# Patient Record
Sex: Female | Born: 1970 | Hispanic: Yes | Smoking: Never smoker
Health system: Southern US, Community
[De-identification: ages and names within clinical notes are randomized; demographics above are authoritative.]

## PROBLEM LIST (undated history)

## (undated) DIAGNOSIS — C50919 Malignant neoplasm of unspecified site of unspecified female breast: Secondary | ICD-10-CM

## (undated) DIAGNOSIS — E1129 Type 2 diabetes mellitus with other diabetic kidney complication: Secondary | ICD-10-CM

## (undated) DIAGNOSIS — Z794 Long term (current) use of insulin: Secondary | ICD-10-CM

## (undated) DIAGNOSIS — F32A Depression, unspecified: Secondary | ICD-10-CM

## (undated) DIAGNOSIS — G4730 Sleep apnea, unspecified: Secondary | ICD-10-CM

## (undated) DIAGNOSIS — G709 Myoneural disorder, unspecified: Secondary | ICD-10-CM

## (undated) DIAGNOSIS — I10 Essential (primary) hypertension: Secondary | ICD-10-CM

## (undated) DIAGNOSIS — Z9289 Personal history of other medical treatment: Secondary | ICD-10-CM

## (undated) DIAGNOSIS — T7840XA Allergy, unspecified, initial encounter: Secondary | ICD-10-CM

## (undated) DIAGNOSIS — N186 End stage renal disease: Secondary | ICD-10-CM

## (undated) DIAGNOSIS — D649 Anemia, unspecified: Secondary | ICD-10-CM

## (undated) DIAGNOSIS — K219 Gastro-esophageal reflux disease without esophagitis: Secondary | ICD-10-CM

## (undated) DIAGNOSIS — H269 Unspecified cataract: Secondary | ICD-10-CM

## (undated) DIAGNOSIS — T8859XA Other complications of anesthesia, initial encounter: Secondary | ICD-10-CM

## (undated) DIAGNOSIS — E785 Hyperlipidemia, unspecified: Secondary | ICD-10-CM

## (undated) DIAGNOSIS — IMO0001 Reserved for inherently not codable concepts without codable children: Secondary | ICD-10-CM

## (undated) DIAGNOSIS — I2510 Atherosclerotic heart disease of native coronary artery without angina pectoris: Secondary | ICD-10-CM

## (undated) DIAGNOSIS — Z940 Kidney transplant status: Secondary | ICD-10-CM

## (undated) DIAGNOSIS — F329 Major depressive disorder, single episode, unspecified: Secondary | ICD-10-CM

## (undated) DIAGNOSIS — F419 Anxiety disorder, unspecified: Secondary | ICD-10-CM

## (undated) DIAGNOSIS — Z992 Dependence on renal dialysis: Secondary | ICD-10-CM

## (undated) HISTORY — PX: CATARACT EXTRACTION: SUR2

## (undated) HISTORY — PX: TUBAL LIGATION: SHX77

## (undated) HISTORY — PX: OTHER SURGICAL HISTORY: SHX169

## (undated) HISTORY — DX: Allergy, unspecified, initial encounter: T78.40XA

## (undated) HISTORY — PX: ABDOMINAL HYSTERECTOMY: SHX81

## (undated) HISTORY — PX: EYE SURGERY: SHX253

## (undated) HISTORY — DX: Hyperlipidemia, unspecified: E78.5

## (undated) HISTORY — PX: DIALYSIS FISTULA CREATION: SHX611

## (undated) HISTORY — PX: CHOLECYSTECTOMY: SHX55

## (undated) HISTORY — DX: Malignant neoplasm of unspecified site of unspecified female breast: C50.919

## (undated) HISTORY — PX: CARDIAC CATHETERIZATION: SHX172

## (undated) HISTORY — DX: Unspecified cataract: H26.9

## (undated) HISTORY — PX: CYST REMOVAL NECK: SHX6281

## (undated) HISTORY — PX: BREAST SURGERY: SHX581

---

## 2000-11-09 ENCOUNTER — Emergency Department (HOSPITAL_COMMUNITY): Admission: EM | Admit: 2000-11-09 | Discharge: 2000-11-09 | Payer: Self-pay | Admitting: Emergency Medicine

## 2000-11-13 ENCOUNTER — Encounter: Payer: Self-pay | Admitting: Emergency Medicine

## 2000-11-13 ENCOUNTER — Emergency Department (HOSPITAL_COMMUNITY): Admission: EM | Admit: 2000-11-13 | Discharge: 2000-11-13 | Payer: Self-pay | Admitting: Emergency Medicine

## 2001-01-08 ENCOUNTER — Encounter: Admission: RE | Admit: 2001-01-08 | Discharge: 2001-04-08 | Payer: Self-pay | Admitting: Endocrinology

## 2005-05-03 ENCOUNTER — Ambulatory Visit (HOSPITAL_COMMUNITY): Admission: RE | Admit: 2005-05-03 | Discharge: 2005-05-03 | Payer: Self-pay | Admitting: *Deleted

## 2012-03-31 ENCOUNTER — Encounter (HOSPITAL_COMMUNITY): Payer: Self-pay | Admitting: *Deleted

## 2012-03-31 ENCOUNTER — Emergency Department (HOSPITAL_COMMUNITY)
Admission: EM | Admit: 2012-03-31 | Discharge: 2012-03-31 | Disposition: A | Discharge status: Home / Self Care | Payer: No Typology Code available for payment source | Attending: Emergency Medicine | Admitting: Emergency Medicine

## 2012-03-31 DIAGNOSIS — IMO0001 Reserved for inherently not codable concepts without codable children: Secondary | ICD-10-CM

## 2012-03-31 DIAGNOSIS — R0609 Other forms of dyspnea: Secondary | ICD-10-CM | POA: Insufficient documentation

## 2012-03-31 DIAGNOSIS — D649 Anemia, unspecified: Secondary | ICD-10-CM

## 2012-03-31 DIAGNOSIS — E119 Type 2 diabetes mellitus without complications: Secondary | ICD-10-CM | POA: Insufficient documentation

## 2012-03-31 DIAGNOSIS — Z992 Dependence on renal dialysis: Secondary | ICD-10-CM | POA: Insufficient documentation

## 2012-03-31 DIAGNOSIS — R0989 Other specified symptoms and signs involving the circulatory and respiratory systems: Secondary | ICD-10-CM | POA: Insufficient documentation

## 2012-03-31 DIAGNOSIS — I120 Hypertensive chronic kidney disease with stage 5 chronic kidney disease or end stage renal disease: Secondary | ICD-10-CM | POA: Insufficient documentation

## 2012-03-31 DIAGNOSIS — E878 Other disorders of electrolyte and fluid balance, not elsewhere classified: Secondary | ICD-10-CM

## 2012-03-31 DIAGNOSIS — K5289 Other specified noninfective gastroenteritis and colitis: Secondary | ICD-10-CM | POA: Insufficient documentation

## 2012-03-31 DIAGNOSIS — E875 Hyperkalemia: Secondary | ICD-10-CM | POA: Insufficient documentation

## 2012-03-31 DIAGNOSIS — R0689 Other abnormalities of breathing: Secondary | ICD-10-CM

## 2012-03-31 DIAGNOSIS — K529 Noninfective gastroenteritis and colitis, unspecified: Secondary | ICD-10-CM

## 2012-03-31 DIAGNOSIS — N186 End stage renal disease: Secondary | ICD-10-CM | POA: Insufficient documentation

## 2012-03-31 DIAGNOSIS — Z9089 Acquired absence of other organs: Secondary | ICD-10-CM | POA: Insufficient documentation

## 2012-03-31 DIAGNOSIS — Z794 Long term (current) use of insulin: Secondary | ICD-10-CM | POA: Insufficient documentation

## 2012-03-31 DIAGNOSIS — R112 Nausea with vomiting, unspecified: Secondary | ICD-10-CM

## 2012-03-31 HISTORY — DX: Essential (primary) hypertension: I10

## 2012-03-31 LAB — COMPREHENSIVE METABOLIC PANEL
ALT: 12 U/L (ref 0–35)
AST: 14 U/L (ref 0–37)
Alkaline Phosphatase: 82 U/L (ref 39–117)
CO2: 33 mEq/L — ABNORMAL HIGH (ref 19–32)
Calcium: 11.8 mg/dL — ABNORMAL HIGH (ref 8.4–10.5)
Chloride: 92 mEq/L — ABNORMAL LOW (ref 96–112)
GFR calc Af Amer: 4 mL/min — ABNORMAL LOW (ref >90)
GFR calc non Af Amer: 4 mL/min — ABNORMAL LOW (ref >90)
Glucose, Bld: 118 mg/dL — ABNORMAL HIGH (ref 70–99)
Potassium: 5.7 mEq/L — ABNORMAL HIGH (ref 3.5–5.1)
Sodium: 136 mEq/L (ref 135–145)

## 2012-03-31 LAB — CBC WITH DIFFERENTIAL/PLATELET
Basophils Absolute: 0 10*3/uL (ref 0.0–0.1)
Eosinophils Relative: 2 % (ref 0–5)
Lymphocytes Relative: 19 % (ref 12–46)
Lymphs Abs: 1.1 10*3/uL (ref 0.7–4.0)
MCV: 91.2 fL (ref 78.0–100.0)
Neutro Abs: 4.3 10*3/uL (ref 1.7–7.7)
Neutrophils Relative %: 72 % (ref 43–77)
Platelets: 198 10*3/uL (ref 150–400)
RBC: 3.29 MIL/uL — ABNORMAL LOW (ref 3.87–5.11)
RDW: 12.6 % (ref 11.5–15.5)
WBC: 6 10*3/uL (ref 4.0–10.5)

## 2012-03-31 MED ORDER — ONDANSETRON HCL 4 MG PO TABS: 4.0000 mg | ORAL_TABLET | Freq: Three times a day (TID) | ORAL | Status: DC | PRN

## 2012-03-31 MED ORDER — ONDANSETRON HCL 4 MG/2ML IJ SOLN
4.0000 mg | Freq: Once | INTRAMUSCULAR | Status: AC
  Administered 2012-03-31: 4 mg via INTRAVENOUS
  Filled 2012-03-31: qty 2

## 2012-03-31 NOTE — ED Provider Notes (Signed)
Medical screening examination/treatment/procedure(s) were performed by non-physician practitioner and as supervising physician I was immediately available for consultation/collaboration.  Barbara Cower, MD 03/31/12 [phone]

## 2012-03-31 NOTE — ED Provider Notes (Signed)
History     CSN: [id number]  Arrival date & time 03/31/12  0945   First MD Initiated Contact with Patient 03/31/12 1016      Chief Complaint  Patient presents with  . Emesis    (Consider location/radiation/quality/duration/timing/severity/associated sxs/prior treatment) HPI Comments: Patient presents with complain of nausea and vomiting X 2 days. Patient states that she ate some chorizo sausage and that it did not "sit right with her". Of note patient has insulin dependent diabetes but is unsure if she is type I or II. Patient also has ESRD being followed Dr. Mercy Moore at [facility]. Her last dialysis was on Saturday, her routine is Tues, Thurs, Sat. Denies fever or chills. Denies diarrhea or abdominal pain. Denies hematemesis, hematochezia, or melena.   The history is provided by the patient. No language interpreter was used.    Past Medical History  Diagnosis Date  . Diabetes mellitus   . Hypertension   . Renal disorder     t, th, sat HD    Past Surgical History  Procedure Date  . Cholecystectomy   . Abdominal hysterectomy   . Breast surgery     biopsy bilateral    No family history on file.  History  Substance Use Topics  . Smoking status: Never Smoker   . Smokeless tobacco: Not on file  . Alcohol Use: No    OB History    Grav Para Term Preterm Abortions TAB SAB Ect Mult Living                  Review of Systems  Constitutional: Negative for fever and diaphoresis.  Gastrointestinal: Positive for nausea and vomiting. Negative for abdominal pain, diarrhea and constipation.    Allergies  Review of patient's allergies indicates no known allergies.  Home Medications   Current Outpatient Rx  Name Route Sig Dispense Refill  . CALCIUM ACETATE 667 MG PO CAPS Oral Take 1,334 mg by mouth 3 (three) times daily with meals.    Marland Kitchen VITAMIN D PO Oral Take 1 tablet by mouth daily.    . INSULIN GLARGINE 100 UNIT/ML Mitchell SOLN Subcutaneous Inject 18 Units  into the skin at bedtime.    Marland Kitchen RENA-VITE PO TABS Oral Take 1 tablet by mouth daily.    Marland Kitchen NIFEDIPINE ER 90 MG PO TB24 Oral Take 90 mg by mouth daily.    Marland Kitchen SEVELAMER CARBONATE 800 MG PO TABS Oral Take 1,600 mg by mouth 3 (three) times daily with meals.    Marland Kitchen SIMVASTATIN 10 MG PO TABS Oral Take 10 mg by mouth at bedtime.    Marland Kitchen VALSARTAN 80 MG PO TABS Oral Take 80 mg by mouth daily.      BP 192/78  Pulse 90  Temp 98.4 F (36.9 C) (Oral)  Resp 18  SpO2 100%  Physical Exam  Constitutional: She appears well-developed and well-nourished. No distress.  HENT:  Head: Normocephalic and atraumatic.  Mouth/Throat: Oropharynx is clear and moist.  Eyes: Conjunctivae normal and EOM are normal.  Neck: Normal range of motion. Neck supple.  Cardiovascular: Normal rate, regular rhythm and normal heart sounds.   Pulmonary/Chest: Effort normal and breath sounds normal.  Abdominal: Soft. Bowel sounds are normal. She exhibits no distension and no mass. There is no tenderness. There is no rebound and no guarding.  Neurological: She is alert.  Skin: Skin is warm and dry.    ED Course  Procedures (including critical care time)  Labs Reviewed - No data  to display No results found. Results for orders placed during the hospital encounter of 03/31/12  CBC WITH DIFFERENTIAL      Component Value Range   WBC 6.0  4.0 - 10.5 K/uL   RBC 3.29 (*) 3.87 - 5.11 MIL/uL   Hemoglobin 10.3 (*) 12.0 - 15.0 g/dL   HCT 30.0 (*) 36.0 - 46.0 %   MCV 91.2  78.0 - 100.0 fL   MCH 31.3  26.0 - 34.0 pg   MCHC 34.3  30.0 - 36.0 g/dL   RDW 12.6  11.5 - 15.5 %   Platelets 198  150 - 400 K/uL   Neutrophils Relative 72  43 - 77 %   Neutro Abs 4.3  1.7 - 7.7 K/uL   Lymphocytes Relative 19  12 - 46 %   Lymphs Abs 1.1  0.7 - 4.0 K/uL   Monocytes Relative 7  3 - 12 %   Monocytes Absolute 0.4  0.1 - 1.0 K/uL   Eosinophils Relative 2  0 - 5 %   Eosinophils Absolute 0.1  0.0 - 0.7 K/uL   Basophils Relative 0  0 - 1 %   Basophils  Absolute 0.0  0.0 - 0.1 K/uL  COMPREHENSIVE METABOLIC PANEL      Component Value Range   Sodium 136  135 - 145 mEq/L   Potassium 5.7 (*) 3.5 - 5.1 mEq/L   Chloride 92 (*) 96 - 112 mEq/L   CO2 33 (*) 19 - 32 mEq/L   Glucose, Bld 118 (*) 70 - 99 mg/dL   BUN 58 (*) 6 - 23 mg/dL   Creatinine, Ser 11.03 (*) 0.50 - 1.10 mg/dL   Calcium 11.8 (*) 8.4 - 10.5 mg/dL   Total Protein 7.4  6.0 - 8.3 g/dL   Albumin 4.0  3.5 - 5.2 g/dL   AST 14  0 - 37 U/L   ALT 12  0 - 35 U/L   Alkaline Phosphatase 82  39 - 117 U/L   Total Bilirubin 0.2 (*) 0.3 - 1.2 mg/dL   GFR calc non Af Amer 4 (*) >90 mL/min   GFR calc Af Amer 4 (*) >90 mL/min    Date: 03/31/2012  Rate: 74  Rhythm: normal sinus rhythm  QRS Axis: normal  Intervals: normal  ST/T Wave abnormalities: normal  Conduction Disutrbances:none  Narrative Interpretation: No STEMI or signs of hyperkalemia  Old EKG Reviewed: none available    1. Nausea and vomiting   2. Gastroenteritis   3. Insulin dependent diabetes mellitus   4. ESRD (end stage renal disease)   5. Hyperkalemia   6. Hypochloremia   7. Anemia   8. Hypercarbia       MDM  Patient presented with nausea & vomiting s/p eating some sausage. Patient has IDDM and ESRD followed by Dr. Mercy Moore. Patient up to date with dialysis and will be dialyzed tomorrow. CBC: remarkable for anemia of chronic disease. CMP: hyperkalemia, hypochloremia, hypercarbic, elevated glucose, hypercalcemia, and known renal failure. EKG: unremarkable. Patient given Zofran with resolution of nausea and dizziness. Orthostatic vital signs: negative Patient discharged with Rx for Zofran and return precautions.            Annalee Genta, PA-C 03/31/12 1443

## 2012-03-31 NOTE — ED Notes (Signed)
Per pt via daughters translation, co N/V/D for 2 days.  Pt states whenever she moves she gets dizzy and throws up. Pt states she cant even turn her head without severe n/v.  Pt has vomitted 4 times in last 24 hrs.  Pt has had no fluids for 24hrs.  Pt states she has intermittent cramping where she feels the need to have bm.  When she gets this cramping she states she will vomit.  LBM this am and was normal.  Has had some off and on diarrhea. Pt has history of renal failure.  Pt alert oriented X4

## 2012-03-31 NOTE — ED Notes (Signed)
Pt here with nausea/vomitng for last 2 days and pt is on dialysis.  Denies constipation.  Pt reports stomach cramping.  Pt sts everytime she rolls over in bed gets dizzy and vomits.  No headache now.  Pt sts dizziness started first 2 days ago

## 2012-08-21 ENCOUNTER — Encounter (HOSPITAL_COMMUNITY): Payer: Self-pay | Admitting: *Deleted

## 2012-08-21 ENCOUNTER — Emergency Department (HOSPITAL_COMMUNITY)
Admission: EM | Admit: 2012-08-21 | Discharge: 2012-08-22 | Disposition: A | Discharge status: Home / Self Care | Payer: No Typology Code available for payment source | Attending: Emergency Medicine | Admitting: Emergency Medicine

## 2012-08-21 DIAGNOSIS — R112 Nausea with vomiting, unspecified: Secondary | ICD-10-CM | POA: Insufficient documentation

## 2012-08-21 DIAGNOSIS — I10 Essential (primary) hypertension: Secondary | ICD-10-CM | POA: Insufficient documentation

## 2012-08-21 DIAGNOSIS — Z794 Long term (current) use of insulin: Secondary | ICD-10-CM | POA: Insufficient documentation

## 2012-08-21 DIAGNOSIS — R1110 Vomiting, unspecified: Secondary | ICD-10-CM

## 2012-08-21 DIAGNOSIS — Z3202 Encounter for pregnancy test, result negative: Secondary | ICD-10-CM | POA: Insufficient documentation

## 2012-08-21 DIAGNOSIS — E119 Type 2 diabetes mellitus without complications: Secondary | ICD-10-CM | POA: Insufficient documentation

## 2012-08-21 DIAGNOSIS — Z79899 Other long term (current) drug therapy: Secondary | ICD-10-CM | POA: Insufficient documentation

## 2012-08-21 DIAGNOSIS — Z87448 Personal history of other diseases of urinary system: Secondary | ICD-10-CM | POA: Insufficient documentation

## 2012-08-21 MED ORDER — ONDANSETRON HCL 4 MG/2ML IJ SOLN
4.0000 mg | Freq: Once | INTRAMUSCULAR | Status: AC
  Administered 2012-08-22: 4 mg via INTRAVENOUS
  Filled 2012-08-21: qty 2

## 2012-08-21 MED ORDER — SODIUM CHLORIDE 0.9 % IV BOLUS (SEPSIS)
1000.0000 mL | Freq: Once | INTRAVENOUS | Status: AC
  Administered 2012-08-22: 1000 mL via INTRAVENOUS

## 2012-08-21 NOTE — ED Notes (Addendum)
Pt reports developing nausea and vomiting last night. States that she has thrown up 7 times today. States she was dialyzed today and was given an antiemetic with no relief. Pt reports not checking her blood sugar for two days. Pt has a history of ESRD and DMII. Pt reports last emesis was an hour prior to arrival to the ER. Pt has an A/V fistula graft on left upper arm.

## 2012-08-21 NOTE — ED Provider Notes (Signed)
History  Cynthia chart was scribed for Cynthia Rice, MD by Cynthia Cynthia [name], ED Scribe. Cynthia Cynthia [name] was seen in room D32C/D32C and the Cynthia [name]'s care was started at 11:39 PM.  CSN: [id number]  Arrival date & time 08/21/12  2326   First MD Initiated Contact with Cynthia [name] 08/21/12 2339      Chief Complaint  Cynthia [name] presents with  . Emesis    Cynthia [name] is a 42 y.o. female presenting with vomiting. The history is provided by the Cynthia [name]. No language interpreter was used.  Emesis Duration:  1 day Quality:  Stomach contents Able to tolerate:  Liquids Chronicity:  New Context: not post-tussive   Associated symptoms: no abdominal pain, no chills and no diarrhea   Risk factors: no sick contacts     Cynthia Cynthia [name] is a 42 y.o. female who presents to the Emergency Department complaining of 7 episodes of emesis since last night. She reports that she is currently nauseated and states that she has tried to eat but vomited after eating. She reports that her last BM was approximately at 6 PM tonight but was harder than normal. She denies having any sick contacts with similar symptoms. She denies diarrhea, urinary symptoms and vaginal complaints. She has a h/o DM and HTN and denies smoking and alcohol use.  Past Medical History  Diagnosis Date  . Diabetes mellitus   . Hypertension   . Renal disorder     t, th, sat HD    Past Surgical History  Procedure Laterality Date  . Cholecystectomy    . Abdominal hysterectomy    . Breast surgery      biopsy bilateral    No family history on file.  History  Substance Use Topics  . Smoking status: Never Smoker   . Smokeless tobacco: Not on file  . Alcohol Use: No    No OB history provided.  Review of Systems  Constitutional: Negative for fever and chills.  Gastrointestinal: Positive for nausea and vomiting. Negative for abdominal pain and diarrhea.  Genitourinary: Negative for dysuria, frequency, vaginal bleeding and vaginal discharge.  All  other systems reviewed and are negative.    Allergies  Review of Cynthia [name]'s allergies indicates no known allergies.  Home Medications   Current Outpatient Rx  Name  Route  Sig  Dispense  Refill  . calcium acetate (PHOSLO) 667 MG capsule   Oral   Take 1,334 mg by mouth 3 (three) times daily with meals.         . Cholecalciferol (VITAMIN D PO)   Oral   Take 1 tablet by mouth daily.         . insulin glargine (LANTUS) 100 UNIT/ML injection   Subcutaneous   Inject 18 Units into the skin at bedtime.         . multivitamin (RENA-VIT) TABS tablet   Oral   Take 1 tablet by mouth daily.         Marland Kitchen NIFEdipine (ADALAT CC) 90 MG 24 hr tablet   Oral   Take 90 mg by mouth daily.         . promethazine (PHENERGAN) 25 MG suppository   Rectal   Place 25 mg rectally every 6 (six) hours as needed for nausea.         . sevelamer (RENVELA) 800 MG tablet   Oral   Take 1,600 mg by mouth 3 (three) times daily with meals.         . valsartan (DIOVAN) 80 MG tablet  Oral   Take 80 mg by mouth daily.         . ondansetron (ZOFRAN ODT) 4 MG disintegrating tablet      4mg  ODT q4 hours prn nausea/vomit   20 tablet   0     Triage Vitals: BP 189/80  Pulse 88  Temp(Src) 98.2 F (36.8 C) (Oral)  Resp 18  SpO2 98%  Physical Exam  Nursing note and vitals reviewed. Constitutional: She is oriented to person, place, and time. She appears well-developed and well-nourished. No distress.  HENT:  Head: Normocephalic and atraumatic.  Eyes: EOM are normal.  Neck: Neck supple. No tracheal deviation present.  Cardiovascular: Normal rate.   Pulmonary/Chest: Effort normal. No respiratory distress.  Musculoskeletal: Normal range of motion.  Neurological: She is alert and oriented to person, place, and time.  Skin: Skin is warm and dry.  Psychiatric: She has a normal mood and affect. Her behavior is normal.    ED Course  Procedures (including critical care time)  DIAGNOSTIC  STUDIES: Oxygen Saturation is 98% on room air, normal by my interpretation.    COORDINATION OF CARE: 11:54 PM-Discussed treatment plan which includes IV fluids, antiemetic, CBC panel, and UA with pt at bedside and pt agreed to plan.   12:00 AM- Ordered 1,000 mL of bolus and 4 mg Zofran injection  1:30 AM-Pt rechecked and feels improved with medications listed above. Will try PO challenge.  2:40 AM- Pt tolerated PO challenge well. Will discharge home with instructions to f/u with PCP and to return for worsening of symptoms, inability to tolerate POs or development of concerning symptoms. Pt is agreeable to Cynthia plan.  Labs Reviewed  GLUCOSE, CAPILLARY - Abnormal; Notable for the following:    Glucose-Capillary 133 (*)    All other components within normal limits  COMPREHENSIVE METABOLIC PANEL - Abnormal; Notable for the following:    Potassium 5.2 (*)    Chloride 92 (*)    CO2 35 (*)    Glucose, Bld 137 (*)    BUN 32 (*)    Creatinine, Ser 7.22 (*)    GFR calc non Af Amer 6 (*)    GFR calc Af Amer 7 (*)    All other components within normal limits  CBC WITH DIFFERENTIAL - Abnormal; Notable for the following:    RBC 3.59 (*)    Hemoglobin 11.4 (*)    HCT 33.7 (*)    Platelets 131 (*)    Neutrophils Relative 82 (*)    Lymphocytes Relative 11 (*)    All other components within normal limits  URINALYSIS, ROUTINE W REFLEX MICROSCOPIC - Abnormal; Notable for the following:    APPearance CLOUDY (*)    pH 8.5 (*)    Glucose, UA 250 (*)    Hgb urine dipstick SMALL (*)    Protein, ur >300 (*)    All other components within normal limits  URINE MICROSCOPIC-ADD ON - Abnormal; Notable for the following:    Squamous Epithelial / LPF FEW (*)    Bacteria, UA MANY (*)    All other components within normal limits  LIPASE, BLOOD  PREGNANCY, URINE   No results found.   1. Vomiting      Date: 08/22/2012  Rate: 84  Rhythm: normal sinus rhythm  QRS Axis: normal  Intervals: normal   ST/T Wave abnormalities: normal  Conduction Disutrbances:none  Narrative Interpretation:   Old EKG Reviewed: none available    MDM  I personally performed the services  described in Cynthia documentation, which was scribed in my presence. The recorded information has been reviewed and is accurate.       Cynthia Rice, MD 08/22/12 0400

## 2012-08-22 LAB — COMPREHENSIVE METABOLIC PANEL
Alkaline Phosphatase: 85 U/L (ref 39–117)
BUN: 32 mg/dL — ABNORMAL HIGH (ref 6–23)
Chloride: 92 mEq/L — ABNORMAL LOW (ref 96–112)
Creatinine, Ser: 7.22 mg/dL — ABNORMAL HIGH (ref 0.50–1.10)
GFR calc Af Amer: 7 mL/min — ABNORMAL LOW (ref >90)
Glucose, Bld: 137 mg/dL — ABNORMAL HIGH (ref 70–99)
Potassium: 5.2 mEq/L — ABNORMAL HIGH (ref 3.5–5.1)
Total Bilirubin: 0.3 mg/dL (ref 0.3–1.2)
Total Protein: 7 g/dL (ref 6.0–8.3)

## 2012-08-22 LAB — CBC WITH DIFFERENTIAL/PLATELET
Basophils Absolute: 0 10*3/uL (ref 0.0–0.1)
Lymphocytes Relative: 11 % — ABNORMAL LOW (ref 12–46)
Lymphs Abs: 0.7 10*3/uL (ref 0.7–4.0)
Neutro Abs: 5.3 10*3/uL (ref 1.7–7.7)
Platelets: 131 10*3/uL — ABNORMAL LOW (ref 150–400)
RBC: 3.59 MIL/uL — ABNORMAL LOW (ref 3.87–5.11)
RDW: 14.4 % (ref 11.5–15.5)
WBC: 6.4 10*3/uL (ref 4.0–10.5)

## 2012-08-22 LAB — URINALYSIS, ROUTINE W REFLEX MICROSCOPIC
Leukocytes, UA: NEGATIVE
Nitrite: NEGATIVE
Specific Gravity, Urine: 1.01 (ref 1.005–1.030)
Urobilinogen, UA: 0.2 mg/dL (ref 0.0–1.0)

## 2012-08-22 LAB — URINE MICROSCOPIC-ADD ON

## 2012-08-22 LAB — PREGNANCY, URINE: Preg Test, Ur: NEGATIVE

## 2012-08-22 LAB — LIPASE, BLOOD: Lipase: 20 U/L (ref 11–59)

## 2012-08-22 MED ORDER — ONDANSETRON 4 MG PO TBDP: ORAL_TABLET | ORAL | Status: DC

## 2012-09-19 DIAGNOSIS — Z7682 Awaiting organ transplant status: Secondary | ICD-10-CM | POA: Insufficient documentation

## 2012-11-24 ENCOUNTER — Ambulatory Visit (INDEPENDENT_AMBULATORY_CARE_PROVIDER_SITE_OTHER): Payer: Medicare Other | Admitting: Family Medicine

## 2012-11-24 ENCOUNTER — Encounter (HOSPITAL_COMMUNITY): Payer: Self-pay | Admitting: Emergency Medicine

## 2012-11-24 VITALS — BP 222/68 | HR 86 | Temp 97.7°F | Resp 16 | Ht 60.5 in | Wt 169.0 lb

## 2012-11-24 DIAGNOSIS — N186 End stage renal disease: Secondary | ICD-10-CM | POA: Diagnosis present

## 2012-11-24 DIAGNOSIS — Z992 Dependence on renal dialysis: Secondary | ICD-10-CM

## 2012-11-24 DIAGNOSIS — E878 Other disorders of electrolyte and fluid balance, not elsewhere classified: Secondary | ICD-10-CM | POA: Diagnosis present

## 2012-11-24 DIAGNOSIS — I120 Hypertensive chronic kidney disease with stage 5 chronic kidney disease or end stage renal disease: Principal | ICD-10-CM | POA: Diagnosis present

## 2012-11-24 DIAGNOSIS — E875 Hyperkalemia: Secondary | ICD-10-CM | POA: Diagnosis present

## 2012-11-24 DIAGNOSIS — I10 Essential (primary) hypertension: Secondary | ICD-10-CM | POA: Insufficient documentation

## 2012-11-24 DIAGNOSIS — IMO0002 Reserved for concepts with insufficient information to code with codable children: Secondary | ICD-10-CM

## 2012-11-24 DIAGNOSIS — E1129 Type 2 diabetes mellitus with other diabetic kidney complication: Secondary | ICD-10-CM | POA: Diagnosis present

## 2012-11-24 DIAGNOSIS — I160 Hypertensive urgency: Secondary | ICD-10-CM

## 2012-11-24 DIAGNOSIS — IMO0001 Reserved for inherently not codable concepts without codable children: Secondary | ICD-10-CM | POA: Insufficient documentation

## 2012-11-24 DIAGNOSIS — Z01419 Encounter for gynecological examination (general) (routine) without abnormal findings: Secondary | ICD-10-CM

## 2012-11-24 DIAGNOSIS — Z794 Long term (current) use of insulin: Secondary | ICD-10-CM

## 2012-11-24 DIAGNOSIS — E1165 Type 2 diabetes mellitus with hyperglycemia: Secondary | ICD-10-CM

## 2012-11-24 DIAGNOSIS — N2581 Secondary hyperparathyroidism of renal origin: Secondary | ICD-10-CM | POA: Diagnosis present

## 2012-11-24 DIAGNOSIS — E118 Type 2 diabetes mellitus with unspecified complications: Secondary | ICD-10-CM

## 2012-11-24 DIAGNOSIS — Z1272 Encounter for screening for malignant neoplasm of vagina: Secondary | ICD-10-CM

## 2012-11-24 LAB — CBC WITH DIFFERENTIAL/PLATELET
Eosinophils Absolute: 0.3 10*3/uL (ref 0.0–0.7)
Lymphs Abs: 1.6 10*3/uL (ref 0.7–4.0)
MCH: 33.2 pg (ref 26.0–34.0)
Neutrophils Relative %: 65 % (ref 43–77)
Platelets: 170 10*3/uL (ref 150–400)
RBC: 3.22 MIL/uL — ABNORMAL LOW (ref 3.87–5.11)
WBC: 6.5 10*3/uL (ref 4.0–10.5)

## 2012-11-24 MED ORDER — INSULIN GLARGINE 100 UNIT/ML SOLOSTAR PEN: 18.0000 [IU] | PEN_INJECTOR | Freq: Every day | SUBCUTANEOUS | Status: DC

## 2012-11-24 NOTE — Progress Notes (Signed)
[facility] [address] [place] [address] [phone]  Date:  11/24/2012   Name:  Cynthia [name]   DOB:  1971/04/08   MRN:  [id number]  PCP:  Default, Provider, MD    Chief Complaint: Annual Exam and Medication Refill   History of Present Illness:  Cynthia [name] is a 42 y.o. very pleasant female patient who presents with the following:  Here today as a new patient.  She has IDDM.   BP at ED in February of this year- 189/ 80. She had been a pt in [place] but this clinic has closed.  She has her records at home but did not bring them.   She forgot her BP medication this am- she is on 2 medications but did not take either today.  She last had dialysis on Friday (today is Monday)- missed her Saturday dialysis.   Her last pap was last year she thinks.   She is also on dialysis- this is why she is on medicare. Her renal disorder is thought to be due to her DM.   Her nephrologist is Dr. Mercy [name].  She last saw him 2 weeks ago.  She reports that he checks her A1c and that it has looked ok recently.   Also reports that her BP was 150/ ? When she last saw her nephrologist.    She is s/p hysterectomy.  She is not sure if she still has her cervix.   Hysterectomy done due to fibroids- not for cancer.   Apparently last year she was told that her pap was abnormal and that she needed q 6 month recheck.    She reports she has noted a HA in the morning for the last several days- it goes away after she takes tylenol.    There are no active problems to display for this patient.   Past Medical History  Diagnosis Date  . Diabetes mellitus   . Hypertension   . Renal disorder     t, th, sat HD    Past Surgical History  Procedure Laterality Date  . Cholecystectomy    . Abdominal hysterectomy    . Breast surgery      biopsy bilateral    History  Substance Use Topics  . Smoking status: Never Smoker   . Smokeless tobacco: Not on file  . Alcohol Use: No     No family history on file.  No Known Allergies  Medication list has been reviewed and updated.  Current Outpatient Prescriptions on File Prior to Visit  Medication Sig Dispense Refill  . calcium acetate (PHOSLO) 667 MG capsule Take 1,334 mg by mouth 3 (three) times daily with meals.      . Cholecalciferol (VITAMIN D PO) Take 1 tablet by mouth daily.      . insulin glargine (LANTUS) 100 UNIT/ML injection Inject 18 Units into the skin at bedtime.      . multivitamin (RENA-VIT) TABS tablet Take 1 tablet by mouth daily.      Marland Kitchen NIFEdipine (ADALAT CC) 90 MG 24 hr tablet Take 90 mg by mouth daily.      . sevelamer (RENVELA) 800 MG tablet Take 1,600 mg by mouth 3 (three) times daily with meals.      . valsartan (DIOVAN) 80 MG tablet Take 80 mg by mouth daily.      . ondansetron (ZOFRAN ODT) 4 MG disintegrating tablet 4mg  ODT q4 hours prn nausea/vomit  20 tablet  0  .  promethazine (PHENERGAN) 25 MG suppository Place 25 mg rectally every 6 (six) hours as needed for nausea.       No current facility-administered medications on file prior to visit.    Review of Systems:  As per HPI- otherwise negative.   Physical Examination: Filed Vitals:   11/24/12 1609  BP: 200/78  Pulse: 86  Temp: 97.7 F (36.5 C)  Resp: 16   Filed Vitals:   11/24/12 1609  Height: 5' 0.5" (1.537 m)  Weight: 169 lb (76.658 kg)   Body mass index is 32.45 kg/(m^2). Ideal Body Weight: Weight in (lb) to have BMI = 25: 129.9  GEN: WDWN, NAD, Non-toxic, A & O x 3, obese HEENT: Atraumatic, Normocephalic. Neck supple. No masses, No LAD. Ears and Nose: No external deformity. CV: RRR, No M/G/R. No JVD. No thrill. No extra heart sounds. PULM: CTA B, no wheezes, crackles, rhonchi. No retractions. No resp. distress. No accessory muscle use. ABD: S, NT, ND, +BS. No rebound. No HSM. Dialysis fistula in left arm.   EXTR: No c/c/e NEURO Normal gait.  PSYCH: Normally interactive. Conversant. Not depressed or anxious  appearing.  Calm demeanor.  GU: normal post hysterectomy exam.  No cervix visualized.  Did pap of vaginal cuff due to her report of an abnormal pap last year. No adnexal tenderness or masses   Called to discuss with nephrology on call due to very high BP.  Treated with clonidine 0.1 once.  Her BP went up to 222/68.  Decided to refer to ED for hypertensive urgency  Assessment and Plan: Screening for vaginal cancer - Plan: Pap IG and HPV (high risk) DNA detection  Type II or unspecified type diabetes mellitus with unspecified complication, uncontrolled - Plan: Insulin Glargine (LANTUS SOLOSTAR) 100 UNIT/ML SOPN  Hypertensive urgency  Performed pap today, follow up with results.  Gave resources to schedule her mammogram, refilled her lantus.   Referral to [facility] ER today for hypertensive urgency. They will drive to the ER now- her husband is with her.    Signed Lamar Blinks, MD

## 2012-11-24 NOTE — Patient Instructions (Addendum)
Your blood pressure is much too high today.  We need to get your blood pressure down.  Please go to the ER at [facility] for further treatment.  You may also need to be have dialysis.    We will be in touch with you regarding your pap result.  If you could please get me your last pap result that would be great.    You can schedule your own mammogram at your convenience.  Some resources in [place] are   [facility] [place] 7 a.m.-6 p.m., Monday 7 a.m.-5 p.m., Tuesday-Friday  Schedule an appointment online or call [phone]. Virtually visit [facility]. Directions  [place], [place] [address] - See more at: [email or web address]  [facility] on [place]. [address], [place], [place] [address] Phone: [phone]  Toll Free: [phone]  Fax: [phone]

## 2012-11-24 NOTE — ED Notes (Signed)
PT. REPORTS NAUSEA/EMESIS TODAY , SEEN AT [facility] DIAGNOSED WITH HYPERTENSION 222/60 , LAST HEMODIALYSIS LAST Friday . RESPIRATIONS UNLABORED .

## 2012-11-25 ENCOUNTER — Encounter: Payer: Self-pay | Admitting: Family Medicine

## 2012-11-25 ENCOUNTER — Emergency Department (HOSPITAL_COMMUNITY): Payer: Medicare Other

## 2012-11-25 ENCOUNTER — Inpatient Hospital Stay (HOSPITAL_COMMUNITY)
Admission: EM | Admit: 2012-11-25 | Discharge: 2012-11-25 | DRG: 682 | Disposition: A | Discharge status: Home / Self Care | Payer: Medicare Other | Attending: Family Medicine | Admitting: Family Medicine

## 2012-11-25 DIAGNOSIS — R110 Nausea: Secondary | ICD-10-CM

## 2012-11-25 DIAGNOSIS — N186 End stage renal disease: Secondary | ICD-10-CM | POA: Diagnosis present

## 2012-11-25 DIAGNOSIS — IMO0001 Reserved for inherently not codable concepts without codable children: Secondary | ICD-10-CM

## 2012-11-25 DIAGNOSIS — E875 Hyperkalemia: Secondary | ICD-10-CM | POA: Diagnosis present

## 2012-11-25 DIAGNOSIS — Z992 Dependence on renal dialysis: Secondary | ICD-10-CM | POA: Diagnosis present

## 2012-11-25 DIAGNOSIS — I10 Essential (primary) hypertension: Secondary | ICD-10-CM | POA: Diagnosis present

## 2012-11-25 DIAGNOSIS — R112 Nausea with vomiting, unspecified: Secondary | ICD-10-CM

## 2012-11-25 LAB — COMPREHENSIVE METABOLIC PANEL
ALT: 13 U/L (ref 0–35)
Albumin: 3.8 g/dL (ref 3.5–5.2)
Alkaline Phosphatase: 79 U/L (ref 39–117)
Glucose, Bld: 140 mg/dL — ABNORMAL HIGH (ref 70–99)
Potassium: 6.4 mEq/L (ref 3.5–5.1)
Sodium: 136 mEq/L (ref 135–145)
Total Protein: 7.1 g/dL (ref 6.0–8.3)

## 2012-11-25 LAB — RENAL FUNCTION PANEL
CO2: 28 mEq/L (ref 19–32)
Calcium: 11.4 mg/dL — ABNORMAL HIGH (ref 8.4–10.5)
Creatinine, Ser: 14.05 mg/dL — ABNORMAL HIGH (ref 0.50–1.10)
Glucose, Bld: 95 mg/dL (ref 70–99)

## 2012-11-25 LAB — HEMOGLOBIN A1C: Mean Plasma Glucose: 111 mg/dL (ref <117)

## 2012-11-25 LAB — TROPONIN I: Troponin I: <0.3 ng/mL (ref <0.30)

## 2012-11-25 LAB — PAP IG AND HPV HIGH-RISK

## 2012-11-25 LAB — CBC
Hemoglobin: 9.3 g/dL — ABNORMAL LOW (ref 12.0–15.0)
MCH: 33.1 pg (ref 26.0–34.0)
MCV: 94 fL (ref 78.0–100.0)
RBC: 2.81 MIL/uL — ABNORMAL LOW (ref 3.87–5.11)

## 2012-11-25 LAB — GLUCOSE, CAPILLARY
Glucose-Capillary: 101 mg/dL — ABNORMAL HIGH (ref 70–99)
Glucose-Capillary: 138 mg/dL — ABNORMAL HIGH (ref 70–99)

## 2012-11-25 LAB — PRO B NATRIURETIC PEPTIDE: Pro B Natriuretic peptide (BNP): 6670 pg/mL — ABNORMAL HIGH (ref 0–125)

## 2012-11-25 MED ORDER — SEVELAMER CARBONATE 800 MG PO TABS
1600.0000 mg | ORAL_TABLET | Freq: Three times a day (TID) | ORAL | Status: DC
  Administered 2012-11-25 (×2): 1600 mg via ORAL
  Filled 2012-11-25 (×4): qty 2

## 2012-11-25 MED ORDER — NEPRO/CARBSTEADY PO LIQD: 237.0000 mL | ORAL | Status: DC | PRN

## 2012-11-25 MED ORDER — INSULIN ASPART 100 UNIT/ML ~~LOC~~ SOLN
10.0000 [IU] | Freq: Once | SUBCUTANEOUS | Status: AC
  Administered 2012-11-25: 10 [IU] via INTRAVENOUS
  Filled 2012-11-25: qty 1

## 2012-11-25 MED ORDER — ONDANSETRON HCL 4 MG/2ML IJ SOLN: 4.0000 mg | Freq: Four times a day (QID) | INTRAMUSCULAR | Status: DC | PRN

## 2012-11-25 MED ORDER — INSULIN ASPART 100 UNIT/ML ~~LOC~~ SOLN: 0.0000 [IU] | Freq: Three times a day (TID) | SUBCUTANEOUS | Status: DC

## 2012-11-25 MED ORDER — CALCIUM ACETATE 667 MG PO CAPS
1334.0000 mg | ORAL_CAPSULE | Freq: Three times a day (TID) | ORAL | Status: DC
  Administered 2012-11-25 (×2): 1334 mg via ORAL
  Filled 2012-11-25 (×4): qty 2

## 2012-11-25 MED ORDER — HEPARIN SODIUM (PORCINE) 1000 UNIT/ML IJ SOLN: 4000.0000 [IU] | Freq: Once | INTRAMUSCULAR | Status: DC

## 2012-11-25 MED ORDER — SODIUM CHLORIDE 0.9 % IV SOLN: 100.0000 mL | INTRAVENOUS | Status: DC | PRN

## 2012-11-25 MED ORDER — ACETAMINOPHEN 325 MG PO TABS: 650.0000 mg | ORAL_TABLET | Freq: Four times a day (QID) | ORAL | Status: DC | PRN

## 2012-11-25 MED ORDER — NIFEDIPINE ER OSMOTIC RELEASE 90 MG PO TB24
90.0000 mg | ORAL_TABLET | Freq: Every day | ORAL | Status: DC
  Administered 2012-11-25: 90 mg via ORAL
  Filled 2012-11-25: qty 1

## 2012-11-25 MED ORDER — LABETALOL HCL 5 MG/ML IV SOLN
10.0000 mg | Freq: Once | INTRAVENOUS | Status: AC
  Administered 2012-11-25: 10 mg via INTRAVENOUS
  Filled 2012-11-25: qty 4

## 2012-11-25 MED ORDER — PENTAFLUOROPROP-TETRAFLUOROETH EX AERO: 1.0000 "application " | INHALATION_SPRAY | CUTANEOUS | Status: DC | PRN

## 2012-11-25 MED ORDER — DOXERCALCIFEROL 4 MCG/2ML IV SOLN: 4.0000 ug | INTRAVENOUS | Status: DC

## 2012-11-25 MED ORDER — RENA-VITE PO TABS
1.0000 | ORAL_TABLET | Freq: Every day | ORAL | Status: DC
  Filled 2012-11-25: qty 1

## 2012-11-25 MED ORDER — ACETAMINOPHEN 650 MG RE SUPP: 650.0000 mg | Freq: Four times a day (QID) | RECTAL | Status: DC | PRN

## 2012-11-25 MED ORDER — HEPARIN SODIUM (PORCINE) 1000 UNIT/ML DIALYSIS
1000.0000 [IU] | INTRAMUSCULAR | Status: DC | PRN
  Filled 2012-11-25: qty 1

## 2012-11-25 MED ORDER — SODIUM POLYSTYRENE SULFONATE 15 GM/60ML PO SUSP
45.0000 g | Freq: Once | ORAL | Status: AC
  Administered 2012-11-25: 45 g via ORAL
  Filled 2012-11-25: qty 180

## 2012-11-25 MED ORDER — DEXTROSE 50 % IV SOLN
1.0000 | Freq: Once | INTRAVENOUS | Status: AC
  Administered 2012-11-25: 50 mL via INTRAVENOUS
  Filled 2012-11-25: qty 50

## 2012-11-25 MED ORDER — ONDANSETRON HCL 4 MG/2ML IJ SOLN
4.0000 mg | Freq: Once | INTRAMUSCULAR | Status: AC
  Administered 2012-11-25: 4 mg via INTRAVENOUS
  Filled 2012-11-25: qty 2

## 2012-11-25 MED ORDER — LIDOCAINE HCL (PF) 1 % IJ SOLN: 5.0000 mL | INTRAMUSCULAR | Status: DC | PRN

## 2012-11-25 MED ORDER — LIDOCAINE-PRILOCAINE 2.5-2.5 % EX CREA: 1.0000 "application " | TOPICAL_CREAM | CUTANEOUS | Status: DC | PRN

## 2012-11-25 MED ORDER — SODIUM CHLORIDE 0.9 % IJ SOLN
3.0000 mL | Freq: Two times a day (BID) | INTRAMUSCULAR | Status: DC
  Administered 2012-11-25: 3 mL via INTRAVENOUS

## 2012-11-25 MED ORDER — SODIUM CHLORIDE 0.9 % IJ SOLN: 3.0000 mL | INTRAMUSCULAR | Status: DC | PRN

## 2012-11-25 MED ORDER — LABETALOL HCL 5 MG/ML IV SOLN
10.0000 mg | INTRAVENOUS | Status: DC | PRN
  Filled 2012-11-25: qty 4

## 2012-11-25 MED ORDER — ONDANSETRON HCL 4 MG PO TABS: 4.0000 mg | ORAL_TABLET | Freq: Four times a day (QID) | ORAL | Status: DC | PRN

## 2012-11-25 MED ORDER — INSULIN GLARGINE 100 UNIT/ML ~~LOC~~ SOLN
15.0000 [IU] | Freq: Every day | SUBCUTANEOUS | Status: DC
  Filled 2012-11-25: qty 0.15

## 2012-11-25 MED ORDER — CHOLECALCIFEROL 10 MCG (400 UNIT) PO TABS
400.0000 [IU] | ORAL_TABLET | Freq: Every day | ORAL | Status: DC
  Administered 2012-11-25: 400 [IU] via ORAL
  Filled 2012-11-25: qty 1

## 2012-11-25 MED ORDER — SODIUM CHLORIDE 0.9 % IV SOLN: 250.0000 mL | INTRAVENOUS | Status: DC | PRN

## 2012-11-25 MED ORDER — HEPARIN SODIUM (PORCINE) 5000 UNIT/ML IJ SOLN
5000.0000 [IU] | Freq: Three times a day (TID) | INTRAMUSCULAR | Status: DC
  Administered 2012-11-25: 5000 [IU] via SUBCUTANEOUS
  Filled 2012-11-25 (×3): qty 1

## 2012-11-25 MED ORDER — DIPHENHYDRAMINE HCL 50 MG/ML IJ SOLN
12.5000 mg | Freq: Once | INTRAMUSCULAR | Status: AC
  Administered 2012-11-25: 12.5 mg via INTRAVENOUS
  Filled 2012-11-25: qty 1

## 2012-11-25 MED ORDER — SODIUM CHLORIDE 0.9 % IJ SOLN: 3.0000 mL | Freq: Two times a day (BID) | INTRAMUSCULAR | Status: DC

## 2012-11-25 MED ORDER — HEPARIN SODIUM (PORCINE) 1000 UNIT/ML DIALYSIS
4000.0000 [IU] | Freq: Once | INTRAMUSCULAR | Status: AC
  Administered 2012-11-25: 4000 [IU] via INTRAVENOUS_CENTRAL
  Filled 2012-11-25: qty 4

## 2012-11-25 MED ORDER — ALTEPLASE 2 MG IJ SOLR
2.0000 mg | Freq: Once | INTRAMUSCULAR | Status: DC | PRN
  Filled 2012-11-25: qty 2

## 2012-11-25 NOTE — Progress Notes (Signed)
[facility]MTS Daily Intern Progress Note  Subjective: Feeels better this morning with no more nausea/vomiting/abdominal discomfort. Denies Chest pain or SOB. In HD this AM.  I have reviewed the patient's medications.  Objective Temp:  [97 F (36.1 C)-98.4 F (36.9 C)] 97 F (36.1 C) (06/03 0858) Pulse Rate:  [69-86] 69 (06/03 1200) Resp:  [10-17] 11 (06/03 1200) BP: (146-222)/(63-111) 160/84 mmHg (06/03 1200) SpO2:  [90 %-100 %] 99 % (06/03 1200) Weight:  [167 lb 15.9 oz (76.2 kg)-169 lb (76.658 kg)] 167 lb 15.9 oz (76.2 kg) (06/03 0858)   Intake/Output Summary (Last 24 hours) at 11/25/12 1232 Last data filed at 11/25/12 0659  Gross per 24 hour  Intake      0 ml  Output      0 ml  Net      0 ml    CBG (last 3)   Recent Labs  11/25/12 0656  GLUCAP 96    General: NAD, pleasant, in HD HEENT: AT/[place], EOMI, sclera clear, MMM CV: RRR, III/VI holosystolic murmur unchanged from admission, left arm with large grafted vein for HD Pulm: CTAB with no wheezes or crackles, normal effort Abd: soft, nontender, nondistended, NABS Ext: No edema or cyanosis Neuro: Awake, alert, no focal deficit, normal speech  Labs and Imaging  Recent Labs Lab 11/24/12 2322 11/25/12 0908  WBC 6.5 5.7  HGB 10.7* 9.3*  HCT 30.3* 26.4*  PLT 170 157     Recent Labs Lab 11/24/12 2322 11/25/12 0530  NA 136 139  K 6.4* 5.4*  CL 91* 94*  CO2 31 28  BUN 93* 98*  CREATININE 13.40* 14.05*  GLUCOSE 140* 95  CALCIUM 11.2* 11.4*   11/25/12 Chest xray: IMPRESSION:  1. No radiographic evidence of acute cardiopulmonary disease.  11/25/12 EKG: NSR with mild peaking of T waves  Assessment and Plan Clo Aldi is a 42 y.o. year old female with IDDM, ESRD and HTN presenting with hypertensive urgency and a 1 day history of nausea/vomiting, likely due to missing hemodialysis and uremia.  # Hypertensive urgency: Likely multifactorial from missing medication and missing Saturday dialysis. No sign of end-organ  damage. She responded well to labetalol in the ED.  - continued home nifedipine  - Holding home valsartan as I'm not sure this will do much given her ESRD  - labetalol 10mg  q2hr prn for BP > 180/110   # Nausea and vomiting: Likely related to mild uremia from missed dialysis. BUN elevated at 93 on admission, 98 today prior to HD. No fever, severe abdominal pain, diarrhea or white count to suggest infection.  - zofran prn  - Renal performing HD today with goal to remove 3L   # Hyperkalemia: 6.4 on admission-->5.4 today. Likely secondary to missed dialysis. S/p dextrose/insulin and kayexalate in the ED. EKG with mildly prominent T-waves.  - Hemodialysis today - Recheck BMET tomorrow  # New murmur: Will relisten to patient today and look into any previous workup. Pt stable.  # ESRD: Typically on TTS schedule but received dialysis Thursday and Friday last week. Missed Saturday dialysis. Followed by Dr. Mercy Moore.  - hyperkalemia, possible mild uremia with nausea/vomiting, mild volume overload with subjective orthopnea  - HD this morning with goal to remove 3L  # DM: on Lantus only at home.  - f/u A1c - Lantus 15 units qHS  - sensitive SSI   # Anemia: Normocytic, likely from chronic renal disease. Unknown baseline. Asymptomatic.  - continue to monitor   FEN/GI: renal diet, SLIV  Prophylaxis: SQ heparin  Disposition: Discharge today pending stable after HD with close PCP f/u and continued TTS HD   Conni Slipper MD Service Pager: [phone] Text pages welcome.  Can page through [email or web address], [place]. 11/25/2012, 12:32 PM

## 2012-11-25 NOTE — Progress Notes (Signed)
Family Medicine Teaching Service Attending Note  I discussed patient Cynthia [name]  with Dr. Dianah Field and reviewed their note for today.  I agree with their assessment and plan.

## 2012-11-25 NOTE — Procedures (Signed)
I was present at this dialysis session. I have reviewed the session itself and made appropriate changes.   Kelly Splinter, MD [employer] 11/25/2012, 10:19 AM

## 2012-11-25 NOTE — ED Provider Notes (Addendum)
History     CSN: [id number]  Arrival date & time 11/24/12  2300   First MD Initiated Contact with Patient 11/25/12 0056      Chief Complaint  Patient presents with  . Emesis   HPI Cynthia [name] is a 42 y.o. female with end-stage renal disease who is dialysis dependent attending Tuesday, Thursday and Saturday hemodialysis presents with nausea and vomiting today. Patient was seen at [facility] urgent care and directed to the emergency department for hyperkalemia. Patient also has diabetes which was the source of the patient's renal failure. Patient's last dialysis was off schedule on Friday. She received dialysis at [facility]. Symptoms have been moderate to severe, constant, ongoing all day, patient has not taken anything for them. She was able to take her hypertensive medications later on during the day.   Past Medical History  Diagnosis Date  . Diabetes mellitus   . Hypertension   . Renal disorder     t, th, sat HD    Past Surgical History  Procedure Laterality Date  . Cholecystectomy    . Abdominal hysterectomy    . Breast surgery      biopsy bilateral    No family history on file.  History  Substance Use Topics  . Smoking status: Never Smoker   . Smokeless tobacco: Not on file  . Alcohol Use: No    OB History   Grav Para Term Preterm Abortions TAB SAB Ect Mult Living                  Review of Systems At least 10pt or greater review of systems completed and are negative except where specified in the HPI.  Allergies  Review of patient's allergies indicates no known allergies.  Home Medications   Current Outpatient Rx  Name  Route  Sig  Dispense  Refill  . calcium acetate (PHOSLO) 667 MG capsule   Oral   Take 1,334 mg by mouth 3 (three) times daily with meals.         . Cholecalciferol (VITAMIN D PO)   Oral   Take 1 tablet by mouth daily.         . insulin glargine (LANTUS) 100 UNIT/ML injection   Subcutaneous   Inject 18 Units into the skin at  bedtime.         . multivitamin (RENA-VIT) TABS tablet   Oral   Take 1 tablet by mouth daily.         Marland Kitchen NIFEdipine (ADALAT CC) 90 MG 24 hr tablet   Oral   Take 90 mg by mouth daily.         . sevelamer (RENVELA) 800 MG tablet   Oral   Take 1,600 mg by mouth 3 (three) times daily with meals.         . valsartan (DIOVAN) 80 MG tablet   Oral   Take 80 mg by mouth daily.           BP 170/73  Pulse 80  Temp(Src) 97.9 F (36.6 C) (Oral)  Resp 16  SpO2 100%  Physical Exam  Nursing notes reviewed.  Electronic medical record reviewed. VITAL SIGNS:   Filed Vitals:   11/25/12 1200 11/25/12 1230 11/25/12 1249 11/25/12 1419  BP: 160/84 146/81 161/67 175/82  Pulse: 69 71 71 77  Temp:   97.8 F (36.6 C) 98.9 F (37.2 C)  TempSrc:   Oral Oral  Resp: 11 13 16 17   Height:  Weight:   166 lb 7.2 oz (75.5 kg)   SpO2: 99% 100% 100% 100%   CONSTITUTIONAL: Awake, oriented, appears non-toxic HENT: Atraumatic, normocephalic, oral mucosa pink and moist, airway patent. Nares patent without drainage. External ears normal. EYES: Conjunctiva clear, EOMI, PERRLA NECK: Trachea midline, non-tender, supple CARDIOVASCULAR: Normal heart rate, Normal rhythm, holosystolic murmur at the left upper and right upper sternal border rubs, gallops PULMONARY/CHEST: Clear to auscultation, no rhonchi, wheezes, or rales. Symmetrical breath sounds. Non-tender. ABDOMINAL: Non-distended, soft, non-tender - no rebound or guarding.  BS normal. NEUROLOGIC: Non-focal, moving all four extremities, no gross sensory or motor deficits. EXTREMITIES: No clubbing, cyanosis, or edema SKIN: Warm, Dry, No erythema, No rash   ED Course  Procedures (including critical care time)  Date: 11/26/2012  Rate: 77  Rhythm: normal sinus rhythm  QRS Axis: left  Intervals: normal  ST/T Wave abnormalities: normal  Conduction Disutrbances: none  Narrative Interpretation: Prominent T-waves, no ST/T wave elevation  suggestive of acute ischemia or infarction.     Labs Reviewed  CBC WITH DIFFERENTIAL - Abnormal; Notable for the following:    RBC 3.22 (*)    Hemoglobin 10.7 (*)    HCT 30.3 (*)    All other components within normal limits  COMPREHENSIVE METABOLIC PANEL - Abnormal; Notable for the following:    Potassium 6.4 (*)    Chloride 91 (*)    Glucose, Bld 140 (*)    BUN 93 (*)    Creatinine, Ser 13.40 (*)    Calcium 11.2 (*)    Total Bilirubin 0.2 (*)    GFR calc non Af Amer 3 (*)    GFR calc Af Amer 3 (*)    All other components within normal limits  PRO B NATRIURETIC PEPTIDE - Abnormal; Notable for the following:    Pro B Natriuretic peptide (BNP) 6670.0 (*)    All other components within normal limits  RENAL FUNCTION PANEL - Abnormal; Notable for the following:    Potassium 5.4 (*)    Chloride 94 (*)    BUN 98 (*)    Creatinine, Ser 14.05 (*)    Calcium 11.4 (*)    Phosphorus 5.1 (*)    GFR calc non Af Amer 3 (*)    GFR calc Af Amer 3 (*)    All other components within normal limits  CBC - Abnormal; Notable for the following:    RBC 2.81 (*)    Hemoglobin 9.3 (*)    HCT 26.4 (*)    All other components within normal limits  GLUCOSE, CAPILLARY - Abnormal; Notable for the following:    Glucose-Capillary 101 (*)    All other components within normal limits  GLUCOSE, CAPILLARY - Abnormal; Notable for the following:    Glucose-Capillary 138 (*)    All other components within normal limits  TROPONIN I  HEMOGLOBIN A1C  GLUCOSE, CAPILLARY   Dg Chest Port 1 View  11/25/2012   *RADIOLOGY REPORT*  Clinical Data: Nausea and emesis.  PORTABLE CHEST - 1 VIEW  Comparison: No priors.  Findings: Lung volumes are normal.  No consolidative airspace disease.  No pleural effusions.  No pneumothorax.  No pulmonary nodule or mass noted.  Pulmonary vasculature and the cardiomediastinal silhouette are within normal limits.  IMPRESSION: 1. No radiographic evidence of acute cardiopulmonary disease.    Original Report Authenticated By: Vinnie Langton, M.D.     1. Nausea and vomiting   2. Hyperkalemia   3. ESRD on dialysis  4. Hypertension   5. IDDM (insulin dependent diabetes mellitus)       MDM  Patient presents with nausea and vomiting likely secondary to not having dialysis since Friday, she is also hyperkalemic, treat hyperkalemia with insulin, D50, Kayexalate.  No evidence for pulmonary edema. No evidence for acute ischemia or infarction.  Patient does not have a an acute abdomen, abdominal exam is benign, no elevation in transaminases her bilirubin. Discussed with internal medicine for admission and dialysis.        Rhunette Croft, MD 11/26/12 [id number]  Rhunette Croft, MD 11/27/12 [id number]

## 2012-11-25 NOTE — ED Notes (Addendum)
Report Attempted

## 2012-11-25 NOTE — Consult Note (Signed)
[facility] Renal Consultation Note  Indication for Consultation:  Management of ESRD/hemodialysis; anemia, hypertension/volume and secondary hyperparathyroidism  HPI: Cynthia [name] is a 42 y.o. female with ESRD on dialysis on TTS at [facility] who presented to Urgent Care on [address] yesterday with headaches, blurred vision, nausea, and vomiting and was found to have systolic blood pressure in the 220s and potassium of 6.4 with prominent T-waves on EKG.  Her last dialysis was on Friday 5/30, which was scheduled to accommodate an appointment at [facility] for transplant evaluation, but she was unable to get dialysis on her regular day on Saturday after the appointment was changed.  Yesterday her nausea and vomiting had prevented her from taking her antihypertensive medications.  She was sent to the hospital from Urgent Care yesterday and will receive dialysis today.  She is feeling much better after treatment with Zofran, Labetalol, Dextrose, Insulin, and Kayexalate.   Dialysis Orders: Center: [facility] on TTS. EDW 72.5 kg   HD Bath 2K/2.25Ca  Time 3 hrs  Heparin 4000 U. Access AVF @ LUA  BFR 400 DFR A1.5  Hectorol 6 mcg IV/HD  Epogen 2800 Units IV/HD  Venofer 0.  Past Medical History  Diagnosis Date  . Diabetes mellitus   . Hypertension   . Renal disorder     t, th, sat HD   Past Surgical History  Procedure Laterality Date  . Cholecystectomy    . Abdominal hysterectomy    . Breast surgery      biopsy bilateral   No family history on file.  Social History  She denies any history of tobacco, alcohol, or illicit drugs.  She lives with her husband and two children.  No Known Allergies Prior to Admission medications   Medication Sig Start Date End Date Taking? Authorizing Provider  calcium acetate (PHOSLO) 667 MG capsule Take 1,334 mg by mouth 3 (three) times daily with meals.   Yes Historical Provider, MD  Cholecalciferol (VITAMIN D PO) Take 1 tablet by mouth daily.   Yes  Historical Provider, MD  insulin glargine (LANTUS) 100 UNIT/ML injection Inject 18 Units into the skin at bedtime.   Yes Historical Provider, MD  multivitamin (RENA-VIT) TABS tablet Take 1 tablet by mouth daily.   Yes Historical Provider, MD  NIFEdipine (ADALAT CC) 90 MG 24 hr tablet Take 90 mg by mouth daily.   Yes Historical Provider, MD  sevelamer (RENVELA) 800 MG tablet Take 1,600 mg by mouth 3 (three) times daily with meals.   Yes Historical Provider, MD  valsartan (DIOVAN) 80 MG tablet Take 80 mg by mouth daily.   Yes Historical Provider, MD   Labs:  Results for orders placed during the hospital encounter of 11/25/12 (from the past 48 hour(s))  CBC WITH DIFFERENTIAL     Status: Abnormal   Collection Time    11/24/12 11:22 PM      Result Value Range   WBC 6.5  4.0 - 10.5 K/uL   RBC 3.22 (*) 3.87 - 5.11 MIL/uL   Hemoglobin 10.7 (*) 12.0 - 15.0 g/dL   HCT 30.3 (*) 36.0 - 46.0 %   MCV 94.1  78.0 - 100.0 fL   MCH 33.2  26.0 - 34.0 pg   MCHC 35.3  30.0 - 36.0 g/dL   RDW 13.3  11.5 - 15.5 %   Platelets 170  150 - 400 K/uL   Neutrophils Relative % 65  43 - 77 %   Neutro Abs 4.2  1.7 - 7.7  K/uL   Lymphocytes Relative 25  12 - 46 %   Lymphs Abs 1.6  0.7 - 4.0 K/uL   Monocytes Relative 5  3 - 12 %   Monocytes Absolute 0.3  0.1 - 1.0 K/uL   Eosinophils Relative 5  0 - 5 %   Eosinophils Absolute 0.3  0.0 - 0.7 K/uL   Basophils Relative 1  0 - 1 %   Basophils Absolute 0.0  0.0 - 0.1 K/uL  COMPREHENSIVE METABOLIC PANEL     Status: Abnormal   Collection Time    11/24/12 11:22 PM      Result Value Range   Sodium 136  135 - 145 mEq/L   Potassium 6.4 (*) 3.5 - 5.1 mEq/L   Comment: NO VISIBLE HEMOLYSIS     CRITICAL RESULT CALLED TO, READ BACK BY AND VERIFIED WITH:     SHULAR L,RN 11/25/12 0009 WAYK   Chloride 91 (*) 96 - 112 mEq/L   CO2 31  19 - 32 mEq/L   Glucose, Bld 140 (*) 70 - 99 mg/dL   BUN 93 (*) 6 - 23 mg/dL   Creatinine, Ser 13.40 (*) 0.50 - 1.10 mg/dL   Calcium 11.2 (*) 8.4  - 10.5 mg/dL   Total Protein 7.1  6.0 - 8.3 g/dL   Albumin 3.8  3.5 - 5.2 g/dL   AST 16  0 - 37 U/L   ALT 13  0 - 35 U/L   Alkaline Phosphatase 79  39 - 117 U/L   Total Bilirubin 0.2 (*) 0.3 - 1.2 mg/dL   GFR calc non Af Amer 3 (*) >90 mL/min   GFR calc Af Amer 3 (*) >90 mL/min   Comment:            The eGFR has been calculated     using the CKD EPI equation.     This calculation has not been     validated in all clinical     situations.     eGFR's persistently     <90 mL/min signify     possible Chronic Kidney Disease.  PRO B NATRIURETIC PEPTIDE     Status: Abnormal   Collection Time    11/25/12  1:40 AM      Result Value Range   Pro B Natriuretic peptide (BNP) 6670.0 (*) 0 - 125 pg/mL  TROPONIN I     Status: None   Collection Time    11/25/12  1:40 AM      Result Value Range   Troponin I <0.30  <0.30 ng/mL   Comment:            Due to the release kinetics of cTnI,     a negative result within the first hours     of the onset of symptoms does not rule out     myocardial infarction with certainty.     If myocardial infarction is still suspected,     repeat the test at appropriate intervals.  RENAL FUNCTION PANEL     Status: Abnormal   Collection Time    11/25/12  5:30 AM      Result Value Range   Sodium 139  135 - 145 mEq/L   Potassium 5.4 (*) 3.5 - 5.1 mEq/L   Chloride 94 (*) 96 - 112 mEq/L   CO2 28  19 - 32 mEq/L   Glucose, Bld 95  70 - 99 mg/dL   BUN 98 (*) 6 - 23 mg/dL  Creatinine, Ser 14.05 (*) 0.50 - 1.10 mg/dL   Calcium 11.4 (*) 8.4 - 10.5 mg/dL   Phosphorus 5.1 (*) 2.3 - 4.6 mg/dL   Albumin 3.7  3.5 - 5.2 g/dL   GFR calc non Af Amer 3 (*) >90 mL/min   GFR calc Af Amer 3 (*) >90 mL/min   Comment:            The eGFR has been calculated     using the CKD EPI equation.     This calculation has not been     validated in all clinical     situations.     eGFR's persistently     <90 mL/min signify     possible Chronic Kidney Disease.  GLUCOSE, CAPILLARY      Status: None   Collection Time    11/25/12  6:56 AM      Result Value Range   Glucose-Capillary 96  70 - 99 mg/dL   Constitutional: negative for chills, fatigue, fevers and weight loss Ears, nose, mouth, throat, and face: negative for earaches, hoarseness, nasal congestion and sore throat Respiratory: negative for cough, dyspnea on exertion, hemoptysis and sputum Cardiovascular: negative for chest pain, chest pressure/discomfort, dyspnea, orthopnea and palpitations Gastrointestinal: negative for abdominal pain, change in bowel habits, nausea and vomiting Genitourinary:negative, oliguric Musculoskeletal:negative for arthralgias, back pain, myalgias and neck pain Neurological: negative for coordination problems, dizziness, headaches and paresthesia  Physical Exam: Filed Vitals:   11/25/12 0657  BP: 166/71  Pulse: 78  Temp: 97.8 F (36.6 C)  Resp: 16     General appearance: alert, cooperative and no distress Head: Normocephalic, without obvious abnormality, atraumatic Neck: no adenopathy, no carotid bruit, no JVD and supple, symmetrical, trachea midline Resp: clear to auscultation bilaterally Cardio: RRR without murmur, no rub GI: soft, non-tender; bowel sounds normal; no masses,  no organomegaly Extremities: extremities normal, atraumatic, no cyanosis or edema Neurologic: Grossly normal Dialysis Access: AVF @ LUA with + bruit   Assessment/Plan: 1. Nausea & Vomiting - likely uremic secondary to missed dialysis, resolved. 2. Hypertensive urgency - secondary to missed meds and HD, BP initially 222/68, now 166/71 on Procardia 90 mg qd, Labetalol IV PRN. 3. Hyperkalemia - K initially 6.4, now 5.4 s/p Kayexalate, Dextrose, and Insulin. 4. ESRD -  HD on TTS @ [facility].  HD pending today. 5. Hypertension/volume  - HTN as above; wt 76.3 kg with EDW 72.5.  UF goal of 3-4 L. 6. Anemia  - Hgb 10.7 on outpatient Epogen. 7. Metabolic bone disease -  Ca 11.4 (11.6 corrected), P 5.1; Hectorol 6  mcg, Renvela and Phoslo with meals.  Hold Hectorol, use 2Ca bath. 8. Nutrition - Alb 3.3, renal diet, vitamin. 9. DM Type 2 - on insulin per primary.  LYLES,CHARLES 11/25/2012, 8:20 AM   Attending Nephrologist: Roney Jaffe, MD  Patient seen and examined.  Agree with assessment and plan as above.  Should be OK for discharge home after HD today.  Symptoms have mostly resolved and is getting dialysis now.  Kelly Splinter  MD [phone] pgr    [phone] cell 11/25/2012, 10:20 AM

## 2012-11-25 NOTE — H&P (Signed)
[facility] Admission History and Physical  Patient name: Cynthia [name] Medical record number: [id number] Date of birth: 01-16-1971 Age: 42 y.o. Gender: female  Primary Care Provider: Default, Provider, MD  Chief Complaint: nausea History of Present Illness: Cynthia [name] is a 42 y.o. year old female with HTN, DM, and ESRD on TTS dialysis presenting with nausea and vomiting x1 day.  History is obtained from the patient and her fiance.  They report that she was seen at [name] to establish care today where her blood pressure was quite elevated.  Per the note, they recommended that they go to the ED for additional treatment of the blood pressure.  Per the fiance, they went home, but when she continued to feel nauseated and have headaches, they came into the ED.  She reports intermittent headaches and blurry vision for the last week or so, none currently.  Also reports nausea and NBNB vomiting today and orthopnea without other dyspnea.  Denies fevers, chills, URI symptoms, chest pain, cough, abdominal pain, diarrhea, dysuria, leg swelling.  She did not take any of her medications yesterday due to the nausea.  She received dialysis on Thursday and Friday last week, but not on Saturday.  ED Course: Found to be hypertensive in the 220s/60s and hyperkalemic to 6.4 with prominent t-waves on EKG.  BUN/Cr also elevated.  Received zofran x1, labetalol x1, dextrose/insulin, and kayexalate.   Patient Active Problem List   Diagnosis Date Noted  . Hyperkalemia 11/25/2012  . Nausea 11/25/2012  . ESRD on dialysis 11/24/2012  . Accelerated hypertension 11/24/2012  . IDDM (insulin dependent diabetes mellitus) 11/24/2012   Past Medical History: Past Medical History  Diagnosis Date  . Diabetes mellitus   . Hypertension   . Renal disorder     t, th, sat HD    Past Surgical History: Past Surgical History  Procedure Laterality Date  . Cholecystectomy    .  Abdominal hysterectomy    . Breast surgery      biopsy bilateral    Social History: History   Social History  . Marital Status: Legally Separated    Spouse Name: N/A    Number of Children: N/A  . Years of Education: N/A   Social History Main Topics  . Smoking status: Never Smoker   . Smokeless tobacco: None  . Alcohol Use: No  . Drug Use: No  . Sexually Active:    Other Topics Concern  . None   Social History Narrative  . None    Family History: No family history on file.  Allergies: No Known Allergies  No current facility-administered medications for this encounter.   Current Outpatient Prescriptions  Medication Sig Dispense Refill  . calcium acetate (PHOSLO) 667 MG capsule Take 1,334 mg by mouth 3 (three) times daily with meals.      . Cholecalciferol (VITAMIN D PO) Take 1 tablet by mouth daily.      . insulin glargine (LANTUS) 100 UNIT/ML injection Inject 18 Units into the skin at bedtime.      . multivitamin (RENA-VIT) TABS tablet Take 1 tablet by mouth daily.      Marland Kitchen NIFEdipine (ADALAT CC) 90 MG 24 hr tablet Take 90 mg by mouth daily.      . sevelamer (RENVELA) 800 MG tablet Take 1,600 mg by mouth 3 (three) times daily with meals.      . valsartan (DIOVAN) 80 MG tablet Take 80 mg by mouth daily.  Review Of Systems: Per HPI with the following additions: none Otherwise 12 point review of systems was performed and was unremarkable.  Physical Exam: Pulse: 79  Blood Pressure: 146/68 RR: 11   O2: 95 on RA Temp: 97.9  General: sleeping but rouses to voice, cooperative, NAD HEENT: PERRLA, extra ocular movement intact, sclera clear, anicteric, oropharynx clear, no lesions and neck supple with midline trachea Heart: S1 and S2 normal, no S3 or S4, regular rate and rhythm, no edema or JVD, 2/6 pansystolic murmur heard at upper left sternal border, at upper right sternal border Lungs: clear to auscultation, no wheezes or rales and unlabored breathing Abdomen:  abdomen is soft without significant tenderness, masses, organomegaly or guarding Extremities: extremities normal, atraumatic, no cyanosis or edema Skin:no rashes, no ecchymoses Neurology: normal without focal findings, mental status, speech normal, alert and oriented x3, PERLA, muscle tone and strength normal and symmetric and sensation grossly normal  Labs and Imaging: Lab Results  Component Value Date/Time   NA 136 11/24/2012 11:22 PM   K 6.4* 11/24/2012 11:22 PM   CL 91* 11/24/2012 11:22 PM   CO2 31 11/24/2012 11:22 PM   BUN 93* 11/24/2012 11:22 PM   CREATININE 13.40* 11/24/2012 11:22 PM   GLUCOSE 140* 11/24/2012 11:22 PM   Lab Results  Component Value Date   WBC 6.5 11/24/2012   HGB 10.7* 11/24/2012   HCT 30.3* 11/24/2012   MCV 94.1 11/24/2012   PLT 170 11/24/2012   Trop: <0.30 Pro-BNP: 6670  CXR 1. No radiographic evidence of acute cardiopulmonary disease.  EKG: nsr, left axis deviation, prominent t-waves  Assessment and Plan: Cynthia [name] is a 42 y.o. year old female with IDDM, ESRD and HTN presenting with hypertensive urgency and a 1 day history of nausea/vomiting.  # Hypertensive urgency: Likely multifactorial from missing medication and missing Saturday dialysis.  No sign of end-organ damage although I was unable to visualize the optic nerve on exam.  She responded well to labetalol in the ED. - will admit to telemetry for monitoring - continue home nifedipine - will hold home valsartan for now as I'm not sure this will do much given her ESRD - labetalol 10mg  q2hr prn for BP > 180/110  # Nausea and vomiting: I suspect this is related to mild uremia from missed dialysis.  BUN elevated at 93.  No fever, abdominal pain, diarrhea or white count to suggest infection. - zofran prn - will call renal about dialysis today  # Hyperkalemia: 6.4 on admission.  Likely secondary to missed dialysis.  S/p dextrose/insulin and kayexalate in the ED.  EKG with mildly prominent T-waves. - recheck K and  EKG this am - call renal about dialysis this morning  # ESRD: Typically on TTS schedule but received dialysis Thursday and Friday last week.  Missed Saturday dialysis.  Followed by Dr. Mercy Moore.   - hyperkalemia, possible mild uremia with nausea/vomiting, mild volume overload with subjective orthopnea - no urgent indication for dialysis and meds given to improve K - will call renal in the morning for dialysis  # DM: on Lantus only at home. - will check A1c - Lantus 15 units qHS - sensitive SSI  # Anemia: Normocytic, likely from chronic renal disease.  Unknown baseline. Asymptomatic. - continue to monitor  FEN/GI: renal diet, SLIV Prophylaxis: SQ heparin Disposition: admit to telemetry; d/c pending dialysis and clinical improvement  BOOTH, Lorene Klimas 11/25/2012, 4:17 AM

## 2012-11-25 NOTE — H&P (Addendum)
FMTS Attending Admission Note: Cynthia Sabal MD Personal pager:  [phone] FPTS Service Pager:  [phone]  I  have seen and examined this patient, reviewed their chart. I have discussed this patient with the resident. I agree with the resident's findings, assessment and care plan.  Additionally:  Briefly, 41 year old female with hypertension and type 2 diabetes on Tuesday Thursday Saturday dialysis secondary according to her to her diabetes who presents with one-day history of nausea vomiting. She presented to urgent medical of family care to establish care and because of her nausea and vomiting. She was found to be urgently hypertensive to greater than A999333 systolic with a wide pulse pressure of less than 60 diastolic. She was instructed to the emergency department however she did not go. Overnight she continued to have worsening nausea vomiting and therefore presented to the emergency department late last night. Of note she did miss dialysis on Saturday. She had, what appears to be lack of communication, dialysis on Thursday and Friday last week. Otherwise she is not had dialysis since Friday.  Found to be hyperkalemic to 6.4, received Kayexalate in ED.  This morning she is feeling much better. She is not had any further nausea vomiting since coming to the hospital. She is eating breakfast when I examine her. No dysuria. No cough or fevers.  Exam: General: Patient appears stated age. Wonders. Eating breakfast Heart: [place] systolic grade 3/6 murmur heard loudest over the pulmonic area. Does have some diastolic component to her murmur.  Lungs: Crackles bilateral bases otherwise with good aeration Abdomen: Nondistended nontender. Soft. Extremities: No lower extremity edema  Impression and plan:  1. Nausea/vomiting: -Totally resolved this morning. She is sitting up in bed and eating breakfast. -Likely secondary to lack of dialysis last 4 days. -We are calling the renal team this morning to  evaluate her and take her back for dialysis. -She does tell me that she had an appointment on Thursday with [facility] transplant team however this was moved to in the near future and seems to have contributed to problems with her getting dialysis on Saturday, although she has trouble explaining to me why this happened.  2.  electrolyte abnormalities: -Hyperkalemic 6.4 the emergency department. She received Kayexalate. This is decreased to 5 point or today. -Hyper calcemic to greater than 11 in the emergency department. This is 11.4 the morning as well. -She is on calcium on outpatient basis. -Likely secondary to electrolytes services from dialysis. However would want to ensure that Nephrology has ruled out any sort of plasma cell dyscrasia causing lytic bone lesions.  This would be very uncommon in someone her age of course  3.  Hypertensive urgency: - Also resolved this morning. Can continue patient on her home nifedipine.     Alveda Reasons, MD 11/25/2012 8:24 AM

## 2012-11-27 NOTE — Discharge Summary (Signed)
Discharge Summary 11/27/2012 11:02 PM  Cynthia [name] DOB: [birth year] MRN: [id number]  Date of Admission: 11/25/2012 Date of Discharge: 11/25/2012  PCP: Default, Provider, MD Consultants: Renal  Reason for Admission: nausea/vomiting and HTN urgency   Discharge Diagnosis Primary 1. Hypertensive urgency Secondary 1. ESRD 2. DM 3. Anemia  Hospital Course:  Cynthia [name] is a 42 y.o. year old female with IDDM, ESRD and HTN presenting with hypertensive urgency and a 1 day history of nausea/vomiting, likely due to missing hemodialysis and uremia.   # Hypertensive urgency: Likely multifactorial from missing medication and missing Saturday dialysis. No sign of end-organ damage. She responded well to labetalol in the ED. Continued home nifedipine, held valsartan as unsure of benefit in ESRD patient, and dosed labetalol 10mg  q2 hours PRN for BP>180/110, which patient did not receive; discharged with home medications including valsartan. Reconsider benefit in ESRD patient.  # ESRD: Typically on TTS schedule but received dialysis Thursday and Friday last week. Missed Saturday. Followed by Dr. Mercy Moore. Likely resulted in hypertension, uremia causing nausea/emesis, and hyperkalemia. On admission, patient had hyperkalemia to 6.4. After dextrose/insulin and kayexilate in the ED, normalized. EKG had mildly prominent T-waves but pt was asymptomatic and vitals were stable. BUN elevated to 93 on admission, and patient had no fever, severe abdominal pain, diarrhea, or WBC to suggest infection. Dosed zofran PRN and nausea/emesis resolved just prior to dialysis and did not return. HD performed morning after admission and removed 3L and strongly encouraged not missing dialysis sessions. Also noted harsh holosystolic murmur only in LUSB and not heard at other auscultation points, which was determined to likely be sound transmission from left arm fistula. F/u clinically as outpatient. Discharged with continuation of  Tues-Thurs-Sat hemodialysis and recommending close PCP follow-up.   # DM: Patient is on lantus at home. Continued at lower dose this hospitalization, with addition of sensitive sliding scale insulin. Hemoglobin A1c 6/3 5.5. Discharged on home lantus 18 units at bedtime.  # Anemia: Normocytic, likely from chronic renal disease. Unknown baseline. Asymptomatic and stable.  # New murmur: Will relisten to patient today and look into any previous workup. Pt stable.     Discharge day physical exam: Filed Vitals:   11/25/12 1419  BP: 175/82  Pulse: 77  Temp: 98.9 F (37.2 C)  Resp: 17  General: NAD, pleasant, in HD  HEENT: [place], EOMI, sclera clear, MMM  CV: RRR, III/VI holosystolic murmur unchanged from admission, left arm with large grafted vein for HD  Pulm: CTAB with no wheezes or crackles, normal effort  Abd: soft, nontender, nondistended, NABS  Ext: No edema or cyanosis  Neuro: Awake, alert, no focal deficit, normal speech   Procedures: Hemodialysis  Discharge Medications   Medication List    TAKE these medications       calcium acetate 667 MG capsule  Commonly known as:  PHOSLO  Take 1,334 mg by mouth 3 (three) times daily with meals.     insulin glargine 100 UNIT/ML injection  Commonly known as:  LANTUS  Inject 18 Units into the skin at bedtime.     multivitamin Tabs tablet  Take 1 tablet by mouth daily.     NIFEdipine 90 MG 24 hr tablet  Commonly known as:  ADALAT CC  Take 90 mg by mouth daily.     ondansetron 4 MG tablet  Commonly known as:  ZOFRAN  Take 1 tablet (4 mg total) by mouth every 6 (six) hours as needed for nausea.  sevelamer carbonate 800 MG tablet  Commonly known as:  RENVELA  Take 1,600 mg by mouth 3 (three) times daily with meals.     valsartan 80 MG tablet  Commonly known as:  DIOVAN  Take 80 mg by mouth daily.     VITAMIN D PO  Take 1 tablet by mouth daily.        Pertinent Hospital Labs Recent Labs  Lab  11/24/12 2322   11/25/12 0908   WBC  6.5  5.7   HGB  10.7*  9.3*   HCT  30.3*  26.4*   PLT  170  157     Recent Labs  Lab  11/24/12 2322  11/25/12 0530   NA  136  139   K  6.4*  5.4*   CL  91*  94*   CO2  31  28   BUN  93*  98*   CREATININE  13.40*  14.05*   GLUCOSE  140*  95   CALCIUM  11.2*  11.4*    11/25/12 Chest xray: IMPRESSION:  1. No radiographic evidence of acute cardiopulmonary disease.   11/25/12 EKG: NSR with mild peaking of T waves   Discharge instructions: See AVS.  Condition at discharge: stable  Disposition: Home   Pending Tests: None  Follow up: Follow-up Information   Follow up with Your PCP at [facility]. Schedule an appointment as soon as possible for a visit in 3 days.      Follow up with Your dialysis center. (Tuesday, Thursday, and Saturday for hemodialysis)       Follow up Issues:  - Reconsider valsartan in ESRD patient. - F/u on harsh holosystolic murmur LUSB for any changes, though most likely from left arm dialysis fistula. - F/u K and BP at hemodialysis. - Consider stopping cholecalciferol in ESRD patient.   Conni Slipper MD  Family Practice Teaching Service Service Pager: [phone] Text pages welcome.  Can page through [email or web address], [place].  11/27/2012 11:02 PM

## 2012-12-12 ENCOUNTER — Other Ambulatory Visit: Payer: Self-pay

## 2012-12-12 DIAGNOSIS — Z1231 Encounter for screening mammogram for malignant neoplasm of breast: Secondary | ICD-10-CM

## 2012-12-31 ENCOUNTER — Ambulatory Visit: Payer: Medicare Other

## 2013-01-14 ENCOUNTER — Ambulatory Visit: Payer: Medicare Other

## 2013-01-16 ENCOUNTER — Ambulatory Visit: Payer: Medicare Other

## 2013-02-03 ENCOUNTER — Ambulatory Visit
Admission: RE | Admit: 2013-02-03 | Discharge: 2013-02-03 | Disposition: A | Discharge status: Home / Self Care | Payer: Medicare Other | Source: Ambulatory Visit

## 2013-02-03 DIAGNOSIS — Z1231 Encounter for screening mammogram for malignant neoplasm of breast: Secondary | ICD-10-CM

## 2013-02-10 ENCOUNTER — Emergency Department (HOSPITAL_COMMUNITY): Payer: Medicare Other

## 2013-02-10 ENCOUNTER — Observation Stay (HOSPITAL_COMMUNITY)
Admission: EM | Admit: 2013-02-10 | Discharge: 2013-02-11 | Disposition: A | Discharge status: Home / Self Care | Payer: Medicare Other | Attending: Family Medicine | Admitting: Family Medicine

## 2013-02-10 ENCOUNTER — Encounter (HOSPITAL_COMMUNITY): Payer: Self-pay | Admitting: Emergency Medicine

## 2013-02-10 DIAGNOSIS — D649 Anemia, unspecified: Secondary | ICD-10-CM | POA: Insufficient documentation

## 2013-02-10 DIAGNOSIS — IMO0001 Reserved for inherently not codable concepts without codable children: Secondary | ICD-10-CM

## 2013-02-10 DIAGNOSIS — N186 End stage renal disease: Secondary | ICD-10-CM | POA: Diagnosis present

## 2013-02-10 DIAGNOSIS — R55 Syncope and collapse: Principal | ICD-10-CM | POA: Insufficient documentation

## 2013-02-10 DIAGNOSIS — R197 Diarrhea, unspecified: Secondary | ICD-10-CM | POA: Diagnosis present

## 2013-02-10 DIAGNOSIS — Z79899 Other long term (current) drug therapy: Secondary | ICD-10-CM | POA: Insufficient documentation

## 2013-02-10 DIAGNOSIS — E1129 Type 2 diabetes mellitus with other diabetic kidney complication: Secondary | ICD-10-CM | POA: Insufficient documentation

## 2013-02-10 DIAGNOSIS — R072 Precordial pain: Secondary | ICD-10-CM | POA: Diagnosis present

## 2013-02-10 DIAGNOSIS — Z992 Dependence on renal dialysis: Secondary | ICD-10-CM | POA: Insufficient documentation

## 2013-02-10 DIAGNOSIS — R079 Chest pain, unspecified: Secondary | ICD-10-CM | POA: Insufficient documentation

## 2013-02-10 DIAGNOSIS — I951 Orthostatic hypotension: Secondary | ICD-10-CM | POA: Diagnosis present

## 2013-02-10 DIAGNOSIS — Z794 Long term (current) use of insulin: Secondary | ICD-10-CM | POA: Insufficient documentation

## 2013-02-10 DIAGNOSIS — I120 Hypertensive chronic kidney disease with stage 5 chronic kidney disease or end stage renal disease: Secondary | ICD-10-CM | POA: Insufficient documentation

## 2013-02-10 HISTORY — DX: Depression, unspecified: F32.A

## 2013-02-10 HISTORY — DX: Major depressive disorder, single episode, unspecified: F32.9

## 2013-02-10 LAB — CBC
HCT: 27.2 % — ABNORMAL LOW (ref 36.0–46.0)
MCHC: 36.4 g/dL — ABNORMAL HIGH (ref 30.0–36.0)
Platelets: 185 10*3/uL (ref 150–400)
RDW: 13.1 % (ref 11.5–15.5)
WBC: 6.2 10*3/uL (ref 4.0–10.5)

## 2013-02-10 LAB — CBC WITH DIFFERENTIAL/PLATELET
Basophils Absolute: 0 10*3/uL (ref 0.0–0.1)
Basophils Relative: 0 % (ref 0–1)
Eosinophils Absolute: 0.1 10*3/uL (ref 0.0–0.7)
HCT: 28.8 % — ABNORMAL LOW (ref 36.0–46.0)
MCH: 33.8 pg (ref 26.0–34.0)
MCHC: 36.1 g/dL — ABNORMAL HIGH (ref 30.0–36.0)
Monocytes Absolute: 0.5 10*3/uL (ref 0.1–1.0)
Monocytes Relative: 7 % (ref 3–12)
Neutro Abs: 5.7 10*3/uL (ref 1.7–7.7)
Neutrophils Relative %: 76 % (ref 43–77)
RDW: 13 % (ref 11.5–15.5)

## 2013-02-10 LAB — GLUCOSE, CAPILLARY: Glucose-Capillary: 145 mg/dL — ABNORMAL HIGH (ref 70–99)

## 2013-02-10 LAB — COMPREHENSIVE METABOLIC PANEL
AST: 17 U/L (ref 0–37)
Albumin: 3.6 g/dL (ref 3.5–5.2)
BUN: 26 mg/dL — ABNORMAL HIGH (ref 6–23)
Chloride: 95 mEq/L — ABNORMAL LOW (ref 96–112)
Creatinine, Ser: 6.7 mg/dL — ABNORMAL HIGH (ref 0.50–1.10)
Total Bilirubin: 0.3 mg/dL (ref 0.3–1.2)
Total Protein: 6.5 g/dL (ref 6.0–8.3)

## 2013-02-10 LAB — POCT I-STAT TROPONIN I: Troponin i, poc: 0.03 ng/mL (ref 0.00–0.08)

## 2013-02-10 LAB — CREATININE, SERUM: GFR calc non Af Amer: 6 mL/min — ABNORMAL LOW (ref >90)

## 2013-02-10 MED ORDER — ONDANSETRON HCL 4 MG PO TABS: 4.0000 mg | ORAL_TABLET | Freq: Four times a day (QID) | ORAL | Status: DC | PRN

## 2013-02-10 MED ORDER — NIFEDIPINE ER 60 MG PO TB24
60.0000 mg | ORAL_TABLET | Freq: Every day | ORAL | Status: DC
  Administered 2013-02-10: 60 mg via ORAL
  Filled 2013-02-10 (×2): qty 1

## 2013-02-10 MED ORDER — SODIUM CHLORIDE 0.9 % IV SOLN
INTRAVENOUS | Status: AC
  Administered 2013-02-10: 18:00:00 via INTRAVENOUS

## 2013-02-10 MED ORDER — ASPIRIN 81 MG PO CHEW
324.0000 mg | CHEWABLE_TABLET | Freq: Once | ORAL | Status: AC
  Administered 2013-02-10: 324 mg via ORAL
  Filled 2013-02-10: qty 4

## 2013-02-10 MED ORDER — SEVELAMER CARBONATE 800 MG PO TABS
1600.0000 mg | ORAL_TABLET | Freq: Three times a day (TID) | ORAL | Status: DC
  Administered 2013-02-11 (×2): 1600 mg via ORAL
  Filled 2013-02-10 (×5): qty 2

## 2013-02-10 MED ORDER — ALBUTEROL SULFATE (5 MG/ML) 0.5% IN NEBU: 2.5000 mg | INHALATION_SOLUTION | RESPIRATORY_TRACT | Status: DC | PRN

## 2013-02-10 MED ORDER — RENA-VITE PO TABS
1.0000 | ORAL_TABLET | Freq: Every day | ORAL | Status: DC
  Administered 2013-02-10: 1 via ORAL
  Filled 2013-02-10 (×2): qty 1

## 2013-02-10 MED ORDER — CALCIUM ACETATE 667 MG PO CAPS
1334.0000 mg | ORAL_CAPSULE | Freq: Three times a day (TID) | ORAL | Status: DC
  Administered 2013-02-11 (×2): 1334 mg via ORAL
  Filled 2013-02-10 (×5): qty 2

## 2013-02-10 MED ORDER — HEPARIN SODIUM (PORCINE) 5000 UNIT/ML IJ SOLN
5000.0000 [IU] | Freq: Three times a day (TID) | INTRAMUSCULAR | Status: DC
  Administered 2013-02-10 – 2013-02-11 (×2): 5000 [IU] via SUBCUTANEOUS
  Filled 2013-02-10 (×5): qty 1

## 2013-02-10 MED ORDER — SODIUM CHLORIDE 0.9 % IJ SOLN
3.0000 mL | Freq: Two times a day (BID) | INTRAMUSCULAR | Status: DC
  Administered 2013-02-11: 3 mL via INTRAVENOUS

## 2013-02-10 MED ORDER — SODIUM CHLORIDE 0.9 % IV BOLUS (SEPSIS)
250.0000 mL | Freq: Once | INTRAVENOUS | Status: AC
  Administered 2013-02-10: 250 mL via INTRAVENOUS

## 2013-02-10 MED ORDER — ACETAMINOPHEN 325 MG PO TABS
650.0000 mg | ORAL_TABLET | Freq: Four times a day (QID) | ORAL | Status: DC | PRN
  Administered 2013-02-11 (×2): 650 mg via ORAL
  Filled 2013-02-10 (×2): qty 2

## 2013-02-10 MED ORDER — ONDANSETRON HCL 4 MG/2ML IJ SOLN: 4.0000 mg | Freq: Four times a day (QID) | INTRAMUSCULAR | Status: DC | PRN

## 2013-02-10 MED ORDER — INSULIN GLARGINE 100 UNIT/ML ~~LOC~~ SOLN
18.0000 [IU] | Freq: Every day | SUBCUTANEOUS | Status: DC
  Administered 2013-02-10: 18 [IU] via SUBCUTANEOUS
  Filled 2013-02-10 (×2): qty 0.18

## 2013-02-10 MED ORDER — INSULIN ASPART 100 UNIT/ML ~~LOC~~ SOLN
0.0000 [IU] | Freq: Three times a day (TID) | SUBCUTANEOUS | Status: DC
  Administered 2013-02-10: 3 [IU] via SUBCUTANEOUS

## 2013-02-10 MED ORDER — ACETAMINOPHEN 650 MG RE SUPP: 650.0000 mg | Freq: Four times a day (QID) | RECTAL | Status: DC | PRN

## 2013-02-10 NOTE — ED Notes (Signed)
Patient not in room.  She is at radiology.  With do orthostatics and request urine when she returns.

## 2013-02-10 NOTE — ED Notes (Signed)
Pt from [place] transported via [facility], receiving dialysis treatment. 20 min remaining in treatment pt felt lethargic, chest pressure, SOB and experienced syncopal episode. Pt  CAO x3, c/o SOB and weak. 12 lead obtained in route and is unremarkable.

## 2013-02-10 NOTE — Progress Notes (Signed)
Notified MD re: elevated BP of 207/86. Patient is asymptomatic; other vital signs WDL.  Continuing to monitor.

## 2013-02-10 NOTE — ED Notes (Signed)
cory from dialysis at bedside to remove needles.

## 2013-02-10 NOTE — ED Provider Notes (Signed)
CSN: [id number]     Arrival date & time 02/10/13  1149 History     First MD Initiated Contact with Patient 02/10/13 1151     Chief Complaint  Patient presents with  . Near Syncope   (Consider location/radiation/quality/duration/timing/severity/associated sxs/prior Treatment) HPI Patient is a 42 year old female with multiple medical problems including hypertension diabetes and renal failure on hemodialysis, presenting to emergency department after experiencing an episode of weakness, near syncope, chest pain, shortness of breath while in dialysis. Patient was 20 minutes from the end of the dialysis when her symptoms began. Patient was seen by site physician who evaluated her. Her EKG at that time was unremarkable. Patient admitted feeling weak and dizzy at that time. Patient states she finished dialysis and was moving from her chair to a wheelchair she became dizzy and had a near-syncopal episode when standing up. Patient denies losing consciousness completely. Patient denies any prior similar symptoms. Patient states her symptoms are now all resolved including chest pain, weakness, shortness of breath. Patient states she does feel slightly dizzy. He shouldn't denies recent illnesses. Patient states she has been taking all her medications. Her blood sugar this morning was 140. Patient has not missed any dialysis recently. Past Medical History  Diagnosis Date  . Diabetes mellitus   . Hypertension   . Renal disorder     t, th, sat HD   Past Surgical History  Procedure Laterality Date  . Cholecystectomy    . Abdominal hysterectomy    . Breast surgery      biopsy bilateral  . Eye surgery    . Tubal ligation     Family History  Problem Relation Age of Onset  . Cancer Maternal Grandmother   . Diabetes Maternal Grandfather   . Diabetes Paternal Grandmother    History  Substance Use Topics  . Smoking status: Never Smoker   . Smokeless tobacco: Not on file  . Alcohol Use: No   OB  History   Grav Para Term Preterm Abortions TAB SAB Ect Mult Living                 Review of Systems  Constitutional: Positive for fatigue. Negative for fever and chills.  HENT: Negative for neck pain.   Respiratory: Positive for chest tightness and shortness of breath. Negative for cough and wheezing.   Cardiovascular: Positive for chest pain. Negative for palpitations and leg swelling.  Neurological: Positive for dizziness, syncope, weakness and light-headedness. Negative for numbness and headaches.  All other systems reviewed and are negative.    Allergies  Review of patient's allergies indicates no known allergies.  Home Medications   Current Outpatient Rx  Name  Route  Sig  Dispense  Refill  . calcium acetate (PHOSLO) 667 MG capsule   Oral   Take 1,334 mg by mouth 3 (three) times daily with meals.         . Cholecalciferol (VITAMIN D PO)   Oral   Take 1 tablet by mouth daily.         . insulin glargine (LANTUS) 100 UNIT/ML injection   Subcutaneous   Inject 18 Units into the skin at bedtime.         . multivitamin (RENA-VIT) TABS tablet   Oral   Take 1 tablet by mouth daily.         Marland Kitchen NIFEdipine (ADALAT CC) 90 MG 24 hr tablet   Oral   Take 90 mg by mouth daily.         Marland Kitchen  ondansetron (ZOFRAN) 4 MG tablet   Oral   Take 1 tablet (4 mg total) by mouth every 6 (six) hours as needed for nausea.   20 tablet   0   . sevelamer (RENVELA) 800 MG tablet   Oral   Take 1,600 mg by mouth 3 (three) times daily with meals.         . valsartan (DIOVAN) 80 MG tablet   Oral   Take 80 mg by mouth daily.          BP 160/73  Pulse 79  Temp(Src) 98.1 F (36.7 C) (Oral)  Resp 10  SpO2 100% Physical Exam  Nursing note and vitals reviewed. Constitutional: She is oriented to person, place, and time. She appears well-developed and well-nourished. No distress.  HENT:  Head: Normocephalic.  Eyes: Conjunctivae and EOM are normal. Pupils are equal, round, and  reactive to light.  Neck: Neck supple.  Cardiovascular: Normal rate, regular rhythm and normal heart sounds.   Pulmonary/Chest: Effort normal and breath sounds normal. No respiratory distress. She has no wheezes. She has no rales.  Abdominal: Soft. Bowel sounds are normal. She exhibits no distension. There is no tenderness. There is no rebound.  Musculoskeletal: She exhibits no edema.  Neurological: She is alert and oriented to person, place, and time. No cranial nerve deficit. Coordination normal.  5/5 and equal upper and lower extremity strength bilaterally. Equal grip strength bilaterally. Normal finger to nose and heel to shin. No pronator drift  Skin: Skin is warm and dry.  Psychiatric: She has a normal mood and affect. Her behavior is normal.    ED Course   Procedures (including critical care time)  Date: 02/10/2013  Rate: 79  Rhythm: normal sinus rhythm  QRS Axis: left  Intervals: normal  ST/T Wave abnormalities: normal  Conduction Disutrbances:none  Narrative Interpretation:   Old EKG Reviewed: none available   Labs Reviewed  CBC WITH DIFFERENTIAL - Abnormal; Notable for the following:    RBC 3.08 (*)    Hemoglobin 10.4 (*)    HCT 28.8 (*)    MCHC 36.1 (*)    All other components within normal limits  COMPREHENSIVE METABOLIC PANEL - Abnormal; Notable for the following:    Chloride 95 (*)    CO2 33 (*)    Glucose, Bld 136 (*)    BUN 26 (*)    Creatinine, Ser 6.70 (*)    GFR calc non Af Amer 7 (*)    GFR calc Af Amer 8 (*)    All other components within normal limits  URINALYSIS W MICROSCOPIC + REFLEX CULTURE  POCT I-STAT TROPONIN I   Dg Chest 2 View  02/10/2013   *RADIOLOGY REPORT*  Clinical Data: Near syncope  CHEST - 2 VIEW  Comparison: 11/25/2012  Findings: Hypoventilation.  Decreased lung volume with mild atelectasis in the bases.  Negative for pneumonia.  Negative for heart failure or effusion.  IMPRESSION: Hypoventilation.  No acute cardiopulmonary process.    Original Report Authenticated By: Carl Best, M.D.   1. Chest pain   2. Near syncope     MDM  Transported to the emergency department after a near syncopal episode and chest pain. Pain has now completely resolved. Patient was found to be hypertensive however she is orthostatic and when stood up her blood pressure dropped to 116/69 and patient became very dizzy. Heart rate remained stable. She was given 250 of normal saline. Her EKG is unchanged. Her workup here is unremarkable. Given  that her syncopal episode and chest pain will need observation admission. I spoke with [facility] hospitalist and they will admit patient. Results and plan discussed with patient her family and she agrees.  Filed Vitals:   02/10/13 1328 02/10/13 1329 02/10/13 1333 02/10/13 1443  BP: 160/61 165/60 116/69 171/67  Pulse: 81 75 83 77  Temp:      TempSrc:      Resp:    13  SpO2:    100%     Humza Tallerico A Alzora Ha, PA-C 02/10/13 1536

## 2013-02-10 NOTE — ED Notes (Signed)
Spoke with PA about UA. PA advised await NS Bolus for UA. No I&O at this time.

## 2013-02-10 NOTE — H&P (Signed)
[facility] Admission History and Physical Service Pager: [phone]  Patient name: Cynthia [name] Medical record number: [id number] Date of birth: 08/12/1970 Age: 42 y.o. Gender: female  Primary Care Provider: Default, Provider, MD Consultants: Nephrology Code Status: Full   Chief Complaint: near syncope   Assessment and Plan: Cynthia [name] is a 42 y.o. year old female presenting with near syncope after dialysis today. PMH is significant for ESRD on dialysis, type 2 diabetes since the 1990's on insulin, hypertension.   #Pre-syncope: differential includes orthostatic hypotension in setting of volume depletion from diarrhea/vomiting and dialysis vs cardiac etiology (arrhythmia)  Orthostatics showed a significant systolic drop from sitting to standing making orthostatic hypotension most likely. Will nonetheless monitor on tele for any arrhythmia and repeat EKG - tele monitoring, EKG, serial troponins - was given 250cc of fluids total at 50cc/hr for volume repletion. Now stopped in context of ESRD.  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 dialysis Tuesday/Th/Sat at [facility]. Nephrologist: Dr. Mercy Moore  - nephrology consulted - resume home renvela, renavit and phoslo  # hypertension: on nifedipine and valsartan at home.  - in setting of orthostatic hypotension, will add medicine one by one.  - start back nifedipine at reduced dose: 60mg  from 90mg  with titration upwards if worsening BP's  #chest pain: resolved.  - cont cardia monitoring [ ]  F/u Serial Troponin's [ ]  EKG   #DM: last A1C: 5.5 in June 2014 - Lantus 18 U and ISS  [ ]  CBG's   #Anemia: Hg baseline: 9.3-10.7. Likely anemia of chronic disease from ESRD.  - consider iron studies if not already done by nephrology outpatient.   FEN/GI: Carb modified   Prophylaxis: SubQ heparin  Disposition: admitted telemetry and family medicine teaching  service for near syncope   History of Present Illness: Cynthia [name] is a 42 y.o. year old female presenting here for near syncope. While in dialysis today, she started feeling numbness in her entire body, associated with difficulty breathing.  This lasted an estimated 20 minutes but patient does report loosing track of time at that point. No reported loss of consciousness. Her fiance came to see her soon after the event and found that she did not immediately recognize him. She was diaphoretic, trembling and not responding. She started behaving more normally shortly afterwards. During the episode, she was dizzy, felt she had a tight head and left chest pain. Chest pain lasted for a few minutes and resolved on its own. On arrival to the hospital, she felt that her whole body was numb but chest pain and shortness of breath had resolved. She has been on dialysis for 3 years and never had an experience like this before. Recent blood pressures have been on the lower end. She reports taking her medications, but did not take them this morning.  She gets dizzy when she stands up from sitting. These symptoms began three months ago. The dizziness goes away after she has been standing for a few seconds.   Diarrhea yesterday 5 times and no diarrhea today. Initially was soft then watery. She also had abdominal pain. Denies bloody stool. Doesn't recall any new foods. No recent ABX. Has been able to eat and drink today. Emesis yesterday 2-3 times. 3 or more weeks she has been complaining of headaches in the morning.  Headache is relieved by medications and sleeping. The headache is located on the sides of her head and feels like pressure.  She has had nausea and sensitivity to light but not sound. She complains of blurry vision and lost her glasses. Has no double vision. Vision has been getting worse lately.  No chest pain or headache today. Has been congested as of late.   Review Of Systems: Per HPI with the following  additions: none  Otherwise 12 point review of systems was performed and was unremarkable.  Patient Active Problem List   Diagnosis Date Noted  . Chest pain 02/10/2013  . Orthostatic hypotension 02/10/2013  . Diarrhea 02/10/2013  . Hyperkalemia 11/25/2012  . Nausea 11/25/2012  . ESRD on dialysis 11/24/2012  . Accelerated hypertension 11/24/2012  . IDDM (insulin dependent diabetes mellitus) 11/24/2012   Past Medical History: Past Medical History  Diagnosis Date  . Diabetes mellitus   . Hypertension   . Renal disorder     t, th, sat HD  . Depression    Past Surgical History: Past Surgical History  Procedure Laterality Date  . Cholecystectomy    . Abdominal hysterectomy    . Breast surgery      biopsy bilateral  . Eye surgery    . Tubal ligation    . Dialysis fistula creation Left    Social History: History  Substance Use Topics  . Smoking status: Never Smoker   . Smokeless tobacco: Never Used  . Alcohol Use: No   Additional social history:  Please also refer to relevant sections of EMR.  Family History: Family History  Problem Relation Age of Onset  . Cancer Maternal Grandmother   . Diabetes Maternal Grandfather   . Diabetes Paternal Grandmother    Allergies and Medications: No Known Allergies No current facility-administered medications on file prior to encounter.   Current Outpatient Prescriptions on File Prior to Encounter  Medication Sig Dispense Refill  . calcium acetate (PHOSLO) 667 MG capsule Take 1,334 mg by mouth 3 (three) times daily with meals.      . insulin glargine (LANTUS) 100 UNIT/ML injection Inject 18 Units into the skin at bedtime.      . multivitamin (RENA-VIT) TABS tablet Take 1 tablet by mouth daily.      Marland Kitchen NIFEdipine (ADALAT CC) 90 MG 24 hr tablet Take 90 mg by mouth daily.      . ondansetron (ZOFRAN) 4 MG tablet Take 1 tablet (4 mg total) by mouth every 6 (six) hours as needed for nausea.  20 tablet  0  . sevelamer (RENVELA) 800 MG  tablet Take 1,600 mg by mouth 3 (three) times daily with meals.      . valsartan (DIOVAN) 80 MG tablet Take 80 mg by mouth daily.        Objective: BP 165/58  Pulse 79  Temp(Src) 98.6 F (37 C) (Oral)  Resp 17  SpO2 97% Exam: General: NAD, cooperative on exam  HEENT: [place]/AT, EOMI, MMM, PERRL, oropharynx clear, no frontal sinus tenderness  Cardiovascular: A999333, RRR, 2/6 systolic murmur  Respiratory: CTAB, no wheezes/crackles Abdomen: soft, NTND, +BS, no organomegaly  Extremities: warm and well perfused, fistula in left bicep with palpable thrill Skin: no rashes  Neuro: Alert and oriented, CN 2-12 intact, 5/5 strength in upper and lower extremities bil, normal sensation in upper and lower extremities bil   Labs and Imaging: CBC BMET   Recent Labs Lab 02/10/13 1736  WBC 6.2  HGB 9.9*  HCT 27.2*  PLT 185    Recent Labs Lab 02/10/13 1345 02/10/13 1736  NA 137  --   K 4.4  --  CL 95*  --   CO2 33*  --   BUN 26*  --   CREATININE 6.70* 7.40*  GLUCOSE 136*  --   CALCIUM 9.7  --      CHEST - 2 VIEW  Comparison: 11/25/2012 IMPRESSION:  Hypoventilation. No acute cardiopulmonary process.   Clearance Coots, MD 02/10/2013, 8:26 PM PGY-1, [place] Intern pager: [phone], text pages welcome   Patient seen, examined. Available data reviewed. Agree with findings, assessment, and plan as outlined by Dr. Raeford Razor.  My additional findings are documented and highlighted above in blue.  [name] Graham, PGY-3 Family Medicine Resident

## 2013-02-10 NOTE — ED Notes (Signed)
Pt aware of the need for a urine sample however, is requesting water and stating " I only make a little bit of urine".

## 2013-02-10 NOTE — ED Provider Notes (Signed)
Medical screening examination/treatment/procedure(s) were performed by non-physician practitioner and as supervising physician I was immediately available for consultation/collaboration.    Kathalene Frames, MD 02/10/13 [phone]

## 2013-02-10 NOTE — ED Notes (Signed)
contated Dalene Seltzer charge RN dialysis unit- she will send staff to remove dialysis needles.

## 2013-02-11 DIAGNOSIS — N186 End stage renal disease: Secondary | ICD-10-CM

## 2013-02-11 DIAGNOSIS — R55 Syncope and collapse: Secondary | ICD-10-CM

## 2013-02-11 DIAGNOSIS — I951 Orthostatic hypotension: Secondary | ICD-10-CM

## 2013-02-11 LAB — GLUCOSE, CAPILLARY: Glucose-Capillary: 70 mg/dL (ref 70–99)

## 2013-02-11 LAB — TROPONIN I: Troponin I: <0.3 ng/mL (ref <0.30)

## 2013-02-11 NOTE — Consult Note (Signed)
Indication for Consultation:  Management of ESRD/hemodialysis; anemia, hypertension/volume and secondary hyperparathyroidism  HPI: Cynthia [name] is a 42 y.o. female who presented to the ED yesterday with chest pain and difficulty breathing during HD. She receives HD TTS @ [facility]. She states she felt fine before HD yesterday and for most of tx but during the last 30 mins she began to feel very weak, like her body was numb,  And had SOB and chest pain. She also reports feeling dizzy when she stood up.  She denies any episodes like this occuring in the past. She is diabetic, but says she did not check her glucose yesterday morning.  EKG was done at HD yesterday and showed no acute changes per MD note. She was given 267ml IVF, EKG and chest xray were negative. She states now all her symptoms are resolved, she feels much better but didn't sleep very well last night and has a headache which is improving with tylenol.   Past Medical History  Diagnosis Date  . Diabetes mellitus   . Hypertension   . Renal disorder     t, th, sat HD  . Depression    Past Surgical History  Procedure Laterality Date  . Cholecystectomy    . Abdominal hysterectomy    . Breast surgery      biopsy bilateral  . Eye surgery    . Tubal ligation    . Dialysis fistula creation Left    Family History  Problem Relation Age of Onset  . Cancer Maternal Grandmother   . Diabetes Maternal Grandfather   . Diabetes Paternal Grandmother    Social History:  reports that she has never smoked. She has never used smokeless tobacco. She reports that she does not drink alcohol or use illicit drugs. No Known Allergies Prior to Admission medications   Medication Sig Start Date End Date Taking? Authorizing Provider  calcium acetate (PHOSLO) 667 MG capsule Take 1,334 mg by mouth 3 (three) times daily with meals.   Yes Historical Provider, MD  insulin glargine (LANTUS) 100 UNIT/ML injection Inject 18 Units into the skin at bedtime.   Yes  Historical Provider, MD  multivitamin (RENA-VIT) TABS tablet Take 1 tablet by mouth daily.   Yes Historical Provider, MD  NIFEdipine (ADALAT CC) 90 MG 24 hr tablet Take 90 mg by mouth daily.   Yes Historical Provider, MD  ondansetron (ZOFRAN) 4 MG tablet Take 1 tablet (4 mg total) by mouth every 6 (six) hours as needed for nausea. 11/25/12  Yes Hilton Sinclair, MD  sevelamer (RENVELA) 800 MG tablet Take 1,600 mg by mouth 3 (three) times daily with meals.   Yes Historical Provider, MD  valsartan (DIOVAN) 80 MG tablet Take 80 mg by mouth daily.   Yes Historical Provider, MD   Current Facility-Administered Medications  Medication Dose Route Frequency Provider Last Rate Last Dose  . acetaminophen (TYLENOL) tablet 650 mg  650 mg Oral Q6H PRN Jonetta Osgood, MD   650 mg at 02/11/13 [id number]   Or  . acetaminophen (TYLENOL) suppository 650 mg  650 mg Rectal Q6H PRN Jonetta Osgood, MD      . albuterol (PROVENTIL) (5 MG/ML) 0.5% nebulizer solution 2.5 mg  2.5 mg Nebulization Q2H PRN Shanker Kristeen Mans, MD      . calcium acetate (PHOSLO) capsule 1,334 mg  1,334 mg Oral TID WC Jonetta Osgood, MD   1,334 mg at 02/11/13 0748  . heparin injection 5,000 Units  5,000 Units Subcutaneous  Q8H Shanker Kristeen Mans, MD   5,000 Units at 02/11/13 0645  . insulin aspart (novoLOG) injection 0-9 Units  0-9 Units Subcutaneous TID WC Jonetta Osgood, MD   3 Units at 02/10/13 1850  . insulin glargine (LANTUS) injection 18 Units  18 Units Subcutaneous QHS Jonetta Osgood, MD   18 Units at 02/10/13 2222  . multivitamin (RENA-VIT) tablet 1 tablet  1 tablet Oral QHS Jonetta Osgood, MD   1 tablet at 02/10/13 2222  . NIFEdipine (PROCARDIA-XL/ADALAT CC) 24 hr tablet 60 mg  60 mg Oral Daily Nobie Putnam, DO   60 mg at 02/10/13 2331  . ondansetron (ZOFRAN) tablet 4 mg  4 mg Oral Q6H PRN Shanker Kristeen Mans, MD       Or  . ondansetron [facility]) injection 4 mg  4 mg Intravenous Q6H PRN Shanker Kristeen Mans, MD      .  sevelamer carbonate (RENVELA) tablet 1,600 mg  1,600 mg Oral TID WC Jonetta Osgood, MD   1,600 mg at 02/11/13 0748  . sodium chloride 0.9 % injection 3 mL  3 mL Intravenous Q12H Jonetta Osgood, MD   3 mL at 02/11/13 0935   Labs: Basic Metabolic Panel:  Recent Labs Lab 02/10/13 1345 02/10/13 1736  NA 137  --   K 4.4  --   CL 95*  --   CO2 33*  --   GLUCOSE 136*  --   BUN 26*  --   CREATININE 6.70* 7.40*  CALCIUM 9.7  --    Liver Function Tests:  Recent Labs Lab 02/10/13 1345  AST 17  ALT 13  ALKPHOS 61  BILITOT 0.3  PROT 6.5  ALBUMIN 3.6   No results found for this basename: LIPASE, AMYLASE,  in the last 168 hours No results found for this basename: AMMONIA,  in the last 168 hours CBC:  Recent Labs Lab 02/10/13 1345 02/10/13 1736  WBC 7.5 6.2  NEUTROABS 5.7  --   HGB 10.4* 9.9*  HCT 28.8* 27.2*  MCV 93.5 94.1  PLT 193 185   Cardiac Enzymes:  Recent Labs Lab 02/10/13 1736 02/11/13 0020 02/11/13 0535  TROPONINI <0.30 <0.30 <0.30   CBG:  Recent Labs Lab 02/10/13 1831 02/10/13 2101 02/11/13 0733  GLUCAP 215* 145* 70   Iron Studies: No results found for this basename: IRON, TIBC, TRANSFERRIN, FERRITIN,  in the last 72 hours Studies/Results: Dg Chest 2 View  02/10/2013   *RADIOLOGY REPORT*  Clinical Data: Near syncope  CHEST - 2 VIEW  Comparison: 11/25/2012  Findings: Hypoventilation.  Decreased lung volume with mild atelectasis in the bases.  Negative for pneumonia.  Negative for heart failure or effusion.  IMPRESSION: Hypoventilation.  No acute cardiopulmonary process.   Original Report Authenticated By: Carl Best, M.D.    ROS:  Review of Systems: Gen: Denies any fever, chills. Reports fatigue and weakness HEENT: No visual complaints   CV: Reports chest pain, syncope, dizziness. Denies orthopnea, PND, and peripheral edema. Resp: Reports SOB. Denies cough, sputum, wheezing, coughing up blood GI: Denies n/v or abd pain, reports good  appetite. GU : Denies urinary burning,urinary frequency, urinary hesitancy,  MS: Denies joint pain, limitation of movement, Denies muscle weakness, cramps Heme: Denies bruising, bleeding, and enlarged lymph nodes. Neuro: Reports headache and weakness..  No diplopia.  No history of CVA.    Endocrine: DM- does not consistenly check glucose at home  Physical Exam: Filed Vitals:   02/11/13 0439 02/11/13 0941 02/11/13  [id number] 02/11/13 0943  BP: 145/78 156/68 149/75 136/72  Pulse: 87 81 84 86  Temp:      TempSrc:      Resp: 20     Weight: 73.483 kg (162 lb)     SpO2: 98%        General: Well developed, well nourished, in no acute distress. Resting comfortably in bed Head: Normocephalic, atraumatic, Neck: Supple. JVD not elevated. No carotid bruits Lungs: Clear bilaterally to auscultation without wheezes, rales, or rhonchi. Breathing is unlabored. Heart: RRR with murmur, no rubs, or gallops appreciated. Abdomen: Soft, non-tender, non-distended +BS x4. M-S:  Strength and tone appear normal for age. Lower extremities:without edema or ischemic changes, no open wounds  Neuro: Alert and oriented X 3. Moves all extremities spontaneously. Psych:  Responds to questions appropriately with a normal affect. Dialysis Access: LUA AVF +bruit and thrill  Dialysis Orders: TTS @ [facility]. 3 hr     72.5kg      2K/2.25 Ca+   4000u heparin    400/AF 1.5     LUA AVF    Hectorol  56mcg       Epogen 1600u IV/HD             Venofer 50 mg   Assessment/Plan:  Syncope-  250 ml bolus given. EKG  NSR. Cxray no acute process.  Symptoms resolved 1.  ESRD -  TTS @ [facility]   K+ 4.4  HD pending for tomorrow- can resume outpt if DC'd  2.  Hypertension/volume  - Orthostatic BPs: lying 156/68, sitting 149/75, standing 136/72., no edema. No volume excess. Nifedipine  3.  Anemia  - hgb 9.9 on Epo 1600 u, venofer 50 mg.  Tsat 47/ferritin 1067 (7/24)  4.  Metabolic bone disease -  Ca+ 9.7. Phos 7.4 (7/24) Phoslo and renvela with  meals 5.  Nutrition - alb 3.6. Carb modified diet. Multi vit. Reports good appetite 6. . DM- glucose 136. On home lantus  Shelle Iron, NP [facility] [place] Hark [phone] 02/11/2013, 10:31 AM   Patient seen and examined, agree with above note with above modifications.  Corliss Parish, MD 02/13/2013

## 2013-02-11 NOTE — Progress Notes (Signed)
I was informed that the patient was still being symptomatic during Orthostatics. I spoke with the nurse that reported from the tech that Ms. Anglada had to sit back down while orthostatics were being performed. I went to speak with Ms. Bradigan and she said the reason for sitting back down was due to her being tired. I had Ms. Grigoryan stand up and walk around the room and she didn't report any dizziness, lightheadedness or headache.   Clearance Coots, MD  PGY-1

## 2013-02-11 NOTE — Discharge Summary (Signed)
[facility] Discharge Summary  Patient name: Cynthia [name] Medical record number: [id number] Date of birth: 1970-08-03 Age: 42 y.o. Gender: female Date of Admission: 02/10/2013  Date of Discharge: 02/11/13 Admitting Physician: Andrena Mews, MD  Primary Care Provider: Default, Provider, MD Consultants: Nephrology   Indication for Hospitalization: near syncope   Discharge Diagnoses/Problem List:  Pre-syncope  ESRD HTN DM2 Anemia   Disposition: home   Discharge Condition: stable   Brief Hospital Course:  Cynthia [name] is a 42 y.o. year old female presenting with near syncope after dialysis today. PMH is significant for ESRD on dialysis, type 2 diabetes since the 1990's on insulin, hypertension.   #Pre-syncope: Most likely orthostatic hypotension in setting of volume depletion from diarrhea/vomiting and dialysis. Orthostatics showed a significant systolic drop from sitting to standing - was given 250cc of fluids total at 50cc/hr for volume repletion. Stopped after 4 hours in context of ESRD - Orthostatic BPs: lying 156/68, sitting 149/75, standing 136/72 at time of discharge, no edema. No volume excess.  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 dialysis Tuesday/Th/Sat at [name]. Nephrologist: Dr. Mercy Moore  - nephrology consulted  - resume home renvela, renavit and phoslo   # hypertension: on nifedipine and valsartan at home.  - in setting of orthostatic hypotension, will add medicine one by one.  - start back nifedipine at reduced dose: 60mg  from 90mg  with titration upwards if worsening BP's   #chest pain: resolved.  - 8/19 EKG: revealed prolong QTc  - 8/20 EKG: normal sinus rhythm with no prolonged QTc   #DM: last A1C: 5.5 in June 2014  - Lantus 18 U and ISS  - restarted on home medications at discharge   #Anemia: Hg baseline: 9.3-10.7. Likely anemia of chronic disease from ESRD.   - Epo 1600 u, venofer 50 mg. Tsat 47/ferritin 1067  ([id number])  #Metabolic bone disease:  Ca+ 9.7. Phos 7.4 (7/24) Phoslo and renvela with meals  Issues for Follow Up:  1. Pre-syncope: most likely related to the bout of diarrhea and vomiting the day before dialysis  2. HTN: nifedipine and valsartan. Possible need for decrease of doses if still becoming orthostatic   Significant Procedures: none   Significant Labs and Imaging:   Recent Labs Lab 02/10/13 1345 02/10/13 1736  WBC 7.5 6.2  HGB 10.4* 9.9*  HCT 28.8* 27.2*  PLT 193 185    Recent Labs Lab 02/10/13 1345 02/10/13 1736  NA 137  --   K 4.4  --   CL 95*  --   CO2 33*  --   GLUCOSE 136*  --   BUN 26*  --   CREATININE 6.70* 7.40*  CALCIUM 9.7  --   ALKPHOS 61  --   AST 17  --   ALT 13  --   ALBUMIN 3.6  --     Outstanding Results: none  Discharge Medications:    Medication List         calcium acetate 667 MG capsule  Commonly known as:  PHOSLO  Take 1,334 mg by mouth 3 (three) times daily with meals.     insulin glargine 100 UNIT/ML injection  Commonly known as:  LANTUS  Inject 18 Units into the skin at bedtime.     multivitamin Tabs tablet  Take 1 tablet by mouth daily.     NIFEdipine 90 MG 24 hr tablet  Commonly known as:  ADALAT CC  Take 90 mg by mouth daily.     ondansetron 4 MG  tablet  Commonly known as:  ZOFRAN  Take 1 tablet (4 mg total) by mouth every 6 (six) hours as needed for nausea.     sevelamer carbonate 800 MG tablet  Commonly known as:  RENVELA  Take 1,600 mg by mouth 3 (three) times daily with meals.     valsartan 80 MG tablet  Commonly known as:  DIOVAN  Take 80 mg by mouth daily.        Discharge Instructions: Please refer to Patient Instructions section of EMR for full details.  Patient was counseled important signs and symptoms that should prompt return to medical care, changes in medications, dietary instructions, activity restrictions, and follow up appointments.   Follow-Up Appointments: Follow-up Information    Schedule an appointment as soon as possible for a visit to follow up. (make an appointment in 1 week for follow up. )    Contact information:   [address]  [place], [place] [address] [phone]      Clearance Coots, MD 02/12/2013, 9:04 PM PGY-1, [place]

## 2013-02-11 NOTE — Progress Notes (Signed)
Orthostatic V/S obtained pt could not stand up the full three minutes after the standing vitals. She "needed to sit down".

## 2013-02-11 NOTE — H&P (Signed)
FMTS Attending Admission Note: Cynthia Monger,MD I  have seen and examined this patient, reviewed their chart. I have discussed this patient with the resident. I agree with the resident's findings, assessment and care plan.  Patient present to the ED for feeling of dizziness and passing out for few min,this occurred during dialysis,episode was associated with weakness,diaphoresis and SOB during dialysis, prior to that she had episodes of vomiting and diarrhea 1 day prior to getting dialysis. Sh denies head trauma or injury. Patient feel better now.  Filed Vitals:   02/11/13 0439 02/11/13 0941 02/11/13 0942 02/11/13 0943  BP: 145/78 156/68 149/75 136/72  Pulse: 87 81 84 86  Temp:      TempSrc:      Resp: 20     Weight: 162 lb (73.483 kg)     SpO2: 98%      Exam: Gen: Awake and alert,not in distress,comfortable in bed, oriented x 3. HEENT: EOMI,PERRLA, Resp: Air entry equal B/L,no wheezing,no crepitation. Heart: S1 S2 no murmurs. Abd: benign. Ext: No edema.  A/P; 1. Near Syncope: likely secondary to orthostatic hypotension vs Anemia.     Hold BP medication and monitor vital signs.     If stable may restart antihypertensive upon d/c home with close BP monitoring.     H/H low likely due to ESRD,to continue renal recommendation;i.e EPO.  2. ESRD on HD: F/U Nephrology recommendation.     Next Dialysis on Wednesday.  3. Chest pain: Currently asymptomatic.     TNI neg x 2     EKG neg for acute ischemic changes.     Telemetry monitoring.  4. DM: Continue home regimen with insulin sliding scale.  Exam:

## 2013-02-11 NOTE — Progress Notes (Signed)
Family Medicine Teaching Service Daily Progress Note Intern Pager: [phone]  Patient name: Cynthia [name] Medical record number: [id number] Date of birth: [birth year] Age: 42 y.o. Gender: female  Primary Care Provider: Default, Provider, MD Consultants: Nephrology  Code Status: Full   Pt Overview and Major Events to Date:  8/19 - orthostatic drop sitting 165/60 to standing 116/69 8/20 - ON BP 207/86  Assessment and Plan: Cynthia [name] is a 42 y.o. year old female presenting with near syncope after dialysis today. PMH is significant for ESRD on dialysis, type 2 diabetes since the 1990's on insulin, hypertension.   #Pre-syncope: differential includes orthostatic hypotension in setting of volume depletion from diarrhea/vomiting and dialysis vs cardiac etiology (arrhythmia) Orthostatics showed a significant systolic drop from sitting to standing making orthostatic hypotension most likely. Will nonetheless monitor on tele for any arrhythmia and repeat EKG  - tele monitoring, EKG, serial troponins  - was given 250cc of fluids total at 50cc/hr for volume repletion. Now stopped in context of ESRD.  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 dialysis Tuesday/Th/Sat at [facility]. Nephrologist: Dr. Mercy Moore  - nephrology consulted  - resume home renvela, renavit and phoslo   # hypertension: on nifedipine and valsartan at home.  - in setting of orthostatic hypotension, will add medicine one by one.  - start back nifedipine at reduced dose: 60mg  from 90mg  with titration upwards if worsening BP's   #chest pain: resolved.  - cont cardia monitoring  [ ]  F/u Serial Troponin's  [ ]  EKG   #DM: last A1C: 5.5 in June 2014  - Lantus 18 U and ISS  [ ]  CBG's   #Anemia: Hg baseline: 9.3-10.7. Likely anemia of chronic disease from ESRD.  - consider iron studies if not already done by nephrology outpatient.   FEN/GI: Carb modified  Prophylaxis: SubQ  heparin  Disposition: home pending improvement   Subjective: She had one episode of an elevated blood pressure and was restarted on a reduced dose of her nifidipine. She complains of a headache and had trouble sleeping last night.   Objective: Temp:  [98.1 F (36.7 C)-98.6 F (37 C)] 98.6 F (37 C) (08/20 0433) Pulse Rate:  [73-87] 87 (08/20 0439) Resp:  [10-20] 20 (08/20 0439) BP: (116-207)/(58-92) 145/78 mmHg (08/20 0439) SpO2:  [97 %-100 %] 98 % (08/20 0439) Weight:  [162 lb (73.483 kg)] 162 lb (73.483 kg) (08/20 0439) Physical Exam: General: NAD, cooperative on exam  Cardiovascular: [id number], RRR, 2/6 systolic murmur  Respiratory: CTAB, no wheezes/crackles  Abdomen: soft, NTND, +BS, no organomegaly  Extremities: warm and well perfused, fistula in left bicep with palpable thrill  Skin: no rashes  Neuro: Alert and oriented,   Laboratory:  Recent Labs Lab 02/10/13 1345 02/10/13 1736  WBC 7.5 6.2  HGB 10.4* 9.9*  HCT 28.8* 27.2*  PLT 193 185    Recent Labs Lab 02/10/13 1345 02/10/13 1736  NA 137  --   K 4.4  --   CL 95*  --   CO2 33*  --   BUN 26*  --   CREATININE 6.70* 7.40*  CALCIUM 9.7  --   PROT 6.5  --   BILITOT 0.3  --   ALKPHOS 61  --   ALT 13  --   AST 17  --   GLUCOSE 136*  --     Imaging/Diagnostic Tests: none  Clearance Coots, MD 02/11/2013, 7:23 AM PGY-1, [place]  [facility] FPTS Intern pager: [phone], text pages welcome

## 2013-02-11 NOTE — Progress Notes (Addendum)
FMTS Attending Admission Note: Cynthia Dalesandro,MD I  have seen and examined this patient, reviewed their chart. I have discussed this patient with the resident. I agree with the resident's findings, assessment and care plan.  Patient continues to improve,appears stable.I walked her around for few min and she felt okay with no dizziness. BP level is rising,since she is asymptomatic might restart her home Norvasc with close BP monitoring. Nephrology follow up for dialysis. Continue home medications for other chronic conditions.

## 2013-02-11 NOTE — Progress Notes (Signed)
Utilization review completed.  

## 2013-02-11 NOTE — Progress Notes (Signed)
Patient and MD connected to [employer].  Interpreter: Priscilla/ID: [id number]

## 2013-02-13 NOTE — Discharge Summary (Signed)
FMTS Attending Admission Note: Deforest Maiden,MD I  have seen and examined this patient, reviewed their chart. I have discussed this patient with the resident. I agree with the resident's findings, assessment and care plan.  

## 2013-03-11 ENCOUNTER — Ambulatory Visit (INDEPENDENT_AMBULATORY_CARE_PROVIDER_SITE_OTHER): Payer: Medicare Other | Admitting: Family Medicine

## 2013-03-11 VITALS — BP 170/80 | HR 78 | Temp 98.2°F | Resp 20 | Ht 60.75 in | Wt 162.0 lb

## 2013-03-11 DIAGNOSIS — I951 Orthostatic hypotension: Secondary | ICD-10-CM

## 2013-03-11 DIAGNOSIS — G4700 Insomnia, unspecified: Secondary | ICD-10-CM

## 2013-03-11 DIAGNOSIS — I10 Essential (primary) hypertension: Secondary | ICD-10-CM

## 2013-03-11 DIAGNOSIS — G589 Mononeuropathy, unspecified: Secondary | ICD-10-CM | POA: Insufficient documentation

## 2013-03-11 MED ORDER — TRAZODONE HCL 50 MG PO TABS: 25.0000 mg | ORAL_TABLET | Freq: Every evening | ORAL | Status: DC | PRN

## 2013-03-11 NOTE — Progress Notes (Signed)
Subjective:    Patient ID: Cynthia [name], female    DOB: Oct 28, 1970, 42 y.o.   MRN: [id number] Chief Complaint  Patient presents with  . Insomnia    pt having a hard time sleeping at night, states it has been going on for a month now  . Sore Throat    pain when swallowing, started yesterday    HPI  Is here with her daughter who translates for her.  For the past month, has been having a lot of trouble sleeping and has been worrying a lot at night, has gotten more nervous.  Not having thoughts or worries, just her body won't let her sleep.  Has been trying nyquil which helps sometimes. Sometimes is worried but that is not is what is keeping her up at night.  No depression, mood good.  Took 1 tab of benadryl but that didn't work.  Has never tried any prescription medications for this as this is the first time it has happened.  Tried sleepytime tea once without success.  Used to go to a clinic that closed - [facility] - so she does not currently have a PCP, just comes here now to [facility] and sees a different provider every time.  Needs a prescription for a blood pressure machine.  Past Medical History  Diagnosis Date  . Diabetes mellitus   . Hypertension   . Renal disorder     t, th, sat HD  . Depression    Current Outpatient Prescriptions on File Prior to Visit  Medication Sig Dispense Refill  . calcium acetate (PHOSLO) 667 MG capsule Take 1,334 mg by mouth 3 (three) times daily with meals.      . insulin glargine (LANTUS) 100 UNIT/ML injection Inject 18 Units into the skin at bedtime.      . multivitamin (RENA-VIT) TABS tablet Take 1 tablet by mouth daily.      Marland Kitchen NIFEdipine (ADALAT CC) 90 MG 24 hr tablet Take 90 mg by mouth daily.      . ondansetron (ZOFRAN) 4 MG tablet Take 1 tablet (4 mg total) by mouth every 6 (six) hours as needed for nausea.  20 tablet  0  . sevelamer (RENVELA) 800 MG tablet Take 1,600 mg by mouth 3 (three) times daily with meals.      . valsartan (DIOVAN) 80 MG  tablet Take 80 mg by mouth daily.       No current facility-administered medications on file prior to visit.   No Known Allergies  Review of Systems  Constitutional: Negative for fever, chills, diaphoresis, activity change, appetite change, fatigue and unexpected weight change.  Cardiovascular: Negative for chest pain.  Gastrointestinal: Negative for nausea and vomiting.  Psychiatric/Behavioral: Positive for sleep disturbance. Negative for behavioral problems, confusion, dysphoric mood, decreased concentration and agitation. The patient is not nervous/anxious and is not hyperactive.       BP 170/80  Pulse 78  Temp(Src) 98.2 F (36.8 C) (Oral)  Resp 20  Ht 5' 0.75" (1.543 m)  Wt 162 lb (73.483 kg)  BMI 30.86 kg/m2  SpO2 99% Objective:   Physical Exam  Constitutional: She is oriented to person, place, and time. She appears well-developed and well-nourished. No distress.  HENT:  Head: Normocephalic and atraumatic.  Right Ear: External ear normal.  Eyes: Conjunctivae are normal. No scleral icterus.  Pulmonary/Chest: Effort normal.  Neurological: She is alert and oriented to person, place, and time.  Skin: Skin is warm and dry. She is not diaphoretic. No erythema.  Psychiatric: She has a normal mood and affect. Her behavior is normal.      Assessment & Plan:  Orthostatic hypotension - see recent hosp on FPS. Cont valsartan and nifedipine.  Accelerated hypertension - rec obtaining BP cuff to check pressures at home as variable depending on dialysis timing. Gave hard rx to try to get through medical supply paid for by medicare.  Insomnia - trial of trazodone prn. Discussed sleep hygiene. Call if trazodone not helping - ok to increase to 2 tabs/night or would be ok to do short term trial of zolpidem if not working.  Meds ordered this encounter  Medications  . traZODone (DESYREL) 50 MG tablet    Sig: Take 0.5-1 tablets (25-50 mg total) by mouth at bedtime as needed for sleep.     Dispense:  30 tablet    Refill:  3

## 2013-03-11 NOTE — Patient Instructions (Signed)
We are not accepting new Medicare patients at this time for our primary care clinic though we are happy to continue caring for you in our urgent care clinic (where you have been coming at 102.)  Insomnio (Insomnia) El insomnio es un trastorno frecuente en la capacidad para dormirse o para Public affairs consultant dormido. Puede ser un problema crnico o un trastorno del momento. En ambos casos es un problema frecuente. Puede tratarse de un problema del momento cuando se relaciona con alguna situacin de estrs o preocupacin. Es un trastorno crnico cuando se relaciona con situaciones de Teacher, music las horas de vigilia o con malos hbitos de sueo. Con el tiempo, la privacin del sueo en s misma, puede hacer que el problema empeore. Las cosas ms pequeas se agravan debido al cansancio y a que la capacidad para enfrentarlas disminuye. CAUSAS  Estrs, ansiedad y depresin.  Malos hbitos para dormir.  Distracciones como mirar TV en la cama.  Siestas en horarios prximos a la hora de ir a dormir.  Involucrarse en conversaciones de gran carga emocional antes de ir a dormir.  Leer textos tcnicos antes de dormir.  Consumir alcohol y otros sedantes. Ellos pueden empeorar el problema. Pueden modificar los patrones de sueo normales y la normal [name].  Consumir estimulantes como cafena algunas horas antes de ir a dormir.  Sndromes dolorosos y las dificultades respiratorias pueden causar insomnio.  Realizar ejercicios a ltima hora de la noche.  El cambio en las zonas horarias puede causar trastornos del sueo (jet lag). En algunos casos se recomienda que otra persona observe sus patrones de sueo. Deben observar los perodos en los que no respira durante la noche (apnea del sueo). Tambin deben observar cunto tiempo duran esos perodos. Si vive slo o no tiene una persona de confianza que lo observe, podr concurrir a una clnica del sueo, en la que lo controlarn de [place] profesional.  La apnea del sueo requiere controles y Clinical research associate. Entregue su historia clnica al profesional que lo asiste Tambin infrmele las observaciones que sus familiares hayan hecho con respecto a sus hbitos de sueo.  SNTOMAS  Sentir por la maana que no ha descansado lo suficiente.  Ansiedad y agitacin a la hora de dormir  Dificultad para dormirse o para Engineer, agricultural sueo. TRATAMIENTO  El [employer] indicar un tratamiento para los trastornos subyacentes. Tambin podr aconsejarlo o ayudarlo si usted toma alcohol o se automedica con otras drogas. El tratamiento de los problemas subyacentes generalmente eliminarn los problemas de insomnio.  Podrn prescribirle medicamentos para usar durante un plazo breve. Generalmente no se recomiendan para un uso prolongado.  Generalmente no se recomiendan los medicamentos de venta libre para un uso prolongado. Podran causarle adiccin.  Puede hacer ms fcil el conciliar el sueo si realiza modificaciones en su estilo de vida tales como:  Utilizar tcnicas de relajacin que favorecen la respiracin y reducen la tensin muscular.  No practicar actividad fsica en [place].  Modificar la dieta y el horario de la ltima comida. No tomar colaciones durante la noche  Trate de establecer una hora habitual para irse a dormir.  La psicoterapia puede ser de utilidad para los problemas que le ocasionan estrs y preocupaciones.  Si hay un ambiente ruidoso y no Water engineer, puede ser til Conservation officer, nature suave o sonidos agradables.  Suspenda los trabajos tediosos y [employer] al menos una hora antes de ir a dormir. INSTRUCCIONES PARA EL CUIDADO DOMICILIARIO  Lleve un diario. Infrmele al profesional  que lo asiste sus progresos. Esto incluye todos los efectos secundarios de los medicamentos. Concurra regularmente a la Passenger transport manager con el profesional [place] nota de:  La hora en que se duerme.  Las horas en las que permanece despierto  durante la noche.  La calidad del sueo.  Cmo se siente al da siguiente. Esta informacin ayudar a que Buyer, retail.   Levntese de la cama si permanece despierto por ms de 15 minutos. Lea o realice alguna actividad tranquila. [place]. Espere hasta que sienta sueo y luego vuelva a la cama.  Mantenga un ritmo constante de vigilia y sueo. Evite las siestas.  Practique actividad fsica con regularidad.  Evite las distracciones en el momento de ir a dormir. [place] distracciones se Clinical cytogeneticist ver televisin o Optometrist alguna actividad intensa o [place], hacer las cuentas de los gastos domsticos.  Programe un ritual para irse a dormir. Mantenga una rutina familiar relacionada con el bao diario, el cepillado de los [place], irse a la cama todas las noches a la misma hora, Pharmacist, community [place]. La rutina aumenta el xito de conciliar el sueo ms rpido.  Use tcnicas de relajacin. Practique rutinas que favorezcan la respiracin y alivien la tensin muscular. Tambin puede ayudarlo la visualizacin de escenas pacficas. Tambin puede tratar de [employer] pensamientos problemticos o molestos si mantiene la mente ocupada con pensamientos repetitivos o aburridos, como el antiguo consejo de Engineer, manufacturing systems. Tambin puede ser ms creativo, e imaginar que planta hermosas flores en su jardn, una detrs de la otra.  [place], trabaje para Special educational needs teacher. Cuando no es posible, algunas de las sugerencias ya presentadas lo ayudarn a reducir la ansiedad que acompaa las situaciones de estrs. EST SEGURO QUE:   Comprende las instrucciones para el alta mdica.  Controlar su enfermedad.  Solicitar atencin mdica de inmediato segn las indicaciones. Document Released: 06/11/2005 Document Revised: 09/03/2011 [facility] Patient Information 2014 [place], [place].

## 2013-05-14 ENCOUNTER — Other Ambulatory Visit (HOSPITAL_COMMUNITY): Payer: Self-pay | Admitting: *Deleted

## 2013-05-14 ENCOUNTER — Ambulatory Visit (HOSPITAL_COMMUNITY)
Admission: RE | Admit: 2013-05-14 | Discharge: 2013-05-14 | Disposition: A | Discharge status: Home / Self Care | Payer: Medicare Other | Source: Ambulatory Visit | Attending: Nephrology | Admitting: Nephrology

## 2013-05-14 DIAGNOSIS — N186 End stage renal disease: Secondary | ICD-10-CM | POA: Insufficient documentation

## 2013-05-14 DIAGNOSIS — D649 Anemia, unspecified: Secondary | ICD-10-CM | POA: Insufficient documentation

## 2013-05-14 LAB — PREPARE RBC (CROSSMATCH)

## 2013-05-15 ENCOUNTER — Encounter (HOSPITAL_COMMUNITY)
Admission: RE | Admit: 2013-05-15 | Discharge: 2013-05-15 | Disposition: A | Discharge status: Home / Self Care | Payer: Medicare Other | Source: Ambulatory Visit | Attending: Nephrology | Admitting: Nephrology

## 2013-05-15 ENCOUNTER — Encounter (HOSPITAL_COMMUNITY): Payer: Self-pay | Admitting: Emergency Medicine

## 2013-05-15 ENCOUNTER — Emergency Department (HOSPITAL_COMMUNITY)
Admission: EM | Admit: 2013-05-15 | Discharge: 2013-05-15 | Disposition: A | Discharge status: Home / Self Care | Payer: Medicare Other | Attending: Emergency Medicine | Admitting: Emergency Medicine

## 2013-05-15 DIAGNOSIS — Z794 Long term (current) use of insulin: Secondary | ICD-10-CM | POA: Insufficient documentation

## 2013-05-15 DIAGNOSIS — R0602 Shortness of breath: Secondary | ICD-10-CM | POA: Insufficient documentation

## 2013-05-15 DIAGNOSIS — R5383 Other fatigue: Secondary | ICD-10-CM | POA: Insufficient documentation

## 2013-05-15 DIAGNOSIS — R1310 Dysphagia, unspecified: Secondary | ICD-10-CM | POA: Insufficient documentation

## 2013-05-15 DIAGNOSIS — F3289 Other specified depressive episodes: Secondary | ICD-10-CM | POA: Insufficient documentation

## 2013-05-15 DIAGNOSIS — Y849 Medical procedure, unspecified as the cause of abnormal reaction of the patient, or of later complication, without mention of misadventure at the time of the procedure: Secondary | ICD-10-CM | POA: Insufficient documentation

## 2013-05-15 DIAGNOSIS — R5381 Other malaise: Secondary | ICD-10-CM | POA: Insufficient documentation

## 2013-05-15 DIAGNOSIS — Z992 Dependence on renal dialysis: Secondary | ICD-10-CM | POA: Insufficient documentation

## 2013-05-15 DIAGNOSIS — R6889 Other general symptoms and signs: Secondary | ICD-10-CM | POA: Insufficient documentation

## 2013-05-15 DIAGNOSIS — T8092XA Unspecified transfusion reaction, initial encounter: Secondary | ICD-10-CM | POA: Insufficient documentation

## 2013-05-15 DIAGNOSIS — E119 Type 2 diabetes mellitus without complications: Secondary | ICD-10-CM | POA: Insufficient documentation

## 2013-05-15 DIAGNOSIS — Z862 Personal history of diseases of the blood and blood-forming organs and certain disorders involving the immune mechanism: Secondary | ICD-10-CM | POA: Insufficient documentation

## 2013-05-15 DIAGNOSIS — N186 End stage renal disease: Secondary | ICD-10-CM | POA: Insufficient documentation

## 2013-05-15 DIAGNOSIS — Z79899 Other long term (current) drug therapy: Secondary | ICD-10-CM | POA: Insufficient documentation

## 2013-05-15 DIAGNOSIS — T8089XA Other complications following infusion, transfusion and therapeutic injection, initial encounter: Secondary | ICD-10-CM

## 2013-05-15 DIAGNOSIS — I120 Hypertensive chronic kidney disease with stage 5 chronic kidney disease or end stage renal disease: Secondary | ICD-10-CM | POA: Insufficient documentation

## 2013-05-15 DIAGNOSIS — F329 Major depressive disorder, single episode, unspecified: Secondary | ICD-10-CM | POA: Insufficient documentation

## 2013-05-15 DIAGNOSIS — R6883 Chills (without fever): Secondary | ICD-10-CM | POA: Insufficient documentation

## 2013-05-15 LAB — TRANSFUSION REACTION: Post RXN DAT IgG: NEGATIVE

## 2013-05-15 LAB — URINALYSIS W MICROSCOPIC + REFLEX CULTURE
Ketones, ur: NEGATIVE mg/dL
Leukocytes, UA: NEGATIVE
Nitrite: NEGATIVE
Protein, ur: >300 mg/dL — AB

## 2013-05-15 MED ORDER — SODIUM CHLORIDE 0.9 % IV SOLN
INTRAVENOUS | Status: DC
  Administered 2013-05-15: 10:00:00 via INTRAVENOUS

## 2013-05-15 MED ORDER — DIPHENHYDRAMINE HCL 50 MG/ML IJ SOLN
25.0000 mg | Freq: Once | INTRAMUSCULAR | Status: AC
  Administered 2013-05-15: 25 mg via INTRAVENOUS
  Filled 2013-05-15: qty 1

## 2013-05-15 MED ORDER — FAMOTIDINE IN NACL 20-0.9 MG/50ML-% IV SOLN
20.0000 mg | Freq: Once | INTRAVENOUS | Status: AC
  Administered 2013-05-15: 20 mg via INTRAVENOUS
  Filled 2013-05-15: qty 50

## 2013-05-15 MED ORDER — SODIUM CHLORIDE 0.9 % IV BOLUS (SEPSIS)
500.0000 mL | Freq: Once | INTRAVENOUS | Status: AC
  Administered 2013-05-15: 500 mL via INTRAVENOUS

## 2013-05-15 NOTE — Progress Notes (Signed)
Patient c/o dizziness, chills and difficulty catching her breath at 0955. Transfusion stopped. NS in new line started. Called [facility]. Was referred to dialysis center. Spoke with dialysis center charge nurse Marciano Sequin, RN. Langley Gauss called back and gave orders per Dr. Joelyn Oms. Patient and daughter aware. Called blood bank. [name] labs per blood bank. Called ED charge nurse. To take patient to triage. Patient and daughter aware. Blood bag, tubing, and labs taken to blood bank. Form tubed to blood bank. Patient reports that symptoms subsided 10 min after blood was stopped.

## 2013-05-15 NOTE — ED Notes (Signed)
Pt was rcvng blood transfusion from short stay and had blood rxn.  PT became sob, cold and began feeling a lump in her throat.  Blood was stopped immediately.  Blood blank called.  S/s have resolved except for the feeling of the lump in her throat.

## 2013-05-15 NOTE — ED Provider Notes (Signed)
CSN: [id number]     Arrival date & time 05/15/13  1109 History   First MD Initiated Contact with Patient 05/15/13 1126     Chief Complaint  Patient presents with  . blood transfusion rxn    (Consider location/radiation/quality/duration/timing/severity/associated sxs/prior Treatment) HPI  42 year old female with history of end-stage renal disease currently on  home hemodialysis dialysis presents to the ER for evaluation of blood transfusion reaction. Patient has history of anemia. She required blood transfusion once in the past about 2 and half years ago. She has been experiencing increased fatigue and her hemoglobin has dropped around 7. She received blood transfusion at 9:30 this a.m. 30 minutes into the transfusion she begins to develop sensation of lump in the throat, having increased shortness of breath, and feeling cold chills. Blood transfusion was immediately stopped follows with normal saline administration. Patient then sent to ER for further evaluation. Patient continues to endorse some symptoms but states it has not gotten worse. She denies fever, headache, tongue swelling, lip swelling, not from diarrhea, or rash. No prior history of drug allergies. No other precipitating symptoms. History obtained through daughter who is at bedside and acts as a Optometrist.    Past Medical History  Diagnosis Date  . Diabetes mellitus   . Hypertension   . Renal disorder     t, th, sat HD  . Depression    Past Surgical History  Procedure Laterality Date  . Cholecystectomy    . Abdominal hysterectomy    . Breast surgery      biopsy bilateral  . Eye surgery    . Tubal ligation    . Dialysis fistula creation Left    Family History  Problem Relation Age of Onset  . Cancer Maternal Grandmother   . Diabetes Maternal Grandfather   . Diabetes Paternal Grandmother   . Diabetes Mother   . Kidney disease Mother   . Diabetes Father   . Heart disease Father   . Kidney disease Father   .  Diabetes Sister   . Asthma Sister   . Lupus Sister   . Diabetes Brother    History  Substance Use Topics  . Smoking status: Never Smoker   . Smokeless tobacco: Never Used  . Alcohol Use: No   OB History   Grav Para Term Preterm Abortions TAB SAB Ect Mult Living                 Review of Systems  Constitutional: Positive for chills. Negative for fever.  HENT: Positive for trouble swallowing.   Respiratory: Positive for shortness of breath. Negative for wheezing.   Cardiovascular: Negative for chest pain.  Gastrointestinal: Negative for abdominal pain.  Skin: Negative for rash.  All other systems reviewed and are negative.    Allergies  Review of patient's allergies indicates no known allergies.  Home Medications   Current Outpatient Rx  Name  Route  Sig  Dispense  Refill  . calcium acetate (PHOSLO) 667 MG capsule   Oral   Take 1,334 mg by mouth 3 (three) times daily with meals.         . insulin glargine (LANTUS) 100 UNIT/ML injection   Subcutaneous   Inject 18 Units into the skin at bedtime.         . multivitamin (RENA-VIT) TABS tablet   Oral   Take 1 tablet by mouth daily.         Marland Kitchen NIFEdipine (ADALAT CC) 90 MG 24 hr tablet  Oral   Take 90 mg by mouth daily.         . ondansetron (ZOFRAN) 4 MG tablet   Oral   Take 1 tablet (4 mg total) by mouth every 6 (six) hours as needed for nausea.   20 tablet   0   . sevelamer (RENVELA) 800 MG tablet   Oral   Take 1,600 mg by mouth 3 (three) times daily with meals.         . traZODone (DESYREL) 50 MG tablet   Oral   Take 0.5-1 tablets (25-50 mg total) by mouth at bedtime as needed for sleep.   30 tablet   3   . valsartan (DIOVAN) 80 MG tablet   Oral   Take 80 mg by mouth daily.          BP 152/64  Pulse 88  Temp(Src) 98.4 F (36.9 C) (Oral)  Resp 16  Ht 5\' 1"  (1.549 m)  Wt 159 lb 13.3 oz (72.5 kg)  BMI 30.22 kg/m2  SpO2 98% Physical Exam  Nursing note and vitals  reviewed. Constitutional: She appears well-developed and well-nourished. No distress.  HENT:  Head: Atraumatic.  Eyes: Conjunctivae are normal.  Neck: Normal range of motion. Neck supple.  Cardiovascular: Normal rate and regular rhythm.  Exam reveals no gallop and no friction rub.   No murmur heard. Pulmonary/Chest: Effort normal and breath sounds normal. No respiratory distress. She has no wheezes. She exhibits no tenderness.  Abdominal: Soft. There is no tenderness.  Musculoskeletal: She exhibits no edema.  Neurological: She is alert.  Skin: No rash noted.  Left arm AV fistula with no evidence of thrombosis. Palpable thrills and bruits. No rash no signs of infection.   Patient has no hives or other overlying skin changes.  Psychiatric: She has a normal mood and affect.    ED Course  Procedures (including critical care time)  3:05 PM Pt developed discomfort including chills, throat discomfort and SOB while receiving blood transfusion.  Her symptoms has resolved after being monitored in ER for more than 3 hrs.  Pt stable for discharge.  Instruction for close f/u with PCP for further care of her anemia.  Return precaution discussed.     Labs Review Labs Reviewed  URINE CULTURE   Imaging Review No results found.  EKG Interpretation   None       MDM   1. Allergic transfusion reaction, initial encounter    BP 143/60  Pulse 83  Temp(Src) 98.4 F (36.9 C) (Oral)  Resp 16  Ht 5\' 1"  (1.549 m)  Wt 159 lb 13.3 oz (72.5 kg)  BMI 30.22 kg/m2  SpO2 99%     Domenic Moras, PA-C 05/15/13 1507

## 2013-05-15 NOTE — ED Notes (Signed)
PT comfortable with d/c and f/u instructions. No prescriptions. 

## 2013-05-16 LAB — URINE CULTURE
Colony Count: NO GROWTH
Culture: NO GROWTH

## 2013-05-18 LAB — TYPE AND SCREEN
Antibody Screen: POSITIVE
DAT, IgG: NEGATIVE

## 2013-05-18 NOTE — ED Provider Notes (Signed)
Medical screening examination/treatment/procedure(s) were performed by non-physician practitioner and as supervising physician I was immediately available for consultation/collaboration.  EKG Interpretation   None         Alfonzo Feller, DO 05/18/13 0840

## 2013-05-28 DIAGNOSIS — Z940 Kidney transplant status: Secondary | ICD-10-CM | POA: Insufficient documentation

## 2013-06-24 ENCOUNTER — Other Ambulatory Visit: Payer: Self-pay | Admitting: Otolaryngology

## 2013-06-24 DIAGNOSIS — H9191 Unspecified hearing loss, right ear: Secondary | ICD-10-CM

## 2013-07-02 ENCOUNTER — Ambulatory Visit
Admission: RE | Admit: 2013-07-02 | Discharge: 2013-07-02 | Disposition: A | Discharge status: Home / Self Care | Payer: Medicare Other | Source: Ambulatory Visit | Attending: Otolaryngology | Admitting: Otolaryngology

## 2013-07-02 DIAGNOSIS — H9191 Unspecified hearing loss, right ear: Secondary | ICD-10-CM

## 2013-07-28 ENCOUNTER — Emergency Department (HOSPITAL_COMMUNITY): Payer: Medicare Other

## 2013-07-28 ENCOUNTER — Observation Stay (HOSPITAL_COMMUNITY)
Admission: EM | Admit: 2013-07-28 | Discharge: 2013-07-29 | Disposition: A | Discharge status: Home / Self Care | Payer: Medicare Other | Attending: Internal Medicine | Admitting: Internal Medicine

## 2013-07-28 ENCOUNTER — Encounter (HOSPITAL_COMMUNITY): Payer: Self-pay | Admitting: Emergency Medicine

## 2013-07-28 DIAGNOSIS — R0789 Other chest pain: Secondary | ICD-10-CM | POA: Diagnosis not present

## 2013-07-28 DIAGNOSIS — E162 Hypoglycemia, unspecified: Secondary | ICD-10-CM

## 2013-07-28 DIAGNOSIS — E1169 Type 2 diabetes mellitus with other specified complication: Secondary | ICD-10-CM | POA: Insufficient documentation

## 2013-07-28 DIAGNOSIS — E1129 Type 2 diabetes mellitus with other diabetic kidney complication: Secondary | ICD-10-CM | POA: Insufficient documentation

## 2013-07-28 DIAGNOSIS — N186 End stage renal disease: Secondary | ICD-10-CM

## 2013-07-28 DIAGNOSIS — I120 Hypertensive chronic kidney disease with stage 5 chronic kidney disease or end stage renal disease: Secondary | ICD-10-CM | POA: Insufficient documentation

## 2013-07-28 DIAGNOSIS — R202 Paresthesia of skin: Secondary | ICD-10-CM | POA: Diagnosis present

## 2013-07-28 DIAGNOSIS — N058 Unspecified nephritic syndrome with other morphologic changes: Secondary | ICD-10-CM

## 2013-07-28 DIAGNOSIS — K5900 Constipation, unspecified: Secondary | ICD-10-CM | POA: Diagnosis not present

## 2013-07-28 DIAGNOSIS — Z9889 Other specified postprocedural states: Secondary | ICD-10-CM | POA: Insufficient documentation

## 2013-07-28 DIAGNOSIS — R072 Precordial pain: Secondary | ICD-10-CM | POA: Diagnosis present

## 2013-07-28 DIAGNOSIS — R0602 Shortness of breath: Secondary | ICD-10-CM

## 2013-07-28 DIAGNOSIS — Z992 Dependence on renal dialysis: Secondary | ICD-10-CM | POA: Insufficient documentation

## 2013-07-28 DIAGNOSIS — IMO0001 Reserved for inherently not codable concepts without codable children: Secondary | ICD-10-CM

## 2013-07-28 DIAGNOSIS — Z79899 Other long term (current) drug therapy: Secondary | ICD-10-CM | POA: Insufficient documentation

## 2013-07-28 DIAGNOSIS — I10 Essential (primary) hypertension: Secondary | ICD-10-CM

## 2013-07-28 DIAGNOSIS — R079 Chest pain, unspecified: Secondary | ICD-10-CM

## 2013-07-28 DIAGNOSIS — E119 Type 2 diabetes mellitus without complications: Secondary | ICD-10-CM

## 2013-07-28 DIAGNOSIS — F3289 Other specified depressive episodes: Secondary | ICD-10-CM | POA: Insufficient documentation

## 2013-07-28 DIAGNOSIS — E109 Type 1 diabetes mellitus without complications: Secondary | ICD-10-CM

## 2013-07-28 DIAGNOSIS — F329 Major depressive disorder, single episode, unspecified: Secondary | ICD-10-CM | POA: Insufficient documentation

## 2013-07-28 DIAGNOSIS — K5909 Other constipation: Secondary | ICD-10-CM

## 2013-07-28 DIAGNOSIS — Z794 Long term (current) use of insulin: Secondary | ICD-10-CM

## 2013-07-28 DIAGNOSIS — R739 Hyperglycemia, unspecified: Secondary | ICD-10-CM | POA: Diagnosis present

## 2013-07-28 HISTORY — DX: Dependence on renal dialysis: Z99.2

## 2013-07-28 HISTORY — DX: Myoneural disorder, unspecified: G70.9

## 2013-07-28 HISTORY — DX: Dependence on renal dialysis: N18.6

## 2013-07-28 HISTORY — DX: Long term (current) use of insulin: Z79.4

## 2013-07-28 HISTORY — DX: Reserved for inherently not codable concepts without codable children: IMO0001

## 2013-07-28 HISTORY — DX: Type 2 diabetes mellitus with other diabetic kidney complication: E11.29

## 2013-07-28 HISTORY — DX: Gastro-esophageal reflux disease without esophagitis: K21.9

## 2013-07-28 LAB — CBC
HEMATOCRIT: 30.7 % — AB (ref 36.0–46.0)
Hemoglobin: 10.6 g/dL — ABNORMAL LOW (ref 12.0–15.0)
MCH: 31.8 pg (ref 26.0–34.0)
MCHC: 34.5 g/dL (ref 30.0–36.0)
MCV: 92.2 fL (ref 78.0–100.0)
PLATELETS: 128 10*3/uL — AB (ref 150–400)
RBC: 3.33 MIL/uL — ABNORMAL LOW (ref 3.87–5.11)
RDW: 14.7 % (ref 11.5–15.5)
WBC: 7 10*3/uL (ref 4.0–10.5)

## 2013-07-28 LAB — CBC WITH DIFFERENTIAL/PLATELET
BASOS ABS: 0 10*3/uL (ref 0.0–0.1)
Basophils Relative: 1 % (ref 0–1)
Eosinophils Absolute: 0.1 10*3/uL (ref 0.0–0.7)
Eosinophils Relative: 2 % (ref 0–5)
HCT: 35 % — ABNORMAL LOW (ref 36.0–46.0)
Hemoglobin: 12.5 g/dL (ref 12.0–15.0)
LYMPHS PCT: 34 % (ref 12–46)
Lymphs Abs: 2.5 10*3/uL (ref 0.7–4.0)
MCH: 32.3 pg (ref 26.0–34.0)
MCHC: 35.7 g/dL (ref 30.0–36.0)
MCV: 90.4 fL (ref 78.0–100.0)
Monocytes Absolute: 0.3 10*3/uL (ref 0.1–1.0)
Monocytes Relative: 5 % (ref 3–12)
Neutro Abs: 4.4 10*3/uL (ref 1.7–7.7)
Neutrophils Relative %: 59 % (ref 43–77)
Platelets: 149 10*3/uL — ABNORMAL LOW (ref 150–400)
RBC: 3.87 MIL/uL (ref 3.87–5.11)
RDW: 14.7 % (ref 11.5–15.5)
WBC: 7.4 10*3/uL (ref 4.0–10.5)

## 2013-07-28 LAB — BASIC METABOLIC PANEL
BUN: 50 mg/dL — ABNORMAL HIGH (ref 6–23)
CO2: 20 mEq/L (ref 19–32)
CREATININE: 6.23 mg/dL — AB (ref 0.50–1.10)
Calcium: 10.4 mg/dL (ref 8.4–10.5)
Chloride: 90 mEq/L — ABNORMAL LOW (ref 96–112)
GFR calc Af Amer: 9 mL/min — ABNORMAL LOW (ref >90)
GFR calc non Af Amer: 7 mL/min — ABNORMAL LOW (ref >90)
GLUCOSE: 139 mg/dL — AB (ref 70–99)
Potassium: 3.7 mEq/L (ref 3.7–5.3)
Sodium: 135 mEq/L — ABNORMAL LOW (ref 137–147)

## 2013-07-28 LAB — CREATININE, SERUM
Creatinine, Ser: 7.24 mg/dL — ABNORMAL HIGH (ref 0.50–1.10)
GFR calc Af Amer: 7 mL/min — ABNORMAL LOW (ref >90)
GFR calc non Af Amer: 6 mL/min — ABNORMAL LOW (ref >90)

## 2013-07-28 LAB — TROPONIN I
Troponin I: <0.3 ng/mL (ref <0.30)
Troponin I: <0.3 ng/mL (ref <0.30)

## 2013-07-28 LAB — GLUCOSE, CAPILLARY: GLUCOSE-CAPILLARY: 92 mg/dL (ref 70–99)

## 2013-07-28 MED ORDER — HEPARIN SODIUM (PORCINE) 5000 UNIT/ML IJ SOLN
5000.0000 [IU] | Freq: Three times a day (TID) | INTRAMUSCULAR | Status: DC
  Administered 2013-07-28 – 2013-07-29 (×2): 5000 [IU] via SUBCUTANEOUS
  Filled 2013-07-28 (×6): qty 1

## 2013-07-28 MED ORDER — ONDANSETRON HCL 4 MG/2ML IJ SOLN
4.0000 mg | Freq: Four times a day (QID) | INTRAMUSCULAR | Status: DC | PRN
Start: 2013-07-28 — End: 2013-07-29

## 2013-07-28 MED ORDER — ACETAMINOPHEN 325 MG PO TABS
650.0000 mg | ORAL_TABLET | Freq: Four times a day (QID) | ORAL | Status: DC | PRN
  Filled 2013-07-28: qty 2

## 2013-07-28 MED ORDER — SODIUM CHLORIDE 0.9 % IV BOLUS (SEPSIS)
500.0000 mL | Freq: Once | INTRAVENOUS | Status: AC
  Administered 2013-07-28: 500 mL via INTRAVENOUS

## 2013-07-28 MED ORDER — SODIUM CHLORIDE 0.9 % IV SOLN
INTRAVENOUS | Status: DC
  Administered 2013-07-28: 18:00:00 via INTRAVENOUS

## 2013-07-28 MED ORDER — INSULIN ASPART 100 UNIT/ML ~~LOC~~ SOLN
5.0000 [IU] | Freq: Three times a day (TID) | SUBCUTANEOUS | Status: DC
  Administered 2013-07-28: 5 [IU] via SUBCUTANEOUS

## 2013-07-28 MED ORDER — IRBESARTAN 75 MG PO TABS
75.0000 mg | ORAL_TABLET | Freq: Every day | ORAL | Status: DC
  Administered 2013-07-28 – 2013-07-29 (×2): 75 mg via ORAL
  Filled 2013-07-28 (×2): qty 1

## 2013-07-28 MED ORDER — NITROGLYCERIN 0.4 MG SL SUBL
0.4000 mg | SUBLINGUAL_TABLET | SUBLINGUAL | Status: DC | PRN
  Administered 2013-07-28 (×2): 0.4 mg via SUBLINGUAL

## 2013-07-28 MED ORDER — SODIUM CHLORIDE 0.9 % IJ SOLN
3.0000 mL | Freq: Two times a day (BID) | INTRAMUSCULAR | Status: DC
  Administered 2013-07-29: 3 mL via INTRAVENOUS

## 2013-07-28 MED ORDER — BISACODYL 5 MG PO TBEC
5.0000 mg | DELAYED_RELEASE_TABLET | Freq: Every day | ORAL | Status: DC | PRN
  Filled 2013-07-28: qty 1

## 2013-07-28 MED ORDER — ASPIRIN 81 MG PO CHEW
324.0000 mg | CHEWABLE_TABLET | Freq: Once | ORAL | Status: AC
  Administered 2013-07-28: 324 mg via ORAL
  Filled 2013-07-28: qty 4

## 2013-07-28 MED ORDER — SEVELAMER CARBONATE 800 MG PO TABS
2400.0000 mg | ORAL_TABLET | Freq: Three times a day (TID) | ORAL | Status: DC
  Administered 2013-07-28 – 2013-07-29 (×2): 2400 mg via ORAL
  Filled 2013-07-28 (×5): qty 3

## 2013-07-28 MED ORDER — ACETAMINOPHEN 650 MG RE SUPP: 650.0000 mg | Freq: Four times a day (QID) | RECTAL | Status: DC | PRN

## 2013-07-28 MED ORDER — MORPHINE SULFATE 2 MG/ML IJ SOLN: 1.0000 mg | INTRAMUSCULAR | Status: DC | PRN

## 2013-07-28 MED ORDER — POLYETHYLENE GLYCOL 3350 17 G PO PACK
17.0000 g | PACK | Freq: Every day | ORAL | Status: DC
  Administered 2013-07-29: 17 g via ORAL
  Filled 2013-07-28: qty 1

## 2013-07-28 MED ORDER — NIFEDIPINE ER OSMOTIC RELEASE 90 MG PO TB24
90.0000 mg | ORAL_TABLET | Freq: Every day | ORAL | Status: DC
  Administered 2013-07-28 – 2013-07-29 (×2): 90 mg via ORAL
  Filled 2013-07-28 (×2): qty 1

## 2013-07-28 MED ORDER — ASPIRIN EC 81 MG PO TBEC
81.0000 mg | DELAYED_RELEASE_TABLET | Freq: Every day | ORAL | Status: DC
Start: 2013-07-29 — End: 2013-07-29
  Administered 2013-07-29: 81 mg via ORAL
  Filled 2013-07-28: qty 1

## 2013-07-28 MED ORDER — INSULIN GLARGINE 100 UNIT/ML ~~LOC~~ SOLN
12.0000 [IU] | Freq: Every day | SUBCUTANEOUS | Status: DC
  Administered 2013-07-28: 12 [IU] via SUBCUTANEOUS
  Filled 2013-07-28 (×2): qty 0.12

## 2013-07-28 MED ORDER — INSULIN ASPART 100 UNIT/ML ~~LOC~~ SOLN
0.0000 [IU] | Freq: Three times a day (TID) | SUBCUTANEOUS | Status: DC
  Administered 2013-07-29: 1 [IU] via SUBCUTANEOUS

## 2013-07-28 MED ORDER — RENA-VITE PO TABS
1.0000 | ORAL_TABLET | Freq: Every day | ORAL | Status: DC
  Administered 2013-07-28 – 2013-07-29 (×2): 1 via ORAL
  Filled 2013-07-28 (×2): qty 1

## 2013-07-28 MED ORDER — ONDANSETRON HCL 4 MG PO TABS: 4.0000 mg | ORAL_TABLET | Freq: Four times a day (QID) | ORAL | Status: DC | PRN

## 2013-07-28 NOTE — ED Notes (Signed)
Family at bedside. 

## 2013-07-28 NOTE — ED Notes (Signed)
According to EMS, patient's daughter called and said her mother was feeling dizzy, SOB, and felt tightness in her chest.  Patient does dialysis at home five times a week.  EMS got there placed her on oxygen and transported here.

## 2013-07-28 NOTE — H&P (Signed)
History and Physical Examination   Scottie Kriston [id number] DOB: [birth year] DOA: 07/28/2013  Referring physician: Dr. Karle Starch PCP: Hayden Rasmussen., MD  Specialists: Dr. Joelyn Oms, nephrologist  Chief Complaint: tightness of chest  Historians: Patient and family   - A Qualified Spanish Interpreter Was Used To Communicate Directly With Patient -   HPI: Karmynn Cote is a 43 y.o. female with ESRD on home HD 5x/week, accelerated HTN, IDDM reports 1 week of having episodes of mild SOB, chest pressure, paresthesias associated with her HD sessions.  She would experience the paresthesias in her shoulders, neck and arms about 1 hour before the end of her HD session.  She reports that she has had more difficult to control diabetes mellitus recently with more frequent hypoglycemia with some BS readings in the low 60s.   She also has very high BS readings 2 hours after meals.  She came to ER today because during her HD session she began to experience worsening tightness and pressure in the center of the chest.  This was associated with some mild SOB and left arm tingling sensations.    She was seen in ER and given 1 sublingual NTG tablet with partial relief of symptoms.  She was given a second NTG tablet and then experienced hypotension.  She was bolused with IVFs and her symptoms improved.  She reports that she has suffered from chronic constipation and has been having this recently and taking miralax with unpredictable results.  A hospitalization was requested for further evaluation and management.   Past Medical History Past Medical History  Diagnosis Date  . Insulin-dependent diabetes mellitus with renal complications   . Hypertension   . ESRD on hemodialysis     Home HD 5x per week  . Depression    Past Surgical History Past Surgical History  Procedure Laterality Date  . Cholecystectomy    . Abdominal hysterectomy    . Breast surgery      biopsy bilateral  . Eye surgery    . Tubal  ligation    . Dialysis fistula creation Left    Home Meds: Prior to Admission medications   Medication Sig Start Date End Date Taking? Authorizing Provider  insulin glargine (LANTUS) 100 UNIT/ML injection Inject 18 Units into the skin at bedtime.   Yes Historical Provider, MD  multivitamin (RENA-VIT) TABS tablet Take 1 tablet by mouth daily.   Yes Historical Provider, MD  NIFEdipine (ADALAT CC) 90 MG 24 hr tablet Take 90 mg by mouth daily.   Yes Historical Provider, MD  sevelamer (RENVELA) 800 MG tablet Take 2,400 mg by mouth 3 (three) times daily with meals.    Yes Historical Provider, MD  valsartan (DIOVAN) 80 MG tablet Take 80 mg by mouth daily.   Yes Historical Provider, MD   Allergies: Review of patient's allergies indicates no known allergies.  Social History:  History   Social History  . Marital Status: Legally Separated    Spouse Name: N/A    Number of Children: N/A  . Years of Education: N/A   Occupational History  . disabled    Social History Main Topics  . Smoking status: Never Smoker   . Smokeless tobacco: Never Used  . Alcohol Use: No  . Drug Use: No  . Sexual Activity: Yes   Other Topics Concern  . Not on file   Social History Narrative  . No narrative on file   Family History:  Family History  Problem Relation Age of Onset  .  Cancer Maternal Grandmother   . Diabetes Maternal Grandfather   . Diabetes Paternal Grandmother   . Diabetes Mother   . Kidney disease Mother   . Diabetes Father   . Heart disease Father   . Kidney disease Father   . Diabetes Sister   . Asthma Sister   . Lupus Sister   . Diabetes Brother    Review of Systems: Review of Systems - General ROS: positive for  - fatigue and malaise ENT ROS: negative for - epistaxis, headaches, hearing change, nasal congestion, nasal discharge, nasal polyps, oral lesions, sinus pain, sneezing, sore throat, tinnitus, vertigo or visual changes Allergy and Immunology ROS: negative for - hives,  itchy/watery eyes, latex, nasal congestion or postnasal drip Hematological and Lymphatic ROS: negative for - blood clots, blood transfusions, fatigue, jaundice, night sweats, pallor or swollen lymph nodes Endocrine ROS: positive for - polydipsia/polyuria, skin changes and temperature intolerance negative for - galactorrhea, hair pattern changes, mood swings or unexpected weight changes Respiratory ROS: positive for - shortness of breath negative for - hemoptysis, pleuritic pain, sputum changes, stridor, tachypnea or wheezing Cardiovascular ROS: positive for - chest pain and shortness of breath negative for - edema, irregular heartbeat, murmur, palpitations or rapid heart rate Gastrointestinal ROS: positive for - constipation negative for - blood in stools, diarrhea or nausea/vomiting Musculoskeletal ROS: negative for - gait disturbance, joint pain, joint stiffness, joint swelling, muscle pain or muscular weakness Neurological ROS: negative for - behavioral changes, bowel and bladder control changes, confusion, dizziness, gait disturbance or memory loss Dermatological ROS: negative for acne, hair changes, lumps, mole changes and nail changes All other systems reviewed and reported negative  Physical Exam: Blood pressure 185/65, pulse 75, height 5\' 1"  (1.549 m), weight 162 lb 0.6 oz (73.5 kg), SpO2 100.00%. General appearance: alert, cooperative, appears stated age and no distress Head: Normocephalic, without obvious abnormality, atraumatic Eyes: negative findings: lids and lashes normal, conjunctivae and sclerae normal, corneas clear, pupils equal, round, reactive to light and accomodation and visual fields full to confrontation Nose: Nares normal. Septum midline. Mucosa normal. No drainage or sinus tenderness. Throat: dry membranes Neck: no adenopathy, no carotid bruit, no JVD, supple, symmetrical, trachea midline and thyroid not enlarged, symmetric, no tenderness/mass/nodules Lungs: clear to  auscultation bilaterally and normal percussion bilaterally Heart: regular rate and rhythm and S1, S2 normal Abdomen: soft, non-tender; bowel sounds normal; no masses,  no organomegaly Extremities: extremities normal, atraumatic, no cyanosis or edema, left arm fistula Pulses: 2+ and symmetric Skin: Skin color, texture, turgor normal. No rashes or lesions Neurologic: Alert and oriented X 3, normal strength and tone. Normal symmetric reflexes. Normal coordination and gait  Lab  And Imaging results:  Results for orders placed during the hospital encounter of 07/28/13 (from the past 24 hour(s))  CBC WITH DIFFERENTIAL     Status: Abnormal   Collection Time    07/28/13  1:35 PM      Result Value Range   WBC 7.4  4.0 - 10.5 K/uL   RBC 3.87  3.87 - 5.11 MIL/uL   Hemoglobin 12.5  12.0 - 15.0 g/dL   HCT 35.0 (*) 36.0 - 46.0 %   MCV 90.4  78.0 - 100.0 fL   MCH 32.3  26.0 - 34.0 pg   MCHC 35.7  30.0 - 36.0 g/dL   RDW 14.7  11.5 - 15.5 %   Platelets 149 (*) 150 - 400 K/uL   Neutrophils Relative % 59  43 - 77 %  Neutro Abs 4.4  1.7 - 7.7 K/uL   Lymphocytes Relative 34  12 - 46 %   Lymphs Abs 2.5  0.7 - 4.0 K/uL   Monocytes Relative 5  3 - 12 %   Monocytes Absolute 0.3  0.1 - 1.0 K/uL   Eosinophils Relative 2  0 - 5 %   Eosinophils Absolute 0.1  0.0 - 0.7 K/uL   Basophils Relative 1  0 - 1 %   Basophils Absolute 0.0  0.0 - 0.1 K/uL  BASIC METABOLIC PANEL     Status: Abnormal   Collection Time    07/28/13  1:35 PM      Result Value Range   Sodium 135 (*) 137 - 147 mEq/L   Potassium 3.7  3.7 - 5.3 mEq/L   Chloride 90 (*) 96 - 112 mEq/L   CO2 20  19 - 32 mEq/L   Glucose, Bld 139 (*) 70 - 99 mg/dL   BUN 50 (*) 6 - 23 mg/dL   Creatinine, Ser 6.23 (*) 0.50 - 1.10 mg/dL   Calcium 10.4  8.4 - 10.5 mg/dL   GFR calc non Af Amer 7 (*) >90 mL/min   GFR calc Af Amer 9 (*) >90 mL/min  TROPONIN I     Status: None   Collection Time    07/28/13  1:35 PM      Result Value Range   Troponin I <0.30   <0.30 ng/mL   Impression / Plan   Chest pain - admit to telemetry, cycle troponins, monitor tele, NTG prn, IVFs, morphine prn pain, oxygen support    ESRD on dialysis - will consult nephrology team for evaluation    Accelerated hypertension -resume home meds, follow    Insulin-dependent diabetes mellitus with renal complications - will resume basal insulin at lower doses, provide small mealtime bolus doses with novolog insulin, supplemental insulin as needed for high blood glucose readings, check A1c. Check TSH, vit D level.     Hypoglycemia - this is very concerning as she has been having this frequently at home, will monitor BS closely AC/HS and 3am. Adjust insulin as needed to avoid further hypoglycemia.   Hyperglycemia - pt reports that this is related to meals as she has high 2 hour postprandial BS readings, as above will provide mealtime bolus coverage to try to prevent this from happening.   Chronic constipation - will provide laxatives for treatment as needed, continue miralax for now    Paresthesias late into HD sessions - will discuss with nephrology team, this could be related to fluid and electrolyte changes during HD.  Will monitor, get neuro checks.      Shortness of breath - monitor oxygen and provide supplemental oxygen as needed, CXR clear for acute findings. Monitor cardiac enzymes  Code Status: FuLL Family Communication: at bedside Disposition: telemetry   Irwin Brakeman MD [place]son [place], [place] digital pager 07/28/2013, 3:34 PM

## 2013-07-28 NOTE — ED Provider Notes (Signed)
CSN: [id number]     Arrival date & time 07/28/13  1314 History   First MD Initiated Contact with Patient 07/28/13 1316     Chief Complaint  Patient presents with  . Shortness of Breath    with dizziness   (Consider location/radiation/quality/duration/timing/severity/associated sxs/prior Treatment) Patient is a 43 y.o. female presenting with shortness of breath.  Shortness of Breath  Level 5 caveat due to language barrier, nurse at bedside provides translation due to urgent nature of complaints Pt with history of ESRD on home HD 5 days a week. Today she had 1.5L taken off per her normal protocol, but then became acutely dizzy, SOB, with chest tightness. Brought by EMS but did not give ASA or NTG enroute. Her L arm fistula is still accessed.   Past Medical History  Diagnosis Date  . Diabetes mellitus   . Hypertension   . Renal disorder     t, th, sat HD  . Depression    Past Surgical History  Procedure Laterality Date  . Cholecystectomy    . Abdominal hysterectomy    . Breast surgery      biopsy bilateral  . Eye surgery    . Tubal ligation    . Dialysis fistula creation Left    Family History  Problem Relation Age of Onset  . Cancer Maternal Grandmother   . Diabetes Maternal Grandfather   . Diabetes Paternal Grandmother   . Diabetes Mother   . Kidney disease Mother   . Diabetes Father   . Heart disease Father   . Kidney disease Father   . Diabetes Sister   . Asthma Sister   . Lupus Sister   . Diabetes Brother    History  Substance Use Topics  . Smoking status: Never Smoker   . Smokeless tobacco: Never Used  . Alcohol Use: No   OB History   Grav Para Term Preterm Abortions TAB SAB Ect Mult Living                 Review of Systems  Respiratory: Positive for shortness of breath.    All other systems reviewed and are negative except as noted in HPI.   Allergies  Review of patient's allergies indicates no known allergies.  Home Medications   Current  Outpatient Rx  Name  Route  Sig  Dispense  Refill  . insulin glargine (LANTUS) 100 UNIT/ML injection   Subcutaneous   Inject 18 Units into the skin at bedtime.         . multivitamin (RENA-VIT) TABS tablet   Oral   Take 1 tablet by mouth daily.         Marland Kitchen NIFEdipine (ADALAT CC) 90 MG 24 hr tablet   Oral   Take 90 mg by mouth daily.         . sevelamer (RENVELA) 800 MG tablet   Oral   Take 2,400 mg by mouth 3 (three) times daily with meals.          . valsartan (DIOVAN) 80 MG tablet   Oral   Take 80 mg by mouth daily.          BP 148/44  Pulse 98  Ht 5\' 1"  (1.549 m)  Wt 162 lb 0.6 oz (73.5 kg)  BMI 30.63 kg/m2  SpO2 100% Physical Exam  Nursing note and vitals reviewed. Constitutional: She is oriented to person, place, and time. She appears well-developed and well-nourished.  Uncomfortable appearing  HENT:  Head: Normocephalic and  atraumatic.  Eyes: EOM are normal. Pupils are equal, round, and reactive to light.  Neck: Normal range of motion. Neck supple.  Cardiovascular: Normal rate, normal heart sounds and intact distal pulses.   Pulmonary/Chest: Effort normal and breath sounds normal. She has no wheezes. She has no rales.  Abdominal: Bowel sounds are normal. She exhibits no distension. There is no tenderness.  Musculoskeletal: Normal range of motion. She exhibits no edema and no tenderness.  Palpable thrill over L arm fistula  Neurological: She is alert and oriented to person, place, and time. She has normal strength. No cranial nerve deficit or sensory deficit.  Skin: Skin is warm and dry. No rash noted.  Psychiatric: She has a normal mood and affect.    ED Course  Procedures (including critical care time) Labs Review Labs Reviewed  CBC WITH DIFFERENTIAL - Abnormal; Notable for the following:    HCT 35.0 (*)    Platelets 149 (*)    All other components within normal limits  BASIC METABOLIC PANEL - Abnormal; Notable for the following:    Sodium 135  (*)    Chloride 90 (*)    Glucose, Bld 139 (*)    BUN 50 (*)    Creatinine, Ser 6.23 (*)    GFR calc non Af Amer 7 (*)    GFR calc Af Amer 9 (*)    All other components within normal limits  TROPONIN I   Imaging Review Dg Chest Port 1 View  07/28/2013   CLINICAL DATA:  Chest pain, shortness of breath  EXAM: PORTABLE CHEST - 1 VIEW  COMPARISON:  DG CHEST 2 VIEW dated 02/10/2013  FINDINGS: The heart size and mediastinal contours are within normal limits. Both lungs are clear. The visualized skeletal structures are unremarkable.  IMPRESSION: No active disease.   Electronically Signed   By: Margaree Mackintosh M.D.   On: 07/28/2013 14:27    EKG Interpretation    Date/Time:  Tuesday July 28 2013 13:30:56 EST Ventricular Rate:  96 PR Interval:  158 QRS Duration: 100 QT Interval:  405 QTC Calculation: 512 R Axis:   -60 Text Interpretation:  Sinus rhythm Probable left ventricular hypertrophy Inferior infarct, old Prolonged QT interval No significant change since last tracing Confirmed by SHELDON  MD, CHARLES ([id number]) on 07/28/2013 1:41:07 PM            MDM   1. Chest pain     CP, SOB after dialysis in patient with HTN, DM and ESRD but no documented CAD. EKG does not show STEMI. Pt had drop in BP after NTG with resultant dizziness. Given ASA, IVF bolus for BP and labs sent for eval.   3:01 PM Pain improved, pt more comfortable now. Labs and imaging reviewed. Will admit for rule out.    Charles B. Karle Starch, MD 07/28/13 1501

## 2013-07-28 NOTE — Progress Notes (Signed)
Removed both HD needles from fistula in left upper arm.  Held pressure for 15 minutes.   Pressure drsgs applied to both sites.   No bleeding noted upon departure.  Primary RN made aware and to keep an eye on drsg for bleeding.

## 2013-07-29 ENCOUNTER — Observation Stay (HOSPITAL_COMMUNITY): Payer: Medicare Other

## 2013-07-29 ENCOUNTER — Encounter (HOSPITAL_COMMUNITY): Payer: Self-pay | Admitting: General Practice

## 2013-07-29 DIAGNOSIS — R079 Chest pain, unspecified: Secondary | ICD-10-CM | POA: Diagnosis not present

## 2013-07-29 LAB — RENAL FUNCTION PANEL
ALBUMIN: 3.2 g/dL — AB (ref 3.5–5.2)
BUN: 63 mg/dL — AB (ref 6–23)
CALCIUM: 8.7 mg/dL (ref 8.4–10.5)
CO2: 24 mEq/L (ref 19–32)
CREATININE: 8.55 mg/dL — AB (ref 0.50–1.10)
Chloride: 96 mEq/L (ref 96–112)
GFR calc Af Amer: 6 mL/min — ABNORMAL LOW (ref >90)
GFR calc non Af Amer: 5 mL/min — ABNORMAL LOW (ref >90)
Glucose, Bld: 156 mg/dL — ABNORMAL HIGH (ref 70–99)
PHOSPHORUS: 5.8 mg/dL — AB (ref 2.3–4.6)
POTASSIUM: 4.5 meq/L (ref 3.7–5.3)
Sodium: 137 mEq/L (ref 137–147)

## 2013-07-29 LAB — CBC
HCT: 29 % — ABNORMAL LOW (ref 36.0–46.0)
Hemoglobin: 10 g/dL — ABNORMAL LOW (ref 12.0–15.0)
MCH: 32.4 pg (ref 26.0–34.0)
MCHC: 34.5 g/dL (ref 30.0–36.0)
MCV: 93.9 fL (ref 78.0–100.0)
PLATELETS: 130 10*3/uL — AB (ref 150–400)
RBC: 3.09 MIL/uL — AB (ref 3.87–5.11)
RDW: 14.8 % (ref 11.5–15.5)
WBC: 5.6 10*3/uL (ref 4.0–10.5)

## 2013-07-29 LAB — TSH: TSH: 3.473 u[IU]/mL (ref 0.350–4.500)

## 2013-07-29 LAB — HEMOGLOBIN A1C
HEMOGLOBIN A1C: 6.5 % — AB (ref <5.7)
Mean Plasma Glucose: 140 mg/dL — ABNORMAL HIGH (ref <117)

## 2013-07-29 LAB — TROPONIN I

## 2013-07-29 LAB — GLUCOSE, CAPILLARY
GLUCOSE-CAPILLARY: 136 mg/dL — AB (ref 70–99)
GLUCOSE-CAPILLARY: 149 mg/dL — AB (ref 70–99)
Glucose-Capillary: 157 mg/dL — ABNORMAL HIGH (ref 70–99)
Glucose-Capillary: 84 mg/dL (ref 70–99)

## 2013-07-29 MED ORDER — LORAZEPAM 1 MG PO TABS
1.0000 mg | ORAL_TABLET | Freq: Once | ORAL | Status: AC
  Administered 2013-07-29: 1 mg via ORAL
  Filled 2013-07-29: qty 1

## 2013-07-29 MED ORDER — TECHNETIUM TC 99M SESTAMIBI GENERIC - CARDIOLITE
10.0000 | Freq: Once | INTRAVENOUS | Status: AC | PRN
  Administered 2013-07-29: 10 via INTRAVENOUS

## 2013-07-29 MED ORDER — TECHNETIUM TC 99M SESTAMIBI GENERIC - CARDIOLITE
30.0000 | Freq: Once | INTRAVENOUS | Status: AC | PRN
  Administered 2013-07-29: 30 via INTRAVENOUS

## 2013-07-29 MED ORDER — ASPIRIN 81 MG PO TBEC: 81.0000 mg | DELAYED_RELEASE_TABLET | Freq: Every day | ORAL | Status: DC

## 2013-07-29 MED ORDER — REGADENOSON 0.4 MG/5ML IV SOLN
0.4000 mg | Freq: Once | INTRAVENOUS | Status: AC
  Administered 2013-07-29: 0.4 mg via INTRAVENOUS
  Filled 2013-07-29: qty 5

## 2013-07-29 MED ORDER — REGADENOSON 0.4 MG/5ML IV SOLN
INTRAVENOUS | Status: AC
  Administered 2013-07-29: 0.4 mg via INTRAVENOUS
  Filled 2013-07-29: qty 5

## 2013-07-29 MED ORDER — INSULIN GLARGINE 100 UNIT/ML ~~LOC~~ SOLN: 12.0000 [IU] | Freq: Every day | SUBCUTANEOUS | Status: DC

## 2013-07-29 NOTE — Discharge Summary (Signed)
Physician Discharge Summary  Cynthia [name] [id number] DOB: 05/18/1971 DOA: 07/28/2013  PCP: Cynthia [name]., MD  Admit date: 07/28/2013 Discharge date: 07/29/2013  Time spent: 35 minutes  Recommendations for Outpatient Follow-up:  1. Follow up with primary nephrologist for further adjustment of dialysis setting.   Discharge Diagnoses:    Chest pain atypical, resolved.    ESRD on dialysis   Accelerated hypertension   Chest pain   Insulin-dependent diabetes mellitus with renal complications   Hypoglycemia   Hyperglycemia   Chronic constipation    Discharge Condition: Stable.   Diet recommendation: Renal diet.   Filed Weights   07/28/13 1355  Weight: 73.5 kg (162 lb 0.6 oz)    History of present illness:  Cynthia [name] is a 43 y.o. female with ESRD on home HD 5x/week, accelerated HTN, IDDM reports 1 week of having episodes of mild SOB, chest pressure, paresthesias associated with her HD sessions. She would experience the paresthesias in her shoulders, neck and arms about 1 hour before the end of her HD session. She reports that she has had more difficult to control diabetes mellitus recently with more frequent hypoglycemia with some BS readings in the low 60s. She also has very high BS readings 2 hours after meals. She came to ER today because during her HD session she began to experience worsening tightness and pressure in the center of the chest. This was associated with some mild SOB and left arm tingling sensations.  She was seen in ER and given 1 sublingual NTG tablet with partial relief of symptoms. She was given a second NTG tablet and then experienced hypotension. She was bolused with IVFs and her symptoms improved. She reports that she has suffered from chronic constipation and has been having this recently and taking miralax with unpredictable results. A hospitalization was requested for further evaluation and management.    Hospital Course:  1-Chest pain; troponin  times 3 negative. EKG with no significant changes. Case discussed with cardiology. Patient underwent Myoview which was negative for ischemia. Chest pain while on dialysis. Her nephrologist discussed with patient dialysis changes. Patient is chest pain free.   2-ESRD; no need for dialysis today. Primary nephrology Dr Cynthia [name] discussed care with patient and family.   3-Diabetes; will decrease home dose lantus due to episode of hypoglycemia at home.   Procedures: Lexiscan nuclear study with no chest pain and no ST changes; images  show probable breast attenuation and no ischemia; gated EF 62% and  wall motion normal.   Consultations:  Cardio  Discharge Exam: Filed Vitals:   07/29/13 1358  BP: 154/54  Pulse: 87  Temp: 97.7 F (36.5 C)  Resp: 18    General: No distress.  Cardiovascular: S 1, S 2 RRR Respiratory: CTA  Discharge Instructions  Discharge Orders   Future Orders Complete By Expires   Diet - low sodium heart healthy  As directed    Increase activity slowly  As directed        Medication List         aspirin 81 MG EC tablet  Take 1 tablet (81 mg total) by mouth daily.     insulin glargine 100 UNIT/ML injection  Commonly known as:  LANTUS  Inject 0.12 mLs (12 Units total) into the skin at bedtime.     multivitamin Tabs tablet  Take 1 tablet by mouth daily.     NIFEdipine 90 MG 24 hr tablet  Commonly known as:  ADALAT CC  Take 90 mg  by mouth daily.     sevelamer carbonate 800 MG tablet  Commonly known as:  RENVELA  Take 2,400 mg by mouth 3 (three) times daily with meals.     valsartan 80 MG tablet  Commonly known as:  DIOVAN  Take 80 mg by mouth daily.       No Known Allergies     Follow-up Information   Follow up with [facility] MD In 1 week.   Specialty:  Family Medicine   Contact information:   [address] [place] [address] [phone]        The results of significant diagnostics from this  hospitalization (including imaging, microbiology, ancillary and laboratory) are listed below for reference.    Significant Diagnostic Studies: Mr Brain Wo Contrast  07/02/2013   CLINICAL DATA:  Acute right-sided hearing loss. Contrast could not be given as the patient is a dialysis patient.  EXAM: MRI HEAD WITHOUT CONTRAST  TECHNIQUE: Multiplanar, multiecho pulse sequences of the brain and surrounding structures were obtained without intravenous contrast.  COMPARISON:  None.  FINDINGS: No acute infarct, hemorrhage, or mass lesion is present. The ventricles are of normal size. No significant extra-axial fluid collection is present. There is no significant white matter disease. Flow is present in the major intracranial arteries. The globes and orbits are intact. The paranasal sinuses and mastoid air cells are clear.  Dedicated imaging of the internal auditory canals demonstrates no focal lesions. The 7th and 8th cranial nerves are well seen and within normal limits. No significant vascular lesions are evident.  A 7 mm subcutaneous cystic lesion is noted in the posterior central neck. There is restricted diffusion. This likely represents a sebaceous cyst or lymph node.  IMPRESSION: 1. Normal MRI of the brain and IAC's without contrast. 2. No acute or focal lesion to explain hearing loss. 3. Decreased T1 marrow signal suggesting anemia. 4. 7 mm subcutaneous cystic lesion likely represents a sebaceous cyst or lymph node.   Electronically Signed   By: Cynthia [name] M.D.   On: 07/02/2013 16:12   Nm Myocar Multi W/spect W/wall Motion / Ef  07/29/2013   CLINICAL DATA:  43 yo female with DM and ESRD with chest pain.  EXAM: MYOCARDIAL IMAGING WITH SPECT (REST AND PHARMACOLOGIC-STRESS)  GATED LEFT VENTRICULAR WALL MOTION STUDY  LEFT VENTRICULAR EJECTION FRACTION  TECHNIQUE: Standard myocardial SPECT imaging was performed after resting intravenous injection of 10 mCi Tc-76m sestamibi. Subsequently, intravenous infusion of  Lexiscan was performed under the supervision of the Cardiology staff. At peak effect of the drug, 30 mCi Tc-37m sestamibi was injected intravenously and standard myocardial SPECT imaging was performed. Quantitative gated imaging was also performed to evaluate left ventricular wall motion, and estimate left ventricular ejection fraction.  COMPARISON:  None.  FINDINGS: Resting HR 82 Resting BP 174/59; Max HR 100 and max BP 168/57. No chest pain or ST changes. Images were reconstructed in the short axis as welll as the vertical and horizontal long axis; stress images reveal a defect in the distal anterior wall and apex. When compared to the rest images, no reversibility noted. TID-1.16; EDV 93 ml; EDV 36 ml.  IMPRESSION: Reading nuclear study with no chest pain and no ST changes; images show probable breast attenuation and no ischemia; gated EF 62% and wall motion normal.   Electronically Signed   By: Phill Mutter   On: 07/29/2013 16:21   Dg Chest Port 1 View  07/28/2013   CLINICAL DATA:  Chest pain, shortness of breath  EXAM: PORTABLE CHEST - 1 VIEW  COMPARISON:  DG CHEST 2 VIEW dated 02/10/2013  FINDINGS: The heart size and mediastinal contours are within normal limits. Both lungs are clear. The visualized skeletal structures are unremarkable.  IMPRESSION: No active disease.   Electronically Signed   By: Margaree Mackintosh M.D.   On: 07/28/2013 14:27    Microbiology: No results found for this or any previous visit (from the past 240 hour(s)).   Labs: Basic Metabolic Panel:  Recent Labs Lab 07/28/13 1335 07/28/13 1715 07/29/13 0335  NA 135*  --  137  K 3.7  --  4.5  CL 90*  --  96  CO2 20  --  24  GLUCOSE 139*  --  156*  BUN 50*  --  63*  CREATININE 6.23* 7.24* 8.55*  CALCIUM 10.4  --  8.7  PHOS  --   --  5.8*   Liver Function Tests:  Recent Labs Lab 07/29/13 0335  ALBUMIN 3.2*   No results found for this basename: LIPASE, AMYLASE,  in the last 168 hours No results found for this  basename: AMMONIA,  in the last 168 hours CBC:  Recent Labs Lab 07/28/13 1335 07/28/13 1715 07/29/13 0335  WBC 7.4 7.0 5.6  NEUTROABS 4.4  --   --   HGB 12.5 10.6* 10.0*  HCT 35.0* 30.7* 29.0*  MCV 90.4 92.2 93.9  PLT 149* 128* 130*   Cardiac Enzymes:  Recent Labs Lab 07/28/13 1335 07/28/13 1840 07/28/13 2155 07/29/13 0335  TROPONINI <0.30 <0.30 <0.30 <0.30   BNP: BNP (last 3 results)  Recent Labs  11/25/12 0140  PROBNP 6670.0*   CBG:  Recent Labs Lab 07/28/13 1704 07/28/13 2058 07/29/13 0407 07/29/13 0742 07/29/13 1629  GLUCAP 92 157* 136* 84 149*       Signed:  Nazariah Cadet  [facility] 07/29/2013, 4:32 PM

## 2013-07-29 NOTE — Progress Notes (Signed)
Discussed with Dr Stanford Breed, tolerated Lexiscan well. Images pending.  Kerin Ransom PA-C 07/29/2013 11:21 AM

## 2013-07-29 NOTE — Progress Notes (Signed)
Agree Cynthia [name]  

## 2013-07-29 NOTE — Discharge Instructions (Signed)
Dolor en el pecho (inespecfico) (Chest Pain, Nonspecific) Frecuentemente es difcil dar un diagnstico especfico de la causa de un dolor en el pecho. Siempre existe una posibilidad de que el dolor est relacionado con algo ms grave como un ataque cardaco o un cogulo en los pulmones. Necesitar realizar un seguimiento con el mdico para Brewing technologist.  CAUSAS  Acidez.  Neumona o bronquitis.  Ansiedad o estrs.  Una inflamacin de la zona que rodea al corazn (pericarditis) o a los pulmones (pleuritis, pleuresa).  Un cogulo sanguneo en el pulmn.  Pulmones colapsados (neumotrax). Puede aparecer de [place] repentina por s solo (neumotrax espontneo) o por un traumatismo en el pecho.  Culebrilla (virus del herpes zster). Las paredes del pecho estn compuestas de [place], msculos y Estate manager/land agent. Cualquiera de estos puede ser fuente del dolor.  Puede haber una contusin en los huesos debido a una lesin.  Puede haber un esguince en los msculos o el cartlago ocasionado por la tos o un esfuerzo.  El cartlago tambin puede verse afectado por una inflamacin y Orthoptist (costocondritis). DIAGNSTICO Puede ser necesario realizar anlisis de laboratorio u otros estudios tales como radiografas, Aeronautical engineer, prueba de esfuerzo, o diagnstico por imgenes para el corazn para determinar la causa de su dolor.  TRATAMIENTO  El tratamiento depender de la causa que provoque el dolor en el pecho. El tratamiento pueden incluir:  Bloqueadores de cido para la acidez estomacal.  Medicamentos antiinflamatorios.  Analgsicos para las enfermedades inflamatorias.  Antibiticos si existe infeccin.  Podrn aconsejarle que modifique su estilo de vida. Esto incluye dejar de fumar y evitar el alcohol, la cafena y el chocolate.  Se le aconsejar que duerma con la cabeza levantada [place]). Esto reduce la probabilidad de que el cido vuelva hacia atrs desde el estmago  hasta el esfago.  [place] veces, Conservation officer, historic buildings en el pecho no especfico mejora dentro de los 2 a 3 das con reposo y medicamentos para Clinical biochemist. INSTRUCCIONES PARA EL CUIDADO DOMICILIARIO  Si le prescriben antibiticos, tmelos tal como se le indic. Tmelos todos, aunque se sienta mejor.  [place], evite la actividad fsica que le hace Patent examiner. Contine con las actividades fsicas tal como se le indic.  No fume.  Evite consumir alcohol.  Slo tome medicamentos de Radio broadcast assistant o prescriptos para Glass blower/designer, las molestias o bajar la fiebre segn las indicaciones de su mdico.  Siga las indicaciones del profesional que lo asiste para un mayor control si los problemas persisten.  Cumpla con todas las visitas de control programadas. Si no lo hace, podr desarrollar problemas permanentes (crnicos) relacionados con el dolor. Si tiene algn problema para asistir a la cita, debe comunicarse con el establecimiento para obtener asistencia. SOLICITE ATENCIN MDICA SI:  Tiene problemas que considere que pueden ser efectos secundarios de los medicamentos que toma. Lea con [place] recomendaciones para su medicacin.  El dolor en el pecho contina incluso despus de haber seguido el tratamiento.  Observa una erupcin en el pecho, que presenta ampollas. SOLICITE ATENCIN [place] DE INMEDIATO SI:  El dolor en el pecho aumenta o se extiende al brazo, cuello, mandbula, espalda o abdomen.  Le falta el aire, aumenta la tos o tose y [place].  Tiene dolor intenso en la espalda o el abdomen, nuseas o vmitos.  Presenta debilidad, desmayos o escalofros.  Tiene fiebre. ESTO ES UNA EMERGENCIA. No espere a que el dolor se vaya. Pida ayuda mdica de  inmediato. Comunquese con el servicio de urgencias de su localidad (911 en los [place]). No maneje solo [employer]. EST SEGURO QUE:   Comprende las instrucciones para el alta  mdica.  Controlar su enfermedad.  Solicitar atencin mdica de inmediato segn las indicaciones. Document Released: 06/11/2005 Document Revised: 09/03/2011 [facility] Patient Information 2014 [place], [place].   Ataque cardaco en la mujer  (Heart Attack in Women) El ataque cardaco (infarto de miocardio) es una de las causas principales de muerte sbita e inesperada en las mujeres. Un infarto de miocardio es una lesin en el corazn que es irreversible. Generalmente ocurre cuando las arterias del corazn (coronarias) se obstruyen o se Investment banker, corporate. La obstruccin corta el suministro de sangre al msculo cardaco. Cuando una o ms arterias coronarias se obstruyen, esa zona comienza a morir. Puede haber dolor durante el ataque cardaco.  Si cree que podra estar sufriendo un infarto, no ignore sus sntomas. Llame a los servicios de emergencia locales (911 en los [place]) inmediatamente Se recomienda que tome 162 mg de aspirina sin [place] entrica si usted no es alrgico a la aspirina. No conduzca por sus propios medios al hospital ni espere para ver si los sntomas desaparecen. El reconocimiento precoz de un infarto es fundamental. Cuanto antes reciba tratamiento, mayor ser la cantidad de msculo cardaco que se salve. El tiempo salva al [employer]. Esto puede salvar su vida.  CAUSAS  Un infarto puede ocurrir por enfermedad coronaria (CAD). La arteriopata coronaria (CAD) es un proceso por el cual las arterias coronarias se obstruyen o se Investment banker, corporate por el desarrollo de la ateroesclerosis. La ateroesclerosis es una enfermedad en la que se forman placas en el interior de las arterias. Las placas son formaciones de grasas (lpidos), colesterol, calcio y tejido fibroso. Un infarto puede ocurrir por:   La acumulacin de placa que puede Psychiatric nurse u obstruir las arterias coronarias y disminuir el flujo sanguneo.  La placa que puede llegar a ser inestable y "romperse". La placa inestable que se rompe  dentro de una arteria coronaria puede formar un cogulo y causar una obstruccin repentina (aguda) de [place].  Un estrechamiento grave (espasmo) de la arteria coronaria. Esta es una causa menos frecuente de ataque cardaco. Cuando una arteria cardaca sufre un espasmo, se detiene el flujo sanguneo por la arteria. Un espasmo puede ocurrir en una arteria cardaca que no tiene aterosclerosis. [place], a medida que el nivel de estrgenos en la sangre disminuye despus de la menopausia, el riesgo de infarto [place]. Otros factores de riesgo de infarto en la mujer son:   Hipertensin arterial.  Niveles elevados de colesterol.  La menopausia  El hbito de fumar.  Obesidad.  Diabetes.  Histerectoma  Ataque cardaco previo.  La falta de [place] fsica regular.  Antecedentes familiares de infarto. SNTOMAS  En las mujeres, los sntomas de un infarto pueden ser diferentes a los de los hombres. Las mujeres no pueden experimentar Health and safety inspector o dolor torcico tpico, que es considerado el principal sntoma de ataque cardaco en los hombres. Las mujeres pueden describir una sensacin de presin, Social research officer, government u opresin en el pecho. Las mujeres pueden experimentar sntomas fsicos nuevos o diferentes un mes o ms antes del infarto. La fatiga inexplicable e inusual puede ser el sntoma ms frecuentemente identificado. Alteraciones del sueo y la debilidad en los brazos tambin pueden ser considerados signos de advertencia.  Otros sntomas de ataque cardaco que pueden ocurrir con ms frecuencia en las mujeres son:  Sensacin inexplicable de nerviosismo o ansiedad.  Molestia [employer].  Hormigueos en las manos y los brazos.  Hinchazn en los brazos.  Dolor de [place]. Los sntomas de infarto tanto en hombres como en mujeres son:   Dolor o molestias que se extienden [employer] cuello, los hombros, el brazo o la [place].  Falta de aire.  Sudor fro  repentino.  Dolor o molestias en el abdomen.  Acidez o indigestin con o sin vmitos.  Mareos repentinos.  Desvanecimiento y desmayo sbito. PREVENCIN  Los siguientes hbitos saludables pueden disminuir el riesgo de infarto.   Deje de fumar.  Mantenga la presin arterial, el nivel de azcar en la sangre y colesterol dentro de lmites normales.  Mantenga un peso saludable.  Mantngase fsicamente activa y haga ejercicios con regularidad.  Limite el consumo de sal.  Consuma una dieta pobre en grasas saturadas y colesterol.  Aumente la ingesta de fibra incluyendo granos enteros, vegetales y frutas en la dieta.  Evite situaciones que causan estrs, enojo o depresin.  Tome los [employer] como le indic el mdico. SOLICITE ATENCIN [place] DE INMEDIATO SI:   Siente un dolor intenso en el pecho, especialmente si lo siente como una opresin y se extiende [employer], la espalda, el cuello o la [place]. Esto es Engineer, maintenance (IT). No espere a que el dolor se vaya. Llame a los servicios de emergencia locales (911 en los [place]) inmediatamente. No conduzca por sus propios medios [employer] hospital.  Siente falta de aire estando en reposo, durmiendo o al Psychologist, occupational.  Comienza a transpirar repentinamente o tiene la piel pegajosa.  Siente nuseas o vomita sin motivo.  Se siente mareada o se desmaya.  Siente que el corazn late rpidamente o nota que "saltea" latidos. ASEGRESE DE QUE:   Comprende estas instrucciones.  Controlar su enfermedad.  Solicitar ayuda de inmediato si no mejora o si empeora. Document Released: 12/11/2011 [facility] Patient Information 2014 [place].

## 2013-07-29 NOTE — Progress Notes (Signed)
UR completed 

## 2013-09-23 ENCOUNTER — Encounter: Payer: Self-pay | Admitting: Vascular Surgery

## 2013-09-23 ENCOUNTER — Encounter (INDEPENDENT_AMBULATORY_CARE_PROVIDER_SITE_OTHER): Payer: Self-pay

## 2013-09-23 ENCOUNTER — Ambulatory Visit (INDEPENDENT_AMBULATORY_CARE_PROVIDER_SITE_OTHER): Payer: Medicare Other | Admitting: Vascular Surgery

## 2013-09-23 VITALS — BP 165/72 | HR 97 | Resp 16 | Ht 61.0 in | Wt 167.0 lb

## 2013-09-23 DIAGNOSIS — N186 End stage renal disease: Secondary | ICD-10-CM | POA: Insufficient documentation

## 2013-09-23 NOTE — Progress Notes (Signed)
   Patient name: Cynthia [name] MRN: [id number] DOB: [birth year] Sex: female  REASON FOR VISIT: Eschar on left upper arm fistula  HPI: Cynthia [name] is a 43 y.o. female has a left upper arm fistula which she uses for dialysis. She's had this for many years. She dialyzes 5 days a week so her schedule is somewhat flexible. She had a fistulogram today by Dr. Augustin Coupe. She had angioplasty of a stenosis in the cephalic vein. He was concerned about eschar in the central portion of the fistula and she was seen as an add-on today. The history is obtained through her daughter who translates. She states that the eschar has been present for one month. There is no erythema or drainage from this area noted. She denies fever or chills.  REVIEW OF SYSTEMS: Valu.Nieves ] denotes positive finding; [  ] denotes negative finding  CARDIOVASCULAR:  [ ]  chest pain   [ ]  dyspnea on exertion    CONSTITUTIONAL:  [ ]  fever   [ ]  chills  PHYSICAL EXAM: Filed Vitals:   09/23/13 1245  BP: 165/72  Pulse: 97  Resp: 16  Height: 5\' 1"  (1.549 m)  Weight: 167 lb (75.751 kg)   Body mass index is 31.57 kg/(m^2). GENERAL: The patient is a well-nourished female, in no acute distress. The vital signs are documented above. CARDIOVASCULAR: There is a regular rate and rhythm. PULMONARY: There is good air exchange bilaterally without wheezing or rales. She has a large aneurysmal left upper arm fistula. There is an eschar that measures approximate a centimeter in diameter in the central portion of the fistula.  I have reviewed her fistulogram which shows a patent fistula and successful venoplasty of a stenosis in the cephalic vein.  MEDICAL ISSUES:  End stage renal disease The fistula reportedly is functioning adequately. The eschar on the fistula has been present for a month, however, I think this should be addressed given the risk for bleeding. The eschar is fairly thick and there is no suggestion of erythema or infection. We will try to  schedule for for excision of this eschar early next week hopefully on Monday or Tuesday. Given that this is been present for a month and does not appear to be infected, and the eschar appears fairly well incorporated I do not think that this needs to be done urgently. I have instructed the patient on importance of digital pressure over the area if she did experience bleeding from this area. I think if eschar can simply be excised and is probably enough soft tissue to cover the vein here allowing continued use of the fistula.   [facility] [place] Beeper: [phone]

## 2013-09-23 NOTE — Assessment & Plan Note (Signed)
The fistula reportedly is functioning adequately. The eschar on the fistula has been present for a month, however, I think this should be addressed given the risk for bleeding. The eschar is fairly thick and there is no suggestion of erythema or infection. We will try to schedule for for excision of this eschar early next week hopefully on Monday or Tuesday. Given that this is been present for a month and does not appear to be infected, and the eschar appears fairly well incorporated I do not think that this needs to be done urgently. I have instructed the patient on importance of digital pressure over the area if she did experience bleeding from this area. I think if eschar can simply be excised and is probably enough soft tissue to cover the vein here allowing continued use of the fistula.

## 2013-09-24 ENCOUNTER — Other Ambulatory Visit: Payer: Self-pay

## 2013-09-24 ENCOUNTER — Encounter (HOSPITAL_COMMUNITY): Payer: Self-pay | Admitting: *Deleted

## 2013-09-24 NOTE — Progress Notes (Signed)
I spoke to patient through Telephonic Interpreting, interpretor # [phone].

## 2013-09-27 MED ORDER — SODIUM CHLORIDE 0.9 % IV SOLN
INTRAVENOUS | Status: DC
Start: 2013-09-27 — End: 2013-09-28
  Administered 2013-09-28: 14:00:00 via INTRAVENOUS
  Administered 2013-09-28: 20 mL/h via INTRAVENOUS

## 2013-09-27 MED ORDER — CHLORHEXIDINE GLUCONATE CLOTH 2 % EX PADS: 6.0000 | MEDICATED_PAD | Freq: Once | CUTANEOUS | Status: DC

## 2013-09-27 MED ORDER — DEXTROSE 5 % IV SOLN
1.5000 g | INTRAVENOUS | Status: AC
  Administered 2013-09-28: 1.5 g via INTRAVENOUS
  Filled 2013-09-27: qty 1.5

## 2013-09-28 ENCOUNTER — Ambulatory Visit (HOSPITAL_COMMUNITY)
Admission: RE | Admit: 2013-09-28 | Discharge: 2013-09-28 | Disposition: A | Discharge status: Home / Self Care | Payer: Medicare Other | Source: Ambulatory Visit | Attending: Vascular Surgery | Admitting: Vascular Surgery

## 2013-09-28 ENCOUNTER — Encounter (HOSPITAL_COMMUNITY): Payer: Self-pay | Admitting: *Deleted

## 2013-09-28 ENCOUNTER — Encounter (HOSPITAL_COMMUNITY): Payer: Medicare Other | Admitting: Anesthesiology

## 2013-09-28 ENCOUNTER — Telehealth: Payer: Self-pay | Admitting: Vascular Surgery

## 2013-09-28 ENCOUNTER — Encounter (HOSPITAL_COMMUNITY)
Admission: RE | Disposition: A | Discharge status: Home / Self Care | Payer: Self-pay | Source: Ambulatory Visit | Attending: Vascular Surgery

## 2013-09-28 ENCOUNTER — Ambulatory Visit (HOSPITAL_COMMUNITY): Payer: Medicare Other | Admitting: Anesthesiology

## 2013-09-28 DIAGNOSIS — E669 Obesity, unspecified: Secondary | ICD-10-CM | POA: Insufficient documentation

## 2013-09-28 DIAGNOSIS — Z992 Dependence on renal dialysis: Secondary | ICD-10-CM | POA: Insufficient documentation

## 2013-09-28 DIAGNOSIS — F329 Major depressive disorder, single episode, unspecified: Secondary | ICD-10-CM | POA: Insufficient documentation

## 2013-09-28 DIAGNOSIS — Y832 Surgical operation with anastomosis, bypass or graft as the cause of abnormal reaction of the patient, or of later complication, without mention of misadventure at the time of the procedure: Secondary | ICD-10-CM | POA: Insufficient documentation

## 2013-09-28 DIAGNOSIS — T82898A Other specified complication of vascular prosthetic devices, implants and grafts, initial encounter: Secondary | ICD-10-CM

## 2013-09-28 DIAGNOSIS — N186 End stage renal disease: Secondary | ICD-10-CM | POA: Insufficient documentation

## 2013-09-28 DIAGNOSIS — Z6831 Body mass index (BMI) 31.0-31.9, adult: Secondary | ICD-10-CM | POA: Insufficient documentation

## 2013-09-28 DIAGNOSIS — E119 Type 2 diabetes mellitus without complications: Secondary | ICD-10-CM | POA: Insufficient documentation

## 2013-09-28 DIAGNOSIS — I120 Hypertensive chronic kidney disease with stage 5 chronic kidney disease or end stage renal disease: Secondary | ICD-10-CM | POA: Insufficient documentation

## 2013-09-28 DIAGNOSIS — F3289 Other specified depressive episodes: Secondary | ICD-10-CM | POA: Insufficient documentation

## 2013-09-28 HISTORY — PX: REVISON OF ARTERIOVENOUS FISTULA: SHX6074

## 2013-09-28 LAB — GLUCOSE, CAPILLARY: Glucose-Capillary: 102 mg/dL — ABNORMAL HIGH (ref 70–99)

## 2013-09-28 LAB — POCT I-STAT 4, (NA,K, GLUC, HGB,HCT)
Glucose, Bld: 125 mg/dL — ABNORMAL HIGH (ref 70–99)
HCT: 30 % — ABNORMAL LOW (ref 36.0–46.0)
Hemoglobin: 10.2 g/dL — ABNORMAL LOW (ref 12.0–15.0)
Potassium: 4.2 mEq/L (ref 3.7–5.3)
Sodium: 138 mEq/L (ref 137–147)

## 2013-09-28 SURGERY — REVISON OF ARTERIOVENOUS FISTULA
Anesthesia: Monitor Anesthesia Care | Site: Arm Upper | Laterality: Left

## 2013-09-28 MED ORDER — HYDROMORPHONE HCL PF 1 MG/ML IJ SOLN
0.2500 mg | INTRAMUSCULAR | Status: DC | PRN
  Administered 2013-09-28 (×4): 0.5 mg via INTRAVENOUS

## 2013-09-28 MED ORDER — HEPARIN SODIUM (PORCINE) 1000 UNIT/ML IJ SOLN
INTRAMUSCULAR | Status: DC | PRN
  Administered 2013-09-28: 5000 [IU] via INTRAVENOUS

## 2013-09-28 MED ORDER — HEPARIN SODIUM (PORCINE) 1000 UNIT/ML IJ SOLN
INTRAMUSCULAR | Status: AC
  Filled 2013-09-28: qty 1

## 2013-09-28 MED ORDER — ROCURONIUM BROMIDE 50 MG/5ML IV SOLN
INTRAVENOUS | Status: AC
  Filled 2013-09-28: qty 1

## 2013-09-28 MED ORDER — OXYCODONE HCL 5 MG PO TABS
5.0000 mg | ORAL_TABLET | Freq: Once | ORAL | Status: AC | PRN
  Administered 2013-09-28: 5 mg via ORAL

## 2013-09-28 MED ORDER — FENTANYL CITRATE 0.05 MG/ML IJ SOLN: 50.0000 ug | Freq: Once | INTRAMUSCULAR | Status: DC

## 2013-09-28 MED ORDER — THROMBIN 20000 UNITS EX SOLR
CUTANEOUS | Status: AC
  Filled 2013-09-28: qty 20000

## 2013-09-28 MED ORDER — MIDAZOLAM HCL 2 MG/2ML IJ SOLN
INTRAMUSCULAR | Status: AC
  Filled 2013-09-28: qty 2

## 2013-09-28 MED ORDER — OXYCODONE HCL 5 MG PO TABS
ORAL_TABLET | ORAL | Status: AC
  Administered 2013-09-28: 5 mg via ORAL
  Filled 2013-09-28: qty 1

## 2013-09-28 MED ORDER — LIDOCAINE HCL (PF) 1 % IJ SOLN: INTRAMUSCULAR | Status: DC | PRN

## 2013-09-28 MED ORDER — SODIUM CHLORIDE 0.9 % IR SOLN
Status: DC | PRN
  Administered 2013-09-28: 14:00:00

## 2013-09-28 MED ORDER — PROTAMINE SULFATE 10 MG/ML IV SOLN
INTRAVENOUS | Status: AC
  Filled 2013-09-28: qty 5

## 2013-09-28 MED ORDER — PROTAMINE SULFATE 10 MG/ML IV SOLN
INTRAVENOUS | Status: DC | PRN
  Administered 2013-09-28: 20 mg via INTRAVENOUS
  Administered 2013-09-28: 10 mg via INTRAVENOUS
  Administered 2013-09-28 (×2): 20 mg via INTRAVENOUS

## 2013-09-28 MED ORDER — PROPOFOL 10 MG/ML IV BOLUS
INTRAVENOUS | Status: AC
  Filled 2013-09-28: qty 20

## 2013-09-28 MED ORDER — HYDROMORPHONE HCL PF 1 MG/ML IJ SOLN
INTRAMUSCULAR | Status: AC
  Administered 2013-09-28: 0.5 mg via INTRAVENOUS
  Filled 2013-09-28: qty 1

## 2013-09-28 MED ORDER — SODIUM CHLORIDE 0.9 % IV SOLN: INTRAVENOUS | Status: DC

## 2013-09-28 MED ORDER — ONDANSETRON HCL 4 MG/2ML IJ SOLN
INTRAMUSCULAR | Status: DC | PRN
Start: 2013-09-28 — End: 2013-09-28
  Administered 2013-09-28: 4 mg via INTRAVENOUS

## 2013-09-28 MED ORDER — LABETALOL HCL 5 MG/ML IV SOLN
INTRAVENOUS | Status: AC
  Filled 2013-09-28: qty 4

## 2013-09-28 MED ORDER — OXYCODONE HCL 5 MG/5ML PO SOLN: 5.0000 mg | Freq: Once | ORAL | Status: AC | PRN

## 2013-09-28 MED ORDER — PROPOFOL 10 MG/ML IV BOLUS
INTRAVENOUS | Status: DC | PRN
  Administered 2013-09-28: 160 mg via INTRAVENOUS

## 2013-09-28 MED ORDER — LIDOCAINE HCL (PF) 1 % IJ SOLN
INTRAMUSCULAR | Status: AC
  Filled 2013-09-28: qty 30

## 2013-09-28 MED ORDER — 0.9 % SODIUM CHLORIDE (POUR BTL) OPTIME
TOPICAL | Status: DC | PRN
Start: 2013-09-28 — End: 2013-09-28
  Administered 2013-09-28: 1000 mL

## 2013-09-28 MED ORDER — LIDOCAINE HCL (CARDIAC) 20 MG/ML IV SOLN
INTRAVENOUS | Status: AC
  Filled 2013-09-28: qty 5

## 2013-09-28 MED ORDER — OXYCODONE HCL 5 MG PO TABS: 5.0000 mg | ORAL_TABLET | Freq: Four times a day (QID) | ORAL | Status: DC | PRN

## 2013-09-28 MED ORDER — MIDAZOLAM HCL 2 MG/2ML IJ SOLN
1.0000 mg | INTRAMUSCULAR | Status: DC | PRN
Start: 2013-09-28 — End: 2013-09-28

## 2013-09-28 MED ORDER — FENTANYL CITRATE 0.05 MG/ML IJ SOLN
INTRAMUSCULAR | Status: DC | PRN
  Administered 2013-09-28: 25 ug via INTRAVENOUS
  Administered 2013-09-28: 75 ug via INTRAVENOUS
  Administered 2013-09-28 (×2): 25 ug via INTRAVENOUS

## 2013-09-28 MED ORDER — LIDOCAINE HCL (CARDIAC) 20 MG/ML IV SOLN
INTRAVENOUS | Status: DC | PRN
  Administered 2013-09-28: 80 mg via INTRAVENOUS

## 2013-09-28 MED ORDER — ONDANSETRON HCL 4 MG/2ML IJ SOLN
INTRAMUSCULAR | Status: AC
  Filled 2013-09-28: qty 2

## 2013-09-28 MED ORDER — MIDAZOLAM HCL 5 MG/5ML IJ SOLN
INTRAMUSCULAR | Status: DC | PRN
  Administered 2013-09-28: 2 mg via INTRAVENOUS

## 2013-09-28 MED ORDER — FENTANYL CITRATE 0.05 MG/ML IJ SOLN
INTRAMUSCULAR | Status: AC
  Filled 2013-09-28: qty 5

## 2013-09-28 SURGICAL SUPPLY — 38 items
ADH SKN CLS APL DERMABOND .7 (GAUZE/BANDAGES/DRESSINGS) ×1
BANDAGE ELASTIC 4 VELCRO ST LF (GAUZE/BANDAGES/DRESSINGS) ×2 IMPLANT
CANISTER SUCTION 2500CC (MISCELLANEOUS) ×2 IMPLANT
CLIP TI MEDIUM 6 (CLIP) IMPLANT
CLIP TI WIDE RED SMALL 6 (CLIP) IMPLANT
COVER PROBE W GEL 5X96 (DRAPES) IMPLANT
COVER SURGICAL LIGHT HANDLE (MISCELLANEOUS) ×2 IMPLANT
DECANTER SPIKE VIAL GLASS SM (MISCELLANEOUS) IMPLANT
DERMABOND ADVANCED (GAUZE/BANDAGES/DRESSINGS) ×1
DERMABOND ADVANCED .7 DNX12 (GAUZE/BANDAGES/DRESSINGS) ×1 IMPLANT
DRAIN PENROSE 1/4X12 LTX STRL (WOUND CARE) IMPLANT
ELECT REM PT RETURN 9FT ADLT (ELECTROSURGICAL) ×2
ELECTRODE REM PT RTRN 9FT ADLT (ELECTROSURGICAL) ×1 IMPLANT
GEL ULTRASOUND 20GR AQUASONIC (MISCELLANEOUS) IMPLANT
GLOVE BIO SURGEON STRL SZ 6.5 (GLOVE) ×4 IMPLANT
GLOVE BIO SURGEON STRL SZ7.5 (GLOVE) ×2 IMPLANT
GLOVE BIOGEL PI IND STRL 6.5 (GLOVE) ×1 IMPLANT
GLOVE BIOGEL PI IND STRL 7.0 (GLOVE) ×3 IMPLANT
GLOVE BIOGEL PI INDICATOR 6.5 (GLOVE) ×1
GLOVE BIOGEL PI INDICATOR 7.0 (GLOVE) ×3
GOWN STRL REUS W/ TWL LRG LVL3 (GOWN DISPOSABLE) ×4 IMPLANT
GOWN STRL REUS W/TWL LRG LVL3 (GOWN DISPOSABLE) ×8
KIT BASIN OR (CUSTOM PROCEDURE TRAY) ×2 IMPLANT
KIT ROOM TURNOVER OR (KITS) ×2 IMPLANT
LOOP VESSEL MINI RED (MISCELLANEOUS) IMPLANT
NS IRRIG 1000ML POUR BTL (IV SOLUTION) ×2 IMPLANT
PACK CV ACCESS (CUSTOM PROCEDURE TRAY) ×2 IMPLANT
PAD ARMBOARD 7.5X6 YLW CONV (MISCELLANEOUS) ×4 IMPLANT
SPONGE SURGIFOAM ABS GEL 100 (HEMOSTASIS) IMPLANT
SUT PROLENE 5 0 C 1 24 (SUTURE) ×2 IMPLANT
SUT PROLENE 7 0 BV 1 (SUTURE) IMPLANT
SUT VIC AB 3-0 SH 27 (SUTURE) ×1
SUT VIC AB 3-0 SH 27X BRD (SUTURE) ×1 IMPLANT
SUT VICRYL 4-0 PS2 18IN ABS (SUTURE) ×2 IMPLANT
TOWEL OR 17X24 6PK STRL BLUE (TOWEL DISPOSABLE) ×2 IMPLANT
TOWEL OR 17X26 10 PK STRL BLUE (TOWEL DISPOSABLE) ×2 IMPLANT
UNDERPAD 30X30 INCONTINENT (UNDERPADS AND DIAPERS) ×2 IMPLANT
WATER STERILE IRR 1000ML POUR (IV SOLUTION) ×2 IMPLANT

## 2013-09-28 NOTE — Anesthesia Preprocedure Evaluation (Addendum)
Anesthesia Evaluation  Patient identified by MRN, date of birth, ID band Patient awake    Reviewed: Allergy & Precautions, H&P , NPO status , Patient's Chart, lab work & pertinent test results  Airway Mallampati: II TM Distance: <3 FB Neck ROM: Full    Dental   Pulmonary shortness of breath,  breath sounds clear to auscultation        Cardiovascular hypertension, Rhythm:Regular Rate:Normal     Neuro/Psych Depression    GI/Hepatic   Endo/Other  diabetes  Renal/GU Dialysis and ESRFRenal disease     Musculoskeletal   Abdominal (+) + obese,   Peds  Hematology   Anesthesia Other Findings   Reproductive/Obstetrics                         Anesthesia Physical Anesthesia Plan  ASA: III  Anesthesia Plan: General   Post-op Pain Management:    Induction: Intravenous  Airway Management Planned: LMA  Additional Equipment:   Intra-op Plan:   Post-operative Plan: Extubation in OR  Informed Consent: I have reviewed the patients History and Physical, chart, labs and discussed the procedure including the risks, benefits and alternatives for the proposed anesthesia with the patient or authorized representative who has indicated his/her understanding and acceptance.     Plan Discussed with: CRNA and Surgeon  Anesthesia Plan Comments:       Anesthesia Quick Evaluation

## 2013-09-28 NOTE — Telephone Encounter (Addendum)
Message copied by Doristine Section on Mon Sep 28, 2013  4:20 PM ------      Message from: Peter Minium K      Created: Mon Sep 28, 2013  4:07 PM      Regarding: Schedule                   ----- Message -----         From: Gabriel Earing, PA-C         Sent: 09/28/2013   2:27 PM           To: [facility] Charge Pool            S/p plication of left arm AVF 09/28/13.  F/u with Dr. Oneida Alar in 4 weeks.                  Thanks,      Aldona Bar ------  notified patient of  post op appt. with dr. Oneida Alar 10-29-13 8 ;30 am

## 2013-09-28 NOTE — Op Note (Signed)
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  Assistant: Leontine Locket PA-C  Operative findings: #1 middle third of left upper extremity AV fistula           #2 excision of ulcerated segment of AV fistula  Operative details: After obtaining informed consent, the patient was taken to the operating room. The patient was placed in supine position on the operating room table. After administration of general anesthesia, the entire left upper extremity was prepped and draped in usual sterile fashion. A longitudinal incision was made over the central third of the fistula incorporating an area of aneurysmal degeneration. The fistula was dissected free circumferentially throughout its course.  The patient was given 7000 units of intravenous heparin. The fistula was clamped proximally and distally above and below the aneurysm. The aneurysm was opened longitudinally and the redundant segment fistula excised. The area of resection was then reapproximated using a running 5-0 Prolene suture. The clamps were removed and flow restored to the fistula. Hemostasis was obtained with administration of 70 mg of protamine.The subcutaneous tissues were reapproximated using a running 3-0 Vicryl suture. The skin was closed with a 4 0 Vicryl subcuticular stitch. Dermabond was applied to the skin incision.  The patient tolerated procedure well and there were no complications. Instrument sponge and needle counts were correct at the end of the case. The patient was taken to the recovery room in stable condition.   Ruta Hinds, MD [facility] Office: [phone] Pager: [phone]

## 2013-09-28 NOTE — Anesthesia Procedure Notes (Signed)
Procedure Name: LMA Insertion Date/Time: 09/28/2013 1:05 PM Performed by: Julian Reil Pre-anesthesia Checklist: Patient identified, Emergency Drugs available, Suction available and Patient being monitored Patient Re-evaluated:Patient Re-evaluated prior to inductionOxygen Delivery Method: Circle system utilized Preoxygenation: Pre-oxygenation with 100% oxygen Intubation Type: IV induction LMA: LMA inserted LMA Size: 4.0 Tube type: Oral Number of attempts: 1 Placement Confirmation: positive ETCO2 and breath sounds checked- equal and bilateral Tube secured with: Tape Dental Injury: Teeth and Oropharynx as per pre-operative assessment

## 2013-09-28 NOTE — Interval H&P Note (Signed)
History and Physical Interval Note:  09/28/2013 12:53 PM  Cynthia [name]  has presented today for surgery, with the diagnosis of End Stage Renal Disease Complications of Arteriovenous Fistula  The various methods of treatment have been discussed with the patient and family. After consideration of risks, benefits and other options for treatment, the patient has consented to  Procedure(s): EXCISE ESCHAR LEFT ARTERIOVENOUS FISTULA (Left) as a surgical intervention .  The patient's history has been reviewed, patient examined, no change in status, stable for surgery.  I have reviewed the patient's chart and labs.  Questions were answered to the patient's satisfaction.     Waymon Laser E

## 2013-09-28 NOTE — Transfer of Care (Signed)
Immediate Anesthesia Transfer of Care Note   Patient: Cynthia [name]  Procedure(s) Performed: Procedure(s): EXCISE ESCHAR LEFT ARM  ARTERIOVENOUS FISTULA WITH PLICATION OF LEFT ARM ARTERIOVENOUS FISTULA (Left)  Patient Location: PACU  Anesthesia Type:General  Level of Consciousness: awake, alert , oriented and patient cooperative  Airway & Oxygen Therapy: Patient Spontanous Breathing and Patient connected to nasal cannula oxygen  Post-op Assessment: Report given to PACU RN, Post -op Vital signs reviewed and stable and Patient moving all extremities  Post vital signs: Reviewed and stable  Complications: No apparent anesthesia complications

## 2013-09-28 NOTE — H&P (View-Only) (Signed)
   Patient name: Cynthia [name] MRN: [id number] DOB: 1970/07/10 Sex: female  REASON FOR VISIT: Eschar on left upper arm fistula  HPI: Cynthia [name] is a 43 y.o. female has a left upper arm fistula which she uses for dialysis. She's had this for many years. She dialyzes 5 days a week so her schedule is somewhat flexible. She had a fistulogram today by Dr. Augustin Coupe. She had angioplasty of a stenosis in the cephalic vein. He was concerned about eschar in the central portion of the fistula and she was seen as an add-on today. The history is obtained through her daughter who translates. She states that the eschar has been present for one month. There is no erythema or drainage from this area noted. She denies fever or chills.  REVIEW OF SYSTEMS: Valu.Nieves ] denotes positive finding; [  ] denotes negative finding  CARDIOVASCULAR:  [ ]  chest pain   [ ]  dyspnea on exertion    CONSTITUTIONAL:  [ ]  fever   [ ]  chills  PHYSICAL EXAM: Filed Vitals:   09/23/13 1245  BP: 165/72  Pulse: 97  Resp: 16  Height: 5\' 1"  (1.549 m)  Weight: 167 lb (75.751 kg)   Body mass index is 31.57 kg/(m^2). GENERAL: The patient is a well-nourished female, in no acute distress. The vital signs are documented above. CARDIOVASCULAR: There is a regular rate and rhythm. PULMONARY: There is good air exchange bilaterally without wheezing or rales. She has a large aneurysmal left upper arm fistula. There is an eschar that measures approximate a centimeter in diameter in the central portion of the fistula.  I have reviewed her fistulogram which shows a patent fistula and successful venoplasty of a stenosis in the cephalic vein.  MEDICAL ISSUES:  End stage renal disease The fistula reportedly is functioning adequately. The eschar on the fistula has been present for a month, however, I think this should be addressed given the risk for bleeding. The eschar is fairly thick and there is no suggestion of erythema or infection. We will try to  schedule for for excision of this eschar early next week hopefully on Monday or Tuesday. Given that this is been present for a month and does not appear to be infected, and the eschar appears fairly well incorporated I do not think that this needs to be done urgently. I have instructed the patient on importance of digital pressure over the area if she did experience bleeding from this area. I think if eschar can simply be excised and is probably enough soft tissue to cover the vein here allowing continued use of the fistula.   [place] [facility] [place] Beeper: [phone]

## 2013-09-28 NOTE — Discharge Instructions (Signed)
° ° °  09/28/2013 Cynthia [name] [id number] [birth year]  Surgeon(s): Elam Dutch, MD  Procedure(s): EXCISE [facility] LEFT ARM  ARTERIOVENOUS FISTULA WITH PLICATION OF LEFT ARM ARTERIOVENOUS FISTULA   May stick graft immediately  x May stick graft on designated area only: May stick graft above and below incision, but do not stick over incision for 8 weeks.  See diagram.  x Do not stick graft for 4 weeks over the incision     What to eat:  For your first meals, you should eat lightly; only small meals initially.  If you do not have nausea, you may eat larger meals.  Avoid spicy, greasy and heavy food.    General Anesthesia, Adult, Care After  Refer to this sheet in the next few weeks. These instructions provide you with information on caring for yourself after your procedure. Your health care provider may also give you more specific instructions. Your treatment has been planned according to current medical practices, but problems sometimes occur. Call your health care provider if you have any problems or questions after your procedure.  WHAT TO EXPECT AFTER THE PROCEDURE  After the procedure, it is typical to experience:  Sleepiness.  Nausea and vomiting. HOME CARE INSTRUCTIONS  For the first 24 hours after general anesthesia:  Have a responsible person with you.  Do not drive a car. If you are alone, do not take public transportation.  Do not drink alcohol.  Do not take medicine that has not been prescribed by your health care provider.  Do not sign important papers or make important decisions.  You may resume a normal diet and activities as directed by your health care provider.  Change bandages (dressings) as directed.  If you have questions or problems that seem related to general anesthesia, call the hospital and ask for the anesthetist or anesthesiologist on call. SEEK MEDICAL CARE IF:  You have nausea and vomiting that continue the day after anesthesia.  You develop a  rash. SEEK IMMEDIATE MEDICAL CARE IF:  You have difficulty breathing.  You have chest pain.  You have any allergic problems. Document Released: 09/17/2000 Document Revised: 02/11/2013 Document Reviewed: 12/25/2012  [facility] Patient Information 2014 [place], [place].

## 2013-09-29 LAB — GLUCOSE, CAPILLARY: GLUCOSE-CAPILLARY: 112 mg/dL — AB (ref 70–99)

## 2013-09-29 NOTE — Anesthesia Postprocedure Evaluation (Signed)
  Anesthesia Post-op Note  Patient: Cynthia [name]  Procedure(s) Performed: Procedure(s): EXCISE ESCHAR LEFT ARM  ARTERIOVENOUS FISTULA WITH PLICATION OF LEFT ARM ARTERIOVENOUS FISTULA (Left)  Patient Location: PACU  Anesthesia Type:General  Level of Consciousness: awake  Airway and Oxygen Therapy: Patient Spontanous Breathing  Post-op Pain: mild  Post-op Assessment: Post-op Vital signs reviewed, Patient's Cardiovascular Status Stable, Respiratory Function Stable, Patent Airway, No signs of Nausea or vomiting and Pain level controlled  Post-op Vital Signs: Reviewed and stable  Complications: No apparent anesthesia complications

## 2013-09-30 ENCOUNTER — Encounter (HOSPITAL_COMMUNITY): Payer: Self-pay | Admitting: Vascular Surgery

## 2013-10-14 ENCOUNTER — Encounter: Payer: Self-pay | Admitting: Internal Medicine

## 2013-10-28 ENCOUNTER — Encounter: Payer: Self-pay | Admitting: Vascular Surgery

## 2013-10-29 ENCOUNTER — Ambulatory Visit: Payer: Medicare Other | Admitting: Vascular Surgery

## 2013-11-04 ENCOUNTER — Encounter: Payer: Self-pay | Admitting: Vascular Surgery

## 2013-11-05 ENCOUNTER — Encounter: Payer: Self-pay | Admitting: Vascular Surgery

## 2013-11-05 ENCOUNTER — Ambulatory Visit (INDEPENDENT_AMBULATORY_CARE_PROVIDER_SITE_OTHER): Payer: Medicare Other | Admitting: Vascular Surgery

## 2013-11-05 VITALS — BP 169/63 | HR 80 | Ht 61.0 in | Wt 167.4 lb

## 2013-11-05 DIAGNOSIS — N186 End stage renal disease: Secondary | ICD-10-CM

## 2013-11-05 NOTE — Progress Notes (Signed)
Patient is a 43 year old female who underwent plication of her left upper arm AV fistula on April 6. She returns for followup today. Her daughter does home dialysis with her using a buttonhole technique. She states occasionally she has some difficulty cannulating the fistula proximally where it is aneurysmal. She is taking above the area of recent repair and forming a new buttonhole distally.  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 She has aneurysmal degeneration of the proximal aspect of the fistula. However the daughter currently is not having difficulty with this and there is no skin breakdown.  Plan: The patient will continue to cannulate the fistula as they are doing. If she starts to have difficulty with cannulation or breakdown of the skin she will return for consideration of plication of the proximal aspect of the fistula.  Ruta Hinds, MD [facility] [place] Office: [phone] Pager: [phone]

## 2013-11-15 ENCOUNTER — Encounter (HOSPITAL_COMMUNITY): Payer: Self-pay | Admitting: Emergency Medicine

## 2013-11-15 DIAGNOSIS — F329 Major depressive disorder, single episode, unspecified: Secondary | ICD-10-CM | POA: Diagnosis present

## 2013-11-15 DIAGNOSIS — N186 End stage renal disease: Secondary | ICD-10-CM | POA: Diagnosis present

## 2013-11-15 DIAGNOSIS — Z9089 Acquired absence of other organs: Secondary | ICD-10-CM

## 2013-11-15 DIAGNOSIS — E875 Hyperkalemia: Secondary | ICD-10-CM | POA: Diagnosis present

## 2013-11-15 DIAGNOSIS — Z841 Family history of disorders of kidney and ureter: Secondary | ICD-10-CM

## 2013-11-15 DIAGNOSIS — Z992 Dependence on renal dialysis: Secondary | ICD-10-CM

## 2013-11-15 DIAGNOSIS — Z79899 Other long term (current) drug therapy: Secondary | ICD-10-CM

## 2013-11-15 DIAGNOSIS — Z8249 Family history of ischemic heart disease and other diseases of the circulatory system: Secondary | ICD-10-CM

## 2013-11-15 DIAGNOSIS — Z833 Family history of diabetes mellitus: Secondary | ICD-10-CM

## 2013-11-15 DIAGNOSIS — E1129 Type 2 diabetes mellitus with other diabetic kidney complication: Secondary | ICD-10-CM | POA: Diagnosis present

## 2013-11-15 DIAGNOSIS — J810 Acute pulmonary edema: Principal | ICD-10-CM | POA: Diagnosis present

## 2013-11-15 DIAGNOSIS — Z794 Long term (current) use of insulin: Secondary | ICD-10-CM

## 2013-11-15 DIAGNOSIS — F3289 Other specified depressive episodes: Secondary | ICD-10-CM | POA: Diagnosis present

## 2013-11-15 DIAGNOSIS — E1169 Type 2 diabetes mellitus with other specified complication: Secondary | ICD-10-CM | POA: Diagnosis present

## 2013-11-15 DIAGNOSIS — Z825 Family history of asthma and other chronic lower respiratory diseases: Secondary | ICD-10-CM

## 2013-11-15 DIAGNOSIS — Z9851 Tubal ligation status: Secondary | ICD-10-CM

## 2013-11-15 DIAGNOSIS — I120 Hypertensive chronic kidney disease with stage 5 chronic kidney disease or end stage renal disease: Secondary | ICD-10-CM | POA: Diagnosis present

## 2013-11-15 DIAGNOSIS — K219 Gastro-esophageal reflux disease without esophagitis: Secondary | ICD-10-CM | POA: Diagnosis present

## 2013-11-15 DIAGNOSIS — N058 Unspecified nephritic syndrome with other morphologic changes: Secondary | ICD-10-CM | POA: Diagnosis present

## 2013-11-15 LAB — CBC
HEMATOCRIT: 30 % — AB (ref 36.0–46.0)
HEMOGLOBIN: 10.1 g/dL — AB (ref 12.0–15.0)
MCH: 31.9 pg (ref 26.0–34.0)
MCHC: 33.7 g/dL (ref 30.0–36.0)
MCV: 94.6 fL (ref 78.0–100.0)
Platelets: 138 10*3/uL — ABNORMAL LOW (ref 150–400)
RBC: 3.17 MIL/uL — AB (ref 3.87–5.11)
RDW: 15.4 % (ref 11.5–15.5)
WBC: 6.5 10*3/uL (ref 4.0–10.5)

## 2013-11-15 LAB — I-STAT TROPONIN, ED: Troponin i, poc: 0 ng/mL (ref 0.00–0.08)

## 2013-11-15 NOTE — ED Notes (Signed)
C/o sob, chills, and nausea that started tonight.  Pt does hemodialysis at home and states her machine was not working Midwife.  Last dialysis on Friday.

## 2013-11-16 ENCOUNTER — Inpatient Hospital Stay (HOSPITAL_COMMUNITY)
Admission: EM | Admit: 2013-11-16 | Discharge: 2013-11-17 | DRG: 189 | Disposition: A | Discharge status: Home / Self Care | Payer: Medicare Other | Attending: Internal Medicine | Admitting: Internal Medicine

## 2013-11-16 ENCOUNTER — Emergency Department (HOSPITAL_COMMUNITY): Payer: Medicare Other | Attending: Emergency Medicine

## 2013-11-16 DIAGNOSIS — K5909 Other constipation: Secondary | ICD-10-CM

## 2013-11-16 DIAGNOSIS — IMO0001 Reserved for inherently not codable concepts without codable children: Secondary | ICD-10-CM

## 2013-11-16 DIAGNOSIS — E109 Type 1 diabetes mellitus without complications: Secondary | ICD-10-CM

## 2013-11-16 DIAGNOSIS — J811 Chronic pulmonary edema: Secondary | ICD-10-CM | POA: Diagnosis not present

## 2013-11-16 DIAGNOSIS — R739 Hyperglycemia, unspecified: Secondary | ICD-10-CM

## 2013-11-16 DIAGNOSIS — N186 End stage renal disease: Secondary | ICD-10-CM | POA: Diagnosis not present

## 2013-11-16 DIAGNOSIS — E162 Hypoglycemia, unspecified: Secondary | ICD-10-CM

## 2013-11-16 DIAGNOSIS — Z7682 Awaiting organ transplant status: Secondary | ICD-10-CM

## 2013-11-16 DIAGNOSIS — E875 Hyperkalemia: Secondary | ICD-10-CM

## 2013-11-16 DIAGNOSIS — R0602 Shortness of breath: Secondary | ICD-10-CM

## 2013-11-16 DIAGNOSIS — I10 Essential (primary) hypertension: Secondary | ICD-10-CM | POA: Diagnosis not present

## 2013-11-16 DIAGNOSIS — Z992 Dependence on renal dialysis: Secondary | ICD-10-CM | POA: Diagnosis present

## 2013-11-16 DIAGNOSIS — I951 Orthostatic hypotension: Secondary | ICD-10-CM

## 2013-11-16 DIAGNOSIS — E119 Type 2 diabetes mellitus without complications: Secondary | ICD-10-CM

## 2013-11-16 DIAGNOSIS — J810 Acute pulmonary edema: Secondary | ICD-10-CM | POA: Diagnosis not present

## 2013-11-16 DIAGNOSIS — R0789 Other chest pain: Secondary | ICD-10-CM

## 2013-11-16 DIAGNOSIS — R079 Chest pain, unspecified: Secondary | ICD-10-CM

## 2013-11-16 DIAGNOSIS — E1129 Type 2 diabetes mellitus with other diabetic kidney complication: Secondary | ICD-10-CM

## 2013-11-16 DIAGNOSIS — Z794 Long term (current) use of insulin: Secondary | ICD-10-CM

## 2013-11-16 DIAGNOSIS — R202 Paresthesia of skin: Secondary | ICD-10-CM

## 2013-11-16 DIAGNOSIS — G589 Mononeuropathy, unspecified: Secondary | ICD-10-CM

## 2013-11-16 LAB — BASIC METABOLIC PANEL
BUN: 107 mg/dL — AB (ref 6–23)
CHLORIDE: 94 meq/L — AB (ref 96–112)
CO2: 24 meq/L (ref 19–32)
CREATININE: 13.5 mg/dL — AB (ref 0.50–1.10)
Calcium: 8.7 mg/dL (ref 8.4–10.5)
GFR calc Af Amer: 3 mL/min — ABNORMAL LOW (ref >90)
GFR calc non Af Amer: 3 mL/min — ABNORMAL LOW (ref >90)
GLUCOSE: 182 mg/dL — AB (ref 70–99)
Potassium: 6.7 mEq/L (ref 3.7–5.3)
Sodium: 137 mEq/L (ref 137–147)

## 2013-11-16 LAB — CBG MONITORING, ED
GLUCOSE-CAPILLARY: 77 mg/dL (ref 70–99)
GLUCOSE-CAPILLARY: 133 mg/dL — AB (ref 70–99)
GLUCOSE-CAPILLARY: 161 mg/dL — AB (ref 70–99)
Glucose-Capillary: 198 mg/dL — ABNORMAL HIGH (ref 70–99)
Glucose-Capillary: 28 mg/dL — CL (ref 70–99)
Glucose-Capillary: 53 mg/dL — ABNORMAL LOW (ref 70–99)

## 2013-11-16 LAB — CBC
HEMATOCRIT: 29.8 % — AB (ref 36.0–46.0)
Hemoglobin: 10.1 g/dL — ABNORMAL LOW (ref 12.0–15.0)
MCH: 31.7 pg (ref 26.0–34.0)
MCHC: 33.9 g/dL (ref 30.0–36.0)
MCV: 93.4 fL (ref 78.0–100.0)
Platelets: 129 10*3/uL — ABNORMAL LOW (ref 150–400)
RBC: 3.19 MIL/uL — AB (ref 3.87–5.11)
RDW: 15.6 % — AB (ref 11.5–15.5)
WBC: 8.7 10*3/uL (ref 4.0–10.5)

## 2013-11-16 LAB — HEMOGLOBIN A1C
HEMOGLOBIN A1C: 5.8 % — AB (ref <5.7)
MEAN PLASMA GLUCOSE: 120 mg/dL — AB (ref <117)

## 2013-11-16 LAB — CREATININE, SERUM
Creatinine, Ser: 13.8 mg/dL — ABNORMAL HIGH (ref 0.50–1.10)
GFR calc non Af Amer: 3 mL/min — ABNORMAL LOW (ref >90)
GFR, EST AFRICAN AMERICAN: 3 mL/min — AB (ref >90)

## 2013-11-16 LAB — PRO B NATRIURETIC PEPTIDE: Pro B Natriuretic peptide (BNP): 17575 pg/mL — ABNORMAL HIGH (ref 0–125)

## 2013-11-16 LAB — GLUCOSE, CAPILLARY
GLUCOSE-CAPILLARY: 116 mg/dL — AB (ref 70–99)
Glucose-Capillary: 144 mg/dL — ABNORMAL HIGH (ref 70–99)
Glucose-Capillary: 192 mg/dL — ABNORMAL HIGH (ref 70–99)

## 2013-11-16 LAB — MRSA PCR SCREENING: MRSA BY PCR: NEGATIVE

## 2013-11-16 LAB — HEPATITIS B SURFACE ANTIGEN: Hepatitis B Surface Ag: NEGATIVE

## 2013-11-16 MED ORDER — SODIUM CHLORIDE 0.9 % IV SOLN: 100.0000 mL | INTRAVENOUS | Status: DC | PRN

## 2013-11-16 MED ORDER — LIDOCAINE-PRILOCAINE 2.5-2.5 % EX CREA
1.0000 "application " | TOPICAL_CREAM | CUTANEOUS | Status: DC | PRN
  Filled 2013-11-16: qty 5

## 2013-11-16 MED ORDER — INSULIN ASPART 100 UNIT/ML ~~LOC~~ SOLN: 0.0000 [IU] | Freq: Every day | SUBCUTANEOUS | Status: DC

## 2013-11-16 MED ORDER — SEVELAMER CARBONATE 800 MG PO TABS
2400.0000 mg | ORAL_TABLET | Freq: Three times a day (TID) | ORAL | Status: DC
  Administered 2013-11-16 – 2013-11-17 (×3): 2400 mg via ORAL
  Filled 2013-11-16 (×6): qty 3

## 2013-11-16 MED ORDER — PREDNISOLONE ACETATE 1 % OP SUSP
1.0000 [drp] | Freq: Four times a day (QID) | OPHTHALMIC | Status: DC
  Administered 2013-11-16 – 2013-11-17 (×5): 1 [drp] via OPHTHALMIC
  Filled 2013-11-16: qty 1

## 2013-11-16 MED ORDER — DEXTROSE 50 % IV SOLN
INTRAVENOUS | Status: AC
  Administered 2013-11-16: 50 mL via INTRAVENOUS
  Filled 2013-11-16: qty 50

## 2013-11-16 MED ORDER — SODIUM POLYSTYRENE SULFONATE 15 GM/60ML PO SUSP
15.0000 g | Freq: Once | ORAL | Status: AC
  Administered 2013-11-16: 15 g via ORAL
  Filled 2013-11-16: qty 60

## 2013-11-16 MED ORDER — RENA-VITE PO TABS
1.0000 | ORAL_TABLET | Freq: Every day | ORAL | Status: DC
  Filled 2013-11-16: qty 1

## 2013-11-16 MED ORDER — RENA-VITE PO TABS
1.0000 | ORAL_TABLET | Freq: Every day | ORAL | Status: DC
  Administered 2013-11-16: 1 via ORAL
  Filled 2013-11-16 (×2): qty 1

## 2013-11-16 MED ORDER — HYDROMORPHONE HCL PF 1 MG/ML IJ SOLN: 1.0000 mg | INTRAMUSCULAR | Status: DC | PRN

## 2013-11-16 MED ORDER — INSULIN ASPART 100 UNIT/ML ~~LOC~~ SOLN
0.0000 [IU] | Freq: Three times a day (TID) | SUBCUTANEOUS | Status: DC
  Administered 2013-11-16: 1 [IU] via SUBCUTANEOUS

## 2013-11-16 MED ORDER — INSULIN GLARGINE 100 UNIT/ML ~~LOC~~ SOLN
12.0000 [IU] | Freq: Every day | SUBCUTANEOUS | Status: DC
  Administered 2013-11-16: 12 [IU] via SUBCUTANEOUS
  Filled 2013-11-16 (×2): qty 0.12

## 2013-11-16 MED ORDER — DARBEPOETIN ALFA-POLYSORBATE 60 MCG/0.3ML IJ SOLN
60.0000 ug | INTRAMUSCULAR | Status: DC
  Administered 2013-11-16: 60 ug via INTRAVENOUS

## 2013-11-16 MED ORDER — SODIUM CHLORIDE 0.9 % IV SOLN
1.0000 g | Freq: Once | INTRAVENOUS | Status: AC
  Administered 2013-11-16: 1 g via INTRAVENOUS
  Filled 2013-11-16: qty 10

## 2013-11-16 MED ORDER — DOCUSATE SODIUM 100 MG PO CAPS
100.0000 mg | ORAL_CAPSULE | Freq: Two times a day (BID) | ORAL | Status: DC
  Administered 2013-11-16 – 2013-11-17 (×3): 100 mg via ORAL
  Filled 2013-11-16 (×4): qty 1

## 2013-11-16 MED ORDER — LIDOCAINE HCL (PF) 1 % IJ SOLN: 5.0000 mL | INTRAMUSCULAR | Status: DC | PRN

## 2013-11-16 MED ORDER — PENTAFLUOROPROP-TETRAFLUOROETH EX AERO: 1.0000 "application " | INHALATION_SPRAY | CUTANEOUS | Status: DC | PRN

## 2013-11-16 MED ORDER — ONDANSETRON HCL 4 MG PO TABS: 4.0000 mg | ORAL_TABLET | Freq: Four times a day (QID) | ORAL | Status: DC | PRN

## 2013-11-16 MED ORDER — IRBESARTAN 75 MG PO TABS
75.0000 mg | ORAL_TABLET | Freq: Every day | ORAL | Status: DC
  Administered 2013-11-16 – 2013-11-17 (×2): 75 mg via ORAL
  Filled 2013-11-16 (×2): qty 1

## 2013-11-16 MED ORDER — DEXTROSE 50 % IV SOLN
INTRAVENOUS | Status: AC
  Administered 2013-11-16: 50 mL
  Filled 2013-11-16: qty 50

## 2013-11-16 MED ORDER — HEPARIN SODIUM (PORCINE) 1000 UNIT/ML DIALYSIS
1000.0000 [IU] | INTRAMUSCULAR | Status: DC | PRN
  Filled 2013-11-16: qty 1

## 2013-11-16 MED ORDER — ACETAMINOPHEN 325 MG PO TABS
650.0000 mg | ORAL_TABLET | Freq: Four times a day (QID) | ORAL | Status: DC | PRN
  Filled 2013-11-16: qty 2

## 2013-11-16 MED ORDER — DARBEPOETIN ALFA-POLYSORBATE 60 MCG/0.3ML IJ SOLN
INTRAMUSCULAR | Status: AC
  Administered 2013-11-16: 60 ug via INTRAVENOUS
  Filled 2013-11-16: qty 0.3

## 2013-11-16 MED ORDER — ACETAMINOPHEN 650 MG RE SUPP: 650.0000 mg | Freq: Four times a day (QID) | RECTAL | Status: DC | PRN

## 2013-11-16 MED ORDER — INSULIN ASPART 100 UNIT/ML IV SOLN
10.0000 [IU] | Freq: Once | INTRAVENOUS | Status: AC
  Administered 2013-11-16: 10 [IU] via INTRAVENOUS
  Filled 2013-11-16: qty 0.1

## 2013-11-16 MED ORDER — ALTEPLASE 2 MG IJ SOLR
2.0000 mg | Freq: Once | INTRAMUSCULAR | Status: DC | PRN
  Filled 2013-11-16: qty 2

## 2013-11-16 MED ORDER — SODIUM POLYSTYRENE SULFONATE 15 GM/60ML PO SUSP
60.0000 g | Freq: Once | ORAL | Status: AC
  Administered 2013-11-16: 60 g via ORAL
  Filled 2013-11-16: qty 240

## 2013-11-16 MED ORDER — ONDANSETRON HCL 4 MG/2ML IJ SOLN
4.0000 mg | Freq: Four times a day (QID) | INTRAMUSCULAR | Status: DC | PRN
  Administered 2013-11-16: 4 mg via INTRAVENOUS
  Filled 2013-11-16: qty 2

## 2013-11-16 MED ORDER — ALPRAZOLAM 0.25 MG PO TABS: 0.2500 mg | ORAL_TABLET | Freq: Two times a day (BID) | ORAL | Status: DC | PRN

## 2013-11-16 MED ORDER — DEXTROSE 50 % IV SOLN
50.0000 mL | Freq: Once | INTRAVENOUS | Status: AC
  Administered 2013-11-16: 50 mL via INTRAVENOUS
  Filled 2013-11-16: qty 50

## 2013-11-16 MED ORDER — DEXTROSE 10 % IV SOLN
INTRAVENOUS | Status: DC
  Administered 2013-11-16: 1000 mL via INTRAVENOUS
  Administered 2013-11-17: 11:00:00 via INTRAVENOUS

## 2013-11-16 MED ORDER — NIFEDIPINE ER OSMOTIC RELEASE 90 MG PO TB24
90.0000 mg | ORAL_TABLET | Freq: Every day | ORAL | Status: DC
  Administered 2013-11-16 – 2013-11-17 (×2): 90 mg via ORAL
  Filled 2013-11-16 (×3): qty 1

## 2013-11-16 MED ORDER — GABAPENTIN 100 MG PO CAPS
100.0000 mg | ORAL_CAPSULE | Freq: Every day | ORAL | Status: DC
  Administered 2013-11-16: 100 mg via ORAL
  Filled 2013-11-16 (×3): qty 1

## 2013-11-16 MED ORDER — ENOXAPARIN SODIUM 30 MG/0.3ML ~~LOC~~ SOLN
30.0000 mg | SUBCUTANEOUS | Status: DC
  Administered 2013-11-16 – 2013-11-17 (×2): 30 mg via SUBCUTANEOUS
  Filled 2013-11-16 (×3): qty 0.3

## 2013-11-16 MED ORDER — DEXTROSE 50 % IV SOLN
50.0000 mL | Freq: Once | INTRAVENOUS | Status: AC
  Administered 2013-11-16: 50 mL via INTRAVENOUS

## 2013-11-16 MED ORDER — ONDANSETRON HCL 4 MG/2ML IJ SOLN
4.0000 mg | Freq: Once | INTRAMUSCULAR | Status: AC
  Administered 2013-11-16: 4 mg via INTRAVENOUS
  Filled 2013-11-16: qty 2

## 2013-11-16 MED ORDER — HYDROCODONE-ACETAMINOPHEN 5-325 MG PO TABS: 1.0000 | ORAL_TABLET | ORAL | Status: DC | PRN

## 2013-11-16 MED ORDER — ALUM & MAG HYDROXIDE-SIMETH 200-200-20 MG/5ML PO SUSP: 30.0000 mL | Freq: Four times a day (QID) | ORAL | Status: DC | PRN

## 2013-11-16 MED ORDER — NEPRO/CARBSTEADY PO LIQD: 237.0000 mL | ORAL | Status: DC | PRN

## 2013-11-16 NOTE — H&P (Signed)
History and Physical       Hospital Admission Note Date: 11/16/2013  Patient name: Cynthia [name] Medical record number: [id number] Date of birth: [birth year] Age: 43 y.o. 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 Patient reported that last dialysis was on Thursday 5/21, she could not dialyze on Friday as her machine was broken. Over the weekend patient shortness of breath has been getting worse, having cough with pinkish phlegm, no fevers or chills or any chest pain. She also feels nauseated but denies any vomiting. She reports being compliant with all her medications. BMET showed potassium of 6.7, BUN 107, creatinine 13.5, BNP 17,575  Review of Systems:  Constitutional: Denies fever, chills, diaphoresis, poor appetite and fatigue.  HEENT: Denies photophobia, eye pain, redness, hearing loss, ear pain,+ congestion, denies sore throat, rhinorrhea, sneezing, mouth sores, trouble swallowing, neck pain, neck stiffness and tinnitus.   Respiratory: Please see history of present illness Cardiovascular: Denies chest pain, palpitations and leg swelling.  Gastrointestinal: Denies nausea, vomiting, abdominal pain, diarrhea, constipation, blood in stool and abdominal distention.  Genitourinary: Denies dysuria, urgency, frequency, hematuria, flank pain and difficulty urinating.  Musculoskeletal: Denies myalgias, back pain, joint swelling, arthralgias and gait problem.  Skin: Denies pallor, rash and wound.  Neurological: Denies dizziness, seizures, syncope, weakness, light-headedness, numbness and headaches.  Hematological: Denies adenopathy. Easy bruising, personal or family bleeding history   Psychiatric/Behavioral: Denies suicidal ideation, mood changes, confusion, nervousness, sleep disturbance and agitation  Past Medical History: Past Medical History  Diagnosis Date  . Hypertension   . ESRD on hemodialysis     Home HD 5x per week  . Depression   . Insulin-dependent diabetes mellitus with renal complications     INSULIN DEPENDENT  . GERD (gastroesophageal reflux disease)   . Neuromuscular disorder     NEUROPATHY  . History of blood transfusion reaction 07/29/13   Past Surgical History  Procedure Laterality Date  . Cholecystectomy    . Abdominal hysterectomy    . Breast surgery      biopsy bilateral  . Tubal ligation    . Dialysis fistula creation Left   . Eye surgery Bilateral     lazer  . Revison of arteriovenous fistula Left 09/28/2013    Procedure: EXCISE ESCHAR LEFT ARM  ARTERIOVENOUS FISTULA WITH PLICATION OF LEFT ARM ARTERIOVENOUS FISTULA;  Surgeon: Elam Dutch, MD;  Location: [place];  Service: Vascular;  Laterality: Left;    Medications: Prior to Admission medications   Medication Sig Start Date End Date Taking? Authorizing Provider  ALPRAZolam (XANAX) 0.25 MG tablet Take 0.25 mg by mouth 2 (two) times daily as needed for anxiety.   Yes Historical Provider, MD  docusate sodium (COLACE) 100 MG capsule Take 100 mg by mouth 2 (two) times daily.   Yes Historical Provider, MD  gabapentin (NEURONTIN) 100 MG capsule Take 100 mg by mouth at bedtime.   Yes Historical Provider, MD  insulin glargine (LANTUS) 100 UNIT/ML injection Inject 0.12 mLs (12 Units total) into the skin at bedtime. 07/29/13  Yes Belkys A Regalado, MD  multivitamin (RENA-VIT) TABS tablet Take 1 tablet by mouth daily.   Yes Historical Provider, MD  NIFEdipine (ADALAT CC) 90 MG 24 hr tablet Take 90 mg by mouth daily.   Yes  Historical Provider, MD  prednisoLONE acetate (PRED FORTE) 1 % ophthalmic suspension Place 1 drop into the right eye 4 (four) times daily.   Yes Historical Provider, MD   sevelamer (RENVELA) 800 MG tablet Take 2,400 mg by mouth 3 (three) times daily with meals.    Yes Historical Provider, MD  valsartan (DIOVAN) 80 MG tablet Take 80 mg by mouth daily.   Yes Historical Provider, MD    Allergies:  No Known Allergies  Social History:  reports that she has never smoked. She has never used smokeless tobacco. She reports that she does not drink alcohol or use illicit drugs.  Family History: Family History  Problem Relation Age of Onset  . Cancer Maternal Grandmother   . Diabetes Maternal Grandfather   . Diabetes Paternal Grandmother   . Diabetes Mother   . Kidney disease Mother   . Diabetes Father   . Heart disease Father   . Kidney disease Father   . Diabetes Sister   . Asthma Sister   . Lupus Sister   . Diabetes Brother     Physical Exam: Blood pressure 178/70, pulse 87, temperature 98.2 F (36.8 C), temperature source Oral, resp. rate 20, height 5\' 1"  (1.549 m), weight 77 kg (169 lb 12.1 oz), SpO2 93.00%. General: Alert, awake, oriented x3, in mild distress but able to speak in full sentences HEENT: normocephalic, atraumatic, anicteric sclera, pink conjunctiva, PERLA, oropharynx clear Neck: supple, no masses or lymphadenopathy, no goiter, no bruits  Heart: Regular rate and rhythm, without murmurs, rubs or gallops. Lungs: Rhonchorous with full of crackles Abdomen: Soft, nontender, nondistended, positive bowel sounds, no masses. Extremities: No clubbing, cyanosis or edema with positive pedal pulses. Left upper arm graft Neuro: Grossly intact, no focal neurological deficits, strength 5/5 upper and lower extremities bilaterally Psych: alert and oriented x 3, anxious Skin: no rashes or lesions, warm and dry   LABS on Admission:  Basic Metabolic Panel:  Recent Labs Lab 11/15/13 2256  NA 137  K 6.7*  CL 94*  CO2 24  GLUCOSE 182*  BUN 107*  CREATININE 13.50*  CALCIUM 8.7   Liver Function Tests: No results found for this basename: AST,  ALT, ALKPHOS, BILITOT, PROT, ALBUMIN,  in the last 168 hours No results found for this basename: LIPASE, AMYLASE,  in the last 168 hours No results found for this basename: AMMONIA,  in the last 168 hours CBC:  Recent Labs Lab 11/15/13 2256  WBC 6.5  HGB 10.1*  HCT 30.0*  MCV 94.6  PLT 138*   Cardiac Enzymes: No results found for this basename: CKTOTAL, CKMB, CKMBINDEX, TROPONINI,  in the last 168 hours BNP: No components found with this basename: POCBNP,  CBG:  Recent Labs Lab 11/16/13 0439 11/16/13 0519  GLUCAP 161* 133*     Radiological Exams on Admission: Dg Chest 2 View  11/16/2013   CLINICAL DATA:  Shortness of breath, feels like fluid on lungs, dialysis patient who has missed two dialysis treatments, history hypertension, diabetes  EXAM: CHEST  2 VIEW  COMPARISON:  07/28/2013  FINDINGS: Enlargement of cardiac silhouette with pulmonary vascular congestion.  Increased perihilar markings versus previous exam consistent with mild perihilar edema.  No segmental consolidation, pleural effusion or pneumothorax.  Surgical clips RIGHT upper quadrant consistent with history of cholecystectomy.  No acute osseous findings.  IMPRESSION: Mild perihilar edema.   Electronically Signed   By: Lavonia Dana M.D.   On: 11/16/2013 00:52    Assessment/Plan Principal Problem:  Acute pulmonary edema, hyperkalemia secondary to missed dialysis, ESRD - Patient will need urgent hemodialysis, discussed with Dr. Arty Baumgartner who will arrange for dialysis today - Patient was given calcium gluconate, insulin, D50 and Kayexalate for the potassium in the ER - Patient also reports her hemodialysis machine is broken, will need nephrology input on how to arrange in the new machine or outpatient hemodialysis center until she is able to obtain new hemodialysis machine  Active Problems:   ESRD on dialysis - As #1, Nephrology has been consulted     Insulin-dependent diabetes mellitus with renal  complications - Place on sliding scale insulin and Lantus     Hypoglycemia - Patient had an episode of hypoglycemia in ED with CBG of 28 due to insulin given for hyperkalemia, which has improved     Hyperkalemia - Patient has received calcium gluconate, insulin, D50 and Kayexalate in ED, can recheck labs after hemodialysis   DVT prophylaxis:  Lovenox   CODE STATUS:  Full code  Family Communication: Admission, patients condition and plan of care including tests being ordered have been discussed with the patient who indicates understanding and agree with the plan and Code Status   Further plan will depend as patient's clinical course evolves and further radiologic and laboratory data become available.   Time Spent on Admission: 1 hour  Paulita Licklider Krystal Eaton M.D. [facility] 11/16/2013, 7:32 AM Pager: [phone]  If 7PM-7AM, please contact night-coverage [email or web address] Password [email or web address]  **Disclaimer: This note was dictated with voice recognition software. Similar sounding words can inadvertently be transcribed and this note may contain transcription errors which may not have been corrected upon publication of note.**

## 2013-11-16 NOTE — ED Notes (Signed)
Pt actively vomiting. Pt given emesis bag and cold cloth. MD made aware.

## 2013-11-16 NOTE — Progress Notes (Signed)
Patient received from ED around 0600 CHG bath given, D10 running at 25 ml/hr. No acute distress at the moment, pt resting in bed.

## 2013-11-16 NOTE — ED Notes (Signed)
Pt complains of feeling dizzy and not well, given orange juice and crackers with peanut butter, order per dr Cheri Guppy, pt cbg rechecked and dropped to 77 and then 53, dr Cheri Guppy orders a 2nd amp of d 20.

## 2013-11-16 NOTE — ED Notes (Signed)
MD at bedside. 

## 2013-11-16 NOTE — Procedures (Signed)
Patient was seen on dialysis and the procedure was supervised. BFR 400 Via LUE AVF BP is 180/100.  Patient appears to be tolerating treatment well and feels better.

## 2013-11-16 NOTE — ED Notes (Signed)
Pt eating [place] sandwhich

## 2013-11-16 NOTE — Consult Note (Signed)
Reason for Consult:hyperkalemia and volume overload Referring Physician: Tana Coast, MD  Cynthia [name] is an 43 y.o. female.  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  It is unclear why she did not get all of her treatments as [facility] reports that she did have extra bags of fluid, and they also provide in-center dialysis if needed.  She denies any N/V/CP but is due to an endoscopy tomorrow but doesn't know which doctor is supposed to perform this.   We were asked to see the patient to help manage her ESRD, hyperkalemia and volume overload.  Trend in Creatinine: Creatinine, Ser  Date/Time Value Ref Range Status  11/16/2013  8:00 AM 13.80* 0.50 - 1.10 mg/dL Final  11/15/2013 10:56 PM 13.50* 0.50 - 1.10 mg/dL Final  07/29/2013  3:35 AM 8.55* 0.50 - 1.10 mg/dL Final  07/28/2013  5:15 PM 7.24* 0.50 - 1.10 mg/dL Final  07/28/2013  1:35 PM 6.23* 0.50 - 1.10 mg/dL Final  02/10/2013  5:36 PM 7.40* 0.50 - 1.10 mg/dL Final  02/10/2013  1:45 PM 6.70* 0.50 - 1.10 mg/dL Final  11/25/2012  5:30 AM 14.05* 0.50 - 1.10 mg/dL Final  11/24/2012 11:22 PM 13.40* 0.50 - 1.10 mg/dL Final  08/22/2012 12:30 AM 7.22* 0.50 - 1.10 mg/dL Final  03/31/2012 11:18 AM 11.03* 0.50 - 1.10 mg/dL Final    PMH:   Past Medical History  Diagnosis Date  . Hypertension   . ESRD on hemodialysis     Home HD 5x per week  . Depression   . Insulin-dependent diabetes mellitus with renal complications     INSULIN DEPENDENT  . GERD (gastroesophageal reflux disease)   . Neuromuscular disorder     NEUROPATHY  . History of blood transfusion reaction 07/29/13    PSH:   Past Surgical History  Procedure Laterality Date  . Cholecystectomy    . Abdominal hysterectomy    . Breast  surgery      biopsy bilateral  . Tubal ligation    . Dialysis fistula creation Left   . Eye surgery Bilateral     lazer  . Revison of arteriovenous fistula Left 09/28/2013    Procedure: EXCISE ESCHAR LEFT ARM  ARTERIOVENOUS FISTULA WITH PLICATION OF LEFT ARM ARTERIOVENOUS FISTULA;  Surgeon: Elam Dutch, MD;  Location: [place];  Service: Vascular;  Laterality: Left;    Allergies: No Known Allergies  Medications:   Prior to Admission medications   Medication Sig Start Date End Date Taking? Authorizing Provider  ALPRAZolam (XANAX) 0.25 MG tablet Take 0.25 mg by mouth 2 (two) times daily as needed for anxiety.   Yes Historical Provider, MD  docusate sodium (COLACE) 100 MG capsule Take 100 mg by mouth 2 (two) times daily.   Yes Historical Provider, MD  gabapentin (NEURONTIN) 100 MG capsule Take 100 mg by mouth at bedtime.   Yes Historical Provider, MD  insulin glargine (LANTUS) 100 UNIT/ML injection Inject 0.12 mLs (12 Units total) into the skin at bedtime. 07/29/13  Yes Belkys A Regalado, MD  multivitamin (RENA-VIT) TABS tablet Take 1 tablet by mouth daily.   Yes Historical Provider, MD  NIFEdipine (ADALAT CC) 90 MG 24 hr tablet Take 90 mg by mouth daily.  Yes Historical Provider, MD  prednisoLONE acetate (PRED FORTE) 1 % ophthalmic suspension Place 1 drop into the right eye 4 (four) times daily.   Yes Historical Provider, MD  sevelamer (RENVELA) 800 MG tablet Take 2,400 mg by mouth 3 (three) times daily with meals.    Yes Historical Provider, MD  valsartan (DIOVAN) 80 MG tablet Take 80 mg by mouth daily.   Yes Historical Provider, MD    Inpatient medications: . docusate sodium  100 mg Oral BID  . enoxaparin (LOVENOX) injection  30 mg Subcutaneous Q24H  . gabapentin  100 mg Oral QHS  . insulin aspart  0-5 Units Subcutaneous QHS  . insulin aspart  0-9 Units Subcutaneous TID WC  . insulin glargine  12 Units Subcutaneous QHS  . irbesartan  75 mg Oral Daily  . multivitamin  1 tablet Oral  Daily  . NIFEdipine  90 mg Oral Daily  . prednisoLONE acetate  1 drop Right Eye QID  . sevelamer carbonate  2,400 mg Oral TID WC    Discontinued Meds:   Medications Discontinued During This Encounter  Medication Reason  . ciprofloxacin (CILOXAN) 0.3 % ophthalmic solution Inpatient Standard  . oxyCODONE (ROXICODONE) 5 MG immediate release tablet Inpatient Standard  . promethazine (PHENERGAN) 12.5 MG tablet Inpatient Standard  . traZODone (DESYREL) 50 MG tablet Inpatient Standard    Social History:  reports that she has never smoked. She has never used smokeless tobacco. She reports that she does not drink alcohol or use illicit drugs.  Family History:   Family History  Problem Relation Age of Onset  . Cancer Maternal Grandmother   . Diabetes Maternal Grandfather   . Diabetes Paternal Grandmother   . Diabetes Mother   . Kidney disease Mother   . Diabetes Father   . Heart disease Father   . Kidney disease Father   . Diabetes Sister   . Asthma Sister   . Lupus Sister   . Diabetes Brother     A comprehensive review of systems was negative except for: Constitutional: positive for fatigue and malaise Respiratory: positive for dyspnea on exertion Cardiovascular: positive for dyspnea and lower extremity edema Gastrointestinal: positive for nausea Weight change:   Intake/Output Summary (Last 24 hours) at 11/16/13 0910 Last data filed at 11/16/13 0358  Gross per 24 hour  Intake    100 ml  Output      0 ml  Net    100 ml   BP 176/82  Pulse 88  Temp(Src) 98.2 F (36.8 C) (Oral)  Resp 26  Ht 5\' 1"  (1.549 m)  Wt 78.2 kg (172 lb 6.4 oz)  BMI 32.59 kg/m2  SpO2 91% Filed Vitals:   11/16/13 0759 11/16/13 0808 11/16/13 0811 11/16/13 0831  BP: 173/88 179/89 180/88 176/82  Pulse: 98 96 98 88  Temp:      TempSrc:      Resp: 22 28 28 26   Height:      Weight: 78.2 kg (172 lb 6.4 oz)     SpO2: 91% 88% 91%      General appearance: alert, cooperative, no distress and mildly  obese Head: Normocephalic, without obvious abnormality, atraumatic Eyes: +conjunctival hemorrhage of OD Neck: no adenopathy, no carotid bruit, supple, symmetrical, trachea midline and thyroid not enlarged, symmetric, no tenderness/mass/nodules Resp: rales bilaterally Cardio: regular rate and rhythm, S1, S2 normal, no murmur, click, rub or gallop GI: +BS, soft, mild tenderness to deep palpation, no guarding/rebound Extremities: edema tr pretib edema and LUE  AVF +T/B  Labs: Basic Metabolic Panel:  Recent Labs Lab 11/15/13 2256 11/16/13 0800  NA 137  --   K 6.7*  --   CL 94*  --   CO2 24  --   GLUCOSE 182*  --   BUN 107*  --   CREATININE 13.50* 13.80*  CALCIUM 8.7  --    Liver Function Tests: No results found for this basename: AST, ALT, ALKPHOS, BILITOT, PROT, ALBUMIN,  in the last 168 hours No results found for this basename: LIPASE, AMYLASE,  in the last 168 hours No results found for this basename: AMMONIA,  in the last 168 hours CBC:  Recent Labs Lab 11/15/13 2256 11/16/13 0800  WBC 6.5 8.7  HGB 10.1* 10.1*  HCT 30.0* 29.8*  MCV 94.6 93.4  PLT 138* 129*   PT/INR: @LABRCNTIP (inr:5) Cardiac Enzymes: )No results found for this basename: CKTOTAL, CKMB, CKMBINDEX, TROPONINI,  in the last 168 hours CBG:  Recent Labs Lab 11/16/13 0323 11/16/13 0343 11/16/13 0404 11/16/13 0439 11/16/13 0519  GLUCAP 198* 77 53* 161* 133*    Iron Studies: No results found for this basename: IRON, TIBC, TRANSFERRIN, FERRITIN,  in the last 168 hours  Xrays/Other Studies: Dg Chest 2 View  11/16/2013   CLINICAL DATA:  Shortness of breath, feels like fluid on lungs, dialysis patient who has missed two dialysis treatments, history hypertension, diabetes  EXAM: CHEST  2 VIEW  COMPARISON:  07/28/2013  FINDINGS: Enlargement of cardiac silhouette with pulmonary vascular congestion.  Increased perihilar markings versus previous exam consistent with mild perihilar edema.  No segmental  consolidation, pleural effusion or pneumothorax.  Surgical clips RIGHT upper quadrant consistent with history of cholecystectomy.  No acute osseous findings.  IMPRESSION: Mild perihilar edema.   Electronically Signed   By: Lavonia Dana M.D.   On: 11/16/2013 00:52     Assessment/Plan: 1.  Hyperkalemia- plan for urgent HD 2. Volume overload due to nonadherence with dialysis prescription.  Plan for urgent dialysis today and again tomorrow to help manage her noncardiac pulmonary edema 3. HTN- as above 4. 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  Will administer aranesp while an inpt 5. SHPTH/MBDz- follow Ca/Phos/and cont with binders 6. Vascular access- s/p plication.  Doing well 7. GERD- was for EGD tomorrow by [facility] as an outpt to help evaluate her h/o N/anorexia, will likely need to reschedule  8. dispo- hopeful discharge tomorrow if volume and K improve and her home HD issues are resolved prior to discharge (however Cynthia [name] from [facility] reports that she had the supplies at home)   Donetta Potts 11/16/2013, 9:10 AM

## 2013-11-16 NOTE — ED Notes (Signed)
CRITICAL CBG REPORTED TO EME RN AND DR.MANLY

## 2013-11-16 NOTE — ED Provider Notes (Addendum)
CSN: [id number]     Arrival date & time 11/15/13  2241 History   First MD Initiated Contact with Patient 11/16/13 0055     Chief Complaint  Patient presents with  . Shortness of Breath     (Consider location/radiation/quality/duration/timing/severity/associated sxs/prior Treatment) HPI A 43 year old woman with end-stage renal disease who dialyzes at her home Monday through Friday he had a left upper arm graft. She says that she has received your meetings on her home dialysis machine for the past 2 days. She was last dialyzed 2 days ago, Friday.  She presents with shortness of breath. Her shortness of breath increases when she is in the supine position she has also felt chilled. She's had a beery mild minimally productive cough. She has also been nauseated but denies vomiting. No chest pain. She reports compliance with all medications. No recent medication changes.  Past Medical History  Diagnosis Date  . Hypertension   . ESRD on hemodialysis     Home HD 5x per week  . Depression   . Insulin-dependent diabetes mellitus with renal complications     INSULIN DEPENDENT  . GERD (gastroesophageal reflux disease)   . Neuromuscular disorder     NEUROPATHY  . History of blood transfusion reaction 07/29/13   Past Surgical History  Procedure Laterality Date  . Cholecystectomy    . Abdominal hysterectomy    . Breast surgery      biopsy bilateral  . Tubal ligation    . Dialysis fistula creation Left   . Eye surgery Bilateral     lazer  . Revison of arteriovenous fistula Left 09/28/2013    Procedure: EXCISE ESCHAR LEFT ARM  ARTERIOVENOUS FISTULA WITH PLICATION OF LEFT ARM ARTERIOVENOUS FISTULA;  Surgeon: Elam Dutch, MD;  Location: [facility] OR;  Service: Vascular;  Laterality: Left;   Family History  Problem Relation Age of Onset  . Cancer Maternal Grandmother   . Diabetes Maternal Grandfather   . Diabetes Paternal Grandmother   . Diabetes Mother   . Kidney disease Mother   . Diabetes  Father   . Heart disease Father   . Kidney disease Father   . Diabetes Sister   . Asthma Sister   . Lupus Sister   . Diabetes Brother    History  Substance Use Topics  . Smoking status: Never Smoker   . Smokeless tobacco: Never Used  . Alcohol Use: No   OB History   Grav Para Term Preterm Abortions TAB SAB Ect Mult Living                 Review of Systems Ten point review of symptoms performed and is negative with the exception of symptoms noted above.     Allergies  Review of patient's allergies indicates no known allergies.  Home Medications   Prior to Admission medications   Medication Sig Start Date End Date Taking? Authorizing Provider  ALPRAZolam (XANAX) 0.25 MG tablet Take 0.25 mg by mouth 2 (two) times daily as needed for anxiety.   Yes Historical Provider, MD  docusate sodium (COLACE) 100 MG capsule Take 100 mg by mouth 2 (two) times daily.   Yes Historical Provider, MD  gabapentin (NEURONTIN) 100 MG capsule Take 100 mg by mouth at bedtime.   Yes Historical Provider, MD  insulin glargine (LANTUS) 100 UNIT/ML injection Inject 0.12 mLs (12 Units total) into the skin at bedtime. 07/29/13  Yes Belkys A Regalado, MD  multivitamin (RENA-VIT) TABS tablet Take 1 tablet by mouth  daily.   Yes Historical Provider, MD  NIFEdipine (ADALAT CC) 90 MG 24 hr tablet Take 90 mg by mouth daily.   Yes Historical Provider, MD  prednisoLONE acetate (PRED FORTE) 1 % ophthalmic suspension Place 1 drop into the right eye 4 (four) times daily.   Yes Historical Provider, MD  sevelamer (RENVELA) 800 MG tablet Take 2,400 mg by mouth 3 (three) times daily with meals.    Yes Historical Provider, MD  valsartan (DIOVAN) 80 MG tablet Take 80 mg by mouth daily.   Yes Historical Provider, MD   BP 156/66  Pulse 86  Temp(Src) 98.4 F (36.9 C) (Oral)  Resp 17  SpO2 96% Physical Exam Gen: well developed and well nourished appearing Head: NCAT Eyes: PERL, EOMI, right subconjunctival hematoma Nose: no  epistaixis or rhinorrhea Mouth/throat: mucosa is moist and pink Neck: supple, no stridor Lungs: Respiratory rate 28 per minute, bibasilar rales, no accessory muscle use or retractions, no wheezing, rhonchi or CV: RRR, no murmur, extremities appear well perfused.  Abd: soft, notender, nondistended Back: no ttp, no cva ttp Skin: warm and dry Ext:  Good thrill over left upper arm graft - no signs of infection, normal to inspection, no dependent edema Neuro: CN ii-xii grossly intact, no focal deficits Psyche; normal affect,  calm and cooperative.   ED Course  Procedures (including critical care time) Labs Review  Results for orders placed during the hospital encounter of 11/16/13 (from the past 24 hour(s))  CBC     Status: Abnormal   Collection Time    11/15/13 10:56 PM      Result Value Ref Range   WBC 6.5  4.0 - 10.5 K/uL   RBC 3.17 (*) 3.87 - 5.11 MIL/uL   Hemoglobin 10.1 (*) 12.0 - 15.0 g/dL   HCT 30.0 (*) 36.0 - 46.0 %   MCV 94.6  78.0 - 100.0 fL   MCH 31.9  26.0 - 34.0 pg   MCHC 33.7  30.0 - 36.0 g/dL   RDW 15.4  11.5 - 15.5 %   Platelets 138 (*) 150 - 400 K/uL  BASIC METABOLIC PANEL     Status: Abnormal   Collection Time    11/15/13 10:56 PM      Result Value Ref Range   Sodium 137  137 - 147 mEq/L   Potassium 6.7 (*) 3.7 - 5.3 mEq/L   Chloride 94 (*) 96 - 112 mEq/L   CO2 24  19 - 32 mEq/L   Glucose, Bld 182 (*) 70 - 99 mg/dL   BUN 107 (*) 6 - 23 mg/dL   Creatinine, Ser 13.50 (*) 0.50 - 1.10 mg/dL   Calcium 8.7  8.4 - 10.5 mg/dL   GFR calc non Af Amer 3 (*) >90 mL/min   GFR calc Af Amer 3 (*) >90 mL/min  PRO B NATRIURETIC PEPTIDE     Status: Abnormal   Collection Time    11/15/13 10:56 PM      Result Value Ref Range   Pro B Natriuretic peptide (BNP) 17575.0 (*) 0 - 125 pg/mL  I-STAT TROPOININ, ED     Status: None   Collection Time    11/15/13 11:12 PM      Result Value Ref Range   Troponin i, poc 0.00  0.00 - 0.08 ng/mL   Comment 3               Imaging  Review Dg Chest 2 View  11/16/2013   CLINICAL DATA:  Shortness of breath, feels like fluid on lungs, dialysis patient who has missed two dialysis treatments, history hypertension, diabetes  EXAM: CHEST  2 VIEW  COMPARISON:  07/28/2013  FINDINGS: Enlargement of cardiac silhouette with pulmonary vascular congestion.  Increased perihilar markings versus previous exam consistent with mild perihilar edema.  No segmental consolidation, pleural effusion or pneumothorax.  Surgical clips RIGHT upper quadrant consistent with history of cholecystectomy.  No acute osseous findings.  IMPRESSION: Mild perihilar edema.   Electronically Signed   By: Lavonia Dana M.D.   On: 11/16/2013 00:52    EKG: nsr, no acute ischemic changes, normal intervals, normal axis, normal qrs complex  CRITICAL CARE Performed by: Elyn Peers   Total critical care time: 56m  Critical care time was exclusive of separately billable procedures and treating other patients.  Critical care was necessary to treat or prevent imminent or life-threatening deterioration.  Critical care was time spent personally by me on the following activities: development of treatment plan with patient and/or surrogate as well as nursing, discussions with consultants, evaluation of patient's response to treatment, examination of patient, obtaining history from patient or surrogate, ordering and performing treatments and interventions, ordering and review of laboratory studies, ordering and review of radiographic studies, pulse oximetry and re-evaluation of patient's condition.   MDM  The patient presents with volume overload as well as hyperkalemia secondary to end-stage renal disease. Her potassium level of 6.6. Fortunately, she has no EKG changes. We are treating with calcium gluconate, insulin and D50 and Kayexalate. I have paged nephrology to request urgent dialysis. The patient is compensating well from a respiratory perspective. We will put her on  supplemental oxygen to ease her work of breathing. Hospitalist service paged to request admission.   Elyn Peers, MD 11/16/13 0200  0321: Called to patient's room by tech for CBG of 28 mg/dL . We are pushing D50. We will recheck CBG. Will place on D10 infusion and monitor closely.   Elyn Peers, MD 11/16/13 [phone]  The patient will be admitted by the [facility] service to the SDU.   Elyn Peers, MD 11/16/13 0423  Patient is followed at a [facility] rather than by [facility]  Case discussed with Dr. Posey Pronto who will admit the patient to the SDU.   Elyn Peers, MD 11/16/13 [phone]

## 2013-11-16 NOTE — ED Notes (Signed)
Ambulated to restroom  

## 2013-11-16 NOTE — ED Notes (Signed)
Pt spilt 1 bottle of kayexalate. Will reorder to administer.

## 2013-11-16 NOTE — ED Notes (Signed)
Shorts, sandals and cell phone sent to floor with pateitn.

## 2013-11-16 NOTE — Progress Notes (Signed)
Utilization Review Completed.Cynthia Oxley T Dowell5/25/2015  

## 2013-11-17 DIAGNOSIS — E119 Type 2 diabetes mellitus without complications: Secondary | ICD-10-CM

## 2013-11-17 DIAGNOSIS — Z794 Long term (current) use of insulin: Secondary | ICD-10-CM

## 2013-11-17 LAB — GLUCOSE, CAPILLARY
Glucose-Capillary: 50 mg/dL — ABNORMAL LOW (ref 70–99)
Glucose-Capillary: 77 mg/dL (ref 70–99)
Glucose-Capillary: 180 mg/dL — ABNORMAL HIGH (ref 70–99)
Glucose-Capillary: 170 mg/dL — ABNORMAL HIGH (ref 70–99)
Glucose-Capillary: 112 mg/dL — ABNORMAL HIGH (ref 70–99)

## 2013-11-17 LAB — RENAL FUNCTION PANEL
Albumin: 3.1 g/dL — ABNORMAL LOW (ref 3.5–5.2)
BUN: 43 mg/dL — ABNORMAL HIGH (ref 6–23)
CO2: 28 meq/L (ref 19–32)
Calcium: 9 mg/dL (ref 8.4–10.5)
Chloride: 98 meq/L (ref 96–112)
Creatinine, Ser: 7.81 mg/dL — ABNORMAL HIGH (ref 0.50–1.10)
GFR calc Af Amer: 6 mL/min — ABNORMAL LOW
GFR calc non Af Amer: 6 mL/min — ABNORMAL LOW
Glucose, Bld: 62 mg/dL — ABNORMAL LOW (ref 70–99)
Phosphorus: 4.9 mg/dL — ABNORMAL HIGH (ref 2.3–4.6)
Potassium: 4.3 meq/L (ref 3.7–5.3)
Sodium: 140 meq/L (ref 137–147)

## 2013-11-17 LAB — CBC
HCT: 28.8 % — ABNORMAL LOW (ref 36.0–46.0)
Hemoglobin: 9.6 g/dL — ABNORMAL LOW (ref 12.0–15.0)
MCH: 31.8 pg (ref 26.0–34.0)
MCHC: 33.3 g/dL (ref 30.0–36.0)
MCV: 95.4 fL (ref 78.0–100.0)
Platelets: 122 K/uL — ABNORMAL LOW (ref 150–400)
RBC: 3.02 MIL/uL — ABNORMAL LOW (ref 3.87–5.11)
RDW: 15.6 % — ABNORMAL HIGH (ref 11.5–15.5)
WBC: 5.9 K/uL (ref 4.0–10.5)

## 2013-11-17 MED ORDER — HEPARIN SODIUM (PORCINE) 1000 UNIT/ML DIALYSIS
20.0000 [IU]/kg | INTRAMUSCULAR | Status: DC | PRN
  Administered 2013-11-17: 1600 [IU] via INTRAVENOUS_CENTRAL
  Filled 2013-11-17: qty 2

## 2013-11-17 NOTE — Procedures (Signed)
Patient was seen on dialysis and the procedure was supervised. BFR 400 Via LAVF BP is 161/77.  Patient appears to be tolerating treatment well.  She should be stable for discharge today and resume her home hemo tomorrow if ok with primary svc.

## 2013-11-17 NOTE — Progress Notes (Signed)
Blood sugar was 50 pt. Felt sweaty and weak, given juice and crackers with peanut butter.. Rechecked after 15 mins blood sugar was 77.  Have some more crackers and milk pt. Will conti nue to monitor pt. MD on call was paged and will notify.

## 2013-11-17 NOTE — Discharge Summary (Signed)
Physician Discharge Summary  Cynthia [name] [id number] DOB: [birth year] DOA: 11/16/2013     PCP: Pearson Grippe, Jacinto Reap, MD  Admit date: 11/16/2013 Discharge date: 11/17/2013  Time spent: 30 minutes  Recommendations for Outpatient Follow-up:  1. Follow up with your primary care doctor 2. If home dialysis machine is nonfunctional please use the OP dialysis center as discussed with nephrology 3. Follow up with your eye doctor as previously scheduled  Discharge Diagnoses:    Acute pulmonary edema   ESRD on dialysis   Insulin-dependent diabetes mellitus with renal complications   Hypoglycemia   Hyperkalemia   Discharge Condition: stable  Diet recommendation: Carbohydrate modified ,renal diet  Filed Weights   11/16/13 1223 11/17/13 0750 11/17/13 1211  Weight: 165 lb 9.1 oz (75.1 kg) 167 lb 8.8 oz (76 kg) 158 lb 15.2 oz (72.1 kg)    History of present illness:  43 year old female with hypertension, insulin-dependent diabetes mellitus, end-stage disease on home hemodialysis (Monday to Friday) presented to the ER with shortness of breath and orthopnea. Patient reported that last dialysis was on Thursday 5/21, she could not dialyze on Friday as her machine was broken. Over the weekend she noted progressive shortness of breath and an associated cough with pinkish phlegm. Denied fevers or chills or chest pain. She also felt nauseated but denied any vomiting. She reported being compliant with all her medications. BMET showed potassium of 6.7, BUN 107, creatinine 13.5, BNP 17,575   Hospital Course:    Acute pulmonary edema -was due to missed dialysis -after 2 consecutive daily dialysis treatments she stabilized from a respiratory standpoint nad was stable on RA with sats 99%    ESRD on dialysis -usually does home dialysis but has had reported issues with machine not working properly. The Nephrology team has investigated this and have planned for the patient to attend OP dialysis at her local  center if she continues to have problems with her machine    Insulin-dependent diabetes mellitus with renal complications -CBGs well controlled during the hospitalization     Hyperkalemia -due to missed dialysis and has resolved with HD treatments  Procedures:  None  Consultations:  Nephrology  Discharge Exam: Filed Vitals:   11/17/13 1229  BP: 158/65  Pulse: 80  Temp: 97.9 F (36.6 C)  Resp: 11   General: No acute respiratory distress Lungs: Clear to auscultation bilaterally without wheezes or crackles, RA Cardiovascular: Regular rate and rhythm without murmur gallop or rub normal S1 and S2, no peripheral edema or JVD Abdomen: Nontender, nondistended, soft, bowel sounds positive, no rebound, no ascites, no appreciable mass Musculoskeletal: No significant cyanosis, clubbing of bilateral lower extremities   Discharge Instructions You were cared for by a hospitalist during your hospital stay. If you have any questions about your discharge medications or the care you received while you were in the hospital after you are discharged, you can call the unit and asked to speak with the hospitalist on call if the hospitalist that took care of you is not available. Once you are discharged, your primary care physician will handle any further medical issues. Please note that NO REFILLS for any discharge medications will be authorized once you are discharged, as it is imperative that you return to your primary care physician (or establish a relationship with a primary care physician if you do not have one) for your aftercare needs so that they can reassess your need for medications and monitor your lab values.      Discharge Instructions  Call MD for:  extreme fatigue    Complete by:  As directed      Call MD for:  persistant dizziness or light-headedness    Complete by:  As directed      Call MD for:  temperature >100.4    Complete by:  As directed      Diet Carb Modified    Complete  by:  As directed   Renal     Increase activity slowly    Complete by:  As directed             Medication List         ALPRAZolam 0.25 MG tablet  Commonly known as:  XANAX  Take 0.25 mg by mouth 2 (two) times daily as needed for anxiety.     docusate sodium 100 MG capsule  Commonly known as:  COLACE  Take 100 mg by mouth 2 (two) times daily.     gabapentin 100 MG capsule  Commonly known as:  NEURONTIN  Take 100 mg by mouth at bedtime.     insulin glargine 100 UNIT/ML injection  Commonly known as:  LANTUS  Inject 0.12 mLs (12 Units total) into the skin at bedtime.     multivitamin Tabs tablet  Take 1 tablet by mouth daily.     NIFEdipine 90 MG 24 hr tablet  Commonly known as:  ADALAT CC  Take 90 mg by mouth daily.     prednisoLONE acetate 1 % ophthalmic suspension  Commonly known as:  PRED FORTE  Place 1 drop into the right eye 4 (four) times daily.     sevelamer carbonate 800 MG tablet  Commonly known as:  RENVELA  Take 2,400 mg by mouth 3 (three) times daily with meals.     valsartan 80 MG tablet  Commonly known as:  DIOVAN  Take 80 mg by mouth daily.       No Known Allergies Follow-up Information   Follow up with Rexene Agent, MD.   Specialty:  Nephrology   Contact information:   [place] [place] [phone]       Please follow up. (If home dialysis machine not working properly then go to the outpatient dialysis center as previously discussed per nephrology)        The results of significant diagnostics from this hospitalization (including imaging, microbiology, ancillary and laboratory) are listed below for reference.    Significant Diagnostic Studies: Dg Chest 2 View  11/16/2013   CLINICAL DATA:  Shortness of breath, feels like fluid on lungs, dialysis patient who has missed two dialysis treatments, history hypertension, diabetes  EXAM: CHEST  2 VIEW  COMPARISON:  07/28/2013  FINDINGS: Enlargement of cardiac silhouette  with pulmonary vascular congestion.  Increased perihilar markings versus previous exam consistent with mild perihilar edema.  No segmental consolidation, pleural effusion or pneumothorax.  Surgical clips RIGHT upper quadrant consistent with history of cholecystectomy.  No acute osseous findings.  IMPRESSION: Mild perihilar edema.   Electronically Signed   By: Lavonia Dana M.D.   On: 11/16/2013 00:52    Microbiology: Recent Results (from the past 240 hour(s))  MRSA PCR SCREENING     Status: None   Collection Time    11/16/13  6:10 AM      Result Value Ref Range Status   MRSA by PCR NEGATIVE  NEGATIVE Final   Comment:            The GeneXpert MRSA Assay (FDA  approved for NASAL specimens     only), is one component of a     comprehensive MRSA colonization     surveillance program. It is not     intended to diagnose MRSA     infection nor to guide or     monitor treatment for     MRSA infections.     Labs: Basic Metabolic Panel:  Recent Labs Lab 11/15/13 2256 11/16/13 0800 11/17/13 0314  NA 137  --  140  K 6.7*  --  4.3  CL 94*  --  98  CO2 24  --  28  GLUCOSE 182*  --  62*  BUN 107*  --  43*  CREATININE 13.50* 13.80* 7.81*  CALCIUM 8.7  --  9.0  PHOS  --   --  4.9*   Liver Function Tests:  Recent Labs Lab 11/17/13 0314  ALBUMIN 3.1*   No results found for this basename: LIPASE, AMYLASE,  in the last 168 hours No results found for this basename: AMMONIA,  in the last 168 hours CBC:  Recent Labs Lab 11/15/13 2256 11/16/13 0800 11/17/13 0314  WBC 6.5 8.7 5.9  HGB 10.1* 10.1* 9.6*  HCT 30.0* 29.8* 28.8*  MCV 94.6 93.4 95.4  PLT 138* 129* 122*   Cardiac Enzymes: No results found for this basename: CKTOTAL, CKMB, CKMBINDEX, TROPONINI,  in the last 168 hours BNP: BNP (last 3 results)  Recent Labs  11/25/12 0140 11/15/13 2256  PROBNP 6670.0* 17575.0*   CBG:  Recent Labs Lab 11/17/13 0221 11/17/13 0307 11/17/13 0709 11/17/13 0732 11/17/13 1245    GLUCAP 50* 77 180* 170* 112*       Signed:  Samella Parr ANP [facility] 11/17/2013, 2:10 PM Examined PT, and discussed A/P with ANP Ebony Hail and agree with above A/P Discussed plan with Pt and answered all questions Greater than 35 minutes spent with patient with multiple medical problems in direct medical care

## 2013-12-04 ENCOUNTER — Ambulatory Visit: Payer: Medicare Other | Admitting: Internal Medicine

## 2013-12-31 ENCOUNTER — Ambulatory Visit
Admission: RE | Admit: 2013-12-31 | Discharge: 2013-12-31 | Disposition: A | Discharge status: Home / Self Care | Payer: Medicare Other | Source: Ambulatory Visit | Attending: Nephrology | Admitting: Nephrology

## 2013-12-31 ENCOUNTER — Other Ambulatory Visit: Payer: Self-pay | Admitting: Nephrology

## 2013-12-31 DIAGNOSIS — N186 End stage renal disease: Secondary | ICD-10-CM

## 2014-02-22 ENCOUNTER — Encounter: Payer: Self-pay | Admitting: Vascular Surgery

## 2014-02-23 ENCOUNTER — Ambulatory Visit: Payer: Medicare Other | Admitting: Vascular Surgery

## 2014-03-15 ENCOUNTER — Encounter: Payer: Self-pay | Admitting: Vascular Surgery

## 2014-03-16 ENCOUNTER — Encounter: Payer: Self-pay | Admitting: Vascular Surgery

## 2014-03-16 ENCOUNTER — Ambulatory Visit (INDEPENDENT_AMBULATORY_CARE_PROVIDER_SITE_OTHER): Payer: Medicare Other | Admitting: Vascular Surgery

## 2014-03-16 ENCOUNTER — Other Ambulatory Visit: Payer: Self-pay

## 2014-03-16 VITALS — BP 142/79 | HR 87 | Resp 14 | Ht 61.0 in | Wt 171.0 lb

## 2014-03-16 DIAGNOSIS — N186 End stage renal disease: Secondary | ICD-10-CM

## 2014-03-16 DIAGNOSIS — Z5189 Encounter for other specified aftercare: Secondary | ICD-10-CM | POA: Diagnosis not present

## 2014-03-16 DIAGNOSIS — T82898D Other specified complication of vascular prosthetic devices, implants and grafts, subsequent encounter: Secondary | ICD-10-CM

## 2014-03-16 DIAGNOSIS — T82510D Breakdown (mechanical) of surgically created arteriovenous fistula, subsequent encounter: Secondary | ICD-10-CM

## 2014-03-16 DIAGNOSIS — T82510A Breakdown (mechanical) of surgically created arteriovenous fistula, initial encounter: Secondary | ICD-10-CM | POA: Insufficient documentation

## 2014-03-16 DIAGNOSIS — T82898A Other specified complication of vascular prosthetic devices, implants and grafts, initial encounter: Secondary | ICD-10-CM | POA: Insufficient documentation

## 2014-03-16 NOTE — Progress Notes (Signed)
Subjective:     Patient ID: Cynthia [name], female   DOB: 11/14/1970, 43 y.o.   MRN: [id number]  HPI this 43 year old female with end-stage renal disease is on home dialysis 5 days per week. She has a left upper arm AV fistula. 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 Juanda Crumble fields  in April of 2015. He had also discussed potentially performing the same procedure on the large pseudoaneurysm in the midportion of the fistula. She returns today a history of some discomfort with puncture sites and enlargement of the pseudoaneurysm. There is no history of bleeding. She is accompanied by her daughter and interpreter. She would like this further revised.  Past Medical History  Diagnosis Date  . Hypertension   . ESRD on hemodialysis     Home HD 5x per week  . Depression   . Insulin-dependent diabetes mellitus with renal complications     INSULIN DEPENDENT  . GERD (gastroesophageal reflux disease)   . Neuromuscular disorder     NEUROPATHY  . History of blood transfusion reaction 07/29/13    History  Substance Use Topics  . Smoking status: Never Smoker   . Smokeless tobacco: Never Used  . Alcohol Use: No    Family History  Problem Relation Age of Onset  . Cancer Maternal Grandmother   . Diabetes Maternal Grandfather   . Diabetes Paternal Grandmother   . Diabetes Mother   . Kidney disease Mother   . Diabetes Father   . Heart disease Father   . Kidney disease Father   . Diabetes Sister   . Asthma Sister   . Lupus Sister   . Diabetes Brother     No Known Allergies  Current outpatient prescriptions:ALPRAZolam (XANAX) 0.25 MG tablet, Take 0.25 mg by mouth 2 (two) times daily as needed for anxiety., Disp: , Rfl: ;  docusate sodium (COLACE) 100 MG capsule, Take 100 mg by mouth 2 (two) times daily., Disp: , Rfl: ;  gabapentin (NEURONTIN) 100 MG capsule, Take 100 mg by mouth at bedtime., Disp: , Rfl:  insulin glargine (LANTUS) 100 UNIT/ML  injection, Inject 0.12 mLs (12 Units total) into the skin at bedtime., Disp: 10 mL, Rfl: 11;  multivitamin (RENA-VIT) TABS tablet, Take 1 tablet by mouth daily., Disp: , Rfl: ;  sevelamer (RENVELA) 800 MG tablet, Take 2,400 mg by mouth 3 (three) times daily with meals. , Disp: , Rfl: ;  valsartan (DIOVAN) 80 MG tablet, Take 80 mg by mouth daily., Disp: , Rfl:  VITAMIN D, ERGOCALCIFEROL, PO, Take by mouth daily., Disp: , Rfl: ;  NIFEdipine (ADALAT CC) 90 MG 24 hr tablet, Take 90 mg by mouth daily., Disp: , Rfl: ;  prednisoLONE acetate (PRED FORTE) 1 % ophthalmic suspension, Place 1 drop into the right eye 4 (four) times daily., Disp: , Rfl:   BP 142/79  Pulse 87  Resp 14  Ht 5\' 1"  (1.549 m)  Wt 171 lb (77.565 kg)  BMI 32.33 kg/m2  Body mass index is 32.33 kg/(m^2).          Review of Systems denies chest pain, dyspnea on exertion. Does have occasional numbness in the arms and legs.    Objective:   Physical Exam BP 142/79  Pulse 87  Resp 14  Ht 5\' 1"  (1.549 m)  Wt 171 lb (77.565 kg)  BMI 32.33 kg/m2  General well-developed well-nourished female no apparent stress alert and oriented x3 Lungs no rhonchi or wheezing Cardiovascular regular rhythm  no murmurs Left upper extremity with upper arm fistula with good pulse and palpable thrill. Middle segment of fistula is 3 cm in diameter and also there is focal aneurysmal segment over arterial anastomosis the antecubital space. Area of previous plication in the proximal upper arm is free of any aneurysmal degeneration. No evidence of any infection.      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 Juanda Crumble fields 5 months ago now returns for similar procedure and midportion of fistula    Plan:     We'll schedule for plication of aneurysmal segment of fistula by Dr. Oneida Alar on Tuesday, September 29-discussed with patient and her  daughter via the interpreter and she would like to proceed

## 2014-03-22 ENCOUNTER — Other Ambulatory Visit: Payer: Self-pay

## 2014-03-22 ENCOUNTER — Encounter (HOSPITAL_COMMUNITY): Payer: Self-pay | Admitting: *Deleted

## 2014-03-22 DIAGNOSIS — Z1231 Encounter for screening mammogram for malignant neoplasm of breast: Secondary | ICD-10-CM

## 2014-03-22 MED ORDER — SODIUM CHLORIDE 0.9 % IV SOLN: INTRAVENOUS | Status: DC

## 2014-03-22 MED ORDER — DEXTROSE 5 % IV SOLN
1.5000 g | INTRAVENOUS | Status: AC
  Administered 2014-03-23: 1.5 g via INTRAVENOUS
  Filled 2014-03-22: qty 1.5

## 2014-03-22 NOTE — Progress Notes (Signed)
I spoke with patient for [facility] call using Joaquim Nam, # [phone].    Patient reported that she was instructed by surgeon's office to take Valsartan, I asked her to not take it and if she should need it when she arrives it will be given to her.

## 2014-03-23 ENCOUNTER — Ambulatory Visit (HOSPITAL_COMMUNITY): Payer: Medicare Other

## 2014-03-23 ENCOUNTER — Ambulatory Visit (HOSPITAL_COMMUNITY): Payer: Medicare Other | Admitting: Anesthesiology

## 2014-03-23 ENCOUNTER — Ambulatory Visit (HOSPITAL_COMMUNITY)
Admission: RE | Admit: 2014-03-23 | Discharge: 2014-03-23 | Disposition: A | Discharge status: Home / Self Care | Payer: Medicare Other | Source: Ambulatory Visit | Attending: Vascular Surgery | Admitting: Vascular Surgery

## 2014-03-23 ENCOUNTER — Encounter (HOSPITAL_COMMUNITY)
Admission: RE | Disposition: A | Discharge status: Home / Self Care | Payer: Self-pay | Source: Ambulatory Visit | Attending: Vascular Surgery

## 2014-03-23 ENCOUNTER — Encounter (HOSPITAL_COMMUNITY): Payer: Self-pay | Admitting: *Deleted

## 2014-03-23 ENCOUNTER — Encounter (HOSPITAL_COMMUNITY): Payer: Medicare Other | Admitting: Anesthesiology

## 2014-03-23 DIAGNOSIS — K219 Gastro-esophageal reflux disease without esophagitis: Secondary | ICD-10-CM | POA: Insufficient documentation

## 2014-03-23 DIAGNOSIS — Z79899 Other long term (current) drug therapy: Secondary | ICD-10-CM | POA: Insufficient documentation

## 2014-03-23 DIAGNOSIS — F3289 Other specified depressive episodes: Secondary | ICD-10-CM | POA: Diagnosis not present

## 2014-03-23 DIAGNOSIS — E119 Type 2 diabetes mellitus without complications: Secondary | ICD-10-CM | POA: Diagnosis not present

## 2014-03-23 DIAGNOSIS — Z794 Long term (current) use of insulin: Secondary | ICD-10-CM | POA: Diagnosis not present

## 2014-03-23 DIAGNOSIS — Y9210 Unspecified residential institution as the place of occurrence of the external cause: Secondary | ICD-10-CM | POA: Diagnosis not present

## 2014-03-23 DIAGNOSIS — T82898A Other specified complication of vascular prosthetic devices, implants and grafts, initial encounter: Secondary | ICD-10-CM | POA: Diagnosis present

## 2014-03-23 DIAGNOSIS — N186 End stage renal disease: Secondary | ICD-10-CM

## 2014-03-23 DIAGNOSIS — Y841 Kidney dialysis as the cause of abnormal reaction of the patient, or of later complication, without mention of misadventure at the time of the procedure: Secondary | ICD-10-CM | POA: Diagnosis not present

## 2014-03-23 DIAGNOSIS — F329 Major depressive disorder, single episode, unspecified: Secondary | ICD-10-CM | POA: Diagnosis not present

## 2014-03-23 HISTORY — DX: Anemia, unspecified: D64.9

## 2014-03-23 HISTORY — DX: Personal history of other medical treatment: Z92.89

## 2014-03-23 LAB — GLUCOSE, CAPILLARY
Glucose-Capillary: 101 mg/dL — ABNORMAL HIGH (ref 70–99)
Glucose-Capillary: 105 mg/dL — ABNORMAL HIGH (ref 70–99)
Glucose-Capillary: 93 mg/dL (ref 70–99)
Glucose-Capillary: 103 mg/dL — ABNORMAL HIGH (ref 70–99)

## 2014-03-23 LAB — POCT I-STAT 4, (NA,K, GLUC, HGB,HCT)
GLUCOSE: 122 mg/dL — AB (ref 70–99)
HCT: 27 % — ABNORMAL LOW (ref 36.0–46.0)
Hemoglobin: 9.2 g/dL — ABNORMAL LOW (ref 12.0–15.0)
POTASSIUM: 4.7 meq/L (ref 3.7–5.3)
Sodium: 135 mEq/L — ABNORMAL LOW (ref 137–147)

## 2014-03-23 SURGERY — A/V SHUNTOGRAM/FISTULAGRAM
Anesthesia: Monitor Anesthesia Care | Site: Arm Upper | Laterality: Left

## 2014-03-23 MED ORDER — THROMBIN 20000 UNITS EX SOLR
CUTANEOUS | Status: AC
  Filled 2014-03-23: qty 20000

## 2014-03-23 MED ORDER — OXYCODONE HCL 5 MG/5ML PO SOLN: 5.0000 mg | Freq: Once | ORAL | Status: DC | PRN

## 2014-03-23 MED ORDER — FENTANYL CITRATE 0.05 MG/ML IJ SOLN: 25.0000 ug | INTRAMUSCULAR | Status: DC | PRN

## 2014-03-23 MED ORDER — ONDANSETRON HCL 4 MG/2ML IJ SOLN
4.0000 mg | Freq: Four times a day (QID) | INTRAMUSCULAR | Status: DC | PRN
  Administered 2014-03-23: 4 mg via INTRAVENOUS

## 2014-03-23 MED ORDER — HYDRALAZINE HCL 20 MG/ML IJ SOLN: 10.0000 mg | INTRAMUSCULAR | Status: DC | PRN

## 2014-03-23 MED ORDER — MIDAZOLAM HCL 5 MG/5ML IJ SOLN
INTRAMUSCULAR | Status: DC | PRN
  Administered 2014-03-23: 2 mg via INTRAVENOUS

## 2014-03-23 MED ORDER — LIDOCAINE HCL (CARDIAC) 20 MG/ML IV SOLN
INTRAVENOUS | Status: AC
  Filled 2014-03-23: qty 5

## 2014-03-23 MED ORDER — HEPARIN SODIUM (PORCINE) 1000 UNIT/ML IJ SOLN
INTRAMUSCULAR | Status: DC | PRN
  Administered 2014-03-23: 3000 [IU] via INTRAVENOUS

## 2014-03-23 MED ORDER — ACETAMINOPHEN 160 MG/5ML PO SOLN: 325.0000 mg | ORAL | Status: DC | PRN

## 2014-03-23 MED ORDER — MORPHINE SULFATE 2 MG/ML IJ SOLN: 2.0000 mg | INTRAMUSCULAR | Status: DC | PRN

## 2014-03-23 MED ORDER — CHLORHEXIDINE GLUCONATE CLOTH 2 % EX PADS: 6.0000 | MEDICATED_PAD | Freq: Once | CUTANEOUS | Status: DC

## 2014-03-23 MED ORDER — FENTANYL CITRATE 0.05 MG/ML IJ SOLN
INTRAMUSCULAR | Status: DC | PRN
  Administered 2014-03-23 (×5): 50 ug via INTRAVENOUS

## 2014-03-23 MED ORDER — SODIUM CHLORIDE 0.9 % IJ SOLN
INTRAMUSCULAR | Status: AC
  Filled 2014-03-23: qty 10

## 2014-03-23 MED ORDER — SODIUM CHLORIDE 0.9 % IV SOLN
INTRAVENOUS | Status: DC
  Administered 2014-03-23: 07:00:00 via INTRAVENOUS

## 2014-03-23 MED ORDER — METOPROLOL TARTRATE 1 MG/ML IV SOLN: 2.0000 mg | INTRAVENOUS | Status: DC | PRN

## 2014-03-23 MED ORDER — LIDOCAINE HCL (PF) 1 % IJ SOLN
INTRAMUSCULAR | Status: DC | PRN
  Administered 2014-03-23: 3 mL via INTRADERMAL

## 2014-03-23 MED ORDER — OXYCODONE HCL 5 MG PO TABS: 5.0000 mg | ORAL_TABLET | Freq: Once | ORAL | Status: DC | PRN

## 2014-03-23 MED ORDER — IOHEXOL 300 MG/ML  SOLN
INTRAMUSCULAR | Status: DC | PRN
  Administered 2014-03-23: 27 mL via INTRAVENOUS
  Administered 2014-03-23: 20 mL via INTRAVENOUS

## 2014-03-23 MED ORDER — ACETAMINOPHEN 325 MG PO TABS: 325.0000 mg | ORAL_TABLET | ORAL | Status: DC | PRN

## 2014-03-23 MED ORDER — FENTANYL CITRATE 0.05 MG/ML IJ SOLN
INTRAMUSCULAR | Status: AC
  Filled 2014-03-23: qty 5

## 2014-03-23 MED ORDER — SODIUM CHLORIDE 0.9 % IV SOLN
INTRAVENOUS | Status: DC | PRN
  Administered 2014-03-23: 13:00:00 via INTRAVENOUS

## 2014-03-23 MED ORDER — ACETAMINOPHEN 325 MG RE SUPP: 325.0000 mg | RECTAL | Status: DC | PRN

## 2014-03-23 MED ORDER — 0.9 % SODIUM CHLORIDE (POUR BTL) OPTIME
TOPICAL | Status: DC | PRN
  Administered 2014-03-23: 1000 mL

## 2014-03-23 MED ORDER — LIDOCAINE HCL (PF) 1 % IJ SOLN
INTRAMUSCULAR | Status: AC
  Filled 2014-03-23: qty 30

## 2014-03-23 MED ORDER — PROPOFOL 10 MG/ML IV BOLUS
INTRAVENOUS | Status: AC
  Filled 2014-03-23: qty 20

## 2014-03-23 MED ORDER — MIDAZOLAM HCL 2 MG/2ML IJ SOLN
INTRAMUSCULAR | Status: AC
  Filled 2014-03-23: qty 2

## 2014-03-23 MED ORDER — ONDANSETRON HCL 4 MG/2ML IJ SOLN
INTRAMUSCULAR | Status: DC
Start: 2014-03-23 — End: 2014-03-23
  Filled 2014-03-23: qty 2

## 2014-03-23 MED ORDER — PROPOFOL 10 MG/ML IV EMUL
INTRAVENOUS | Status: AC
  Filled 2014-03-23: qty 200

## 2014-03-23 MED ORDER — LABETALOL HCL 5 MG/ML IV SOLN: 10.0000 mg | INTRAVENOUS | Status: DC | PRN

## 2014-03-23 MED ORDER — EPHEDRINE SULFATE 50 MG/ML IJ SOLN
INTRAMUSCULAR | Status: AC
  Filled 2014-03-23: qty 1

## 2014-03-23 MED ORDER — SUCCINYLCHOLINE CHLORIDE 20 MG/ML IJ SOLN
INTRAMUSCULAR | Status: AC
  Filled 2014-03-23: qty 1

## 2014-03-23 MED ORDER — SODIUM CHLORIDE 0.9 % IR SOLN
Status: DC | PRN
  Administered 2014-03-23: 13:00:00

## 2014-03-23 SURGICAL SUPPLY — 59 items
BALLN MUSTANG 12.0X40 75 (BALLOONS) ×3
BALLN MUSTANG 5.0X20 75 (BALLOONS) ×3
BALLN MUSTANG 8X20X75 (BALLOONS) ×3
BALLOON FOX SV 3.0X20 (BALLOONS) ×3 IMPLANT
BALLOON MUSTANG 12.0X40 75 (BALLOONS) ×2 IMPLANT
BALLOON MUSTANG 5.0X20 75 (BALLOONS) ×2 IMPLANT
BALLOON MUSTANG 8X20X75 (BALLOONS) ×2 IMPLANT
BLADE SURG 10 STRL SS (BLADE) ×6 IMPLANT
CANISTER SUCTION 2500CC (MISCELLANEOUS) ×3 IMPLANT
CANNULA VESSEL 3MM 2 BLNT TIP (CANNULA) ×3 IMPLANT
CATH BEACON 5.038 65CM KMP-01 (CATHETERS) ×3 IMPLANT
CLIP TI MEDIUM 6 (CLIP) ×3 IMPLANT
CLIP TI WIDE RED SMALL 6 (CLIP) ×3 IMPLANT
CONT SPEC 4OZ CLIKSEAL STRL BL (MISCELLANEOUS) ×3 IMPLANT
COVER PROBE W GEL 5X96 (DRAPES) ×3 IMPLANT
COVER SURGICAL LIGHT HANDLE (MISCELLANEOUS) ×3 IMPLANT
DECANTER SPIKE VIAL GLASS SM (MISCELLANEOUS) ×3 IMPLANT
DERMABOND ADVANCED (GAUZE/BANDAGES/DRESSINGS) ×1
DERMABOND ADVANCED .7 DNX12 (GAUZE/BANDAGES/DRESSINGS) ×2 IMPLANT
DEVICE TORQUE H2O (MISCELLANEOUS) ×3 IMPLANT
DRAIN PENROSE 1/4X12 LTX STRL (WOUND CARE) ×3 IMPLANT
DRAPE C-ARM 42X72 X-RAY (DRAPES) ×3 IMPLANT
DRSG OPSITE 4X5.5 SM (GAUZE/BANDAGES/DRESSINGS) ×6 IMPLANT
ELECT REM PT RETURN 9FT ADLT (ELECTROSURGICAL) ×3
ELECTRODE REM PT RTRN 9FT ADLT (ELECTROSURGICAL) ×2 IMPLANT
GAUZE SPONGE 2X2 8PLY STRL LF (GAUZE/BANDAGES/DRESSINGS) ×2 IMPLANT
GEL ULTRASOUND 20GR AQUASONIC (MISCELLANEOUS) IMPLANT
GLOVE BIO SURGEON STRL SZ7.5 (GLOVE) ×3 IMPLANT
GLOVE BIOGEL PI IND STRL 6.5 (GLOVE) ×2 IMPLANT
GLOVE BIOGEL PI IND STRL 7.0 (GLOVE) ×6 IMPLANT
GLOVE BIOGEL PI IND STRL 7.5 (GLOVE) ×2 IMPLANT
GLOVE BIOGEL PI INDICATOR 6.5 (GLOVE) ×1
GLOVE BIOGEL PI INDICATOR 7.0 (GLOVE) ×3
GLOVE BIOGEL PI INDICATOR 7.5 (GLOVE) ×1
GOWN STRL REUS W/ TWL LRG LVL3 (GOWN DISPOSABLE) ×6 IMPLANT
GOWN STRL REUS W/TWL LRG LVL3 (GOWN DISPOSABLE) ×9
GUIDEWIRE ANGLED .035X150CM (WIRE) ×3 IMPLANT
KIT BASIN OR (CUSTOM PROCEDURE TRAY) ×3 IMPLANT
KIT ENCORE 26 ADVANTAGE (KITS) ×3 IMPLANT
KIT ROOM TURNOVER OR (KITS) ×3 IMPLANT
LOOP VESSEL MINI RED (MISCELLANEOUS) IMPLANT
NS IRRIG 1000ML POUR BTL (IV SOLUTION) ×3 IMPLANT
PACK CV ACCESS (CUSTOM PROCEDURE TRAY) ×3 IMPLANT
PAD ARMBOARD 7.5X6 YLW CONV (MISCELLANEOUS) ×6 IMPLANT
SET MICROPUNCTURE 5F STIFF (MISCELLANEOUS) ×3 IMPLANT
SHEATH BRITE TIP 7FRX11 (SHEATH) ×6 IMPLANT
SPONGE GAUZE 2X2 STER 10/PKG (GAUZE/BANDAGES/DRESSINGS) ×1
SPONGE SURGIFOAM ABS GEL 100 (HEMOSTASIS) IMPLANT
SUT MNCRL AB 4-0 PS2 18 (SUTURE) ×3 IMPLANT
SUT PROLENE 7 0 BV 1 (SUTURE) ×3 IMPLANT
SUT VIC AB 3-0 SH 27 (SUTURE) ×3
SUT VIC AB 3-0 SH 27X BRD (SUTURE) ×2 IMPLANT
SUT VICRYL 4-0 PS2 18IN ABS (SUTURE) ×3 IMPLANT
SYRINGE 20CC LL (MISCELLANEOUS) ×3 IMPLANT
TAPE CLOTH SURG 4X10 WHT LF (GAUZE/BANDAGES/DRESSINGS) ×3 IMPLANT
TUBING EXTENTION W/L.L. (IV SETS) ×3 IMPLANT
UNDERPAD 30X30 INCONTINENT (UNDERPADS AND DIAPERS) ×3 IMPLANT
WATER STERILE IRR 1000ML POUR (IV SOLUTION) ×3 IMPLANT
WIRE BENTSON .035X145CM (WIRE) ×3 IMPLANT

## 2014-03-23 NOTE — H&P (View-Only) (Signed)
Subjective:     Patient ID: Cynthia [name], female   DOB: Jan 10, 1971, 43 y.o.   MRN: [id number]  HPI this 43 year old female with end-stage renal disease is on home dialysis 5 days per week. She has a left upper arm AV fistula. 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 Juanda Crumble fields  in April of 2015. He had also discussed potentially performing the same procedure on the large pseudoaneurysm in the midportion of the fistula. She returns today a history of some discomfort with puncture sites and enlargement of the pseudoaneurysm. There is no history of bleeding. She is accompanied by her daughter and interpreter. She would like this further revised.  Past Medical History  Diagnosis Date  . Hypertension   . ESRD on hemodialysis     Home HD 5x per week  . Depression   . Insulin-dependent diabetes mellitus with renal complications     INSULIN DEPENDENT  . GERD (gastroesophageal reflux disease)   . Neuromuscular disorder     NEUROPATHY  . History of blood transfusion reaction 07/29/13    History  Substance Use Topics  . Smoking status: Never Smoker   . Smokeless tobacco: Never Used  . Alcohol Use: No    Family History  Problem Relation Age of Onset  . Cancer Maternal Grandmother   . Diabetes Maternal Grandfather   . Diabetes Paternal Grandmother   . Diabetes Mother   . Kidney disease Mother   . Diabetes Father   . Heart disease Father   . Kidney disease Father   . Diabetes Sister   . Asthma Sister   . Lupus Sister   . Diabetes Brother     No Known Allergies  Current outpatient prescriptions:ALPRAZolam (XANAX) 0.25 MG tablet, Take 0.25 mg by mouth 2 (two) times daily as needed for anxiety., Disp: , Rfl: ;  docusate sodium (COLACE) 100 MG capsule, Take 100 mg by mouth 2 (two) times daily., Disp: , Rfl: ;  gabapentin (NEURONTIN) 100 MG capsule, Take 100 mg by mouth at bedtime., Disp: , Rfl:  insulin glargine (LANTUS) 100 UNIT/ML  injection, Inject 0.12 mLs (12 Units total) into the skin at bedtime., Disp: 10 mL, Rfl: 11;  multivitamin (RENA-VIT) TABS tablet, Take 1 tablet by mouth daily., Disp: , Rfl: ;  sevelamer (RENVELA) 800 MG tablet, Take 2,400 mg by mouth 3 (three) times daily with meals. , Disp: , Rfl: ;  valsartan (DIOVAN) 80 MG tablet, Take 80 mg by mouth daily., Disp: , Rfl:  VITAMIN D, ERGOCALCIFEROL, PO, Take by mouth daily., Disp: , Rfl: ;  NIFEdipine (ADALAT CC) 90 MG 24 hr tablet, Take 90 mg by mouth daily., Disp: , Rfl: ;  prednisoLONE acetate (PRED FORTE) 1 % ophthalmic suspension, Place 1 drop into the right eye 4 (four) times daily., Disp: , Rfl:   BP 142/79  Pulse 87  Resp 14  Ht 5\' 1"  (1.549 m)  Wt 171 lb (77.565 kg)  BMI 32.33 kg/m2  Body mass index is 32.33 kg/(m^2).          Review of Systems denies chest pain, dyspnea on exertion. Does have occasional numbness in the arms and legs.    Objective:   Physical Exam BP 142/79  Pulse 87  Resp 14  Ht 5\' 1"  (1.549 m)  Wt 171 lb (77.565 kg)  BMI 32.33 kg/m2  General well-developed well-nourished female no apparent stress alert and oriented x3 Lungs no rhonchi or wheezing Cardiovascular regular rhythm  no murmurs Left upper extremity with upper arm fistula with good pulse and palpable thrill. Middle segment of fistula is 3 cm in diameter and also there is focal aneurysmal segment over arterial anastomosis the antecubital space. Area of previous plication in the proximal upper arm is free of any aneurysmal degeneration. No evidence of any infection.      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 Juanda Crumble fields 5 months ago now returns for similar procedure and midportion of fistula    Plan:     We'll schedule for plication of aneurysmal segment of fistula by Dr. Oneida Alar on Tuesday, September 29-discussed with patient and her  daughter via the interpreter and she would like to proceed

## 2014-03-23 NOTE — Op Note (Signed)
Procedure: Left upper extremity fistulogram, angioplasty left upper extremity AV fistula  Preoperative diagnosis: Poorly functioning left upper arm AV fistula  Postoperative diagnosis: Same  Anesthesia: Local with IV sedation  Operative findings: #1 90% stenosis just proximal to the insertion of the cephalic vein into the left subclavian vein angioplasty due to residual 25% stenosis (12 mm x 4 cm balloon)  Operative details: After an informed consent, the patient to the operating room. The patient was placed in supine position the Angio table. The patient's entire left upper extremity is prepped and draped in usual sterile fashion. Local anesthesia was infiltrated over the proximal portion of the left upper arm AV fistula. A micropuncture needle was used to cannulate the fistula without difficulty. The micropuncture was threaded into the AV fistula. The needle was removed and the micropuncture sheath placed over the guidewire into the AV fistula. There was some tortuosity to next a contrast angiogram was performed. This showed widely patent central veins. The AV fistula had to the aneurysm is proximal and middle third, there were some tortuosity. Otherwise AV fistulas by the plate and except for one focal stenosis approximately 3 cm above the insertion of the cephalic vein into the subclavian vein. This was approximately 90%. Pressure was also held across the upper portion of the fistula to allow contrast refluxed route and the arterial anastomosis and this was also widely patent. At this point the patient was given 3000 units of intravenous heparin. A micropuncture sheath was changed over a Benson wire for a 7 [place] short sheath. This was thoroughly flushed with heparinized saline. An 035 angled Glidewire was used to advance across the stenosis. A 5 [place] KMP catheter was then placed over the guidewire into the right atrium. Glidewire was then exchanged back for the [facility] wire. Contrast angiogram was  performed to determine the precise level of stenosis. Next serial dilations were performed first with a 5 mm followed by an 88 mm followed by a 12 mm x 4 mm angioplasty balloon were inflated to nominal pressure of 8 atmospheres at the level of the lesion. There was a good waist with the 12 mm balloon. The patient felt some pressure with insufflation of this. This was inflated to 8 atmospheres for 1 minute and then deflated. The patient experienced some pressure but this quickly well away with deflation of the balloon. Completion contrast angiogram was performed which are residual 25% stenosis of the fistula. There was no extravasation or dissection. Since the patient had experienced some pressure with a 12 balloon I did not think that going any higher in balloon diameter would be safe. At this point the balloon and guidewire was removed. A 3-0 Monocryl stitch was placed as a pursestring at the sheath exit site. Pressure was held below and above the sheath the sheath was removed and the suture secured down. Hemostasis was obtained with 5 minutes of direct pressure. The patient tolerated procedure well and there were no complications. Patient in the holding area in stable condition.  Ruta Hinds, MD [facility] Office: [phone] Pager: [phone]

## 2014-03-23 NOTE — Interval H&P Note (Signed)
History and Physical Interval Note:  03/23/2014 12:31 PM  Cynthia [name]  has presented today for surgery, with the diagnosis of End stage renal disease; Other complications due to renal dialysis device, implant, and graft  The various methods of treatment have been discussed with the patient and family. After consideration of risks, benefits and other options for treatment, the patient has consented to  Procedure(s): PLICATION OF BRACHIOCEPHALIC ARTERIOVENOUS FISTULA (Left) as a surgical intervention .  The patient's history has been reviewed, patient examined, no change in status, stable for surgery.  I have reviewed the patient's chart and labs.  Questions were answered to the patient's satisfaction.     Arley Garant E

## 2014-03-23 NOTE — Anesthesia Postprocedure Evaluation (Signed)
  Anesthesia Post-op Note  Patient: Cynthia [name]  Procedure(s) Performed: Procedure(s): SHUNTOGRAM and angioplasty of left arm av fistula (Left)  Patient Location: PACU  Anesthesia Type:MAC  Level of Consciousness: awake and alert   Airway and Oxygen Therapy: Patient Spontanous Breathing  Post-op Pain: none  Post-op Assessment: Post-op Vital signs reviewed, Patient's Cardiovascular Status Stable, Respiratory Function Stable, Patent Airway, No signs of Nausea or vomiting and Pain level controlled  Post-op Vital Signs: Reviewed and stable  Last Vitals:  Filed Vitals:   03/23/14 1540  BP: 115/50  Pulse: 69  Temp:   Resp: 14    Complications: No apparent anesthesia complications

## 2014-03-23 NOTE — Discharge Instructions (Signed)

## 2014-03-23 NOTE — Progress Notes (Signed)
Pt has pain over proximal fistula and difficulty cannulating distal fistula away from aneurysm.  She denies problems with the central aneurysm.  Discussed with pt via interpreter.  Will do shuntogram today if c arm available possible PTA.  If not available will reschedule.  Also offered pt new graft and diatek today but currently she does not want this.  Ruta Hinds, MD [facility] Office: [phone] Pager: [phone]

## 2014-03-23 NOTE — Transfer of Care (Signed)
Immediate Anesthesia Transfer of Care Note  Patient: Cynthia [name]  Procedure(s) Performed: Procedure(s): SHUNTOGRAM and angioplasty of left arm av fistula (Left)  Patient Location: PACU  Anesthesia Type:MAC  Level of Consciousness: awake, alert  and oriented  Airway & Oxygen Therapy: Patient Spontanous Breathing  Post-op Assessment: Report given to PACU RN, Post -op Vital signs reviewed and stable and Patient moving all extremities  Post vital signs: Reviewed and stable  Complications: No apparent anesthesia complications

## 2014-03-23 NOTE — Anesthesia Procedure Notes (Signed)
Procedure Name: MAC Date/Time: 03/23/2014 1:20 PM Performed by: Trixie Deis A Pre-anesthesia Checklist: Patient identified, Timeout performed, Suction available, Emergency Drugs available and Patient being monitored Oxygen Delivery Method: Simple face mask Placement Confirmation: positive ETCO2

## 2014-03-23 NOTE — Anesthesia Preprocedure Evaluation (Signed)
Anesthesia Evaluation  Patient identified by MRN, date of birth, ID band Patient awake    Reviewed: Allergy & Precautions, H&P , NPO status , Patient's Chart, lab work & pertinent test results  History of Anesthesia Complications Negative for: history of anesthetic complications  Airway Mallampati: II TM Distance: >3 FB Neck ROM: Full    Dental  (+) Teeth Intact   Pulmonary neg pulmonary ROS,  breath sounds clear to auscultation        Cardiovascular hypertension, Pt. on medications - angina- Past MI and - CHF - dysrhythmias Rhythm:Regular     Neuro/Psych PSYCHIATRIC DISORDERS Depression negative neurological ROS     GI/Hepatic GERD-  Medicated,  Endo/Other  diabetes, Type 2, Insulin DependentMorbid obesity  Renal/GU ESRF and DialysisRenal diseaseLast dialysis Mon, no issues, LUE fistula     Musculoskeletal   Abdominal   Peds  Hematology  (+) anemia ,   Anesthesia Other Findings   Reproductive/Obstetrics                           Anesthesia Physical Anesthesia Plan  ASA: III  Anesthesia Plan: MAC   Post-op Pain Management:    Induction:   Airway Management Planned: Natural Airway  Additional Equipment: None  Intra-op Plan:   Post-operative Plan:   Informed Consent: I have reviewed the patients History and Physical, chart, labs and discussed the procedure including the risks, benefits and alternatives for the proposed anesthesia with the patient or authorized representative who has indicated his/her understanding and acceptance.   Dental advisory given  Plan Discussed with: Surgeon and CRNA  Anesthesia Plan Comments:         Anesthesia Quick Evaluation

## 2014-03-29 ENCOUNTER — Observation Stay (HOSPITAL_COMMUNITY)
Admission: EM | Admit: 2014-03-29 | Discharge: 2014-03-31 | Disposition: A | Discharge status: Home / Self Care | Payer: Medicare Other | Attending: Family Medicine | Admitting: Family Medicine

## 2014-03-29 ENCOUNTER — Encounter (HOSPITAL_COMMUNITY): Payer: Self-pay | Admitting: Emergency Medicine

## 2014-03-29 ENCOUNTER — Observation Stay (HOSPITAL_COMMUNITY): Payer: Medicare Other

## 2014-03-29 ENCOUNTER — Emergency Department (HOSPITAL_COMMUNITY): Payer: Medicare Other

## 2014-03-29 DIAGNOSIS — I5031 Acute diastolic (congestive) heart failure: Secondary | ICD-10-CM

## 2014-03-29 DIAGNOSIS — E875 Hyperkalemia: Secondary | ICD-10-CM

## 2014-03-29 DIAGNOSIS — E119 Type 2 diabetes mellitus without complications: Secondary | ICD-10-CM

## 2014-03-29 DIAGNOSIS — Z7682 Awaiting organ transplant status: Secondary | ICD-10-CM

## 2014-03-29 DIAGNOSIS — IMO0001 Reserved for inherently not codable concepts without codable children: Secondary | ICD-10-CM

## 2014-03-29 DIAGNOSIS — R0602 Shortness of breath: Secondary | ICD-10-CM

## 2014-03-29 DIAGNOSIS — I2510 Atherosclerotic heart disease of native coronary artery without angina pectoris: Secondary | ICD-10-CM | POA: Insufficient documentation

## 2014-03-29 DIAGNOSIS — R072 Precordial pain: Secondary | ICD-10-CM | POA: Diagnosis present

## 2014-03-29 DIAGNOSIS — Z794 Long term (current) use of insulin: Secondary | ICD-10-CM | POA: Insufficient documentation

## 2014-03-29 DIAGNOSIS — F419 Anxiety disorder, unspecified: Secondary | ICD-10-CM | POA: Insufficient documentation

## 2014-03-29 DIAGNOSIS — I2584 Coronary atherosclerosis due to calcified coronary lesion: Secondary | ICD-10-CM | POA: Diagnosis not present

## 2014-03-29 DIAGNOSIS — I10 Essential (primary) hypertension: Secondary | ICD-10-CM

## 2014-03-29 DIAGNOSIS — J810 Acute pulmonary edema: Secondary | ICD-10-CM

## 2014-03-29 DIAGNOSIS — E1121 Type 2 diabetes mellitus with diabetic nephropathy: Secondary | ICD-10-CM | POA: Diagnosis not present

## 2014-03-29 DIAGNOSIS — R202 Paresthesia of skin: Secondary | ICD-10-CM | POA: Insufficient documentation

## 2014-03-29 DIAGNOSIS — R079 Chest pain, unspecified: Secondary | ICD-10-CM

## 2014-03-29 DIAGNOSIS — E785 Hyperlipidemia, unspecified: Secondary | ICD-10-CM | POA: Insufficient documentation

## 2014-03-29 DIAGNOSIS — K219 Gastro-esophageal reflux disease without esophagitis: Secondary | ICD-10-CM | POA: Insufficient documentation

## 2014-03-29 DIAGNOSIS — D631 Anemia in chronic kidney disease: Secondary | ICD-10-CM | POA: Insufficient documentation

## 2014-03-29 DIAGNOSIS — N186 End stage renal disease: Secondary | ICD-10-CM

## 2014-03-29 DIAGNOSIS — R0789 Other chest pain: Principal | ICD-10-CM

## 2014-03-29 DIAGNOSIS — Z992 Dependence on renal dialysis: Secondary | ICD-10-CM | POA: Insufficient documentation

## 2014-03-29 DIAGNOSIS — I120 Hypertensive chronic kidney disease with stage 5 chronic kidney disease or end stage renal disease: Secondary | ICD-10-CM | POA: Insufficient documentation

## 2014-03-29 DIAGNOSIS — E1129 Type 2 diabetes mellitus with other diabetic kidney complication: Secondary | ICD-10-CM

## 2014-03-29 LAB — BASIC METABOLIC PANEL
ANION GAP: 13 (ref 5–15)
Anion gap: 15 (ref 5–15)
BUN: 58 mg/dL — ABNORMAL HIGH (ref 6–23)
BUN: 19 mg/dL (ref 6–23)
CALCIUM: 9.2 mg/dL (ref 8.4–10.5)
CO2: 25 mEq/L (ref 19–32)
CO2: 26 mEq/L (ref 19–32)
Calcium: 8.9 mg/dL (ref 8.4–10.5)
Chloride: 93 mEq/L — ABNORMAL LOW (ref 96–112)
Chloride: 95 mEq/L — ABNORMAL LOW (ref 96–112)
Creatinine, Ser: 9.35 mg/dL — ABNORMAL HIGH (ref 0.50–1.10)
Creatinine, Ser: 4.37 mg/dL — ABNORMAL HIGH (ref 0.50–1.10)
GFR calc Af Amer: 5 mL/min — ABNORMAL LOW (ref >90)
GFR calc Af Amer: 13 mL/min — ABNORMAL LOW (ref >90)
GFR calc non Af Amer: 11 mL/min — ABNORMAL LOW (ref >90)
GFR, EST NON AFRICAN AMERICAN: 4 mL/min — AB (ref >90)
Glucose, Bld: 172 mg/dL — ABNORMAL HIGH (ref 70–99)
Glucose, Bld: 140 mg/dL — ABNORMAL HIGH (ref 70–99)
Potassium: 6.5 mEq/L (ref 3.7–5.3)
Potassium: 5.2 mEq/L (ref 3.7–5.3)
Sodium: 131 mEq/L — ABNORMAL LOW (ref 137–147)
Sodium: 136 mEq/L — ABNORMAL LOW (ref 137–147)

## 2014-03-29 LAB — COMPREHENSIVE METABOLIC PANEL
ALT: 12 U/L (ref 0–35)
AST: 12 U/L (ref 0–37)
Albumin: 3.6 g/dL (ref 3.5–5.2)
Alkaline Phosphatase: 120 U/L — ABNORMAL HIGH (ref 39–117)
Anion gap: 16 — ABNORMAL HIGH (ref 5–15)
BUN: 56 mg/dL — ABNORMAL HIGH (ref 6–23)
CO2: 25 mEq/L (ref 19–32)
Calcium: 9.5 mg/dL (ref 8.4–10.5)
Chloride: 92 mEq/L — ABNORMAL LOW (ref 96–112)
Creatinine, Ser: 8.38 mg/dL — ABNORMAL HIGH (ref 0.50–1.10)
GFR calc Af Amer: 6 mL/min — ABNORMAL LOW (ref >90)
GFR calc non Af Amer: 5 mL/min — ABNORMAL LOW (ref >90)
Glucose, Bld: 226 mg/dL — ABNORMAL HIGH (ref 70–99)
Potassium: 4.8 mEq/L (ref 3.7–5.3)
Sodium: 133 mEq/L — ABNORMAL LOW (ref 137–147)
Total Bilirubin: 0.2 mg/dL — ABNORMAL LOW (ref 0.3–1.2)
Total Protein: 7.2 g/dL (ref 6.0–8.3)

## 2014-03-29 LAB — GLUCOSE, CAPILLARY
GLUCOSE-CAPILLARY: 156 mg/dL — AB (ref 70–99)
Glucose-Capillary: 148 mg/dL — ABNORMAL HIGH (ref 70–99)
Glucose-Capillary: 176 mg/dL — ABNORMAL HIGH (ref 70–99)

## 2014-03-29 LAB — LIPID PANEL
CHOL/HDL RATIO: 5.2 ratio
Cholesterol: 162 mg/dL (ref 0–200)
HDL: 31 mg/dL — ABNORMAL LOW (ref >39)
LDL CALC: 74 mg/dL (ref 0–99)
TRIGLYCERIDES: 284 mg/dL — AB (ref <150)
VLDL: 57 mg/dL — AB (ref 0–40)

## 2014-03-29 LAB — CBC
HCT: 27 % — ABNORMAL LOW (ref 36.0–46.0)
Hemoglobin: 9.1 g/dL — ABNORMAL LOW (ref 12.0–15.0)
MCH: 32.3 pg (ref 26.0–34.0)
MCHC: 33.7 g/dL (ref 30.0–36.0)
MCV: 95.7 fL (ref 78.0–100.0)
Platelets: 159 10*3/uL (ref 150–400)
RBC: 2.82 MIL/uL — ABNORMAL LOW (ref 3.87–5.11)
RDW: 14.2 % (ref 11.5–15.5)
WBC: 6.4 10*3/uL (ref 4.0–10.5)

## 2014-03-29 LAB — TROPONIN I: Troponin I: <0.3 ng/mL (ref <0.30)

## 2014-03-29 LAB — HEPATITIS B SURFACE ANTIGEN: Hepatitis B Surface Ag: NEGATIVE

## 2014-03-29 LAB — I-STAT TROPONIN, ED: Troponin i, poc: 0 ng/mL (ref 0.00–0.08)

## 2014-03-29 LAB — MAGNESIUM: Magnesium: 2.5 mg/dL (ref 1.5–2.5)

## 2014-03-29 LAB — T4, FREE: Free T4: 0.87 ng/dL (ref 0.80–1.80)

## 2014-03-29 LAB — PHOSPHORUS: Phosphorus: 4.7 mg/dL — ABNORMAL HIGH (ref 2.3–4.6)

## 2014-03-29 LAB — HEMOGLOBIN A1C
Hgb A1c MFr Bld: 6.8 % — ABNORMAL HIGH (ref <5.7)
MEAN PLASMA GLUCOSE: 148 mg/dL — AB (ref <117)

## 2014-03-29 LAB — TSH: TSH: 5.8 u[IU]/mL — AB (ref 0.350–4.500)

## 2014-03-29 MED ORDER — SEVELAMER CARBONATE 800 MG PO TABS
3200.0000 mg | ORAL_TABLET | Freq: Three times a day (TID) | ORAL | Status: DC
  Administered 2014-03-29 – 2014-03-31 (×3): 3200 mg via ORAL
  Filled 2014-03-29 (×10): qty 4

## 2014-03-29 MED ORDER — LIDOCAINE-PRILOCAINE 2.5-2.5 % EX CREA
1.0000 | TOPICAL_CREAM | CUTANEOUS | Status: DC | PRN
Start: 2014-03-29 — End: 2014-03-29
  Filled 2014-03-29: qty 5

## 2014-03-29 MED ORDER — DARBEPOETIN ALFA-POLYSORBATE 60 MCG/0.3ML IJ SOLN
60.0000 ug | INTRAMUSCULAR | Status: DC
  Administered 2014-03-31: 60 ug via INTRAVENOUS
  Filled 2014-03-29: qty 0.3

## 2014-03-29 MED ORDER — INSULIN ASPART 100 UNIT/ML ~~LOC~~ SOLN: 0.0000 [IU] | Freq: Three times a day (TID) | SUBCUTANEOUS | Status: DC

## 2014-03-29 MED ORDER — RENA-VITE PO TABS
1.0000 | ORAL_TABLET | Freq: Every day | ORAL | Status: DC
  Filled 2014-03-29: qty 1

## 2014-03-29 MED ORDER — INSULIN GLARGINE 100 UNIT/ML ~~LOC~~ SOLN
10.0000 [IU] | Freq: Every day | SUBCUTANEOUS | Status: DC
  Administered 2014-03-29 – 2014-03-30 (×2): 10 [IU] via SUBCUTANEOUS
  Filled 2014-03-29 (×4): qty 0.1

## 2014-03-29 MED ORDER — MORPHINE SULFATE 2 MG/ML IJ SOLN: 2.0000 mg | INTRAMUSCULAR | Status: DC | PRN

## 2014-03-29 MED ORDER — HEPARIN SODIUM (PORCINE) 1000 UNIT/ML DIALYSIS: 1000.0000 [IU] | INTRAMUSCULAR | Status: DC | PRN

## 2014-03-29 MED ORDER — HEPARIN SODIUM (PORCINE) 1000 UNIT/ML DIALYSIS
2000.0000 [IU] | Freq: Once | INTRAMUSCULAR | Status: DC
Start: 2014-03-29 — End: 2014-03-29

## 2014-03-29 MED ORDER — NITROGLYCERIN 0.4 MG SL SUBL
0.4000 mg | SUBLINGUAL_TABLET | SUBLINGUAL | Status: DC | PRN
  Administered 2014-03-29: 0.4 mg via SUBLINGUAL

## 2014-03-29 MED ORDER — NITROGLYCERIN 0.4 MG SL SUBL
SUBLINGUAL_TABLET | SUBLINGUAL | Status: AC
  Filled 2014-03-29: qty 1

## 2014-03-29 MED ORDER — ALTEPLASE 2 MG IJ SOLR
2.0000 mg | Freq: Once | INTRAMUSCULAR | Status: DC | PRN
  Filled 2014-03-29: qty 2

## 2014-03-29 MED ORDER — ASPIRIN EC 81 MG PO TBEC
81.0000 mg | DELAYED_RELEASE_TABLET | Freq: Every day | ORAL | Status: DC
  Administered 2014-03-29 – 2014-03-31 (×3): 81 mg via ORAL
  Filled 2014-03-29 (×3): qty 1

## 2014-03-29 MED ORDER — METOPROLOL TARTRATE 50 MG PO TABS
50.0000 mg | ORAL_TABLET | Freq: Two times a day (BID) | ORAL | Status: DC
  Administered 2014-03-29 – 2014-03-31 (×5): 50 mg via ORAL
  Filled 2014-03-29 (×6): qty 1

## 2014-03-29 MED ORDER — DOCUSATE SODIUM 100 MG PO CAPS
100.0000 mg | ORAL_CAPSULE | Freq: Two times a day (BID) | ORAL | Status: DC
  Administered 2014-03-29 – 2014-03-31 (×4): 100 mg via ORAL
  Filled 2014-03-29 (×6): qty 1

## 2014-03-29 MED ORDER — LIDOCAINE HCL (PF) 1 % IJ SOLN
5.0000 mL | INTRAMUSCULAR | Status: DC | PRN
Start: 2014-03-29 — End: 2014-03-29

## 2014-03-29 MED ORDER — SODIUM CHLORIDE 0.9 % IV SOLN
1.0000 g | Freq: Once | INTRAVENOUS | Status: DC
  Filled 2014-03-29: qty 10

## 2014-03-29 MED ORDER — SODIUM CHLORIDE 0.9 % IV SOLN
100.0000 mL | INTRAVENOUS | Status: DC | PRN
Start: 2014-03-29 — End: 2014-03-29

## 2014-03-29 MED ORDER — PENTAFLUOROPROP-TETRAFLUOROETH EX AERO: 1.0000 "application " | INHALATION_SPRAY | CUTANEOUS | Status: DC | PRN

## 2014-03-29 MED ORDER — METOPROLOL TARTRATE 1 MG/ML IV SOLN
INTRAVENOUS | Status: AC
  Filled 2014-03-29: qty 5

## 2014-03-29 MED ORDER — RENA-VITE PO TABS
1.0000 | ORAL_TABLET | Freq: Every day | ORAL | Status: DC
  Administered 2014-03-29 – 2014-03-30 (×2): 1 via ORAL
  Filled 2014-03-29 (×3): qty 1

## 2014-03-29 MED ORDER — ACETAMINOPHEN 325 MG PO TABS
650.0000 mg | ORAL_TABLET | ORAL | Status: DC | PRN
  Administered 2014-03-30: 650 mg via ORAL
  Filled 2014-03-29: qty 2

## 2014-03-29 MED ORDER — SODIUM POLYSTYRENE SULFONATE 15 GM/60ML PO SUSP
15.0000 g | Freq: Once | ORAL | Status: DC
  Filled 2014-03-29: qty 60

## 2014-03-29 MED ORDER — ONDANSETRON HCL 4 MG/2ML IJ SOLN
4.0000 mg | Freq: Once | INTRAMUSCULAR | Status: AC
  Administered 2014-03-29: 4 mg via INTRAVENOUS
  Filled 2014-03-29: qty 2

## 2014-03-29 MED ORDER — HEPARIN SODIUM (PORCINE) 5000 UNIT/ML IJ SOLN
5000.0000 [IU] | Freq: Three times a day (TID) | INTRAMUSCULAR | Status: DC
  Administered 2014-03-29 – 2014-03-31 (×4): 5000 [IU] via SUBCUTANEOUS
  Filled 2014-03-29 (×6): qty 1

## 2014-03-29 MED ORDER — GABAPENTIN 100 MG PO CAPS
100.0000 mg | ORAL_CAPSULE | Freq: Every day | ORAL | Status: DC
  Administered 2014-03-29 – 2014-03-30 (×2): 100 mg via ORAL
  Filled 2014-03-29 (×3): qty 1

## 2014-03-29 MED ORDER — MORPHINE SULFATE 4 MG/ML IJ SOLN
4.0000 mg | Freq: Once | INTRAMUSCULAR | Status: AC
  Administered 2014-03-29: 4 mg via INTRAVENOUS
  Filled 2014-03-29: qty 1

## 2014-03-29 MED ORDER — SIMETHICONE 80 MG PO CHEW
80.0000 mg | CHEWABLE_TABLET | Freq: Once | ORAL | Status: AC
  Administered 2014-03-29: 80 mg via ORAL
  Filled 2014-03-29: qty 1

## 2014-03-29 MED ORDER — IRBESARTAN 300 MG PO TABS
300.0000 mg | ORAL_TABLET | Freq: Every day | ORAL | Status: DC
  Administered 2014-03-30 – 2014-03-31 (×2): 300 mg via ORAL
  Filled 2014-03-29 (×3): qty 1

## 2014-03-29 MED ORDER — SODIUM CHLORIDE 0.9 % IV SOLN: 100.0000 mL | INTRAVENOUS | Status: DC | PRN

## 2014-03-29 MED ORDER — LORAZEPAM 1 MG PO TABS: 1.0000 mg | ORAL_TABLET | Freq: Four times a day (QID) | ORAL | Status: DC | PRN

## 2014-03-29 MED ORDER — ATORVASTATIN CALCIUM 10 MG PO TABS
10.0000 mg | ORAL_TABLET | Freq: Every day | ORAL | Status: DC
  Filled 2014-03-29 (×3): qty 1

## 2014-03-29 MED ORDER — PANTOPRAZOLE SODIUM 40 MG PO TBEC
40.0000 mg | DELAYED_RELEASE_TABLET | Freq: Every day | ORAL | Status: DC
  Administered 2014-03-29 – 2014-03-31 (×3): 40 mg via ORAL
  Filled 2014-03-29 (×3): qty 1

## 2014-03-29 MED ORDER — NEPRO/CARBSTEADY PO LIQD
237.0000 mL | ORAL | Status: DC | PRN
Start: 2014-03-29 — End: 2014-03-29
  Filled 2014-03-29: qty 237

## 2014-03-29 MED ORDER — IOHEXOL 350 MG/ML SOLN: 100.0000 mL | Freq: Once | INTRAVENOUS | Status: AC | PRN

## 2014-03-29 MED ORDER — SODIUM POLYSTYRENE SULFONATE 15 GM/60ML PO SUSP
30.0000 g | Freq: Once | ORAL | Status: AC
  Administered 2014-03-29: 30 g via ORAL
  Filled 2014-03-29: qty 120

## 2014-03-29 MED ORDER — ONDANSETRON HCL 4 MG/2ML IJ SOLN: 4.0000 mg | Freq: Four times a day (QID) | INTRAMUSCULAR | Status: DC | PRN

## 2014-03-29 MED ORDER — OMEPRAZOLE MAGNESIUM 20 MG PO TBEC: 20.0000 mg | DELAYED_RELEASE_TABLET | Freq: Every day | ORAL | Status: DC

## 2014-03-29 NOTE — Consult Note (Addendum)
CARDIOLOGY CONSULT NOTE   Patient ID: Cynthia [name] MRN: [id number], DOB/AGE: [birth year]   Admit date: 03/29/2014 Date of Consult: 03/29/2014  Primary Physician: Rexene Agent, MD Primary Cardiologist: Dorothy Spark  Reason for consult:  Chest pain during HD  Problem List  Past Medical History  Diagnosis Date  . Hypertension   . ESRD on hemodialysis     Home HD 5x per week  . Depression   . Insulin-dependent diabetes mellitus with renal complications     INSULIN DEPENDENT  . GERD (gastroesophageal reflux disease)   . Neuromuscular disorder     NEUROPATHY  . History of blood transfusion     transfusion reaction  . Anemia     Past Surgical History  Procedure Laterality Date  . Cholecystectomy    . Abdominal hysterectomy    . Breast surgery      biopsy bilateral  . Tubal ligation    . Dialysis fistula creation Left   . Eye surgery Bilateral     lazer  . Revison of arteriovenous fistula Left 09/28/2013    Procedure: EXCISE ESCHAR LEFT ARM  ARTERIOVENOUS FISTULA WITH PLICATION OF LEFT ARM ARTERIOVENOUS FISTULA;  Surgeon: Elam Dutch, MD;  Location: [facility];  Service: Vascular;  Laterality: Left;    Allergies  No Known Allergies  HPI   Cynthia [name] is a 43 y.o. female presenting with acute onset chest pain during dialysis treatment. The pain was retrosternal, pressure like and radiated to her neck and was associated with shortness of breath. This happened at 9 pm the last night. The HD was terminated prematurely and she was transfered to [facility] ER. The pain resolved after HD and she is currently chest pain free. The patient was admitted with similar presentation in February and underwent a Lexiscan nuclear stress test that was negative for ischemia. She states that she gets occasional pressure  During HD but not as severe as those two episodes.  PMH is significant for end-stage renal disease on dialysis, hypertension, insulin-dependent diabetes, chest  pain, left upper extremity dialysis fistula, paresthesias.  She was also admitted in May 2015 with an episode of pulmonary edema due to 2 missed hemodialysis.   Inpatient Medications  . aspirin EC  81 mg Oral Daily  . docusate sodium  100 mg Oral BID  . gabapentin  100 mg Oral QHS  . heparin  5,000 Units Subcutaneous 3 times per day  . insulin aspart  0-9 Units Subcutaneous TID WC  . insulin glargine  10 Units Subcutaneous Daily  . irbesartan  300 mg Oral Daily  . multivitamin  1 tablet Oral QHS  . pantoprazole  40 mg Oral Daily  . sevelamer carbonate  3,200 mg Oral TID WC   Family History Family History  Problem Relation Age of Onset  . Cancer Maternal Grandmother   . Diabetes Maternal Grandfather   . Diabetes Paternal Grandmother   . Diabetes Mother   . Kidney disease Mother   . Diabetes Father   . Heart disease Father   . Kidney disease Father   . Diabetes Sister   . Asthma Sister   . Lupus Sister   . Diabetes Brother     Social History History   Social History  . Marital Status: Legally Separated    Spouse Name: N/A    Number of Children: N/A  . Years of Education: N/A   Occupational History  . disabled    Social History Main Topics  .  Smoking status: Never Smoker   . Smokeless tobacco: Never Used  . Alcohol Use: No  . Drug Use: No  . Sexual Activity: Yes   Other Topics Concern  . Not on file   Social History Narrative  . No narrative on file    Review of Systems  General:  No chills, fever, night sweats or weight changes.  Cardiovascular:  No chest pain, dyspnea on exertion, edema, orthopnea, palpitations, paroxysmal nocturnal dyspnea. Dermatological: No rash, lesions/masses Respiratory: No cough, dyspnea Urologic: No hematuria, dysuria Abdominal:   No nausea, vomiting, diarrhea, bright red blood per rectum, melena, or hematemesis Neurologic:  No visual changes, wkns, changes in mental status. All other systems reviewed and are otherwise negative  except as noted above.  Physical Exam  Blood pressure 167/65, pulse 74, temperature 98 F (36.7 C), temperature source Oral, resp. rate 20, height 5\' 1"  (1.549 m), weight 171 lb 14.4 oz (77.973 kg), SpO2 100.00%.  General: Pleasant, NAD Psych: Normal affect. Neuro: Alert and oriented X 3. Moves all extremities spontaneously. HEENT: Normal  Neck: Supple without bruits or JVD. Lungs:  Resp regular and unlabored, CTA. Heart: RRR no s3, s4, 2/6 holosystolic murmur. Abdomen: Soft, non-tender, non-distended, BS + x 4.  Extremities: No clubbing, cyanosis or edema. DP/PT/Radials 2+ and equal bilaterally.  Labs  Recent Labs  03/29/14 0616  TROPONINI <0.30   Lab Results  Component Value Date   WBC 6.4 03/29/2014   HGB 9.1* 03/29/2014   HCT 27.0* 03/29/2014   MCV 95.7 03/29/2014   PLT 159 03/29/2014    Recent Labs Lab 03/29/14 0118 03/29/14 0616  NA 133* 131*  K 4.8 6.5*  CL 92* 93*  CO2 25 25  BUN 56* 58*  CREATININE 8.38* 9.35*  CALCIUM 9.5 9.2  PROT 7.2  --   BILITOT 0.2*  --   ALKPHOS 120*  --   ALT 12  --   AST 12  --   GLUCOSE 226* 172*   Lab Results  Component Value Date   CHOL 162 03/29/2014   HDL 31* 03/29/2014   LDLCALC 74 03/29/2014   TRIG 284* 03/29/2014   Radiology/Studies  Dg Chest Portable 1 View  03/29/2014   CLINICAL DATA:  Acute onset of sharp central chest pain, with associated chest pressure. Left arm numbness and tingling. Symptoms started tonight during dialysis. Initial encounter.  EXAM: PORTABLE CHEST - 1 VIEW  COMPARISON:  Chest radiograph performed 12/31/2013  FINDINGS: The lungs are well-aerated and clear. There is no evidence of focal opacification, pleural effusion or pneumothorax.  The cardiomediastinal silhouette is within normal limits. No acute osseous abnormalities are seen.  IMPRESSION: No acute cardiopulmonary process seen.     Dg C-arm 1-60 Min  03/23/2014   CLINICAL DATA:  Shuntogram.  EXAM: DG C-ARM 61-120 MIN; LEFT HUMERUS - 2+ VIEW   COMPARISON:  None.  FINDINGS: Multiple images are submitted from a left upper arm fistulogram. These demonstrate mild focal stenosis within the central cephalic vein. Imaging demonstrates balloon dilatation of this area with improved appearance post dilatation. Slight residual stenosis common likely not hemodynamically significant.  IMPRESSION: Left upper extremity av fistulagram as above.   Electronically Signed   By: Rolm Baptise M.D.   On: 03/23/2014 16:14   Echocardiogram - none  ECG: SR, LAD, unchanged from prior on 11/15/2013  Lexiscan Nuclear stress test: 07/29/2013 FINDINGS:  Resting HR 82 Resting BP 174/59; Max HR 100 and max BP 168/57. No  chest pain or  ST changes. Images were reconstructed in the short  axis as welll as the vertical and horizontal long axis; stress  images reveal a defect in the distal anterior wall and apex. When  compared to the rest images, no reversibility noted. TID-1.16; EDV  93 ml; EDV 36 ml.  IMPRESSION:  [facility] nuclear study with no chest pain and no ST changes; images  show probable breast attenuation and no ischemia; gated EF 62% and  wall motion normal.      ASSESSMENT AND PLAN  43 year old female with ESRD on home HD, DM and HTN  1. Chest pain - negative stress test i February 2015  - the first troponin negative, unchanged ECG from priors, we will await another troponin - Hyperkalemia that needs to be corrected by HD, once corrected we will perform coronary CT in the afternoon  2. Hypertension - add metoprolol 50 mg po bid  3. Hyperlipidemia - add atorvastatin 10 mg po daily  4. Hyperkalemia - give Kayexylate, HD per nephrology recommendations   Signed, Dorothy Spark, MD, [facility] 03/29/2014, 8:34 AM

## 2014-03-29 NOTE — H&P (Signed)
Family Medicine Teaching Service Attending Note  I interviewed and examined patient Cynthia [name] and reviewed their tests and x-rays.  I discussed with Dr. Raliegh Ip and reviewed their note for today.  I agree with their assessment and plan.     Additionally  Feeling no chest pain this am - is tired Cardiology to see to recommend evaluation Renal to see for CRF and hyperkalemia

## 2014-03-29 NOTE — H&P (Signed)
[facility] Admission History and Physical Service Pager: [phone]  Patient name: Cynthia [name] Medical record number: [id number] Date of birth: June 18, 1971 Age: 43 y.o. Gender: female  Primary Care Provider: Rexene Agent, MD Consultants: Cardiology, nephrology  Code Status: Full (this was discussed with pt at bedside)  Chief Complaint: Acute onset chest pain  Assessment and Plan: Cynthia [name] is a 43 y.o. female presenting with acute onset chest pain during dialysis treatment. PMH is significant for end-stage renal disease on dialysis, hypertension, insulin-dependent diabetes, atypical chest pain, left upper extremity dialysis fistula, paresthesias.  Chest pain, acute - heart score of 4 on admission; pain localized to the left side with radiation down the left arm and up the left neck. Associated shortness of breath and numbness/tingling of the left arm. This was relieved in the ED with morphine. Refractory to nitroglycerin. No change in EKG from previous on 07/29/13. I-STAT troponins negative, CXR negative. Patient currently well-appearing, chest pain free and asymptomatic, however concern for ACS given heart score, CAD equivalent with DM, and hx with typical features, plan to rule out MI. Similar episode (worse CP) with hospitalization 07/2013 and recent stress test (lexiscan without ischemia, EF 62%) - Admit for observation - Serial troponins every 6 hours; pending - BMP, lipid panel, A1c, TSH; pending - Aspirin 81 mg daily; nitroglycerin 0.4 mg when necessary - Morphine 2 mg every 2 hours when necessary - Protonix 40 mg daily - Zofran 4 mg every 6 hours when necessary for nausea - Repeat EKG in the morning - Consult cardiology; will call in the morning - Colace for constipation secondary to morphine use  End stage renal disease - patient is on home dialysis 5 times per week. 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 Suspect some of patient's symptoms with fatigue and DOE may be related to chronic anemia. - Continue to monitor with CBC  Hypertension Initially elevated in ED to 170/70, since improved - Irbesartan 300 mg daily  Insulin-dependent diabetes - last A1c 5.8 on 11/16/13 - Lantus 10 units daily (home regimen) - Sliding scale insulin; sensitive scale  Paresthesias - Gabapentin 100 mg daily  Anxiety - Ativan 1 mg every 6 hours when necessary  FEN/GI: No IV fluids at this time due to hemodialysis patient; n.p.o. in case cardiology needs to perform procedures. Prophylaxis: Heparin subcutaneous   Disposition: Admitted for observation for chest pain rule out. Discharge home once cleared by cardiology, nephrology, and our team.  History of Present Illness: Cynthia [name] is a 43 y.o. female presenting with chest pain and tightness. Patient was at home performing dialysis when she experienced chest pain and tightness/pressure towards the end of one of her dialysis treatments (which she gets 5x/week). Pain is localized to the left side of her chest and radiated down her left arm and up the left side of her neck. Pain is described as 10 out of 10 on initial presentation. At the time of presentation patient took aspirin and nitroglycerin (2-3 times) without noted improvement. EMS was called. Patient received 4 mg morphine in the ED. Patient reports significant improvement after this morphine. She reports pain being 1/10 currently. Patient also had associated shortness of breath during this episode  and has not required any oxygen supplementation. Patient has a history significant for a prior episode of chest pain in February of this year which was similar in  presentation to her current one. At that time patient had a stress test which found no indication for catheterization. Patient states that this previous episode in February was significantly worse than the current episode she is experiencing.  Heart score of 4 in the ED. In the ED patient received 4 mg of morphine. An EKG was obtained and was found to be unchanged from a previous EKG on 07/29/13. Chest x-ray was unremarkable for acute pathology. I-STAT troponins were negative. CMP showed significantly elevated creatinine of 8.38 and BUN of 56. Patient still had any dialysis needles/tubing attached at the time of our interview. IV team was called to have this removed. Patient was admitted for observation due to her chest pain. We will consult cardiology in the morning.   Review Of Systems: Per HPI  Otherwise 12 point review of systems was performed and was unremarkable.  Patient Active Problem List   Diagnosis Date Noted  . Pseudoaneurysm of arteriovenous dialysis fistula 03/16/2014  . Hyperkalemia 11/16/2013  . Acute pulmonary edema 11/16/2013  . End stage renal disease 09/23/2013  . Hypoglycemia 07/28/2013  . Hyperglycemia 07/28/2013  . Chronic constipation 07/28/2013  . Paresthesias 07/28/2013  . Shortness of breath 07/28/2013  . Chest pain, atypical 07/28/2013  . Insulin-dependent diabetes mellitus with renal complications   . 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 Chest pain 02/10/2013  . Orthostatic hypotension 02/10/2013  . ESRD on dialysis 11/24/2012  . Accelerated hypertension 11/24/2012  . IDDM (insulin dependent diabetes mellitus) 11/24/2012  . Awaiting organ transplant 09/19/2012   Past Medical History: Past Medical History  Diagnosis Date  . Hypertension   . ESRD on hemodialysis     Home HD 5x per week  . Depression   . Insulin-dependent diabetes mellitus with renal complications     INSULIN DEPENDENT  . GERD (gastroesophageal reflux disease)   . Neuromuscular disorder      NEUROPATHY  . History of blood transfusion     transfusion reaction  . Anemia    Past Surgical History: Past Surgical History  Procedure Laterality Date  . Cholecystectomy    . Abdominal hysterectomy    . Breast surgery      biopsy bilateral  . Tubal ligation    . Dialysis fistula creation Left   . Eye surgery Bilateral     lazer  . Revison of arteriovenous fistula Left 09/28/2013    Procedure: EXCISE ESCHAR LEFT ARM  ARTERIOVENOUS FISTULA WITH PLICATION OF LEFT ARM ARTERIOVENOUS FISTULA;  Surgeon: Elam Dutch, MD;  Location: [facility];  Service: Vascular;  Laterality: Left;   Social History: History  Substance Use Topics  . Smoking status: Never Smoker   . Smokeless tobacco: Never Used  . Alcohol Use: No   Additional social history: None Please also refer to relevant sections of EMR.  Family History: Family History  Problem Relation Age of Onset  . Cancer Maternal Grandmother   . Diabetes Maternal Grandfather   . Diabetes Paternal Grandmother   . Diabetes Mother   . Kidney disease Mother   . Diabetes Father   . Heart disease Father   . Kidney disease Father   . Diabetes Sister   . Asthma Sister   . Lupus Sister   . Diabetes Brother    Allergies and Medications: No Known Allergies No current facility-administered medications on  file prior to encounter.   Current Outpatient Prescriptions on File Prior to Encounter  Medication Sig Dispense Refill  . docusate sodium (COLACE) 100 MG capsule Take 100 mg by mouth 2 (two) times daily.      Marland Kitchen gabapentin (NEURONTIN) 100 MG capsule Take 100 mg by mouth at bedtime.      . Insulin Glargine (LANTUS SOLOSTAR) 100 UNIT/ML Solostar Pen Inject 10 Units into the skin at bedtime.      . multivitamin (RENA-VIT) TABS tablet Take 1 tablet by mouth daily.      . sevelamer (RENVELA) 800 MG tablet Take 3,200 mg by mouth 3 (three) times daily with meals.       . valsartan (DIOVAN) 80 MG tablet Take 80 mg by mouth daily.      Marland Kitchen VITAMIN  D, ERGOCALCIFEROL, PO Take 1 tablet by mouth daily.         Objective: BP 167/65  Pulse 74  Temp(Src) 98 F (36.7 C) (Oral)  Resp 20  Ht 5\' 1"  (1.549 m)  Wt 171 lb 14.4 oz (77.973 kg)  BMI 32.50 kg/m2  SpO2 100% Exam: General -- oriented x3, pleasant and cooperative. Primary language is spanish. HEENT -- Head is normocephalic. Eyes, pupils equal and round and react to light and accommodation. Extraocular motions are intact. Ears, nose and throat were benign. Neck -- supple; no LAD Integument -- intact. No rash or erythema  Chest -- good expansion. Lungs clear to auscultation. Cardiac -- RRR. No murmurs noted. Substantial bruit from Lt UE dialysis fistula present. Pulse pressure noted to be >100 on admission Abdomen -- soft, nontender. No masses palpable. Normal bowel sounds present. CNS -- cranial nerves II through XII grossly intact. No focal deficits Musculoskeletal - no tenderness or effusions noted. ROM good. 5/5 bilateral strength. Dorsalis pedis pulses present and symmetrical. Lt sided UE numbness present along the ulnar nerve distribution.   Labs and Imaging: CBC BMET   Recent Labs Lab 03/29/14 0118  WBC 6.4  HGB 9.1*  HCT 27.0*  PLT 159    Recent Labs Lab 03/29/14 0118  NA 133*  K 4.8  CL 92*  CO2 25  BUN 56*  CREATININE 8.38*  GLUCOSE 226*  CALCIUM 9.5     Chest x-ray IMPRESSION:  No acute cardiopulmonary process seen.   Elberta Leatherwood, MD 03/29/2014, 5:25 AM PGY-1, [place] Intern pager: [phone], text pages welcome  Upper Level Addendum:  I have seen and evaluated this patient along with Dr. Alease Frame and reviewed the above note, making necessary revisions in purple.  Nobie Putnam, [place], PGY-2

## 2014-03-29 NOTE — Progress Notes (Signed)
CRITICAL VALUE ALERT  Critical value received:  K=6.5  Date of notification:  03/29/2014  Time of notification:  0720  Critical value read back:Yes.    Nurse who received alert:  Jola Schmidt  MD notified (1st page):  FMTC via Amion  Time of first page:  0720  MD notified (2nd page):  Time of second page:  Responding MD:  [place]  Time MD responded:  [phone]

## 2014-03-29 NOTE — ED Notes (Signed)
Admitting MD at bedside.

## 2014-03-29 NOTE — ED Notes (Signed)
IV team able to deaccess dialysis unit.

## 2014-03-29 NOTE — ED Notes (Signed)
MD at bedside. 

## 2014-03-29 NOTE — Consult Note (Addendum)
Renal Service Consult Note [facility]  Kaneka Bronson 03/29/2014 Sol Blazing Requesting Physician:  Dr Andria Frames  Reason for Consult:  ESRD pt with CP HPI: The patient is a 43 y.o. year-old with hx of with HTN, ESRD, DM presenting with CP episode. She was here in Feb 2015 for similar CP and had neg trop's and neg myoview.  Pt admitted for CP r/o MI.   Patient's daughter does home HD for the pt and acts as interpreter. Pt's daughter describes CP episodes as occuring near the end of HD, while "we are pulling the last amount of fluid".  Not every Rx.  Recently EDW was raised 1 kg for hypotension at HD and this resolved the hypotension episodes. CP episodes and low BP's are not related according to daughter. Per pt , CP is a "tightness" and pressure-like, radiates into post neck, no SOB, N/V associated.  They usually "get through it" without stopping HD.  When blood is returned at end of Rx it goes away. Uniformly happens during the last 30 min or so.    Patient started HD in 2011 on [place] in [place].  Moved to [place] in 2012 and did in-center HD until Oct 2014 then switched to home HD out of preference.      Chart review: 6/14 - missed HD, HTN urgency, N/V > rx with BP meds, HD; also DM on insulin 8/14 - presyncope prob due to vol depletion after HD, improved, HTN, ESRD TTS HD at [facility] 2/15 - home HD pt with SOB, CP, paresthesias assoc with HD, high BS's also > r/o for MI, myoview neg 11/16/13 - SOB, orthop> pulm edema d/t vol overload, missed Fri HD d/t machine failure. Improved with inpt HD   ROS  no ha, visual chg  no abd pain, n/v/d  no jt pain  no SOB, cough  no rash  no confusion  Past Medical History  Past Medical History  Diagnosis Date  . Hypertension   . ESRD on hemodialysis     Home HD 5x per week  . Depression   . Insulin-dependent diabetes mellitus with renal complications     INSULIN DEPENDENT  . GERD (gastroesophageal reflux disease)   .  Neuromuscular disorder     NEUROPATHY  . History of blood transfusion     transfusion reaction  . Anemia    Past Surgical History  Past Surgical History  Procedure Laterality Date  . Cholecystectomy    . Abdominal hysterectomy    . Breast surgery      biopsy bilateral  . Tubal ligation    . Dialysis fistula creation Left   . Eye surgery Bilateral     lazer  . Revison of arteriovenous fistula Left 09/28/2013    Procedure: EXCISE ESCHAR LEFT ARM  ARTERIOVENOUS FISTULA WITH PLICATION OF LEFT ARM ARTERIOVENOUS FISTULA;  Surgeon: Elam Dutch, MD;  Location: [facility] OR;  Service: Vascular;  Laterality: Left;   Family History  Family History  Problem Relation Age of Onset  . Cancer Maternal Grandmother   . Diabetes Maternal Grandfather   . Diabetes Paternal Grandmother   . Diabetes Mother   . Kidney disease Mother   . Diabetes Father   . Heart disease Father   . Kidney disease Father   . Diabetes Sister   . Asthma Sister   . Lupus Sister   . Diabetes Brother    Social History  reports that she has never smoked. She has never used  smokeless tobacco. She reports that she does not drink alcohol or use illicit drugs. Allergies No Known Allergies Home medications Prior to Admission medications   Medication Sig Start Date End Date Taking? Authorizing Provider  docusate sodium (COLACE) 100 MG capsule Take 100 mg by mouth 2 (two) times daily.   Yes Historical Provider, MD  gabapentin (NEURONTIN) 100 MG capsule Take 100 mg by mouth at bedtime.   Yes Historical Provider, MD  Insulin Glargine (LANTUS SOLOSTAR) 100 UNIT/ML Solostar Pen Inject 10 Units into the skin at bedtime.   Yes Historical Provider, MD  multivitamin (RENA-VIT) TABS tablet Take 1 tablet by mouth daily.   Yes Historical Provider, MD  omeprazole (PRILOSEC OTC) 20 MG tablet Take 20 mg by mouth daily.   Yes Historical Provider, MD  sevelamer (RENVELA) 800 MG tablet Take 3,200 mg by mouth 3 (three) times daily with meals.     Yes Historical Provider, MD  valsartan (DIOVAN) 80 MG tablet Take 80 mg by mouth daily.   Yes Historical Provider, MD  VITAMIN D, ERGOCALCIFEROL, PO Take 1 tablet by mouth daily.    Yes Historical Provider, MD   Liver Function Tests  Recent Labs Lab 03/29/14 0118  AST 12  ALT 12  ALKPHOS 120*  BILITOT 0.2*  PROT 7.2  ALBUMIN 3.6   No results found for this basename: LIPASE, AMYLASE,  in the last 168 hours CBC  Recent Labs Lab 03/23/14 0653 03/29/14 0118  WBC  --  6.4  HGB 9.2* 9.1*  HCT 27.0* 27.0*  MCV  --  95.7  PLT  --  [id number]   Basic Metabolic Panel  Recent Labs Lab 03/23/14 0653 03/29/14 0118 03/29/14 0616  NA 135* 133* 131*  K 4.7 4.8 6.5*  CL  --  92* 93*  CO2  --  25 25  GLUCOSE 122* 226* 172*  BUN  --  56* 58*  CREATININE  --  8.38* 9.35*  CALCIUM  --  9.5 9.2  PHOS  --   --  4.7*    Filed Vitals:   03/29/14 0400 03/29/14 0430 03/29/14 0450 03/29/14 0457  BP: 168/69 158/61  167/65  Pulse: 83 79  74  Temp:    98 F (36.7 C)  TempSrc:    Oral  Resp: 21 22  20   Height:   5\' 1"  (1.549 m)   Weight:   77.973 kg (171 lb 14.4 oz)   SpO2: 99% 99%  100%   Exam Alert, no distress No rash, cyanosis or gangrene Sclera anicteric, throat clear No jvd Chest clear bilat RRR no MRG, transmitted bruit from AVF Abd soft, NTND, no mass or ascites Ext no LE edema Neuro is nf, Ox 3 LUA fistula patent, no signs of infection  HD: Home HD 5 days per week, 3 hours, per HD, dry wt 76kg,  Heparin 2000, BFR 400 EPO 8000 weekly    Assessment: 1 Recurrent chest pain - occurs mostly on dialysis, towards end of Rx. Cardiology evaluating, had negative stress test in February. Is to have CT angio of coronaries today per cardiology. 2 ESRD on HD since 2011, doing home HD since Oct 2014 3 DM longstanding 4 HTN cont valsartan 5 HPTH cont renvela 4ac 6 Anemia cont epo 7 Volume no vol excess, up 2kg by wt  Plan- HD today, cont binders, esa  Kelly Splinter MD (pgr)  [phone]    ([phone] 03/29/2014, 9:35 AM

## 2014-03-29 NOTE — Procedures (Signed)
I was present at this dialysis session, have reviewed the session itself and made  appropriate changes  Kelly Splinter MD (pgr) [phone]    ([phone] 03/29/2014, 1:50 PM

## 2014-03-29 NOTE — ED Provider Notes (Signed)
CSN: [id number]     Arrival date & time 03/29/14  0049 History   First MD Initiated Contact with Patient 03/29/14 0108     Chief Complaint  Patient presents with  . Chest Pain     (Consider location/radiation/quality/duration/timing/severity/associated sxs/prior Treatment) HPI  43 y.o. female presenting with acute onset chest pain during dialysis treatment. PMH is significant for end-stage renal disease on dialysis, hypertension, insulin-dependent diabetes, atypical chest pain, left upper extremity dialysis fistula, paresthesias. Describes pain as both pressure and sharp. Associated L arm numbness and also in L neck/shulder. Nausea. No diaphoresis. No cough. NO fever or chills. No unusual leg pain or swelling.    Past Medical History  Diagnosis Date  . Hypertension   . ESRD on hemodialysis     Home HD 5x per week  . Depression   . Insulin-dependent diabetes mellitus with renal complications     INSULIN DEPENDENT  . GERD (gastroesophageal reflux disease)   . Neuromuscular disorder     NEUROPATHY  . History of blood transfusion     transfusion reaction  . Anemia    Past Surgical History  Procedure Laterality Date  . Cholecystectomy    . Abdominal hysterectomy    . Breast surgery      biopsy bilateral  . Tubal ligation    . Dialysis fistula creation Left   . Eye surgery Bilateral     lazer  . Revison of arteriovenous fistula Left 09/28/2013    Procedure: EXCISE ESCHAR LEFT ARM  ARTERIOVENOUS FISTULA WITH PLICATION OF LEFT ARM ARTERIOVENOUS FISTULA;  Surgeon: Elam Dutch, MD;  Location: [facility] OR;  Service: Vascular;  Laterality: Left;   Family History  Problem Relation Age of Onset  . Cancer Maternal Grandmother   . Diabetes Maternal Grandfather   . Diabetes Paternal Grandmother   . Diabetes Mother   . Kidney disease Mother   . Diabetes Father   . Heart disease Father   . Kidney disease Father   . Diabetes Sister   . Asthma Sister   . Lupus Sister   . Diabetes  Brother    History  Substance Use Topics  . Smoking status: Never Smoker   . Smokeless tobacco: Never Used  . Alcohol Use: No   OB History   Grav Para Term Preterm Abortions TAB SAB Ect Mult Living                 Review of Systems  All systems reviewed and negative, other than as noted in HPI.   Allergies  Review of patient's allergies indicates no known allergies.  Home Medications   Prior to Admission medications   Medication Sig Start Date End Date Taking? Authorizing Provider  docusate sodium (COLACE) 100 MG capsule Take 100 mg by mouth 2 (two) times daily.   Yes Historical Provider, MD  gabapentin (NEURONTIN) 100 MG capsule Take 100 mg by mouth at bedtime.   Yes Historical Provider, MD  Insulin Glargine (LANTUS SOLOSTAR) 100 UNIT/ML Solostar Pen Inject 10 Units into the skin at bedtime.   Yes Historical Provider, MD  multivitamin (RENA-VIT) TABS tablet Take 1 tablet by mouth daily.   Yes Historical Provider, MD  omeprazole (PRILOSEC OTC) 20 MG tablet Take 20 mg by mouth daily.   Yes Historical Provider, MD  sevelamer (RENVELA) 800 MG tablet Take 3,200 mg by mouth 3 (three) times daily with meals.    Yes Historical Provider, MD  valsartan (DIOVAN) 80 MG tablet Take 80 mg by  mouth daily.   Yes Historical Provider, MD  VITAMIN D, ERGOCALCIFEROL, PO Take 1 tablet by mouth daily.    Yes Historical Provider, MD   BP 159/67  Pulse 79  Temp(Src) 98.1 F (36.7 C) (Oral)  Resp 11  Ht 5\' 1"  (1.549 m)  Wt 176 lb 5.9 oz (80 kg)  BMI 33.34 kg/m2  SpO2 100% Physical Exam  Nursing note and vitals reviewed. Constitutional: She appears well-developed and well-nourished. No distress.  HENT:  Head: Normocephalic and atraumatic.  Eyes: Conjunctivae are normal. Right eye exhibits no discharge. Left eye exhibits no discharge.  Neck: Neck supple.  Cardiovascular: Normal rate, regular rhythm and normal heart sounds.  Exam reveals no gallop and no friction rub.   No murmur  heard. Pulmonary/Chest: Effort normal and breath sounds normal. No respiratory distress. She exhibits no tenderness.  Abdominal: Soft. She exhibits no distension. There is no tenderness.  Musculoskeletal: She exhibits no edema and no tenderness.  Lower extremities symmetric as compared to each other. No calf tenderness. Negative Homan's. No palpable cords.   Neurological: She is alert.  Skin: Skin is warm and dry.  Psychiatric: She has a normal mood and affect. Her behavior is normal. Thought content normal.    ED Course  Procedures (including critical care time) Labs Review Labs Reviewed  CBC - Abnormal; Notable for the following:    RBC 2.82 (*)    Hemoglobin 9.1 (*)    HCT 27.0 (*)    All other components within normal limits  COMPREHENSIVE METABOLIC PANEL - Abnormal; Notable for the following:    Sodium 133 (*)    Chloride 92 (*)    Glucose, Bld 226 (*)    BUN 56 (*)    Creatinine, Ser 8.38 (*)    Alkaline Phosphatase 120 (*)    Total Bilirubin 0.2 (*)    GFR calc non Af Amer 5 (*)    GFR calc Af Amer 6 (*)    Anion gap 16 (*)    All other components within normal limits  I-STAT TROPOININ, ED    Imaging Review Dg Chest Portable 1 View  03/29/2014   CLINICAL DATA:  Acute onset of sharp central chest pain, with associated chest pressure. Left arm numbness and tingling. Symptoms started tonight during dialysis. Initial encounter.  EXAM: PORTABLE CHEST - 1 VIEW  COMPARISON:  Chest radiograph performed 12/31/2013  FINDINGS: The lungs are well-aerated and clear. There is no evidence of focal opacification, pleural effusion or pneumothorax.  The cardiomediastinal silhouette is within normal limits. No acute osseous abnormalities are seen.  IMPRESSION: No acute cardiopulmonary process seen.   Electronically Signed   By: Garald Balding M.D.   On: 03/29/2014 02:06     EKG Interpretation   Date/Time:  Monday March 29 2014 01:00:09 EDT Ventricular Rate:  81 PR Interval:   157 QRS Duration: 97 QT Interval:  414 QTC Calculation: 481 R Axis:   -60 Text Interpretation:  Sinus rhythm Left anterior fascicular block ST elev  v2 Non-specific ST-t changes Confirmed by Wilson Singer  MD, Flor Houdeshell ([id number]) on  03/29/2014 1:46:52 AM      MDM   Final diagnoses:  Chest pain, unspecified chest pain type    43yF with CP. Lexiscan nuclear study from February 2015 with no chest pain and no ST changes; images show probable breast attenuation and no ischemia; gated EF 62% and wall motion normal. Symptoms she is describing are somewhat concerning though. Will admit for r/o.  Virgel Manifold, MD 04/07/14 [phone]

## 2014-03-29 NOTE — ED Notes (Signed)
Pt is from home, EMS reports pt is a dialysis pt, pt perform hemodialysis at home 5X/week. Pt started dialysis tonight at home, about 40 minutes into treatment pt reports central chest pain that she describes is sharp with pressure. PT reports left arm numbness and tingling and nausea that started after the chest pain. EMS adm 32 asprin, 4mg  of zofran, and 3 nitroglycerin. Pt's pain is now an 8/10.

## 2014-03-29 NOTE — Progress Notes (Signed)
UR completed 

## 2014-03-29 NOTE — ED Notes (Signed)
Spoke with 6E about deaccessing pt's dialysis cath, informed to call IV team. Spoke with IV team, will come and see if they are able to deaccess the unit.

## 2014-03-30 ENCOUNTER — Encounter (HOSPITAL_COMMUNITY)
Admission: EM | Disposition: A | Discharge status: Home / Self Care | Payer: Self-pay | Source: Home / Self Care | Attending: Emergency Medicine

## 2014-03-30 DIAGNOSIS — N186 End stage renal disease: Secondary | ICD-10-CM | POA: Diagnosis not present

## 2014-03-30 DIAGNOSIS — R079 Chest pain, unspecified: Secondary | ICD-10-CM

## 2014-03-30 DIAGNOSIS — R0789 Other chest pain: Secondary | ICD-10-CM | POA: Diagnosis not present

## 2014-03-30 DIAGNOSIS — I10 Essential (primary) hypertension: Secondary | ICD-10-CM | POA: Diagnosis not present

## 2014-03-30 DIAGNOSIS — E1129 Type 2 diabetes mellitus with other diabetic kidney complication: Secondary | ICD-10-CM | POA: Diagnosis not present

## 2014-03-30 HISTORY — PX: LEFT HEART CATHETERIZATION WITH CORONARY ANGIOGRAM: SHX5451

## 2014-03-30 LAB — RENAL FUNCTION PANEL
ALBUMIN: 3.1 g/dL — AB (ref 3.5–5.2)
Anion gap: 14 (ref 5–15)
BUN: 28 mg/dL — AB (ref 6–23)
CHLORIDE: 97 meq/L (ref 96–112)
CO2: 28 meq/L (ref 19–32)
Calcium: 8.6 mg/dL (ref 8.4–10.5)
Creatinine, Ser: 6.88 mg/dL — ABNORMAL HIGH (ref 0.50–1.10)
GFR calc Af Amer: 8 mL/min — ABNORMAL LOW (ref >90)
GFR calc non Af Amer: 7 mL/min — ABNORMAL LOW (ref >90)
Glucose, Bld: 112 mg/dL — ABNORMAL HIGH (ref 70–99)
POTASSIUM: 5.1 meq/L (ref 3.7–5.3)
Phosphorus: 5.6 mg/dL — ABNORMAL HIGH (ref 2.3–4.6)
Sodium: 139 mEq/L (ref 137–147)

## 2014-03-30 LAB — GLUCOSE, CAPILLARY
Glucose-Capillary: 113 mg/dL — ABNORMAL HIGH (ref 70–99)
Glucose-Capillary: 117 mg/dL — ABNORMAL HIGH (ref 70–99)
Glucose-Capillary: 86 mg/dL (ref 70–99)
Glucose-Capillary: 151 mg/dL — ABNORMAL HIGH (ref 70–99)

## 2014-03-30 LAB — PROTIME-INR
INR: 1.03 (ref 0.00–1.49)
Prothrombin Time: 13.5 seconds (ref 11.6–15.2)

## 2014-03-30 SURGERY — LEFT HEART CATHETERIZATION WITH CORONARY ANGIOGRAM
Anesthesia: LOCAL

## 2014-03-30 MED ORDER — HEPARIN (PORCINE) IN NACL 2-0.9 UNIT/ML-% IJ SOLN
INTRAMUSCULAR | Status: AC
Start: 2014-03-30 — End: 2014-03-30
  Filled 2014-03-30: qty 1000

## 2014-03-30 MED ORDER — FENTANYL CITRATE 0.05 MG/ML IJ SOLN
INTRAMUSCULAR | Status: AC
  Filled 2014-03-30: qty 2

## 2014-03-30 MED ORDER — SODIUM CHLORIDE 0.9 % IJ SOLN: 3.0000 mL | INTRAMUSCULAR | Status: DC | PRN

## 2014-03-30 MED ORDER — SODIUM CHLORIDE 0.9 % IJ SOLN
3.0000 mL | Freq: Two times a day (BID) | INTRAMUSCULAR | Status: DC
  Administered 2014-03-30: 3 mL via INTRAVENOUS

## 2014-03-30 MED ORDER — LIDOCAINE HCL (PF) 1 % IJ SOLN
INTRAMUSCULAR | Status: AC
  Filled 2014-03-30: qty 30

## 2014-03-30 MED ORDER — MIDAZOLAM HCL 2 MG/2ML IJ SOLN
INTRAMUSCULAR | Status: AC
  Filled 2014-03-30: qty 2

## 2014-03-30 MED ORDER — SODIUM CHLORIDE 0.9 % IV SOLN: 250.0000 mL | INTRAVENOUS | Status: DC | PRN

## 2014-03-30 MED ORDER — NITROGLYCERIN 1 MG/10 ML FOR IR/CATH LAB
INTRA_ARTERIAL | Status: AC
  Filled 2014-03-30: qty 10

## 2014-03-30 MED ORDER — HYDRALAZINE HCL 20 MG/ML IJ SOLN: 10.0000 mg | Freq: Four times a day (QID) | INTRAMUSCULAR | Status: DC | PRN

## 2014-03-30 MED ORDER — SODIUM CHLORIDE 0.9 % IJ SOLN: 3.0000 mL | Freq: Two times a day (BID) | INTRAMUSCULAR | Status: DC

## 2014-03-30 NOTE — Progress Notes (Signed)
Patient ID: Cynthia [name], female   DOB: [birth year], 43 y.o.   MRN: [id number] Family Medicine Teaching Service Daily Progress Note Intern Pager: [phone]  Patient name: Cynthia [name] Medical record number: [id number] Date of birth: 1971/03/05 Age: 43 y.o. Gender: female  Primary Care Provider: Rexene Agent, MD Consultants: Cardiology, Nephrology Code Status: Full  Pt Overview and Major Events to Date:  10/5: Admitted for CP Obs and ACS r/o  Assessment and Plan: Cynthia [name] is a 43 y.o. female presenting with acute onset chest pain during dialysis treatment. PMH is significant for end-stage renal disease on dialysis, hypertension, insulin-dependent diabetes, atypical chest pain, left upper extremity dialysis fistula, paresthesias.   #Chest pain, acute - heart score of 4 on admission; Troponins negative, CXR negative. Patient currently well-appearing, chest pain free and asymptomatic, however concern for ACS given heart score, CAD equivalent with DM, and hx with typical features, plan to rule out MI. Recent stress test (lexiscan without ischemia, EF 62%). - Cardiology consulted; appreciate recs - patient will be getting echo  -Coronary CT performed - please see impression below. Patient will receive cardiac cath today. - Colace for constipation secondary to morphine use  - Aspirin 81 mg daily; nitroglycerin 0.4 mg when necessary  - Morphine 2 mg every 2 hours when necessary  - Protonix 40 mg daily  - Zofran 4 mg every 6 hours when necessary for nausea   #End stage renal disease - patient is on home dialysis 5 times per week. 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 Suspect some of patient's symptoms with fatigue and DOE may be related to chronic anemia.  - Continue to monitor  with CBC  -continue epo  #Hypertension - Initially elevated in ED to 170/70, since improved. - Irbesartan 300 mg daily  -metoprolol BID started by cards  #Hyperlipidemia -  -ASCVD score 6.2% 10-yr risk/50% lifetime risk -Atorvastatin 10mg  daily started by cardiology  #Insulin-dependent diabetes -  A1c 6.8. CBGs ranging 140s-180s - Lantus 10 units daily (home regimen)  - Sliding scale insulin; sensitive scale   #Paresthesias  - Gabapentin 100 mg daily   #Anxiety  - Ativan 1 mg every 6 hours when necessary   FEN/GI: No IV fluids at this time due to hemodialysis patient; NPO Prophylaxis: Heparin subcutaneous  Disposition: Discharge pending work-up by cardiology team. Possible home tomorrow.  Subjective: Patient doing well. She has no complaints. Denies any more CP.  Objective: Temp:  [97.1 F (36.2 C)-98.4 F (36.9 C)] 98.4 F (36.9 C) (10/06 0504) Pulse Rate:  [70-80] 75 (10/06 0504) Resp:  [9-18] 18 (10/06 0504) BP: (93-159)/(50-83) 121/53 mmHg (10/06 0504) SpO2:  [97 %-98 %] 98 % (10/06 0504) Weight:  [156 lb 8.4 oz (71 kg)-170 lb 12.8 oz (77.474 kg)] 170 lb 12.8 oz (77.474 kg) (10/06 0504)  Physical Exam: General -- oriented x3, pleasant and cooperative. Primary language is spanish.  HEENT -- NCAT, MMM, EOMI Neck -- supple; no LAD  Integument -- intact. No rash or erythema  Chest -- good expansion. Lungs clear to auscultation.  Cardiac -- RRR. No murmurs noted. Substantial bruit from Lt UE dialysis fistula present.  Abdomen -- soft, nontender. No masses palpable. Normal bowel sounds present.  CNS -- cranial nerves II through XII grossly intact. No focal deficits  Musculoskeletal - no tenderness  or effusions noted. ROM good. 5/5 bilateral strength. Dorsalis pedis pulses present and symmetrical.   Laboratory:  Recent Labs Lab 03/29/14 0118  WBC 6.4  HGB 9.1*  HCT 27.0*  PLT 159    Recent Labs Lab 03/29/14 0118 03/29/14 0616 03/29/14 1727 03/30/14 0513   NA 133* 131* 136* 139  K 4.8 6.5* 5.2 5.1  CL 92* 93* 95* 97  CO2 25 25 26 28   BUN 56* 58* 19 28*  CREATININE 8.38* 9.35* 4.37* 6.88*  CALCIUM 9.5 9.2 8.9 8.6  PROT 7.2  --   --   --   BILITOT 0.2*  --   --   --   ALKPHOS 120*  --   --   --   ALT 12  --   --   --   AST 12  --   --   --   GLUCOSE 226* 172* 140* 112*   Lipid Panel     Component Value Date/Time   CHOL 162 03/29/2014 0616   TRIG 284* 03/29/2014 0616   HDL 31* 03/29/2014 0616   CHOLHDL 5.2 03/29/2014 0616   VLDL 57* 03/29/2014 0616   LDLCALC 74 03/29/2014 0616   TSH 5.8 Trops neg x3 A1c 6.8 Phos 4.7(H) Mg 2.5  Imaging/Diagnostic Tests: Dg Chest Portable 1 View 03/29/2014  IMPRESSION: No acute cardiopulmonary process seen.     Ct Coronary Morp W/cta Cor W/score W/ca W/cm &/or Wo/cm 03/30/2014  IMPRESSION: 1. Coronary calcium score of 149 all located in proximal LAD. This was 64 percentile for age and sex matched control.  2. Normal origin or coronary arteries.  Right dominance.  3. Significant calcification of the ostial and proximal LAD with possible significant stenosis. Considering that the patient is being worked up for kidney transplant she should undergo cardiac catheterization.   Katheren Shams, DO 03/30/2014, 7:29 AM PGY-1, [place] Intern pager: [phone], text pages welcome

## 2014-03-30 NOTE — Progress Notes (Signed)
Subjective:  Chest pain around 5 AM this morning, resolved, but NPO for pending cardiac cath  Objective: Vital signs in last 24 hours: Temp:  [97.1 F (36.2 C)-98.4 F (36.9 C)] 98.4 F (36.9 C) (10/06 0504) Pulse Rate:  [70-80] 71 (10/06 1012) Resp:  [9-18] 18 (10/06 0504) BP: (93-159)/(50-83) 150/65 mmHg (10/06 1012) SpO2:  [97 %-98 %] 98 % (10/06 0504) Weight:  [71 kg (156 lb 8.4 oz)-77.474 kg (170 lb 12.8 oz)] 77.474 kg (170 lb 12.8 oz) (10/06 0504) Weight change: -2.8 kg (-6 lb 2.8 oz)  Intake/Output from previous day: 10/05 0701 - 10/06 0700 In: -  Out: 1082  Intake/Output this shift:   Lab Results:  Recent Labs  03/29/14 0118  WBC 6.4  HGB 9.1*  HCT 27.0*  PLT 159   BMET:  Recent Labs  03/29/14 0118  03/29/14 1727 03/30/14 0513  NA 133*  < > 136* 139  K 4.8  < > 5.2 5.1  CL 92*  < > 95* 97  CO2 25  < > 26 28  GLUCOSE 226*  < > 140* 112*  BUN 56*  < > 19 28*  CREATININE 8.38*  < > 4.37* 6.88*  CALCIUM 9.5  < > 8.9 8.6  ALBUMIN 3.6  --   --  3.1*  < > = values in this interval not displayed. No results found for this basename: PTH,  in the last 72 hours Iron Studies: No results found for this basename: IRON, TIBC, TRANSFERRIN, FERRITIN,  in the last 72 hours  Studies/Results: Ct Coronary Morp W/cta Cor W/score W/ca W/cm &/or Wo/cm  03/30/2014   CLINICAL DATA:  43 year old female with HTN, DM and ESRD presenting with chest pain  EXAM: Cardiac / Coronary  CT  TECHNIQUE: The patient was scanned on a Philips 256 scanner.  FINDINGS: A 120 kV prospective scan was triggered in the descending thoracic aorta at 111 HU's. Axial non-contrast 75mm slices were carried out through the heart. The data set was analyzed on a dedicated work station and scored using the [place]. Gantry rotation speed was 270 msecs and collimation was .9 mm. No beta blockade and 0.4 mg of sl NTG was given. The 3D data set was reconstructed in 5% intervals of the 67-82 % of the R-R cycle.  Diastolic phases were analyzed on a dedicated work station using MPR, MIP and VRT modes. The patient received 80 cc of contrast.  Aorta: Normal size of the aortic root and ascending aorta and arch. No dissection, mild calcifications in the aortic arch.  Aortic Valve:  Trileaflet, no calcifications.  Coronary Arteries: Originating in a normal position. Right dominance.  Left main is a large vessel that gives rise to LAD and LCX. There is mild calcified plaque at the distal bifurcation associated with 0-25% stenosis.  LAD is a large caliber vessel that gives rise to a large diagonal branch. Ostial and proximal LAD has significant amount of calcified plaque. Unfortunately this segment is affected by motion, but there is possibly hemodynamically significant ostial stenosis. Mid and distal LAD appear normal.  Diagonal branch further subbranches and doesn't have plaque.  LCX is a medium size non-dominant vessel that gives rise to two obtuse marginal branches. There is mild non-calcified plaque in the proximal LCX associated with 0-25% stenosis, the rest of LCX and OMs appear normal.  RCA is a large dominant vessel that gives rise to PDA and PLVB. RCA is affected by motion [facility]) in its  mid portion, however appears normal without plaque.  IMPRESSION: 1. Coronary calcium score of 149 all located in proximal LAD. This was 42 percentile for age and sex matched control.  2. Normal origin or coronary arteries.  Right dominance.  3. Significant calcification of the ostial and proximal LAD with possible significant stenosis. Considering that the patient is being worked up for kidney transplant she should undergo cardiac catheterization.  [name]   Electronically Signed   By: [name]   On: 03/30/2014 08:05   Dg Chest Portable 1 View  03/29/2014   CLINICAL DATA:  Acute onset of sharp central chest pain, with associated chest pressure. Left arm numbness and tingling. Symptoms started tonight during dialysis.  Initial encounter.  EXAM: PORTABLE CHEST - 1 VIEW  COMPARISON:  Chest radiograph performed 12/31/2013  FINDINGS: The lungs are well-aerated and clear. There is no evidence of focal opacification, pleural effusion or pneumothorax.  The cardiomediastinal silhouette is within normal limits. No acute osseous abnormalities are seen.  IMPRESSION: No acute cardiopulmonary process seen.   Electronically Signed   By: Garald Balding M.D.   On: 03/29/2014 02:06   EXAM: General appearance:  Alert, in no apparent distress Resp:  CTA without rales, rhonchi, or wheezes Cardio:  RRR without murmur or rub GI:  + BS, soft and nontender Extremities:  No edema Access:  AVF @ LUA with + bruit  HD: Home HD 5 days per week, 3 hours, per HD, dry wt 76kg, Heparin 2000, BFR 400  EPO 8000 weekly   Assessment/Plan: 1. Recurrent chest pain - occurs near end of HD, but also this AM; coronary CT 10/5 showed significant calcification of ostial and prox LAD with possible significant stenosis; cardiac cath pending today. 2. ESRD - HD since 2011, home HD since 03/2013.  Next HD tomorrow. 3. HTN/Volume - BP 150/65 on Irbesartan 300 mg qd, Metoprolol 50 mg bid; wt 77.4 kg s/p net UF 1.1 L yesterday. 4. Anemia - Hgb 9.1, aranesp 60 mcg on Wed. 5. Sec HPT - Ca 8.6 (9.3 corrected), P 5.6; Renvela 4 with meals. 6. Nutrition - Alb 3.1, currently NPO, multivitamin. 7. DM - insulin per primary.    LOS: 1 day   LYLES,CHARLES 03/30/2014,11:50 AM  Pt seen, examined and agree w A/P as above. HD tomorrow, cath today.  Kelly Splinter MD pager [phone]    cell [phone] 03/30/2014, 2:40 PM

## 2014-03-30 NOTE — CV Procedure (Signed)
     Left Heart Catheterization with Coronary Angiography  Report  Cynthia [name]  43 y.o.  female Feb 18, 1971  Procedure Date: 03/30/2014 Referring Physician: [facility] Primary Cardiologist: Lilian Kapur, M.D.  INDICATIONS: Anginal quality chest pain during dialysis in a 43 year old diabetic of 30 years duration.  PROCEDURE: 1. Left heart catheterization; 2. Coronary angiography; 3. Left ventriculography  CONSENT:  The risks, benefits, and details of the procedure were explained in detail to the patient. Risks including death, stroke, heart attack, kidney injury, allergy, limb ischemia, bleeding and radiation injury were discussed.  The patient verbalized understanding and wanted to proceed.  Informed written consent was obtained.  PROCEDURE TECHNIQUE:  After Xylocaine anesthesia a 5 French sheath was placed in the right femoral artery using the modified Seldinger technique.  Coronary angiography was done using a 5 F A2 MP catheter.  Left ventriculography was done using the A2 MP 5 French catheter and hand injection.   Digital images were reviewed and the case was terminated.  Hemostasis was achieved with an Angio-Seal closure device with good results.   CONTRAST:  Total of 75 cc.  COMPLICATIONS:  None   HEMODYNAMICS:  Aortic pressure 117/51 mmHg; LV pressure 121/5 mmHg; LVEDP 8 mm mercury  ANGIOGRAPHIC DATA:   The left main coronary artery is normal.  The left anterior descending artery is large and reaches the left ventricular apex. It gives origin to a large diagonal. The proximal LAD has moderate calcification. No obstructive lesions are noted..  The left circumflex artery is widely patent. 4 obtuse marginal branches arise from the circumflex and are normal.  The right coronary artery is dominant and normal..  PCI RESULTS: Not required  LEFT VENTRICULOGRAM:  Left ventricular angiogram was done in the 30 RAO projection and revealed normal cavity size  and contractility. Estimated ejection fraction 65%.   IMPRESSIONS:  1. Widely patent epicardial coronary arteries. 2. Normal left ventricular size, systolic function (EF [id number]), and hemodynamics. 3. Proximal LAD calcification   RECOMMENDATION:  Risk factor modification issue of doing. For the evaluation as is clinically appropriate.

## 2014-03-30 NOTE — Progress Notes (Signed)
Patient Name: Cynthia [name] Date of Encounter: 03/30/2014     Active Problems:   Chest pain    SUBJECTIVE  1 episode of chest pain around 5 am, pressure like sensation, resolved. Currently no CP or SOB  CURRENT MEDS . aspirin EC  81 mg Oral Daily  . atorvastatin  10 mg Oral q1800  . calcium gluconate  1 g Intravenous Once  . [START ON 03/31/2014] darbepoetin  60 mcg Intravenous Q Wed-HD  . docusate sodium  100 mg Oral BID  . gabapentin  100 mg Oral QHS  . heparin  5,000 Units Subcutaneous 3 times per day  . insulin aspart  0-9 Units Subcutaneous TID WC  . insulin glargine  10 Units Subcutaneous Daily  . irbesartan  300 mg Oral Daily  . metoprolol tartrate  50 mg Oral BID  . multivitamin  1 tablet Oral QHS  . pantoprazole  40 mg Oral Daily  . sevelamer carbonate  3,200 mg Oral TID WC    OBJECTIVE  Filed Vitals:   03/29/14 1548 03/29/14 1729 03/29/14 2130 03/30/14 0504  BP: 133/67 147/63 128/53 121/53  Pulse: 75 80 74 75  Temp: 97.1 F (36.2 C)  97.6 F (36.4 C) 98.4 F (36.9 C)  TempSrc: Oral  Oral Oral  Resp: 14  18 18   Height:      Weight: 156 lb 8.4 oz (71 kg)   170 lb 12.8 oz (77.474 kg)  SpO2:   97% 98%    Intake/Output Summary (Last 24 hours) at 03/30/14 0857 Last data filed at 03/29/14 1548  Gross per 24 hour  Intake      0 ml  Output   1082 ml  Net  -1082 ml   Filed Weights   03/29/14 1234 03/29/14 1548 03/30/14 0504  Weight: 170 lb 3.1 oz (77.2 kg) 156 lb 8.4 oz (71 kg) 170 lb 12.8 oz (77.474 kg)    PHYSICAL EXAM  General: Pleasant, NAD. Neuro: Alert and oriented X 3. Moves all extremities spontaneously. Psych: Normal affect. HEENT:  Normal  Neck: Supple without bruits or JVD. Lungs:  Resp regular and unlabored, CTA. Heart: RRR no s3, s4. 3/6 holosystolic vs machine like murmur near L upper sternal border Abdomen: Soft, non-tender, non-distended, BS + x 4.  Extremities: No clubbing, cyanosis or edema. DP/PT/Radials 2+ and equal  bilaterally. LUE AVF present  Accessory Clinical Findings  CBC  Recent Labs  03/29/14 0118  WBC 6.4  HGB 9.1*  HCT 27.0*  MCV 95.7  PLT [id number]   Basic Metabolic Panel  Recent Labs  03/29/14 0616 03/29/14 1727 03/30/14 0513  NA 131* 136* 139  K 6.5* 5.2 5.1  CL 93* 95* 97  CO2 25 26 28   GLUCOSE 172* 140* 112*  BUN 58* 19 28*  CREATININE 9.35* 4.37* 6.88*  CALCIUM 9.2 8.9 8.6  MG 2.5  --   --   PHOS 4.7*  --  5.6*   Liver Function Tests  Recent Labs  03/29/14 0118 03/30/14 0513  AST 12  --   ALT 12  --   ALKPHOS 120*  --   BILITOT 0.2*  --   PROT 7.2  --   ALBUMIN 3.6 3.1*   Cardiac Enzymes  Recent Labs  03/29/14 0616 03/29/14 1104 03/29/14 1727  TROPONINI <0.30 <0.30 <0.30   Hemoglobin A1C  Recent Labs  03/29/14 0616  HGBA1C 6.8*   Fasting Lipid Panel  Recent Labs  03/29/14 0616  CHOL 162  HDL 31*  LDLCALC 74  TRIG 284*  CHOLHDL 5.2   Thyroid Function Tests  Recent Labs  03/29/14 0530  TSH 5.800*    TELE NSR with HR 60-70s, no significant ventricular ectopy    ECG  NSR with pulmonary disease pattern    Radiology/Studies  Ct Coronary Morp W/cta Cor W/score W/ca W/cm &/or Wo/cm  03/30/2014   CLINICAL DATA:  43 year old female with HTN, DM and ESRD presenting with chest pain  EXAM: Cardiac / Coronary  CT  TECHNIQUE: The patient was scanned on a Philips 256 scanner.  FINDINGS: A 120 kV prospective scan was triggered in the descending thoracic aorta at 111 HU's. Axial non-contrast 21mm slices were carried out through the heart. The data set was analyzed on a dedicated work station and scored using the [place]. Gantry rotation speed was 270 msecs and collimation was .9 mm. No beta blockade and 0.4 mg of sl NTG was given. The 3D data set was reconstructed in 5% intervals of the 67-82 % of the R-R cycle. Diastolic phases were analyzed on a dedicated work station using MPR, MIP and VRT modes. The patient received 80 cc of  contrast.  Aorta: Normal size of the aortic root and ascending aorta and arch. No dissection, mild calcifications in the aortic arch.  Aortic Valve:  Trileaflet, no calcifications.  Coronary Arteries: Originating in a normal position. Right dominance.  Left main is a large vessel that gives rise to LAD and LCX. There is mild calcified plaque at the distal bifurcation associated with 0-25% stenosis.  LAD is a large caliber vessel that gives rise to a large diagonal branch. Ostial and proximal LAD has significant amount of calcified plaque. Unfortunately this segment is affected by motion, but there is possibly hemodynamically significant ostial stenosis. Mid and distal LAD appear normal.  Diagonal branch further subbranches and doesn't have plaque.  LCX is a medium size non-dominant vessel that gives rise to two obtuse marginal branches. There is mild non-calcified plaque in the proximal LCX associated with 0-25% stenosis, the rest of LCX and OMs appear normal.  RCA is a large dominant vessel that gives rise to PDA and PLVB. RCA is affected by motion [facility]) in its mid portion, however appears normal without plaque.  IMPRESSION: 1. Coronary calcium score of 149 all located in proximal LAD. This was 65 percentile for age and sex matched control.  2. Normal origin or coronary arteries.  Right dominance.  3. Significant calcification of the ostial and proximal LAD with possible significant stenosis. Considering that the patient is being worked up for kidney transplant she should undergo cardiac catheterization.  Cynthia [name]   Electronically Signed   By: Cynthia [name]   On: 03/30/2014 08:05   Dg Chest Portable 1 View  03/29/2014   CLINICAL DATA:  Acute onset of sharp central chest pain, with associated chest pressure. Left arm numbness and tingling. Symptoms started tonight during dialysis. Initial encounter.  EXAM: PORTABLE CHEST - 1 VIEW  COMPARISON:  Chest radiograph performed 12/31/2013  FINDINGS: The lungs  are well-aerated and clear. There is no evidence of focal opacification, pleural effusion or pneumothorax.  The cardiomediastinal silhouette is within normal limits. No acute osseous abnormalities are seen.  IMPRESSION: No acute cardiopulmonary process seen.   Electronically Signed   By: Garald Balding M.D.   On: 03/29/2014 02:06   Dg Humerus Left  03/23/2014   CLINICAL DATA:  Shuntogram.  EXAM: DG C-ARM 61-120 MIN; LEFT  HUMERUS - 2+ VIEW  COMPARISON:  None.  FINDINGS: Multiple images are submitted from a left upper arm fistulogram. These demonstrate mild focal stenosis within the central cephalic vein. Imaging demonstrates balloon dilatation of this area with improved appearance post dilatation. Slight residual stenosis common likely not hemodynamically significant.  IMPRESSION: Left upper extremity av fistulagram as above.   Electronically Signed   By: Rolm Baptise M.D.   On: 03/23/2014 16:14   Dg C-arm 1-60 Min  03/23/2014   CLINICAL DATA:  Shuntogram.  EXAM: DG C-ARM 61-120 MIN; LEFT HUMERUS - 2+ VIEW  COMPARISON:  None.  FINDINGS: Multiple images are submitted from a left upper arm fistulogram. These demonstrate mild focal stenosis within the central cephalic vein. Imaging demonstrates balloon dilatation of this area with improved appearance post dilatation. Slight residual stenosis common likely not hemodynamically significant.  IMPRESSION: Left upper extremity av fistulagram as above.   Electronically Signed   By: Rolm Baptise M.D.   On: 03/23/2014 16:14    ASSESSMENT AND PLAN  1. Chest pain during dialysis  - negative lexiscan with EF 62% in Feb 2015 for similar CP  - coronary CT 03/29/2014 calcium score 149 all located in prox LAD, 99th percentile, significant calcification of ostial and prox LAD with possible significant stenosis - consider cath as patient is being worked up for kidney transplant  - discussed with patient regarding benefit and risk of cardiac cath include vascular injury,  bleeding, arrythmia, MI or stroke with help of spanish translation by nursing staff, patient display clear understanding and agree to proceed. Will make NPO and plan for cardiac cath. Since in work up for renal transplant, may need BMS if has blockage, however will defer to interventional cardiology. Order placed, plan for cath this afternoon.  2. ESRD on HD  - LUE fistula 3. HTN: added metoprolol  - HR between 90s yesterday, now 120s, consider scale back ARB  4. Insulin-dependent diabetes 5. Hyperlipidemia: added lipitor 6. Murmur on exam: holosystolic vs machine like murmur, ?if has patent ductus arteriosus vs valvular abnormality vs previous vascular surgery for AVF  - Obtain echocardiogram  Weston Brass Almyra Deforest PA-C Pager: [phone]  I have seen and examined the patient along with Almyra Deforest PA-C.  I have reviewed the chart, notes and new data.  I agree with PA's note.  Key new complaints: mild chest pressure at night, dyspnea only climbing stairs Key examination changes: no clinical volume overload Key new findings / data: calcified proximal LAd stenosis, unclear if there is a stenosis due to calcification and registration artifact.  PLAN: Cardiac cath today. Femoral approach preferable due to dialysis access. This procedure has been fully reviewed with the patient and written informed consent has been obtained.   Sanda Klein, MD, [place] [phone] 03/30/2014, 10:43 AM

## 2014-03-30 NOTE — H&P (View-Only) (Signed)
Patient Name: Cynthia [name] Date of Encounter: 03/30/2014     Active Problems:   Chest pain    SUBJECTIVE  1 episode of chest pain around 5 am, pressure like sensation, resolved. Currently no CP or SOB  CURRENT MEDS . aspirin EC  81 mg Oral Daily  . atorvastatin  10 mg Oral q1800  . calcium gluconate  1 g Intravenous Once  . [START ON 03/31/2014] darbepoetin  60 mcg Intravenous Q Wed-HD  . docusate sodium  100 mg Oral BID  . gabapentin  100 mg Oral QHS  . heparin  5,000 Units Subcutaneous 3 times per day  . insulin aspart  0-9 Units Subcutaneous TID WC  . insulin glargine  10 Units Subcutaneous Daily  . irbesartan  300 mg Oral Daily  . metoprolol tartrate  50 mg Oral BID  . multivitamin  1 tablet Oral QHS  . pantoprazole  40 mg Oral Daily  . sevelamer carbonate  3,200 mg Oral TID WC    OBJECTIVE  Filed Vitals:   03/29/14 1548 03/29/14 1729 03/29/14 2130 03/30/14 0504  BP: 133/67 147/63 128/53 121/53  Pulse: 75 80 74 75  Temp: 97.1 F (36.2 C)  97.6 F (36.4 C) 98.4 F (36.9 C)  TempSrc: Oral  Oral Oral  Resp: 14  18 18   Height:      Weight: 156 lb 8.4 oz (71 kg)   170 lb 12.8 oz (77.474 kg)  SpO2:   97% 98%    Intake/Output Summary (Last 24 hours) at 03/30/14 0857 Last data filed at 03/29/14 1548  Gross per 24 hour  Intake      0 ml  Output   1082 ml  Net  -1082 ml   Filed Weights   03/29/14 1234 03/29/14 1548 03/30/14 0504  Weight: 170 lb 3.1 oz (77.2 kg) 156 lb 8.4 oz (71 kg) 170 lb 12.8 oz (77.474 kg)    PHYSICAL EXAM  General: Pleasant, NAD. Neuro: Alert and oriented X 3. Moves all extremities spontaneously. Psych: Normal affect. HEENT:  Normal  Neck: Supple without bruits or JVD. Lungs:  Resp regular and unlabored, CTA. Heart: RRR no s3, s4. 3/6 holosystolic vs machine like murmur near L upper sternal border Abdomen: Soft, non-tender, non-distended, BS + x 4.  Extremities: No clubbing, cyanosis or edema. DP/PT/Radials 2+ and equal  bilaterally. LUE AVF present  Accessory Clinical Findings  CBC  Recent Labs  03/29/14 0118  WBC 6.4  HGB 9.1*  HCT 27.0*  MCV 95.7  PLT [id number]   Basic Metabolic Panel  Recent Labs  03/29/14 0616 03/29/14 1727 03/30/14 0513  NA 131* 136* 139  K 6.5* 5.2 5.1  CL 93* 95* 97  CO2 25 26 28   GLUCOSE 172* 140* 112*  BUN 58* 19 28*  CREATININE 9.35* 4.37* 6.88*  CALCIUM 9.2 8.9 8.6  MG 2.5  --   --   PHOS 4.7*  --  5.6*   Liver Function Tests  Recent Labs  03/29/14 0118 03/30/14 0513  AST 12  --   ALT 12  --   ALKPHOS 120*  --   BILITOT 0.2*  --   PROT 7.2  --   ALBUMIN 3.6 3.1*   Cardiac Enzymes  Recent Labs  03/29/14 0616 03/29/14 1104 03/29/14 1727  TROPONINI <0.30 <0.30 <0.30   Hemoglobin A1C  Recent Labs  03/29/14 0616  HGBA1C 6.8*   Fasting Lipid Panel  Recent Labs  03/29/14 0616  CHOL 162  HDL 31*  LDLCALC 74  TRIG 284*  CHOLHDL 5.2   Thyroid Function Tests  Recent Labs  03/29/14 0530  TSH 5.800*    TELE NSR with HR 60-70s, no significant ventricular ectopy    ECG  NSR with pulmonary disease pattern    Radiology/Studies  Ct Coronary Morp W/cta Cor W/score W/ca W/cm &/or Wo/cm  03/30/2014   CLINICAL DATA:  43 year old female with HTN, DM and ESRD presenting with chest pain  EXAM: Cardiac / Coronary  CT  TECHNIQUE: The patient was scanned on a Philips 256 scanner.  FINDINGS: A 120 kV prospective scan was triggered in the descending thoracic aorta at 111 HU's. Axial non-contrast 87mm slices were carried out through the heart. The data set was analyzed on a dedicated work station and scored using the [place]. Gantry rotation speed was 270 msecs and collimation was .9 mm. No beta blockade and 0.4 mg of sl NTG was given. The 3D data set was reconstructed in 5% intervals of the 67-82 % of the R-R cycle. Diastolic phases were analyzed on a dedicated work station using MPR, MIP and VRT modes. The patient received 80 cc of  contrast.  Aorta: Normal size of the aortic root and ascending aorta and arch. No dissection, mild calcifications in the aortic arch.  Aortic Valve:  Trileaflet, no calcifications.  Coronary Arteries: Originating in a normal position. Right dominance.  Left main is a large vessel that gives rise to LAD and LCX. There is mild calcified plaque at the distal bifurcation associated with 0-25% stenosis.  LAD is a large caliber vessel that gives rise to a large diagonal branch. Ostial and proximal LAD has significant amount of calcified plaque. Unfortunately this segment is affected by motion, but there is possibly hemodynamically significant ostial stenosis. Mid and distal LAD appear normal.  Diagonal branch further subbranches and doesn't have plaque.  LCX is a medium size non-dominant vessel that gives rise to two obtuse marginal branches. There is mild non-calcified plaque in the proximal LCX associated with 0-25% stenosis, the rest of LCX and OMs appear normal.  RCA is a large dominant vessel that gives rise to PDA and PLVB. RCA is affected by motion [facility]) in its mid portion, however appears normal without plaque.  IMPRESSION: 1. Coronary calcium score of 149 all located in proximal LAD. This was 52 percentile for age and sex matched control.  2. Normal origin or coronary arteries.  Right dominance.  3. Significant calcification of the ostial and proximal LAD with possible significant stenosis. Considering that the patient is being worked up for kidney transplant she should undergo cardiac catheterization.  [name]   Electronically Signed   By: [name]   On: 03/30/2014 08:05   Dg Chest Portable 1 View  03/29/2014   CLINICAL DATA:  Acute onset of sharp central chest pain, with associated chest pressure. Left arm numbness and tingling. Symptoms started tonight during dialysis. Initial encounter.  EXAM: PORTABLE CHEST - 1 VIEW  COMPARISON:  Chest radiograph performed 12/31/2013  FINDINGS: The lungs  are well-aerated and clear. There is no evidence of focal opacification, pleural effusion or pneumothorax.  The cardiomediastinal silhouette is within normal limits. No acute osseous abnormalities are seen.  IMPRESSION: No acute cardiopulmonary process seen.   Electronically Signed   By: Garald Balding M.D.   On: 03/29/2014 02:06   Dg Humerus Left  03/23/2014   CLINICAL DATA:  Shuntogram.  EXAM: DG C-ARM 61-120 MIN; LEFT  HUMERUS - 2+ VIEW  COMPARISON:  None.  FINDINGS: Multiple images are submitted from a left upper arm fistulogram. These demonstrate mild focal stenosis within the central cephalic vein. Imaging demonstrates balloon dilatation of this area with improved appearance post dilatation. Slight residual stenosis common likely not hemodynamically significant.  IMPRESSION: Left upper extremity av fistulagram as above.   Electronically Signed   By: Rolm Baptise M.D.   On: 03/23/2014 16:14   Dg C-arm 1-60 Min  03/23/2014   CLINICAL DATA:  Shuntogram.  EXAM: DG C-ARM 61-120 MIN; LEFT HUMERUS - 2+ VIEW  COMPARISON:  None.  FINDINGS: Multiple images are submitted from a left upper arm fistulogram. These demonstrate mild focal stenosis within the central cephalic vein. Imaging demonstrates balloon dilatation of this area with improved appearance post dilatation. Slight residual stenosis common likely not hemodynamically significant.  IMPRESSION: Left upper extremity av fistulagram as above.   Electronically Signed   By: Rolm Baptise M.D.   On: 03/23/2014 16:14    ASSESSMENT AND PLAN  1. Chest pain during dialysis  - negative lexiscan with EF 62% in Feb 2015 for similar CP  - coronary CT 03/29/2014 calcium score 149 all located in prox LAD, 99th percentile, significant calcification of ostial and prox LAD with possible significant stenosis - consider cath as patient is being worked up for kidney transplant  - discussed with patient regarding benefit and risk of cardiac cath include vascular injury,  bleeding, arrythmia, MI or stroke with help of spanish translation by nursing staff, patient display clear understanding and agree to proceed. Will make NPO and plan for cardiac cath. Since in work up for renal transplant, may need BMS if has blockage, however will defer to interventional cardiology. Order placed, plan for cath this afternoon.  2. ESRD on HD  - LUE fistula 3. HTN: added metoprolol  - HR between 90s yesterday, now 120s, consider scale back ARB  4. Insulin-dependent diabetes 5. Hyperlipidemia: added lipitor 6. Murmur on exam: holosystolic vs machine like murmur, ?if has patent ductus arteriosus vs valvular abnormality vs previous vascular surgery for AVF  - Obtain echocardiogram  Weston Brass Almyra Deforest PA-C Pager: [phone]  I have seen and examined the patient along with Almyra Deforest PA-C.  I have reviewed the chart, notes and new data.  I agree with PA's note.  Key new complaints: mild chest pressure at night, dyspnea only climbing stairs Key examination changes: no clinical volume overload Key new findings / data: calcified proximal LAd stenosis, unclear if there is a stenosis due to calcification and registration artifact.  PLAN: Cardiac cath today. Femoral approach preferable due to dialysis access. This procedure has been fully reviewed with the patient and written informed consent has been obtained.   Sanda Klein, MD, [place] [phone] 03/30/2014, 10:43 AM

## 2014-03-30 NOTE — Interval H&P Note (Signed)
Cath Lab Visit (complete for each Cath Lab visit)  Clinical Evaluation Leading to the Procedure:   ACS: Yes.    Non-ACS:    Anginal Classification: CCS III  Anti-ischemic medical therapy: No Therapy  Non-Invasive Test Results: No non-invasive testing performed  Prior CABG: No previous CABG      History and Physical Interval Note:  03/30/2014 5:22 PM  Cynthia [name]  has presented today for surgery, with the diagnosis of cp  The various methods of treatment have been discussed with the patient and family. After consideration of risks, benefits and other options for treatment, the patient has consented to  Procedure(s): LEFT HEART CATHETERIZATION WITH CORONARY ANGIOGRAM (N/A) as a surgical intervention .  The patient's history has been reviewed, patient examined, no change in status, stable for surgery.  I have reviewed the patient's chart and labs.  Questions were answered to the patient's satisfaction.     Sinclair Grooms

## 2014-03-30 NOTE — Progress Notes (Signed)
Family Medicine Teaching Service Attending Note  I interviewed and examined patient Cynthia [name] and reviewed their tests and x-rays.  I discussed with Dr. Gerarda Fraction and reviewed their note for today.  I agree with their assessment and plan.     Additionally  Feels well no chest pain Appreciate cardiology and renal's attention Proceed per their plans Watch for low blood sugar given frequently NPO

## 2014-03-31 DIAGNOSIS — R0789 Other chest pain: Secondary | ICD-10-CM | POA: Diagnosis not present

## 2014-03-31 DIAGNOSIS — R079 Chest pain, unspecified: Secondary | ICD-10-CM

## 2014-03-31 LAB — RENAL FUNCTION PANEL
ANION GAP: 15 (ref 5–15)
Albumin: 3.1 g/dL — ABNORMAL LOW (ref 3.5–5.2)
BUN: 41 mg/dL — ABNORMAL HIGH (ref 6–23)
CO2: 26 mEq/L (ref 19–32)
Calcium: 8.8 mg/dL (ref 8.4–10.5)
Chloride: 91 mEq/L — ABNORMAL LOW (ref 96–112)
Creatinine, Ser: 9.93 mg/dL — ABNORMAL HIGH (ref 0.50–1.10)
GFR calc non Af Amer: 4 mL/min — ABNORMAL LOW (ref >90)
GFR, EST AFRICAN AMERICAN: 5 mL/min — AB (ref >90)
GLUCOSE: 94 mg/dL (ref 70–99)
POTASSIUM: 4.8 meq/L (ref 3.7–5.3)
Phosphorus: 7.2 mg/dL — ABNORMAL HIGH (ref 2.3–4.6)
Sodium: 132 mEq/L — ABNORMAL LOW (ref 137–147)

## 2014-03-31 LAB — GLUCOSE, CAPILLARY
GLUCOSE-CAPILLARY: 97 mg/dL (ref 70–99)
GLUCOSE-CAPILLARY: 89 mg/dL (ref 70–99)

## 2014-03-31 LAB — CBC
HEMATOCRIT: 26.3 % — AB (ref 36.0–46.0)
Hemoglobin: 8.8 g/dL — ABNORMAL LOW (ref 12.0–15.0)
MCH: 32 pg (ref 26.0–34.0)
MCHC: 33.5 g/dL (ref 30.0–36.0)
MCV: 95.6 fL (ref 78.0–100.0)
PLATELETS: 162 10*3/uL (ref 150–400)
RBC: 2.75 MIL/uL — AB (ref 3.87–5.11)
RDW: 14.1 % (ref 11.5–15.5)
WBC: 3.9 10*3/uL — AB (ref 4.0–10.5)

## 2014-03-31 MED ORDER — DARBEPOETIN ALFA-POLYSORBATE 60 MCG/0.3ML IJ SOLN
INTRAMUSCULAR | Status: AC
  Administered 2014-03-31: 60 ug via INTRAVENOUS
  Filled 2014-03-31: qty 0.3

## 2014-03-31 MED ORDER — ATORVASTATIN CALCIUM 10 MG PO TABS: 10.0000 mg | ORAL_TABLET | Freq: Every day | ORAL | Status: DC

## 2014-03-31 MED ORDER — LIDOCAINE-PRILOCAINE 2.5-2.5 % EX CREA
1.0000 "application " | TOPICAL_CREAM | CUTANEOUS | Status: DC | PRN
  Filled 2014-03-31: qty 5

## 2014-03-31 MED ORDER — ASPIRIN 81 MG PO TBEC: 81.0000 mg | DELAYED_RELEASE_TABLET | Freq: Every day | ORAL | Status: DC

## 2014-03-31 MED ORDER — SODIUM CHLORIDE 0.9 % IV SOLN: 100.0000 mL | INTRAVENOUS | Status: DC | PRN

## 2014-03-31 MED ORDER — PENTAFLUOROPROP-TETRAFLUOROETH EX AERO: 1.0000 "application " | INHALATION_SPRAY | CUTANEOUS | Status: DC | PRN

## 2014-03-31 MED ORDER — NEPRO/CARBSTEADY PO LIQD
237.0000 mL | ORAL | Status: DC | PRN
  Filled 2014-03-31: qty 237

## 2014-03-31 MED ORDER — ALTEPLASE 2 MG IJ SOLR
2.0000 mg | Freq: Once | INTRAMUSCULAR | Status: DC | PRN
  Filled 2014-03-31: qty 2

## 2014-03-31 MED ORDER — LIDOCAINE HCL (PF) 1 % IJ SOLN: 5.0000 mL | INTRAMUSCULAR | Status: DC | PRN

## 2014-03-31 MED ORDER — HEPARIN SODIUM (PORCINE) 1000 UNIT/ML DIALYSIS
1000.0000 [IU] | INTRAMUSCULAR | Status: DC | PRN
  Filled 2014-03-31: qty 1

## 2014-03-31 MED ORDER — METOPROLOL TARTRATE 50 MG PO TABS: 50.0000 mg | ORAL_TABLET | Freq: Two times a day (BID) | ORAL | Status: DC

## 2014-03-31 NOTE — Progress Notes (Signed)
Family Medicine Teaching Service Attending Note  I discussed patient Cynthia [name]  with Dr. Gerarda Fraction and reviewed their note for today.  I agree with their assessment and plan.

## 2014-03-31 NOTE — Progress Notes (Signed)
Subjective: Seen on dialysis, mild tenderness at right groin s/p cath yesterday  Objective: Vital signs in last 24 hours: Temp:  [98 F (36.7 C)-98.4 F (36.9 C)] 98.4 F (36.9 C) (10/07 0820) Pulse Rate:  [64-73] 68 (10/07 0927) Resp:  [18-20] 19 (10/07 0820) BP: (115-172)/(47-74) 137/63 mmHg (10/07 0927) SpO2:  [92 %-97 %] 95 % (10/07 0820) Weight:  [78.8 kg (173 lb 11.6 oz)-79.561 kg (175 lb 6.4 oz)] 78.8 kg (173 lb 11.6 oz) (10/07 0820) Weight change: 2.361 kg (5 lb 3.3 oz)  Intake/Output from previous day:   Intake/Output this shift:   Lab Results:  Recent Labs  03/29/14 0118 03/31/14 0500  WBC 6.4 3.9*  HGB 9.1* 8.8*  HCT 27.0* 26.3*  PLT 159 162   BMET:  Recent Labs  03/30/14 0513 03/31/14 0448  NA 139 132*  K 5.1 4.8  CL 97 91*  CO2 28 26  GLUCOSE 112* 94  BUN 28* 41*  CREATININE 6.88* 9.93*  CALCIUM 8.6 8.8  ALBUMIN 3.1* 3.1*   No results found for this basename: PTH,  in the last 72 hours Iron Studies: No results found for this basename: IRON, TIBC, TRANSFERRIN, FERRITIN,  in the last 72 hours  Studies/Results: No results found.  EXAM:  General appearance: Alert, in no apparent distress  Resp: CTA without rales, rhonchi, or wheezes  Cardio: RRR without murmur or rub  GI: + BS, soft and nontender  Extremities: No edema  Access: AVF @ LUA with BFR 400   HD: Home HD 5 days per week, 3 hours, per HD, dry wt 76kg, Heparin 2000, BFR 400  EPO 8000 weekly   Assessment/Plan: 1. Recurrent chest pain - most often at end of HD; coronary CT 10/5 showed significant calcification of ostial and prox LAD with possible significant stenosis; cardiac cath yesterday showed normal LV fxn, proximal LAD calcification. And no CAD.   2. ESRD - HD since 2011, home HD since 03/2013. HD today.  3. HTN/Volume - BP 137/63 on Irbesartan 300 mg qd, Metoprolol 50 mg bid; wt 78.8 kg, UF goal 3.5 L..  4. Anemia - Hgb 8.8, Aranesp 60 mcg on Wed.  5. 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  6. Nutrition - Alb 3.1, currently NPO, multivitamin.  7. DM - insulin per primary     LOS: 2 days   LYLES,CHARLES 03/31/2014,9:40 AM   Pt seen, examined, agree w assess/plan as above with additions as indicated.  Kelly Splinter MD pager [phone]    cell [phone] 03/31/2014, 10:58 AM

## 2014-03-31 NOTE — Progress Notes (Signed)
Pt discharge to home, all belongings returned, VSS, patient verbalized understanding of discharge instructions

## 2014-03-31 NOTE — Discharge Summary (Signed)
[facility] Discharge Summary  Patient name: Cynthia [name] Medical record number: [id number] Date of birth: Oct 04, 1970 Age: 43 y.o. Gender: female Date of Admission: 03/29/2014  Date of Discharge: 03/31/2014 Admitting Physician: Zigmund Gottron, MD  Primary Care Provider: Rexene Agent, MD Consultants: Cardiology  Indication for Hospitalization: Chest Pain  Discharge Diagnoses/Problem List:  Patient Active Problem List   Diagnosis Date Noted  . Pseudoaneurysm of arteriovenous dialysis fistula 03/16/2014  . Hyperkalemia 11/16/2013  . Acute pulmonary edema 11/16/2013  . End stage renal disease 09/23/2013  . Hypoglycemia 07/28/2013  . Hyperglycemia 07/28/2013  . Chronic constipation 07/28/2013  . Paresthesias 07/28/2013  . Shortness of breath 07/28/2013  . Chest pain, atypical 07/28/2013  . Insulin-dependent diabetes mellitus with renal complications   . 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 Orthostatic hypotension 02/10/2013  . ESRD on dialysis 11/24/2012  . Accelerated hypertension 11/24/2012  . Awaiting organ transplant 09/19/2012   Disposition: Discharge Home  Discharge Condition: Stable  Discharge Exam: See Progress Note  Brief Hospital Course:  Cynthia [name] is a 43 y.o. female who presented with acute onset chest pain during dialysis treatment. PMH is significant for end-stage renal disease on dialysis, hypertension, insulin-dependent diabetes, atypical chest pain, left upper extremity dialysis fistula, paresthesias.   Her hospital course, by problem is listed below:  Chest pain, acute - Heart score of 4 on admission so she was admitted for ACS rule out. Troponins and CXR negative. Cardiology consulted. Coronary CT performed followed by heart cath which showed calcified nonobstructive coronary atherosclerosis. Aspirin and Protonix were given daily. Patient denied any more chest pain while admitted. Source of chest pain was not identified.  Focus at this time will be coronary risk factor modification.  End stage renal disease - Patient is on home dialysis 5 times per week. She received two rounds of HD in hospital. 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 Continued epo. Hypertension - Continued home Irbesartan and added metoprolol BID  Hyperlipidemia - Patient found to have low HDL with elevated triglycerides. ASCVD score 6.2% 10-yr risk/50% lifetime risk.   Atorvastatin 10mg  daily started.  Insulin-dependent diabetes - A1c 6.8. Lantus 10 units daily (home regimen) with SSI.  Paresthesias -Gabapentin  Anxiety - Ativan every 6 hours when necessary   Issues for Follow Up:  -Patient will need outpatient Echo as she was unable to get procedure during hospital course but was recommended by cardiology -BP/HR monitoring on new medication  Significant Procedures: None  Significant Labs and Imaging:   Recent Labs Lab 03/29/14 0118 03/31/14 0500  WBC 6.4 3.9*  HGB 9.1* 8.8*  HCT 27.0* 26.3*  PLT 159 162    Recent Labs Lab 03/29/14 0118 03/29/14 0616 03/29/14 1727 03/30/14 0513 03/31/14 0448  NA 133* 131* 136* 139 132*  K 4.8 6.5* 5.2 5.1 4.8  CL 92* 93* 95* 97 91*  CO2 25 25 26 28 26   GLUCOSE 226* 172* 140* 112* 94  BUN 56* 58* 19 28* 41*  CREATININE 8.38* 9.35* 4.37* 6.88* 9.93*  CALCIUM 9.5 9.2 8.9 8.6 8.8  MG  --  2.5  --   --   --   PHOS  --  4.7*  --  5.6* 7.2*  ALKPHOS 120*  --   --   --   --   AST 12  --   --   --   --   ALT 12  --   --   --   --  ALBUMIN 3.6  --   --  3.1* 3.1*   Lipid Panel     Component Value Date/Time   CHOL 162 03/29/2014 0616   TRIG 284* 03/29/2014 0616   HDL 31* 03/29/2014 0616   CHOLHDL 5.2 03/29/2014 0616   VLDL 57* 03/29/2014 0616   LDLCALC 74 03/29/2014 0616   TSH 5.8  Trops neg x3  A1c 6.8  Phos 4.7(H)  Mg 2.5   Results/Tests Pending at Time of Discharge: None  Discharge Medications:    Medication List         aspirin 81 MG EC  tablet  Take 1 tablet (81 mg total) by mouth daily.     atorvastatin 10 MG tablet  Commonly known as:  LIPITOR  Take 1 tablet (10 mg total) by mouth daily at 6 PM.     docusate sodium 100 MG capsule  Commonly known as:  COLACE  Take 100 mg by mouth 2 (two) times daily.     gabapentin 100 MG capsule  Commonly known as:  NEURONTIN  Take 100 mg by mouth at bedtime.     LANTUS SOLOSTAR 100 UNIT/ML Solostar Pen  Generic drug:  Insulin Glargine  Inject 10 Units into the skin at bedtime.     metoprolol 50 MG tablet  Commonly known as:  LOPRESSOR  Take 1 tablet (50 mg total) by mouth 2 (two) times daily.     multivitamin Tabs tablet  Take 1 tablet by mouth daily.     omeprazole 20 MG tablet  Commonly known as:  PRILOSEC OTC  Take 20 mg by mouth daily.     sevelamer carbonate 800 MG tablet  Commonly known as:  RENVELA  Take 3,200 mg by mouth 3 (three) times daily with meals.     valsartan 80 MG tablet  Commonly known as:  DIOVAN  Take 80 mg by mouth daily.     VITAMIN D (ERGOCALCIFEROL) PO  Take 1 tablet by mouth daily.        Discharge Instructions: Please refer to Patient Instructions section of EMR for full details.  Patient was counseled important signs and symptoms that should prompt return to medical care, changes in medications, dietary instructions, activity restrictions, and follow up appointments.   Follow-Up Appointments: Patient to follow-up with PCP.    Katheren Shams, DO 04/01/2014, 11:01 AM PGY-1, [place]

## 2014-03-31 NOTE — Progress Notes (Signed)
Patient Name: Cynthia [name] Date of Encounter: 03/31/2014  Active Problems:   Chest pain   Length of Stay: 2  SUBJECTIVE  Feels well. On HD.  CURRENT MEDS . aspirin EC  81 mg Oral Daily  . atorvastatin  10 mg Oral q1800  . calcium gluconate  1 g Intravenous Once  . darbepoetin      . darbepoetin  60 mcg Intravenous Q Wed-HD  . docusate sodium  100 mg Oral BID  . gabapentin  100 mg Oral QHS  . heparin  5,000 Units Subcutaneous 3 times per day  . insulin aspart  0-9 Units Subcutaneous TID WC  . insulin glargine  10 Units Subcutaneous Daily  . irbesartan  300 mg Oral Daily  . metoprolol tartrate  50 mg Oral BID  . multivitamin  1 tablet Oral QHS  . pantoprazole  40 mg Oral Daily  . sevelamer carbonate  3,200 mg Oral TID WC  . sodium chloride  3 mL Intravenous Q12H    OBJECTIVE  No intake or output data in the 24 hours ending 03/31/14 1056 Filed Weights   03/30/14 0504 03/31/14 0453 03/31/14 0820  Weight: 77.474 kg (170 lb 12.8 oz) 79.561 kg (175 lb 6.4 oz) 78.8 kg (173 lb 11.6 oz)    PHYSICAL EXAM Filed Vitals:   03/31/14 0858 03/31/14 0927 03/31/14 0954 03/31/14 1030  BP: 135/64 137/63 127/62 130/62  Pulse: 65 68 70 67  Temp:      TempSrc:      Resp:      Height:      Weight:      SpO2:       No groin complications  LABS  CBC  Recent Labs  03/29/14 0118 03/31/14 0500  WBC 6.4 3.9*  HGB 9.1* 8.8*  HCT 27.0* 26.3*  MCV 95.7 95.6  PLT 159 0000000   Basic Metabolic Panel  Recent Labs  03/29/14 0616  03/30/14 0513 03/31/14 0448  NA 131*  < > 139 132*  K 6.5*  < > 5.1 4.8  CL 93*  < > 97 91*  CO2 25  < > 28 26  GLUCOSE 172*  < > 112* 94  BUN 58*  < > 28* 41*  CREATININE 9.35*  < > 6.88* 9.93*  CALCIUM 9.2  < > 8.6 8.8  MG 2.5  --   --   --   PHOS 4.7*  --  5.6* 7.2*  < > = values in this interval not displayed. Liver Function Tests  Recent Labs  03/29/14 0118 03/30/14 0513 03/31/14 0448  AST 12  --   --   ALT 12  --   --   ALKPHOS  120*  --   --   BILITOT 0.2*  --   --   PROT 7.2  --   --   ALBUMIN 3.6 3.1* 3.1*   No results found for this basename: LIPASE, AMYLASE,  in the last 72 hours Cardiac Enzymes  Recent Labs  03/29/14 0616 03/29/14 1104 03/29/14 1727  TROPONINI <0.30 <0.30 <0.30   BNP No components found with this basename: POCBNP,  D-Dimer No results found for this basename: DDIMER,  in the last 72 hours Hemoglobin A1C  Recent Labs  03/29/14 0616  HGBA1C 6.8*   Fasting Lipid Panel  Recent Labs  03/29/14 0616  CHOL 162  HDL 31*  LDLCALC 74  TRIG 284*  CHOLHDL 5.2   Thyroid Function Tests  Recent Labs  03/29/14 0530  TSH 5.800*     ASSESSMENT AND PLAN Calcified nonobstructive coronary atherosclerosis. Focus on coronary risk factor modification. Add low dose fenofibrate to statin for hypertriglyceridemia/low HDL. Recheck lipids and LFTs in 2-3 months.   Sanda Klein, MD, [facility] [phone] office [phone] pager 03/31/2014 10:56 AM

## 2014-03-31 NOTE — Discharge Instructions (Signed)
Discharge Date: 03/31/2014  Reason for Hospitalization: Chest Pain     New medications:  -Metoprolol -Atorvastatin  Be sure to follow up with your primary doctor within 1 week. Cardiology also suggested an echocardiogram to evaluate your murmur. Have this set up as an outpatient. Ask your primary doctor about considering low dose fenofibrate to your atorvastatin for high cholesterols and to recheck your lipids and liver function in 2-3 months, per heart doctor recommendations.   Thank you for letting us participate in your care!   Cath Site Care Refer to this sheet in the next few weeks. These instructions provide you with information on caring for yourself after your procedure. Your caregiver may also give you more specific instructions. Your treatment has been planned according to current medical practices, but problems sometimes occur. Call your caregiver if you have any problems or questions after your procedure. HOME CARE INSTRUCTIONS  You may shower the day after the procedure.Remove the bandage (dressing) and gently wash the site with plain soap and water.Gently pat the site dry.  Do not apply powder or lotion to the site.  Do not submerge the affected site in water for 3 to 5 days.  Inspect the site at least twice daily.  Do not flex or bend the affected arm for 24 hours.  No lifting over 5 pounds (2.3 kg) for 5 days after your procedure.  Do not drive home if you are discharged the same day of the procedure. Have someone else drive you.  You may drive 24 hours after the procedure unless otherwise instructed by your caregiver.  Do not operate machinery or power tools for 24 hours.  A responsible adult should be with you for the first 24 hours after you arrive home. What to expect:  Any bruising will usually fade within 1 to 2 weeks.  Blood that collects in the tissue (hematoma) may be painful to the touch. It should usually decrease in size and tenderness within  1 to 2 weeks. SEEK IMMEDIATE MEDICAL CARE IF:  You have unusual pain at the radial site.  You have redness, warmth, swelling, or pain at the radial site.  You have drainage (other than a small amount of blood on the dressing).  You have chills.  You have a fever or persistent symptoms for more than 72 hours.  You have a fever and your symptoms suddenly get worse.  Your arm becomes pale, cool, tingly, or numb.  You have heavy bleeding from the site. Hold pressure on the site. Document Released: 07/14/2010 Document Revised: 09/03/2011 Document Reviewed: 07/14/2010 [facility] Patient Information 2015 [place], [place]. This information is not intended to replace advice given to you by your health care provider. Make sure you discuss any questions you have with your health care provider.

## 2014-03-31 NOTE — Progress Notes (Signed)
Hemodialysis- Pt request to end treatment early d/t only running 3 hours at home and states "I will be doing dialysis at home tomorrow." (4 hour treatment ordered)Dr. Jonnie Finner paged. OK to end treatment early. Rinsed back after 3 hours 15 minutes. No complaints.

## 2014-03-31 NOTE — Progress Notes (Signed)
Patient ID: Cynthia [name], female   DOB: 01-17-1971, 43 y.o.   MRN: [id number] Family Medicine Teaching Service Daily Progress Note Intern Pager: [phone]  Patient name: Cynthia [name] Medical record number: [id number] Date of birth: 1970/08/06 Age: 43 y.o. Gender: female  Primary Care Provider: Rexene Agent, MD Consultants: Cardiology, Nephrology Code Status: Full  Pt Overview and Major Events to Date:  10/5: Admitted for CP Obs and ACS r/o; HD  10/6: Patient had heart cath  10/7: Echo planned and HD  Assessment and Plan: [name] is a 43 y.o. female presenting with acute onset chest pain during dialysis treatment. PMH is significant for end-stage renal disease on dialysis, hypertension, insulin-dependent diabetes, atypical chest pain, left upper extremity dialysis fistula, paresthesias.   #Chest pain, acute - heart score of 4 on admission; Troponins negative, CXR negative. Patient currently well-appearing, chest pain free and asymptomatic, however concern for ACS given heart score, CAD equivalent with DM, and hx with typical features, plan to rule out MI. Recent stress test (lexiscan without ischemia, EF 62%). - Cardiology consulted; appreciate recs - patient will be getting echo(if not today will discharge for outpatient echo f/u) -Coronary CT performed - please see impression below. Cath showed calcified nonobstructive coronary atherosclerosis.  - Colace for constipation secondary to morphine use  - Aspirin 81 mg daily; nitroglycerin 0.4 mg when necessary  - Morphine 2 mg every 2 hours when necessary  - Protonix 40 mg daily  - Zofran 4 mg every 6 hours when necessary for nausea   #End stage renal disease - patient is on home dialysis 5 times per week. 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  Suspect some of patient's symptoms with fatigue and DOE may be related to chronic anemia.  - Continue to monitor with CBC  -continue epo  #Hypertension - Initially elevated in ED to 170/70, since improved. - Irbesartan 300 mg daily  -metoprolol BID started by cards  #Hyperlipidemia - low HDL with elevated trigylcerides -ASCVD score 6.2% 10-yr risk/50% lifetime risk -Atorvastatin 10mg  daily started by cardiology -Cards suggested adding fenofibrate but use is contraindicated in dialysis patients  #Insulin-dependent diabetes -  A1c 6.8. CBGs ranging 140s-180s - Lantus 10 units daily (home regimen)  - Sliding scale insulin; sensitive scale   #Paresthesias  - Gabapentin 100 mg daily   #Anxiety  - Ativan 1 mg every 6 hours when necessary   FEN/GI: No IV fluids at this time due to hemodialysis patient; NPO Prophylaxis: Heparin subcutaneous  Disposition: Home today; may need outpatient echo follow-up.  Subjective: Patient doing well. She has no complaints. Denies any more CP.  Objective: Temp:  [98 F (36.7 C)-98.2 F (36.8 C)] 98.2 F (36.8 C) (10/07 0453) Pulse Rate:  [65-73] 66 (10/07 0453) Resp:  [18-20] 20 (10/07 0453) BP: (115-172)/(47-65) 130/50 mmHg (10/07 0453) SpO2:  [92 %-97 %] 92 % (10/07 0453) Weight:  [175 lb 6.4 oz (79.561 kg)] 175 lb 6.4 oz (79.561 kg) (10/07 0453)  Physical Exam: General -- A&O x3, pleasant and cooperative. Primary language is spanish.  HEENT -- NCAT, MMM, EOMI Integument -- intact. No rash or erythema  Chest -- good expansion. Lungs clear to auscultation. No wheezes or crackles heard Cardiac -- RRR. No murmurs noted. Substantial bruit from Lt UE dialysis fistula present.  Abdomen -- soft,  nontender. No masses palpable. Normal bowel sounds present.  Neuro -- CNs grossly intact. No focal deficits   Laboratory:  Recent Labs Lab 03/29/14 0118 03/31/14 0500  WBC 6.4 3.9*  HGB 9.1* 8.8*  HCT 27.0* 26.3*  PLT 159 162    Recent  Labs Lab 03/29/14 0118  03/29/14 1727 03/30/14 0513 03/31/14 0448  NA 133*  < > 136* 139 132*  K 4.8  < > 5.2 5.1 4.8  CL 92*  < > 95* 97 91*  CO2 25  < > 26 28 26   BUN 56*  < > 19 28* 41*  CREATININE 8.38*  < > 4.37* 6.88* 9.93*  CALCIUM 9.5  < > 8.9 8.6 8.8  PROT 7.2  --   --   --   --   BILITOT 0.2*  --   --   --   --   ALKPHOS 120*  --   --   --   --   ALT 12  --   --   --   --   AST 12  --   --   --   --   GLUCOSE 226*  < > 140* 112* 94  < > = values in this interval not displayed. Lipid Panel     Component Value Date/Time   CHOL 162 03/29/2014 0616   TRIG 284* 03/29/2014 0616   HDL 31* 03/29/2014 0616   CHOLHDL 5.2 03/29/2014 0616   VLDL 57* 03/29/2014 0616   LDLCALC 74 03/29/2014 0616   TSH 5.8 Trops neg x3 A1c 6.8 Phos 4.7(H) Mg 2.5  Imaging/Diagnostic Tests: Dg Chest Portable 1 View 03/29/2014  IMPRESSION: No acute cardiopulmonary process seen.     Ct Coronary Morp W/cta Cor W/score W/ca W/cm &/or Wo/cm 03/30/2014  IMPRESSION: 1. Coronary calcium score of 149 all located in proximal LAD. This was 59 percentile for age and sex matched control.  2. Normal origin or coronary arteries.  Right dominance.  3. Significant calcification of the ostial and proximal LAD with possible significant stenosis. Considering that the patient is being worked up for kidney transplant she should undergo cardiac catheterization.   Katheren Shams, DO 03/31/2014, 8:23 AM PGY-1, [facility] Intern pager: [phone], text pages welcome

## 2014-04-07 ENCOUNTER — Ambulatory Visit
Admission: RE | Admit: 2014-04-07 | Discharge: 2014-04-07 | Disposition: A | Discharge status: Home / Self Care | Payer: No Typology Code available for payment source | Source: Ambulatory Visit

## 2014-04-07 DIAGNOSIS — Z1231 Encounter for screening mammogram for malignant neoplasm of breast: Secondary | ICD-10-CM

## 2014-04-15 ENCOUNTER — Telehealth: Payer: Self-pay | Admitting: Vascular Surgery

## 2014-04-15 NOTE — Telephone Encounter (Signed)
Message copied by Georgiann Mccoy on Thu Apr 15, 2014  2:43 PM ------      Message from: [place], [place] K      Created: Wed Apr 14, 2014  4:32 PM      Regarding: RE: office f/u?       Dr. Oneida Alar said no follow up at this time. Thanks            ----- Message -----         From: Georgiann Mccoy         Sent: 04/14/2014   3:41 PM           To: Mena Goes, RN      Subject: office f/u?                                              Hi Kay,            This pt had a revision of their fistula on 03/23/14 w/ [facility].            Does this pt need an office f/u w/ u/s?            Thank you,      Selinda Eon                   ------

## 2014-06-03 ENCOUNTER — Encounter (HOSPITAL_COMMUNITY): Payer: Self-pay | Admitting: Interventional Cardiology

## 2014-07-15 ENCOUNTER — Ambulatory Visit
Admission: RE | Admit: 2014-07-15 | Discharge: 2014-07-15 | Disposition: A | Discharge status: Home / Self Care | Payer: Medicare Other | Source: Ambulatory Visit | Attending: Nephrology | Admitting: Nephrology

## 2014-07-15 ENCOUNTER — Other Ambulatory Visit: Payer: Self-pay | Admitting: Nephrology

## 2014-07-15 DIAGNOSIS — A150 Tuberculosis of lung: Secondary | ICD-10-CM

## 2014-12-20 ENCOUNTER — Other Ambulatory Visit (HOSPITAL_COMMUNITY): Payer: Self-pay | Admitting: Nephrology

## 2014-12-20 DIAGNOSIS — Z01818 Encounter for other preprocedural examination: Secondary | ICD-10-CM

## 2014-12-22 ENCOUNTER — Other Ambulatory Visit (HOSPITAL_COMMUNITY): Payer: Self-pay | Admitting: Nephrology

## 2014-12-22 DIAGNOSIS — Z01818 Encounter for other preprocedural examination: Secondary | ICD-10-CM

## 2014-12-24 ENCOUNTER — Encounter: Payer: Self-pay | Admitting: Diagnostic Neuroimaging

## 2014-12-24 ENCOUNTER — Ambulatory Visit (INDEPENDENT_AMBULATORY_CARE_PROVIDER_SITE_OTHER): Payer: Medicare Other | Admitting: Diagnostic Neuroimaging

## 2014-12-24 ENCOUNTER — Telehealth (HOSPITAL_COMMUNITY): Payer: Self-pay

## 2014-12-24 VITALS — BP 153/78 | HR 60 | Ht 61.0 in | Wt 175.0 lb

## 2014-12-24 DIAGNOSIS — R251 Tremor, unspecified: Secondary | ICD-10-CM

## 2014-12-24 NOTE — Progress Notes (Signed)
[facility]  PATIENT: Cynthia [name] DOB: [birth year]  REFERRING CLINICIAN: [place] HISTORY FROM: patient (via spanish Ecologist) REASON FOR VISIT: new consult    HISTORICAL  CHIEF COMPLAINT:  Chief Complaint  Patient presents with  . Tremors    rm 6, interpreter, [place]  . New patient    "Legs trembling"    HISTORY OF PRESENT ILLNESS:   44 year old right-handed female here for evaluation of tremors and lower show. Patient has had diabetes for 24 years. Patient has had dialysis for last 4 years, last 2 years at home. She has dialysis 5 days per week, hemodialysis.  For past 1 month patient has noticed onset of left leg, then right leg, sometimes bilateral shaking and trembling. Sometimes this affects her when she is driving. This happens on a daily basis, lasting 1-2 minutes at a time. Sometimes she notes tremor in her hands. She's had headache for the past 3 weeks as well. No specific triggering or alleviating factors. Symptoms gradually getting worse. She does not notice any change in relation to her dialysis treatments.  No family history of tremors.   REVIEW OF SYSTEMS: Full 14 system review of systems performed and notable only for chills weight gain fatigue palpitations swelling in legs shortness of breath snoring constipation joint swelling aching muscles flushing feeling hot feeling cold anemia easy bruising blurred vision loss of vision.   ALLERGIES: No Known Allergies  HOME MEDICATIONS: Outpatient Prescriptions Prior to Visit  Medication Sig Dispense Refill  . aspirin EC 81 MG EC tablet Take 1 tablet (81 mg total) by mouth daily. 90 tablet 3  . atorvastatin (LIPITOR) 10 MG tablet Take 1 tablet (10 mg total) by mouth daily at 6 PM. 30 tablet 3  . docusate sodium (COLACE) 100 MG capsule Take 100 mg by mouth 2 (two) times daily.    Marland Kitchen gabapentin (NEURONTIN) 100 MG capsule Take 100 mg by mouth at bedtime.    . Insulin  Glargine (LANTUS SOLOSTAR) 100 UNIT/ML Solostar Pen Inject 10 Units into the skin at bedtime.    . metoprolol (LOPRESSOR) 50 MG tablet Take 1 tablet (50 mg total) by mouth 2 (two) times daily. 60 tablet 3  . multivitamin (RENA-VIT) TABS tablet Take 1 tablet by mouth daily.    Marland Kitchen omeprazole (PRILOSEC OTC) 20 MG tablet Take 20 mg by mouth daily.    . sevelamer (RENVELA) 800 MG tablet Take 3,200 mg by mouth 3 (three) times daily with meals.     . valsartan (DIOVAN) 80 MG tablet Take 80 mg by mouth daily.    Marland Kitchen VITAMIN D, ERGOCALCIFEROL, PO Take 1 tablet by mouth daily.      No facility-administered medications prior to visit.    PAST MEDICAL HISTORY: Past Medical History  Diagnosis Date  . Hypertension   . ESRD on hemodialysis     Home HD 5x per week  . Depression   . Insulin-dependent diabetes mellitus with renal complications     INSULIN DEPENDENT  . GERD (gastroesophageal reflux disease)   . Neuromuscular disorder     NEUROPATHY  . History of blood transfusion     transfusion reaction  . Anemia     PAST SURGICAL HISTORY: Past Surgical History  Procedure Laterality Date  . Cholecystectomy    . Abdominal hysterectomy    . Breast surgery      biopsy bilateral  . Tubal ligation    . Dialysis fistula creation Left   . Eye surgery  Bilateral     lazer  . Revison of arteriovenous fistula Left 09/28/2013    Procedure: EXCISE ESCHAR LEFT ARM  ARTERIOVENOUS FISTULA WITH PLICATION OF LEFT ARM ARTERIOVENOUS FISTULA;  Surgeon: Elam Dutch, MD;  Location: [facility];  Service: Vascular;  Laterality: Left;  . Left heart catheterization with coronary angiogram N/A 03/30/2014    Procedure: LEFT HEART CATHETERIZATION WITH CORONARY ANGIOGRAM;  Surgeon: Sinclair Grooms, MD;  Location: [facility] CATH LAB;  Service: Cardiovascular;  Laterality: N/A;    FAMILY HISTORY: Family History  Problem Relation Age of Onset  . Cancer Maternal Grandmother   . Diabetes Maternal Grandfather   . Diabetes Paternal  Grandmother   . Diabetes Mother   . Kidney disease Mother   . Diabetes Father   . Heart disease Father   . Kidney disease Father   . Diabetes Sister   . Asthma Sister   . Lupus Sister   . Diabetes Brother     SOCIAL HISTORY:  History   Social History  . Marital Status: Legally Separated    Spouse Name: N/A  . Number of Children: 3  . Years of Education: 9   Occupational History  . disabled    Social History Main Topics  . Smoking status: Never Smoker   . Smokeless tobacco: Never Used  . Alcohol Use: No  . Drug Use: No  . Sexual Activity: Yes   Other Topics Concern  . Not on file   Social History Narrative   Lives in home with daughters, grand daughter, son-in-law   Caffeine use - 1 cup coffee sometimes      PHYSICAL EXAM  GENERAL EXAM/CONSTITUTIONAL: Vitals:  Filed Vitals:   12/24/14 0905  BP: 153/78  Pulse: 60  Height: 5\' 1"  (1.549 m)  Weight: 175 lb (79.379 kg)     Body mass index is 33.08 kg/(m^2).  Visual Acuity Screening   Right eye Left eye Both eyes  Without correction: 20/100 20/40   With correction:        Patient is in no distress; well developed, nourished and groomed; neck is supple  CARDIOVASCULAR:  Examination of carotid arteries is normal; no carotid bruits  Regular rate and rhythm, HOLOSYSTOLIC, MACHINE MURMUR; RADIATES TO CAROTIDS  Examination of peripheral vascular system by observation and palpation is normal; LEFT AV FISTULA WITH THRILL  EYES:  Ophthalmoscopic exam of optic discs and posterior segments is normal; no papilledema or hemorrhages  MUSCULOSKELETAL:  Gait, strength, tone, movements noted in Neurologic exam below  NEUROLOGIC: MENTAL STATUS:  No flowsheet data found.  awake, alert, oriented to person, place and time  recent and remote memory intact  normal attention and concentration  language fluent, comprehension intact, naming intact; SPANISH SPEAKING VIA INTERPRETER; CAN [place]; UNDERSTANDS SOME ENGLISH  fund of knowledge appropriate  CRANIAL NERVE:   2nd - no papilledema on fundoscopic exam  2nd, 3rd, 4th, 6th - pupils equal and reactive to light, visual fields full to confrontation, extraocular muscles intact, no nystagmus  5th - facial sensation symmetric  7th - facial strength symmetric  8th - hearing intact  9th - palate elevates symmetrically, uvula midline  11th - shoulder shrug symmetric  12th - tongue protrusion midline  MOTOR:   normal bulk and tone, full strength in the BUE, BLE; NO TREMOR  SENSORY:   normal and symmetric to light touch, pinprick, temperature; DECR VIB AT TOES  COORDINATION:   finger-nose-finger, fine finger movements, heel-shin  normal  REFLEXES:   deep tendon reflexes TRACE and symmetric  GAIT/STATION:   narrow based gait; able to walk on toes, heels and tandem; romberg is negative    DIAGNOSTIC DATA (LABS, IMAGING, TESTING) - I reviewed patient records, labs, notes, testing and imaging myself where available.  Lab Results  Component Value Date   WBC 3.9* 03/31/2014   HGB 8.8* 03/31/2014   HCT 26.3* 03/31/2014   MCV 95.6 03/31/2014   PLT 162 03/31/2014      Component Value Date/Time   NA 132* 03/31/2014 0448   K 4.8 03/31/2014 0448   CL 91* 03/31/2014 0448   CO2 26 03/31/2014 0448   GLUCOSE 94 03/31/2014 0448   BUN 41* 03/31/2014 0448   CREATININE 9.93* 03/31/2014 0448   CALCIUM 8.8 03/31/2014 0448   PROT 7.2 03/29/2014 0118   ALBUMIN 3.1* 03/31/2014 0448   AST 12 03/29/2014 0118   ALT 12 03/29/2014 0118   ALKPHOS 120* 03/29/2014 0118   BILITOT 0.2* 03/29/2014 0118   GFRNONAA 4* 03/31/2014 0448   GFRAA 5* 03/31/2014 0448   Lab Results  Component Value Date   CHOL 162 03/29/2014   HDL 31* 03/29/2014   LDLCALC 74 03/29/2014   TRIG 284* 03/29/2014   CHOLHDL 5.2 03/29/2014   Lab Results  Component Value Date   HGBA1C 6.8* 03/29/2014   No results found for: [id number] Lab  Results  Component Value Date   TSH 5.800* 03/29/2014    07/15/14 CXR - No active cardiopulmonary disease. [I reviewed images myself and agree with interpretation. -VRP]     ASSESSMENT AND PLAN  44 y.o. year old female here with 1 month of intermittent tremors and headaches. Neuro exam unremarkable today.   Ddx: metabolic / enhanced physiologic tremor, neuropathy (renal dz + diabetes), restless legs, CNS vascular/mass/inflammation   PLAN: - MRI brain to rule out secondary causes - consider slight increase in gabapentin if tremor worsens  Orders Placed This Encounter  Procedures  . MR Brain Wo Contrast   Return in about 6 weeks (around 02/04/2015).    Penni Bombard, MD [id number], [id number] AM Certified in Neurology, Neurophysiology and Neuroimaging  [facility] [address] [place], [place] [address] [phone]

## 2014-12-24 NOTE — Patient Instructions (Signed)
i will check MRI scan.

## 2014-12-24 NOTE — Telephone Encounter (Signed)
Pt daughter Terrace Arabia given detailed instructions for NUC MPI on Wednesday. Pt daughter did RBV same.

## 2014-12-29 ENCOUNTER — Ambulatory Visit (HOSPITAL_COMMUNITY)
Admission: RE | Admit: 2014-12-29 | Discharge: 2014-12-29 | Disposition: A | Discharge status: Home / Self Care | Payer: Medicare Other | Source: Ambulatory Visit | Attending: Cardiovascular Disease | Admitting: Cardiovascular Disease

## 2014-12-29 DIAGNOSIS — R0602 Shortness of breath: Secondary | ICD-10-CM | POA: Insufficient documentation

## 2014-12-29 DIAGNOSIS — I120 Hypertensive chronic kidney disease with stage 5 chronic kidney disease or end stage renal disease: Secondary | ICD-10-CM | POA: Diagnosis not present

## 2014-12-29 DIAGNOSIS — E119 Type 2 diabetes mellitus without complications: Secondary | ICD-10-CM | POA: Insufficient documentation

## 2014-12-29 DIAGNOSIS — R42 Dizziness and giddiness: Secondary | ICD-10-CM | POA: Diagnosis not present

## 2014-12-29 DIAGNOSIS — N186 End stage renal disease: Secondary | ICD-10-CM | POA: Insufficient documentation

## 2014-12-29 DIAGNOSIS — R002 Palpitations: Secondary | ICD-10-CM | POA: Diagnosis not present

## 2014-12-29 DIAGNOSIS — Z992 Dependence on renal dialysis: Secondary | ICD-10-CM | POA: Diagnosis not present

## 2014-12-29 DIAGNOSIS — R5383 Other fatigue: Secondary | ICD-10-CM | POA: Insufficient documentation

## 2014-12-29 DIAGNOSIS — Z01818 Encounter for other preprocedural examination: Secondary | ICD-10-CM | POA: Diagnosis present

## 2014-12-29 DIAGNOSIS — R079 Chest pain, unspecified: Secondary | ICD-10-CM | POA: Diagnosis not present

## 2014-12-29 DIAGNOSIS — Z8249 Family history of ischemic heart disease and other diseases of the circulatory system: Secondary | ICD-10-CM | POA: Insufficient documentation

## 2014-12-29 DIAGNOSIS — E669 Obesity, unspecified: Secondary | ICD-10-CM | POA: Insufficient documentation

## 2014-12-29 DIAGNOSIS — R531 Weakness: Secondary | ICD-10-CM | POA: Diagnosis not present

## 2014-12-29 DIAGNOSIS — Z6832 Body mass index (BMI) 32.0-32.9, adult: Secondary | ICD-10-CM | POA: Diagnosis not present

## 2014-12-29 DIAGNOSIS — R0609 Other forms of dyspnea: Secondary | ICD-10-CM | POA: Diagnosis not present

## 2014-12-29 DIAGNOSIS — I252 Old myocardial infarction: Secondary | ICD-10-CM | POA: Diagnosis not present

## 2014-12-29 DIAGNOSIS — Z794 Long term (current) use of insulin: Secondary | ICD-10-CM | POA: Diagnosis not present

## 2014-12-29 LAB — MYOCARDIAL PERFUSION IMAGING
CHL CUP NUCLEAR SSS: 2
CHL CUP RESTING HR STRESS: 65 {beats}/min
LV dias vol: 122 mL
LVSYSVOL: 49 mL
NUC STRESS TID: 1.28
Peak HR: 92 {beats}/min
SDS: 2
SRS: 0

## 2014-12-29 MED ORDER — TECHNETIUM TC 99M SESTAMIBI GENERIC - CARDIOLITE
31.0000 | Freq: Once | INTRAVENOUS | Status: AC | PRN
  Administered 2014-12-29: 31 via INTRAVENOUS

## 2014-12-29 MED ORDER — TECHNETIUM TC 99M SESTAMIBI GENERIC - CARDIOLITE
10.1000 | Freq: Once | INTRAVENOUS | Status: AC | PRN
  Administered 2014-12-29: 10.1 via INTRAVENOUS

## 2014-12-29 MED ORDER — AMINOPHYLLINE 25 MG/ML IV SOLN
75.0000 mg | Freq: Once | INTRAVENOUS | Status: AC
  Administered 2014-12-29: 75 mg via INTRAVENOUS

## 2014-12-29 MED ORDER — REGADENOSON 0.4 MG/5ML IV SOLN
0.4000 mg | Freq: Once | INTRAVENOUS | Status: AC
  Administered 2014-12-29: 0.4 mg via INTRAVENOUS

## 2015-01-04 ENCOUNTER — Inpatient Hospital Stay (HOSPITAL_COMMUNITY)
Admission: EM | Admit: 2015-01-04 | Discharge: 2015-01-07 | DRG: 682 | Disposition: A | Discharge status: Home / Self Care | Payer: Medicare Other | Attending: Internal Medicine | Admitting: Internal Medicine

## 2015-01-04 ENCOUNTER — Emergency Department (HOSPITAL_COMMUNITY): Payer: Medicare Other

## 2015-01-04 ENCOUNTER — Encounter (HOSPITAL_COMMUNITY): Payer: Self-pay

## 2015-01-04 DIAGNOSIS — E1122 Type 2 diabetes mellitus with diabetic chronic kidney disease: Secondary | ICD-10-CM | POA: Diagnosis present

## 2015-01-04 DIAGNOSIS — E11649 Type 2 diabetes mellitus with hypoglycemia without coma: Secondary | ICD-10-CM | POA: Diagnosis present

## 2015-01-04 DIAGNOSIS — Z794 Long term (current) use of insulin: Secondary | ICD-10-CM | POA: Diagnosis not present

## 2015-01-04 DIAGNOSIS — N189 Chronic kidney disease, unspecified: Secondary | ICD-10-CM | POA: Diagnosis not present

## 2015-01-04 DIAGNOSIS — K3184 Gastroparesis: Secondary | ICD-10-CM | POA: Diagnosis present

## 2015-01-04 DIAGNOSIS — I120 Hypertensive chronic kidney disease with stage 5 chronic kidney disease or end stage renal disease: Principal | ICD-10-CM | POA: Diagnosis present

## 2015-01-04 DIAGNOSIS — I10 Essential (primary) hypertension: Secondary | ICD-10-CM | POA: Diagnosis not present

## 2015-01-04 DIAGNOSIS — E109 Type 1 diabetes mellitus without complications: Secondary | ICD-10-CM | POA: Diagnosis present

## 2015-01-04 DIAGNOSIS — F329 Major depressive disorder, single episode, unspecified: Secondary | ICD-10-CM | POA: Diagnosis present

## 2015-01-04 DIAGNOSIS — D631 Anemia in chronic kidney disease: Secondary | ICD-10-CM | POA: Diagnosis present

## 2015-01-04 DIAGNOSIS — N2581 Secondary hyperparathyroidism of renal origin: Secondary | ICD-10-CM | POA: Diagnosis present

## 2015-01-04 DIAGNOSIS — Z9049 Acquired absence of other specified parts of digestive tract: Secondary | ICD-10-CM | POA: Diagnosis present

## 2015-01-04 DIAGNOSIS — D649 Anemia, unspecified: Secondary | ICD-10-CM | POA: Diagnosis present

## 2015-01-04 DIAGNOSIS — R011 Cardiac murmur, unspecified: Secondary | ICD-10-CM | POA: Diagnosis present

## 2015-01-04 DIAGNOSIS — E119 Type 2 diabetes mellitus without complications: Secondary | ICD-10-CM | POA: Diagnosis present

## 2015-01-04 DIAGNOSIS — R1110 Vomiting, unspecified: Secondary | ICD-10-CM

## 2015-01-04 DIAGNOSIS — E1143 Type 2 diabetes mellitus with diabetic autonomic (poly)neuropathy: Secondary | ICD-10-CM | POA: Diagnosis present

## 2015-01-04 DIAGNOSIS — E1129 Type 2 diabetes mellitus with other diabetic kidney complication: Secondary | ICD-10-CM | POA: Diagnosis not present

## 2015-01-04 DIAGNOSIS — I160 Hypertensive urgency: Secondary | ICD-10-CM | POA: Diagnosis present

## 2015-01-04 DIAGNOSIS — Z7982 Long term (current) use of aspirin: Secondary | ICD-10-CM | POA: Diagnosis not present

## 2015-01-04 DIAGNOSIS — I340 Nonrheumatic mitral (valve) insufficiency: Secondary | ICD-10-CM | POA: Diagnosis present

## 2015-01-04 DIAGNOSIS — N186 End stage renal disease: Secondary | ICD-10-CM | POA: Diagnosis present

## 2015-01-04 DIAGNOSIS — Z9851 Tubal ligation status: Secondary | ICD-10-CM | POA: Diagnosis not present

## 2015-01-04 DIAGNOSIS — K219 Gastro-esophageal reflux disease without esophagitis: Secondary | ICD-10-CM | POA: Diagnosis present

## 2015-01-04 DIAGNOSIS — I150 Renovascular hypertension: Secondary | ICD-10-CM | POA: Diagnosis present

## 2015-01-04 DIAGNOSIS — Z90710 Acquired absence of both cervix and uterus: Secondary | ICD-10-CM | POA: Diagnosis not present

## 2015-01-04 DIAGNOSIS — Z992 Dependence on renal dialysis: Secondary | ICD-10-CM

## 2015-01-04 LAB — TROPONIN I: Troponin I: <0.03 ng/mL (ref <0.031)

## 2015-01-04 LAB — CBC WITH DIFFERENTIAL/PLATELET
BASOS PCT: 1 % (ref 0–1)
Basophils Absolute: 0 10*3/uL (ref 0.0–0.1)
EOS ABS: 0.2 10*3/uL (ref 0.0–0.7)
Eosinophils Relative: 4 % (ref 0–5)
HCT: 29.7 % — ABNORMAL LOW (ref 36.0–46.0)
HEMOGLOBIN: 10.2 g/dL — AB (ref 12.0–15.0)
LYMPHS ABS: 1.6 10*3/uL (ref 0.7–4.0)
LYMPHS PCT: 30 % (ref 12–46)
MCH: 32.2 pg (ref 26.0–34.0)
MCHC: 34.3 g/dL (ref 30.0–36.0)
MCV: 93.7 fL (ref 78.0–100.0)
Monocytes Absolute: 0.4 10*3/uL (ref 0.1–1.0)
Monocytes Relative: 8 % (ref 3–12)
NEUTROS ABS: 3.1 10*3/uL (ref 1.7–7.7)
NEUTROS PCT: 57 % (ref 43–77)
Platelets: 172 10*3/uL (ref 150–400)
RBC: 3.17 MIL/uL — ABNORMAL LOW (ref 3.87–5.11)
RDW: 13.5 % (ref 11.5–15.5)
WBC: 5.3 10*3/uL (ref 4.0–10.5)

## 2015-01-04 LAB — MRSA PCR SCREENING: MRSA by PCR: NEGATIVE

## 2015-01-04 LAB — BASIC METABOLIC PANEL
Anion gap: 12 (ref 5–15)
BUN: 36 mg/dL — AB (ref 6–20)
CALCIUM: 9.5 mg/dL (ref 8.9–10.3)
CO2: 29 mmol/L (ref 22–32)
CREATININE: 8.1 mg/dL — AB (ref 0.44–1.00)
Chloride: 93 mmol/L — ABNORMAL LOW (ref 101–111)
GFR calc Af Amer: 6 mL/min — ABNORMAL LOW (ref >60)
GFR, EST NON AFRICAN AMERICAN: 5 mL/min — AB (ref >60)
GLUCOSE: 167 mg/dL — AB (ref 65–99)
POTASSIUM: 4.2 mmol/L (ref 3.5–5.1)
SODIUM: 134 mmol/L — AB (ref 135–145)

## 2015-01-04 LAB — GLUCOSE, CAPILLARY
GLUCOSE-CAPILLARY: 153 mg/dL — AB (ref 65–99)
Glucose-Capillary: 144 mg/dL — ABNORMAL HIGH (ref 65–99)
Glucose-Capillary: 83 mg/dL (ref 65–99)

## 2015-01-04 MED ORDER — LANTHANUM CARBONATE 500 MG PO CHEW
1000.0000 mg | CHEWABLE_TABLET | Freq: Three times a day (TID) | ORAL | Status: DC
  Administered 2015-01-04 – 2015-01-07 (×5): 1000 mg via ORAL
  Filled 2015-01-04 (×12): qty 2

## 2015-01-04 MED ORDER — HEPARIN SODIUM (PORCINE) 5000 UNIT/ML IJ SOLN
5000.0000 [IU] | Freq: Three times a day (TID) | INTRAMUSCULAR | Status: DC
  Administered 2015-01-04 – 2015-01-07 (×7): 5000 [IU] via SUBCUTANEOUS
  Filled 2015-01-04 (×11): qty 1

## 2015-01-04 MED ORDER — INSULIN ASPART 100 UNIT/ML ~~LOC~~ SOLN
0.0000 [IU] | Freq: Three times a day (TID) | SUBCUTANEOUS | Status: DC
  Administered 2015-01-04: 3 [IU] via SUBCUTANEOUS
  Administered 2015-01-04: 2 [IU] via SUBCUTANEOUS

## 2015-01-04 MED ORDER — INSULIN ASPART 100 UNIT/ML ~~LOC~~ SOLN
0.0000 [IU] | Freq: Every day | SUBCUTANEOUS | Status: DC
  Administered 2015-01-06: 2 [IU] via SUBCUTANEOUS

## 2015-01-04 MED ORDER — METOPROLOL TARTRATE 25 MG PO TABS: 25.0000 mg | ORAL_TABLET | Freq: Two times a day (BID) | ORAL | Status: DC

## 2015-01-04 MED ORDER — TETANUS-DIPHTH-ACELL PERTUSSIS 5-2.5-18.5 LF-MCG/0.5 IM SUSP: 0.5000 mL | Freq: Once | INTRAMUSCULAR | Status: DC

## 2015-01-04 MED ORDER — SEVELAMER CARBONATE 800 MG PO TABS
800.0000 mg | ORAL_TABLET | ORAL | Status: DC
  Administered 2015-01-04 (×2): 800 mg via ORAL
  Administered 2015-01-05: 1600 mg via ORAL
  Filled 2015-01-04 (×12): qty 2

## 2015-01-04 MED ORDER — CALCITRIOL 0.5 MCG PO CAPS
0.5000 ug | ORAL_CAPSULE | Freq: Every day | ORAL | Status: DC
  Administered 2015-01-04 – 2015-01-07 (×3): 0.5 ug via ORAL
  Filled 2015-01-04 (×4): qty 1

## 2015-01-04 MED ORDER — ACETAMINOPHEN 325 MG PO TABS
650.0000 mg | ORAL_TABLET | Freq: Four times a day (QID) | ORAL | Status: DC | PRN
  Administered 2015-01-06 – 2015-01-07 (×2): 650 mg via ORAL
  Filled 2015-01-04 (×2): qty 2

## 2015-01-04 MED ORDER — INSULIN GLARGINE 100 UNIT/ML ~~LOC~~ SOLN
10.0000 [IU] | Freq: Every day | SUBCUTANEOUS | Status: DC
  Administered 2015-01-04 – 2015-01-06 (×3): 10 [IU] via SUBCUTANEOUS
  Filled 2015-01-04 (×4): qty 0.1

## 2015-01-04 MED ORDER — ONDANSETRON HCL 4 MG PO TABS: 4.0000 mg | ORAL_TABLET | Freq: Four times a day (QID) | ORAL | Status: DC | PRN

## 2015-01-04 MED ORDER — METOPROLOL TARTRATE 50 MG PO TABS
50.0000 mg | ORAL_TABLET | Freq: Two times a day (BID) | ORAL | Status: DC
  Administered 2015-01-04 – 2015-01-06 (×3): 50 mg via ORAL
  Filled 2015-01-04 (×4): qty 1
  Filled 2015-01-04: qty 2
  Filled 2015-01-04: qty 1

## 2015-01-04 MED ORDER — NICARDIPINE HCL IN NACL 20-0.86 MG/200ML-% IV SOLN
3.0000 mg/h | INTRAVENOUS | Status: DC
  Administered 2015-01-04: 3 mg/h via INTRAVENOUS
  Filled 2015-01-04: qty 200

## 2015-01-04 MED ORDER — SODIUM CHLORIDE 0.9 % IJ SOLN
3.0000 mL | Freq: Two times a day (BID) | INTRAMUSCULAR | Status: DC
  Administered 2015-01-04 – 2015-01-07 (×7): 3 mL via INTRAVENOUS

## 2015-01-04 MED ORDER — HYDRALAZINE HCL 20 MG/ML IJ SOLN
5.0000 mg | Freq: Once | INTRAMUSCULAR | Status: AC
  Administered 2015-01-04: 5 mg via INTRAVENOUS
  Filled 2015-01-04: qty 1

## 2015-01-04 MED ORDER — ONDANSETRON HCL 4 MG/2ML IJ SOLN
4.0000 mg | Freq: Four times a day (QID) | INTRAMUSCULAR | Status: DC | PRN
  Administered 2015-01-04 – 2015-01-05 (×3): 4 mg via INTRAVENOUS
  Filled 2015-01-04 (×3): qty 2

## 2015-01-04 MED ORDER — HYDRALAZINE HCL 20 MG/ML IJ SOLN
10.0000 mg | Freq: Four times a day (QID) | INTRAMUSCULAR | Status: DC | PRN
  Administered 2015-01-04 – 2015-01-05 (×2): 10 mg via INTRAVENOUS
  Filled 2015-01-04 (×2): qty 1

## 2015-01-04 MED ORDER — DOCUSATE SODIUM 100 MG PO CAPS
100.0000 mg | ORAL_CAPSULE | Freq: Two times a day (BID) | ORAL | Status: DC
  Administered 2015-01-04 – 2015-01-07 (×5): 100 mg via ORAL
  Filled 2015-01-04 (×8): qty 1

## 2015-01-04 MED ORDER — ASPIRIN EC 81 MG PO TBEC
81.0000 mg | DELAYED_RELEASE_TABLET | Freq: Every day | ORAL | Status: DC
  Administered 2015-01-04 – 2015-01-07 (×2): 81 mg via ORAL
  Filled 2015-01-04 (×4): qty 1

## 2015-01-04 MED ORDER — SEVELAMER CARBONATE 800 MG PO TABS
4000.0000 mg | ORAL_TABLET | Freq: Three times a day (TID) | ORAL | Status: DC
  Administered 2015-01-04 – 2015-01-07 (×5): 4000 mg via ORAL
  Filled 2015-01-04 (×12): qty 5

## 2015-01-04 MED ORDER — ACETAMINOPHEN 325 MG PO TABS
650.0000 mg | ORAL_TABLET | Freq: Once | ORAL | Status: AC
  Administered 2015-01-04: 650 mg via ORAL
  Filled 2015-01-04: qty 2

## 2015-01-04 MED ORDER — HEPARIN SODIUM (PORCINE) 5000 UNIT/ML IJ SOLN: 5000.0000 [IU] | Freq: Three times a day (TID) | INTRAMUSCULAR | Status: DC

## 2015-01-04 MED ORDER — RENA-VITE PO TABS
1.0000 | ORAL_TABLET | Freq: Every day | ORAL | Status: DC
  Administered 2015-01-04 – 2015-01-07 (×2): 1 via ORAL
  Filled 2015-01-04 (×4): qty 1

## 2015-01-04 MED ORDER — METOCLOPRAMIDE HCL 5 MG PO TABS
5.0000 mg | ORAL_TABLET | Freq: Three times a day (TID) | ORAL | Status: DC
  Administered 2015-01-04 – 2015-01-05 (×4): 5 mg via ORAL
  Filled 2015-01-04 (×6): qty 1

## 2015-01-04 MED ORDER — OXYCODONE-ACETAMINOPHEN 5-325 MG PO TABS
1.0000 | ORAL_TABLET | Freq: Four times a day (QID) | ORAL | Status: DC | PRN
  Administered 2015-01-04 (×2): 2 via ORAL
  Filled 2015-01-04 (×2): qty 2

## 2015-01-04 MED ORDER — LISINOPRIL 5 MG PO TABS
5.0000 mg | ORAL_TABLET | Freq: Every day | ORAL | Status: DC
  Administered 2015-01-04 – 2015-01-07 (×3): 5 mg via ORAL
  Filled 2015-01-04 (×4): qty 1

## 2015-01-04 MED ORDER — ENOXAPARIN SODIUM 30 MG/0.3ML ~~LOC~~ SOLN
30.0000 mg | SUBCUTANEOUS | Status: DC
  Administered 2015-01-04: 30 mg via SUBCUTANEOUS
  Filled 2015-01-04: qty 0.3

## 2015-01-04 MED ORDER — FLUOXETINE HCL 20 MG PO TABS
20.0000 mg | ORAL_TABLET | Freq: Every day | ORAL | Status: DC
  Administered 2015-01-04 – 2015-01-07 (×2): 20 mg via ORAL
  Filled 2015-01-04 (×4): qty 1

## 2015-01-04 MED ORDER — NICARDIPINE HCL IN NACL 20-0.86 MG/200ML-% IV SOLN
3.0000 mg/h | Freq: Once | INTRAVENOUS | Status: AC
Start: 2015-01-04 — End: 2015-01-04
  Administered 2015-01-04: 5 mg/h via INTRAVENOUS
  Filled 2015-01-04: qty 200

## 2015-01-04 MED ORDER — ACETAMINOPHEN 650 MG RE SUPP: 650.0000 mg | Freq: Four times a day (QID) | RECTAL | Status: DC | PRN

## 2015-01-04 NOTE — ED Provider Notes (Addendum)
CSN: [id number]     Arrival date & time 01/04/15  0351 History   First MD Initiated Contact with Patient 01/04/15 0409     Chief Complaint  Patient presents with  . Hypertension     (Consider location/radiation/quality/duration/timing/severity/associated sxs/prior Treatment) HPI  This is a 44 year old female with a history of hypertension, end-stage renal disease on hemodialysis 5 times per week at home, diabetes who presents with high blood pressure. Patient reports that her blood pressures have been high for over a week. She reports waxing and waning headache that worsened this evening. Currently pain is 8 out of 10. She denies any weakness, numbness, tingling. She denies any chest pain but does report some shortness of breath. She last dialyzed earlier this evening. She took her metoprolol at approximately 8 PM but did not have any relief of her symptoms.  Past Medical History  Diagnosis Date  . Hypertension   . ESRD on hemodialysis     Home HD 5x per week  . Depression   . Insulin-dependent diabetes mellitus with renal complications     INSULIN DEPENDENT  . GERD (gastroesophageal reflux disease)   . Neuromuscular disorder     NEUROPATHY  . History of blood transfusion     transfusion reaction  . Anemia    Past Surgical History  Procedure Laterality Date  . Cholecystectomy    . Abdominal hysterectomy    . Breast surgery      biopsy bilateral  . Tubal ligation    . Dialysis fistula creation Left   . Eye surgery Bilateral     lazer  . Revison of arteriovenous fistula Left 09/28/2013    Procedure: EXCISE ESCHAR LEFT ARM  ARTERIOVENOUS FISTULA WITH PLICATION OF LEFT ARM ARTERIOVENOUS FISTULA;  Surgeon: Elam Dutch, MD;  Location: [place];  Service: Vascular;  Laterality: Left;  . Left heart catheterization with coronary angiogram N/A 03/30/2014    Procedure: LEFT HEART CATHETERIZATION WITH CORONARY ANGIOGRAM;  Surgeon: Sinclair Grooms, MD;  Location: [facility] C[facility] LAB;  Service:  Cardiovascular;  Laterality: N/A;   Family History  Problem Relation Age of Onset  . Cancer Maternal Grandmother   . Diabetes Maternal Grandfather   . Diabetes Paternal Grandmother   . Diabetes Mother   . Kidney disease Mother   . Diabetes Father   . Heart disease Father   . Kidney disease Father   . Diabetes Sister   . Asthma Sister   . Lupus Sister   . Diabetes Brother    History  Substance Use Topics  . Smoking status: Never Smoker   . Smokeless tobacco: Never Used  . Alcohol Use: No   OB History    No data available     Review of Systems  Constitutional: Negative for fever.  Respiratory: Positive for shortness of breath. Negative for cough and chest tightness.   Cardiovascular: Negative for chest pain.  Gastrointestinal: Negative for nausea, vomiting and abdominal pain.  Genitourinary: Negative for dysuria.  Musculoskeletal: Negative for back pain.  Neurological: Positive for headaches. Negative for dizziness, weakness, light-headedness and numbness.  Psychiatric/Behavioral: Negative for confusion.  All other systems reviewed and are negative.     Allergies  Review of patient's allergies indicates no known allergies.  Home Medications   Prior to Admission medications   Medication Sig Start Date End Date Taking? Authorizing Provider  aspirin EC 81 MG EC tablet Take 1 tablet (81 mg total) by mouth daily. 03/31/14  Yes Katheren Shams,  DO  calcitRIOL (ROCALTROL) 0.5 MCG capsule Take 0.5 mcg by mouth daily.   Yes Historical Provider, MD  docusate sodium (COLACE) 100 MG capsule Take 100 mg by mouth 2 (two) times daily.   Yes Historical Provider, MD  FLUoxetine (PROZAC) 20 MG tablet Take 20 mg by mouth daily.   Yes Historical Provider, MD  Insulin Glargine (LANTUS SOLOSTAR) 100 UNIT/ML Solostar Pen Inject 10 Units into the skin at bedtime.   Yes Historical Provider, MD  Lanthanum Carbonate 1000 MG PACK Take 1,000 mg by mouth 3 (three) times daily with meals.    Yes  Historical Provider, MD  metoCLOPramide (REGLAN) 5 MG tablet Take 5 mg by mouth 3 (three) times daily.   Yes Historical Provider, MD  metoprolol (LOPRESSOR) 50 MG tablet Take 1 tablet (50 mg total) by mouth 2 (two) times daily. 03/31/14  Yes Katheren Shams, DO  multivitamin (RENA-VIT) TABS tablet Take 1 tablet by mouth daily.   Yes Historical Provider, MD  sevelamer (RENVELA) 800 MG tablet Take 800-4,000 mg by mouth 3 (three) times daily with meals. Take [phone] with snacks and 4000 with meals   Yes Historical Provider, MD   BP 203/84 mmHg  Pulse 64  Temp(Src) 98.2 F (36.8 C) (Oral)  Resp 11  Ht 5\' 1"  (1.549 m)  Wt 173 lb 11.6 oz (78.801 kg)  BMI 32.84 kg/m2  SpO2 98% Physical Exam  Constitutional: She is oriented to person, place, and time. She appears well-developed and well-nourished. No distress.  HENT:  Head: Normocephalic and atraumatic.  Eyes:  Pupils 2 mm reactive bilaterally  Cardiovascular: Normal rate, regular rhythm and normal heart sounds.   No murmur heard. Pulmonary/Chest: Effort normal and breath sounds normal. No respiratory distress. She has no wheezes.  Abdominal: Soft. Bowel sounds are normal. There is no tenderness. There is no rebound and no guarding.  Neurological: She is alert and oriented to person, place, and time.  Fluent speech, cranial nerves II through XII intact  Skin: Skin is warm and dry.  Dialysis access in left upper extremity  Psychiatric: She has a normal mood and affect.  Nursing note and vitals reviewed.   ED Course  Procedures (including critical care time)  CRITICAL CARE Performed by: Merryl Hacker   Total critical care time: 45 min  Critical care time was exclusive of separately billable procedures and treating other patients.  Critical care was necessary to treat or prevent imminent or life-threatening deterioration.  Critical care was time spent personally by me on the following activities: development of treatment plan  with patient and/or surrogate as well as nursing, discussions with consultants, evaluation of patient's response to treatment, examination of patient, obtaining history from patient or surrogate, ordering and performing treatments and interventions, ordering and review of laboratory studies, ordering and review of radiographic studies, pulse oximetry and re-evaluation of patient's condition.  Labs Review Labs Reviewed  CBC WITH DIFFERENTIAL/PLATELET - Abnormal; Notable for the following:    RBC 3.17 (*)    Hemoglobin 10.2 (*)    HCT 29.7 (*)    All other components within normal limits  BASIC METABOLIC PANEL - Abnormal; Notable for the following:    Sodium 134 (*)    Chloride 93 (*)    Glucose, Bld 167 (*)    BUN 36 (*)    Creatinine, Ser 8.10 (*)    GFR calc non Af Amer 5 (*)    GFR calc Af Amer 6 (*)    All  other components within normal limits  TROPONIN I    Imaging Review Ct Head Wo Contrast  01/04/2015   CLINICAL DATA:  Headaches for 1 week.  EXAM: CT HEAD WITHOUT CONTRAST  TECHNIQUE: Contiguous axial images were obtained from the base of the skull through the vertex without intravenous contrast.  COMPARISON:  MRI brain 07/02/2013  FINDINGS: Ventricles and sulci are symmetrical. No mass effect or midline shift. No abnormal extra-axial fluid collections. Gray-white matter junctions are distinct. Basal cisterns are not effaced. No evidence of acute intracranial hemorrhage. No depressed skull fractures. Visualized paranasal sinuses and mastoid air cells are not opacified. Focal cystic lesion in the subcutaneous fat over the C1 spinous process and measuring about 1.8 cm diameter. This probably represents a sebaceous cyst.  IMPRESSION: No acute intracranial abnormalities.   Electronically Signed   By: Lucienne Capers M.D.   On: 01/04/2015 04:38   Dg Chest Portable 1 View  01/04/2015   CLINICAL DATA:  Chest tightness and shortness of breath tonight.  EXAM: PORTABLE CHEST - 1 VIEW   COMPARISON:  None.  FINDINGS: The heart size and mediastinal contours are within normal limits. Both lungs are clear. The visualized skeletal structures are unremarkable.  IMPRESSION: No active disease.   Electronically Signed   By: Lucienne Capers M.D.   On: 01/04/2015 04:42     EKG Interpretation None      MDM   Final diagnoses:  Hypertensive urgency, malignant    This a 45 year old female who presents with headache in the setting of hypertension. Also reports shortness of breath. Nontoxic on exam. Nonfocal.  Initial blood pressure 210/77.  Patient reports she took her metoprolol at approximately 8 PM. Heart rate in the 60s. No additional room for beta blockade. Patient was given 5 mg IV hydralazine. Basic lab work, CT scan, and chest x-ray obtained. CT shows no acute bleed. Portable chest is reassuring. Basic labwork is at the patient's baseline. Patient's blood pressure did not respond to IV hydralazine.  Patient was started on a nicardipine drip. She continues to have headache. Blood pressure continues to be [id number] systolic. Will admit for hypertensive urgency.      Merryl Hacker, MD 01/04/15 [phone]  Discussed with Dr. Blaine Hamper who has accepted the patient to the stepdown unit.  Merryl Hacker, MD 01/04/15 [phone]

## 2015-01-04 NOTE — Progress Notes (Signed)
Blood pressure now much better controlled with combination two oral agents. Prn IV hydralazine ordered. Once Cardene infusion discontinued plan transfer to telemetry unit-orders placed in Navigator.  Erin Hearing, ANP

## 2015-01-04 NOTE — ED Notes (Signed)
Pt c/o high BP, noticed high BP today during dialysis treatment, pt does own dialysis treatments at home five times a week. C/o headache 8/10, blurry vision.

## 2015-01-04 NOTE — Consult Note (Signed)
Renal Service Consult Note [facility]  Cynthia [name] 01/04/2015 Roney Jaffe D Requesting Physician:  Dr Conley Canal    Reason for Consult:  ESRD patient with HTN'sive urgency HPI: The patient is a 44 y.o. year-old with hx of DM2, HTN, ESRD on home HD who presented to ED reporting high BP's which were noted during HD and also headaches. Headaches going on for a week, has occ HA but not a chronic issue.  Is bilateral, somewhat throbbing, no photophobia/ nausea/ vomiting.  No visual defects.   On HD for about 4 years. Had several admission for CP and one for HTN urgency here in 2014.  Last admit had heart cath in 2015 which showed no sig obst CAD.     Chart review: 6/14 - HTN urgency, esrd, HTN, on HD. N/V d/t missed HD , uremia. Rx with IV bp meds, HD 8/14 - near syncope, vol depletion ESRD, HTN, DM2  > rx'd with IVF's, holding BP meds, added back gradually 2/15 - SOB/CP with HD > admit chest pain, ruled out for MI. Myoview negative. CP resolved.  5/15 - esrd, DM/HTN with SOB/ acute pulm edema, rx w consecutive HD 10/15 - chest pain > ruled out for MI. Coronary CT done then heart cath which showed calcified nonobstructive CAD. ESRD got HD in hospital.    ROS  no CP, cough, SOB  no abd pain, n/v/d  no joint pain   no dysuria  no rash  Past Medical History  Past Medical History  Diagnosis Date  . Hypertension   . ESRD on hemodialysis     Home HD 5x per week  . Depression   . Insulin-dependent diabetes mellitus with renal complications     INSULIN DEPENDENT  . GERD (gastroesophageal reflux disease)   . Neuromuscular disorder     NEUROPATHY  . History of blood transfusion     transfusion reaction  . Anemia    Past Surgical History  Past Surgical History  Procedure Laterality Date  . Cholecystectomy    . Abdominal hysterectomy    . Breast surgery      biopsy bilateral  . Tubal ligation    . Dialysis fistula creation Left   . Eye surgery Bilateral    lazer  . Revison of arteriovenous fistula Left 09/28/2013    Procedure: EXCISE ESCHAR LEFT ARM  ARTERIOVENOUS FISTULA WITH PLICATION OF LEFT ARM ARTERIOVENOUS FISTULA;  Surgeon: Elam Dutch, MD;  Location: [place];  Service: Vascular;  Laterality: Left;  . Left heart catheterization with coronary angiogram N/A 03/30/2014    Procedure: LEFT HEART CATHETERIZATION WITH CORONARY ANGIOGRAM;  Surgeon: Sinclair Grooms, MD;  Location: [facility] CATH LAB;  Service: Cardiovascular;  Laterality: N/A;   Family History  Family History  Problem Relation Age of Onset  . Cancer Maternal Grandmother   . Diabetes Maternal Grandfather   . Diabetes Paternal Grandmother   . Diabetes Mother   . Kidney disease Mother   . Diabetes Father   . Heart disease Father   . Kidney disease Father   . Diabetes Sister   . Asthma Sister   . Lupus Sister   . Diabetes Brother    Social History  reports that she has never smoked. She has never used smokeless tobacco. She reports that she does not drink alcohol or use illicit drugs. Allergies No Known Allergies Home medications Prior to Admission medications   Medication Sig Start Date End Date Taking? Authorizing Provider  aspirin EC 81  MG EC tablet Take 1 tablet (81 mg total) by mouth daily. 03/31/14  Yes Katheren Shams, DO  calcitRIOL (ROCALTROL) 0.5 MCG capsule Take 0.5 mcg by mouth daily.   Yes Historical Provider, MD  docusate sodium (COLACE) 100 MG capsule Take 100 mg by mouth 2 (two) times daily.   Yes Historical Provider, MD  FLUoxetine (PROZAC) 20 MG tablet Take 20 mg by mouth daily.   Yes Historical Provider, MD  Insulin Glargine (LANTUS SOLOSTAR) 100 UNIT/ML Solostar Pen Inject 10 Units into the skin at bedtime.   Yes Historical Provider, MD  Lanthanum Carbonate 1000 MG PACK Take 1,000 mg by mouth 3 (three) times daily with meals.    Yes Historical Provider, MD  metoCLOPramide (REGLAN) 5 MG tablet Take 5 mg by mouth 3 (three) times daily.   Yes Historical Provider,  MD  metoprolol (LOPRESSOR) 50 MG tablet Take 1 tablet (50 mg total) by mouth 2 (two) times daily. 03/31/14  Yes Katheren Shams, DO  multivitamin (RENA-VIT) TABS tablet Take 1 tablet by mouth daily.   Yes Historical Provider, MD  sevelamer (RENVELA) 800 MG tablet Take 800-4,000 mg by mouth 3 (three) times daily with meals. Take [phone] with snacks and 4000 with meals   Yes Historical Provider, MD   Liver Function Tests No results for input(s): AST, ALT, ALKPHOS, BILITOT, PROT, ALBUMIN in the last 168 hours. No results for input(s): LIPASE, AMYLASE in the last 168 hours. CBC  Recent Labs Lab 01/04/15 0411  WBC 5.3  NEUTROABS 3.1  HGB 10.2*  HCT 29.7*  MCV 93.7  PLT [id number]   Basic Metabolic Panel  Recent Labs Lab 01/04/15 0411  NA 134*  K 4.2  CL 93*  CO2 29  GLUCOSE 167*  BUN 36*  CREATININE 8.10*  CALCIUM 9.5    Filed Vitals:   01/04/15 0630 01/04/15 0635 01/04/15 0730 01/04/15 0745  BP: 152/61 151/61 143/51 143/53  Pulse: 64 66 65 65  Temp:      TempSrc:      Resp: 9 11 12 9   Height:      Weight:      SpO2: 97% 95% 95% 94%   Exam Alert, has a HA, holding her head, no distress, calm  158/50 bp No rash, cyanosis or gangrene Sclera anicteric, throat clear No jvd Chest clear to bases bilat RRR soft SEM no RG Abd soft NTND no mass or ascites GU deferred Ext no LE or UE edema, good pedal pulses Neuro is nf, Ox 3 LUA AVF +bruit  Nxt Stage home HD - MTWThFri  1K bath  4000 bfr   79 kg  Heparin 2000  LUA AVF No meds Pth 216, tsat 65%   Assessment: 1. HTN'sive urgency - no vol excess, on MTP only at home. Agree with trying another agent (d/w primary team). May have some subclinical vol excess that we can address with HD tomorrow.  2. Headache - not a chronic issue per pt 3. ESRD will do MWF HD while here, 4hr 4. DM2 on insulin 5. HTN on MTP, meds to be adjusted 6. Anemia Hb 10, no esa 7. MBD cont renvela/lanthanum   Plan - HD tomorrow, prn percocet  Kelly Splinter MD (pgr) [phone]    ([phone] 01/04/2015, 8:38 AM

## 2015-01-04 NOTE — Progress Notes (Signed)
Patient transferred to 2 Heart CCU bed 3. Patient unable to go to 2 [facility] as planned due to Cardene drip. Report given at bedside by ER Nurse.   No complaints from patient. Patient resting comfortably. Cardene currently at 5 mcg.   Will titrate drip accordingly.   BP 143/53 mmHg  Pulse 65  Temp(Src) 98.2 F (36.8 C) (Oral)  Resp 9  Ht 5\' 1"  (1.549 m)  Wt 78.801 kg (173 lb 11.6 oz)  BMI 32.84 kg/m2  SpO2 94%

## 2015-01-04 NOTE — Progress Notes (Signed)
Oral BP agents given. BP stable. Will titrate off Cardene drip (already on lowest dose) and transfer patient to tele per orders.   BP 137/52 mmHg  Pulse 65  Temp(Src) 98.2 F (36.8 C) (Oral)  Resp 9  Ht 5\' 1"  (1.549 m)  Wt 78.801 kg (173 lb 11.6 oz)  BMI 32.84 kg/m2  SpO2 94%   Cynthia [name]

## 2015-01-04 NOTE — H&P (Addendum)
[facility] History and Physical                                                                                    Cynthia [name], is a 44 y.o. female  MRN: [id number]   DOB - [birth year]  Admit Date - 01/04/2015  Outpatient Primary MD for the patient is [name], MD  Referring MD: Dina Rich / ER  Consulting M.D: Jonnie Finner / Nephrology  With History of -  Past Medical History  Diagnosis Date  . Hypertension   . ESRD on hemodialysis     Home HD 5x per week  . Depression   . Insulin-dependent diabetes mellitus with renal complications     INSULIN DEPENDENT  . GERD (gastroesophageal reflux disease)   . Neuromuscular disorder     NEUROPATHY  . History of blood transfusion     transfusion reaction  . Anemia       Past Surgical History  Procedure Laterality Date  . Cholecystectomy    . Abdominal hysterectomy    . Breast surgery      biopsy bilateral  . Tubal ligation    . Dialysis fistula creation Left   . Eye surgery Bilateral     lazer  . Revison of arteriovenous fistula Left 09/28/2013    Procedure: EXCISE ESCHAR LEFT ARM  ARTERIOVENOUS FISTULA WITH PLICATION OF LEFT ARM ARTERIOVENOUS FISTULA;  Surgeon: Elam Dutch, MD;  Location: [place];  Service: Vascular;  Laterality: Left;  . Left heart catheterization with coronary angiogram N/A 03/30/2014    Procedure: LEFT HEART CATHETERIZATION WITH CORONARY ANGIOGRAM;  Surgeon: Sinclair Grooms, MD;  Location: [facility] CATH LAB;  Service: Cardiovascular;  Laterality: N/A;    in for   Chief Complaint  Patient presents with  . Hypertension     HPI This is a 44 year female patient with known chronic kidney disease on home dialysis 5 days per week, hypertension, and diabetes on insulin. Patient reported that her blood pressures have been high for over one week. She's been taking metoprolol for about 3 months. She's had a waxing and waning headache that became worse on the evening of presentation with a level of 8/10 she  dialyzed for 2 hours the previous day on 7/11. She took a dose of metoprolol at 8 PM without improvement in her symptoms.   upon presentation to the ER she was afebrile. Blood pressure was elevated at 208/74, heart rate was 68, respirations 13, room air saturations were 98%. CT the head without contrast showed no acute intracranial abnormalities. Portable chest x-ray was clear without evidence of edema. Patient was started on Cardene infusion by the emergency room physician with improvement in her blood pressure but patient still reported headache. In further discussion with the patient after she was admitted to 2H03 she continues to report headache despite having a normalized blood pressure on Cardene infusion. She confirmed she had been taking her metoprolol as prescribed but had been having issues for about a week to 1-1/2 weeks with uncontrolled blood pressure. She did admit to eating several hotdogs last week. She has been having blurry vision as well as social with  a headache. She confirmed she completed 2 hours of dialysis on yesterday noting that 2 hours as a typical run for her 5 days per week. Her daughter who was translating for the patient since the patient speaks minimal English reports that the patient has no typical dialysis days she just dialyzes at least 5 out of 7 days per week.   Review of Systems   In addition to the HPI above,  No Fever-chills, myalgias or other constitutional symptoms No changes with hearing, new weakness, tingling, numbness in any extremity, No problems swallowing food or Liquids, indigestion/reflux No Chest pain, Cough or Shortness of Breath, palpitations, orthopnea or DOE No Abdominal pain, N/V; no melena or hematochezia, no dark tarry stools, Bowel movements are regular, No dysuria, hematuria or flank pain No new skin rashes, lesions, masses or bruises, No new joints pains-aches No recent weight gain or loss No polyuria, polydypsia or polyphagia,  *A full  10 point Review of Systems was done, except as stated above, all other Review of Systems were negative.  Social History History  Substance Use Topics  . Smoking status: Never Smoker   . Smokeless tobacco: Never Used  . Alcohol Use: No    Resides at: Private residence   Lives with: Daughters  Ambulatory status: without assistive devices    Family History Family History  Problem Relation Age of Onset  . Cancer Maternal Grandmother   . Diabetes Maternal Grandfather   . Diabetes Paternal Grandmother   . Diabetes Mother   . Kidney disease Mother   . Diabetes Father   . Heart disease Father   . Kidney disease Father   . Diabetes Sister   . Asthma Sister   . Lupus Sister   . Diabetes Brother      Prior to Admission medications   Medication Sig Start Date End Date Taking? Authorizing Provider  aspirin EC 81 MG EC tablet Take 1 tablet (81 mg total) by mouth daily. 03/31/14  Yes Katheren Shams, DO  calcitRIOL (ROCALTROL) 0.5 MCG capsule Take 0.5 mcg by mouth daily.   Yes Historical Provider, MD  docusate sodium (COLACE) 100 MG capsule Take 100 mg by mouth 2 (two) times daily.   Yes Historical Provider, MD  FLUoxetine (PROZAC) 20 MG tablet Take 20 mg by mouth daily.   Yes Historical Provider, MD  Insulin Glargine (LANTUS SOLOSTAR) 100 UNIT/ML Solostar Pen Inject 10 Units into the skin at bedtime.   Yes Historical Provider, MD  Lanthanum Carbonate 1000 MG PACK Take 1,000 mg by mouth 3 (three) times daily with meals.    Yes Historical Provider, MD  metoCLOPramide (REGLAN) 5 MG tablet Take 5 mg by mouth 3 (three) times daily.   Yes Historical Provider, MD  metoprolol (LOPRESSOR) 50 MG tablet Take 1 tablet (50 mg total) by mouth 2 (two) times daily. 03/31/14  Yes Katheren Shams, DO  multivitamin (RENA-VIT) TABS tablet Take 1 tablet by mouth daily.   Yes Historical Provider, MD  sevelamer (RENVELA) 800 MG tablet Take 800-4,000 mg by mouth 3 (three) times daily with meals. Take [phone]  with snacks and 4000 with meals   Yes Historical Provider, MD    No Known Allergies  Physical Exam  Vitals  Blood pressure 143/53, pulse 65, temperature 98.2 F (36.8 C), temperature source Oral, resp. rate 9, height 5\' 1"  (1.549 m), weight 173 lb 11.6 oz (78.801 kg), SpO2 94 %.   General:  In no acute distress, appears healthy and well nourished  Psych:  Normal affect, Denies Suicidal or Homicidal ideations, Awake Alert, Oriented X 3. Speech and thought patterns are clear and appropriate, no apparent short term memory deficits  Neuro:   No focal neurological deficits, CN II through XII intact, Strength 5/5 all 4 extremities, Sensation intact all 4 extremities.  ENT:  Ears and Eyes appear Normal, Conjunctivae clear, PER. Moist oral mucosa without erythema or exudates.  Neck:  Supple, No lymphadenopathy appreciated  Respiratory:  Symmetrical chest wall movement, Good air movement bilaterally, CTAB. Room Air  Cardiac:  RRR, No Murmurs, no LE edema noted, no JVD, No carotid bruits, peripheral pulses palpable at 2+  Abdomen:  Positive bowel sounds, Soft, Non tender, Non distended,  No masses appreciated, no obvious hepatosplenomegaly  Skin:  No Cyanosis, Normal Skin Turgor, No Skin Rash or Bruise.  Extremities: Symmetrical without obvious trauma or injury,  no effusions.  Data Review  CBC  Recent Labs Lab 01/04/15 0411  WBC 5.3  HGB 10.2*  HCT 29.7*  PLT 172  MCV 93.7  MCH 32.2  MCHC 34.3  RDW 13.5  LYMPHSABS 1.6  MONOABS 0.4  EOSABS 0.2  BASOSABS 0.0    Chemistries   Recent Labs Lab 01/04/15 0411  NA 134*  K 4.2  CL 93*  CO2 29  GLUCOSE 167*  BUN 36*  CREATININE 8.10*  CALCIUM 9.5    estimated creatinine clearance is 8.2 mL/min (by C-G formula based on Cr of 8.1).  No results for input(s): TSH, T4TOTAL, T3FREE, THYROIDAB in the last 72 hours.  Invalid input(s): FREET3  Coagulation profile No results for input(s): INR, PROTIME in the last 168  hours.  No results for input(s): DDIMER in the last 72 hours.  Cardiac Enzymes  Recent Labs Lab 01/04/15 0411  TROPONINI <0.03    Invalid input(s): POCBNP  Urinalysis    Component Value Date/Time   COLORURINE YELLOW 05/15/2013 1100   APPEARANCEUR CLOUDY* 05/15/2013 1100   LABSPEC 1.020 05/15/2013 1100   PHURINE 8.5* 05/15/2013 1100   GLUCOSEU 250* 05/15/2013 1100   HGBUR SMALL* 05/15/2013 1100   BILIRUBINUR NEGATIVE 05/15/2013 1100   KETONESUR NEGATIVE 05/15/2013 1100   PROTEINUR >300* 05/15/2013 1100   UROBILINOGEN 0.2 05/15/2013 1100   NITRITE NEGATIVE 05/15/2013 1100   LEUKOCYTESUR NEGATIVE 05/15/2013 1100    Imaging results:   Ct Head Wo Contrast  01/04/2015   CLINICAL DATA:  Headaches for 1 week.  EXAM: CT HEAD WITHOUT CONTRAST  TECHNIQUE: Contiguous axial images were obtained from the base of the skull through the vertex without intravenous contrast.  COMPARISON:  MRI brain 07/02/2013  FINDINGS: Ventricles and sulci are symmetrical. No mass effect or midline shift. No abnormal extra-axial fluid collections. Gray-white matter junctions are distinct. Basal cisterns are not effaced. No evidence of acute intracranial hemorrhage. No depressed skull fractures. Visualized paranasal sinuses and mastoid air cells are not opacified. Focal cystic lesion in the subcutaneous fat over the C1 spinous process and measuring about 1.8 cm diameter. This probably represents a sebaceous cyst.  IMPRESSION: No acute intracranial abnormalities.   Electronically Signed   By: Lucienne Capers M.D.   On: 01/04/2015 04:38   Dg Chest Portable 1 View  01/04/2015   CLINICAL DATA:  Chest tightness and shortness of breath tonight.  EXAM: PORTABLE CHEST - 1 VIEW  COMPARISON:  None.  FINDINGS: The heart size and mediastinal contours are within normal limits. Both lungs are clear. The visualized skeletal structures are unremarkable.  IMPRESSION: No active disease.  Electronically Signed   By: Lucienne Capers M.D.   On: 01/04/2015 04:42     EKG: (Independently reviewed) sinus rhythm, somewhat peaked T waves in V3 and V4, no ST segment or T-wave changes concerning for skinny, QTC 489 ms    Assessment & Plan  Principal Problem:   Hypertensive urgency -Admit to ICU with plans to transfer out to telemetry once Cardene infusion weaned and discontinued Continue metoprolol. Add ACE I.  And monitor potassium.  PRN IV hydralazine  Active Problems:   Insulin-dependent diabetes mellitus with renal complications -Continue home Lantus -ck CBGs AC/HS and provide sliding scale coverage -Check hemoglobin A1c    ESRD on dialysis -Nephrology aware of admission -No clinical signs of volume overload so plan dialysis on 7/13    Anemia in CKD -Hemoglobin stable at 10 -Currently not requiring erythropoietin agents  HA: likely from uncontrolled hypertension. CT brain ok. Prn tylenol  DVT Prophylaxis: Lovenox  Family Communication:    daughter at bedside   Code Status:   full code   Condition:  stable   Discharge disposition: anticipate discharge to home once blood pressure adequately controlled  Time spent in minutes : 60      ELLIS,ALLISON L. ANP on 01/04/2015 at 9:29 AM  Between 7am to 7pm - Pager - [phone]  After 7pm go to [email or web address] - password [email or web address]  And look for the night coverage person covering me after hours  [facility]  Attending note:   Chart reviewed. Patient examined.  Above note amended.  Optimize blood pressure control. Wean cardene and move out of ICU.  Nephrology consulted.     Doree Barthel, MD [facility]

## 2015-01-04 NOTE — Progress Notes (Signed)
Utilization Review Completed.Donne Anon T7/05/2015

## 2015-01-05 LAB — BASIC METABOLIC PANEL
ANION GAP: 12 (ref 5–15)
BUN: 46 mg/dL — ABNORMAL HIGH (ref 6–20)
CO2: 31 mmol/L (ref 22–32)
Calcium: 9.2 mg/dL (ref 8.9–10.3)
Chloride: 91 mmol/L — ABNORMAL LOW (ref 101–111)
Creatinine, Ser: 10.47 mg/dL — ABNORMAL HIGH (ref 0.44–1.00)
GFR calc Af Amer: 5 mL/min — ABNORMAL LOW (ref >60)
GFR calc non Af Amer: 4 mL/min — ABNORMAL LOW (ref >60)
Glucose, Bld: 111 mg/dL — ABNORMAL HIGH (ref 65–99)
POTASSIUM: 4.8 mmol/L (ref 3.5–5.1)
Sodium: 134 mmol/L — ABNORMAL LOW (ref 135–145)

## 2015-01-05 LAB — GLUCOSE, CAPILLARY
GLUCOSE-CAPILLARY: 116 mg/dL — AB (ref 65–99)
Glucose-Capillary: 114 mg/dL — ABNORMAL HIGH (ref 65–99)
Glucose-Capillary: 167 mg/dL — ABNORMAL HIGH (ref 65–99)

## 2015-01-05 LAB — HEMOGLOBIN A1C
Hgb A1c MFr Bld: 6.6 % — ABNORMAL HIGH (ref 4.8–5.6)
MEAN PLASMA GLUCOSE: 143 mg/dL

## 2015-01-05 MED ORDER — SODIUM CHLORIDE 0.9 % IV SOLN: 100.0000 mL | INTRAVENOUS | Status: DC | PRN

## 2015-01-05 MED ORDER — LIDOCAINE-PRILOCAINE 2.5-2.5 % EX CREA
1.0000 "application " | TOPICAL_CREAM | CUTANEOUS | Status: DC | PRN
  Filled 2015-01-05: qty 5

## 2015-01-05 MED ORDER — ALTEPLASE 2 MG IJ SOLR
2.0000 mg | Freq: Once | INTRAMUSCULAR | Status: DC | PRN
  Filled 2015-01-05: qty 2

## 2015-01-05 MED ORDER — HEPARIN SODIUM (PORCINE) 1000 UNIT/ML DIALYSIS: 1000.0000 [IU] | INTRAMUSCULAR | Status: DC | PRN

## 2015-01-05 MED ORDER — LIDOCAINE HCL (PF) 1 % IJ SOLN
5.0000 mL | INTRAMUSCULAR | Status: DC | PRN
Start: 2015-01-05 — End: 2015-01-05

## 2015-01-05 MED ORDER — NEPRO/CARBSTEADY PO LIQD
237.0000 mL | ORAL | Status: DC | PRN
  Filled 2015-01-05: qty 237

## 2015-01-05 MED ORDER — HEPARIN SODIUM (PORCINE) 1000 UNIT/ML DIALYSIS: 2000.0000 [IU] | Freq: Once | INTRAMUSCULAR | Status: DC

## 2015-01-05 MED ORDER — PENTAFLUOROPROP-TETRAFLUOROETH EX AERO: 1.0000 "application " | INHALATION_SPRAY | CUTANEOUS | Status: DC | PRN

## 2015-01-05 NOTE — Progress Notes (Signed)
Report already called to Tai, RN on 3E.  Pt currently in HD, HD RN aware of room assignment.  Pt's belongings from room taken to 3E06, clothing and shoes in a bag.  Pt's purse was also in the room and appeared to have a wallet in it.  I carried the purse up to HD and left it with the patient.  Pt also had cell phone with her when she went to HD this am.  Placed on 3E tele box and called CCMD.    Carol Ada, RN

## 2015-01-05 NOTE — Progress Notes (Signed)
[facility] TEAM 1 - Stepdown/ICU TEAM Progress Note  Cynthia [name] [id number] DOB: Apr 28, 1971 DOA: 01/04/2015 PCP: [name], MD  Admit HPI / Brief Narrative: 44 yo female with ESRD on home dialysis 5 days per week, hypertension, and diabetes on insulin. Patient reported that her blood pressures had been high for over one week. She'd been taking metoprolol for about 3 months. She'd had a waxing and waning headache that became worse on the evening of presentation.  She took a dose of metoprolol at 8 PM without improvement in her symptoms.  Upon presentation to the ER her BP was 208/74. CT head showed no acute intracranial abnormalities. Portable chest x-ray was without evidence of edema. Patient was started on Cardene infusion by the emergency room physician with improvement in her blood pressure but patient still reported headache.  HPI/Subjective: The patient states that she continues to have a low-grade headache but that it has improved in general.  She now complains of vomiting postprandial.  She denies nausea preprandial or even post vomiting.  She denies chest pain or shortness of breath.  She reports that she does intermittently vomit at home unexpectedly but that since her admission this vomiting has happened consistently with every attempt to eat.  Assessment/Plan:  Hypertensive urgency Blood pressure much better controlled - off Cardene drip - continue current oral medications and follow trend - suspect dietary indiscretion as etiology  Insulin-dependent diabetes mellitus with renal complications [id number] pending - CBGs currently well controlled  Postprandial vomiting Symptoms are suggestive of possible gastroparesis - we will check a nuclear medicine gastric emptying scan  ESRD on dialysis  Nephrology following and attending to dialysis  Anemia in CKD Hemoglobin stable at 10  Soft holosystolic murmur This was appreciated on exam today - review of records reveals this was  present in October 2015 - it appears a TTE was scheduled for that admission but was not completed for unclear reasons - TTE will be ordered to assess her murmur  HA likely from uncontrolled hypertension - CT brain ok  Code Status: FULL Family Communication: no family present at time of exam Disposition Plan: Telemetry bed - follow ability to tolerate oral intake and blood pressure  Consultants: Nephrology   Procedures: Nuclear medicine gastric emptying scan - pending TTE - pending   Antibiotics: none  DVT prophylaxis: SQ heparin   Objective: Blood pressure 150/56, pulse 75, temperature 98.4 F (36.9 C), temperature source Oral, resp. rate 16, height 5\' 1"  (1.549 m), weight 77.656 kg (171 lb 3.2 oz), SpO2 98 %.  Intake/Output Summary (Last 24 hours) at 01/05/15 1503 Last data filed at 01/05/15 1400  Gross per 24 hour  Intake    220 ml  Output   2850 ml  Net  -2630 ml   Exam: General: No acute respiratory distress Lungs: Clear to auscultation bilaterally without wheezes or crackles Cardiovascular: Regular rate and rhythm with 2/6 holosystolic M Abdomen: Nontender, nondistended, soft, bowel sounds positive, no rebound, no ascites, no appreciable mass Extremities: No significant cyanosis, clubbing, or edema bilateral lower extremities  Data Reviewed: Basic Metabolic Panel:  Recent Labs Lab 01/04/15 0411 01/05/15 0223  NA 134* 134*  K 4.2 4.8  CL 93* 91*  CO2 29 31  GLUCOSE 167* 111*  BUN 36* 46*  CREATININE 8.10* 10.47*  CALCIUM 9.5 9.2    CBC:  Recent Labs Lab 01/04/15 0411  WBC 5.3  NEUTROABS 3.1  HGB 10.2*  HCT 29.7*  MCV 93.7  PLT 172  Liver Function Tests: No results for input(s): AST, ALT, ALKPHOS, BILITOT, PROT, ALBUMIN in the last 168 hours. No results for input(s): LIPASE, AMYLASE in the last 168 hours. No results for input(s): AMMONIA in the last 168 hours.   Cardiac Enzymes:  Recent Labs Lab 01/04/15 0411  TROPONINI <0.03     CBG:  Recent Labs Lab 01/04/15 1337 01/04/15 1720 01/04/15 2112 01/05/15 0741  GLUCAP 144* 153* 83 116*    Recent Results (from the past 240 hour(s))  MRSA PCR Screening     Status: None   Collection Time: 01/04/15  7:52 AM  Result Value Ref Range Status   MRSA by PCR NEGATIVE NEGATIVE Final    Comment:        The GeneXpert MRSA Assay (FDA approved for NASAL specimens only), is one component of a comprehensive MRSA colonization surveillance program. It is not intended to diagnose MRSA infection nor to guide or monitor treatment for MRSA infections.      Studies:   Recent x-ray studies have been reviewed in detail by the Attending Physician  Scheduled Meds:  Scheduled Meds: . aspirin EC  81 mg Oral Daily  . calcitRIOL  0.5 mcg Oral Daily  . docusate sodium  100 mg Oral BID  . FLUoxetine  20 mg Oral Daily  . heparin subcutaneous  5,000 Units Subcutaneous 3 times per day  . insulin aspart  0-15 Units Subcutaneous TID WC  . insulin aspart  0-5 Units Subcutaneous QHS  . insulin glargine  10 Units Subcutaneous QHS  . lanthanum  1,000 mg Oral TID WC  . lisinopril  5 mg Oral Daily  . metoCLOPramide  5 mg Oral TID  . metoprolol tartrate  50 mg Oral BID  . multivitamin  1 tablet Oral Daily  . sevelamer carbonate  4,000 mg Oral TID WC  . sevelamer carbonate  800-1,600 mg Oral With snacks  . sodium chloride  3 mL Intravenous Q12H    Time spent on care of this patient: 35 mins   MCCLUNG,JEFFREY T , MD   [facility] Office  [phone] Pager - Text Page per Shea Evans as per below:  On-Call/Text Page:      [email or web address]      password [email or web address]  If 7PM-7AM, please contact night-coverage [email or web address] Password [email or web address] 01/05/2015, 3:03 PM   LOS: 1 day

## 2015-01-05 NOTE — Progress Notes (Signed)
  [facility] Progress Note   Subjective: HA's are better, not completely gone, no new problems.   Filed Vitals:   01/05/15 1000 01/05/15 1030 01/05/15 1100 01/05/15 1130  BP: 102/39 131/64 118/56 103/63  Pulse: 69 69 71 73  Temp:      TempSrc:      Resp:   17 12  Height:      Weight:      SpO2:       Exam: Alert, calm, BP 118/56 No jvd Chest clear to bases bilat RRR soft SEM no RG Abd soft NTND no mass or ascites GU deferred Ext no LE or UE edema, good pedal pulses Neuro is nf, Ox 3 LUA AVF +bruit  Nxt Stage home HD - MTWThFri 1K bath 4000 bfr 79 kg Heparin 2000 LUA AVF No meds Pth 216, tsat 65%   Assessment: 1. HTN'sive urgency - lisinopril added to MTP. BP down on HD today.  2. ESRD MWF HD while here, 4hr 3. DM2 on insulin 4. Anemia Hb 10, no esa 5. MBD cont renvela/lanthanum  Plan - HD today, UF 2- 2.5 kg     Kelly Splinter MD  pager [phone]    cell [phone]  01/05/2015, 11:51 AM     Recent Labs Lab 01/04/15 0411 01/05/15 0223  NA 134* 134*  K 4.2 4.8  CL 93* 91*  CO2 29 31  GLUCOSE 167* 111*  BUN 36* 46*  CREATININE 8.10* 10.47*  CALCIUM 9.5 9.2   No results for input(s): AST, ALT, ALKPHOS, BILITOT, PROT, ALBUMIN in the last 168 hours.  Recent Labs Lab 01/04/15 0411  WBC 5.3  NEUTROABS 3.1  HGB 10.2*  HCT 29.7*  MCV 93.7  PLT 172   . aspirin EC  81 mg Oral Daily  . calcitRIOL  0.5 mcg Oral Daily  . docusate sodium  100 mg Oral BID  . FLUoxetine  20 mg Oral Daily  . heparin subcutaneous  5,000 Units Subcutaneous 3 times per day  . insulin aspart  0-15 Units Subcutaneous TID WC  . insulin aspart  0-5 Units Subcutaneous QHS  . insulin glargine  10 Units Subcutaneous QHS  . lanthanum  1,000 mg Oral TID WC  . lisinopril  5 mg Oral Daily  . metoCLOPramide  5 mg Oral TID  . metoprolol tartrate  50 mg Oral BID  . multivitamin  1 tablet Oral Daily  . sevelamer carbonate  4,000 mg Oral TID WC  . sevelamer  carbonate  800-1,600 mg Oral With snacks  . sodium chloride  3 mL Intravenous Q12H   . niCARDipine Stopped (01/04/15 1200)   acetaminophen **OR** acetaminophen, hydrALAZINE, ondansetron **OR** ondansetron (ZOFRAN) IV, oxyCODONE-acetaminophen

## 2015-01-06 ENCOUNTER — Inpatient Hospital Stay (HOSPITAL_COMMUNITY): Payer: Medicare Other

## 2015-01-06 DIAGNOSIS — N186 End stage renal disease: Secondary | ICD-10-CM

## 2015-01-06 DIAGNOSIS — I10 Essential (primary) hypertension: Secondary | ICD-10-CM

## 2015-01-06 DIAGNOSIS — D631 Anemia in chronic kidney disease: Secondary | ICD-10-CM

## 2015-01-06 DIAGNOSIS — N189 Chronic kidney disease, unspecified: Secondary | ICD-10-CM

## 2015-01-06 DIAGNOSIS — R011 Cardiac murmur, unspecified: Secondary | ICD-10-CM

## 2015-01-06 DIAGNOSIS — E1129 Type 2 diabetes mellitus with other diabetic kidney complication: Secondary | ICD-10-CM

## 2015-01-06 DIAGNOSIS — Z992 Dependence on renal dialysis: Secondary | ICD-10-CM

## 2015-01-06 LAB — CBC
HCT: 31.7 % — ABNORMAL LOW (ref 36.0–46.0)
Hemoglobin: 10.5 g/dL — ABNORMAL LOW (ref 12.0–15.0)
MCH: 31.7 pg (ref 26.0–34.0)
MCHC: 33.1 g/dL (ref 30.0–36.0)
MCV: 95.8 fL (ref 78.0–100.0)
Platelets: 173 10*3/uL (ref 150–400)
RBC: 3.31 MIL/uL — ABNORMAL LOW (ref 3.87–5.11)
RDW: 13.8 % (ref 11.5–15.5)
WBC: 6 10*3/uL (ref 4.0–10.5)

## 2015-01-06 LAB — RENAL FUNCTION PANEL
Albumin: 3.6 g/dL (ref 3.5–5.0)
Anion gap: 13 (ref 5–15)
BUN: 31 mg/dL — ABNORMAL HIGH (ref 6–20)
CO2: 27 mmol/L (ref 22–32)
Calcium: 9.6 mg/dL (ref 8.9–10.3)
Chloride: 91 mmol/L — ABNORMAL LOW (ref 101–111)
Creatinine, Ser: 9.39 mg/dL — ABNORMAL HIGH (ref 0.44–1.00)
GFR calc Af Amer: 5 mL/min — ABNORMAL LOW (ref >60)
GFR calc non Af Amer: 4 mL/min — ABNORMAL LOW (ref >60)
Glucose, Bld: 161 mg/dL — ABNORMAL HIGH (ref 65–99)
Phosphorus: 5.3 mg/dL — ABNORMAL HIGH (ref 2.5–4.6)
Potassium: 4.2 mmol/L (ref 3.5–5.1)
Sodium: 131 mmol/L — ABNORMAL LOW (ref 135–145)

## 2015-01-06 LAB — GLUCOSE, CAPILLARY
GLUCOSE-CAPILLARY: 78 mg/dL (ref 65–99)
GLUCOSE-CAPILLARY: 91 mg/dL (ref 65–99)

## 2015-01-06 LAB — HEPATITIS B SURFACE ANTIGEN: HEP B S AG: NEGATIVE

## 2015-01-06 LAB — HEPATITIS B SURFACE ANTIBODY,QUALITATIVE: HEP B S AB: REACTIVE

## 2015-01-06 MED ORDER — LABETALOL HCL 100 MG PO TABS
100.0000 mg | ORAL_TABLET | Freq: Two times a day (BID) | ORAL | Status: DC
  Administered 2015-01-06 – 2015-01-07 (×2): 100 mg via ORAL
  Filled 2015-01-06 (×3): qty 1

## 2015-01-06 MED ORDER — PENTAFLUOROPROP-TETRAFLUOROETH EX AERO: 1.0000 "application " | INHALATION_SPRAY | CUTANEOUS | Status: DC | PRN

## 2015-01-06 MED ORDER — SODIUM CHLORIDE 0.9 % IV SOLN: 100.0000 mL | INTRAVENOUS | Status: DC | PRN

## 2015-01-06 MED ORDER — PANTOPRAZOLE SODIUM 40 MG PO TBEC
40.0000 mg | DELAYED_RELEASE_TABLET | Freq: Every day | ORAL | Status: DC
  Administered 2015-01-07: 40 mg via ORAL
  Filled 2015-01-06: qty 1

## 2015-01-06 MED ORDER — LIDOCAINE-PRILOCAINE 2.5-2.5 % EX CREA
1.0000 "application " | TOPICAL_CREAM | CUTANEOUS | Status: DC | PRN
  Filled 2015-01-06: qty 5

## 2015-01-06 MED ORDER — TECHNETIUM TC 99M SULFUR COLLOID
2.0000 | Freq: Once | INTRAVENOUS | Status: AC | PRN
  Administered 2015-01-06: 2 via ORAL

## 2015-01-06 MED ORDER — LIDOCAINE HCL (PF) 1 % IJ SOLN: 5.0000 mL | INTRAMUSCULAR | Status: DC | PRN

## 2015-01-06 MED ORDER — HEPARIN SODIUM (PORCINE) 1000 UNIT/ML DIALYSIS
1000.0000 [IU] | INTRAMUSCULAR | Status: DC | PRN
  Filled 2015-01-06: qty 1

## 2015-01-06 MED ORDER — ALTEPLASE 2 MG IJ SOLR
2.0000 mg | Freq: Once | INTRAMUSCULAR | Status: AC | PRN
  Filled 2015-01-06: qty 2

## 2015-01-06 MED ORDER — NEPRO/CARBSTEADY PO LIQD
237.0000 mL | ORAL | Status: DC | PRN
  Filled 2015-01-06: qty 237

## 2015-01-06 NOTE — Progress Notes (Signed)
  [facility] Progress Note  Assessment/Plan: 1. Hypertensive urgency - BP improved with the addition of lisinopril to metoprolol; Net UF 2.5 Wed on HD- needs updated outpt med list at d/c; post HD wieght 77.6 (EDW 79); suspect needs lower edw at d/c; TTE ordered to assess murmur 2. ESRD -5 days a week Home HD - on MWF HD here - get standing weights Friday 3. Anemia - Hgb 10.2 down from 11 done earlier this month last tsat 65% - last received Mircera 200 6/8- repeat in AM, may need to resume ESA 4. Secondary hyperparathyroidism -iPTH 216 - on calcitriol 0.5 fosrenol and renvela  5. Nutrition - renal carb mod/vit 6. DM- per primary; had normal gastric emptying study today [place] [phone] 01/06/2015,2:44 PM  LOS: 2 days   Pt seen, examined and agree w A/P as above.  Kelly Splinter MD pager [phone]    cell [phone] 01/06/2015, 5:15 PM    Subjective:   Hungry - has slight H/A but maybe from not eating  Objective Filed Vitals:   01/05/15 1300 01/05/15 2132 01/06/15 0131 01/06/15 0634  BP: 150/56 138/54 128/60 139/54  Pulse: 75 79 66 65  Temp: 98.4 F (36.9 C) 98.7 F (37.1 C) 98.8 F (37.1 C) 98.3 F (36.8 C)  TempSrc: Oral Oral Oral Oral  Resp: 16 16 18 18   Height: 5\' 1"  (1.549 m)     Weight: 77.656 kg (171 lb 3.2 oz)   77.701 kg (171 lb 4.8 oz)  SpO2: 98% 98% 97% 98%   Physical Exam General:NAD Heart: RRR Lungs: no rales Abdomen: soft T Extremities: no edema Dialysis Access: left upper AVF, some what aneurysmal - + bruit  Dialysis Orders: Nxt Stage home HD - MTWThFri 1K bath 4000 bfr 79 kg Heparin 2000 LUA AVF No IV meds Pth 216, tsat 65%  Additional Objective Labs: Basic Metabolic Panel:  Recent Labs Lab 01/04/15 0411 01/05/15 0223  NA 134* 134*  K 4.2 4.8  CL 93* 91*  CO2 29 31  GLUCOSE 167* 111*  BUN 36* 46*  CREATININE 8.10* 10.47*  CALCIUM 9.5 9.2    Recent Labs Lab  01/04/15 0411  WBC 5.3  NEUTROABS 3.1  HGB 10.2*  HCT 29.7*  MCV 93.7  PLT 172  Cardiac Enzymes:  Recent Labs Lab 01/04/15 0411  TROPONINI <0.03   CBG:  Recent Labs Lab 01/04/15 2112 01/05/15 0741 01/05/15 1729 01/05/15 2131 01/06/15 0638  GLUCAP 83 116* 114* 167* 78   Medications:   . aspirin EC  81 mg Oral Daily  . calcitRIOL  0.5 mcg Oral Daily  . docusate sodium  100 mg Oral BID  . FLUoxetine  20 mg Oral Daily  . heparin subcutaneous  5,000 Units Subcutaneous 3 times per day  . insulin aspart  0-15 Units Subcutaneous TID WC  . insulin aspart  0-5 Units Subcutaneous QHS  . insulin glargine  10 Units Subcutaneous QHS  . lanthanum  1,000 mg Oral TID WC  . lisinopril  5 mg Oral Daily  . metoprolol tartrate  50 mg Oral BID  . multivitamin  1 tablet Oral Daily  . sevelamer carbonate  4,000 mg Oral TID WC  . sevelamer carbonate  800-1,600 mg Oral With snacks  . sodium chloride  3 mL Intravenous Q12H

## 2015-01-06 NOTE — Progress Notes (Signed)
  Echocardiogram 2D Echocardiogram has been performed.  Jennette Dubin 01/06/2015, 10:56 AM

## 2015-01-06 NOTE — Progress Notes (Signed)
Progress Note  Cynthia [name] [id number] DOB: Sep 01, 1970 DOA: 01/04/2015 PCP: [name], MD  Admit HPI / Brief Narrative: 44 yo female with ESRD on home dialysis 5 days per week, hypertension, and diabetes on insulin. Patient reported that her blood pressures had been high for over one week. She'd been taking metoprolol for about 3 months. She'd had a waxing and waning headache that became worse on the evening of presentation.  She took a dose of metoprolol at 8 PM without improvement in her symptoms.  Upon presentation to the ER her BP was 208/74. CT head showed no acute intracranial abnormalities. Portable chest x-ray was without evidence of edema. Patient was started on Cardene infusion by the emergency room physician with improvement in her blood pressure but patient still reported headache.  HPI/Subjective: Patient is feeling better and reports been hungry. No CP, no SOB, BP improved.  Assessment/Plan:  Hypertensive urgency -Blood pressure much better controlled  -will substitute metoprolol for labetalol -continue added lisinopril for now -Follow VS -low sodium diet and adjustment on dry weight   Insulin-dependent diabetes mellitus with renal complications -123456 6.6 -continue current hypoglycemic regimen  Postprandial vomiting -Symptoms are suggestive of possible gastroparesis  -gastric emptying study neg -will add protonix for presumed underlying reflux  ESRD on dialysis  -Nephrology following and helping with dialysis  Anemia in CKD -Hemoglobin stable at 10 -epogen and IV iron as per renal discretion  Soft holosystolic murmur -Due to mitral regurgitation -no foramen ovale  HA -likely from uncontrolled hypertension - CT brain ok  Code Status: FULL Family Communication: daughters at bedside Disposition Plan: most likely home in am  Consultants: Nephrology   Procedures: Nuclear medicine gastric emptying scan - normal study   TTE -  - Left ventricle:  The cavity size was normal. Wall thickness was increased in a pattern of mild LVH. Systolic function was normal. The estimated ejection fraction was in the range of 55% to 60%. Wall motion was normal; there were no regional wall motion abnormalities. - Mitral valve: Calcified annulus. There was mild regurgitation. - Atrial septum: No defect or patent foramen ovale was identified.  Antibiotics: none  DVT prophylaxis: SQ heparin   Objective: Blood pressure 139/54, pulse 65, temperature 98.3 F (36.8 C), temperature source Oral, resp. rate 18, height 5\' 1"  (1.549 m), weight 77.701 kg (171 lb 4.8 oz), SpO2 98 %.  Intake/Output Summary (Last 24 hours) at 01/06/15 1728 Last data filed at 01/06/15 1108  Gross per 24 hour  Intake      0 ml  Output      0 ml  Net      0 ml   Exam: General: No acute respiratory distress, AAOX3, denies CP and SOB Lungs: Clear to auscultation bilaterally without wheezes or crackles Cardiovascular: Regular rate and rhythm with 2/6 holosystolic M Abdomen: Nontender, nondistended, soft, bowel sounds positive, no rebound, no ascites, no appreciable mass Extremities: No significant cyanosis, clubbing, or edema bilateral lower extremities  Data Reviewed: Basic Metabolic Panel:  Recent Labs Lab 01/04/15 0411 01/05/15 0223  NA 134* 134*  K 4.2 4.8  CL 93* 91*  CO2 29 31  GLUCOSE 167* 111*  BUN 36* 46*  CREATININE 8.10* 10.47*  CALCIUM 9.5 9.2    CBC:  Recent Labs Lab 01/04/15 0411  WBC 5.3  NEUTROABS 3.1  HGB 10.2*  HCT 29.7*  MCV 93.7  PLT 172    Cardiac Enzymes:  Recent Labs Lab 01/04/15 0411  TROPONINI <0.03  CBG:  Recent Labs Lab 01/05/15 0741 01/05/15 1729 01/05/15 2131 01/06/15 0638 01/06/15 1725  GLUCAP 116* 114* 167* 78 91    Recent Results (from the past 240 hour(s))  MRSA PCR Screening     Status: None   Collection Time: 01/04/15  7:52 AM  Result Value Ref Range Status   MRSA by PCR NEGATIVE  NEGATIVE Final    Comment:        The GeneXpert MRSA Assay (FDA approved for NASAL specimens only), is one component of a comprehensive MRSA colonization surveillance program. It is not intended to diagnose MRSA infection nor to guide or monitor treatment for MRSA infections.      Studies:   Recent x-ray studies have been reviewed in detail by the Attending Physician  Scheduled Meds:  Scheduled Meds: . aspirin EC  81 mg Oral Daily  . calcitRIOL  0.5 mcg Oral Daily  . docusate sodium  100 mg Oral BID  . FLUoxetine  20 mg Oral Daily  . heparin subcutaneous  5,000 Units Subcutaneous 3 times per day  . insulin aspart  0-15 Units Subcutaneous TID WC  . insulin aspart  0-5 Units Subcutaneous QHS  . insulin glargine  10 Units Subcutaneous QHS  . labetalol  100 mg Oral BID  . lanthanum  1,000 mg Oral TID WC  . lisinopril  5 mg Oral Daily  . multivitamin  1 tablet Oral Daily  . [START ON 01/07/2015] pantoprazole  40 mg Oral Q1200  . sevelamer carbonate  4,000 mg Oral TID WC  . sevelamer carbonate  800-1,600 mg Oral With snacks  . sodium chloride  3 mL Intravenous Q12H    Time spent on care of this patient: 35 mins   Barton Dubois , MD  [phone]   01/06/2015, 5:28 PM   LOS: 2 days

## 2015-01-06 NOTE — Care Management Important Message (Signed)
Important Message  Patient Details  Name: Cynthia [name] MRN: [id number] Date of Birth: 1971/05/04   Medicare Important Message Given:       Pricilla Handler 01/06/2015, 2:23 PM

## 2015-01-06 NOTE — Care Management Important Message (Signed)
Important Message  Patient Details  Name: Cynthia [name] MRN: [id number] Date of Birth: [birth year]   Medicare Important Message Given:       Pricilla Handler 01/06/2015, 2:25 PM

## 2015-01-07 DIAGNOSIS — R1110 Vomiting, unspecified: Secondary | ICD-10-CM

## 2015-01-07 LAB — GLUCOSE, CAPILLARY
GLUCOSE-CAPILLARY: 209 mg/dL — AB (ref 65–99)
Glucose-Capillary: 118 mg/dL — ABNORMAL HIGH (ref 65–99)
Glucose-Capillary: 79 mg/dL (ref 65–99)
Glucose-Capillary: 80 mg/dL (ref 65–99)
Glucose-Capillary: 165 mg/dL — ABNORMAL HIGH (ref 65–99)

## 2015-01-07 LAB — CBC
HCT: 30.5 % — ABNORMAL LOW (ref 36.0–46.0)
Hemoglobin: 10.3 g/dL — ABNORMAL LOW (ref 12.0–15.0)
MCH: 32.3 pg (ref 26.0–34.0)
MCHC: 33.8 g/dL (ref 30.0–36.0)
MCV: 95.6 fL (ref 78.0–100.0)
Platelets: 169 10*3/uL (ref 150–400)
RBC: 3.19 MIL/uL — ABNORMAL LOW (ref 3.87–5.11)
RDW: 13.7 % (ref 11.5–15.5)
WBC: 5.7 10*3/uL (ref 4.0–10.5)

## 2015-01-07 LAB — RENAL FUNCTION PANEL
Albumin: 3.4 g/dL — ABNORMAL LOW (ref 3.5–5.0)
Anion gap: 12 (ref 5–15)
BUN: 40 mg/dL — ABNORMAL HIGH (ref 6–20)
CO2: 29 mmol/L (ref 22–32)
Calcium: 9.6 mg/dL (ref 8.9–10.3)
Chloride: 90 mmol/L — ABNORMAL LOW (ref 101–111)
Creatinine, Ser: 11.08 mg/dL — ABNORMAL HIGH (ref 0.44–1.00)
GFR calc Af Amer: 4 mL/min — ABNORMAL LOW (ref >60)
GFR calc non Af Amer: 4 mL/min — ABNORMAL LOW (ref >60)
Glucose, Bld: 103 mg/dL — ABNORMAL HIGH (ref 65–99)
Phosphorus: 6 mg/dL — ABNORMAL HIGH (ref 2.5–4.6)
Potassium: 4.8 mmol/L (ref 3.5–5.1)
Sodium: 131 mmol/L — ABNORMAL LOW (ref 135–145)

## 2015-01-07 MED ORDER — LISINOPRIL 5 MG PO TABS: 10.0000 mg | ORAL_TABLET | Freq: Every day | ORAL | Status: DC

## 2015-01-07 MED ORDER — PANTOPRAZOLE SODIUM 40 MG PO TBEC: 40.0000 mg | DELAYED_RELEASE_TABLET | Freq: Every day | ORAL | Status: DC

## 2015-01-07 MED ORDER — LABETALOL HCL 100 MG PO TABS: 100.0000 mg | ORAL_TABLET | Freq: Two times a day (BID) | ORAL | Status: DC

## 2015-01-07 MED ORDER — METOCLOPRAMIDE HCL 5 MG PO TABS: 5.0000 mg | ORAL_TABLET | Freq: Three times a day (TID) | ORAL | Status: DC

## 2015-01-07 NOTE — Care Management Important Message (Signed)
Important Message  Patient Details  Name: Chaquana Leduff MRN: [id number] Date of Birth: 09/11/1970   Medicare Important Message Given:  Yes-second notification given    Pricilla Handler 01/07/2015, 11:36 AM

## 2015-01-07 NOTE — Discharge Summary (Signed)
Physician Discharge Summary  Cynthia [name] [id number] DOB: [birth year] DOA: 01/04/2015  PCP: [name], MD  Admit date: 01/04/2015 Discharge date: 01/07/2015  Time spent: >30 minutes  Recommendations for Outpatient Follow-up:  1. Reassess blood pressure and adjust antihypertensive medications as needed 2. Repeat basic metabolic panel to follow on patient's electrolytes 3. Repeat CBC to follow on patient hemoglobin trend  Discharge Diagnoses:  Principal Problem:   Hypertensive urgency Active Problems:   ESRD on dialysis   Insulin-dependent diabetes mellitus with renal complications   Anemia in CKD (chronic kidney disease) GERD Depression  Discharge Condition: Stable and improved. The patient has been discharged home with instructions to follow a low-sodium diet, to take medications as prescribed and to arrange follow-up with her PCP in 10 days.  Diet recommendation: Multiple small meals, low carbohydrates diet and low sodium diet  Filed Weights   01/06/15 0634 01/07/15 0508 01/07/15 0652  Weight: 77.701 kg (171 lb 4.8 oz) 77.973 kg (171 lb 14.4 oz) 77.9 kg (171 lb 11.8 oz)    History of present illness:  44 year female patient with known chronic kidney disease on home dialysis 5 days per week, hypertension, and diabetes on insulin. Patient reported that her blood pressures have been high for over one week. She's been taking metoprolol for about 3 months. She's had a waxing and waning headache that became worse on the evening of presentation with a level of 8/10 she dialyzed for 2 hours the previous day on 7/11. She took a dose of metoprolol at 8 PM without improvement in her symptoms. Upon presentation to the ER she was afebrile. Blood pressure was elevated at 208/74, heart rate was 68, respirations 13, room air saturations were 98%. CT the head without contrast showed no acute intracranial abnormalities. Portable chest x-ray was clear without evidence of edema. Patient was  admitted to the hospital for further evaluation and treatment.  Hospital Course:  Hypertensive urgency -Blood pressure Much improved and stable at discharge -Patient's metoprolol has been substituted for labetalol and following recommendations per renal service she was started on lisinopril for better blood pressure control -Follow VS and adjust medications further as needed during follow-up visit -low sodium diet and adjustment on dry weight is renal service feel appropriate  Insulin-dependent diabetes mellitus with renal complications -123456 6.6 -continue current hypoglycemic regimen  Postprandial vomiting -Symptoms are suggestive of possible gastroparesis  -gastric emptying study neg -will add protonix for presumed underlying reflux -Patient will continue using empirically therapy with Reglan 5 mg 3 times a day and has been advised to follow small meals multiple times a day to facilitate digestion and GI transit  ESRD on dialysis  -Will continue outpatient follow-up with her nephrologist and continue hemodialysis  Anemia in CKD -Hemoglobin stable at 10 -epogen and IV iron as per renal discretion -No signs of overt bleeding  Soft holosystolic murmur -Due to mitral regurgitation -no foramen ovale or any other abnormalities appreciated on exam  HA -likely from uncontrolled hypertension  - CT brain ok  Depression -No suicidal ideation or hallucinations -Mode is a stable. Will continue home medication regimen  Procedures:  Nuclear medicine gastric emptying scan - normal study    TTE -  - Left ventricle: The cavity size was normal. Wall thickness was increased in a pattern of mild LVH. Systolic function was normal. The estimated ejection fraction was in the range of 55% to 60%. Wall motion was normal; there were no regional wall motion abnormalities. - Mitral valve: Calcified  annulus. There was mild regurgitation. - Atrial septum: No defect or patent foramen  ovale was identified.  Consultations:  Renal service  Discharge Exam: Filed Vitals:   01/07/15 1143  BP: 141/51  Pulse: 70  Temp: 98.3 F (36.8 C)  Resp: 17    General: Afebrile, no chest pain, no shortness of breath. Denies any further headaches, nausea or active vomiting. Blood pressure is well controlled Cardiovascular: S1 and S2, no JVD, no rubs, no gallops, soft systolic ejection murmur present on examination Respiratory: Good air movement bilaterally, no crackles, no wheezing abdomen: Soft, nontender, no guarding, positive bowel sounds Extremities: No cyanosis or clubbing  Discharge Instructions   Discharge Instructions    Diet - low sodium heart healthy    Complete by:  As directed      Discharge instructions    Complete by:  As directed   Arrange follow-up with primary care physician in 10 days Take medications as prescribed Follow multiple small meals throughout the day to facilitate digestion and preventing postprandial vomiting Keep herself well-hydrated Follow a low-sodium (less than 2-2.5 g of sodium per day) and also a low carbohydrates diet          Current Discharge Medication List    START taking these medications   Details  labetalol (NORMODYNE) 100 MG tablet Take 1 tablet (100 mg total) by mouth 2 (two) times daily. Qty: 60 tablet, Refills: 2    lisinopril (PRINIVIL,ZESTRIL) 5 MG tablet Take 2 tablets (10 mg total) by mouth daily. Qty: 60 tablet, Refills: 2    pantoprazole (PROTONIX) 40 MG tablet Take 1 tablet (40 mg total) by mouth daily. Qty: 30 tablet, Refills: 2      CONTINUE these medications which have CHANGED   Details  metoCLOPramide (REGLAN) 5 MG tablet Take 1 tablet (5 mg total) by mouth 3 (three) times daily. Qty: 90 tablet, Refills: 3      CONTINUE these medications which have NOT CHANGED   Details  aspirin EC 81 MG EC tablet Take 1 tablet (81 mg total) by mouth daily. Qty: 90 tablet, Refills: 3    calcitRIOL (ROCALTROL)  0.5 MCG capsule Take 0.5 mcg by mouth daily.    docusate sodium (COLACE) 100 MG capsule Take 100 mg by mouth 2 (two) times daily.    FLUoxetine (PROZAC) 20 MG tablet Take 20 mg by mouth daily.    Insulin Glargine (LANTUS SOLOSTAR) 100 UNIT/ML Solostar Pen Inject 10 Units into the skin at bedtime.    Lanthanum Carbonate 1000 MG PACK Take 1,000 mg by mouth 3 (three) times daily with meals.     multivitamin (RENA-VIT) TABS tablet Take 1 tablet by mouth daily.    sevelamer (RENVELA) 800 MG tablet Take 800-4,000 mg by mouth 3 (three) times daily with meals. Take [phone] with snacks and 4000 with meals      STOP taking these medications     metoprolol (LOPRESSOR) 50 MG tablet        No Known Allergies Follow-up Information    Follow up with [name], MD. Schedule an appointment as soon as possible for a visit in 10 days.   Specialty:  Family Medicine   Contact information:   [place] [address] [phone]       The results of significant diagnostics from this hospitalization (including imaging, microbiology, ancillary and laboratory) are listed below for reference.    Significant Diagnostic Studies: Ct Head Wo Contrast  01/04/2015   CLINICAL DATA:  Headaches for 1 week.  EXAM: CT HEAD WITHOUT CONTRAST  TECHNIQUE: Contiguous axial images were obtained from the base of the skull through the vertex without intravenous contrast.  COMPARISON:  MRI brain 07/02/2013  FINDINGS: Ventricles and sulci are symmetrical. No mass effect or midline shift. No abnormal extra-axial fluid collections. Gray-white matter junctions are distinct. Basal cisterns are not effaced. No evidence of acute intracranial hemorrhage. No depressed skull fractures. Visualized paranasal sinuses and mastoid air cells are not opacified. Focal cystic lesion in the subcutaneous fat over the C1 spinous process and measuring about 1.8 cm diameter. This probably represents a sebaceous cyst.  IMPRESSION: No  acute intracranial abnormalities.   Electronically Signed   By: Lucienne Capers M.D.   On: 01/04/2015 04:38   Nm Gastric Emptying  01/06/2015   CLINICAL DATA:  Diabetes mellitus.  Postprandial vomiting  EXAM: NUCLEAR MEDICINE GASTRIC EMPTYING SCAN  TECHNIQUE: After oral ingestion of radiolabeled meal, sequential abdominal images were obtained for 4 hours. Percentage of activity emptying the stomach was calculated at 1 hour, 2 hour, and 3 hours post radiotracer administration.  RADIOPHARMACEUTICALS:  2.0 mCi Tc-35m MDP labeled sulfur colloid in egg orally  COMPARISON:  None.  FINDINGS: Expected location of the stomach in the left upper quadrant. Ingested meal empties the stomach gradually over the course of the study.  13.1% emptied at 1 hr ( normal >= 10%)  40.7% emptied at 2 hr ( normal >= 40%)  83.8% emptied at 3 hr ( normal >= 70%)  IMPRESSION: Normal gastric emptying study for solid material.   Electronically Signed   By: Lowella Grip III M.D.   On: 01/06/2015 15:49   Dg Chest Portable 1 View  01/04/2015   CLINICAL DATA:  Chest tightness and shortness of breath tonight.  EXAM: PORTABLE CHEST - 1 VIEW  COMPARISON:  None.  FINDINGS: The heart size and mediastinal contours are within normal limits. Both lungs are clear. The visualized skeletal structures are unremarkable.  IMPRESSION: No active disease.   Electronically Signed   By: Lucienne Capers M.D.   On: 01/04/2015 04:42    Microbiology: Recent Results (from the past 240 hour(s))  MRSA PCR Screening     Status: None   Collection Time: 01/04/15  7:52 AM  Result Value Ref Range Status   MRSA by PCR NEGATIVE NEGATIVE Final    Comment:        The GeneXpert MRSA Assay (FDA approved for NASAL specimens only), is one component of a comprehensive MRSA colonization surveillance program. It is not intended to diagnose MRSA infection nor to guide or monitor treatment for MRSA infections.      Labs: Basic Metabolic Panel:  Recent  Labs Lab 01/04/15 0411 01/05/15 0223 01/06/15 1840 01/07/15 0715  NA 134* 134* 131* 131*  K 4.2 4.8 4.2 4.8  CL 93* 91* 91* 90*  CO2 29 31 27 29   GLUCOSE 167* 111* 161* 103*  BUN 36* 46* 31* 40*  CREATININE 8.10* 10.47* 9.39* 11.08*  CALCIUM 9.5 9.2 9.6 9.6  PHOS  --   --  5.3* 6.0*   Liver Function Tests:  Recent Labs Lab 01/06/15 1840 01/07/15 0715  ALBUMIN 3.6 3.4*   CBC:  Recent Labs Lab 01/04/15 0411 01/06/15 1840 01/07/15 0715  WBC 5.3 6.0 5.7  NEUTROABS 3.1  --   --   HGB 10.2* 10.5* 10.3*  HCT 29.7* 31.7* 30.5*  MCV 93.7 95.8 95.6  PLT 172 173 169   Cardiac Enzymes:  Recent Labs Lab 01/04/15 0411  TROPONINI <0.03   CBG:  Recent Labs Lab 01/06/15 1725 01/06/15 2057 01/07/15 0611 01/07/15 1205 01/07/15 1220  GLUCAP 91 209* 118* 79 80    Signed:  Barton Dubois  [facility] 01/07/2015, 3:41 PM

## 2015-01-07 NOTE — Progress Notes (Signed)
1700 discharge instructions Cynthia [name] prescriptions given to pt Wheeled to lobby by NT wit daughter

## 2015-01-07 NOTE — Procedures (Signed)
Tolerating HD treatment, BP improved.  Volume looks acceptable. Cynthia [name] C

## 2015-01-14 ENCOUNTER — Encounter (HOSPITAL_COMMUNITY): Payer: Self-pay | Admitting: Emergency Medicine

## 2015-01-14 ENCOUNTER — Emergency Department (HOSPITAL_COMMUNITY)
Admission: EM | Admit: 2015-01-14 | Discharge: 2015-01-14 | Disposition: A | Discharge status: Home / Self Care | Payer: Medicare Other | Attending: Emergency Medicine | Admitting: Emergency Medicine

## 2015-01-14 DIAGNOSIS — G629 Polyneuropathy, unspecified: Secondary | ICD-10-CM | POA: Insufficient documentation

## 2015-01-14 DIAGNOSIS — Z9889 Other specified postprocedural states: Secondary | ICD-10-CM | POA: Insufficient documentation

## 2015-01-14 DIAGNOSIS — Z79899 Other long term (current) drug therapy: Secondary | ICD-10-CM | POA: Insufficient documentation

## 2015-01-14 DIAGNOSIS — D649 Anemia, unspecified: Secondary | ICD-10-CM | POA: Insufficient documentation

## 2015-01-14 DIAGNOSIS — R195 Other fecal abnormalities: Secondary | ICD-10-CM | POA: Diagnosis present

## 2015-01-14 DIAGNOSIS — I120 Hypertensive chronic kidney disease with stage 5 chronic kidney disease or end stage renal disease: Secondary | ICD-10-CM | POA: Diagnosis not present

## 2015-01-14 DIAGNOSIS — Z992 Dependence on renal dialysis: Secondary | ICD-10-CM | POA: Insufficient documentation

## 2015-01-14 DIAGNOSIS — K219 Gastro-esophageal reflux disease without esophagitis: Secondary | ICD-10-CM | POA: Diagnosis not present

## 2015-01-14 DIAGNOSIS — Z794 Long term (current) use of insulin: Secondary | ICD-10-CM | POA: Insufficient documentation

## 2015-01-14 DIAGNOSIS — F329 Major depressive disorder, single episode, unspecified: Secondary | ICD-10-CM | POA: Insufficient documentation

## 2015-01-14 DIAGNOSIS — Z7982 Long term (current) use of aspirin: Secondary | ICD-10-CM | POA: Diagnosis not present

## 2015-01-14 DIAGNOSIS — N186 End stage renal disease: Secondary | ICD-10-CM | POA: Insufficient documentation

## 2015-01-14 DIAGNOSIS — K5900 Constipation, unspecified: Secondary | ICD-10-CM | POA: Diagnosis not present

## 2015-01-14 DIAGNOSIS — E119 Type 2 diabetes mellitus without complications: Secondary | ICD-10-CM | POA: Insufficient documentation

## 2015-01-14 LAB — COMPREHENSIVE METABOLIC PANEL
ALBUMIN: 3.7 g/dL (ref 3.5–5.0)
ALT: 26 U/L (ref 14–54)
AST: 17 U/L (ref 15–41)
Alkaline Phosphatase: 77 U/L (ref 38–126)
Anion gap: 11 (ref 5–15)
BUN: 38 mg/dL — AB (ref 6–20)
CO2: 29 mmol/L (ref 22–32)
CREATININE: 8.3 mg/dL — AB (ref 0.44–1.00)
Calcium: 9.9 mg/dL (ref 8.9–10.3)
Chloride: 92 mmol/L — ABNORMAL LOW (ref 101–111)
GFR calc Af Amer: 6 mL/min — ABNORMAL LOW (ref >60)
GFR calc non Af Amer: 5 mL/min — ABNORMAL LOW (ref >60)
GLUCOSE: 133 mg/dL — AB (ref 65–99)
POTASSIUM: 4.9 mmol/L (ref 3.5–5.1)
Sodium: 132 mmol/L — ABNORMAL LOW (ref 135–145)
TOTAL PROTEIN: 7 g/dL (ref 6.5–8.1)
Total Bilirubin: 0.5 mg/dL (ref 0.3–1.2)

## 2015-01-14 LAB — CBC
HEMATOCRIT: 30.1 % — AB (ref 36.0–46.0)
Hemoglobin: 10.3 g/dL — ABNORMAL LOW (ref 12.0–15.0)
MCH: 32.4 pg (ref 26.0–34.0)
MCHC: 34.2 g/dL (ref 30.0–36.0)
MCV: 94.7 fL (ref 78.0–100.0)
Platelets: 202 10*3/uL (ref 150–400)
RBC: 3.18 MIL/uL — ABNORMAL LOW (ref 3.87–5.11)
RDW: 13.5 % (ref 11.5–15.5)
WBC: 7.1 10*3/uL (ref 4.0–10.5)

## 2015-01-14 LAB — LIPASE, BLOOD: Lipase: 17 U/L — ABNORMAL LOW (ref 22–51)

## 2015-01-14 LAB — POC OCCULT BLOOD, ED: FECAL OCCULT BLD: NEGATIVE

## 2015-01-14 MED ORDER — POLYETHYLENE GLYCOL 3350 17 GM/SCOOP PO POWD: 17.0000 g | Freq: Every day | ORAL | Status: DC

## 2015-01-14 MED ORDER — FLEET ENEMA 7-19 GM/118ML RE ENEM
1.0000 | ENEMA | Freq: Once | RECTAL | Status: AC
  Administered 2015-01-14: 1 via RECTAL
  Filled 2015-01-14: qty 1

## 2015-01-14 NOTE — Discharge Instructions (Signed)
Estreimiento (Constipation) Se llama constipacin cuando:  Elimina heces (defeca) menos de 3 veces por semana.  Tiene dificultad para defecar.  Las heces son secas y duras o son ms grandes que lo normal. CUIDADOS EN EL HOGAR   Consuma alimentos con alto contenido de [place]. Por ejemplo, frutas, vegetales, porotos y cereales integrales, como el arroz integral.  Evite los alimentos ricos en grasas y Location manager. Estos incluyen patatas fritas, hamburguesas, galletas, dulces y refrescos.  Si no consume suficientes alimentos ricos en fibras, tome productos que tengan agregado de fibra (suplementos).  Beba suficiente lquido para mantener el pis (orina) claro o de color [place] plido.  Haga ejercicio en forma regular, o como lo indique su mdico.  Vaya al bao cuando sienta la necesidad de Landscape architect. No se aguante las ganas.  Solo tome los medicamentos que le haya indicado su mdico. No tome medicamentos que le ayuden a Landscape architect (laxantes) sin antes consultarlo con su mdico. SOLICITE AYUDA DE INMEDIATO SI:   Observa sangre brillante en las heces (materia fecal).  El estreimiento dura ms de 4 das o [place].  Tiene dolor en el vientre (abdominal) o el trasero (recto).  Las heces son delgadas (como un lpiz).  Pierde peso de [place] inexplicable. ASEGRESE DE QUE:   Comprende estas instrucciones.  Controlar su afeccin.  Recibir ayuda de inmediato si no mejora o si empeora. Document Released: 07/14/2010 Document Revised: 06/16/2013 [facility] Patient Information 2015 [place]. This information is not intended to replace advice given to you by your health care provider. Make sure you discuss any questions you have with your health care provider.

## 2015-01-14 NOTE — ED Notes (Signed)
Patient with one week history of not having a BM.  Patient having rectal and vaginal pain with abdominal pain.  Patient does have nausea, no vomiting at this time.  Patient has tried to disimpact self with no success.  Patient has been taking Miralax with no relief.

## 2015-01-14 NOTE — ED Notes (Signed)
Pt now on the bedside commode.

## 2015-01-14 NOTE — ED Provider Notes (Signed)
CSN: [id number]     Arrival date & time 01/14/15  1852 History   First MD Initiated Contact with Patient 01/14/15 1919     Chief Complaint  Patient presents with  . Constipation   (Consider location/radiation/quality/duration/timing/severity/associated sxs/prior Treatment) HPI Pt isa 45 you female with a hx of HTN, ESRD and DM presenting for constipation over the last week.  Has had intermittent small, hard stools, and attempted using oral laxities and manual disimpaction at home.  Associated with moderate, crampy abdominal pain generalized with no radiation, aggravating, or alleviating factors.  Denies fevers, chills, n/v, hx of SBO, melena, or diarrhea.    Past Medical History  Diagnosis Date  . Hypertension   . ESRD on hemodialysis     Home HD 5x per week  . Depression   . Insulin-dependent diabetes mellitus with renal complications     INSULIN DEPENDENT  . GERD (gastroesophageal reflux disease)   . Neuromuscular disorder     NEUROPATHY  . History of blood transfusion     transfusion reaction  . Anemia    Past Surgical History  Procedure Laterality Date  . Cholecystectomy    . Abdominal hysterectomy    . Breast surgery      biopsy bilateral  . Tubal ligation    . Dialysis fistula creation Left   . Eye surgery Bilateral     lazer  . Revison of arteriovenous fistula Left 09/28/2013    Procedure: EXCISE ESCHAR LEFT ARM  ARTERIOVENOUS FISTULA WITH PLICATION OF LEFT ARM ARTERIOVENOUS FISTULA;  Surgeon: Elam Dutch, MD;  Location: [place];  Service: Vascular;  Laterality: Left;  . Left heart catheterization with coronary angiogram N/A 03/30/2014    Procedure: LEFT HEART CATHETERIZATION WITH CORONARY ANGIOGRAM;  Surgeon: Sinclair Grooms, MD;  Location: [facility] CATH LAB;  Service: Cardiovascular;  Laterality: N/A;   Family History  Problem Relation Age of Onset  . Cancer Maternal Grandmother   . Diabetes Maternal Grandfather   . Diabetes Paternal Grandmother   . Diabetes Mother    . Kidney disease Mother   . Diabetes Father   . Heart disease Father   . Kidney disease Father   . Diabetes Sister   . Asthma Sister   . Lupus Sister   . Diabetes Brother    History  Substance Use Topics  . Smoking status: Never Smoker   . Smokeless tobacco: Never Used  . Alcohol Use: No   OB History    No data available     Review of Systems  Constitutional: Negative for fever and chills.  HENT: Negative for congestion and sore throat.   Eyes: Negative for pain.  Respiratory: Negative for cough and shortness of breath.   Cardiovascular: Negative for chest pain and palpitations.  Gastrointestinal: Positive for abdominal pain and constipation. Negative for nausea, vomiting, diarrhea, blood in stool, abdominal distention, anal bleeding and rectal pain.  Genitourinary: Negative for dysuria and flank pain.  Musculoskeletal: Negative for back pain and neck pain.  Skin: Negative for rash.  Allergic/Immunologic: Negative.   Neurological: Negative for dizziness and light-headedness.  Psychiatric/Behavioral: Negative for confusion.      Allergies  Review of patient's allergies indicates no known allergies.  Home Medications   Prior to Admission medications   Medication Sig Start Date End Date Taking? Authorizing Provider  aspirin EC 81 MG EC tablet Take 1 tablet (81 mg total) by mouth daily. 03/31/14  Yes Katheren Shams, DO  calcitRIOL (ROCALTROL) 0.5 MCG  capsule Take 0.5 mcg by mouth daily.   Yes Historical Provider, MD  docusate sodium (COLACE) 100 MG capsule Take 100 mg by mouth 2 (two) times daily.   Yes Historical Provider, MD  FLUoxetine (PROZAC) 20 MG tablet Take 20 mg by mouth daily.   Yes Historical Provider, MD  gabapentin (NEURONTIN) 300 MG capsule Take 300 mg by mouth at bedtime. 11/16/14  Yes Historical Provider, MD  Insulin Glargine (LANTUS SOLOSTAR) 100 UNIT/ML Solostar Pen Inject 10 Units into the skin at bedtime.   Yes Historical Provider, MD  labetalol  (NORMODYNE) 100 MG tablet Take 1 tablet (100 mg total) by mouth 2 (two) times daily. 01/07/15  Yes Barton Dubois, MD  Lanthanum Carbonate 1000 MG PACK Take 1,000 mg by mouth 3 (three) times daily with meals.    Yes Historical Provider, MD  lisinopril (PRINIVIL,ZESTRIL) 5 MG tablet Take 2 tablets (10 mg total) by mouth daily. 01/07/15  Yes Barton Dubois, MD  metoCLOPramide (REGLAN) 5 MG tablet Take 1 tablet (5 mg total) by mouth 3 (three) times daily. 01/07/15  Yes Barton Dubois, MD  multivitamin (RENA-VIT) TABS tablet Take 1 tablet by mouth daily.   Yes Historical Provider, MD  pantoprazole (PROTONIX) 40 MG tablet Take 1 tablet (40 mg total) by mouth daily. 01/07/15  Yes Barton Dubois, MD  sevelamer (RENVELA) 800 MG tablet Take 800-4,000 mg by mouth 3 (three) times daily with meals. Take [id number] with snacks and 4000 with meals   Yes Historical Provider, MD  polyethylene glycol powder (GLYCOLAX/MIRALAX) powder Take 17 g by mouth daily. 01/14/15   Geronimo Boot, MD   BP 172/68 mmHg  Pulse 78  Temp(Src) 98 F (36.7 C) (Oral)  Resp 16  Wt 173 lb 12.8 oz (78.835 kg)  SpO2 98% Physical Exam  Constitutional: She is oriented to person, place, and time. She appears well-developed and well-nourished. No distress.  HENT:  Head: Normocephalic and atraumatic.  Eyes: Conjunctivae and EOM are normal. Pupils are equal, round, and reactive to light.  Neck: Normal range of motion. Neck supple.  Cardiovascular: Normal rate, regular rhythm and normal heart sounds.   Pulmonary/Chest: Effort normal and breath sounds normal. No respiratory distress.  Abdominal: Soft. Bowel sounds are normal. She exhibits no pulsatile midline mass and no mass. There is generalized tenderness. There is no rigidity, no rebound, no guarding, no CVA tenderness, no tenderness at McBurney's point and negative Murphy's sign.  Genitourinary: Rectal exam shows no external hemorrhoid, no internal hemorrhoid, no fissure, no mass, no tenderness  and anal tone normal. Guaiac negative stool.  Hard stool palpated in rectum and disimpacted.   Musculoskeletal: Normal range of motion.  Neurological: She is alert and oriented to person, place, and time. She has normal reflexes. No cranial nerve deficit.  Skin: Skin is warm and dry. She is not diaphoretic.  Psychiatric: She has a normal mood and affect.    ED Course  Procedures (including critical care time) Labs Review Labs Reviewed  LIPASE, BLOOD - Abnormal; Notable for the following:    Lipase 17 (*)    All other components within normal limits  COMPREHENSIVE METABOLIC PANEL - Abnormal; Notable for the following:    Sodium 132 (*)    Chloride 92 (*)    Glucose, Bld 133 (*)    BUN 38 (*)    Creatinine, Ser 8.30 (*)    GFR calc non Af Amer 5 (*)    GFR calc Af Amer 6 (*)    All  other components within normal limits  CBC - Abnormal; Notable for the following:    RBC 3.18 (*)    Hemoglobin 10.3 (*)    HCT 30.1 (*)    All other components within normal limits  POC OCCULT BLOOD, ED    Imaging Review No results found.   EKG Interpretation None      MDM   Final diagnoses:  Constipation, unspecified constipation type   On exam pt HDS in NAD.  Abdominal exam benign and labs with no concerning findings.  I manually disimpacted moderate amount of stool at bedside.  No abnormalities of anus or palpated in rectum.  Given enema in the ED and had large BM with alleviation of abdominal pain.  Prescribed miralax and advised to f/u with PCP in 3-5 days.   If performed, labs, EKGs, and imaging were reviewed/interpreted by myself and my attending and incorporated into medical decision making.  Discussed pertinent finding with patient or caregiver prior to discharge with no further questions.  Immediate return precautions given and pt or caregiver reports understanding.  Pt care supervised by my attending Dr. Johnney Killian.   Geronimo Boot, MD PGY-2  Emergency Medicine     Geronimo Boot, MD 01/16/15 [place], MD 01/23/15 [phone]

## 2015-01-14 NOTE — ED Notes (Signed)
Pt does not make urine, dialysis pt.

## 2015-01-14 NOTE — ED Notes (Signed)
This RN and Research scientist (medical) in room to administer Fleet enema.

## 2015-01-14 NOTE — ED Notes (Signed)
Pt and pt's daughter verbalized understanding of miralax use and d/c instructions and have no further questions.

## 2015-01-26 ENCOUNTER — Ambulatory Visit
Admission: RE | Admit: 2015-01-26 | Discharge: 2015-01-26 | Disposition: A | Discharge status: Home / Self Care | Payer: Medicare Other | Source: Ambulatory Visit | Attending: Diagnostic Neuroimaging | Admitting: Diagnostic Neuroimaging

## 2015-01-26 DIAGNOSIS — R251 Tremor, unspecified: Secondary | ICD-10-CM | POA: Diagnosis not present

## 2015-02-08 ENCOUNTER — Ambulatory Visit: Payer: Medicare Other | Admitting: Diagnostic Neuroimaging

## 2015-02-09 ENCOUNTER — Encounter: Payer: Self-pay | Admitting: Diagnostic Neuroimaging

## 2015-03-06 ENCOUNTER — Encounter (HOSPITAL_COMMUNITY): Payer: Self-pay | Admitting: *Deleted

## 2015-03-06 ENCOUNTER — Emergency Department (HOSPITAL_COMMUNITY)
Admission: EM | Admit: 2015-03-06 | Discharge: 2015-03-06 | Disposition: A | Discharge status: Home / Self Care | Payer: Medicare Other | Attending: Emergency Medicine | Admitting: Emergency Medicine

## 2015-03-06 DIAGNOSIS — E1122 Type 2 diabetes mellitus with diabetic chronic kidney disease: Secondary | ICD-10-CM | POA: Diagnosis not present

## 2015-03-06 DIAGNOSIS — N186 End stage renal disease: Secondary | ICD-10-CM | POA: Insufficient documentation

## 2015-03-06 DIAGNOSIS — I120 Hypertensive chronic kidney disease with stage 5 chronic kidney disease or end stage renal disease: Secondary | ICD-10-CM | POA: Insufficient documentation

## 2015-03-06 DIAGNOSIS — F329 Major depressive disorder, single episode, unspecified: Secondary | ICD-10-CM | POA: Insufficient documentation

## 2015-03-06 DIAGNOSIS — G629 Polyneuropathy, unspecified: Secondary | ICD-10-CM | POA: Insufficient documentation

## 2015-03-06 DIAGNOSIS — Z794 Long term (current) use of insulin: Secondary | ICD-10-CM | POA: Diagnosis not present

## 2015-03-06 DIAGNOSIS — Z992 Dependence on renal dialysis: Secondary | ICD-10-CM | POA: Diagnosis not present

## 2015-03-06 DIAGNOSIS — Z79899 Other long term (current) drug therapy: Secondary | ICD-10-CM | POA: Diagnosis not present

## 2015-03-06 DIAGNOSIS — Z7982 Long term (current) use of aspirin: Secondary | ICD-10-CM | POA: Diagnosis not present

## 2015-03-06 DIAGNOSIS — E876 Hypokalemia: Secondary | ICD-10-CM

## 2015-03-06 DIAGNOSIS — I160 Hypertensive urgency: Secondary | ICD-10-CM

## 2015-03-06 DIAGNOSIS — K219 Gastro-esophageal reflux disease without esophagitis: Secondary | ICD-10-CM | POA: Diagnosis not present

## 2015-03-06 DIAGNOSIS — Z862 Personal history of diseases of the blood and blood-forming organs and certain disorders involving the immune mechanism: Secondary | ICD-10-CM | POA: Diagnosis not present

## 2015-03-06 LAB — CBC WITH DIFFERENTIAL/PLATELET
BASOS PCT: 0 % (ref 0–1)
Basophils Absolute: 0 10*3/uL (ref 0.0–0.1)
EOS ABS: 0.3 10*3/uL (ref 0.0–0.7)
Eosinophils Relative: 4 % (ref 0–5)
HCT: 24 % — ABNORMAL LOW (ref 36.0–46.0)
HEMOGLOBIN: 8 g/dL — AB (ref 12.0–15.0)
LYMPHS ABS: 1.4 10*3/uL (ref 0.7–4.0)
Lymphocytes Relative: 19 % (ref 12–46)
MCH: 31.1 pg (ref 26.0–34.0)
MCHC: 33.3 g/dL (ref 30.0–36.0)
MCV: 93.4 fL (ref 78.0–100.0)
MONOS PCT: 6 % (ref 3–12)
Monocytes Absolute: 0.4 10*3/uL (ref 0.1–1.0)
NEUTROS ABS: 5.3 10*3/uL (ref 1.7–7.7)
NEUTROS PCT: 71 % (ref 43–77)
Platelets: 197 10*3/uL (ref 150–400)
RBC: 2.57 MIL/uL — AB (ref 3.87–5.11)
RDW: 14.4 % (ref 11.5–15.5)
WBC: 7.5 10*3/uL (ref 4.0–10.5)

## 2015-03-06 LAB — BASIC METABOLIC PANEL
ANION GAP: 6 (ref 5–15)
BUN: 10 mg/dL (ref 6–20)
CALCIUM: 10.2 mg/dL (ref 8.9–10.3)
CHLORIDE: 97 mmol/L — AB (ref 101–111)
CO2: 30 mmol/L (ref 22–32)
Creatinine, Ser: 4.17 mg/dL — ABNORMAL HIGH (ref 0.44–1.00)
GFR calc Af Amer: 14 mL/min — ABNORMAL LOW (ref >60)
GFR calc non Af Amer: 12 mL/min — ABNORMAL LOW (ref >60)
Glucose, Bld: 135 mg/dL — ABNORMAL HIGH (ref 65–99)
Potassium: 2.6 mmol/L — CL (ref 3.5–5.1)
SODIUM: 133 mmol/L — AB (ref 135–145)

## 2015-03-06 MED ORDER — POTASSIUM CHLORIDE CRYS ER 20 MEQ PO TBCR
40.0000 meq | EXTENDED_RELEASE_TABLET | Freq: Once | ORAL | Status: AC
  Administered 2015-03-06: 40 meq via ORAL
  Filled 2015-03-06: qty 2

## 2015-03-06 MED ORDER — CLONIDINE HCL 0.2 MG PO TABS
0.2000 mg | ORAL_TABLET | Freq: Once | ORAL | Status: AC
  Administered 2015-03-06: 0.2 mg via ORAL
  Filled 2015-03-06: qty 1

## 2015-03-06 NOTE — ED Notes (Addendum)
Notified Dr. Stark Jock of Potassium 2.6. Provided EKG. MD aware.

## 2015-03-06 NOTE — Discharge Instructions (Signed)
Hipertensin (Hypertension) La hipertensin, conocida comnmente como presin arterial alta, se produce cuando la sangre bombea en las arterias con mucha fuerza. Las arterias son los vasos sanguneos que transportan la sangre desde el corazn hacia todas las partes del cuerpo. Una lectura de la presin arterial consiste en un nmero ms alto sobre un nmero ms bajo, por ejemplo, 110/72. El nmero ms alto (presin sistlica) corresponde a la presin interna de las arterias cuando el corazn [place]. El nmero ms bajo (presin diastlica) corresponde a la presin interna de las arterias cuando el corazn se relaja. En condiciones ideales, la presin arterial debe ser inferior a 120/80. La hipertensin fuerza al corazn a trabajar ms para Herbalist. Las arterias pueden estrecharse o ponerse rgidas. La hipertensin conlleva el riesgo de enfermedad cardaca, ictus y otros problemas.  Solis de riesgo de hipertensin son controlables, pero otros no lo son.  [employer] factores de riesgo que usted no puede Chief Technology Officer, se incluyen:   Manufacturing systems engineer. El riesgo es mayor para las Retail banker.  La edad. Los riesgos aumentan con la edad.  El sexo. Antes de los 45aos, los hombres corren ms [employer]. Despus de los 65aos, las mujeres corren ms [employer]. Entre los factores de riesgo que usted puede Chief Technology Officer, se incluyen:  No hacer la cantidad suficiente de actividad fsica o ejercicio.  Tener sobrepeso.  Consumir mucha grasa, azcar, caloras o sal en la dieta.  Beber alcohol en exceso. SIGNOS Y SNTOMAS Por lo general, la hipertensin no causa signos o sntomas. La hipertensin demasiado alta (crisis hipertensiva) puede causar dolor de cabeza, ansiedad, falta de aire y hemorragia nasal. DIAGNSTICO  Para detectar si usted tiene hipertensin, el mdico le medir la presin arterial mientras est sentado, con el brazo  levantado a la altura del corazn. Debe medirla al [facility] veces en el mismo brazo. Determinadas condiciones pueden causar una diferencia de presin arterial entre el brazo izquierdo y Insurance underwriter. El hecho de tener una sola lectura de la presin arterial ms alta que lo normal no significa que Stage manager. En el caso de tener una lectura de la presin arterial con un valor alto, pdale al mdico que la verifique nuevamente. [employer] hipertensin arterial incluye hacer cambios en el estilo de vida y, posiblemente, tomar medicamentos. Un estilo de vida saludable puede ayudar a bajar la presin arterial alta. Quiz deba cambiar algunos hbitos. Los [employer] en el estilo de vida pueden incluir:  Seguir la dieta DASH. Esta dieta tiene un alto contenido de frutas, verduras y Psychologist, prison and probation services. Incluye poca cantidad de sal, carnes rojas y azcares agregados.  Hacer al menos 2horas de actividad fsica enrgica todas las semanas.  Perder peso, si es necesario.  No fumar.  Limitar el consumo de bebidas alcohlicas.  Aprender formas de reducir el estrs. Si los cambios en el estilo de vida no son suficientes para Child psychotherapist la presin arterial, el mdico puede recetarle medicamentos. Quiz necesite tomar ms de uno. Trabaje en conjunto con su mdico para comprender los riesgos y los beneficios. INSTRUCCIONES PARA EL CUIDADO EN EL HOGAR  Haga que le midan de nuevo la presin arterial segn las indicaciones del [place] los medicamentos solamente como se lo haya indicado el mdico. Siga cuidadosamente las indicaciones. Los medicamentos para la presin arterial deben tomarse segn las indicaciones. Los medicamentos pierden eficacia al omitir las dosis. El hecho de omitir  las dosis tambin aumenta el riesgo de otros [place].  No fume.  Contrlese la presin arterial en su casa segn las indicaciones del mdico. SOLICITE ATENCIN MDICA SI:   Piensa  que tiene una reaccin alrgica a los medicamentos.  Tiene mareos o dolores de cabeza con Scientist, research (physical sciences).  Tiene hinchazn en los tobillos.  Tiene problemas de visin. SOLICITE ATENCIN MDICA DE INMEDIATO SI:  Siente un dolor de cabeza intenso o confusin.  Siente debilidad inusual, adormecimiento o que Geneticist, molecular.  Siente dolor intenso en el pecho o en el abdomen.  Vomita repetidas veces.  Tiene dificultad para respirar. ASEGRESE DE QUE:   Comprende estas instrucciones.  Controlar su afeccin.  Recibir ayuda de inmediato si no mejora o si empeora. Document Released: 06/11/2005 Document Revised: 10/26/2013 [facility] Patient Information 2015 [place], [place]. This information is not intended to replace advice given to you by your health care provider. Make sure you discuss any questions you have with your health care provider.

## 2015-03-06 NOTE — ED Notes (Signed)
Discharge instructions reviewed with patient/daughter. Understanding verbalized. Patient declined wheelchair at time of discharge. No acute distress noted.

## 2015-03-06 NOTE — ED Provider Notes (Signed)
CSN: [id number]     Arrival date & time 03/06/15  0227 History   First MD Initiated Contact with Patient 03/06/15 0510     Chief Complaint  Patient presents with  . Hypertension     (Consider location/radiation/quality/duration/timing/severity/associated sxs/prior Treatment) HPI Comments: Patient is a 44 year old female with history of insulin-dependent diabetes, hypertension, and end-stage renal disease on hemodialysis. She presents with elevated blood pressure. She states she had her normal dialysis session this evening. When she went home she was having difficulty sleeping, then took her blood pressure. Her blood pressure was elevated over 200 and presents for evaluation of this. She reports a slight headache, but no visual disturbances. She denies any injury or trauma.  Patient is a 44 y.o. female presenting with hypertension. The history is provided by the patient.  Hypertension This is a new problem. The current episode started 1 to 2 hours ago. The problem occurs constantly. The problem has not changed since onset.Associated symptoms include headaches. Pertinent negatives include no chest pain, no abdominal pain and no shortness of breath. Nothing aggravates the symptoms. Nothing relieves the symptoms. She has tried nothing for the symptoms. The treatment provided no relief.    Past Medical History  Diagnosis Date  . Hypertension   . ESRD on hemodialysis     Home HD 5x per week  . Depression   . Insulin-dependent diabetes mellitus with renal complications     INSULIN DEPENDENT  . GERD (gastroesophageal reflux disease)   . Neuromuscular disorder     NEUROPATHY  . History of blood transfusion     transfusion reaction  . Anemia    Past Surgical History  Procedure Laterality Date  . Cholecystectomy    . Abdominal hysterectomy    . Breast surgery      biopsy bilateral  . Tubal ligation    . Dialysis fistula creation Left   . Eye surgery Bilateral     lazer  . Revison of  arteriovenous fistula Left 09/28/2013    Procedure: EXCISE ESCHAR LEFT ARM  ARTERIOVENOUS FISTULA WITH PLICATION OF LEFT ARM ARTERIOVENOUS FISTULA;  Surgeon: Elam Dutch, MD;  Location: [place];  Service: Vascular;  Laterality: Left;  . Left heart catheterization with coronary angiogram N/A 03/30/2014    Procedure: LEFT HEART CATHETERIZATION WITH CORONARY ANGIOGRAM;  Surgeon: Sinclair Grooms, MD;  Location: [facility] CATH LAB;  Service: Cardiovascular;  Laterality: N/A;   Family History  Problem Relation Age of Onset  . Cancer Maternal Grandmother   . Diabetes Maternal Grandfather   . Diabetes Paternal Grandmother   . Diabetes Mother   . Kidney disease Mother   . Diabetes Father   . Heart disease Father   . Kidney disease Father   . Diabetes Sister   . Asthma Sister   . Lupus Sister   . Diabetes Brother    Social History  Substance Use Topics  . Smoking status: Never Smoker   . Smokeless tobacco: Never Used  . Alcohol Use: No   OB History    No data available     Review of Systems  Respiratory: Negative for shortness of breath.   Cardiovascular: Negative for chest pain.  Gastrointestinal: Negative for abdominal pain.  Neurological: Positive for headaches.  All other systems reviewed and are negative.     Allergies  Review of patient's allergies indicates no known allergies.  Home Medications   Prior to Admission medications   Medication Sig Start Date End Date Taking? Authorizing  Provider  aspirin EC 81 MG EC tablet Take 1 tablet (81 mg total) by mouth daily. 03/31/14   Katheren Shams, DO  calcitRIOL (ROCALTROL) 0.5 MCG capsule Take 0.5 mcg by mouth daily.    Historical Provider, MD  docusate sodium (COLACE) 100 MG capsule Take 100 mg by mouth 2 (two) times daily.    Historical Provider, MD  FLUoxetine (PROZAC) 20 MG tablet Take 20 mg by mouth daily.    Historical Provider, MD  gabapentin (NEURONTIN) 300 MG capsule Take 300 mg by mouth at bedtime. 11/16/14   Historical  Provider, MD  Insulin Glargine (LANTUS SOLOSTAR) 100 UNIT/ML Solostar Pen Inject 10 Units into the skin at bedtime.    Historical Provider, MD  labetalol (NORMODYNE) 100 MG tablet Take 1 tablet (100 mg total) by mouth 2 (two) times daily. 01/07/15   Barton Dubois, MD  Lanthanum Carbonate 1000 MG PACK Take 1,000 mg by mouth 3 (three) times daily with meals.     Historical Provider, MD  lisinopril (PRINIVIL,ZESTRIL) 5 MG tablet Take 2 tablets (10 mg total) by mouth daily. 01/07/15   Barton Dubois, MD  metoCLOPramide (REGLAN) 5 MG tablet Take 1 tablet (5 mg total) by mouth 3 (three) times daily. 01/07/15   Barton Dubois, MD  multivitamin (RENA-VIT) TABS tablet Take 1 tablet by mouth daily.    Historical Provider, MD  pantoprazole (PROTONIX) 40 MG tablet Take 1 tablet (40 mg total) by mouth daily. 01/07/15   Barton Dubois, MD  polyethylene glycol powder (GLYCOLAX/MIRALAX) powder Take 17 g by mouth daily. 01/14/15   Geronimo Boot, MD  sevelamer (RENVELA) 800 MG tablet Take 800-4,000 mg by mouth 3 (three) times daily with meals. Take [phone] with snacks and 4000 with meals    Historical Provider, MD   BP 190/63 mmHg  Pulse 72  Temp(Src) 98.2 F (36.8 C) (Oral)  Resp 20  Ht 5\' 1"  (1.549 m)  Wt 165 lb (74.844 kg)  BMI 31.19 kg/m2  SpO2 97% Physical Exam  Constitutional: She is oriented to person, place, and time. She appears well-developed and well-nourished. No distress.  HENT:  Head: Normocephalic and atraumatic.  Eyes: EOM are normal. Pupils are equal, round, and reactive to light.  Neck: Normal range of motion. Neck supple.  Cardiovascular: Normal rate and regular rhythm.  Exam reveals no gallop and no friction rub.   No murmur heard. Pulmonary/Chest: Effort normal and breath sounds normal. No respiratory distress. She has no wheezes.  Abdominal: Soft. Bowel sounds are normal. She exhibits no distension. There is no tenderness.  Musculoskeletal: Normal range of motion. She exhibits no edema.   Neurological: She is alert and oriented to person, place, and time. No cranial nerve deficit. She exhibits normal muscle tone. Coordination normal.  Skin: Skin is warm and dry. She is not diaphoretic.  Nursing note and vitals reviewed.   ED Course  Procedures (including critical care time) Labs Review Labs Reviewed  BASIC METABOLIC PANEL  CBC WITH DIFFERENTIAL/PLATELET    Imaging Review No results found. I have personally reviewed and evaluated these images and lab results as part of my medical decision-making.   EKG Interpretation   Date/Time:  Sunday March 06 2015 06:27:40 EDT Ventricular Rate:  70 PR Interval:  170 QRS Duration: 101 QT Interval:  466 QTC Calculation: 503 R Axis:   -53 Text Interpretation:  Sinus rhythm Left anterior fascicular block ST elev,  probable normal early repol pattern Borderline prolonged QT interval  Confirmed by [facility]  MD, Nathaneil Canary ([id number]) on 03/06/2015 7:01:11 AM      MDM   Final diagnoses:  None    Patient presents with complaints of elevated blood pressure and not feeling well. She appears clinically well. Her EKG is unchanged and laboratory studies are unremarkable with the exception of a potassium of 2.6. She will be given an oral dose of potassium for this. She is due for dialysis in 2 days and I will not prescribe further potassium to take at home due to her renal failure. Her blood pressures have been improved after clonidine in the ER and I believe is appropriate to be discharged.    Veryl Speak, MD 03/06/15 [phone]

## 2015-03-06 NOTE — ED Notes (Signed)
The pt s bp has been elevated bp tonight.  She has a headache whenever her bp  Is high.  She has taken 2 xtra lisionopril 1 1/2 hours ago.

## 2015-03-06 NOTE — ED Notes (Signed)
Patient/family report that patient checked her blood pressure at home at approximately 0030 prior to coming to the ED. BP 213/79. Patient takes Lisinopril 5 mg tablets x 2 daily. Reports headache. 3/10 on 0-10 pain scale. Also, reports intermittent nausea.

## 2015-03-28 ENCOUNTER — Emergency Department (HOSPITAL_COMMUNITY)
Admission: EM | Admit: 2015-03-28 | Discharge: 2015-03-28 | Disposition: A | Discharge status: Home / Self Care | Payer: Medicare Other | Attending: Emergency Medicine | Admitting: Emergency Medicine

## 2015-03-28 ENCOUNTER — Encounter (HOSPITAL_COMMUNITY): Payer: Self-pay

## 2015-03-28 DIAGNOSIS — Z79899 Other long term (current) drug therapy: Secondary | ICD-10-CM | POA: Diagnosis not present

## 2015-03-28 DIAGNOSIS — Z7982 Long term (current) use of aspirin: Secondary | ICD-10-CM | POA: Insufficient documentation

## 2015-03-28 DIAGNOSIS — K219 Gastro-esophageal reflux disease without esophagitis: Secondary | ICD-10-CM | POA: Insufficient documentation

## 2015-03-28 DIAGNOSIS — G709 Myoneural disorder, unspecified: Secondary | ICD-10-CM | POA: Diagnosis not present

## 2015-03-28 DIAGNOSIS — F329 Major depressive disorder, single episode, unspecified: Secondary | ICD-10-CM | POA: Diagnosis not present

## 2015-03-28 DIAGNOSIS — R51 Headache: Secondary | ICD-10-CM | POA: Insufficient documentation

## 2015-03-28 DIAGNOSIS — E119 Type 2 diabetes mellitus without complications: Secondary | ICD-10-CM | POA: Diagnosis not present

## 2015-03-28 DIAGNOSIS — Z794 Long term (current) use of insulin: Secondary | ICD-10-CM | POA: Insufficient documentation

## 2015-03-28 DIAGNOSIS — I120 Hypertensive chronic kidney disease with stage 5 chronic kidney disease or end stage renal disease: Secondary | ICD-10-CM | POA: Insufficient documentation

## 2015-03-28 DIAGNOSIS — Z992 Dependence on renal dialysis: Secondary | ICD-10-CM | POA: Insufficient documentation

## 2015-03-28 DIAGNOSIS — N186 End stage renal disease: Secondary | ICD-10-CM | POA: Diagnosis not present

## 2015-03-28 DIAGNOSIS — Z87448 Personal history of other diseases of urinary system: Secondary | ICD-10-CM

## 2015-03-28 DIAGNOSIS — Z862 Personal history of diseases of the blood and blood-forming organs and certain disorders involving the immune mechanism: Secondary | ICD-10-CM | POA: Diagnosis not present

## 2015-03-28 DIAGNOSIS — H538 Other visual disturbances: Secondary | ICD-10-CM | POA: Insufficient documentation

## 2015-03-28 DIAGNOSIS — I10 Essential (primary) hypertension: Secondary | ICD-10-CM

## 2015-03-28 LAB — CBC
HCT: 27.6 % — ABNORMAL LOW (ref 36.0–46.0)
HEMOGLOBIN: 9.5 g/dL — AB (ref 12.0–15.0)
MCH: 31.5 pg (ref 26.0–34.0)
MCHC: 34.4 g/dL (ref 30.0–36.0)
MCV: 91.4 fL (ref 78.0–100.0)
Platelets: 193 10*3/uL (ref 150–400)
RBC: 3.02 MIL/uL — AB (ref 3.87–5.11)
RDW: 14.6 % (ref 11.5–15.5)
WBC: 7.7 10*3/uL (ref 4.0–10.5)

## 2015-03-28 LAB — BASIC METABOLIC PANEL
Anion gap: 10 (ref 5–15)
BUN: 13 mg/dL (ref 6–20)
CHLORIDE: 93 mmol/L — AB (ref 101–111)
CO2: 29 mmol/L (ref 22–32)
Calcium: 9.7 mg/dL (ref 8.9–10.3)
Creatinine, Ser: 4.69 mg/dL — ABNORMAL HIGH (ref 0.44–1.00)
GFR calc non Af Amer: 10 mL/min — ABNORMAL LOW (ref >60)
GFR, EST AFRICAN AMERICAN: 12 mL/min — AB (ref >60)
Glucose, Bld: 97 mg/dL (ref 65–99)
POTASSIUM: 2.8 mmol/L — AB (ref 3.5–5.1)
Sodium: 132 mmol/L — ABNORMAL LOW (ref 135–145)

## 2015-03-28 MED ORDER — CLONIDINE HCL 0.2 MG PO TABS
0.2000 mg | ORAL_TABLET | Freq: Once | ORAL | Status: AC
  Administered 2015-03-28: 0.2 mg via ORAL
  Filled 2015-03-28: qty 1

## 2015-03-28 MED ORDER — ACETAMINOPHEN 500 MG PO TABS
1000.0000 mg | ORAL_TABLET | Freq: Once | ORAL | Status: AC
  Administered 2015-03-28: 1000 mg via ORAL
  Filled 2015-03-28: qty 2

## 2015-03-28 MED ORDER — CLONIDINE HCL 0.2 MG PO TABS: 0.2000 mg | ORAL_TABLET | Freq: Two times a day (BID) | ORAL | Status: DC

## 2015-03-28 NOTE — ED Notes (Addendum)
Pt here with daughter. Pt speaks spanish and can understand english. Pt has been having problems with her blood pressure all week and has been having headaches all week, blurry vision, nausea also. Is on home dialysis 4x/ week whatever days she wants to. Takes her blood pills as prescribed. Pt looks jaundiced but daughter states she normally looks that color.

## 2015-03-28 NOTE — ED Provider Notes (Signed)
CSN: [id number]:[id number]     Arrival date & time 03/28/15  0211 History  By signing my name below, I, Sonum Patel, attest that this documentation has been prepared under the direction and in the presence of Veryl Speak, MD. Electronically Signed: Sonum Patel, Education administrator. 03/28/2015. 3:17 AM.    Chief Complaint  Patient presents with  . Hypertension  . Headache  . Blurred Vision   The history is provided by the patient and a relative. No language interpreter was used.     HPI Comments: Cynthia [name] is a 44 y.o. female with past medical history of HTN, ESRD, insulin-dependent diabetes who presents to the Emergency Department complaining of elevated blood pressure for the past week with an associated HA. Daughter states patient's last hemodialysis treatment was 03/27/15. She states her systolic pressures have been in the 200's. She currently takes labetalol 100 mg BID and lisinopril 10 mg QD. She was last seen in the ED on 03/06/15 for similar symptoms; she was given clonidine during her last visit which helped improve her blood pressure. She denies CP, SOB.   Past Medical History  Diagnosis Date  . Hypertension   . ESRD on hemodialysis ([place])     Home HD 5x per week  . Depression   . 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 GERD (gastroesophageal reflux disease)   . Neuromuscular disorder ([place])     NEUROPATHY  . History of blood transfusion     transfusion reaction  . Anemia    Past Surgical History  Procedure Laterality Date  . Cholecystectomy    . Abdominal hysterectomy    . Breast surgery      biopsy bilateral  . Tubal ligation    . Dialysis fistula creation Left   . Eye surgery Bilateral     lazer  . Revison of arteriovenous fistula Left 09/28/2013    Procedure: EXCISE ESCHAR LEFT ARM  ARTERIOVENOUS FISTULA WITH PLICATION OF LEFT ARM ARTERIOVENOUS FISTULA;  Surgeon: Elam Dutch, MD;  Location: [facility];  Service: Vascular;  Laterality:  Left;  . Left heart catheterization with coronary angiogram N/A 03/30/2014    Procedure: LEFT HEART CATHETERIZATION WITH CORONARY ANGIOGRAM;  Surgeon: Sinclair Grooms, MD;  Location: [facility] C[facility] LAB;  Service: Cardiovascular;  Laterality: N/A;   Family History  Problem Relation Age of Onset  . Cancer Maternal Grandmother   . Diabetes Maternal Grandfather   . Diabetes Paternal Grandmother   . Diabetes Mother   . Kidney disease Mother   . Diabetes Father   . Heart disease Father   . Kidney disease Father   . Diabetes Sister   . Asthma Sister   . Lupus Sister   . Diabetes Brother    Social History  Substance Use Topics  . Smoking status: Never Smoker   . Smokeless tobacco: Never Used  . Alcohol Use: No   OB History    No data available     Review of Systems  Constitutional: Negative for fever.       +elevated blood pressure  Respiratory: Negative for shortness of breath.   Cardiovascular: Negative for chest pain.  Neurological: Positive for headaches.  All other systems reviewed and are negative.     Allergies  Review of patient's allergies indicates no known allergies.  Home Medications   Prior to Admission medications   Medication Sig Start Date End Date Taking? Authorizing Provider  aspirin EC 81 MG EC  tablet Take 1 tablet (81 mg total) by mouth daily. 03/31/14   Katheren Shams, DO  calcitRIOL (ROCALTROL) 0.5 MCG capsule Take 0.5 mcg by mouth daily.    Historical Provider, MD  docusate sodium (COLACE) 100 MG capsule Take 100 mg by mouth 2 (two) times daily.    Historical Provider, MD  FLUoxetine (PROZAC) 20 MG tablet Take 20 mg by mouth daily.    Historical Provider, MD  gabapentin (NEURONTIN) 300 MG capsule Take 300 mg by mouth at bedtime. 11/16/14   Historical Provider, MD  Insulin Glargine (LANTUS SOLOSTAR) 100 UNIT/ML Solostar Pen Inject 10 Units into the skin at bedtime.    Historical Provider, MD  labetalol (NORMODYNE) 100 MG tablet Take 1 tablet (100 mg total)  by mouth 2 (two) times daily. 01/07/15   Barton Dubois, MD  Lanthanum Carbonate 1000 MG PACK Take 1,000 mg by mouth 3 (three) times daily with meals.     Historical Provider, MD  lisinopril (PRINIVIL,ZESTRIL) 5 MG tablet Take 2 tablets (10 mg total) by mouth daily. 01/07/15   Barton Dubois, MD  metoCLOPramide (REGLAN) 5 MG tablet Take 1 tablet (5 mg total) by mouth 3 (three) times daily. 01/07/15   Barton Dubois, MD  multivitamin (RENA-VIT) TABS tablet Take 1 tablet by mouth daily.    Historical Provider, MD  pantoprazole (PROTONIX) 40 MG tablet Take 1 tablet (40 mg total) by mouth daily. 01/07/15   Barton Dubois, MD  polyethylene glycol powder (GLYCOLAX/MIRALAX) powder Take 17 g by mouth daily. 01/14/15   Geronimo Boot, MD  sevelamer (RENVELA) 800 MG tablet Take 800-4,000 mg by mouth 3 (three) times daily with meals. Take [phone] with snacks and 4000 with meals    Historical Provider, MD   BP 198/70 mmHg  Pulse 74  Temp(Src) 98 F (36.7 C) (Oral)  Resp 18  Ht 5\' 1"  (1.549 m)  Wt 168 lb 8 oz (76.431 kg)  BMI 31.85 kg/m2  SpO2 96% Physical Exam  Constitutional: She is oriented to person, place, and time. She appears well-developed and well-nourished. No distress.  HENT:  Head: Normocephalic and atraumatic.  Eyes: EOM are normal.  Neck: Normal range of motion.  Cardiovascular: Normal rate, regular rhythm and normal heart sounds.   Pulmonary/Chest: Effort normal and breath sounds normal.  Abdominal: Soft. She exhibits no distension. There is no tenderness.  Musculoskeletal: Normal range of motion.  Neurological: She is alert and oriented to person, place, and time.  Skin: Skin is warm and dry.  Psychiatric: She has a normal mood and affect. Judgment normal.  Nursing note and vitals reviewed.   ED Course  Procedures (including critical care time)  DIAGNOSTIC STUDIES: Oxygen Saturation is 96% on RA, adequate by my interpretation.    COORDINATION OF CARE: 3:21 AM Discussed treatment  plan with pt at bedside and pt agreed to plan.   Labs Review Labs Reviewed  BASIC METABOLIC PANEL - Abnormal; Notable for the following:    Sodium 132 (*)    Potassium 2.8 (*)    Chloride 93 (*)    Creatinine, Ser 4.69 (*)    GFR calc non Af Amer 10 (*)    GFR calc Af Amer 12 (*)    All other components within normal limits  CBC    Imaging Review No results found. I have personally reviewed and evaluated these lab results as part of my medical decision-making.   EKG Interpretation None      MDM   Final diagnoses:  None    Patient with history of end-stage renal disease on hemodialysis who presents with headache and elevated blood pressure. She is been seen for this multiple times in the past. Her workup today reveals unremarkable laboratory studies with the exception of a low potassium which she appears to chronically run. She is a dialysis patient and I feel reluctant to overly aggressively replace this. She was given clonidine which helped her blood pressure somewhat. She will be prescribed this and discharged to home. My advice to her is to keep a record of her blood pressures and take this with her to her next doctor's appointment so that changes can be made to her medications. She underwent a CT scan approximately 2 months ago and I do not feel as though another is indicated. She is neurologically intact and in no distress.  Allena Napoleon, personally performed the services described in this documentation. All medical record entries made by the scribe were at my direction and in my presence.  I have reviewed the chart and discharge instructions and agree that the record reflects my personal performance and is accurate and complete. Veryl Speak.  03/28/2015. 5:42 AM.       Veryl Speak, MD 03/28/15 [phone]

## 2015-03-28 NOTE — Discharge Instructions (Signed)
Add clonidine as prescribed to your medication regimen.  Keep a record of your blood pressures to take with you to your next doctor's appointment so that further adjustments can be made.  Return to the emergency department if symptoms significantly worsen or change.   Hipertensin (Hypertension) La hipertensin, conocida comnmente como presin arterial alta, se produce cuando la sangre bombea en las arterias con mucha fuerza. Las arterias son los vasos sanguneos que transportan la sangre desde el corazn hacia todas las partes del cuerpo. Una lectura de la presin arterial consiste en un nmero ms alto sobre un nmero ms bajo, por ejemplo, 110/72. El nmero ms alto (presin sistlica) corresponde a la presin interna de las arterias cuando el corazn [place]. El nmero ms bajo (presin diastlica) corresponde a la presin interna de las arterias cuando el corazn se relaja. En condiciones ideales, la presin arterial debe ser inferior a 120/80. La hipertensin fuerza al corazn a trabajar ms para Herbalist. Las arterias pueden estrecharse o ponerse rgidas. La hipertensin conlleva el riesgo de enfermedad cardaca, ictus y otros problemas.  [place] de riesgo de hipertensin son controlables, pero otros no lo son.  [employer] factores de riesgo que usted no puede Chief Technology Officer, se incluyen:   Manufacturing systems engineer. El riesgo es mayor para las Retail banker.  La edad. Los riesgos aumentan con la edad.  El sexo. Antes de los 45aos, los hombres corren ms [employer]. Despus de los 65aos, las mujeres corren ms [employer]. Entre los factores de riesgo que usted puede Chief Technology Officer, se incluyen:  No hacer la cantidad suficiente de actividad fsica o ejercicio.  Tener sobrepeso.  Consumir mucha grasa, azcar, caloras o sal en la dieta.  Beber alcohol en exceso. SIGNOS Y SNTOMAS Por lo general, la hipertensin no causa signos o  sntomas. La hipertensin demasiado alta (crisis hipertensiva) puede causar dolor de cabeza, ansiedad, falta de aire y hemorragia nasal. DIAGNSTICO  Para detectar si usted tiene hipertensin, el mdico le medir la presin arterial mientras est sentado, con el brazo levantado a la altura del corazn. Debe medirla al [facility] veces en el mismo brazo. Determinadas condiciones pueden causar una diferencia de presin arterial entre el brazo izquierdo y Insurance underwriter. El hecho de tener una sola lectura de la presin arterial ms alta que lo normal no significa que Stage manager. En el caso de tener una lectura de la presin arterial con un valor alto, pdale al mdico que la verifique nuevamente. [place] hipertensin arterial incluye hacer cambios en el estilo de vida y, posiblemente, tomar medicamentos. Un estilo de vida saludable puede ayudar a bajar la presin arterial alta. Quiz deba cambiar algunos hbitos. Los [employer] en el estilo de vida pueden incluir:  Seguir la dieta DASH. Esta dieta tiene un alto contenido de frutas, verduras y Psychologist, prison and probation services. Incluye poca cantidad de sal, carnes rojas y azcares agregados.  Hacer al menos 2horas de actividad fsica enrgica todas las semanas.  Perder peso, si es necesario.  No fumar.  Limitar el consumo de bebidas alcohlicas.  Aprender formas de reducir el estrs. Si los cambios en el estilo de vida no son suficientes para Child psychotherapist la presin arterial, el mdico puede recetarle medicamentos. Quiz necesite tomar ms de uno. Trabaje en conjunto con su mdico para comprender los riesgos y los beneficios. INSTRUCCIONES PARA EL CUIDADO EN EL HOGAR  Haga que le midan de nuevo la  presin arterial segn las indicaciones del [place] los medicamentos solamente como se lo haya indicado el mdico. Siga cuidadosamente las indicaciones. Los medicamentos para la presin arterial deben tomarse segn las  indicaciones. Los medicamentos pierden eficacia al omitir las dosis. El hecho de omitir las dosis tambin [place] el riesgo de otros problemas.  No fume.  Contrlese la presin arterial en su casa segn las indicaciones del mdico. SOLICITE ATENCIN MDICA SI:   Piensa que tiene una reaccin alrgica a los medicamentos.  Tiene mareos o dolores de cabeza con Scientist, research (physical sciences).  Tiene hinchazn en los tobillos.  Tiene problemas de visin. SOLICITE ATENCIN MDICA DE INMEDIATO SI:  Siente un dolor de cabeza intenso o confusin.  Siente debilidad inusual, adormecimiento o que Geneticist, molecular.  Siente dolor intenso en el pecho o en el abdomen.  Vomita repetidas veces.  Tiene dificultad para respirar. ASEGRESE DE QUE:   Comprende estas instrucciones.  Controlar su afeccin.  Recibir ayuda de inmediato si no mejora o si empeora. Document Released: 06/11/2005 Document Revised: 10/26/2013 [facility] Patient Information 2015 [place], [place]. This information is not intended to replace advice given to you by your health care provider. Make sure you discuss any questions you have with your health care provider.

## 2015-03-28 NOTE — ED Notes (Signed)
Pt is a dialysis patient and states her daughter does her dialysis at home.  She has not missed dialysis and had a treatment today.  Pt has been checking her pressure at home and found a systolic of [id number] this evening and decided to come in.

## 2015-04-12 ENCOUNTER — Other Ambulatory Visit: Payer: Self-pay | Admitting: Nephrology

## 2015-04-12 ENCOUNTER — Ambulatory Visit
Admission: RE | Admit: 2015-04-12 | Discharge: 2015-04-12 | Disposition: A | Discharge status: Home / Self Care | Payer: Medicare Other | Source: Ambulatory Visit | Attending: Nephrology | Admitting: Nephrology

## 2015-04-12 DIAGNOSIS — M546 Pain in thoracic spine: Secondary | ICD-10-CM

## 2015-04-12 DIAGNOSIS — M545 Low back pain: Secondary | ICD-10-CM

## 2015-04-13 DIAGNOSIS — E11319 Type 2 diabetes mellitus with unspecified diabetic retinopathy without macular edema: Secondary | ICD-10-CM | POA: Insufficient documentation

## 2015-04-17 ENCOUNTER — Encounter (HOSPITAL_COMMUNITY): Payer: Self-pay | Admitting: *Deleted

## 2015-04-17 ENCOUNTER — Emergency Department (HOSPITAL_COMMUNITY)
Admission: EM | Admit: 2015-04-17 | Discharge: 2015-04-18 | Disposition: A | Discharge status: Home / Self Care | Payer: Medicare Other | Attending: Emergency Medicine | Admitting: Emergency Medicine

## 2015-04-17 DIAGNOSIS — R51 Headache: Secondary | ICD-10-CM | POA: Insufficient documentation

## 2015-04-17 DIAGNOSIS — Z862 Personal history of diseases of the blood and blood-forming organs and certain disorders involving the immune mechanism: Secondary | ICD-10-CM | POA: Insufficient documentation

## 2015-04-17 DIAGNOSIS — Z794 Long term (current) use of insulin: Secondary | ICD-10-CM | POA: Insufficient documentation

## 2015-04-17 DIAGNOSIS — R519 Headache, unspecified: Secondary | ICD-10-CM

## 2015-04-17 DIAGNOSIS — G709 Myoneural disorder, unspecified: Secondary | ICD-10-CM | POA: Diagnosis not present

## 2015-04-17 DIAGNOSIS — Z79899 Other long term (current) drug therapy: Secondary | ICD-10-CM | POA: Diagnosis not present

## 2015-04-17 DIAGNOSIS — K219 Gastro-esophageal reflux disease without esophagitis: Secondary | ICD-10-CM | POA: Insufficient documentation

## 2015-04-17 DIAGNOSIS — E1129 Type 2 diabetes mellitus with other diabetic kidney complication: Secondary | ICD-10-CM | POA: Diagnosis not present

## 2015-04-17 DIAGNOSIS — N2889 Other specified disorders of kidney and ureter: Secondary | ICD-10-CM

## 2015-04-17 DIAGNOSIS — I151 Hypertension secondary to other renal disorders: Secondary | ICD-10-CM | POA: Diagnosis not present

## 2015-04-17 DIAGNOSIS — R011 Cardiac murmur, unspecified: Secondary | ICD-10-CM | POA: Diagnosis not present

## 2015-04-17 DIAGNOSIS — Z7982 Long term (current) use of aspirin: Secondary | ICD-10-CM | POA: Insufficient documentation

## 2015-04-17 DIAGNOSIS — F329 Major depressive disorder, single episode, unspecified: Secondary | ICD-10-CM | POA: Insufficient documentation

## 2015-04-17 MED ORDER — METOCLOPRAMIDE HCL 10 MG PO TABS
10.0000 mg | ORAL_TABLET | Freq: Once | ORAL | Status: AC
  Administered 2015-04-18: 10 mg via ORAL
  Filled 2015-04-17: qty 1

## 2015-04-17 MED ORDER — DIPHENHYDRAMINE HCL 25 MG PO CAPS
50.0000 mg | ORAL_CAPSULE | Freq: Once | ORAL | Status: AC
  Administered 2015-04-17: 50 mg via ORAL
  Filled 2015-04-17: qty 2

## 2015-04-17 MED ORDER — IBUPROFEN 800 MG PO TABS
800.0000 mg | ORAL_TABLET | Freq: Once | ORAL | Status: AC
  Administered 2015-04-17: 800 mg via ORAL
  Filled 2015-04-17: qty 1

## 2015-04-17 NOTE — ED Notes (Signed)
The pt has a headache all day.  She has high bp.  Whenever her bp is high she gets a headache.  She has taken her bp med.

## 2015-04-17 NOTE — ED Provider Notes (Signed)
CSN: [id number]     Arrival date & time 04/17/15  2246 History  By signing my name below, I, Evelene Croon, attest that this documentation has been prepared under the direction and in the presence of Everlene Balls, MD . Electronically Signed: Evelene Croon, Scribe. 04/18/2015. 12:00 AM.  Chief Complaint  Patient presents with  . Headache   The history is provided by the patient and [name]. No language interpreter was used.    HPI Comments:  Cynthia [name] is a 44 y.o. female with a history of HTN, and IDDM, who presents to the Emergency Department complaining of an intermittent, throbbing, diffuse HA since this am. Pt reports associated   high BP, home machine read 240/70 and mild blurred vision. She was seen in the ED for similar symptoms on 03/28/15--workup was negative. Pt notes HA today is similar to past episodes with elevated BP. No alleviating factors noted.  Pt has a h/o ESRD and receives hemodialysis at home, she was last dialyzed 04/16/15. Pt is not a native [place] speaker, history translated by pt's daughter  Past Medical History  Diagnosis Date  . Hypertension   . ESRD on hemodialysis ([place])     Home HD 5x per week  . Depression   . 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 GERD (gastroesophageal reflux disease)   . Neuromuscular disorder ([place])     NEUROPATHY  . History of blood transfusion     transfusion reaction  . Anemia    Past Surgical History  Procedure Laterality Date  . Cholecystectomy    . Abdominal hysterectomy    . Breast surgery      biopsy bilateral  . Tubal ligation    . Dialysis fistula creation Left   . Eye surgery Bilateral     lazer  . Revison of arteriovenous fistula Left 09/28/2013    Procedure: EXCISE ESCHAR LEFT ARM  ARTERIOVENOUS FISTULA WITH PLICATION OF LEFT ARM ARTERIOVENOUS FISTULA;  Surgeon: Elam Dutch, MD;  Location: [place];  Service: Vascular;  Laterality: Left;  .  Left heart catheterization with coronary angiogram N/A 03/30/2014    Procedure: LEFT HEART CATHETERIZATION WITH CORONARY ANGIOGRAM;  Surgeon: Sinclair Grooms, MD;  Location: [facility] CATH LAB;  Service: Cardiovascular;  Laterality: N/A;   Family History  Problem Relation Age of Onset  . Cancer Maternal Grandmother   . Diabetes Maternal Grandfather   . Diabetes Paternal Grandmother   . Diabetes Mother   . Kidney disease Mother   . Diabetes Father   . Heart disease Father   . Kidney disease Father   . Diabetes Sister   . Asthma Sister   . Lupus Sister   . Diabetes Brother    Social History  Substance Use Topics  . Smoking status: Never Smoker   . Smokeless tobacco: Never Used  . Alcohol Use: No   OB History    No data available     Review of Systems  10 systems reviewed and all are negative for acute change except as noted in the HPI.  Allergies  Review of patient's allergies indicates no known allergies.  Home Medications   Prior to Admission medications   Medication Sig Start Date End Date Taking? Authorizing Provider  ALPRAZolam (XANAX) 0.25 MG tablet Take 0.25 mg by mouth 2 (two) times daily as needed for anxiety.    Historical Provider, MD  aspirin EC 81 MG EC tablet Take 1  tablet (81 mg total) by mouth daily. 03/31/14   Katheren Shams, DO  atorvastatin (LIPITOR) 10 MG tablet Take 10 mg by mouth daily.    Historical Provider, MD  calcitRIOL (ROCALTROL) 0.5 MCG capsule Take 0.5 mcg by mouth daily.    Historical Provider, MD  cinacalcet (SENSIPAR) 60 MG tablet Take 60 mg by mouth daily.    Historical Provider, MD  cloNIDine (CATAPRES) 0.2 MG tablet Take 1 tablet (0.2 mg total) by mouth 2 (two) times daily. 03/28/15   Veryl Speak, MD  docusate sodium (COLACE) 100 MG capsule Take 100 mg by mouth 2 (two) times daily.    Historical Provider, MD  gabapentin (NEURONTIN) 300 MG capsule Take 300 mg by mouth at bedtime. 11/16/14   Historical Provider, MD  Insulin Glargine (LANTUS  SOLOSTAR) 100 UNIT/ML Solostar Pen Inject 10 Units into the skin at bedtime.    Historical Provider, MD  labetalol (NORMODYNE) 100 MG tablet Take 1 tablet (100 mg total) by mouth 2 (two) times daily. Patient not taking: Reported on 03/28/2015 01/07/15   Barton Dubois, MD  Lanthanum Carbonate 1000 MG PACK Take 1,000 mg by mouth 3 (three) times daily with meals.     Historical Provider, MD  lisinopril (PRINIVIL,ZESTRIL) 5 MG tablet Take 2 tablets (10 mg total) by mouth daily. 01/07/15   Barton Dubois, MD  metoprolol succinate (TOPROL-XL) 25 MG 24 hr tablet Take 25 mg by mouth daily.    Historical Provider, MD  multivitamin (RENA-VIT) TABS tablet Take 1 tablet by mouth daily.    Historical Provider, MD  ondansetron (ZOFRAN) 4 MG tablet Take 4 mg by mouth every 8 (eight) hours as needed for nausea or vomiting.    Historical Provider, MD  pantoprazole (PROTONIX) 40 MG tablet Take 1 tablet (40 mg total) by mouth daily. Patient not taking: Reported on 03/28/2015 01/07/15   Barton Dubois, MD  polyethylene glycol powder (GLYCOLAX/MIRALAX) powder Take 17 g by mouth daily. Patient not taking: Reported on 03/28/2015 01/14/15   Geronimo Boot, MD  sevelamer (RENVELA) 800 MG tablet Take 800-4,000 mg by mouth 3 (three) times daily with meals. Take [phone] with snacks and 4000 with meals    Historical Provider, MD   BP 219/78 mmHg  Pulse 65  Temp(Src) 97.9 F (36.6 C) (Oral)  Resp 16  Ht 5\' 1"  (1.549 m)  Wt 164 lb 4 oz (74.503 kg)  BMI 31.05 kg/m2  SpO2 98% Physical Exam  Constitutional: She is oriented to person, place, and time. She appears well-developed and well-nourished. No distress.  HENT:  Head: Normocephalic and atraumatic.  Nose: Nose normal.  Mouth/Throat: Oropharynx is clear and moist. No oropharyngeal exudate.  Eyes: Conjunctivae and EOM are normal. Pupils are equal, round, and reactive to light. No scleral icterus.  Neck: Normal range of motion. Neck supple. No JVD present. No tracheal  deviation present. No thyromegaly present.  Cardiovascular: Normal rate and regular rhythm.  Exam reveals no gallop and no friction rub.   Murmur (holosystolic ) heard. Pulmonary/Chest: Effort normal and breath sounds normal. No respiratory distress. She has no wheezes. She exhibits no tenderness.  Abdominal: Soft. Bowel sounds are normal. She exhibits no distension and no mass. There is no tenderness. There is no rebound and no guarding.  Musculoskeletal: Normal range of motion. She exhibits no edema or tenderness.  Lymphadenopathy:    She has no cervical adenopathy.  Neurological: She is alert and oriented to person, place, and time. No cranial nerve deficit. She exhibits  normal muscle tone.  Normal strength and sensation to all extremities Normal cerebellar testing  Skin: Skin is warm and dry. No rash noted. No erythema. No pallor.  Nursing note and vitals reviewed.   ED Course  Procedures   DIAGNOSTIC STUDIES:  Oxygen Saturation is 99% on RA, normal by my interpretation.    COORDINATION OF CARE:  11:53 PM Will order pian meds. Discussed treatment plan with pt and daughter at bedside and pt agreed to plan.  Labs Review Labs Reviewed - No data to display  Imaging Review No results found.   EKG Interpretation None      MDM   Final diagnoses:  None   Patient presents to the emergency department for evaluation of headache and high blood pressure. Looking at her prior records, her blood pressure continues to remain around [id number] systolic. Tonight it is 219/178. She is getting more fluid taken off during dialysis and her primary care physician has added on blood pressure medications. Patient was given Reglan, Benadryl, Motrin for headache relief. We'll continue to monitor blood pressure. I do not believe patient needs an acute change in her medication, rather she is to follow with her primary care physician. She is not symptomatic from a neurological standpoint from her blood  pressure.  Upon repeat evaluation, patient was found sleeping comfortably in the room and in no acute distress. After waking her up she states that her headache has improved. Her blood pressure continues to be 220/78 without any evidence of end organ damage. Patient is advised to increase her clonidine to 0.2 twice per day and follow-up with her primary care physician within 3 days for repeat check. Patient and daughter demonstrates good understanding. She appears well in no acute distress, vital signs remain within her normal limits and she is safe for discharge.   I, Von Quintanar, personally performed the services described in this documentation. All medical record entries made by the scribe were at my direction and in my presence.  I have reviewed the chart and discharge instructions and agree that the record reflects my personal performance and is accurate and complete. Doretta Remmert.  04/18/2015. 1:13 AM.      Everlene Balls, MD 04/18/15 [id number]

## 2015-04-18 DIAGNOSIS — R51 Headache: Secondary | ICD-10-CM | POA: Diagnosis not present

## 2015-04-18 MED ORDER — CLONIDINE HCL 0.1 MG PO TABS
0.1000 mg | ORAL_TABLET | Freq: Once | ORAL | Status: AC
  Administered 2015-04-18: 0.1 mg via ORAL
  Filled 2015-04-18: qty 1

## 2015-04-18 MED ORDER — CLONIDINE HCL 0.1 MG PO TABS: 0.2000 mg | ORAL_TABLET | Freq: Two times a day (BID) | ORAL | Status: DC

## 2015-04-18 NOTE — Discharge Instructions (Signed)
Hipertensin (Hypertension)  Cynthia [name], take clonidine 0.2 mg twice per day for your high blood pressure. See your primary care physician within 3 days for close follow-up. If any symptoms worsen, come back to emergency department immediately. Thank you.  Cynthia [name], tomar clonidina 0,2 mg dos veces por da para Cynthia presin arterial alta. Consulte a su mdico de atencin primaria dentro de los 3 das de un seguimiento Physicist, medical. Si los sntomas empeoran, volver a Optician, dispensing. Gracias.  El trmino hipertensin es otra forma de denominar a Cynthia presin arterial elevada. Cynthia presin arterial elevada fuerza al corazn a trabajar ms para bombear Cynthia sangre. Una lectura de Cynthia presin arterial consta de dos nmeros: uno ms alto sobre uno ms bajo (por ejemplo, 110/72). CUIDADOS EN EL HOGAR   Haga que el mdico le tome nuevamente Cynthia presin arterial.  [place] los medicamentos solamente como se lo haya indicado el mdico. Siga cuidadosamente las indicaciones. Los medicamentos pierden eficacia si omite dosis. El hecho de omitir las dosis tambin [place] el riesgo de otros problemas.  No fume.  Contrlese Cynthia presin arterial en su casa como se lo haya indicado el mdico. SOLICITE AYUDA SI:  Piensa que tiene una reaccin a los medicamentos que est tomando.  Tiene mareos o dolores de cabeza reiterados.  Se le inflaman (hinchan) los tobillos.  Tiene problemas de visin. SOLICITE AYUDA DE INMEDIATO SI:   Tiene un dolor de cabeza muy intenso y est confundido.  Se siente dbil, aturdido o se desmaya.  Tiene dolor en el pecho o el estmago (abdominal).  Tiene vmitos.  No puede respirar [employer]. ASEGRESE DE QUE:   Comprende estas instrucciones.  Controlar su afeccin.  Recibir ayuda de inmediato si no mejora o si empeora.   Esta informacin no tiene Marine scientist el consejo del mdico. Asegrese de hacerle al mdico cualquier pregunta que tenga.   Document  Released: 11/29/2009 Document Revised: 06/16/2013 Elsevier Interactive Patient Education [employer].

## 2015-04-18 NOTE — ED Notes (Signed)
MD at bedside. 

## 2015-05-05 ENCOUNTER — Encounter: Payer: Self-pay | Admitting: Surgery

## 2015-05-09 ENCOUNTER — Ambulatory Visit: Payer: Medicare Other | Admitting: Surgery

## 2015-05-31 ENCOUNTER — Encounter: Payer: Self-pay | Admitting: Surgery

## 2015-06-06 ENCOUNTER — Ambulatory Visit (INDEPENDENT_AMBULATORY_CARE_PROVIDER_SITE_OTHER): Payer: Medicare Other | Admitting: Surgery

## 2015-06-06 ENCOUNTER — Encounter: Payer: Self-pay | Admitting: Surgery

## 2015-06-06 VITALS — BP 114/51 | HR 65 | Temp 98.3°F | Ht 61.0 in | Wt 150.2 lb

## 2015-06-06 DIAGNOSIS — Z992 Dependence on renal dialysis: Secondary | ICD-10-CM

## 2015-06-06 DIAGNOSIS — N186 End stage renal disease: Secondary | ICD-10-CM

## 2015-06-06 NOTE — Progress Notes (Signed)
Patient name: Cynthia [name] MRN: [id number] DOB: 1970-07-23 Sex: female     Chief Complaint  Patient presents with  . Re-evaluation    eval for BPG vs aneurysm repair - ref by Dr. Augustin Coupe     HISTORY OF PRESENT ILLNESS: The patient is on home dialysis 5 days a week.  This is done through a left upper arm fistula.  She had plication of an aneurysm but Dr. Oneida Alar approximate year ago.  She has felt progressive degenerative changes and a different portion of her fistula.  This is also been associated with bleeding.  She has undergone imaging studies by Dr. Augustin Coupe where he describes performing balloon venoplasty.  There is no central stenosis.  The patient suffers some insulin-dependent diabetes.  She is a nonsmoker.  Her hypertension is managed medically.  She is on a statin for hypercholesterolemia  Past Medical History  Diagnosis Date  . Hypertension   . ESRD on hemodialysis ([place])     Home HD 5x per week  . Depression   . 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 GERD (gastroesophageal reflux disease)   . Neuromuscular disorder ([place])     NEUROPATHY  . History of blood transfusion     transfusion reaction  . Anemia     Past Surgical History  Procedure Laterality Date  . Cholecystectomy    . Abdominal hysterectomy    . Breast surgery      biopsy bilateral  . Tubal ligation    . Dialysis fistula creation Left   . Eye surgery Bilateral     lazer  . Revison of arteriovenous fistula Left 09/28/2013    Procedure: EXCISE ESCHAR LEFT ARM  ARTERIOVENOUS FISTULA WITH PLICATION OF LEFT ARM ARTERIOVENOUS FISTULA;  Surgeon: Elam Dutch, MD;  Location: [facility];  Service: Vascular;  Laterality: Left;  . Left heart catheterization with coronary angiogram N/A 03/30/2014    Procedure: LEFT HEART CATHETERIZATION WITH CORONARY ANGIOGRAM;  Surgeon: Sinclair Grooms, MD;  Location: [facility] CATH LAB;  Service: Cardiovascular;  Laterality: N/A;     Social History   Social History  . Marital Status: Legally Separated    Spouse Name: N/A  . Number of Children: 3  . Years of Education: 9   Occupational History  . disabled    Social History Main Topics  . Smoking status: Never Smoker   . Smokeless tobacco: Never Used  . Alcohol Use: No  . Drug Use: No  . Sexual Activity: Yes   Other Topics Concern  . Not on file   Social History Narrative   Lives in home with daughters, grand daughter, son-in-law   Caffeine use - 1 cup coffee sometimes     Family History  Problem Relation Age of Onset  . Cancer Maternal Grandmother   . Diabetes Maternal Grandfather   . Diabetes Paternal Grandmother   . Diabetes Mother   . Kidney disease Mother   . Diabetes Father   . Heart disease Father   . Kidney disease Father   . Diabetes Sister   . Asthma Sister   . Lupus Sister   . Diabetes Brother     Allergies as of 06/06/2015  . (No Known Allergies)    Current Outpatient Prescriptions on File Prior to Visit  Medication Sig Dispense Refill  . aspirin EC 81 MG EC tablet Take 1 tablet (81 mg total) by mouth daily. 90 tablet 3  .  atorvastatin (LIPITOR) 10 MG tablet Take 10 mg by mouth daily.    . carvedilol (COREG) 12.5 MG tablet Take 25 mg by mouth 2 (two) times daily with a meal.     . cloNIDine (CATAPRES) 0.1 MG tablet Take 2 tablets (0.2 mg total) by mouth 2 (two) times daily. 10 tablet 0  . docusate sodium (COLACE) 100 MG capsule Take 100 mg by mouth 2 (two) times daily as needed for mild constipation.     Marland Kitchen FLUoxetine (PROZAC) 20 MG tablet Take 20 mg by mouth daily.    Marland Kitchen gabapentin (NEURONTIN) 300 MG capsule Take 300 mg by mouth at bedtime.  12  . Insulin Glargine (LANTUS SOLOSTAR) 100 UNIT/ML Solostar Pen Inject 10 Units into the skin at bedtime.    . multivitamin (RENA-VIT) TABS tablet Take 1 tablet by mouth daily.    . ondansetron (ZOFRAN) 4 MG tablet Take 4 mg by mouth every 8 (eight) hours as needed for nausea or  vomiting.    . pantoprazole (PROTONIX) 40 MG tablet Take 1 tablet (40 mg total) by mouth daily. 30 tablet 2  . sevelamer (RENVELA) 800 MG tablet Take 4,000 mg by mouth 2 (two) times daily with a meal.     . calcitRIOL (ROCALTROL) 0.5 MCG capsule Take 0.5 mcg by mouth daily.    . cinacalcet (SENSIPAR) 60 MG tablet Take 60 mg by mouth daily.    . [DISCONTINUED] labetalol (NORMODYNE) 100 MG tablet Take 1 tablet (100 mg total) by mouth 2 (two) times daily. (Patient not taking: Reported on 03/28/2015) 60 tablet 2  . [DISCONTINUED] lisinopril (PRINIVIL,ZESTRIL) 20 MG tablet Take 20 mg by mouth at bedtime.     No current facility-administered medications on file prior to visit.     REVIEW OF SYSTEMS: Cardiovascular: No chest pain, chest pressure, palpitations, orthopnea, or dyspnea on exertion.  Pulmonary: No productive cough, asthma or wheezing. Neurologic: No weakness, paresthesias, aphasia, or amaurosis.  Positive for dizziness. Hematologic: No bleeding problems or clotting disorders. Musculoskeletal: No joint pain or joint swelling. Gastrointestinal: No blood in stool or hematemesis Genitourinary: No dysuria or hematuria. Psychiatric:: No history of major depression. Integumentary: No rashes or ulcers. Constitutional: No fever or chills.  PHYSICAL EXAMINATION:   Vital signs are  Filed Vitals:   06/06/15 1131  BP: 114/51  Pulse: 65  Temp: 98.3 F (36.8 C)  TempSrc: Oral  Height: 5\' 1"  (1.549 m)  Weight: 150 lb 3.2 oz (68.13 kg)  SpO2: 100%   Body mass index is 28.39 kg/(m^2). General: The patient appears their stated age. HEENT:  No gross abnormalities Pulmonary:  Non labored breathing Musculoskeletal: There are no major deformities. Neurologic: No focal weakness or paresthesias are detected, Skin: There are no ulcer or rashes noted. Psychiatric: The patient has normal affect. Cardiovascular: Large aneurysmal changes at the antecubital crease and within the mid fistula.  There  is a good thrill within the fistula.   Diagnostic Studies None  Assessment: Aneurysmal left upper arm fistula Plan: I spoke to the patient through an interpreter as well as a daughter who speaks [place].  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  The one at the antecubital crease is still used for dialysis the middle one is not.  I discussed performing repair of both aneurysms at the same time versus doing this as a staged approach.  The family would like to proceed with a staged approach so as to avoid a temporary catheter.  I have  scheduled this for early January.  The plan is for aneurysm resection versus plication of the aneurysm in the midportion of the arm and leaving the aneurysm at the antecubital crease which can be dealt with later  V. Leia Alf, M.D. [facility] Office: [phone] Pager:  [phone]

## 2015-06-09 ENCOUNTER — Other Ambulatory Visit: Payer: Self-pay

## 2015-06-17 ENCOUNTER — Other Ambulatory Visit: Payer: Self-pay

## 2015-06-17 DIAGNOSIS — Z1231 Encounter for screening mammogram for malignant neoplasm of breast: Secondary | ICD-10-CM

## 2015-06-26 DIAGNOSIS — H9190 Unspecified hearing loss, unspecified ear: Secondary | ICD-10-CM

## 2015-06-26 HISTORY — DX: Unspecified hearing loss, unspecified ear: H91.90

## 2015-07-06 ENCOUNTER — Encounter (HOSPITAL_COMMUNITY): Payer: Self-pay | Admitting: *Deleted

## 2015-07-06 MED ORDER — DEXTROSE 5 % IV SOLN
1.5000 g | INTRAVENOUS | Status: AC
  Administered 2015-07-07: 1.5 g via INTRAVENOUS
  Filled 2015-07-06: qty 1.5

## 2015-07-06 NOTE — Progress Notes (Signed)
Interpreter services called, per Baker Janus.

## 2015-07-07 ENCOUNTER — Ambulatory Visit (HOSPITAL_COMMUNITY): Payer: Medicare Other | Admitting: Anesthesiology

## 2015-07-07 ENCOUNTER — Ambulatory Visit (HOSPITAL_COMMUNITY)
Admission: RE | Admit: 2015-07-07 | Discharge: 2015-07-07 | Disposition: A | Discharge status: Home / Self Care | Payer: Medicare Other | Source: Ambulatory Visit | Attending: Surgery | Admitting: Surgery

## 2015-07-07 ENCOUNTER — Encounter (HOSPITAL_COMMUNITY)
Admission: RE | Disposition: A | Discharge status: Home / Self Care | Payer: Self-pay | Source: Ambulatory Visit | Attending: Surgery

## 2015-07-07 ENCOUNTER — Encounter (HOSPITAL_COMMUNITY): Payer: Self-pay | Admitting: Surgery

## 2015-07-07 DIAGNOSIS — Z6829 Body mass index (BMI) 29.0-29.9, adult: Secondary | ICD-10-CM | POA: Insufficient documentation

## 2015-07-07 DIAGNOSIS — Z7982 Long term (current) use of aspirin: Secondary | ICD-10-CM | POA: Diagnosis not present

## 2015-07-07 DIAGNOSIS — I120 Hypertensive chronic kidney disease with stage 5 chronic kidney disease or end stage renal disease: Secondary | ICD-10-CM | POA: Diagnosis not present

## 2015-07-07 DIAGNOSIS — Z79899 Other long term (current) drug therapy: Secondary | ICD-10-CM | POA: Insufficient documentation

## 2015-07-07 DIAGNOSIS — T82898A Other specified complication of vascular prosthetic devices, implants and grafts, initial encounter: Secondary | ICD-10-CM | POA: Insufficient documentation

## 2015-07-07 DIAGNOSIS — Z794 Long term (current) use of insulin: Secondary | ICD-10-CM | POA: Insufficient documentation

## 2015-07-07 DIAGNOSIS — Y832 Surgical operation with anastomosis, bypass or graft as the cause of abnormal reaction of the patient, or of later complication, without mention of misadventure at the time of the procedure: Secondary | ICD-10-CM | POA: Diagnosis not present

## 2015-07-07 DIAGNOSIS — F329 Major depressive disorder, single episode, unspecified: Secondary | ICD-10-CM | POA: Diagnosis not present

## 2015-07-07 DIAGNOSIS — E1122 Type 2 diabetes mellitus with diabetic chronic kidney disease: Secondary | ICD-10-CM | POA: Insufficient documentation

## 2015-07-07 DIAGNOSIS — E1142 Type 2 diabetes mellitus with diabetic polyneuropathy: Secondary | ICD-10-CM | POA: Diagnosis not present

## 2015-07-07 DIAGNOSIS — Z992 Dependence on renal dialysis: Secondary | ICD-10-CM | POA: Insufficient documentation

## 2015-07-07 DIAGNOSIS — N186 End stage renal disease: Secondary | ICD-10-CM | POA: Insufficient documentation

## 2015-07-07 DIAGNOSIS — E7800 Pure hypercholesterolemia, unspecified: Secondary | ICD-10-CM | POA: Insufficient documentation

## 2015-07-07 DIAGNOSIS — K219 Gastro-esophageal reflux disease without esophagitis: Secondary | ICD-10-CM | POA: Diagnosis not present

## 2015-07-07 HISTORY — PX: RESECTION OF ARTERIOVENOUS FISTULA ANEURYSM: SHX6070

## 2015-07-07 HISTORY — DX: Anxiety disorder, unspecified: F41.9

## 2015-07-07 LAB — BASIC METABOLIC PANEL
Anion gap: 13 (ref 5–15)
BUN: 21 mg/dL — AB (ref 6–20)
CALCIUM: 8.6 mg/dL — AB (ref 8.9–10.3)
CO2: 26 mmol/L (ref 22–32)
Chloride: 94 mmol/L — ABNORMAL LOW (ref 101–111)
Creatinine, Ser: 4.39 mg/dL — ABNORMAL HIGH (ref 0.44–1.00)
GFR calc Af Amer: 13 mL/min — ABNORMAL LOW (ref >60)
GFR, EST NON AFRICAN AMERICAN: 11 mL/min — AB (ref >60)
GLUCOSE: 135 mg/dL — AB (ref 65–99)
Potassium: 3 mmol/L — ABNORMAL LOW (ref 3.5–5.1)
Sodium: 133 mmol/L — ABNORMAL LOW (ref 135–145)

## 2015-07-07 LAB — GLUCOSE, CAPILLARY
Glucose-Capillary: 112 mg/dL — ABNORMAL HIGH (ref 65–99)
Glucose-Capillary: 105 mg/dL — ABNORMAL HIGH (ref 65–99)
Glucose-Capillary: 136 mg/dL — ABNORMAL HIGH (ref 65–99)

## 2015-07-07 SURGERY — RESECTION OF ARTERIOVENOUS FISTULA ANEURYSM
Anesthesia: Monitor Anesthesia Care | Site: Arm Upper | Laterality: Left

## 2015-07-07 MED ORDER — ONDANSETRON HCL 4 MG/2ML IJ SOLN
INTRAMUSCULAR | Status: AC
  Filled 2015-07-07: qty 2

## 2015-07-07 MED ORDER — CARVEDILOL 12.5 MG PO TABS
12.5000 mg | ORAL_TABLET | Freq: Once | ORAL | Status: AC
  Administered 2015-07-07: 12.5 mg via ORAL

## 2015-07-07 MED ORDER — PROPOFOL 500 MG/50ML IV EMUL
INTRAVENOUS | Status: DC | PRN
  Administered 2015-07-07: 50 ug/kg/min via INTRAVENOUS
  Administered 2015-07-07: 25 ug/kg/min via INTRAVENOUS

## 2015-07-07 MED ORDER — OXYCODONE-ACETAMINOPHEN 5-325 MG PO TABS: 1.0000 | ORAL_TABLET | Freq: Four times a day (QID) | ORAL | Status: DC | PRN

## 2015-07-07 MED ORDER — SODIUM CHLORIDE 0.9 % IV SOLN
INTRAVENOUS | Status: DC
  Administered 2015-07-07 (×2): via INTRAVENOUS

## 2015-07-07 MED ORDER — HEPARIN SODIUM (PORCINE) 1000 UNIT/ML IJ SOLN
INTRAMUSCULAR | Status: DC | PRN
  Administered 2015-07-07: 4000 [IU] via INTRAVENOUS

## 2015-07-07 MED ORDER — MIDAZOLAM HCL 2 MG/2ML IJ SOLN
INTRAMUSCULAR | Status: AC
  Filled 2015-07-07: qty 2

## 2015-07-07 MED ORDER — CARVEDILOL 12.5 MG PO TABS
ORAL_TABLET | ORAL | Status: AC
  Filled 2015-07-07: qty 1

## 2015-07-07 MED ORDER — SODIUM CHLORIDE 0.9 % IJ SOLN
INTRAMUSCULAR | Status: AC
  Filled 2015-07-07: qty 10

## 2015-07-07 MED ORDER — LIDOCAINE-EPINEPHRINE (PF) 1 %-1:200000 IJ SOLN
INTRAMUSCULAR | Status: AC
  Filled 2015-07-07: qty 30

## 2015-07-07 MED ORDER — PROPOFOL 10 MG/ML IV BOLUS
INTRAVENOUS | Status: DC | PRN
  Administered 2015-07-07: 20 mg via INTRAVENOUS

## 2015-07-07 MED ORDER — PROMETHAZINE HCL 25 MG/ML IJ SOLN: 6.2500 mg | INTRAMUSCULAR | Status: DC | PRN

## 2015-07-07 MED ORDER — HYDROMORPHONE HCL 1 MG/ML IJ SOLN: 0.2500 mg | INTRAMUSCULAR | Status: DC | PRN

## 2015-07-07 MED ORDER — PROTAMINE SULFATE 10 MG/ML IV SOLN
INTRAVENOUS | Status: DC | PRN
  Administered 2015-07-07 (×5): 10 mg via INTRAVENOUS

## 2015-07-07 MED ORDER — LIDOCAINE-EPINEPHRINE (PF) 1 %-1:200000 IJ SOLN
INTRAMUSCULAR | Status: DC | PRN
  Administered 2015-07-07: 21 mL

## 2015-07-07 MED ORDER — MIDAZOLAM HCL 5 MG/5ML IJ SOLN
INTRAMUSCULAR | Status: DC | PRN
  Administered 2015-07-07 (×2): 0.5 mg via INTRAVENOUS

## 2015-07-07 MED ORDER — FENTANYL CITRATE (PF) 100 MCG/2ML IJ SOLN
INTRAMUSCULAR | Status: DC | PRN
  Administered 2015-07-07: 25 ug via INTRAVENOUS
  Administered 2015-07-07: 50 ug via INTRAVENOUS
  Administered 2015-07-07: 25 ug via INTRAVENOUS
  Administered 2015-07-07: 50 ug via INTRAVENOUS

## 2015-07-07 MED ORDER — EPHEDRINE SULFATE 50 MG/ML IJ SOLN
INTRAMUSCULAR | Status: AC
  Filled 2015-07-07: qty 1

## 2015-07-07 MED ORDER — CHLORHEXIDINE GLUCONATE CLOTH 2 % EX PADS: 6.0000 | MEDICATED_PAD | Freq: Once | CUTANEOUS | Status: DC

## 2015-07-07 MED ORDER — FENTANYL CITRATE (PF) 250 MCG/5ML IJ SOLN
INTRAMUSCULAR | Status: AC
  Filled 2015-07-07: qty 5

## 2015-07-07 MED ORDER — PHENYLEPHRINE HCL 10 MG/ML IJ SOLN
INTRAMUSCULAR | Status: DC | PRN
  Administered 2015-07-07 (×2): 40 ug via INTRAVENOUS

## 2015-07-07 MED ORDER — EPHEDRINE SULFATE 50 MG/ML IJ SOLN
INTRAMUSCULAR | Status: DC | PRN
  Administered 2015-07-07: 5 mg via INTRAVENOUS
  Administered 2015-07-07: 10 mg via INTRAVENOUS

## 2015-07-07 MED ORDER — PHENYLEPHRINE 40 MCG/ML (10ML) SYRINGE FOR IV PUSH (FOR BLOOD PRESSURE SUPPORT)
PREFILLED_SYRINGE | INTRAVENOUS | Status: AC
  Filled 2015-07-07: qty 10

## 2015-07-07 MED ORDER — 0.9 % SODIUM CHLORIDE (POUR BTL) OPTIME
TOPICAL | Status: DC | PRN
  Administered 2015-07-07: 1000 mL

## 2015-07-07 MED ORDER — DEXAMETHASONE SODIUM PHOSPHATE 4 MG/ML IJ SOLN
INTRAMUSCULAR | Status: AC
  Filled 2015-07-07: qty 2

## 2015-07-07 MED ORDER — HEPARIN SODIUM (PORCINE) 5000 UNIT/ML IJ SOLN
INTRAMUSCULAR | Status: DC | PRN
  Administered 2015-07-07: 500 mL

## 2015-07-07 SURGICAL SUPPLY — 33 items
CANISTER SUCTION 2500CC (MISCELLANEOUS) ×2 IMPLANT
CLIP TI MEDIUM 6 (CLIP) ×2 IMPLANT
CLIP TI WIDE RED SMALL 6 (CLIP) ×2 IMPLANT
COVER PROBE W GEL 5X96 (DRAPES) ×2 IMPLANT
ELECT REM PT RETURN 9FT ADLT (ELECTROSURGICAL) ×2
ELECTRODE REM PT RTRN 9FT ADLT (ELECTROSURGICAL) ×1 IMPLANT
FELT TEFLON 4 X1 (Mesh General) ×2 IMPLANT
GLOVE BIO SURGEON STRL SZ 6.5 (GLOVE) ×10 IMPLANT
GLOVE BIOGEL PI IND STRL 6.5 (GLOVE) ×4 IMPLANT
GLOVE BIOGEL PI IND STRL 7.0 (GLOVE) ×3 IMPLANT
GLOVE BIOGEL PI IND STRL 7.5 (GLOVE) ×1 IMPLANT
GLOVE BIOGEL PI INDICATOR 6.5 (GLOVE) ×4
GLOVE BIOGEL PI INDICATOR 7.0 (GLOVE) ×3
GLOVE BIOGEL PI INDICATOR 7.5 (GLOVE) ×1
GLOVE SURG SS PI 7.5 STRL IVOR (GLOVE) ×2 IMPLANT
GOWN STRL REUS W/ TWL LRG LVL3 (GOWN DISPOSABLE) ×2 IMPLANT
GOWN STRL REUS W/ TWL XL LVL3 (GOWN DISPOSABLE) ×1 IMPLANT
GOWN STRL REUS W/TWL LRG LVL3 (GOWN DISPOSABLE) ×4
GOWN STRL REUS W/TWL XL LVL3 (GOWN DISPOSABLE) ×1
HEMOSTAT SNOW SURGICEL 2X4 (HEMOSTASIS) IMPLANT
KIT BASIN OR (CUSTOM PROCEDURE TRAY) ×2 IMPLANT
KIT ROOM TURNOVER OR (KITS) ×2 IMPLANT
LIQUID BAND (GAUZE/BANDAGES/DRESSINGS) ×2 IMPLANT
NS IRRIG 1000ML POUR BTL (IV SOLUTION) ×2 IMPLANT
PACK CV ACCESS (CUSTOM PROCEDURE TRAY) ×2 IMPLANT
PAD ARMBOARD 7.5X6 YLW CONV (MISCELLANEOUS) ×4 IMPLANT
SUT PROLENE 5 0 C 1 24 (SUTURE) ×10 IMPLANT
SUT PROLENE 6 0 CC (SUTURE) ×2 IMPLANT
SUT VIC AB 3-0 SH 27 (SUTURE) ×1
SUT VIC AB 3-0 SH 27X BRD (SUTURE) ×1 IMPLANT
SUT VICRYL 4-0 PS2 18IN ABS (SUTURE) ×2 IMPLANT
UNDERPAD 30X30 INCONTINENT (UNDERPADS AND DIAPERS) ×2 IMPLANT
WATER STERILE IRR 1000ML POUR (IV SOLUTION) ×2 IMPLANT

## 2015-07-07 NOTE — Anesthesia Postprocedure Evaluation (Signed)
Anesthesia Post Note  Patient: Cynthia [name]  Procedure(s) Performed: Procedure(s) (LRB): REPAIR OF ARTERIOVENOUS FISTULA ANEURYSM (Left)  Patient location during evaluation: PACU Anesthesia Type: MAC Level of consciousness: awake and alert Pain management: pain level controlled Vital Signs Assessment: post-procedure vital signs reviewed and stable Respiratory status: spontaneous breathing, nonlabored ventilation, respiratory function stable and patient connected to nasal cannula oxygen Cardiovascular status: stable and blood pressure returned to baseline Anesthetic complications: no    Last Vitals:  Filed Vitals:   07/07/15 1330 07/07/15 1345  BP: 98/47 95/50  Pulse: 67 67  Temp:    Resp: 10 9    Last Pain:  Filed Vitals:   07/07/15 1353  PainSc: 0-No pain                 Liston Thum DANIEL

## 2015-07-07 NOTE — Op Note (Signed)
    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  They are getting bigger.  I discussed resecting both of them simultaneously, however they have had difficulty cannulating her up near her shoulder and therefore we decided to resect her aneurysms and a staged approach, in order to avoid placing a catheter.  The largest one was in the middle and so we would start with that first.  Procedure:  The patient was identified in the holding area and taken to [place] [facility]  The patient was then placed supine on the table. MAC anesthesia was administered.  The patient was prepped and draped in the usual sterile fashion.  A time out was called and antibiotics were administered.  One percent lidocaine was used for local anesthesia.  An elliptical incision was made around the large aneurysm in the midportion of the brachiocephalic fistula on the cephalic vein.  Using cautery I circumferentially exposed the aneurysm as well as normal caliber cephalic vein on either side.  Once everything was fully exposed the patient was fully heparinized.  After the heparin circulated, the vein was occluded.  I felt I could simply resected the aneurysm and then performed a end to end anastomosis.  The vein was marked for orientation.  It was opened with a #11 blade.  Curved Mayo scissors were then used to transect the fistula, proximal and distal to the aneurysm.  I then used a running 5-0 Prolene to perform a end to end anastomosis.  2 pledgeted  sutures were required for hemostasis.  Appropriate flushing maneuvers were performed and blood flow was reestablished to the fistula.  There was excellent thrill.  Protamine was given.  Once hemostasis was satisfactory, the subcutaneous tissue was closed with a 3-0 Vicryl, followed by 4-0 Vicryl on the skin and Dermabond.  There were no immediate complications.   Disposition:  To PACU in stable condition.   Theotis Burrow, M.D. [facility] Office: [phone] Pager:  [phone]

## 2015-07-07 NOTE — H&P (Signed)
Patient name: Cynthia [name]: [id number] DOB: 02/26/1972Sex: female    Chief Complaint  Patient presents with  . Re-evaluation    eval for BPG vs aneurysm repair - ref by Dr. Augustin Coupe     HISTORY OF PRESENT ILLNESS: The patient is on home dialysis 5 days a week. This is done through a left upper arm fistula. She had plication of an aneurysm but Dr. Oneida Alar approximate year ago. She has felt progressive degenerative changes and a different portion of her fistula. This is also been associated with bleeding. She has undergone imaging studies by Dr. Augustin Coupe where he describes performing balloon venoplasty. There is no central stenosis.  The patient suffers some insulin-dependent diabetes. She is a nonsmoker. Her hypertension is managed medically. She is on a statin for hypercholesterolemia  Past Medical History  Diagnosis Date  . Hypertension   . ESRD on hemodialysis ([place])     Home HD 5x per week  . Depression   . 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 GERD (gastroesophageal reflux disease)   . Neuromuscular disorder ([place])     NEUROPATHY  . History of blood transfusion     transfusion reaction  . Anemia     Past Surgical History  Procedure Laterality Date  . Cholecystectomy    . Abdominal hysterectomy    . Breast surgery      biopsy bilateral  . Tubal ligation    . Dialysis fistula creation Left   . Eye surgery Bilateral     lazer  . Revison of arteriovenous fistula Left 09/28/2013    Procedure: EXCISE ESCHAR LEFT ARM ARTERIOVENOUS FISTULA WITH PLICATION OF LEFT ARM ARTERIOVENOUS FISTULA; Surgeon: Elam Dutch, MD; Location: [facility]; Service: Vascular; Laterality: Left;  . Left heart catheterization with coronary angiogram N/A 03/30/2014    Procedure: LEFT HEART CATHETERIZATION WITH  CORONARY ANGIOGRAM; Surgeon: Sinclair Grooms, MD; Location: [facility] CATH LAB; Service: Cardiovascular; Laterality: N/A;    Social History   Social History  . Marital Status: Legally Separated    Spouse Name: N/A  . Number of Children: 3  . Years of Education: 9   Occupational History  . disabled    Social History Main Topics  . Smoking status: Never Smoker   . Smokeless tobacco: Never Used  . Alcohol Use: No  . Drug Use: No  . Sexual Activity: Yes   Other Topics Concern  . Not on file   Social History Narrative   Lives in home with daughters, grand daughter, son-in-law   Caffeine use - 1 cup coffee sometimes     Family History  Problem Relation Age of Onset  . Cancer Maternal Grandmother   . Diabetes Maternal Grandfather   . Diabetes Paternal Grandmother   . Diabetes Mother   . Kidney disease Mother   . Diabetes Father   . Heart disease Father   . Kidney disease Father   . Diabetes Sister   . Asthma Sister   . Lupus Sister   . Diabetes Brother     Allergies as of 06/06/2015  . (No Known Allergies)    Current Outpatient Prescriptions on File Prior to Visit  Medication Sig Dispense Refill  . aspirin EC 81 MG EC tablet Take 1 tablet (81 mg total) by mouth daily. 90 tablet 3  . atorvastatin (LIPITOR) 10 MG tablet Take 10 mg by mouth daily.    . carvedilol (COREG) 12.5 MG tablet Take  25 mg by mouth 2 (two) times daily with a meal.     . cloNIDine (CATAPRES) 0.1 MG tablet Take 2 tablets (0.2 mg total) by mouth 2 (two) times daily. 10 tablet 0  . docusate sodium (COLACE) 100 MG capsule Take 100 mg by mouth 2 (two) times daily as needed for mild constipation.     Marland Kitchen FLUoxetine (PROZAC) 20 MG tablet Take 20 mg by mouth daily.    Marland Kitchen gabapentin (NEURONTIN) 300 MG capsule Take 300 mg by mouth at bedtime.  12  .  Insulin Glargine (LANTUS SOLOSTAR) 100 UNIT/ML Solostar Pen Inject 10 Units into the skin at bedtime.    . multivitamin (RENA-VIT) TABS tablet Take 1 tablet by mouth daily.    . ondansetron (ZOFRAN) 4 MG tablet Take 4 mg by mouth every 8 (eight) hours as needed for nausea or vomiting.    . pantoprazole (PROTONIX) 40 MG tablet Take 1 tablet (40 mg total) by mouth daily. 30 tablet 2  . sevelamer (RENVELA) 800 MG tablet Take 4,000 mg by mouth 2 (two) times daily with a meal.     . calcitRIOL (ROCALTROL) 0.5 MCG capsule Take 0.5 mcg by mouth daily.    . cinacalcet (SENSIPAR) 60 MG tablet Take 60 mg by mouth daily.    . [DISCONTINUED] labetalol (NORMODYNE) 100 MG tablet Take 1 tablet (100 mg total) by mouth 2 (two) times daily. (Patient not taking: Reported on 03/28/2015) 60 tablet 2  . [DISCONTINUED] lisinopril (PRINIVIL,ZESTRIL) 20 MG tablet Take 20 mg by mouth at bedtime.     No current facility-administered medications on file prior to visit.     REVIEW OF SYSTEMS: Cardiovascular: No chest pain, chest pressure, palpitations, orthopnea, or dyspnea on exertion.  Pulmonary: No productive cough, asthma or wheezing. Neurologic: No weakness, paresthesias, aphasia, or amaurosis. Positive for dizziness. Hematologic: No bleeding problems or clotting disorders. Musculoskeletal: No joint pain or joint swelling. Gastrointestinal: No blood in stool or hematemesis Genitourinary: No dysuria or hematuria. Psychiatric:: No history of major depression. Integumentary: No rashes or ulcers. Constitutional: No fever or chills.  PHYSICAL EXAMINATION:  Vital signs are  Filed Vitals:   06/06/15 1131  BP: 114/51  Pulse: 65  Temp: 98.3 F (36.8 C)  TempSrc: Oral  Height: 5\' 1"  (1.549 m)  Weight: 150 lb 3.2 oz (68.13 kg)  SpO2: 100%   Body mass index is 28.39 kg/(m^2). General: The patient appears their stated age. HEENT: No gross  abnormalities Pulmonary: Non labored breathing Musculoskeletal: There are no major deformities. Neurologic: No focal weakness or paresthesias are detected, Skin: There are no ulcer or rashes noted. Psychiatric: The patient has normal affect. Cardiovascular: Large aneurysmal changes at the antecubital crease and within the mid fistula. There is a good thrill within the fistula.   Diagnostic Studies None  Assessment: Aneurysmal left upper arm fistula Plan: I spoke to the patient through an interpreter as well as a daughter who speaks [place]. 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 The one at the antecubital crease is still used for dialysis the middle one is not. I discussed performing repair of both aneurysms at the same time versus doing this as a staged approach. The family would like to proceed with a staged approach so as to avoid a temporary catheter. I have scheduled this for early January. The plan is for aneurysm resection versus plication of the aneurysm in the midportion of the arm and leaving the aneurysm at the antecubital crease which  can be dealt with later  V. Leia Alf, M.D. [facility] Office: [phone] Pager: [phone]       No changes from clinic visit CV:RRR Pulm:CTA Aneurysmal AVF Plan aneurysm resection.  All questions answered.  WElls Brabham

## 2015-07-07 NOTE — Anesthesia Preprocedure Evaluation (Addendum)
Anesthesia Evaluation  Patient identified by MRN, date of birth, ID band Patient awake    Reviewed: Allergy & Precautions, H&P , NPO status , Patient's Chart, lab work & pertinent test results  History of Anesthesia Complications Negative for: history of anesthetic complications  Airway Mallampati: II  TM Distance: >3 FB Neck ROM: Full    Dental  (+) Teeth Intact, Dental Advisory Given   Pulmonary neg pulmonary ROS,    breath sounds clear to auscultation       Cardiovascular hypertension, Pt. on medications (-) angina(-) Past MI and (-) CHF (-) dysrhythmias  Rhythm:Regular     Neuro/Psych PSYCHIATRIC DISORDERS Anxiety Depression negative neurological ROS     GI/Hepatic GERD  Medicated,  Endo/Other  diabetes, Type 2, Insulin DependentMorbid obesity  Renal/GU ESRF and DialysisRenal disease     Musculoskeletal   Abdominal   Peds  Hematology  (+) anemia ,   Anesthesia Other Findings   Reproductive/Obstetrics                            Anesthesia Physical  Anesthesia Plan  ASA: III  Anesthesia Plan: MAC   Post-op Pain Management:    Induction:   Airway Management Planned: Natural Airway  Additional Equipment: None  Intra-op Plan:   Post-operative Plan:   Informed Consent: I have reviewed the patients History and Physical, chart, labs and discussed the procedure including the risks, benefits and alternatives for the proposed anesthesia with the patient or authorized representative who has indicated his/her understanding and acceptance.   Dental advisory given  Plan Discussed with: Surgeon, CRNA and Anesthesiologist  Anesthesia Plan Comments:         Anesthesia Quick Evaluation

## 2015-07-07 NOTE — OR Nursing (Signed)
[place] [facility] charted under Jasmine Awe after [place] left the room.

## 2015-07-07 NOTE — Transfer of Care (Signed)
Immediate Anesthesia Transfer of Care Note  Patient: Cynthia [name]  Procedure(s) Performed: Procedure(s): REPAIR OF ARTERIOVENOUS FISTULA ANEURYSM (Left)  Patient Location: PACU  Anesthesia Type:MAC  Level of Consciousness: awake, sedated and patient cooperative  Airway & Oxygen Therapy: Patient Spontanous Breathing and Patient connected to nasal cannula oxygen  Post-op Assessment: Report given to RN and Post -op Vital signs reviewed and stable  Post vital signs: Reviewed and stable  Last Vitals:  Filed Vitals:   07/07/15 1257 07/07/15 1300  BP: 116/53   Pulse: 73 71  Temp:    Resp: 12 9    Complications: No apparent anesthesia complications

## 2015-07-07 NOTE — Progress Notes (Signed)
Interpreter Lesle Chris for Pre surgery for [place]

## 2015-07-08 ENCOUNTER — Encounter (HOSPITAL_COMMUNITY): Payer: Self-pay | Admitting: Surgery

## 2015-07-12 ENCOUNTER — Telehealth: Payer: Self-pay | Admitting: Surgery

## 2015-07-12 NOTE — Telephone Encounter (Signed)
-----   Message from Mena Goes, RN sent at 07/07/2015  3:28 PM EST ----- Regarding: schedule   ----- Message -----    From: Alvia Grove, PA-C    Sent: 07/07/2015  12:55 PM      To: [facility] Charge Pool  S/p repair of left AVF aneurysm 07/07/15  F/u with Dr. Trula Slade in 3 weeks  Thanks Maudie Mercury

## 2015-07-12 NOTE — Telephone Encounter (Signed)
Spoke with pt to schedule, dpm °

## 2015-07-15 ENCOUNTER — Ambulatory Visit
Admission: RE | Admit: 2015-07-15 | Discharge: 2015-07-15 | Disposition: A | Discharge status: Home / Self Care | Payer: Medicare Other | Source: Ambulatory Visit

## 2015-07-15 ENCOUNTER — Other Ambulatory Visit: Payer: Self-pay | Admitting: Family Medicine

## 2015-07-15 DIAGNOSIS — R928 Other abnormal and inconclusive findings on diagnostic imaging of breast: Secondary | ICD-10-CM

## 2015-07-15 DIAGNOSIS — Z1231 Encounter for screening mammogram for malignant neoplasm of breast: Secondary | ICD-10-CM

## 2015-07-20 ENCOUNTER — Ambulatory Visit
Admission: RE | Admit: 2015-07-20 | Discharge: 2015-07-20 | Disposition: A | Discharge status: Home / Self Care | Payer: Medicare Other | Source: Ambulatory Visit | Attending: Family Medicine | Admitting: Family Medicine

## 2015-07-20 ENCOUNTER — Other Ambulatory Visit: Payer: Self-pay | Admitting: Family Medicine

## 2015-07-20 DIAGNOSIS — R928 Other abnormal and inconclusive findings on diagnostic imaging of breast: Secondary | ICD-10-CM

## 2015-07-25 ENCOUNTER — Encounter: Payer: Self-pay | Admitting: Surgery

## 2015-07-28 ENCOUNTER — Other Ambulatory Visit: Payer: Self-pay | Admitting: Family Medicine

## 2015-07-28 DIAGNOSIS — R928 Other abnormal and inconclusive findings on diagnostic imaging of breast: Secondary | ICD-10-CM

## 2015-07-29 ENCOUNTER — Ambulatory Visit
Admission: RE | Admit: 2015-07-29 | Discharge: 2015-07-29 | Disposition: A | Discharge status: Home / Self Care | Payer: No Typology Code available for payment source | Source: Ambulatory Visit | Attending: Family Medicine | Admitting: Family Medicine

## 2015-07-29 ENCOUNTER — Other Ambulatory Visit: Payer: Self-pay | Admitting: Family Medicine

## 2015-07-29 ENCOUNTER — Ambulatory Visit
Admission: RE | Admit: 2015-07-29 | Discharge: 2015-07-29 | Disposition: A | Discharge status: Home / Self Care | Payer: Medicare Other | Source: Ambulatory Visit | Attending: Family Medicine | Admitting: Family Medicine

## 2015-07-29 DIAGNOSIS — R928 Other abnormal and inconclusive findings on diagnostic imaging of breast: Secondary | ICD-10-CM

## 2015-08-01 ENCOUNTER — Other Ambulatory Visit: Payer: Self-pay

## 2015-08-01 ENCOUNTER — Encounter: Payer: Self-pay | Admitting: Surgery

## 2015-08-01 ENCOUNTER — Ambulatory Visit (INDEPENDENT_AMBULATORY_CARE_PROVIDER_SITE_OTHER): Payer: Medicare Other | Admitting: Surgery

## 2015-08-01 VITALS — BP 137/71 | HR 69 | Temp 97.7°F | Ht 67.0 in | Wt 158.8 lb

## 2015-08-01 DIAGNOSIS — T82510D Breakdown (mechanical) of surgically created arteriovenous fistula, subsequent encounter: Secondary | ICD-10-CM

## 2015-08-01 DIAGNOSIS — T82898D Other specified complication of vascular prosthetic devices, implants and grafts, subsequent encounter: Secondary | ICD-10-CM

## 2015-08-01 NOTE — Progress Notes (Signed)
Patient name: Cynthia [name] MRN: [id number] DOB: [birth year] Sex: female     Chief Complaint  Patient presents with  . Re-evaluation    3 wk f/u s/p repair of L AVF aneurysm     HISTORY OF PRESENT ILLNESS:  patient is back for follow-up.  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 This was done as stage I of a 2 staged procedure.  She has another large aneurysm on her arm but I did not feel that I couldn't exclude both aneurysms without having to place a catheter.  She is back today without complaints  Past Medical History  Diagnosis Date  . Hypertension   . ESRD on hemodialysis ([place])     Home HD 5x per week  . Depression   . 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 GERD (gastroesophageal reflux disease)   . Neuromuscular disorder ([place])     NEUROPATHY  . History of blood transfusion     transfusion reaction  . Anemia   . Anxiety     Past Surgical History  Procedure Laterality Date  . Cholecystectomy    . Abdominal hysterectomy    . Breast surgery      biopsy bilateral  . Tubal ligation    . Dialysis fistula creation Left   . Eye surgery Bilateral     lazer  . Revison of arteriovenous fistula Left 09/28/2013    Procedure: EXCISE ESCHAR LEFT ARM  ARTERIOVENOUS FISTULA WITH PLICATION OF LEFT ARM ARTERIOVENOUS FISTULA;  Surgeon: Elam Dutch, MD;  Location: [place];  Service: Vascular;  Laterality: Left;  . Left heart catheterization with coronary angiogram N/A 03/30/2014    Procedure: LEFT HEART CATHETERIZATION WITH CORONARY ANGIOGRAM;  Surgeon: Sinclair Grooms, MD;  Location: [facility] CATH LAB;  Service: Cardiovascular;  Laterality: N/A;  . Resection of arteriovenous fistula aneurysm Left 07/07/2015    Procedure: REPAIR OF ARTERIOVENOUS FISTULA ANEURYSM;  Surgeon: Serafina Mitchell, MD;  Location: [facility] OR;  Service: Vascular;  Laterality: Left;    Social History   Social History  . Marital Status:  Legally Separated    Spouse Name: N/A  . Number of Children: 3  . Years of Education: 9   Occupational History  . disabled    Social History Main Topics  . Smoking status: Never Smoker   . Smokeless tobacco: Never Used  . Alcohol Use: No  . Drug Use: No  . Sexual Activity: Yes   Other Topics Concern  . Not on file   Social History Narrative   Lives in home with daughters, grand daughter, son-in-law   Caffeine use - 1 cup coffee sometimes     Family History  Problem Relation Age of Onset  . Cancer Maternal Grandmother   . Diabetes Maternal Grandfather   . Diabetes Paternal Grandmother   . Diabetes Mother   . Kidney disease Mother   . Diabetes Father   . Heart disease Father   . Kidney disease Father   . Diabetes Sister   . Asthma Sister   . Lupus Sister   . Diabetes Brother     Allergies as of 08/01/2015 - Review Complete 08/01/2015  Allergen Reaction Noted  . Tape Other (See Comments) 06/20/2015    Current Outpatient Prescriptions on File Prior to Visit  Medication Sig Dispense Refill  . aspirin EC 81 MG EC tablet Take 1 tablet (81 mg total) by mouth  daily. 90 tablet 3  . atorvastatin (LIPITOR) 10 MG tablet Take 10 mg by mouth daily.    . calcitRIOL (ROCALTROL) 0.5 MCG capsule Take 0.5 mcg by mouth daily.    . carvedilol (COREG) 12.5 MG tablet Take 25 mg by mouth 2 (two) times daily with a meal.     . cinacalcet (SENSIPAR) 60 MG tablet Take 60 mg by mouth daily.    . cloNIDine (CATAPRES) 0.1 MG tablet Take 2 tablets (0.2 mg total) by mouth 2 (two) times daily. 10 tablet 0  . docusate sodium (COLACE) 100 MG capsule Take 100 mg by mouth 2 (two) times daily as needed for mild constipation.     Marland Kitchen FLUoxetine (PROZAC) 20 MG tablet Take 20 mg by mouth daily.    . fluticasone (FLONASE) 50 MCG/ACT nasal spray Place 1 spray into the nose daily as needed.    . gabapentin (NEURONTIN) 300 MG capsule Take 300 mg by mouth at bedtime.  12  . Insulin Glargine (LANTUS SOLOSTAR)  100 UNIT/ML Solostar Pen Inject 10 Units into the skin at bedtime.    . metoCLOPramide (REGLAN) 5 MG tablet Take 5 mg by mouth daily.  3  . multivitamin (RENA-VIT) TABS tablet Take 1 tablet by mouth daily.    . ondansetron (ZOFRAN) 4 MG tablet Take 4 mg by mouth every 8 (eight) hours as needed for nausea or vomiting.    Marland Kitchen oxyCODONE-acetaminophen (ROXICET) 5-325 MG tablet Take 1 tablet by mouth every 6 (six) hours as needed. 20 tablet 0  . sevelamer (RENVELA) 800 MG tablet Take 2,400 mg by mouth 2 (two) times daily with a meal.     . [DISCONTINUED] labetalol (NORMODYNE) 100 MG tablet Take 1 tablet (100 mg total) by mouth 2 (two) times daily. (Patient not taking: Reported on 03/28/2015) 60 tablet 2  . [DISCONTINUED] lisinopril (PRINIVIL,ZESTRIL) 20 MG tablet Take 20 mg by mouth at bedtime.     No current facility-administered medications on file prior to visit.       PHYSICAL EXAMINATION:   Vital signs are  Filed Vitals:   08/01/15 1204  BP: 137/71  Pulse: 69  Temp: 97.7 F (36.5 C)  TempSrc: Oral  Height: 5\' 7"  (1.702 m)  Weight: 158 lb 12.8 oz (72.031 kg)  SpO2: 100%   Body mass index is 24.87 kg/(m^2). General: The patient appears their stated age.  there remains a great thrill within her left brachiocephalic fistula.  Her incisions are healing nicely.  There is a residual large aneurysm near the arteriovenous anastomosis  Diagnostic Studies  none  Assessment:  end-stage renal disease Plan:  the patient will be scheduled to have the remaining aneurysm addressed in the operating room in early March.  She is trying to coordinate with her schedule.  The fistula where I resected the aneurysm can be utilized in 2 weeks.  Eldridge Abrahams, M.D. [facility] [place] Office: [phone] Pager:  [phone]

## 2015-08-23 ENCOUNTER — Encounter (HOSPITAL_COMMUNITY): Payer: Self-pay | Admitting: *Deleted

## 2015-08-23 ENCOUNTER — Ambulatory Visit: Payer: Self-pay | Admitting: Surgery

## 2015-08-23 DIAGNOSIS — R928 Other abnormal and inconclusive findings on diagnostic imaging of breast: Secondary | ICD-10-CM

## 2015-08-23 NOTE — Progress Notes (Signed)
I completed SDW call using [address], [place], [place] # [id number].

## 2015-08-23 NOTE — H&P (Signed)
  History of Present Illness Cynthia [name]. Laramie Gelles MD; 08/23/2015 4:42 PM) Patient words: breast.  The patient is a 45 year old female who presents with a complaint of Breast problems. This is a 45 yo female who is s/p excision of an infected sebaceous cyst of the posterior scalp by Dr. Redmond Pulling. She recently underwent a routine screening mammogram on 07/15/15 that showed a possible mass in the right breast. On 07/20/15 she underwent diagnostic mammogram and ultrasound which showed an area of persistent distortion at 9:30 in the right breast 5 cm from the nipple measuring 1.5 cm in size. She had a previous lumpectomy near this area. However ultrasound confirmed that this was in an area different than the lumpectomy. She underwent core biopsy of this area with placement of a clip. Pathology showed breast parenchyma with fibrocystic changes and focal usual ductal hyperplasia. However the findings were felt to be discordant with the mammogram. She is now referred for lumpectomy.  The patient is on hemodialysis 5 days a week at home. She is on the [facility] list at [facility].  Previous lumpectomy in 2007 for palpable mass - findings benign  Family history - negative Menarche - 65 First pregnancy - 17 Breast feeding - yes Hormones - 6 years   Allergies (Ammie Eversole, LPN; D34-534 075-GRM PM) No Known Drug Allergies 06/08/2015  Medication History (Ammie Eversole, LPN; D34-534 075-GRM PM) Doxycycline Hyclate (100MG  Capsule, 1 (one) Oral two times daily, Taken starting 07/20/2015) Active. Sensipar (60MG  Tablet, Oral) Active. CloNIDine HCl (0.1MG  Tablet, Oral) Active. DocQLace (100MG  Capsule, Oral) Active. Metoclopramide HCl (5MG  Tablet, Oral) Active. Rena-Vite (Oral) Active. Carvedilol (12.5MG  Tablet, Oral) Active. Ondansetron HCl (4MG  Tablet, Oral) Active. BD Pen Needle Nano U/F (32G X 4 MM Misc,) Active. Fluticasone Propionate (50MCG/ACT Suspension, Nasal)  Active. Gabapentin (300MG  Capsule, Oral) Active. Renvela (800MG  Tablet, Oral) Active. Medications Reconciled    Vitals (Ammie Eversole LPN; D34-534 075-GRM PM) 08/23/2015 4:10 PM Weight: 161.2 lb Height: 61in Body Surface Area: 1.72 m Body Mass Index: 30.46 kg/m  Pulse: 82 (Regular)  BP: 152/80 (Sitting, Left Arm, Standard)      Physical Exam Rodman Key K. Malakie Balis MD; 08/23/2015 4:41 PM)  The physical exam findings are as follows: Note:WDWN in NAD HEENT: EOMI, sclera anicteric Neck: No masses, posterior scalp - healed surgical site with no sign of infection Lungs: CTA bilaterally; normal respiratory effort R breast - healed lumpectomy incision - slight bruising from biopsy No palpable masses; no axillary lymphadenopathy; no nipple retraction or discharge Left breast - no axillary lymphadenopathy; no palpable masses; no nipple retraction or discharge CV: Regular rate and rhythm; no murmurs Abd: +bowel sounds, soft, non-tender, no masses Ext: Well-perfused; no edema Skin: Warm, dry; no sign of jaundice    Assessment & Plan Rodman Key K. Kirstie Larsen MD; 08/23/2015 4:37 PM)  ABNORMALITY OF RIGHT BREAST ON SCREENING MAMMOGRAM (R92.8)  Current Plans Schedule for Surgery - Right radioactive seed localized lumpectomy. The surgical procedure has been discussed with the patient. Potential risks, benefits, alternative treatments, and expected outcomes have been explained. All of the patient's questions at this time have been answered. The likelihood of reaching the patient's treatment goal is good. The patient understand the proposed surgical procedure and wishes to proceed. Pt Education - [facility] Breast Biopsy HCI: discussed with patient and provided information.  Cynthia [name]. Georgette Dover, MD, [facility] Surgery  General/ Trauma Surgery  08/23/2015 4:43 PM

## 2015-08-24 HISTORY — PX: BREAST EXCISIONAL BIOPSY: SUR124

## 2015-08-25 ENCOUNTER — Other Ambulatory Visit: Payer: Self-pay | Admitting: Surgery

## 2015-08-25 DIAGNOSIS — R928 Other abnormal and inconclusive findings on diagnostic imaging of breast: Secondary | ICD-10-CM

## 2015-08-25 MED ORDER — DEXTROSE 5 % IV SOLN: 1.5000 g | INTRAVENOUS | Status: DC

## 2015-08-25 MED ORDER — DEXTROSE 5 % IV SOLN
1.5000 g | INTRAVENOUS | Status: AC
  Administered 2015-08-26: 1.5 g via INTRAVENOUS
  Filled 2015-08-25: qty 1.5

## 2015-08-25 MED ORDER — CHLORHEXIDINE GLUCONATE CLOTH 2 % EX PADS: 6.0000 | MEDICATED_PAD | Freq: Once | CUTANEOUS | Status: DC

## 2015-08-25 NOTE — Progress Notes (Signed)
I called pt's daughter, Jelesa Saeger and left message on VM informing her of time change for pt's surgery for tomorrow. Instructed her that pt needs to be here at 7:30 AM.

## 2015-08-26 ENCOUNTER — Encounter (HOSPITAL_COMMUNITY): Payer: Self-pay | Admitting: *Deleted

## 2015-08-26 ENCOUNTER — Ambulatory Visit (HOSPITAL_COMMUNITY): Payer: Medicare Other | Admitting: Anesthesiology

## 2015-08-26 ENCOUNTER — Encounter (HOSPITAL_COMMUNITY)
Admission: RE | Disposition: A | Discharge status: Home / Self Care | Payer: Self-pay | Source: Ambulatory Visit | Attending: Surgery

## 2015-08-26 ENCOUNTER — Ambulatory Visit (HOSPITAL_COMMUNITY)
Admission: RE | Admit: 2015-08-26 | Discharge: 2015-08-26 | Disposition: A | Discharge status: Home / Self Care | Payer: Medicare Other | Source: Ambulatory Visit | Attending: Surgery | Admitting: Surgery

## 2015-08-26 DIAGNOSIS — E1140 Type 2 diabetes mellitus with diabetic neuropathy, unspecified: Secondary | ICD-10-CM | POA: Insufficient documentation

## 2015-08-26 DIAGNOSIS — D649 Anemia, unspecified: Secondary | ICD-10-CM | POA: Insufficient documentation

## 2015-08-26 DIAGNOSIS — Z794 Long term (current) use of insulin: Secondary | ICD-10-CM | POA: Diagnosis not present

## 2015-08-26 DIAGNOSIS — E1122 Type 2 diabetes mellitus with diabetic chronic kidney disease: Secondary | ICD-10-CM | POA: Diagnosis not present

## 2015-08-26 DIAGNOSIS — Z79899 Other long term (current) drug therapy: Secondary | ICD-10-CM | POA: Diagnosis not present

## 2015-08-26 DIAGNOSIS — N186 End stage renal disease: Secondary | ICD-10-CM | POA: Insufficient documentation

## 2015-08-26 DIAGNOSIS — Z7982 Long term (current) use of aspirin: Secondary | ICD-10-CM | POA: Insufficient documentation

## 2015-08-26 DIAGNOSIS — F329 Major depressive disorder, single episode, unspecified: Secondary | ICD-10-CM | POA: Insufficient documentation

## 2015-08-26 DIAGNOSIS — Z992 Dependence on renal dialysis: Secondary | ICD-10-CM | POA: Insufficient documentation

## 2015-08-26 DIAGNOSIS — K219 Gastro-esophageal reflux disease without esophagitis: Secondary | ICD-10-CM | POA: Insufficient documentation

## 2015-08-26 DIAGNOSIS — T82898A Other specified complication of vascular prosthetic devices, implants and grafts, initial encounter: Secondary | ICD-10-CM | POA: Diagnosis present

## 2015-08-26 DIAGNOSIS — T82898S Other specified complication of vascular prosthetic devices, implants and grafts, sequela: Secondary | ICD-10-CM | POA: Diagnosis not present

## 2015-08-26 DIAGNOSIS — Y832 Surgical operation with anastomosis, bypass or graft as the cause of abnormal reaction of the patient, or of later complication, without mention of misadventure at the time of the procedure: Secondary | ICD-10-CM | POA: Diagnosis not present

## 2015-08-26 DIAGNOSIS — I120 Hypertensive chronic kidney disease with stage 5 chronic kidney disease or end stage renal disease: Secondary | ICD-10-CM | POA: Insufficient documentation

## 2015-08-26 HISTORY — PX: REVISON OF ARTERIOVENOUS FISTULA: SHX6074

## 2015-08-26 LAB — GLUCOSE, CAPILLARY
Glucose-Capillary: 134 mg/dL — ABNORMAL HIGH (ref 65–99)
Glucose-Capillary: 113 mg/dL — ABNORMAL HIGH (ref 65–99)

## 2015-08-26 LAB — POCT I-STAT 4, (NA,K, GLUC, HGB,HCT)
Glucose, Bld: 91 mg/dL (ref 65–99)
HEMATOCRIT: 34 % — AB (ref 36.0–46.0)
HEMOGLOBIN: 11.6 g/dL — AB (ref 12.0–15.0)
Potassium: 3.4 mmol/L — ABNORMAL LOW (ref 3.5–5.1)
Sodium: 135 mmol/L (ref 135–145)

## 2015-08-26 SURGERY — REVISON OF ARTERIOVENOUS FISTULA
Anesthesia: General | Site: Arm Upper | Laterality: Left

## 2015-08-26 MED ORDER — PROTAMINE SULFATE 10 MG/ML IV SOLN
INTRAVENOUS | Status: DC | PRN
  Administered 2015-08-26: 25 mg via INTRAVENOUS

## 2015-08-26 MED ORDER — SUCCINYLCHOLINE CHLORIDE 20 MG/ML IJ SOLN
INTRAMUSCULAR | Status: AC
  Filled 2015-08-26: qty 1

## 2015-08-26 MED ORDER — FENTANYL CITRATE (PF) 100 MCG/2ML IJ SOLN
INTRAMUSCULAR | Status: DC | PRN
  Administered 2015-08-26: 50 ug via INTRAVENOUS
  Administered 2015-08-26: 100 ug via INTRAVENOUS

## 2015-08-26 MED ORDER — SODIUM CHLORIDE 0.9 % IJ SOLN
INTRAMUSCULAR | Status: AC
  Filled 2015-08-26: qty 10

## 2015-08-26 MED ORDER — ALBUMIN HUMAN 5 % IV SOLN
INTRAVENOUS | Status: AC
  Administered 2015-08-26: 12.5 g via INTRAVENOUS
  Filled 2015-08-26: qty 250

## 2015-08-26 MED ORDER — ROCURONIUM BROMIDE 50 MG/5ML IV SOLN
INTRAVENOUS | Status: AC
  Filled 2015-08-26: qty 1

## 2015-08-26 MED ORDER — SODIUM CHLORIDE 0.9 % IV SOLN
INTRAVENOUS | Status: DC
  Administered 2015-08-26 (×2): via INTRAVENOUS

## 2015-08-26 MED ORDER — SODIUM CHLORIDE 0.9 % IV SOLN
INTRAVENOUS | Status: DC | PRN
  Administered 2015-08-26: 500 mL

## 2015-08-26 MED ORDER — FENTANYL CITRATE (PF) 250 MCG/5ML IJ SOLN
INTRAMUSCULAR | Status: AC
  Filled 2015-08-26: qty 5

## 2015-08-26 MED ORDER — ALBUMIN HUMAN 5 % IV SOLN
12.5000 g | Freq: Once | INTRAVENOUS | Status: AC
  Administered 2015-08-26: 12.5 g via INTRAVENOUS

## 2015-08-26 MED ORDER — PROPOFOL 10 MG/ML IV BOLUS
INTRAVENOUS | Status: DC | PRN
  Administered 2015-08-26: 110 mg via INTRAVENOUS

## 2015-08-26 MED ORDER — LIDOCAINE HCL (CARDIAC) 20 MG/ML IV SOLN
INTRAVENOUS | Status: AC
  Filled 2015-08-26: qty 5

## 2015-08-26 MED ORDER — PROTAMINE SULFATE 10 MG/ML IV SOLN
INTRAVENOUS | Status: AC
  Filled 2015-08-26: qty 5

## 2015-08-26 MED ORDER — MIDAZOLAM HCL 2 MG/2ML IJ SOLN
INTRAMUSCULAR | Status: AC
  Filled 2015-08-26: qty 2

## 2015-08-26 MED ORDER — HEPARIN SODIUM (PORCINE) 1000 UNIT/ML IJ SOLN
INTRAMUSCULAR | Status: DC | PRN
  Administered 2015-08-26: 3000 [IU] via INTRAVENOUS

## 2015-08-26 MED ORDER — HYDROMORPHONE HCL 1 MG/ML IJ SOLN: 0.2500 mg | INTRAMUSCULAR | Status: DC | PRN

## 2015-08-26 MED ORDER — ONDANSETRON HCL 4 MG/2ML IJ SOLN: INTRAMUSCULAR | Status: DC | PRN

## 2015-08-26 MED ORDER — 0.9 % SODIUM CHLORIDE (POUR BTL) OPTIME
TOPICAL | Status: DC | PRN
  Administered 2015-08-26: 1000 mL

## 2015-08-26 MED ORDER — PROPOFOL 10 MG/ML IV BOLUS
INTRAVENOUS | Status: AC
  Filled 2015-08-26: qty 20

## 2015-08-26 MED ORDER — CARVEDILOL 12.5 MG PO TABS
25.0000 mg | ORAL_TABLET | Freq: Once | ORAL | Status: AC
  Administered 2015-08-26: 25 mg via ORAL
  Filled 2015-08-26: qty 2

## 2015-08-26 MED ORDER — HEMOSTATIC AGENTS (NO CHARGE) OPTIME
TOPICAL | Status: DC | PRN
  Administered 2015-08-26: 1 via TOPICAL

## 2015-08-26 MED ORDER — DEXTROSE 5 % IV SOLN
10.0000 mg | INTRAVENOUS | Status: DC | PRN
  Administered 2015-08-26: 10 ug/min via INTRAVENOUS

## 2015-08-26 MED ORDER — MIDAZOLAM HCL 5 MG/5ML IJ SOLN
INTRAMUSCULAR | Status: DC | PRN
  Administered 2015-08-26: 1 mg via INTRAVENOUS

## 2015-08-26 MED ORDER — ONDANSETRON HCL 4 MG/2ML IJ SOLN
INTRAMUSCULAR | Status: AC
  Filled 2015-08-26: qty 2

## 2015-08-26 MED ORDER — LIDOCAINE HCL (CARDIAC) 20 MG/ML IV SOLN
INTRAVENOUS | Status: DC | PRN
  Administered 2015-08-26: 60 mg via INTRAVENOUS

## 2015-08-26 MED ORDER — OXYCODONE-ACETAMINOPHEN 5-325 MG PO TABS: 1.0000 | ORAL_TABLET | ORAL | Status: DC | PRN

## 2015-08-26 MED ORDER — HEPARIN SODIUM (PORCINE) 1000 UNIT/ML IJ SOLN
INTRAMUSCULAR | Status: AC
  Filled 2015-08-26: qty 1

## 2015-08-26 MED ORDER — ONDANSETRON HCL 4 MG/2ML IJ SOLN
INTRAMUSCULAR | Status: DC | PRN
  Administered 2015-08-26: 4 mg via INTRAVENOUS

## 2015-08-26 MED ORDER — EPHEDRINE SULFATE 50 MG/ML IJ SOLN
INTRAMUSCULAR | Status: AC
  Filled 2015-08-26: qty 1

## 2015-08-26 SURGICAL SUPPLY — 32 items
CANISTER SUCTION 2500CC (MISCELLANEOUS) ×2 IMPLANT
CLIP TI MEDIUM 6 (CLIP) ×4 IMPLANT
CLIP TI WIDE RED SMALL 6 (CLIP) ×4 IMPLANT
COVER PROBE W GEL 5X96 (DRAPES) IMPLANT
ELECT REM PT RETURN 9FT ADLT (ELECTROSURGICAL) ×2
ELECTRODE REM PT RTRN 9FT ADLT (ELECTROSURGICAL) ×1 IMPLANT
GLOVE BIOGEL PI IND STRL 6.5 (GLOVE) ×2 IMPLANT
GLOVE BIOGEL PI IND STRL 7.5 (GLOVE) ×3 IMPLANT
GLOVE BIOGEL PI INDICATOR 6.5 (GLOVE) ×2
GLOVE BIOGEL PI INDICATOR 7.5 (GLOVE) ×3
GLOVE ECLIPSE 6.5 STRL STRAW (GLOVE) ×2 IMPLANT
GLOVE ECLIPSE 7.0 STRL STRAW (GLOVE) ×2 IMPLANT
GLOVE SURG SS PI 7.0 STRL IVOR (GLOVE) ×2 IMPLANT
GLOVE SURG SS PI 7.5 STRL IVOR (GLOVE) ×2 IMPLANT
GOWN STRL REUS W/ TWL LRG LVL3 (GOWN DISPOSABLE) ×3 IMPLANT
GOWN STRL REUS W/ TWL XL LVL3 (GOWN DISPOSABLE) ×1 IMPLANT
GOWN STRL REUS W/TWL LRG LVL3 (GOWN DISPOSABLE) ×6
GOWN STRL REUS W/TWL XL LVL3 (GOWN DISPOSABLE) ×1
HEMOSTAT SNOW SURGICEL 2X4 (HEMOSTASIS) ×2 IMPLANT
KIT BASIN OR (CUSTOM PROCEDURE TRAY) ×2 IMPLANT
KIT ROOM TURNOVER OR (KITS) ×2 IMPLANT
LIQUID BAND (GAUZE/BANDAGES/DRESSINGS) ×2 IMPLANT
NS IRRIG 1000ML POUR BTL (IV SOLUTION) ×2 IMPLANT
PACK CV ACCESS (CUSTOM PROCEDURE TRAY) ×2 IMPLANT
PAD ARMBOARD 7.5X6 YLW CONV (MISCELLANEOUS) ×4 IMPLANT
SUT PROLENE 5 0 C 1 24 (SUTURE) ×2 IMPLANT
SUT PROLENE 6 0 CC (SUTURE) ×2 IMPLANT
SUT VIC AB 3-0 SH 27 (SUTURE) ×2
SUT VIC AB 3-0 SH 27X BRD (SUTURE) ×1 IMPLANT
SUT VICRYL 4-0 PS2 18IN ABS (SUTURE) ×2 IMPLANT
UNDERPAD 30X30 INCONTINENT (UNDERPADS AND DIAPERS) ×2 IMPLANT
WATER STERILE IRR 1000ML POUR (IV SOLUTION) ×2 IMPLANT

## 2015-08-26 NOTE — Interval H&P Note (Signed)
History and Physical Interval Note:  08/26/2015 7:23 AM  Cynthia [name]  has presented today for surgery, with the diagnosis of Aneurysm left arm arteiovenous fistula I72.9  The various methods of treatment have been discussed with the patient and family. After consideration of risks, benefits and other options for treatment, the patient has consented to  Procedure(s): RESECTION ANEURYSM OF LEFT ARM ARTERIOVENOUS FISTULA   (Left) as a surgical intervention .  The patient's history has been reviewed, patient examined, no change in status, stable for surgery.  I have reviewed the patient's chart and labs.  Questions were answered to the patient's satisfaction.     [name]

## 2015-08-26 NOTE — H&P (View-Only) (Signed)
Patient name: Cynthia [name] MRN: [id number] DOB: 1971-03-23 Sex: female     Chief Complaint  Patient presents with  . Re-evaluation    3 wk f/u s/p repair of L AVF aneurysm     HISTORY OF PRESENT ILLNESS:  patient is back for follow-up.  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 This was done as stage I of a 2 staged procedure.  She has another large aneurysm on her arm but I did not feel that I couldn't exclude both aneurysms without having to place a catheter.  She is back today without complaints  Past Medical History  Diagnosis Date  . Hypertension   . ESRD on hemodialysis ([place])     Home HD 5x per week  . Depression   . 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 GERD (gastroesophageal reflux disease)   . Neuromuscular disorder ([place])     NEUROPATHY  . History of blood transfusion     transfusion reaction  . Anemia   . Anxiety     Past Surgical History  Procedure Laterality Date  . Cholecystectomy    . Abdominal hysterectomy    . Breast surgery      biopsy bilateral  . Tubal ligation    . Dialysis fistula creation Left   . Eye surgery Bilateral     lazer  . Revison of arteriovenous fistula Left 09/28/2013    Procedure: EXCISE ESCHAR LEFT ARM  ARTERIOVENOUS FISTULA WITH PLICATION OF LEFT ARM ARTERIOVENOUS FISTULA;  Surgeon: Elam Dutch, MD;  Location: [place];  Service: Vascular;  Laterality: Left;  . Left heart catheterization with coronary angiogram N/A 03/30/2014    Procedure: LEFT HEART CATHETERIZATION WITH CORONARY ANGIOGRAM;  Surgeon: Sinclair Grooms, MD;  Location: [facility] CATH LAB;  Service: Cardiovascular;  Laterality: N/A;  . Resection of arteriovenous fistula aneurysm Left 07/07/2015    Procedure: REPAIR OF ARTERIOVENOUS FISTULA ANEURYSM;  Surgeon: Serafina Mitchell, MD;  Location: [facility] OR;  Service: Vascular;  Laterality: Left;    Social History   Social History  . Marital Status:  Legally Separated    Spouse Name: N/A  . Number of Children: 3  . Years of Education: 9   Occupational History  . disabled    Social History Main Topics  . Smoking status: Never Smoker   . Smokeless tobacco: Never Used  . Alcohol Use: No  . Drug Use: No  . Sexual Activity: Yes   Other Topics Concern  . Not on file   Social History Narrative   Lives in home with daughters, grand daughter, son-in-law   Caffeine use - 1 cup coffee sometimes     Family History  Problem Relation Age of Onset  . Cancer Maternal Grandmother   . Diabetes Maternal Grandfather   . Diabetes Paternal Grandmother   . Diabetes Mother   . Kidney disease Mother   . Diabetes Father   . Heart disease Father   . Kidney disease Father   . Diabetes Sister   . Asthma Sister   . Lupus Sister   . Diabetes Brother     Allergies as of 08/01/2015 - Review Complete 08/01/2015  Allergen Reaction Noted  . Tape Other (See Comments) 06/20/2015    Current Outpatient Prescriptions on File Prior to Visit  Medication Sig Dispense Refill  . aspirin EC 81 MG EC tablet Take 1 tablet (81 mg total) by mouth  daily. 90 tablet 3  . atorvastatin (LIPITOR) 10 MG tablet Take 10 mg by mouth daily.    . calcitRIOL (ROCALTROL) 0.5 MCG capsule Take 0.5 mcg by mouth daily.    . carvedilol (COREG) 12.5 MG tablet Take 25 mg by mouth 2 (two) times daily with a meal.     . cinacalcet (SENSIPAR) 60 MG tablet Take 60 mg by mouth daily.    . cloNIDine (CATAPRES) 0.1 MG tablet Take 2 tablets (0.2 mg total) by mouth 2 (two) times daily. 10 tablet 0  . docusate sodium (COLACE) 100 MG capsule Take 100 mg by mouth 2 (two) times daily as needed for mild constipation.     Marland Kitchen FLUoxetine (PROZAC) 20 MG tablet Take 20 mg by mouth daily.    . fluticasone (FLONASE) 50 MCG/ACT nasal spray Place 1 spray into the nose daily as needed.    . gabapentin (NEURONTIN) 300 MG capsule Take 300 mg by mouth at bedtime.  12  . Insulin Glargine (LANTUS SOLOSTAR)  100 UNIT/ML Solostar Pen Inject 10 Units into the skin at bedtime.    . metoCLOPramide (REGLAN) 5 MG tablet Take 5 mg by mouth daily.  3  . multivitamin (RENA-VIT) TABS tablet Take 1 tablet by mouth daily.    . ondansetron (ZOFRAN) 4 MG tablet Take 4 mg by mouth every 8 (eight) hours as needed for nausea or vomiting.    Marland Kitchen oxyCODONE-acetaminophen (ROXICET) 5-325 MG tablet Take 1 tablet by mouth every 6 (six) hours as needed. 20 tablet 0  . sevelamer (RENVELA) 800 MG tablet Take 2,400 mg by mouth 2 (two) times daily with a meal.     . [DISCONTINUED] labetalol (NORMODYNE) 100 MG tablet Take 1 tablet (100 mg total) by mouth 2 (two) times daily. (Patient not taking: Reported on 03/28/2015) 60 tablet 2  . [DISCONTINUED] lisinopril (PRINIVIL,ZESTRIL) 20 MG tablet Take 20 mg by mouth at bedtime.     No current facility-administered medications on file prior to visit.       PHYSICAL EXAMINATION:   Vital signs are  Filed Vitals:   08/01/15 1204  BP: 137/71  Pulse: 69  Temp: 97.7 F (36.5 C)  TempSrc: Oral  Height: 5\' 7"  (1.702 m)  Weight: 158 lb 12.8 oz (72.031 kg)  SpO2: 100%   Body mass index is 24.87 kg/(m^2). General: The patient appears their stated age.  there remains a great thrill within her left brachiocephalic fistula.  Her incisions are healing nicely.  There is a residual large aneurysm near the arteriovenous anastomosis  Diagnostic Studies  none  Assessment:  end-stage renal disease Plan:  the patient will be scheduled to have the remaining aneurysm addressed in the operating room in early March.  She is trying to coordinate with her schedule.  The fistula where I resected the aneurysm can be utilized in 2 weeks.  Eldridge Abrahams, M.D. [facility] [place] Office: [phone] Pager:  [phone]

## 2015-08-26 NOTE — Progress Notes (Signed)
Interpreter Lesle Chris for PACU

## 2015-08-26 NOTE — Anesthesia Postprocedure Evaluation (Signed)
Anesthesia Post Note  Patient: Cynthia [name]  Procedure(s) Performed: Procedure(s) (LRB): RESECTION ANEURYSM OF LEFT ARM ARTERIOVENOUS FISTULA   (Left)  Patient location during evaluation: PACU Anesthesia Type: General Level of consciousness: awake and alert Pain management: pain level controlled Vital Signs Assessment: post-procedure vital signs reviewed and stable Respiratory status: spontaneous breathing, nonlabored ventilation and respiratory function stable Cardiovascular status: blood pressure returned to baseline and stable Postop Assessment: no signs of nausea or vomiting Anesthetic complications: no    Last Vitals:  Filed Vitals:   08/26/15 1009 08/26/15 1026  BP:  103/49  Pulse:  73  Temp: 36.2 C   Resp:      Last Pain: There were no vitals filed for this visit.               Woodward Klem,W. EDMOND

## 2015-08-26 NOTE — Anesthesia Preprocedure Evaluation (Addendum)
Anesthesia Evaluation  Patient identified by MRN, date of birth, ID band Patient awake    Reviewed: Allergy & Precautions, H&P , NPO status , Patient's Chart, lab work & pertinent test results, reviewed documented beta blocker date and time   Airway Mallampati: II  TM Distance: >3 FB Neck ROM: Full    Dental no notable dental hx. (+) Teeth Intact, Dental Advisory Given   Pulmonary neg pulmonary ROS,    Pulmonary exam normal breath sounds clear to auscultation       Cardiovascular hypertension, Pt. on medications and Pt. on home beta blockers  Rhythm:Regular Rate:Normal     Neuro/Psych PSYCHIATRIC DISORDERS Anxiety Depression negative neurological ROS  negative psych ROS   GI/Hepatic Neg liver ROS, GERD  Medicated and Controlled,  Endo/Other  diabetes, Type 1, Insulin Dependent  Renal/GU ESRF and DialysisRenal disease  negative genitourinary   Musculoskeletal   Abdominal   Peds  Hematology negative hematology ROS (+) anemia ,   Anesthesia Other Findings   Reproductive/Obstetrics negative OB ROS                            Anesthesia Physical Anesthesia Plan  ASA: III  Anesthesia Plan: General   Post-op Pain Management:    Induction: Intravenous  Airway Management Planned: LMA  Additional Equipment:   Intra-op Plan:   Post-operative Plan: Extubation in OR  Informed Consent: I have reviewed the patients History and Physical, chart, labs and discussed the procedure including the risks, benefits and alternatives for the proposed anesthesia with the patient or authorized representative who has indicated his/her understanding and acceptance.   Dental advisory given  Plan Discussed with: CRNA  Anesthesia Plan Comments:         Anesthesia Quick Evaluation

## 2015-08-26 NOTE — Progress Notes (Signed)
Called Dr.Fitzgerald for sign out --okay to d/c home at this time

## 2015-08-26 NOTE — Transfer of Care (Signed)
Immediate Anesthesia Transfer of Care Note  Patient: Cynthia [name]  Procedure(s) Performed: Procedure(s): RESECTION ANEURYSM OF LEFT ARM ARTERIOVENOUS FISTULA   (Left)  Patient Location: PACU  Anesthesia Type:General  Level of Consciousness: awake, sedated and patient cooperative  Airway & Oxygen Therapy: Patient Spontanous Breathing and Patient connected to nasal cannula oxygen  Post-op Assessment: Report given to RN, Post -op Vital signs reviewed and stable and Patient moving all extremities  Post vital signs: Reviewed and stable  Last Vitals:  Filed Vitals:   08/26/15 0651  BP: 194/78  Pulse: 67  Temp: 36.6 C  Resp: 18    Complications: No apparent anesthesia complications

## 2015-08-26 NOTE — Op Note (Signed)
    Patient name: Cynthia [name] MRN: [id number] DOB: [birth year] Sex: female  08/26/2015 Pre-operative Diagnosis: End-stage renal disease, aneurysmal left brachiocephalic fistula Post-operative diagnosis:  Same Surgeon:  [name] Assistants:  Gerri Lins Procedure:   Resection with plication of left brachiocephalic fistula aneurysm Anesthesia:  Gen. Blood Loss:  See anesthesia record Specimens:  None  Findings:  Aneurysmal changes began just beyond the arterial venous anastomosis.  I resected approximate 75% aneurysms and perform primary closure  Indications:  The patient is previously undergone aneurysm resection of a more proximal portion of her fistula.  She comes in today for the aneurysmal changes at the anastomosis and proximal fistula.  Procedure:  The patient was identified in the holding area and taken to [place] 16  The patient was then placed supine on the table. general anesthesia was administered.  The patient was prepped and draped in the usual sterile fashion.  A time out was called and antibiotics were administered.  An incision was made anterior to the aneurysm.  Cautery was used to divide subcutaneous tissue.  Using Bovie cautery and sharp dissection the aneurysm was fully isolated.  I got proximal and distal control.  I did not have to dissect out the brachial artery as the aneurysmal fistula began about 1 cm beyond the anastomosis.  Once everything was exposed the patient was given 3000 units of heparin.  After this is circulated, the fistula was occluded proximally and distally.  A #11 blade was used to make a veinotomy.  The fistula was then opened anteriorly.  I resected approximate 75% of the wall.  I also performed a limited endarterectomy of the calcified plaque at the proximal fistula.  Once this was completed I closed the anterior wall primarily using a running 5-0 Prolene.  There was an excellent thrill within the fistula after the clamps were released.  25 mg  of protamine was given.  I then trimmed off a portion of the skin edges so there was no redundant skin.  The skin was then closed by reapproximating the subcutaneous tissue with 3-0 Vicryl the skin was 4-0 Vicryl followed by Dermabond.  There were no immediate complicationsition:  To PACU in stable condition.   Theotis Burrow, M.D. [facility] [place] Office: [phone] Pager:  [phone]

## 2015-08-26 NOTE — Anesthesia Procedure Notes (Signed)
Procedure Name: LMA Insertion Date/Time: 08/26/2015 7:40 AM Performed by: Izora Gala Pre-anesthesia Checklist: Patient identified, Emergency Drugs available, Suction available and Patient being monitored Patient Re-evaluated:Patient Re-evaluated prior to inductionOxygen Delivery Method: Circle system utilized Preoxygenation: Pre-oxygenation with 100% oxygen Intubation Type: IV induction Ventilation: Mask ventilation without difficulty LMA: LMA inserted LMA Size: 4.0 Number of attempts: 1 Placement Confirmation: positive ETCO2 Tube secured with: Tape Dental Injury: Teeth and Oropharynx as per pre-operative assessment

## 2015-08-26 NOTE — OR Nursing (Signed)
Pt had been discharged from PACU, in wheelchair heading for car when she stated that she felt funny, like she was going to pass out - returned to PACU, BP 70/39, HR 66, Blood glucse 113, pulse ox 98% on room air. Dr. Ola Spurr notified. Orders received.

## 2015-08-29 ENCOUNTER — Encounter (HOSPITAL_COMMUNITY): Payer: Self-pay | Admitting: Surgery

## 2015-09-02 ENCOUNTER — Other Ambulatory Visit: Payer: Self-pay | Admitting: Surgery

## 2015-09-02 DIAGNOSIS — R928 Other abnormal and inconclusive findings on diagnostic imaging of breast: Secondary | ICD-10-CM

## 2015-09-09 ENCOUNTER — Encounter (HOSPITAL_BASED_OUTPATIENT_CLINIC_OR_DEPARTMENT_OTHER): Payer: Self-pay | Admitting: *Deleted

## 2015-09-12 ENCOUNTER — Ambulatory Visit
Admission: RE | Admit: 2015-09-12 | Discharge: 2015-09-12 | Disposition: A | Discharge status: Home / Self Care | Payer: Medicare Other | Source: Ambulatory Visit | Attending: Surgery | Admitting: Surgery

## 2015-09-12 ENCOUNTER — Encounter: Payer: Self-pay | Admitting: Surgery

## 2015-09-12 DIAGNOSIS — R928 Other abnormal and inconclusive findings on diagnostic imaging of breast: Secondary | ICD-10-CM

## 2015-09-14 ENCOUNTER — Encounter (HOSPITAL_BASED_OUTPATIENT_CLINIC_OR_DEPARTMENT_OTHER): Payer: Self-pay | Admitting: Certified Registered"

## 2015-09-14 ENCOUNTER — Ambulatory Visit (HOSPITAL_BASED_OUTPATIENT_CLINIC_OR_DEPARTMENT_OTHER): Payer: Medicare Other | Admitting: Certified Registered"

## 2015-09-14 ENCOUNTER — Ambulatory Visit (HOSPITAL_BASED_OUTPATIENT_CLINIC_OR_DEPARTMENT_OTHER)
Admission: RE | Admit: 2015-09-14 | Discharge: 2015-09-14 | Disposition: A | Discharge status: Home / Self Care | Payer: Medicare Other | Source: Ambulatory Visit | Attending: Surgery | Admitting: Surgery

## 2015-09-14 ENCOUNTER — Encounter (HOSPITAL_BASED_OUTPATIENT_CLINIC_OR_DEPARTMENT_OTHER)
Admission: RE | Disposition: A | Discharge status: Home / Self Care | Payer: Self-pay | Source: Ambulatory Visit | Attending: Surgery

## 2015-09-14 ENCOUNTER — Ambulatory Visit
Admission: RE | Admit: 2015-09-14 | Discharge: 2015-09-14 | Disposition: A | Discharge status: Home / Self Care | Payer: Medicare Other | Source: Ambulatory Visit | Attending: Surgery | Admitting: Surgery

## 2015-09-14 DIAGNOSIS — K219 Gastro-esophageal reflux disease without esophagitis: Secondary | ICD-10-CM | POA: Diagnosis not present

## 2015-09-14 DIAGNOSIS — F329 Major depressive disorder, single episode, unspecified: Secondary | ICD-10-CM | POA: Insufficient documentation

## 2015-09-14 DIAGNOSIS — I120 Hypertensive chronic kidney disease with stage 5 chronic kidney disease or end stage renal disease: Secondary | ICD-10-CM | POA: Diagnosis not present

## 2015-09-14 DIAGNOSIS — N62 Hypertrophy of breast: Secondary | ICD-10-CM | POA: Diagnosis not present

## 2015-09-14 DIAGNOSIS — E1022 Type 1 diabetes mellitus with diabetic chronic kidney disease: Secondary | ICD-10-CM | POA: Insufficient documentation

## 2015-09-14 DIAGNOSIS — R928 Other abnormal and inconclusive findings on diagnostic imaging of breast: Secondary | ICD-10-CM | POA: Diagnosis present

## 2015-09-14 DIAGNOSIS — F419 Anxiety disorder, unspecified: Secondary | ICD-10-CM | POA: Insufficient documentation

## 2015-09-14 DIAGNOSIS — N186 End stage renal disease: Secondary | ICD-10-CM | POA: Insufficient documentation

## 2015-09-14 DIAGNOSIS — N6011 Diffuse cystic mastopathy of right breast: Secondary | ICD-10-CM | POA: Insufficient documentation

## 2015-09-14 DIAGNOSIS — Z79899 Other long term (current) drug therapy: Secondary | ICD-10-CM | POA: Insufficient documentation

## 2015-09-14 HISTORY — PX: BREAST LUMPECTOMY WITH RADIOACTIVE SEED LOCALIZATION: SHX6424

## 2015-09-14 LAB — POCT I-STAT, CHEM 8
BUN: 34 mg/dL — ABNORMAL HIGH (ref 6–20)
CREATININE: 5.2 mg/dL — AB (ref 0.44–1.00)
Calcium, Ion: 1.08 mmol/L — ABNORMAL LOW (ref 1.12–1.23)
Chloride: 96 mmol/L — ABNORMAL LOW (ref 101–111)
Glucose, Bld: 118 mg/dL — ABNORMAL HIGH (ref 65–99)
HEMATOCRIT: 32 % — AB (ref 36.0–46.0)
HEMOGLOBIN: 10.9 g/dL — AB (ref 12.0–15.0)
Potassium: 4.4 mmol/L (ref 3.5–5.1)
SODIUM: 135 mmol/L (ref 135–145)
TCO2: 26 mmol/L (ref 0–100)

## 2015-09-14 LAB — GLUCOSE, CAPILLARY: Glucose-Capillary: 122 mg/dL — ABNORMAL HIGH (ref 65–99)

## 2015-09-14 SURGERY — BREAST LUMPECTOMY WITH RADIOACTIVE SEED LOCALIZATION
Anesthesia: General | Site: Breast | Laterality: Right

## 2015-09-14 MED ORDER — DEXAMETHASONE SODIUM PHOSPHATE 4 MG/ML IJ SOLN
INTRAMUSCULAR | Status: DC | PRN
  Administered 2015-09-14: 10 mg via INTRAVENOUS

## 2015-09-14 MED ORDER — PHENYLEPHRINE HCL 10 MG/ML IJ SOLN
INTRAMUSCULAR | Status: DC | PRN
  Administered 2015-09-14: 80 ug via INTRAVENOUS
  Administered 2015-09-14: 40 ug via INTRAVENOUS
  Administered 2015-09-14: 80 ug via INTRAVENOUS

## 2015-09-14 MED ORDER — SCOPOLAMINE 1 MG/3DAYS TD PT72: 1.0000 | MEDICATED_PATCH | Freq: Once | TRANSDERMAL | Status: DC | PRN

## 2015-09-14 MED ORDER — MIDAZOLAM HCL 2 MG/2ML IJ SOLN
1.0000 mg | INTRAMUSCULAR | Status: DC | PRN
  Administered 2015-09-14: 1 mg via INTRAVENOUS

## 2015-09-14 MED ORDER — LIDOCAINE HCL (CARDIAC) 20 MG/ML IV SOLN
INTRAVENOUS | Status: AC
  Filled 2015-09-14: qty 5

## 2015-09-14 MED ORDER — GLYCOPYRROLATE 0.2 MG/ML IJ SOLN: 0.2000 mg | Freq: Once | INTRAMUSCULAR | Status: DC | PRN

## 2015-09-14 MED ORDER — PROPOFOL 10 MG/ML IV BOLUS
INTRAVENOUS | Status: DC | PRN
  Administered 2015-09-14: 200 mg via INTRAVENOUS

## 2015-09-14 MED ORDER — PHENYLEPHRINE HCL 10 MG/ML IJ SOLN
10.0000 mg | INTRAVENOUS | Status: DC | PRN
  Administered 2015-09-14: 50 ug/min via INTRAVENOUS

## 2015-09-14 MED ORDER — OXYCODONE-ACETAMINOPHEN 5-325 MG PO TABS: 1.0000 | ORAL_TABLET | ORAL | Status: DC | PRN

## 2015-09-14 MED ORDER — DEXAMETHASONE SODIUM PHOSPHATE 10 MG/ML IJ SOLN
INTRAMUSCULAR | Status: AC
  Filled 2015-09-14: qty 1

## 2015-09-14 MED ORDER — LACTATED RINGERS IV SOLN: INTRAVENOUS | Status: DC

## 2015-09-14 MED ORDER — CEFAZOLIN SODIUM-DEXTROSE 2-3 GM-% IV SOLR
INTRAVENOUS | Status: AC
Start: 2015-09-14 — End: 2015-09-14
  Filled 2015-09-14: qty 50

## 2015-09-14 MED ORDER — ONDANSETRON HCL 4 MG/2ML IJ SOLN
INTRAMUSCULAR | Status: AC
  Filled 2015-09-14: qty 2

## 2015-09-14 MED ORDER — MIDAZOLAM HCL 2 MG/2ML IJ SOLN
INTRAMUSCULAR | Status: AC
  Filled 2015-09-14: qty 2

## 2015-09-14 MED ORDER — BUPIVACAINE-EPINEPHRINE 0.25% -1:200000 IJ SOLN
INTRAMUSCULAR | Status: DC | PRN
  Administered 2015-09-14: 10 mL

## 2015-09-14 MED ORDER — LIDOCAINE HCL (CARDIAC) 20 MG/ML IV SOLN
INTRAVENOUS | Status: DC | PRN
  Administered 2015-09-14: 60 mg via INTRAVENOUS

## 2015-09-14 MED ORDER — FENTANYL CITRATE (PF) 100 MCG/2ML IJ SOLN
25.0000 ug | INTRAMUSCULAR | Status: DC | PRN
  Administered 2015-09-14 (×2): 25 ug via INTRAVENOUS

## 2015-09-14 MED ORDER — CEFAZOLIN SODIUM-DEXTROSE 2-3 GM-% IV SOLR
2.0000 g | INTRAVENOUS | Status: AC
  Administered 2015-09-14: 2 g via INTRAVENOUS

## 2015-09-14 MED ORDER — ONDANSETRON HCL 4 MG/2ML IJ SOLN: 4.0000 mg | Freq: Once | INTRAMUSCULAR | Status: DC | PRN

## 2015-09-14 MED ORDER — LABETALOL HCL 5 MG/ML IV SOLN: 5.0000 mg | Freq: Once | INTRAVENOUS | Status: DC

## 2015-09-14 MED ORDER — SODIUM CHLORIDE 0.9 % IV SOLN
INTRAVENOUS | Status: DC
Start: 2015-09-14 — End: 2015-09-14

## 2015-09-14 MED ORDER — SODIUM CHLORIDE 0.9 % IV SOLN
INTRAVENOUS | Status: DC | PRN
  Administered 2015-09-14: 10:00:00 via INTRAVENOUS

## 2015-09-14 MED ORDER — FENTANYL CITRATE (PF) 100 MCG/2ML IJ SOLN
50.0000 ug | INTRAMUSCULAR | Status: DC | PRN
  Administered 2015-09-14 (×2): 50 ug via INTRAVENOUS

## 2015-09-14 MED ORDER — OXYCODONE-ACETAMINOPHEN 5-325 MG PO TABS
1.0000 | ORAL_TABLET | ORAL | Status: DC | PRN
Start: 2015-09-14 — End: 2015-09-14
  Administered 2015-09-14: 1 via ORAL

## 2015-09-14 MED ORDER — 0.9 % SODIUM CHLORIDE (POUR BTL) OPTIME
TOPICAL | Status: DC | PRN
  Administered 2015-09-14: 100 mL

## 2015-09-14 MED ORDER — PHENYLEPHRINE HCL 10 MG/ML IJ SOLN
INTRAMUSCULAR | Status: AC
  Filled 2015-09-14: qty 1

## 2015-09-14 MED ORDER — OXYCODONE-ACETAMINOPHEN 5-325 MG PO TABS
ORAL_TABLET | ORAL | Status: AC
  Filled 2015-09-14: qty 1

## 2015-09-14 MED ORDER — FENTANYL CITRATE (PF) 100 MCG/2ML IJ SOLN: 50.0000 ug | INTRAMUSCULAR | Status: DC | PRN

## 2015-09-14 MED ORDER — MIDAZOLAM HCL 2 MG/2ML IJ SOLN: 1.0000 mg | INTRAMUSCULAR | Status: DC | PRN

## 2015-09-14 MED ORDER — PHENYLEPHRINE 40 MCG/ML (10ML) SYRINGE FOR IV PUSH (FOR BLOOD PRESSURE SUPPORT)
PREFILLED_SYRINGE | INTRAVENOUS | Status: AC
Start: 2015-09-14 — End: 2015-09-14
  Filled 2015-09-14: qty 10

## 2015-09-14 MED ORDER — ONDANSETRON HCL 4 MG/2ML IJ SOLN: 4.0000 mg | INTRAMUSCULAR | Status: DC | PRN

## 2015-09-14 MED ORDER — LACTATED RINGERS IV SOLN: 500.0000 mL | INTRAVENOUS | Status: DC

## 2015-09-14 MED ORDER — FENTANYL CITRATE (PF) 100 MCG/2ML IJ SOLN
INTRAMUSCULAR | Status: AC
  Filled 2015-09-14: qty 2

## 2015-09-14 MED ORDER — ONDANSETRON HCL 4 MG/2ML IJ SOLN
INTRAMUSCULAR | Status: DC | PRN
  Administered 2015-09-14: 4 mg via INTRAVENOUS

## 2015-09-14 MED ORDER — MORPHINE SULFATE (PF) 2 MG/ML IV SOLN: 2.0000 mg | INTRAVENOUS | Status: DC | PRN

## 2015-09-14 MED ORDER — SODIUM CHLORIDE 0.9 % IV SOLN
INTRAVENOUS | Status: DC
Start: 2015-09-14 — End: 2015-09-14
  Administered 2015-09-14: 10:00:00 via INTRAVENOUS

## 2015-09-14 MED ORDER — CHLORHEXIDINE GLUCONATE 4 % EX LIQD: 1.0000 "application " | Freq: Once | CUTANEOUS | Status: DC

## 2015-09-14 SURGICAL SUPPLY — 46 items
APPLIER CLIP 9.375 MED OPEN (MISCELLANEOUS) ×2
APR CLP MED 9.3 20 MLT OPN (MISCELLANEOUS) ×1
BENZOIN TINCTURE PRP APPL 2/3 (GAUZE/BANDAGES/DRESSINGS) ×2 IMPLANT
BLADE HEX COATED 2.75 (ELECTRODE) ×2 IMPLANT
BLADE SURG 15 STRL LF DISP TIS (BLADE) ×1 IMPLANT
BLADE SURG 15 STRL SS (BLADE) ×2
CANISTER SUC SOCK COL 7IN (MISCELLANEOUS) IMPLANT
CANISTER SUCT 1200ML W/VALVE (MISCELLANEOUS) ×2 IMPLANT
CHLORAPREP W/TINT 26ML (MISCELLANEOUS) ×2 IMPLANT
CLIP APPLIE 9.375 MED OPEN (MISCELLANEOUS) ×1 IMPLANT
COVER BACK TABLE 60X90IN (DRAPES) ×2 IMPLANT
COVER MAYO STAND STRL (DRAPES) ×2 IMPLANT
COVER PROBE W GEL 5X96 (DRAPES) ×2 IMPLANT
DEVICE DUBIN W/COMP PLATE 8390 (MISCELLANEOUS) ×2 IMPLANT
DRAPE LAPAROTOMY 100X72 PEDS (DRAPES) ×2 IMPLANT
DRAPE UTILITY XL STRL (DRAPES) ×2 IMPLANT
DRSG TEGADERM 4X4.75 (GAUZE/BANDAGES/DRESSINGS) ×2 IMPLANT
ELECT REM PT RETURN 9FT ADLT (ELECTROSURGICAL) ×2
ELECTRODE REM PT RTRN 9FT ADLT (ELECTROSURGICAL) ×1 IMPLANT
GLOVE BIO SURGEON STRL SZ7 (GLOVE) ×2 IMPLANT
GLOVE BIOGEL PI IND STRL 7.0 (GLOVE) ×1 IMPLANT
GLOVE BIOGEL PI IND STRL 7.5 (GLOVE) ×1 IMPLANT
GLOVE BIOGEL PI INDICATOR 7.0 (GLOVE) ×1
GLOVE BIOGEL PI INDICATOR 7.5 (GLOVE) ×1
GLOVE ECLIPSE 6.5 STRL STRAW (GLOVE) ×2 IMPLANT
GOWN STRL REUS W/ TWL LRG LVL3 (GOWN DISPOSABLE) ×2 IMPLANT
GOWN STRL REUS W/TWL LRG LVL3 (GOWN DISPOSABLE) ×4
KIT MARKER MARGIN INK (KITS) ×2 IMPLANT
NEEDLE HYPO 25X1 1.5 SAFETY (NEEDLE) ×2 IMPLANT
NS IRRIG 1000ML POUR BTL (IV SOLUTION) ×2 IMPLANT
PACK BASIN DAY SURGERY FS (CUSTOM PROCEDURE TRAY) ×2 IMPLANT
PENCIL BUTTON HOLSTER BLD 10FT (ELECTRODE) ×2 IMPLANT
SLEEVE SCD COMPRESS KNEE MED (MISCELLANEOUS) ×2 IMPLANT
SPONGE GAUZE 4X4 12PLY STER LF (GAUZE/BANDAGES/DRESSINGS) ×2 IMPLANT
SPONGE LAP 4X18 X RAY DECT (DISPOSABLE) ×2 IMPLANT
STRIP CLOSURE SKIN 1/2X4 (GAUZE/BANDAGES/DRESSINGS) ×2 IMPLANT
SUT MON AB 4-0 PC3 18 (SUTURE) ×2 IMPLANT
SUT SILK 2 0 SH (SUTURE) IMPLANT
SUT VIC AB 3-0 SH 27 (SUTURE) ×1
SUT VIC AB 3-0 SH 27X BRD (SUTURE) ×1 IMPLANT
SYR BULB 3OZ (MISCELLANEOUS) ×2 IMPLANT
SYR CONTROL 10ML LL (SYRINGE) ×2 IMPLANT
TOWEL OR 17X24 6PK STRL BLUE (TOWEL DISPOSABLE) ×2 IMPLANT
TOWEL OR NON WOVEN STRL DISP B (DISPOSABLE) ×2 IMPLANT
TUBE CONNECTING 20X1/4 (TUBING) IMPLANT
YANKAUER SUCT BULB TIP NO VENT (SUCTIONS) IMPLANT

## 2015-09-14 NOTE — H&P (View-Only) (Signed)
  History of Present Illness Cynthia [name]. Cynthia Joffe MD; 08/23/2015 4:42 PM) Patient words: breast.  The patient is a 45 year old female who presents with a complaint of Breast problems. This is a 45 yo female who is s/p excision of an infected sebaceous cyst of the posterior scalp by Dr. Redmond Pulling. She recently underwent a routine screening mammogram on 07/15/15 that showed a possible mass in the right breast. On 07/20/15 she underwent diagnostic mammogram and ultrasound which showed an area of persistent distortion at 9:30 in the right breast 5 cm from the nipple measuring 1.5 cm in size. She had a previous lumpectomy near this area. However ultrasound confirmed that this was in an area different than the lumpectomy. She underwent core biopsy of this area with placement of a clip. Pathology showed breast parenchyma with fibrocystic changes and focal usual ductal hyperplasia. However the findings were felt to be discordant with the mammogram. She is now referred for lumpectomy.  The patient is on hemodialysis 5 days a week at home. She is on the transplant list at [facility].  Previous lumpectomy in 2007 for palpable mass - findings benign  Family history - negative Menarche - 34 First pregnancy - 17 Breast feeding - yes Hormones - 6 years   Allergies (Ammie Eversole, LPN; [id number] 075-GRM PM) No Known Drug Allergies 06/08/2015  Medication History (Ammie Eversole, LPN; D34-534 075-GRM PM) Doxycycline Hyclate (100MG  Capsule, 1 (one) Oral two times daily, Taken starting 07/20/2015) Active. Sensipar (60MG  Tablet, Oral) Active. CloNIDine HCl (0.1MG  Tablet, Oral) Active. DocQLace (100MG  Capsule, Oral) Active. Metoclopramide HCl (5MG  Tablet, Oral) Active. Rena-Vite (Oral) Active. Carvedilol (12.5MG  Tablet, Oral) Active. Ondansetron HCl (4MG  Tablet, Oral) Active. BD Pen Needle Nano U/F (32G X 4 MM Misc,) Active. Fluticasone Propionate (50MCG/ACT Suspension, Nasal)  Active. Gabapentin (300MG  Capsule, Oral) Active. Renvela (800MG  Tablet, Oral) Active. Medications Reconciled    Vitals (Ammie Eversole LPN; D34-534 075-GRM PM) 08/23/2015 4:10 PM Weight: 161.2 lb Height: 61in Body Surface Area: 1.72 m Body Mass Index: 30.46 kg/m  Pulse: 82 (Regular)  BP: 152/80 (Sitting, Left Arm, Standard)      Physical Exam Rodman Key K. Audrick Lamoureaux MD; 08/23/2015 4:41 PM)  The physical exam findings are as follows: Note:WDWN in NAD HEENT: EOMI, sclera anicteric Neck: No masses, posterior scalp - healed surgical site with no sign of infection Lungs: CTA bilaterally; normal respiratory effort R breast - healed lumpectomy incision - slight bruising from biopsy No palpable masses; no axillary lymphadenopathy; no nipple retraction or discharge Left breast - no axillary lymphadenopathy; no palpable masses; no nipple retraction or discharge CV: Regular rate and rhythm; no murmurs Abd: +bowel sounds, soft, non-tender, no masses Ext: Well-perfused; no edema Skin: Warm, dry; no sign of jaundice    Assessment & Plan Rodman Key K. Tavie Haseman MD; 08/23/2015 4:37 PM)  ABNORMALITY OF RIGHT BREAST ON SCREENING MAMMOGRAM (R92.8)  Current Plans Schedule for Surgery - Right radioactive seed localized lumpectomy. The surgical procedure has been discussed with the patient. Potential risks, benefits, alternative treatments, and expected outcomes have been explained. All of the patient's questions at this time have been answered. The likelihood of reaching the patient's treatment goal is good. The patient understand the proposed surgical procedure and wishes to proceed. Pt Education - [facility] Breast Biopsy HCI: discussed with patient and provided information.  Cynthia [name]. Georgette Dover, MD, [facility]  General/ Trauma Surgery  08/23/2015 4:43 PM

## 2015-09-14 NOTE — Anesthesia Postprocedure Evaluation (Signed)
Anesthesia Post Note  Patient: Cynthia [name]  Procedure(s) Performed: Procedure(s) (LRB): RIGHT BREAST LUMPECTOMY WITH RADIOACTIVE SEED LOCALIZATION (Right)  Patient location during evaluation: PACU Anesthesia Type: General Level of consciousness: awake and alert Pain management: pain level controlled Vital Signs Assessment: post-procedure vital signs reviewed and stable Respiratory status: spontaneous breathing, nonlabored ventilation, respiratory function stable and patient connected to nasal cannula oxygen Cardiovascular status: blood pressure returned to baseline and stable Postop Assessment: no signs of nausea or vomiting Anesthetic complications: no    Last Vitals:  Filed Vitals:   09/14/15 1215 09/14/15 1230  BP: 166/67 165/73  Pulse: 69 69  Temp:    Resp: 15 12    Last Pain:  Filed Vitals:   09/14/15 1236  PainSc: 1                  [name]

## 2015-09-14 NOTE — Discharge Instructions (Signed)
[facility] °Office Phone Number [phone] ° °BREAST BIOPSY/ PARTIAL MASTECTOMY: POST OP INSTRUCTIONS ° °Always review your discharge instruction sheet given to you by the facility where your surgery was performed. ° °IF YOU HAVE DISABILITY OR FAMILY LEAVE FORMS, YOU MUST BRING THEM TO THE OFFICE FOR PROCESSING.  DO NOT GIVE THEM TO YOUR DOCTOR. ° °1. A prescription for pain medication may be given to you upon discharge.  Take your pain medication as prescribed, if needed.  If narcotic pain medicine is not needed, then you may take acetaminophen (Tylenol) or ibuprofen (Advil) as needed. °2. Take your usually prescribed medications unless otherwise directed °3. If you need a refill on your pain medication, please contact your pharmacy.  They will contact our office to request authorization.  Prescriptions will not be filled after 5pm or on week-ends. °4. You should eat very light the first 24 hours after surgery, such as soup, crackers, pudding, etc.  Resume your normal diet the day after surgery. °5. Most patients will experience some swelling and bruising in the breast.  Ice packs and a good support bra will help.  Swelling and bruising can take several days to resolve.  °6. It is common to experience some constipation if taking pain medication after surgery.  Increasing fluid intake and taking a stool softener will usually help or prevent this problem from occurring.  A mild laxative (Milk of Magnesia or Miralax) should be taken according to package directions if there are no bowel movements after 48 hours. °7. Unless discharge instructions indicate otherwise, you may remove your bandages 24-48 hours after surgery, and you may shower at that time.  You may have steri-strips (small skin tapes) in place directly over the incision.  These strips should be left on the skin for 7-10 days.  If your surgeon used skin glue on the incision, you may shower in 24 hours.  The glue will flake off over the  next 2-3 weeks.  Any sutures or staples will be removed at the office during your follow-up visit. °8. ACTIVITIES:  You may resume regular daily activities (gradually increasing) beginning the next day.  Wearing a good support bra or sports bra minimizes pain and swelling.  You may have sexual intercourse when it is comfortable. °a. You may drive when you no longer are taking prescription pain medication, you can comfortably wear a seatbelt, and you can safely maneuver your car and apply brakes. °b. RETURN TO WORK:  ______________________________________________________________________________________ °9. You should see your doctor in the office for a follow-up appointment approximately two weeks after your surgery.  Your doctor’s nurse will typically make your follow-up appointment when she calls you with your pathology report.  Expect your pathology report 2-3 business days after your surgery.  You may call to check if you do not hear from us after three days. °10. OTHER INSTRUCTIONS: _______________________________________________________________________________________________ _____________________________________________________________________________________________________________________________________ °_____________________________________________________________________________________________________________________________________ °_____________________________________________________________________________________________________________________________________ ° °WHEN TO CALL YOUR DOCTOR: °1. Fever over 101.0 °2. Nausea and/or vomiting. °3. Extreme swelling or bruising. °4. Continued bleeding from incision. °5. Increased pain, redness, or drainage from the incision. ° °The clinic staff is available to answer your questions during regular business hours.  Please don’t hesitate to call and ask to speak to one of the nurses for clinical concerns.  If you have a medical emergency, go to the nearest  emergency room or call 911.  A surgeon from [facility] is always on call at the hospital. ° °For further questions, please visit [email or web address]  ° ° ° °  Post Anesthesia Home Care Instructions ° °Activity: °Get plenty of rest for the remainder of the day. A responsible adult should stay with you for 24 hours following the procedure.  °For the next 24 hours, DO NOT: °-Drive a car °-Operate machinery °-Drink alcoholic beverages °-Take any medication unless instructed by your physician °-Make any legal decisions or sign important papers. ° °Meals: °Start with liquid foods such as gelatin or soup. Progress to regular foods as tolerated. Avoid greasy, spicy, heavy foods. If nausea and/or vomiting occur, drink only clear liquids until the nausea and/or vomiting subsides. Call your physician if vomiting continues. ° °Special Instructions/Symptoms: °Your throat may feel dry or sore from the anesthesia or the breathing tube placed in your throat during surgery. If this causes discomfort, gargle with warm salt water. The discomfort should disappear within 24 hours. ° °If you had a scopolamine patch placed behind your ear for the management of post- operative nausea and/or vomiting: ° °1. The medication in the patch is effective for 72 hours, after which it should be removed.  Wrap patch in a tissue and discard in the trash. Wash hands thoroughly with soap and water. °2. You may remove the patch earlier than 72 hours if you experience unpleasant side effects which may include dry mouth, dizziness or visual disturbances. °3. Avoid touching the patch. Wash your hands with soap and water after contact with the patch. °  ° °

## 2015-09-14 NOTE — Op Note (Signed)
Preop diagnosis: Distortion on mammogram right breast Postop diagnosis: Same Procedure performed: Right radioactive seed localized lumpectomy Surgeon:Alessandro Griep K. Anesthesia: Gen. via LMA Indications: This is a 45 year old female who recently underwent a routine screening mammogram that showed an area of architectural distortion around 9:30 the right breast 5 cm from the nipple. This area measured about 1.5 cm in size. She has had a previous lobectomy in this area. Core biopsy with placement of clip showed fibrocystic changes but these findings were felt to be discordant with the mammogram. She underwent seed placement 2 days ago. The seed was placed at the suspicious site on the mammogram which is about 2 cm lateral to the biopsy clip.  Description of procedure: The patient is brought to the operating room and placed in the supine position on the operating room table. The presence of the seed had previously been confirmed using the neoprobe. After an adequate level of general anesthesia was obtained, her right breast was prepped with ChloraPrep and draped sterile fashion. A timeout was taken to ensure the proper patient and proper procedure. We located the area of greatest activity and walk this with a skin marker. We outlined an incision that extended medially in a transverse fashion. We dissected down through some scar tissue from the previous lumpectomy into the breast tissue. We raised margins around the seed using the neoprobe for guidance. We made our medial margin more generous to try to take out the biopsy clip as well. Once we had raised 4 margins we grasped the specimen lifted. We made sure we were deep enough using the neoprobe. We then excised the entire specimen. This was oriented with a paint kit. There is no background residual activity within the breast. Specimen mammogram confirmed that the seed was in the center of the specimen and the biopsy clip is in the medial portion of the  specimen. This was confirmed by radiology. This was sent for pathologic examination. We then inspected the wound and irrigated. We used cautery for hemostasis. The wound was closed with a deep layer of 3-0 Vicryl and a subcuticular layer of 4-0 Monocryl. Steri-Strips and clean dressing were applied. The patient was then extubated and brought to recovery room in stable condition. All sponge, instrument, and needle counts are correct.  Cynthia [name]. Cynthia Dover, MD, [facility]  General/ Trauma Surgery  09/14/2015 11:16 AM

## 2015-09-14 NOTE — Transfer of Care (Signed)
Immediate Anesthesia Transfer of Care Note  Patient: Cynthia [name]  Procedure(s) Performed: Procedure(s): RIGHT BREAST LUMPECTOMY WITH RADIOACTIVE SEED LOCALIZATION (Right)  Patient Location: PACU  Anesthesia Type:General  Level of Consciousness: awake, alert  and oriented  Airway & Oxygen Therapy: Patient Spontanous Breathing and Patient connected to face mask oxygen  Post-op Assessment: Report given to RN, Post -op Vital signs reviewed and stable and Patient moving all extremities  Post vital signs: Reviewed and stable  Last Vitals:  Filed Vitals:   09/14/15 0908  BP: 185/60  Pulse: 66  Temp: 36.7 C  Resp: 16    Complications: No apparent anesthesia complications

## 2015-09-14 NOTE — Anesthesia Procedure Notes (Signed)
Procedure Name: LMA Insertion Date/Time: 09/14/2015 10:19 AM Performed by: Baxter Flattery Pre-anesthesia Checklist: Patient identified, Emergency Drugs available, Suction available and Patient being monitored Patient Re-evaluated:Patient Re-evaluated prior to inductionOxygen Delivery Method: Circle System Utilized Preoxygenation: Pre-oxygenation with 100% oxygen Intubation Type: IV induction Ventilation: Mask ventilation without difficulty LMA: LMA inserted LMA Size: 4.0 Number of attempts: 1 Airway Equipment and Method: Bite block Placement Confirmation: positive ETCO2 and breath sounds checked- equal and bilateral Tube secured with: Tape Dental Injury: Teeth and Oropharynx as per pre-operative assessment

## 2015-09-14 NOTE — Interval H&P Note (Signed)
History and Physical Interval Note:  09/14/2015 7:44 AM  Cynthia [name]  has presented today for surgery, with the diagnosis of Abnormal right mammogram  The various methods of treatment have been discussed with the patient and family. After consideration of risks, benefits and other options for treatment, the patient has consented to  Procedure(s): RIGHT BREAST LUMPECTOMY WITH RADIOACTIVE SEED LOCALIZATION (Right) as a surgical intervention .  The patient's history has been reviewed, patient examined, no change in status, stable for surgery.  I have reviewed the patient's chart and labs.  Questions were answered to the patient's satisfaction.     Lisel Siegrist K.

## 2015-09-14 NOTE — Anesthesia Preprocedure Evaluation (Addendum)
Anesthesia Evaluation  Patient identified by MRN, date of birth, ID band Patient awake    Reviewed: Allergy & Precautions, H&P , NPO status , Patient's Chart, lab work & pertinent test results, reviewed documented beta blocker date and time   Airway Mallampati: I  TM Distance: >3 FB Neck ROM: Full    Dental no notable dental hx. (+) Teeth Intact, Dental Advisory Given   Pulmonary neg pulmonary ROS,    Pulmonary exam normal breath sounds clear to auscultation       Cardiovascular hypertension, Pt. on medications and Pt. on home beta blockers  Rhythm:Regular Rate:Normal     Neuro/Psych PSYCHIATRIC DISORDERS Anxiety Depression negative neurological ROS  negative psych ROS   GI/Hepatic Neg liver ROS, GERD  Medicated and Controlled,  Endo/Other  diabetes, Type 1, Insulin Dependent  Renal/GU ESRF and DialysisRenal disease  negative genitourinary   Musculoskeletal   Abdominal   Peds  Hematology negative hematology ROS (+) anemia ,   Anesthesia Other Findings   Reproductive/Obstetrics negative OB ROS                            Anesthesia Physical  Anesthesia Plan  ASA: III  Anesthesia Plan: General   Post-op Pain Management:    Induction: Intravenous  Airway Management Planned: LMA  Additional Equipment:   Intra-op Plan:   Post-operative Plan: Extubation in OR  Informed Consent: I have reviewed the patients History and Physical, chart, labs and discussed the procedure including the risks, benefits and alternatives for the proposed anesthesia with the patient or authorized representative who has indicated his/her understanding and acceptance.   Dental advisory given  Plan Discussed with: CRNA  Anesthesia Plan Comments: (Dialysis yesterday, no previous problems with anesthesia, NPO appropriate with well treated GERD, will avoid left arm given fistula, electrolytes reviewed and within  normal limits)       Anesthesia Quick Evaluation

## 2015-09-15 ENCOUNTER — Encounter (HOSPITAL_BASED_OUTPATIENT_CLINIC_OR_DEPARTMENT_OTHER): Payer: Self-pay | Admitting: Surgery

## 2015-09-16 ENCOUNTER — Ambulatory Visit (INDEPENDENT_AMBULATORY_CARE_PROVIDER_SITE_OTHER): Payer: No Typology Code available for payment source | Admitting: Surgery

## 2015-09-16 VITALS — BP 146/72 | HR 70 | Temp 97.8°F | Resp 14 | Ht 61.0 in | Wt 157.0 lb

## 2015-09-16 DIAGNOSIS — T82510D Breakdown (mechanical) of surgically created arteriovenous fistula, subsequent encounter: Secondary | ICD-10-CM

## 2015-09-16 DIAGNOSIS — T82898D Other specified complication of vascular prosthetic devices, implants and grafts, subsequent encounter: Secondary | ICD-10-CM

## 2015-09-16 NOTE — Progress Notes (Signed)
Filed Vitals:   09/16/15 0923 09/16/15 0925  BP: 146/72 146/72  Pulse: 70 70  Temp: 97.8 F (36.6 C)   Resp: 14   Height: 5\' 1"  (1.549 m)   Weight: 157 lb (71.215 kg)   SpO2: 100%

## 2015-09-16 NOTE — Progress Notes (Signed)
Patient name: Cynthia [name] MRN: [id number] DOB: 12-Feb-1971 Sex: female     Chief Complaint  Patient presents with  . Re-evaluation    f/u  c/o left forearm is numb and pulling feeling    HISTORY OF PRESENT ILLNESS: The patient is back for follow-up.  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  She complains of some numbness in her forearm and a tugging feeling at her wrist and occasional pain.  She also complains of pain at the cannulation site in her upper arm when she is on dialysis.  Past Medical History  Diagnosis Date  . Hypertension   . Depression   . GERD (gastroesophageal reflux disease)   . Neuromuscular disorder ([place])     NEUROPATHY  . History of blood transfusion     transfusion reaction  . Anemia   . Anxiety   . ESRD on hemodialysis ([place])     Home HD 5x per week  . Insulin-dependent diabetes mellitus with renal complications (HCC)     Type I    Past Surgical History  Procedure Laterality Date  . Cholecystectomy    . Abdominal hysterectomy    . Breast surgery Bilateral     biopsy bilateral  . Tubal ligation    . Dialysis fistula creation Left   . Revison of arteriovenous fistula Left 09/28/2013    Procedure: EXCISE ESCHAR LEFT ARM  ARTERIOVENOUS FISTULA WITH PLICATION OF LEFT ARM ARTERIOVENOUS FISTULA;  Surgeon: Elam Dutch, MD;  Location: [facility];  Service: Vascular;  Laterality: Left;  . Left heart catheterization with coronary angiogram N/A 03/30/2014    Procedure: LEFT HEART CATHETERIZATION WITH CORONARY ANGIOGRAM;  Surgeon: Sinclair Grooms, MD;  Location: [facility] CATH LAB;  Service: Cardiovascular;  Laterality: N/A;  . Resection of arteriovenous fistula aneurysm Left 07/07/2015    Procedure: REPAIR OF ARTERIOVENOUS FISTULA ANEURYSM;  Surgeon: Serafina Mitchell, MD;  Location: [place];  Service: Vascular;  Laterality: Left;  Marland Kitchen Eye surgery Bilateral     lazer  .  Cataract extraction Bilateral     bilateral  . Revison of arteriovenous fistula Left 08/26/2015    Procedure: RESECTION ANEURYSM OF LEFT ARM ARTERIOVENOUS FISTULA  ;  Surgeon: Serafina Mitchell, MD;  Location: [facility];  Service: Vascular;  Laterality: Left;  . Breast lumpectomy with radioactive seed localization Right 09/14/2015    Procedure: RIGHT BREAST LUMPECTOMY WITH RADIOACTIVE SEED LOCALIZATION;  Surgeon: Donnie Mesa, MD;  Location: [place];  Service: General;  Laterality: Right;    Social History   Social History  . Marital Status: Legally Separated    Spouse Name: N/A  . Number of Children: 3  . Years of Education: 9   Occupational History  . disabled    Social History Main Topics  . Smoking status: Never Smoker   . Smokeless tobacco: Never Used  . Alcohol Use: No  . Drug Use: No  . Sexual Activity: Yes   Other Topics Concern  . Not on file   Social History Narrative   Lives in home with daughters, grand daughter, son-in-law   Caffeine use - 1 cup coffee sometimes     Family History  Problem Relation Age of Onset  . Cancer Maternal Grandmother   . Diabetes Maternal Grandfather   . Diabetes Paternal Grandmother   . Diabetes Mother   . Kidney disease Mother   .  Diabetes Father   . Heart disease Father   . Kidney disease Father   . Diabetes Sister   . Asthma Sister   . Lupus Sister   . Diabetes Brother     Allergies as of 09/16/2015 - Review Complete 09/16/2015  Allergen Reaction Noted  . Tape Other (See Comments) 06/20/2015    Current Outpatient Prescriptions on File Prior to Visit  Medication Sig Dispense Refill  . aspirin EC 81 MG EC tablet Take 1 tablet (81 mg total) by mouth daily. 90 tablet 3  . atorvastatin (LIPITOR) 10 MG tablet Take 10 mg by mouth daily.    . carvedilol (COREG) 25 MG tablet Take 25 mg by mouth 2 (two) times daily.  3  . cinacalcet (SENSIPAR) 60 MG tablet Take 60 mg by mouth daily.    . cloNIDine (CATAPRES) 0.1 MG  tablet Take 2 tablets (0.2 mg total) by mouth 2 (two) times daily. (Patient taking differently: Take 0.2 mg by mouth daily as needed (high blood pressure). ) 10 tablet 0  . docusate sodium (COLACE) 100 MG capsule Take 100 mg by mouth 2 (two) times daily as needed for mild constipation.     Marland Kitchen FLUoxetine (PROZAC) 20 MG tablet Take 20 mg by mouth daily.    Marland Kitchen gabapentin (NEURONTIN) 300 MG capsule Take 300 mg by mouth at bedtime.  12  . Insulin Glargine (LANTUS SOLOSTAR) 100 UNIT/ML Solostar Pen Inject 10 Units into the skin at bedtime.    Marland Kitchen ketorolac (ACULAR) 0.5 % ophthalmic solution Place 1 drop into the left eye 4 (four) times daily.  0  . multivitamin (RENA-VIT) TABS tablet Take 1 tablet by mouth daily.    . ondansetron (ZOFRAN) 4 MG tablet Take 4 mg by mouth every 8 (eight) hours as needed for nausea or vomiting.    Marland Kitchen oxyCODONE-acetaminophen (PERCOCET/ROXICET) 5-325 MG tablet Take 1 tablet by mouth every 4 (four) hours as needed for severe pain. 20 tablet 0  . oxyCODONE-acetaminophen (ROXICET) 5-325 MG tablet Take 1 tablet by mouth every 4 (four) hours as needed. 20 tablet 0  . pantoprazole (PROTONIX) 40 MG tablet Take 40 mg by mouth daily.  5  . sevelamer (RENVELA) 800 MG tablet Take 2,400 mg by mouth 2 (two) times daily with a meal.     . DUREZOL 0.05 % EMUL Place 1 drop into the right eye 3 (three) times daily. Reported on 09/16/2015  1  . VIGAMOX 0.5 % ophthalmic solution Place 1 drop into the right eye 4 (four) times daily. Reported on 09/16/2015  1  . [DISCONTINUED] labetalol (NORMODYNE) 100 MG tablet Take 1 tablet (100 mg total) by mouth 2 (two) times daily. (Patient not taking: Reported on 03/28/2015) 60 tablet 2  . [DISCONTINUED] lisinopril (PRINIVIL,ZESTRIL) 20 MG tablet Take 20 mg by mouth at bedtime.     No current facility-administered medications on file prior to visit.      PHYSICAL EXAMINATION:   Vital signs are  Filed Vitals:   09/16/15 0923 09/16/15 0925  BP: 146/72 146/72    Pulse: 70 70  Temp: 97.8 F (36.6 C)   Resp: 14   Height: 5\' 1"  (1.549 m)   Weight: 157 lb (71.215 kg)   SpO2: 100%    Body mass index is 29.68 kg/(m^2). General: The patient appears their stated age. An excellent thrill remains in her fistula.  All incisions have healed.  She has a palpable radial pulse   Assessment: Status post fistula aneurysm resection  Plan: The patient has complaints of numbness in her forearm.  I suspect this is related to a peripheral neuropathy, secondary to her operation.  I suspect this will improve with time.  I did tell her that it may not resolve completely.  She is only 3 weeks out from her operation which was fairly extensive given the size of the aneurysm at the anastomosis.  No definitive nerve was identified or transected.  I'm optimistic this will at least improved.  The patient's fistula can be cannulated near the anastomosis, one month following her operation which would be early April.  She will contact me in several months if her symptoms have not improved.  Eldridge Abrahams, M.D. [facility] Office: [phone] Pager:  [phone]

## 2015-11-17 ENCOUNTER — Encounter (HOSPITAL_COMMUNITY): Payer: Self-pay | Admitting: Adult Health

## 2015-11-17 ENCOUNTER — Inpatient Hospital Stay (HOSPITAL_COMMUNITY)
Admission: EM | Admit: 2015-11-17 | Discharge: 2015-11-20 | DRG: 640 | Disposition: A | Discharge status: Home / Self Care | Payer: Medicare Other | Attending: Internal Medicine | Admitting: Internal Medicine

## 2015-11-17 DIAGNOSIS — E875 Hyperkalemia: Principal | ICD-10-CM | POA: Diagnosis present

## 2015-11-17 DIAGNOSIS — Z940 Kidney transplant status: Secondary | ICD-10-CM

## 2015-11-17 DIAGNOSIS — Z9841 Cataract extraction status, right eye: Secondary | ICD-10-CM

## 2015-11-17 DIAGNOSIS — F419 Anxiety disorder, unspecified: Secondary | ICD-10-CM | POA: Diagnosis present

## 2015-11-17 DIAGNOSIS — N184 Chronic kidney disease, stage 4 (severe): Secondary | ICD-10-CM | POA: Diagnosis present

## 2015-11-17 DIAGNOSIS — N170 Acute kidney failure with tubular necrosis: Secondary | ICD-10-CM | POA: Diagnosis present

## 2015-11-17 DIAGNOSIS — Z79899 Other long term (current) drug therapy: Secondary | ICD-10-CM

## 2015-11-17 DIAGNOSIS — D631 Anemia in chronic kidney disease: Secondary | ICD-10-CM | POA: Diagnosis present

## 2015-11-17 DIAGNOSIS — E1140 Type 2 diabetes mellitus with diabetic neuropathy, unspecified: Secondary | ICD-10-CM | POA: Diagnosis present

## 2015-11-17 DIAGNOSIS — I120 Hypertensive chronic kidney disease with stage 5 chronic kidney disease or end stage renal disease: Secondary | ICD-10-CM | POA: Diagnosis present

## 2015-11-17 DIAGNOSIS — Z794 Long term (current) use of insulin: Secondary | ICD-10-CM

## 2015-11-17 DIAGNOSIS — D649 Anemia, unspecified: Secondary | ICD-10-CM | POA: Diagnosis present

## 2015-11-17 DIAGNOSIS — E109 Type 1 diabetes mellitus without complications: Secondary | ICD-10-CM

## 2015-11-17 DIAGNOSIS — Z7982 Long term (current) use of aspirin: Secondary | ICD-10-CM

## 2015-11-17 DIAGNOSIS — K219 Gastro-esophageal reflux disease without esophagitis: Secondary | ICD-10-CM | POA: Diagnosis present

## 2015-11-17 DIAGNOSIS — F329 Major depressive disorder, single episode, unspecified: Secondary | ICD-10-CM | POA: Diagnosis present

## 2015-11-17 DIAGNOSIS — E872 Acidosis: Secondary | ICD-10-CM | POA: Diagnosis present

## 2015-11-17 DIAGNOSIS — E119 Type 2 diabetes mellitus without complications: Secondary | ICD-10-CM

## 2015-11-17 DIAGNOSIS — E1122 Type 2 diabetes mellitus with diabetic chronic kidney disease: Secondary | ICD-10-CM | POA: Diagnosis present

## 2015-11-17 DIAGNOSIS — Z9842 Cataract extraction status, left eye: Secondary | ICD-10-CM

## 2015-11-17 DIAGNOSIS — Z992 Dependence on renal dialysis: Secondary | ICD-10-CM

## 2015-11-17 DIAGNOSIS — N189 Chronic kidney disease, unspecified: Secondary | ICD-10-CM

## 2015-11-17 DIAGNOSIS — E1129 Type 2 diabetes mellitus with other diabetic kidney complication: Secondary | ICD-10-CM

## 2015-11-17 HISTORY — DX: Kidney transplant status: Z94.0

## 2015-11-17 LAB — CBC WITH DIFFERENTIAL/PLATELET
BASOS ABS: 0 10*3/uL (ref 0.0–0.1)
Basophils Relative: 0 %
Eosinophils Absolute: 0 10*3/uL (ref 0.0–0.7)
Eosinophils Relative: 1 %
HEMATOCRIT: 29.8 % — AB (ref 36.0–46.0)
HEMOGLOBIN: 9.3 g/dL — AB (ref 12.0–15.0)
LYMPHS PCT: 13 %
Lymphs Abs: 0.9 10*3/uL (ref 0.7–4.0)
MCH: 31.6 pg (ref 26.0–34.0)
MCHC: 31.2 g/dL (ref 30.0–36.0)
MCV: 101.4 fL — AB (ref 78.0–100.0)
Monocytes Absolute: 0.4 10*3/uL (ref 0.1–1.0)
Monocytes Relative: 6 %
NEUTROS ABS: 5.6 10*3/uL (ref 1.7–7.7)
NEUTROS PCT: 80 %
Platelets: 203 10*3/uL (ref 150–400)
RBC: 2.94 MIL/uL — AB (ref 3.87–5.11)
RDW: 19.2 % — ABNORMAL HIGH (ref 11.5–15.5)
WBC: 6.9 10*3/uL (ref 4.0–10.5)

## 2015-11-17 NOTE — ED Notes (Signed)
Kidney transplant patient-transplanted last month called by physician to come to ER due to hyperkalemia-endorses fatigue. Denies pain.

## 2015-11-18 ENCOUNTER — Encounter (HOSPITAL_COMMUNITY): Payer: Self-pay | Admitting: Family Medicine

## 2015-11-18 DIAGNOSIS — E1129 Type 2 diabetes mellitus with other diabetic kidney complication: Secondary | ICD-10-CM

## 2015-11-18 DIAGNOSIS — E875 Hyperkalemia: Principal | ICD-10-CM

## 2015-11-18 DIAGNOSIS — Z940 Kidney transplant status: Secondary | ICD-10-CM | POA: Diagnosis not present

## 2015-11-18 DIAGNOSIS — N189 Chronic kidney disease, unspecified: Secondary | ICD-10-CM | POA: Diagnosis not present

## 2015-11-18 LAB — BASIC METABOLIC PANEL
ANION GAP: 5 (ref 5–15)
ANION GAP: 5 (ref 5–15)
Anion gap: 10 (ref 5–15)
BUN: 54 mg/dL — ABNORMAL HIGH (ref 6–20)
BUN: 50 mg/dL — ABNORMAL HIGH (ref 6–20)
BUN: 7 mg/dL (ref 6–20)
CHLORIDE: 119 mmol/L — AB (ref 101–111)
CHLORIDE: 98 mmol/L — AB (ref 101–111)
CO2: 16 mmol/L — AB (ref 22–32)
CO2: 15 mmol/L — ABNORMAL LOW (ref 22–32)
CO2: 26 mmol/L (ref 22–32)
Calcium: 10.1 mg/dL (ref 8.9–10.3)
Calcium: 10.4 mg/dL — ABNORMAL HIGH (ref 8.9–10.3)
Calcium: 8.9 mg/dL (ref 8.9–10.3)
Chloride: 118 mmol/L — ABNORMAL HIGH (ref 101–111)
Creatinine, Ser: 3.2 mg/dL — ABNORMAL HIGH (ref 0.44–1.00)
Creatinine, Ser: 2.95 mg/dL — ABNORMAL HIGH (ref 0.44–1.00)
Creatinine, Ser: 1.18 mg/dL — ABNORMAL HIGH (ref 0.44–1.00)
GFR calc Af Amer: >60 mL/min (ref >60)
GFR calc non Af Amer: 16 mL/min — ABNORMAL LOW (ref >60)
GFR calc non Af Amer: 18 mL/min — ABNORMAL LOW (ref >60)
GFR calc non Af Amer: 54 mL/min — ABNORMAL LOW (ref >60)
GFR, EST AFRICAN AMERICAN: 19 mL/min — AB (ref >60)
GFR, EST AFRICAN AMERICAN: 21 mL/min — AB (ref >60)
GLUCOSE: 140 mg/dL — AB (ref 65–99)
GLUCOSE: 239 mg/dL — AB (ref 65–99)
Glucose, Bld: 132 mg/dL — ABNORMAL HIGH (ref 65–99)
POTASSIUM: 6.3 mmol/L — AB (ref 3.5–5.1)
POTASSIUM: 3.2 mmol/L — AB (ref 3.5–5.1)
Potassium: 7.3 mmol/L (ref 3.5–5.1)
SODIUM: 138 mmol/L (ref 135–145)
SODIUM: 134 mmol/L — AB (ref 135–145)
Sodium: 140 mmol/L (ref 135–145)

## 2015-11-18 LAB — GLUCOSE, CAPILLARY
GLUCOSE-CAPILLARY: 115 mg/dL — AB (ref 65–99)
GLUCOSE-CAPILLARY: 202 mg/dL — AB (ref 65–99)
GLUCOSE-CAPILLARY: 127 mg/dL — AB (ref 65–99)
Glucose-Capillary: 95 mg/dL (ref 65–99)

## 2015-11-18 LAB — COMPREHENSIVE METABOLIC PANEL
ALBUMIN: 3.8 g/dL (ref 3.5–5.0)
ALK PHOS: 66 U/L (ref 38–126)
ALT: 13 U/L — ABNORMAL LOW (ref 14–54)
ANION GAP: 4 — AB (ref 5–15)
AST: 10 U/L — ABNORMAL LOW (ref 15–41)
BILIRUBIN TOTAL: 0.4 mg/dL (ref 0.3–1.2)
BUN: 64 mg/dL — ABNORMAL HIGH (ref 6–20)
CALCIUM: 10.2 mg/dL (ref 8.9–10.3)
CO2: 17 mmol/L — ABNORMAL LOW (ref 22–32)
Chloride: 117 mmol/L — ABNORMAL HIGH (ref 101–111)
Creatinine, Ser: 3.59 mg/dL — ABNORMAL HIGH (ref 0.44–1.00)
GFR calc non Af Amer: 14 mL/min — ABNORMAL LOW (ref >60)
GFR, EST AFRICAN AMERICAN: 16 mL/min — AB (ref >60)
Glucose, Bld: 106 mg/dL — ABNORMAL HIGH (ref 65–99)
POTASSIUM: 7.3 mmol/L — AB (ref 3.5–5.1)
Sodium: 138 mmol/L (ref 135–145)
TOTAL PROTEIN: 6.2 g/dL — AB (ref 6.5–8.1)

## 2015-11-18 LAB — URINALYSIS, ROUTINE W REFLEX MICROSCOPIC
Bilirubin Urine: NEGATIVE
GLUCOSE, UA: NEGATIVE mg/dL
HGB URINE DIPSTICK: NEGATIVE
KETONES UR: NEGATIVE mg/dL
Leukocytes, UA: NEGATIVE
Nitrite: NEGATIVE
PROTEIN: NEGATIVE mg/dL
Specific Gravity, Urine: 1.016 (ref 1.005–1.030)
pH: 5 (ref 5.0–8.0)

## 2015-11-18 LAB — CBC
HEMATOCRIT: 26.1 % — AB (ref 36.0–46.0)
Hemoglobin: 8.3 g/dL — ABNORMAL LOW (ref 12.0–15.0)
MCH: 31.7 pg (ref 26.0–34.0)
MCHC: 31.8 g/dL (ref 30.0–36.0)
MCV: 99.6 fL (ref 78.0–100.0)
Platelets: 181 10*3/uL (ref 150–400)
RBC: 2.62 MIL/uL — ABNORMAL LOW (ref 3.87–5.11)
RDW: 18.9 % — AB (ref 11.5–15.5)
WBC: 5.4 10*3/uL (ref 4.0–10.5)

## 2015-11-18 MED ORDER — PANTOPRAZOLE SODIUM 40 MG PO TBEC
40.0000 mg | DELAYED_RELEASE_TABLET | Freq: Every day | ORAL | Status: DC
  Administered 2015-11-18 – 2015-11-20 (×3): 40 mg via ORAL
  Filled 2015-11-18 (×3): qty 1

## 2015-11-18 MED ORDER — INSULIN ASPART 100 UNIT/ML ~~LOC~~ SOLN
0.0000 [IU] | Freq: Three times a day (TID) | SUBCUTANEOUS | Status: DC
  Administered 2015-11-18 – 2015-11-19 (×2): 3 [IU] via SUBCUTANEOUS
  Administered 2015-11-19 – 2015-11-20 (×2): 2 [IU] via SUBCUTANEOUS
  Administered 2015-11-20: 1 [IU] via SUBCUTANEOUS

## 2015-11-18 MED ORDER — ASPIRIN EC 81 MG PO TBEC
81.0000 mg | DELAYED_RELEASE_TABLET | Freq: Every day | ORAL | Status: DC
  Administered 2015-11-18 – 2015-11-20 (×3): 81 mg via ORAL
  Filled 2015-11-18 (×3): qty 1

## 2015-11-18 MED ORDER — INSULIN ASPART 100 UNIT/ML ~~LOC~~ SOLN
0.0000 [IU] | Freq: Every day | SUBCUTANEOUS | Status: DC
  Administered 2015-11-19: 2 [IU] via SUBCUTANEOUS

## 2015-11-18 MED ORDER — LACTULOSE 10 GM/15ML PO SOLN
10.0000 g | Freq: Once | ORAL | Status: AC
  Administered 2015-11-18: 10 g via ORAL
  Filled 2015-11-18 (×2): qty 15

## 2015-11-18 MED ORDER — DEXTROSE 50 % IV SOLN
50.0000 mL | Freq: Once | INTRAVENOUS | Status: AC
  Administered 2015-11-18: 50 mL via INTRAVENOUS
  Filled 2015-11-18: qty 50

## 2015-11-18 MED ORDER — FUROSEMIDE 10 MG/ML IJ SOLN
40.0000 mg | Freq: Once | INTRAMUSCULAR | Status: AC
  Administered 2015-11-18: 40 mg via INTRAVENOUS
  Filled 2015-11-18: qty 4

## 2015-11-18 MED ORDER — ENOXAPARIN SODIUM 30 MG/0.3ML ~~LOC~~ SOLN
30.0000 mg | SUBCUTANEOUS | Status: DC
  Administered 2015-11-18 – 2015-11-20 (×3): 30 mg via SUBCUTANEOUS
  Filled 2015-11-18 (×3): qty 0.3

## 2015-11-18 MED ORDER — GABAPENTIN 300 MG PO CAPS
300.0000 mg | ORAL_CAPSULE | Freq: Every day | ORAL | Status: DC
  Administered 2015-11-18 – 2015-11-19 (×2): 300 mg via ORAL
  Filled 2015-11-18 (×2): qty 1

## 2015-11-18 MED ORDER — SODIUM CHLORIDE 0.9 % IV SOLN: 100.0000 mL | INTRAVENOUS | Status: DC | PRN

## 2015-11-18 MED ORDER — HEPARIN SODIUM (PORCINE) 1000 UNIT/ML DIALYSIS: 40.0000 [IU]/kg | INTRAMUSCULAR | Status: DC | PRN

## 2015-11-18 MED ORDER — ONDANSETRON HCL 4 MG/2ML IJ SOLN
4.0000 mg | Freq: Four times a day (QID) | INTRAMUSCULAR | Status: DC | PRN
  Administered 2015-11-18: 4 mg via INTRAVENOUS

## 2015-11-18 MED ORDER — SULFAMETHOXAZOLE-TRIMETHOPRIM 400-80 MG PO TABS
1.0000 | ORAL_TABLET | Freq: Every day | ORAL | Status: DC
  Administered 2015-11-18 – 2015-11-20 (×3): 1 via ORAL
  Filled 2015-11-18 (×3): qty 1

## 2015-11-18 MED ORDER — SODIUM CHLORIDE 0.9 % IV SOLN
1.0000 g | Freq: Once | INTRAVENOUS | Status: AC
  Administered 2015-11-18: 1 g via INTRAVENOUS
  Filled 2015-11-18: qty 10

## 2015-11-18 MED ORDER — LIDOCAINE-PRILOCAINE 2.5-2.5 % EX CREA: 1.0000 "application " | TOPICAL_CREAM | CUTANEOUS | Status: DC | PRN

## 2015-11-18 MED ORDER — CARVEDILOL 12.5 MG PO TABS
12.5000 mg | ORAL_TABLET | Freq: Two times a day (BID) | ORAL | Status: DC
  Administered 2015-11-18 – 2015-11-20 (×5): 12.5 mg via ORAL
  Filled 2015-11-18 (×5): qty 1

## 2015-11-18 MED ORDER — LIDOCAINE HCL (PF) 1 % IJ SOLN: 5.0000 mL | INTRAMUSCULAR | Status: DC | PRN

## 2015-11-18 MED ORDER — ALTEPLASE 2 MG IJ SOLR: 2.0000 mg | Freq: Once | INTRAMUSCULAR | Status: DC | PRN

## 2015-11-18 MED ORDER — ONDANSETRON HCL 4 MG/2ML IJ SOLN
4.0000 mg | Freq: Four times a day (QID) | INTRAMUSCULAR | Status: DC
  Filled 2015-11-18: qty 2

## 2015-11-18 MED ORDER — TACROLIMUS 1 MG PO CAPS
3.0000 mg | ORAL_CAPSULE | Freq: Two times a day (BID) | ORAL | Status: DC
  Administered 2015-11-18 – 2015-11-19 (×3): 3 mg via ORAL
  Filled 2015-11-18 (×3): qty 3

## 2015-11-18 MED ORDER — AMLODIPINE BESYLATE 10 MG PO TABS
10.0000 mg | ORAL_TABLET | Freq: Every day | ORAL | Status: DC
  Administered 2015-11-18 – 2015-11-20 (×3): 10 mg via ORAL
  Filled 2015-11-18 (×3): qty 1

## 2015-11-18 MED ORDER — MYCOPHENOLATE SODIUM 180 MG PO TBEC
720.0000 mg | DELAYED_RELEASE_TABLET | Freq: Two times a day (BID) | ORAL | Status: DC
  Administered 2015-11-18 – 2015-11-20 (×5): 720 mg via ORAL
  Filled 2015-11-18 (×5): qty 4

## 2015-11-18 MED ORDER — SODIUM POLYSTYRENE SULFONATE 15 GM/60ML PO SUSP
30.0000 g | Freq: Two times a day (BID) | ORAL | Status: AC
  Administered 2015-11-18: 30 g via ORAL
  Filled 2015-11-18 (×3): qty 120

## 2015-11-18 MED ORDER — INSULIN ASPART 100 UNIT/ML IV SOLN
10.0000 [IU] | Freq: Once | INTRAVENOUS | Status: AC
  Administered 2015-11-18: 10 [IU] via INTRAVENOUS
  Filled 2015-11-18: qty 1

## 2015-11-18 MED ORDER — SODIUM BICARBONATE 650 MG PO TABS
650.0000 mg | ORAL_TABLET | Freq: Three times a day (TID) | ORAL | Status: DC
  Administered 2015-11-18 – 2015-11-20 (×5): 650 mg via ORAL
  Filled 2015-11-18 (×5): qty 1

## 2015-11-18 MED ORDER — ACYCLOVIR 400 MG PO TABS
400.0000 mg | ORAL_TABLET | Freq: Two times a day (BID) | ORAL | Status: DC
  Administered 2015-11-18 – 2015-11-20 (×5): 400 mg via ORAL
  Filled 2015-11-18 (×5): qty 1

## 2015-11-18 MED ORDER — SODIUM POLYSTYRENE SULFONATE 15 GM/60ML PO SUSP
30.0000 g | Freq: Once | ORAL | Status: AC
  Administered 2015-11-18: 30 g via ORAL
  Filled 2015-11-18: qty 120

## 2015-11-18 MED ORDER — FLUOXETINE HCL 20 MG PO CAPS
20.0000 mg | ORAL_CAPSULE | Freq: Every day | ORAL | Status: DC
  Administered 2015-11-18 – 2015-11-20 (×3): 20 mg via ORAL
  Filled 2015-11-18 (×3): qty 1

## 2015-11-18 MED ORDER — PREDNISONE 10 MG PO TABS
10.0000 mg | ORAL_TABLET | Freq: Every day | ORAL | Status: DC
  Administered 2015-11-18 – 2015-11-20 (×3): 10 mg via ORAL
  Filled 2015-11-18 (×3): qty 1

## 2015-11-18 MED ORDER — LACTULOSE 10 GM/15ML PO SOLN
20.0000 g | Freq: Three times a day (TID) | ORAL | Status: DC
  Administered 2015-11-19: 20 g via ORAL
  Filled 2015-11-18 (×4): qty 30

## 2015-11-18 MED ORDER — HEPARIN SODIUM (PORCINE) 1000 UNIT/ML DIALYSIS: 1000.0000 [IU] | INTRAMUSCULAR | Status: DC | PRN

## 2015-11-18 MED ORDER — PENTAFLUOROPROP-TETRAFLUOROETH EX AERO: 1.0000 "application " | INHALATION_SPRAY | CUTANEOUS | Status: DC | PRN

## 2015-11-18 MED ORDER — INSULIN GLARGINE 100 UNIT/ML ~~LOC~~ SOLN
11.0000 [IU] | Freq: Every day | SUBCUTANEOUS | Status: DC
  Administered 2015-11-18 – 2015-11-19 (×2): 11 [IU] via SUBCUTANEOUS
  Filled 2015-11-18 (×3): qty 0.11

## 2015-11-18 NOTE — Procedures (Signed)
Patient was seen on dialysis and the procedure was supervised.  BFR 300  Via AVF BP is  157/61.   Patient appears to be tolerating treatment well- sem-iemergent HD in the setting of delayed graft function and extreme hyperkalemia   Cynthia [name] A 11/18/2015

## 2015-11-18 NOTE — ED Provider Notes (Signed)
CSN: [id number]     Arrival date & time 11/17/15  2303 History   First MD Initiated Contact with Patient 11/17/15 2355     Chief Complaint  Patient presents with  . Abnormal Lab     (Consider location/radiation/quality/duration/timing/severity/associated sxs/prior Treatment) HPI   Patient to the ER with complaints of hypertension, depression, GERD, neuromuscular disorder, history of blood transfusion, anemia, anxiety, insulin-dependent Type I. Comes to the ER after being called by [facility] with a lab result showing hyperkalemia at 7.3 and is a kidney transplant patient. Patient endorses symptoms of fatigue but is not having any pain or any other associated symptoms.  TRANSPLANT KIDNEY 10/14/2015 Left Procedure: RENAL ALLOTRANSPLANTATION, IMPLANTATION OF GRAFT; WITHOUT RECIPIENT NEPHRECTOMY; Surgeon: Peggye Ley, MD; Location: [facility] OPERATING ROOMS; Service: General Surgery; Laterality: Left; laterality deleted from posting per R. Schmitz/ Adella Nissen     Past Medical History  Diagnosis Date  . Hypertension   . Depression   . GERD (gastroesophageal reflux disease)   . Neuromuscular disorder ([place])     NEUROPATHY  . History of blood transfusion     transfusion reaction  . Anemia   . Anxiety   . ESRD on hemodialysis ([place])     Home HD 5x per week  . 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 Kidney transplant recipient    Past Surgical History  Procedure Laterality Date  . Cholecystectomy    . Abdominal hysterectomy    . Breast surgery Bilateral     biopsy bilateral  . Tubal ligation    . Dialysis fistula creation Left   . Revison of arteriovenous fistula Left 09/28/2013    Procedure: EXCISE ESCHAR LEFT ARM  ARTERIOVENOUS FISTULA WITH PLICATION OF LEFT ARM ARTERIOVENOUS FISTULA;  Surgeon: Elam Dutch, MD;  Location: [place];  Service: Vascular;  Laterality: Left;  . Left heart catheterization with coronary angiogram N/A 03/30/2014    Procedure: LEFT HEART CATHETERIZATION WITH CORONARY ANGIOGRAM;  Surgeon: Sinclair Grooms, MD;  Location: [facility] CATH LAB;  Service: Cardiovascular;  Laterality: N/A;  . Resection of arteriovenous fistula aneurysm Left 07/07/2015    Procedure: REPAIR OF ARTERIOVENOUS FISTULA ANEURYSM;  Surgeon: Serafina Mitchell, MD;  Location: [facility];  Service: Vascular;  Laterality: Left;  Marland Kitchen Eye surgery Bilateral     lazer  . Cataract extraction Bilateral     bilateral  . Revison of arteriovenous fistula Left 08/26/2015    Procedure: RESECTION ANEURYSM OF LEFT ARM ARTERIOVENOUS FISTULA  ;  Surgeon: Serafina Mitchell, MD;  Location: [place];  Service: Vascular;  Laterality: Left;  . Breast lumpectomy with radioactive seed localization Right 09/14/2015    Procedure: RIGHT BREAST LUMPECTOMY WITH RADIOACTIVE SEED LOCALIZATION;  Surgeon: Donnie Mesa, MD;  Location: [place];  Service: General;  Laterality: Right;   Family History  Problem Relation Age of Onset  . Cancer Maternal Grandmother   . Diabetes Maternal Grandfather   . Diabetes Paternal Grandmother   . Diabetes Mother   . Kidney disease Mother   . Diabetes Father   . Heart disease Father   . Kidney disease Father   . Diabetes Sister   . Asthma Sister   . Lupus Sister   . Diabetes Brother    Social History  Substance Use Topics  . Smoking status: Never Smoker   . Smokeless tobacco: Never Used  . Alcohol Use: No   OB History    No data  available     Review of Systems  Review of Systems All other systems negative except as documented in the HPI. All pertinent positives and negatives as reviewed in the HPI.   Allergies  Tape  Home Medications   Prior to Admission medications   Medication Sig Start Date End Date Taking? Authorizing Provider  acyclovir (ZOVIRAX) 400 MG tablet Take 400 mg by mouth 2 (two) times daily.   Yes Historical Provider, MD  amLODipine (NORVASC) 10 MG tablet Take 10 mg by mouth daily.   Yes Historical  Provider, MD  aspirin EC 81 MG EC tablet Take 1 tablet (81 mg total) by mouth daily. 03/31/14  Yes Katheren Shams, DO  carvedilol (COREG) 25 MG tablet Take 12.5 mg by mouth 2 (two) times daily.  06/29/15  Yes Historical Provider, MD  cloNIDine (CATAPRES) 0.1 MG tablet Take 2 tablets (0.2 mg total) by mouth 2 (two) times daily. Patient taking differently: Take 0.2 mg by mouth daily as needed (high blood pressure).  04/18/15  Yes Everlene Balls, MD  FLUoxetine (PROZAC) 20 MG tablet Take 20 mg by mouth daily.   Yes Historical Provider, MD  gabapentin (NEURONTIN) 300 MG capsule Take 300 mg by mouth at bedtime. 11/16/14  Yes Historical Provider, MD  Insulin Glargine (LANTUS SOLOSTAR) 100 UNIT/ML Solostar Pen Inject 11 Units into the skin at bedtime.    Yes Historical Provider, MD  insulin lispro (HUMALOG) 100 UNIT/ML injection Inject 15 Units into the skin 3 (three) times daily before meals.   Yes Historical Provider, MD  mycophenolate (MYFORTIC) 180 MG EC tablet Take 720 mg by mouth 2 (two) times daily.   Yes Historical Provider, MD  ondansetron (ZOFRAN) 4 MG tablet Take 4 mg by mouth every 8 (eight) hours as needed for nausea or vomiting.   Yes Historical Provider, MD  pantoprazole (PROTONIX) 40 MG tablet Take 40 mg by mouth daily. 08/02/15  Yes Historical Provider, MD  predniSONE (DELTASONE) 10 MG tablet Take 10 mg by mouth daily with breakfast.   Yes Historical Provider, MD  senna (SENOKOT) 8.6 MG TABS tablet Take 1 tablet by mouth daily.   Yes Historical Provider, MD  sulfamethoxazole-trimethoprim (BACTRIM,SEPTRA) 400-80 MG tablet Take 1 tablet by mouth daily.   Yes Historical Provider, MD  tacrolimus (PROGRAF) 1 MG capsule Take 3 mg by mouth 2 (two) times daily.   Yes Historical Provider, MD   BP 172/63 mmHg  Pulse 82  Temp(Src) 98.1 F (36.7 C) (Oral)  Resp 15  SpO2 100% Physical Exam  Constitutional: She appears well-developed and well-nourished. No distress.  HENT:  Head: Normocephalic and  atraumatic.  Right Ear: Tympanic membrane and ear canal normal.  Left Ear: Tympanic membrane and ear canal normal.  Nose: Nose normal.  Mouth/Throat: Uvula is midline, oropharynx is clear and moist and mucous membranes are normal.  Eyes: Pupils are equal, round, and reactive to light.  Neck: Normal range of motion. Neck supple.  Cardiovascular: Normal rate and regular rhythm.   Pulmonary/Chest: Effort normal.  Abdominal: Soft.  No signs of abdominal distention  Musculoskeletal:  No LE swelling  Neurological: She is alert.  Acting at baseline  Skin: Skin is warm and dry. No rash noted.  Nursing note and vitals reviewed.   ED Course  Procedures (including critical care time) Labs Review Labs Reviewed  CBC WITH DIFFERENTIAL/PLATELET - Abnormal; Notable for the following:    RBC 2.94 (*)    Hemoglobin 9.3 (*)    HCT 29.8 (*)  MCV 101.4 (*)    RDW 19.2 (*)    All other components within normal limits  COMPREHENSIVE METABOLIC PANEL - Abnormal; Notable for the following:    Potassium 7.3 (*)    Chloride 117 (*)    CO2 17 (*)    Glucose, Bld 106 (*)    BUN 64 (*)    Creatinine, Ser 3.59 (*)    Total Protein 6.2 (*)    AST 10 (*)    ALT 13 (*)    GFR calc non Af Amer 14 (*)    GFR calc Af Amer 16 (*)    Anion gap 4 (*)    All other components within normal limits  URINALYSIS, ROUTINE W REFLEX MICROSCOPIC (NOT AT [facility])    Imaging Review No results found.   I have personally reviewed and evaluated these images and lab results as part of my medical decision-making.  BRUNELLA, TRANSOU [id number] 18-Nov-2015 01:49:27 [facility]/ED ROUTINE RECORD Sinus rhythm 35mm/s 56mm/mV 100Hz  8.0 SP2 CID: [id number] Referred by: Unconfirmed Vent. rate 81 BPM PR interval 151 ms QRS duration 92 ms QT/QTc 355/412 ms P-R-T axes 52 -5 67   MDM   Final diagnoses:  Hyperkalemia  Hx of kidney transplant    Medications  calcium gluconate 1 g in sodium chloride 0.9 %  100 mL IVPB (0 g Intravenous Stopped 11/18/15 0132)  insulin aspart (novoLOG) injection 10 Units (10 Units Intravenous Given 11/18/15 0059)  dextrose 50 % solution 50 mL (50 mLs Intravenous Given 11/18/15 0100)  sodium polystyrene (KAYEXALATE) 15 GM/60ML suspension 30 g (30 g Oral Given 11/18/15 0132)    Patients potassium is 7.4 in the ED today which is 0.1 more elevated than earlier this morning.  I spoke with the Fellow at [facility], Dr. Murlean Hark regarding patient. After a lengthy discussion he believes that the insulin, D50, calcium gluconate and kayexalate will be enough to resolve the potassium. He does not recommend dialysis at this time as they are trying to "wake the kidneys up". No lasix at this time. He does not recommend transfer to [facility] at this time. He says that the potassium can be rechecked in the morning and so long as it is below 6.0 (even if it is 5.9) the patient can be discharged home. The daughter has been instructed to call the Care Coordinator and give an update tomorrow. The patient otherwise is well appearing.  I spoke with Dr. Loleta Books, he has agreed for admission, obs, Tele, [facility] admits, [facility] hospitalist.  Delos Haring, PA-C 11/18/15 [id number]  Forde Dandy, MD 11/18/15 [phone]

## 2015-11-18 NOTE — ED Notes (Signed)
Called [facility] requested Dr. Azzie Glatter not on call tonight--on call dr will call Dr Carlota Raspberry @ [phone]

## 2015-11-18 NOTE — ED Notes (Signed)
hospitalist at the bedside 

## 2015-11-18 NOTE — H&P (Signed)
History and Physical  Patient Name: Cynthia [name]     [id number]    DOB: [birth year]    DOA: 11/17/2015 PCP: [name], MD   Patient coming from: Home  Chief Complaint: Abnormal lab  HPI: Cynthia [name] is a 45 y.o. female with a past medical history significant for ESRD previously on home HD, underwent DDKT on 09/2015 at [facility], with some delayed graft failure, also IDDM and HTN who presents with hyperkalemia.  All history collected through interpreter and family member as interpreter.  Per CareEverywhere: she had her graft placed on 10/14/15 at [facility], required HD on 4/24, 4/26, biopsy on 4/25 showed ATN no rejection.  Required 2u PRBCs on 4/25, back to ER on 4/30 for dysuria.  Creatinine trending up in first week May and so dialyzed again for a week.  Since then has been following Renal panels closely and titrating medicines by phone with [facility].  Using lactulose and furosemide PRN for hyperkalemia and volume overload.  Making about 800cc urine daily.   Creat K 5/9 3.8 3.3 5/16 4.8 3.8  5/18 5.2 4.5 5/25 3.4 7  Because of that last K today, she was sent to the ER here at [facility].  ED course: -Afebrile, hemodynamically stable -Na 138, K 7.3, Cr 3.59, WBC 6.9K, Hgb 9.3 (at baseline)   The patient noted some fatigue, mild nausea, dizziness, but no swelling, change in urine character or volume or fever.  Her UOP was documented this past week increasing daily, up to 1.7L yesterday.  Her ECG showed only 92mm T waves, normal QRS.  The case was discussed with Dr. Murlean Hark from [facility] by phone who recommended temporizing measures of Ca and insulin/D50 as well as kayexalate.  He recommended repeat K in AM and if <6 mmol/L, to discharge home with phone follow up.        Review of Systems:  Pt complains of nausea, occasional dizziness, occasional chills (both for weeks). Pt denies any fever, vomiting, swelling, decreased urine, cloudy or smelly urine.  All other systems negative  except as just noted or noted in the history of present illness.    Past Medical History  Diagnosis Date  . Hypertension   . Depression   . GERD (gastroesophageal reflux disease)   . Neuromuscular disorder ([place])     NEUROPATHY  . History of blood transfusion     transfusion reaction  . Anemia   . Anxiety   . ESRD on hemodialysis ([place])     Home HD 5x per week  . 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 Kidney transplant recipient     Past Surgical History  Procedure Laterality Date  . Cholecystectomy    . Abdominal hysterectomy    . Breast surgery Bilateral     biopsy bilateral  . Tubal ligation    . Dialysis fistula creation Left   . Revison of arteriovenous fistula Left 09/28/2013    Procedure: EXCISE ESCHAR LEFT ARM  ARTERIOVENOUS FISTULA WITH PLICATION OF LEFT ARM ARTERIOVENOUS FISTULA;  Surgeon: Elam Dutch, MD;  Location: [place];  Service: Vascular;  Laterality: Left;  . Left heart catheterization with coronary angiogram N/A 03/30/2014    Procedure: LEFT HEART CATHETERIZATION WITH CORONARY ANGIOGRAM;  Surgeon: Sinclair Grooms, MD;  Location: [facility] CATH LAB;  Service: Cardiovascular;  Laterality: N/A;  . 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 Cataract extraction Bilateral     bilateral  . Revison of arteriovenous fistula Left 08/26/2015    Procedure: RESECTION ANEURYSM OF LEFT ARM ARTERIOVENOUS FISTULA  ;  Surgeon: Serafina Mitchell, MD;  Location: [place];  Service: Vascular;  Laterality: Left;  . Breast lumpectomy with radioactive seed localization Right 09/14/2015    Procedure: RIGHT BREAST LUMPECTOMY WITH RADIOACTIVE SEED LOCALIZATION;  Surgeon: Donnie Mesa, MD;  Location: [place];  Service: General;  Laterality:  Right;    Social History: Patient lives with her daughter and family.  The patient walks unassisted.  She does not smoke.  She is spanish speaking.   She is independent with all ADLs.  Allergies  Allergen Reactions  . Tape Other (See Comments)    Burns skin.  Please use paper tape only    Family history: family history includes Asthma in her sister; Cancer in her maternal grandmother; Diabetes in her brother, father, maternal grandfather, mother, paternal grandmother, and sister; Heart disease in her father; Kidney disease in her father and mother; Lupus in her sister.  Prior to Admission medications   Medication Sig Start Date End Date Taking? Authorizing Provider  acyclovir (ZOVIRAX) 400 MG tablet Take 400 mg by mouth 2 (two) times daily.   Yes Historical Provider, MD  amLODipine (NORVASC) 10 MG tablet Take 10 mg by mouth daily.   Yes Historical Provider, MD  aspirin EC 81 MG EC tablet Take 1 tablet (81 mg total) by mouth daily. 03/31/14  Yes Katheren Shams, DO  carvedilol (COREG) 25 MG tablet Take 12.5 mg by mouth 2 (two) times daily.  06/29/15  Yes Historical Provider, MD  cloNIDine (CATAPRES) 0.1 MG tablet Take 2 tablets (0.2 mg total) by mouth 2 (two) times daily. Patient taking differently: Take 0.2 mg by mouth daily as needed (high blood pressure).  04/18/15  Yes Everlene Balls, MD  FLUoxetine (PROZAC) 20 MG tablet Take 20 mg by mouth daily.   Yes Historical Provider, MD  gabapentin (NEURONTIN) 300 MG capsule Take 300 mg by mouth at bedtime. 11/16/14  Yes Historical Provider, MD  Insulin Glargine (LANTUS SOLOSTAR) 100 UNIT/ML Solostar Pen Inject 11 Units into the skin at bedtime.    Yes Historical Provider, MD  insulin lispro (HUMALOG) 100 UNIT/ML injection Inject 15 Units into the skin 3 (three) times daily before meals.   Yes Historical Provider, MD  mycophenolate (MYFORTIC) 180 MG EC tablet Take 720 mg by mouth 2 (two) times daily.   Yes Historical Provider, MD  ondansetron (ZOFRAN) 4  MG tablet Take 4 mg by mouth every 8 (eight) hours as needed for nausea or vomiting.   Yes Historical Provider, MD  pantoprazole (PROTONIX) 40 MG tablet Take 40 mg by mouth daily. 08/02/15  Yes Historical Provider, MD  predniSONE (DELTASONE) 10 MG tablet Take 10 mg by mouth daily with breakfast.   Yes Historical Provider, MD  senna (SENOKOT) 8.6 MG TABS tablet Take 1 tablet by mouth daily.   Yes Historical Provider, MD  sulfamethoxazole-trimethoprim (BACTRIM,SEPTRA) 400-80 MG tablet Take 1 tablet by mouth daily.   Yes Historical Provider, MD  tacrolimus (PROGRAF) 1 MG capsule Take 3 mg by mouth 2 (two) times daily.   Yes Historical Provider, MD       Physical Exam: BP 172/63 mmHg  Pulse 82  Temp(Src)  98.1 F (36.7 C) (Oral)  Resp 15  SpO2 100% General appearance: Well-developed, overweight adult female, alert and in no acute distress.   Eyes: Anicteric, conjunctiva pink, lids and lashes normal.     ENT: No nasal deformity, discharge, or epistaxis.  OP moist without lesions.   Skin: Warm and dry.  No jaundice.  No suspicious rashes or lesions. Cardiac: RRR, nl S1-S2, there is an echoing systolic bruit audible over the chest and back that obscures heart sounds, from the patient's graft.  Capillary refill is brisk.  JVP normal.  No LE edema.  Radial and DP pulses 2+ and symmetric. Respiratory: Normal respiratory rate and rhythm.  CTAB without rales or wheezes. Abdomen: Abdomen soft without rigidity.  Graft site healing.  No graft tenderness.  No TTP. No ascites, distension.   MSK: No deformities or effusions. Neuro: Sensorium intact and responding to questions, attention normal.  Speech is fluent.  Moves all extremities equally and with normal coordination.    Psych: Behavior appropriate.  Affect normal.  No evidence of aural or visual hallucinations or delusions.       Labs on Admission:  I have personally reviewed following labs and imaging studies: CBC:  Recent Labs Lab  11/17/15 2337  WBC 6.9  NEUTROABS 5.6  HGB 9.3*  HCT 29.8*  MCV 101.4*  PLT 123456   Basic Metabolic Panel:  Recent Labs Lab 11/17/15 2337  NA 138  K 7.3*  CL 117*  CO2 17*  GLUCOSE 106*  BUN 64*  CREATININE 3.59*  CALCIUM 10.2   GFR: CrCl cannot be calculated (Unknown ideal weight.). Liver Function Tests:  Recent Labs Lab 11/17/15 2337  AST 10*  ALT 13*  ALKPHOS 66  BILITOT 0.4  PROT 6.2*  ALBUMIN 3.8   No results for input(s): LIPASE, AMYLASE in the last 168 hours. No results for input(s): AMMONIA in the last 168 hours. Coagulation Profile: No results for input(s): INR, PROTIME in the last 168 hours. Cardiac Enzymes: No results for input(s): CKTOTAL, CKMB, CKMBINDEX, TROPONINI in the last 168 hours. BNP (last 3 results) No results for input(s): PROBNP in the last 8760 hours. HbA1C: No results for input(s): HGBA1C in the last 72 hours. CBG: No results for input(s): GLUCAP in the last 168 hours. Lipid Profile: No results for input(s): CHOL, HDL, LDLCALC, TRIG, CHOLHDL, LDLDIRECT in the last 72 hours. Thyroid Function Tests: No results for input(s): TSH, T4TOTAL, FREET4, T3FREE, THYROIDAB in the last 72 hours. Anemia Panel: No results for input(s): VITAMINB12, FOLATE, FERRITIN, TIBC, IRON, RETICCTPCT in the last 72 hours. Sepsis Labs: @LABRCNTIP (procalcitonin:4,lacticidven:4) )No results found for this or any previous visit (from the past 240 hour(s)).       Radiological Exams on Admission: Personally reviewed: No results found.  EKG: Independently reviewed. Rate 81, QTc 412, QRS normal, 92 ms.  The T waves are minimally elevated, asymmetric.    Assessment/Plan 1. Hyperkalemia:  -Telemetry observation -Kayexalate given, no BM yet, will give lactulose once now to avoid interaction with other meds -Neph advised to avoid furosemide  -Repeat BMP tomorrow   2. Transplant of kidney:  -Continue tacrolimus, prednisone, and MMF -Continue Bactrim and  acyclovir PPx  3. IDDM:  -Continue home glargine -Sliding scale corrections  4. Hypertension:  -Continue carvedilol and amlodipine  5. Anemia of renal disease:  Stable  6. Neuropathy: -Continue gabapentin  7. Depression: -Continue fluoxetine   DVT prophylaxis: Lovenox, renal dose  Code Status: FULL  Family Communication: Daughter at bedside, family  preferred to interpret for patient, plan discussed, all questions answered  Disposition Plan: Anticipate observation overnight, lactulose, repeat BMP tomorrow.  If K < 6 mol/L, discharge, otherwise repeat lactulose.  [facility] available by phone at [phone] through operator. Consults called: None Admission status: Observation, tele   Medical decision making: Patient seen at 1:50 AM on 11/18/2015.  The patient was discussed with Delos Haring, PA-C. What exists of the patient's chart and outside records in [place] was reviewed in depth.  Clinical condition: stable.        Edwin Dada [facility] Pager [phone]

## 2015-11-18 NOTE — Progress Notes (Signed)
Patient seen and examined  Cynthia [name] is a 45 y.o. female with a past medical history significant for ESRD previously on home HD, underwent DDKT on 09/2015 at [facility], with some delayed graft failure, also IDDM and HTN who presents with hyperkalemia. Patient has required intermittent hemodialysis since her transplant, with hyperkalemia managed with lactulose and furosemide. Now admitted for potassium of 7.3. Given calcium gluconate D50 and insulin in the ER. Potassium now 6.3. Consulted Dr. Posey Pronto on call for nephrology for further recommendations.

## 2015-11-18 NOTE — ED Notes (Addendum)
CRITICAL VALUE ALERT  Critical value received: Potassium 7.3

## 2015-11-18 NOTE — ED Notes (Signed)
Report given to [place], before transferring pt to the floor, RN having concerns about BP of 173/63, RN states " pt has sky high BP", RN oriented that MD are aware of Pt's BP and no orders gotten for BP, pt's main problem PTA to ED is high potassium and treatment provide by EDP for this concerns, secondary diagnosis to be treated by admitting MD on the floor.

## 2015-11-18 NOTE — Consult Note (Signed)
Reason for Consult: Hyperkalemia in patient with delayed graft function Referring Physician: Reyne Dumas M.D. [facility])  HPI:  45 year old woman of Hispanic origin with history of end-stage renal disease status post cadaveric kidney transplant at [facility] on October 14, 2015 with delayed graft function and renal biopsy showing ATN. She has been intermittently followed with labs for posttransplant management. It appears that her last dialysis was about 3 weeks ago. She was directed to come to the emergency room with hyperkalemia of 7.3 with concomitant EKG changes-overnight management has resulted in potassium decreased to 6.3 this morning. She admits to eating some mashed potatoes but otherwise no obvious potassium rich foods. She reports increasing urine output and denies any chest pain or shortness of breath. She denies any NSAIDs.  She was previously on home hemodialysis (started in-center at [place]) via left brachiocephalic fistula under the care of Dr. Joelyn Oms of our practice.  Past Medical History  Diagnosis Date  . Hypertension   . Depression   . GERD (gastroesophageal reflux disease)   . Neuromuscular disorder ([place])     NEUROPATHY  . History of blood transfusion     transfusion reaction  . Anemia   . Anxiety   . ESRD on hemodialysis ([place])     Home HD 5x per week  . 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 Kidney transplant recipient     Past Surgical History  Procedure Laterality Date  . Cholecystectomy    . Abdominal hysterectomy    . Breast surgery Bilateral     biopsy bilateral  . Tubal ligation    . Dialysis fistula creation Left   . Revison of arteriovenous fistula Left 09/28/2013    Procedure: EXCISE ESCHAR LEFT ARM  ARTERIOVENOUS FISTULA WITH PLICATION OF LEFT ARM ARTERIOVENOUS FISTULA;  Surgeon: Elam Dutch, MD;  Location: [place];  Service: Vascular;  Laterality: Left;  . Left heart catheterization with coronary angiogram N/A  03/30/2014    Procedure: LEFT HEART CATHETERIZATION WITH CORONARY ANGIOGRAM;  Surgeon: Sinclair Grooms, MD;  Location: [facility] CATH LAB;  Service: Cardiovascular;  Laterality: N/A;  . Resection of arteriovenous fistula aneurysm Left 07/07/2015    Procedure: REPAIR OF ARTERIOVENOUS FISTULA ANEURYSM;  Surgeon: Serafina Mitchell, MD;  Location: [place];  Service: Vascular;  Laterality: Left;  Marland Kitchen Eye surgery Bilateral     lazer  . Cataract extraction Bilateral     bilateral  . Revison of arteriovenous fistula Left 08/26/2015    Procedure: RESECTION ANEURYSM OF LEFT ARM ARTERIOVENOUS FISTULA  ;  Surgeon: Serafina Mitchell, MD;  Location: [place];  Service: Vascular;  Laterality: Left;  . Breast lumpectomy with radioactive seed localization Right 09/14/2015    Procedure: RIGHT BREAST LUMPECTOMY WITH RADIOACTIVE SEED LOCALIZATION;  Surgeon: Donnie Mesa, MD;  Location: [place];  Service: General;  Laterality: Right;    Family History  Problem Relation Age of Onset  . Cancer Maternal Grandmother   . Diabetes Maternal Grandfather   . Diabetes Paternal Grandmother   . Diabetes Mother   . Kidney disease Mother   . Diabetes Father   . Heart disease Father   . Kidney disease Father   . Diabetes Sister   . Asthma Sister   . Lupus Sister   . Diabetes Brother     Social History:  reports that she has never smoked. She has never used smokeless tobacco. She reports that she does not drink  alcohol or use illicit drugs.  Allergies:  Allergies  Allergen Reactions  . Tape Other (See Comments)    Burns skin.  Please use paper tape only    Medications:  Scheduled: . acyclovir  400 mg Oral BID  . amLODipine  10 mg Oral Daily  . aspirin EC  81 mg Oral Daily  . carvedilol  12.5 mg Oral BID  . enoxaparin (LOVENOX) injection  30 mg Subcutaneous Q24H  . FLUoxetine  20 mg Oral Daily  . gabapentin  300 mg Oral QHS  . insulin aspart  0-5 Units Subcutaneous QHS  . insulin aspart  0-9 Units  Subcutaneous TID WC  . insulin glargine  11 Units Subcutaneous QHS  . mycophenolate  720 mg Oral BID  . pantoprazole  40 mg Oral Daily  . predniSONE  10 mg Oral Q breakfast  . sodium polystyrene  30 g Oral BID  . sulfamethoxazole-trimethoprim  1 tablet Oral Daily  . tacrolimus  3 mg Oral BID    BMP Latest Ref Rng 11/18/2015 11/17/2015 09/14/2015  Glucose 65 - 99 mg/dL 140(H) 106(H) 118(H)  BUN 6 - 20 mg/dL 54(H) 64(H) 34(H)  Creatinine 0.44 - 1.00 mg/dL 3.20(H) 3.59(H) 5.20(H)  Sodium 135 - 145 mmol/L 140 138 135  Potassium 3.5 - 5.1 mmol/L 6.3(HH) 7.3(HH) 4.4  Chloride 101 - 111 mmol/L 119(H) 117(H) 96(L)  CO2 22 - 32 mmol/L 16(L) 17(L) -  Calcium 8.9 - 10.3 mg/dL 10.1 10.2 -    CBC Latest Ref Rng 11/17/2015 09/14/2015 08/26/2015  WBC 4.0 - 10.5 K/uL 6.9 - -  Hemoglobin 12.0 - 15.0 g/dL 9.3(L) 10.9(L) 11.6(L)  Hematocrit 36.0 - 46.0 % 29.8(L) 32.0(L) 34.0(L)  Platelets 150 - 400 K/uL 203 - -   No results found.  Review of Systems  Constitutional: Positive for malaise/fatigue. Negative for fever and weight loss.  HENT: Negative.   Eyes: Negative.   Respiratory: Negative.   Cardiovascular: Negative.   Gastrointestinal: Positive for nausea. Negative for heartburn, vomiting, abdominal pain and diarrhea.  Genitourinary: Negative.   Skin: Negative.   Neurological: Negative.  Negative for dizziness, tingling and weakness.   Blood pressure 157/61, pulse 84, temperature 98.6 F (37 C), temperature source Oral, resp. rate 16, height 5\' 1"  (1.549 m), weight 74 kg (163 lb 2.3 oz), SpO2 99 %. Physical Exam  Nursing note and vitals reviewed. Constitutional: She is oriented to person, place, and time. She appears well-developed and well-nourished. No distress.  HENT:  Head: Normocephalic and atraumatic.  Mouth/Throat: Oropharynx is clear and moist. No oropharyngeal exudate.  Eyes: EOM are normal. Pupils are equal, round, and reactive to light. No scleral icterus.  Neck: Normal range of  motion. Neck supple. No JVD present.  Cardiovascular: Normal rate and regular rhythm.   Murmur heard. 4/6 holosystolic murmur over entire precordium  Respiratory: Effort normal and breath sounds normal. She has no wheezes. She has no rales.  GI: Soft. Bowel sounds are normal.  Right lower quadrant surgical site intact  Musculoskeletal: Normal range of motion. She exhibits no edema.  Neurological: She is alert and oriented to person, place, and time.  Skin: Skin is warm and dry. No erythema.    Assessment/Plan: 1. Hyperkalemia: Secondary to delayed graft function and poorly restricted oral intake in the face of ongoing tacrolimus and Bactrim use-we discussed at length regarding low potassium intake. She status post Kayexalate therapy that has been repeated and repeat potassium check is pending. With her concomitant non-gap metabolic acidosis-I  will start her on oral sodium bicarbonate for acid buffering/potassium shift and elimination. I attempted to offer her dialysis for 2-3 hours however, she declined this encouraged by her improved creatinine. 2. Status post cadaveric kidney transplant with delayed graft function: Currently chronic kidney disease stage IV T. Overnight, creatinine appears to have improved with fair urine output. No compelling indications for dialysis. Maintain immunosuppressive therapy with tacrolimus, prednisone and MMF. 3. Hypertension: Currently on amlodipine and carvedilol, appears that she also takes when necessary clonidine-monitor blood pressures for need to restart scheduled clonidine. 4. Anemia: Hemoglobin slightly lower, continue to monitor  Roxana Lai K. 11/18/2015, 1:57 PM

## 2015-11-18 NOTE — Progress Notes (Signed)
Received call from [facility] PCP Dr.Mathew Lissa Merlin office and nurse informed that the PCP wants to change her Prograf to 5mg  twice a day instead of 3 mg of twice a day, informed Dr. Allyson Sabal via text.  Will continue to monitor the patient.

## 2015-11-18 NOTE — ED Notes (Signed)
Report attempted, RN to call back. 

## 2015-11-18 NOTE — Progress Notes (Signed)
Critical lab of K 6.3, notified the Dr. Allyson Sabal, Silva Bandy ordered.

## 2015-11-18 NOTE — Care Management Obs Status (Signed)
[place] NOTIFICATION   Patient Details  Name: Cynthia [name] MRN: [id number] Date of Birth: 07-01-1970   Medicare Observation Status Notification Given:  Yes    Tiwatope Emmitt, Rory Percy, RN 11/18/2015, 10:58 AM

## 2015-11-19 DIAGNOSIS — Z79899 Other long term (current) drug therapy: Secondary | ICD-10-CM | POA: Diagnosis not present

## 2015-11-19 DIAGNOSIS — E872 Acidosis: Secondary | ICD-10-CM | POA: Diagnosis present

## 2015-11-19 DIAGNOSIS — N184 Chronic kidney disease, stage 4 (severe): Secondary | ICD-10-CM | POA: Diagnosis present

## 2015-11-19 DIAGNOSIS — F419 Anxiety disorder, unspecified: Secondary | ICD-10-CM | POA: Diagnosis present

## 2015-11-19 DIAGNOSIS — E1122 Type 2 diabetes mellitus with diabetic chronic kidney disease: Secondary | ICD-10-CM | POA: Diagnosis present

## 2015-11-19 DIAGNOSIS — Z992 Dependence on renal dialysis: Secondary | ICD-10-CM | POA: Diagnosis not present

## 2015-11-19 DIAGNOSIS — N189 Chronic kidney disease, unspecified: Secondary | ICD-10-CM

## 2015-11-19 DIAGNOSIS — D631 Anemia in chronic kidney disease: Secondary | ICD-10-CM | POA: Diagnosis present

## 2015-11-19 DIAGNOSIS — Z940 Kidney transplant status: Secondary | ICD-10-CM | POA: Diagnosis not present

## 2015-11-19 DIAGNOSIS — Z9842 Cataract extraction status, left eye: Secondary | ICD-10-CM | POA: Diagnosis not present

## 2015-11-19 DIAGNOSIS — Z794 Long term (current) use of insulin: Secondary | ICD-10-CM | POA: Diagnosis not present

## 2015-11-19 DIAGNOSIS — F329 Major depressive disorder, single episode, unspecified: Secondary | ICD-10-CM | POA: Diagnosis present

## 2015-11-19 DIAGNOSIS — Z9841 Cataract extraction status, right eye: Secondary | ICD-10-CM | POA: Diagnosis not present

## 2015-11-19 DIAGNOSIS — E875 Hyperkalemia: Secondary | ICD-10-CM | POA: Diagnosis not present

## 2015-11-19 DIAGNOSIS — K219 Gastro-esophageal reflux disease without esophagitis: Secondary | ICD-10-CM | POA: Diagnosis present

## 2015-11-19 DIAGNOSIS — N170 Acute kidney failure with tubular necrosis: Secondary | ICD-10-CM | POA: Diagnosis not present

## 2015-11-19 DIAGNOSIS — I120 Hypertensive chronic kidney disease with stage 5 chronic kidney disease or end stage renal disease: Secondary | ICD-10-CM | POA: Diagnosis not present

## 2015-11-19 DIAGNOSIS — Z7982 Long term (current) use of aspirin: Secondary | ICD-10-CM | POA: Diagnosis not present

## 2015-11-19 DIAGNOSIS — E1140 Type 2 diabetes mellitus with diabetic neuropathy, unspecified: Secondary | ICD-10-CM | POA: Diagnosis present

## 2015-11-19 LAB — COMPREHENSIVE METABOLIC PANEL
ALK PHOS: 63 U/L (ref 38–126)
ALT: 14 U/L (ref 14–54)
AST: 13 U/L — AB (ref 15–41)
Albumin: 3.3 g/dL — ABNORMAL LOW (ref 3.5–5.0)
Anion gap: 9 (ref 5–15)
BUN: 9 mg/dL (ref 6–20)
CALCIUM: 9.1 mg/dL (ref 8.9–10.3)
CHLORIDE: 102 mmol/L (ref 101–111)
CO2: 29 mmol/L (ref 22–32)
CREATININE: 1.59 mg/dL — AB (ref 0.44–1.00)
GFR calc non Af Amer: 38 mL/min — ABNORMAL LOW (ref >60)
GFR, EST AFRICAN AMERICAN: 44 mL/min — AB (ref >60)
Glucose, Bld: 78 mg/dL (ref 65–99)
Potassium: 3.3 mmol/L — ABNORMAL LOW (ref 3.5–5.1)
SODIUM: 140 mmol/L (ref 135–145)
Total Bilirubin: 0.7 mg/dL (ref 0.3–1.2)
Total Protein: 5.6 g/dL — ABNORMAL LOW (ref 6.5–8.1)

## 2015-11-19 LAB — GLUCOSE, CAPILLARY
Glucose-Capillary: 66 mg/dL (ref 65–99)
Glucose-Capillary: 192 mg/dL — ABNORMAL HIGH (ref 65–99)
Glucose-Capillary: 224 mg/dL — ABNORMAL HIGH (ref 65–99)
Glucose-Capillary: 237 mg/dL — ABNORMAL HIGH (ref 65–99)

## 2015-11-19 LAB — TROPONIN I: Troponin I: <0.03 ng/mL (ref <0.031)

## 2015-11-19 MED ORDER — TRAMADOL HCL 50 MG PO TABS
50.0000 mg | ORAL_TABLET | Freq: Once | ORAL | Status: AC
  Administered 2015-11-19: 50 mg via ORAL
  Filled 2015-11-19: qty 1

## 2015-11-19 MED ORDER — TACROLIMUS 1 MG PO CAPS
5.0000 mg | ORAL_CAPSULE | Freq: Two times a day (BID) | ORAL | Status: DC
  Administered 2015-11-19 – 2015-11-20 (×2): 5 mg via ORAL
  Filled 2015-11-19 (×2): qty 5

## 2015-11-19 NOTE — Progress Notes (Signed)
[facility] PROGRESS NOTE  [name] [id number] DOB: February 03, 1971 DOA: 11/17/2015   PCP: [name], MD     Assessment/Plan: Principal Problem:   Hyperkalemia Active Problems:   Insulin-dependent diabetes mellitus with renal complications ([place])   Anemia in CKD (chronic kidney disease)   History of renal transplant    Cynthia [name] is a 45 y.o. female with a past medical history significant for ESRD previously on home HD, underwent DDKT on 09/2015 at [facility], with some delayed graft failure, also IDDM and HTN who presents with hyperkalemia  Assessment and plan Hyperkalemia-due to delayed graft function and poorly restricted oral intake in the face of ongoing tacrolimus and Bactrim use- Potassium Increasing despite receiving the usual treatment for hyperkalemia, requiring urgent hemodialysis To treat patient's concomitant metabolic acidosis patient started on sodium bicarbonate tablets Potassium now corrected, reassess need for further dialysis and repeat potassium in the morning  Tacrolimus level is pending, dose to be adjusted by nephrology Continue lactulose Currently on Bactrim and acyclovir prophylaxis  Anemia of chronic disease-baseline around 10, 8.3 currently  Hypertension -continue Norvasc, Coreg,  Gastroesophageal reflux disease  Diabetes-insulin-dependent, continue sliding scale insulin and Lantus  DVT prophylaxsis Lovenox  Code Status:  Full code    Family Communication: Discussed in detail with the patient, all imaging results, lab results explained to the patient   Disposition Plan anticipate discharge tomorrow     Consultants:  Nephrology  Procedures:  Hemodialysis  Antibiotics: Anti-infectives    Start     Dose/Rate Route Frequency Ordered Stop   11/18/15 1000  acyclovir (ZOVIRAX) tablet 400 mg     400 mg Oral 2 times daily 11/18/15 0328     11/18/15 1000  sulfamethoxazole-trimethoprim (BACTRIM,SEPTRA) 400-80 MG per tablet  1 tablet     1 tablet Oral Daily 11/18/15 0328           HPI/Subjective:  Atypical chest pain last night, initial troponin negative, EKG unchanged  Objective: Filed Vitals:   11/18/15 2100 11/18/15 2123 11/18/15 2212 11/19/15 0420  BP: 177/66 176/62 193/62 143/75  Pulse: 83 83 85 77  Temp:  98 F (36.7 C) 98.2 F (36.8 C) 98.3 F (36.8 C)  TempSrc:  Oral Oral Oral  Resp: 16 16 18 16   Height:      Weight:      SpO2:   100% 100%    Intake/Output Summary (Last 24 hours) at 11/19/15 1006 Last data filed at 11/19/15 0600  Gross per 24 hour  Intake    240 ml  Output   -500 ml  Net    740 ml    Exam:  Examination:  General exam: Appears calm and comfortable  Respiratory system: Clear to auscultation. Respiratory effort normal. Cardiovascular system: S1 & S2 heard, RRR. No JVD, murmurs, rubs, gallops or clicks. No pedal edema. Gastrointestinal system: Abdomen is nondistended, soft and nontender. No organomegaly or masses felt. Normal bowel sounds heard. Central nervous system: Alert and oriented. No focal neurological deficits. Extremities: Symmetric 5 x 5 power. Skin: No rashes, lesions or ulcers Psychiatry: Judgement and insight appear normal. Mood & affect appropriate.     Data Reviewed: I have personally reviewed following labs and imaging studies  Micro Results No results found for this or any previous visit (from the past 240 hour(s)).  Radiology Reports No results found.   CBC  Recent Labs Lab 11/17/15 2337 11/18/15 1817  WBC 6.9 5.4  HGB 9.3* 8.3*  HCT 29.8*  26.1*  PLT 203 181  MCV 101.4* 99.6  MCH 31.6 31.7  MCHC 31.2 31.8  RDW 19.2* 18.9*  LYMPHSABS 0.9  --   MONOABS 0.4  --   EOSABS 0.0  --   BASOSABS 0.0  --     Chemistries   Recent Labs Lab 11/17/15 2337 11/18/15 0915 11/18/15 1502 11/18/15 2253 11/19/15 0411  NA 138 140 138 134* 140  K 7.3* 6.3* 7.3* 3.2* 3.3*  CL 117* 119* 118* 98* 102  CO2 17* 16* 15* 26 29  GLUCOSE  106* 140* 239* 132* 78  BUN 64* 54* 50* 7 9  CREATININE 3.59* 3.20* 2.95* 1.18* 1.59*  CALCIUM 10.2 10.1 10.4* 8.9 9.1  AST 10*  --   --   --  13*  ALT 13*  --   --   --  14  ALKPHOS 66  --   --   --  63  BILITOT 0.4  --   --   --  0.7   ------------------------------------------------------------------------------------------------------------------ estimated creatinine clearance is 39.9 mL/min (by C-G formula based on Cr of 1.59). ------------------------------------------------------------------------------------------------------------------ No results for input(s): HGBA1C in the last 72 hours. ------------------------------------------------------------------------------------------------------------------ No results for input(s): CHOL, HDL, LDLCALC, TRIG, CHOLHDL, LDLDIRECT in the last 72 hours. ------------------------------------------------------------------------------------------------------------------ No results for input(s): TSH, T4TOTAL, T3FREE, THYROIDAB in the last 72 hours.  Invalid input(s): FREET3 ------------------------------------------------------------------------------------------------------------------ No results for input(s): VITAMINB12, FOLATE, FERRITIN, TIBC, IRON, RETICCTPCT in the last 72 hours.  Coagulation profile No results for input(s): INR, PROTIME in the last 168 hours.  No results for input(s): DDIMER in the last 72 hours.  Cardiac Enzymes  Recent Labs Lab 11/19/15 0649  TROPONINI <0.03   ------------------------------------------------------------------------------------------------------------------ Invalid input(s): POCBNP   CBG:  Recent Labs Lab 11/18/15 0325 11/18/15 0747 11/18/15 1134 11/18/15 2203 11/19/15 0741  GLUCAP 95 115* 202* 127* 66       Studies: No results found.    Lab Results  Component Value Date   HGBA1C 6.6* 01/04/2015   HGBA1C 6.8* 03/29/2014   HGBA1C 5.8* 11/16/2013   Lab Results  Component  Value Date   LDLCALC 74 03/29/2014   CREATININE 1.59* 11/19/2015       Scheduled Meds: . acyclovir  400 mg Oral BID  . amLODipine  10 mg Oral Daily  . aspirin EC  81 mg Oral Daily  . carvedilol  12.5 mg Oral BID  . enoxaparin (LOVENOX) injection  30 mg Subcutaneous Q24H  . FLUoxetine  20 mg Oral Daily  . gabapentin  300 mg Oral QHS  . insulin aspart  0-5 Units Subcutaneous QHS  . insulin aspart  0-9 Units Subcutaneous TID WC  . insulin glargine  11 Units Subcutaneous QHS  . lactulose  20 g Oral TID  . mycophenolate  720 mg Oral BID  . pantoprazole  40 mg Oral Daily  . predniSONE  10 mg Oral Q breakfast  . sodium bicarbonate  650 mg Oral TID  . sulfamethoxazole-trimethoprim  1 tablet Oral Daily  . tacrolimus  3 mg Oral BID   Continuous Infusions:       Time spent: >30 MINS    [facility]  [facility] Pager [phone]. If 7PM-7AM, please contact night-coverage at [email or web address], password [facility] 11/19/2015, 10:06 AM

## 2015-11-19 NOTE — Progress Notes (Signed)
Patient ID: Cynthia [name], female   DOB: May 22, 1971, 45 y.o.   MRN: [id number] [facility] Progress Note   Assessment/ Plan:   1. Hyperkalemia: Secondary to delayed graft function and poorly restricted oral intake in the face of ongoing tacrolimus and Bactrim use. Poor response to medical management yesterday and required hemodialysis for potassium removal. With iatrogenic hypokalemia at this time 2. Status post cadaveric kidney transplant with delayed graft function: Currently chronic kidney disease stage IV T. Prograf dose adjusted to 5 mg twice a day (from 3 mg twice a day) per phone call instructions from Dr. Esaw Dace at [facility]. 3. Hypertension: Currently on amlodipine and carvedilol, She takes clonidine as needed at home.. 4. Anemia: Hemoglobin slightly lower, continue to monitor  Anticipated discharge after ACS ruled out.  Subjective:   Experienced some atypical chest pain earlier this morning-initial troponin not suggestive of ACS. No acute events on telemetry/EKG.    Objective:   BP 143/75 mmHg  Pulse 77  Temp(Src) 98.3 F (36.8 C) (Oral)  Resp 16  Ht 5\' 1"  (1.549 m)  Wt 72.8 kg (160 lb 7.9 oz)  BMI 30.34 kg/m2  SpO2 100%  Intake/Output Summary (Last 24 hours) at 11/19/15 1047 Last data filed at 11/19/15 0600  Gross per 24 hour  Intake    240 ml  Output   -500 ml  Net    740 ml   Weight change: -1.2 kg (-2 lb 10.3 oz)  Physical Exam: [id number] resting in bed CVS: Pulse regular rhythm, 3/6 holosystolic murmur over entire precordium Resp: Clear to auscultation, no rales [id number], obese, no tenderness right lower quadrant Ext: No lower extremity edema, left upper arm BCF with region bruit  Imaging: No results found.  Labs: BMET  Recent Labs Lab 11/17/15 2337 11/18/15 0915 11/18/15 1502 11/18/15 2253 11/19/15 0411  NA 138 140 138 134* 140  K 7.3* 6.3* 7.3* 3.2* 3.3*  CL 117* 119* 118* 98* 102  CO2 17* 16* 15* 26 29  GLUCOSE 106* 140*  239* 132* 78  BUN 64* 54* 50* 7 9  CREATININE 3.59* 3.20* 2.95* 1.18* 1.59*  CALCIUM 10.2 10.1 10.4* 8.9 9.1   CBC  Recent Labs Lab 11/17/15 2337 11/18/15 1817  WBC 6.9 5.4  NEUTROABS 5.6  --   HGB 9.3* 8.3*  HCT 29.8* 26.1*  MCV 101.4* 99.6  PLT 203 181    Medications:    . acyclovir  400 mg Oral BID  . amLODipine  10 mg Oral Daily  . aspirin EC  81 mg Oral Daily  . carvedilol  12.5 mg Oral BID  . enoxaparin (LOVENOX) injection  30 mg Subcutaneous Q24H  . FLUoxetine  20 mg Oral Daily  . gabapentin  300 mg Oral QHS  . insulin aspart  0-5 Units Subcutaneous QHS  . insulin aspart  0-9 Units Subcutaneous TID WC  . insulin glargine  11 Units Subcutaneous QHS  . lactulose  20 g Oral TID  . mycophenolate  720 mg Oral BID  . pantoprazole  40 mg Oral Daily  . predniSONE  10 mg Oral Q breakfast  . sodium bicarbonate  650 mg Oral TID  . sulfamethoxazole-trimethoprim  1 tablet Oral Daily  . tacrolimus  3 mg Oral BID      Elmarie Shiley, MD 11/19/2015, 10:47 AM

## 2015-11-19 NOTE — Progress Notes (Addendum)
Pt c/o abdominal pain 8/10, right chest pain 5/10, and headache 8/10. VSS. Fredirick Maudlin, NP notified. Orders received. Will continue to monitor.

## 2015-11-19 NOTE — Progress Notes (Signed)
[facility] progress note. Chief complaint. Chest pain. History of present illness. This 45 year old female with end-stage renal disease status post renal transplant. She has required intermittent dialysis despite transplant presented with hyperkalemia. Potassium has now normalized at level of 3.3. Complained to nursing of abdominal pain and right chest pain as well as headache. Nursing obtained an EKG which I reviewed initiated normal sinus rhythm without evidence of ischemia. Came to see the patient at bedside and at that point the patient indicated that pain had essentially resolved. On during the pain the intensity was essentially 5/10. There is no associated diaphoresis, nausea, or radiation to the arm or jaw. Physical exam. Vital signs. Temperature 98.3, pulse 77, respiration 16, blood pressure 143/75. O2 sats 100%. General appearance. Well-developed female who is alert and in no distress. Cardiac. Rate and rhythm regular. Lungs. Breath sounds clear. Abdomen. Soft with positive bowel sounds. No pain. Impression/plan. Problem #1. Chest pain. 12-lead EKG indicates normal sinus rhythm without evidence of acute ischemia. Nonetheless I will check troponin now and then every 6 hours for total of 3 sets. We'll repeat EKG in approximately 6 hours to evaluate for any changes.

## 2015-11-19 NOTE — Progress Notes (Signed)
Pt states, "I'm doing better" Only having right chest pressure 3/10 now.

## 2015-11-20 LAB — COMPREHENSIVE METABOLIC PANEL
ALK PHOS: 61 U/L (ref 38–126)
ALT: 13 U/L — ABNORMAL LOW (ref 14–54)
ANION GAP: 11 (ref 5–15)
AST: 17 U/L (ref 15–41)
Albumin: 3.1 g/dL — ABNORMAL LOW (ref 3.5–5.0)
BILIRUBIN TOTAL: 0.7 mg/dL (ref 0.3–1.2)
BUN: 17 mg/dL (ref 6–20)
CALCIUM: 9.3 mg/dL (ref 8.9–10.3)
CO2: 22 mmol/L (ref 22–32)
Chloride: 105 mmol/L (ref 101–111)
Creatinine, Ser: 2.4 mg/dL — ABNORMAL HIGH (ref 0.44–1.00)
GFR, EST AFRICAN AMERICAN: 27 mL/min — AB (ref >60)
GFR, EST NON AFRICAN AMERICAN: 23 mL/min — AB (ref >60)
GLUCOSE: 159 mg/dL — AB (ref 65–99)
Potassium: 4.2 mmol/L (ref 3.5–5.1)
Sodium: 138 mmol/L (ref 135–145)
TOTAL PROTEIN: 5.1 g/dL — AB (ref 6.5–8.1)

## 2015-11-20 LAB — TACROLIMUS LEVEL: Tacrolimus (FK506) - LabCorp: 5.4 ng/mL (ref 2.0–20.0)

## 2015-11-20 LAB — GLUCOSE, CAPILLARY
GLUCOSE-CAPILLARY: 150 mg/dL — AB (ref 65–99)
Glucose-Capillary: 193 mg/dL — ABNORMAL HIGH (ref 65–99)

## 2015-11-20 MED ORDER — LACTULOSE 10 GM/15ML PO SOLN: 20.0000 g | Freq: Three times a day (TID) | ORAL | Status: DC

## 2015-11-20 MED ORDER — TACROLIMUS 5 MG PO CAPS: 5.0000 mg | ORAL_CAPSULE | Freq: Two times a day (BID) | ORAL | Status: DC

## 2015-11-20 MED ORDER — SODIUM BICARBONATE 650 MG PO TABS: 650.0000 mg | ORAL_TABLET | Freq: Three times a day (TID) | ORAL | Status: DC

## 2015-11-20 NOTE — Discharge Summary (Signed)
Physician Discharge Summary  Cynthia [name] MRN: [id number] DOB/AGE: 08-17-1970 45 y.o.  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 Qty: 240 mL, Refills: 0    sodium bicarbonate 650 MG tablet Take 1 tablet (650 mg total) by mouth 3 (three) times daily. Qty: 90 tablet, Refills: 1      CONTINUE these medications which have CHANGED   Details  tacrolimus (PROGRAF) 5 MG capsule Take 1 capsule (5 mg total) by mouth 2 (two) times daily. Qty: 60 capsule, Refills: 1      CONTINUE these medications which have NOT CHANGED   Details  acyclovir (ZOVIRAX) 400 MG tablet Take 400 mg by mouth 2 (two) times daily.    amLODipine (NORVASC) 10 MG tablet Take 10 mg by mouth daily.    aspirin EC 81 MG EC tablet Take 1 tablet (81 mg total) by mouth daily. Qty: 90 tablet, Refills: 3    carvedilol (COREG) 25 MG tablet Take 12.5 mg by mouth 2 (two) times daily.  Refills: 3    cloNIDine (CATAPRES) 0.1 MG tablet Take 2 tablets (0.2 mg total) by mouth 2 (two) times daily. Qty: 10 tablet, Refills: 0    FLUoxetine (PROZAC) 20 MG tablet Take 20 mg by mouth daily.    gabapentin (NEURONTIN) 300 MG capsule Take 300 mg by mouth at bedtime. Refills: 12    Insulin Glargine (LANTUS SOLOSTAR) 100 UNIT/ML Solostar Pen Inject 11 Units into the skin at bedtime.     insulin lispro (HUMALOG) 100 UNIT/ML injection Inject  15 Units into the skin 3 (three) times daily before meals.    mycophenolate (MYFORTIC) 180 MG EC tablet Take 720 mg by mouth 2 (two) times daily.    ondansetron (ZOFRAN) 4 MG tablet Take 4 mg by mouth every 8 (eight) hours as needed for nausea or vomiting.    pantoprazole (PROTONIX) 40 MG tablet Take 40 mg by mouth daily. Refills: 5    predniSONE (DELTASONE) 10 MG tablet Take 10 mg by mouth daily with breakfast.    senna (SENOKOT) 8.6 MG TABS tablet Take 1 tablet by mouth daily.    sulfamethoxazole-trimethoprim (BACTRIM,SEPTRA) 400-80 MG tablet Take 1 tablet by mouth daily.         Discharge Condition:  Stable  Discharge Instructions Get Medicines reviewed and adjusted: Please take all your medications with you for your next visit with your Primary MD  Please request your Primary MD to go over all hospital tests and procedure/radiological results at the follow up, please ask your Primary MD to get all Hospital records sent to his/her office.  If you experience worsening of your admission symptoms, develop shortness of breath, life threatening emergency, suicidal or homicidal thoughts you must seek medical attention immediately by calling 911 or calling your MD immediately if symptoms less severe.  You must read complete instructions/literature along with all the possible adverse reactions/side effects  for all the Medicines you take and that have been prescribed to you. Take any new Medicines after you have completely understood and accpet all the possible adverse reactions/side effects.   Do not drive when taking Pain medications.   Do not take more than prescribed Pain, Sleep and Anxiety Medications  Special Instructions: If you have smoked or chewed Tobacco in the last 2 yrs please stop smoking, stop any regular Alcohol and or any Recreational drug use.  Wear Seat belts while driving.  Please note  You were cared for by a hospitalist during your hospital stay. Once you  are discharged, your primary care physician will handle any further medical issues. Please note that NO REFILLS for any discharge medications will be authorized once you are discharged, as it is imperative that you return to your primary care physician (or establish a relationship with a primary care physician if you do not have one) for your aftercare needs so that they can reassess your need for medications and monitor your lab values.  Discharge Instructions    Diet - low sodium heart healthy    Complete by:  As directed      Increase activity slowly    Complete by:  As directed             Allergies  Allergen Reactions  . Tape Other (See Comments)    Burns skin.  Please use paper tape only      Disposition: 01-Home or Self Care   Consults: * Nephrology      No results found.   Filed Weights   11/18/15 0343 11/18/15 1705 11/19/15 2203  Weight: 74 kg (163 lb 2.3 oz) 72.8 kg (160 lb 7.9 oz) 71.487 kg (157 lb 9.6 oz)     Microbiology: No results found for this or any previous visit (from the past 240 hour(s)).     Blood Culture    Component Value Date/Time   SDES URINE, RANDOM 05/15/2013 1100   SPECREQUEST NONE 05/15/2013 1100   CULT NO GROWTH Performed at [facility] 05/15/2013 1100   REPTSTATUS 05/16/2013 FINAL 05/15/2013 1100      Labs: Results for orders placed or performed during the hospital encounter of 11/17/15 (from the past 48 hour(s))  Glucose, capillary     Status: Abnormal   Collection Time: 11/18/15 11:34 AM  Result Value Ref Range   Glucose-Capillary 202 (H) 65 - 99 mg/dL  Basic metabolic panel     Status: Abnormal   Collection Time: 11/18/15  3:02 PM  Result Value Ref Range   Sodium 138 135 - 145 mmol/L   Potassium 7.3 (HH) 3.5 - 5.1 mmol/L    Comment: NO VISIBLE HEMOLYSIS CRITICAL RESULT CALLED TO, READ BACK BY AND VERIFIED WITH: JAY NARANDAS,RN AT 1558 11/18/15 BY ZBEECH.    Chloride 118 (H) 101 - 111 mmol/L   CO2 15 (L)  22 - 32 mmol/L   Glucose, Bld 239 (H) 65 - 99 mg/dL   BUN 50 (H) 6 - 20 mg/dL   Creatinine, Ser 2.95 (H) 0.44 - 1.00 mg/dL   Calcium 10.4 (H) 8.9 - 10.3 mg/dL   GFR calc non Af Amer 18 (L) >60 mL/min   GFR calc Af Amer 21 (L) >60 mL/min    Comment: (NOTE) The eGFR has been calculated using the CKD EPI equation. This calculation has not been validated in all clinical situations. eGFR's persistently <60 mL/min signify possible Chronic Kidney Disease.    Anion gap 5  5 - 15  CBC     Status: Abnormal   Collection Time: 11/18/15  6:17 PM  Result Value Ref Range   WBC 5.4 4.0 - 10.5 K/uL   RBC 2.62 (L) 3.87 - 5.11 MIL/uL   Hemoglobin 8.3 (L) 12.0 - 15.0 g/dL   HCT 26.1 (L) 36.0 - 46.0 %   MCV 99.6 78.0 - 100.0 fL   MCH 31.7 26.0 - 34.0 pg   MCHC 31.8 30.0 - 36.0 g/dL   RDW 18.9 (H) 11.5 - 15.5 %   Platelets 181 150 - 400 K/uL  Glucose, capillary     Status: Abnormal   Collection Time: 11/18/15 10:03 PM  Result Value Ref Range   Glucose-Capillary 127 (H) 65 - 99 mg/dL  Basic metabolic panel     Status: Abnormal   Collection Time: 11/18/15 10:53 PM  Result Value Ref Range   Sodium 134 (L) 135 - 145 mmol/L   Potassium 3.2 (L) 3.5 - 5.1 mmol/L    Comment: DELTA CHECK NOTED   Chloride 98 (L) 101 - 111 mmol/L   CO2 26 22 - 32 mmol/L   Glucose, Bld 132 (H) 65 - 99 mg/dL   BUN 7 6 - 20 mg/dL   Creatinine, Ser 1.18 (H) 0.44 - 1.00 mg/dL    Comment: DELTA CHECK NOTED DIALYSIS    Calcium 8.9 8.9 - 10.3 mg/dL   GFR calc non Af Amer 54 (L) >60 mL/min   GFR calc Af Amer >60 >60 mL/min    Comment: (NOTE) The eGFR has been calculated using the CKD EPI equation. This calculation has not been validated in all clinical situations. eGFR's persistently <60 mL/min signify possible Chronic Kidney Disease.    Anion gap 10 5 - 15  Comprehensive metabolic panel     Status: Abnormal   Collection Time: 11/19/15  4:11 AM  Result Value Ref Range   Sodium 140 135 - 145 mmol/L   Potassium 3.3 (L)  3.5 - 5.1 mmol/L   Chloride 102 101 - 111 mmol/L   CO2 29 22 - 32 mmol/L   Glucose, Bld 78 65 - 99 mg/dL   BUN 9 6 - 20 mg/dL   Creatinine, Ser 1.59 (H) 0.44 - 1.00 mg/dL   Calcium 9.1 8.9 - 10.3 mg/dL   Total Protein 5.6 (L) 6.5 - 8.1 g/dL   Albumin 3.3 (L) 3.5 - 5.0 g/dL   AST 13 (L) 15 - 41 U/L   ALT 14 14 - 54 U/L   Alkaline Phosphatase 63 38 - 126 U/L   Total Bilirubin 0.7 0.3 - 1.2 mg/dL   GFR calc non Af Amer 38 (L) >60 mL/min   GFR calc Af Amer 44 (L) >60 mL/min    Comment: (NOTE) The eGFR has been calculated using the CKD EPI equation. This calculation has not been validated in all clinical situations. eGFR's persistently <60 mL/min signify possible Chronic Kidney Disease.    Anion gap 9 5 - 15  Troponin I (q 6hr x 3)     Status: None   Collection Time: 11/19/15  6:49 AM  Result Value Ref Range   Troponin I <0.03 <0.031 ng/mL    Comment:        NO INDICATION OF MYOCARDIAL INJURY.   Glucose, capillary     Status: None   Collection Time: 11/19/15  7:41 AM  Result Value Ref Range   Glucose-Capillary 66 65 - 99 mg/dL   Comment 1 Notify RN  Comment 2 Document in Chart   Glucose, capillary     Status: Abnormal   Collection Time: 11/19/15 11:57 AM  Result Value Ref Range   Glucose-Capillary 192 (H) 65 - 99 mg/dL  Troponin I (q 6hr x 3)     Status: None   Collection Time: 11/19/15 12:17 PM  Result Value Ref Range   Troponin I <0.03 <0.031 ng/mL    Comment:        NO INDICATION OF MYOCARDIAL INJURY.   Glucose, capillary     Status: Abnormal   Collection Time: 11/19/15  4:54 PM  Result Value Ref Range   Glucose-Capillary 224 (H) 65 - 99 mg/dL  Troponin I (q 6hr x 3)     Status: None   Collection Time: 11/19/15  6:08 PM  Result Value Ref Range   Troponin I <0.03 <0.031 ng/mL    Comment:        NO INDICATION OF MYOCARDIAL INJURY.   Glucose, capillary     Status: Abnormal   Collection Time: 11/19/15  7:52 PM  Result Value Ref Range   Glucose-Capillary 237  (H) 65 - 99 mg/dL  Comprehensive metabolic panel     Status: Abnormal   Collection Time: 11/20/15  5:09 AM  Result Value Ref Range   Sodium 138 135 - 145 mmol/L   Potassium 4.2 3.5 - 5.1 mmol/L    Comment: DELTA CHECK NOTED   Chloride 105 101 - 111 mmol/L   CO2 22 22 - 32 mmol/L   Glucose, Bld 159 (H) 65 - 99 mg/dL   BUN 17 6 - 20 mg/dL   Creatinine, Ser 2.40 (H) 0.44 - 1.00 mg/dL   Calcium 9.3 8.9 - 10.3 mg/dL   Total Protein 5.1 (L) 6.5 - 8.1 g/dL   Albumin 3.1 (L) 3.5 - 5.0 g/dL   AST 17 15 - 41 U/L   ALT 13 (L) 14 - 54 U/L   Alkaline Phosphatase 61 38 - 126 U/L   Total Bilirubin 0.7 0.3 - 1.2 mg/dL   GFR calc non Af Amer 23 (L) >60 mL/min   GFR calc Af Amer 27 (L) >60 mL/min    Comment: (NOTE) The eGFR has been calculated using the CKD EPI equation. This calculation has not been validated in all clinical situations. eGFR's persistently <60 mL/min signify possible Chronic Kidney Disease.    Anion gap 11 5 - 15  Glucose, capillary     Status: Abnormal   Collection Time: 11/20/15  8:36 AM  Result Value Ref Range   Glucose-Capillary 150 (H) 65 - 99 mg/dL     Lipid Panel     Component Value Date/Time   CHOL 162 03/29/2014 0616   TRIG 284* 03/29/2014 0616   HDL 31* 03/29/2014 0616   CHOLHDL 5.2 03/29/2014 0616   VLDL 57* 03/29/2014 0616   LDLCALC 74 03/29/2014 0616     Lab Results  Component Value Date   HGBA1C 6.6* 01/04/2015   HGBA1C 6.8* 03/29/2014   HGBA1C 5.8* 11/16/2013     Lab Results  Component Value Date   LDLCALC 74 03/29/2014   CREATININE 2.40* 11/20/2015     HPI  45 year old woman of Hispanic origin with history of end-stage renal disease status post cadaveric kidney transplant at [facility] on October 14, 2015 with delayed graft function and renal biopsy showing ATN. She has been intermittently followed with labs for posttransplant management. It appears that her last dialysis was about 3 weeks ago. She was directed  to come to the emergency room with  hyperkalemia of 7.3 with concomitant EKG changes-overnight management has resulted in potassium decreased to 6.3 this morning. She admits to eating some mashed potatoes but otherwise no obvious potassium rich foods. She reports increasing urine output and denies any chest pain or shortness of breath. She denies any NSAIDs  HOSPITAL COURSE:   Hyperkalemia-due to delayed graft function and poorly restricted oral intake in the face of ongoing tacrolimus and Bactrim use- Potassium Increasing despite receiving the usual treatment for hyperkalemia, requiring urgent hemodialysis on 5/26 To treat patient's concomitant metabolic acidosis patient started on sodium bicarbonate tablets Potassium now corrected,  Tacrolimus level is pending, dose  adjusted by nephrology to 5 mg BID  Continue lactulose Currently on Bactrim and acyclovir prophylaxis Potassium 4.2 and creatinine 2.4 prior to discharge  Anemia of chronic disease-baseline around 10, 8.3 currently  Hypertension -continue Norvasc, Coreg,  Gastroesophageal reflux disease  Diabetes-insulin-dependent, continue sliding scale insulin and Lantus   Discharge Exam:    Blood pressure 157/59, pulse 80, temperature 98.3 F (36.8 C), temperature source Oral, resp. rate 20, height _0  (1.549 m), weight 71.487 kg (157 lb 9.6 oz), SpO2 98 %.  General exam: Appears calm and comfortable  Respiratory system: Clear to auscultation. Respiratory effort normal. Cardiovascular system: S1 & S2 heard, RRR. No JVD, murmurs, rubs, gallops or clicks. No pedal edema. Gastrointestinal system: Abdomen is nondistended, soft and nontender. No organomegaly or masses felt. Normal bowel sounds heard. Central nervous system: Alert and oriented. No focal neurological deficits. Extremities: Symmetric 5 x 5 power. Skin: No rashes, lesions or ulcers Psychiatry: Judgement and insight appear normal. Mood & affect appropriate      Signed: Travia Onstad 11/20/2015, 11:26 AM         Time spent >45 mins

## 2015-12-14 IMAGING — RF DG HUMERUS 2V *L*
1 series · 7 of 7 positions shown · non-contrast
Comparison: None.

CLINICAL DATA: Shuntogram.

EXAM:
DG C-ARM 61-120 MIN; LEFT HUMERUS - 2+ VIEW

[Series 1: run · 7 of 7 slices shown]
[im 1/7]
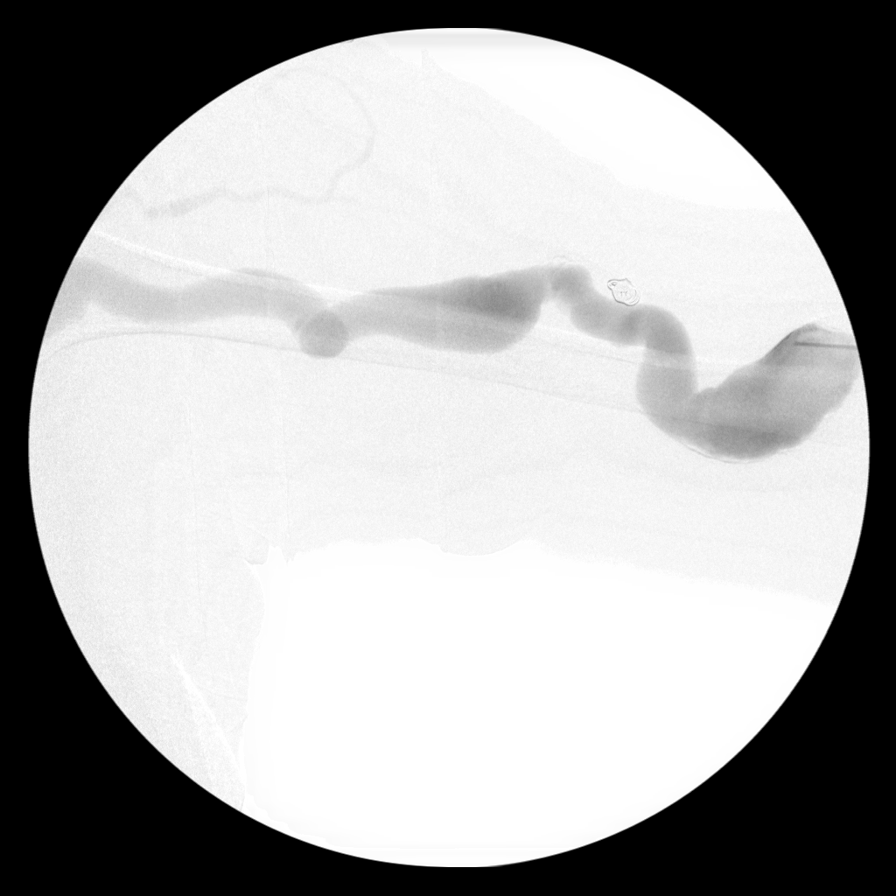
[im 2/7]
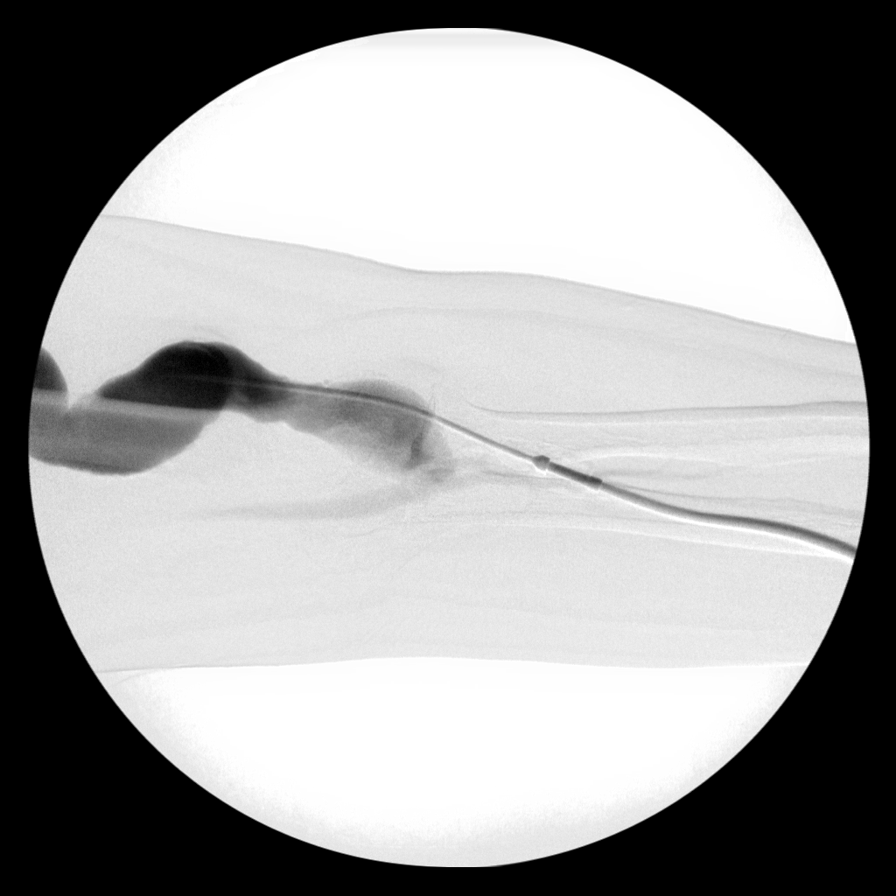
[im 3/7]
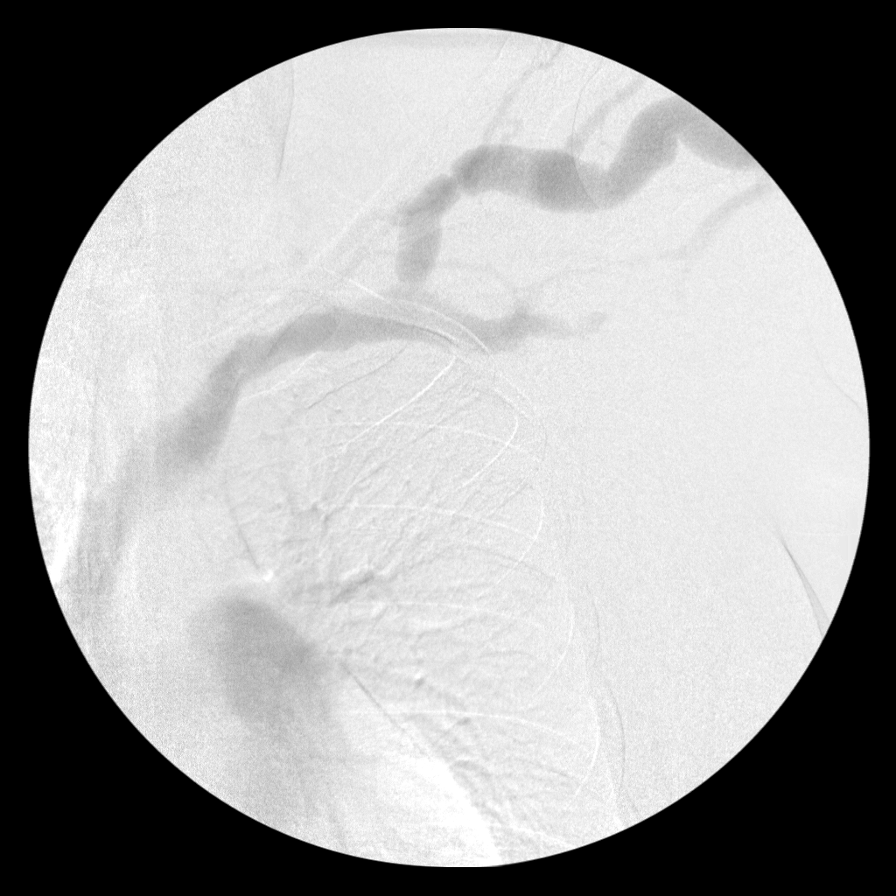
[im 4/7]
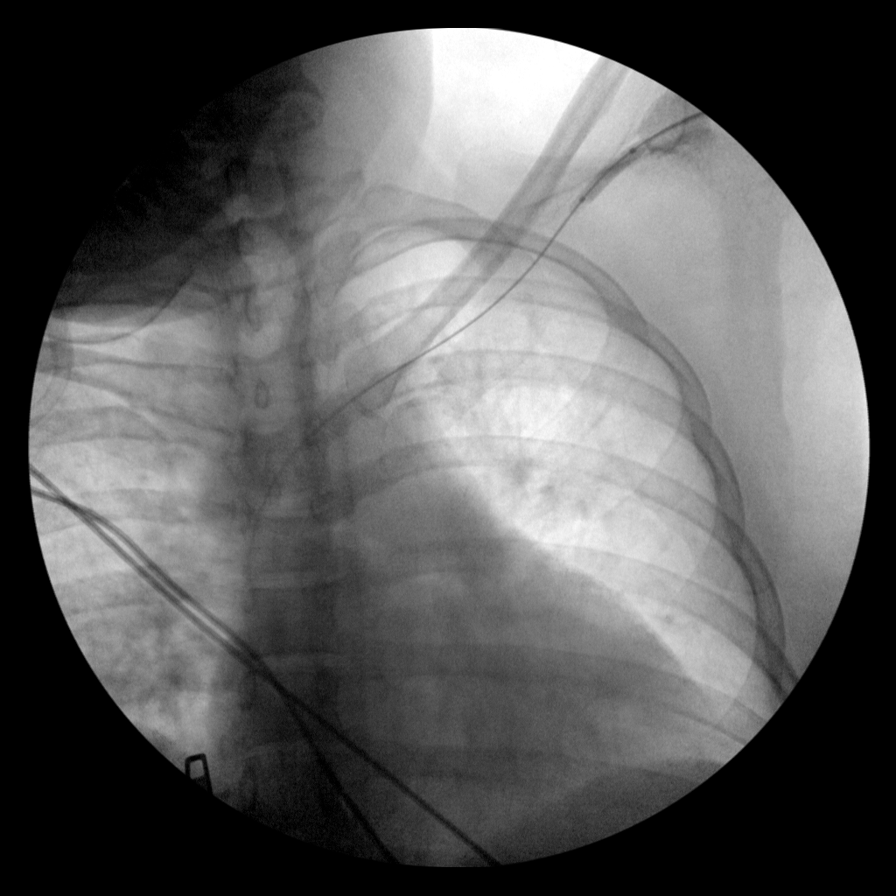
[im 5/7]
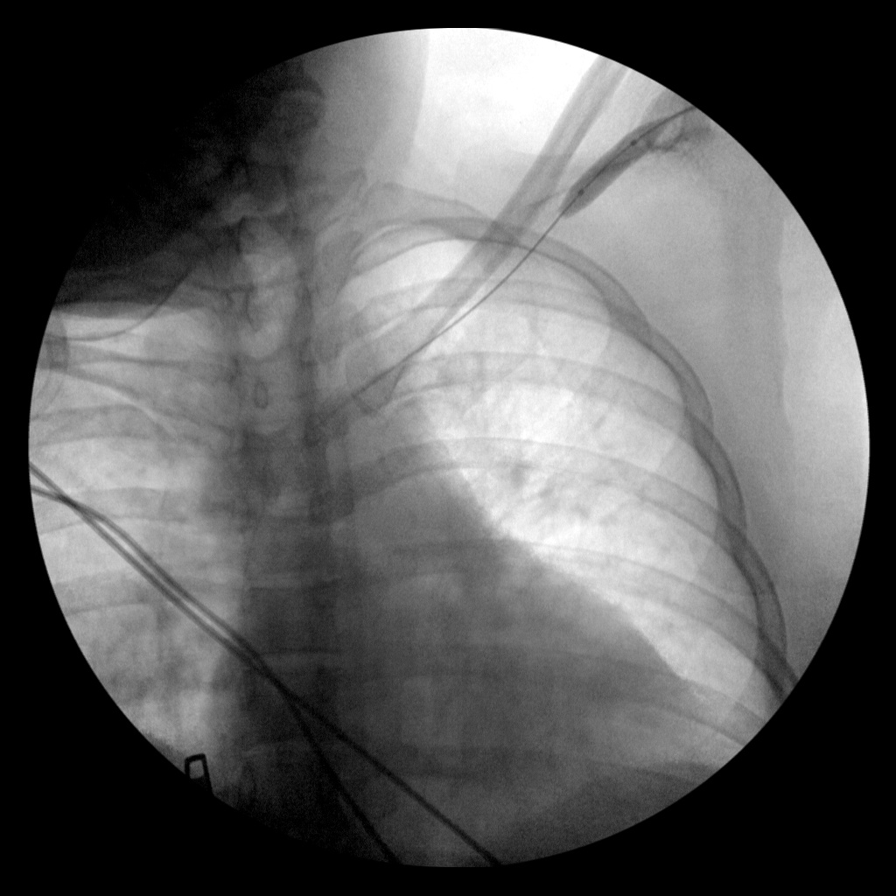
[im 6/7]
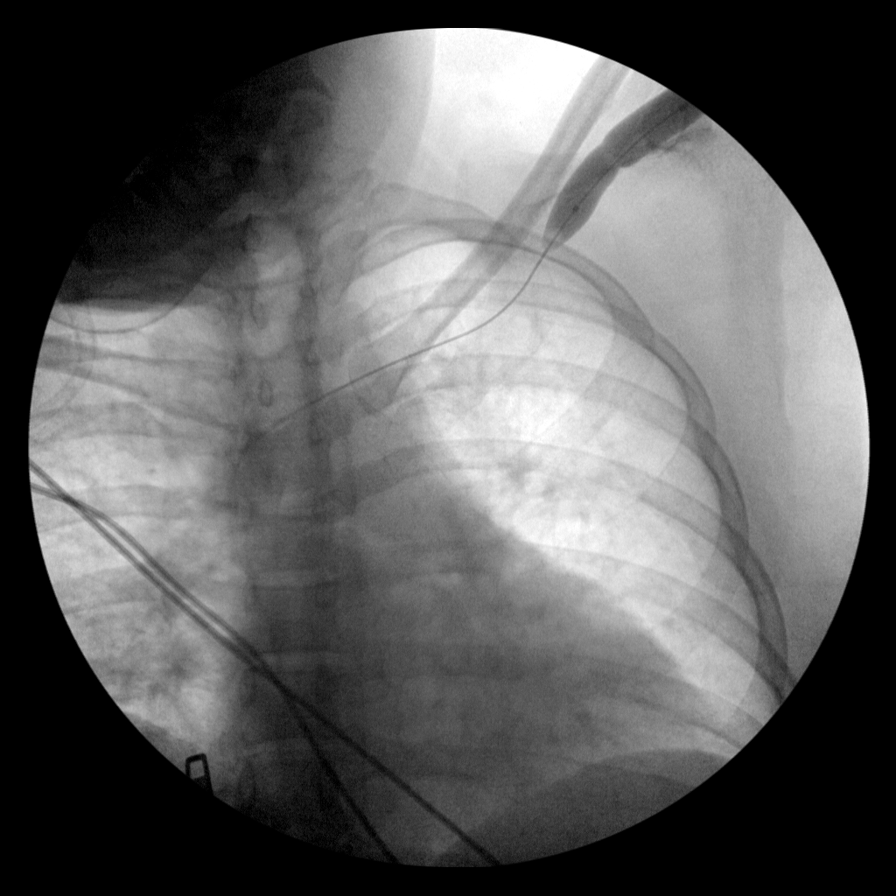
[im 7/7]
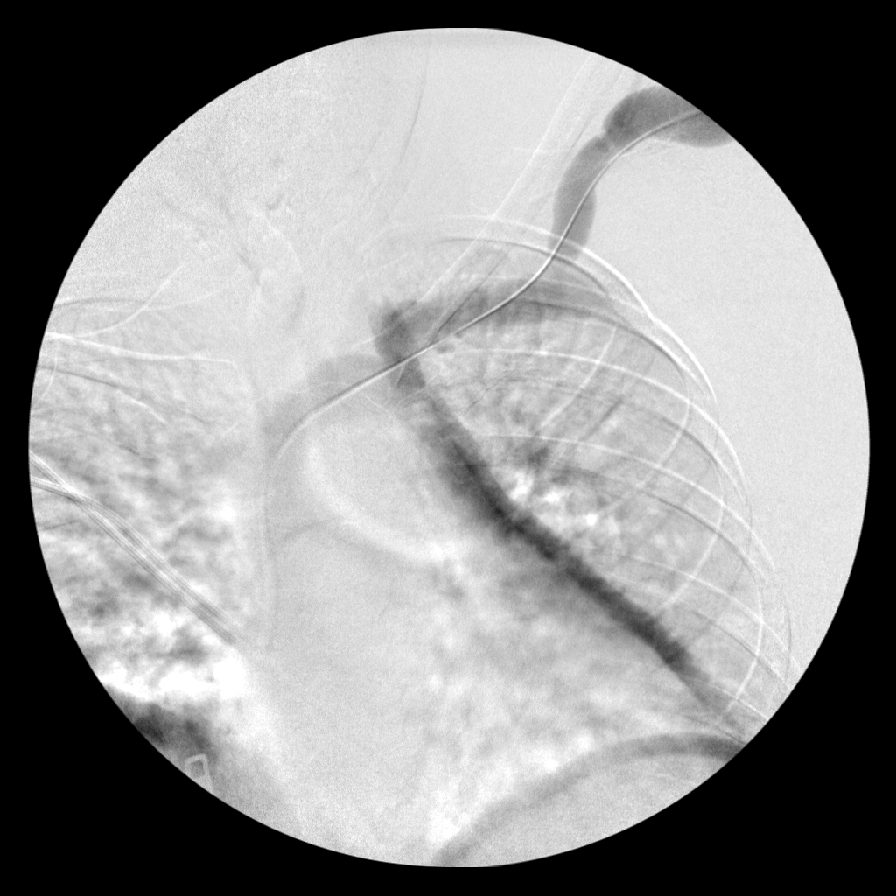

[7 of 7 positions shown; findings below may reference images not displayed]

FINDINGS: Multiple images are submitted from a left upper arm fistulogram.
These demonstrate mild focal stenosis within the central cephalic
vein. Imaging demonstrates balloon dilatation of this area with
improved appearance post dilatation. Slight residual stenosis common
likely not hemodynamically significant.
IMPRESSION: Left upper extremity av fistulagram as above.

## 2016-09-20 IMAGING — NM NM MISC PROCEDURE
6 series · 36 of 36 positions shown · non-contrast
Comparison: none

[Series 1: wbr_r-proj_st wbr rest · 6.40mm/px · 6 of 64 frames shown]
[frame 6/64]
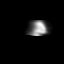
[frame 16/64]
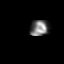
[frame 27/64]
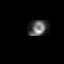
[frame 38/64]
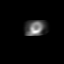
[frame 48/64]
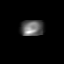
[frame 59/64]
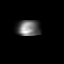

[Series 1: wbr rest · 6.40mm/px · 6 of 64 frames shown]
[frame 6/64]
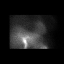
[frame 16/64]
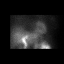
[frame 27/64]
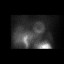
[frame 38/64]
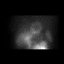
[frame 48/64]
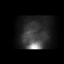
[frame 59/64]
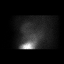

[Series 2: wbr_s-proj_st wbr stress-gsp · 6.40mm/px · 6 of 503 frames shown]
[frame 42/503]
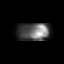
[frame 126/503]
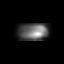
[frame 210/503]
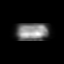
[frame 294/503]
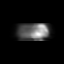
[frame 378/503]
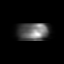
[frame 462/503]
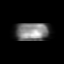

[Series 2: wbr stress-gsp · 6.40mm/px · 6 of 512 frames shown]
[frame 43/512  full-range]
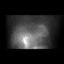
[frame 128/512  full-range]
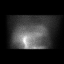
[frame 214/512  full-range]
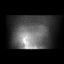
[frame 299/512  full-range]
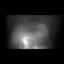
[frame 384/512  full-range]
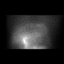
[frame 470/512  full-range]
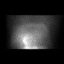

[Series 3: wbr stress-sum-em · 6.40mm/px · 6 of 64 frames shown]
[frame 6/64]
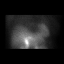
[frame 16/64]
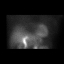
[frame 27/64]
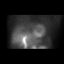
[frame 38/64]
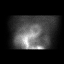
[frame 48/64]
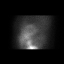
[frame 59/64]
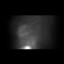

[Series 3: wbr_s-proj_st wbr stress-sum-em · 6.40mm/px · 6 of 63 frames shown]
[frame 6/63]
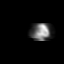
[frame 16/63]
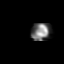
[frame 27/63]
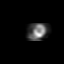
[frame 37/63]
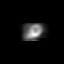
[frame 48/63]
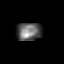
[frame 58/63]
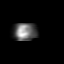

[36 of 36 positions shown; findings below may reference images not displayed]

Canned report from images found in remote index.

Refer to host system for actual result text.

## 2016-11-20 ENCOUNTER — Other Ambulatory Visit: Payer: Self-pay | Admitting: Family Medicine

## 2016-11-20 DIAGNOSIS — N644 Mastodynia: Secondary | ICD-10-CM

## 2016-11-27 ENCOUNTER — Other Ambulatory Visit: Payer: Medicare Other

## 2016-12-11 ENCOUNTER — Ambulatory Visit
Admission: RE | Admit: 2016-12-11 | Discharge: 2016-12-11 | Disposition: A | Discharge status: Home / Self Care | Payer: Medicare Other | Source: Ambulatory Visit | Attending: Family Medicine | Admitting: Family Medicine

## 2016-12-11 DIAGNOSIS — N644 Mastodynia: Secondary | ICD-10-CM

## 2016-12-25 ENCOUNTER — Other Ambulatory Visit: Payer: Self-pay | Admitting: Surgery

## 2017-02-02 NOTE — Progress Notes (Signed)
Received return phone call from Bethena Roys at language services [phone]) confirming Spanish interpreter for PAT appt on 02/04/17.  Also requested Spanish interpreter DOS 02/06/17 at 8am, pt's arrival time.

## 2017-02-02 NOTE — Pre-Procedure Instructions (Signed)
Lorrene Graef  02/02/2017      [id number] - [address], [address] AT [facility] [address] [place] [address] Phone: [phone] Fax: [phone]  [facility] [place], [place] - [address], [address] [place] [address] Phone: [phone] Fax: [phone]    Your procedure is scheduled on February 06, 2017.  Report to [facility] Admitting at 8:00 A.M.  Call this number if you have problems the morning of surgery:  [phone]   Remember:  Do not eat food or drink liquids after midnight.   Take these medicines the morning of surgery with A SIP OF WATER: Acyclovir (Zovirax), Amlodipine (Norvasc), Carvedilol (Coreg), Cetirizine (Zyrtec), Clonidine (Catapres), Mycophenolate (Myfortic), Pantoprazole (Protonix), Prednisone (Deltasone), Tacrolimus (Prograf)  7 days prior to surgery STOP taking any Aspirin, Aleve, Naproxen, Ibuprofen, Motrin, Advil, Goody's, BC's, all herbal medications, fish oil, and all vitamins.    Do not wear jewelry, make-up or nail polish.  Do not wear lotions, powders, or perfumes, or deoderant.  Do not shave 48 hours prior to surgery.    Do not bring valuables to the hospital.   [facility] is not responsible for any belongings or valuables.  Contacts, dentures or bridgework may not be worn into surgery.  Leave your suitcase in the car.  After surgery it may be brought to your room.  For patients admitted to the hospital, discharge time will be determined by your treatment team.  Patients discharged the day of surgery will not be allowed to drive home.     How to Manage Your Diabetes Before and After Surgery  Why is it important to control my blood sugar before and after surgery? . Improving blood sugar levels before and after surgery helps healing and can limit problems. . A way of improving blood sugar control is  eating a healthy diet by: o  Eating less sugar and carbohydrates o  Increasing activity/exercise o  Talking with your doctor about reaching your blood sugar goals . High blood sugars (greater than 180 mg/dL) can raise your risk of infections and slow your recovery, so you will need to focus on controlling your diabetes during the weeks before surgery. . Make sure that the doctor who takes care of your diabetes knows about your planned surgery including the date and location.  How do I manage my blood sugar before surgery? . Check your blood sugar at least 4 times a day, starting 2 days before surgery, to make sure that the level is not too high or low. o Check your blood sugar the morning of your surgery when you wake up and every 2 hours until you get to the Short Stay unit. . If your blood sugar is less than 70 mg/dL, you will need to treat for low blood sugar: o Do not take insulin. o Treat a low blood sugar (less than 70 mg/dL) with  cup of clear juice (cranberry or apple), 4 glucose tablets, OR glucose gel. o Recheck blood sugar in 15 minutes after treatment (to make sure it is greater than 70 mg/dL). If your blood sugar is not greater than 70 mg/dL on recheck, call [phone] for further instructions. . Report your blood sugar to the short stay nurse when you get to Short Stay.  . If you are admitted to the hospital after surgery: o Your blood sugar will be checked  by the staff and you will probably be given insulin after surgery (instead of oral diabetes medicines) to make sure you have good blood sugar levels. o The goal for blood sugar control after surgery is 80-180 mg/dL.       WHAT DO I DO ABOUT MY DIABETES MEDICATION?   Marland Kitchen Do not take oral diabetes medicines (pills) the morning of surgery.  . THE NIGHT BEFORE SURGERY, take 9 units of Lantus insulin.   . The day of surgery, do not take other diabetes injectables, including Byetta (exenatide), Bydureon (exenatide ER),  Victoza (liraglutide), or Trulicity (dulaglutide).  . If your CBG is greater than 220 mg/dL, you may take  of your sliding scale (correction) dose of insulin.    Patient Signature:  Date:   Nurse Signature:  Date:       Special instructions:   [facility]- Preparing For Surgery  Before surgery, you can play an important role. Because skin is not sterile, your skin needs to be as free of germs as possible. You can reduce the number of germs on your skin by washing with CHG (chlorahexidine gluconate) Soap before surgery.  CHG is an antiseptic cleaner which kills germs and bonds with the skin to continue killing germs even after washing.  Please do not use if you have an allergy to CHG or antibacterial soaps. If your skin becomes reddened/irritated stop using the CHG.  Do not shave (including legs and underarms) for at least 48 hours prior to first CHG shower. It is OK to shave your face.  Please follow these instructions carefully.   1. Shower the NIGHT BEFORE SURGERY and the MORNING OF SURGERY with CHG.   2. If you chose to wash your hair, wash your hair first as usual with your normal shampoo.  3. After you shampoo, rinse your hair and body thoroughly to remove the shampoo.  4. Use CHG as you would any other liquid soap. You can apply CHG directly to the skin and wash gently with a scrungie or a clean washcloth.   5. Apply the CHG Soap to your body ONLY FROM THE NECK DOWN.  Do not use on open wounds or open sores. Avoid contact with your eyes, ears, mouth and genitals (private parts). Wash genitals (private parts) with your normal soap.  6. Wash thoroughly, paying special attention to the area where your surgery will be performed.  7. Thoroughly rinse your body with warm water from the neck down.  8. DO NOT shower/wash with your normal soap after using and rinsing off the CHG Soap.  9. Pat yourself dry with a CLEAN TOWEL.   10. Wear CLEAN PAJAMAS   11. Place CLEAN SHEETS  on your bed the night of your first shower and DO NOT SLEEP WITH PETS.   Day of Surgery: Do not apply any deodorants/lotions. Please wear clean clothes to the hospital/surgery center.      Please read over the following fact sheets that you were given. Pain Booklet, Coughing and Deep Breathing and Surgical Site Infection Prevention

## 2017-02-02 NOTE — Progress Notes (Signed)
Unable to view if interpreter was scheduled for pt's PAT appt.  Called and spoke with Bethena Roys at language services to request Spanish interpreter for Feb 04, 2017 10:15 PAT appt.

## 2017-02-04 ENCOUNTER — Encounter (HOSPITAL_COMMUNITY)
Admission: RE | Admit: 2017-02-04 | Discharge: 2017-02-04 | Disposition: A | Discharge status: Home / Self Care | Payer: Medicare Other | Source: Ambulatory Visit | Attending: Surgery | Admitting: Surgery

## 2017-02-04 ENCOUNTER — Encounter (HOSPITAL_COMMUNITY): Payer: Self-pay

## 2017-02-04 DIAGNOSIS — R221 Localized swelling, mass and lump, neck: Secondary | ICD-10-CM | POA: Diagnosis not present

## 2017-02-04 DIAGNOSIS — Z01818 Encounter for other preprocedural examination: Secondary | ICD-10-CM | POA: Diagnosis present

## 2017-02-04 LAB — CBC
HEMATOCRIT: 40.7 % (ref 36.0–46.0)
Hemoglobin: 14.1 g/dL (ref 12.0–15.0)
MCH: 30.4 pg (ref 26.0–34.0)
MCHC: 34.6 g/dL (ref 30.0–36.0)
MCV: 87.7 fL (ref 78.0–100.0)
Platelets: 154 10*3/uL (ref 150–400)
RBC: 4.64 MIL/uL (ref 3.87–5.11)
RDW: 12.7 % (ref 11.5–15.5)
WBC: 5.6 10*3/uL (ref 4.0–10.5)

## 2017-02-04 LAB — BASIC METABOLIC PANEL
ANION GAP: 5 (ref 5–15)
BUN: 23 mg/dL — AB (ref 6–20)
CALCIUM: 10.1 mg/dL (ref 8.9–10.3)
CO2: 27 mmol/L (ref 22–32)
CREATININE: 1.27 mg/dL — AB (ref 0.44–1.00)
Chloride: 106 mmol/L (ref 101–111)
GFR calc Af Amer: 57 mL/min — ABNORMAL LOW (ref >60)
GFR, EST NON AFRICAN AMERICAN: 49 mL/min — AB (ref >60)
Glucose, Bld: 110 mg/dL — ABNORMAL HIGH (ref 65–99)
Potassium: 4.2 mmol/L (ref 3.5–5.1)
Sodium: 138 mmol/L (ref 135–145)

## 2017-02-04 LAB — GLUCOSE, CAPILLARY: GLUCOSE-CAPILLARY: 221 mg/dL — AB (ref 65–99)

## 2017-02-04 NOTE — Pre-Procedure Instructions (Addendum)
Cynthia [name]  02/04/2017      Cynthia [name] [id number] - Cynthia [name], [address] AT [facility] OF [place] [address] Phone: [phone] Fax: [phone]  Cynthia [name] [place], [place] - [address], [address] [place] [address] Phone: [phone] Fax: [phone]    Your procedure is scheduled on February 06, 2017.  Report to [facility] Admitting at 8:00 A.M.  Call this number if you have problems the morning of surgery:  [phone]   Remember:  Do not eat food or drink liquids after midnight.   Take these medicines the morning of surgery with A SIP OF WATER: Acyclovir (Zovirax), Amlodipine (Norvasc), Carvedilol (Coreg),  Clonidine (Catapres), Mycophenolate (Myfortic), Pantoprazole (Protonix), Prednisone (Deltasone), Tacrolimus (Prograf)  7 days prior to surgery STOP taking any Aspirin, Aleve, Naproxen, Ibuprofen, Motrin, Advil, Goody's, BC's, all herbal medications, fish oil, and all vitamins.    Do not wear jewelry, make-up or nail polish.  Do not wear lotions, powders, or perfumes, or deoderant.  Do not shave 48 hours prior to surgery.    Do not bring valuables to the [name].   [facility] is not responsible for any belongings or valuables.  Contacts, dentures or bridgework may not be worn into surgery.  Leave your suitcase in the car.  After surgery it may be brought to your room.  For patients admitted to the [name], discharge time will be determined by your treatment team.  Patients discharged the day of surgery will not be allowed to drive home.    How to Manage Your Diabetes Before and After Surgery  Why is it important to control my blood sugar before and after surgery? . Improving blood sugar levels before and after surgery helps healing and can limit problems. . A way of improving blood sugar control is eating a healthy diet  by: o  Eating less sugar and carbohydrates o  Increasing activity/exercise o  Talking with your doctor about reaching your blood sugar goals . High blood sugars (greater than 180 mg/dL) can raise your risk of infections and slow your recovery, so you will need to focus on controlling your diabetes during the weeks before surgery. . Make sure that the doctor who takes care of your diabetes knows about your planned surgery including the date and location.  How do I manage my blood sugar before surgery? . Check your blood sugar at least 4 times a day, starting 2 days before surgery, to make sure that the level is not too high or low. o Check your blood sugar the morning of your surgery when you wake up and every 2 hours until you get to the Short Stay unit. . If your blood sugar is less than 70 mg/dL, you will need to treat for low blood sugar: o Do not take insulin. o Treat a low blood sugar (less than 70 mg/dL) with  cup of clear juice (cranberry or apple), 4 glucose tablets, OR glucose gel. o Recheck blood sugar in 15 minutes after treatment (to make sure it is greater than 70 mg/dL). If your blood sugar is not greater than 70 mg/dL on recheck, call [phone] for further instructions. . Report your blood sugar to the short stay nurse when you get to Short Stay.  . If you are admitted to the [name] after surgery: o Your blood sugar will be checked by the  staff and you will probably be given insulin after surgery (instead of oral diabetes medicines) to make sure you have good blood sugar levels. o The goal for blood sugar control after surgery is 80-180 mg/dL.   WHAT DO I DO ABOUT MY DIABETES MEDICATION?   Marland Kitchen Do not take oral diabetes medicines (pills) the morning of surgery.  . THE NIGHT BEFORE SURGERY, take 12 units of Lantus insulin    May take meal doses of humalog but do not take bedtime dose .  Marland Kitchen Day of surgery no humalog insulin except as instructed below .   If your CBG is  greater than 220 mg/dL, you may take  of your sliding scale (correction) dose of insulin.   Special instructions:   Cynthia [name]- Preparing For Surgery  Before surgery, you can play an important role. Because skin is not sterile, your skin needs to be as free of germs as possible. You can reduce the number of germs on your skin by washing with CHG (chlorahexidine gluconate) Soap before surgery.  CHG is an antiseptic cleaner which kills germs and bonds with the skin to continue killing germs even after washing.  Please do not use if you have an allergy to CHG or antibacterial soaps. If your skin becomes reddened/irritated stop using the CHG.  Do not shave (including legs and underarms) for at least 48 hours prior to first CHG shower. It is OK to shave your face.  Please follow these instructions carefully.   1. Shower the NIGHT BEFORE SURGERY and the MORNING OF SURGERY with CHG.   2. If you chose to wash your hair, wash your hair first as usual with your normal shampoo.  3. After you shampoo, rinse your hair and body thoroughly to remove the shampoo.  4. Use CHG as you would any other liquid soap. You can apply CHG directly to the skin and wash gently with a scrungie or a clean washcloth.   5. Apply the CHG Soap to your body ONLY FROM THE NECK DOWN.  Do not use on open wounds or open sores. Avoid contact with your eyes, ears, mouth and genitals (private parts). Wash genitals (private parts) with your normal soap.  6. Wash thoroughly, paying special attention to the area where your surgery will be performed.  7. Thoroughly rinse your body with warm water from the neck down.  8. DO NOT shower/wash with your normal soap after using and rinsing off the CHG Soap.  9. Pat yourself dry with a CLEAN TOWEL.   10. Wear CLEAN PAJAMAS   11. Place CLEAN SHEETS on your bed the night of your first shower and DO NOT SLEEP WITH PETS.   Day of Surgery: Do not apply any deodorants/lotions. Please  wear clean clothes to the [name]/surgery center.      Please read over the  fact sheets that you were given.

## 2017-02-04 NOTE — Progress Notes (Signed)
Anesthesia Chart Review: Patient is a 46 year old female scheduled for excision posterior neck cyst on 02/06/17 by Dr. Coralie Keens.  She is Spanish speaking. Spanish interpreter used at PAT and also requested for day of surgery.   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   Meds include acyclovir, amlodipine, ASA 81 mg, Coreg, Zyrtec, clonidine, Florinef, Neurontin, Lantus, Humalog, magnesium-oxide, Myfortic, Protonix, prednisone, Crestor, sodium bicarbonate, Prograf.   - PCP is Dr. [name] with [facility] Everywhere). - Endocrinologist is Dr. Francetta Found with [facility] ([facility] Everywhere), last visit 01/31/17. He discontinued Victoza, encouraged compliance with Lantus and restarted Novolog meal coverage since A1c up and bedtime glucose levels elevated.   - She is followed at the [facility]).  BP (!) 139/55   Pulse 70   Temp 36.5 C   Resp 20   Ht 5\' 1"  (1.549 m)   Wt 174 lb 6.4 oz (79.1 kg)   SpO2 99%   BMI 32.95 kg/m   EKG 02/04/17: NSR, LAD.  Echo 01/06/15 (done during admission for hypertensive urgency): Study Conclusions - Left ventricle: The cavity size was normal. Wall thickness was   increased in a pattern of mild LVH. Systolic function was normal.   The estimated ejection fraction was in the range of 55% to 60%.   Wall motion was normal; there were no regional wall motion   abnormalities. - Mitral valve: Calcified annulus. There was mild regurgitation. - Atrial septum: No defect or patent foramen ovale was identified.  Nuclear stress test 12/29/14 (ordered by nephrologist Dr. Pearson Grippe):  Nuclear stress  EF: 60%.  The left ventricular ejection fraction is normal (55-65%).  There was no ST segment deviation noted during stress.  No T wave inversion was noted during stress.  The study is normal.  This is a low risk study.  Low risk stress nuclear study with normal perfusion and normal left ventricular regional and global systolic function.  Preoperative labs noted. Glucose 110. BUN 23, Cr 1.27. CBC WNL. A1c 8.7 on 01/31/17 ([place]). Hepatitis C RNC-PCR, Quantitative not detected 11/05/16 ([place]).  If no acute changes then I anticipate that she can proceed as planned.  George Hugh [facility] Short Stay Center/Anesthesiology Phone [phone] 02/04/2017 12:56 PM

## 2017-02-04 NOTE — Progress Notes (Signed)
Interpreter scheduled to arrive day of surgery at 0800 per Lennette Bihari with [employer].

## 2017-02-05 LAB — HEMOGLOBIN A1C
HEMOGLOBIN A1C: 9 % — AB (ref 4.8–5.6)
Mean Plasma Glucose: 212 mg/dL

## 2017-02-05 NOTE — H&P (Signed)
  Cynthia [name]  Location: [facility] Surgery Patient #: [id number] DOB: Aug 04, 1970 Separated / Language: Cleophus Molt / Race: White Female   History of Present Illness   Patient words: sebaceous cyst on neck.  The patient is a 46 year old female who presents with a complaint of Mass. This is a 46 year old female who we have seen in the past for both a lesion in her breast as well as a cyst on the back or neck. 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 Redmond Pulling. This is now recurred. It becomes intermittently infected and tender. It is increasing in size and hurts almost daily. She has not had to have a formal incision and drainage area  Other Problems Anxiety Disorder  Back Pain  Bladder Problems  Chest pain  Chronic Renal Failure Syndrome  Depression  Diabetes Mellitus  Gastroesophageal Reflux Disease  High blood pressure  Hypercholesterolemia  Lump In Breast  Sleep Apnea  Thyroid Disease   Past Surgical History   Breast Biopsy  Bilateral. Dialysis Shunt / Fistula   Diagnostic Studies History  Colonoscopy  never Mammogram  within last year Pap Smear  1-5 years ago  Allergies  Adhesive Tape  No Known Drug Allergies   Medication History   AmLODIPine Besylate (10MG  Tablet, Oral) Active. Carvedilol (25MG  Tablet, Oral) Active. Cetirizine HCl (10MG  Tablet, Oral) Active. Fludrocortisone Acetate (0.1MG  Tablet, Oral) Active. Lantus SoloStar (100UNIT/ML Soln Pen-inj, Subcutaneous) Active. MAGnesium-Oxide (400 (241.3 Mg)MG Tablet, Oral) Active. PredniSONE (5MG  Tablet, Oral) Active. Sodium Bicarbonate (650MG  Tablet, Oral) Active. Victoza (18MG /3ML Soln Pen-inj, Subcutaneous) Active. Mycophenolate Mofetil (500MG  Tablet, Oral) Active. Lactulose (20GM Packet, Oral) Active. Prograf (5MG  Capsule, Oral) Active. Medications Reconciled  Vitals  Weight: 177.8 lb Height: 61in Body Surface Area:  1.8 m Body Mass Index: 33.59 kg/m  Temp.: 97.25F(Oral)  Pulse: 92 (Regular)  BP: 140/80 (Sitting, Left Arm, Standard)    Physical Exam  The physical exam findings are as follows: Note:General she is well-appearing Lungs are clear Cardiovascular is regular rate and rhythm There is a 2 cm soft, slightly tender mass on her neck just below the base of the skull exteriorly. There is slight erythema but no open wound. Abdomen soft, non tender Skin otherwise normal    Assessment & Plan   INFECTED EPIDERMOID CYST (L72.0)  Impression: Because this is a recurrent cyst that is probably chronically infected and is causing pain, surgical excision is recommended to prevent further infections and to hopefully resolve the issue. I discussed the risk of surgery which includes but is not limited to bleeding, infection, recurrence, etc. She understands and wished to proceed with surgery

## 2017-02-06 ENCOUNTER — Encounter (HOSPITAL_COMMUNITY)
Admission: RE | Disposition: A | Discharge status: Home / Self Care | Payer: Self-pay | Source: Ambulatory Visit | Attending: Surgery

## 2017-02-06 ENCOUNTER — Ambulatory Visit (HOSPITAL_COMMUNITY): Payer: Medicare Other | Admitting: Vascular Surgery

## 2017-02-06 ENCOUNTER — Encounter (HOSPITAL_COMMUNITY): Payer: Self-pay | Admitting: *Deleted

## 2017-02-06 ENCOUNTER — Ambulatory Visit (HOSPITAL_COMMUNITY)
Admission: RE | Admit: 2017-02-06 | Discharge: 2017-02-06 | Disposition: A | Discharge status: Home / Self Care | Payer: Medicare Other | Source: Ambulatory Visit | Attending: Surgery | Admitting: Surgery

## 2017-02-06 ENCOUNTER — Ambulatory Visit (HOSPITAL_COMMUNITY): Payer: Medicare Other | Admitting: Certified Registered Nurse Anesthetist

## 2017-02-06 DIAGNOSIS — Z940 Kidney transplant status: Secondary | ICD-10-CM | POA: Insufficient documentation

## 2017-02-06 DIAGNOSIS — I120 Hypertensive chronic kidney disease with stage 5 chronic kidney disease or end stage renal disease: Secondary | ICD-10-CM | POA: Insufficient documentation

## 2017-02-06 DIAGNOSIS — L723 Sebaceous cyst: Secondary | ICD-10-CM | POA: Insufficient documentation

## 2017-02-06 DIAGNOSIS — E1122 Type 2 diabetes mellitus with diabetic chronic kidney disease: Secondary | ICD-10-CM | POA: Insufficient documentation

## 2017-02-06 DIAGNOSIS — Z79899 Other long term (current) drug therapy: Secondary | ICD-10-CM | POA: Insufficient documentation

## 2017-02-06 DIAGNOSIS — K219 Gastro-esophageal reflux disease without esophagitis: Secondary | ICD-10-CM | POA: Diagnosis not present

## 2017-02-06 DIAGNOSIS — Z794 Long term (current) use of insulin: Secondary | ICD-10-CM | POA: Insufficient documentation

## 2017-02-06 DIAGNOSIS — G4730 Sleep apnea, unspecified: Secondary | ICD-10-CM | POA: Insufficient documentation

## 2017-02-06 DIAGNOSIS — N186 End stage renal disease: Secondary | ICD-10-CM | POA: Diagnosis not present

## 2017-02-06 HISTORY — PX: LIPOMA EXCISION: SHX5283

## 2017-02-06 LAB — GLUCOSE, CAPILLARY
GLUCOSE-CAPILLARY: 153 mg/dL — AB (ref 65–99)
GLUCOSE-CAPILLARY: 218 mg/dL — AB (ref 65–99)

## 2017-02-06 SURGERY — EXCISION LIPOMA
Anesthesia: General | Site: Neck

## 2017-02-06 MED ORDER — BUPIVACAINE-EPINEPHRINE (PF) 0.25% -1:200000 IJ SOLN
INTRAMUSCULAR | Status: AC
  Filled 2017-02-06: qty 30

## 2017-02-06 MED ORDER — DEXAMETHASONE SODIUM PHOSPHATE 10 MG/ML IJ SOLN
INTRAMUSCULAR | Status: AC
  Filled 2017-02-06: qty 1

## 2017-02-06 MED ORDER — LIDOCAINE 2% (20 MG/ML) 5 ML SYRINGE
INTRAMUSCULAR | Status: DC | PRN
  Administered 2017-02-06: 20 mg via INTRAVENOUS

## 2017-02-06 MED ORDER — PROPOFOL 10 MG/ML IV BOLUS
INTRAVENOUS | Status: AC
  Filled 2017-02-06: qty 20

## 2017-02-06 MED ORDER — ROCURONIUM BROMIDE 10 MG/ML (PF) SYRINGE
PREFILLED_SYRINGE | INTRAVENOUS | Status: AC
  Filled 2017-02-06: qty 5

## 2017-02-06 MED ORDER — SODIUM CHLORIDE 0.9% FLUSH: 3.0000 mL | INTRAVENOUS | Status: DC | PRN

## 2017-02-06 MED ORDER — SUCCINYLCHOLINE CHLORIDE 200 MG/10ML IV SOSY
PREFILLED_SYRINGE | INTRAVENOUS | Status: DC | PRN
  Administered 2017-02-06: 60 mg via INTRAVENOUS

## 2017-02-06 MED ORDER — CHLORHEXIDINE GLUCONATE CLOTH 2 % EX PADS: 6.0000 | MEDICATED_PAD | Freq: Once | CUTANEOUS | Status: DC

## 2017-02-06 MED ORDER — FENTANYL CITRATE (PF) 250 MCG/5ML IJ SOLN
INTRAMUSCULAR | Status: AC
  Filled 2017-02-06: qty 5

## 2017-02-06 MED ORDER — ACETAMINOPHEN 650 MG RE SUPP: 650.0000 mg | RECTAL | Status: DC | PRN

## 2017-02-06 MED ORDER — SODIUM CHLORIDE 0.9% FLUSH: 3.0000 mL | Freq: Two times a day (BID) | INTRAVENOUS | Status: DC

## 2017-02-06 MED ORDER — OXYCODONE-ACETAMINOPHEN 5-325 MG PO TABS
1.0000 | ORAL_TABLET | ORAL | 0 refills | Status: DC | PRN
Start: 2017-02-06 — End: 2018-02-18

## 2017-02-06 MED ORDER — OXYCODONE HCL 5 MG PO TABS: 5.0000 mg | ORAL_TABLET | ORAL | Status: DC | PRN

## 2017-02-06 MED ORDER — SODIUM CHLORIDE 0.9 % IV SOLN
INTRAVENOUS | Status: DC
  Administered 2017-02-06: 09:00:00 via INTRAVENOUS

## 2017-02-06 MED ORDER — ONDANSETRON HCL 4 MG/2ML IJ SOLN
INTRAMUSCULAR | Status: AC
  Filled 2017-02-06: qty 2

## 2017-02-06 MED ORDER — DOXYCYCLINE HYCLATE 100 MG PO TABS: 100.0000 mg | ORAL_TABLET | Freq: Two times a day (BID) | ORAL | 2 refills | Status: DC

## 2017-02-06 MED ORDER — ONDANSETRON HCL 4 MG/2ML IJ SOLN
INTRAMUSCULAR | Status: DC | PRN
  Administered 2017-02-06: 4 mg via INTRAVENOUS

## 2017-02-06 MED ORDER — LIDOCAINE 2% (20 MG/ML) 5 ML SYRINGE
INTRAMUSCULAR | Status: AC
  Filled 2017-02-06: qty 5

## 2017-02-06 MED ORDER — 0.9 % SODIUM CHLORIDE (POUR BTL) OPTIME
TOPICAL | Status: DC | PRN
  Administered 2017-02-06: 1000 mL

## 2017-02-06 MED ORDER — FENTANYL CITRATE (PF) 100 MCG/2ML IJ SOLN
INTRAMUSCULAR | Status: DC | PRN
  Administered 2017-02-06 (×2): 25 ug via INTRAVENOUS

## 2017-02-06 MED ORDER — CEFAZOLIN SODIUM-DEXTROSE 2-4 GM/100ML-% IV SOLN
INTRAVENOUS | Status: AC
  Filled 2017-02-06: qty 100

## 2017-02-06 MED ORDER — HYDROMORPHONE HCL 1 MG/ML IJ SOLN
INTRAMUSCULAR | Status: AC
  Filled 2017-02-06: qty 1

## 2017-02-06 MED ORDER — MORPHINE SULFATE (PF) 2 MG/ML IV SOLN: 1.0000 mg | INTRAVENOUS | Status: DC | PRN

## 2017-02-06 MED ORDER — MIDAZOLAM HCL 2 MG/2ML IJ SOLN
INTRAMUSCULAR | Status: AC
  Filled 2017-02-06: qty 2

## 2017-02-06 MED ORDER — CEFAZOLIN SODIUM-DEXTROSE 2-4 GM/100ML-% IV SOLN
2.0000 g | INTRAVENOUS | Status: AC
  Administered 2017-02-06: 2 g via INTRAVENOUS

## 2017-02-06 MED ORDER — ACETAMINOPHEN 325 MG PO TABS: 650.0000 mg | ORAL_TABLET | ORAL | Status: DC | PRN

## 2017-02-06 MED ORDER — MIDAZOLAM HCL 5 MG/5ML IJ SOLN
INTRAMUSCULAR | Status: DC | PRN
  Administered 2017-02-06: 1 mg via INTRAVENOUS

## 2017-02-06 MED ORDER — HYDROMORPHONE HCL 1 MG/ML IJ SOLN
0.2500 mg | INTRAMUSCULAR | Status: DC | PRN
  Administered 2017-02-06: 0.5 mg via INTRAVENOUS

## 2017-02-06 MED ORDER — BUPIVACAINE-EPINEPHRINE 0.25% -1:200000 IJ SOLN
INTRAMUSCULAR | Status: DC | PRN
  Administered 2017-02-06: 10 mL

## 2017-02-06 MED ORDER — SODIUM CHLORIDE 0.9 % IV SOLN: 250.0000 mL | INTRAVENOUS | Status: DC | PRN

## 2017-02-06 MED ORDER — PROPOFOL 10 MG/ML IV BOLUS
INTRAVENOUS | Status: DC | PRN
  Administered 2017-02-06: 180 mg via INTRAVENOUS
  Administered 2017-02-06: 20 mg via INTRAVENOUS

## 2017-02-06 MED ORDER — LIDOCAINE HCL (PF) 1 % IJ SOLN
INTRAMUSCULAR | Status: AC
  Filled 2017-02-06: qty 30

## 2017-02-06 SURGICAL SUPPLY — 45 items
ADH SKN CLS APL DERMABOND .7 (GAUZE/BANDAGES/DRESSINGS) ×1
BLADE SURG 15 STRL LF DISP TIS (BLADE) ×1 IMPLANT
BLADE SURG 15 STRL SS (BLADE) ×1
CANISTER SUCT 3000ML PPV (MISCELLANEOUS) ×2 IMPLANT
CHLORAPREP W/TINT 10.5 ML (MISCELLANEOUS) ×2 IMPLANT
CONT SPEC 4OZ CLIKSEAL STRL BL (MISCELLANEOUS) ×2 IMPLANT
COVER SURGICAL LIGHT HANDLE (MISCELLANEOUS) ×2 IMPLANT
DECANTER SPIKE VIAL GLASS SM (MISCELLANEOUS) ×2 IMPLANT
DERMABOND ADVANCED (GAUZE/BANDAGES/DRESSINGS) ×1
DERMABOND ADVANCED .7 DNX12 (GAUZE/BANDAGES/DRESSINGS) ×1 IMPLANT
DRAPE LAPAROTOMY 100X72 PEDS (DRAPES) ×2 IMPLANT
DRAPE UTILITY XL STRL (DRAPES) ×4 IMPLANT
ELECT CAUTERY BLADE 6.4 (BLADE) ×2 IMPLANT
ELECT REM PT RETURN 9FT ADLT (ELECTROSURGICAL) ×2
ELECTRODE REM PT RTRN 9FT ADLT (ELECTROSURGICAL) ×1 IMPLANT
GAUZE SPONGE 2X2 8PLY STRL LF (GAUZE/BANDAGES/DRESSINGS) ×1 IMPLANT
GAUZE SPONGE 4X4 12PLY STRL (GAUZE/BANDAGES/DRESSINGS) ×2 IMPLANT
GAUZE SPONGE 4X4 16PLY XRAY LF (GAUZE/BANDAGES/DRESSINGS) ×2 IMPLANT
GLOVE SURG SIGNA 7.5 PF LTX (GLOVE) ×2 IMPLANT
GLOVE SURG SS PI 7.0 STRL IVOR (GLOVE) ×2 IMPLANT
GOWN STRL REUS W/ TWL LRG LVL3 (GOWN DISPOSABLE) ×1 IMPLANT
GOWN STRL REUS W/ TWL XL LVL3 (GOWN DISPOSABLE) ×1 IMPLANT
GOWN STRL REUS W/TWL LRG LVL3 (GOWN DISPOSABLE) ×1
GOWN STRL REUS W/TWL XL LVL3 (GOWN DISPOSABLE) ×2
KIT BASIN OR (CUSTOM PROCEDURE TRAY) ×2 IMPLANT
KIT ROOM TURNOVER OR (KITS) ×2 IMPLANT
NEEDLE HYPO 25GX1X1/2 BEV (NEEDLE) ×2 IMPLANT
NS IRRIG 1000ML POUR BTL (IV SOLUTION) ×2 IMPLANT
PACK SURGICAL SETUP 50X90 (CUSTOM PROCEDURE TRAY) ×2 IMPLANT
PAD ARMBOARD 7.5X6 YLW CONV (MISCELLANEOUS) ×4 IMPLANT
PENCIL BUTTON HOLSTER BLD 10FT (ELECTRODE) ×2 IMPLANT
SPECIMEN JAR SMALL (MISCELLANEOUS) ×2 IMPLANT
SPONGE GAUZE 2X2 STER 10/PKG (GAUZE/BANDAGES/DRESSINGS) ×1
SPONGE LAP 18X18 X RAY DECT (DISPOSABLE) ×2 IMPLANT
SUT MNCRL AB 4-0 PS2 18 (SUTURE) ×2 IMPLANT
SUT PROLENE 3 0 PS 2 (SUTURE) ×2 IMPLANT
SUT PROLENE 3 0 SH 48 (SUTURE) ×2 IMPLANT
SUT VIC AB 3-0 SH 27 (SUTURE) ×1
SUT VIC AB 3-0 SH 27XBRD (SUTURE) ×1 IMPLANT
SYR BULB 3OZ (MISCELLANEOUS) ×2 IMPLANT
SYR CONTROL 10ML LL (SYRINGE) ×2 IMPLANT
TOWEL OR 17X24 6PK STRL BLUE (TOWEL DISPOSABLE) ×2 IMPLANT
TOWEL OR 17X26 10 PK STRL BLUE (TOWEL DISPOSABLE) ×2 IMPLANT
TUBE CONNECTING 12X1/4 (SUCTIONS) ×2 IMPLANT
YANKAUER SUCT BULB TIP NO VENT (SUCTIONS) ×2 IMPLANT

## 2017-02-06 NOTE — Interval H&P Note (Signed)
History and Physical Interval Note: no change in H and P  02/06/2017 9:25 AM  Cynthia [name]  has presented today for surgery, with the diagnosis of posterior neck cyst  The various methods of treatment have been discussed with the patient and family. After consideration of risks, benefits and other options for treatment, the patient has consented to  Procedure(s): EXCISION POSTERIOR NECK SEBACEOUS CYST (N/A) as a surgical intervention .  The patient's history has been reviewed, patient examined, no change in status, stable for surgery.  I have reviewed the patient's chart and labs.  Questions were answered to the patient's satisfaction.     Graylee Arutyunyan A

## 2017-02-06 NOTE — Anesthesia Preprocedure Evaluation (Addendum)
Anesthesia Evaluation  Patient identified by MRN, date of birth, ID band Patient awake    Reviewed: Allergy & Precautions, H&P , NPO status , Patient's Chart, lab work & pertinent test results, reviewed documented beta blocker date and time   Airway Mallampati: I  TM Distance: >3 FB Neck ROM: Full    Dental no notable dental hx. (+) Teeth Intact, Dental Advisory Given   Pulmonary neg pulmonary ROS,    Pulmonary exam normal breath sounds clear to auscultation       Cardiovascular hypertension, Pt. on medications and Pt. on home beta blockers  Rhythm:Regular Rate:Normal     Neuro/Psych Anxiety Depression negative neurological ROS     GI/Hepatic Neg liver ROS, GERD  Medicated and Controlled,  Endo/Other  diabetes, Insulin Dependent  Renal/GU S/p kidney transplant  negative genitourinary   Musculoskeletal   Abdominal   Peds  Hematology negative hematology ROS (+) anemia ,   Anesthesia Other Findings   Reproductive/Obstetrics negative OB ROS                            Anesthesia Physical Anesthesia Plan  ASA: III  Anesthesia Plan: General   Post-op Pain Management:    Induction: Intravenous  PONV Risk Score and Plan: 4 or greater and Ondansetron, Dexamethasone and Midazolam  Airway Management Planned: LMA and Oral ETT  Additional Equipment:   Intra-op Plan:   Post-operative Plan: Extubation in OR  Informed Consent: I have reviewed the patients History and Physical, chart, labs and discussed the procedure including the risks, benefits and alternatives for the proposed anesthesia with the patient or authorized representative who has indicated his/her understanding and acceptance.   Dental advisory given  Plan Discussed with: CRNA  Anesthesia Plan Comments:        Anesthesia Quick Evaluation

## 2017-02-06 NOTE — Anesthesia Postprocedure Evaluation (Signed)
Anesthesia Post Note  Patient: Cynthia [name]  Procedure(s) Performed: Procedure(s) (LRB): EXCISION POSTERIOR NECK SEBACEOUS CYST (N/A)     Patient location during evaluation: PACU Anesthesia Type: General Level of consciousness: awake and alert Pain management: pain level controlled Vital Signs Assessment: post-procedure vital signs reviewed and stable Respiratory status: spontaneous breathing, nonlabored ventilation, respiratory function stable and patient connected to nasal cannula oxygen Cardiovascular status: blood pressure returned to baseline and stable Postop Assessment: no signs of nausea or vomiting Anesthetic complications: no    Last Vitals:  Vitals:   02/06/17 1105 02/06/17 1112  BP: (!) 148/65 (!) 148/62  Pulse: 67 64  Resp: 12 13  Temp:  36.4 C  SpO2: 98% 98%    Last Pain:  Vitals:   02/06/17 1057  TempSrc:   PainSc: Asleep                 Eamon Tantillo

## 2017-02-06 NOTE — Anesthesia Procedure Notes (Signed)
Procedure Name: Intubation Date/Time: 02/06/2017 10:02 AM Performed by: West Pugh Pre-anesthesia Checklist: Patient identified, Emergency Drugs available, Suction available, Patient being monitored and Timeout performed Patient Re-evaluated:Patient Re-evaluated prior to induction Oxygen Delivery Method: Circle system utilized Preoxygenation: Pre-oxygenation with 100% oxygen Induction Type: IV induction Ventilation: Mask ventilation without difficulty Laryngoscope Size: Mac and 3 Grade View: Grade I Tube type: Oral Tube size: 7.0 mm Number of attempts: 1 Airway Equipment and Method: Stylet Placement Confirmation: ETT inserted through vocal cords under direct vision,  positive ETCO2,  CO2 detector and breath sounds checked- equal and bilateral Secured at: 21 cm Tube secured with: Tape Dental Injury: Teeth and Oropharynx as per pre-operative assessment

## 2017-02-06 NOTE — Transfer of Care (Signed)
Immediate Anesthesia Transfer of Care Note  Patient: Cynthia [name]  Procedure(s) Performed: Procedure(s): EXCISION POSTERIOR NECK SEBACEOUS CYST (N/A)  Patient Location: PACU  Anesthesia Type:General  Level of Consciousness:  sedated, patient cooperative and responds to stimulation  Airway & Oxygen Therapy:Patient Spontanous Breathing and Patient connected to face mask oxgen  Post-op Assessment:  Report given to PACU RN and Post -op Vital signs reviewed and stable  Post vital signs:  Reviewed and stable  Last Vitals:  Vitals:   02/06/17 0823 02/06/17 1036  BP: (!) 167/62 (!) 157/64  Pulse: 63 67  Resp: 20 13  Temp: 36.6 C (!) 36.4 C  SpO2: 83% 07%    Complications: No apparent anesthesia complications

## 2017-02-06 NOTE — Discharge Instructions (Signed)
Ok to shower tomorrow  Ice pack also for pain  Call office.  Sutures need to be removed in 2 weeks

## 2017-02-06 NOTE — Op Note (Signed)
EXCISION POSTERIOR NECK SEBACEOUS CYST  Procedure Note  Cynthia [name] 02/06/2017   Pre-op Diagnosis: posterior neck cyst     Post-op Diagnosis: infected posterior neck sebaceous cyst  Procedure(s): EXCISION POSTERIOR NECK SEBACEOUS CYST ( 2 cm )  Surgeon(s): Coralie Keens, MD  Anesthesia: Choice  Staff:  Circulator: Susann Givens, RN Scrub Person: Thereasa Distance T  Estimated Blood Loss: Minimal               Procedure: The patient was brought to the operating room and identified as the correct patient. She was placed supine on the operating room table and general anesthesia was induced. The patient was then placed in the left lateral decubitus position. Her posterior neck was shaved. I then prepped and draped in the usual sterile fashion. I made an incision over the sebaceous cyst with a scalpel. There was some purulence in the cyst. I excised the cyst and its capsule piecemeal. I then achieved hemostasis with the cautery. I irrigated the wound with saline. I anesthetized with Marcaine. I then closed the incision with interrupted 3-0 Prolene sutures. Gauze and tape were then applied. The patient tolerated the procedure well. All the counts were correct at the end of the procedure. The patient was then extubated in the operating room and taken in a stable condition to the recovery room.          Chasady Longwell A   Date: 02/06/2017  Time: 10:26 AM

## 2017-02-07 ENCOUNTER — Encounter (HOSPITAL_COMMUNITY): Payer: Self-pay | Admitting: Surgery

## 2017-09-16 ENCOUNTER — Emergency Department (HOSPITAL_COMMUNITY)
Admission: EM | Admit: 2017-09-16 | Discharge: 2017-09-16 | Discharge status: Against Medical Advice | Payer: Medicare Other

## 2017-09-16 ENCOUNTER — Other Ambulatory Visit: Payer: Self-pay

## 2018-01-28 ENCOUNTER — Other Ambulatory Visit (HOSPITAL_COMMUNITY): Payer: Self-pay | Admitting: Student

## 2018-01-28 ENCOUNTER — Other Ambulatory Visit: Payer: Self-pay | Admitting: Student

## 2018-01-28 DIAGNOSIS — M5412 Radiculopathy, cervical region: Secondary | ICD-10-CM

## 2018-02-05 ENCOUNTER — Ambulatory Visit (HOSPITAL_COMMUNITY)
Admission: RE | Admit: 2018-02-05 | Discharge: 2018-02-05 | Disposition: A | Discharge status: Home / Self Care | Payer: Medicare Other | Source: Ambulatory Visit | Attending: Student | Admitting: Student

## 2018-02-05 DIAGNOSIS — M4726 Other spondylosis with radiculopathy, lumbar region: Secondary | ICD-10-CM | POA: Diagnosis not present

## 2018-02-05 DIAGNOSIS — M5412 Radiculopathy, cervical region: Secondary | ICD-10-CM | POA: Diagnosis present

## 2018-02-07 ENCOUNTER — Other Ambulatory Visit: Payer: Self-pay | Admitting: Family Medicine

## 2018-02-07 DIAGNOSIS — Z1231 Encounter for screening mammogram for malignant neoplasm of breast: Secondary | ICD-10-CM

## 2018-02-12 ENCOUNTER — Ambulatory Visit
Admission: RE | Admit: 2018-02-12 | Discharge: 2018-02-12 | Disposition: A | Discharge status: Home / Self Care | Payer: Medicare Other | Source: Ambulatory Visit | Attending: Family Medicine | Admitting: Family Medicine

## 2018-02-12 DIAGNOSIS — Z1231 Encounter for screening mammogram for malignant neoplasm of breast: Secondary | ICD-10-CM

## 2018-02-18 ENCOUNTER — Encounter: Payer: Self-pay | Admitting: Endocrinology

## 2018-02-18 ENCOUNTER — Ambulatory Visit (INDEPENDENT_AMBULATORY_CARE_PROVIDER_SITE_OTHER): Payer: Medicare Other | Admitting: Endocrinology

## 2018-02-18 VITALS — BP 118/50 | HR 66 | Ht 61.0 in | Wt 173.0 lb

## 2018-02-18 DIAGNOSIS — E1129 Type 2 diabetes mellitus with other diabetic kidney complication: Secondary | ICD-10-CM

## 2018-02-18 DIAGNOSIS — Z794 Long term (current) use of insulin: Secondary | ICD-10-CM

## 2018-02-18 DIAGNOSIS — IMO0001 Reserved for inherently not codable concepts without codable children: Secondary | ICD-10-CM

## 2018-02-18 LAB — POCT GLYCOSYLATED HEMOGLOBIN (HGB A1C): HEMOGLOBIN A1C: 8.3 % — AB (ref 4.0–5.6)

## 2018-02-18 MED ORDER — INSULIN GLARGINE 100 UNIT/ML SOLOSTAR PEN: 14.0000 [IU] | PEN_INJECTOR | Freq: Every day | SUBCUTANEOUS | 11 refills | Status: DC

## 2018-02-18 MED ORDER — GLUCOSE BLOOD VI STRP: 1.0000 | ORAL_STRIP | Freq: Two times a day (BID) | 12 refills | Status: DC

## 2018-02-18 MED ORDER — INSULIN LISPRO 100 UNIT/ML (KWIKPEN): 7.0000 [IU] | PEN_INJECTOR | Freq: Three times a day (TID) | SUBCUTANEOUS | 11 refills | Status: DC

## 2018-02-18 NOTE — Progress Notes (Signed)
Subjective:     Patient ID: Cynthia [name], female   DOB: 06-Oct-1970, 47 y.o.   MRN: [id number]  HPI pt is referred by Dr Doreene Nest, for diabetes.  Pt states DM was dx'ed in 1993 (she had GDM in 1990); she has mild if any neuropathy of the lower extremities; she has associated DR and ESRD (transplant 2017); she has been on insulin since soon after dx; pt says her diet and exercise are fair; she has never had pancreatitis, pancreatic surgery, or DKA.  last episode of severe hypoglycemia was 1 month ago.  This happened in the middle of the night.  She takes lantus,16 units qhs, and humalog 6 units 3 times a day (just before each meal).  She also takes extra humalog, 2 units for each 50 by which cbg exceeds 100.  she brings a record of her cbg's which I have reviewed today.  Almost all are checked fasting.  it varies from 74-300's.  She has sxs of hypoglycemia approx once per week, usually in the middle of the night.  She declined pump rx, due to high copay.  Past Medical History:  Diagnosis Date  . Anemia    when on dialysis  . Anxiety   . Depression   . ESRD on hemodialysis ([place])    Home HD 5x per week- not on dialysis now had tramsplant 4/17  . GERD (gastroesophageal reflux disease)   . History of blood transfusion    transfusion reaction  . Hypertension   . Insulin-dependent diabetes mellitus with renal complications ([place])    Type I beginning now type II per pt-dr levy also II  . Kidney transplant recipient   . Neuromuscular disorder ([place])    NEUROPATHY    Past Surgical History:  Procedure Laterality Date  . ABDOMINAL HYSTERECTOMY    . BREAST LUMPECTOMY WITH RADIOACTIVE SEED LOCALIZATION Right 09/14/2015   Procedure: RIGHT BREAST LUMPECTOMY WITH RADIOACTIVE SEED LOCALIZATION;  Surgeon: Donnie Mesa, MD;  Location: [place];  Service: General;  Laterality: Right;  . BREAST SURGERY Bilateral    biopsy bilateral  . CATARACT EXTRACTION Bilateral    bilateral  .  CHOLECYSTECTOMY    . CYST REMOVAL NECK    . DIALYSIS FISTULA CREATION Left   . EYE SURGERY Bilateral    lazer  . LEFT HEART CATHETERIZATION WITH CORONARY ANGIOGRAM N/A 03/30/2014   Procedure: LEFT HEART CATHETERIZATION WITH CORONARY ANGIOGRAM;  Surgeon: Sinclair Grooms, MD;  Location: [facility] CATH LAB;  Service: Cardiovascular;  Laterality: N/A;  . LIPOMA EXCISION N/A 02/06/2017   Procedure: EXCISION POSTERIOR NECK SEBACEOUS CYST;  Surgeon: Coralie Keens, MD;  Location: [place];  Service: General;  Laterality: N/A;  . 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 REVISON OF ARTERIOVENOUS FISTULA Left 09/28/2013   Procedure: EXCISE ESCHAR LEFT ARM  ARTERIOVENOUS FISTULA WITH PLICATION OF LEFT ARM ARTERIOVENOUS FISTULA;  Surgeon: Elam Dutch, MD;  Location: [place];  Service: Vascular;  Laterality: Left;  . REVISON OF ARTERIOVENOUS FISTULA Left 08/26/2015   Procedure: RESECTION ANEURYSM OF LEFT ARM ARTERIOVENOUS FISTULA  ;  Surgeon: Serafina Mitchell, MD;  Location: [place];  Service: Vascular;  Laterality: Left;  . TUBAL LIGATION      Social History   Socioeconomic History  . Marital status: Legally Separated    Spouse name: Not on  file  . Number of children: 3  . Years of education: 9  . Highest education level: Not on file  Occupational History  . Occupation: disabled  Social Needs  . Financial resource strain: Not on file  . Food insecurity:    Worry: Not on file    Inability: Not on file  . Transportation needs:    Medical: Not on file    Non-medical: Not on file  Tobacco Use  . Smoking status: Never Smoker  . Smokeless tobacco: Never Used  Substance and Sexual Activity  . Alcohol use: No  . Drug use: No  . Sexual activity: Yes  Lifestyle  . Physical activity:    Days per week: Not on file    Minutes per session: Not on  file  . Stress: Not on file  Relationships  . Social connections:    Talks on phone: Not on file    Gets together: Not on file    Attends religious service: Not on file    Active member of club or organization: Not on file    Attends meetings of clubs or organizations: Not on file    Relationship status: Not on file  . Intimate partner violence:    Fear of current or ex partner: Not on file    Emotionally abused: Not on file    Physically abused: Not on file    Forced sexual activity: Not on file  Other Topics Concern  . Not on file  Social History Narrative   Lives in home with daughters, grand daughter, son-in-law   Caffeine use - 1 cup coffee sometimes     Current Outpatient Medications on File Prior to Visit  Medication Sig Dispense Refill  . amLODipine (NORVASC) 5 MG tablet Take by mouth.    Marland Kitchen aspirin EC 81 MG EC tablet Take 1 tablet (81 mg total) by mouth daily. 90 tablet 3  . Biotin 10 MG CAPS Take 10 mg by mouth daily.     . carvedilol (COREG) 12.5 MG tablet Take by mouth.    . cetirizine (ZYRTEC) 10 MG tablet Take 5 mg by mouth daily. Takes 0.5 tablet daily    . Cholecalciferol (VITAMIN D3) 1000 units CAPS Take 1,000 Units by mouth daily.     . cloNIDine (CATAPRES) 0.1 MG tablet Take 2 tablets (0.2 mg total) by mouth 2 (two) times daily. (Patient taking differently: Take 0.1 mg by mouth daily. ) 10 tablet 0  . docusate sodium (COLACE) 100 MG capsule Take 100 mg by mouth 2 (two) times daily.    . fludrocortisone (FLORINEF) 0.1 MG tablet Take 0.1 mg by mouth daily.    Marland Kitchen gabapentin (NEURONTIN) 300 MG capsule Take 300 mg by mouth at bedtime.  12  . magnesium oxide (MAG-OX) 400 MG tablet TAKE 1 TABLET(400 MG) BY MOUTH TWICE DAILY    . MAGNESIUM-OXIDE 400 (241.3 Mg) MG tablet Take 400 mg by mouth daily.   11  . mycophenolate (MYFORTIC) 180 MG EC tablet Take 360 mg by mouth 2 (two) times daily.    . mycophenolate (MYFORTIC) 360 MG TBEC EC tablet Take 720 mg by mouth 2 (two)  times daily.    . pantoprazole (PROTONIX) 40 MG tablet Take 40 mg by mouth daily.  5  . predniSONE (DELTASONE) 5 MG tablet TAKE 1 TABLET DAILY  2  . rosuvastatin (CRESTOR) 5 MG tablet Take 5 mg by mouth at bedtime.    . sodium bicarbonate 650 MG tablet Take  1 tablet (650 mg total) by mouth 3 (three) times daily. (Patient taking differently: Take 650 mg by mouth 2 (two) times daily. ) 90 tablet 1  . tacrolimus (PROGRAF) 1 MG capsule 2 mg at 9 AM, 1 mg at 9 PM---Z94.0    . polyethylene glycol powder (GLYCOLAX/MIRALAX) powder MIX 17 GRAMS WITH LIQUID AND TAKE PO DAILY  0   No current facility-administered medications on file prior to visit.     Allergies  Allergen Reactions  . Other Rash    Burns skin.Please use paper tape only Burns skin.Please use paper tape only  . Tape Other (See Comments)    Burns skin.  Please use paper tape only    Family History  Problem Relation Age of Onset  . Cancer Maternal Grandmother   . Diabetes Maternal Grandfather   . Diabetes Paternal Grandmother   . Diabetes Mother   . Kidney disease Mother   . Diabetes Father   . Heart disease Father   . Kidney disease Father   . Diabetes Sister   . Asthma Sister   . Lupus Sister   . Diabetes Brother     BP (!) 118/50 (BP Location: Right Arm, Patient Position: Sitting, Cuff Size: Normal)   Pulse 66   Ht 5\' 1"  (1.549 m)   Wt 173 lb (78.5 kg)   SpO2 98%   BMI 32.69 kg/m    Review of Systems denies weight loss, headache, chest pain, sob, n/v, and cold intolerance.  She has chronically blurry vision.  She has slight doe, leg cramps, mild memory loss, excessive diaphoresis, easy bruising, rhinorrhea, and nocturia.  Depression is well-controlled.     Objective:   Physical Exam VS: see vs page GEN: no distress HEAD: head: no deformity eyes: no periorbital swelling, no proptosis external nose and ears are normal mouth: no lesion seen NECK: supple, thyroid is not enlarged CHEST WALL: no  deformity LUNGS: clear to auscultation CV: reg rate and rhythm.  Murmur is transmitted from the fistula ABD: abdomen is soft, nontender.  no hepatosplenomegaly.  not distended.  no hernia MUSCULOSKELETAL: muscle bulk and strength are grossly normal.  no obvious joint swelling.  gait is normal and steady EXTEMITIES: no deformity.  no ulcer on the feet.  feet are of normal color and temp.  no edema PULSES: dorsalis pedis intact bilat.  no carotid bruit.  AVF at the LUE NEURO:  cn 2-12 grossly intact.   readily moves all 4's.  sensation is intact to touch on the feet SKIN:  Normal texture and temperature.  No rash or suspicious lesion is visible.  Old healed surgical scars at the left ankle.  Few ecchymoses at the left leg. NODES:  None palpable at the neck PSYCH: alert, well-oriented.  Does not appear anxious nor depressed.  I have reviewed outside records, and summarized: Pt was noted to have elevated a1c, and referred here.  She was noted to be taking anti-rejection meds intermittently  outside test results are reviewed:  Creat=1.03  A1c=8.3%  Assessment:  Insulin-requiring type 2 DM, with renal failure/transplant: she needs increased rx. Noncompliance with meds, new to me: she may need a qd insulin schedule, but we'll try multiple daily injections first    Plan:   Patient Instructions  good diet and exercise significantly improve the control of your diabetes.  please let me know if you wish to be referred to a dietician.  high blood sugar is very risky to your health.  you should  see an eye doctor and dentist every year.  It is very important to get all recommended vaccinations.  Controlling your blood pressure and cholesterol drastically reduces the damage diabetes does to your body.  Those who smoke should quit.  Please discuss these with your doctor.  check your blood sugar twice a day.  vary the time of day when you check, between before the 3 meals, and at bedtime.  also check if  you have symptoms of your blood sugar being too high or too low.  please keep a record of the readings and bring it to your next appointment here (or you can bring the meter itself).  You can write it on any piece of paper.  please call us sooner if your blood sugar goes below 70, or if you have a lot of readings over 200. For now, please: Reduce the lantus to 14 units at bedtime, and: Increase the humalog to 7 units 3 times a day (just before each meal), and: Take just 1 extra unit of humalog for each 100 that your blood sugar is over 100. Please come back for a follow-up appointment in 1 month.     Pamala Hurry dieta y ejercicio mejoran significativamente el control de su diabetes. por favor avseme si desea ser referido a un dietista. el nivel alto de azcar en la sangre es muy riesgoso para su salud. Debe consultar a un oftalmlogo y [employer]. Es muy importante recibir todas las vacunas recomendadas. Controlar la presin arterial y el colesterol reduce drsticamente el dao que la diabetes causa en su cuerpo. Los que fuman deben dejar de Nature conservation officer. Por favor discuta esto con su mdico. controle su nivel de azcar en [place]. vare la hora del da cuando realiza la verificacin, entre antes de las 3 comidas y antes de [place]. Tambin verifique si tiene sntomas de que su nivel de azcar en la sangre es demasiado alto o [place]. mantenga un registro de las lecturas y Air cabin crew a su prxima cita aqu (o puede traer Nature conservation officer). Puedes escribirlo en cualquier hoja de papel. llmenos antes si su nivel de azcar en la sangre est por debajo de 70, o si tiene muchas lecturas por encima de 200. Por ahora, por favor: Reduzca el lantus a 14 unidades al [employer] y: [employer] humalog a 7 unidades 3 veces al Training and development officer (justo antes de cada comida) y: Jomarie Longs solo 1 unidad adicional de humalog por cada 100 que su nivel de Museum/gallery exhibitions officer sea superior a 100. Regrese para una cita de  seguimiento en 1 mes.

## 2018-02-18 NOTE — Patient Instructions (Addendum)
good diet and exercise significantly improve the control of your diabetes.  please let me know if you wish to be referred to a dietician.  high blood sugar is very risky to your health.  you should see an eye doctor and dentist every year.  It is very important to get all recommended vaccinations.  Controlling your blood pressure and cholesterol drastically reduces the damage diabetes does to your body.  Those who smoke should quit.  Please discuss these with your doctor.  check your blood sugar twice a day.  vary the time of day when you check, between before the 3 meals, and at bedtime.  also check if you have symptoms of your blood sugar being too high or too low.  please keep a record of the readings and bring it to your next appointment here (or you can bring the meter itself).  You can write it on any piece of paper.  please call us sooner if your blood sugar goes below 70, or if you have a lot of readings over 200. For now, please: Reduce the lantus to 14 units at bedtime, and: Increase the humalog to 7 units 3 times a day (just before each meal), and: Take just 1 extra unit of humalog for each 100 that your blood sugar is over 100. Please come back for a follow-up appointment in 1 month.     Pamala Hurry dieta y ejercicio mejoran significativamente el control de su diabetes. por favor avseme si desea ser referido a un dietista. el nivel alto de azcar en la sangre es muy riesgoso para su salud. Debe consultar a un oftalmlogo y [employer]. Es muy importante recibir todas las vacunas recomendadas. Controlar la presin arterial y el colesterol reduce drsticamente el dao que la diabetes causa en su cuerpo. Los que fuman deben dejar de Nature conservation officer. Por favor discuta esto con su mdico. controle su nivel de azcar en [place]. vare la hora del da cuando realiza la verificacin, entre antes de las 3 comidas y antes de [place]. Tambin verifique si tiene sntomas de que su  nivel de azcar en la sangre es demasiado alto o [place]. mantenga un registro de las lecturas y Air cabin crew a su prxima cita aqu (o puede traer Nature conservation officer). Puedes escribirlo en cualquier hoja de papel. llmenos antes si su nivel de azcar en la sangre est por debajo de 70, o si tiene muchas lecturas por encima de 200. Por ahora, por favor: Reduzca el lantus a 14 unidades al [employer] y: [employer] humalog a 7 unidades 3 veces al Training and development officer (justo antes de cada comida) y: Jomarie Longs solo 1 unidad adicional de humalog por cada 100 que su nivel de Museum/gallery exhibitions officer sea superior a 100. Regrese para una cita de seguimiento en 1 mes.

## 2018-02-19 ENCOUNTER — Other Ambulatory Visit: Payer: Self-pay

## 2018-03-21 ENCOUNTER — Ambulatory Visit: Payer: Medicare Other | Admitting: Endocrinology

## 2018-03-21 DIAGNOSIS — Z0289 Encounter for other administrative examinations: Secondary | ICD-10-CM

## 2018-04-16 ENCOUNTER — Encounter: Payer: Self-pay | Admitting: Endocrinology

## 2018-04-16 ENCOUNTER — Ambulatory Visit (INDEPENDENT_AMBULATORY_CARE_PROVIDER_SITE_OTHER): Payer: Medicare Other | Admitting: Endocrinology

## 2018-04-16 VITALS — BP 162/88 | HR 72 | Ht 61.0 in | Wt 180.0 lb

## 2018-04-16 DIAGNOSIS — Z794 Long term (current) use of insulin: Secondary | ICD-10-CM | POA: Diagnosis not present

## 2018-04-16 DIAGNOSIS — E1129 Type 2 diabetes mellitus with other diabetic kidney complication: Secondary | ICD-10-CM | POA: Diagnosis not present

## 2018-04-16 DIAGNOSIS — IMO0001 Reserved for inherently not codable concepts without codable children: Secondary | ICD-10-CM

## 2018-04-16 LAB — POCT GLYCOSYLATED HEMOGLOBIN (HGB A1C): Hemoglobin A1C: 8.2 % — AB (ref 4.0–5.6)

## 2018-04-16 MED ORDER — FREESTYLE LITE DEVI: 1.0000 | Freq: Once | 0 refills | Status: AC

## 2018-04-16 NOTE — Patient Instructions (Addendum)
I have sent a prescription to your pharmacy, for a new meter. check your blood sugar twice a day.  vary the time of day when you check, between before the 3 meals, and at bedtime.  also check if you have symptoms of your blood sugar being too high or too low.  please keep a record of the readings and bring it to your next appointment here (or you can bring the meter itself).  You can write it on any piece of paper.  please call us sooner if your blood sugar goes below 70, or if you have a lot of readings over 200. Please continue the same insulins for now. Please come back for a follow-up appointment in 1 month.   He enviado una receta a su farmacia, para un nuevo medidor. controle su nivel de azcar en [place]. vare la hora del da cuando realiza la verificacin, entre antes de las 3 comidas y antes de [place]. Tambin verifique si tiene sntomas de que su nivel de azcar en la sangre es demasiado alto o [place]. mantenga un registro de las lecturas y Air cabin crew a su prxima cita aqu (o puede traer Nature conservation officer). Puedes escribirlo en cualquier hoja de papel. llmenos antes si su nivel de azcar en la sangre est por debajo de 70, o si tiene muchas lecturas por encima de 200. Por favor continen las mismas insulinas por ahora. Regrese para una cita de seguimiento en 1 mes.

## 2018-04-16 NOTE — Progress Notes (Signed)
Subjective:    Patient ID: Cynthia [name], female    DOB: [birth year], 47 y.o.   MRN: [id number]  HPI Pt returns for f/u of diabetes mellitus: DM type: Insulin-requiring type 2. Dx'ed: 4818 Complications: DR, CAD, and ESRD (transplant 2017) Therapy: insulin since soon after dx GDM: 1990 DKA: never Severe hypoglycemia: last episode was mid-2019 Pancreatitis: never Pancreatic imaging: normal on 2017 CT Other: she declined pump rx, due to high copay; she is on multiple daily injections now, but due to h/o noncompliance, she may need to change to qd insulin Interval history: Pt says she misses just 2 doses per week.  pt states she feels well in general.  She has lost her freestyle meter.   Past Medical History:  Diagnosis Date  . Anemia    when on dialysis  . Anxiety   . Depression   . ESRD on hemodialysis ([place])    Home HD 5x per week- not on dialysis now had tramsplant 4/17  . GERD (gastroesophageal reflux disease)   . History of blood transfusion    transfusion reaction  . Hypertension   . Insulin-dependent diabetes mellitus with renal complications ([place])    Type I beginning now type II per pt-dr levy also II  . Kidney transplant recipient   . Neuromuscular disorder ([place])    NEUROPATHY    Past Surgical History:  Procedure Laterality Date  . ABDOMINAL HYSTERECTOMY    . BREAST LUMPECTOMY WITH RADIOACTIVE SEED LOCALIZATION Right 09/14/2015   Procedure: RIGHT BREAST LUMPECTOMY WITH RADIOACTIVE SEED LOCALIZATION;  Surgeon: Donnie Mesa, MD;  Location: [place];  Service: General;  Laterality: Right;  . BREAST SURGERY Bilateral    biopsy bilateral  . CATARACT EXTRACTION Bilateral    bilateral  . CHOLECYSTECTOMY    . CYST REMOVAL NECK    . DIALYSIS FISTULA CREATION Left   . EYE SURGERY Bilateral    lazer  . LEFT HEART CATHETERIZATION WITH CORONARY ANGIOGRAM N/A 03/30/2014   Procedure: LEFT HEART CATHETERIZATION WITH CORONARY ANGIOGRAM;  Surgeon: Sinclair Grooms, MD;  Location: [facility] CATH LAB;  Service: Cardiovascular;  Laterality: N/A;  . LIPOMA EXCISION N/A 02/06/2017   Procedure: EXCISION POSTERIOR NECK SEBACEOUS CYST;  Surgeon: Coralie Keens, MD;  Location: [place];  Service: General;  Laterality: N/A;  . 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   Procedure: REPAIR OF ARTERIOVENOUS FISTULA ANEURYSM;  Surgeon: Serafina [place], MD;  Location: [place];  Service: Vascular;  Laterality: Left;  . REVISON OF ARTERIOVENOUS FISTULA Left 09/28/2013   Procedure: EXCISE ESCHAR LEFT ARM  ARTERIOVENOUS FISTULA WITH PLICATION OF LEFT ARM ARTERIOVENOUS FISTULA;  Surgeon: Elam Dutch, MD;  Location: [place];  Service: Vascular;  Laterality: Left;  . REVISON OF ARTERIOVENOUS FISTULA Left 08/26/2015   Procedure: RESECTION ANEURYSM OF LEFT ARM ARTERIOVENOUS FISTULA  ;  Surgeon: Serafina [facility], MD;  Location: [place];  Service: Vascular;  Laterality: Left;  . TUBAL LIGATION      Social History   Socioeconomic History  . Marital status: Legally Separated    Spouse name: Not on file  . Number of children: 3  . Years of education: 9  . Highest education level: Not on file  Occupational History  . Occupation: disabled  Social Needs  . Financial resource strain: Not on file  . Food insecurity:    Worry: Not on file    Inability: Not on file  . Transportation needs:    Medical: Not  on file    Non-medical: Not on file  Tobacco Use  . Smoking status: Never Smoker  . Smokeless tobacco: Never Used  Substance and Sexual Activity  . Alcohol use: No  . Drug use: No  . Sexual activity: Yes  Lifestyle  . Physical activity:    Days per week: Not on file    Minutes per session: Not on file  . Stress: Not on file  Relationships  . Social connections:    Talks on phone: Not on file    Gets together: Not on file    Attends religious service: Not on file    Active member of club or organization: Not on file    Attends meetings of clubs or  organizations: Not on file    Relationship status: Not on file  . Intimate partner violence:    Fear of current or ex partner: Not on file    Emotionally abused: Not on file    Physically abused: Not on file    Forced sexual activity: Not on file  Other Topics Concern  . Not on file  Social History Narrative   Lives in home with daughters, grand daughter, son-in-law   Caffeine use - 1 cup coffee sometimes     Current Outpatient Medications on File Prior to Visit  Medication Sig Dispense Refill  . amLODipine (NORVASC) 5 MG tablet Take by mouth.    Marland Kitchen aspirin EC 81 MG EC tablet Take 1 tablet (81 mg total) by mouth daily. 90 tablet 3  . Biotin 10 MG CAPS Take 10 mg by mouth daily.     . carvedilol (COREG) 12.5 MG tablet Take by mouth.    . cetirizine (ZYRTEC) 10 MG tablet Take 5 mg by mouth daily. Takes 0.5 tablet daily    . Cholecalciferol (VITAMIN D3) 1000 units CAPS Take 1,000 Units by mouth daily.     . cloNIDine (CATAPRES) 0.1 MG tablet Take 2 tablets (0.2 mg total) by mouth 2 (two) times daily. (Patient taking differently: Take 0.1 mg by mouth daily. ) 10 tablet 0  . docusate sodium (COLACE) 100 MG capsule Take 100 mg by mouth 2 (two) times daily.    . fludrocortisone (FLORINEF) 0.1 MG tablet Take 0.1 mg by mouth daily.    Marland Kitchen gabapentin (NEURONTIN) 300 MG capsule Take 300 mg by mouth at bedtime.  12  . glucose blood (FREESTYLE LITE) test strip 1 each by Other route 2 (two) times daily. And lancets 2/day 100 each 12  . Insulin Glargine (LANTUS SOLOSTAR) 100 UNIT/ML Solostar Pen Inject 14 Units into the skin at bedtime. (Patient taking differently: Inject 14 Units into the skin at bedtime. 16 units daily) 15 mL 11  . insulin lispro (HUMALOG KWIKPEN) 100 UNIT/ML KiwkPen Inject 0.07 mLs (7 Units total) into the skin 3 (three) times daily with meals. And pen needles 4/day 15 mL 11  . magnesium oxide (MAG-OX) 400 MG tablet TAKE 1 TABLET(400 MG) BY MOUTH TWICE DAILY    . MAGNESIUM-OXIDE 400  (241.3 Mg) MG tablet Take 400 mg by mouth daily.   11  . mycophenolate (MYFORTIC) 180 MG EC tablet Take 360 mg by mouth 2 (two) times daily.    . mycophenolate (MYFORTIC) 360 MG TBEC EC tablet Take 720 mg by mouth 2 (two) times daily.    . pantoprazole (PROTONIX) 40 MG tablet Take 40 mg by mouth daily.  5  . polyethylene glycol powder (GLYCOLAX/MIRALAX) powder MIX 17 GRAMS WITH LIQUID AND TAKE  PO DAILY  0  . predniSONE (DELTASONE) 5 MG tablet TAKE 1 TABLET DAILY  2  . rosuvastatin (CRESTOR) 5 MG tablet Take 5 mg by mouth at bedtime.    . sodium bicarbonate 650 MG tablet Take 1 tablet (650 mg total) by mouth 3 (three) times daily. (Patient taking differently: Take 650 mg by mouth 2 (two) times daily. ) 90 tablet 1  . tacrolimus (PROGRAF) 1 MG capsule 2 mg at 9 AM, 1 mg at 9 PM---Z94.0     No current facility-administered medications on file prior to visit.     Allergies  Allergen Reactions  . Other Rash    Burns skin.Please use paper tape only Burns skin.Please use paper tape only  . Tape Other (See Comments)    Burns skin.  Please use paper tape only    Family History  Problem Relation Age of Onset  . Cancer Maternal Grandmother   . Diabetes Maternal Grandfather   . Diabetes Paternal Grandmother   . Diabetes Mother   . Kidney disease Mother   . Diabetes Father   . Heart disease Father   . Kidney disease Father   . Diabetes Sister   . Asthma Sister   . Lupus Sister   . Diabetes Brother     BP (!) 162/88   Pulse 72   Ht 5\' 1"  (1.549 m)   Wt 180 lb (81.6 kg)   SpO2 98%   BMI 34.01 kg/m   Review of Systems Denies LOC.      Objective:   Physical Exam VITAL SIGNS:  See vs page GENERAL: no distress Pulses: dorsalis pedis intact bilat.   MSK: no deformity of the feet CV: 1+ bilat leg edema Skin:  no ulcer on the feet.  normal color and temp on the feet. Neuro: sensation is intact to touch on the feet  Lab Results  Component Value Date   CREATININE 1.27 (H)  02/04/2017   BUN 23 (H) 02/04/2017   NA 138 02/04/2017   K 4.2 02/04/2017   CL 106 02/04/2017   CO2 27 02/04/2017    Lab Results  Component Value Date   HGBA1C 8.2 (A) 04/16/2018       Assessment & Plan:  Insulin-requiring type 2 DM, with CAD: she needs increased rx Noncompliance with cbg recording: she may need to change to qd insulin Renal failure: in this setting, she needs mostly mealtime insulin  Patient Instructions   I have sent a prescription to your pharmacy, for a new meter. check your blood sugar twice a day.  vary the time of day when you check, between before the 3 meals, and at bedtime.  also check if you have symptoms of your blood sugar being too high or too low.  please keep a record of the readings and bring it to your next appointment here (or you can bring the meter itself).  You can write it on any piece of paper.  please call us sooner if your blood sugar goes below 70, or if you have a lot of readings over 200. Please continue the same insulins for now. Please come back for a follow-up appointment in 1 month.   He enviado una receta a su farmacia, para un nuevo medidor. controle su nivel de azcar en [place]. vare la hora del da cuando realiza la verificacin, entre antes de las 3 comidas y antes de [place]. Tambin verifique si tiene sntomas de que su nivel de  azcar en la sangre es demasiado alto o demasiado bajo. mantenga un registro de las lecturas y Air cabin crew a su prxima cita aqu (o puede traer Nature conservation officer). Puedes escribirlo en cualquier hoja de papel. llmenos antes si su nivel de azcar en la sangre est por debajo de 70, o si tiene muchas lecturas por encima de 200. Por favor continen las mismas insulinas por ahora. Regrese para una cita de seguimiento en 1 mes.

## 2018-04-19 MED ORDER — INSULIN GLARGINE 100 UNIT/ML SOLOSTAR PEN: 14.0000 [IU] | PEN_INJECTOR | Freq: Every day | SUBCUTANEOUS | 11 refills | Status: DC

## 2018-04-22 ENCOUNTER — Other Ambulatory Visit: Payer: Self-pay

## 2018-05-27 ENCOUNTER — Ambulatory Visit (INDEPENDENT_AMBULATORY_CARE_PROVIDER_SITE_OTHER): Payer: Medicare Other | Admitting: Endocrinology

## 2018-05-27 ENCOUNTER — Encounter: Payer: Self-pay | Admitting: Endocrinology

## 2018-05-27 VITALS — BP 128/68 | HR 67 | Ht 61.0 in | Wt 179.0 lb

## 2018-05-27 DIAGNOSIS — IMO0001 Reserved for inherently not codable concepts without codable children: Secondary | ICD-10-CM

## 2018-05-27 DIAGNOSIS — E1129 Type 2 diabetes mellitus with other diabetic kidney complication: Secondary | ICD-10-CM | POA: Diagnosis not present

## 2018-05-27 DIAGNOSIS — Z794 Long term (current) use of insulin: Secondary | ICD-10-CM

## 2018-05-27 MED ORDER — GLUCOSE BLOOD VI STRP: 1.0000 | ORAL_STRIP | Freq: Two times a day (BID) | 12 refills | Status: DC

## 2018-05-27 NOTE — Progress Notes (Signed)
Subjective:    Patient ID: Cynthia [name], female    DOB: 1970/11/21, 47 y.o.   MRN: [id number]  HPI Pt returns for f/u of diabetes mellitus: DM type: Insulin-requiring type 2. Dx'ed: 8182 Complications: DR, CAD, and ESRD (transplant 2017) Therapy: insulin since soon after dx GDM: 1990 DKA: never Severe hypoglycemia: last episode was mid-2019 Pancreatitis: never Pancreatic imaging: normal on 2017 CT Other: she declined pump rx, due to high copay; she is on multiple daily injections now, but due to h/o noncompliance, she may need to change to qd insulin Interval history: pt states she feels well in general.  Pt says she does not miss the insulin.  However, medicare is not paying for this brand of strips. She has intermitt tremor, in the afternoon, or at HS.   Past Medical History:  Diagnosis Date  . Anemia    when on dialysis  . Anxiety   . Depression   . ESRD on hemodialysis ([place])    Home HD 5x per week- not on dialysis now had tramsplant 4/17  . GERD (gastroesophageal reflux disease)   . History of blood transfusion    transfusion reaction  . Hypertension   . Insulin-dependent diabetes mellitus with renal complications ([place])    Type I beginning now type II per pt-dr levy also II  . Kidney transplant recipient   . Neuromuscular disorder ([place])    NEUROPATHY    Past Surgical History:  Procedure Laterality Date  . ABDOMINAL HYSTERECTOMY    . BREAST LUMPECTOMY WITH RADIOACTIVE SEED LOCALIZATION Right 09/14/2015   Procedure: RIGHT BREAST LUMPECTOMY WITH RADIOACTIVE SEED LOCALIZATION;  Surgeon: Donnie Mesa, MD;  Location: [place];  Service: General;  Laterality: Right;  . BREAST SURGERY Bilateral    biopsy bilateral  . CATARACT EXTRACTION Bilateral    bilateral  . CHOLECYSTECTOMY    . CYST REMOVAL NECK    . DIALYSIS FISTULA CREATION Left   . EYE SURGERY Bilateral    lazer  . LEFT HEART CATHETERIZATION WITH CORONARY ANGIOGRAM N/A 03/30/2014   Procedure: LEFT HEART CATHETERIZATION WITH CORONARY ANGIOGRAM;  Surgeon: Sinclair Grooms, MD;  Location: [facility] CATH LAB;  Service: Cardiovascular;  Laterality: N/A;  . LIPOMA EXCISION N/A 02/06/2017   Procedure: EXCISION POSTERIOR NECK SEBACEOUS CYST;  Surgeon: Coralie Keens, MD;  Location: [place];  Service: General;  Laterality: N/A;  . 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   Procedure: REPAIR OF ARTERIOVENOUS FISTULA ANEURYSM;  Surgeon: Serafina [place], MD;  Location: [place];  Service: Vascular;  Laterality: Left;  . REVISON OF ARTERIOVENOUS FISTULA Left 09/28/2013   Procedure: EXCISE ESCHAR LEFT ARM  ARTERIOVENOUS FISTULA WITH PLICATION OF LEFT ARM ARTERIOVENOUS FISTULA;  Surgeon: Elam Dutch, MD;  Location: [place];  Service: Vascular;  Laterality: Left;  . REVISON OF ARTERIOVENOUS FISTULA Left 08/26/2015   Procedure: RESECTION ANEURYSM OF LEFT ARM ARTERIOVENOUS FISTULA  ;  Surgeon: Serafina [facility], MD;  Location: [place];  Service: Vascular;  Laterality: Left;  . TUBAL LIGATION      Social History   Socioeconomic History  . Marital status: Legally Separated    Spouse name: Not on file  . Number of children: 3  . Years of education: 9  . Highest education level: Not on file  Occupational History  . Occupation: disabled  Social Needs  . Financial resource strain: Not on file  . Food insecurity:    Worry: Not on file    Inability:  Not on file  . Transportation needs:    Medical: Not on file    Non-medical: Not on file  Tobacco Use  . Smoking status: Never Smoker  . Smokeless tobacco: Never Used  Substance and Sexual Activity  . Alcohol use: No  . Drug use: No  . Sexual activity: Yes  Lifestyle  . Physical activity:    Days per week: Not on file    Minutes per session: Not on file  . Stress: Not on file  Relationships  . Social connections:    Talks on phone: Not on file    Gets together: Not on file    Attends religious service: Not on file    Active  member of club or organization: Not on file    Attends meetings of clubs or organizations: Not on file    Relationship status: Not on file  . Intimate partner violence:    Fear of current or ex partner: Not on file    Emotionally abused: Not on file    Physically abused: Not on file    Forced sexual activity: Not on file  Other Topics Concern  . Not on file  Social History Narrative   Lives in home with daughters, grand daughter, son-in-law   Caffeine use - 1 cup coffee sometimes     Current Outpatient Medications on File Prior to Visit  Medication Sig Dispense Refill  . amLODipine (NORVASC) 5 MG tablet Take by mouth.    Marland Kitchen aspirin EC 81 MG EC tablet Take 1 tablet (81 mg total) by mouth daily. 90 tablet 3  . Biotin 10 MG CAPS Take 10 mg by mouth daily.     . carvedilol (COREG) 12.5 MG tablet Take by mouth.    . cetirizine (ZYRTEC) 10 MG tablet Take 5 mg by mouth daily. Takes 0.5 tablet daily    . Cholecalciferol (VITAMIN D3) 1000 units CAPS Take 1,000 Units by mouth daily.     . cloNIDine (CATAPRES) 0.1 MG tablet Take 2 tablets (0.2 mg total) by mouth 2 (two) times daily. (Patient taking differently: Take 0.1 mg by mouth daily. ) 10 tablet 0  . docusate sodium (COLACE) 100 MG capsule Take 100 mg by mouth 2 (two) times daily.    . fludrocortisone (FLORINEF) 0.1 MG tablet Take 0.1 mg by mouth daily.    Marland Kitchen gabapentin (NEURONTIN) 300 MG capsule Take 300 mg by mouth at bedtime.  12  . Insulin Glargine (LANTUS SOLOSTAR) 100 UNIT/ML Solostar Pen Inject 14 Units into the skin at bedtime. 5 pen 11  . insulin lispro (HUMALOG KWIKPEN) 100 UNIT/ML KiwkPen Inject 0.07 mLs (7 Units total) into the skin 3 (three) times daily with meals. And pen needles 4/day 15 mL 11  . magnesium oxide (MAG-OX) 400 MG tablet TAKE 1 TABLET(400 MG) BY MOUTH TWICE DAILY    . MAGNESIUM-OXIDE 400 (241.3 Mg) MG tablet Take 400 mg by mouth daily.   11  . mycophenolate (MYFORTIC) 180 MG EC tablet Take 360 mg by mouth 2 (two)  times daily.    . mycophenolate (MYFORTIC) 360 MG TBEC EC tablet Take 720 mg by mouth 2 (two) times daily.    . pantoprazole (PROTONIX) 40 MG tablet Take 40 mg by mouth daily.  5  . predniSONE (DELTASONE) 5 MG tablet TAKE 1 TABLET DAILY  2  . rosuvastatin (CRESTOR) 5 MG tablet Take 5 mg by mouth at bedtime.    . tacrolimus (PROGRAF) 1 MG capsule 2 mg at  9 AM, 1 mg at 9 PM---Z94.0    . polyethylene glycol powder (GLYCOLAX/MIRALAX) powder MIX 17 GRAMS WITH LIQUID AND TAKE PO DAILY  0  . sodium bicarbonate 650 MG tablet Take 1 tablet (650 mg total) by mouth 3 (three) times daily. (Patient not taking: Reported on 05/27/2018) 90 tablet 1   No current facility-administered medications on file prior to visit.     Allergies  Allergen Reactions  . Other Rash    Burns skin.Please use paper tape only Burns skin.Please use paper tape only  . Tape Other (See Comments)    Burns skin.  Please use paper tape only    Family History  Problem Relation Age of Onset  . Cancer Maternal Grandmother   . Diabetes Maternal Grandfather   . Diabetes Paternal Grandmother   . Diabetes Mother   . Kidney disease Mother   . Diabetes Father   . Heart disease Father   . Kidney disease Father   . Diabetes Sister   . Asthma Sister   . Lupus Sister   . Diabetes Brother     BP 128/68 (BP Location: Right Arm, Patient Position: Sitting, Cuff Size: Normal)   Pulse 67   Ht 5\' 1"  (1.549 m)   Wt 179 lb (81.2 kg)   SpO2 96%   BMI 33.82 kg/m    Review of Systems Denies LOC.       Objective:   Physical Exam VITAL SIGNS:  See vs page GENERAL: no distress Pulses: dorsalis pedis intact bilat.   MSK: no deformity of the feet CV: no leg edema Skin:  no ulcer on the feet.  normal color and temp on the feet. Neuro: sensation is intact to touch on the feet  Lab Results  Component Value Date   HGBA1C 8.2 (A) 04/16/2018      Assessment & Plan:  Insulin-requiring type 2 DM, with ESRD/transplant: she needs  increased rx.  Tremor, new: we need to see if this is hypoglycemia, before increasing insulin.   Patient Instructions  Here is a new meter.  I have sent a prescription to your pharmacy, for strips. check your blood sugar twice a day.  vary the time of day when you check, between before the 3 meals, and at bedtime.  also check if you have symptoms of your blood sugar being too high or too low.  please keep a record of the readings and bring it to your next appointment here (or you can bring the meter itself).  You can write it on any piece of paper.  please call us sooner if your blood sugar goes below 70, or if you have a lot of readings over 200. Please continue the same insulins for now.  Please come back for a follow-up appointment in 6 weeks.     Aqu hay un nuevo medidor. He enviado una receta a su farmacia, por tiras. controle su nivel de azcar en [place]. vare la hora del da cuando realiza la verificacin, entre antes de las 3 comidas y antes de [place]. Tambin verifique si tiene sntomas de que su nivel de azcar en la sangre es demasiado alto o [place]. mantenga un registro de las lecturas y Air cabin crew a su prxima cita aqu (o puede traer Nature conservation officer). Puedes escribirlo en cualquier hoja de papel. llmenos antes si su nivel de azcar en la sangre est por debajo de 70, o si tiene muchas lecturas por encima de 200. Por favor  continen las mismas insulinas por ahora. Regrese para una cita de seguimiento en 6 semanas.

## 2018-05-27 NOTE — Patient Instructions (Addendum)
Here is a new meter.  I have sent a prescription to your pharmacy, for strips. check your blood sugar twice a day.  vary the time of day when you check, between before the 3 meals, and at bedtime.  also check if you have symptoms of your blood sugar being too high or too low.  please keep a record of the readings and bring it to your next appointment here (or you can bring the meter itself).  You can write it on any piece of paper.  please call us sooner if your blood sugar goes below 70, or if you have a lot of readings over 200. Please continue the same insulins for now.  Please come back for a follow-up appointment in 6 weeks.     Aqu hay un nuevo medidor. He enviado una receta a su farmacia, por tiras. controle su nivel de azcar en [place]. vare la hora del da cuando realiza la verificacin, entre antes de las 3 comidas y antes de [place]. Tambin verifique si tiene sntomas de que su nivel de azcar en la sangre es demasiado alto o [place]. mantenga un registro de las lecturas y Air cabin crew a su prxima cita aqu (o puede traer Nature conservation officer). Puedes escribirlo en cualquier hoja de papel. llmenos antes si su nivel de azcar en la sangre est por debajo de 70, o si tiene muchas lecturas por encima de 200. Por favor continen las mismas insulinas por ahora. Regrese para una cita de seguimiento en 6 semanas.

## 2018-06-23 ENCOUNTER — Telehealth: Payer: Self-pay | Admitting: Endocrinology

## 2018-06-23 ENCOUNTER — Other Ambulatory Visit: Payer: Self-pay

## 2018-06-23 DIAGNOSIS — E1129 Type 2 diabetes mellitus with other diabetic kidney complication: Principal | ICD-10-CM

## 2018-06-23 DIAGNOSIS — IMO0001 Reserved for inherently not codable concepts without codable children: Secondary | ICD-10-CM

## 2018-06-23 DIAGNOSIS — Z794 Long term (current) use of insulin: Principal | ICD-10-CM

## 2018-06-23 MED ORDER — GLUCOSE BLOOD VI STRP: 1.0000 | ORAL_STRIP | Freq: Two times a day (BID) | 12 refills | Status: DC

## 2018-06-23 NOTE — Telephone Encounter (Signed)
Rx was sent 06/02/18 to [facility] has been re-sent to [facility] [place]. Confirmation received.

## 2018-06-23 NOTE — Telephone Encounter (Signed)
MEDICATION: test strips  PHARMACY:  [facility] on [address]  IS THIS A 90 DAY SUPPLY : y  IS PATIENT OUT OF MEDICATION: y  IF NOT; HOW MUCH IS LEFT: 0  LAST APPOINTMENT DATE: @12 /08/2017  NEXT APPOINTMENT DATE:@1 /14/2020  DO WE HAVE YOUR PERMISSION TO LEAVE A DETAILED MESSAGE:  OTHER COMMENTS: the rx we called in on 05/27/18 is not covered by insurance they are unsure of what is covered but she is out and needs some strips   **Let patient know to contact pharmacy at the end of the day to make sure medication is ready. **  ** Please notify patient to allow 48-72 hours to process**  **Encourage patient to contact the pharmacy for refills or they can request refills through [facility]**

## 2018-07-08 ENCOUNTER — Ambulatory Visit: Payer: Medicare Other | Admitting: Endocrinology

## 2018-11-11 ENCOUNTER — Other Ambulatory Visit: Payer: Self-pay

## 2018-11-12 ENCOUNTER — Ambulatory Visit (INDEPENDENT_AMBULATORY_CARE_PROVIDER_SITE_OTHER): Payer: Medicare Other | Admitting: Endocrinology

## 2018-11-12 ENCOUNTER — Other Ambulatory Visit: Payer: Self-pay

## 2018-11-12 ENCOUNTER — Encounter: Payer: Self-pay | Admitting: Endocrinology

## 2018-11-12 VITALS — BP 130/60 | HR 73 | Ht 61.0 in | Wt 184.6 lb

## 2018-11-12 DIAGNOSIS — IMO0001 Reserved for inherently not codable concepts without codable children: Secondary | ICD-10-CM

## 2018-11-12 DIAGNOSIS — E1129 Type 2 diabetes mellitus with other diabetic kidney complication: Secondary | ICD-10-CM

## 2018-11-12 DIAGNOSIS — Z794 Long term (current) use of insulin: Secondary | ICD-10-CM

## 2018-11-12 LAB — POCT GLYCOSYLATED HEMOGLOBIN (HGB A1C): Hemoglobin A1C: 7.8 % — AB (ref 4.0–5.6)

## 2018-11-12 MED ORDER — GLUCOSE BLOOD VI STRP: 1.0000 | ORAL_STRIP | Freq: Two times a day (BID) | 12 refills | Status: DC

## 2018-11-12 MED ORDER — INSULIN LISPRO (1 UNIT DIAL) 100 UNIT/ML (KWIKPEN): 9.0000 [IU] | PEN_INJECTOR | Freq: Three times a day (TID) | SUBCUTANEOUS | 11 refills | Status: DC

## 2018-11-12 MED ORDER — INSULIN GLARGINE 100 UNIT/ML SOLOSTAR PEN: 10.0000 [IU] | PEN_INJECTOR | Freq: Every day | SUBCUTANEOUS | 11 refills | Status: DC

## 2018-11-12 NOTE — Progress Notes (Signed)
Subjective:    Patient ID: Cynthia [name], female    DOB: June 28, 1970, 48 y.o.   MRN: [id number]  HPI Pt returns for f/u of diabetes mellitus: DM type: Insulin-requiring type 2. Dx'ed: 8937 Complications: DR, CAD, and ESRD (transplant 2017) Therapy: insulin since soon after dx GDM: 1990 DKA: never Severe hypoglycemia: last episode was mid-2019 Pancreatitis: never Pancreatic imaging: normal on 2017 CT Other: she declined pump rx, due to high copay; she is on multiple daily injections now, but due to h/o noncompliance, she may need to change to qd insulin Interval history: pt states she feels well in general.  Pt says she seldom misses the insulin.  She says cbg varies from 56-243.   Past Medical History:  Diagnosis Date  . Anemia    when on dialysis  . Anxiety   . Depression   . ESRD on hemodialysis ([place])    Home HD 5x per week- not on dialysis now had tramsplant 4/17  . GERD (gastroesophageal reflux disease)   . History of blood transfusion    transfusion reaction  . Hypertension   . Insulin-dependent diabetes mellitus with renal complications ([place])    Type I beginning now type II per pt-dr levy also II  . Kidney transplant recipient   . Neuromuscular disorder ([place])    NEUROPATHY    Past Surgical History:  Procedure Laterality Date  . ABDOMINAL HYSTERECTOMY    . BREAST LUMPECTOMY WITH RADIOACTIVE SEED LOCALIZATION Right 09/14/2015   Procedure: RIGHT BREAST LUMPECTOMY WITH RADIOACTIVE SEED LOCALIZATION;  Surgeon: Donnie Mesa, MD;  Location: [facility];  Service: General;  Laterality: Right;  . BREAST SURGERY Bilateral    biopsy bilateral  . CATARACT EXTRACTION Bilateral    bilateral  . CHOLECYSTECTOMY    . CYST REMOVAL NECK    . DIALYSIS FISTULA CREATION Left   . EYE SURGERY Bilateral    lazer  . LEFT HEART CATHETERIZATION WITH CORONARY ANGIOGRAM N/A 03/30/2014   Procedure: LEFT HEART CATHETERIZATION WITH CORONARY ANGIOGRAM;  Surgeon: Sinclair Grooms, MD;  Location: [facility] CATH LAB;  Service: Cardiovascular;  Laterality: N/A;  . LIPOMA EXCISION N/A 02/06/2017   Procedure: EXCISION POSTERIOR NECK SEBACEOUS CYST;  Surgeon: Coralie Keens, MD;  Location: [place] [facility];  Service: General;  Laterality: N/A;  . 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 REVISON OF ARTERIOVENOUS FISTULA Left 09/28/2013   Procedure: EXCISE ESCHAR LEFT ARM  ARTERIOVENOUS FISTULA WITH PLICATION OF LEFT ARM ARTERIOVENOUS FISTULA;  Surgeon: Elam Dutch, MD;  Location: [place];  Service: Vascular;  Laterality: Left;  . REVISON OF ARTERIOVENOUS FISTULA Left 08/26/2015   Procedure: RESECTION ANEURYSM OF LEFT ARM ARTERIOVENOUS FISTULA  ;  Surgeon: Serafina Mitchell, MD;  Location: [place];  Service: Vascular;  Laterality: Left;  . TUBAL LIGATION      Social History   Socioeconomic History  . Marital status: Legally Separated    Spouse name: Not on file  . Number of children: 3  . Years of education: 9  . Highest education level: Not on file  Occupational History  . Occupation: disabled  Social Needs  . Financial resource strain: Not on file  . Food insecurity:    Worry: Not on file    Inability: Not on file  . Transportation needs:    Medical: Not on file  Non-medical: Not on file  Tobacco Use  . Smoking status: Never Smoker  . Smokeless tobacco: Never Used  Substance and Sexual Activity  . Alcohol use: No  . Drug use: No  . Sexual activity: Yes  Lifestyle  . Physical activity:    Days per week: Not on file    Minutes per session: Not on file  . Stress: Not on file  Relationships  . Social connections:    Talks on phone: Not on file    Gets together: Not on file    Attends religious service: Not on file    Active member of club or organization: Not on file    Attends meetings of clubs or  organizations: Not on file    Relationship status: Not on file  . Intimate partner violence:    Fear of current or ex partner: Not on file    Emotionally abused: Not on file    Physically abused: Not on file    Forced sexual activity: Not on file  Other Topics Concern  . Not on file  Social History Narrative   Lives in home with daughters, grand daughter, son-in-law   Caffeine use - 1 cup coffee sometimes     Current Outpatient Medications on File Prior to Visit  Medication Sig Dispense Refill  . amLODipine (NORVASC) 5 MG tablet Take by mouth.    Marland Kitchen aspirin EC 81 MG EC tablet Take 1 tablet (81 mg total) by mouth daily. 90 tablet 3  . Biotin 10 MG CAPS Take 10 mg by mouth daily.     . carvedilol (COREG) 12.5 MG tablet Take by mouth.    . cetirizine (ZYRTEC) 10 MG tablet Take 5 mg by mouth daily. Takes 0.5 tablet daily    . Cholecalciferol (VITAMIN D3) 1000 units CAPS Take 1,000 Units by mouth daily.     . cloNIDine (CATAPRES) 0.1 MG tablet Take 2 tablets (0.2 mg total) by mouth 2 (two) times daily. (Patient taking differently: Take 0.1 mg by mouth daily. ) 10 tablet 0  . docusate sodium (COLACE) 100 MG capsule Take 100 mg by mouth 2 (two) times daily.    . fludrocortisone (FLORINEF) 0.1 MG tablet Take 0.1 mg by mouth daily.    Marland Kitchen gabapentin (NEURONTIN) 300 MG capsule Take 300 mg by mouth at bedtime.  12  . magnesium oxide (MAG-OX) 400 MG tablet TAKE 1 TABLET(400 MG) BY MOUTH TWICE DAILY    . MAGNESIUM-OXIDE 400 (241.3 Mg) MG tablet Take 400 mg by mouth daily.   11  . mycophenolate (MYFORTIC) 180 MG EC tablet Take 360 mg by mouth 2 (two) times daily.    . mycophenolate (MYFORTIC) 360 MG TBEC EC tablet Take 720 mg by mouth 2 (two) times daily.    . pantoprazole (PROTONIX) 40 MG tablet Take 40 mg by mouth daily.  5  . polyethylene glycol powder (GLYCOLAX/MIRALAX) powder MIX 17 GRAMS WITH LIQUID AND TAKE PO DAILY  0  . predniSONE (DELTASONE) 5 MG tablet TAKE 1 TABLET DAILY  2  .  rosuvastatin (CRESTOR) 5 MG tablet Take 5 mg by mouth at bedtime.    . sodium bicarbonate 650 MG tablet Take 1 tablet (650 mg total) by mouth 3 (three) times daily. 90 tablet 1  . tacrolimus (PROGRAF) 1 MG capsule 2 mg at 9 AM, 1 mg at 9 PM---Z94.0     No current facility-administered medications on file prior to visit.     Allergies  Allergen Reactions  .  Other Rash    Burns skin.Please use paper tape only Burns skin.Please use paper tape only  . Tape Other (See Comments)    Burns skin.  Please use paper tape only    Family History  Problem Relation Age of Onset  . Cancer Maternal Grandmother   . Diabetes Maternal Grandfather   . Diabetes Paternal Grandmother   . Diabetes Mother   . Kidney disease Mother   . Diabetes Father   . Heart disease Father   . Kidney disease Father   . Diabetes Sister   . Asthma Sister   . Lupus Sister   . Diabetes Brother     BP 130/60 (BP Location: Left Arm, Patient Position: Sitting, Cuff Size: Large)   Pulse 73   Ht 5\' 1"  (1.549 m)   Wt 184 lb 9.6 oz (83.7 kg)   SpO2 95%   BMI 34.88 kg/m    Review of Systems Denies LOC    Objective:   Physical Exam VITAL SIGNS:  See vs page GENERAL: no distress Pulses: dorsalis pedis intact bilat.   MSK: no deformity of the feet.   CV: no leg edema.  Skin:  no ulcer on the feet.  normal color and temp on the feet.  Neuro: sensation is intact to touch on the feet.   A1c=7.8%     Assessment & Plan:  Insulin-requiring type 2 DM, with CAD: Based on the pattern of her cbg's, she needs some adjustment in her therapy Renal failure: in this context, she needs mostly mealtime insulin Hypoglycemia: This limits aggressiveness of glycemic control   Patient Instructions  check your blood sugar twice a day.  vary the time of day when you check, between before the 3 meals, and at bedtime.  also check if you have symptoms of your blood sugar being too high or too low.  please keep a record of the  readings and bring it to your next appointment here (or you can bring the meter itself).  You can write it on any piece of paper.  please call us sooner if your blood sugar goes below 70, or if you have a lot of readings over 200. Please decrease the lantus to 10 units at bedtime, and: Increase humalog to 9 units 3 times a day (just before each meal).   Please come back for a follow-up appointment in 2-3 months.

## 2018-11-12 NOTE — Patient Instructions (Addendum)
check your blood sugar twice a day.  vary the time of day when you check, between before the 3 meals, and at bedtime.  also check if you have symptoms of your blood sugar being too high or too low.  please keep a record of the readings and bring it to your next appointment here (or you can bring the meter itself).  You can write it on any piece of paper.  please call us sooner if your blood sugar goes below 70, or if you have a lot of readings over 200. Please decrease the lantus to 10 units at bedtime, and: Increase humalog to 9 units 3 times a day (just before each meal).   Please come back for a follow-up appointment in 2-3 months.

## 2018-12-31 ENCOUNTER — Telehealth: Payer: Self-pay | Admitting: *Deleted

## 2018-12-31 NOTE — Telephone Encounter (Signed)
REFERRAL SENT TO SCHEDULING AND NOTES ON FILE FROM [facility]. [name] [phone].

## 2019-02-09 ENCOUNTER — Other Ambulatory Visit: Payer: Self-pay

## 2019-02-10 NOTE — H&P (View-Only) (Signed)
Cardiology Office Note   Date:  02/11/2019   ID:  Cynthia [name], DOB Nov 22, 1970, MRN [id number]  PCP:  Cynthia Mires, MD  Cardiologist:   Derald Lorge Martinique, MD   Chief Complaint  Patient presents with   Chest Pain   Shortness of Breath      History of Present Illness: Cynthia [name] is a 48 y.o. female who is seen at the request of Dr Cynthia [name] for evaluation of decreased exercise tolerance. She has a history of ESRD previously on dialysis but subsequent renal transplant in 2014. Also history of IDDM, HTN, and anemia. She had prior cardiac evaluation in 2015 with cardiac cath showing coronary calcification but widely patent arteries. She had a Nuclear stress test and Echo in 2016 which were normal.   She is seen today with an interpreter. She notes that for the past 8 months she is very tired and when she goes up stairs she gets very weak. She complain of pain in the left parasternal area. This is fairly continuous but waxes and wanes and is worse with activity. She also gets SOB easily. She complain of dyspnea. No dizziness or syncope. Notes some palpitations. Her renal function has been good.     Past Medical History:  Diagnosis Date   Anemia    when on dialysis   Anxiety    Depression    ESRD on hemodialysis (Cynthia [name])    Home HD 5x per week- not on dialysis now had tramsplant 4/17   GERD (gastroesophageal reflux disease)    History of blood transfusion    transfusion reaction   Hyperlipidemia    Hypertension    Insulin-dependent diabetes mellitus with renal complications ([facility])    Type I beginning now type II per pt-dr levy also II   Kidney transplant recipient    Neuromuscular disorder (Cynthia [name])    NEUROPATHY    Past Surgical History:  Procedure Laterality Date   ABDOMINAL HYSTERECTOMY     BREAST LUMPECTOMY WITH RADIOACTIVE SEED LOCALIZATION Right 09/14/2015   Procedure: RIGHT BREAST LUMPECTOMY WITH RADIOACTIVE SEED LOCALIZATION;  Surgeon: Donnie Mesa, MD;   Location: [place];  Service: General;  Laterality: Right;   BREAST SURGERY Bilateral    biopsy bilateral   CATARACT EXTRACTION Bilateral    bilateral   CHOLECYSTECTOMY     CYST REMOVAL NECK     DIALYSIS FISTULA CREATION Left    EYE SURGERY Bilateral    lazer   LEFT HEART CATHETERIZATION WITH CORONARY ANGIOGRAM N/A 03/30/2014   Procedure: LEFT HEART CATHETERIZATION WITH CORONARY ANGIOGRAM;  Surgeon: Sinclair Grooms, MD;  Location: 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   Procedure: REPAIR OF ARTERIOVENOUS FISTULA ANEURYSM;  Surgeon: Serafina Mitchell, MD;  [facility];  Service: Vascular;  Laterality: Left;   REVISON OF ARTERIOVENOUS FISTULA Left 09/28/2013   Procedure: EXCISE ESCHAR LEFT ARM  ARTERIOVENOUS FISTULA WITH PLICATION OF LEFT ARM ARTERIOVENOUS FISTULA;  Surgeon: Elam Dutch, MD;  Location: [place];  Service: Vascular;  Laterality: Left;   REVISON OF ARTERIOVENOUS FISTULA Left 08/26/2015   Procedure: RESECTION ANEURYSM OF LEFT ARM ARTERIOVENOUS FISTULA  ;  Surgeon: Serafina Mitchell, MD;  Location: [place];  Service: Vascular;  Laterality: Left;  TUBAL LIGATION       Current Outpatient Medications  Medication Sig Dispense Refill   amLODipine (NORVASC) 5 MG tablet Take by mouth.     aspirin EC 81 MG EC tablet Take 1 tablet (81 mg total) by mouth daily. 90 tablet 3   Biotin 10 MG CAPS Take 10 mg by mouth daily.      carvedilol (COREG) 12.5 MG tablet Take by mouth.     cetirizine (ZYRTEC) 10 MG tablet Take 5 mg by mouth daily. Takes 0.5 tablet daily     Cholecalciferol (VITAMIN D3) 1000 units CAPS Take 1,000 Units by mouth daily.      cloNIDine (CATAPRES) 0.1 MG tablet Take 2 tablets (0.2 mg  total) by mouth 2 (two) times daily. (Patient taking differently: Take 0.1 mg by mouth daily. ) 10 tablet 0   docusate sodium (COLACE) 100 MG capsule Take 100 mg by mouth 2 (two) times daily.     fludrocortisone (FLORINEF) 0.1 MG tablet Take 0.1 mg by mouth daily.     gabapentin (NEURONTIN) 300 MG capsule Take 300 mg by mouth at bedtime.  12   glucose blood (ONETOUCH VERIO) test strip 1 each by Other route 2 (two) times daily. And lancets+alcohol pads 2/day 100 each 12   Insulin Glargine (LANTUS SOLOSTAR) 100 UNIT/ML Solostar Pen Inject 10 Units into the skin at bedtime. 5 pen 11   insulin lispro (HUMALOG KWIKPEN) 100 UNIT/ML KwikPen Inject 0.09 mLs (9 Units total) into the skin 3 (three) times daily with meals. 15 mL 11   magnesium oxide (MAG-OX) 400 MG tablet TAKE 1 TABLET(400 MG) BY MOUTH TWICE DAILY     MAGNESIUM-OXIDE 400 (241.3 Mg) MG tablet Take 400 mg by mouth daily.   11   mycophenolate (MYFORTIC) 180 MG EC tablet Take 360 mg by mouth 2 (two) times daily.     mycophenolate (MYFORTIC) 360 MG TBEC EC tablet Take 720 mg by mouth 2 (two) times daily.     pantoprazole (PROTONIX) 40 MG tablet Take 40 mg by mouth daily.  5   polyethylene glycol powder (GLYCOLAX/MIRALAX) powder MIX 17 GRAMS WITH LIQUID AND TAKE PO DAILY  0   predniSONE (DELTASONE) 5 MG tablet TAKE 1 TABLET DAILY  2   rosuvastatin (CRESTOR) 5 MG tablet Take 5 mg by mouth at bedtime.     sodium bicarbonate 650 MG tablet Take 1 tablet (650 mg total) by mouth 3 (three) times daily. 90 tablet 1   tacrolimus (PROGRAF) 1 MG capsule 2 mg at 9 AM, 1 mg at 9 PM---Z94.0     No current facility-administered medications for this visit.     Allergies:   Other and Tape    Social History:  The patient  reports that she has never smoked. She has never used smokeless tobacco. She reports that she does not drink alcohol or use drugs.   Family History:  The patient's family history includes Asthma in her sister; Cancer in her  maternal grandmother; Diabetes in her brother, father, maternal grandfather, mother, paternal grandmother, and sister; Heart disease in her father; Kidney disease in her father and mother; Lupus in her sister.    ROS:  Please see the history of present illness.   Otherwise, review of systems are positive for none.   All other systems are reviewed and negative.    PHYSICAL EXAM: VS:  BP (!) 120/52    Pulse 62    Temp (!) 96.9 F (36.1 C)  Ht 5\' 1"  (1.549 m)    Wt 178 lb 6.4 oz (80.9 kg)    SpO2 98%    BMI 33.71 kg/m  , BMI Body mass index is 33.71 kg/m. GEN: Well nourished, overweigth, in no acute distress  HEENT: normal  Neck: no JVD, carotid bruits, or masses Cardiac: RRR; no murmurs, rubs, or gallops,no edema  Respiratory:  clear to auscultation bilaterally, normal work of breathing GI: soft, nontender, nondistended, + BS MS: no deformity or atrophy, there is  Large functioning AV fistula in the left upper arm with continuous murmur and thrill. The murmur radiates throughout her chest Skin: warm and dry, no rash Neuro:  Strength and sensation are intact Psych: euthymic mood, full affect   EKG:  EKG is ordered today. The ekg ordered today demonstrates NSR with normal Ecg. I have personally reviewed and interpreted this study.    Recent Labs: No results found for requested labs within last 8760 hours.    Lipid Panel    Component Value Date/Time   CHOL 162 03/29/2014 0616   TRIG 284 (H) 03/29/2014 0616   HDL 31 (L) 03/29/2014 0616   CHOLHDL 5.2 03/29/2014 0616   VLDL 57 (H) 03/29/2014 0616   LDLCALC 74 03/29/2014 0616    Labs dated 11/07/18: cholesterol 172, triglycerides 95, HDL 64. A1c 7.8%. creatinine 1.1. CBC and LFT normal.   Wt Readings from Last 3 Encounters:  02/11/19 178 lb 6.4 oz (80.9 kg)  11/12/18 184 lb 9.6 oz (83.7 kg)  05/27/18 179 lb (81.2 kg)      Other studies Reviewed: Additional studies/ records that were reviewed today include:  Myoview  12/29/14: Study Highlights   Nuclear stress EF: 60%.  The left ventricular ejection fraction is normal (55-65%).  There was no ST segment deviation noted during stress.  No T wave inversion was noted during stress.  The study is normal.  This is a low risk study.   Low risk stress nuclear study with normal perfusion and normal left ventricular regional and global systolic function.    Echo 01/06/15:Study Conclusions  - Left ventricle: The cavity size was normal. Wall thickness was   increased in a pattern of mild LVH. Systolic function was normal.   The estimated ejection fraction was in the range of 55% to 60%.   Wall motion was normal; there were no regional wall motion   abnormalities. - Mitral valve: Calcified annulus. There was mild regurgitation. - Atrial septum: No defect or patent foramen ovale was identified.    ASSESSMENT AND PLAN:  1.  Multiple symptoms including chest pain, dyspnea, fatigue and decreased exercise tolerance. Her chest pain is somewhat atypical but is worse with exertion. Multiple cardiac risk factors including IDDM, HLD, and HTN. Prior  Cardiac evaluation in 2015-2016 unremarkable but with these new symptoms we need to re-evaluate. Will arrange for a Lexiscan myoview and Echo.  2. HTN well controlled. 3. IDDM 4. S/p renal transplant on immunosuppresion.  5. AV fistula left arm   Current medicines are reviewed at length with the patient today.  The patient does not have concerns regarding medicines.  The following changes have been made:  no change  Labs/ tests ordered today include:   Orders Placed This Encounter  Procedures   Myocardial Perfusion Imaging   EKG 12-Lead   ECHOCARDIOGRAM COMPLETE     Disposition:   FU TBD  Signed, Ariya Bohannon [place], MD  02/11/2019 12:02 PM    [address]  [place], [place], [place], [address] Phone [phone], Fax [phone]

## 2019-02-10 NOTE — Progress Notes (Signed)
Cardiology Office Note   Date:  02/11/2019   ID:  Cynthia [name], DOB 01-29-1971, MRN [id number]  PCP:  Katherina Mires, MD  Cardiologist:   Anndrea Mihelich [place], MD   Chief Complaint  Patient presents with   Chest Pain   Shortness of Breath      History of Present Illness: Cynthia [name] is a 48 y.o. female who is seen at the request of Dr Doreene Nest for evaluation of decreased exercise tolerance. She has a history of ESRD previously on dialysis but subsequent renal transplant in 2014. Also history of IDDM, HTN, and anemia. She had prior cardiac evaluation in 2015 with cardiac cath showing coronary calcification but widely patent arteries. She had a Nuclear stress test and Echo in 2016 which were normal.   She is seen today with an interpreter. She notes that for the past 8 months she is very tired and when she goes up stairs she gets very weak. She complain of pain in the left parasternal area. This is fairly continuous but waxes and wanes and is worse with activity. She also gets SOB easily. She complain of dyspnea. No dizziness or syncope. Notes some palpitations. Her renal function has been good.     Past Medical History:  Diagnosis Date   Anemia    when on dialysis   Anxiety    Depression    ESRD on hemodialysis ([place])    Home HD 5x per week- not on dialysis now had tramsplant 4/17   GERD (gastroesophageal reflux disease)    History of blood transfusion    transfusion reaction   Hyperlipidemia    Hypertension    Insulin-dependent diabetes mellitus with renal complications (HCC)    Type I beginning now type II per pt-dr levy also II   Kidney transplant recipient    Neuromuscular disorder ([place])    NEUROPATHY    Past Surgical History:  Procedure Laterality Date   ABDOMINAL HYSTERECTOMY     BREAST LUMPECTOMY WITH RADIOACTIVE SEED LOCALIZATION Right 09/14/2015   Procedure: RIGHT BREAST LUMPECTOMY WITH RADIOACTIVE SEED LOCALIZATION;  Surgeon: Donnie Mesa, MD;   Location: [facility];  Service: General;  Laterality: Right;   BREAST SURGERY Bilateral    biopsy bilateral   CATARACT EXTRACTION Bilateral    bilateral   CHOLECYSTECTOMY     CYST REMOVAL NECK     DIALYSIS FISTULA CREATION Left    EYE SURGERY Bilateral    lazer   LEFT HEART CATHETERIZATION WITH CORONARY ANGIOGRAM N/A 03/30/2014   Procedure: LEFT HEART CATHETERIZATION WITH CORONARY ANGIOGRAM;  Surgeon: Sinclair Grooms, MD;  Location[facility] C[facility] LAB;  Service: Cardiovascular;  Laterality: N/A;   LIPOMA EXCISION N/A 02/06/2017   Procedure: EXCISION POSTERIOR NECK SEBACEOUS CYST;  Surgeon: Coralie Keens, MD;  Location: [facility];  Service: General;  Laterality: N/A;   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   Procedure: REPAIR OF ARTERIOVENOUS FISTULA ANEURYSM;  Surgeon: Serafina Mitchell, MD;  [facility];  Service: Vascular;  Laterality: Left;   REVISON OF ARTERIOVENOUS FISTULA Left 09/28/2013   Procedure: EXCISE ESCHAR LEFT ARM  ARTERIOVENOUS FISTULA WITH PLICATION OF LEFT ARM ARTERIOVENOUS FISTULA;  Surgeon: Elam Dutch, MD;  Location: [facility];  Service: Vascular;  Laterality: Left;   REVISON OF ARTERIOVENOUS FISTULA Left 08/26/2015   Procedure: RESECTION ANEURYSM OF LEFT ARM ARTERIOVENOUS FISTULA  ;  Surgeon: Serafina Mitchell, MD;  Location: [facility];  Service: Vascular;  Laterality: Left;  TUBAL LIGATION       Current Outpatient Medications  Medication Sig Dispense Refill   amLODipine (NORVASC) 5 MG tablet Take by mouth.     aspirin EC 81 MG EC tablet Take 1 tablet (81 mg total) by mouth daily. 90 tablet 3   Biotin 10 MG CAPS Take 10 mg by mouth daily.      carvedilol (COREG) 12.5 MG tablet Take by mouth.     cetirizine (ZYRTEC) 10 MG tablet Take 5 mg by mouth daily. Takes 0.5 tablet daily     Cholecalciferol (VITAMIN D3) 1000 units CAPS Take 1,000 Units by mouth daily.      cloNIDine (CATAPRES) 0.1 MG tablet Take 2 tablets (0.2 mg  total) by mouth 2 (two) times daily. (Patient taking differently: Take 0.1 mg by mouth daily. ) 10 tablet 0   docusate sodium (COLACE) 100 MG capsule Take 100 mg by mouth 2 (two) times daily.     fludrocortisone (FLORINEF) 0.1 MG tablet Take 0.1 mg by mouth daily.     gabapentin (NEURONTIN) 300 MG capsule Take 300 mg by mouth at bedtime.  12   glucose blood (ONETOUCH VERIO) test strip 1 each by Other route 2 (two) times daily. And lancets+alcohol pads 2/day 100 each 12   Insulin Glargine (LANTUS SOLOSTAR) 100 UNIT/ML Solostar Pen Inject 10 Units into the skin at bedtime. 5 pen 11   insulin lispro (HUMALOG KWIKPEN) 100 UNIT/ML KwikPen Inject 0.09 mLs (9 Units total) into the skin 3 (three) times daily with meals. 15 mL 11   magnesium oxide (MAG-OX) 400 MG tablet TAKE 1 TABLET(400 MG) BY MOUTH TWICE DAILY     MAGNESIUM-OXIDE 400 (241.3 Mg) MG tablet Take 400 mg by mouth daily.   11   mycophenolate (MYFORTIC) 180 MG EC tablet Take 360 mg by mouth 2 (two) times daily.     mycophenolate (MYFORTIC) 360 MG TBEC EC tablet Take 720 mg by mouth 2 (two) times daily.     pantoprazole (PROTONIX) 40 MG tablet Take 40 mg by mouth daily.  5   polyethylene glycol powder (GLYCOLAX/MIRALAX) powder MIX 17 GRAMS WITH LIQUID AND TAKE PO DAILY  0   predniSONE (DELTASONE) 5 MG tablet TAKE 1 TABLET DAILY  2   rosuvastatin (CRESTOR) 5 MG tablet Take 5 mg by mouth at bedtime.     sodium bicarbonate 650 MG tablet Take 1 tablet (650 mg total) by mouth 3 (three) times daily. 90 tablet 1   tacrolimus (PROGRAF) 1 MG capsule 2 mg at 9 AM, 1 mg at 9 PM---Z94.0     No current facility-administered medications for this visit.     Allergies:   Other and Tape    Social History:  The patient  reports that she has never smoked. She has never used smokeless tobacco. She reports that she does not drink alcohol or use drugs.   Family History:  The patient's family history includes Asthma in her sister; Cancer in her  maternal grandmother; Diabetes in her brother, father, maternal grandfather, mother, paternal grandmother, and sister; Heart disease in her father; Kidney disease in her father and mother; Lupus in her sister.    ROS:  Please see the history of present illness.   Otherwise, review of systems are positive for none.   All other systems are reviewed and negative.    PHYSICAL EXAM: VS:  BP (!) 120/52    Pulse 62    Temp (!) 96.9 F (36.1 C)  Ht 5\' 1"  (1.549 m)    Wt 178 lb 6.4 oz (80.9 kg)    SpO2 98%    BMI 33.71 kg/m  , BMI Body mass index is 33.71 kg/m. GEN: Well nourished, overweigth, in no acute distress  HEENT: normal  Neck: no JVD, carotid bruits, or masses Cardiac: RRR; no murmurs, rubs, or gallops,no edema  Respiratory:  clear to auscultation bilaterally, normal work of breathing GI: soft, nontender, nondistended, + BS MS: no deformity or atrophy, there is  Large functioning AV fistula in the left upper arm with continuous murmur and thrill. The murmur radiates throughout her chest Skin: warm and dry, no rash Neuro:  Strength and sensation are intact Psych: euthymic mood, full affect   EKG:  EKG is ordered today. The ekg ordered today demonstrates NSR with normal Ecg. I have personally reviewed and interpreted this study.    Recent Labs: No results found for requested labs within last 8760 hours.    Lipid Panel    Component Value Date/Time   CHOL 162 03/29/2014 0616   TRIG 284 (H) 03/29/2014 0616   HDL 31 (L) 03/29/2014 0616   CHOLHDL 5.2 03/29/2014 0616   VLDL 57 (H) 03/29/2014 0616   LDLCALC 74 03/29/2014 0616    Labs dated 11/07/18: cholesterol 172, triglycerides 95, HDL 64. A1c 7.8%. creatinine 1.1. CBC and LFT normal.   Wt Readings from Last 3 Encounters:  02/11/19 178 lb 6.4 oz (80.9 kg)  11/12/18 184 lb 9.6 oz (83.7 kg)  05/27/18 179 lb (81.2 kg)      Other studies Reviewed: Additional studies/ records that were reviewed today include:  Myoview  12/29/14: Study Highlights   Nuclear stress EF: 60%.  The left ventricular ejection fraction is normal (55-65%).  There was no ST segment deviation noted during stress.  No T wave inversion was noted during stress.  The study is normal.  This is a low risk study.   Low risk stress nuclear study with normal perfusion and normal left ventricular regional and global systolic function.    Echo 01/06/15:Study Conclusions  - Left ventricle: The cavity size was normal. Wall thickness was   increased in a pattern of mild LVH. Systolic function was normal.   The estimated ejection fraction was in the range of 55% to 60%.   Wall motion was normal; there were no regional wall motion   abnormalities. - Mitral valve: Calcified annulus. There was mild regurgitation. - Atrial septum: No defect or patent foramen ovale was identified.    ASSESSMENT AND PLAN:  1.  Multiple symptoms including chest pain, dyspnea, fatigue and decreased exercise tolerance. Her chest pain is somewhat atypical but is worse with exertion. Multiple cardiac risk factors including IDDM, HLD, and HTN. Prior  Cardiac evaluation in 2015-2016 unremarkable but with these new symptoms we need to re-evaluate. Will arrange for a Lexiscan myoview and Echo.  2. HTN well controlled. 3. IDDM 4. S/p renal transplant on immunosuppresion.  5. AV fistula left arm   Current medicines are reviewed at length with the patient today.  The patient does not have concerns regarding medicines.  The following changes have been made:  no change  Labs/ tests ordered today include:   Orders Placed This Encounter  Procedures   Myocardial Perfusion Imaging   EKG 12-Lead   ECHOCARDIOGRAM COMPLETE     Disposition:   FU TBD  Signed, Meshelle Holness Martinique, MD  02/11/2019 12:02 PM    [address], [place], [place], [address] Phone [phone], Fax [phone]

## 2019-02-11 ENCOUNTER — Ambulatory Visit (INDEPENDENT_AMBULATORY_CARE_PROVIDER_SITE_OTHER): Payer: Medicare Other | Admitting: Cardiology

## 2019-02-11 ENCOUNTER — Encounter: Payer: Self-pay | Admitting: Endocrinology

## 2019-02-11 ENCOUNTER — Encounter: Payer: Self-pay | Admitting: Cardiology

## 2019-02-11 ENCOUNTER — Ambulatory Visit (INDEPENDENT_AMBULATORY_CARE_PROVIDER_SITE_OTHER): Payer: Medicare Other | Admitting: Endocrinology

## 2019-02-11 ENCOUNTER — Other Ambulatory Visit: Payer: Self-pay

## 2019-02-11 VITALS — BP 120/52 | HR 62 | Temp 96.9°F | Ht 61.0 in | Wt 178.4 lb

## 2019-02-11 VITALS — BP 100/40 | HR 65 | Ht 61.0 in | Wt 179.4 lb

## 2019-02-11 DIAGNOSIS — R0609 Other forms of dyspnea: Secondary | ICD-10-CM

## 2019-02-11 DIAGNOSIS — R079 Chest pain, unspecified: Secondary | ICD-10-CM

## 2019-02-11 DIAGNOSIS — E1129 Type 2 diabetes mellitus with other diabetic kidney complication: Secondary | ICD-10-CM

## 2019-02-11 DIAGNOSIS — R5383 Other fatigue: Secondary | ICD-10-CM | POA: Diagnosis not present

## 2019-02-11 DIAGNOSIS — R072 Precordial pain: Secondary | ICD-10-CM | POA: Diagnosis not present

## 2019-02-11 DIAGNOSIS — Z794 Long term (current) use of insulin: Secondary | ICD-10-CM

## 2019-02-11 DIAGNOSIS — E119 Type 2 diabetes mellitus without complications: Secondary | ICD-10-CM

## 2019-02-11 DIAGNOSIS — IMO0001 Reserved for inherently not codable concepts without codable children: Secondary | ICD-10-CM

## 2019-02-11 DIAGNOSIS — I10 Essential (primary) hypertension: Secondary | ICD-10-CM

## 2019-02-11 DIAGNOSIS — R0600 Dyspnea, unspecified: Secondary | ICD-10-CM

## 2019-02-11 DIAGNOSIS — Z940 Kidney transplant status: Secondary | ICD-10-CM

## 2019-02-11 LAB — POCT GLYCOSYLATED HEMOGLOBIN (HGB A1C): Hemoglobin A1C: 8.5 % — AB (ref 4.0–5.6)

## 2019-02-11 MED ORDER — LANTUS SOLOSTAR 100 UNIT/ML ~~LOC~~ SOPN: 25.0000 [IU] | PEN_INJECTOR | SUBCUTANEOUS | 11 refills | Status: DC

## 2019-02-11 NOTE — Patient Instructions (Signed)
Medication Instructions:  Continue same medications   Lab work: None ordered   Testing/Procedures: Schedule Lexiscan Myoview   Schedule Echocardiogram   Follow-Up: At [facility], you and your health needs are our priority.  As part of our continuing mission to provide you with exceptional heart care, we have created designated Provider Care Teams.  These Care Teams include your primary Cardiologist (physician) and Advanced Practice Providers (APPs -  Physician Assistants and Nurse Practitioners) who all work together to provide you with the care you need, when you need it. . Follow Up appointment will depend on results

## 2019-02-11 NOTE — Patient Instructions (Addendum)
check your blood sugar twice a day.  vary the time of day when you check, between before the 3 meals, and at bedtime.  also check if you have symptoms of your blood sugar being too high or too low.  please keep a record of the readings and bring it to your next appointment here (or you can bring the meter itself).  You can write it on any piece of paper.  please call us sooner if your blood sugar goes below 70, or if you have a lot of readings over 200. Please stop taking the humalog, and: Take Lantus 25 units each morning On this type of insulin schedule, you should eat meals on a regular schedule.  If a meal is missed or significantly delayed, your blood sugar could go low. Please come back for a follow-up appointment in 2 months.    controle su nivel de azcar en [place]. Vare la hora del da en que lo comprueba, entre antes de las 3 comidas y antes de [place]. Compruebe tambin si tiene sntomas de que su nivel de Location manager en sangre es demasiado alto o demasiado bajo. por favor mantenga un registro de las lecturas y Air cabin crew a su prxima cita aqu (o puede traer Nature conservation officer). Puedes escribirlo en cualquier hoja de papel. Llmenos antes si su nivel de azcar en sangre es inferior a 30 o si tiene muchas lecturas superiores a 200. Deje de tomar el humalog y: Jomarie Longs Lantus 25 unidades cada maana En este tipo de horario de [place], debe comer en un horario regular. Si se salta una comida o se retrasa significativamente, su nivel de azcar en sangre podra bajar. Regrese para una cita de seguimiento en 2 meses.

## 2019-02-11 NOTE — Progress Notes (Signed)
Subjective:    Patient ID: Cynthia [name], female    DOB: 05-02-1971, 48 y.o.   MRN: [id number]  HPI Pt returns for f/u of diabetes mellitus:  DM type: Insulin-requiring type 2. Dx'ed: 4696 Complications: DR, CAD, and ESRD (transplant 2017) Therapy: insulin since soon after dx GDM: 1990 DKA: never Severe hypoglycemia: last episode was mid-2019 Pancreatitis: never Pancreatic imaging: normal on 2017 CT Other: she declined pump rx, due to high copay; she is on multiple daily injections now, but due to h/o noncompliance, she may need to change to qd insulin.  Interval history: pt states she feels well in general.  Pt says she seldom misses the insulin.  no cbg record, but she says cbg varies from 49-220.  It is in general higher as the day goes on.   Pt says she eats 1-2 meals per day (and injects 1-2 times per day) Past Medical History:  Diagnosis Date  . Anemia    when on dialysis  . Anxiety   . Depression   . ESRD on hemodialysis ([place])    Home HD 5x per week- not on dialysis now had tramsplant 4/17  . GERD (gastroesophageal reflux disease)   . History of blood transfusion    transfusion reaction  . Hyperlipidemia   . Hypertension   . Insulin-dependent diabetes mellitus with renal complications ([place])    Type I beginning now type II per pt-dr levy also II  . Kidney transplant recipient   . Neuromuscular disorder ([place])    NEUROPATHY    Past Surgical History:  Procedure Laterality Date  . ABDOMINAL HYSTERECTOMY    . BREAST LUMPECTOMY WITH RADIOACTIVE SEED LOCALIZATION Right 09/14/2015   Procedure: RIGHT BREAST LUMPECTOMY WITH RADIOACTIVE SEED LOCALIZATION;  Surgeon: Donnie Mesa, MD;  Location: [facility];  Service: General;  Laterality: Right;  . BREAST SURGERY Bilateral    biopsy bilateral  . CATARACT EXTRACTION Bilateral    bilateral  . CHOLECYSTECTOMY    . CYST REMOVAL NECK    . DIALYSIS FISTULA CREATION Left   . EYE SURGERY Bilateral    lazer  .  LEFT HEART CATHETERIZATION WITH CORONARY ANGIOGRAM N/A 03/30/2014   Procedure: LEFT HEART CATHETERIZATION WITH CORONARY ANGIOGRAM;  Surgeon: Sinclair Grooms, MD;  Location: [facility] CATH LAB;  Service: Cardiovascular;  Laterality: N/A;  . LIPOMA EXCISION N/A 02/06/2017   Procedure: EXCISION POSTERIOR NECK SEBACEOUS CYST;  Surgeon: Coralie Keens, MD;  Location: [facility];  Service: General;  Laterality: N/A;  . 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 REVISON OF ARTERIOVENOUS FISTULA Left 09/28/2013   Procedure: EXCISE ESCHAR LEFT ARM  ARTERIOVENOUS FISTULA WITH PLICATION OF LEFT ARM ARTERIOVENOUS FISTULA;  Surgeon: Elam Dutch, MD;  Location: [facility];  Service: Vascular;  Laterality: Left;  . REVISON OF ARTERIOVENOUS FISTULA Left 08/26/2015   Procedure: RESECTION ANEURYSM OF LEFT ARM ARTERIOVENOUS FISTULA  ;  Surgeon: Serafina Mitchell, MD;  Location: [facility];  Service: Vascular;  Laterality: Left;  . TUBAL LIGATION      Social History   Socioeconomic History  . Marital status: Legally Separated    Spouse name: Not on file  . Number of children: 3  . Years of education: 9  . Highest education level: Not on file  Occupational History  . Occupation: disabled  Social Needs  .  Financial resource strain: Not on file  . Food insecurity    Worry: Not on file    Inability: Not on file  . Transportation needs    Medical: Not on file    Non-medical: Not on file  Tobacco Use  . Smoking status: Never Smoker  . Smokeless tobacco: Never Used  Substance and Sexual Activity  . Alcohol use: No  . Drug use: No  . Sexual activity: Yes  Lifestyle  . Physical activity    Days per week: Not on file    Minutes per session: Not on file  . Stress: Not on file  Relationships  . Social Herbalist on phone: Not on file    Gets together:  Not on file    Attends religious service: Not on file    Active member of club or organization: Not on file    Attends meetings of clubs or organizations: Not on file    Relationship status: Not on file  . Intimate partner violence    Fear of current or ex partner: Not on file    Emotionally abused: Not on file    Physically abused: Not on file    Forced sexual activity: Not on file  Other Topics Concern  . Not on file  Social History Narrative   Lives in home with daughters, grand daughter, son-in-law   Caffeine use - 1 cup coffee sometimes     Current Outpatient Medications on File Prior to Visit  Medication Sig Dispense Refill  . amLODipine (NORVASC) 5 MG tablet Take by mouth.    Marland Kitchen aspirin EC 81 MG EC tablet Take 1 tablet (81 mg total) by mouth daily. 90 tablet 3  . Biotin 10 MG CAPS Take 10 mg by mouth daily.     . carvedilol (COREG) 12.5 MG tablet Take by mouth.    . cetirizine (ZYRTEC) 10 MG tablet Take 5 mg by mouth daily. Takes 0.5 tablet daily    . Cholecalciferol (VITAMIN D3) 1000 units CAPS Take 1,000 Units by mouth daily.     . cloNIDine (CATAPRES) 0.1 MG tablet Take 2 tablets (0.2 mg total) by mouth 2 (two) times daily. (Patient taking differently: Take 0.1 mg by mouth daily. ) 10 tablet 0  . docusate sodium (COLACE) 100 MG capsule Take 100 mg by mouth 2 (two) times daily.    . fludrocortisone (FLORINEF) 0.1 MG tablet Take 0.1 mg by mouth daily.    Marland Kitchen gabapentin (NEURONTIN) 300 MG capsule Take 300 mg by mouth at bedtime.  12  . glucose blood (ONETOUCH VERIO) test strip 1 each by Other route 2 (two) times daily. And lancets+alcohol pads 2/day 100 each 12  . magnesium oxide (MAG-OX) 400 MG tablet TAKE 1 TABLET(400 MG) BY MOUTH TWICE DAILY    . mycophenolate (MYFORTIC) 180 MG EC tablet Take 360 mg by mouth 2 (two) times daily.    . pantoprazole (PROTONIX) 40 MG tablet Take 40 mg by mouth daily.  5  . polyethylene glycol powder (GLYCOLAX/MIRALAX) powder MIX 17 GRAMS WITH  LIQUID AND TAKE PO DAILY  0  . predniSONE (DELTASONE) 5 MG tablet TAKE 1 TABLET DAILY  2  . rosuvastatin (CRESTOR) 5 MG tablet Take 5 mg by mouth at bedtime.    . sodium bicarbonate 650 MG tablet Take 1 tablet (650 mg total) by mouth 3 (three) times daily. 90 tablet 1  . tacrolimus (PROGRAF) 1 MG capsule 2 mg at 9 AM, 1 mg  at 9 PM---Z94.0     No current facility-administered medications on file prior to visit.     Allergies  Allergen Reactions  . Other Rash    Burns skin.Please use paper tape only Burns skin.Please use paper tape only  . Tape Other (See Comments)    Burns skin.  Please use paper tape only    Family History  Problem Relation Age of Onset  . Cancer Maternal Grandmother   . Diabetes Maternal Grandfather   . Diabetes Paternal Grandmother   . Diabetes Mother   . Kidney disease Mother   . Diabetes Father   . Heart disease Father   . Kidney disease Father   . Diabetes Sister   . Asthma Sister   . Lupus Sister   . Diabetes Brother     BP (!) 100/40 (BP Location: Right Arm, Patient Position: Sitting, Cuff Size: Large)   Pulse 65   Ht 5\' 1"  (1.549 m)   Wt 179 lb 6.4 oz (81.4 kg)   SpO2 97%   BMI 33.90 kg/m    Review of Systems Denies LOC    Objective:   Physical Exam VITAL SIGNS:  See vs page GENERAL: no distress Pulses: dorsalis pedis intact bilat.   MSK: no deformity of the feet CV: no leg edema Skin:  no ulcer on the feet.  normal color and temp on the feet. Neuro: sensation is intact to touch on the feet  Lab Results  Component Value Date   HGBA1C 8.5 (A) 02/11/2019   Lab Results  Component Value Date   CREATININE 1.27 (H) 02/04/2017   BUN 23 (H) 02/04/2017   NA 138 02/04/2017   K 4.2 02/04/2017   CL 106 02/04/2017   CO2 27 02/04/2017       Assessment & Plan:  Insulin-requiring type 2 DM, with CAD: worse.  We discussed.  We'll change to a simpler regimen.   Renal insuff: she may need a faster-acting qd insulin, but we'll try the  same for now  Patient Instructions  check your blood sugar twice a day.  vary the time of day when you check, between before the 3 meals, and at bedtime.  also check if you have symptoms of your blood sugar being too high or too low.  please keep a record of the readings and bring it to your next appointment here (or you can bring the meter itself).  You can write it on any piece of paper.  please call us sooner if your blood sugar goes below 70, or if you have a lot of readings over 200. Please stop taking the humalog, and: Take Lantus 25 units each morning On this type of insulin schedule, you should eat meals on a regular schedule.  If a meal is missed or significantly delayed, your blood sugar could go low. Please come back for a follow-up appointment in 2 months.    controle su nivel de azcar en [place]. Vare la hora del da en que lo comprueba, entre antes de las 3 comidas y antes de [place]. Compruebe tambin si tiene sntomas de que su nivel de Location manager en sangre es demasiado alto o demasiado bajo. por favor mantenga un registro de las lecturas y Air cabin crew a su prxima cita aqu (o puede traer Nature conservation officer). Puedes escribirlo en cualquier hoja de papel. Llmenos antes si su nivel de azcar en sangre es inferior a 35 o si tiene muchas lecturas superiores a 200. Deje de  tomar Paramedic y: Jomarie Longs Lantus 25 unidades cada maana En este tipo de horario de [place], debe comer en un horario regular. Si se salta una comida o se retrasa significativamente, su nivel de azcar en sangre podra bajar. Regrese para una cita de seguimiento en 2 meses.

## 2019-02-17 ENCOUNTER — Telehealth (HOSPITAL_COMMUNITY): Payer: Self-pay

## 2019-02-17 NOTE — Telephone Encounter (Signed)
Encounter complete. 

## 2019-02-18 ENCOUNTER — Other Ambulatory Visit: Payer: Self-pay | Admitting: Family Medicine

## 2019-02-18 DIAGNOSIS — Z1231 Encounter for screening mammogram for malignant neoplasm of breast: Secondary | ICD-10-CM

## 2019-02-19 ENCOUNTER — Ambulatory Visit (HOSPITAL_COMMUNITY): Payer: Medicare Other

## 2019-02-19 ENCOUNTER — Ambulatory Visit (HOSPITAL_COMMUNITY)
Admission: RE | Admit: 2019-02-19 | Discharge: 2019-02-19 | Disposition: A | Discharge status: Home / Self Care | Payer: Medicare Other | Source: Ambulatory Visit | Attending: Cardiovascular Disease | Admitting: Cardiovascular Disease

## 2019-02-19 ENCOUNTER — Other Ambulatory Visit: Payer: Self-pay

## 2019-02-19 DIAGNOSIS — R072 Precordial pain: Secondary | ICD-10-CM | POA: Insufficient documentation

## 2019-02-19 LAB — MYOCARDIAL PERFUSION IMAGING
LV dias vol: 125 mL (ref 46–106)
LV sys vol: 52 mL
Peak HR: 75 {beats}/min
Rest HR: 62 {beats}/min
SDS: 0
SRS: 1
SSS: 1
TID: 1.03

## 2019-02-19 MED ORDER — TECHNETIUM TC 99M TETROFOSMIN IV KIT
10.3000 | PACK | Freq: Once | INTRAVENOUS | Status: AC | PRN
  Administered 2019-02-19: 10.3 via INTRAVENOUS
  Filled 2019-02-19: qty 11

## 2019-02-19 MED ORDER — REGADENOSON 0.4 MG/5ML IV SOLN
0.4000 mg | Freq: Once | INTRAVENOUS | Status: AC
  Administered 2019-02-19: 0.4 mg via INTRAVENOUS

## 2019-02-19 MED ORDER — TECHNETIUM TC 99M TETROFOSMIN IV KIT
31.4000 | PACK | Freq: Once | INTRAVENOUS | Status: AC | PRN
  Administered 2019-02-19: 31.4 via INTRAVENOUS
  Filled 2019-02-19: qty 32

## 2019-02-23 ENCOUNTER — Telehealth: Payer: Self-pay

## 2019-02-23 DIAGNOSIS — R072 Precordial pain: Secondary | ICD-10-CM

## 2019-02-23 DIAGNOSIS — I10 Essential (primary) hypertension: Secondary | ICD-10-CM

## 2019-02-23 DIAGNOSIS — Z01812 Encounter for preprocedural laboratory examination: Secondary | ICD-10-CM

## 2019-02-23 NOTE — Telephone Encounter (Signed)
 @  [facility] [address] [place] [address] Dept: [phone] Loc: [place]  02/23/2019  You are scheduled for a Cardiac Cath on Wednesday 03/04/19  with Dr.Jordan.  1. Please arrive at the [facility] (Main Entrance A) at [place]: [address] [place], [place] [address] at 6:30 am (This time is two hours before your procedure to ensure your preparation). Free valet parking service is available.   Special note: Every effort is made to have your procedure done on time. Please understand that emergencies sometimes delay scheduled procedures.  2. Diet: Do not eat solid foods after midnight.  The patient may have clear liquids until 5am upon the day of the procedure.  3. Labs: You will need to have blood drawn on Friday 02/27/19 You do not need to be fasting.After lab work you will need to have Covid Test at [facility] site drive thru procedural line.After Covid test you will need to quarantine until after cath.  4. Medication instructions in preparation for your procedure:    Hold morning Insulin dose    On the morning of your procedure, take your Aspirin 81 mg and any morning medicines NOT listed above.  You may use sips of water.  5. Plan for one night stay--bring personal belongings. 6. Bring a current list of your medications and current insurance cards. 7. You MUST have a responsible person to drive you home. 8. Someone MUST be with you the first 24 hours after you arrive home or your discharge will be delayed. 9. Please wear clothes that are easy to get on and off and wear slip-on shoes.  Thank you for allowing [place] to care for you!   -- [place] [facility]

## 2019-02-24 ENCOUNTER — Other Ambulatory Visit: Payer: Self-pay | Admitting: Cardiology

## 2019-02-24 DIAGNOSIS — I209 Angina pectoris, unspecified: Secondary | ICD-10-CM

## 2019-02-24 MED ORDER — SODIUM CHLORIDE 0.9% FLUSH: 3.0000 mL | Freq: Two times a day (BID) | INTRAVENOUS | Status: DC

## 2019-02-27 ENCOUNTER — Other Ambulatory Visit (HOSPITAL_COMMUNITY)
Admission: RE | Admit: 2019-02-27 | Discharge: 2019-02-27 | Disposition: A | Discharge status: Home / Self Care | Payer: Medicare Other | Source: Ambulatory Visit | Attending: Cardiology | Admitting: Cardiology

## 2019-02-27 DIAGNOSIS — Z01812 Encounter for preprocedural laboratory examination: Secondary | ICD-10-CM | POA: Diagnosis present

## 2019-02-27 DIAGNOSIS — Z20828 Contact with and (suspected) exposure to other viral communicable diseases: Secondary | ICD-10-CM | POA: Diagnosis not present

## 2019-02-27 LAB — PT AND PTT
INR: 0.9 (ref 0.8–1.2)
Prothrombin Time: 10.2 s (ref 9.1–12.0)
aPTT: 25 s (ref 24–33)

## 2019-02-27 LAB — CBC WITH DIFFERENTIAL/PLATELET
Basophils Absolute: 0 10*3/uL (ref 0.0–0.2)
Basos: 1 %
EOS (ABSOLUTE): 0.1 10*3/uL (ref 0.0–0.4)
Eos: 2 %
Hematocrit: 39.5 % (ref 34.0–46.6)
Hemoglobin: 13.5 g/dL (ref 11.1–15.9)
Immature Grans (Abs): 0 10*3/uL (ref 0.0–0.1)
Immature Granulocytes: 0 %
Lymphocytes Absolute: 1.7 10*3/uL (ref 0.7–3.1)
Lymphs: 24 %
MCH: 32.1 pg (ref 26.6–33.0)
MCHC: 34.2 g/dL (ref 31.5–35.7)
MCV: 94 fL (ref 79–97)
Monocytes Absolute: 0.5 10*3/uL (ref 0.1–0.9)
Monocytes: 7 %
Neutrophils Absolute: 4.6 10*3/uL (ref 1.4–7.0)
Neutrophils: 66 %
Platelets: 186 10*3/uL (ref 150–450)
RBC: 4.2 x10E6/uL (ref 3.77–5.28)
RDW: 12.2 % (ref 11.7–15.4)
WBC: 7 10*3/uL (ref 3.4–10.8)

## 2019-02-27 LAB — BASIC METABOLIC PANEL
BUN/Creatinine Ratio: 17 (ref 9–23)
BUN: 19 mg/dL (ref 6–24)
CO2: 23 mmol/L (ref 20–29)
Calcium: 9.6 mg/dL (ref 8.7–10.2)
Chloride: 99 mmol/L (ref 96–106)
Creatinine, Ser: 1.11 mg/dL — ABNORMAL HIGH (ref 0.57–1.00)
GFR calc Af Amer: 67 mL/min/{1.73_m2} (ref >59)
GFR calc non Af Amer: 58 mL/min/{1.73_m2} — ABNORMAL LOW (ref >59)
Glucose: 281 mg/dL — ABNORMAL HIGH (ref 65–99)
Potassium: 4.7 mmol/L (ref 3.5–5.2)
Sodium: 136 mmol/L (ref 134–144)

## 2019-02-28 LAB — NOVEL CORONAVIRUS, NAA (HOSP ORDER, SEND-OUT TO REF LAB; TAT 18-24 HRS): SARS-CoV-2, NAA: NOT DETECTED

## 2019-03-03 ENCOUNTER — Telehealth: Payer: Self-pay

## 2019-03-03 DIAGNOSIS — E1121 Type 2 diabetes mellitus with diabetic nephropathy: Secondary | ICD-10-CM | POA: Insufficient documentation

## 2019-03-03 NOTE — Telephone Encounter (Signed)
Reviewed the following pre-procedure instructions with the patient. She verbalized understanding and had no questions at this time.  Pt contacted pre-catheterization scheduled at [facility] for: 8:30 am Verified arrival time and place: [place] [facility]) at: 6:30 am   No solid food after midnight prior to cath, clear liquids until 5 AM day of procedure. Contrast allergy: No Verified no diabetes medications. Patient is on diabetic medication.  Patient is instructed to hold her Lantus the morning of the procedure.  AM meds can be  taken pre-cath with sip of water including: ASA 81 mg   Confirmed patient has responsible person to drive home post procedure and observe 24 hours after arriving home: Patient has a responsible party to drive her home after the procedure and observe her for 24 hours.  Currently, due to Covid-19 pandemic, only one support person will be allowed with patient. Must be the same support person for that patient's entire stay, will be screened and required to wear a mask. They will be asked to wait in the waiting room for the duration of the patient's stay.  Patients are required to wear a mask when they enter the hospital.      COVID-19 Pre-Screening Questions:  . In the past 7 to 10 days have you had a cough,  shortness of breath, headache, congestion, fever (100 or greater) body aches, chills, sore throat, or sudden loss of taste or sense of smell? No . Have you been around anyone with known Covid 19. No . Have you been around anyone who is awaiting Covid 19 test results in the past 7 to 10 days? No . Have you been around anyone who has been exposed to Covid 19, or has mentioned symptoms of Covid 19 within the past 7 to 10 days? No  If you have any concerns/questions about symptoms patients report during screening (either on the phone or at threshold). Contact the provider seeing the patient or DOD for further guidance.  If neither  are available contact a member of the leadership team.

## 2019-03-04 ENCOUNTER — Encounter (HOSPITAL_COMMUNITY): Payer: Self-pay | Admitting: Cardiology

## 2019-03-04 ENCOUNTER — Other Ambulatory Visit: Payer: Self-pay

## 2019-03-04 ENCOUNTER — Ambulatory Visit (HOSPITAL_COMMUNITY)
Admission: RE | Admit: 2019-03-04 | Discharge: 2019-03-04 | Disposition: A | Discharge status: Home / Self Care | Payer: Medicare Other | Attending: Cardiology | Admitting: Cardiology

## 2019-03-04 ENCOUNTER — Encounter (HOSPITAL_COMMUNITY)
Admission: RE | Disposition: A | Discharge status: Home / Self Care | Payer: Medicare Other | Source: Home / Self Care | Attending: Cardiology

## 2019-03-04 DIAGNOSIS — Z7982 Long term (current) use of aspirin: Secondary | ICD-10-CM | POA: Insufficient documentation

## 2019-03-04 DIAGNOSIS — F419 Anxiety disorder, unspecified: Secondary | ICD-10-CM | POA: Insufficient documentation

## 2019-03-04 DIAGNOSIS — Z992 Dependence on renal dialysis: Secondary | ICD-10-CM | POA: Diagnosis not present

## 2019-03-04 DIAGNOSIS — K219 Gastro-esophageal reflux disease without esophagitis: Secondary | ICD-10-CM | POA: Insufficient documentation

## 2019-03-04 DIAGNOSIS — R079 Chest pain, unspecified: Secondary | ICD-10-CM | POA: Insufficient documentation

## 2019-03-04 DIAGNOSIS — R072 Precordial pain: Secondary | ICD-10-CM

## 2019-03-04 DIAGNOSIS — R9439 Abnormal result of other cardiovascular function study: Secondary | ICD-10-CM | POA: Diagnosis present

## 2019-03-04 DIAGNOSIS — I120 Hypertensive chronic kidney disease with stage 5 chronic kidney disease or end stage renal disease: Secondary | ICD-10-CM | POA: Insufficient documentation

## 2019-03-04 DIAGNOSIS — Z888 Allergy status to other drugs, medicaments and biological substances status: Secondary | ICD-10-CM | POA: Insufficient documentation

## 2019-03-04 DIAGNOSIS — N186 End stage renal disease: Secondary | ICD-10-CM | POA: Diagnosis not present

## 2019-03-04 DIAGNOSIS — F329 Major depressive disorder, single episode, unspecified: Secondary | ICD-10-CM | POA: Insufficient documentation

## 2019-03-04 DIAGNOSIS — E1122 Type 2 diabetes mellitus with diabetic chronic kidney disease: Secondary | ICD-10-CM | POA: Diagnosis not present

## 2019-03-04 DIAGNOSIS — Z794 Long term (current) use of insulin: Secondary | ICD-10-CM | POA: Insufficient documentation

## 2019-03-04 DIAGNOSIS — E119 Type 2 diabetes mellitus without complications: Secondary | ICD-10-CM

## 2019-03-04 DIAGNOSIS — Z8249 Family history of ischemic heart disease and other diseases of the circulatory system: Secondary | ICD-10-CM | POA: Insufficient documentation

## 2019-03-04 DIAGNOSIS — E109 Type 1 diabetes mellitus without complications: Secondary | ICD-10-CM

## 2019-03-04 DIAGNOSIS — I10 Essential (primary) hypertension: Secondary | ICD-10-CM | POA: Diagnosis present

## 2019-03-04 DIAGNOSIS — E1140 Type 2 diabetes mellitus with diabetic neuropathy, unspecified: Secondary | ICD-10-CM | POA: Insufficient documentation

## 2019-03-04 DIAGNOSIS — E785 Hyperlipidemia, unspecified: Secondary | ICD-10-CM | POA: Diagnosis not present

## 2019-03-04 DIAGNOSIS — Z940 Kidney transplant status: Secondary | ICD-10-CM | POA: Insufficient documentation

## 2019-03-04 DIAGNOSIS — I2510 Atherosclerotic heart disease of native coronary artery without angina pectoris: Secondary | ICD-10-CM | POA: Diagnosis not present

## 2019-03-04 DIAGNOSIS — Z79899 Other long term (current) drug therapy: Secondary | ICD-10-CM | POA: Insufficient documentation

## 2019-03-04 DIAGNOSIS — I209 Angina pectoris, unspecified: Secondary | ICD-10-CM

## 2019-03-04 HISTORY — PX: LEFT HEART CATH AND CORONARY ANGIOGRAPHY: CATH118249

## 2019-03-04 LAB — GLUCOSE, CAPILLARY: Glucose-Capillary: 277 mg/dL — ABNORMAL HIGH (ref 70–99)

## 2019-03-04 SURGERY — LEFT HEART CATH AND CORONARY ANGIOGRAPHY
Anesthesia: LOCAL

## 2019-03-04 MED ORDER — IOHEXOL 350 MG/ML SOLN
INTRAVENOUS | Status: DC | PRN
  Administered 2019-03-04: 08:00:00 60 mL

## 2019-03-04 MED ORDER — SODIUM CHLORIDE 0.9% FLUSH: 3.0000 mL | INTRAVENOUS | Status: DC | PRN

## 2019-03-04 MED ORDER — VERAPAMIL HCL 2.5 MG/ML IV SOLN
INTRAVENOUS | Status: DC | PRN
  Administered 2019-03-04: 08:00:00 10 mL via INTRA_ARTERIAL

## 2019-03-04 MED ORDER — ASPIRIN 81 MG PO CHEW: 81.0000 mg | CHEWABLE_TABLET | ORAL | Status: DC

## 2019-03-04 MED ORDER — SODIUM CHLORIDE 0.9 % IV SOLN: 250.0000 mL | INTRAVENOUS | Status: DC | PRN

## 2019-03-04 MED ORDER — SODIUM CHLORIDE 0.9% FLUSH: 3.0000 mL | Freq: Two times a day (BID) | INTRAVENOUS | Status: DC

## 2019-03-04 MED ORDER — MIDAZOLAM HCL 2 MG/2ML IJ SOLN
INTRAMUSCULAR | Status: DC | PRN
  Administered 2019-03-04: 1 mg via INTRAVENOUS

## 2019-03-04 MED ORDER — HEPARIN SODIUM (PORCINE) 1000 UNIT/ML IJ SOLN
INTRAMUSCULAR | Status: DC | PRN
  Administered 2019-03-04: 4000 [IU] via INTRAVENOUS

## 2019-03-04 MED ORDER — ONDANSETRON HCL 4 MG/2ML IJ SOLN: 4.0000 mg | Freq: Four times a day (QID) | INTRAMUSCULAR | Status: DC | PRN

## 2019-03-04 MED ORDER — SODIUM CHLORIDE 0.9 % WEIGHT BASED INFUSION: 1.0000 mL/kg/h | INTRAVENOUS | Status: AC

## 2019-03-04 MED ORDER — FENTANYL CITRATE (PF) 100 MCG/2ML IJ SOLN
INTRAMUSCULAR | Status: DC | PRN
  Administered 2019-03-04: 25 ug via INTRAVENOUS

## 2019-03-04 MED ORDER — LIDOCAINE HCL (PF) 1 % IJ SOLN
INTRAMUSCULAR | Status: AC
  Filled 2019-03-04: qty 30

## 2019-03-04 MED ORDER — LIDOCAINE HCL (PF) 1 % IJ SOLN
INTRAMUSCULAR | Status: DC | PRN
  Administered 2019-03-04: 3 mL

## 2019-03-04 MED ORDER — ACETAMINOPHEN 325 MG PO TABS: 650.0000 mg | ORAL_TABLET | ORAL | Status: DC | PRN

## 2019-03-04 MED ORDER — SODIUM CHLORIDE 0.9 % WEIGHT BASED INFUSION: 1.0000 mL/kg/h | INTRAVENOUS | Status: DC

## 2019-03-04 MED ORDER — HEPARIN (PORCINE) IN NACL 1000-0.9 UT/500ML-% IV SOLN
INTRAVENOUS | Status: AC
  Filled 2019-03-04: qty 1000

## 2019-03-04 MED ORDER — HEPARIN (PORCINE) IN NACL 1000-0.9 UT/500ML-% IV SOLN
INTRAVENOUS | Status: DC | PRN
  Administered 2019-03-04 (×2): 500 mL

## 2019-03-04 MED ORDER — MIDAZOLAM HCL 2 MG/2ML IJ SOLN
INTRAMUSCULAR | Status: AC
  Filled 2019-03-04: qty 2

## 2019-03-04 MED ORDER — VERAPAMIL HCL 2.5 MG/ML IV SOLN
INTRAVENOUS | Status: AC
  Filled 2019-03-04: qty 2

## 2019-03-04 MED ORDER — FENTANYL CITRATE (PF) 100 MCG/2ML IJ SOLN
INTRAMUSCULAR | Status: AC
  Filled 2019-03-04: qty 2

## 2019-03-04 MED ORDER — SODIUM CHLORIDE 0.9 % WEIGHT BASED INFUSION
3.0000 mL/kg/h | INTRAVENOUS | Status: AC
  Administered 2019-03-04: 07:00:00 3 mL/kg/h via INTRAVENOUS

## 2019-03-04 MED ORDER — HYDRALAZINE HCL 20 MG/ML IJ SOLN: 10.0000 mg | INTRAMUSCULAR | Status: DC | PRN

## 2019-03-04 SURGICAL SUPPLY — 12 items
CATH 5FR JL3.5 JR4 ANG PIG MP (CATHETERS) ×2 IMPLANT
DEVICE RAD COMP TR BAND LRG (VASCULAR PRODUCTS) ×2 IMPLANT
GLIDESHEATH SLEND SS 6F .021 (SHEATH) ×2 IMPLANT
GUIDEWIRE INQWIRE 1.5J.035X260 (WIRE) ×1 IMPLANT
INQWIRE 1.5J .035X260CM (WIRE) ×2
KIT HEART LEFT (KITS) ×2 IMPLANT
PACK CARDIAC CATHETERIZATION (CUSTOM PROCEDURE TRAY) ×2 IMPLANT
SHEATH PROBE COVER 6X72 (BAG) ×2 IMPLANT
SYR MEDRAD MARK 7 150ML (SYRINGE) ×2 IMPLANT
TRANSDUCER W/STOPCOCK (MISCELLANEOUS) ×2 IMPLANT
TUBING CIL FLEX 10 FLL-RA (TUBING) ×2 IMPLANT
VALVE MANIFOLD 3 PORT W/RA/ON (MISCELLANEOUS) ×2 IMPLANT

## 2019-03-04 NOTE — Discharge Instructions (Signed)
Cuidado del sitio del radio Radial Site Care  Esta hoja le proporciona informacin sobre cmo cuidarse despus del procedimiento. El mdico tambin podr darle instrucciones ms especficas. Comunquese con su mdico si tiene problemas o preguntas. Qu puedo esperar despus del procedimiento? Despus del procedimiento, es comn [employer] siguientes sntomas:  Moretones y Social research officer, government a Programmer, applications de insercin del Holiday representative. Siga estas indicaciones en su casa: Medicamentos  [employer] de venta libre y los recetados solamente como se lo haya indicado el mdico. Cuidados de la zona de la insercin  Siga las indicaciones del mdico acerca de los cuidados del Environmental consultant de la insercin. Haga lo siguiente: ? Lvese las manos con agua y jabn antes de Quarry manager las vendas (vendajes). Use desinfectante para manos si no dispone de [place] y [place]. ? Cambie los vendajes como se lo haya indicado el mdico. ? No retire los puntos (suturas), la goma para cerrar la piel o las tiras [place]. Es posible que estos cierres cutneos Animal nutritionist en la piel durante 2semanas o ms. Si los bordes de las tiras adhesivas empiezan a despegarse y Therapist, sports, puede recortar los que estn sueltos. No retire las tiras [facility] por completo a menos que el mdico se lo indique.  Psychiatric nurse de insercin todos los das para descartar signos de infeccin. Est atento a los siguientes signos: ? Dolor, hinchazn o enrojecimiento. ? Lquido o sangre. ? Pus o mal olor. ? Calor.  No tome baos de inmersin, no nade ni use el jacuzzi hasta que el mdico lo autorice.  Puede ducharse entre 24y 48horas despus del procedimiento o como se lo haya indicado el mdico. ? Retire el vendaje y lave suavemente el lugar de la insercin con agua y jabn comn. ? Seque bien el rea con una toalla limpia dando golpecitos. ? No se frote el lugar. Al hacerlo, puede ocasionar sangrado.  No se aplique talcos ni  lociones en el lugar. Elnora 24horas posteriores al procedimiento o segn las indicaciones del mdico: ? No flexione ni doble el brazo afectado. ? No empuje ni jale objetos pesados con el brazo afectado. ? No conduzca a su casa desde la clnica o el hospital. Puede conducir 24horas despus del procedimiento, a menos que el mdico le haya indicado lo contrario. ? No opere maquinaria ni herramientas elctricas.  No levante ningn objeto que pese ms de 10libras (4,5kg) o el lmite de peso que le hayan indicado, hasta que el mdico le diga que puede [place].  Pregntele al mdico cundo puede hacer lo siguiente: ? Regresar a la escuela o al [place]. ? Reanudar las actividades fsicas o los deportes que practica habitualmente. ? Reanudar la actividad sexual. Instrucciones generales  Si el lugar de la insercin del catter comienza a Therapist, art, levante el brazo y Advertising account executive presin en el lugar con firmeza. Si la hemorragia no se detiene, pida ayuda de inmediato. Esto es Engineering geologist.  Si regres a Actor que se Nurse, learning disability procedimiento, un adulto responsable debe acompaarlo durante las primeras 24horas posteriores a la llegada a su casa.  Concurra a todas las visitas de control como se lo haya indicado el mdico. Esto es importante. Comunquese con un mdico si:  Tiene fiebre.  Tiene enrojecimiento, hinchazn o una secrecin amarillenta alrededor del lugar de la insercin. Solicite ayuda de inmediato si:  Tiene un dolor que no es habitual en el sitio del radio.  La zona  de insercin del catter se hincha muy rpido.  La zona de insercin sangra y el sangrado no se detiene cuando ejerce presin constante en la zona.  El brazo o la mano se le ponen plidos, fros o siente hormigueo o adormecimiento. Estos sntomas pueden representar un problema grave que constituye Engineer, maintenance (IT). No espere hasta que los sntomas desaparezcan. Solicite atencin  mdica de inmediato. Comunquese con el servicio de emergencias de su localidad (911 en los [place]). No conduzca por sus propios medios [employer]. Resumen  Despus del procedimiento, es frecuente tener moretones y Education officer, environmental a la Soil scientist de insercin del Holiday representative.  Siga las instrucciones del mdico acerca del cuidado de la herida en TEFL teacher de insercin radial. Observe la herida todos los [place] para descartar signos de infeccin.  No levante ningn objeto que pese ms de 10libras (4,5kg) o el lmite de peso que le hayan indicado, hasta que el mdico le diga que puede [place]. Esta informacin no tiene Marine scientist el consejo del mdico. Asegrese de hacerle al mdico cualquier pregunta que tenga. Document Released: 10/06/2010 Document Revised: 08/19/2017 Document Reviewed: 08/19/2017 Elsevier Patient Education  [place] FOR THE REMAINDER OF THE DAY.  DRINK PLENTY OF FLUIDS FOR THE NEXT 2-3 DAYS.  Radial Site Care  This sheet gives you information about how to care for yourself after your procedure. Your health care provider may also give you more specific instructions. If you have problems or questions, contact your health care provider. What can I expect after the procedure? After the procedure, it is common to have:  Bruising and tenderness at the catheter insertion area. Follow these instructions at home: Medicines  Take over-the-counter and prescription medicines only as told by your health care provider. Insertion site care  Follow instructions from your health care provider about how to take care of your insertion site. Make sure you: ? Wash your hands with soap and water before you change your bandage (dressing). If soap and water are not available, use hand sanitizer. ? Change your dressing as told by your health care provider.  Check your insertion site every day for signs of infection. Check  for: ? Redness, swelling, or pain. ? Fluid or blood. ? Pus or a bad smell. ? Warmth.  Do not take baths, swim, or use a hot tub until your health care provider approves.  You may shower 24-48 hours after the procedure. ? Remove the dressing and gently wash the site with plain soap and water. ? Pat the area dry with a clean towel. ? Do not rub the site. That could cause bleeding.  Do not apply powder or lotion to the site. Activity   For 24 hours after the procedure, or as directed by your health care provider: ? Do not flex or bend the affected arm. ? Do not push or pull heavy objects with the affected arm. ? Do not drive yourself home from the hospital or clinic. You may drive 24 hours after the procedure unless your health care provider tells you not to. ? Do not operate machinery or power tools.  Do not lift anything that is heavier than 10 lb for 5 days.  Ask your health care provider when it is okay to: ? Return to work or school. ? Resume usual physical activities or sports. ? Resume sexual activity. General instructions:  If the catheter site starts to bleed, raise your arm  and put firm pressure on the site. If the bleeding does not stop, get help right away. This is a medical emergency.  If you went home on the same day as your procedure, a responsible adult should be with you for the first 24 hours after you arrive home.  Keep all follow-up visits as told by your health care provider. This is important. Contact a health care provider if:  You have a fever.  You have redness, swelling, or yellow drainage around your insertion site. Get help right away if:  You have unusual pain at the radial site.  The catheter insertion area swells very fast.  The insertion area is bleeding, and the bleeding does not stop when you hold steady pressure on the area.  Your arm or hand becomes pale, cool, tingly, or numb. These symptoms may represent a serious problem that is an  emergency. Do not wait to see if the symptoms will go away. Get medical help right away. Call your local emergency services (911 in the [place] Do not drive yourself to the hospital. Summary  After the procedure, it is common to have bruising and tenderness at the site.  Follow instructions from your health care provider about how to take care of your radial site wound. Check the wound every day for signs of infection.  Do not lift anything that is heavier than 10 lb for 5 days.  This information is not intended to replace advice given to you by your health care provider. Make sure you discuss any questions you have with your health care provider. Document Released: 07/14/2010 Document Revised: 07/17/2017 Document Reviewed: 07/17/2017 Elsevier Patient Education  2020 [employer].

## 2019-03-04 NOTE — Progress Notes (Signed)
[facility] BAND REMOVAL  LOCATION:    right radial  DEFLATED PER PROTOCOL:    Yes.    TIME BAND OFF / DRESSING APPLIED:    1030   SITE UPON ARRIVAL:    Level 0  SITE AFTER BAND REMOVAL:    Level 0  CIRCULATION SENSATION AND MOVEMENT:    Within Normal Limits   Yes.    COMMENTS:   tegaderm dsg applied

## 2019-03-04 NOTE — Interval H&P Note (Signed)
History and Physical Interval Note:  03/04/2019 7:22 AM  Cynthia [name]  has presented today for surgery, with the diagnosis of chest pain.  The various methods of treatment have been discussed with the patient and family. After consideration of risks, benefits and other options for treatment, the patient has consented to  Procedure(s): LEFT HEART CATH AND CORONARY ANGIOGRAPHY (N/A) as a surgical intervention.  The patient's history has been reviewed, patient examined, no change in status, stable for surgery.  I have reviewed the patient's chart and labs.  Questions were answered to the patient's satisfaction.   Cath Lab Visit (complete for each Cath Lab visit)  Clinical Evaluation Leading to the Procedure:   ACS: No.  Non-ACS:    Anginal Classification: CCS II  Anti-ischemic medical therapy: Maximal Therapy (2 or more classes of medications)  Non-Invasive Test Results: Intermediate-risk stress test findings: cardiac mortality 1-3%/year  Prior CABG: No previous CABG        Collier Salina [facility] 03/04/2019 7:22 AM

## 2019-03-10 ENCOUNTER — Other Ambulatory Visit (HOSPITAL_COMMUNITY): Payer: Medicare Other

## 2019-03-17 NOTE — Progress Notes (Signed)
Cardiology Office Note   Date:  03/18/2019   ID:  Cynthia [name], DOB 01-02-1971, MRN [id number]  PCP:  Katherina Mires, MD  Cardiologist: Dr. Martinique  CC: Hospital Follow Up   History of Present Illness: Cynthia [name] is a 48 y.o. female who presents for posthospitalization follow-up after having cardiac catheterization in the setting of shortness of breath and chest pain.  She has other history of ESRD previously on dialysis but had a renal transplant in 2014, hypertension, insulin-dependent diabetes, and anemia.  Cardiac catheterization completed on 03/04/2019 by Dr. Martinique revealed minor nonobstructive CAD, normal LV function, and normal LVEDP.  She was to be continued on medical management and risk factor modification.  She comes today to discuss results with an interpreter.  She continues to have occasional sharp discomfort especially when she is stressed or in a hurry.  Otherwise she has no new complaints.  Past Medical History:  Diagnosis Date  . Anemia    when on dialysis  . Anxiety   . Depression   . ESRD on hemodialysis ([place])    Home HD 5x per week- not on dialysis now had tramsplant 4/17  . GERD (gastroesophageal reflux disease)   . History of blood transfusion    transfusion reaction  . Hyperlipidemia   . Hypertension   . 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 Kidney transplant recipient   . Neuromuscular disorder ([place])    NEUROPATHY    Past Surgical History:  Procedure Laterality Date  . ABDOMINAL HYSTERECTOMY    . BREAST LUMPECTOMY WITH RADIOACTIVE SEED LOCALIZATION Right 09/14/2015   Procedure: RIGHT BREAST LUMPECTOMY WITH RADIOACTIVE SEED LOCALIZATION;  Surgeon: Donnie Mesa, MD;  Location: [place];  Service: General;  Laterality: Right;  . BREAST SURGERY Bilateral    biopsy bilateral  . CATARACT EXTRACTION Bilateral    bilateral  . CHOLECYSTECTOMY    .  CYST REMOVAL NECK    . DIALYSIS FISTULA CREATION Left   . EYE SURGERY Bilateral    lazer  . LEFT HEART CATH AND CORONARY ANGIOGRAPHY N/A 03/04/2019   Procedure: LEFT HEART CATH AND CORONARY ANGIOGRAPHY;  Surgeon: Martinique, Peter M, MD;  Location: [place] CV LAB;  Service: Cardiovascular;  Laterality: N/A;  . LEFT HEART CATHETERIZATION WITH CORONARY ANGIOGRAM N/A 03/30/2014   Procedure: LEFT HEART CATHETERIZATION WITH CORONARY ANGIOGRAM;  Surgeon: Sinclair Grooms, MD;  Location: [facility] CATH LAB;  Service: Cardiovascular;  Laterality: N/A;  . LIPOMA EXCISION N/A 02/06/2017   Procedure: EXCISION POSTERIOR NECK SEBACEOUS CYST;  Surgeon: Coralie Keens, MD;  Location: [place];  Service: General;  Laterality: N/A;  . 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 REVISON OF ARTERIOVENOUS FISTULA Left 09/28/2013   Procedure: EXCISE ESCHAR LEFT ARM  ARTERIOVENOUS FISTULA WITH PLICATION OF LEFT ARM ARTERIOVENOUS FISTULA;  Surgeon: Elam Dutch, MD;  Location: [place];  Service: Vascular;  Laterality: Left;  . REVISON OF ARTERIOVENOUS FISTULA Left 08/26/2015   Procedure: RESECTION ANEURYSM OF LEFT ARM ARTERIOVENOUS FISTULA  ;  Surgeon: Serafina Mitchell, MD;  Location: [place];  Service: Vascular;  Laterality: Left;  . TUBAL LIGATION       Current Outpatient Medications  Medication Sig Dispense Refill  . amLODipine (NORVASC) 5  MG tablet Take 10 mg by mouth daily.     Marland Kitchen aspirin EC 81 MG EC tablet Take 1 tablet (81 mg total) by mouth daily. 90 tablet 3  . B Complex-C (SUPER B COMPLEX PO) Take 1 tablet by mouth at bedtime.    . carvedilol (COREG) 25 MG tablet Take 12.5 mg by mouth 2 (two) times daily with a meal.     . cetirizine (ZYRTEC) 10 MG tablet Take 5 mg by mouth daily. Takes 0.5 tablet daily    . Cholecalciferol (VITAMIN D3) 1000 units CAPS Take  1,000 Units by mouth daily.     Marland Kitchen docusate sodium (COLACE) 100 MG capsule Take 100 mg by mouth 2 (two) times daily.    . fludrocortisone (FLORINEF) 0.1 MG tablet Take 0.1 mg by mouth daily.    Marland Kitchen FLUoxetine (PROZAC) 40 MG capsule Take 40 mg by mouth daily.    Marland Kitchen gabapentin (NEURONTIN) 300 MG capsule Take 300 mg by mouth at bedtime.  12  . glucose blood (ONETOUCH VERIO) test strip 1 each by Other route 2 (two) times daily. And lancets+alcohol pads 2/day 100 each 12  . Insulin Glargine (LANTUS SOLOSTAR) 100 UNIT/ML Solostar Pen Inject 25 Units into the skin every morning. 5 pen 11  . magnesium oxide (MAG-OX) 400 MG tablet Take 400 mg by mouth 2 (two) times daily.     . mycophenolate (MYFORTIC) 180 MG EC tablet Take 360 mg by mouth 2 (two) times daily.    . pantoprazole (PROTONIX) 40 MG tablet Take 40 mg by mouth daily.  5  . predniSONE (DELTASONE) 5 MG tablet TAKE 1 TABLET DAILY  2  . rosuvastatin (CRESTOR) 5 MG tablet Take 5 mg by mouth at bedtime.    . Tacrolimus ER (ENVARSUS XR) 1 MG TB24 Take 3 mg by mouth every morning.    . vitamin E 400 UNIT capsule Take 400 Units by mouth daily.     Current Facility-Administered Medications  Medication Dose Route Frequency Provider Last Rate Last Dose  . sodium chloride flush (NS) 0.9 % injection 3 mL  3 mL Intravenous Q12H Martinique, Peter M, MD        Allergies:   Other and Tape    Social History:  The patient  reports that she has never smoked. She has never used smokeless tobacco. She reports that she does not drink alcohol or use drugs.   Family History:  The patient's family history includes Asthma in her sister; Cancer in her maternal grandmother; Diabetes in her brother, father, maternal grandfather, mother, paternal grandmother, and sister; Heart disease in her father; Kidney disease in her father and mother; Lupus in her sister.    ROS: All other systems are reviewed and negative. Unless otherwise mentioned in H&P    PHYSICAL EXAM: VS:  BP  132/60   Pulse 67   Temp (!) 96.4 F (35.8 C)   Ht 5\' 2"  (1.575 m)   Wt 182 lb (82.6 kg)   SpO2 97%   BMI 33.29 kg/m  , BMI Body mass index is 33.29 kg/m. GEN: Well nourished, well developed, in no acute distress HEENT: normal Neck: no JVD, carotid bruits, or masses Cardiac: RRR; AV fistula flow murmur is noted,rubs, or gallops,no edema  Respiratory:  Clear to auscultation bilaterally, normal work of breathing GI: soft, nontender, nondistended, + BS MS: no deformity or atrophy.  Catheter insertion site to right wrist is well-healed with mild ecchymosis no pain or diminished ROM. Skin: warm  and dry, no rash Neuro:  Strength and sensation are intact Psych: euthymic mood, full affect   EKG: Not needed   Recent Labs: 02/27/2019: BUN 19; Creatinine, Ser 1.11; Hemoglobin 13.5; Platelets 186; Potassium 4.7; Sodium 136    Lipid Panel    Component Value Date/Time   CHOL 162 03/29/2014 0616   TRIG 284 (H) 03/29/2014 0616   HDL 31 (L) 03/29/2014 0616   CHOLHDL 5.2 03/29/2014 0616   VLDL 57 (H) 03/29/2014 0616   LDLCALC 74 03/29/2014 0616      Wt Readings from Last 3 Encounters:  03/18/19 182 lb (82.6 kg)  03/04/19 178 lb (80.7 kg)  02/19/19 179 lb (81.2 kg)      Other studies Reviewed: Cardiac Cath 03/04/2019   Prox LAD to Mid LAD lesion is 10% stenosed.  Prox Cx to Mid Cx lesion is 20% stenosed.  1st Mrg lesion is 20% stenosed.  Prox RCA to Mid RCA lesion is 10% stenosed.  The left ventricular systolic function is normal.  LV end diastolic pressure is normal.  The left ventricular ejection fraction is 55-65% by visual estimate.   1. Minor nonobstructive CAD 2. Normal LV function 3. Normal LVEDP  Plan: medical management and risk factor modification  ASSESSMENT AND PLAN:  1.  Chest pain: Cardiac catheterization revealed minimal coronary artery disease.  She will continue on risk factor modification with blood pressure control and low-cholesterol diet.  She  will return to cardiology as needed. Should seek recommendations from PCP for other etiologies of chest discomfort.   2. Hypertension: Well controlled. Continue current regimen.  3. Hyperlipidemia: No changes in regimen. She will continue low cholesterol diet.   Current medicines are reviewed at length with the patient today.    Labs/ tests ordered today include: None  Phill Myron. West Pugh, ANP, [facility]   03/18/2019 11:24 AM    [facility] [address] Office [phone] Fax [phone]

## 2019-03-18 ENCOUNTER — Other Ambulatory Visit: Payer: Self-pay

## 2019-03-18 ENCOUNTER — Ambulatory Visit (INDEPENDENT_AMBULATORY_CARE_PROVIDER_SITE_OTHER): Payer: Medicare Other | Admitting: Adult Health

## 2019-03-18 ENCOUNTER — Encounter: Payer: Self-pay | Admitting: Adult Health

## 2019-03-18 VITALS — BP 132/60 | HR 67 | Temp 96.4°F | Ht 62.0 in | Wt 182.0 lb

## 2019-03-18 DIAGNOSIS — I10 Essential (primary) hypertension: Secondary | ICD-10-CM

## 2019-03-18 DIAGNOSIS — I209 Angina pectoris, unspecified: Secondary | ICD-10-CM

## 2019-03-18 DIAGNOSIS — E7800 Pure hypercholesterolemia, unspecified: Secondary | ICD-10-CM

## 2019-03-18 DIAGNOSIS — R0789 Other chest pain: Secondary | ICD-10-CM

## 2019-03-18 NOTE — Patient Instructions (Signed)

## 2019-03-19 ENCOUNTER — Other Ambulatory Visit: Payer: Self-pay | Admitting: Family Medicine

## 2019-03-19 DIAGNOSIS — N644 Mastodynia: Secondary | ICD-10-CM

## 2019-03-23 ENCOUNTER — Other Ambulatory Visit (HOSPITAL_COMMUNITY): Payer: Medicare Other

## 2019-03-26 ENCOUNTER — Ambulatory Visit
Admission: RE | Admit: 2019-03-26 | Discharge: 2019-03-26 | Disposition: A | Discharge status: Home / Self Care | Payer: Medicare Other | Source: Ambulatory Visit | Attending: Family Medicine | Admitting: Family Medicine

## 2019-03-26 ENCOUNTER — Other Ambulatory Visit: Payer: Self-pay

## 2019-03-26 DIAGNOSIS — N644 Mastodynia: Secondary | ICD-10-CM

## 2019-04-10 ENCOUNTER — Other Ambulatory Visit: Payer: Self-pay

## 2019-04-14 ENCOUNTER — Encounter: Payer: Self-pay | Admitting: Endocrinology

## 2019-04-14 ENCOUNTER — Ambulatory Visit (INDEPENDENT_AMBULATORY_CARE_PROVIDER_SITE_OTHER): Payer: Medicare Other | Admitting: Endocrinology

## 2019-04-14 ENCOUNTER — Other Ambulatory Visit: Payer: Self-pay

## 2019-04-14 VITALS — BP 104/0 | HR 70 | Ht 62.0 in | Wt 178.6 lb

## 2019-04-14 DIAGNOSIS — E119 Type 2 diabetes mellitus without complications: Secondary | ICD-10-CM

## 2019-04-14 DIAGNOSIS — I209 Angina pectoris, unspecified: Secondary | ICD-10-CM

## 2019-04-14 LAB — POCT GLYCOSYLATED HEMOGLOBIN (HGB A1C): Hemoglobin A1C: 11.1 % — AB (ref 4.0–5.6)

## 2019-04-14 MED ORDER — LANTUS SOLOSTAR 100 UNIT/ML ~~LOC~~ SOPN: 30.0000 [IU] | PEN_INJECTOR | SUBCUTANEOUS | 11 refills | Status: DC

## 2019-04-14 MED ORDER — ONETOUCH VERIO VI STRP: 1.0000 | ORAL_STRIP | Freq: Two times a day (BID) | 3 refills | Status: DC

## 2019-04-14 NOTE — Progress Notes (Signed)
Subjective:    Patient ID: Cynthia [name], female    DOB: 05/04/1971, 48 y.o.   MRN: [id number]  HPI Pt returns for f/u of diabetes mellitus:  DM type: Insulin-requiring type 2. Dx'ed: 1245 Complications: DR, CAD, and ESRD (transplant 2017) Therapy: insulin since soon after dx GDM: 1990 DKA: never Severe hypoglycemia: last episode was mid-2019 Pancreatitis: never Pancreatic imaging: normal on 2017 CT Other: she declined pump rx, due to high copay; she is on multiple daily injections now, but due to h/o noncompliance, she takes QD insulin, after poor results with multiple daily injections.   Interval history: Pt says she never misses the insulin.  She takes 25 units qam.  no cbg record, but states cbg's varies from 130-225.   Past Medical History:  Diagnosis Date  . Anemia    when on dialysis  . Anxiety   . Depression   . ESRD on hemodialysis ([place])    Home HD 5x per week- not on dialysis now had tramsplant 4/17  . GERD (gastroesophageal reflux disease)   . History of blood transfusion    transfusion reaction  . Hyperlipidemia   . Hypertension   . 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 per pt-dr levy also II  . Kidney transplant recipient   . Neuromuscular disorder ([place])    NEUROPATHY    Past Surgical History:  Procedure Laterality Date  . ABDOMINAL HYSTERECTOMY    . BREAST EXCISIONAL BIOPSY Right 08/2015  . BREAST LUMPECTOMY WITH RADIOACTIVE SEED LOCALIZATION Right 09/14/2015   Procedure: RIGHT BREAST LUMPECTOMY WITH RADIOACTIVE SEED LOCALIZATION;  Surgeon: Donnie Mesa, MD;  Location: [place];  Service: General;  Laterality: Right;  . BREAST SURGERY Bilateral    biopsy bilateral  . CATARACT EXTRACTION Bilateral    bilateral  . CHOLECYSTECTOMY    . CYST REMOVAL NECK    . DIALYSIS FISTULA CREATION Left   . EYE SURGERY Bilateral    [facility]azer  . LEFT HEART CATH AND CORONARY ANGIOGRAPHY N/A 03/04/2019    Procedure: LEFT HEART CATH AND CORONARY ANGIOGRAPHY;  Surgeon: Martinique, Peter M, MD;  Location: [place] [facility];  Service: Cardiovascular;  Laterality: N/A;  . LEFT HEART CATHETERIZATION WITH CORONARY ANGIOGRAM N/A 03/30/2014   Procedure: LEFT HEART CATHETERIZATION WITH CORONARY ANGIOGRAM;  Surgeon: Sinclair Grooms, MD;  Location[facility] CATH [facility];  Service: Cardiovascular;  Laterality: N/A;  . LIPOMA EXCISION N/A 02/06/2017   Procedure: EXCISION POSTERIOR NECK SEBACEOUS CYST;  Surgeon: Coralie Keens, MD;  Location: [place];  Service: General;  Laterality: N/A;  . 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 REVISON OF ARTERIOVENOUS FISTULA Left 09/28/2013   Procedure: EXCISE ESCHAR LEFT ARM  ARTERIOVENOUS FISTULA WITH PLICATION OF LEFT ARM ARTERIOVENOUS FISTULA;  Surgeon: Elam Dutch, MD;  Location: [place];  Service: Vascular;  Laterality: Left;  . REVISON OF ARTERIOVENOUS FISTULA Left 08/26/2015   Procedure: RESECTION ANEURYSM OF LEFT ARM ARTERIOVENOUS FISTULA  ;  Surgeon: Serafina Mitchell, MD;  Location: [place];  Service: Vascular;  Laterality: Left;  . TUBAL LIGATION      Social History   Socioeconomic History  . Marital status: Legally Separated    Spouse name: Not on file  . Number of children: 3  . Years of education: 9  .  Highest education level: Not on file  Occupational History  . Occupation: disabled  Social Needs  . Financial resource strain: Not on file  . Food insecurity    Worry: Not on file    Inability: Not on file  . Transportation needs    Medical: Not on file    Non-medical: Not on file  Tobacco Use  . Smoking status: Never Smoker  . Smokeless tobacco: Never Used  Substance and Sexual Activity  . Alcohol use: No  . Drug use: No  . Sexual activity: Yes  Lifestyle  . Physical activity    Days per week:  Not on file    Minutes per session: Not on file  . Stress: Not on file  Relationships  . Social Herbalist on phone: Not on file    Gets together: Not on file    Attends religious service: Not on file    Active member of club or organization: Not on file    Attends meetings of clubs or organizations: Not on file    Relationship status: Not on file  . Intimate partner violence    Fear of current or ex partner: Not on file    Emotionally abused: Not on file    Physically abused: Not on file    Forced sexual activity: Not on file  Other Topics Concern  . Not on file  Social History Narrative   Lives in home with daughters, grand daughter, son-in-law   Caffeine use - 1 cup coffee sometimes     Current Outpatient Medications on File Prior to Visit  Medication Sig Dispense Refill  . amLODipine (NORVASC) 5 MG tablet Take 10 mg by mouth daily.     Marland Kitchen aspirin EC 81 MG EC tablet Take 1 tablet (81 mg total) by mouth daily. 90 tablet 3  . B Complex-C (SUPER B COMPLEX PO) Take 1 tablet by mouth at bedtime.    . carvedilol (COREG) 25 MG tablet Take 12.5 mg by mouth 2 (two) times daily with a meal.     . cetirizine (ZYRTEC) 10 MG tablet Take 5 mg by mouth daily. Takes 0.5 tablet daily    . Cholecalciferol (VITAMIN D3) 1000 units CAPS Take 1,000 Units by mouth daily.     Marland Kitchen docusate sodium (COLACE) 100 MG capsule Take 100 mg by mouth 2 (two) times daily.    . fludrocortisone (FLORINEF) 0.1 MG tablet Take 0.1 mg by mouth daily.    Marland Kitchen FLUoxetine (PROZAC) 40 MG capsule Take 40 mg by mouth daily.    Marland Kitchen gabapentin (NEURONTIN) 300 MG capsule Take 300 mg by mouth at bedtime.  12  . magnesium oxide (MAG-OX) 400 MG tablet Take 400 mg by mouth 2 (two) times daily.     . mycophenolate (MYFORTIC) 180 MG EC tablet Take 360 mg by mouth 2 (two) times daily.    . pantoprazole (PROTONIX) 40 MG tablet Take 40 mg by mouth daily.  5  . predniSONE (DELTASONE) 5 MG tablet TAKE 1 TABLET DAILY  2  .  rosuvastatin (CRESTOR) 5 MG tablet Take 5 mg by mouth at bedtime.    . Tacrolimus ER (ENVARSUS XR) 1 MG TB24 Take 3 mg by mouth every morning.    . vitamin E 400 UNIT capsule Take 400 Units by mouth daily.     Current Facility-Administered Medications on File Prior to Visit  Medication Dose Route Frequency Provider Last Rate Last Dose  . sodium chloride flush (NS)  0.9 % injection 3 mL  3 mL Intravenous Q12H Martinique, Peter M, MD        Allergies  Allergen Reactions  . Other Rash    Burns skin.Please use paper tape only Burns skin.Please use paper tape only  . Tape Other (See Comments)    Burns skin.  Please use paper tape only    Family History  Problem Relation Age of Onset  . Cancer Maternal Grandmother   . Diabetes Maternal Grandfather   . Diabetes Paternal Grandmother   . Diabetes Mother   . Kidney disease Mother   . Diabetes Father   . Heart disease Father   . Kidney disease Father   . Diabetes Sister   . Asthma Sister   . Lupus Sister   . Diabetes Brother     BP (!) 104/0 (BP Location: Right Arm, Patient Position: Sitting, Cuff Size: Large) Comment: Prior to cuff inflation, pulse auscultated w/ stethescope  Pulse 70   Ht 5\' 2"  (1.575 m)   Wt 178 lb 9.6 oz (81 kg)   SpO2 95%   BMI 32.67 kg/m   Review of Systems She denies hypoglycemia.      Objective:   Physical Exam VITAL SIGNS:  See vs page GENERAL: no distress Pulses: dorsalis pedis intact bilat.   MSK: no deformity of the feet CV: no leg edema Skin:  no ulcer on the feet.  normal color and temp on the feet. Neuro: sensation is intact to touch on the feet  Lab Results  Component Value Date   HGBA1C 11.1 (A) 04/14/2019       Assessment & Plan:  Insulin-requiring type 2 DM, with CAD: worse.  Wide pulse pressure.  Continue f/u with cardiol.   Patient Instructions  check your blood sugar twice a day.  vary the time of day when you check, between before the 3 meals, and at bedtime.  also check if  you have symptoms of your blood sugar being too high or too low.  please keep a record of the readings and bring it to your next appointment here (or you can bring the meter itself).  You can write it on any piece of paper.  please call us sooner if your blood sugar goes below 70, or if you have a lot of readings over 200. I have sent a prescription to your pharmacy, to increase the Lantus to 30 units each morning On this type of insulin schedule, you should eat meals on a regular schedule.  If a meal is missed or significantly delayed, your blood sugar could go low. Please come back for a follow-up appointment in 2 months.

## 2019-04-14 NOTE — Patient Instructions (Addendum)
check your blood sugar twice a day.  vary the time of day when you check, between before the 3 meals, and at bedtime.  also check if you have symptoms of your blood sugar being too high or too low.  please keep a record of the readings and bring it to your next appointment here (or you can bring the meter itself).  You can write it on any piece of paper.  please call us sooner if your blood sugar goes below 70, or if you have a lot of readings over 200. I have sent a prescription to your pharmacy, to increase the Lantus to 30 units each morning On this type of insulin schedule, you should eat meals on a regular schedule.  If a meal is missed or significantly delayed, your blood sugar could go low. Please come back for a follow-up appointment in 2 months.

## 2019-04-21 ENCOUNTER — Telehealth: Payer: Self-pay

## 2019-04-21 NOTE — Telephone Encounter (Signed)
Company: [facility]  Document: [facility] glucose supplies Other records requested: None  All above requested information has been faxed successfully to [employer] listed above. Documents and fax confirmation have been placed in the faxed file for future reference.

## 2019-04-29 ENCOUNTER — Telehealth: Payer: Self-pay

## 2019-04-29 NOTE — Telephone Encounter (Signed)
Company: [facility]  Document: [employer] testing supplies Other records requested: None requested  All above requested information has been faxed successfully to [employer] listed above. Documents and fax confirmation have been placed in the faxed file for future reference.

## 2019-06-01 ENCOUNTER — Encounter (HOSPITAL_COMMUNITY): Payer: Self-pay | Admitting: Emergency Medicine

## 2019-06-01 ENCOUNTER — Emergency Department (HOSPITAL_COMMUNITY): Payer: Medicare Other

## 2019-06-01 ENCOUNTER — Inpatient Hospital Stay (HOSPITAL_COMMUNITY)
Admission: EM | Admit: 2019-06-01 | Discharge: 2019-06-06 | DRG: 177 | Disposition: A | Discharge status: Home / Self Care | Payer: Medicare Other | Attending: Internal Medicine | Admitting: Internal Medicine

## 2019-06-01 DIAGNOSIS — Z91128 Patient's intentional underdosing of medication regimen for other reason: Secondary | ICD-10-CM

## 2019-06-01 DIAGNOSIS — U071 COVID-19: Secondary | ICD-10-CM

## 2019-06-01 DIAGNOSIS — J1289 Other viral pneumonia: Secondary | ICD-10-CM | POA: Diagnosis present

## 2019-06-01 DIAGNOSIS — E669 Obesity, unspecified: Secondary | ICD-10-CM | POA: Diagnosis present

## 2019-06-01 DIAGNOSIS — R739 Hyperglycemia, unspecified: Secondary | ICD-10-CM | POA: Diagnosis present

## 2019-06-01 DIAGNOSIS — Z794 Long term (current) use of insulin: Secondary | ICD-10-CM

## 2019-06-01 DIAGNOSIS — E875 Hyperkalemia: Secondary | ICD-10-CM | POA: Diagnosis present

## 2019-06-01 DIAGNOSIS — Z832 Family history of diseases of the blood and blood-forming organs and certain disorders involving the immune mechanism: Secondary | ICD-10-CM

## 2019-06-01 DIAGNOSIS — Z6832 Body mass index (BMI) 32.0-32.9, adult: Secondary | ICD-10-CM

## 2019-06-01 DIAGNOSIS — N179 Acute kidney failure, unspecified: Secondary | ICD-10-CM | POA: Diagnosis present

## 2019-06-01 DIAGNOSIS — Z833 Family history of diabetes mellitus: Secondary | ICD-10-CM

## 2019-06-01 DIAGNOSIS — Z825 Family history of asthma and other chronic lower respiratory diseases: Secondary | ICD-10-CM

## 2019-06-01 DIAGNOSIS — Y830 Surgical operation with transplant of whole organ as the cause of abnormal reaction of the patient, or of later complication, without mention of misadventure at the time of the procedure: Secondary | ICD-10-CM | POA: Diagnosis present

## 2019-06-01 DIAGNOSIS — T383X6A Underdosing of insulin and oral hypoglycemic [antidiabetic] drugs, initial encounter: Secondary | ICD-10-CM | POA: Diagnosis present

## 2019-06-01 DIAGNOSIS — E1110 Type 2 diabetes mellitus with ketoacidosis without coma: Secondary | ICD-10-CM | POA: Diagnosis present

## 2019-06-01 DIAGNOSIS — R110 Nausea: Secondary | ICD-10-CM

## 2019-06-01 DIAGNOSIS — E785 Hyperlipidemia, unspecified: Secondary | ICD-10-CM | POA: Diagnosis present

## 2019-06-01 DIAGNOSIS — T8619 Other complication of kidney transplant: Secondary | ICD-10-CM | POA: Diagnosis present

## 2019-06-01 DIAGNOSIS — E1140 Type 2 diabetes mellitus with diabetic neuropathy, unspecified: Secondary | ICD-10-CM | POA: Diagnosis present

## 2019-06-01 DIAGNOSIS — E119 Type 2 diabetes mellitus without complications: Secondary | ICD-10-CM

## 2019-06-01 DIAGNOSIS — Z940 Kidney transplant status: Secondary | ICD-10-CM

## 2019-06-01 DIAGNOSIS — K5790 Diverticulosis of intestine, part unspecified, without perforation or abscess without bleeding: Secondary | ICD-10-CM | POA: Diagnosis present

## 2019-06-01 DIAGNOSIS — R300 Dysuria: Secondary | ICD-10-CM | POA: Diagnosis not present

## 2019-06-01 DIAGNOSIS — Z7982 Long term (current) use of aspirin: Secondary | ICD-10-CM

## 2019-06-01 DIAGNOSIS — E11649 Type 2 diabetes mellitus with hypoglycemia without coma: Secondary | ICD-10-CM | POA: Diagnosis not present

## 2019-06-01 DIAGNOSIS — I10 Essential (primary) hypertension: Secondary | ICD-10-CM | POA: Diagnosis present

## 2019-06-01 DIAGNOSIS — Z7952 Long term (current) use of systemic steroids: Secondary | ICD-10-CM

## 2019-06-01 DIAGNOSIS — R7401 Elevation of levels of liver transaminase levels: Secondary | ICD-10-CM | POA: Diagnosis present

## 2019-06-01 DIAGNOSIS — E871 Hypo-osmolality and hyponatremia: Secondary | ICD-10-CM | POA: Diagnosis present

## 2019-06-01 DIAGNOSIS — Z841 Family history of disorders of kidney and ureter: Secondary | ICD-10-CM

## 2019-06-01 DIAGNOSIS — Z8249 Family history of ischemic heart disease and other diseases of the circulatory system: Secondary | ICD-10-CM

## 2019-06-01 LAB — POCT I-STAT EG7
Acid-base deficit: 3 mmol/L — ABNORMAL HIGH (ref 0.0–2.0)
Bicarbonate: 21.2 mmol/L (ref 20.0–28.0)
Calcium, Ion: 1.3 mmol/L (ref 1.15–1.40)
HCT: 43 % (ref 36.0–46.0)
Hemoglobin: 14.6 g/dL (ref 12.0–15.0)
O2 Saturation: 99 %
Potassium: 5.7 mmol/L — ABNORMAL HIGH (ref 3.5–5.1)
Sodium: 126 mmol/L — ABNORMAL LOW (ref 135–145)
TCO2: 22 mmol/L (ref 22–32)
pCO2, Ven: 35.7 mmHg — ABNORMAL LOW (ref 44.0–60.0)
pH, Ven: 7.382 (ref 7.250–7.430)
pO2, Ven: 145 mmHg — ABNORMAL HIGH (ref 32.0–45.0)

## 2019-06-01 LAB — CBC
HCT: 41.8 % (ref 36.0–46.0)
Hemoglobin: 14.5 g/dL (ref 12.0–15.0)
MCH: 31.1 pg (ref 26.0–34.0)
MCHC: 34.7 g/dL (ref 30.0–36.0)
MCV: 89.7 fL (ref 80.0–100.0)
Platelets: 147 10*3/uL — ABNORMAL LOW (ref 150–400)
RBC: 4.66 MIL/uL (ref 3.87–5.11)
RDW: 11.9 % (ref 11.5–15.5)
WBC: 5.7 10*3/uL (ref 4.0–10.5)
nRBC: 0 % (ref 0.0–0.2)

## 2019-06-01 LAB — URINALYSIS, ROUTINE W REFLEX MICROSCOPIC
Bilirubin Urine: NEGATIVE
Glucose, UA: >=500 mg/dL — AB
Hgb urine dipstick: NEGATIVE
Ketones, ur: 5 mg/dL — AB
Leukocytes,Ua: NEGATIVE
Nitrite: NEGATIVE
Protein, ur: NEGATIVE mg/dL
Specific Gravity, Urine: 1.029 (ref 1.005–1.030)
pH: 5 (ref 5.0–8.0)

## 2019-06-01 LAB — COMPREHENSIVE METABOLIC PANEL
ALT: 51 U/L — ABNORMAL HIGH (ref 0–44)
AST: 46 U/L — ABNORMAL HIGH (ref 15–41)
Albumin: 3.6 g/dL (ref 3.5–5.0)
Alkaline Phosphatase: 84 U/L (ref 38–126)
Anion gap: 11 (ref 5–15)
BUN: 26 mg/dL — ABNORMAL HIGH (ref 6–20)
CO2: 22 mmol/L (ref 22–32)
Calcium: 10 mg/dL (ref 8.9–10.3)
Chloride: 93 mmol/L — ABNORMAL LOW (ref 98–111)
Creatinine, Ser: 1.51 mg/dL — ABNORMAL HIGH (ref 0.44–1.00)
GFR calc Af Amer: 46 mL/min — ABNORMAL LOW (ref >60)
GFR calc non Af Amer: 40 mL/min — ABNORMAL LOW (ref >60)
Glucose, Bld: 541 mg/dL (ref 70–99)
Potassium: 4.9 mmol/L (ref 3.5–5.1)
Sodium: 126 mmol/L — ABNORMAL LOW (ref 135–145)
Total Bilirubin: 0.9 mg/dL (ref 0.3–1.2)
Total Protein: 6.7 g/dL (ref 6.5–8.1)

## 2019-06-01 LAB — I-STAT BETA HCG BLOOD, ED (MC, WL, AP ONLY): I-stat hCG, quantitative: <5 m[IU]/mL (ref <5)

## 2019-06-01 LAB — CBG MONITORING, ED
Glucose-Capillary: 461 mg/dL — ABNORMAL HIGH (ref 70–99)
Glucose-Capillary: 504 mg/dL (ref 70–99)

## 2019-06-01 LAB — LIPASE, BLOOD: Lipase: 17 U/L (ref 11–51)

## 2019-06-01 MED ORDER — ACETAMINOPHEN 325 MG PO TABS
650.0000 mg | ORAL_TABLET | Freq: Once | ORAL | Status: AC
  Administered 2019-06-01: 650 mg via ORAL
  Filled 2019-06-01: qty 2

## 2019-06-01 MED ORDER — POTASSIUM CHLORIDE 10 MEQ/100ML IV SOLN
10.0000 meq | INTRAVENOUS | Status: DC
  Filled 2019-06-01: qty 100

## 2019-06-01 MED ORDER — DEXTROSE 50 % IV SOLN
0.0000 mL | INTRAVENOUS | Status: DC | PRN
  Filled 2019-06-01: qty 50

## 2019-06-01 MED ORDER — SODIUM CHLORIDE 0.9 % IV SOLN
INTRAVENOUS | Status: DC
  Administered 2019-06-02: 01:00:00 via INTRAVENOUS

## 2019-06-01 MED ORDER — DEXTROSE-NACL 5-0.45 % IV SOLN
INTRAVENOUS | Status: DC
  Administered 2019-06-02: 05:00:00 via INTRAVENOUS

## 2019-06-01 MED ORDER — SODIUM CHLORIDE 0.9 % IV BOLUS
500.0000 mL | Freq: Once | INTRAVENOUS | Status: AC
  Administered 2019-06-01: 500 mL via INTRAVENOUS

## 2019-06-01 MED ORDER — SODIUM CHLORIDE 0.9 % IV BOLUS
500.0000 mL | Freq: Once | INTRAVENOUS | Status: AC
  Administered 2019-06-02: 500 mL via INTRAVENOUS

## 2019-06-01 MED ORDER — INSULIN REGULAR(HUMAN) IN NACL 100-0.9 UT/100ML-% IV SOLN
INTRAVENOUS | Status: DC
  Administered 2019-06-02: 8.5 [IU]/h via INTRAVENOUS
  Filled 2019-06-01: qty 100

## 2019-06-01 NOTE — ED Notes (Signed)
Aren Cherne [phone] daughter looking for an update on patient

## 2019-06-01 NOTE — ED Notes (Signed)
Pt providing urine sample at this time.

## 2019-06-01 NOTE — ED Triage Notes (Signed)
Pt states was tested + for covid on Monday reports body aches and fatigue also has burning with urination now. Daughter is also concerned because she has missed a few of her anti refection pills she takes from her kidney transplant. Denies any CP or SOB.

## 2019-06-01 NOTE — ED Provider Notes (Signed)
[facility] EMERGENCY DEPARTMENT Provider Note   CSN: [id number] Arrival date & time: 06/01/19  1750     History   Chief Complaint Chief Complaint  Patient presents with   covid +   Urinary Tract Infection    HPI Temiloluwa Recchia is a 48 y.o. female.     HPI  Due to language barrier, an interpreter was present during the history-taking and subsequent discussion (and for part of the physical exam) with this patient.  Pt is a 48 year old female with PMH of ESRD s/p renal transplant in 2017, DMI, HTN, HLD who presents to the ED with concern for dysuria.  Patient reports on 12/1 she was diagnosed with Covid.  She states since that time she has been having body aches, headaches, fatigue and malaise.  Patient has also had subjective fever.  She states over the last few days she has had nausea and some worsening right-sided abdominal pain and has been unable to take her medications and has not been eating or drinking much.  Patient reports she has positive sick contacts at home.  She reports she had a cough but no difficulty breathing.  She denies any chest pain.  Of note, patient has had a renal transplant and reports she has not been able take her transplant medications in the last 3 days and has not taken her insulin either because she has not been eating.  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 per pt-dr levy also II   Kidney transplant recipient    Neuromuscular disorder [facility])    NEUROPATHY    Patient Active Problem List   Diagnosis Date Noted   Hyperglycemia 06/02/2019    HTN (hypertension) 03/04/2019   Abnormal nuclear stress test 03/04/2019   History of renal transplant 11/18/2015   Postprandial vomiting    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   Anemia in CKD (chronic kidney disease) 01/04/2015   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   Hyperkalemia 11/16/2013   Chronic constipation 07/28/2013   Paresthesias 07/28/2013   Insulin-dependent diabetes mellitus with renal complications    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/11/2013   Precordial chest pain 02/10/2013   Awaiting organ transplant 09/19/2012    Past Surgical History:  Procedure Laterality Date   ABDOMINAL HYSTERECTOMY     BREAST EXCISIONAL BIOPSY Right 08/2015   BREAST LUMPECTOMY WITH RADIOACTIVE SEED LOCALIZATION Right 09/14/2015   Procedure: RIGHT BREAST LUMPECTOMY WITH RADIOACTIVE SEED LOCALIZATION;  Surgeon: Donnie Mesa, MD;  Location: [place];  Service: General;  Laterality: Right;   BREAST SURGERY Bilateral    biopsy bilateral   CATARACT EXTRACTION Bilateral    bilateral   CHOLECYSTECTOMY     CYST REMOVAL NECK     DIALYSIS FISTULA CREATION Left    EYE SURGERY Bilateral    lazer   LEFT HEART CATH AND CORONARY ANGIOGRAPHY N/A 03/04/2019   Procedure: LEFT HEART CATH AND CORONARY ANGIOGRAPHY;  Surgeon: Martinique, Peter M, MD;  Location: [place] [facility];  Service: Cardiovascular;  Laterality: N/A;  LEFT HEART CATHETERIZATION WITH CORONARY ANGIOGRAM N/A 03/30/2014   Procedure: LEFT HEART CATHETERIZATION WITH CORONARY ANGIOGRAM;  Surgeon: Sinclair Grooms, MD;  Location: [facility] CATH LAB;  Service: Cardiovascular;  Laterality: N/A;   LIPOMA EXCISION N/A 02/06/2017   Procedure: EXCISION POSTERIOR NECK SEBACEOUS CYST;  Surgeon: Coralie Keens, MD;  Location: [place];  Service: General;  Laterality: N/A;   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   Procedure: REPAIR OF ARTERIOVENOUS FISTULA ANEURYSM;  Surgeon: Serafina Mitchell,  MD;  Location: [facility];  Service: Vascular;  Laterality: Left;   REVISON OF ARTERIOVENOUS FISTULA Left 09/28/2013   Procedure: EXCISE ESCHAR LEFT ARM  ARTERIOVENOUS FISTULA WITH PLICATION OF LEFT ARM ARTERIOVENOUS FISTULA;  Surgeon: Elam Dutch, MD;  Location: [place];  Service: Vascular;  Laterality: Left;   REVISON OF ARTERIOVENOUS FISTULA Left 08/26/2015   Procedure: RESECTION ANEURYSM OF LEFT ARM ARTERIOVENOUS FISTULA  ;  Surgeon: Serafina Mitchell, MD;  Location: [facility] OR;  Service: Vascular;  Laterality: Left;   TUBAL LIGATION       OB History   No obstetric history on file.      Home Medications    Prior to Admission medications   Medication Sig Start Date End Date Taking? Authorizing Provider  amLODipine (NORVASC) 10 MG tablet Take 10 mg by mouth daily. 05/12/19  Yes [provider]  aspirin EC 81 MG EC tablet Take 1 tablet (81 mg total) by mouth daily. Patient taking differently: Take 81 mg by mouth at bedtime.  03/31/14  Yes Phelps, Aviva Signs Y, DO  B Complex-C (SUPER B COMPLEX PO) Take 1 tablet by mouth daily.    Yes [provider]  carvedilol (COREG) 25 MG tablet Take 12.5 mg by mouth 2 (two) times daily with a meal.  11/23/15  Yes [provider]  cetirizine (ZYRTEC) 10 MG tablet Take 5 mg by mouth daily. Takes 0.5 tablet daily   Yes [provider]  Cholecalciferol (VITAMIN D3) 1000 units CAPS Take 1,000 Units by mouth daily.  02/16/16  Yes [provider]  docusate sodium (COLACE) 100 MG capsule Take 100 mg by mouth 2 (two) times daily.   Yes [provider]  fludrocortisone (FLORINEF) 0.1 MG tablet Take 0.1 mg by mouth daily.   Yes [provider]  FLUoxetine (PROZAC) 40 MG capsule Take 40 mg by mouth daily.   Yes [provider]  gabapentin (NEURONTIN) 300 MG capsule Take 300 mg by mouth at bedtime. 11/16/14  Yes [provider]  glucose blood (ONETOUCH VERIO) test strip 1 each by Other route 2 (two)  times daily. And lancets 2/day 04/14/19  Yes Renato Shin, MD  Insulin Glargine (LANTUS SOLOSTAR) 100 UNIT/ML Solostar Pen Inject 30 Units into the skin every morning. 04/14/19  Yes Renato Shin, MD  magnesium oxide (MAG-OX) 400 MG tablet Take 400 mg by mouth 2 (two) times daily.  12/09/15  Yes [provider]  mycophenolate (MYFORTIC) 180 MG EC tablet Take 360 mg by mouth 2 (two) times daily.   Yes [provider]  pantoprazole (PROTONIX) 40 MG tablet Take 40 mg by mouth daily. 08/02/15  Yes [provider]  predniSONE (DELTASONE) 5 MG tablet Take 5 mg by mouth daily.  01/04/17  Yes [provider]  rosuvastatin (CRESTOR) 5 MG tablet Take 5 mg by mouth at bedtime.   Yes [provider]  Tacrolimus ER (ENVARSUS XR) 1 MG TB24 Take 3 mg by mouth every morning.  Yes [provider]  vitamin E 400 UNIT capsule Take 400 Units by mouth daily.   Yes [provider]    Family History Family History  Problem Relation Age of Onset   Cancer Maternal Grandmother    Diabetes Maternal Grandfather    Diabetes Paternal Grandmother    Diabetes Mother    Kidney disease Mother    Diabetes Father    Heart disease Father    Kidney disease Father    Diabetes Sister    Asthma Sister    Lupus Sister    Diabetes Brother     Social History Social History   Tobacco Use   Smoking status: Never Smoker   Smokeless tobacco: Never Used  Substance Use Topics   Alcohol use: No   Drug use: No     Allergies   Other and Tape   Review of Systems Review of Systems  Constitutional: Positive for appetite change, chills and fever.  HENT: Negative for ear pain and sore throat.   Eyes: Negative for pain and visual disturbance.  Respiratory: Positive for cough. Negative for shortness of breath.   Cardiovascular: Negative for chest pain and palpitations.  Gastrointestinal: Positive for abdominal pain and nausea. Negative for vomiting.   Genitourinary: Positive for difficulty urinating. Negative for dysuria and hematuria.  Musculoskeletal: Positive for myalgias. Negative for arthralgias, back pain and neck pain.  Skin: Negative for color change and rash.  Neurological: Positive for headaches. Negative for seizures and syncope.  Psychiatric/Behavioral: Negative for agitation and behavioral problems.  All other systems reviewed and are negative.    Physical Exam Updated Vital Signs BP (!) 157/67    Pulse 70    Temp 98.3 F (36.8 C) (Oral)    Resp 13    SpO2 96%   Physical Exam Vitals signs and nursing note reviewed.  Constitutional:      General: She is not in acute distress.    Appearance: She is well-developed.  HENT:     Head: Normocephalic and atraumatic.  Eyes:     Conjunctiva/sclera: Conjunctivae normal.  Neck:     Musculoskeletal: Neck supple.  Cardiovascular:     Rate and Rhythm: Normal rate and regular rhythm.     Heart sounds: No murmur.  Pulmonary:     Effort: Pulmonary effort is normal. No respiratory distress.     Breath sounds: Normal breath sounds.  Abdominal:     Palpations: Abdomen is soft.     Tenderness: There is abdominal tenderness (R side, mild, diffuse).  Skin:    General: Skin is warm and dry.  Neurological:     General: No focal deficit present.     Mental Status: She is alert and oriented to person, place, and time. Mental status is at baseline.     Sensory: No sensory deficit.     Motor: No weakness.     Gait: Gait normal.  Psychiatric:        Mood and Affect: Mood normal.        Behavior: Behavior normal.      ED Treatments / Results  Labs (all labs ordered are listed, but only abnormal results are displayed) Labs Reviewed  SARS CORONAVIRUS 2 (TAT 6-24 HRS) - Abnormal; Notable for the following components:      Result Value   SARS Coronavirus 2 POSITIVE (*)    All other components within normal limits  COMPREHENSIVE METABOLIC PANEL - Abnormal; Notable for the  following components:   Sodium 126 (*)  Chloride 93 (*)    Glucose, Bld 541 (*)    BUN 26 (*)    Creatinine, Ser 1.51 (*)    AST 46 (*)    ALT 51 (*)    GFR calc non Af Amer 40 (*)    GFR calc Af Amer 46 (*)    All other components within normal limits  CBC - Abnormal; Notable for the following components:   Platelets 147 (*)    All other components within normal limits  URINALYSIS, ROUTINE W REFLEX MICROSCOPIC - Abnormal; Notable for the following components:   Color, Urine AMBER (*)    APPearance CLOUDY (*)    Glucose, UA >=500 (*)    Ketones, ur 5 (*)    Bacteria, UA RARE (*)    All other components within normal limits  BASIC METABOLIC PANEL - Abnormal; Notable for the following components:   Sodium 131 (*)    CO2 20 (*)    Glucose, Bld 399 (*)    BUN 26 (*)    Creatinine, Ser 1.30 (*)    GFR calc non Af Amer 48 (*)    GFR calc Af Amer 55 (*)    All other components within normal limits  BASIC METABOLIC PANEL - Abnormal; Notable for the following components:   Sodium 133 (*)    CO2 20 (*)    Glucose, Bld 160 (*)    BUN 25 (*)    Creatinine, Ser 1.18 (*)    GFR calc non Af Amer 54 (*)    All other components within normal limits  COOXEMETRY PANEL - Abnormal; Notable for the following components:   Carboxyhemoglobin 1.6 (*)    All other components within normal limits  CBC - Abnormal; Notable for the following components:   WBC 3.9 (*)    Platelets 142 (*)    All other components within normal limits  CBG MONITORING, ED - Abnormal; Notable for the following components:   Glucose-Capillary 504 (*)    All other components within normal limits  CBG MONITORING, ED - Abnormal; Notable for the following components:   Glucose-Capillary 461 (*)    All other components within normal limits  POCT I-STAT EG7 - Abnormal; Notable for the following components:   pCO2, Ven 35.7 (*)    pO2, Ven 145.0 (*)    Acid-base deficit 3.0 (*)    Sodium 126 (*)    Potassium 5.7 (*)      All other components within normal limits  CBG MONITORING, ED - Abnormal; Notable for the following components:   Glucose-Capillary 433 (*)    All other components within normal limits  CBG MONITORING, ED - Abnormal; Notable for the following components:   Glucose-Capillary 400 (*)    All other components within normal limits  CBG MONITORING, ED - Abnormal; Notable for the following components:   Glucose-Capillary 320 (*)    All other components within normal limits  CBG MONITORING, ED - Abnormal; Notable for the following components:   Glucose-Capillary 191 (*)    All other components within normal limits  CBG MONITORING, ED - Abnormal; Notable for the following components:   Glucose-Capillary 163 (*)    All other components within normal limits  CBG MONITORING, ED - Abnormal; Notable for the following components:   Glucose-Capillary 138 (*)    All other components within normal limits  CBG MONITORING, ED - Abnormal; Notable for the following components:   Glucose-Capillary 275 (*)    All other  components within normal limits  URINE CULTURE  LIPASE, BLOOD  BETA-HYDROXYBUTYRIC ACID  HIV ANTIBODY (ROUTINE TESTING W REFLEX)  I-STAT VENOUS BLOOD GAS, ED  I-STAT BETA HCG BLOOD, ED ([facility], [facility], [facility] ONLY)    EKG None  Radiology Ct Abdomen Pelvis Wo Contrast  Result Date: 06/01/2019 CLINICAL DATA:  Nausea and vomiting, COVID-19 positivity EXAM: CT ABDOMEN AND PELVIS WITHOUT CONTRAST TECHNIQUE: Multidetector CT imaging of the abdomen and pelvis was performed following the standard protocol without IV contrast. COMPARISON:  None. FINDINGS: Lower chest: Patchy ground-glass opacities are noted in the bases bilaterally consistent with the given clinical history of COVID-19 positivity. Scattered areas of the reverse halo sign are noted as well. Hepatobiliary: No focal liver abnormality is seen. Status post cholecystectomy. No biliary dilatation. Pancreas: Unremarkable. No pancreatic ductal  dilatation or surrounding inflammatory changes. Spleen: Normal in size without focal abnormality. Adrenals/Urinary Tract: Adrenal glands are within normal limits. The native kidneys are shrunken consistent with the known history of end-stage renal disease. Renal transplant is noted in the right lower quadrant. No obstructive changes are seen. No Perirenal inflammatory changes are seen. The bladder is decompressed. Stomach/Bowel: Scattered diverticular change of the colon is noted. The appendix is within normal limits. No small bowel or gastric abnormality is seen. Vascular/Lymphatic: Aortic atherosclerosis. No enlarged abdominal or pelvic lymph nodes. Reproductive: Status post hysterectomy. No adnexal masses. Other: No abdominal wall hernia or abnormality. No abdominopelvic ascites. Musculoskeletal: No acute or significant osseous findings. IMPRESSION: Changes in the lung bases consistent with the known history of COVID-19 positivity. Changes consistent with end-stage renal disease and right lower quadrant renal transplant. No acute abnormality noted. Diverticulosis without diverticulitis. Electronically Signed   By: Inez Catalina M.D.   On: 06/01/2019 23:13   Dg Chest Portable 1 View  Result Date: 06/01/2019 CLINICAL DATA:  Cough.  COVID 19 positive EXAM: PORTABLE CHEST 1 VIEW COMPARISON:  01/04/2015 FINDINGS: The heart size and mediastinal contours are within normal limits. Both lungs are clear. The visualized skeletal structures are unremarkable. IMPRESSION: Clear lungs. Electronically Signed   By: Ulyses Jarred M.D.   On: 06/01/2019 22:13    Procedures Procedures (including critical care time)  Medications Ordered in ED Medications  dextrose 50 % solution 0-50 mL (has no administration in time range)  enoxaparin (LOVENOX) injection 40 mg (40 mg Subcutaneous Given 06/02/19 1007)  acetaminophen (TYLENOL) tablet 650 mg (650 mg Oral Given 06/02/19 1252)    Or  acetaminophen (TYLENOL) suppository 650 mg  ( Rectal See Alternative 06/02/19 1252)  aspirin EC tablet 81 mg (81 mg Oral Given 06/02/19 1005)  amLODipine (NORVASC) tablet 10 mg (10 mg Oral Given 06/02/19 1006)  carvedilol (COREG) tablet 12.5 mg (12.5 mg Oral Given 06/02/19 1006)  rosuvastatin (CRESTOR) tablet 5 mg (has no administration in time range)  FLUoxetine (PROZAC) capsule 40 mg (40 mg Oral Given 06/02/19 1008)  fludrocortisone (FLORINEF) tablet 0.1 mg (0.1 mg Oral Given 06/02/19 1007)  predniSONE (DELTASONE) tablet 5 mg (has no administration in time range)  docusate sodium (COLACE) capsule 100 mg (100 mg Oral Given 06/02/19 1004)  magnesium oxide (MAG-OX) tablet 400 mg (400 mg Oral Given 06/02/19 1005)  pantoprazole (PROTONIX) EC tablet 40 mg (40 mg Oral Given 06/02/19 1004)  mycophenolate (MYFORTIC) EC tablet 360 mg (360 mg Oral Given 06/02/19 1201)  Tacrolimus ER TB24 3 mg (has no administration in time range)  gabapentin (NEURONTIN) capsule 300 mg (has no administration in time range)  loratadine (CLARITIN) tablet 10  mg (10 mg Oral Given 06/02/19 1005)  insulin aspart (novoLOG) injection 0-9 Units (2 Units Subcutaneous Given 06/02/19 1009)  insulin aspart (novoLOG) injection 0-5 Units (has no administration in time range)  0.9 %  sodium chloride infusion ( Intravenous New Bag/Given 06/02/19 1003)  remdesivir 200 mg in sodium chloride 0.9% 250 mL IVPB (0 mg Intravenous Stopped 06/02/19 1240)    Followed by  remdesivir 100 mg in sodium chloride 0.9 % 100 mL IVPB (has no administration in time range)  insulin glargine (LANTUS) injection 20 Units (has no administration in time range)  sodium chloride 0.9 % bolus 500 mL (0 mLs Intravenous Stopped 06/01/19 2110)  sodium chloride 0.9 % bolus 500 mL (0 mLs Intravenous Stopped 06/02/19 0113)  acetaminophen (TYLENOL) tablet 650 mg (650 mg Oral Given 06/01/19 2202)  insulin glargine (LANTUS) injection 10 Units (10 Units Subcutaneous Given 06/02/19 1003)     Initial Impression / Assessment and Plan  / ED Course  I have reviewed the triage vital signs and the nursing notes.  Pertinent labs & imaging results that were available during my care of the patient were reviewed by me and considered in my medical decision making (see chart for details).        On arrival, afebrile, HDS, well appearing overall.   Concern for diffuse body aches and inability to tolerate PO in setting of Covid-19 infection.  UA without clear evidence of UTI.  CMP: Hyponatremia, corrected 133 Glucose 541 Bicarb wnl AG corrected to 18 LFTs mildly elevated, non specific   Abdomen is mildly tender diffusely on the R.  CT abdomen pelvis Noncon obtained showing no acute abnormalities noted.  Patient given IV fluids.  Given her hyperglycemia and anion gap, patient was initiated on insulin drip. Given patient is immunocompromised in setting of COVID-19 illness as well as inability to tolerate p.o. including her immunosuppressive therapy and has not been taking her insulin, hospitalist contacted for admission.  No further acute events.  Patient agreeable with admission.  They have assumed care of patient at this time.  Final Clinical Impressions(s) / ED Diagnoses   Final diagnoses:  Dysuria  COVID-19  Nausea  Hyperglycemia    ED Discharge Orders    None       Burns Spain, MD 06/02/19 1505    Carmin Muskrat, MD 06/03/19 1747

## 2019-06-02 ENCOUNTER — Encounter (HOSPITAL_COMMUNITY): Payer: Self-pay | Admitting: Internal Medicine

## 2019-06-02 ENCOUNTER — Other Ambulatory Visit: Payer: Self-pay

## 2019-06-02 DIAGNOSIS — Z825 Family history of asthma and other chronic lower respiratory diseases: Secondary | ICD-10-CM | POA: Diagnosis not present

## 2019-06-02 DIAGNOSIS — E1110 Type 2 diabetes mellitus with ketoacidosis without coma: Secondary | ICD-10-CM | POA: Diagnosis present

## 2019-06-02 DIAGNOSIS — N179 Acute kidney failure, unspecified: Secondary | ICD-10-CM | POA: Diagnosis present

## 2019-06-02 DIAGNOSIS — K5790 Diverticulosis of intestine, part unspecified, without perforation or abscess without bleeding: Secondary | ICD-10-CM | POA: Diagnosis present

## 2019-06-02 DIAGNOSIS — J1289 Other viral pneumonia: Secondary | ICD-10-CM | POA: Diagnosis present

## 2019-06-02 DIAGNOSIS — E1165 Type 2 diabetes mellitus with hyperglycemia: Secondary | ICD-10-CM | POA: Diagnosis not present

## 2019-06-02 DIAGNOSIS — Z841 Family history of disorders of kidney and ureter: Secondary | ICD-10-CM | POA: Diagnosis not present

## 2019-06-02 DIAGNOSIS — R7401 Elevation of levels of liver transaminase levels: Secondary | ICD-10-CM | POA: Diagnosis present

## 2019-06-02 DIAGNOSIS — E669 Obesity, unspecified: Secondary | ICD-10-CM | POA: Diagnosis present

## 2019-06-02 DIAGNOSIS — Y830 Surgical operation with transplant of whole organ as the cause of abnormal reaction of the patient, or of later complication, without mention of misadventure at the time of the procedure: Secondary | ICD-10-CM | POA: Diagnosis present

## 2019-06-02 DIAGNOSIS — Z940 Kidney transplant status: Secondary | ICD-10-CM | POA: Diagnosis not present

## 2019-06-02 DIAGNOSIS — T8619 Other complication of kidney transplant: Secondary | ICD-10-CM | POA: Diagnosis present

## 2019-06-02 DIAGNOSIS — U071 COVID-19: Secondary | ICD-10-CM | POA: Diagnosis present

## 2019-06-02 DIAGNOSIS — I10 Essential (primary) hypertension: Secondary | ICD-10-CM | POA: Diagnosis present

## 2019-06-02 DIAGNOSIS — E875 Hyperkalemia: Secondary | ICD-10-CM | POA: Diagnosis present

## 2019-06-02 DIAGNOSIS — R739 Hyperglycemia, unspecified: Secondary | ICD-10-CM | POA: Diagnosis present

## 2019-06-02 DIAGNOSIS — Z7952 Long term (current) use of systemic steroids: Secondary | ICD-10-CM | POA: Diagnosis not present

## 2019-06-02 DIAGNOSIS — Z91128 Patient's intentional underdosing of medication regimen for other reason: Secondary | ICD-10-CM | POA: Diagnosis not present

## 2019-06-02 DIAGNOSIS — T383X6A Underdosing of insulin and oral hypoglycemic [antidiabetic] drugs, initial encounter: Secondary | ICD-10-CM | POA: Diagnosis present

## 2019-06-02 DIAGNOSIS — Z6832 Body mass index (BMI) 32.0-32.9, adult: Secondary | ICD-10-CM | POA: Diagnosis not present

## 2019-06-02 DIAGNOSIS — E1140 Type 2 diabetes mellitus with diabetic neuropathy, unspecified: Secondary | ICD-10-CM | POA: Diagnosis present

## 2019-06-02 DIAGNOSIS — Z794 Long term (current) use of insulin: Secondary | ICD-10-CM | POA: Diagnosis not present

## 2019-06-02 DIAGNOSIS — Z832 Family history of diseases of the blood and blood-forming organs and certain disorders involving the immune mechanism: Secondary | ICD-10-CM | POA: Diagnosis not present

## 2019-06-02 DIAGNOSIS — E11649 Type 2 diabetes mellitus with hypoglycemia without coma: Secondary | ICD-10-CM | POA: Diagnosis not present

## 2019-06-02 DIAGNOSIS — E785 Hyperlipidemia, unspecified: Secondary | ICD-10-CM | POA: Diagnosis present

## 2019-06-02 DIAGNOSIS — E871 Hypo-osmolality and hyponatremia: Secondary | ICD-10-CM | POA: Diagnosis present

## 2019-06-02 DIAGNOSIS — Z7982 Long term (current) use of aspirin: Secondary | ICD-10-CM | POA: Diagnosis not present

## 2019-06-02 DIAGNOSIS — R300 Dysuria: Secondary | ICD-10-CM | POA: Diagnosis present

## 2019-06-02 DIAGNOSIS — Z833 Family history of diabetes mellitus: Secondary | ICD-10-CM | POA: Diagnosis not present

## 2019-06-02 LAB — BETA-HYDROXYBUTYRIC ACID: Beta-Hydroxybutyric Acid: 0.19 mmol/L (ref 0.05–0.27)

## 2019-06-02 LAB — COOXEMETRY PANEL
Carboxyhemoglobin: 1.6 % — ABNORMAL HIGH (ref 0.5–1.5)
Methemoglobin: 1.1 % (ref 0.0–1.5)
O2 Saturation: 89.6 %
Total hemoglobin: 14.2 g/dL (ref 12.0–16.0)

## 2019-06-02 LAB — CBG MONITORING, ED
Glucose-Capillary: 433 mg/dL — ABNORMAL HIGH (ref 70–99)
Glucose-Capillary: 400 mg/dL — ABNORMAL HIGH (ref 70–99)
Glucose-Capillary: 320 mg/dL — ABNORMAL HIGH (ref 70–99)
Glucose-Capillary: 191 mg/dL — ABNORMAL HIGH (ref 70–99)
Glucose-Capillary: 138 mg/dL — ABNORMAL HIGH (ref 70–99)
Glucose-Capillary: 163 mg/dL — ABNORMAL HIGH (ref 70–99)
Glucose-Capillary: 275 mg/dL — ABNORMAL HIGH (ref 70–99)
Glucose-Capillary: 268 mg/dL — ABNORMAL HIGH (ref 70–99)
Glucose-Capillary: 297 mg/dL — ABNORMAL HIGH (ref 70–99)

## 2019-06-02 LAB — CBC
HCT: 40.7 % (ref 36.0–46.0)
Hemoglobin: 13.9 g/dL (ref 12.0–15.0)
MCH: 30.5 pg (ref 26.0–34.0)
MCHC: 34.2 g/dL (ref 30.0–36.0)
MCV: 89.3 fL (ref 80.0–100.0)
Platelets: 142 10*3/uL — ABNORMAL LOW (ref 150–400)
RBC: 4.56 MIL/uL (ref 3.87–5.11)
RDW: 11.8 % (ref 11.5–15.5)
WBC: 3.9 10*3/uL — ABNORMAL LOW (ref 4.0–10.5)
nRBC: 0 % (ref 0.0–0.2)

## 2019-06-02 LAB — BASIC METABOLIC PANEL
Anion gap: 10 (ref 5–15)
Anion gap: 10 (ref 5–15)
BUN: 26 mg/dL — ABNORMAL HIGH (ref 6–20)
BUN: 25 mg/dL — ABNORMAL HIGH (ref 6–20)
CO2: 20 mmol/L — ABNORMAL LOW (ref 22–32)
CO2: 20 mmol/L — ABNORMAL LOW (ref 22–32)
Calcium: 9.4 mg/dL (ref 8.9–10.3)
Calcium: 9.5 mg/dL (ref 8.9–10.3)
Chloride: 101 mmol/L (ref 98–111)
Chloride: 103 mmol/L (ref 98–111)
Creatinine, Ser: 1.3 mg/dL — ABNORMAL HIGH (ref 0.44–1.00)
Creatinine, Ser: 1.18 mg/dL — ABNORMAL HIGH (ref 0.44–1.00)
GFR calc Af Amer: 55 mL/min — ABNORMAL LOW (ref >60)
GFR calc Af Amer: >60 mL/min (ref >60)
GFR calc non Af Amer: 48 mL/min — ABNORMAL LOW (ref >60)
GFR calc non Af Amer: 54 mL/min — ABNORMAL LOW (ref >60)
Glucose, Bld: 399 mg/dL — ABNORMAL HIGH (ref 70–99)
Glucose, Bld: 160 mg/dL — ABNORMAL HIGH (ref 70–99)
Potassium: 5.1 mmol/L (ref 3.5–5.1)
Potassium: 4.4 mmol/L (ref 3.5–5.1)
Sodium: 131 mmol/L — ABNORMAL LOW (ref 135–145)
Sodium: 133 mmol/L — ABNORMAL LOW (ref 135–145)

## 2019-06-02 LAB — LACTATE DEHYDROGENASE: LDH: 150 U/L (ref 98–192)

## 2019-06-02 LAB — SARS CORONAVIRUS 2 (TAT 6-24 HRS): SARS Coronavirus 2: POSITIVE — AB

## 2019-06-02 LAB — HIV ANTIBODY (ROUTINE TESTING W REFLEX): HIV Screen 4th Generation wRfx: NONREACTIVE

## 2019-06-02 LAB — D-DIMER, QUANTITATIVE: D-Dimer, Quant: 0.5 ug/mL-FEU (ref 0.00–0.50)

## 2019-06-02 MED ORDER — GABAPENTIN 300 MG PO CAPS
300.0000 mg | ORAL_CAPSULE | Freq: Every day | ORAL | Status: DC
  Administered 2019-06-02 – 2019-06-05 (×4): 300 mg via ORAL
  Filled 2019-06-02 (×4): qty 1

## 2019-06-02 MED ORDER — INSULIN GLARGINE 100 UNIT/ML ~~LOC~~ SOLN
10.0000 [IU] | Freq: Once | SUBCUTANEOUS | Status: AC
  Administered 2019-06-02: 10 [IU] via SUBCUTANEOUS
  Filled 2019-06-02: qty 0.1

## 2019-06-02 MED ORDER — TACROLIMUS ER 1 MG PO TB24
3.0000 mg | ORAL_TABLET | ORAL | Status: DC
  Administered 2019-06-02: 3 mg via ORAL

## 2019-06-02 MED ORDER — MYCOPHENOLATE SODIUM 180 MG PO TBEC
360.0000 mg | DELAYED_RELEASE_TABLET | Freq: Two times a day (BID) | ORAL | Status: DC
  Administered 2019-06-02 – 2019-06-06 (×9): 360 mg via ORAL
  Filled 2019-06-02 (×12): qty 2

## 2019-06-02 MED ORDER — ASPIRIN EC 81 MG PO TBEC
81.0000 mg | DELAYED_RELEASE_TABLET | Freq: Every day | ORAL | Status: DC
  Administered 2019-06-02 – 2019-06-06 (×5): 81 mg via ORAL
  Filled 2019-06-02 (×5): qty 1

## 2019-06-02 MED ORDER — FLUDROCORTISONE ACETATE 0.1 MG PO TABS
0.1000 mg | ORAL_TABLET | Freq: Every day | ORAL | Status: DC
  Administered 2019-06-02 – 2019-06-06 (×5): 0.1 mg via ORAL
  Filled 2019-06-02 (×5): qty 1

## 2019-06-02 MED ORDER — SODIUM CHLORIDE 0.9 % IV SOLN
INTRAVENOUS | Status: AC
  Administered 2019-06-02: 10:00:00 via INTRAVENOUS

## 2019-06-02 MED ORDER — INSULIN GLARGINE 100 UNIT/ML ~~LOC~~ SOLN: 20.0000 [IU] | Freq: Every day | SUBCUTANEOUS | Status: DC

## 2019-06-02 MED ORDER — SODIUM CHLORIDE 0.9 % IV SOLN
200.0000 mg | Freq: Once | INTRAVENOUS | Status: AC
  Administered 2019-06-02: 200 mg via INTRAVENOUS
  Filled 2019-06-02: qty 200

## 2019-06-02 MED ORDER — ACETAMINOPHEN 650 MG RE SUPP: 650.0000 mg | Freq: Four times a day (QID) | RECTAL | Status: DC | PRN

## 2019-06-02 MED ORDER — INSULIN ASPART 100 UNIT/ML ~~LOC~~ SOLN
3.0000 [IU] | Freq: Three times a day (TID) | SUBCUTANEOUS | Status: DC
  Administered 2019-06-02 – 2019-06-05 (×8): 3 [IU] via SUBCUTANEOUS

## 2019-06-02 MED ORDER — INSULIN ASPART 100 UNIT/ML ~~LOC~~ SOLN
0.0000 [IU] | Freq: Every day | SUBCUTANEOUS | Status: DC
  Administered 2019-06-02 – 2019-06-03 (×2): 3 [IU] via SUBCUTANEOUS

## 2019-06-02 MED ORDER — ROSUVASTATIN CALCIUM 5 MG PO TABS
5.0000 mg | ORAL_TABLET | Freq: Every day | ORAL | Status: DC
  Administered 2019-06-02 – 2019-06-05 (×4): 5 mg via ORAL
  Filled 2019-06-02 (×4): qty 1

## 2019-06-02 MED ORDER — SODIUM CHLORIDE 0.9 % IV SOLN
100.0000 mg | Freq: Every day | INTRAVENOUS | Status: AC
  Administered 2019-06-03 – 2019-06-06 (×4): 100 mg via INTRAVENOUS
  Filled 2019-06-02 (×5): qty 20

## 2019-06-02 MED ORDER — FLUOXETINE HCL 20 MG PO CAPS
40.0000 mg | ORAL_CAPSULE | Freq: Every day | ORAL | Status: DC
  Administered 2019-06-02 – 2019-06-06 (×5): 40 mg via ORAL
  Filled 2019-06-02 (×5): qty 2

## 2019-06-02 MED ORDER — INSULIN GLARGINE 100 UNIT/ML ~~LOC~~ SOLN
25.0000 [IU] | Freq: Every day | SUBCUTANEOUS | Status: DC
  Administered 2019-06-03: 25 [IU] via SUBCUTANEOUS
  Filled 2019-06-02: qty 0.25

## 2019-06-02 MED ORDER — PANTOPRAZOLE SODIUM 40 MG PO TBEC
40.0000 mg | DELAYED_RELEASE_TABLET | Freq: Every day | ORAL | Status: DC
  Administered 2019-06-02 – 2019-06-06 (×5): 40 mg via ORAL
  Filled 2019-06-02 (×5): qty 1

## 2019-06-02 MED ORDER — AMLODIPINE BESYLATE 10 MG PO TABS
10.0000 mg | ORAL_TABLET | Freq: Every day | ORAL | Status: DC
  Administered 2019-06-02 – 2019-06-06 (×5): 10 mg via ORAL
  Filled 2019-06-02: qty 1
  Filled 2019-06-02: qty 2
  Filled 2019-06-02 (×3): qty 1

## 2019-06-02 MED ORDER — INSULIN ASPART 100 UNIT/ML ~~LOC~~ SOLN
0.0000 [IU] | Freq: Three times a day (TID) | SUBCUTANEOUS | Status: DC
  Administered 2019-06-02 (×2): 5 [IU] via SUBCUTANEOUS
  Administered 2019-06-02: 2 [IU] via SUBCUTANEOUS
  Administered 2019-06-03: 3 [IU] via SUBCUTANEOUS
  Administered 2019-06-03: 7 [IU] via SUBCUTANEOUS
  Administered 2019-06-03: 3 [IU] via SUBCUTANEOUS
  Administered 2019-06-04 (×2): 2 [IU] via SUBCUTANEOUS
  Administered 2019-06-04: 1 [IU] via SUBCUTANEOUS
  Administered 2019-06-05: 5 [IU] via SUBCUTANEOUS
  Administered 2019-06-05: 3 [IU] via SUBCUTANEOUS
  Administered 2019-06-06: 2 [IU] via SUBCUTANEOUS
  Administered 2019-06-06: 5 [IU] via SUBCUTANEOUS

## 2019-06-02 MED ORDER — CARVEDILOL 12.5 MG PO TABS
12.5000 mg | ORAL_TABLET | Freq: Two times a day (BID) | ORAL | Status: DC
  Administered 2019-06-02 – 2019-06-06 (×9): 12.5 mg via ORAL
  Filled 2019-06-02 (×9): qty 1

## 2019-06-02 MED ORDER — MAGNESIUM OXIDE 400 (241.3 MG) MG PO TABS
400.0000 mg | ORAL_TABLET | Freq: Two times a day (BID) | ORAL | Status: DC
  Administered 2019-06-02 – 2019-06-06 (×9): 400 mg via ORAL
  Filled 2019-06-02 (×9): qty 1

## 2019-06-02 MED ORDER — SODIUM CHLORIDE 0.9 % IV SOLN
INTRAVENOUS | Status: DC
  Administered 2019-06-02 – 2019-06-03 (×2): via INTRAVENOUS

## 2019-06-02 MED ORDER — ACETAMINOPHEN 325 MG PO TABS
650.0000 mg | ORAL_TABLET | Freq: Four times a day (QID) | ORAL | Status: DC | PRN
  Administered 2019-06-02 – 2019-06-05 (×5): 650 mg via ORAL
  Filled 2019-06-02 (×5): qty 2

## 2019-06-02 MED ORDER — PREDNISONE 5 MG PO TABS
5.0000 mg | ORAL_TABLET | Freq: Every day | ORAL | Status: DC
  Administered 2019-06-02 – 2019-06-06 (×5): 5 mg via ORAL
  Filled 2019-06-02 (×8): qty 1

## 2019-06-02 MED ORDER — LORATADINE 10 MG PO TABS
10.0000 mg | ORAL_TABLET | Freq: Every day | ORAL | Status: DC
  Administered 2019-06-02 – 2019-06-06 (×5): 10 mg via ORAL
  Filled 2019-06-02 (×5): qty 1

## 2019-06-02 MED ORDER — ENOXAPARIN SODIUM 40 MG/0.4ML ~~LOC~~ SOLN
40.0000 mg | Freq: Every day | SUBCUTANEOUS | Status: DC
  Administered 2019-06-02 – 2019-06-06 (×5): 40 mg via SUBCUTANEOUS
  Filled 2019-06-02 (×5): qty 0.4

## 2019-06-02 MED ORDER — DOCUSATE SODIUM 100 MG PO CAPS
100.0000 mg | ORAL_CAPSULE | Freq: Two times a day (BID) | ORAL | Status: DC
  Administered 2019-06-02 – 2019-06-06 (×10): 100 mg via ORAL
  Filled 2019-06-02 (×10): qty 1

## 2019-06-02 MED ORDER — FLUTICASONE PROPIONATE 50 MCG/ACT NA SUSP
1.0000 | Freq: Every day | NASAL | Status: DC
  Filled 2019-06-02 (×2): qty 16

## 2019-06-02 MED ORDER — HYDRALAZINE HCL 10 MG PO TABS
10.0000 mg | ORAL_TABLET | Freq: Three times a day (TID) | ORAL | Status: DC | PRN
  Filled 2019-06-02: qty 1

## 2019-06-02 MED ORDER — INSULIN GLARGINE 100 UNIT/ML ~~LOC~~ SOLN
15.0000 [IU] | Freq: Once | SUBCUTANEOUS | Status: AC
  Administered 2019-06-02: 15 [IU] via SUBCUTANEOUS
  Filled 2019-06-02: qty 0.15

## 2019-06-02 NOTE — Progress Notes (Signed)
Pharmacy Consult - Remdesivir  58 yof presenting COVID-19 positive with respiratory symptoms requiring hospitalization. Pharmacy consulted to dose Remdesivir. ALT<220.   Plan: Remdesivir 200mg  IV x 1; then 100mg  IV q24h to complete 5 total doses Monitor clinical progress, ALT   Elicia Lamp, PharmD, BCPS Please check AMION for all [place] contact numbers Clinical Pharmacist 05/17/2019 7:38 PM

## 2019-06-02 NOTE — ED Notes (Signed)
Notified pt to have family bring tacro meds

## 2019-06-02 NOTE — ED Notes (Signed)
Pt is NSR on monitor 

## 2019-06-02 NOTE — ED Notes (Signed)
Called ptar at [phone] Cropp

## 2019-06-02 NOTE — ED Notes (Signed)
ED TO INPATIENT HANDOFF REPORT  ED Nurse Name and Phone #: Lorrin Goodell [phone]  S Name/Age/Gender Cynthia [name] 48 y.o. female Room/Bed: 020C/020C  Code Status   Code Status: Full Code  Home/SNF/Other Home Patient oriented to: self, place, time and situation Is this baseline? Yes   Triage Complete: Triage complete  Chief Complaint Post opp, general weakness  Triage Note Pt states was tested + for covid on Monday reports body aches and fatigue also has burning with urination now. Daughter is also concerned because she has missed a few of her anti refection pills she takes from her kidney transplant. Denies any CP or SOB.     Allergies Allergies  Allergen Reactions  . Other Rash    Burns skin.Please use paper tape only Burns skin.Please use paper tape only  . Tape Other (See Comments)    Burns skin.  Please use paper tape only    Level of Care/Admitting Diagnosis ED Disposition    ED Disposition Condition [facility] Area: [place] [[id number]]  Level of Care: Telemetry [5]  Covid Evaluation: Confirmed COVID Positive  Diagnosis: DKA (diabetic ketoacidoses) [facility]) [[id number]]  Admitting Physician: Jani Gravel [[id number]]  Attending Physician: Elmarie Shiley [phone]  Estimated length of stay: 3 - 4 days  Certification:: I certify this patient will need inpatient services for at least 2 midnights  PT Class (Do Not Modify): Inpatient [101]  PT Acc Code (Do Not Modify): Private [1]       B Medical/Surgery History Past Medical History:  Diagnosis Date  . Anemia    when on dialysis  . Anxiety   . Depression   . ESRD on hemodialysis ([place])    Home HD 5x per week- not on dialysis now had tramsplant 4/17  . GERD (gastroesophageal reflux disease)   . History of blood transfusion    transfusion reaction  . Hyperlipidemia   . Hypertension   . 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 Kidney transplant recipient   . Neuromuscular disorder ([place])    NEUROPATHY   Past Surgical History:  Procedure Laterality Date  . ABDOMINAL HYSTERECTOMY    . BREAST EXCISIONAL BIOPSY Right 08/2015  . BREAST LUMPECTOMY WITH RADIOACTIVE SEED LOCALIZATION Right 09/14/2015   Procedure: RIGHT BREAST LUMPECTOMY WITH RADIOACTIVE SEED LOCALIZATION;  Surgeon: Donnie Mesa, MD;  Location: [place];  Service: General;  Laterality: Right;  . BREAST SURGERY Bilateral    biopsy bilateral  . CATARACT EXTRACTION Bilateral    bilateral  . CHOLECYSTECTOMY    . CYST REMOVAL NECK    . DIALYSIS FISTULA CREATION Left   . EYE SURGERY Bilateral    [facility]  . LEFT HEART CATH AND CORONARY ANGIOGRAPHY N/A 03/04/2019   Procedure: LEFT HEART CATH AND CORONARY ANGIOGRAPHY;  Surgeon: Martinique, Peter M, MD;  Location: [place] CV LAB;  Service: Cardiovascular;  Laterality: N/A;  . LEFT HEART CATHETERIZATION WITH CORONARY ANGIOGRAM N/A 03/30/2014   Procedure: LEFT HEART CATHETERIZATION WITH CORONARY ANGIOGRAM;  Surgeon: Sinclair Grooms, MD;  Location: [facility] CATH LAB;  Service: Cardiovascular;  Laterality: N/A;  . LIPOMA EXCISION N/A 02/06/2017   Procedure: EXCISION POSTERIOR NECK SEBACEOUS CYST;  Surgeon: Coralie Keens, MD;  Location: [place];  Service: General;  Laterality: N/A;  . 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 REVISON OF ARTERIOVENOUS FISTULA Left 09/28/2013   Procedure: EXCISE ESCHAR LEFT ARM  ARTERIOVENOUS FISTULA WITH PLICATION OF LEFT ARM ARTERIOVENOUS FISTULA;  Surgeon: Elam Dutch, MD;  Location: [place];  Service: Vascular;  Laterality: Left;  . REVISON OF ARTERIOVENOUS FISTULA Left 08/26/2015   Procedure: RESECTION ANEURYSM OF LEFT ARM ARTERIOVENOUS FISTULA  ;  Surgeon: Serafina Mitchell, MD;  Location:  [place];  Service: Vascular;  Laterality: Left;  . TUBAL LIGATION       A IV Location/Drains/Wounds Patient Lines/Drains/Airways Status   Active Line/Drains/Airways    Name:   Placement date:   Placement time:   Site:   Days:   Peripheral IV 06/01/19 Right Antecubital   06/01/19    2022    Antecubital   1   Fistula / Graft Left Upper arm Arteriovenous fistula   07/07/15    -    Upper arm   1426          Intake/Output Last 24 hours  Intake/Output Summary (Last 24 hours) at 06/02/2019 1641 Last data filed at 06/02/2019 1240 Gross per 24 hour  Intake 250 ml  Output 500 ml  Net -250 ml    Labs/Imaging Results for orders placed or performed during the hospital encounter of 06/01/19 (from the past 48 hour(s))  Lipase, blood     Status: None   Collection Time: 06/01/19  6:16 PM  Result Value Ref Range   Lipase 17 11 - 51 U/L    Comment: Performed at [facility], [address], [place], [place] [address]  Comprehensive metabolic panel     Status: Abnormal   Collection Time: 06/01/19  6:16 PM  Result Value Ref Range   Sodium 126 (L) 135 - 145 mmol/L   Potassium 4.9 3.5 - 5.1 mmol/L   Chloride 93 (L) 98 - 111 mmol/L   CO2 22 22 - 32 mmol/L   Glucose, Bld 541 (HH) 70 - 99 mg/dL    Comment: CRITICAL RESULT CALLED TO, READ BACK BY AND VERIFIED WITH: K FIELDS,RN 1907 06/01/2019 WBOND    BUN 26 (H) 6 - 20 mg/dL   Creatinine, Ser 1.51 (H) 0.44 - 1.00 mg/dL   Calcium 10.0 8.9 - 10.3 mg/dL   Total Protein 6.7 6.5 - 8.1 g/dL   Albumin 3.6 3.5 - 5.0 g/dL   AST 46 (H) 15 - 41 U/L   ALT 51 (H) 0 - 44 U/L   Alkaline Phosphatase 84 38 - 126 U/L   Total Bilirubin 0.9 0.3 - 1.2 mg/dL   GFR calc non Af Amer 40 (L) >60 mL/min   GFR calc Af Amer 46 (L) >60 mL/min   Anion gap 11 5 - 15    Comment: Performed at [facility] [address] [place], [place] [address]  CBC     Status: Abnormal   Collection Time: 06/01/19  6:16 PM  Result Value Ref Range   WBC 5.7 4.0 - 10.5 K/uL    RBC 4.66 3.87 - 5.11 MIL/uL   Hemoglobin 14.5 12.0 - 15.0 g/dL   HCT 41.8 36.0 - 46.0 %   MCV 89.7 80.0 - 100.0 fL   MCH 31.1 26.0 - 34.0 pg   MCHC 34.7 30.0 - 36.0 g/dL   RDW 11.9 11.5 - 15.5 %   Platelets 147 (L) 150 - 400 K/uL   nRBC  0.0 0.0 - 0.2 %    Comment: Performed at [facility], [place] [address], [place], [place] [address]  Urinalysis, Routine w reflex microscopic     Status: Abnormal   Collection Time: 06/01/19  8:30 PM  Result Value Ref Range   Color, Urine AMBER (A) YELLOW    Comment: BIOCHEMICALS MAY BE AFFECTED BY COLOR   APPearance CLOUDY (A) CLEAR   Specific Gravity, Urine 1.029 1.005 - 1.030   pH 5.0 5.0 - 8.0   Glucose, UA >=500 (A) NEGATIVE mg/dL   Hgb urine dipstick NEGATIVE NEGATIVE   Bilirubin Urine NEGATIVE NEGATIVE   Ketones, ur 5 (A) NEGATIVE mg/dL   Protein, ur NEGATIVE NEGATIVE mg/dL   Nitrite NEGATIVE NEGATIVE   Leukocytes,Ua NEGATIVE NEGATIVE   RBC / HPF 0-5 0 - 5 RBC/hpf   WBC, UA 0-5 0 - 5 WBC/hpf   Bacteria, UA RARE (A) NONE SEEN   Squamous Epithelial / LPF 0-5 0 - 5   Mucus PRESENT    Hyaline Casts, UA PRESENT     Comment: Performed at [facility], [address] [place], [place] [address]  CBG monitoring, ED     Status: Abnormal   Collection Time: 06/01/19  9:13 PM  Result Value Ref Range   Glucose-Capillary 504 (HH) 70 - 99 mg/dL   Comment 1 Document in Chart   I-Stat beta hCG blood, ED     Status: None   Collection Time: 06/01/19 10:09 PM  Result Value Ref Range   I-stat hCG, quantitative <5.0 <5 mIU/mL   Comment 3            Comment:   GEST. AGE      CONC.  (mIU/mL)   <=1 WEEK        5 - 50     2 WEEKS       50 - 500     3 WEEKS       100 - 10,000     4 WEEKS     1,000 - 30,000        FEMALE AND NON-PREGNANT FEMALE:     LESS THAN 5 mIU/mL   POCT I-Stat EG7     Status: Abnormal   Collection Time: 06/01/19 10:10 PM  Result Value Ref Range   pH, Ven 7.382 7.250 - 7.430   pCO2, Ven 35.7 (L) 44.0 - 60.0 mmHg    pO2, Ven 145.0 (H) 32.0 - 45.0 mmHg   Bicarbonate 21.2 20.0 - 28.0 mmol/L   TCO2 22 22 - 32 mmol/L   O2 Saturation 99.0 %   Acid-base deficit 3.0 (H) 0.0 - 2.0 mmol/L   Sodium 126 (L) 135 - 145 mmol/L   Potassium 5.7 (H) 3.5 - 5.1 mmol/L   Calcium, Ion 1.30 1.15 - 1.40 mmol/L   HCT 43.0 36.0 - 46.0 %   Hemoglobin 14.6 12.0 - 15.0 g/dL   Patient temperature HIDE    Sample type VENOUS   CBG monitoring, ED     Status: Abnormal   Collection Time: 06/01/19 11:57 PM  Result Value Ref Range   Glucose-Capillary 461 (H) 70 - 99 mg/dL  SARS CORONAVIRUS 2 (TAT 6-24 HRS) Nasopharyngeal Nasopharyngeal Swab     Status: Abnormal   Collection Time: 06/02/19 12:18 AM   Specimen: Nasopharyngeal Swab  Result Value Ref Range   SARS Coronavirus 2 POSITIVE (A) NEGATIVE    Comment: RESULT CALLED TO, READ BACK BY AND VERIFIED WITH: Octivia ROBERTSON  RN._0  ON 12.8.2020 BY TCALDWELL MT. (NOTE) SARS-CoV-2 target nucleic acids are DETECTED. The SARS-CoV-2 RNA is generally detectable in upper and lower respiratory specimens during the acute phase of infection. Positive results are indicative of the presence of SARS-CoV-2 RNA. Clinical correlation with patient history and other diagnostic information is  necessary to determine patient infection status. Positive results do not rule out bacterial infection or co-infection with other viruses.  The expected result is Negative. Fact Sheet for Patients: SugarRoll.be Fact Sheet for Healthcare Providers: https://www.woods-mathews.com/ This test is not yet approved or cleared by the [place] FDA and  has been authorized for detection and/or diagnosis of SARS-CoV-2 by FDA under an Emergency Use Authorization (EUA). This EUA will remain  in effect (meaning this te st can be used) for the duration of the COVID-19 declaration under Section 564(b)(1) of the Act, 21 U.S.C. section 360bbb-3(b)(1), unless the  authorization is terminated or revoked sooner. Performed at [facility], [place] [address], [place], [place] [address]   CBG monitoring, ED     Status: Abnormal   Collection Time: 06/02/19  1:04 AM  Result Value Ref Range   Glucose-Capillary 433 (H) 70 - 99 mg/dL  CBG monitoring, ED     Status: Abnormal   Collection Time: 06/02/19  2:18 AM  Result Value Ref Range   Glucose-Capillary 400 (H) 70 - 99 mg/dL  Basic metabolic panel     Status: Abnormal   Collection Time: 06/02/19  2:30 AM  Result Value Ref Range   Sodium 131 (L) 135 - 145 mmol/L   Potassium 5.1 3.5 - 5.1 mmol/L   Chloride 101 98 - 111 mmol/L   CO2 20 (L) 22 - 32 mmol/L   Glucose, Bld 399 (H) 70 - 99 mg/dL   BUN 26 (H) 6 - 20 mg/dL   Creatinine, Ser 1.30 (H) 0.44 - 1.00 mg/dL   Calcium 9.4 8.9 - 10.3 mg/dL   GFR calc non Af Amer 48 (L) >60 mL/min   GFR calc Af Amer 55 (L) >60 mL/min   Anion gap 10 5 - 15    Comment: Performed at [facility] [address], [place],  [address]  .Cooxemetry Panel (carboxy, met, total hgb, O2 sat)     Status: Abnormal   Collection Time: 06/02/19  2:30 AM  Result Value Ref Range   Total hemoglobin 14.2 12.0 - 16.0 g/dL   O2 Saturation 89.6 %   Carboxyhemoglobin 1.6 (H) 0.5 - 1.5 %   Methemoglobin 1.1 0.0 - 1.5 %    Comment: Performed at [facility] [address] [place],  [address]  CBG monitoring, ED     Status: Abnormal   Collection Time: 06/02/19  3:17 AM  Result Value Ref Range   Glucose-Capillary 320 (H) 70 - 99 mg/dL  CBG monitoring, ED     Status: Abnormal   Collection Time: 06/02/19  4:39 AM  Result Value Ref Range   Glucose-Capillary 191 (H) 70 - 99 mg/dL  Basic metabolic panel     Status: Abnormal   Collection Time: 06/02/19  5:23 AM  Result Value Ref Range   Sodium 133 (L) 135 - 145 mmol/L   Potassium 4.4 3.5 - 5.1 mmol/L   Chloride 103 98 - 111 mmol/L   CO2 20 (L) 22 - 32 mmol/L   Glucose, Bld 160 (H) 70 - 99 mg/dL   BUN 25 (H)  6 - 20 mg/dL   Creatinine, Ser  1.18 (H) 0.44 - 1.00 mg/dL   Calcium 9.5 8.9 - 10.3 mg/dL   GFR calc non Af Amer 54 (L) >60 mL/min   GFR calc Af Amer >60 >60 mL/min   Anion gap 10 5 - 15    Comment: Performed at [facility] [address] [place], [place] [address]  Beta-hydroxybutyric acid     Status: None   Collection Time: 06/02/19  5:23 AM  Result Value Ref Range   Beta-Hydroxybutyric Acid 0.19 0.05 - 0.27 mmol/L    Comment: Performed at [facility] [address] [place], [place] [address]  HIV Antibody (routine testing w rflx)     Status: None   Collection Time: 06/02/19  5:23 AM  Result Value Ref Range   HIV Screen 4th Generation wRfx NON REACTIVE NON REACTIVE    Comment: Performed at [facility] [address] [place], [place] [address]  CBC     Status: Abnormal   Collection Time: 06/02/19  5:23 AM  Result Value Ref Range   WBC 3.9 (L) 4.0 - 10.5 K/uL   RBC 4.56 3.87 - 5.11 MIL/uL   Hemoglobin 13.9 12.0 - 15.0 g/dL   HCT 40.7 36.0 - 46.0 %   MCV 89.3 80.0 - 100.0 fL   MCH 30.5 26.0 - 34.0 pg   MCHC 34.2 30.0 - 36.0 g/dL   RDW 11.8 11.5 - 15.5 %   Platelets 142 (L) 150 - 400 K/uL   nRBC 0.0 0.0 - 0.2 %    Comment: Performed at [facility], [place] [address] [place], [place] [address]  CBG monitoring, ED     Status: Abnormal   Collection Time: 06/02/19  5:59 AM  Result Value Ref Range   Glucose-Capillary 138 (H) 70 - 99 mg/dL  CBG monitoring, ED     Status: Abnormal   Collection Time: 06/02/19  9:34 AM  Result Value Ref Range   Glucose-Capillary 163 (H) 70 - 99 mg/dL  CBG monitoring, ED     Status: Abnormal   Collection Time: 06/02/19 12:14 PM  Result Value Ref Range   Glucose-Capillary 275 (H) 70 - 99 mg/dL  CBG monitoring, ED     Status: Abnormal   Collection Time: 06/02/19  3:51 PM  Result Value Ref Range   Glucose-Capillary 268 (H) 70 - 99 mg/dL   Ct Abdomen Pelvis Wo Contrast  Result Date: 06/01/2019 CLINICAL DATA:  Nausea  and vomiting, COVID-19 positivity EXAM: CT ABDOMEN AND PELVIS WITHOUT CONTRAST TECHNIQUE: Multidetector CT imaging of the abdomen and pelvis was performed following the standard protocol without IV contrast. COMPARISON:  None. FINDINGS: Lower chest: Patchy ground-glass opacities are noted in the bases bilaterally consistent with the given clinical history of COVID-19 positivity. Scattered areas of the reverse halo sign are noted as well. Hepatobiliary: No focal liver abnormality is seen. Status post cholecystectomy. No biliary dilatation. Pancreas: Unremarkable. No pancreatic ductal dilatation or surrounding inflammatory changes. Spleen: Normal in size without focal abnormality. Adrenals/Urinary Tract: Adrenal glands are within normal limits. The native kidneys are shrunken consistent with the known history of end-stage renal disease. Renal transplant is noted in the right lower quadrant. No obstructive changes are seen. No Perirenal inflammatory changes are seen. The bladder is decompressed. Stomach/Bowel: Scattered diverticular change of the colon is noted. The appendix is within normal limits. No small bowel or gastric abnormality is seen. Vascular/Lymphatic: Aortic atherosclerosis. No enlarged abdominal or pelvic lymph nodes. Reproductive: Status post  hysterectomy. No adnexal masses. Other: No abdominal wall hernia or abnormality. No abdominopelvic ascites. Musculoskeletal: No acute or significant osseous findings. IMPRESSION: Changes in the lung bases consistent with the known history of COVID-19 positivity. Changes consistent with end-stage renal disease and right lower quadrant renal transplant. No acute abnormality noted. Diverticulosis without diverticulitis. Electronically Signed   By: Inez Catalina M.D.   On: 06/01/2019 23:13   Dg Chest Portable 1 View  Result Date: 06/01/2019 CLINICAL DATA:  Cough.  COVID 19 positive EXAM: PORTABLE CHEST 1 VIEW COMPARISON:  01/04/2015 FINDINGS: The heart size and  mediastinal contours are within normal limits. Both lungs are clear. The visualized skeletal structures are unremarkable. IMPRESSION: Clear lungs. Electronically Signed   By: Ulyses Jarred M.D.   On: 06/01/2019 22:13    Pending Labs Unresulted Labs (From admission, onward)    Start     Ordered   06/09/19 0500  Creatinine, serum  (enoxaparin (LOVENOX)    CrCl < 30 ml/min)  Weekly,   R    Comments: while on enoxaparin therapy.    06/02/19 0010   06/03/19 0500  Comprehensive metabolic panel  Daily,   R     06/02/19 1032   06/03/19 0500  C-reactive protein  Daily,   R     06/02/19 1632   06/03/19 0500  D-dimer, quantitative (not at [facility])  Daily,   R     06/02/19 1632   06/03/19 0500  CBC  Tomorrow morning,   R     06/02/19 1632   06/03/19 0500  C-reactive protein  Tomorrow morning,   R     06/02/19 1633   06/02/19 1633  D-dimer, quantitative (not at [facility])  Once,   STAT     06/02/19 1633   06/02/19 1633  Lactate dehydrogenase  Once,   STAT     06/02/19 1633   06/01/19 2040  Urine culture  ONCE - STAT,   STAT     06/01/19 2039          Vitals/Pain Today's Vitals   06/02/19 1500 06/02/19 1515 06/02/19 1530 06/02/19 1545  BP: (!) 161/66 (!) 173/67 (!) 176/70 (!) 174/71  Pulse: 68 67 66 66  Resp: _0 Temp:      TempSrc:      SpO2: 93%  93% 94%  PainSc:        Isolation Precautions No active isolations  Medications Medications  dextrose 50 % solution 0-50 mL (has no administration in time range)  enoxaparin (LOVENOX) injection 40 mg (40 mg Subcutaneous Given 06/02/19 1007)  acetaminophen (TYLENOL) tablet 650 mg (650 mg Oral Given 06/02/19 1252)    Or  acetaminophen (TYLENOL) suppository 650 mg ( Rectal See Alternative 06/02/19 1252)  aspirin EC tablet 81 mg (81 mg Oral Given 06/02/19 1005)  amLODipine (NORVASC) tablet 10 mg (10 mg Oral Given 06/02/19 1006)  carvedilol (COREG) tablet 12.5 mg (12.5 mg Oral Given 06/02/19 1006)  rosuvastatin (CRESTOR) tablet 5 mg (has no  administration in time range)  FLUoxetine (PROZAC) capsule 40 mg (40 mg Oral Given 06/02/19 1008)  fludrocortisone (FLORINEF) tablet 0.1 mg (0.1 mg Oral Given 06/02/19 1007)  predniSONE (DELTASONE) tablet 5 mg (has no administration in time range)  docusate sodium (COLACE) capsule 100 mg (100 mg Oral Given 06/02/19 1004)  magnesium oxide (MAG-OX) tablet 400 mg (400 mg Oral Given 06/02/19 1005)  pantoprazole (PROTONIX) EC tablet 40 mg (40 mg Oral Given 06/02/19 1004)  mycophenolate (  MYFORTIC) EC tablet 360 mg (360 mg Oral Given 06/02/19 1201)  Tacrolimus ER TB24 3 mg (has no administration in time range)  gabapentin (NEURONTIN) capsule 300 mg (has no administration in time range)  loratadine (CLARITIN) tablet 10 mg (10 mg Oral Given 06/02/19 1005)  insulin aspart (novoLOG) injection 0-9 Units (5 Units Subcutaneous Given 06/02/19 1557)  insulin aspart (novoLOG) injection 0-5 Units (has no administration in time range)  0.9 %  sodium chloride infusion ( Intravenous New Bag/Given 06/02/19 1003)  remdesivir 200 mg in sodium chloride 0.9% 250 mL IVPB (0 mg Intravenous Stopped 06/02/19 1240)    Followed by  remdesivir 100 mg in sodium chloride 0.9 % 100 mL IVPB (has no administration in time range)  insulin glargine (LANTUS) injection 15 Units (has no administration in time range)  insulin glargine (LANTUS) injection 25 Units (has no administration in time range)  0.9 %  sodium chloride infusion (has no administration in time range)  hydrALAZINE (APRESOLINE) tablet 10 mg (has no administration in time range)  sodium chloride 0.9 % bolus 500 mL (0 mLs Intravenous Stopped 06/01/19 2110)  sodium chloride 0.9 % bolus 500 mL (0 mLs Intravenous Stopped 06/02/19 0113)  acetaminophen (TYLENOL) tablet 650 mg (650 mg Oral Given 06/01/19 2202)  insulin glargine (LANTUS) injection 10 Units (10 Units Subcutaneous Given 06/02/19 1003)    Mobility walks     Focused Assessments Pulmonary Assessment Handoff:  Lung  sounds: L Breath Sounds: Clear, Diminished R Breath Sounds: Clear, Diminished O2 Device: Room Air        R Recommendations: See Admitting Provider Note  Report given to:   Additional Notes:

## 2019-06-02 NOTE — Progress Notes (Signed)
PROGRESS NOTE    Cynthia [name]  FAO:[id number] DOB: [birth year] DOA: 06/01/2019 PCP: Katherina Mires, MD   Brief Narrative: 48 year old with past medical history significant for anxiety, hypertension, hyperlipidemia, diabetes type 2, anemia history of end-stage renal disease previously on dialysis status post transplant in 2015 at [facility], recently diagnosed with COVID-19 who presents with fatigue, sleepiness.  She report cough, shortness of breath.  She was having some abdominal pain as well.  Evaluation in the ED CT abdomen and pelvis show changes in the bases of the lung consistent with cough.  No acute intra-abdominal abnormalities.  Patient admitted with honk, AKI, hyponatremia and mild transaminases and Covid pneumonia.  Assessment & Plan:   Principal Problem:   Hyperglycemia Active Problems:   History of renal transplant   HTN (hypertension)   DKA (diabetic ketoacidoses) ([place])  1-Diabetes type 2 with hyperglycemia hyperosmolar glycemic state: Patient presented with blood sugar 500 range, bicarb at 20 gap normal. Patient was treated with IV fluids, insulin drip. He was transitioned to Lantus. We will increase Lantus to 25 units daily.  Continue with a sliding scale insulin.  2-COVID-19 pneumonia: Patient report cough, shortness of breath, CT showed abnormality at the bases of the lung consistent with Covid. Patient with multiple risk factor, obesity, hypertension diabetes.  I will start Remdesivir.  Could start a steroid if oxygen sat are below 94. Will need to adjust insulin regimen at that time.  Follow inflammatory markers. COVID-19 Labs  No results for input(s): DDIMER, FERRITIN, LDH, CRP in the last 72 hours.  Lab Results  Component Value Date   [place] (A) 06/02/2019   [place] NOT DETECTED 02/27/2019    3-AKI, prior history of end-stage renal disease status post renal transplant 2015: -Creatinine on admission at 1.5, suspect related to hypovolemia  related to dehydration and hyperglycemia. -Improved, creatinine down to 1.1 -We will continue with IV fluids. -If renal function gets worse, will need to monitor closely tacrolimus  level while patient is on Remdesivir.  -Continue with tacrolimus, prednisone, Myfortic.  4-HTN; continue with Norvasc, carvedilol. PRN hydralazine.   5-Hyponatremia: Setting of hyperglycemia pseudohyponatremia and in the setting of dehydration: Improved with IV fluids  6-Hyperkalemia: Resolved.  Related to AKI. 7-Mild transaminases: In the setting of COVID-19. Monitor while on remdesivir.  Estimated body mass index is 32.67 kg/m as calculated from the following:   Height as of 04/14/19: 5\' 2"  (1.575 m).   Weight as of 04/14/19: 81 kg.   DVT prophylaxis: Lovenox Code Status: Full code Family Communication: Care discussed with patient Disposition Plan: Patient to remain in the hospital for treatment of hyperglycemia and COVID-19 pneumonia. Consultants:   None  Procedures:   None  Antimicrobials: Others: Remdesivir 12-08  Subjective: Patient reports cough, shortness of breath.  She reports mild abdominal pain.  Objective: Vitals:   06/02/19 1445 06/02/19 1500 06/02/19 1515 06/02/19 1530  BP: (!) 157/67 (!) 161/66 (!) 173/67 (!) 176/70  Pulse: 70 68 67 66  Resp: 13 15 19 16   Temp:      TempSrc:      SpO2: 96% 93%  93%    Intake/Output Summary (Last 24 hours) at 06/02/2019 1624 Last data filed at 06/02/2019 1240 Gross per 24 hour  Intake 250 ml  Output 500 ml  Net -250 ml   There were no vitals filed for this visit.  Examination:  General exam: Appears calm and comfortable  Respiratory system: Clear to auscultation. Respiratory effort normal. Cardiovascular system: S1 &  S2 heard, RRR. No JVD, murmurs, rubs, gallops or clicks. No pedal edema. Gastrointestinal system: Abdomen is nondistended, soft and nontender. No organomegaly or masses felt. Normal bowel sounds heard. Central  nervous system: Alert and oriented. No focal neurological deficits. Extremities: Symmetric 5 x 5 power. Skin: No rashes, lesions or ulcers Psychiatry: Judgement and insight appear normal. Mood & affect appropriate.     Data Reviewed: I have personally reviewed following labs and imaging studies  CBC: Recent Labs  Lab 06/01/19 1816 06/01/19 2210 06/02/19 0523  WBC 5.7  --  3.9*  HGB 14.5 14.6 13.9  HCT 41.8 43.0 40.7  MCV 89.7  --  89.3  PLT 147*  --  144*   Basic Metabolic Panel: Recent Labs  Lab 06/01/19 1816 06/01/19 2210 06/02/19 0230 06/02/19 0523  NA 126* 126* 131* 133*  K 4.9 5.7* 5.1 4.4  CL 93*  --  101 103  CO2 22  --  20* 20*  GLUCOSE 541*  --  399* 160*  BUN 26*  --  26* 25*  CREATININE 1.51*  --  1.30* 1.18*  CALCIUM 10.0  --  9.4 9.5   GFR: CrCl cannot be calculated (Unknown ideal weight.). Liver Function Tests: Recent Labs  Lab 06/01/19 1816  AST 46*  ALT 51*  ALKPHOS 84  BILITOT 0.9  PROT 6.7  ALBUMIN 3.6   Recent Labs  Lab 06/01/19 1816  LIPASE 17   No results for input(s): AMMONIA in the last 168 hours. Coagulation Profile: No results for input(s): INR, PROTIME in the last 168 hours. Cardiac Enzymes: No results for input(s): CKTOTAL, CKMB, CKMBINDEX, TROPONINI in the last 168 hours. BNP (last 3 results) No results for input(s): PROBNP in the last 8760 hours. HbA1C: No results for input(s): HGBA1C in the last 72 hours. CBG: Recent Labs  Lab 06/02/19 0439 06/02/19 0559 06/02/19 0934 06/02/19 1214 06/02/19 1551  GLUCAP 191* 138* 163* 275* 268*   Lipid Profile: No results for input(s): CHOL, HDL, LDLCALC, TRIG, CHOLHDL, LDLDIRECT in the last 72 hours. Thyroid Function Tests: No results for input(s): TSH, T4TOTAL, FREET4, T3FREE, THYROIDAB in the last 72 hours. Anemia Panel: No results for input(s): VITAMINB12, FOLATE, FERRITIN, TIBC, IRON, RETICCTPCT in the last 72 hours. Sepsis Labs: No results for input(s): PROCALCITON,  LATICACIDVEN in the last 168 hours.  Recent Results (from the past 240 hour(s))  SARS CORONAVIRUS 2 (TAT 6-24 HRS) Nasopharyngeal Nasopharyngeal Swab     Status: Abnormal   Collection Time: 06/02/19 12:18 AM   Specimen: Nasopharyngeal Swab  Result Value Ref Range Status   SARS Coronavirus 2 POSITIVE (A) NEGATIVE Final    Comment: RESULT CALLED TO, READ BACK BY AND VERIFIED WITH: Julene ROBERTSON RN.@[email or web address]  ON 12.8.2020 BY TCALDWELL MT. (NOTE) SARS-CoV-2 target nucleic acids are DETECTED. The SARS-CoV-2 RNA is generally detectable in upper and lower respiratory specimens during the acute phase of infection. Positive results are indicative of the presence of SARS-CoV-2 RNA. Clinical correlation with patient history and other diagnostic information is  necessary to determine patient infection status. Positive results do not rule out bacterial infection or co-infection with other viruses.  The expected result is Negative. Fact Sheet for Patients: SugarRoll.be Fact Sheet for Healthcare Providers: https://www.woods-mathews.com/ This test is not yet approved or cleared by the [place] FDA and  has been authorized for detection and/or diagnosis of SARS-CoV-2 by FDA under an Emergency Use Authorization (EUA). This EUA will remain  in effect (meaning this te st can be used)  for the duration of the COVID-19 declaration under Section 564(b)(1) of the Act, 21 U.S.C. section 360bbb-3(b)(1), unless the authorization is terminated or revoked sooner. Performed at [facility], [place] [address], [place], [place] [address]          Radiology Studies: Ct Abdomen Pelvis Wo Contrast  Result Date: 06/01/2019 CLINICAL DATA:  Nausea and vomiting, COVID-19 positivity EXAM: CT ABDOMEN AND PELVIS WITHOUT CONTRAST TECHNIQUE: Multidetector CT imaging of the abdomen and pelvis was performed following the standard protocol without IV contrast. COMPARISON:   None. FINDINGS: Lower chest: Patchy ground-glass opacities are noted in the bases bilaterally consistent with the given clinical history of COVID-19 positivity. Scattered areas of the reverse halo sign are noted as well. Hepatobiliary: No focal liver abnormality is seen. Status post cholecystectomy. No biliary dilatation. Pancreas: Unremarkable. No pancreatic ductal dilatation or surrounding inflammatory changes. Spleen: Normal in size without focal abnormality. Adrenals/Urinary Tract: Adrenal glands are within normal limits. The native kidneys are shrunken consistent with the known history of end-stage renal disease. Renal transplant is noted in the right lower quadrant. No obstructive changes are seen. No Perirenal inflammatory changes are seen. The bladder is decompressed. Stomach/Bowel: Scattered diverticular change of the colon is noted. The appendix is within normal limits. No small bowel or gastric abnormality is seen. Vascular/Lymphatic: Aortic atherosclerosis. No enlarged abdominal or pelvic lymph nodes. Reproductive: Status post hysterectomy. No adnexal masses. Other: No abdominal wall hernia or abnormality. No abdominopelvic ascites. Musculoskeletal: No acute or significant osseous findings. IMPRESSION: Changes in the lung bases consistent with the known history of COVID-19 positivity. Changes consistent with end-stage renal disease and right lower quadrant renal transplant. No acute abnormality noted. Diverticulosis without diverticulitis. Electronically Signed   By: Inez Catalina M.D.   On: 06/01/2019 23:13   Dg Chest Portable 1 View  Result Date: 06/01/2019 CLINICAL DATA:  Cough.  COVID 19 positive EXAM: PORTABLE CHEST 1 VIEW COMPARISON:  01/04/2015 FINDINGS: The heart size and mediastinal contours are within normal limits. Both lungs are clear. The visualized skeletal structures are unremarkable. IMPRESSION: Clear lungs. Electronically Signed   By: Ulyses Jarred M.D.   On: 06/01/2019 22:13         Scheduled Meds: . amLODipine  10 mg Oral Daily  . aspirin EC  81 mg Oral Daily  . carvedilol  12.5 mg Oral BID WC  . docusate sodium  100 mg Oral BID  . enoxaparin (LOVENOX) injection  40 mg Subcutaneous Daily  . fludrocortisone  0.1 mg Oral Daily  . FLUoxetine  40 mg Oral Daily  . gabapentin  300 mg Oral QHS  . insulin aspart  0-5 Units Subcutaneous QHS  . insulin aspart  0-9 Units Subcutaneous TID WC  . insulin glargine  15 Units Subcutaneous Once  . [START ON 06/03/2019] insulin glargine  25 Units Subcutaneous Daily  . loratadine  10 mg Oral Daily  . magnesium oxide  400 mg Oral BID  . mycophenolate  360 mg Oral BID  . pantoprazole  40 mg Oral Daily  . predniSONE  5 mg Oral Q breakfast  . rosuvastatin  5 mg Oral QHS  . sodium chloride flush  3 mL Intravenous Q12H  . Tacrolimus ER  3 mg Oral [id number]   Continuous Infusions: . sodium chloride 100 mL/hr at 06/02/19 1003  . [START ON 06/03/2019] remdesivir 100 mg in NS 100 mL       LOS: 0 days    Time spent:35 minutes.  Elmarie Shiley, MD [facility]   If 7PM-7AM, please contact night-coverage [email or web address] Password [facility] 06/02/2019, 4:24 PM

## 2019-06-02 NOTE — H&P (Signed)
TRH H&P    Patient Demographics:    Cynthia [name], is a 48 y.o. female  MRN: [id number]  DOB - 02-24-1971  Admit Date - 06/01/2019  Referring MD/NP/PA:   Burns Spain  Outpatient Primary MD for the patient is Briscoe, Jannifer Rodney, MD  Patient coming from:  home  Chief complaint-  Fatigue, sleepy   HPI:    Cynthia [name]  is a 48 y.o. female,  w Anxiety, hypertension, hyperlipidemia, Dm2, Anemia, h/o ESRD, previously on dialysis s/p transplant followed by [facility], recently diagnosed with Covid-19,  apparently presents with c/o fatigue, sleepiness. Slight dry cough. Denies fever, chills, cp, palp, sob, n/v, diarrhea.    In ED,  T 98.3, P 71 R 16, Bp 111/50  Pox 96% on RA  CXR IMPRESSION: Clear lungs.  CT abd/ pelvis IMPRESSION: Changes in the lung bases consistent with the known history of COVID-19 positivity.  Changes consistent with end-stage renal disease and right lower  quadrant renal transplant. No acute abnormality noted.  Diverticulosis without diverticulitis.  Na 126, K 4.9,  Glucose 541 Bun 26, Creatinine 1.51 Ast 46, Alt 51 Wbc 5.7, Hgb 14.5, Plt 147 Urinalysis negative  Pt will require admission for hyperglycemia, and mild ARF and abnormal liver function      Review of systems:    In addition to the HPI above,  No Fever-chills, No Headache, No changes with Vision or hearing, No problems swallowing food or Liquids, No Chest pain,No Shortness of Breath, No Abdominal pain, No Nausea or Vomiting, bowel movements are regular, No Blood in stool or Urine, No dysuria, No new skin rashes or bruises, No new joints pains-aches,  No new weakness, tingling, numbness in any extremity, No recent weight gain or loss, No polyuria, polydypsia or polyphagia, No significant Mental Stressors.  All other systems reviewed and are negative.    Past History of the following :    Past  Medical History:  Diagnosis Date  . Anemia    when on dialysis  . Anxiety   . Depression   . ESRD on hemodialysis ([place])    Home HD 5x per week- not on dialysis now had tramsplant 4/17  . GERD (gastroesophageal reflux disease)   . History of blood transfusion    transfusion reaction  . Hyperlipidemia   . Hypertension   . 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 per pt-dr levy also II  . Kidney transplant recipient   . Neuromuscular disorder ([place])    NEUROPATHY      Past Surgical History:  Procedure Laterality Date  . ABDOMINAL HYSTERECTOMY    . BREAST EXCISIONAL BIOPSY Right 08/2015  . BREAST LUMPECTOMY WITH RADIOACTIVE SEED LOCALIZATION Right 09/14/2015   Procedure: RIGHT BREAST LUMPECTOMY WITH RADIOACTIVE SEED LOCALIZATION;  Surgeon: Donnie Mesa, MD;  Location: [place];  Service: General;  Laterality: Right;  . BREAST SURGERY Bilateral    biopsy bilateral  . CATARACT EXTRACTION Bilateral    bilateral  . CHOLECYSTECTOMY    .  CYST REMOVAL NECK    . DIALYSIS FISTULA CREATION Left   . EYE SURGERY Bilateral    lazer  . LEFT HEART CATH AND CORONARY ANGIOGRAPHY N/A 03/04/2019   Procedure: LEFT HEART CATH AND CORONARY ANGIOGRAPHY;  Surgeon: Martinique, Peter M, MD;  Location: [place] CV LAB;  Service: Cardiovascular;  Laterality: N/A;  . LEFT HEART CATHETERIZATION WITH CORONARY ANGIOGRAM N/A 03/30/2014   Procedure: LEFT HEART CATHETERIZATION WITH CORONARY ANGIOGRAM;  Surgeon: Sinclair Grooms, MD;  Location: [facility] CATH LAB;  Service: Cardiovascular;  Laterality: N/A;  . LIPOMA EXCISION N/A 02/06/2017   Procedure: EXCISION POSTERIOR NECK SEBACEOUS CYST;  Surgeon: Coralie Keens, MD;  Location: [place];  Service: General;  Laterality: N/A;  . 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 REVISON OF ARTERIOVENOUS FISTULA Left 09/28/2013   Procedure: EXCISE ESCHAR LEFT ARM  ARTERIOVENOUS FISTULA WITH PLICATION OF LEFT ARM ARTERIOVENOUS FISTULA;  Surgeon: Elam Dutch, MD;  Location: [place];  Service: Vascular;  Laterality: Left;  . REVISON OF ARTERIOVENOUS FISTULA Left 08/26/2015   Procedure: RESECTION ANEURYSM OF LEFT ARM ARTERIOVENOUS FISTULA  ;  Surgeon: Serafina Mitchell, MD;  Location: [place];  Service: Vascular;  Laterality: Left;  . TUBAL LIGATION        Social History:      Social History   Tobacco Use  . Smoking status: Never Smoker  . Smokeless tobacco: Never Used  Substance Use Topics  . Alcohol use: No       Family History :     Family History  Problem Relation Age of Onset  . Cancer Maternal Grandmother   . Diabetes Maternal Grandfather   . Diabetes Paternal Grandmother   . Diabetes Mother   . Kidney disease Mother   . Diabetes Father   . Heart disease Father   . Kidney disease Father   . Diabetes Sister   . Asthma Sister   . Lupus Sister   . Diabetes Brother        Home Medications:   Prior to Admission medications   Medication Sig Start Date End Date Taking? Authorizing Provider  amLODipine (NORVASC) 10 MG tablet Take 10 mg by mouth daily. 05/12/19   [provider]  aspirin EC 81 MG EC tablet Take 1 tablet (81 mg total) by mouth daily. Patient taking differently: Take 81 mg by mouth at bedtime.  03/31/14   Katheren Shams, DO  B Complex-C (SUPER B COMPLEX PO) Take 1 tablet by mouth at bedtime.    [provider]  carvedilol (COREG) 25 MG tablet Take 12.5 mg by mouth 2 (two) times daily with a meal.  11/23/15   [provider]  cetirizine (ZYRTEC) 10 MG tablet Take 5 mg by mouth daily. Takes 0.5 tablet daily    [provider]  Cholecalciferol (VITAMIN D3) 1000 units CAPS Take 1,000 Units by mouth daily.  02/16/16   [provider]  docusate sodium (COLACE) 100 MG  capsule Take 100 mg by mouth 2 (two) times daily.    [provider]  fludrocortisone (FLORINEF) 0.1 MG tablet Take 0.1 mg by mouth daily.    [provider]  FLUoxetine (PROZAC) 40 MG capsule Take 40 mg by mouth daily.    [provider]  gabapentin (NEURONTIN) 300 MG capsule Take 300 mg by  mouth at bedtime. 11/16/14   [provider]  glucose blood (ONETOUCH VERIO) test strip 1 each by Other route 2 (two) times daily. And lancets 2/day 04/14/19   Renato Shin, MD  Insulin Glargine (LANTUS SOLOSTAR) 100 UNIT/ML Solostar Pen Inject 30 Units into the skin every morning. 04/14/19   Renato Shin, MD  magnesium oxide (MAG-OX) 400 MG tablet Take 400 mg by mouth 2 (two) times daily.  12/09/15   [provider]  mycophenolate (MYFORTIC) 180 MG EC tablet Take 360 mg by mouth 2 (two) times daily.    [provider]  pantoprazole (PROTONIX) 40 MG tablet Take 40 mg by mouth daily. 08/02/15   [provider]  predniSONE (DELTASONE) 5 MG tablet TAKE 1 TABLET DAILY 01/04/17   [provider]  rosuvastatin (CRESTOR) 5 MG tablet Take 5 mg by mouth at bedtime.    [provider]  Tacrolimus ER (ENVARSUS XR) 1 MG TB24 Take 3 mg by mouth every morning.    [provider]  vitamin E 400 UNIT capsule Take 400 Units by mouth daily.    [provider]     Allergies:     Allergies  Allergen Reactions  . Other Rash    Burns skin.Please use paper tape only Burns skin.Please use paper tape only  . Tape Other (See Comments)    Burns skin.  Please use paper tape only     Physical Exam:   Vitals  Blood pressure (!) 157/62, pulse 68, temperature 98.3 F (36.8 C), temperature source Oral, resp. rate 20, SpO2 95 %.  1.  General: axoxo3  2. Psychiatric: euthymic  3. Neurologic: nonfocal  4. HEENMT:  Anicteric, pupils 1.71mm symmetric, direct, consensual, near intact Neck: no jvd  5. Respiratory : CTAB   6. Cardiovascular : rrr s1, s2, no m/g/r  7. Gastrointestinal:  Abd: soft, nt, nd, +bs  8. Skin:  Ext: no c/c/e, no rash  9.Musculoskeletal:  Good ROM    Data Review:    CBC Recent Labs  Lab 06/01/19 1816 06/01/19 2210  WBC 5.7  --   HGB 14.5 14.6  HCT 41.8 43.0  PLT 147*  --   MCV 89.7  --   MCH 31.1  --   MCHC 34.7  --   RDW 11.9  --    ------------------------------------------------------------------------------------------------------------------  Results for orders placed or performed during the hospital encounter of 06/01/19 (from the past 48 hour(s))  Lipase, blood     Status: None   Collection Time: 06/01/19  6:16 PM  Result Value Ref Range   Lipase 17 11 - 51 U/L    Comment: Performed at [facility], [place] [address] [place], [place] [address]  Comprehensive metabolic panel     Status: Abnormal   Collection Time: 06/01/19  6:16 PM  Result Value Ref Range   Sodium 126 (L) 135 - 145 mmol/L   Potassium 4.9 3.5 - 5.1 mmol/L   Chloride 93 (L) 98 - 111 mmol/L   CO2 22 22 - 32 mmol/L   Glucose, Bld 541 (HH) 70 - 99 mg/dL    Comment: CRITICAL RESULT CALLED TO, READ BACK BY AND VERIFIED WITH: K FIELDS,RN 1907 06/01/2019 WBOND    BUN 26 (H) 6 - 20 mg/dL   Creatinine, Ser 1.51 (H) 0.44 - 1.00 mg/dL   Calcium 10.0 8.9 - 10.3 mg/dL   Total Protein 6.7 6.5 - 8.1 g/dL   Albumin 3.6 3.5 - 5.0 g/dL  AST 46 (H) 15 - 41 U/L   ALT 51 (H) 0 - 44 U/L   Alkaline Phosphatase 84 38 - 126 U/L   Total Bilirubin 0.9 0.3 - 1.2 mg/dL   GFR calc non Af Amer 40 (L) >60 mL/min   GFR calc Af Amer 46 (L) >60 mL/min   Anion gap 11 5 - 15    Comment: Performed at [facility] [address] [place], [place] [address]  CBC     Status: Abnormal   Collection Time: 06/01/19  6:16 PM  Result Value Ref Range   WBC 5.7 4.0 - 10.5 K/uL   RBC 4.66 3.87 - 5.11 MIL/uL   Hemoglobin 14.5 12.0 - 15.0 g/dL   HCT 41.8 36.0 - 46.0 %   MCV 89.7 80.0 - 100.0 fL   MCH 31.1 26.0  - 34.0 pg   MCHC 34.7 30.0 - 36.0 g/dL   RDW 11.9 11.5 - 15.5 %   Platelets 147 (L) 150 - 400 K/uL   nRBC 0.0 0.0 - 0.2 %    Comment: Performed at [facility], [place] [address], [place], [place] [address]  Urinalysis, Routine w reflex microscopic     Status: Abnormal   Collection Time: 06/01/19  8:30 PM  Result Value Ref Range   Color, Urine AMBER (A) YELLOW    Comment: BIOCHEMICALS MAY BE AFFECTED BY COLOR   APPearance CLOUDY (A) CLEAR   Specific Gravity, Urine 1.029 1.005 - 1.030   pH 5.0 5.0 - 8.0   Glucose, UA >=500 (A) NEGATIVE mg/dL   Hgb urine dipstick NEGATIVE NEGATIVE   Bilirubin Urine NEGATIVE NEGATIVE   Ketones, ur 5 (A) NEGATIVE mg/dL   Protein, ur NEGATIVE NEGATIVE mg/dL   Nitrite NEGATIVE NEGATIVE   Leukocytes,Ua NEGATIVE NEGATIVE   RBC / HPF 0-5 0 - 5 RBC/hpf   WBC, UA 0-5 0 - 5 WBC/hpf   Bacteria, UA RARE (A) NONE SEEN   Squamous Epithelial / LPF 0-5 0 - 5   Mucus PRESENT    Hyaline Casts, UA PRESENT     Comment: Performed at [facility], [address], [place], [place] [address]  CBG monitoring, ED     Status: Abnormal   Collection Time: 06/01/19  9:13 PM  Result Value Ref Range   Glucose-Capillary 504 (HH) 70 - 99 mg/dL   Comment 1 Document in Chart   I-Stat beta hCG blood, ED     Status: None   Collection Time: 06/01/19 10:09 PM  Result Value Ref Range   I-stat hCG, quantitative <5.0 <5 mIU/mL   Comment 3            Comment:   GEST. AGE      CONC.  (mIU/mL)   <=1 WEEK        5 - 50     2 WEEKS       50 - 500     3 WEEKS       100 - 10,000     4 WEEKS     1,000 - 30,000        FEMALE AND NON-PREGNANT FEMALE:     LESS THAN 5 mIU/mL   POCT I-Stat EG7     Status: Abnormal   Collection Time: 06/01/19 10:10 PM  Result Value Ref Range   pH, Ven 7.382 7.250 - 7.430   pCO2, Ven 35.7 (L) 44.0 - 60.0 mmHg   pO2, Ven 145.0 (H) 32.0 -  45.0 mmHg   Bicarbonate 21.2 20.0 - 28.0 mmol/L   TCO2 22 22 - 32 mmol/L   O2 Saturation 99.0 %   Acid-base  deficit 3.0 (H) 0.0 - 2.0 mmol/L   Sodium 126 (L) 135 - 145 mmol/L   Potassium 5.7 (H) 3.5 - 5.1 mmol/L   Calcium, Ion 1.30 1.15 - 1.40 mmol/L   HCT 43.0 36.0 - 46.0 %   Hemoglobin 14.6 12.0 - 15.0 g/dL   Patient temperature HIDE    Sample type VENOUS   CBG monitoring, ED     Status: Abnormal   Collection Time: 06/01/19 11:57 PM  Result Value Ref Range   Glucose-Capillary 461 (H) 70 - 99 mg/dL  CBG monitoring, ED     Status: Abnormal   Collection Time: 06/02/19  1:04 AM  Result Value Ref Range   Glucose-Capillary 433 (H) 70 - 99 mg/dL    Chemistries  Recent Labs  Lab 06/01/19 1816 06/01/19 2210  NA 126* 126*  K 4.9 5.7*  CL 93*  --   CO2 22  --   GLUCOSE 541*  --   BUN 26*  --   CREATININE 1.51*  --   CALCIUM 10.0  --   AST 46*  --   ALT 51*  --   ALKPHOS 84  --   BILITOT 0.9  --    ------------------------------------------------------------------------------------------------------------------  ------------------------------------------------------------------------------------------------------------------ GFR: CrCl cannot be calculated (Unknown ideal weight.). Liver Function Tests: Recent Labs  Lab 06/01/19 1816  AST 46*  ALT 51*  ALKPHOS 84  BILITOT 0.9  PROT 6.7  ALBUMIN 3.6   Recent Labs  Lab 06/01/19 1816  LIPASE 17   No results for input(s): AMMONIA in the last 168 hours. Coagulation Profile: No results for input(s): INR, PROTIME in the last 168 hours. Cardiac Enzymes: No results for input(s): CKTOTAL, CKMB, CKMBINDEX, TROPONINI in the last 168 hours. BNP (last 3 results) No results for input(s): PROBNP in the last 8760 hours. HbA1C: No results for input(s): HGBA1C in the last 72 hours. CBG: Recent Labs  Lab 06/01/19 2113 06/01/19 2357 06/02/19 0104  GLUCAP 504* 461* 433*   Lipid Profile: No results for input(s): CHOL, HDL, LDLCALC, TRIG, CHOLHDL, LDLDIRECT in the last 72 hours. Thyroid Function Tests: No results for input(s): TSH,  T4TOTAL, FREET4, T3FREE, THYROIDAB in the last 72 hours. Anemia Panel: No results for input(s): VITAMINB12, FOLATE, FERRITIN, TIBC, IRON, RETICCTPCT in the last 72 hours.  --------------------------------------------------------------------------------------------------------------- Urine analysis:    Component Value Date/Time   COLORURINE AMBER (A) 06/01/2019 2030   APPEARANCEUR CLOUDY (A) 06/01/2019 2030   LABSPEC 1.029 06/01/2019 2030   PHURINE 5.0 06/01/2019 2030   GLUCOSEU >=500 (A) 06/01/2019 2030   HGBUR NEGATIVE 06/01/2019 2030   [place] NEGATIVE 06/01/2019 2030   KETONESUR 5 (A) 06/01/2019 2030   PROTEINUR NEGATIVE 06/01/2019 2030   UROBILINOGEN 0.2 05/15/2013 1100   NITRITE NEGATIVE 06/01/2019 2030   LEUKOCYTESUR NEGATIVE 06/01/2019 2030      Imaging Results:    Ct Abdomen Pelvis Wo Contrast  Result Date: 06/01/2019 CLINICAL DATA:  Nausea and vomiting, COVID-19 positivity EXAM: CT ABDOMEN AND PELVIS WITHOUT CONTRAST TECHNIQUE: Multidetector CT imaging of the abdomen and pelvis was performed following the standard protocol without IV contrast. COMPARISON:  None. FINDINGS: Lower chest: Patchy ground-glass opacities are noted in the bases bilaterally consistent with the given clinical history of COVID-19 positivity. Scattered areas of the reverse halo sign are noted as well. Hepatobiliary: No focal liver abnormality is seen.  Status post cholecystectomy. No biliary dilatation. Pancreas: Unremarkable. No pancreatic ductal dilatation or surrounding inflammatory changes. Spleen: Normal in size without focal abnormality. Adrenals/Urinary Tract: Adrenal glands are within normal limits. The native kidneys are shrunken consistent with the known history of end-stage renal disease. Renal transplant is noted in the right lower quadrant. No obstructive changes are seen. No Perirenal inflammatory changes are seen. The bladder is decompressed. Stomach/Bowel: Scattered diverticular change of  the colon is noted. The appendix is within normal limits. No small bowel or gastric abnormality is seen. Vascular/Lymphatic: Aortic atherosclerosis. No enlarged abdominal or pelvic lymph nodes. Reproductive: Status post hysterectomy. No adnexal masses. Other: No abdominal wall hernia or abnormality. No abdominopelvic ascites. Musculoskeletal: No acute or significant osseous findings. IMPRESSION: Changes in the lung bases consistent with the known history of COVID-19 positivity. Changes consistent with end-stage renal disease and right lower quadrant renal transplant. No acute abnormality noted. Diverticulosis without diverticulitis. Electronically Signed   By: Inez Catalina M.D.   On: 06/01/2019 23:13   Dg Chest Portable 1 View  Result Date: 06/01/2019 CLINICAL DATA:  Cough.  COVID 19 positive EXAM: PORTABLE CHEST 1 VIEW COMPARISON:  01/04/2015 FINDINGS: The heart size and mediastinal contours are within normal limits. Both lungs are clear. The visualized skeletal structures are unremarkable. IMPRESSION: Clear lungs. Electronically Signed   By: Ulyses Jarred M.D.   On: 06/01/2019 22:13       Assessment & Plan:    Principal Problem:   Hyperglycemia Active Problems:   History of renal transplant   HTN (hypertension)   Hyperglycemia Ns iv Glucose stabilizer Bmp q4h x5  ARF Hydrate with ns iv Check cmp in am  Hyponatremia (pseudohyponatremia) Hydrate with ns iv Check cmp in am  Abnormal liver function, likely from covid -19 Check cpk  Check acute hepatitis panel Check RUQ ultrasound Check cmp in am  Covid -19 Monitor Consider steroids/ remdesivir  ESRD s/p transplant Cont Tracrolimus Cont Mycophenolate Cont Aspirin  Dm2 fsbs ac and qhs, iss  Dm2 neuropathy Cont Gabapentin  Hypertension Cont Amlodipine 10mg  po qday Cont Carvedilol 25mg  po bid  Gerd Cont PPI   DVT Prophylaxis-   Lovenox - SCDs   AM Labs Ordered, also please review Full Orders  Family  Communication: Admission, patients condition and plan of care including tests being ordered have been discussed with the patient  who indicate understanding and agree with the plan and Code Status.  Code Status:  FULL CODE per patient  Admission status: Observation: Based on patients clinical presentation and evaluation of above clinical data, I have made determination that patient meets observation criteria at this time.    Time spent in minutes : 55 minutes   Jani Gravel M.D on 06/02/2019 at 2:13 AM

## 2019-06-02 NOTE — ED Notes (Signed)
Pt has breakfast tray at bedside.  

## 2019-06-02 NOTE — ED Notes (Signed)
Lunch Tray Ordered @ 1042. 

## 2019-06-02 NOTE — ED Notes (Signed)
Report given to Antionette, Therapist, sports at [facility]

## 2019-06-03 DIAGNOSIS — E1165 Type 2 diabetes mellitus with hyperglycemia: Secondary | ICD-10-CM

## 2019-06-03 DIAGNOSIS — J1289 Other viral pneumonia: Secondary | ICD-10-CM

## 2019-06-03 DIAGNOSIS — U071 COVID-19: Principal | ICD-10-CM

## 2019-06-03 LAB — PHOSPHORUS: Phosphorus: 1.4 mg/dL — ABNORMAL LOW (ref 2.5–4.6)

## 2019-06-03 LAB — COMPREHENSIVE METABOLIC PANEL
ALT: 39 U/L (ref 0–44)
AST: 30 U/L (ref 15–41)
Albumin: 3.3 g/dL — ABNORMAL LOW (ref 3.5–5.0)
Alkaline Phosphatase: 63 U/L (ref 38–126)
Anion gap: 8 (ref 5–15)
BUN: 18 mg/dL (ref 6–20)
CO2: 22 mmol/L (ref 22–32)
Calcium: 9.2 mg/dL (ref 8.9–10.3)
Chloride: 104 mmol/L (ref 98–111)
Creatinine, Ser: 0.77 mg/dL (ref 0.44–1.00)
GFR calc Af Amer: >60 mL/min (ref >60)
GFR calc non Af Amer: >60 mL/min (ref >60)
Glucose, Bld: 214 mg/dL — ABNORMAL HIGH (ref 70–99)
Potassium: 4.5 mmol/L (ref 3.5–5.1)
Sodium: 134 mmol/L — ABNORMAL LOW (ref 135–145)
Total Bilirubin: 0.7 mg/dL (ref 0.3–1.2)
Total Protein: 6.1 g/dL — ABNORMAL LOW (ref 6.5–8.1)

## 2019-06-03 LAB — GLUCOSE, CAPILLARY
Glucose-Capillary: 203 mg/dL — ABNORMAL HIGH (ref 70–99)
Glucose-Capillary: 247 mg/dL — ABNORMAL HIGH (ref 70–99)
Glucose-Capillary: 323 mg/dL — ABNORMAL HIGH (ref 70–99)
Glucose-Capillary: 209 mg/dL — ABNORMAL HIGH (ref 70–99)

## 2019-06-03 LAB — CBC WITH DIFFERENTIAL/PLATELET
Abs Immature Granulocytes: 0.01 10*3/uL (ref 0.00–0.07)
Basophils Absolute: 0 10*3/uL (ref 0.0–0.1)
Basophils Relative: 0 %
Eosinophils Absolute: 0 10*3/uL (ref 0.0–0.5)
Eosinophils Relative: 0 %
HCT: 38.9 % (ref 36.0–46.0)
Hemoglobin: 12.9 g/dL (ref 12.0–15.0)
Immature Granulocytes: 0 %
Lymphocytes Relative: 29 %
Lymphs Abs: 0.9 10*3/uL (ref 0.7–4.0)
MCH: 30 pg (ref 26.0–34.0)
MCHC: 33.2 g/dL (ref 30.0–36.0)
MCV: 90.5 fL (ref 80.0–100.0)
Monocytes Absolute: 0.4 10*3/uL (ref 0.1–1.0)
Monocytes Relative: 11 %
Neutro Abs: 1.9 10*3/uL (ref 1.7–7.7)
Neutrophils Relative %: 60 %
Platelets: 155 10*3/uL (ref 150–400)
RBC: 4.3 MIL/uL (ref 3.87–5.11)
RDW: 11.9 % (ref 11.5–15.5)
WBC: 3.2 10*3/uL — ABNORMAL LOW (ref 4.0–10.5)
nRBC: 0 % (ref 0.0–0.2)

## 2019-06-03 LAB — C-REACTIVE PROTEIN: CRP: 5 mg/dL — ABNORMAL HIGH (ref <1.0)

## 2019-06-03 LAB — D-DIMER, QUANTITATIVE: D-Dimer, Quant: 0.37 ug{FEU}/mL (ref 0.00–0.50)

## 2019-06-03 LAB — MAGNESIUM: Magnesium: 1.6 mg/dL — ABNORMAL LOW (ref 1.7–2.4)

## 2019-06-03 LAB — FERRITIN: Ferritin: 2233 ng/mL — ABNORMAL HIGH (ref 11–307)

## 2019-06-03 MED ORDER — INSULIN GLARGINE 100 UNIT/ML ~~LOC~~ SOLN
5.0000 [IU] | Freq: Once | SUBCUTANEOUS | Status: AC
  Administered 2019-06-03: 5 [IU] via SUBCUTANEOUS
  Filled 2019-06-03: qty 0.05

## 2019-06-03 MED ORDER — INSULIN GLARGINE 100 UNIT/ML ~~LOC~~ SOLN
30.0000 [IU] | Freq: Every day | SUBCUTANEOUS | Status: DC
  Administered 2019-06-04 – 2019-06-06 (×3): 30 [IU] via SUBCUTANEOUS
  Filled 2019-06-03 (×3): qty 0.3

## 2019-06-03 MED ORDER — TACROLIMUS ER 1 MG PO TB24
3.0000 mg | ORAL_TABLET | Freq: Every day | ORAL | Status: DC
  Filled 2019-06-03: qty 3

## 2019-06-03 MED ORDER — TACROLIMUS ER 1 MG PO TB24
3.0000 mg | ORAL_TABLET | Freq: Every day | ORAL | Status: DC
  Administered 2019-06-03 – 2019-06-06 (×4): 3 mg via ORAL
  Filled 2019-06-03 (×4): qty 3

## 2019-06-03 MED ORDER — TACROLIMUS ER 1 MG PO TB24
3.0000 | ORAL_TABLET | Freq: Every day | ORAL | Status: DC
  Filled 2019-06-03: qty 3

## 2019-06-03 NOTE — Progress Notes (Signed)
Pharmacy Medication Storage Note  Storing home medications for Cynthia [name] in pharmacy secured storage.   Medication storage bag number: [id number]  Delivered to pharmacy @ 0945  Medications will be returned to patient/caregiver upon discharge.  Cynthia [name] 06/03/19 10:10 AM

## 2019-06-03 NOTE — Plan of Care (Signed)
  Problem: Respiratory: Goal: Will maintain a patent airway Outcome: Progressing Goal: Complications related to the disease process, condition or treatment will be avoided or minimized Outcome: Progressing   Problem: Education: Goal: Knowledge of General Education information will improve Description: Including pain rating scale, medication(s)/side effects and non-pharmacologic comfort measures Outcome: Progressing   Problem: Health Behavior/Discharge Planning: Goal: Ability to manage health-related needs will improve Outcome: Progressing   Problem: Clinical Measurements: Goal: Ability to maintain clinical measurements within normal limits will improve Outcome: Progressing Goal: Will remain free from infection Outcome: Progressing Goal: Respiratory complications will improve Outcome: Progressing Goal: Cardiovascular complication will be avoided Outcome: Progressing   Problem: Nutrition: Goal: Adequate nutrition will be maintained Outcome: Progressing   Problem: Skin Integrity: Goal: Risk for impaired skin integrity will decrease Outcome: Progressing

## 2019-06-03 NOTE — Progress Notes (Signed)
PROGRESS NOTE    Cynthia [name]  VEL:[id number] DOB: 02-Dec-1970 DOA: 06/01/2019 PCP: Katherina Mires, MD   Brief Narrative: 48 year old with past medical history significant for anxiety, hypertension, hyperlipidemia, diabetes type 2, anemia history of end-stage renal disease previously on dialysis status post transplant in 2015 at [facility], recently diagnosed with COVID-19 who presented with fatigue, sleepiness.  She reported cough and shortness of breath.  She also was experiencing abdominal pain.  Chest x-ray did not show any obvious abnormalities.  CT scan of the abdomen pelvis did not show any acute intra-abdominal findings however did show opacities in the lungs.  Due to her immunocompromise status patient was hospitalized.   Assessment & Plan:  Pneumonia due to COVID-19  COVID-19 Labs  Recent Labs    06/02/19 1822 06/03/19 0420  DDIMER 0.50 0.37  FERRITIN  --  2,233*  LDH 150  --   CRP  --  5.0*    Lab Results  Component Value Date   SARSCOV2NAA POSITIVE (A) 06/02/2019   SARSCOV2NAA NOT DETECTED 02/27/2019    Patient seems to be saturating normal on room air.  Continue with remdesivir. Today will be day second of the remdesivir.  Patient noted to have CRP at 5.0.  Ferritin significantly elevated at 2233.  D-dimer 0.37.  She has been continued on her usual home medication regimen of steroids.  She is on prednisone and fludrocortisone.  Could consider increasing the dose of steroids but will hold off for now.  Incentive spirometry, mobilization, out of bed to chair.  Diabetes mellitus type 2, uncontrolled with hyperglycemia Patient presented with blood glucose in the 500 range.  Anion gap was normal.  Patient was given IV fluids.  Initially required insulin drip.  Transitioned to Lantus.  CBGs are better controlled.  Continue to monitor.  May need further adjustment of her Lantus.  HbA1c 11.1 in October implying very poor control of her diabetes.  Acute kidney injury/prior  history of ESRD/status post renal transplant in 2015 Baseline creatinine around 1.1-1.2.  Came in with a creatinine of 1.51.  Patient was hydrated.  Renal function is now normal.  Monitor urine output.  She is on immunosuppressants including tacrolimus, Myfortic, prednisone.  She is also on fludrocortisone.  These are being continued.  Essential hypertension Continue with the amlodipine and carvedilol.  Monitor blood pressures closely.  Hyponatremia Sodium level is stable.  Hyperkalemia Resolved.  Mild transaminitis Resolved.  Most likely due to COVID-19.  DVT prophylaxis: Lovenox Code Status: Full code Family Communication: Discussed with patient and her daughter. Disposition Plan: Discharge when she has completed course of remdesivir.   Consultants:   None  Procedures:   None  Antimicrobials: Others: Anti-infectives (From admission, onward)   Start     Dose/Rate Route Frequency Ordered Stop   06/03/19 1000  remdesivir 100 mg in sodium chloride 0.9 % 100 mL IVPB     100 mg 200 mL/hr over 30 Minutes Intravenous Daily 06/02/19 1031 06/07/19 0959   06/02/19 1045  remdesivir 200 mg in sodium chloride 0.9% 250 mL IVPB     200 mg 580 mL/hr over 30 Minutes Intravenous Once 06/02/19 1031 06/02/19 1240       Subjective: Her daughter was able to interpret.  Patient does speak some [place].  Patient continues to have a dry cough.  Occasional shortness of breath.  No chest pain.  Urinating well.  Objective: Vitals:   06/02/19 1900 06/02/19 2017 06/03/19 0426 06/03/19 0741  BP: 110/75 (!) 145/66 Marland Kitchen)  162/68 (!) 156/61  Pulse: 66 68 74 70  Resp: (!) 25 14 16 11   Temp:  97.9 F (36.6 C) 98.3 F (36.8 C) 98.8 F (37.1 C)  TempSrc:  Oral Oral Oral  SpO2: 94% 98% 98% 94%  Weight:   71.8 kg   Height:   5\' 1"  (1.549 m)     Intake/Output Summary (Last 24 hours) at 06/03/2019 1451 Last data filed at 06/03/2019 1418 Gross per 24 hour  Intake 2623.5 ml  Output 1500 ml  Net  1123.5 ml   Filed Weights   06/03/19 0426  Weight: 71.8 kg    Examination:  General appearance: Awake alert.  In no distress Resp: Normal effort at rest.  Coarse breath sound bilaterally with few crackles at the bases.  No wheezing or rhonchi. Cardio: S1-S2 is normal regular.  No S3-S4.  No rubs murmurs or bruit GI: Abdomen is soft.  Nontender nondistended.  Bowel sounds are present normal.  No masses organomegaly Extremities: No edema.  Full range of motion of lower extremities. Neurologic: Alert and oriented x3.  No focal neurological deficits.      Data Reviewed: I have personally reviewed following labs and imaging studies  CBC: Recent Labs  Lab 06/01/19 1816 06/01/19 2210 06/02/19 0523 06/03/19 0420  WBC 5.7  --  3.9* 3.2*  NEUTROABS  --   --   --  1.9  HGB 14.5 14.6 13.9 12.9  HCT 41.8 43.0 40.7 38.9  MCV 89.7  --  89.3 90.5  PLT 147*  --  142* 096   Basic Metabolic Panel: Recent Labs  Lab 06/01/19 1816 06/01/19 2210 06/02/19 0230 06/02/19 0523 06/03/19 0420  NA 126* 126* 131* 133* 134*  K 4.9 5.7* 5.1 4.4 4.5  CL 93*  --  101 103 104  CO2 22  --  20* 20* 22  GLUCOSE 541*  --  399* 160* 214*  BUN 26*  --  26* 25* 18  CREATININE 1.51*  --  1.30* 1.18* 0.77  CALCIUM 10.0  --  9.4 9.5 9.2  MG  --   --   --   --  1.6*  PHOS  --   --   --   --  1.4*   GFR: Estimated Creatinine Clearance: 76.2 mL/min (by C-G formula based on SCr of 0.77 mg/dL). Liver Function Tests: Recent Labs  Lab 06/01/19 1816 06/03/19 0420  AST 46* 30  ALT 51* 39  ALKPHOS 84 63  BILITOT 0.9 0.7  PROT 6.7 6.1*  ALBUMIN 3.6 3.3*   Recent Labs  Lab 06/01/19 1816  LIPASE 17   CBG: Recent Labs  Lab 06/02/19 1214 06/02/19 1551 06/02/19 1756 06/03/19 0740 06/03/19 1125  GLUCAP 275* 268* 297* 203* 247*   Anemia Panel: Recent Labs    06/03/19 0420  FERRITIN 2,233*     Recent Results (from the past 240 hour(s))  Urine culture     Status: Abnormal (Preliminary  result)   Collection Time: 06/01/19  8:40 PM   Specimen: Urine, Random  Result Value Ref Range Status   Specimen Description URINE, RANDOM  Final   Special Requests   Final    NONE Performed at [facility], [place] [address] [place], [place] [address]    Culture 20,000 COLONIES/mL ENTEROCOCCUS FAECALIS (A)  Final   Report Status PENDING  Incomplete  SARS CORONAVIRUS 2 (TAT 6-24 HRS) Nasopharyngeal Nasopharyngeal Swab     Status: Abnormal   Collection Time: 06/02/19  12:18 AM   Specimen: Nasopharyngeal Swab  Result Value Ref Range Status   SARS Coronavirus 2 POSITIVE (A) NEGATIVE Final    Comment: RESULT CALLED TO, READ BACK BY AND VERIFIED WITH: Jaidan ROBERTSON RN.@[email or web address]  ON 12.8.2020 BY TCALDWELL MT. (NOTE) SARS-CoV-2 target nucleic acids are DETECTED. The SARS-CoV-2 RNA is generally detectable in upper and lower respiratory specimens during the acute phase of infection. Positive results are indicative of the presence of SARS-CoV-2 RNA. Clinical correlation with patient history and other diagnostic information is  necessary to determine patient infection status. Positive results do not rule out bacterial infection or co-infection with other viruses.  The expected result is Negative. Fact Sheet for Patients: SugarRoll.be Fact Sheet for Healthcare Providers: https://www.woods-mathews.com/ This test is not yet approved or cleared by the [place] FDA and  has been authorized for detection and/or diagnosis of SARS-CoV-2 by FDA under an Emergency Use Authorization (EUA). This EUA will remain  in effect (meaning this te st can be used) for the duration of the COVID-19 declaration under Section 564(b)(1) of the Act, 21 U.S.C. section 360bbb-3(b)(1), unless the authorization is terminated or revoked sooner. Performed at [facility], [place] [address], [place], [place] [address]          Radiology Studies: Ct Abdomen  Pelvis Wo Contrast  Result Date: 06/01/2019 CLINICAL DATA:  Nausea and vomiting, COVID-19 positivity EXAM: CT ABDOMEN AND PELVIS WITHOUT CONTRAST TECHNIQUE: Multidetector CT imaging of the abdomen and pelvis was performed following the standard protocol without IV contrast. COMPARISON:  None. FINDINGS: Lower chest: Patchy ground-glass opacities are noted in the bases bilaterally consistent with the given clinical history of COVID-19 positivity. Scattered areas of the reverse halo sign are noted as well. Hepatobiliary: No focal liver abnormality is seen. Status post cholecystectomy. No biliary dilatation. Pancreas: Unremarkable. No pancreatic ductal dilatation or surrounding inflammatory changes. Spleen: Normal in size without focal abnormality. Adrenals/Urinary Tract: Adrenal glands are within normal limits. The native kidneys are shrunken consistent with the known history of end-stage renal disease. Renal transplant is noted in the right lower quadrant. No obstructive changes are seen. No Perirenal inflammatory changes are seen. The bladder is decompressed. Stomach/Bowel: Scattered diverticular change of the colon is noted. The appendix is within normal limits. No small bowel or gastric abnormality is seen. Vascular/Lymphatic: Aortic atherosclerosis. No enlarged abdominal or pelvic lymph nodes. Reproductive: Status post hysterectomy. No adnexal masses. Other: No abdominal wall hernia or abnormality. No abdominopelvic ascites. Musculoskeletal: No acute or significant osseous findings. IMPRESSION: Changes in the lung bases consistent with the known history of COVID-19 positivity. Changes consistent with end-stage renal disease and right lower quadrant renal transplant. No acute abnormality noted. Diverticulosis without diverticulitis. Electronically Signed   By: Inez Catalina M.D.   On: 06/01/2019 23:13   Dg Chest Portable 1 View  Result Date: 06/01/2019 CLINICAL DATA:  Cough.  COVID 19 positive EXAM: PORTABLE  CHEST 1 VIEW COMPARISON:  01/04/2015 FINDINGS: The heart size and mediastinal contours are within normal limits. Both lungs are clear. The visualized skeletal structures are unremarkable. IMPRESSION: Clear lungs. Electronically Signed   By: Ulyses Jarred M.D.   On: 06/01/2019 22:13      Scheduled Meds: . amLODipine  10 mg Oral Daily  . aspirin EC  81 mg Oral Daily  . carvedilol  12.5 mg Oral BID WC  . docusate sodium  100 mg Oral BID  . enoxaparin (LOVENOX) injection  40 mg Subcutaneous Daily  . Tacrolimus ER  3 mg Oral QAC breakfast  . fludrocortisone  0.1 mg Oral Daily  . FLUoxetine  40 mg Oral Daily  . fluticasone  1 spray Each Nare Daily  . gabapentin  300 mg Oral QHS  . insulin aspart  0-5 Units Subcutaneous QHS  . insulin aspart  0-9 Units Subcutaneous TID WC  . insulin aspart  3 Units Subcutaneous TID WC  . insulin glargine  25 Units Subcutaneous Daily  . loratadine  10 mg Oral Daily  . magnesium oxide  400 mg Oral BID  . mycophenolate  360 mg Oral BID  . pantoprazole  40 mg Oral Daily  . predniSONE  5 mg Oral Q breakfast  . rosuvastatin  5 mg Oral QHS   Continuous Infusions: . sodium chloride 100 mL/hr at 06/03/19 0438  . remdesivir 100 mg in NS 100 mL 100 mg (06/03/19 0945)     LOS: 1 day      Bonnielee Haff, MD [facility]  Pager: [email or web address]  06/03/2019, 2:51 PM

## 2019-06-03 NOTE — Progress Notes (Signed)
Pt transferred to 313 report given to [facility] with understanding, pt transferred via w/c. Uneventful shift while in rm#[id number], denies pain or discomfort prior to transferring, 2nd dose Remdesivir given while on PCU IVF infusing as ordered, restricted LUE d/t AV fistula, no s/s of hypo/hyperglycemia standing and s/s insulin given as ordered.  Co-op with care.

## 2019-06-03 NOTE — Progress Notes (Signed)
Pt and writer counted home medication Envarsus XR 1 mg 87tabs signed paperwork for pharmacy to label for use and gave to charge nurse [name] RN to bring to pharmacy.

## 2019-06-04 LAB — CBC WITH DIFFERENTIAL/PLATELET
Abs Immature Granulocytes: 0.03 10*3/uL (ref 0.00–0.07)
Basophils Absolute: 0 10*3/uL (ref 0.0–0.1)
Basophils Relative: 1 %
Eosinophils Absolute: 0 10*3/uL (ref 0.0–0.5)
Eosinophils Relative: 0 %
HCT: 38.9 % (ref 36.0–46.0)
Hemoglobin: 13.6 g/dL (ref 12.0–15.0)
Immature Granulocytes: 1 %
Lymphocytes Relative: 33 %
Lymphs Abs: 1.4 10*3/uL (ref 0.7–4.0)
MCH: 31.3 pg (ref 26.0–34.0)
MCHC: 35 g/dL (ref 30.0–36.0)
MCV: 89.4 fL (ref 80.0–100.0)
Monocytes Absolute: 0.5 10*3/uL (ref 0.1–1.0)
Monocytes Relative: 13 %
Neutro Abs: 2.3 10*3/uL (ref 1.7–7.7)
Neutrophils Relative %: 52 %
Platelets: 172 10*3/uL (ref 150–400)
RBC: 4.35 MIL/uL (ref 3.87–5.11)
RDW: 11.9 % (ref 11.5–15.5)
WBC: 4.2 10*3/uL (ref 4.0–10.5)
nRBC: 0 % (ref 0.0–0.2)

## 2019-06-04 LAB — GLUCOSE, CAPILLARY
Glucose-Capillary: 238 mg/dL — ABNORMAL HIGH (ref 70–99)
Glucose-Capillary: 182 mg/dL — ABNORMAL HIGH (ref 70–99)
Glucose-Capillary: 189 mg/dL — ABNORMAL HIGH (ref 70–99)
Glucose-Capillary: 121 mg/dL — ABNORMAL HIGH (ref 70–99)
Glucose-Capillary: 70 mg/dL (ref 70–99)
Glucose-Capillary: 166 mg/dL — ABNORMAL HIGH (ref 70–99)

## 2019-06-04 LAB — COMPREHENSIVE METABOLIC PANEL
ALT: 35 U/L (ref 0–44)
AST: 29 U/L (ref 15–41)
Albumin: 3.6 g/dL (ref 3.5–5.0)
Alkaline Phosphatase: 68 U/L (ref 38–126)
Anion gap: 9 (ref 5–15)
BUN: 18 mg/dL (ref 6–20)
CO2: 24 mmol/L (ref 22–32)
Calcium: 9.6 mg/dL (ref 8.9–10.3)
Chloride: 103 mmol/L (ref 98–111)
Creatinine, Ser: 0.84 mg/dL (ref 0.44–1.00)
GFR calc Af Amer: >60 mL/min (ref >60)
GFR calc non Af Amer: >60 mL/min (ref >60)
Glucose, Bld: 228 mg/dL — ABNORMAL HIGH (ref 70–99)
Potassium: 4.3 mmol/L (ref 3.5–5.1)
Sodium: 136 mmol/L (ref 135–145)
Total Bilirubin: 0.8 mg/dL (ref 0.3–1.2)
Total Protein: 6.5 g/dL (ref 6.5–8.1)

## 2019-06-04 LAB — FERRITIN: Ferritin: 1979 ng/mL — ABNORMAL HIGH (ref 11–307)

## 2019-06-04 LAB — C-REACTIVE PROTEIN: CRP: 5.1 mg/dL — ABNORMAL HIGH (ref <1.0)

## 2019-06-04 LAB — URINE CULTURE: Culture: 20000 — AB

## 2019-06-04 LAB — MAGNESIUM: Magnesium: 1.5 mg/dL — ABNORMAL LOW (ref 1.7–2.4)

## 2019-06-04 LAB — D-DIMER, QUANTITATIVE: D-Dimer, Quant: 0.37 ug/mL-FEU (ref 0.00–0.50)

## 2019-06-04 MED ORDER — ONDANSETRON HCL 4 MG/2ML IJ SOLN
4.0000 mg | Freq: Four times a day (QID) | INTRAMUSCULAR | Status: DC | PRN
  Administered 2019-06-04: 4 mg via INTRAVENOUS
  Filled 2019-06-04: qty 2

## 2019-06-04 MED ORDER — MAGNESIUM SULFATE 2 GM/50ML IV SOLN
2.0000 g | Freq: Once | INTRAVENOUS | Status: AC
  Administered 2019-06-04: 2 g via INTRAVENOUS
  Filled 2019-06-04: qty 50

## 2019-06-04 NOTE — Progress Notes (Signed)
   06/04/19 1142  Unmeasured Output  Urine Occurrence 1  Noticed MD [place] Pink requested Zofran

## 2019-06-04 NOTE — Progress Notes (Signed)
PROGRESS NOTE    Cynthia [name]  JEH:[id number] DOB: 1970-10-19 DOA: 06/01/2019 PCP: Katherina Mires, MD   Brief Narrative: 48 year old with past medical history significant for anxiety, hypertension, hyperlipidemia, diabetes type 2, anemia history of end-stage renal disease previously on dialysis status post transplant in 2015 at [facility], recently diagnosed with COVID-19 who presented with fatigue, sleepiness.  She reported cough and shortness of breath.  She also was experiencing abdominal pain.  Chest x-ray did not show any obvious abnormalities.  CT scan of the abdomen pelvis did not show any acute intra-abdominal findings however did show opacities in the lungs.  Due to her immunocompromise status patient was hospitalized.   Assessment & Plan:  Pneumonia due to COVID-19  COVID-19 Labs  Recent Labs    06/02/19 1822 06/03/19 0420 06/04/19 0520  DDIMER 0.50 0.37 0.37  FERRITIN  --  2,233* 1,979*  LDH 150  --   --   CRP  --  5.0* 5.1*    Lab Results  Component Value Date   SARSCOV2NAA POSITIVE (A) 06/02/2019   SARSCOV2NAA NOT DETECTED 02/27/2019   Patient continues to build from a respiratory standpoint.  She is saturating normal on room air.  Continue with remdesivir.  Today will be the third dose.  CRP is stable at 5.1.  Ferritin improved to 1979.  D-dimer is normal.  Continue incentive spirometry and mobilization.  Continue her home dose of prednisone and fludrocortisone.  Vomiting Patient had one episode of vomiting this morning.  Reason is not entirely clear.  Could be medication induced.  No reports of abdominal pain.  Antiemetics.  Continue to monitor.  Diabetes mellitus type 2, uncontrolled with hyperglycemia Patient presented with blood glucose in the 500 range.  Anion gap was normal.  Patient was given IV fluids.  Initially required insulin drip.  Transitioned to Lantus.  CBGs are reasonably well controlled.  Continue to monitor. HbA1c 11.1 in October implying very poor  control of her diabetes.  Acute kidney injury/prior history of ESRD/status post renal transplant in 2015 Baseline creatinine around 1.1-1.2.  Came in with a creatinine of 1.51.  Patient was hydrated.  Renal function is now normal and stable.  Magnesium to be repleted.  She is on immunosuppressants including tacrolimus, Myfortic, prednisone.  She is also on fludrocortisone.  These are being continued.  Essential hypertension Continue with the amlodipine and carvedilol.  Blood pressure is reasonably well controlled.  Hyponatremia Sodium level is stable.  Hyperkalemia Resolved.  Mild transaminitis Resolved.  Most likely due to COVID-19.  DVT prophylaxis: Lovenox Code Status: Full code Family Communication: We will call her daughter later today.  Discussed with patient. Disposition Plan: Discharge when she has completed course of remdesivir.   Consultants:   None  Procedures:   None  Antimicrobials: Others: Anti-infectives (From admission, onward)   Start     Dose/Rate Route Frequency Ordered Stop   06/03/19 1000  remdesivir 100 mg in sodium chloride 0.9 % 100 mL IVPB     100 mg 200 mL/hr over 30 Minutes Intravenous Daily 06/02/19 1031 06/07/19 0959   06/02/19 1045  remdesivir 200 mg in sodium chloride 0.9% 250 mL IVPB     200 mg 580 mL/hr over 30 Minutes Intravenous Once 06/02/19 1031 06/02/19 1240       Subjective: Patient denies any complaints this morning.  No shortness of breath.  Continues to have a dry cough.  No chest pain.  Objective: Vitals:   06/03/19 1658 06/03/19 2029 06/04/19  0520 06/04/19 0755  BP: (!) 158/58 (!) 142/60 136/60 139/65  Pulse: 72 68 68 70  Resp:  18 15 16   Temp:  98.2 F (36.8 C) 98.4 F (36.9 C) 98.4 F (36.9 C)  TempSrc:  Oral Oral Oral  SpO2:  98% 95% 96%  Weight:      Height:        Intake/Output Summary (Last 24 hours) at 06/04/2019 1332 Last data filed at 06/03/2019 2125 Gross per 24 hour  Intake 1218.09 ml  Output -   Net 1218.09 ml   Filed Weights   06/03/19 0426  Weight: 71.8 kg    Examination:  General appearance: Awake alert.  In no distress Resp: Normal effort at rest.  Good air entry bilaterally with a few crackles at the bases.  No wheezing or rhonchi.   Cardio: S1-S2 is normal regular.  No S3-S4.  No rubs murmurs or bruit GI: Abdomen is soft.  Nontender nondistended.  Bowel sounds are present normal.  No masses organomegaly Extremities: No edema.  Full range of motion of lower extremities. Neurologic: Alert and oriented x3.  No focal neurological deficits.      Data Reviewed: I have personally reviewed following labs and imaging studies  CBC: Recent Labs  Lab 06/01/19 1816 06/01/19 2210 06/02/19 0523 06/03/19 0420 06/04/19 0520  WBC 5.7  --  3.9* 3.2* 4.2  NEUTROABS  --   --   --  1.9 2.3  HGB 14.5 14.6 13.9 12.9 13.6  HCT 41.8 43.0 40.7 38.9 38.9  MCV 89.7  --  89.3 90.5 89.4  PLT 147*  --  142* 155 097   Basic Metabolic Panel: Recent Labs  Lab 06/01/19 1816 06/01/19 2210 06/02/19 0230 06/02/19 0523 06/03/19 0420 06/04/19 0520  NA 126* 126* 131* 133* 134* 136  K 4.9 5.7* 5.1 4.4 4.5 4.3  CL 93*  --  101 103 104 103  CO2 22  --  20* 20* 22 24  GLUCOSE 541*  --  399* 160* 214* 228*  BUN 26*  --  26* 25* 18 18  CREATININE 1.51*  --  1.30* 1.18* 0.77 0.84  CALCIUM 10.0  --  9.4 9.5 9.2 9.6  MG  --   --   --   --  1.6* 1.5*  PHOS  --   --   --   --  1.4*  --    GFR: Estimated Creatinine Clearance: 72.6 mL/min (by C-G formula based on SCr of 0.84 mg/dL). Liver Function Tests: Recent Labs  Lab 06/01/19 1816 06/03/19 0420 06/04/19 0520  AST 46* 30 29  ALT 51* 39 35  ALKPHOS 84 63 68  BILITOT 0.9 0.7 0.8  PROT 6.7 6.1* 6.5  ALBUMIN 3.6 3.3* 3.6   Recent Labs  Lab 06/01/19 1816  LIPASE 17   CBG: Recent Labs  Lab 06/03/19 1125 06/03/19 1647 06/03/19 2137 06/04/19 0757 06/04/19 1135  GLUCAP 247* 323* 209* 182* 189*   Anemia Panel: Recent Labs     06/03/19 0420 06/04/19 0520  FERRITIN 2,233* 1,979*     Recent Results (from the past 240 hour(s))  Urine culture     Status: Abnormal   Collection Time: 06/01/19  8:40 PM   Specimen: Urine, Random  Result Value Ref Range Status   Specimen Description URINE, RANDOM  Final   Special Requests   Final    NONE Performed at [facility], [place] [address] [place],  [address]  Culture 20,000 COLONIES/mL ENTEROCOCCUS FAECALIS (A)  Final   Report Status 06/04/2019 FINAL  Final   Organism ID, Bacteria ENTEROCOCCUS FAECALIS (A)  Final      Susceptibility   Enterococcus faecalis - MIC*    AMPICILLIN <=2 SENSITIVE Sensitive     NITROFURANTOIN <=16 SENSITIVE Sensitive     VANCOMYCIN 1 SENSITIVE Sensitive     * 20,000 COLONIES/mL ENTEROCOCCUS FAECALIS  SARS CORONAVIRUS 2 (TAT 6-24 HRS) Nasopharyngeal Nasopharyngeal Swab     Status: Abnormal   Collection Time: 06/02/19 12:18 AM   Specimen: Nasopharyngeal Swab  Result Value Ref Range Status   SARS Coronavirus 2 POSITIVE (A) NEGATIVE Final    Comment: RESULT CALLED TO, READ BACK BY AND VERIFIED WITH: Gabriell ROBERTSON RN.@[email or web address]  ON 12.8.2020 BY TCALDWELL MT. (NOTE) SARS-CoV-2 target nucleic acids are DETECTED. The SARS-CoV-2 RNA is generally detectable in upper and lower respiratory specimens during the acute phase of infection. Positive results are indicative of the presence of SARS-CoV-2 RNA. Clinical correlation with patient history and other diagnostic information is  necessary to determine patient infection status. Positive results do not rule out bacterial infection or co-infection with other viruses.  The expected result is Negative. Fact Sheet for Patients: SugarRoll.be Fact Sheet for Healthcare Providers: https://www.woods-mathews.com/ This test is not yet approved or cleared by the [place] FDA and  has been authorized for detection and/or diagnosis of SARS-CoV-2 by  FDA under an Emergency Use Authorization (EUA). This EUA will remain  in effect (meaning this te st can be used) for the duration of the COVID-19 declaration under Section 564(b)(1) of the Act, 21 U.S.C. section 360bbb-3(b)(1), unless the authorization is terminated or revoked sooner. Performed at [facility], [place] [address], [place], [place] [address]          Radiology Studies: No results found.    Scheduled Meds: . amLODipine  10 mg Oral Daily  . aspirin EC  81 mg Oral Daily  . carvedilol  12.5 mg Oral BID WC  . docusate sodium  100 mg Oral BID  . enoxaparin (LOVENOX) injection  40 mg Subcutaneous Daily  . Tacrolimus ER  3 mg Oral QAC breakfast  . fludrocortisone  0.1 mg Oral Daily  . FLUoxetine  40 mg Oral Daily  . fluticasone  1 spray Each Nare Daily  . gabapentin  300 mg Oral QHS  . insulin aspart  0-5 Units Subcutaneous QHS  . insulin aspart  0-9 Units Subcutaneous TID WC  . insulin aspart  3 Units Subcutaneous TID WC  . insulin glargine  30 Units Subcutaneous Daily  . loratadine  10 mg Oral Daily  . magnesium oxide  400 mg Oral BID  . mycophenolate  360 mg Oral BID  . pantoprazole  40 mg Oral Daily  . predniSONE  5 mg Oral Q breakfast  . rosuvastatin  5 mg Oral QHS   Continuous Infusions: . magnesium sulfate bolus IVPB 2 g (06/04/19 1303)  . remdesivir 100 mg in NS 100 mL 100 mg (06/04/19 0906)     LOS: 2 days      Bonnielee Haff, MD [facility]  Pager: [email or web address]  06/04/2019, 1:32 PM

## 2019-06-04 NOTE — Progress Notes (Signed)
Inpatient Diabetes Program Recommendations  AACE/ADA: New Consensus Statement on Inpatient Glycemic Control (2015)  Target Ranges:  Prepandial:   less than 140 mg/dL      Peak postprandial:   less than 180 mg/dL (1-2 hours)      Critically ill patients:  140 - 180 mg/dL   Lab Results  Component Value Date   GLUCAP 189 (H) 06/04/2019   HGBA1C 11.1 (A) 04/14/2019    Review of Glycemic Control Results for Cynthia [name], Cynthia [name] (MRN [id number]) as of 06/04/2019 16:24  Ref. Range 06/03/2019 16:47 06/03/2019 21:37 06/04/2019 07:57 06/04/2019 11:35  Glucose-Capillary Latest Ref Range: 70 - 99 mg/dL 323 (H) 209 (H) 182 (H) 189 (H)    Having difficulty reaching patient. Attempted to speak with patient x4. Discussed with RN and called cell provided by patient, no answer. Will attempt at a later time.   Thanks, Bronson Curb, MSN, RNC-OB Diabetes Coordinator [phone] (8a-5p)

## 2019-06-05 LAB — CBC WITH DIFFERENTIAL/PLATELET
Abs Immature Granulocytes: 0.03 10*3/uL (ref 0.00–0.07)
Basophils Absolute: 0 10*3/uL (ref 0.0–0.1)
Basophils Relative: 0 %
Eosinophils Absolute: 0 10*3/uL (ref 0.0–0.5)
Eosinophils Relative: 0 %
HCT: 38.9 % (ref 36.0–46.0)
Hemoglobin: 13 g/dL (ref 12.0–15.0)
Immature Granulocytes: 1 %
Lymphocytes Relative: 29 %
Lymphs Abs: 1.3 10*3/uL (ref 0.7–4.0)
MCH: 30.4 pg (ref 26.0–34.0)
MCHC: 33.4 g/dL (ref 30.0–36.0)
MCV: 90.9 fL (ref 80.0–100.0)
Monocytes Absolute: 0.6 10*3/uL (ref 0.1–1.0)
Monocytes Relative: 13 %
Neutro Abs: 2.7 10*3/uL (ref 1.7–7.7)
Neutrophils Relative %: 57 %
Platelets: 184 10*3/uL (ref 150–400)
RBC: 4.28 MIL/uL (ref 3.87–5.11)
RDW: 12.1 % (ref 11.5–15.5)
WBC: 4.7 10*3/uL (ref 4.0–10.5)
nRBC: 0 % (ref 0.0–0.2)

## 2019-06-05 LAB — COMPREHENSIVE METABOLIC PANEL
ALT: 41 U/L (ref 0–44)
AST: 34 U/L (ref 15–41)
Albumin: 3.3 g/dL — ABNORMAL LOW (ref 3.5–5.0)
Alkaline Phosphatase: 59 U/L (ref 38–126)
Anion gap: 8 (ref 5–15)
BUN: 21 mg/dL — ABNORMAL HIGH (ref 6–20)
CO2: 26 mmol/L (ref 22–32)
Calcium: 9.4 mg/dL (ref 8.9–10.3)
Chloride: 101 mmol/L (ref 98–111)
Creatinine, Ser: 0.93 mg/dL (ref 0.44–1.00)
GFR calc Af Amer: >60 mL/min (ref >60)
GFR calc non Af Amer: >60 mL/min (ref >60)
Glucose, Bld: 117 mg/dL — ABNORMAL HIGH (ref 70–99)
Potassium: 4.4 mmol/L (ref 3.5–5.1)
Sodium: 135 mmol/L (ref 135–145)
Total Bilirubin: 0.7 mg/dL (ref 0.3–1.2)
Total Protein: 6 g/dL — ABNORMAL LOW (ref 6.5–8.1)

## 2019-06-05 LAB — GLUCOSE, CAPILLARY
Glucose-Capillary: 55 mg/dL — ABNORMAL LOW (ref 70–99)
Glucose-Capillary: 92 mg/dL (ref 70–99)
Glucose-Capillary: 209 mg/dL — ABNORMAL HIGH (ref 70–99)
Glucose-Capillary: 290 mg/dL — ABNORMAL HIGH (ref 70–99)

## 2019-06-05 LAB — MAGNESIUM: Magnesium: 1.8 mg/dL (ref 1.7–2.4)

## 2019-06-05 LAB — D-DIMER, QUANTITATIVE: D-Dimer, Quant: 0.31 ug/mL-FEU (ref 0.00–0.50)

## 2019-06-05 LAB — C-REACTIVE PROTEIN: CRP: 6.5 mg/dL — ABNORMAL HIGH (ref <1.0)

## 2019-06-05 LAB — FERRITIN: Ferritin: 1855 ng/mL — ABNORMAL HIGH (ref 11–307)

## 2019-06-05 MED ORDER — INSULIN ASPART 100 UNIT/ML ~~LOC~~ SOLN
3.0000 [IU] | Freq: Two times a day (BID) | SUBCUTANEOUS | Status: DC
  Administered 2019-06-06 (×2): 3 [IU] via SUBCUTANEOUS

## 2019-06-05 NOTE — Progress Notes (Signed)
  SATURATION QUALIFICATIONS: (This note is used to comply with regulatory documentation for home oxygen)  Patient Saturations on Room Air at Rest = 98%  Patient Saturations on Room Air while Ambulating =97%  Patient Saturations on 0 Liters of oxygen while Ambulating = 97%  Please briefly explain why patient needs home oxygen:

## 2019-06-05 NOTE — Progress Notes (Signed)
0755 CBG 55 Provided 8oz oj, rechecked 0823 CBG 92.

## 2019-06-05 NOTE — Progress Notes (Signed)
PROGRESS NOTE    Cynthia [name]  WOE:[id number] DOB: 1970/10/14 DOA: 06/01/2019 PCP: Katherina Mires, MD   Brief Narrative: 48 year old with past medical history significant for anxiety, hypertension, hyperlipidemia, diabetes type 2, anemia history of end-stage renal disease previously on dialysis status post transplant in 2015 at [facility], recently diagnosed with COVID-19 who presented with fatigue, sleepiness.  She reported cough and shortness of breath.  She also was experiencing abdominal pain.  Chest x-ray did not show any obvious abnormalities.  CT scan of the abdomen pelvis did not show any acute intra-abdominal findings however did show opacities in the lungs.  Due to her immunocompromise status patient was hospitalized.   Assessment & Plan:  Pneumonia due to COVID-19  COVID-19 Labs  Recent Labs    06/02/19 1822 06/03/19 0420 06/04/19 0520 06/05/19 0500  DDIMER 0.50 0.37 0.37 0.31  FERRITIN  --  2,233* 1,979* 1,855*  LDH 150  --   --   --   CRP  --  5.0* 5.1* 6.5*    Lab Results  Component Value Date   SARSCOV2NAA POSITIVE (A) 06/02/2019   SARSCOV2NAA NOT DETECTED 02/27/2019   Patient continues to be stable from a respiratory standpoint.  She continues to saturate normal on room air.  CRP noted to be 6.5 today.  Ferritin 1855.  Patient feels better.  Today will be the fourth dose of remdesivir.  Continue incentive spirometry and mobilization.  Continue her home dose of prednisone and fludrocortisone.  Patient complains of body aches today which is most likely due to COVID-19.  Vomiting Patient had one episode of vomiting on 12/10.  No further episodes.  Abdomen is benign.  Could be medication induced.  Continue to monitor.    Diabetes mellitus type 2, uncontrolled with hyperglycemia Patient presented with blood glucose in the 500 range.  Anion gap was normal.  Patient was given IV fluids.  Initially required insulin drip.  Transitioned to Lantus.  HbA1c 11.1 in October.    CBGs have been reasonably well controlled.  Noted to have hypoglycemia this morning.   We will stop the nighttime meal coverage.    Acute kidney injury/prior history of ESRD/status post renal transplant in 2015 Baseline creatinine around 1.1-1.2.  Came in with a creatinine of 1.51.  Patient was hydrated.  Renal function is now normal and stable.  Magnesium was repleted.   She is on immunosuppressants including tacrolimus, Myfortic, prednisone.  She is also on fludrocortisone.  These are being continued.  Essential hypertension Continue with the amlodipine and carvedilol.  Blood pressure is reasonably well controlled.  Hyponatremia Sodium is normal this morning.  Hyperkalemia Resolved.  Mild transaminitis Resolved.  Most likely due to COVID-19.  DVT prophylaxis: Lovenox Code Status: Full code Family Communication: Discussed with patient.  Discussed with daughter yesterday.  Will call again today. Disposition Plan: Discharge when she has completed course of remdesivir.   Consultants:   None  Procedures:   None  Antimicrobials: Others: Anti-infectives (From admission, onward)   Start     Dose/Rate Route Frequency Ordered Stop   06/03/19 1000  remdesivir 100 mg in sodium chloride 0.9 % 100 mL IVPB     100 mg 200 mL/hr over 30 Minutes Intravenous Daily 06/02/19 1031 06/07/19 0959   06/02/19 1045  remdesivir 200 mg in sodium chloride 0.9% 250 mL IVPB     200 mg 580 mL/hr over 30 Minutes Intravenous Once 06/02/19 1031 06/02/19 1240       Subjective: Patient complains  of body aches today.  In the shoulder area as well as mild headache.  No further episodes of vomiting.  Denies any nausea.  Denies abdominal pain.  Objective: Vitals:   06/04/19 1619 06/04/19 1944 06/05/19 0500 06/05/19 0825  BP: 138/60 (!) 114/45 (!) 135/54 (!) 117/57  Pulse: 64 64 72 72  Resp: 17 17 17    Temp: 98 F (36.7 C) 97.9 F (36.6 C) 98.6 F (37 C) 98.2 F (36.8 C)  TempSrc: Oral Oral Oral  Oral  SpO2: 94% 95% 95% 96%  Weight:      Height:        Intake/Output Summary (Last 24 hours) at 06/05/2019 1317 Last data filed at 06/05/2019 1300 Gross per 24 hour  Intake 641.42 ml  Output --  Net 641.42 ml   Filed Weights   06/03/19 0426  Weight: 71.8 kg    Examination:  General appearance: Awake alert.  In no distress Resp: Normal effort at rest.  Coarse breath sound bilaterally.  No wheezing rales or rhonchi. Cardio: S1-S2 is normal regular.  No S3-S4.  No rubs murmurs or bruit GI: Abdomen is soft.  Nontender nondistended.  Bowel sounds are present normal.  No masses organomegaly Extremities: No edema.  Full range of motion of lower extremities. Neurologic: Alert and oriented x3.  No focal neurological deficits.       Data Reviewed: I have personally reviewed following labs and imaging studies  CBC: Recent Labs  Lab 06/01/19 1816 06/01/19 2210 06/02/19 0523 06/03/19 0420 06/04/19 0520 06/05/19 0500  WBC 5.7  --  3.9* 3.2* 4.2 4.7  NEUTROABS  --   --   --  1.9 2.3 2.7  HGB 14.5 14.6 13.9 12.9 13.6 13.0  HCT 41.8 43.0 40.7 38.9 38.9 38.9  MCV 89.7  --  89.3 90.5 89.4 90.9  PLT 147*  --  142* 155 172 314   Basic Metabolic Panel: Recent Labs  Lab 06/02/19 0230 06/02/19 0523 06/03/19 0420 06/04/19 0520 06/05/19 0500  NA 131* 133* 134* 136 135  K 5.1 4.4 4.5 4.3 4.4  CL 101 103 104 103 101  CO2 20* 20* 22 24 26   GLUCOSE 399* 160* 214* 228* 117*  BUN 26* 25* 18 18 21*  CREATININE 1.30* 1.18* 0.77 0.84 0.93  CALCIUM 9.4 9.5 9.2 9.6 9.4  MG  --   --  1.6* 1.5* 1.8  PHOS  --   --  1.4*  --   --    GFR: Estimated Creatinine Clearance: 65.6 mL/min (by C-G formula based on SCr of 0.93 mg/dL). Liver Function Tests: Recent Labs  Lab 06/01/19 1816 06/03/19 0420 06/04/19 0520 06/05/19 0500  AST 46* 30 29 34  ALT 51* 39 35 41  ALKPHOS 84 63 68 59  BILITOT 0.9 0.7 0.8 0.7  PROT 6.7 6.1* 6.5 6.0*  ALBUMIN 3.6 3.3* 3.6 3.3*   Recent Labs  Lab  06/01/19 1816  LIPASE 17   CBG: Recent Labs  Lab 06/04/19 1943 06/04/19 2120 06/05/19 0752 06/05/19 0823 06/05/19 1152  GLUCAP 70 166* 55* 92 209*   Anemia Panel: Recent Labs    06/04/19 0520 06/05/19 0500  FERRITIN 1,979* 1,855*     Recent Results (from the past 240 hour(s))  Urine culture     Status: Abnormal   Collection Time: 06/01/19  8:40 PM   Specimen: Urine, Random  Result Value Ref Range Status   Specimen Description URINE, RANDOM  Final   Special Requests  Final    NONE Performed at [facility], [place] [address], [place], [place] [address]    Culture 20,000 COLONIES/mL ENTEROCOCCUS FAECALIS (A)  Final   Report Status 06/04/2019 FINAL  Final   Organism ID, Bacteria ENTEROCOCCUS FAECALIS (A)  Final      Susceptibility   Enterococcus faecalis - MIC*    AMPICILLIN <=2 SENSITIVE Sensitive     NITROFURANTOIN <=16 SENSITIVE Sensitive     VANCOMYCIN 1 SENSITIVE Sensitive     * 20,000 COLONIES/mL ENTEROCOCCUS FAECALIS  SARS CORONAVIRUS 2 (TAT 6-24 HRS) Nasopharyngeal Nasopharyngeal Swab     Status: Abnormal   Collection Time: 06/02/19 12:18 AM   Specimen: Nasopharyngeal Swab  Result Value Ref Range Status   SARS Coronavirus 2 POSITIVE (A) NEGATIVE Final    Comment: RESULT CALLED TO, READ BACK BY AND VERIFIED WITH: Ortencia ROBERTSON RN.@[email or web address]  ON 12.8.2020 BY TCALDWELL MT. (NOTE) SARS-CoV-2 target nucleic acids are DETECTED. The SARS-CoV-2 RNA is generally detectable in upper and lower respiratory specimens during the acute phase of infection. Positive results are indicative of the presence of SARS-CoV-2 RNA. Clinical correlation with patient history and other diagnostic information is  necessary to determine patient infection status. Positive results do not rule out bacterial infection or co-infection with other viruses.  The expected result is Negative. Fact Sheet for Patients: SugarRoll.be Fact Sheet for Healthcare  Providers: https://www.woods-mathews.com/ This test is not yet approved or cleared by the [place] FDA and  has been authorized for detection and/or diagnosis of SARS-CoV-2 by FDA under an Emergency Use Authorization (EUA). This EUA will remain  in effect (meaning this te st can be used) for the duration of the COVID-19 declaration under Section 564(b)(1) of the Act, 21 U.S.C. section 360bbb-3(b)(1), unless the authorization is terminated or revoked sooner. Performed at [facility], [place] [address] [place], [place] [address]          Radiology Studies: No results found.    Scheduled Meds: . amLODipine  10 mg Oral Daily  . aspirin EC  81 mg Oral Daily  . carvedilol  12.5 mg Oral BID WC  . docusate sodium  100 mg Oral BID  . enoxaparin (LOVENOX) injection  40 mg Subcutaneous Daily  . Tacrolimus ER  3 mg Oral QAC breakfast  . fludrocortisone  0.1 mg Oral Daily  . FLUoxetine  40 mg Oral Daily  . fluticasone  1 spray Each Nare Daily  . gabapentin  300 mg Oral QHS  . insulin aspart  0-5 Units Subcutaneous QHS  . insulin aspart  0-9 Units Subcutaneous TID WC  . insulin aspart  3 Units Subcutaneous TID WC  . insulin glargine  30 Units Subcutaneous Daily  . loratadine  10 mg Oral Daily  . magnesium oxide  400 mg Oral BID  . mycophenolate  360 mg Oral BID  . pantoprazole  40 mg Oral Daily  . predniSONE  5 mg Oral Q breakfast  . rosuvastatin  5 mg Oral QHS   Continuous Infusions: . remdesivir 100 mg in NS 100 mL 100 mg (06/05/19 1056)     LOS: 3 days      Bonnielee Haff, MD [facility]  Pager: [email or web address]  06/05/2019, 1:17 PM

## 2019-06-06 DIAGNOSIS — Z794 Long term (current) use of insulin: Secondary | ICD-10-CM | POA: Insufficient documentation

## 2019-06-06 DIAGNOSIS — E11649 Type 2 diabetes mellitus with hypoglycemia without coma: Secondary | ICD-10-CM | POA: Insufficient documentation

## 2019-06-06 DIAGNOSIS — E119 Type 2 diabetes mellitus without complications: Secondary | ICD-10-CM | POA: Insufficient documentation

## 2019-06-06 LAB — COMPREHENSIVE METABOLIC PANEL
ALT: 36 U/L (ref 0–44)
AST: 25 U/L (ref 15–41)
Albumin: 3.2 g/dL — ABNORMAL LOW (ref 3.5–5.0)
Alkaline Phosphatase: 68 U/L (ref 38–126)
Anion gap: 7 (ref 5–15)
BUN: 22 mg/dL — ABNORMAL HIGH (ref 6–20)
CO2: 26 mmol/L (ref 22–32)
Calcium: 9.8 mg/dL (ref 8.9–10.3)
Chloride: 104 mmol/L (ref 98–111)
Creatinine, Ser: 0.9 mg/dL (ref 0.44–1.00)
GFR calc Af Amer: >60 mL/min (ref >60)
GFR calc non Af Amer: >60 mL/min (ref >60)
Glucose, Bld: 95 mg/dL (ref 70–99)
Potassium: 4.2 mmol/L (ref 3.5–5.1)
Sodium: 137 mmol/L (ref 135–145)
Total Bilirubin: 0.4 mg/dL (ref 0.3–1.2)
Total Protein: 6 g/dL — ABNORMAL LOW (ref 6.5–8.1)

## 2019-06-06 LAB — CBC WITH DIFFERENTIAL/PLATELET
Abs Immature Granulocytes: 0.05 10*3/uL (ref 0.00–0.07)
Basophils Absolute: 0 10*3/uL (ref 0.0–0.1)
Basophils Relative: 1 %
Eosinophils Absolute: 0 10*3/uL (ref 0.0–0.5)
Eosinophils Relative: 1 %
HCT: 38.8 % (ref 36.0–46.0)
Hemoglobin: 13.1 g/dL (ref 12.0–15.0)
Immature Granulocytes: 1 %
Lymphocytes Relative: 35 %
Lymphs Abs: 1.4 10*3/uL (ref 0.7–4.0)
MCH: 30.9 pg (ref 26.0–34.0)
MCHC: 33.8 g/dL (ref 30.0–36.0)
MCV: 91.5 fL (ref 80.0–100.0)
Monocytes Absolute: 0.5 10*3/uL (ref 0.1–1.0)
Monocytes Relative: 12 %
Neutro Abs: 1.9 10*3/uL (ref 1.7–7.7)
Neutrophils Relative %: 50 %
Platelets: 190 10*3/uL (ref 150–400)
RBC: 4.24 MIL/uL (ref 3.87–5.11)
RDW: 12 % (ref 11.5–15.5)
WBC: 3.9 10*3/uL — ABNORMAL LOW (ref 4.0–10.5)
nRBC: 0 % (ref 0.0–0.2)

## 2019-06-06 LAB — FERRITIN: Ferritin: 1481 ng/mL — ABNORMAL HIGH (ref 11–307)

## 2019-06-06 LAB — C-REACTIVE PROTEIN: CRP: 8.1 mg/dL — ABNORMAL HIGH (ref <1.0)

## 2019-06-06 LAB — GLUCOSE, CAPILLARY
Glucose-Capillary: 157 mg/dL — ABNORMAL HIGH (ref 70–99)
Glucose-Capillary: 265 mg/dL — ABNORMAL HIGH (ref 70–99)
Glucose-Capillary: 99 mg/dL (ref 70–99)

## 2019-06-06 LAB — D-DIMER, QUANTITATIVE: D-Dimer, Quant: 0.33 ug/mL-FEU (ref 0.00–0.50)

## 2019-06-06 LAB — MAGNESIUM: Magnesium: 1.8 mg/dL (ref 1.7–2.4)

## 2019-06-06 NOTE — Discharge Planning (Signed)
Patient IV removed.  RN assessment revealed stability for DC to home with husband.  Discussed importance of isolation for the next 2 weeks beyond DC and to monitor self and husband for increased SOB, tireness or uncontrolled temp.  Told to FU with PCP. No scripts needed.  DC contract signed and placed in chart.  Once ready, will be wheeled to front and husband transporting home via car.

## 2019-06-06 NOTE — Discharge Instructions (Signed)
COVID-19 COVID-19 La COVID-19 es una infeccin respiratoria causada por un virus llamado coronavirus tipo 2 causante del sndrome respiratorio agudo grave (SARS-CoV-2). La enfermedad tambin se conoce como enfermedad por coronavirus o nuevo coronavirus. En algunas personas, el virus puede no ocasionar sntomas. En otras, puede producir una infeccin grave. La infeccin puede empeorar rpidamente y causar complicaciones, como:  Neumona o infeccin en los pulmones.  Sndrome de dificultad respiratoria aguda o SDRA. Se trata de la acumulacin de lquido en los pulmones.  Insuficiencia respiratoria aguda. Se trata de una afeccin en la que no pasa suficiente oxgeno de los pulmones al cuerpo.  Sepsis o choque sptico. Se trata de una reaccin grave del cuerpo ante una infeccin.  Problemas de coagulacin.  Infecciones secundarias debido a bacterias u hongos. El virus que causa la COVID-19 es contagioso. Esto significa que puede transmitirse de [place] persona a otra a travs de las gotitas de saliva de la tos y de los estornudos (secreciones respiratorias). Cules son las causas? Esta enfermedad es causada por un virus. Usted puede contagiarse con este virus:  Al aspirar las gotitas que una persona infectada elimina al toser o Brewing technologist.  Al tocar algo, como una mesa o el picaportes de [place], que estuvo expuesto al virus (contaminado) y luego tocarse la boca, nariz o los ojos. Qu incrementa el riesgo? Riesgo de infeccin Es ms probable que se infecte con este virus si:  Vive o viaja a una zona donde hay un brote de COVID-19.  Rodman Comp en contacto con una persona enferma que recientemente viaj a una zona con un brote de COVID-19.  Cuida o vive con una persona infectada con COVID-19. Riesgo de enfermedad grave Es ms probable que se enferme gravemente por el virus si:  Tiene 63aos o ms.  Tiene una enfermedad crnica que disminuye la capacidad del cuerpo para combatir las  infecciones (immunocomprometido).  Vive en un hogar de ancianos o centro de atencin a Barrister's clerk.  Tiene una enfermedad prolongada (crnica), como las siguientes: ? Enfermedad pulmonar crnica, que incluye la enfermedad pulmonar obstructiva crnica o asma. ? Enfermedad cardaca. ? Diabetes. ? Enfermedad renal crnica. ? Enfermedad heptica.  Es obeso. Cules son los signos o sntomas? Los sntomas de esta afeccin pueden ser de leves a graves. Los sntomas pueden aparecer en el trmino de [address] a [address] despus de haber estado expuesto al virus. Incluyen los siguientes:  Killen.  Tos.  Dificultad para respirar.  Escalofros.  Dolores musculares.  Dolor de Investment banker, operational.  Prdida del gusto o Armed forces logistics/support/administrative officer. Algunas personas tambin pueden [employer], como nuseas, vmitos o diarrea. Es posible que otras personas no tengan sntomas de COVID-19. Cmo se diagnostica? Esta afeccin se puede diagnosticar en funcin de lo siguiente:  Sus signos y sntomas, especialmente si: ? Vive en una zona donde hay un brote de COVID-19. ? Viaj recientemente a una zona donde el virus es frecuente. ? Cuida o vive con Ardelia Mems persona a quien se le diagnostic COVID-19.  Un examen fsico.  Anlisis de laboratorio que pueden incluir: ? Un hisopado nasal para tomar [place] de lquido de la nariz. ? Un hisopado de garganta para tomar Truddie Coco de lquido de la garganta. ? Una muestra de mucosidad de los pulmones (esputo). ? Anlisis de [place].  Los estudios de diagnstico por imgenes pueden incluir radiografas, exploracin por tomografa computarizada (TC) o ecografa. Cmo se trata? En este momento, no hay ningn medicamento para tratar la COVID-19. Los medicamentos para tratar  otras enfermedades se usan a modo de ensayo para comprobar si son eficaces contra la COVID-19. El mdico le informar sobre las maneras de tratar los sntomas. En la [employer], la infeccin  es leve y puede controlarse en el hogar con reposo, lquidos y medicamentos de [place]. El tratamiento para una infeccin grave suele realizarse en la unidad de cuidados intensivos (UCI) de un hospital. Puede incluir uno o ms de los siguientes. Estos tratamientos se administran hasta que los sntomas mejoran.  Recibir lquidos y [employer] a travs de una va intravenosa.  Oxgeno complementario. Para administrar oxgeno extra, se [place] un tubo en la Doran Durand, una mascarilla o una campana de oxgeno.  Colocarlo para que se recueste boca abajo (decbito prono). Esto facilita el ingreso de oxgeno a los pulmones.  Uso continuo de [place] de presin positiva de las vas areas (CPAP) o de presin positiva de las vas areas de dos niveles (BPAP). Este tratamiento utiliza una presin de aire leve para Theatre manager las vas respiratorias abiertas. Un tubo conectado a un motor administra oxgeno al cuerpo.  Respirador. Este tratamiento mueve el aire dentro y fuera de los pulmones mediante el uso de un tubo que se coloca en la trquea.  Traqueostoma. En este procedimiento se hace un orificio en el cuello para insertar un tubo de respiracin.  Oxigenacin por membrana extracorprea (OMEC). En este procedimiento, los pulmones tienen la posibilidad de recuperarse al asumir las funciones del corazn y los pulmones. Suministra oxgeno al cuerpo y elimina el dixido de carbono. Siga estas instrucciones en su casa: Estilo de vida  Si est enfermo, qudese en su casa, excepto para obtener atencin mdica. El mdico le indicar cunto tiempo debe quedarse en casa. Llame al mdico antes de buscar atencin mdica.  Haga reposo en su casa como se lo haya indicado el mdico.  No consuma ningn producto que contenga nicotina o tabaco, como cigarrillos, cigarrillos electrnicos y tabaco de Higher education careers adviser. Si necesita ayuda para dejar de fumar, consulte al mdico.  Retome sus actividades normales como se lo haya  indicado el mdico. Pregntele al mdico qu actividades son seguras para usted. Instrucciones generales  Use los medicamentos de venta libre y los recetados solamente como se lo haya indicado el mdico.  Beba suficiente lquido como para Theatre manager la orina de color amarillo plido.  Concurra a todas las visitas de seguimiento como se lo haya indicado el mdico. Esto es importante. Cmo se evita?  No hay ninguna vacuna que ayude a prevenir la infeccin por la COVID-19. Sin embargo, hay medidas que puede tomar para protegerse y Museum/gallery curator a Producer, television/film/video de este virus. Para protegerse:   No viaje a zonas donde la COVID-19 sea un riesgo. Las zonas donde se informa la presencia de la COVID-19 [place] con frecuencia. Para identificar las zonas de alto riesgo y las restricciones de viaje, consulte el sitio web de viajes de Garment/textile technologist for [employer] and Prevention Librarian, academic) (Centros para el Control y la Prevencin de Arboriculturist): FatFares.com.br  Si vive o debe viajar a una zona donde COVID-19 es un riesgo, tome precauciones para evitar infecciones. ? Aljese de [place]. ? Lvese las manos frecuentemente con agua y [place]. Use desinfectante para manos con alcohol si no dispone de [place] y [place]. ? Evite tocarse la boca, la cara, los ojos o la [place]. ? Evite salir de su casa, siga las indicaciones de su estado y [place] autoridades sanitarias locales. ? Si debe  salir de su casa, use un barbijo de tela o una mascarilla facial. ? Desinfecte los objetos y las superficies que se tocan con frecuencia todos [place]. Pueden incluir:  Encimeras y [place].  Picaportes e interruptores de luz.  Lavabos, fregaderos y grifos.  Aparatos electrnicos tales como telfonos, controles remotos, teclados, computadoras y tabletas. Cmo proteger a los dems: Si tiene sntomas de la COVID-19, tome medidas para evitar que el virus se propague a Producer, television/film/video.  Si cree  que tiene una infeccin por la COVID-19, comunquese de inmediato con su mdico. Informe al equipo de atencin mdica que cree que puede tener una infeccin por la COVID-19.  Qudese en su casa. Salga de su casa solo para buscar atencin mdica. No utilice el transporte pblico.  No viaje mientras est enfermo.  Lvese las manos frecuentemente con agua y [place]. Usar desinfectante para manos con alcohol si no dispone de [place] y [place].  Mantngase alejado de quienes vivan con usted. Permita que los miembros de la familia sanos cuiden a los nios y las [place], si es posible. Si tiene que cuidar a los nios o las mascotas, lvese las manos con frecuencia y use un barbijo. Si es posible, permanezca en su habitacin, separado de los dems. Utilice un bao diferente.  Asegrese de que todas las personas que viven en su casa se laven bien las manos y con frecuencia.  Tosa o estornude en un pauelo de papel o sobre su manga o codo. No tosa o estornude al aire ni se cubra la boca o la nariz con la [place].  Use un barbijo de tela o una mascarilla facial. Dnde buscar ms informacin  Centers for Disease Control and Prevention (Centros para el Control y la Prevencin de Arboriculturist): PurpleGadgets.be  World Health Organization (Organizacin Mundial de la Salud): https://www.castaneda.info/ Comunquese con un mdico si:  Vive o ha viajado a una zona donde la COVID-19 es un riesgo y tiene sntomas de infeccin.  Ha tenido contacto con alguien que tiene COVID-19 y usted tiene sntomas de infeccin. Solicite ayuda de inmediato si:  Tiene dificultad para respirar.  Siente dolor u opresin en el pecho.  Experimenta confusin.  [place] uas de los dedos y los labios de color [place].  Tiene dificultad para despertarse.  Los sntomas empeoran. Estos sntomas pueden representar un problema grave que constituye Engineer, maintenance (IT). No espere a  ver si los sntomas desaparecen. Solicite atencin mdica de inmediato. Comunquese con el servicio de emergencias de su localidad (911 en los [place]). No conduzca por sus propios medios [employer] hospital. Informe al personal mdico de emergencias si cree que tiene COVID-19. Resumen  La COVID-19 es una infeccin respiratoria causada por un virus. Tambin se conoce como enfermedad por coronavirus o nuevo coronavirus. Puede causar infecciones graves, como neumona, sndrome de dificultad respiratoria aguda, insuficiencia respiratoria aguda o sepsis.  El virus que causa la COVID-19 es contagioso. Esto significa que puede transmitirse de [place] persona a otra a travs de las gotitas de saliva de la tos y de los estornudos.  Es ms probable que desarrolle una enfermedad grave si tiene 65 aos o ms, tiene un sistema inmunitario dbil, vive en un hogar de ancianos o tiene enfermedad crnica.  No hay ningn medicamento para tratar la COVID-19. El mdico le informar sobre las maneras de tratar los sntomas.  Tome medidas para protegerse y Museum/gallery curator a los Location manager las infecciones. Lvese las manos con frecuencia y desinfecte los objetos y  las superficies que se tocan con frecuencia todos los das. Mantngase alejado de las personas que estn enfermas y use un barbijo si est enfermo. Esta informacin no tiene Marine scientist el consejo del mdico. Asegrese de hacerle al mdico cualquier pregunta que tenga. Document Released: 08/09/2018 Document Revised: 11/11/2018 Document Reviewed: 08/09/2018 Elsevier Patient Education  2020 [employer].

## 2019-06-06 NOTE — Discharge Summary (Signed)
[facility]  Physician Discharge Summary   Patient ID: Cynthia [name] MRN: [id number] DOB/AGE: 1970-12-27 48 y.o.  Admit date: 06/01/2019 Discharge date: 06/07/2019  PCP: Katherina Mires, MD  DISCHARGE DIAGNOSES:  Pneumonia due to COVID-19 History of renal transplant Acute kidney injury, resolved Diabetes mellitus type 2, uncontrolled with hyperglycemia Essential hypertension Mild transaminitis  RECOMMENDATIONS FOR OUTPATIENT FOLLOW UP: 1. Follow-up with primary care provider    Home Health: None Equipment/Devices: None  CODE STATUS: Full code  DISCHARGE CONDITION: fair  Diet recommendation: Modified carbohydrate  INITIAL HISTORY: 48 year old with past medical history significant for anxiety, hypertension, hyperlipidemia, diabetes type 2, anemia history of end-stage renal disease previously on dialysis status post transplant in 2015 at [facility], recently diagnosed with COVID-19 who presented with fatigue, sleepiness.  She reported cough and shortness of breath.  She also was experiencing abdominal pain.  Chest x-ray did not show any obvious abnormalities.  CT scan of the abdomen pelvis did not show any acute intra-abdominal findings however did show opacities in the lungs.  Due to her immunocompromise status patient was hospitalized.   HOSPITAL COURSE:   Pneumonia due to COVID-19 Patient was never hypoxic.  Due to her immunocompromise status patient was hospitalized.  She was placed on remdesivir.  Her usual steroid dose was continued.  Patient did experience a lot of coughing at the beginning of this illness. Inflammatory markers were noted to be elevated.  However patient feels better.  Symptoms have improved.  She has ambulated without any difficulty.  Okay for discharge home today.    Diabetes mellitus type 2, uncontrolled with hyperglycemia Patient presented with blood glucose in the 500 range.  Anion gap was normal.  Patient was given IV fluids.  Initially  required insulin drip.  Transitioned to Lantus.  HbA1c 11.1 in October.  May resume her usual home medication regimen.  Acute kidney injury/prior history of ESRD/status post renal transplant in 2015 Baseline creatinine around 1.1-1.2.  Came in with a creatinine of 1.51.  Patient was hydrated.  Renal function is now normal and stable.  Magnesium was repleted.    For her history of renal transplant she is on immunosuppressants including tacrolimus, Myfortic, prednisone.  She is also on fludrocortisone.  These can be continued at home.  Essential hypertension Continue home medications  Hyponatremia Resolved  Hyperkalemia Resolved.  Mild transaminitis Resolved.  Most likely due to COVID-19.  Overall stable.  Okay for discharge home today.   PERTINENT LABS:  The results of significant diagnostics from this hospitalization (including imaging, microbiology, ancillary and laboratory) are listed below for reference.    Microbiology: Recent Results (from the past 240 hour(s))  Urine culture     Status: Abnormal   Collection Time: 06/01/19  8:40 PM   Specimen: Urine, Random  Result Value Ref Range Status   Specimen Description URINE, RANDOM  Final   Special Requests   Final    NONE Performed at [facility], [address] [place], [place] [address]    Culture 20,000 COLONIES/mL ENTEROCOCCUS FAECALIS (A)  Final   Report Status 06/04/2019 FINAL  Final   Organism ID, Bacteria ENTEROCOCCUS FAECALIS (A)  Final      Susceptibility   Enterococcus faecalis - MIC*    AMPICILLIN <=2 SENSITIVE Sensitive     NITROFURANTOIN <=16 SENSITIVE Sensitive     VANCOMYCIN 1 SENSITIVE Sensitive     * 20,000 COLONIES/mL ENTEROCOCCUS FAECALIS  SARS CORONAVIRUS 2 (TAT 6-24 HRS) Nasopharyngeal Nasopharyngeal Swab  Status: Abnormal   Collection Time: 06/02/19 12:18 AM   Specimen: Nasopharyngeal Swab  Result Value Ref Range Status   SARS Coronavirus 2 POSITIVE (A) NEGATIVE Final    Comment:  RESULT CALLED TO, READ BACK BY AND VERIFIED WITH: Maloree ROBERTSON [email or web address]  ON 12.8.2020 BY TCALDWELL MT. (NOTE) SARS-CoV-2 target nucleic acids are DETECTED. The SARS-CoV-2 RNA is generally detectable in upper and lower respiratory specimens during the acute phase of infection. Positive results are indicative of the presence of SARS-CoV-2 RNA. Clinical correlation with patient history and other diagnostic information is  necessary to determine patient infection status. Positive results do not rule out bacterial infection or co-infection with other viruses.  The expected result is Negative. Fact Sheet for Patients: SugarRoll.be Fact Sheet for Healthcare Providers: https://www.woods-mathews.com/ This test is not yet approved or cleared by the [place] FDA and  has been authorized for detection and/or diagnosis of SARS-CoV-2 by FDA under an Emergency Use Authorization (EUA). This EUA will remain  in effect (meaning this te st can be used) for the duration of the COVID-19 declaration under Section 564(b)(1) of the Act, 21 U.S.C. section 360bbb-3(b)(1), unless the authorization is terminated or revoked sooner. Performed at [facility], [place] [address], [place], [place] [address]      Labs:  COVID-19 Labs  Recent Labs    06/05/19 0500 06/06/19 0250  DDIMER 0.31 0.33  FERRITIN 1,855* 1,481*  CRP 6.5* 8.1*    Lab Results  Component Value Date   SARSCOV2NAA POSITIVE (A) 06/02/2019   SARSCOV2NAA NOT DETECTED 02/27/2019      Basic Metabolic Panel: Recent Labs  Lab 06/02/19 0523 06/03/19 0420 06/04/19 0520 06/05/19 0500 06/06/19 0250  NA 133* 134* 136 135 137  K 4.4 4.5 4.3 4.4 4.2  CL 103 104 103 101 104  CO2 20* 22 24 26 26   GLUCOSE 160* 214* 228* 117* 95  BUN 25* 18 18 21* 22*  CREATININE 1.18* 0.77 0.84 0.93 0.90  CALCIUM 9.5 9.2 9.6 9.4 9.8  MG  --  1.6* 1.5* 1.8 1.8  PHOS  --  1.4*  --   --   --     Liver Function Tests: Recent Labs  Lab 06/01/19 1816 06/03/19 0420 06/04/19 0520 06/05/19 0500 06/06/19 0250  AST 46* 30 29 34 25  ALT 51* 39 35 41 36  ALKPHOS 84 63 68 59 68  BILITOT 0.9 0.7 0.8 0.7 0.4  PROT 6.7 6.1* 6.5 6.0* 6.0*  ALBUMIN 3.6 3.3* 3.6 3.3* 3.2*   Recent Labs  Lab 06/01/19 1816  LIPASE 17   CBC: Recent Labs  Lab 06/02/19 0523 06/03/19 0420 06/04/19 0520 06/05/19 0500 06/06/19 0250  WBC 3.9* 3.2* 4.2 4.7 3.9*  NEUTROABS  --  1.9 2.3 2.7 1.9  HGB 13.9 12.9 13.6 13.0 13.1  HCT 40.7 38.9 38.9 38.9 38.8  MCV 89.3 90.5 89.4 90.9 91.5  PLT 142* 155 172 184 190    CBG: Recent Labs  Lab 06/05/19 1152 06/05/19 1638 06/06/19 0817 06/06/19 1141 06/06/19 1454  GLUCAP 209* 290* 157* 265* 99     IMAGING STUDIES CT ABDOMEN PELVIS WO CONTRAST  Result Date: 06/01/2019 CLINICAL DATA:  Nausea and vomiting, COVID-19 positivity EXAM: CT ABDOMEN AND PELVIS WITHOUT CONTRAST TECHNIQUE: Multidetector CT imaging of the abdomen and pelvis was performed following the standard protocol without IV contrast. COMPARISON:  None. FINDINGS: Lower chest: Patchy ground-glass opacities are noted in the bases bilaterally consistent with the given clinical history of  COVID-19 positivity. Scattered areas of the reverse halo sign are noted as well. Hepatobiliary: No focal liver abnormality is seen. Status post cholecystectomy. No biliary dilatation. Pancreas: Unremarkable. No pancreatic ductal dilatation or surrounding inflammatory changes. Spleen: Normal in size without focal abnormality. Adrenals/Urinary Tract: Adrenal glands are within normal limits. The native kidneys are shrunken consistent with the known history of end-stage renal disease. Renal transplant is noted in the right lower quadrant. No obstructive changes are seen. No Perirenal inflammatory changes are seen. The bladder is decompressed. Stomach/Bowel: Scattered diverticular change of the colon is noted. The appendix is  within normal limits. No small bowel or gastric abnormality is seen. Vascular/Lymphatic: Aortic atherosclerosis. No enlarged abdominal or pelvic lymph nodes. Reproductive: Status post hysterectomy. No adnexal masses. Other: No abdominal wall hernia or abnormality. No abdominopelvic ascites. Musculoskeletal: No acute or significant osseous findings. IMPRESSION: Changes in the lung bases consistent with the known history of COVID-19 positivity. Changes consistent with end-stage renal disease and right lower quadrant renal transplant. No acute abnormality noted. Diverticulosis without diverticulitis. Electronically Signed   By: Inez Catalina M.D.   On: 06/01/2019 23:13   DG Chest Portable 1 View  Result Date: 06/01/2019 CLINICAL DATA:  Cough.  COVID 19 positive EXAM: PORTABLE CHEST 1 VIEW COMPARISON:  01/04/2015 FINDINGS: The heart size and mediastinal contours are within normal limits. Both lungs are clear. The visualized skeletal structures are unremarkable. IMPRESSION: Clear lungs. Electronically Signed   By: Ulyses Jarred M.D.   On: 06/01/2019 22:13    DISCHARGE EXAMINATION: Vitals:   06/05/19 0825 06/05/19 1635 06/05/19 2053 06/06/19 0639  BP: (!) 117/57 (!) 136/59 138/60 (!) 128/51  Pulse: 72 69 69 71  Resp:  17 17   Temp: 98.2 F (36.8 C) 98.4 F (36.9 C) 98.1 F (36.7 C) 98.4 F (36.9 C)  TempSrc: Oral Oral Oral Oral  SpO2: 96% 98% 100% 92%  Weight:      Height:       General appearance: Awake alert.  In no distress Resp: Clear to auscultation bilaterally.  Normal effort Cardio: S1-S2 is normal regular.  No S3-S4.  No rubs murmurs or bruit GI: Abdomen is soft.  Nontender nondistended.  Bowel sounds are present normal.  No masses organomegaly   DISPOSITION: Home  Discharge Instructions    Call MD for:  difficulty breathing, headache or visual disturbances   Complete by: As directed    Call MD for:  extreme fatigue   Complete by: As directed    Call MD for:  persistant dizziness  or light-headedness   Complete by: As directed    Call MD for:  persistant nausea and vomiting   Complete by: As directed    Call MD for:  severe uncontrolled pain   Complete by: As directed    Call MD for:  temperature >100.4   Complete by: As directed    Diet Carb Modified   Complete by: As directed    Discharge instructions   Complete by: As directed    COVID 19 INSTRUCTIONS  - You are felt to be stable enough to no longer require inpatient monitoring, testing, and treatment, though you will need to follow the recommendations below: - Based on the CDC's non-test criteria for ending self-isolation: You may not return to work/leave the home until at least 21 days since symptom onset AND 3 days without a fever (without taking tylenol, ibuprofen, etc.) AND have improvement in respiratory symptoms. - Do not take NSAID medications (including,  but not limited to, ibuprofen, advil, motrin, naproxen, aleve, goody's powder, etc.) - Follow up with your doctor in the next week via telehealth or seek medical attention right away if your symptoms get WORSE.  - Consider donating plasma after you have recovered (either 14 days after a negative test or 28 days after symptoms have completely resolved) because your antibodies to this virus may be helpful to give to others with life-threatening infections. Please go to the website www.[email or web address] if you would like to consider volunteering for plasma donation.    Directions for you at home:  Wear a facemask You should wear a facemask that covers your nose and mouth when you are in the same room with other people and when you visit a healthcare provider. People who live with or visit you should also wear a facemask while they are in the same room with you.  Separate yourself from other people in your home As much as possible, you should stay in a different room from other people in your home. Also, you should use a separate bathroom, if  available.  Avoid sharing household items You should not share dishes, drinking glasses, cups, eating utensils, towels, bedding, or other items with other people in your home. After using these items, you should wash them thoroughly with soap and water.  Cover your coughs and sneezes Cover your mouth and nose with a tissue when you cough or sneeze, or you can cough or sneeze into your sleeve. Throw used tissues in a lined trash can, and immediately wash your hands with soap and water for at least 20 seconds or use an alcohol-based hand rub.  Wash your [facility] your hands often and thoroughly with soap and water for at least 20 seconds. You can use an alcohol-based hand sanitizer if soap and water are not available and if your hands are not visibly dirty. Avoid touching your eyes, nose, and mouth with unwashed hands.  Directions for those who live with, or provide care at home for you:  Limit the number of people who have contact with the patient If possible, have only one caregiver for the patient. Other household members should stay in another home or place of residence. If this is not possible, they should stay in another room, or be separated from the patient as much as possible. Use a separate bathroom, if available. Restrict visitors who do not have an essential need to be in the home.  Ensure good ventilation Make sure that shared spaces in the home have good air flow, such as from an air conditioner or an opened window, weather permitting.  Wash your hands often Wash your hands often and thoroughly with soap and water for at least 20 seconds. You can use an alcohol based hand sanitizer if soap and water are not available and if your hands are not visibly dirty. Avoid touching your eyes, nose, and mouth with unwashed hands. Use disposable paper towels to dry your hands. If not available, use dedicated cloth towels and replace them when they become wet.  Wear a facemask and  gloves Wear a disposable facemask at all times in the room and gloves when you touch or have contact with the patient's blood, body fluids, and/or secretions or excretions, such as sweat, saliva, sputum, nasal mucus, vomit, urine, or feces.  Ensure the mask fits over your nose and mouth tightly, and do not touch it during use. Throw out disposable facemasks and gloves after using them. Do not  reuse. Wash your hands immediately after removing your facemask and gloves. If your personal clothing becomes contaminated, carefully remove clothing and launder. Wash your hands after handling contaminated clothing. Place all used disposable facemasks, gloves, and other waste in a lined container before disposing them with other household waste. Remove gloves and wash your hands immediately after handling these items.  Do not share dishes, glasses, or other household items with the patient Avoid sharing household items. You should not share dishes, drinking glasses, cups, eating utensils, towels, bedding, or other items with a patient who is confirmed to have, or being evaluated for, COVID-19 infection. After the person uses these items, you should wash them thoroughly with soap and water.  Wash laundry thoroughly Immediately remove and wash clothes or bedding that have blood, body fluids, and/or secretions or excretions, such as sweat, saliva, sputum, nasal mucus, vomit, urine, or feces, on them. Wear gloves when handling laundry from the patient. Read and follow directions on labels of laundry or clothing items and detergent. In general, wash and dry with the warmest temperatures recommended on the label.  Clean all areas the individual has used often Clean all touchable surfaces, such as counters, tabletops, doorknobs, bathroom fixtures, toilets, phones, keyboards, tablets, and bedside tables, every day. Also, clean any surfaces that may have blood, body fluids, and/or secretions or excretions on  them. Wear gloves when cleaning surfaces the patient has come in contact with. Use a diluted bleach solution (e.g., dilute bleach with 1 part bleach and 10 parts water) or a household disinfectant with a label that says EPA-registered for coronaviruses. To make a bleach solution at home, add 1 tablespoon of bleach to 1 quart (4 cups) of water. For a larger supply, add  cup of bleach to 1 gallon (16 cups) of water. Read labels of cleaning products and follow recommendations provided on product labels. Labels contain instructions for safe and effective use of the cleaning product including precautions you should take when applying the product, such as wearing gloves or eye protection and making sure you have good ventilation during use of the product. Remove gloves and wash hands immediately after cleaning.  Monitor yourself for signs and symptoms of illness Caregivers and household members are considered close contacts, should monitor their health, and will be asked to limit movement outside of the home to the extent possible. Follow the monitoring steps for close contacts listed on the symptom monitoring form.   If you have additional questions, contact your local health department or call the epidemiologist on call at [phone] (available 24/7). This guidance is subject to change. For the most up-to-date guidance from [facility], please refer to their website: YouBlogs.pl   You were cared for by a hospitalist during your hospital stay. If you have any questions about your discharge medications or the care you received while you were in the hospital after you are discharged, you can call the unit and asked to speak with the hospitalist on call if the hospitalist that took care of you is not available. Once you are discharged, your primary care physician will handle any further medical issues. Please note that NO REFILLS for any discharge  medications will be authorized once you are discharged, as it is imperative that you return to your primary care physician (or establish a relationship with a primary care physician if you do not have one) for your aftercare needs so that they can reassess your need for medications and monitor your lab values. If you do  not have a primary care physician, you can call [phone] for a physician referral.   Increase activity slowly   Complete by: As directed         Allergies as of 06/06/2019      Reactions   Other Rash   Burns skin.Please use paper tape only Burns skin.Please use paper tape only   Tape Other (See Comments)   Burns skin.  Please use paper tape only      Medication List    TAKE these medications   amLODipine 10 MG tablet Commonly known as: NORVASC Take 10 mg by mouth daily. Notes to patient: Next: 12/13 AM   aspirin 81 MG EC tablet Take 1 tablet (81 mg total) by mouth daily. What changed: when to take this Notes to patient: Next: 12/13 AM   carvedilol 25 MG tablet Commonly known as: COREG Take 12.5 mg by mouth 2 (two) times daily with a meal. Notes to patient: Next: 12/12 PM   cetirizine 10 MG tablet Commonly known as: ZYRTEC Take 5 mg by mouth daily. Takes 0.5 tablet daily Notes to patient: Next: 12/13  AM   docusate sodium 100 MG capsule Commonly known as: COLACE Take 100 mg by mouth 2 (two) times daily. Notes to patient: Next: 12/12 PM   Envarsus XR 1 MG Tb24 Generic drug: Tacrolimus ER Take 3 mg by mouth every morning. Notes to patient: Next: 12/13 AM   fludrocortisone 0.1 MG tablet Commonly known as: FLORINEF Take 0.1 mg by mouth daily. Notes to patient: Next: 12/13 AM   FLUoxetine 40 MG capsule Commonly known as: PROZAC Take 40 mg by mouth daily. Notes to patient: Next: 12/13 AM   gabapentin 300 MG capsule Commonly known as: NEURONTIN Take 300 mg by mouth at bedtime. Notes to patient: Next: 12/12 PM   Lantus SoloStar 100 UNIT/ML  Solostar Pen Generic drug: Insulin Glargine Inject 30 Units into the skin every morning. Notes to patient: Next: 12/13 AM   magnesium oxide 400 MG tablet Commonly known as: MAG-OX Take 400 mg by mouth 2 (two) times daily. Notes to patient: Next: 12/12 PM   mycophenolate 180 MG EC tablet Commonly known as: MYFORTIC Take 360 mg by mouth 2 (two) times daily. Notes to patient: Next: 12/12 PM   OneTouch Verio test strip Generic drug: glucose blood 1 each by Other route 2 (two) times daily. And lancets 2/day   pantoprazole 40 MG tablet Commonly known as: PROTONIX Take 40 mg by mouth daily. Notes to patient: Next: 12/13 AM   predniSONE 5 MG tablet Commonly known as: DELTASONE Take 5 mg by mouth daily. Notes to patient: Next: 12/13 AM   rosuvastatin 5 MG tablet Commonly known as: CRESTOR Take 5 mg by mouth at bedtime. Notes to patient: Next: 12/12 PM   SUPER B COMPLEX PO Take 1 tablet by mouth daily. Notes to patient: Next: 12/13 AM   Vitamin D3 25 MCG (1000 UT) Caps Take 1,000 Units by mouth daily. Notes to patient: Next: 12/13 AM   vitamin E 400 UNIT capsule Take 400 Units by mouth daily. Notes to patient: Next: 12/13AM        Follow-up Information    Katherina Mires, MD. Schedule an appointment as soon as possible for a visit in 2 week(s).   Specialty: Family Medicine Contact information: [place] [address] [phone]           TOTAL DISCHARGE TIME: 35 minutes  Bonnielee Haff  [facility] operational officer on [email or web address]  06/07/2019, 2:28 PM

## 2019-06-09 ENCOUNTER — Other Ambulatory Visit: Payer: Self-pay

## 2019-06-09 LAB — GLUCOSE, CAPILLARY: Glucose-Capillary: 183 mg/dL — ABNORMAL HIGH (ref 70–99)

## 2019-06-11 ENCOUNTER — Ambulatory Visit (INDEPENDENT_AMBULATORY_CARE_PROVIDER_SITE_OTHER): Payer: Medicare Other | Admitting: Endocrinology

## 2019-06-11 ENCOUNTER — Encounter: Payer: Self-pay | Admitting: Endocrinology

## 2019-06-11 ENCOUNTER — Other Ambulatory Visit: Payer: Self-pay

## 2019-06-11 DIAGNOSIS — E119 Type 2 diabetes mellitus without complications: Secondary | ICD-10-CM | POA: Diagnosis not present

## 2019-06-11 DIAGNOSIS — Z794 Long term (current) use of insulin: Secondary | ICD-10-CM

## 2019-06-11 MED ORDER — HUMULIN N KWIKPEN 100 UNIT/ML ~~LOC~~ SUPN: 30.0000 [IU] | PEN_INJECTOR | SUBCUTANEOUS | 11 refills | Status: DC

## 2019-06-11 NOTE — Patient Instructions (Addendum)
check your blood sugar twice a day.  vary the time of day when you check, between before the 3 meals, and at bedtime.  also check if you have symptoms of your blood sugar being too high or too low.  please keep a record of the readings and bring it to your next appointment here (or you can bring the meter itself).  You can write it on any piece of paper.  please call us sooner if your blood sugar goes below 70, or if you have a lot of readings over 200. I have sent a prescription to your pharmacy, to change the Lantus to NPH, 30 units each morning On this type of insulin schedule, you should eat meals on a regular schedule.  If a meal is missed or significantly delayed, your blood sugar could go low. Please come back for a follow-up appointment in 1 month.

## 2019-06-11 NOTE — Progress Notes (Signed)
Subjective:    Patient ID: Cynthia [name], female    DOB: October 03, 1970, 48 y.o.   MRN: [id number]  HPI  telehealth visit today via phone x 8 minutes Alternatives to telehealth are presented to this patient, and the patient agrees to the telehealth visit. Pt is advised of the cost of the visit, and agrees to this, also.   Patient is at home, and I am at the office.   Persons attending the telehealth visit: the patient and I Pt returns for f/u of diabetes mellitus:  DM type: Insulin-requiring type 2. Dx'ed: 5366 Complications: DR, CAD, and ESRD (transplant 2017) Therapy: insulin since soon after dx GDM: 1990 DKA: never Severe hypoglycemia: last episode was mid-2019 Pancreatitis: never Pancreatic imaging: normal on 2017 CT Other: she declined pump rx, due to high copay; she is on multiple daily injections now, but due to h/o noncompliance, she takes QD insulin, after poor results with multiple daily injections.   Interval history: Pt says she never misses the insulin.  no cbg record, but states cbg's varies from 50-290, but averages approx 190.  It is in general higher as the day goes on.  Past Medical History:  Diagnosis Date  . Anemia    when on dialysis  . Anxiety   . Depression   . ESRD on hemodialysis ([place])    Home HD 5x per week- not on dialysis now had tramsplant 4/17  . GERD (gastroesophageal reflux disease)   . History of blood transfusion    transfusion reaction  . Hyperlipidemia   . Hypertension   . 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 per pt-dr levy also II  . Kidney transplant recipient   . Neuromuscular disorder ([place])    NEUROPATHY    Past Surgical History:  Procedure Laterality Date  . ABDOMINAL HYSTERECTOMY    . BREAST EXCISIONAL BIOPSY Right 08/2015  . BREAST LUMPECTOMY WITH RADIOACTIVE SEED LOCALIZATION Right 09/14/2015   Procedure: RIGHT BREAST LUMPECTOMY WITH RADIOACTIVE SEED LOCALIZATION;   Surgeon: Donnie Mesa, MD;  Location: [place];  Service: General;  Laterality: Right;  . BREAST SURGERY Bilateral    biopsy bilateral  . CATARACT EXTRACTION Bilateral    bilateral  . CHOLECYSTECTOMY    . CYST REMOVAL NECK    . DIALYSIS FISTULA CREATION Left   . EYE SURGERY Bilateral    lazer  . LEFT HEART CATH AND CORONARY ANGIOGRAPHY N/A 03/04/2019   Procedure: LEFT HEART CATH AND CORONARY ANGIOGRAPHY;  Surgeon: Martinique, Peter M, MD;  Location: [facility];  Service: Cardiovascular;  Laterality: N/A;  . LEFT HEART CATHETERIZATION WITH CORONARY ANGIOGRAM N/A 03/30/2014   Procedure: LEFT HEART CATHETERIZATION WITH CORONARY ANGIOGRAM;  Surgeon: Sinclair Grooms, MD;  Location: [facility] CATH LAB;  Service: Cardiovascular;  Laterality: N/A;  . LIPOMA EXCISION N/A 02/06/2017   Procedure: EXCISION POSTERIOR NECK SEBACEOUS CYST;  Surgeon: Coralie Keens, MD;  Location: [place];  Service: General;  Laterality: N/A;  . 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   Procedure: REPAIR OF ARTERIOVENOUS FISTULA ANEURYSM;  Surgeon: Serafina Mitchell, MD;  Location: [place];  Service: Vascular;  Laterality: Left;  . REVISON OF ARTERIOVENOUS FISTULA Left 09/28/2013   Procedure: EXCISE ESCHAR LEFT ARM  ARTERIOVENOUS FISTULA WITH PLICATION OF LEFT ARM ARTERIOVENOUS FISTULA;  Surgeon: Elam Dutch, MD;  Location: [place];  Service: Vascular;  Laterality: Left;  . REVISON OF ARTERIOVENOUS FISTULA Left 08/26/2015  Procedure: RESECTION ANEURYSM OF LEFT ARM ARTERIOVENOUS FISTULA  ;  Surgeon: Serafina Mitchell, MD;  Location: [place];  Service: Vascular;  Laterality: Left;  . TUBAL LIGATION      Social History   Socioeconomic History  . Marital status: Legally Separated    Spouse name: Not on file  . Number of children: 3  . Years of education: 9  . Highest education level: Not on file  Occupational History  . Occupation: disabled  Tobacco Use  . Smoking status: Never Smoker  .  Smokeless tobacco: Never Used  Substance and Sexual Activity  . Alcohol use: No  . Drug use: No  . Sexual activity: Yes  Other Topics Concern  . Not on file  Social History Narrative   Lives in home with daughters, grand daughter, son-in-law   Caffeine use - 1 cup coffee sometimes    Social Determinants of Health   Financial Resource Strain:   . Difficulty of Paying Living Expenses: Not on file  Food Insecurity:   . Worried About Charity fundraiser in the Last Year: Not on file  . Ran Out of Food in the Last Year: Not on file  Transportation Needs:   . Lack of Transportation (Medical): Not on file  . Lack of Transportation (Non-Medical): Not on file  Physical Activity:   . Days of Exercise per Week: Not on file  . Minutes of Exercise per Session: Not on file  Stress:   . Feeling of Stress : Not on file  Social Connections:   . Frequency of Communication with Friends and Family: Not on file  . Frequency of Social Gatherings with Friends and Family: Not on file  . Attends Religious Services: Not on file  . Active Member of Clubs or Organizations: Not on file  . Attends Archivist Meetings: Not on file  . Marital Status: Not on file  Intimate Partner Violence:   . Fear of Current or Ex-Partner: Not on file  . Emotionally Abused: Not on file  . Physically Abused: Not on file  . Sexually Abused: Not on file    Current Outpatient Medications on File Prior to Visit  Medication Sig Dispense Refill  . amLODipine (NORVASC) 10 MG tablet Take 10 mg by mouth daily.    Marland Kitchen aspirin EC 81 MG EC tablet Take 1 tablet (81 mg total) by mouth daily. (Patient taking differently: Take 81 mg by mouth at bedtime. ) 90 tablet 3  . B Complex-C (SUPER B COMPLEX PO) Take 1 tablet by mouth daily.     . carvedilol (COREG) 25 MG tablet Take 12.5 mg by mouth 2 (two) times daily with a meal.     . cetirizine (ZYRTEC) 10 MG tablet Take 5 mg by mouth daily. Takes 0.5 tablet daily    .  Cholecalciferol (VITAMIN D3) 1000 units CAPS Take 1,000 Units by mouth daily.     Marland Kitchen docusate sodium (COLACE) 100 MG capsule Take 100 mg by mouth 2 (two) times daily.    . fludrocortisone (FLORINEF) 0.1 MG tablet Take 0.1 mg by mouth daily.    Marland Kitchen FLUoxetine (PROZAC) 40 MG capsule Take 40 mg by mouth daily.    Marland Kitchen gabapentin (NEURONTIN) 300 MG capsule Take 300 mg by mouth at bedtime.  12  . glucose blood (ONETOUCH VERIO) test strip 1 each by Other route 2 (two) times daily. And lancets 2/day 200 each 3  . magnesium oxide (MAG-OX) 400 MG tablet Take 400  mg by mouth 2 (two) times daily.     . mycophenolate (MYFORTIC) 180 MG EC tablet Take 360 mg by mouth 2 (two) times daily.    . pantoprazole (PROTONIX) 40 MG tablet Take 40 mg by mouth daily.  5  . predniSONE (DELTASONE) 5 MG tablet Take 5 mg by mouth daily.   2  . rosuvastatin (CRESTOR) 5 MG tablet Take 5 mg by mouth at bedtime.    . Tacrolimus ER (ENVARSUS XR) 1 MG TB24 Take 3 mg by mouth every morning.    . vitamin E 400 UNIT capsule Take 400 Units by mouth daily.     No current facility-administered medications on file prior to visit.    Allergies  Allergen Reactions  . Other Rash    Burns skin.Please use paper tape only Burns skin.Please use paper tape only  . Tape Other (See Comments)    Burns skin.  Please use paper tape only    Family History  Problem Relation Age of Onset  . Cancer Maternal Grandmother   . Diabetes Maternal Grandfather   . Diabetes Paternal Grandmother   . Diabetes Mother   . Kidney disease Mother   . Diabetes Father   . Heart disease Father   . Kidney disease Father   . Diabetes Sister   . Asthma Sister   . Lupus Sister   . Diabetes Brother     There were no vitals taken for this visit.   Review of Systems Denies LOC    Objective:   Physical Exam     Assessment & Plan:  Insulin-requiring type 2 DM, with renal failure: based on the pattern of her cbg's, she needs a faster-acting qam  insulin  Patient Instructions  check your blood sugar twice a day.  vary the time of day when you check, between before the 3 meals, and at bedtime.  also check if you have symptoms of your blood sugar being too high or too low.  please keep a record of the readings and bring it to your next appointment here (or you can bring the meter itself).  You can write it on any piece of paper.  please call us sooner if your blood sugar goes below 70, or if you have a lot of readings over 200. I have sent a prescription to your pharmacy, to change the Lantus to NPH, 30 units each morning On this type of insulin schedule, you should eat meals on a regular schedule.  If a meal is missed or significantly delayed, your blood sugar could go low. Please come back for a follow-up appointment in 1 month.

## 2019-06-16 DIAGNOSIS — U071 COVID-19: Secondary | ICD-10-CM | POA: Insufficient documentation

## 2019-07-21 ENCOUNTER — Ambulatory Visit: Payer: Medicare Other | Attending: Internal Medicine

## 2019-07-21 DIAGNOSIS — Z20822 Contact with and (suspected) exposure to covid-19: Secondary | ICD-10-CM

## 2019-07-22 LAB — NOVEL CORONAVIRUS, NAA: SARS-CoV-2, NAA: NOT DETECTED

## 2019-09-01 ENCOUNTER — Other Ambulatory Visit: Payer: Self-pay

## 2019-09-01 ENCOUNTER — Telehealth: Payer: Self-pay | Admitting: Endocrinology

## 2019-09-01 DIAGNOSIS — E119 Type 2 diabetes mellitus without complications: Secondary | ICD-10-CM

## 2019-09-01 MED ORDER — PEN NEEDLES 30G X 8 MM MISC: 1.0000 | Freq: Every day | 0 refills | Status: DC

## 2019-09-01 MED ORDER — HUMULIN N KWIKPEN 100 UNIT/ML ~~LOC~~ SUPN: 30.0000 [IU] | PEN_INJECTOR | SUBCUTANEOUS | 0 refills | Status: DC

## 2019-09-01 NOTE — Telephone Encounter (Signed)
Outpatient Medication Detail   Disp Refills Start End   Insulin NPH, Human,, Isophane, (HUMULIN N KWIKPEN) 100 UNIT/ML Kiwkpen 9 mL 0 09/01/2019    Sig - Route: Inject 30 Units into the skin every morning. OVERDUE FOR AN APPT. WILL PROVIDE 30 DAY SUPPLY - Subcutaneous   Sent to pharmacy as: Insulin NPH, Human,, Isophane, (HUMULIN N KWIKPEN) 100 UNIT/ML Kiwkpen   E-Prescribing Status: Receipt confirmed by pharmacy (09/01/2019  1:43 PM EST)

## 2019-09-01 NOTE — Telephone Encounter (Signed)
MEDICATION: Humalin N  PHARMACY:  [facility] on [address]  IS THIS A 90 DAY SUPPLY :   IS PATIENT OUT OF MEDICATION: yes  IF NOT; HOW MUCH IS LEFT:   LAST APPOINTMENT DATE: @12 /17/2020  NEXT APPOINTMENT DATE:@3 /15/2021  DO WE HAVE YOUR PERMISSION TO LEAVE A DETAILED MESSAGE: yes  OTHER COMMENTS:    **Let patient know to contact pharmacy at the end of the day to make sure medication is ready. **  ** Please notify patient to allow 48-72 hours to process**  **Encourage patient to contact the pharmacy for refills or they can request refills through [facility]**

## 2019-09-03 ENCOUNTER — Other Ambulatory Visit: Payer: Self-pay

## 2019-09-07 ENCOUNTER — Ambulatory Visit: Payer: Medicare Other | Admitting: Endocrinology

## 2019-09-09 ENCOUNTER — Other Ambulatory Visit: Payer: Self-pay

## 2019-09-09 ENCOUNTER — Ambulatory Visit (INDEPENDENT_AMBULATORY_CARE_PROVIDER_SITE_OTHER): Payer: Medicare Other | Admitting: Endocrinology

## 2019-09-09 VITALS — BP 134/60 | HR 73 | Ht 61.0 in | Wt 181.2 lb

## 2019-09-09 DIAGNOSIS — Z794 Long term (current) use of insulin: Secondary | ICD-10-CM

## 2019-09-09 DIAGNOSIS — E119 Type 2 diabetes mellitus without complications: Secondary | ICD-10-CM | POA: Diagnosis not present

## 2019-09-09 LAB — POCT GLYCOSYLATED HEMOGLOBIN (HGB A1C): Hemoglobin A1C: 13.1 % — AB (ref 4.0–5.6)

## 2019-09-09 MED ORDER — ONETOUCH VERIO VI STRP: 1.0000 | ORAL_STRIP | Freq: Two times a day (BID) | 3 refills | Status: DC

## 2019-09-09 MED ORDER — HUMULIN N KWIKPEN 100 UNIT/ML ~~LOC~~ SUPN: 40.0000 [IU] | PEN_INJECTOR | SUBCUTANEOUS | 3 refills | Status: DC

## 2019-09-09 NOTE — Patient Instructions (Addendum)
I have sent a prescription to your pharmacy, to increase the insulin to 40 units each morning. Here is a new meter.  I have sent a prescription to your pharmacy, for strips. check your blood sugar once a day.  vary the time of day when you check, between before the 3 meals, and at bedtime.  also check if you have symptoms of your blood sugar being too high or too low.  please keep a record of the readings and bring it to your next appointment here (or you can bring the meter itself).  You can write it on any piece of paper.  please call us sooner if your blood sugar goes below 70, or if you have a lot of readings over 200. Please come back for a follow-up appointment in 1 month.

## 2019-09-09 NOTE — Progress Notes (Signed)
Subjective:    Patient ID: Cynthia [name], female    DOB: 26-Jul-1970, 49 y.o.   MRN: [id number]  HPI Pt returns for f/u of diabetes mellitus:  DM type: Insulin-requiring type 2. Dx'ed: 2025 Complications: DR, CAD, and ESRD (transplant 2017) Therapy: insulin since soon after dx GDM: 1990 DKA: never Severe hypoglycemia: last episode was mid-2019 Pancreatitis: never Pancreatic imaging: normal on 2017 CT Other: she declined pump rx, due to high copay; due to h/o noncompliance, she takes QD insulin, after poor results with multiple daily injections.   Interval history: Pt says she sometimes misses the insulin.  She says she lost her cbg meter.  pt states she feels well in general. Past Medical History:  Diagnosis Date  . Anemia    when on dialysis  . Anxiety   . Depression   . ESRD on hemodialysis ([place])    Home HD 5x per week- not on dialysis now had tramsplant 4/17  . GERD (gastroesophageal reflux disease)   . History of blood transfusion    transfusion reaction  . Hyperlipidemia   . Hypertension   . 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 per pt-dr levy also II  . Kidney transplant recipient   . Neuromuscular disorder ([place])    NEUROPATHY    Past Surgical History:  Procedure Laterality Date  . ABDOMINAL HYSTERECTOMY    . BREAST EXCISIONAL BIOPSY Right 08/2015  . BREAST LUMPECTOMY WITH RADIOACTIVE SEED LOCALIZATION Right 09/14/2015   Procedure: RIGHT BREAST LUMPECTOMY WITH RADIOACTIVE SEED LOCALIZATION;  Surgeon: Donnie Mesa, MD;  Location: [place];  Service: General;  Laterality: Right;  . BREAST SURGERY Bilateral    biopsy bilateral  . CATARACT EXTRACTION Bilateral    bilateral  . CHOLECYSTECTOMY    . CYST REMOVAL NECK    . DIALYSIS FISTULA CREATION Left   . EYE SURGERY Bilateral    lazer  . LEFT HEART CATH AND CORONARY ANGIOGRAPHY N/A 03/04/2019   Procedure: LEFT HEART CATH AND CORONARY  ANGIOGRAPHY;  Surgeon: Martinique, Peter M, MD;  Location: [place] [facility];  Service: Cardiovascular;  Laterality: N/A;  . LEFT HEART CATHETERIZATION WITH CORONARY ANGIOGRAM N/A 03/30/2014   Procedure: LEFT HEART CATHETERIZATION WITH CORONARY ANGIOGRAM;  Surgeon: Sinclair Grooms, MD;  Location: [facility] CATH LAB;  Service: Cardiovascular;  Laterality: N/A;  . LIPOMA EXCISION N/A 02/06/2017   Procedure: EXCISION POSTERIOR NECK SEBACEOUS CYST;  Surgeon: Coralie Keens, MD;  Location: [place];  Service: General;  Laterality: N/A;  . 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   Procedure: REPAIR OF ARTERIOVENOUS FISTULA ANEURYSM;  Surgeon: Serafina Mitchell, MD;  Location: [place];  Service: Vascular;  Laterality: Left;  . REVISON OF ARTERIOVENOUS FISTULA Left 09/28/2013   Procedure: EXCISE ESCHAR LEFT ARM  ARTERIOVENOUS FISTULA WITH PLICATION OF LEFT ARM ARTERIOVENOUS FISTULA;  Surgeon: Elam Dutch, MD;  Location: [place];  Service: Vascular;  Laterality: Left;  . REVISON OF ARTERIOVENOUS FISTULA Left 08/26/2015   Procedure: RESECTION ANEURYSM OF LEFT ARM ARTERIOVENOUS FISTULA  ;  Surgeon: Serafina Mitchell, MD;  Location: [place];  Service: Vascular;  Laterality: Left;  . TUBAL LIGATION      Social History   Socioeconomic History  . Marital status: Legally Separated    Spouse name: Not on file  . Number of children: 3  . Years of education: 9  . Highest education level: Not on file  Occupational History  .  Occupation: disabled  Tobacco Use  . Smoking status: Never Smoker  . Smokeless tobacco: Never Used  Substance and Sexual Activity  . Alcohol use: No  . Drug use: No  . Sexual activity: Yes  Other Topics Concern  . Not on file  Social History Narrative   Lives in home with daughters, grand daughter, son-in-law   Caffeine use - 1 cup coffee sometimes    Social Determinants of Health   Financial Resource Strain:   . Difficulty of Paying Living Expenses:   Food Insecurity:   .  Worried About Charity fundraiser in the Last Year:   . Arboriculturist in the Last Year:   Transportation Needs:   . Film/video editor (Medical):   Marland Kitchen Lack of Transportation (Non-Medical):   Physical Activity:   . Days of Exercise per Week:   . Minutes of Exercise per Session:   Stress:   . Feeling of Stress :   Social Connections:   . Frequency of Communication with Friends and Family:   . Frequency of Social Gatherings with Friends and Family:   . Attends Religious Services:   . Active Member of Clubs or Organizations:   . Attends Archivist Meetings:   Marland Kitchen Marital Status:   Intimate Partner Violence:   . Fear of Current or Ex-Partner:   . Emotionally Abused:   Marland Kitchen Physically Abused:   . Sexually Abused:     Current Outpatient Medications on File Prior to Visit  Medication Sig Dispense Refill  . amLODipine (NORVASC) 10 MG tablet Take 10 mg by mouth daily.    Marland Kitchen aspirin EC 81 MG EC tablet Take 1 tablet (81 mg total) by mouth daily. (Patient taking differently: Take 81 mg by mouth at bedtime. ) 90 tablet 3  . B Complex-C (SUPER B COMPLEX PO) Take 1 tablet by mouth daily.     . carvedilol (COREG) 25 MG tablet Take 12.5 mg by mouth 2 (two) times daily with a meal.     . cetirizine (ZYRTEC) 10 MG tablet Take 5 mg by mouth daily. Takes 0.5 tablet daily    . Cholecalciferol (VITAMIN D3) 1000 units CAPS Take 1,000 Units by mouth daily.     Marland Kitchen docusate sodium (COLACE) 100 MG capsule Take 100 mg by mouth 2 (two) times daily.    . fludrocortisone (FLORINEF) 0.1 MG tablet Take 0.1 mg by mouth daily.    Marland Kitchen FLUoxetine (PROZAC) 40 MG capsule Take 40 mg by mouth daily.    Marland Kitchen gabapentin (NEURONTIN) 300 MG capsule Take 300 mg by mouth at bedtime.  12  . Insulin Pen Needle (PEN NEEDLES) 30G X 8 MM MISC 1 each by Does not apply route daily. E11.9 90 each 0  . magnesium oxide (MAG-OX) 400 MG tablet Take 400 mg by mouth 2 (two) times daily.     . mycophenolate (MYFORTIC) 180 MG EC tablet  Take 360 mg by mouth 2 (two) times daily.    . pantoprazole (PROTONIX) 40 MG tablet Take 40 mg by mouth daily.  5  . predniSONE (DELTASONE) 5 MG tablet Take 5 mg by mouth daily.   2  . rosuvastatin (CRESTOR) 5 MG tablet Take 5 mg by mouth at bedtime.    . Tacrolimus ER (ENVARSUS XR) 1 MG TB24 Take 3 mg by mouth every morning.    . vitamin E 400 UNIT capsule Take 400 Units by mouth daily.     No current facility-administered medications  on file prior to visit.    Allergies  Allergen Reactions  . Other Rash    Burns skin.Please use paper tape only Burns skin.Please use paper tape only  . Tape Other (See Comments)    Burns skin.  Please use paper tape only    Family History  Problem Relation Age of Onset  . Cancer Maternal Grandmother   . Diabetes Maternal Grandfather   . Diabetes Paternal Grandmother   . Diabetes Mother   . Kidney disease Mother   . Diabetes Father   . Heart disease Father   . Kidney disease Father   . Diabetes Sister   . Asthma Sister   . Lupus Sister   . Diabetes Brother     BP 134/60   Pulse 73   Ht 5\' 1"  (1.549 m)   Wt 181 lb 3.2 oz (82.2 kg)   SpO2 99%   BMI 34.24 kg/m    Review of Systems She has gained a few lbs since last ov    Objective:   Physical Exam VITAL SIGNS:  See vs page GENERAL: no distress Pulses: dorsalis pedis intact bilat.   MSK: no deformity of the feet CV: no leg edema Skin:  no ulcer on the feet.  normal color and temp on the feet. Neuro: sensation is intact to touch on the feet  Lab Results  Component Value Date   HGBA1C 13.1 (A) 09/09/2019   Lab Results  Component Value Date   CREATININE 0.90 06/06/2019   BUN 22 (H) 06/06/2019   NA 137 06/06/2019   K 4.2 06/06/2019   CL 104 06/06/2019   CO2 26 06/06/2019        Assessment & Plan:  Insulin-requiring type 2 DM: worse Noncompliance with insulin and cbg monitoring.  Hypoglycemia: this limits DM rx  Patient Instructions  I have sent a prescription  to your pharmacy, to increase the insulin to 40 units each morning. Here is a new meter.  I have sent a prescription to your pharmacy, for strips. check your blood sugar once a day.  vary the time of day when you check, between before the 3 meals, and at bedtime.  also check if you have symptoms of your blood sugar being too high or too low.  please keep a record of the readings and bring it to your next appointment here (or you can bring the meter itself).  You can write it on any piece of paper.  please call us sooner if your blood sugar goes below 70, or if you have a lot of readings over 200. Please come back for a follow-up appointment in 1 month.

## 2019-09-18 ENCOUNTER — Encounter: Payer: Self-pay | Admitting: Gastroenterology

## 2019-10-07 ENCOUNTER — Encounter: Payer: Self-pay | Admitting: Endocrinology

## 2019-10-09 ENCOUNTER — Other Ambulatory Visit: Payer: Self-pay

## 2019-10-13 ENCOUNTER — Ambulatory Visit: Payer: Medicare Other | Admitting: Endocrinology

## 2019-10-27 ENCOUNTER — Ambulatory Visit (AMBULATORY_SURGERY_CENTER): Payer: Self-pay

## 2019-10-27 ENCOUNTER — Other Ambulatory Visit: Payer: Self-pay

## 2019-10-27 VITALS — Temp 96.0°F | Ht 61.0 in | Wt 188.0 lb

## 2019-10-27 DIAGNOSIS — Z1211 Encounter for screening for malignant neoplasm of colon: Secondary | ICD-10-CM

## 2019-10-27 MED ORDER — NA SULFATE-K SULFATE-MG SULF 17.5-3.13-1.6 GM/177ML PO SOLN: 1.0000 | Freq: Once | ORAL | 0 refills | Status: AC

## 2019-10-27 NOTE — Addendum Note (Signed)
Addended by: Etheleen Nicks on: 10/27/2019 09:12 AM   Modules accepted: Level of Service

## 2019-10-27 NOTE — Progress Notes (Signed)
No egg or soy allergy known to patient  No issues with past sedation with any surgeries  or procedures, no intubation problems  No diet pills per patient No home 02 use per patient  No blood thinners per patient  Pt HAS issues with constipation  No A fib or A flutter  EMMI video sent to pt's e mail   previsit with Interpreter present  PT [place] ON 10/27/19.  2 DAY SUPREP/MIRALAX DUE TO CHRONIC CONSTIPATION  Due to the COVID-19 pandemic we are asking patients to follow these guidelines. Please only bring one care partner. Please be aware that your care partner may wait in the car in the parking lot or if they feel like they will be too hot to wait in the car, they may wait in the lobby on the 4th floor. All care partners are required to wear a mask the entire time (we do not have any that we can provide them), they need to practice social distancing, and we will do a Covid check for all patient's and care partners when you arrive. Also we will check their temperature and your temperature. If the care partner waits in their car they need to stay in the parking lot the entire time and we will call them on their cell phone when the patient is ready for discharge so they can bring the car to the front of the building. Also all patient's will need to wear a mask into building.

## 2019-11-10 ENCOUNTER — Ambulatory Visit (AMBULATORY_SURGERY_CENTER): Payer: Medicare Other | Admitting: Gastroenterology

## 2019-11-10 ENCOUNTER — Other Ambulatory Visit: Payer: Self-pay

## 2019-11-10 ENCOUNTER — Encounter: Payer: Self-pay | Admitting: Gastroenterology

## 2019-11-10 VITALS — BP 181/66 | HR 64 | Temp 96.8°F | Resp 13 | Ht 61.0 in | Wt 188.0 lb

## 2019-11-10 DIAGNOSIS — D123 Benign neoplasm of transverse colon: Secondary | ICD-10-CM

## 2019-11-10 DIAGNOSIS — Z1211 Encounter for screening for malignant neoplasm of colon: Secondary | ICD-10-CM | POA: Diagnosis not present

## 2019-11-10 MED ORDER — SODIUM CHLORIDE 0.9 % IV SOLN: 500.0000 mL | Freq: Once | INTRAVENOUS | Status: DC

## 2019-11-10 NOTE — Progress Notes (Signed)
Report to PACU, RN, vss, BBS= Clear.  

## 2019-11-10 NOTE — Progress Notes (Signed)
Pt's states no medical or surgical changes since previsit or office visit. 

## 2019-11-10 NOTE — Patient Instructions (Addendum)
Handouts on polyps, hemorrhoids, diverticulosis, and high fiber diet given to you today  Await pathology results     USTED TUVO UN PROCEDIMIENTO ENDOSCPICO HOY EN EL  ENDOSCOPY CENTER:   Lea el informe del procedimiento que se le entreg para cualquier pregunta especfica sobre lo que se Primary school teacher.  Si el informe del examen no responde a sus preguntas, por favor llame a su gastroenterlogo para aclararlo.  Si usted solicit que no se le den [employer] de lo que se Estate manager/land agent en su procedimiento al [employer] va a cuidar, entonces el informe del procedimiento se ha incluido en un sobre sellado para que usted lo revise despus cuando le sea ms conveniente.   LO QUE PUEDE ESPERAR: Algunas sensaciones de hinchazn en el abdomen.  Puede tener ms gases de lo normal.  El caminar puede ayudarle a eliminar el aire que se le puso en el tracto gastrointestinal durante el procedimiento y reducir la hinchazn.  Si le hicieron una endoscopia inferior (como una colonoscopia o una sigmoidoscopia flexible), podra notar manchas de sangre en las heces fecales o en el papel higinico.  Si se someti a una preparacin intestinal para su procedimiento, es posible que no tenga una evacuacin intestinal normal durante [employer].   Tenga en cuenta:  Es posible que note un poco de irritacin y congestin en la nariz o algn drenaje.  Esto es debido al oxgeno [employer] durante su procedimiento.  No hay que preocuparse y esto debe desaparecer ms o Scientist, research (medical).   SNTOMAS PARA REPORTAR INMEDIATAMENTE:  Despus de una endoscopia inferior (colonoscopia o sigmoidoscopia flexible):  Cantidades excesivas de sangre en las heces fecales  Sensibilidad significativa o empeoramiento de los dolores abdominales   Hinchazn aguda del abdomen que antes no tena   Fiebre de 100F o ms   Para asuntos urgentes o de Freight forwarder, puede comunicarse con un gastroenterlogo a cualquier hora llamando al [phone].  DIETA:  Recomendamos una comida pequea al principio, pero luego puede continuar con su dieta normal.  Tome muchos lquidos, Teacher, adult education las bebidas alcohlicas durante 24 horas.    ACTIVIDAD:  Debe planear tomarse las cosas con calma por el resto del da y no debe CONDUCIR ni usar maquinaria pesada Programmer, applications (debido a los medicamentos de sedacin utilizados durante el examen).     SEGUIMIENTO: Nuestro personal llamar al nmero que aparece en su historial al siguiente da hbil de su procedimiento para ver cmo se siente y para responder cualquier pregunta o inquietud que pueda tener con respecto a la informacin que se le dio despus del procedimiento. Si no podemos contactarle, le dejaremos un mensaje.  Sin embargo, si se siente bien y no tiene Paediatric nurse, no es necesario que nos devuelva la llamada.  Asumiremos que ha regresado a sus actividades diarias normales sin incidentes. Si se le tomaron algunas biopsias, le contactaremos por telfono o por carta en las prximas 3 semanas.  Si no ha sabido [employer] biopsias en el transcurso de 3 semanas, por favor llmenos al [phone].   FIRMAS/CONFIDENCIALIDAD: Usted y/o el acompaante que le cuide han firmado documentos que se ingresarn en su historial mdico electrnico.  Estas firmas atestiguan el hecho de que la informacin anterior

## 2019-11-10 NOTE — Op Note (Signed)
[facility] Patient Name: Cynthia [name] Procedure Date: 11/10/2019 9:46 AM MRN: [id number] Endoscopist: Justice Britain , MD Age: 49 Referring MD:  Date of Birth: Jul 28, 1970 Gender: Female Account #: [id number] Procedure:                Colonoscopy Indications:              Screening for colorectal malignant neoplasm Medicines:                Monitored Anesthesia Care Procedure:                Pre-Anesthesia Assessment:                           - Prior to the procedure, a History and Physical                            was performed, and patient medications and                            allergies were reviewed. The patient's tolerance of                            previous anesthesia was also reviewed. The risks                            and benefits of the procedure and the sedation                            options and risks were discussed with the patient.                            All questions were answered, and informed consent                            was obtained. Prior Anticoagulants: The patient has                            taken no previous anticoagulant or antiplatelet                            agents except for aspirin. ASA Grade Assessment: II                            - A patient with mild systemic disease. After                            reviewing the risks and benefits, the patient was                            deemed in satisfactory condition to undergo the                            procedure.  After obtaining informed consent, the colonoscope                            was passed under direct vision. Throughout the                            procedure, the patient's blood pressure, pulse, and                            oxygen saturations were monitored continuously. The                            Colonoscope was introduced through the anus and                            advanced to the 5 cm into the ileum. The                            colonoscopy was performed without difficulty. The                            patient tolerated the procedure. The quality of the                            bowel preparation was adequate. The terminal ileum,                            ileocecal valve, appendiceal orifice, and rectum                            were photographed. Scope In: 9:50:53 AM Scope Out: 10:09:07 AM Scope Withdrawal Time: 0 hours 13 minutes 36 seconds  Total Procedure Duration: 0 hours 18 minutes 14 seconds  Findings:                 The digital rectal exam findings include                            hemorrhoids. Pertinent negatives include no                            palpable rectal lesions.                           The terminal ileum and ileocecal valve appeared                            normal.                           Two sessile polyps were found in the transverse                            colon. The polyps were 2 to 3 mm in size. These  polyps were removed with a cold snare. Resection                            and retrieval were complete.                           Many small-mouthed diverticula were found in the                            recto-sigmoid colon and sigmoid colon.                           Normal mucosa was found in the entire colon                            otherwise.                           Non-bleeding non-thrombosed external and internal                            hemorrhoids were found during retroflexion, during                            perianal exam and during digital exam. The                            hemorrhoids were Grade II (internal hemorrhoids                            that prolapse but reduce spontaneously). Complications:            No immediate complications. Estimated Blood Loss:     Estimated blood loss was minimal. Impression:               - Hemorrhoids found on digital rectal exam.                           -  The examined portion of the ileum was normal.                           - Two 2 to 3 mm polyps in the transverse colon,                            removed with a cold snare. Resected and retrieved.                           - Diverticulosis in the recto-sigmoid colon and in                            the sigmoid colon.                           - Normal mucosa in the entire examined colon.                           -  Non-bleeding non-thrombosed external and internal                            hemorrhoids. Recommendation:           - The patient will be observed post-procedure,                            until all discharge criteria are met.                           - Discharge patient to home.                           - Patient has a contact number available for                            emergencies. The signs and symptoms of potential                            delayed complications were discussed with the                            patient. Return to normal activities tomorrow.                            Written discharge instructions were provided to the                            patient.                           - High fiber diet.                           - Continue present medications.                           - Await pathology results.                           - Repeat colonoscopy in 7/10 years for surveillance                            based on pathology results and findings of                            adenomatous tissue.                           - The findings and recommendations were discussed                            with the patient. Justice Britain, MD 11/10/2019 10:18:27 AM

## 2019-11-10 NOTE — Progress Notes (Signed)
Called to room to assist during endoscopic procedure.  Patient ID and intended procedure confirmed with present staff. Received instructions for my participation in the procedure from the performing physician.  

## 2019-11-12 ENCOUNTER — Telehealth: Payer: Self-pay

## 2019-11-12 NOTE — Telephone Encounter (Signed)
  Follow up Call-  Call back number 11/10/2019  Post procedure Call Back phone  # [phone]  Permission to leave phone message Yes  Some recent data might be hidden     Patient questions:  Do you have a fever, pain , or abdominal swelling? No. Pain Score  0 *  Have you tolerated food without any problems? Yes.    Have you been able to return to your normal activities? Yes.    Do you have any questions about your discharge instructions: Diet   No. Medications  No. Follow up visit  No.  Do you have questions or concerns about your Care? No.  Actions: * If pain score is 4 or above: No action needed, pain <4.  1. Have you developed a fever since your procedure? no  2.   Have you had an respiratory symptoms (SOB or cough) since your procedure? no  3.   Have you tested positive for COVID 19 since your procedure no  4.   Have you had any family members/close contacts diagnosed with the COVID 19 since your procedure?  no   If yes to any of these questions please route to Joylene John, RN and Erenest Rasher, RN

## 2019-11-12 NOTE — Telephone Encounter (Signed)
1st follow up call made.  NAULM 

## 2019-11-13 ENCOUNTER — Other Ambulatory Visit: Payer: Self-pay

## 2019-11-15 ENCOUNTER — Encounter: Payer: Self-pay | Admitting: Gastroenterology

## 2019-11-17 ENCOUNTER — Ambulatory Visit (INDEPENDENT_AMBULATORY_CARE_PROVIDER_SITE_OTHER): Payer: Medicare Other | Admitting: Endocrinology

## 2019-11-17 ENCOUNTER — Other Ambulatory Visit: Payer: Self-pay

## 2019-11-17 ENCOUNTER — Encounter: Payer: Self-pay | Admitting: Endocrinology

## 2019-11-17 VITALS — BP 120/60 | HR 73 | Ht 61.0 in | Wt 187.8 lb

## 2019-11-17 DIAGNOSIS — Z794 Long term (current) use of insulin: Secondary | ICD-10-CM | POA: Diagnosis not present

## 2019-11-17 DIAGNOSIS — E119 Type 2 diabetes mellitus without complications: Secondary | ICD-10-CM | POA: Diagnosis not present

## 2019-11-17 LAB — POCT GLYCOSYLATED HEMOGLOBIN (HGB A1C): Hemoglobin A1C: 9.9 % — AB (ref 4.0–5.6)

## 2019-11-17 MED ORDER — ONETOUCH VERIO VI STRP: 1.0000 | ORAL_STRIP | Freq: Two times a day (BID) | 3 refills | Status: DC

## 2019-11-17 MED ORDER — HUMULIN N KWIKPEN 100 UNIT/ML ~~LOC~~ SUPN: 60.0000 [IU] | PEN_INJECTOR | SUBCUTANEOUS | 3 refills | Status: DC

## 2019-11-17 NOTE — Progress Notes (Signed)
Subjective:    Patient ID: Cynthia [name], female    DOB: 1970-09-05, 49 y.o.   MRN: [id number]  HPI Pt returns for f/u of diabetes mellitus:  DM type: Insulin-requiring type 2. Dx'ed: 1194 Complications: DR, CAD, and ESRD (transplant 2017) Therapy: insulin since soon after dx GDM: 1990 DKA: never Severe hypoglycemia: last episode was mid-2019 Pancreatitis: never Pancreatic imaging: normal on 2017 CT Other: she declined pump rx, due to high copay; due to h/o noncompliance, she takes QD insulin, after poor results with multiple daily injections.   Interval history: Pt says she sometimes misses the insulin.  no cbg record, but states cbg's vary from 88-399.  It is in general higher as the day goes on.  pt states she feels well in general.  Past Medical History:  Diagnosis Date  . Allergy    pollen  . Anemia    when on dialysis  . Anxiety   . Cataract    surgical repair bilateral  . Depression   . ESRD on hemodialysis ([place])    Home HD 5x per week- not on dialysis now had tramsplant 4/17  . GERD (gastroesophageal reflux disease)   . Hearing loss 2017   right ear  . History of blood transfusion    transfusion reaction  . Hyperlipidemia   . Hypertension   . 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 per pt-dr levy also II  . Kidney transplant recipient   . Neuromuscular disorder ([place])    NEUROPATHY    Past Surgical History:  Procedure Laterality Date  . ABDOMINAL HYSTERECTOMY    . BREAST EXCISIONAL BIOPSY Right 08/2015  . BREAST LUMPECTOMY WITH RADIOACTIVE SEED LOCALIZATION Right 09/14/2015   Procedure: RIGHT BREAST LUMPECTOMY WITH RADIOACTIVE SEED LOCALIZATION;  Surgeon: Donnie Mesa, MD;  Location: [facility];  Service: General;  Laterality: Right;  . BREAST SURGERY Bilateral    biopsy bilateral  . CATARACT EXTRACTION Bilateral    bilateral  . CHOLECYSTECTOMY    . CYST REMOVAL NECK    . DIALYSIS  FISTULA CREATION Left   . EYE SURGERY Bilateral    [facility]  . LEFT HEART CATH AND CORONARY ANGIOGRAPHY N/A 03/04/2019   Procedure: LEFT HEART CATH AND CORONARY ANGIOGRAPHY;  Surgeon: Martinique, Peter M, MD;  Location: [facility];  Service: Cardiovascular;  Laterality: N/A;  . LEFT HEART CATHETERIZATION WITH CORONARY ANGIOGRAM N/A 03/30/2014   Procedure: LEFT HEART CATHETERIZATION WITH CORONARY ANGIOGRAM;  Surgeon: Sinclair Grooms, MD;  Location: [facility] CATH [facility];  Service: Cardiovascular;  Laterality: N/A;  . LIPOMA EXCISION N/A 02/06/2017   Procedure: EXCISION POSTERIOR NECK SEBACEOUS CYST;  Surgeon: Coralie Keens, MD;  Location: [facility];  Service: General;  Laterality: N/A;  . 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 REVISON OF ARTERIOVENOUS FISTULA Left 09/28/2013   Procedure: EXCISE ESCHAR LEFT ARM  ARTERIOVENOUS FISTULA WITH PLICATION OF LEFT ARM ARTERIOVENOUS FISTULA;  Surgeon: Elam Dutch, MD;  Location: [facility];  Service: Vascular;  Laterality: Left;  . REVISON OF ARTERIOVENOUS FISTULA Left 08/26/2015   Procedure: RESECTION ANEURYSM OF LEFT ARM ARTERIOVENOUS FISTULA  ;  Surgeon: Serafina Mitchell, MD;  Location: [facility];  Service: Vascular;  Laterality: Left;  . TUBAL LIGATION      Social History  Socioeconomic History  . Marital status: Legally Separated    Spouse name: Not on file  . Number of children: 3  . Years of education: 9  . Highest education level: Not on file  Occupational History  . Occupation: disabled  Tobacco Use  . Smoking status: Never Smoker  . Smokeless tobacco: Never Used  Substance and Sexual Activity  . Alcohol use: Yes    Comment: occ  . Drug use: No  . Sexual activity: Yes  Other Topics Concern  . Not on file  Social History Narrative   Lives in home with daughters, grand daughter,  son-in-law   Caffeine use - 1 cup coffee sometimes    Social Determinants of Health   Financial Resource Strain:   . Difficulty of Paying Living Expenses:   Food Insecurity:   . Worried About Charity fundraiser in the Last Year:   . Arboriculturist in the Last Year:   Transportation Needs:   . Film/video editor (Medical):   Marland Kitchen Lack of Transportation (Non-Medical):   Physical Activity:   . Days of Exercise per Week:   . Minutes of Exercise per Session:   Stress:   . Feeling of Stress :   Social Connections:   . Frequency of Communication with Friends and Family:   . Frequency of Social Gatherings with Friends and Family:   . Attends Religious Services:   . Active Member of Clubs or Organizations:   . Attends Archivist Meetings:   Marland Kitchen Marital Status:   Intimate Partner Violence:   . Fear of Current or Ex-Partner:   . Emotionally Abused:   Marland Kitchen Physically Abused:   . Sexually Abused:     Current Outpatient Medications on File Prior to Visit  Medication Sig Dispense Refill  . amLODipine (NORVASC) 10 MG tablet Take 10 mg by mouth daily.    Marland Kitchen aspirin EC 81 MG EC tablet Take 1 tablet (81 mg total) by mouth daily. (Patient taking differently: Take 81 mg by mouth at bedtime. ) 90 tablet 3  . B Complex-C (SUPER B COMPLEX PO) Take 1 tablet by mouth daily.     . carvedilol (COREG) 25 MG tablet Take 12.5 mg by mouth 2 (two) times daily with a meal.     . cetirizine (ZYRTEC) 10 MG tablet Take 5 mg by mouth daily. Takes 0.5 tablet daily    . Cholecalciferol (VITAMIN D3) 1000 units CAPS Take 1,000 Units by mouth daily.     Marland Kitchen docusate sodium (COLACE) 100 MG capsule Take 100 mg by mouth 2 (two) times daily.    . fludrocortisone (FLORINEF) 0.1 MG tablet Take 0.1 mg by mouth daily.    Marland Kitchen FLUoxetine (PROZAC) 40 MG capsule Take 40 mg by mouth daily.    Marland Kitchen gabapentin (NEURONTIN) 300 MG capsule Take 300 mg by mouth at bedtime.  12  . Insulin Pen Needle (PEN NEEDLES) 30G X 8 MM MISC 1  each by Does not apply route daily. E11.9 90 each 0  . magnesium oxide (MAG-OX) 400 MG tablet Take 400 mg by mouth 2 (two) times daily.     . mycophenolate (MYFORTIC) 180 MG EC tablet Take 360 mg by mouth 2 (two) times daily.    . pantoprazole (PROTONIX) 40 MG tablet Take 40 mg by mouth daily.  5  . predniSONE (DELTASONE) 5 MG tablet Take 5 mg by mouth daily.   2  . rosuvastatin (CRESTOR) 5 MG tablet Take 5  mg by mouth at bedtime.    . Tacrolimus ER (ENVARSUS XR) 1 MG TB24 Take 3 mg by mouth every morning.    . vitamin E 400 UNIT capsule Take 400 Units by mouth daily.     No current facility-administered medications on file prior to visit.    Allergies  Allergen Reactions  . Other Rash    Burns skin.Please use paper tape only Burns skin.Please use paper tape only  . Tape Other (See Comments)    Burns skin.  Please use paper tape only    Family History  Problem Relation Age of Onset  . Cancer Maternal Grandmother   . Diabetes Maternal Grandfather   . Diabetes Paternal Grandmother   . Diabetes Mother   . Kidney disease Mother   . Diabetes Father   . Heart disease Father   . Kidney disease Father   . Diabetes Sister   . Asthma Sister   . Lupus Sister   . Diabetes Brother   . Colon cancer Neg Hx   . Colon polyps Neg Hx   . Esophageal cancer Neg Hx   . Rectal cancer Neg Hx   . Stomach cancer Neg Hx     BP 120/60   Pulse 73   Ht 5\' 1"  (1.549 m)   Wt 187 lb 12.8 oz (85.2 kg)   SpO2 96%   BMI 35.48 kg/m    Review of Systems She denies hypoglycemia    Objective:   Physical Exam VITAL SIGNS:  See vs page GENERAL: no distress Pulses: dorsalis pedis intact bilat.   MSK: no deformity of the feet CV: no leg edema Skin:  no ulcer on the feet.  normal color and temp on the feet. Neuro: sensation is intact to touch on the feet    Lab Results  Component Value Date   HGBA1C 9.9 (A) 11/17/2019       Assessment & Plan:  Insulin-requiring type 2 DM: she needs  increased rx.     Patient Instructions  I have sent a prescription to your pharmacy, to increase the insulin to 60 units each morning. On this type of insulin schedule, you should eat meals on a regular schedule.  If a meal is missed or significantly delayed, your blood sugar could go low.  check your blood sugar once a day.  vary the time of day when you check, between before the 3 meals, and at bedtime.  also check if you have symptoms of your blood sugar being too high or too low.  please keep a record of the readings and bring it to your next appointment here (or you can bring the meter itself).  You can write it on any piece of paper.  please call us sooner if your blood sugar goes below 70, or if you have a lot of readings over 200. Please come back for a follow-up appointment in 2 months.     He enviado una receta a su farmacia para aumentar la insulina a 60 unidades cada maana. En este tipo de horario de [place], debe comer en un horario regular. Si se salta una comida o se retrasa significativamente, su nivel de azcar en sangre podra bajar. controle su nivel de azcar en sangre una vez al da. Vare la hora del da en que lo comprueba, entre antes de las 3 comidas y antes de [place]. Compruebe tambin si tiene sntomas de que su nivel de Location manager en sangre es demasiado alto o demasiado bajo.  por favor mantenga un registro de las lecturas y Air cabin crew a su prxima cita aqu (o puede traer Nature conservation officer). Puedes escribirlo en cualquier hoja de papel. Llmenos antes si su nivel de azcar en sangre es inferior a 76 o si tiene muchas lecturas superiores a 200. Regrese para una cita de seguimiento en 2 meses.

## 2019-11-17 NOTE — Patient Instructions (Addendum)
I have sent a prescription to your pharmacy, to increase the insulin to 60 units each morning. On this type of insulin schedule, you should eat meals on a regular schedule.  If a meal is missed or significantly delayed, your blood sugar could go low.  check your blood sugar once a day.  vary the time of day when you check, between before the 3 meals, and at bedtime.  also check if you have symptoms of your blood sugar being too high or too low.  please keep a record of the readings and bring it to your next appointment here (or you can bring the meter itself).  You can write it on any piece of paper.  please call us sooner if your blood sugar goes below 70, or if you have a lot of readings over 200. Please come back for a follow-up appointment in 2 months.     He enviado una receta a su farmacia para aumentar la insulina a 60 unidades cada maana. En este tipo de horario de [place], debe comer en un horario regular. Si se salta una comida o se retrasa significativamente, su nivel de azcar en sangre podra bajar. controle su nivel de azcar en sangre una vez al da. Vare la hora del da en que lo comprueba, entre antes de las 3 comidas y antes de [place]. Compruebe tambin si tiene sntomas de que su nivel de Location manager en sangre es demasiado alto o demasiado bajo. por favor mantenga un registro de las lecturas y Air cabin crew a su prxima cita aqu (o puede traer Nature conservation officer). Puedes escribirlo en cualquier hoja de papel. Llmenos antes si su nivel de azcar en sangre es inferior a 30 o si tiene muchas lecturas superiores a 200. Regrese para una cita de seguimiento en 2 meses.

## 2020-03-04 ENCOUNTER — Ambulatory Visit (INDEPENDENT_AMBULATORY_CARE_PROVIDER_SITE_OTHER): Payer: Medicare Other | Admitting: Endocrinology

## 2020-03-04 ENCOUNTER — Ambulatory Visit: Payer: Medicare Other | Admitting: Endocrinology

## 2020-03-04 ENCOUNTER — Encounter: Payer: Self-pay | Admitting: Endocrinology

## 2020-03-04 ENCOUNTER — Other Ambulatory Visit: Payer: Self-pay

## 2020-03-04 VITALS — BP 144/60 | HR 76 | Ht 61.0 in | Wt 187.4 lb

## 2020-03-04 DIAGNOSIS — E119 Type 2 diabetes mellitus without complications: Secondary | ICD-10-CM | POA: Diagnosis not present

## 2020-03-04 DIAGNOSIS — Z794 Long term (current) use of insulin: Secondary | ICD-10-CM

## 2020-03-04 LAB — POCT GLYCOSYLATED HEMOGLOBIN (HGB A1C): Hemoglobin A1C: 9.4 % — AB (ref 4.0–5.6)

## 2020-03-04 MED ORDER — ONETOUCH VERIO VI STRP: 1.0000 | ORAL_STRIP | Freq: Two times a day (BID) | 3 refills | Status: DC

## 2020-03-04 MED ORDER — HUMULIN N KWIKPEN 100 UNIT/ML ~~LOC~~ SUPN: 65.0000 [IU] | PEN_INJECTOR | SUBCUTANEOUS | 3 refills | Status: DC

## 2020-03-04 NOTE — Progress Notes (Signed)
Subjective:    Patient ID: Cynthia [name], female    DOB: 06-Oct-1970, 49 y.o.   MRN: [id number]  HPI Pt returns for f/u of diabetes mellitus:  DM type: Insulin-requiring type 2. Dx'ed: 4944 Complications: DR, CAD, and ESRD (transplant 2017) Therapy: insulin since soon after dx GDM: 1990 DKA: never Severe hypoglycemia: last episode was mid-2019 Pancreatitis: never Pancreatic imaging: normal on 2017 CT Other: she declined pump rx, due to high copay; due to h/o noncompliance, she takes QD insulin, after poor results with multiple daily injections.   Interval history: Pt says she misses the insulin approx once per week.  no cbg record, but states cbg's vary from 55-260.  It is in general higher as the day goes on.  pt states she feels well in general.  Past Medical History:  Diagnosis Date  . Allergy    pollen  . Anemia    when on dialysis  . Anxiety   . Cataract    surgical repair bilateral  . Depression   . ESRD on hemodialysis ([place])    Home HD 5x per week- not on dialysis now had tramsplant 4/17  . GERD (gastroesophageal reflux disease)   . Hearing loss 2017   right ear  . History of blood transfusion    transfusion reaction  . Hyperlipidemia   . Hypertension   . 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 per pt-dr levy also II  . Kidney transplant recipient   . Neuromuscular disorder ([place])    NEUROPATHY    Past Surgical History:  Procedure Laterality Date  . ABDOMINAL HYSTERECTOMY    . BREAST EXCISIONAL BIOPSY Right 08/2015  . BREAST LUMPECTOMY WITH RADIOACTIVE SEED LOCALIZATION Right 09/14/2015   Procedure: RIGHT BREAST LUMPECTOMY WITH RADIOACTIVE SEED LOCALIZATION;  Surgeon: Donnie Mesa, MD;  Location: [facility];  Service: General;  Laterality: Right;  . BREAST SURGERY Bilateral    biopsy bilateral  . CATARACT EXTRACTION Bilateral    bilateral  . CHOLECYSTECTOMY    . CYST REMOVAL NECK    .  DIALYSIS FISTULA CREATION Left   . EYE SURGERY Bilateral    lazer  . LEFT HEART CATH AND CORONARY ANGIOGRAPHY N/A 03/04/2019   Procedure: LEFT HEART CATH AND CORONARY ANGIOGRAPHY;  Surgeon: Martinique, Peter M, MD;  Location: [facility];  Service: Cardiovascular;  Laterality: N/A;  . LEFT HEART CATHETERIZATION WITH CORONARY ANGIOGRAM N/A 03/30/2014   Procedure: LEFT HEART CATHETERIZATION WITH CORONARY ANGIOGRAM;  Surgeon: Sinclair Grooms, MD;  Location: [facility] CATH LAB;  Service: Cardiovascular;  Laterality: N/A;  . LIPOMA EXCISION N/A 02/06/2017   Procedure: EXCISION POSTERIOR NECK SEBACEOUS CYST;  Surgeon: Coralie Keens, MD;  Location: [facility];  Service: General;  Laterality: N/A;  . 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 REVISON OF ARTERIOVENOUS FISTULA Left 09/28/2013   Procedure: EXCISE ESCHAR LEFT ARM  ARTERIOVENOUS FISTULA WITH PLICATION OF LEFT ARM ARTERIOVENOUS FISTULA;  Surgeon: Elam Dutch, MD;  Location: [place];  Service: Vascular;  Laterality: Left;  . REVISON OF ARTERIOVENOUS FISTULA Left 08/26/2015   Procedure: RESECTION ANEURYSM OF LEFT ARM ARTERIOVENOUS FISTULA  ;  Surgeon: Serafina Mitchell, MD;  Location: [facility];  Service: Vascular;  Laterality: Left;  . TUBAL LIGATION      Social  History   Socioeconomic History  . Marital status: Legally Separated    Spouse name: Not on file  . Number of children: 3  . Years of education: 9  . Highest education level: Not on file  Occupational History  . Occupation: disabled  Tobacco Use  . Smoking status: Never Smoker  . Smokeless tobacco: Never Used  Vaping Use  . Vaping Use: Never used  Substance and Sexual Activity  . Alcohol use: Yes    Comment: occ  . Drug use: No  . Sexual activity: Yes  Other Topics Concern  . Not on file  Social History Narrative   Lives  in home with daughters, grand daughter, son-in-law   Caffeine use - 1 cup coffee sometimes    Social Determinants of Health   Financial Resource Strain:   . Difficulty of Paying Living Expenses: Not on file  Food Insecurity:   . Worried About Charity fundraiser in the Last Year: Not on file  . Ran Out of Food in the Last Year: Not on file  Transportation Needs:   . Lack of Transportation (Medical): Not on file  . Lack of Transportation (Non-Medical): Not on file  Physical Activity:   . Days of Exercise per Week: Not on file  . Minutes of Exercise per Session: Not on file  Stress:   . Feeling of Stress : Not on file  Social Connections:   . Frequency of Communication with Friends and Family: Not on file  . Frequency of Social Gatherings with Friends and Family: Not on file  . Attends Religious Services: Not on file  . Active Member of Clubs or Organizations: Not on file  . Attends Archivist Meetings: Not on file  . Marital Status: Not on file  Intimate Partner Violence:   . Fear of Current or Ex-Partner: Not on file  . Emotionally Abused: Not on file  . Physically Abused: Not on file  . Sexually Abused: Not on file    Current Outpatient Medications on File Prior to Visit  Medication Sig Dispense Refill  . amLODipine (NORVASC) 10 MG tablet Take 10 mg by mouth daily.    Marland Kitchen aspirin EC 81 MG EC tablet Take 1 tablet (81 mg total) by mouth daily. (Patient taking differently: Take 81 mg by mouth at bedtime. ) 90 tablet 3  . B Complex-C (SUPER B COMPLEX PO) Take 1 tablet by mouth daily.     . carvedilol (COREG) 25 MG tablet Take 12.5 mg by mouth 2 (two) times daily with a meal.     . cetirizine (ZYRTEC) 10 MG tablet Take 5 mg by mouth daily. Takes 0.5 tablet daily    . Cholecalciferol (VITAMIN D3) 1000 units CAPS Take 1,000 Units by mouth daily.     Marland Kitchen docusate sodium (COLACE) 100 MG capsule Take 100 mg by mouth 2 (two) times daily.    . fludrocortisone (FLORINEF) 0.1 MG  tablet Take 0.1 mg by mouth daily.    Marland Kitchen FLUoxetine (PROZAC) 40 MG capsule Take 40 mg by mouth daily.    Marland Kitchen gabapentin (NEURONTIN) 300 MG capsule Take 300 mg by mouth at bedtime.  12  . Insulin Pen Needle (PEN NEEDLES) 30G X 8 MM MISC 1 each by Does not apply route daily. E11.9 90 each 0  . magnesium oxide (MAG-OX) 400 MG tablet Take 400 mg by mouth 2 (two) times daily.     . mycophenolate (MYFORTIC) 180 MG EC tablet Take 360 mg  by mouth 2 (two) times daily.    . pantoprazole (PROTONIX) 40 MG tablet Take 40 mg by mouth daily.  5  . predniSONE (DELTASONE) 5 MG tablet Take 5 mg by mouth daily.   2  . rosuvastatin (CRESTOR) 5 MG tablet Take 5 mg by mouth at bedtime.    . Tacrolimus ER (ENVARSUS XR) 1 MG TB24 Take 3 mg by mouth every morning.    . vitamin E 400 UNIT capsule Take 400 Units by mouth daily.     No current facility-administered medications on file prior to visit.    Allergies  Allergen Reactions  . Other Rash    Burns skin.Please use paper tape only Burns skin.Please use paper tape only  . Tape Other (See Comments)    Burns skin.  Please use paper tape only    Family History  Problem Relation Age of Onset  . Cancer Maternal Grandmother   . Diabetes Maternal Grandfather   . Diabetes Paternal Grandmother   . Diabetes Mother   . Kidney disease Mother   . Diabetes Father   . Heart disease Father   . Kidney disease Father   . Diabetes Sister   . Asthma Sister   . Lupus Sister   . Diabetes Brother   . Colon cancer Neg Hx   . Colon polyps Neg Hx   . Esophageal cancer Neg Hx   . Rectal cancer Neg Hx   . Stomach cancer Neg Hx     BP (!) 144/60 (BP Location: Right Arm, Patient Position: Sitting, Cuff Size: Large)   Pulse 76   Ht 5\' 1"  (1.549 m)   Wt 187 lb 6.4 oz (85 kg)   SpO2 96%   BMI 35.41 kg/m    Review of Systems She denies hypoglycemia.      Objective:   Physical Exam VITAL SIGNS:  See vs page GENERAL: no distress Pulses: dorsalis pedis intact  bilat.   MSK: no deformity of the feet CV: trace bilat leg edema Skin:  no ulcer on the feet.  normal color and temp on the feet. Neuro: sensation is intact to touch on the feet.    Lab Results  Component Value Date   HGBA1C 9.4 (A) 03/04/2020   Lab Results  Component Value Date   CREATININE 0.90 06/06/2019   BUN 22 (H) 06/06/2019   NA 137 06/06/2019   K 4.2 06/06/2019   CL 104 06/06/2019   CO2 26 06/06/2019      Assessment & Plan:  Insulin-requiring type 2 DM, with CAD: uncontrolled.  Hypoglycemia, due to insulin: this limits aggressiveness of glycemic control   Patient Instructions  I have sent a prescription to your pharmacy, to increase the insulin to 65 units each morning. On this type of insulin schedule, you should eat meals on a regular schedule.  If a meal is missed or significantly delayed, your blood sugar could go low.  check your blood sugar once a day.  vary the time of day when you check, between before the 3 meals, and at bedtime.  also check if you have symptoms of your blood sugar being too high or too low.  please keep a record of the readings and bring it to your next appointment here (or you can bring the meter itself).  You can write it on any piece of paper.  please call us sooner if your blood sugar goes below 70, or if you have a lot of readings over 200. Please  come back for a follow-up appointment in 2 months.     He enviado una receta a su farmacia para aumentar la insulina a 65 unidades cada maana. En este tipo de horario de [place], debe comer en un horario regular. Si se salta una comida o se retrasa significativamente, su nivel de azcar en sangre podra bajar. controle su nivel de azcar en sangre una vez al da. Vare la hora del da en que lo comprueba, entre antes de las 3 comidas y antes de [place]. Compruebe tambin si tiene sntomas de que su nivel de Location manager en sangre es demasiado alto o demasiado bajo. por favor mantenga un registro de  las lecturas y Air cabin crew a su prxima cita aqu (o puede traer Nature conservation officer). Puedes escribirlo en cualquier hoja de papel. Llmenos antes si su nivel de azcar en sangre es inferior a 62 o si tiene muchas lecturas superiores a 200.  Regrese para una cita de seguimiento en 2 meses.

## 2020-03-04 NOTE — Patient Instructions (Addendum)
I have sent a prescription to your pharmacy, to increase the insulin to 65 units each morning. On this type of insulin schedule, you should eat meals on a regular schedule.  If a meal is missed or significantly delayed, your blood sugar could go low.  check your blood sugar once a day.  vary the time of day when you check, between before the 3 meals, and at bedtime.  also check if you have symptoms of your blood sugar being too high or too low.  please keep a record of the readings and bring it to your next appointment here (or you can bring the meter itself).  You can write it on any piece of paper.  please call us sooner if your blood sugar goes below 70, or if you have a lot of readings over 200. Please come back for a follow-up appointment in 2 months.     He enviado una receta a su farmacia para aumentar la insulina a 65 unidades cada maana. En este tipo de horario de [place], debe comer en un horario regular. Si se salta una comida o se retrasa significativamente, su nivel de azcar en sangre podra bajar. controle su nivel de azcar en sangre una vez al da. Vare la hora del da en que lo comprueba, entre antes de las 3 comidas y antes de [place]. Compruebe tambin si tiene sntomas de que su nivel de Location manager en sangre es demasiado alto o demasiado bajo. por favor mantenga un registro de las lecturas y Air cabin crew a su prxima cita aqu (o puede traer Nature conservation officer). Puedes escribirlo en cualquier hoja de papel. Llmenos antes si su nivel de azcar en sangre es inferior a 65 o si tiene muchas lecturas superiores a 200.  Regrese para una cita de seguimiento en 2 meses.

## 2020-05-06 ENCOUNTER — Ambulatory Visit: Payer: Medicare Other | Admitting: Endocrinology

## 2020-05-09 ENCOUNTER — Ambulatory Visit (INDEPENDENT_AMBULATORY_CARE_PROVIDER_SITE_OTHER): Payer: Medicare Other | Admitting: Endocrinology

## 2020-05-09 ENCOUNTER — Other Ambulatory Visit: Payer: Self-pay

## 2020-05-09 ENCOUNTER — Encounter: Payer: Self-pay | Admitting: Endocrinology

## 2020-05-09 VITALS — BP 158/64 | HR 74 | Ht 61.5 in | Wt 196.0 lb

## 2020-05-09 DIAGNOSIS — E1022 Type 1 diabetes mellitus with diabetic chronic kidney disease: Secondary | ICD-10-CM

## 2020-05-09 DIAGNOSIS — N186 End stage renal disease: Secondary | ICD-10-CM | POA: Diagnosis not present

## 2020-05-09 DIAGNOSIS — N185 Chronic kidney disease, stage 5: Secondary | ICD-10-CM

## 2020-05-09 DIAGNOSIS — E119 Type 2 diabetes mellitus without complications: Secondary | ICD-10-CM | POA: Diagnosis not present

## 2020-05-09 LAB — POCT GLYCOSYLATED HEMOGLOBIN (HGB A1C): Hemoglobin A1C: 11.3 % — AB (ref 4.0–5.6)

## 2020-05-09 MED ORDER — LANTUS SOLOSTAR 100 UNIT/ML ~~LOC~~ SOPN: 65.0000 [IU] | PEN_INJECTOR | Freq: Every day | SUBCUTANEOUS | 99 refills | Status: DC

## 2020-05-09 NOTE — Progress Notes (Signed)
Subjective:    Patient ID: Cynthia [name], female    DOB: [birth year], 49 y.o.   MRN: [id number]  HPI Pt returns for f/u of diabetes mellitus:  DM type: 1 Dx'ed: 4008 Complications: DR, CAD, and ESRD (transplant 2017) Therapy: insulin since soon after dx GDM: 1990 DKA: 2020 Severe hypoglycemia: last episode was mid-2019 Pancreatitis: never Pancreatic imaging: normal on 2017 CT Other: she declined pump rx, due to high copay; due to h/o noncompliance, she takes QD insulin, after poor results with multiple daily injections.   Interval history: Pt says she misses the insulin approx once per week.  She often forgets the insulin until later in the day.  no cbg record, but states cbg's vary from 40-330.  There is no trend throughout the day.  pt states she feels well in general.   Past Medical History:  Diagnosis Date  . Allergy    pollen  . Anemia    when on dialysis  . Anxiety   . Cataract    surgical repair bilateral  . Depression   . ESRD on hemodialysis ([place])    Home HD 5x per week- not on dialysis now had tramsplant 4/17  . GERD (gastroesophageal reflux disease)   . Hearing loss 2017   right ear  . History of blood transfusion    transfusion reaction  . Hyperlipidemia   . Hypertension   . 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 per pt-dr levy also II  . Kidney transplant recipient   . Neuromuscular disorder ([place])    NEUROPATHY    Past Surgical History:  Procedure Laterality Date  . ABDOMINAL HYSTERECTOMY    . BREAST EXCISIONAL BIOPSY Right 08/2015  . BREAST LUMPECTOMY WITH RADIOACTIVE SEED LOCALIZATION Right 09/14/2015   Procedure: RIGHT BREAST LUMPECTOMY WITH RADIOACTIVE SEED LOCALIZATION;  Surgeon: Donnie Mesa, MD;  Location: [facility];  Service: General;  Laterality: Right;  . BREAST SURGERY Bilateral    biopsy bilateral  . CATARACT EXTRACTION Bilateral    bilateral  . CHOLECYSTECTOMY    .  CYST REMOVAL NECK    . DIALYSIS FISTULA CREATION Left   . EYE SURGERY Bilateral    lazer  . LEFT HEART CATH AND CORONARY ANGIOGRAPHY N/A 03/04/2019   Procedure: LEFT HEART CATH AND CORONARY ANGIOGRAPHY;  Surgeon: Martinique, Peter M, MD;  Location: [facility];  Service: Cardiovascular;  Laterality: N/A;  . LEFT HEART CATHETERIZATION WITH CORONARY ANGIOGRAM N/A 03/30/2014   Procedure: LEFT HEART CATHETERIZATION WITH CORONARY ANGIOGRAM;  Surgeon: Sinclair Grooms, MD;  Location: [facility] CATH LAB;  Service: Cardiovascular;  Laterality: N/A;  . LIPOMA EXCISION N/A 02/06/2017   Procedure: EXCISION POSTERIOR NECK SEBACEOUS CYST;  Surgeon: Coralie Keens, MD;  Location: [facility];  Service: General;  Laterality: N/A;  . 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   Procedure: REPAIR OF ARTERIOVENOUS FISTULA ANEURYSM;  Surgeon: Serafina Mitchell, MD;  Location: [facility];  Service: Vascular;  Laterality: Left;  . REVISON OF ARTERIOVENOUS FISTULA Left 09/28/2013   Procedure: EXCISE ESCHAR LEFT ARM  ARTERIOVENOUS FISTULA WITH PLICATION OF LEFT ARM ARTERIOVENOUS FISTULA;  Surgeon: Elam Dutch, MD;  Location: [facility];  Service: Vascular;  Laterality: Left;  . REVISON OF ARTERIOVENOUS FISTULA Left 08/26/2015   Procedure: RESECTION ANEURYSM OF LEFT ARM ARTERIOVENOUS FISTULA  ;  Surgeon: Serafina Mitchell, MD;  Location: [facility];  Service: Vascular;  Laterality: Left;  . TUBAL  LIGATION      Social History   Socioeconomic History  . Marital status: Legally Separated    Spouse name: Not on file  . Number of children: 3  . Years of education: 9  . Highest education level: Not on file  Occupational History  . Occupation: disabled  Tobacco Use  . Smoking status: Never Smoker  . Smokeless tobacco: Never Used  Vaping Use  . Vaping Use: Never used  Substance and Sexual Activity  . Alcohol use: Yes    Comment: occ  . Drug use: No  . Sexual activity: Yes  Other Topics Concern  . Not on file  Social  History Narrative   Lives in home with daughters, grand daughter, son-in-law   Caffeine use - 1 cup coffee sometimes    Social Determinants of Health   Financial Resource Strain:   . Difficulty of Paying Living Expenses: Not on file  Food Insecurity:   . Worried About Charity fundraiser in the Last Year: Not on file  . Ran Out of Food in the Last Year: Not on file  Transportation Needs:   . Lack of Transportation (Medical): Not on file  . Lack of Transportation (Non-Medical): Not on file  Physical Activity:   . Days of Exercise per Week: Not on file  . Minutes of Exercise per Session: Not on file  Stress:   . Feeling of Stress : Not on file  Social Connections:   . Frequency of Communication with Friends and Family: Not on file  . Frequency of Social Gatherings with Friends and Family: Not on file  . Attends Religious Services: Not on file  . Active Member of Clubs or Organizations: Not on file  . Attends Archivist Meetings: Not on file  . Marital Status: Not on file  Intimate Partner Violence:   . Fear of Current or Ex-Partner: Not on file  . Emotionally Abused: Not on file  . Physically Abused: Not on file  . Sexually Abused: Not on file    Current Outpatient Medications on File Prior to Visit  Medication Sig Dispense Refill  . amLODipine (NORVASC) 10 MG tablet Take 10 mg by mouth daily.    Marland Kitchen aspirin EC 81 MG EC tablet Take 1 tablet (81 mg total) by mouth daily. (Patient taking differently: Take 81 mg by mouth at bedtime. ) 90 tablet 3  . B Complex-C (SUPER B COMPLEX PO) Take 1 tablet by mouth daily.     . carvedilol (COREG) 25 MG tablet Take 12.5 mg by mouth 2 (two) times daily with a meal.     . cetirizine (ZYRTEC) 10 MG tablet Take 5 mg by mouth daily. Takes 0.5 tablet daily    . Cholecalciferol (VITAMIN D3) 1000 units CAPS Take 1,000 Units by mouth daily.     Marland Kitchen docusate sodium (COLACE) 100 MG capsule Take 100 mg by mouth 2 (two) times daily.    .  fludrocortisone (FLORINEF) 0.1 MG tablet Take 0.1 mg by mouth daily.    Marland Kitchen FLUoxetine (PROZAC) 40 MG capsule Take 40 mg by mouth daily.    Marland Kitchen gabapentin (NEURONTIN) 300 MG capsule Take 300 mg by mouth at bedtime.  12  . glucose blood (ONETOUCH VERIO) test strip 1 each by Other route 2 (two) times daily. And lancets 2/day 200 each 3  . Insulin Pen Needle (PEN NEEDLES) 30G X 8 MM MISC 1 each by Does not apply route daily. E11.9 90 each 0  .  magnesium oxide (MAG-OX) 400 MG tablet Take 400 mg by mouth 2 (two) times daily.     . mycophenolate (MYFORTIC) 180 MG EC tablet Take 360 mg by mouth 2 (two) times daily.    . pantoprazole (PROTONIX) 40 MG tablet Take 40 mg by mouth daily.  5  . predniSONE (DELTASONE) 5 MG tablet Take 5 mg by mouth daily.   2  . rosuvastatin (CRESTOR) 5 MG tablet Take 5 mg by mouth at bedtime.    . Tacrolimus ER (ENVARSUS XR) 1 MG TB24 Take 3 mg by mouth every morning.    . vitamin E 400 UNIT capsule Take 400 Units by mouth daily.     No current facility-administered medications on file prior to visit.    Allergies  Allergen Reactions  . Other Rash    Burns skin.Please use paper tape only Burns skin.Please use paper tape only  . Tape Other (See Comments)    Burns skin.  Please use paper tape only    Family History  Problem Relation Age of Onset  . Cancer Maternal Grandmother   . Diabetes Maternal Grandfather   . Diabetes Paternal Grandmother   . Diabetes Mother   . Kidney disease Mother   . Diabetes Father   . Heart disease Father   . Kidney disease Father   . Diabetes Sister   . Asthma Sister   . Lupus Sister   . Diabetes Brother   . Colon cancer Neg Hx   . Colon polyps Neg Hx   . Esophageal cancer Neg Hx   . Rectal cancer Neg Hx   . Stomach cancer Neg Hx     BP (!) 158/64   Pulse 74   Ht 5' 1.5" (1.562 m)   Wt 196 lb (88.9 kg)   SpO2 99%   BMI 36.43 kg/m    Review of Systems Denies LOC    Objective:   Physical Exam VITAL SIGNS:  See vs  page GENERAL: no distress Pulses: dorsalis pedis intact bilat.   MSK: no deformity of the feet CV: trace bilat leg edema.   Skin:  no ulcer on the feet.  normal color and temp on the feet.   Neuro: sensation is intact to touch on the feet.    Lab Results  Component Value Date   HGBA1C 11.3 (A) 05/09/2020      Assessment & Plan:  Type 1 DM, with ESRD: uncontrolled Hypoglycemia, due to insulin: this limits aggressiveness of glycemic control.  Noncompliance with insulin: she is not a candidate for multiple daily injections.   Patient Instructions  I have sent a prescription to your pharmacy, to change the insulin to Lantus. On this type of insulin schedule, you should eat meals on a regular schedule.  If a meal is missed or significantly delayed, your blood sugar could go low.  check your blood sugar once a day.  vary the time of day when you check, between before the 3 meals, and at bedtime.  also check if you have symptoms of your blood sugar being too high or too low.  please keep a record of the readings and bring it to your next appointment here (or you can bring the meter itself).  You can write it on any piece of paper.  please call us sooner if your blood sugar goes below 70, or if you have a lot of readings over 200.   Please come back for a follow-up appointment in 2 months.

## 2020-05-09 NOTE — Patient Instructions (Addendum)
I have sent a prescription to your pharmacy, to change the insulin to Lantus. On this type of insulin schedule, you should eat meals on a regular schedule.  If a meal is missed or significantly delayed, your blood sugar could go low.  check your blood sugar once a day.  vary the time of day when you check, between before the 3 meals, and at bedtime.  also check if you have symptoms of your blood sugar being too high or too low.  please keep a record of the readings and bring it to your next appointment here (or you can bring the meter itself).  You can write it on any piece of paper.  please call us sooner if your blood sugar goes below 70, or if you have a lot of readings over 200.   Please come back for a follow-up appointment in 2 months.

## 2020-05-13 ENCOUNTER — Other Ambulatory Visit: Payer: Self-pay | Admitting: Nurse Practitioner

## 2020-05-13 DIAGNOSIS — Z940 Kidney transplant status: Secondary | ICD-10-CM

## 2020-05-30 ENCOUNTER — Ambulatory Visit
Admission: RE | Admit: 2020-05-30 | Discharge: 2020-05-30 | Disposition: A | Discharge status: Home / Self Care | Payer: Medicare Other | Source: Ambulatory Visit | Attending: Nurse Practitioner | Admitting: Nurse Practitioner

## 2020-05-30 DIAGNOSIS — Z940 Kidney transplant status: Secondary | ICD-10-CM

## 2020-07-20 ENCOUNTER — Ambulatory Visit: Payer: Medicare Other | Admitting: Endocrinology

## 2020-07-27 ENCOUNTER — Other Ambulatory Visit: Payer: Self-pay

## 2020-07-28 ENCOUNTER — Ambulatory Visit (INDEPENDENT_AMBULATORY_CARE_PROVIDER_SITE_OTHER): Payer: Medicare HMO | Admitting: Endocrinology

## 2020-07-28 VITALS — BP 156/64 | HR 74 | Ht 61.0 in | Wt 199.4 lb

## 2020-07-28 DIAGNOSIS — N185 Chronic kidney disease, stage 5: Secondary | ICD-10-CM | POA: Diagnosis not present

## 2020-07-28 DIAGNOSIS — M79672 Pain in left foot: Secondary | ICD-10-CM | POA: Diagnosis not present

## 2020-07-28 DIAGNOSIS — M79671 Pain in right foot: Secondary | ICD-10-CM | POA: Diagnosis not present

## 2020-07-28 DIAGNOSIS — E1022 Type 1 diabetes mellitus with diabetic chronic kidney disease: Secondary | ICD-10-CM | POA: Diagnosis not present

## 2020-07-28 LAB — POCT GLYCOSYLATED HEMOGLOBIN (HGB A1C): Hemoglobin A1C: 11.9 % — AB (ref 4.0–5.6)

## 2020-07-28 MED ORDER — ONETOUCH VERIO VI STRP
1.0000 | ORAL_STRIP | Freq: Two times a day (BID) | 3 refills | Status: DC
Start: 2020-07-28 — End: 2020-08-03

## 2020-07-28 NOTE — Progress Notes (Signed)
Subjective:    Patient ID: Cynthia [name], female    DOB: 1970-12-14, 50 y.o.   MRN: [id number]  HPI Pt returns for f/u of diabetes mellitus:  DM type: 1 Dx'ed: 0102 Complications: DR, CAD, PN, and ESRD (transplant 2017) Therapy: insulin since soon after dx GDM: 1990 DKA: 2020 Severe hypoglycemia: last episode was mid-2019 Pancreatitis: never Pancreatic imaging: normal on 2017 CT Other: she declined pump rx, due to high copay; due to h/o noncompliance, she takes QD insulin, after poor results with multiple daily injections.   Interval history: She often missed the insulin, due to a gap in her insurance, but she has now regained.  no cbg record, but states cbg's are well-controlled.  Pt reports pain and numbness of the feet.  Past Medical History:  Diagnosis Date  . Allergy    pollen  . Anemia    when on dialysis  . Anxiety   . Cataract    surgical repair bilateral  . Depression   . ESRD on hemodialysis ([place])    Home HD 5x per week- not on dialysis now had tramsplant 4/17  . GERD (gastroesophageal reflux disease)   . Hearing loss 2017   right ear  . History of blood transfusion    transfusion reaction  . Hyperlipidemia   . Hypertension   . 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 per pt-dr levy also II  . Kidney transplant recipient   . Neuromuscular disorder ([place])    NEUROPATHY    Past Surgical History:  Procedure Laterality Date  . ABDOMINAL HYSTERECTOMY    . BREAST EXCISIONAL BIOPSY Right 08/2015  . BREAST LUMPECTOMY WITH RADIOACTIVE SEED LOCALIZATION Right 09/14/2015   Procedure: RIGHT BREAST LUMPECTOMY WITH RADIOACTIVE SEED LOCALIZATION;  Surgeon: Donnie Mesa, MD;  Location: [place];  Service: General;  Laterality: Right;  . BREAST SURGERY Bilateral    biopsy bilateral  . CATARACT EXTRACTION Bilateral    bilateral  . CHOLECYSTECTOMY    . CYST REMOVAL NECK    . DIALYSIS FISTULA  CREATION Left   . EYE SURGERY Bilateral    lazer  . LEFT HEART CATH AND CORONARY ANGIOGRAPHY N/A 03/04/2019   Procedure: LEFT HEART CATH AND CORONARY ANGIOGRAPHY;  Surgeon: Martinique, Peter M, MD;  Location: [place] [facility];  Service: Cardiovascular;  Laterality: N/A;  . LEFT HEART CATHETERIZATION WITH CORONARY ANGIOGRAM N/A 03/30/2014   Procedure: LEFT HEART CATHETERIZATION WITH CORONARY ANGIOGRAM;  Surgeon: Sinclair Grooms, MD;  Location: [facility] CATH LAB;  Service: Cardiovascular;  Laterality: N/A;  . LIPOMA EXCISION N/A 02/06/2017   Procedure: EXCISION POSTERIOR NECK SEBACEOUS CYST;  Surgeon: Coralie Keens, MD;  Location: [place];  Service: General;  Laterality: N/A;  . 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   Procedure: REPAIR OF ARTERIOVENOUS FISTULA ANEURYSM;  Surgeon: Serafina Mitchell, MD;  Location: [place];  Service: Vascular;  Laterality: Left;  . REVISON OF ARTERIOVENOUS FISTULA Left 09/28/2013   Procedure: EXCISE ESCHAR LEFT ARM  ARTERIOVENOUS FISTULA WITH PLICATION OF LEFT ARM ARTERIOVENOUS FISTULA;  Surgeon: Elam Dutch, MD;  Location: [place];  Service: Vascular;  Laterality: Left;  . REVISON OF ARTERIOVENOUS FISTULA Left 08/26/2015   Procedure: RESECTION ANEURYSM OF LEFT ARM ARTERIOVENOUS FISTULA  ;  Surgeon: Serafina Mitchell, MD;  Location: [place];  Service: Vascular;  Laterality: Left;  . TUBAL LIGATION      Social History   Socioeconomic History  .  Marital status: Legally Separated    Spouse name: Not on file  . Number of children: 3  . Years of education: 9  . Highest education level: Not on file  Occupational History  . Occupation: disabled  Tobacco Use  . Smoking status: Never Smoker  . Smokeless tobacco: Never Used  Vaping Use  . Vaping Use: Never used  Substance and Sexual Activity  . Alcohol use: Yes    Comment: occ  . Drug use: No  . Sexual activity: Yes  Other Topics Concern  . Not on file  Social History Narrative   Lives in home with  daughters, grand daughter, son-in-law   Caffeine use - 1 cup coffee sometimes    Social Determinants of Health   Financial Resource Strain: Not on file  Food Insecurity: Not on file  Transportation Needs: Not on file  Physical Activity: Not on file  Stress: Not on file  Social Connections: Not on file  Intimate Partner Violence: Not on file    Current Outpatient Medications on File Prior to Visit  Medication Sig Dispense Refill  . amLODipine (NORVASC) 10 MG tablet Take 10 mg by mouth daily.    Marland Kitchen aspirin EC 81 MG EC tablet Take 1 tablet (81 mg total) by mouth daily. (Patient taking differently: Take 81 mg by mouth at bedtime.) 90 tablet 3  . B Complex-C (SUPER B COMPLEX PO) Take 1 tablet by mouth daily.     . carvedilol (COREG) 25 MG tablet Take 12.5 mg by mouth 2 (two) times daily with a meal.     . cetirizine (ZYRTEC) 10 MG tablet Take 5 mg by mouth daily. Takes 0.5 tablet daily    . Cholecalciferol (VITAMIN D3) 1000 units CAPS Take 1,000 Units by mouth daily.     Marland Kitchen docusate sodium (COLACE) 100 MG capsule Take 100 mg by mouth 2 (two) times daily.    . fludrocortisone (FLORINEF) 0.1 MG tablet Take 0.1 mg by mouth daily.    Marland Kitchen FLUoxetine (PROZAC) 40 MG capsule Take 40 mg by mouth daily.    Marland Kitchen gabapentin (NEURONTIN) 300 MG capsule Take 300 mg by mouth at bedtime.  12  . insulin glargine (LANTUS SOLOSTAR) 100 UNIT/ML Solostar Pen Inject 65 Units into the skin daily. 30 mL PRN  . Insulin Pen Needle (PEN NEEDLES) 30G X 8 MM MISC 1 each by Does not apply route daily. E11.9 90 each 0  . magnesium oxide (MAG-OX) 400 MG tablet Take 400 mg by mouth 2 (two) times daily.     . mycophenolate (MYFORTIC) 180 MG EC tablet Take 360 mg by mouth 2 (two) times daily.    . pantoprazole (PROTONIX) 40 MG tablet Take 40 mg by mouth daily.  5  . predniSONE (DELTASONE) 5 MG tablet Take 5 mg by mouth daily.   2  . rosuvastatin (CRESTOR) 5 MG tablet Take 5 mg by mouth at bedtime.    . Tacrolimus ER (ENVARSUS XR)  1 MG TB24 Take 3 mg by mouth every morning.    . vitamin E 400 UNIT capsule Take 400 Units by mouth daily.     No current facility-administered medications on file prior to visit.    Allergies  Allergen Reactions  . Other Rash    Burns skin.Please use paper tape only Burns skin.Please use paper tape only  . Tape Other (See Comments)    Burns skin.  Please use paper tape only    Family History  Problem Relation Age  of Onset  . Cancer Maternal Grandmother   . Diabetes Maternal Grandfather   . Diabetes Paternal Grandmother   . Diabetes Mother   . Kidney disease Mother   . Diabetes Father   . Heart disease Father   . Kidney disease Father   . Diabetes Sister   . Asthma Sister   . Lupus Sister   . Diabetes Brother   . Colon cancer Neg Hx   . Colon polyps Neg Hx   . Esophageal cancer Neg Hx   . Rectal cancer Neg Hx   . Stomach cancer Neg Hx     BP (!) 156/64 (BP Location: Right Arm, Patient Position: Sitting, Cuff Size: Large)   Pulse 74   Ht 5\' 1"  (1.549 m)   Wt 199 lb 6.4 oz (90.4 kg)   SpO2 98%   BMI 37.68 kg/m    Review of Systems She reports bilat foot pain    Objective:   Physical Exam VITAL SIGNS:  See vs page GENERAL: no distress Pulses: dorsalis pedis intact bilat.   MSK: no deformity of the feet CV: trace bilat leg edema.   Skin:  no ulcer on the feet.  normal color and temp on the feet.   Neuro: sensation is intact to touch on the feet, but decreased from normal.    Lab Results  Component Value Date   HGBA1C 11.9 (A) 07/28/2020       Assessment & Plan:  Type 1 DM, with ESRD: uncontrolled, due to SDOH Foot pain, new, uncertain etiology and prognosis.   Patient Instructions  Please continue the same Lantus. Let's check the circulation.  you will receive a phone call, about a day and time for an appointment.   On this type of insulin schedule, you should eat meals on a regular schedule.  If a meal is missed or significantly delayed, your  blood sugar could go low.  check your blood sugar once a day.  vary the time of day when you check, between before the 3 meals, and at bedtime.  also check if you have symptoms of your blood sugar being too high or too low.  please keep a record of the readings and bring it to your next appointment here (or you can bring the meter itself).  You can write it on any piece of paper.  please call us sooner if your blood sugar goes below 70, or if you have a lot of readings over 200.   Please come back for a follow-up appointment in 2 months.     Por favor contina con el mismo Lantus. Revisemos la circulacin. recibir una llamada telefnica, sobre un da y hora para una cita. En este tipo de horario de [place], debe comer en un horario regular. Si se salta una comida o se retrasa significativamente, su nivel de azcar en la sangre podra bajar. controle su nivel de azcar en la sangre una vez al da. Vare la hora del da en la que realiza la [place], entre antes de las 3 comidas y antes de [place]. tambin verifique si tiene sntomas de que su nivel de azcar en la sangre es demasiado alto o [place]. mantenga un registro de las lecturas y Air cabin crew a su prxima cita aqu (o puede traer Nature conservation officer). Puedes escribirlo en cualquier hoja de papel. llmenos antes si su nivel de azcar en la sangre es inferior a 10 o si tiene muchas lecturas superiores a 200. Vuelva para una cita de seguimiento  en 2 meses.

## 2020-07-28 NOTE — Patient Instructions (Addendum)
Please continue the same Lantus. Let's check the circulation.  you will receive a phone call, about a day and time for an appointment.   On this type of insulin schedule, you should eat meals on a regular schedule.  If a meal is missed or significantly delayed, your blood sugar could go low.  check your blood sugar once a day.  vary the time of day when you check, between before the 3 meals, and at bedtime.  also check if you have symptoms of your blood sugar being too high or too low.  please keep a record of the readings and bring it to your next appointment here (or you can bring the meter itself).  You can write it on any piece of paper.  please call us sooner if your blood sugar goes below 70, or if you have a lot of readings over 200.   Please come back for a follow-up appointment in 2 months.     Por favor contina con el mismo Lantus. Revisemos la circulacin. recibir una llamada telefnica, sobre un da y hora para una cita. En este tipo de horario de [place], debe comer en un horario regular. Si se salta una comida o se retrasa significativamente, su nivel de azcar en la sangre podra bajar. controle su nivel de azcar en la sangre una vez al da. Vare la hora del da en la que realiza la [place], entre antes de las 3 comidas y antes de [place]. tambin verifique si tiene sntomas de que su nivel de azcar en la sangre es demasiado alto o [place]. mantenga un registro de las lecturas y Air cabin crew a su prxima cita aqu (o puede traer Nature conservation officer). Puedes escribirlo en cualquier hoja de papel. llmenos antes si su nivel de azcar en la sangre es inferior a 11 o si tiene muchas lecturas superiores a 200. Vuelva para una cita de seguimiento en 2 meses.

## 2020-08-01 ENCOUNTER — Telehealth: Payer: Self-pay

## 2020-08-01 NOTE — Telephone Encounter (Signed)
Dr. Loanne Drilling,  Fax came in requesting a PA for One Touch Verio Test Strips and the pt has medicaid. I know that medicaid usually will pay for Accu-Chek meters and strips. Would it be ok to change the pt over to Accu-Chek instead of the verio bc Medicaid my not pay for it.  Please Advise

## 2020-08-01 NOTE — Telephone Encounter (Signed)
OK 

## 2020-08-03 ENCOUNTER — Other Ambulatory Visit: Payer: Self-pay

## 2020-08-03 DIAGNOSIS — N185 Chronic kidney disease, stage 5: Secondary | ICD-10-CM

## 2020-08-03 DIAGNOSIS — E1022 Type 1 diabetes mellitus with diabetic chronic kidney disease: Secondary | ICD-10-CM

## 2020-08-03 MED ORDER — ACCU-CHEK SOFTCLIX LANCETS MISC: 12 refills | Status: DC

## 2020-08-03 MED ORDER — ACCU-CHEK AVIVA PLUS VI STRP: ORAL_STRIP | 12 refills | Status: DC

## 2020-08-03 MED ORDER — ACCU-CHEK AVIVA CONNECT W/DEVICE KIT: PACK | 0 refills | Status: DC

## 2020-08-03 NOTE — Telephone Encounter (Signed)
Spoke with pt and she was ok with switching over to the Accu-Chek and it has been sent to pharmacy

## 2020-08-19 ENCOUNTER — Ambulatory Visit: Payer: Medicare HMO

## 2020-08-19 ENCOUNTER — Other Ambulatory Visit: Payer: Self-pay

## 2020-08-19 DIAGNOSIS — M79672 Pain in left foot: Secondary | ICD-10-CM

## 2020-08-19 DIAGNOSIS — M79671 Pain in right foot: Secondary | ICD-10-CM

## 2020-08-24 ENCOUNTER — Telehealth: Payer: Self-pay | Admitting: Endocrinology

## 2020-08-24 NOTE — Telephone Encounter (Signed)
Was unable to LVM bc its not set up but I sent a MyChart message requesting pt let us know who her PCP is and what has her BS readings been.

## 2020-08-24 NOTE — Telephone Encounter (Signed)
I need to know: who primary care provider is, and: How are cbg's since insulin was resumed.

## 2020-08-24 NOTE — Telephone Encounter (Signed)
Patients primary called today - they saw patient in their clinic today and wanted to provide information about patients blood sugars and A1C.  Patients A1C today was 11.5, patient reported to them blood sugars ranging between 59 and 279 the past week and 3 times in the last 7 days her blood sugars were below 70.  Patients call back number is [phone] please note need spanish interpreter

## 2020-08-24 NOTE — Telephone Encounter (Signed)
Please advise 

## 2020-09-29 ENCOUNTER — Ambulatory Visit: Payer: Medicare HMO | Admitting: Endocrinology

## 2020-11-03 ENCOUNTER — Other Ambulatory Visit: Payer: Self-pay

## 2020-11-03 ENCOUNTER — Ambulatory Visit (INDEPENDENT_AMBULATORY_CARE_PROVIDER_SITE_OTHER): Payer: Medicare HMO | Admitting: Endocrinology

## 2020-11-03 VITALS — BP 154/60 | HR 71 | Ht 61.0 in | Wt 210.8 lb

## 2020-11-03 DIAGNOSIS — E1022 Type 1 diabetes mellitus with diabetic chronic kidney disease: Secondary | ICD-10-CM | POA: Diagnosis not present

## 2020-11-03 DIAGNOSIS — N185 Chronic kidney disease, stage 5: Secondary | ICD-10-CM | POA: Diagnosis not present

## 2020-11-03 LAB — POCT GLYCOSYLATED HEMOGLOBIN (HGB A1C): Hemoglobin A1C: 11.6 % — AB (ref 4.0–5.6)

## 2020-11-03 MED ORDER — LANTUS SOLOSTAR 100 UNIT/ML ~~LOC~~ SOPN: 65.0000 [IU] | PEN_INJECTOR | SUBCUTANEOUS | 99 refills | Status: DC

## 2020-11-03 NOTE — Patient Instructions (Addendum)
Please change the Lantus to the morning.  If this does not fix the problem of the sugar being low in the morning, we'll have to change to another insulin, or add a non-insulin medication.   On this type of insulin schedule, you should eat meals on a regular schedule.  If a meal is missed or significantly delayed, your blood sugar could go low.   check your blood sugar once a day.  vary the time of day when you check, between before the 3 meals, and at bedtime.  also check if you have symptoms of your blood sugar being too high or too low.  please keep a record of the readings and bring it to your next appointment here (or you can bring the meter itself).  You can write it on any piece of paper.  please call us sooner if your blood sugar goes below 70, or if you have a lot of readings over 200.    Please come back for a follow-up appointment in 2 months.     Cambie el Lantus a la maana. Si esto no soluciona el problema de que el azcar est bajo por la maana, tendremos que cambiar a otra insulina o agregar un medicamento que no sea insulina. En este tipo de horario de [place], debe comer en un horario regular. Si se salta una comida o se retrasa significativamente, su nivel de azcar en la sangre podra bajar. controle su nivel de azcar en la sangre una vez al da. Vare la hora del da en la que realiza la [place], entre antes de las 3 comidas y antes de [place]. tambin verifique si tiene sntomas de que su nivel de azcar en la sangre es demasiado alto o [place]. mantenga un registro de las lecturas y Air cabin crew a su prxima cita aqu (o puede traer Nature conservation officer). Puedes escribirlo en cualquier hoja de papel. llmenos antes si su nivel de azcar en la sangre es inferior a 71 o si tiene muchas lecturas superiores a 200. Vuelva para una cita de seguimiento en 2 meses

## 2020-11-03 NOTE — Progress Notes (Signed)
Subjective:    Patient ID: Cynthia [name], female    DOB: 1970-07-28, 50 y.o.   MRN: [id number]  HPI Pt returns for f/u of diabetes mellitus:  DM type: 1 Dx'ed: 3254 Complications: DR, CAD, PN, and ESRD (transplant 2017).   Therapy: insulin since soon after dx GDM: 1990 DKA: 2020 Severe hypoglycemia: last episode was mid-2019.   Pancreatitis: never Pancreatic imaging: normal on 2017 CT.   Other: she declined pump rx, due to high copay; due to h/o noncompliance, she takes QD insulin, after poor results with multiple daily injections.   Interval history: pt says she never misses the insulin.  She says Lantus is 65 units QHS.  no cbg record, but states cbg varies from 49-240.  It is in general higher as the day.  Pt says she gets a low copay on the Lantus.   Past Medical History:  Diagnosis Date  . Allergy    pollen  . Anemia    when on dialysis  . Anxiety   . Cataract    surgical repair bilateral  . Depression   . ESRD on hemodialysis ([place])    Home HD 5x per week- not on dialysis now had tramsplant 4/17  . GERD (gastroesophageal reflux disease)   . Hearing loss 2017   right ear  . History of blood transfusion    transfusion reaction  . Hyperlipidemia   . Hypertension   . 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 per pt-dr levy also II  . Kidney transplant recipient   . Neuromuscular disorder ([place])    NEUROPATHY    Past Surgical History:  Procedure Laterality Date  . ABDOMINAL HYSTERECTOMY    . BREAST EXCISIONAL BIOPSY Right 08/2015  . BREAST LUMPECTOMY WITH RADIOACTIVE SEED LOCALIZATION Right 09/14/2015   Procedure: RIGHT BREAST LUMPECTOMY WITH RADIOACTIVE SEED LOCALIZATION;  Surgeon: Donnie Mesa, MD;  Location: [place];  Service: General;  Laterality: Right;  . BREAST SURGERY Bilateral    biopsy bilateral  . CATARACT EXTRACTION Bilateral    bilateral  . CHOLECYSTECTOMY    . CYST REMOVAL NECK     . DIALYSIS FISTULA CREATION Left   . EYE SURGERY Bilateral    lazer  . LEFT HEART CATH AND CORONARY ANGIOGRAPHY N/A 03/04/2019   Procedure: LEFT HEART CATH AND CORONARY ANGIOGRAPHY;  Surgeon: Martinique, Peter M, MD;  Location: [place] [facility];  Service: Cardiovascular;  Laterality: N/A;  . LEFT HEART CATHETERIZATION WITH CORONARY ANGIOGRAM N/A 03/30/2014   Procedure: LEFT HEART CATHETERIZATION WITH CORONARY ANGIOGRAM;  Surgeon: Sinclair Grooms, MD;  Location: [facility]ATH [facility];  Service: Cardiovascular;  Laterality: N/A;  . LIPOMA EXCISION N/A 02/06/2017   Procedure: EXCISION POSTERIOR NECK SEBACEOUS CYST;  Surgeon: Coralie Keens, MD;  Location: [place];  Service: General;  Laterality: N/A;  . 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   Procedure: REPAIR OF ARTERIOVENOUS FISTULA ANEURYSM;  Surgeon: Serafina Mitchell, MD;  Location: [place];  Service: Vascular;  Laterality: Left;  . REVISON OF ARTERIOVENOUS FISTULA Left 09/28/2013   Procedure: EXCISE ESCHAR LEFT ARM  ARTERIOVENOUS FISTULA WITH PLICATION OF LEFT ARM ARTERIOVENOUS FISTULA;  Surgeon: Elam Dutch, MD;  Location: [place];  Service: Vascular;  Laterality: Left;  . REVISON OF ARTERIOVENOUS FISTULA Left 08/26/2015   Procedure: RESECTION ANEURYSM OF LEFT ARM ARTERIOVENOUS FISTULA  ;  Surgeon: Serafina Mitchell, MD;  Location: [place];  Service: Vascular;  Laterality: Left;  . TUBAL LIGATION      Social History   Socioeconomic History  . Marital status: Legally Separated    Spouse name: Not on file  . Number of children: 3  . Years of education: 9  . Highest education level: Not on file  Occupational History  . Occupation: disabled  Tobacco Use  . Smoking status: Never Smoker  . Smokeless tobacco: Never Used  Vaping Use  . Vaping Use: Never used  Substance and Sexual Activity  . Alcohol use: Yes    Comment: occ  . Drug use: No  . Sexual activity: Yes  Other Topics Concern  . Not on file  Social History Narrative    Lives in home with daughters, grand daughter, son-in-law   Caffeine use - 1 cup coffee sometimes    Social Determinants of Health   Financial Resource Strain: Not on file  Food Insecurity: Not on file  Transportation Needs: Not on file  Physical Activity: Not on file  Stress: Not on file  Social Connections: Not on file  Intimate Partner Violence: Not on file    Current Outpatient Medications on File Prior to Visit  Medication Sig Dispense Refill  . Accu-Chek Softclix Lancets lancets Use as instructed 100 each 12  . amLODipine (NORVASC) 10 MG tablet Take 10 mg by mouth daily.    Marland Kitchen aspirin EC 81 MG EC tablet Take 1 tablet (81 mg total) by mouth daily. (Patient taking differently: Take 81 mg by mouth at bedtime.) 90 tablet 3  . B Complex-C (SUPER B COMPLEX PO) Take 1 tablet by mouth daily.     . Blood Glucose Monitoring Suppl (ACCU-CHEK AVIVA CONNECT) w/Device KIT Check sugar 1x daily 1 kit 0  . carvedilol (COREG) 25 MG tablet Take 12.5 mg by mouth 2 (two) times daily with a meal.     . cetirizine (ZYRTEC) 10 MG tablet Take 5 mg by mouth daily. Takes 0.5 tablet daily    . Cholecalciferol (VITAMIN D3) 1000 units CAPS Take 1,000 Units by mouth daily.     Marland Kitchen docusate sodium (COLACE) 100 MG capsule Take 100 mg by mouth 2 (two) times daily.    . fludrocortisone (FLORINEF) 0.1 MG tablet Take 0.1 mg by mouth daily.    Marland Kitchen FLUoxetine (PROZAC) 40 MG capsule Take 40 mg by mouth daily.    Marland Kitchen gabapentin (NEURONTIN) 300 MG capsule Take 300 mg by mouth at bedtime.  12  . glucose blood (ACCU-CHEK AVIVA PLUS) test strip Check sugar 1x daily 100 each 12  . Insulin Pen Needle (PEN NEEDLES) 30G X 8 MM MISC 1 each by Does not apply route daily. E11.9 90 each 0  . magnesium oxide (MAG-OX) 400 MG tablet Take 400 mg by mouth 2 (two) times daily.     . mycophenolate (MYFORTIC) 180 MG EC tablet Take 360 mg by mouth 2 (two) times daily.    . pantoprazole (PROTONIX) 40 MG tablet Take 40 mg by mouth daily.  5  .  predniSONE (DELTASONE) 5 MG tablet Take 5 mg by mouth daily.   2  . rosuvastatin (CRESTOR) 5 MG tablet Take 5 mg by mouth at bedtime.    . Tacrolimus ER (ENVARSUS XR) 1 MG TB24 Take 3 mg by mouth every morning.    . vitamin E 400 UNIT capsule Take 400 Units by mouth daily.     No current facility-administered medications on file prior to visit.    Allergies  Allergen Reactions  .  Other Rash    Burns skin.Please use paper tape only Burns skin.Please use paper tape only  . Tape Other (See Comments)    Burns skin.  Please use paper tape only    Family History  Problem Relation Age of Onset  . Cancer Maternal Grandmother   . Diabetes Maternal Grandfather   . Diabetes Paternal Grandmother   . Diabetes Mother   . Kidney disease Mother   . Diabetes Father   . Heart disease Father   . Kidney disease Father   . Diabetes Sister   . Asthma Sister   . Lupus Sister   . Diabetes Brother   . Colon cancer Neg Hx   . Colon polyps Neg Hx   . Esophageal cancer Neg Hx   . Rectal cancer Neg Hx   . Stomach cancer Neg Hx     BP (!) 154/60 (BP Location: Right Arm, Patient Position: Sitting, Cuff Size: Large)   Pulse 71   Ht '5\' 1"'  (1.549 m)   Wt 210 lb 12.8 oz (95.6 kg)   SpO2 97%   BMI 39.83 kg/m    Review of Systems Denies LOC    Objective:   Physical Exam VITAL SIGNS:  See vs page GENERAL: no distress Pulses: dorsalis pedis intact bilat.   MSK: no deformity of the feet.   CV: trace bilat leg edema.   Skin:  no ulcer on the feet.  normal color and temp on the feet.   Neuro: sensation is intact to touch on the feet, but decreased from normal.     Lab Results  Component Value Date   HGBA1C 11.6 (A) 11/03/2020       Assessment & Plan:  Type 1 DM: uncontrolled.    Patient Instructions  Please change the Lantus to the morning.  If this does not fix the problem of the sugar being low in the morning, we'll have to change to another insulin, or add a non-insulin  medication.   On this type of insulin schedule, you should eat meals on a regular schedule.  If a meal is missed or significantly delayed, your blood sugar could go low.   check your blood sugar once a day.  vary the time of day when you check, between before the 3 meals, and at bedtime.  also check if you have symptoms of your blood sugar being too high or too low.  please keep a record of the readings and bring it to your next appointment here (or you can bring the meter itself).  You can write it on any piece of paper.  please call us sooner if your blood sugar goes below 70, or if you have a lot of readings over 200.    Please come back for a follow-up appointment in 2 months.     Cambie el Lantus a la maana. Si esto no soluciona el problema de que el azcar est bajo por la maana, tendremos que cambiar a otra insulina o agregar un medicamento que no sea insulina. En este tipo de horario de [place], debe comer en un horario regular. Si se salta una comida o se retrasa significativamente, su nivel de azcar en la sangre podra bajar. controle su nivel de azcar en la sangre una vez al da. Vare la hora del da en la que realiza la [place], entre antes de las 3 comidas y antes de [place]. tambin verifique si tiene sntomas de que su nivel de Dispensing optician es  demasiado alto o demasiado b[place]. mantenga un registro de las lecturas y Air cabin crew a su prxima cita aqu (o puede traer Nature conservation officer). Puedes escribirlo en cualquier hoja de papel. llmenos antes si su nivel de azcar en la sangre es inferior a 49 o si tiene muchas lecturas superiores a 200. Vuelva para una cita de seguimiento en 2 meses

## 2021-01-09 ENCOUNTER — Ambulatory Visit: Payer: PRIVATE HEALTH INSURANCE | Admitting: Endocrinology

## 2021-02-09 ENCOUNTER — Other Ambulatory Visit: Payer: Self-pay

## 2021-02-13 ENCOUNTER — Encounter: Payer: Self-pay | Admitting: Registered Nurse

## 2021-02-15 ENCOUNTER — Encounter: Payer: Self-pay | Admitting: *Deleted

## 2021-02-15 DIAGNOSIS — C50312 Malignant neoplasm of lower-inner quadrant of left female breast: Secondary | ICD-10-CM | POA: Insufficient documentation

## 2021-02-21 NOTE — Progress Notes (Signed)
Radiation Oncology         [phone] ________________________________  Initial Outpatient Consultation  Name: Cynthia [name] MRN: [id number]  Date: 02/22/2021  DOB: [birth year]  CC:Pcp, No  Erroll Luna, MD   REFERRING PHYSICIAN: Erroll Luna, MD  DIAGNOSIS:    ICD-10-CM   1. 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 female who presented with an indeterminate, 1.6 cm irregular left breast mass on the following imaging: Diagnostic left breast mammogram on the date of 02/07/21.  No symptoms, if any, were reported at that time.  Ultrasound of the left breast on 02/07/21 revealed the 1.6 cm mass to be highly suggestive of malignancy; located at the 6:30 o'clock position, 8 cmfn.  On ultrasound her axilla was negative.  Left breast needle core biopsy on date of 02/09/21 showed invasive ductal carcinoma with histiocytoid features; carcinoma noted to measure 1 cm in the greatest linear dimension.  ER status: >95%, positive; PR status 90% positive, both with strong staining intensity, Her2 status negative; Grade 20%; Grade 2.  She is here with her daughter and a Optometrist today.  She underwent a kidney transplant in 2017.  She still has a fistula in her arm but is not currently on dialysis  PREVIOUS RADIATION THERAPY: No  PAST  MEDICAL HISTORY:  has a past medical history of Allergy, Anemia, Anxiety, Breast cancer ([place]), Cataract, Depression, ESRD on hemodialysis ([place]), GERD (gastroesophageal reflux disease), Hearing loss (2017), History of blood transfusion, Hyperlipidemia, Hypertension, Insulin-dependent diabetes mellitus with renal complications, Kidney transplant recipient, and Neuromuscular disorder ([place]).    PAST SURGICAL HISTORY: Past Surgical History:  Procedure Laterality Date   ABDOMINAL HYSTERECTOMY     BREAST EXCISIONAL BIOPSY Right 08/2015   BREAST LUMPECTOMY WITH RADIOACTIVE SEED LOCALIZATION Right 09/14/2015   Procedure: RIGHT BREAST LUMPECTOMY WITH RADIOACTIVE SEED LOCALIZATION;  Surgeon: Donnie Mesa, MD;  Location: [facility];  Service: General;  Laterality: Right;   BREAST SURGERY Bilateral    biopsy bilateral   CATARACT EXTRACTION Bilateral    bilateral   CHOLECYSTECTOMY     CYST REMOVAL NECK     DIALYSIS FISTULA CREATION Left    EYE SURGERY Bilateral    lazer   LEFT HEART CATH AND CORONARY ANGIOGRAPHY N/A 03/04/2019   Procedure: LEFT HEART CATH AND CORONARY ANGIOGRAPHY;  Surgeon: Martinique, Peter M, MD;  Location: [facility];  Service: Cardiovascular;  Laterality: N/A;   LEFT HEART CATHETERIZATION WITH CORONARY ANGIOGRAM N/A 03/30/2014   Procedure: LEFT HEART CATHETERIZATION WITH CORONARY ANGIOGRAM;  Surgeon: Sinclair Grooms, MD;  Location: [facility] CATH LAB;  Service: Cardiovascular;  Laterality: N/A;   LIPOMA EXCISION N/A 02/06/2017   Procedure: EXCISION POSTERIOR NECK SEBACEOUS CYST;  Surgeon: Coralie Keens, MD;  Location: [facility];  Service: General;  Laterality: N/A;   RESECTION OF ARTERIOVENOUS FISTULA  ANEURYSM Left 07/07/2015   Procedure: REPAIR OF ARTERIOVENOUS FISTULA ANEURYSM;  Surgeon: Serafina Mitchell, MD;  Location: [facility];  Service: Vascular;  Laterality: Left;   REVISON OF ARTERIOVENOUS FISTULA Left 09/28/2013   Procedure: EXCISE ESCHAR LEFT ARM  ARTERIOVENOUS FISTULA  WITH PLICATION OF LEFT ARM ARTERIOVENOUS FISTULA;  Surgeon: Elam Dutch, MD;  Location: [facility];  Service: Vascular;  Laterality: Left;   REVISON OF ARTERIOVENOUS FISTULA Left 08/26/2015   Procedure: RESECTION ANEURYSM OF LEFT ARM ARTERIOVENOUS FISTULA  ;  Surgeon: Serafina Mitchell, MD;  Location: [facility] OR;  Service: Vascular;  Laterality: Left;   TUBAL LIGATION      FAMILY HISTORY: family history includes Asthma in her sister; Cancer in her maternal grandmother; Diabetes in her brother, brother, brother, brother, father, maternal grandfather, mother, paternal grandmother, and sister; Heart disease in her father; Hypertension in her father; Kidney disease in her father and mother; Lupus in her sister.  SOCIAL HISTORY:  reports that she has never smoked. She has never used smokeless tobacco. She reports current alcohol use. She reports that she does not use drugs.  ALLERGIES: Other and Tape  MEDICATIONS:  Current Outpatient Medications  Medication Sig Dispense Refill   Accu-Chek Softclix Lancets lancets Use as instructed 100 each 12   amLODipine (NORVASC) 10 MG tablet Take 10 mg by mouth daily.     aspirin EC 81 MG EC tablet Take 1 tablet (81 mg total) by mouth daily. (Patient taking differently: Take 81 mg by mouth at bedtime.) 90 tablet 3   B Complex-C (SUPER B COMPLEX PO) Take 1 tablet by mouth daily.      Blood Glucose Monitoring Suppl (ACCU-CHEK AVIVA CONNECT) w/Device KIT Check sugar 1x daily 1 kit 0   carvedilol (COREG) 25 MG tablet Take 12.5 mg by mouth 2 (two) times daily with a meal.      cetirizine (ZYRTEC) 10 MG tablet Take 5 mg by mouth daily. Takes 0.5 tablet daily     Cholecalciferol (VITAMIN D3) 1000 units CAPS Take 1,000 Units by mouth daily.      docusate sodium (COLACE) 100 MG capsule Take 100 mg by mouth 2 (two) times daily.     fludrocortisone (FLORINEF) 0.1 MG tablet Take 0.1 mg by mouth daily.     FLUoxetine (PROZAC) 40 MG capsule Take 40 mg by mouth daily.     gabapentin  (NEURONTIN) 300 MG capsule Take 300 mg by mouth at bedtime.  12   glucose blood (ACCU-CHEK AVIVA PLUS) test strip Check sugar 1x daily 100 each 12   insulin glargine (LANTUS SOLOSTAR) 100 UNIT/ML Solostar Pen Inject 65 Units into the skin every morning. 75 mL PRN   Insulin Pen Needle (PEN NEEDLES) 30G X 8 MM MISC 1 each by Does not apply route daily. E11.9 90 each 0   magnesium oxide (MAG-OX) 400 MG tablet Take 400 mg by mouth 2 (two) times daily.      mycophenolate (MYFORTIC) 180 MG EC tablet Take 360 mg by mouth 2 (two) times daily.     pantoprazole (PROTONIX) 40 MG tablet Take 40 mg by mouth daily.  5   predniSONE (DELTASONE) 5 MG tablet Take 5 mg by mouth daily.   2   rosuvastatin (CRESTOR) 5 MG tablet Take 5 mg by mouth at bedtime.     Tacrolimus ER (ENVARSUS XR) 1 MG TB24 Take 3 mg by mouth every morning.     vitamin E 400 UNIT capsule Take 400 Units by  mouth daily.     No current facility-administered medications for this encounter.    REVIEW OF SYSTEMS: As above  PHYSICAL EXAM:  vitals were not taken for this visit.   General: Alert and oriented, in no acute distress HEENT: Head is normocephalic. Extraocular movements are intact.  Heart: Murmur present, regular in rate and rhythm. Chest: Clear to auscultation bilaterally, with no rhonchi, wheezes, or rales. Extremities: dialysis fistula present in left arm ; no edema in extremities skin: No concerning lesions. Neurologic: No obvious focalities. Speech is fluent. Coordination is intact. Psychiatric: Judgment and insight are intact. Affect is appropriate. Breasts: No palpable masses appreciated in her breasts or axilla bilaterally.    ECOG = 1  0 - Asymptomatic (Fully active, able to carry on all predisease activities without restriction)  1 - Symptomatic but completely ambulatory (Restricted in physically strenuous activity but ambulatory and able to carry out work of a light or sedentary nature. For example, light housework,  office work)  2 - Symptomatic, <50% in bed during the day (Ambulatory and capable of all self care but unable to carry out any work activities. Up and about more than 50% of waking hours)  3 - Symptomatic, >50% in bed, but not bedbound (Capable of only limited self-care, confined to bed or chair 50% or more of waking hours)  4 - Bedbound (Completely disabled. Cannot carry on any self-care. Totally confined to bed or chair)  5 - Death   Eustace Pen MM, Creech RH, Tormey DC, et al. [phone]). "Toxicity and response criteria of the [employer]". [place] Oncol. 5 (6): 649-55   LABORATORY DATA:  Lab Results  Component Value Date   WBC 6.0 02/22/2021   HGB 13.7 02/22/2021   HCT 40.0 02/22/2021   MCV 88.1 02/22/2021   PLT 168 02/22/2021   CMP     Component Value Date/Time   NA 141 02/22/2021 0848   NA 136 02/27/2019 1152   K 4.2 02/22/2021 0848   CL 108 02/22/2021 0848   CO2 25 02/22/2021 0848   GLUCOSE 67 (L) 02/22/2021 0848   BUN 23 (H) 02/22/2021 0848   BUN 19 02/27/2019 1152   CREATININE 1.27 (H) 02/22/2021 0848   CALCIUM 10.6 (H) 02/22/2021 0848   PROT 6.8 02/22/2021 0848   ALBUMIN 3.8 02/22/2021 0848   AST 32 02/22/2021 0848   ALT 58 (H) 02/22/2021 0848   ALKPHOS 92 02/22/2021 0848   BILITOT 0.4 02/22/2021 0848   GFRNONAA 51 (L) 02/22/2021 0848   GFRAA >60 06/06/2019 0250         RADIOGRAPHY: As above     IMPRESSION/PLAN: Early stage left breast cancer  She has been discussed at our multidisciplinary tumor board.  The consensus is that she would be a good candidate for breast conservation. I talked to her about the option of a mastectomy and informed her that her expected overall survival would be equivalent between mastectomy and breast conservation, based upon randomized controlled data. She is enthusiastic about breast conservation.  It was a pleasure meeting the patient today. We discussed the risks, benefits, and side effects of  radiotherapy. I recommend radiotherapy to the to the left breast to reduce her risk of locoregional recurrence by 2/3.  We discussed that radiation would take approximately 4-6 weeks to complete and that I would give the patient a few weeks to heal following surgery before starting treatment planning.  If chemotherapy were to be given, this would precede radiotherapy.  We spoke about acute effects including skin irritation and fatigue as well as much less common late effects including internal organ injury or irritation. We spoke about the latest technology that is used to minimize the risk of late effects for patients undergoing radiotherapy to the breast or chest wall. No guarantees of treatment were given. The patient is enthusiastic about proceeding with treatment. I look forward to participating in the patient's care.  I will await her referral back to me for postoperative follow-up and eventual CT simulation/treatment planning.  She will also be referred to genetics..  On date of service, in total, I spent 45 minutes on this encounter. Patient was seen in person.   __________________________________________   Eppie Gibson, MD  This document serves as a record of services personally performed by Eppie Gibson, MD. It was created on her behalf by Roney Mans, a trained medical scribe. The creation of this record is based on the scribe's personal observations and the provider's statements to them. This document has been checked and approved by the attending provider.

## 2021-02-22 ENCOUNTER — Ambulatory Visit
Admission: RE | Admit: 2021-02-22 | Discharge: 2021-02-22 | Disposition: A | Discharge status: Home / Self Care | Payer: Medicare HMO | Source: Ambulatory Visit | Attending: Radiation Oncology | Admitting: Radiation Oncology

## 2021-02-22 ENCOUNTER — Encounter: Payer: Self-pay | Admitting: Hematology

## 2021-02-22 ENCOUNTER — Encounter: Payer: Self-pay | Admitting: *Deleted

## 2021-02-22 ENCOUNTER — Ambulatory Visit (HOSPITAL_BASED_OUTPATIENT_CLINIC_OR_DEPARTMENT_OTHER): Payer: Medicare HMO | Admitting: Genetic Counselor

## 2021-02-22 ENCOUNTER — Inpatient Hospital Stay: Payer: Medicare HMO

## 2021-02-22 ENCOUNTER — Encounter: Payer: Self-pay | Admitting: Radiation Oncology

## 2021-02-22 ENCOUNTER — Encounter: Payer: Self-pay | Admitting: Physical Therapy

## 2021-02-22 ENCOUNTER — Other Ambulatory Visit: Payer: Self-pay

## 2021-02-22 ENCOUNTER — Inpatient Hospital Stay: Payer: Medicare HMO | Admitting: Licensed Clinical Social Worker

## 2021-02-22 ENCOUNTER — Ambulatory Visit: Payer: Medicare HMO | Attending: Surgery | Admitting: Physical Therapy

## 2021-02-22 ENCOUNTER — Ambulatory Visit: Payer: Self-pay | Admitting: Surgery

## 2021-02-22 ENCOUNTER — Encounter: Payer: Medicare HMO | Admitting: Genetic Counselor

## 2021-02-22 ENCOUNTER — Inpatient Hospital Stay: Payer: Medicare HMO | Attending: Hematology | Admitting: Hematology

## 2021-02-22 ENCOUNTER — Encounter: Payer: Self-pay | Admitting: Genetic Counselor

## 2021-02-22 VITALS — BP 125/51 | HR 62 | Temp 97.6°F | Resp 18 | Ht 61.0 in | Wt 210.0 lb

## 2021-02-22 DIAGNOSIS — C50312 Malignant neoplasm of lower-inner quadrant of left female breast: Secondary | ICD-10-CM

## 2021-02-22 DIAGNOSIS — Z170 Estrogen receptor positive status [ER+]: Secondary | ICD-10-CM

## 2021-02-22 DIAGNOSIS — Z8616 Personal history of COVID-19: Secondary | ICD-10-CM | POA: Insufficient documentation

## 2021-02-22 DIAGNOSIS — Z8041 Family history of malignant neoplasm of ovary: Secondary | ICD-10-CM | POA: Insufficient documentation

## 2021-02-22 DIAGNOSIS — Z794 Long term (current) use of insulin: Secondary | ICD-10-CM | POA: Diagnosis not present

## 2021-02-22 DIAGNOSIS — Z940 Kidney transplant status: Secondary | ICD-10-CM | POA: Diagnosis not present

## 2021-02-22 DIAGNOSIS — R293 Abnormal posture: Secondary | ICD-10-CM | POA: Insufficient documentation

## 2021-02-22 DIAGNOSIS — C50912 Malignant neoplasm of unspecified site of left female breast: Secondary | ICD-10-CM

## 2021-02-22 DIAGNOSIS — I10 Essential (primary) hypertension: Secondary | ICD-10-CM | POA: Diagnosis not present

## 2021-02-22 DIAGNOSIS — E1140 Type 2 diabetes mellitus with diabetic neuropathy, unspecified: Secondary | ICD-10-CM | POA: Diagnosis not present

## 2021-02-22 HISTORY — DX: Family history of malignant neoplasm of ovary: Z80.41

## 2021-02-22 LAB — CBC WITH DIFFERENTIAL (CANCER CENTER ONLY)
Abs Immature Granulocytes: 0.03 10*3/uL (ref 0.00–0.07)
Basophils Absolute: 0 10*3/uL (ref 0.0–0.1)
Basophils Relative: 1 %
Eosinophils Absolute: 0.3 10*3/uL (ref 0.0–0.5)
Eosinophils Relative: 5 %
HCT: 40 % (ref 36.0–46.0)
Hemoglobin: 13.7 g/dL (ref 12.0–15.0)
Immature Granulocytes: 1 %
Lymphocytes Relative: 34 %
Lymphs Abs: 2 10*3/uL (ref 0.7–4.0)
MCH: 30.2 pg (ref 26.0–34.0)
MCHC: 34.3 g/dL (ref 30.0–36.0)
MCV: 88.1 fL (ref 80.0–100.0)
Monocytes Absolute: 0.5 10*3/uL (ref 0.1–1.0)
Monocytes Relative: 9 %
Neutro Abs: 3.1 10*3/uL (ref 1.7–7.7)
Neutrophils Relative %: 50 %
Platelet Count: 168 10*3/uL (ref 150–400)
RBC: 4.54 MIL/uL (ref 3.87–5.11)
RDW: 12 % (ref 11.5–15.5)
WBC Count: 6 10*3/uL (ref 4.0–10.5)
nRBC: 0 % (ref 0.0–0.2)

## 2021-02-22 LAB — CMP (CANCER CENTER ONLY)
ALT: 58 U/L — ABNORMAL HIGH (ref 0–44)
AST: 32 U/L (ref 15–41)
Albumin: 3.8 g/dL (ref 3.5–5.0)
Alkaline Phosphatase: 92 U/L (ref 38–126)
Anion gap: 8 (ref 5–15)
BUN: 23 mg/dL — ABNORMAL HIGH (ref 6–20)
CO2: 25 mmol/L (ref 22–32)
Calcium: 10.6 mg/dL — ABNORMAL HIGH (ref 8.9–10.3)
Chloride: 108 mmol/L (ref 98–111)
Creatinine: 1.27 mg/dL — ABNORMAL HIGH (ref 0.44–1.00)
GFR, Estimated: 51 mL/min — ABNORMAL LOW (ref >60)
Glucose, Bld: 67 mg/dL — ABNORMAL LOW (ref 70–99)
Potassium: 4.2 mmol/L (ref 3.5–5.1)
Sodium: 141 mmol/L (ref 135–145)
Total Bilirubin: 0.4 mg/dL (ref 0.3–1.2)
Total Protein: 6.8 g/dL (ref 6.5–8.1)

## 2021-02-22 LAB — GENETIC SCREENING ORDER

## 2021-02-22 NOTE — Progress Notes (Signed)
Cave Work  Initial Assessment   Cynthia [name] is a 50 y.o. year old female accompanied by daughter. Clinical Social Work was referred by [facility] for assessment of psychosocial needs.  BSW intern, A. Owens Shark, and interpreter, Verdis Frederickson, present for visit.  SDOH (Social Determinants of Health) assessments performed: Yes SDOH Interventions    Flowsheet Row Most Recent Value  SDOH Interventions   Food Insecurity Interventions Other (Comment)  [place] food pantry]  Financial Strain Interventions Other (Comment)  [Cancer Foundation]  Housing Interventions Other (Comment)  [Breast cancer foundations]  Transportation Interventions Intervention Not Indicated       Distress Screen completed: Yes ONCBCN DISTRESS SCREENING 02/22/2021  Distress experienced in past week (1-10) 6  Practical problem type Housing;Food  Information Concerns Type Lack of info about maintaining fitness  Physical Problem type Pain;Tingling hands/feet;Skin dry/itchy      Family/Social Information:  Housing Arrangement: patient lives with partner and has 3 daughters nearby Family members/support persons in your life? Family and [employer] concerns: no  Employment: Disabled. Income source: Social Security Disability and partner's employment Financial concerns: Yes, current concerns Type of concern: Utilities, Rent/ mortgage, Medical bills, and Food Food access concerns: general cost concerns Religious or spiritual practice: yes, attends church Medication Concerns: yes, cost  Services Currently in place:  social security disability, insurance  Coping/ Adjustment to diagnosis: Patient understands treatment plan and what happens next? yes Concerns about diagnosis and/or treatment: Overwhelmed by information and How I will pay for the services I need Patient reported stressors: Housing, Publishing rights manager, Haematologist, and Adjusting to my illness Patient enjoys  She loves flowers Current coping skills/ strengths:  Active sense of humor , Capable of independent living , Motivation for treatment/growth , Religious Affiliation , and Supportive family/friends     SUMMARY: Current SDOH Barriers:  Financial constraints related to other medical issues  Clinical Social Work Clinical Goal(s):  patient will work with SW to address concerns related to finances  Interventions: Discussed common feeling and emotions when being diagnosed with cancer, and the importance of support during treatment Informed patient of the support team roles and support services at [facility] Provided [place] contact information and encouraged patient to call with any questions or concerns Provided patient with information about cancer foundations, food pantry, advance directives   Follow Up Plan: Patient will work on applications for assistance and contact CSW with any questions Patient verbalizes understanding of plan: Yes    Christeen Douglas LCSW

## 2021-02-22 NOTE — Progress Notes (Signed)
[place]   [phone] Fax:([phone]) [place] Note   Patient Care Team: Pcp, No as PCP - General Rexene Agent, MD as Consulting Physician (Nephrology) Dwana Melena, MD as Referring Physician (Nephrology) Rockwell Germany, RN as Oncology Nurse Navigator Mauro Kaufmann, RN as Oncology Nurse Navigator Truitt Merle, MD as Consulting Physician (Hematology) Erroll Luna, MD as Consulting Physician (General Surgery) Eppie Gibson, MD as Attending Physician (Radiation Oncology)  Date of Service:  02/22/2021   CHIEF COMPLAINTS/PURPOSE OF CONSULTATION:  Left Breast Cancer, ER+  REFERRING PHYSICIAN:  [facility]   ASSESSMENT & PLAN:  Cynthia [name] is a 50 y.o.  female with a history of DM, HTN, history of ESRD s/p kidney transplant  1. 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 ---We discussed her imaging findings and the biopsy results in great details. -Given the early stage disease, she likely need a lumpectomy. She is agreeable with that. She was seen by Dr. Brantley Stage today and likely will proceed with surgery soon.  -I recommend a Oncotype Dx test on the surgical sample and we'll make a decision about adjuvant chemotherapy based on the Oncotype result. Written material of this test was given to her. Despite her medical history, I would still recommend chemotherapy if her Oncotype recurrence score is high given her young age. -If her surgical sentinel lymph node node positive, I recommend mammaprint for further risk stratification and guide adjuvant chemotherapy. -Giving the strong ER and PR expression, I recommend adjuvant endocrine therapy with aromatase inhibitor or tamoxifen for a total of 5-10 years to reduce the risk of cancer recurrence. We will perform blood work at follow  up to determine if she is postmenopausal. Potential benefits and side effects were discussed with patient and she is interested. -She was also seen by radiation oncologist Dr. Isidore Moos today. She will proceed after surgery. -We also discussed the breast cancer surveillance after her surgery. She will continue annual screening mammogram, self exam, and a routine office visit with lab and exam with [place]. -I encouraged her to have healthy diet and exercise regularly.  -She had hysterectomy, not sure if she is postmenopausal.  I will check [place] and estradiol level on her next visit.  2. Comorbidities: DM with neuropathy, HTN, s/p kidney transplant -continue medications and f/u -she has diabetic neuropathy at baseline, on gabapentin  3. Covid-19+ 05/2019   PLAN:  -She will proceed left lumpectomy and sentinel lymph node biopsy soon -Oncotype on her surgical sample, or MammaPrint if positive lymph node -I will see her back after adjuvant radiation, or sooner if Oncotype shows high risk disease   Oncology History Overview Note  Cancer Staging Malignant neoplasm of lower-inner quadrant of left breast in female, estrogen receptor positive ([place]) Staging form: Breast, AJCC 8th Edition - Clinical stage from 02/09/2021: Stage IA (cT1c, cN0, cM0, G2, ER+, PR+, HER2-) - Signed by Truitt Merle, MD on 02/20/2021 Stage prefix: Initial diagnosis Histologic grading system: 3 grade system    Malignant neoplasm of lower-inner quadrant of left breast in female, estrogen receptor positive ([place])  02/07/2021 Mammogram   Exam: Left Diagnostic Mammogram; Left Breast Ultrasound  IMPRESSION: Irregular mass in left breast at 6:30, measuring 1.6 cm by mammogram, is highly suggestive of malignancy. Left axillary ultrasound is negative for lymphadenopathy.   02/09/2021 Cancer Staging   Staging  form: Breast, AJCC 8th Edition - Clinical stage from 02/09/2021: Stage IA (cT1c, cN0, cM0, G2, ER+, PR+, HER2-) - Signed by Truitt Merle,  MD on 02/20/2021 Stage prefix: Initial diagnosis Histologic grading system: 3 grade system   02/09/2021 Pathology Results   Diagnosis Breast, left, needle core biopsy, 6:30 o'clock 8cm fn - INVASIVE DUCTAL CARCINOMA WITH HISTIOCYTOID FEATURES. SEE NOTE Diagnosis Note Carcinoma measures 1 cm in greatest linear dimension and appears grade 2.  PROGNOSTIC INDICATORS Results: IMMUNOHISTOCHEMICAL AND MORPHOMETRIC ANALYSIS PERFORMED MANUALLY The tumor cells are EQUIVOCAL for Her2 (2+). Her2 by FISH will be performed and results performed separately. Estrogen Receptor: >95%, POSITIVE, STRONG STAINING INTENSITY Progesterone Receptor: 90%, POSITIVE, STRONG STAINING INTENSITY Proliferation Marker Ki67: 20%  FLUORESCENCE IN-SITU HYBRIDIZATION Results: GROUP 5: HER2 **NEGATIVE** Equivocal form of amplification of the HER2 gene was detected in the IHC 2+ tissue sample received from this individual. HER2 FISH was performed by a technologist and cell imaging and analysis on the [employer]. RATIO OF HER2/CEN17 SIGNALS 1.30 AVERAGE HER2 COPY NUMBER PER CELL 1.75   02/15/2021 Initial Diagnosis   Malignant neoplasm of lower-inner quadrant of left breast in female, estrogen receptor positive ([place])      HISTORY OF PRESENTING ILLNESS:  Cynthia [name] 50 y.o. female is a here because of breast cancer. The patient was referred by [facility]. The patient presents to the clinic today accompanied by her daughter.   She had routine screening mammography on 11/02/20 showing a possible abnormality in the left breast. She underwent left diagnostic mammography and left breast ultrasonography on 02/07/21 showing: irregular left breast mass at 6:30, measuring 1.6 cm by mammogram.  Biopsy on 02/09/21 showed: invasive ductal carcinoma with histiocytoid features, grade 2. Prognostic indicators significant for: estrogen receptor, >95% positive and progesterone receptor, 90% positive. Proliferation marker Ki67 at 20%. HER2  negative.    Today the patient notes they felt/feeling prior/after... -she reports numbness and stiffness in her hands, as well as cramps in her back with pain that goes down her leg. This began following a fall roughly two years ago, but she notes these worsened 2-3 weeks ago. She notes the pain in her back is constant, she rates it 7-8/10, and it mostly occurs at night. It was previously ~5/10. She notes pain with any kind of pressure to her arms or legs.   She has a PMHx of.... -DM, likely with associated neuropathy in her hands, on gabapentin -s/p kidney transplant in 2017 -depression, on prozac -HTN   Socially...  -she reports maternal grandmother with uterine cancer. There is no other family history of cancer to her knowledge. -she has never smoked, and drinks alcohol socially   GYN HISTORY  Menarchal: 10 LMP: with hysterectomy, had hot flashes about 2-3 years ago Contraceptive: HRT:  GP: 1 -s/p hysterectomy for fibroids, unsure if ovaries were removed   REVIEW OF SYSTEMS:    Constitutional: Denies fevers, chills or abnormal night sweats Eyes: Denies blurriness of vision, double vision or watery eyes Ears, nose, mouth, throat, and face: Denies mucositis or sore throat Respiratory: Denies cough, dyspnea or wheezes Cardiovascular: Denies palpitation, chest discomfort or lower extremity swelling Gastrointestinal:  Denies nausea, heartburn or change in bowel habits Skin: Denies abnormal skin rashes Lymphatics: Denies new lymphadenopathy or easy bruising Neurological:Denies numbness, tingling or new weaknesses Behavioral/Psych: Mood is stable, no new changes  All other systems were reviewed with the patient and are negative.   MEDICAL HISTORY:  Past Medical History:  Diagnosis Date   Allergy  pollen   Anemia    when on dialysis   Anxiety    Breast cancer [facility])    Cataract    surgical repair bilateral   Depression    ESRD on hemodialysis ([place])    Home HD 5x  per week- not on dialysis now had tramsplant 4/17   Family history of ovarian cancer 02/22/2021   GERD (gastroesophageal reflux disease)    Hearing loss 2017   right ear   History of blood transfusion    transfusion reaction   Hyperlipidemia    Hypertension    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 per pt-dr levy also II   Kidney transplant recipient    Neuromuscular disorder ([place])    NEUROPATHY    SURGICAL HISTORY: Past Surgical History:  Procedure Laterality Date   ABDOMINAL HYSTERECTOMY     BREAST EXCISIONAL BIOPSY Right 08/2015   BREAST LUMPECTOMY WITH RADIOACTIVE SEED LOCALIZATION Right 09/14/2015   Procedure: RIGHT BREAST LUMPECTOMY WITH RADIOACTIVE SEED LOCALIZATION;  Surgeon: Donnie Mesa, MD;  Location: [place];  Service: General;  Laterality: Right;   BREAST SURGERY Bilateral    biopsy bilateral   CATARACT EXTRACTION Bilateral    bilateral   CHOLECYSTECTOMY     CYST REMOVAL NECK     DIALYSIS FISTULA CREATION Left    EYE SURGERY Bilateral    lazer   LEFT HEART CATH AND CORONARY ANGIOGRAPHY N/A 03/04/2019   Procedure: LEFT HEART CATH AND CORONARY ANGIOGRAPHY;  Surgeon: Martinique, Peter M, MD;  Location: [place] CV LAB;  Service: Cardiovascular;  Laterality: N/A;   LEFT HEART CATHETERIZATION WITH CORONARY ANGIOGRAM N/A 03/30/2014   Procedure: LEFT HEART CATHETERIZATION WITH CORONARY ANGIOGRAM;  Surgeon: Sinclair Grooms, MD;  Location[facility] CATH LAB;  Service: Cardiovascular;  Laterality: N/A;   LIPOMA EXCISION N/A 02/06/2017   Procedure: EXCISION POSTERIOR NECK SEBACEOUS CYST;  Surgeon: Coralie Keens, MD;  Location: [place];  Service: General;  Laterality: N/A;   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   Procedure: REPAIR OF ARTERIOVENOUS FISTULA ANEURYSM;  Surgeon: Serafina Mitchell, MD;  Location: [facility] OR;  Service: Vascular;  Laterality: Left;   REVISON OF ARTERIOVENOUS FISTULA Left  09/28/2013   Procedure: EXCISE ESCHAR LEFT ARM  ARTERIOVENOUS FISTULA WITH PLICATION OF LEFT ARM ARTERIOVENOUS FISTULA;  Surgeon: Elam Dutch, MD;  Location: [facility] OR;  Service: Vascular;  Laterality: Left;   REVISON OF ARTERIOVENOUS FISTULA Left 08/26/2015   Procedure: RESECTION ANEURYSM OF LEFT ARM ARTERIOVENOUS FISTULA  ;  Surgeon: Serafina Mitchell, MD;  Location: [facility] OR;  Service: Vascular;  Laterality: Left;   TUBAL LIGATION      SOCIAL HISTORY: Social History   Socioeconomic History   Marital status: Legally Separated    Spouse name: Not on file   Number of children: 3   Years of education: 9   Highest education level: Not on file  Occupational History   Occupation: disabled  Tobacco Use   Smoking status: Never   Smokeless tobacco: Never  Vaping Use   Vaping Use: Never used  Substance and Sexual Activity   Alcohol use: Yes    Comment: Socially   Drug use: No   Sexual activity: Yes    Birth control/protection: None  Other Topics Concern   Not on file  Social History Narrative   Lives in home with daughters, grand daughter, son-in-law   Caffeine use - 1 cup coffee  sometimes    Social Determinants of Health   Financial Resource Strain: High Risk   Difficulty of Paying Living Expenses: Hard  Food Insecurity: Food Insecurity Present   Worried About Charity fundraiser in the Last Year: Sometimes true   Ran Out of Food in the Last Year: Sometimes true  Transportation Needs: No Transportation Needs   Lack of Transportation (Medical): No   Lack of Transportation (Non-Medical): No  Physical Activity: Not on file  Stress: Not on file  Social Connections: Not on file  Intimate Partner Violence: Not on file    FAMILY HISTORY: Family History  Problem Relation Age of Onset   Diabetes Mother    Kidney disease Mother    Diabetes Father    Heart disease Father    Kidney disease Father    Hypertension Father    Diabetes Sister    Asthma Sister    Lupus Sister     Diabetes Brother    Diabetes Brother    Diabetes Brother    Diabetes Brother    Cancer Maternal Grandmother 41       ovarian cancer   Diabetes Maternal Grandfather    Diabetes Paternal Grandmother    Colon cancer Neg Hx    Colon polyps Neg Hx    Esophageal cancer Neg Hx    Rectal cancer Neg Hx    Stomach cancer Neg Hx     ALLERGIES:  is allergic to other and tape.  MEDICATIONS:  Current Outpatient Medications  Medication Sig Dispense Refill   Accu-Chek Softclix Lancets lancets Use as instructed 100 each 12   amLODipine (NORVASC) 10 MG tablet Take 10 mg by mouth daily.     aspirin EC 81 MG EC tablet Take 1 tablet (81 mg total) by mouth daily. (Patient taking differently: Take 81 mg by mouth at bedtime.) 90 tablet 3   B Complex-C (SUPER B COMPLEX PO) Take 1 tablet by mouth daily.      Blood Glucose Monitoring Suppl (ACCU-CHEK AVIVA CONNECT) w/Device KIT Check sugar 1x daily 1 kit 0   carvedilol (COREG) 25 MG tablet Take 12.5 mg by mouth 2 (two) times daily with a meal.      cetirizine (ZYRTEC) 10 MG tablet Take 5 mg by mouth daily. Takes 0.5 tablet daily     Cholecalciferol (VITAMIN D3) 1000 units CAPS Take 1,000 Units by mouth daily.      docusate sodium (COLACE) 100 MG capsule Take 100 mg by mouth 2 (two) times daily.     fludrocortisone (FLORINEF) 0.1 MG tablet Take 0.1 mg by mouth daily.     FLUoxetine (PROZAC) 40 MG capsule Take 40 mg by mouth daily.     gabapentin (NEURONTIN) 300 MG capsule Take 300 mg by mouth at bedtime.  12   glucose blood (ACCU-CHEK AVIVA PLUS) test strip Check sugar 1x daily 100 each 12   insulin glargine (LANTUS SOLOSTAR) 100 UNIT/ML Solostar Pen Inject 65 Units into the skin every morning. 75 mL PRN   Insulin Pen Needle (PEN NEEDLES) 30G X 8 MM MISC 1 each by Does not apply route daily. E11.9 90 each 0   magnesium oxide (MAG-OX) 400 MG tablet Take 400 mg by mouth 2 (two) times daily.      mycophenolate (MYFORTIC) 180 MG EC tablet Take 360 mg by mouth 2  (two) times daily.     pantoprazole (PROTONIX) 40 MG tablet Take 40 mg by mouth daily.  5   predniSONE (DELTASONE) 5  MG tablet Take 5 mg by mouth daily.   2   rosuvastatin (CRESTOR) 5 MG tablet Take 5 mg by mouth at bedtime.     Tacrolimus ER (ENVARSUS XR) 1 MG TB24 Take 3 mg by mouth every morning.     vitamin E 400 UNIT capsule Take 400 Units by mouth daily.     No current facility-administered medications for this visit.    PHYSICAL EXAMINATION: ECOG PERFORMANCE STATUS: 1 - Symptomatic but completely ambulatory  Vitals:   02/22/21 0935  BP: (!) 125/51  Pulse: 62  Resp: 18  Temp: 97.6 F (36.4 C)  SpO2: 98%   Filed Weights   02/22/21 0935  Weight: 210 lb (95.3 kg)    GENERAL:alert, no distress and comfortable SKIN: skin color, texture, turgor are normal, no rashes or significant lesions EYES: normal, Conjunctiva are pink and non-injected, sclera clear  NECK: supple, thyroid normal size, non-tender, without nodularity LYMPH:  no palpable lymphadenopathy in the cervical, axillary  LUNGS: clear to auscultation and percussion with normal breathing effort HEART: regular rate & rhythm and no murmurs and no lower extremity edema ABDOMEN:abdomen soft, non-tender and normal bowel sounds Musculoskeletal:no cyanosis of digits and no clubbing, (+) cramping in entire back NEURO: alert & oriented x 3 with fluent speech, no focal motor/sensory deficits BREAST: left breast biopsy site not easily seen, healed well; prior biopsy site in right breast. No palpable mass, nodules or adenopathy bilaterally. Breast exam benign.  LABORATORY DATA:  I have reviewed the data as listed CBC Latest Ref Rng & Units 02/22/2021 06/06/2019 06/05/2019  WBC 4.0 - 10.5 K/uL 6.0 3.9(L) 4.7  Hemoglobin 12.0 - 15.0 g/dL 13.7 13.1 13.0  Hematocrit 36.0 - 46.0 % 40.0 38.8 38.9  Platelets 150 - 400 K/uL 168 190 184    CMP Latest Ref Rng & Units 02/22/2021 06/06/2019 06/05/2019  Glucose 70 - 99 mg/dL 67(L) 95  117(H)  BUN 6 - 20 mg/dL 23(H) 22(H) 21(H)  Creatinine 0.44 - 1.00 mg/dL 1.27(H) 0.90 0.93  Sodium 135 - 145 mmol/L 141 137 135  Potassium 3.5 - 5.1 mmol/L 4.2 4.2 4.4  Chloride 98 - 111 mmol/L 108 104 101  CO2 22 - 32 mmol/L _0 Calcium 8.9 - 10.3 mg/dL 10.6(H) 9.8 9.4  Total Protein 6.5 - 8.1 g/dL 6.8 6.0(L) 6.0(L)  Total Bilirubin 0.3 - 1.2 mg/dL 0.4 0.4 0.7  Alkaline Phos 38 - 126 U/L 92 68 59  AST 15 - 41 U/L 32 25 34  ALT 0 - 44 U/L 58(H) 36 41     RADIOGRAPHIC STUDIES: I have personally reviewed the radiological images as listed and agreed with the findings in the report. No results found.   Orders Placed This Encounter  Procedures   CBC with Differential ([place] Only)    Standing Status:   Standing    Number of Occurrences:   10    Standing Expiration Date:   02/22/2022   CMP ([place] only)    Standing Status:   Standing    Number of Occurrences:   10    Standing Expiration Date:   5/85/9292   FSH-Follicle stimulating hormone    Standing Status:   Future    Standing Expiration Date:   02/22/2022   Estradiol    Standing Status:   Future    Standing Expiration Date:   02/22/2022    All questions were answered. The patient knows to call the clinic with any problems, questions  or concerns. The total time spent in the appointment was 50 minutes.     Truitt Merle, MD 02/22/2021   I, Wilburn Mylar, am acting as scribe for Truitt Merle, MD.   I have reviewed the above documentation for accuracy and completeness, and I agree with the above.

## 2021-02-22 NOTE — Progress Notes (Signed)
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  HISTORY OF PRESENT ILLNESS:   Cynthia [name], a 50 y.o. female, was seen for a [facility] cancer genetics consultation during the breast multidisciplinary clinic at the request of Dr. Burr Medico due to a personal and family history of cancer.  Cynthia [name] presents to clinic today to discuss the possibility of a hereditary predisposition to cancer, to discuss genetic testing, and to further clarify her future cancer risks, as well as potential cancer risks for family members.   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 The preliminary treatment plan is pending.  CANCER HISTORY:  Oncology History  Malignant neoplasm of lower-inner quadrant of left breast in female, estrogen receptor positive ([place])  02/07/2021 Mammogram   Exam: Left Diagnostic Mammogram; Left Breast Ultrasound  IMPRESSION: Irregular mass in left breast at 6:30, measuring 1.6 cm by mammogram, is highly suggestive of malignancy. Left axillary ultrasound is negative for lymphadenopathy.   02/09/2021 Cancer Staging   Staging form: Breast, AJCC 8th Edition - Clinical stage from 02/09/2021: Stage IA (cT1c, cN0, cM0, G2, ER+, PR+, HER2-) - Signed by Truitt Merle, MD on 02/20/2021 Stage prefix: Initial diagnosis Histologic grading system: 3 grade system   02/09/2021 Pathology Results   Diagnosis Breast, left, needle core biopsy, 6:30 o'clock 8cm fn - INVASIVE DUCTAL CARCINOMA WITH HISTIOCYTOID FEATURES. SEE NOTE Diagnosis Note Carcinoma measures 1 cm in greatest linear dimension and appears grade 2.  PROGNOSTIC INDICATORS Results: IMMUNOHISTOCHEMICAL AND MORPHOMETRIC ANALYSIS PERFORMED MANUALLY The tumor cells are EQUIVOCAL for Her2 (2+). Her2 by  FISH will be performed and results performed separately. Estrogen Receptor: >95%, POSITIVE, STRONG STAINING INTENSITY Progesterone Receptor: 90%, POSITIVE, STRONG STAINING INTENSITY Proliferation Marker Ki67: 20%  FLUORESCENCE IN-SITU HYBRIDIZATION Results: GROUP 5: HER2 **NEGATIVE** Equivocal form of amplification of the HER2 gene was detected in the IHC 2+ tissue sample received from this individual. HER2 FISH was performed by a technologist and cell imaging and analysis on the [employer]. RATIO OF HER2/CEN17 SIGNALS 1.30 AVERAGE HER2 COPY NUMBER PER CELL 1.75   02/15/2021 Initial Diagnosis   Malignant neoplasm of lower-inner quadrant of left breast in female, estrogen receptor positive ([place])      RISK FACTORS:  Menarche was at age 13.  First live birth at age 72.  Hysterectomy: yes, due to fibroids, unsure if ovaries were removed Colonoscopy: yes;  2 polyps were identified Mammogram within the last year: yes.  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-dr levy also II   Kidney transplant recipient    Neuromuscular disorder ([place])    NEUROPATHY  Past Surgical History:  Procedure Laterality Date   ABDOMINAL HYSTERECTOMY     BREAST EXCISIONAL BIOPSY Right 08/2015   BREAST LUMPECTOMY WITH RADIOACTIVE SEED LOCALIZATION Right 09/14/2015   Procedure: RIGHT BREAST LUMPECTOMY WITH RADIOACTIVE SEED LOCALIZATION;  Surgeon: Donnie Mesa, MD;  Location: [facility];  Service:  General;  Laterality: Right;   BREAST SURGERY Bilateral    biopsy bilateral   CATARACT EXTRACTION Bilateral    bilateral   CHOLECYSTECTOMY     CYST REMOVAL NECK     DIALYSIS FISTULA CREATION Left    EYE SURGERY Bilateral    lazer   LEFT HEART CATH AND CORONARY ANGIOGRAPHY N/A 03/04/2019   Procedure: LEFT HEART CATH AND CORONARY ANGIOGRAPHY;  Surgeon: Martinique, Peter M, MD;  Location: [facility];  Service: Cardiovascular;  Laterality: N/A;   LEFT HEART CATHETERIZATION WITH CORONARY ANGIOGRAM N/A 03/30/2014   Procedure: LEFT HEART CATHETERIZATION WITH CORONARY ANGIOGRAM;  Surgeon: Sinclair Grooms, MD;  Location: [facility] CATH LAB;  Service: Cardiovascular;  Laterality: N/A;   LIPOMA EXCISION N/A 02/06/2017   Procedure: EXCISION POSTERIOR NECK SEBACEOUS CYST;  Surgeon: Coralie Keens, MD;  Location: [facility];  Service: General;  Laterality: N/A;   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   Procedure: REPAIR OF ARTERIOVENOUS FISTULA ANEURYSM;  Surgeon: Serafina Mitchell, MD;  Location: [facility] OR;  Service: Vascular;  Laterality: Left;   REVISON OF ARTERIOVENOUS FISTULA Left 09/28/2013   Procedure: EXCISE ESCHAR LEFT ARM  ARTERIOVENOUS FISTULA WITH PLICATION OF LEFT ARM ARTERIOVENOUS FISTULA;  Surgeon: Elam Dutch, MD;  Location[facility] OR;  Service: Vascular;  Laterality: Left;   REVISON OF ARTERIOVENOUS FISTULA Left 08/26/2015   Procedure: RESECTION ANEURYSM OF LEFT ARM ARTERIOVENOUS FISTULA  ;  Surgeon: Serafina Mitchell, MD;  Location: [facility] OR;  Service: Vascular;  Laterality: Left;   TUBAL LIGATION      Social History   Socioeconomic History   Marital status: Legally Separated    Spouse name: Not on file   Number of children: 3   Years of education: 9   Highest education level: Not on file  Occupational History   Occupation: disabled  Tobacco Use   Smoking status: Never   Smokeless tobacco: Never  Vaping Use   Vaping Use: Never used  Substance and Sexual Activity   Alcohol  use: Yes    Comment: Socially   Drug use: No   Sexual activity: Yes    Birth control/protection: None  Other Topics Concern   Not on file  Social History Narrative   Lives in home with daughters, grand daughter, son-in-law   Caffeine use - 1 cup coffee sometimes    Social Determinants of Health   Financial Resource Strain: Not on file  Food Insecurity: Not on file  Transportation Needs: Not on file  Physical Activity: Not on file  Stress: Not on file  Social Connections: Not on file     FAMILY HISTORY:  We obtained a detailed, 4-generation family history.  Significant diagnoses are listed below:  Family History  Problem Relation Age of Onset   Cancer Maternal Grandmother 108       ovarian cancer   Ms. Grulke is unaware of previous family history of genetic testing for hereditary cancer risks.      GENETIC COUNSELING ASSESSMENT: Ms. Mckibbin is a 50 y.o. female with a personal and family history of cancer which is somewhat suggestive of a hereditary cancer syndrome and predisposition to cancer  given the presence of related cancers in the family. Therefore, we discussed and recommended the following at today's visit.   DISCUSSION: We discussed that 5 - 10% of cancer is hereditary, with most cases of hereditary breast cancer associated with mutations in BRCA1/2.  There are other genes that can be associated with hereditary breast and ovarian cancer syndromes.  These include BRCA1/2.  There are other genes that can be associated with hereditary breast or ovarian cancer syndromes. Type of cancer risk and level of risk are gene-specific. We discussed that testing is beneficial for several reasons including knowing how to follow individuals after completing their treatment, identifying whether potential treatment options would be beneficial, and understanding if other family members could be at risk for cancer and allowing them to undergo genetic testing.   We reviewed the characteristics,  features and inheritance patterns of hereditary cancer syndromes. We also discussed genetic testing, including the appropriate family members to test, the process of testing, insurance coverage and turn-around-time for results. We discussed the implications of a negative, positive and/or variant of uncertain significant result. In order to get genetic test results in a timely manner so that Ms. Cottone can use these genetic test results for surgical decisions, we recommended Ms. Hennigan pursue genetic testing for a panel that includes genes associated with breast and ovarian cancer.  The CustomNext-Cancer+RNAinsight panel offered by [facility] includes sequencing and rearrangement analysis for the following 47 genes:  APC, ATM, AXIN2, BARD1, BMPR1A, BRCA1, BRCA2, BRIP1, CDH1, CDK4, CDKN2A, CHEK2, DICER1, EPCAM, GREM1, HOXB13, MEN1, MLH1, MSH2, MSH3, MSH6, MUTYH, NBN, NF1, NF2, NTHL1, PALB2, PMS2, POLD1, POLE, PTEN, RAD51C, RAD51D, RECQL, RET, SDHA, SDHAF2, SDHB, SDHC, SDHD, SMAD4, SMARCA4, STK11, TP53, TSC1, TSC2, and VHL.  RNA data is routinely analyzed for use in variant interpretation for all genes.  Based on Ms. Millstein's personal and family history of cancer, she meets medical criteria for genetic testing. Despite that she meets criteria, she may still have an out of pocket cost. We discussed that if her out of pocket cost for testing is over $100, the laboratory should contact them to discuss self-pay prices, patient pay assistance programs, if applicable, and other billing options.   PLAN: After considering the risks, benefits, and limitations, Ms. Erck provided informed consent to pursue genetic testing and the blood sample was sent to [facility] for analysis of the BRCAplus with reflex to the CustomNext-Cancer+RNAinsight panel. Results should be available within approximately 1-2 weeks' time for BRCAplus, at which point they will be disclosed by telephone to Ms. Moskal, as will any additional  recommendations warranted by these results. Ms. Byron will receive a summary of her genetic counseling visit and a copy of her results once available. This information will also be available in Epic.   Ms. Jeancharles questions were answered to her satisfaction today. Our contact information was provided should additional questions or concerns arise. Thank you for the referral and allowing [place] to share in the care of your patient.   Lew Dawes, MS, [facility] Genetic Counselor [email or web address] (P) [phone]  The patient was seen for a total of 20 minutes in face-to-face genetic counseling. The patient brought her daughter to the appointment. Drs. Magrinat, Lindi Adie and/or Burr Medico were available to discuss this case as needed.  _______________________________________________________________________ For Office Staff:  Number of people involved in session: 3 (including her daughter and the interpreter) Was an Intern/ student involved with case: no

## 2021-02-22 NOTE — Therapy (Signed)
[place], [place], [address] Phone: [phone]   Fax:  [phone]  Physical Therapy Evaluation  Patient Details  Name: Cynthia [name] MRN: [id number] Date of Birth: 05-12-1971 Referring Provider (PT): Dr. Erroll Luna   Encounter Date: 02/22/2021   PT End of Session - 02/22/21 1130     Visit Number 1    Number of Visits 2    Date for PT Re-Evaluation 04/19/21    PT Start Time 0921    PT Stop Time 0942   Also saw pt from [phone] for a total of 30 min   PT Time Calculation (min) 21 min    Activity Tolerance Patient tolerated treatment well    Behavior During Therapy [facility] for tasks assessed/performed             Past Medical History:  Diagnosis Date   Allergy    pollen   Anemia    when on dialysis   Anxiety    Breast cancer [facility])    Cataract    surgical repair bilateral   Depression    ESRD on hemodialysis ([place])    Home HD 5x per week- not on dialysis now had tramsplant 4/17   GERD (gastroesophageal reflux disease)    Hearing loss 2017   right ear   History of blood transfusion    transfusion reaction   Hyperlipidemia    Hypertension    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 per pt-dr levy also II   Kidney transplant recipient    Neuromuscular disorder ([place])    NEUROPATHY    Past Surgical History:  Procedure Laterality Date   ABDOMINAL HYSTERECTOMY     BREAST EXCISIONAL BIOPSY Right 08/2015   BREAST LUMPECTOMY WITH RADIOACTIVE SEED LOCALIZATION Right 09/14/2015   Procedure: RIGHT BREAST LUMPECTOMY WITH RADIOACTIVE SEED LOCALIZATION;  Surgeon: Donnie Mesa, MD;  Location: [place];  Service: General;  Laterality: Right;   BREAST SURGERY Bilateral    biopsy bilateral   CATARACT EXTRACTION Bilateral    bilateral   CHOLECYSTECTOMY     CYST REMOVAL NECK     DIALYSIS FISTULA CREATION Left    EYE SURGERY  Bilateral    lazer   LEFT HEART CATH AND CORONARY ANGIOGRAPHY N/A 03/04/2019   Procedure: LEFT HEART CATH AND CORONARY ANGIOGRAPHY;  Surgeon: Martinique, Peter M, MD;  Location: [place] CV LAB;  Service: Cardiovascular;  Laterality: N/A;   LEFT HEART CATHETERIZATION WITH CORONARY ANGIOGRAM N/A 03/30/2014   Procedure: LEFT HEART CATHETERIZATION WITH CORONARY ANGIOGRAM;  Surgeon: Sinclair Grooms, MD;  Location: [facility] CATH LAB;  Service: Cardiovascular;  Laterality: N/A;   LIPOMA EXCISION N/A 02/06/2017   Procedure: EXCISION POSTERIOR NECK SEBACEOUS CYST;  Surgeon: Coralie Keens, MD;  Location: [place];  Service: General;  Laterality: N/A;   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   Procedure: REPAIR OF ARTERIOVENOUS FISTULA ANEURYSM;  Surgeon: Serafina Mitchell, MD;  [facility];  Service: Vascular;  Laterality: Left;   REVISON OF ARTERIOVENOUS FISTULA Left 09/28/2013   Procedure: EXCISE ESCHAR LEFT ARM  ARTERIOVENOUS FISTULA WITH PLICATION OF LEFT ARM ARTERIOVENOUS FISTULA;  Surgeon: Elam Dutch, MD;  Location: [place];  Service: Vascular;  Laterality: Left;   REVISON OF ARTERIOVENOUS FISTULA Left 08/26/2015   Procedure: RESECTION ANEURYSM OF LEFT ARM ARTERIOVENOUS FISTULA  ;  Surgeon: Serafina Mitchell, MD;  Location: [place];  Service:  Vascular;  Laterality: Left;   TUBAL LIGATION      There were no vitals filed for this visit.    Subjective Assessment - 02/22/21 0950     Subjective Patient reports she is here today to be seen by her medical team for her newly diagnosed left breast cancer.    Patient is accompained by: Family member    Pertinent History Patient was diagnosed on 02/09/2021 with left grade II invasive ductal carcinoma breast cancer. It measures 1.6 cm and is located in the lower inner quadrant. It is ER/PR positive and HER2 negative with a Ki67 of 20%. She had a kidney transplant 10/14/2015.    Patient Stated Goals Reduce lymphedema risk and learn HEP for post op     Currently in Pain? Yes    Pain Score 6     Pain Location Back    Pain Orientation Upper    Pain Descriptors / Indicators Aching    Pain Type Chronic pain    Pain Onset More than a month ago    Pain Frequency Intermittent    Aggravating Factors  stress    Pain Relieving Factors unknown                [facility] PT Assessment - 02/22/21 0001       Assessment   Medical Diagnosis Left breast cancer    Referring Provider (PT) Dr. Marcello Moores [place]    Onset Date/Surgical Date 02/09/21    Hand Dominance Right    Prior Therapy none      Precautions   Precautions Other (comment)    Precaution Comments active cancer; kidney transplant      Restrictions   Weight Bearing Restrictions No      Balance Screen   Has the patient fallen in the past 6 months No    Has the patient had a decrease in activity level because of a fear of falling?  No    Is the patient reluctant to leave their home because of a fear of falling?  No      Home Ecologist residence    Living Arrangements Spouse/significant other   Partner   Available Help at Discharge Family      Prior Function   Level of Independence Independent    Vocation Unemployed    Leisure She does not exercise      Cognition   Overall Cognitive Status Within Functional Limits for tasks assessed      Posture/Postural Control   Posture/Postural Control Postural limitations    Postural Limitations Rounded Shoulders;Forward head      ROM / Strength   AROM / PROM / Strength AROM;Strength      AROM   Overall AROM Comments Cervical AROM is WNL    AROM Assessment Site Shoulder    Right/Left Shoulder Right;Left    Right Shoulder Extension 53 Degrees    Right Shoulder Flexion 159 Degrees    Right Shoulder ABduction 163 Degrees    Right Shoulder Internal Rotation 74 Degrees    Right Shoulder External Rotation 85 Degrees    Left Shoulder Extension 53 Degrees    Left Shoulder Flexion 145 Degrees    Left  Shoulder ABduction 164 Degrees    Left Shoulder Internal Rotation 65 Degrees    Left Shoulder External Rotation 86 Degrees      Strength   Overall Strength Within functional limits for tasks performed  LYMPHEDEMA/ONCOLOGY QUESTIONNAIRE - 02/22/21 0001       Type   Cancer Type left breast cancer      Lymphedema Assessments   Lymphedema Assessments Upper extremities      Right Upper Extremity Lymphedema   10 cm Proximal to Olecranon Process 34.3 cm    Olecranon Process 28.8 cm    10 cm Proximal to Ulnar Styloid Process 25.2 cm    Just Proximal to Ulnar Styloid Process 18.2 cm    Across Hand at [employer] 19.7 cm    At [place] of 2nd Digit 7.1 cm      Left Upper Extremity Lymphedema   10 cm Proximal to Olecranon Process 35.2 cm    Olecranon Process 28.2 cm    10 cm Proximal to Ulnar Styloid Process 24.5 cm    Just Proximal to Ulnar Styloid Process 18.4 cm    Across Hand at [employer] 19.3 cm    At [place] of 2nd Digit 7.2 cm             L-DEX FLOWSHEETS - 02/22/21 1100       L-DEX LYMPHEDEMA SCREENING   Measurement Type Unilateral    L-DEX MEASUREMENT EXTREMITY Upper Extremity    POSITION  Standing    DOMINANT SIDE Right    At Risk Side Left    BASELINE SCORE (UNILATERAL) 12.7   May be abnormal reading as she has fistula in left UE             The patient was assessed using the L-Dex machine today to produce a lymphedema index baseline score. The patient will be reassessed on a regular basis (typically every 3 months) to obtain new L-Dex scores. If the score is > 6.5 points away from his/her baseline score indicating onset of subclinical lymphedema, it will be recommended to wear a compression garment for 4 weeks, 12 hours per day and then be reassessed. If the score continues to be > 6.5 points from baseline at reassessment, we will initiate lymphedema treatment. Assessing in this manner has a 95% rate of preventing clinically significant  lymphedema.     Katina Dung - 02/22/21 0001     Open a tight or new jar No difficulty    Do heavy household chores (wash walls, wash floors) No difficulty    Carry a shopping bag or briefcase No difficulty    Wash your back No difficulty    Use a knife to cut food No difficulty    Recreational activities in which you take some force or impact through your arm, shoulder, or hand (golf, hammering, tennis) No difficulty    During the past week, to what extent has your arm, shoulder or hand problem interfered with your normal social activities with family, friends, neighbors, or groups? Not at all    During the past week, to what extent has your arm, shoulder or hand problem limited your work or other regular daily activities Not at all    Arm, shoulder, or hand pain. None    Tingling (pins and needles) in your arm, shoulder, or hand None    Difficulty Sleeping No difficulty    DASH Score 0 %              Objective measurements completed on examination: See above findings.      Patient was instructed today in a home exercise program today for post op shoulder range of motion. These included active assist shoulder flexion in  sitting, scapular retraction, wall walking with shoulder abduction, and hands behind head external rotation.  She was encouraged to do these twice a day, holding 3 seconds and repeating 5 times when permitted by her physician.           PT Education - 02/22/21 1129     Education Details Lymphedema risk reduction and post op shoulder ROM HEP    Person(s) Educated Patient;Child(ren)    Methods Explanation;Demonstration;Handout    Comprehension Returned demonstration;Verbalized understanding                 PT Long Term Goals - 02/22/21 1133       PT LONG TERM GOAL #1   Title Patient will demonstrate she has regained full shoulder ROM and function post operatively compared to baselines.    Time 8    Period Weeks    Status New    Target Date  04/19/21             Breast Clinic Goals - 02/22/21 1133       Patient will be able to verbalize understanding of pertinent lymphedema risk reduction practices relevant to her diagnosis specifically related to skin care.   Time 1    Period Days    Status Achieved      Patient will be able to return demonstrate and/or verbalize understanding of the post-op home exercise program related to regaining shoulder range of motion.   Time 1    Period Days    Status Achieved      Patient will be able to verbalize understanding of the importance of attending the postoperative After Breast Cancer Class for further lymphedema risk reduction education and therapeutic exercise.   Time 1    Period Days    Status Achieved                   Plan - 02/22/21 1131     Clinical Impression Statement Patient was diagnosed on 02/09/2021 with left grade II invasive ductal carcinoma breast cancer. It measures 1.6 cm and is located in the lower inner quadrant. It is ER/PR positive and HER2 negative with a Ki67 of 20%. She had a kidney transplant 10/14/2015. Her multidisciplinary medical team met prior to her assessments to determine a recommended treatment plan. She is planning to have a left lumpectomy and sentinel node biopsy followed by Oncotype testing, radiation, and anti-estrogen therapy. She will benefit from a post op PT reassessment to determine needs and from L-Dex screens every 3 months for 2 years to detect subclinical lymphedema.    Stability/Clinical Decision Making Stable/Uncomplicated    Clinical Decision Making Low    Rehab Potential Excellent    PT Frequency --   Eval and 1 f/u visit   PT Treatment/Interventions ADLs/Self Care Home Management;Therapeutic exercise;Patient/family education    PT Next Visit Plan Will reassess 3-4 weeks post op    PT Home Exercise Plan Post op HEP    Consulted and Agree with Plan of Care Patient;Family member/caregiver    Family Member Consulted  daughter             Patient will benefit from skilled therapeutic intervention in order to improve the following deficits and impairments:  Postural dysfunction, Decreased range of motion, Decreased knowledge of precautions, Impaired UE functional use, Pain  Visit Diagnosis: Malignant neoplasm of lower-inner quadrant of left breast in female, estrogen receptor positive ([place]) - Plan: PT plan of care cert/re-cert  Abnormal posture - Plan:  PT plan of care cert/re-cert  Patient will follow up at outpatient cancer rehab 3-4 weeks following surgery.  If the patient requires physical therapy at that time, a specific plan will be dictated and sent to the referring physician for approval. The patient was educated today on appropriate basic range of motion exercises to begin post operatively and the importance of attending the After Breast Cancer class following surgery.  Patient was educated today on lymphedema risk reduction practices as it pertains to recommendations that will benefit the patient immediately following surgery.  She verbalized good understanding.      Problem List Patient Active Problem List   Diagnosis Date Noted   Malignant neoplasm of lower-inner quadrant of left breast in female, estrogen receptor positive ([place]) 02/15/2021   Foot pain, bilateral 07/28/2020   COVID-19 virus infection 06/16/2019   Insulin dependent type 2 diabetes mellitus ([place]) 06/06/2019   Hyperglycemia 06/02/2019   DKA (diabetic ketoacidoses) 06/02/2019   HTN (hypertension) 03/04/2019   Abnormal nuclear stress test 03/04/2019   Diabetic nephropathy ([place]) 03/03/2019   History of renal transplant 11/18/2015   Diabetic retinopathy associated with type 2 diabetes mellitus ([place]) 04/13/2015   Postprandial vomiting    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   Anemia in CKD (chronic kidney disease) 01/04/2015   Pseudoaneurysm of arteriovenous dialysis fistula ([place]) 03/16/2014   Hyperkalemia 11/16/2013    Chronic constipation 07/28/2013   Paresthesias 07/28/2013   Diabetes ([place])    Kidney transplant status 05/28/2013   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/11/2013   Precordial chest pain 02/10/2013   Awaiting organ transplant 09/19/2012   Annia Friendly, PT 02/22/21 11:35 AM   [place], [place], [address] Phone: [phone]   Fax:  [phone]  Name: Cynthia [name] MRN: [id number] Date of Birth: [birth year]

## 2021-02-22 NOTE — Patient Instructions (Signed)

## 2021-02-28 ENCOUNTER — Other Ambulatory Visit: Payer: Self-pay

## 2021-02-28 ENCOUNTER — Encounter (HOSPITAL_BASED_OUTPATIENT_CLINIC_OR_DEPARTMENT_OTHER)
Admission: RE | Admit: 2021-02-28 | Discharge: 2021-02-28 | Disposition: A | Discharge status: Home / Self Care | Payer: Medicare HMO | Source: Ambulatory Visit | Attending: Surgery | Admitting: Surgery

## 2021-02-28 ENCOUNTER — Encounter (HOSPITAL_BASED_OUTPATIENT_CLINIC_OR_DEPARTMENT_OTHER): Payer: Self-pay | Admitting: Surgery

## 2021-02-28 DIAGNOSIS — Z01812 Encounter for preprocedural laboratory examination: Secondary | ICD-10-CM | POA: Insufficient documentation

## 2021-02-28 DIAGNOSIS — Z1501 Genetic susceptibility to malignant neoplasm of breast: Secondary | ICD-10-CM | POA: Diagnosis not present

## 2021-02-28 DIAGNOSIS — C50312 Malignant neoplasm of lower-inner quadrant of left female breast: Secondary | ICD-10-CM | POA: Diagnosis not present

## 2021-02-28 DIAGNOSIS — Z888 Allergy status to other drugs, medicaments and biological substances status: Secondary | ICD-10-CM | POA: Diagnosis not present

## 2021-02-28 DIAGNOSIS — Z794 Long term (current) use of insulin: Secondary | ICD-10-CM | POA: Diagnosis not present

## 2021-02-28 DIAGNOSIS — Z170 Estrogen receptor positive status [ER+]: Secondary | ICD-10-CM | POA: Diagnosis not present

## 2021-02-28 DIAGNOSIS — Z940 Kidney transplant status: Secondary | ICD-10-CM | POA: Diagnosis not present

## 2021-02-28 DIAGNOSIS — Z7982 Long term (current) use of aspirin: Secondary | ICD-10-CM | POA: Diagnosis not present

## 2021-02-28 DIAGNOSIS — Z79899 Other long term (current) drug therapy: Secondary | ICD-10-CM | POA: Diagnosis not present

## 2021-02-28 DIAGNOSIS — Z7952 Long term (current) use of systemic steroids: Secondary | ICD-10-CM | POA: Diagnosis not present

## 2021-02-28 LAB — CBC WITH DIFFERENTIAL/PLATELET
Abs Immature Granulocytes: 0.03 10*3/uL (ref 0.00–0.07)
Basophils Absolute: 0.1 10*3/uL (ref 0.0–0.1)
Basophils Relative: 1 %
Eosinophils Absolute: 0.2 10*3/uL (ref 0.0–0.5)
Eosinophils Relative: 3 %
HCT: 43 % (ref 36.0–46.0)
Hemoglobin: 13.9 g/dL (ref 12.0–15.0)
Immature Granulocytes: 0 %
Lymphocytes Relative: 25 %
Lymphs Abs: 2 10*3/uL (ref 0.7–4.0)
MCH: 29.6 pg (ref 26.0–34.0)
MCHC: 32.3 g/dL (ref 30.0–36.0)
MCV: 91.7 fL (ref 80.0–100.0)
Monocytes Absolute: 0.7 10*3/uL (ref 0.1–1.0)
Monocytes Relative: 9 %
Neutro Abs: 4.9 10*3/uL (ref 1.7–7.7)
Neutrophils Relative %: 62 %
Platelets: 175 10*3/uL (ref 150–400)
RBC: 4.69 MIL/uL (ref 3.87–5.11)
RDW: 12.3 % (ref 11.5–15.5)
WBC: 8 10*3/uL (ref 4.0–10.5)
nRBC: 0 % (ref 0.0–0.2)

## 2021-02-28 LAB — COMPREHENSIVE METABOLIC PANEL
ALT: 47 U/L — ABNORMAL HIGH (ref 0–44)
AST: 26 U/L (ref 15–41)
Albumin: 3.8 g/dL (ref 3.5–5.0)
Alkaline Phosphatase: 77 U/L (ref 38–126)
Anion gap: 9 (ref 5–15)
BUN: 25 mg/dL — ABNORMAL HIGH (ref 6–20)
CO2: 26 mmol/L (ref 22–32)
Calcium: 10.4 mg/dL — ABNORMAL HIGH (ref 8.9–10.3)
Chloride: 104 mmol/L (ref 98–111)
Creatinine, Ser: 1.58 mg/dL — ABNORMAL HIGH (ref 0.44–1.00)
GFR, Estimated: 39 mL/min — ABNORMAL LOW (ref >60)
Glucose, Bld: 42 mg/dL — CL (ref 70–99)
Potassium: 4.4 mmol/L (ref 3.5–5.1)
Sodium: 139 mmol/L (ref 135–145)
Total Bilirubin: 0.4 mg/dL (ref 0.3–1.2)
Total Protein: 6.6 g/dL (ref 6.5–8.1)

## 2021-02-28 NOTE — Progress Notes (Signed)
Chart reviewed with Dr. Sabra Heck, as patient is going for seed placement today and cardiology apt tomorrow. Eval for cardiology is for edema and mitral regurgitation. Ok to proceed with planned surgery at [facility]

## 2021-02-28 NOTE — Progress Notes (Signed)

## 2021-03-01 ENCOUNTER — Ambulatory Visit (INDEPENDENT_AMBULATORY_CARE_PROVIDER_SITE_OTHER): Payer: Medicare HMO | Admitting: Cardiology

## 2021-03-01 ENCOUNTER — Encounter: Payer: Self-pay | Admitting: Genetic Counselor

## 2021-03-01 ENCOUNTER — Telehealth: Payer: Self-pay | Admitting: Genetic Counselor

## 2021-03-01 VITALS — BP 118/74 | HR 64 | Ht 61.0 in | Wt 207.0 lb

## 2021-03-01 DIAGNOSIS — I700 Atherosclerosis of aorta: Secondary | ICD-10-CM

## 2021-03-01 DIAGNOSIS — E1029 Type 1 diabetes mellitus with other diabetic kidney complication: Secondary | ICD-10-CM | POA: Diagnosis not present

## 2021-03-01 DIAGNOSIS — Z940 Kidney transplant status: Secondary | ICD-10-CM

## 2021-03-01 DIAGNOSIS — Z7189 Other specified counseling: Secondary | ICD-10-CM

## 2021-03-01 DIAGNOSIS — E7800 Pure hypercholesterolemia, unspecified: Secondary | ICD-10-CM | POA: Diagnosis not present

## 2021-03-01 DIAGNOSIS — R0602 Shortness of breath: Secondary | ICD-10-CM

## 2021-03-01 DIAGNOSIS — I10 Essential (primary) hypertension: Secondary | ICD-10-CM

## 2021-03-01 DIAGNOSIS — I340 Nonrheumatic mitral (valve) insufficiency: Secondary | ICD-10-CM

## 2021-03-01 DIAGNOSIS — Z1501 Genetic susceptibility to malignant neoplasm of breast: Secondary | ICD-10-CM

## 2021-03-01 DIAGNOSIS — Z1379 Encounter for other screening for genetic and chromosomal anomalies: Secondary | ICD-10-CM | POA: Insufficient documentation

## 2021-03-01 DIAGNOSIS — Z1509 Genetic susceptibility to other malignant neoplasm: Secondary | ICD-10-CM | POA: Insufficient documentation

## 2021-03-01 DIAGNOSIS — Z1502 Genetic susceptibility to malignant neoplasm of ovary: Secondary | ICD-10-CM

## 2021-03-01 HISTORY — DX: Genetic susceptibility to malignant neoplasm of ovary: Z15.02

## 2021-03-01 HISTORY — DX: Genetic susceptibility to malignant neoplasm of breast: Z15.01

## 2021-03-01 NOTE — Progress Notes (Signed)
Cardiology Office Note:    Date:  03/01/2021   ID:  Cynthia [name], DOB 02-24-1971, MRN [id number]  PCP:  Pcp, No  Cardiologist:  Buford Dresser, MD  Referring MD: Rexene Agent, MD   CC: new patient evaluation for mitral regurgitation and edema  History of Present Illness:    Cynthia [name] is a 50 y.o. female with a hx of recently diagnosed left breast cancer, nonobstructive CAD, type 1 diabetes (diagnosed 1993), hypertension, history of ESRD s/p kidney transplant 2017 who is seen as a new consult at the request of Rexene Agent, MD for the evaluation and management of mitral regurgitation and edema.  Referral from Dr. Joelyn Oms dated 01/04/21 reviewed. She had been seen by Dr. Claudie Leach at [facility] cardiology, told she had mitral regurgitation. Presented to [facility] nephrology transplant appt with more edema than usual. Referred to [facility] heartcare for further evaluation.  Interpretor Rebeca Allegra present for visit.  Patient was told that she had a "bad valve", was told to meet with Dr. Claudie Leach. When she met with him, she was not given additional information about the valve except that it was not severe. He did not give any additional information and did not discuss next steps or additional testing.   She does endorse worsening shortness of breath over the last 3-4 months. She has noticed more LE edema, which improved after Dr. Joelyn Oms prescribed her a fluid pill. This cleared the fluid but did not improve her shortness of breath. She noted rapid weight gain as well, from 170 lbs to 207 lbs over the last 4 mos. Weight went down slightly with decrease in LE edema, as did the shortness of breath. Still feels short of breath at night, sometimes needing three pillows to sleep. Denies PND. Has had sleep study, told she has sleep apnea. Has not been given CPAP yet.   She is trying to walk more, but can walk about 1/2 block before she becomes short of breath.   Feels chest discomfort about  3-4 times/week. Not a heaviness or pain, but feels strange. Not related to exertion, can occur randomly both at rest and when active.  Father had two heart attacks. No other heart disease that she knows of.  No syncope, rare palpitations.   She notes Dr. Claudie Leach did both an echo and a stress test. We will attempt to get a copy of these tests.  She does have surgery for stage 1 breast cancer tomorrow. Has not required cardiology clearance per report. Note in chart from nurse yesterday notes she is ok to proceed with surgery.  Past Medical History:  Diagnosis Date   Allergy    pollen   Anemia    when on dialysis   Anxiety    Breast cancer [facility])    Cataract    surgical repair bilateral   Depression    ESRD on hemodialysis ([place])    Home HD 5x per week- not on dialysis now had tramsplant 4/17   Family history of ovarian cancer 02/22/2021   GERD (gastroesophageal reflux disease)    Hearing loss 2017   right ear   History of blood transfusion    transfusion reaction   Hyperlipidemia    Hypertension    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 per pt-dr levy also II   Kidney transplant recipient    Neuromuscular disorder ([place])    NEUROPATHY   Sleep apnea     Past  Surgical History:  Procedure Laterality Date   ABDOMINAL HYSTERECTOMY     BREAST EXCISIONAL BIOPSY Right 08/2015   BREAST LUMPECTOMY WITH RADIOACTIVE SEED LOCALIZATION Right 09/14/2015   Procedure: RIGHT BREAST LUMPECTOMY WITH RADIOACTIVE SEED LOCALIZATION;  Surgeon: Donnie Mesa, MD;  Location: [place];  Service: General;  Laterality: Right;   BREAST SURGERY Bilateral    biopsy bilateral   CATARACT EXTRACTION Bilateral    bilateral   CHOLECYSTECTOMY     CYST REMOVAL NECK     DIALYSIS FISTULA CREATION Left    EYE SURGERY Bilateral    lazer   LEFT HEART CATH AND CORONARY ANGIOGRAPHY N/A 03/04/2019   Procedure: LEFT HEART CATH AND CORONARY  ANGIOGRAPHY;  Surgeon: Martinique, Peter M, MD;  Location: [place] CV LAB;  Service: Cardiovascular;  Laterality: N/A;   LEFT HEART CATHETERIZATION WITH CORONARY ANGIOGRAM N/A 03/30/2014   Procedure: LEFT HEART CATHETERIZATION WITH CORONARY ANGIOGRAM;  Surgeon: Sinclair Grooms, MD;  Location: [facility] CATH LAB;  Service: Cardiovascular;  Laterality: N/A;   LIPOMA EXCISION N/A 02/06/2017   Procedure: EXCISION POSTERIOR NECK SEBACEOUS CYST;  Surgeon: Coralie Keens, MD;  Location: [place];  Service: General;  Laterality: N/A;   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   Procedure: REPAIR OF ARTERIOVENOUS FISTULA ANEURYSM;  Surgeon: Serafina Mitchell, MD;  Location: [facility] OR;  Service: Vascular;  Laterality: Left;   REVISON OF ARTERIOVENOUS FISTULA Left 09/28/2013   Procedure: EXCISE ESCHAR LEFT ARM  ARTERIOVENOUS FISTULA WITH PLICATION OF LEFT ARM ARTERIOVENOUS FISTULA;  Surgeon: Elam Dutch, MD;  Location: [place];  Service: Vascular;  Laterality: Left;   REVISON OF ARTERIOVENOUS FISTULA Left 08/26/2015   Procedure: RESECTION ANEURYSM OF LEFT ARM ARTERIOVENOUS FISTULA  ;  Surgeon: Serafina Mitchell, MD;  Location: [facility] OR;  Service: Vascular;  Laterality: Left;   TUBAL LIGATION      Current Medications: Current Outpatient Medications on File Prior to Visit  Medication Sig   Accu-Chek Softclix Lancets lancets Use as instructed   aspirin EC 81 MG EC tablet Take 1 tablet (81 mg total) by mouth daily. (Patient taking differently: Take 81 mg by mouth at bedtime.)   B Complex-C (SUPER B COMPLEX PO) Take 1 tablet by mouth daily.    Blood Glucose Monitoring Suppl (ACCU-CHEK AVIVA CONNECT) w/Device KIT Check sugar 1x daily   carvedilol (COREG) 25 MG tablet Take 12.5 mg by mouth 2 (two) times daily with a meal.    cetirizine (ZYRTEC) 10 MG tablet Take 5 mg by mouth daily. Takes 0.5 tablet daily   Cholecalciferol (VITAMIN D3) 1000 units CAPS Take 1,000 Units by mouth daily.    fludrocortisone  (FLORINEF) 0.1 MG tablet Take 0.1 mg by mouth daily.   FLUoxetine (PROZAC) 40 MG capsule Take 40 mg by mouth daily.   furosemide (LASIX) 40 MG tablet Take 40 mg by mouth 2 (two) times daily.   gabapentin (NEURONTIN) 300 MG capsule Take 300 mg by mouth at bedtime.   glucose blood (ACCU-CHEK AVIVA PLUS) test strip Check sugar 1x daily   insulin glargine (LANTUS SOLOSTAR) 100 UNIT/ML Solostar Pen Inject 65 Units into the skin every morning.   Insulin Pen Needle (PEN NEEDLES) 30G X 8 MM MISC 1 each by Does not apply route daily. E11.9   magnesium oxide (MAG-OX) 400 MG tablet Take 400 mg by mouth 2 (two) times daily.    mycophenolate (MYFORTIC) 180 MG EC tablet Take 360 mg by mouth 2 (two) times  daily.   pantoprazole (PROTONIX) 40 MG tablet Take 40 mg by mouth daily.   predniSONE (DELTASONE) 5 MG tablet Take 5 mg by mouth daily.    rosuvastatin (CRESTOR) 5 MG tablet Take 5 mg by mouth at bedtime.   Tacrolimus ER (ENVARSUS XR) 1 MG TB24 Take 3 mg by mouth every morning.   vitamin E 400 UNIT capsule Take 400 Units by mouth daily.   ibuprofen (ADVIL) 800 MG tablet Take 1 tablet (800 mg total) by mouth every 8 (eight) hours as needed.   oxyCODONE (OXY IR/ROXICODONE) 5 MG immediate release tablet Take 1 tablet (5 mg total) by mouth every 6 (six) hours as needed for severe pain.   No current facility-administered medications on file prior to visit.     Allergies:   Other, Norvasc [amlodipine], and Tape   Social History   Tobacco Use   Smoking status: Never   Smokeless tobacco: Never  Vaping Use   Vaping Use: Never used  Substance Use Topics   Alcohol use: Yes    Comment: Socially   Drug use: No    Family History: family history includes Asthma in her sister; Cancer (age of onset: 2) in her maternal grandmother; Diabetes in her brother, brother, brother, brother, father, maternal grandfather, mother, paternal grandmother, and sister; Heart disease in her father; Hypertension in her father;  Kidney disease in her father and mother; Lupus in her sister. There is no history of Colon cancer, Colon polyps, Esophageal cancer, Rectal cancer, or Stomach cancer.  ROS:   Please see the history of present illness.  Additional pertinent ROS: Constitutional: Negative for chills, fever, night sweats, unintentional weight loss  HENT: Negative for ear pain and hearing loss.   Eyes: Negative for loss of vision and eye pain.  Respiratory: Negative for cough, sputum, wheezing.   Cardiovascular: See HPI. Gastrointestinal: Negative for abdominal pain, melena, and hematochezia.  Genitourinary: Negative for dysuria and hematuria.  Musculoskeletal: Negative for falls and myalgias.  Skin: Negative for itching and rash.  Neurological: Negative for focal weakness, focal sensory changes and loss of consciousness.  Endo/Heme/Allergies: Does not bruise/bleed easily.     EKGs/Labs/Other Studies Reviewed:    The following studies were reviewed today: Cath 03/04/2019 Prox LAD to Mid LAD lesion is 10% stenosed. Prox Cx to Mid Cx lesion is 20% stenosed. 1st Mrg lesion is 20% stenosed. Prox RCA to Mid RCA lesion is 10% stenosed. The left ventricular systolic function is normal. LV end diastolic pressure is normal. The left ventricular ejection fraction is 55-65% by visual estimate.   1. Minor nonobstructive CAD 2. Normal LV function 3. Normal LVEDP  Echo 01/06/2015  - Left ventricle: The cavity size was normal. Wall thickness was    increased in a pattern of mild LVH. Systolic function was normal.    The estimated ejection fraction was in the range of 55% to 60%.    Wall motion was normal; there were no regional wall motion    abnormalities.  - Mitral valve: Calcified annulus. There was mild regurgitation.  - Atrial septum: No defect or patent foramen ovale was identified.   EKG:  EKG is personally reviewed.   03/01/21 NSR, LAFB, t wave inversion I, aVL  Recent Labs: 02/28/2021: ALT 47; BUN 25;  Creatinine, Ser 1.58; Hemoglobin 13.9; Platelets 175; Potassium 4.4; Sodium 139  Recent Lipid Panel    Component Value Date/Time   CHOL 162 03/29/2014 0616   TRIG 284 (H) 03/29/2014 0616   HDL  31 (L) 03/29/2014 0616   CHOLHDL 5.2 03/29/2014 0616   VLDL 57 (H) 03/29/2014 0616   LDLCALC 74 03/29/2014 0616    Physical Exam:    VS:  BP 118/74 (BP Location: Right Arm, Patient Position: Sitting)   Pulse 64   Ht '5\' 1"'  (1.549 m)   Wt 207 lb (93.9 kg)   BMI 39.11 kg/m     Wt Readings from Last 3 Encounters:  03/02/21 206 lb 9.1 oz (93.7 kg)  03/01/21 207 lb (93.9 kg)  02/22/21 210 lb (95.3 kg)    GEN: Well nourished, well developed in no acute distress HEENT: Normal, moist mucous membranes NECK: No JVD CARDIAC: regular rhythm, normal S1 and S2, no rubs or gallops. There is a prominent murmur from her fistula, and I do not appreciate a separate MR murmur VASCULAR: Radial and DP pulses 2+ bilaterally. No carotid bruits RESPIRATORY:  Clear to auscultation without rales, wheezing or rhonchi  ABDOMEN: Soft, non-tender, non-distended MUSCULOSKELETAL:  Ambulates independently SKIN: Warm and dry, no edema NEUROLOGIC:  Alert and oriented x 3. No focal neuro deficits noted. PSYCHIATRIC:  Normal affect    ASSESSMENT:    1. Mitral valve insufficiency, unspecified etiology   2. Primary hypertension   3. Hypercholesterolemia   4. Type 1 diabetes mellitus with other kidney complication (HCC)   5. Kidney transplant status   6. Cardiac risk counseling   7. Counseling on health promotion and disease prevention   8. Aortic atherosclerosis ([place])   9. Shortness of breath    PLAN:    Recent edema: now resolved Shortness of breath Concern for mitral regurgitation -had echo, stress test done with Dr. Claudie Leach in [facility]. We have requested records. -has loud bruit/murmur from fistula, but did not appreciate separate MR murmur today -she was told MR was not severe. Will need to confirm on  echo. -per notes, on furosemide 40 mg BID with resolution in LE edema but continued shortness of breath  Type I diabetes, with kidney complications, now s/p kidney transplant -follows with Dr. Joelyn Oms at [place] transplant nephrology  Hyperlipidemia with diabetes Aortic atherosclerosis noted on CT 06/01/2019 -on aspirin 81 mg, rosuvastatin  Hypertension: at goal of <130/80 today -continue carvedilol, furosemide  Cardiac risk counseling and prevention recommendations: -recommend heart healthy/Mediterranean diet, with whole grains, fruits, vegetable, fish, lean meats, nuts, and olive oil. Limit salt. -recommend moderate walking, 3-5 times/week for 30-50 minutes each session. Aim for at least 150 minutes.week. Goal should be pace of 3 miles/hours, or walking 1.5 miles in 30 minutes -recommend avoidance of tobacco products. Avoid excess alcohol. -ASCVD risk score: The 10-year ASCVD risk score Mikey Bussing DC Brooke Bonito., et al., 2013) is: 3.8%   Values used to calculate the score:     Age: 32 years     Sex: Female     Is Non-Hispanic African American: No     Diabetic: Yes     Tobacco smoker: No     Systolic Blood Pressure: 161 mmHg     Is BP treated: Yes     HDL Cholesterol: 67 mg/dL     Total Cholesterol: 248 mg/dL    Plan for follow up: 2 mos or sooner as needed. If we cannot get records, may need to repeat echocardiogram. She is having breast excisional surgery tomorrow, would allow site to heal prior to attempting repeat echo  Buford Dresser, MD, PhD, [facility]    Medication Adjustments/Labs and Tests Ordered:  Current medicines are reviewed at length with the patient today.  Concerns regarding medicines are outlined above.  Orders Placed This Encounter  Procedures   EKG 12-Lead   No orders of the defined types were placed in this encounter.   Patient Instructions  Medication Instructions:  Your Physician recommend you continue on your  current medication as directed.    *If you need a refill on your cardiac medications before your next appointment, please call your pharmacy*   Lab Work: None ordered today   Testing/Procedures: None ordered today   Follow-Up: At [facility], you and your health needs are our priority.  As part of our continuing mission to provide you with exceptional heart care, we have created designated Provider Care Teams.  These Care Teams include your primary Cardiologist (physician) and Advanced Practice Providers (APPs -  Physician Assistants and Nurse Practitioners) who all work together to provide you with the care you need, when you need it.  We recommend signing up for the patient portal called "MyChart".  Sign up information is provided on this After Visit Summary.  MyChart is used to connect with patients for Virtual Visits (Telemedicine).  Patients are able to view lab/test results, encounter notes, upcoming appointments, etc.  Non-urgent messages can be sent to your provider as well.   To learn more about what you can do with MyChart, go to [email or web address].    Your next appointment:   2 month(s)  The format for your next appointment:   In Person  Provider:   Buford Dresser, MD    Signed, Buford Dresser, MD PhD 03/01/2021    [place]

## 2021-03-01 NOTE — Patient Instructions (Signed)
Medication Instructions:  Your Physician recommend you continue on your current medication as directed.    *If you need a refill on your cardiac medications before your next appointment, please call your pharmacy*   Lab Work: None ordered today   Testing/Procedures: None ordered today   Follow-Up: At [facility], you and your health needs are our priority.  As part of our continuing mission to provide you with exceptional heart care, we have created designated Provider Care Teams.  These Care Teams include your primary Cardiologist (physician) and Advanced Practice Providers (APPs -  Physician Assistants and Nurse Practitioners) who all work together to provide you with the care you need, when you need it.  We recommend signing up for the patient portal called "MyChart".  Sign up information is provided on this After Visit Summary.  MyChart is used to connect with patients for Virtual Visits (Telemedicine).  Patients are able to view lab/test results, encounter notes, upcoming appointments, etc.  Non-urgent messages can be sent to your provider as well.   To learn more about what you can do with MyChart, go to [email or web address].    Your next appointment:   2 month(s)  The format for your next appointment:   In Person  Provider:   Bridgette Christopher, MD{      

## 2021-03-01 NOTE — Telephone Encounter (Signed)
I contacted Cynthia [name] to discuss her genetic testing results (8 genes). Of note, the panel will reflex to additional genes and we will receive those results within 2-3 weeks. 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 We reviewed NCCN guidelines with a focus on her risk for a second primary breast cancer. Her surgery is tomorrow and we discussed whether or not Cynthia [name] would like to proceed with a lumpectomy or pursue a mastectomy. She stated that she would like to continue with her surgery as planned and have high risk breast cancer screening. We discussed the option to return for an in person results disclosure after her recovery and Cynthia [name] said she would like to. I will call her towards the end of the month to schedule a follow-up appointment once she has a better idea of her schedule.

## 2021-03-01 NOTE — Anesthesia Preprocedure Evaluation (Addendum)
Anesthesia Evaluation  Patient identified by MRN, date of birth, ID band Patient awake    Reviewed: Allergy & Precautions, NPO status , Patient's Chart, lab work & pertinent test results  Airway Mallampati: II  TM Distance: >3 FB Neck ROM: Full    Dental no notable dental hx. (+) Teeth Intact, Dental Advisory Given   Pulmonary sleep apnea and Continuous Positive Airway Pressure Ventilation ,    Pulmonary exam normal breath sounds clear to auscultation       Cardiovascular hypertension, Pt. on medications Normal cardiovascular exam Rhythm:Regular Rate:Normal  02/2019 Heart cath ? Prox LAD to Mid LAD lesion is 10% stenosed. ? Prox Cx to Mid Cx lesion is 20% stenosed. ? 1st Mrg lesion is 20% stenosed. ? Prox RCA to Mid RCA lesion is 10% stenosed. ? The left ventricular systolic function is normal. ? LV end diastolic pressure is normal. ? The left ventricular ejection fraction is 55-65% by visual estimate.   1. Minor nonobstructive CAD 2. Normal LV function 3. Normal LVEDP     Neuro/Psych    GI/Hepatic GERD  ,  Endo/Other  diabetes, Type 1  Renal/GU Renal diseaseS/P transplant     Musculoskeletal   Abdominal (+) + obese (BMI 39.11),   Peds  Hematology   Anesthesia Other Findings L breast Ca  Reproductive/Obstetrics                            Anesthesia Physical Anesthesia Plan  ASA: 3  Anesthesia Plan: General   Post-op Pain Management:  Regional for Post-op pain   Induction: Intravenous  PONV Risk Score and Plan: Treatment may vary due to age or medical condition, Midazolam, Ondansetron and Dexamethasone  Airway Management Planned: LMA  Additional Equipment: None  Intra-op Plan:   Post-operative Plan:   Informed Consent: I have reviewed the patients History and Physical, chart, labs and discussed the procedure including the risks, benefits and alternatives for the proposed  anesthesia with the patient or authorized representative who has indicated his/her understanding and acceptance.     Interpreter used for [employer] and Engineer, production given  Plan Discussed with: CRNA and Anesthesiologist  Anesthesia Plan Comments: (GA w R PEC block)       Anesthesia Quick Evaluation

## 2021-03-02 ENCOUNTER — Encounter: Payer: Self-pay | Admitting: *Deleted

## 2021-03-02 ENCOUNTER — Ambulatory Visit (HOSPITAL_COMMUNITY)
Admission: RE | Admit: 2021-03-02 | Discharge: 2021-03-02 | Disposition: A | Discharge status: Home / Self Care | Payer: Medicare HMO | Source: Ambulatory Visit | Attending: Surgery | Admitting: Surgery

## 2021-03-02 ENCOUNTER — Encounter (HOSPITAL_BASED_OUTPATIENT_CLINIC_OR_DEPARTMENT_OTHER)
Admission: RE | Disposition: A | Discharge status: Home / Self Care | Payer: Self-pay | Source: Home / Self Care | Attending: Surgery

## 2021-03-02 ENCOUNTER — Encounter (HOSPITAL_BASED_OUTPATIENT_CLINIC_OR_DEPARTMENT_OTHER): Payer: Self-pay | Admitting: Surgery

## 2021-03-02 ENCOUNTER — Ambulatory Visit (HOSPITAL_BASED_OUTPATIENT_CLINIC_OR_DEPARTMENT_OTHER): Payer: Medicare HMO | Admitting: Anesthesiology

## 2021-03-02 ENCOUNTER — Ambulatory Visit (HOSPITAL_BASED_OUTPATIENT_CLINIC_OR_DEPARTMENT_OTHER)
Admission: RE | Admit: 2021-03-02 | Discharge: 2021-03-02 | Disposition: A | Discharge status: Home / Self Care | Payer: Medicare HMO | Attending: Surgery | Admitting: Surgery

## 2021-03-02 ENCOUNTER — Other Ambulatory Visit: Payer: Self-pay

## 2021-03-02 DIAGNOSIS — Z7982 Long term (current) use of aspirin: Secondary | ICD-10-CM | POA: Insufficient documentation

## 2021-03-02 DIAGNOSIS — C50912 Malignant neoplasm of unspecified site of left female breast: Secondary | ICD-10-CM

## 2021-03-02 DIAGNOSIS — Z79899 Other long term (current) drug therapy: Secondary | ICD-10-CM | POA: Insufficient documentation

## 2021-03-02 DIAGNOSIS — Z7952 Long term (current) use of systemic steroids: Secondary | ICD-10-CM | POA: Insufficient documentation

## 2021-03-02 DIAGNOSIS — C50312 Malignant neoplasm of lower-inner quadrant of left female breast: Secondary | ICD-10-CM | POA: Diagnosis not present

## 2021-03-02 DIAGNOSIS — Z940 Kidney transplant status: Secondary | ICD-10-CM | POA: Insufficient documentation

## 2021-03-02 DIAGNOSIS — Z1501 Genetic susceptibility to malignant neoplasm of breast: Secondary | ICD-10-CM | POA: Diagnosis not present

## 2021-03-02 DIAGNOSIS — Z170 Estrogen receptor positive status [ER+]: Secondary | ICD-10-CM

## 2021-03-02 DIAGNOSIS — Z888 Allergy status to other drugs, medicaments and biological substances status: Secondary | ICD-10-CM | POA: Insufficient documentation

## 2021-03-02 DIAGNOSIS — Z794 Long term (current) use of insulin: Secondary | ICD-10-CM | POA: Insufficient documentation

## 2021-03-02 HISTORY — PX: BREAST LUMPECTOMY WITH RADIOACTIVE SEED LOCALIZATION: SHX6424

## 2021-03-02 HISTORY — DX: Sleep apnea, unspecified: G47.30

## 2021-03-02 SURGERY — BREAST LUMPECTOMY WITH RADIOACTIVE SEED LOCALIZATION
Anesthesia: General | Site: Breast | Laterality: Left

## 2021-03-02 MED ORDER — ONDANSETRON HCL 4 MG/2ML IJ SOLN
INTRAMUSCULAR | Status: AC
  Filled 2021-03-02: qty 2

## 2021-03-02 MED ORDER — ROPIVACAINE HCL 5 MG/ML IJ SOLN
INTRAMUSCULAR | Status: DC | PRN
  Administered 2021-03-02: 30 mL

## 2021-03-02 MED ORDER — OXYCODONE HCL 5 MG/5ML PO SOLN: 5.0000 mg | Freq: Once | ORAL | Status: DC | PRN

## 2021-03-02 MED ORDER — BUPIVACAINE-EPINEPHRINE (PF) 0.25% -1:200000 IJ SOLN
INTRAMUSCULAR | Status: AC
  Filled 2021-03-02: qty 30

## 2021-03-02 MED ORDER — DEXMEDETOMIDINE (PRECEDEX) IN NS 20 MCG/5ML (4 MCG/ML) IV SYRINGE
PREFILLED_SYRINGE | INTRAVENOUS | Status: AC
  Filled 2021-03-02: qty 5

## 2021-03-02 MED ORDER — CEFAZOLIN SODIUM-DEXTROSE 2-4 GM/100ML-% IV SOLN
2.0000 g | INTRAVENOUS | Status: AC
Start: 2021-03-02 — End: 2021-03-02
  Administered 2021-03-02: 2 g via INTRAVENOUS

## 2021-03-02 MED ORDER — ONDANSETRON HCL 4 MG/2ML IJ SOLN
INTRAMUSCULAR | Status: DC | PRN
  Administered 2021-03-02: 4 mg via INTRAVENOUS

## 2021-03-02 MED ORDER — HYDROMORPHONE HCL 1 MG/ML IJ SOLN
0.2500 mg | INTRAMUSCULAR | Status: DC | PRN
  Administered 2021-03-02: 0.5 mg via INTRAVENOUS

## 2021-03-02 MED ORDER — FENTANYL CITRATE (PF) 100 MCG/2ML IJ SOLN
INTRAMUSCULAR | Status: AC
  Filled 2021-03-02: qty 2

## 2021-03-02 MED ORDER — PROPOFOL 500 MG/50ML IV EMUL
INTRAVENOUS | Status: AC
  Filled 2021-03-02: qty 50

## 2021-03-02 MED ORDER — MIDAZOLAM HCL 2 MG/2ML IJ SOLN
INTRAMUSCULAR | Status: AC
  Filled 2021-03-02: qty 2

## 2021-03-02 MED ORDER — MIDAZOLAM HCL 2 MG/2ML IJ SOLN
2.0000 mg | Freq: Once | INTRAMUSCULAR | Status: AC
  Administered 2021-03-02: 2 mg via INTRAVENOUS

## 2021-03-02 MED ORDER — LACTATED RINGERS IV SOLN: INTRAVENOUS | Status: DC

## 2021-03-02 MED ORDER — TECHNETIUM TC 99M TILMANOCEPT KIT
1.0000 | PACK | Freq: Once | INTRAVENOUS | Status: AC | PRN
  Administered 2021-03-02: 1 via INTRADERMAL

## 2021-03-02 MED ORDER — LIDOCAINE HCL (CARDIAC) PF 100 MG/5ML IV SOSY
PREFILLED_SYRINGE | INTRAVENOUS | Status: DC | PRN
  Administered 2021-03-02: 100 mg via INTRAVENOUS

## 2021-03-02 MED ORDER — BUPIVACAINE-EPINEPHRINE (PF) 0.25% -1:200000 IJ SOLN
INTRAMUSCULAR | Status: DC | PRN
  Administered 2021-03-02: 23 mL via PERINEURAL

## 2021-03-02 MED ORDER — IBUPROFEN 800 MG PO TABS: 800.0000 mg | ORAL_TABLET | Freq: Three times a day (TID) | ORAL | 0 refills | Status: DC | PRN

## 2021-03-02 MED ORDER — SODIUM CHLORIDE 0.9 % IV SOLN
INTRAVENOUS | Status: AC
  Filled 2021-03-02: qty 10

## 2021-03-02 MED ORDER — HYDROMORPHONE HCL 1 MG/ML IJ SOLN
INTRAMUSCULAR | Status: AC
  Filled 2021-03-02: qty 0.5

## 2021-03-02 MED ORDER — DEXAMETHASONE SODIUM PHOSPHATE 4 MG/ML IJ SOLN
INTRAMUSCULAR | Status: DC | PRN
  Administered 2021-03-02: 8 mg via INTRAVENOUS

## 2021-03-02 MED ORDER — OXYCODONE HCL 5 MG PO TABS: 5.0000 mg | ORAL_TABLET | Freq: Four times a day (QID) | ORAL | 0 refills | Status: DC | PRN

## 2021-03-02 MED ORDER — NON FORMULARY
Status: DC | PRN
  Administered 2021-03-02: 2 mL via INTRAMUSCULAR

## 2021-03-02 MED ORDER — ACETAMINOPHEN 10 MG/ML IV SOLN: 1000.0000 mg | Freq: Once | INTRAVENOUS | Status: DC | PRN

## 2021-03-02 MED ORDER — LIDOCAINE 2% (20 MG/ML) 5 ML SYRINGE
INTRAMUSCULAR | Status: AC
  Filled 2021-03-02: qty 5

## 2021-03-02 MED ORDER — DEXMEDETOMIDINE (PRECEDEX) IN NS 20 MCG/5ML (4 MCG/ML) IV SYRINGE
PREFILLED_SYRINGE | INTRAVENOUS | Status: DC | PRN
  Administered 2021-03-02: 8 ug via INTRAVENOUS

## 2021-03-02 MED ORDER — VANCOMYCIN HCL 1000 MG IV SOLR
INTRAVENOUS | Status: AC
  Filled 2021-03-02: qty 20

## 2021-03-02 MED ORDER — CHLORHEXIDINE GLUCONATE CLOTH 2 % EX PADS
6.0000 | MEDICATED_PAD | Freq: Once | CUTANEOUS | Status: DC
Start: 2021-03-02 — End: 2021-03-02

## 2021-03-02 MED ORDER — DEXAMETHASONE SODIUM PHOSPHATE 10 MG/ML IJ SOLN
INTRAMUSCULAR | Status: AC
  Filled 2021-03-02: qty 1

## 2021-03-02 MED ORDER — AMISULPRIDE (ANTIEMETIC) 5 MG/2ML IV SOLN: 10.0000 mg | Freq: Once | INTRAVENOUS | Status: DC | PRN

## 2021-03-02 MED ORDER — FENTANYL CITRATE (PF) 100 MCG/2ML IJ SOLN
INTRAMUSCULAR | Status: DC | PRN
  Administered 2021-03-02 (×2): 50 ug via INTRAVENOUS

## 2021-03-02 MED ORDER — PROPOFOL 10 MG/ML IV BOLUS
INTRAVENOUS | Status: DC | PRN
  Administered 2021-03-02: 150 mg via INTRAVENOUS

## 2021-03-02 MED ORDER — CHLORHEXIDINE GLUCONATE CLOTH 2 % EX PADS: 6.0000 | MEDICATED_PAD | Freq: Once | CUTANEOUS | Status: DC

## 2021-03-02 MED ORDER — CEFAZOLIN SODIUM-DEXTROSE 2-4 GM/100ML-% IV SOLN
INTRAVENOUS | Status: AC
  Filled 2021-03-02: qty 100

## 2021-03-02 MED ORDER — ONDANSETRON HCL 4 MG/2ML IJ SOLN: 4.0000 mg | Freq: Once | INTRAMUSCULAR | Status: DC | PRN

## 2021-03-02 MED ORDER — FENTANYL CITRATE (PF) 100 MCG/2ML IJ SOLN
50.0000 ug | Freq: Once | INTRAMUSCULAR | Status: AC
  Administered 2021-03-02: 50 ug via INTRAVENOUS

## 2021-03-02 MED ORDER — PROPOFOL 500 MG/50ML IV EMUL
INTRAVENOUS | Status: DC | PRN
  Administered 2021-03-02: 120 ug/kg/min via INTRAVENOUS

## 2021-03-02 MED ORDER — CLONIDINE HCL (ANALGESIA) 100 MCG/ML EP SOLN
EPIDURAL | Status: DC | PRN
  Administered 2021-03-02: 100 ug

## 2021-03-02 MED ORDER — SODIUM CHLORIDE 0.9 % IV SOLN
INTRAVENOUS | Status: DC | PRN
  Administered 2021-03-02: 500 mL

## 2021-03-02 MED ORDER — SODIUM CHLORIDE (PF) 0.9 % IJ SOLN
INTRAMUSCULAR | Status: AC
  Filled 2021-03-02: qty 10

## 2021-03-02 MED ORDER — METHYLENE BLUE 0.5 % INJ SOLN
INTRAVENOUS | Status: AC
  Filled 2021-03-02: qty 10

## 2021-03-02 MED ORDER — OXYCODONE HCL 5 MG PO TABS: 5.0000 mg | ORAL_TABLET | Freq: Once | ORAL | Status: DC | PRN

## 2021-03-02 SURGICAL SUPPLY — 51 items
ADH SKN CLS APL DERMABOND .7 (GAUZE/BANDAGES/DRESSINGS) ×1
APPLIER CLIP 9.375 MED OPEN (MISCELLANEOUS) ×2
BINDER BREAST LRG (GAUZE/BANDAGES/DRESSINGS) IMPLANT
BINDER BREAST MEDIUM (GAUZE/BANDAGES/DRESSINGS) IMPLANT
BINDER BREAST XLRG (GAUZE/BANDAGES/DRESSINGS) ×2 IMPLANT
BINDER BREAST XXLRG (GAUZE/BANDAGES/DRESSINGS) IMPLANT
BLADE SURG 15 STRL LF DISP TIS (BLADE) ×1 IMPLANT
BLADE SURG 15 STRL SS (BLADE) ×2
CANISTER SUC SOCK COL 7IN (MISCELLANEOUS) IMPLANT
CANISTER SUCT 1200ML W/VALVE (MISCELLANEOUS) ×2 IMPLANT
CHLORAPREP W/TINT 26 (MISCELLANEOUS) ×2 IMPLANT
CLIP APPLIE 9.375 MED OPEN (MISCELLANEOUS) ×1 IMPLANT
COVER BACK TABLE 60X90IN (DRAPES) ×2 IMPLANT
COVER MAYO STAND STRL (DRAPES) ×2 IMPLANT
COVER PROBE W GEL 5X96 (DRAPES) ×4 IMPLANT
DECANTER SPIKE VIAL GLASS SM (MISCELLANEOUS) IMPLANT
DERMABOND ADVANCED (GAUZE/BANDAGES/DRESSINGS) ×1
DERMABOND ADVANCED .7 DNX12 (GAUZE/BANDAGES/DRESSINGS) ×1 IMPLANT
DRAPE LAPAROSCOPIC ABDOMINAL (DRAPES) IMPLANT
DRAPE LAPAROTOMY 100X72 PEDS (DRAPES) ×2 IMPLANT
DRAPE UTILITY XL STRL (DRAPES) ×2 IMPLANT
ELECT COATED BLADE 2.86 ST (ELECTRODE) ×2 IMPLANT
ELECT REM PT RETURN 9FT ADLT (ELECTROSURGICAL) ×2
ELECTRODE REM PT RTRN 9FT ADLT (ELECTROSURGICAL) ×1 IMPLANT
GLOVE SRG 8 PF TXTR STRL LF DI (GLOVE) ×1 IMPLANT
GLOVE SURG LTX SZ8 (GLOVE) ×2 IMPLANT
GLOVE SURG POLYISO LF SZ7.5 (GLOVE) ×2 IMPLANT
GLOVE SURG UNDER POLY LF SZ7 (GLOVE) ×4 IMPLANT
GLOVE SURG UNDER POLY LF SZ8 (GLOVE) ×2
GOWN STRL REUS W/ TWL LRG LVL3 (GOWN DISPOSABLE) IMPLANT
GOWN STRL REUS W/ TWL XL LVL3 (GOWN DISPOSABLE) ×1 IMPLANT
GOWN STRL REUS W/TWL LRG LVL3 (GOWN DISPOSABLE)
GOWN STRL REUS W/TWL XL LVL3 (GOWN DISPOSABLE) ×4 IMPLANT
HEMOSTAT ARISTA ABSORB 3G PWDR (HEMOSTASIS) ×4 IMPLANT
HEMOSTAT SNOW SURGICEL 2X4 (HEMOSTASIS) IMPLANT
KIT MARKER MARGIN INK (KITS) ×2 IMPLANT
NEEDLE HYPO 25X1 1.5 SAFETY (NEEDLE) ×4 IMPLANT
NS IRRIG 1000ML POUR BTL (IV SOLUTION) ×2 IMPLANT
PACK BASIN DAY SURGERY FS (CUSTOM PROCEDURE TRAY) ×2 IMPLANT
PENCIL SMOKE EVACUATOR (MISCELLANEOUS) ×2 IMPLANT
SLEEVE SCD COMPRESS KNEE MED (STOCKING) ×2 IMPLANT
SPONGE T-LAP 4X18 ~~LOC~~+RFID (SPONGE) ×4 IMPLANT
SUT MNCRL AB 4-0 PS2 18 (SUTURE) ×2 IMPLANT
SUT SILK 2 0 SH (SUTURE) IMPLANT
SUT VICRYL 3-0 CR8 SH (SUTURE) ×2 IMPLANT
SYR CONTROL 10ML LL (SYRINGE) ×2 IMPLANT
TOWEL GREEN STERILE FF (TOWEL DISPOSABLE) ×2 IMPLANT
TRACER MAGTRACE VIAL (MISCELLANEOUS) ×2 IMPLANT
TRAY FAXITRON CT DISP (TRAY / TRAY PROCEDURE) ×2 IMPLANT
TUBE CONNECTING 20X1/4 (TUBING) ×2 IMPLANT
YANKAUER SUCT BULB TIP NO VENT (SUCTIONS) ×2 IMPLANT

## 2021-03-02 NOTE — H&P (Signed)
Subjective   Chief Complaint: No chief complaint on file.   History of Present Illness: Cynthia [name] is a 50 y.o. female who is seen today as an office consultation at the request of Dr. Doreene Nest for evaluation of LEFT BREAST CANCER.   Pt seen today in the [facility] for stage 1 left breast cancer. No hx of mass , discharge or breast change. Dx by screening mammogram. On HD fistula left arm. Hx of renal transplant.   Review of Systems: A complete review of systems was obtained from the patient. I have reviewed this information and discussed as appropriate with the patient. See HPI as well for other ROS.    Medical History: Past Medical History:  Diagnosis Date   Diabetes mellitus type 2, uncomplicated (CMS-HCC)  Diagnosed age 69 years old   Encounter for blood transfusion   End stage renal disease on dialysis (CMS-HCC)   Hypertension 2010   Neuropathy   Urinary tract infection  Initially with dialysis but improved   Patient Active Problem List  Diagnosis   End stage renal disease on dialysis (CMS-HCC)   Hypertension   Diabetes mellitus type 2, uncomplicated (CMS-HCC)   Neuropathy (CMS-HCC)   S/P kidney transplant   Immunosuppression (CMS-HCC)   Need for prophylactic antibiotic   History of depression   Delayed graft function of kidney   Red blood cell antibody positive   Anemia, unspecified   Acute UTI   Abdominal pain, right lower quadrant   First degree hemorrhoids   Chest pain, rule out acute myocardial infarction   Malignant neoplasm of lower-inner quadrant of left breast in female, estrogen receptor positive (CMS-HCC)   Diabetic nephropathy (CMS-HCC)   Diabetic retinopathy associated with type 2 diabetes mellitus (CMS-HCC)   Past Surgical History:  Procedure Laterality Date   AV Fistula Left  upper   CHOLECYSTECTOMY 1995  [place]; Open   HYSTERECTOMY 2001  [place]-ulcers   LAPAROSCOPIC TUBAL LIGATION   PERCUTANEOUS BIOPSY BREAST Right 2012  Benign    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 Schmitz/ Vikraman-Sushama    Allergies  Allergen Reactions   Adhesive Rash  Burns skin.  Please use paper tape only Burns skin.  Please use paper tape only   Adhesive Tape-Silicones Other (See Comments)  Burns skin. Please use paper tape only   Current Outpatient Medications on File Prior to Visit  Medication Sig Dispense Refill   amLODIPine (NORVASC) 10 MG tablet Take 1 tablet (10 mg total) by mouth once daily 60 tablet 11   carvediloL (COREG) 25 MG tablet Take 0.5 tablet (12.5 mg total) by mouth 2 (two) times daily with meals (Patient taking differently: Take 25 mg by mouth 2 (two) times daily with meals 1 tablet in the morning and 1 tablet in the evening) 90 tablet 3   cetirizine (ZYRTEC) 10 mg capsule Take 10 mg by mouth once daily.   insulin GLARGINE (LANTUS SOLOSTAR, BASAGLAR KWIKPEN) pen injector (concentration 100 units/mL) Inject 16 Units subcutaneously nightly (Patient taking differently: Inject 12 Units subcutaneously nightly) 15 mL 11   rosuvastatin (CRESTOR) 5 MG tablet TAKE 1 TABLET(5 MG) BY MOUTH AT BEDTIME   aspirin 81 MG EC tablet Take 1 tablet (81 mg total) by mouth once daily. 120 tablet 0   blood pressure monitor Kit Use 1 each as directed Check BP twice  daily --hypertension post renal transplant, ICD-10 code is I10 1 each 0   cholecalciferol (VITAMIN D3) 1,000 unit capsule Take 1,000 Units by mouth once daily   cyanocobalamin (VITAMIN B12) 1000 MCG tablet Take 1,000 mcg by mouth once daily.   docusate (COLACE) 100 MG capsule Take 100 mg by mouth 2 (two) times daily.   ENVARSUS XR 1 mg extended-release tablet Take 3 tablets (3 mg total) by mouth every morning before breakfast 180 tablet 3   FLUoxetine  (PROZAC) 20 MG tablet Take 1 tablet (20 mg total) by mouth once daily. 30 tablet 10   FREESTYLE LANCETS lancets Use 1 each 4 (four) times daily 400 each 3   FREESTYLE LITE STRIPS test strip Use 4 (four) times daily Use as instructed to test blood sugar 400 each 3   gabapentin (NEURONTIN) 300 MG capsule Take 1 capsule (300 mg total) by mouth nightly for 90 days Patient will need appointment in order to obtain refills. (Patient taking differently: Take 300 mg by mouth nightly 2 tablets nightly) 90 capsule 30   insulin LISPRO (HUMALOG) injection (concentration 100 units/mL) Inject 7 Units subcutaneously 3 (three) times daily with meals (Patient taking differently: Inject 5 Units subcutaneously 3 (three) times daily with meals 5 units each meal) 10 mL 11   magnesium oxide (MAG-OX) 400 mg (241.3 mg magnesium) tablet TAKE 1 TABLET(400 MG) BY MOUTH TWICE DAILY 60 tablet 11   mycophenolate (MYFORTIC) 180 MG DR tablet Take 2 tablets (360 mg total) by mouth every 12 (twelve) hours 120 tablet 1   pantoprazole (PROTONIX) 40 MG DR tablet Take 1 tablet (40 mg total) by mouth 2 (two) times daily 60 tablet 11   pen needle, diabetic 32 gauge x 5/32" Ndle Use 1 each nightly --E11.9 30 each 11   predniSONE (DELTASONE) 5 MG tablet Take 1 tablet (5 mg total) by mouth once daily. 30 tablet 11   rosuvastatin (CRESTOR) 5 MG tablet Take 1 tablet (5 mg total) by mouth nightly 30 tablet 11   No current facility-administered medications on file prior to visit.   Family History  Problem Relation Age of Onset   ESRD-Dialysis Mother   Diabetes type II Mother   Kidney disease Mother   Kidney disease Father   Diabetes type II Father   Coronary Artery Disease (Blocked arteries around heart) Father   ESRD-Dialysis Father   Diabetes type II Brother   Lupus Sister   Diabetes type II Sister   Hepatitis B Brother    Social History   Tobacco Use  Smoking Status Never Smoker  Smokeless Tobacco Never Used    Social  History   Socioeconomic History   Marital status: Married  Occupational History   Occupation: Disabled  Tobacco Use   Smoking status: Never Smoker   Smokeless tobacco: Never Used  Scientific laboratory technician Use: Never used  Substance and Sexual Activity   Alcohol use: Yes   Drug use: No   Sexual activity: Not Currently  Comment: hystorectomy  Social History Narrative  Lives with two daughters; and one grand child   Objective:   There were no vitals filed for this visit.  There is no height or weight on file to calculate BMI.  Physical Exam Constitutional:  Appearance: Normal appearance.  HENT:  Head: Normocephalic.  Nose: Nose normal.  Mouth/Throat:  Mouth: Mucous membranes are moist.  Eyes:  Pupils: Pupils are equal, round, and reactive to light.  Cardiovascular:  Rate and Rhythm: Normal rate  and regular rhythm.  Pulmonary:  Effort: Pulmonary effort is normal.  Breath sounds: No stridor.  Chest:  Breasts:  Right: Normal.   Abdominal:  General: Abdomen is flat.  Musculoskeletal:  General: Normal range of motion.  Arms:  Cervical back: Normal range of motion.  Lymphadenopathy:  Upper Body:  Right upper body: No supraclavicular or axillary adenopathy.  Left upper body: No supraclavicular or axillary adenopathy.  Skin: General: Skin is warm and dry.  Neurological:  General: No focal deficit present.  Mental Status: She is alert.  Cranial Nerves: Cranial nerve deficit:   Psychiatric:  Mood and Affect: Mood normal.     Labs, Imaging and Diagnostic Testing: ADDITIONAL INFORMATION: FLUORESCENCE IN-SITU HYBRIDIZATION Results: GROUP 5: HER2 **NEGATIVE** Equivocal form of amplification of the HER2 gene was detected in the IHC 2+ tissue sample received from this individual. HER2 FISH was performed by a technologist and cell imaging and analysis on the [employer]. RATIO OF HER2/CEN17 SIGNALS 1.30 AVERAGE HER2 COPY NUMBER PER CELL 1.75 The ratio of HER2/CEN 17 is  within the range < 2.0 of HER2/CEN 17 and a copy number of HER2 signals per cell is <4.0. Arch Pathol Lab Med 1:1,2018 Thressa Sheller MD Pathologist, Electronic Signature ( Signed 02/15/2021) PROGNOSTIC INDICATORS Results: IMMUNOHISTOCHEMICAL AND MORPHOMETRIC ANALYSIS PERFORMED MANUALLY The tumor cells are EQUIVOCAL for Her2 (2+). Her2 by FISH will be performed and results performed separately. Estrogen Receptor: >95%, POSITIVE, STRONG STAINING INTENSITY Progesterone Receptor: 90%, POSITIVE, STRONG STAINING INTENSITY Proliferation Marker Ki67: 20% REFERENCE RANGE ESTROGEN RECEPTOR NEGATIVE 0% POSITIVE =>1% REFERENCE RANGE PROGESTERONE RECEPTOR 1 of 3 FINAL for MAKALYN, LENNOX [id number]) ADDITIONAL INFORMATION:(continued) NEGATIVE 0% POSITIVE =>1% All controls stained appropriately Thressa Sheller MD Pathologist, Electronic Signature ( Signed 02/14/2021) FINAL DIAGNOSIS Diagnosis Breast, left, needle core biopsy, 6:30 o'clock 8cm fn - INVASIVE DUCTAL CARCINOMA WITH HISTIOCYTOID FEATURES. SEE NOTE Diagnosis Note Carcinoma measures 1 cm in greatest linear dimension and appears grade 2. Dr. Saralyn Pilar reviewed the case and concurs with the diagnosis. A breast prognostic profile (ER, PR, Ki-67 and HER2) is pending and will be reported in an addendum. Dr. Truman Hayward at [facility] was notified on 02/10/2021. Jaquita Folds MD Pathologist, Electronic Signature (Case signed 02/10/2021)  Assessment and Plan:  Diagnoses and all orders for this visit:  Malignant neoplasm of lower-inner quadrant of left breast in female, estrogen receptor positive (CMS-HCC)    Seen today in the [place]. She has a Optometrist present today. Discussed breast conserving surgery, mastectomy Reconstruction lymph node surgery and the pro and cons of each. Long term survival and recurrence discussed. Complications of bleeding, infection, seroma, lymphedema, pain, cosmesis and expected recovery discussed. Other risk  odf death, DVT, cardiovascular event much less likely. Use of seed and tracers discussed.  SHE HAS CHOSEN SEED LOCALIZED LEFT BREAST LUMPECTOMY WITH SLN MAPPING.  She does have AV fistula left arm. Lymphedema risk is higher than baseline but necessary to stage her cancer.  PT evaluating preop. No follow-ups on file.    BRCA 2 positive- pt aware and wishes to proceed with lumpectomy and be followed with  MRI for now      Kennieth Francois, MD   I spent a total of minutes in both face-to-face and non-face-to-face activities for this visit on the date of this encounter.

## 2021-03-02 NOTE — Anesthesia Procedure Notes (Signed)
Procedure Name: LMA Insertion Date/Time: 03/02/2021 8:21 AM Performed by: Tawni Millers, CRNA Pre-anesthesia Checklist: Patient identified, Emergency Drugs available, Suction available and Patient being monitored Patient Re-evaluated:Patient Re-evaluated prior to induction Oxygen Delivery Method: Circle system utilized Preoxygenation: Pre-oxygenation with 100% oxygen Induction Type: IV induction Ventilation: Mask ventilation without difficulty LMA: LMA inserted LMA Size: 4.0 Number of attempts: 1 Airway Equipment and Method: Bite block Placement Confirmation: positive ETCO2 Tube secured with: Tape Dental Injury: Teeth and Oropharynx as per pre-operative assessment

## 2021-03-02 NOTE — Progress Notes (Signed)
Assisted Dr. Valma Cava with left, ultrasound guided, pectoralis block. Side rails up, monitors on throughout procedure. See vital signs in flow sheet. Tolerated Procedure well.

## 2021-03-02 NOTE — Op Note (Signed)
Preoperative diagnosis: Stage I left breast cancer lower inner quadrant  Postoperative diagnosis: Same  Procedure: Left breast seed localized lumpectomy with left axillary sentinel lymph node mapping utilizing Lymphoseek and mag trace  Surgeon: Erroll Luna, MD  Anesthesia: LMA with pectoral block and 0.25% Marcaine plain local  EBL: 90 cc  Drains: None  Specimen: Left breast tissue with seed and clip verified by Faxitron plus additional superior margin.  These were oriented with ink.  3 left axillary sentinel nodes  Indications for procedure: The patient is a pleasant 50 year old female diagnosed with stage I left breast cancer and evaluated at the multidisciplinary clinic.  Of note she underwent genetic testing and came back BRCA2 positive.  This was discussed with her but she was to proceed with breast conserving surgery and opted for long-term follow-up with yearly MRI understanding she may require future surgery.The procedure has been discussed with the patient. Alternatives to surgery have been discussed with the patient.  Risks of surgery include bleeding,  Infection,  Seroma formation, death,  and the need for further surgery.   The patient understands and wishes to proceed. Sentinel lymph node mapping and dissection has been discussed with the patient.  Risk of bleeding,  Infection,  Seroma formation,  Additional procedures,,  Shoulder weakness ,  Shoulder stiffness,  Nerve and blood vessel injury and reaction to the mapping dyes have been discussed.  Alternatives to surgery have been discussed with the patient.  The patient agrees to proceed.  Description of procedure: The patient was met in the holding area and questions were answered.  Neoprobe used identify seed left breast lower inner quadrant.  She underwent injection of Lymphoseek by radiology and had a pectoral block placed by anesthesiology.  All questions were answered with the help of an interpreter.  She was then taken back  to the operative room.  She was placed supine upon the OR table.  After induction of general esthesia, left breast was prepped sterilely and 2 cc of mag trace were injected in a subareolar position and massaged for 5 minutes.  She then had her left breast prepped and draped in a sterile fashion and timeout performed second time.  Proper patient, site and procedure were verified.  The lumpectomy was performed first.  Neoprobe used to identify seed location left breast lower inner quadrant.  Incision made along the inframammary fold and dissection carried to excise the tumor on the lower portion of her breast.  We took an additional margin after the Faxitron image revealed the seed and clip to the specimen but the superior margin appeared grossly close to me.  This was taken oriented.  The remainder of the margins were clear.  The deep margin was the pectoralis muscle and the anterior margin of the skin.  Cavity was irrigated.  Made hemostatic with cautery.  Clips were placed to mark the cavity for radiation therapy.  Was then closed with a deep layer 3-0 Vicryl and 4 Monocryl.  Both the neoprobe and the sent to [facility] device were used to locate the sentinel node.  Hotspot identified in left axilla.  A 4 cm incision was made along the inferior hairline left axilla.  Dissection was carried down into the level 1 contents.  There were 3 hot nodes detected by both tracers.  These were removed.  Background counts approached baseline.  The long thoracic nerve, thoracodorsal trunk and axillary vein were all preserved.  She had some oozing controlled with cautery and Arista.  Hemostasis  was excellent at that point.  We then closed the wound with a deep layer 3-0 Vicryl and 4 Monocryl.  Dermabond applied.  All counts found to be correct.  Breast binder placed.  She was taken to the recovery after extubation in satisfactory condition.

## 2021-03-02 NOTE — Transfer of Care (Signed)
Immediate Anesthesia Transfer of Care Note  Patient: Cynthia [name]  Procedure(s) Performed: LEFT BREAST SEED LUMPECTOMY LEFT SENTINEL LYMPH NODE MAPPING (Left: Breast)  Patient Location: PACU  Anesthesia Type:GA combined with regional for post-op pain  Level of Consciousness: awake  Airway & Oxygen Therapy: Patient Spontanous Breathing and Patient connected to face mask oxygen  Post-op Assessment: Report given to RN and Post -op Vital signs reviewed and stable  Post vital signs: Reviewed and stable  Last Vitals:  Vitals Value Taken Time  BP 133/67 03/02/21 1004  Temp    Pulse 61 03/02/21 1007  Resp 11 03/02/21 1007  SpO2 93 % 03/02/21 1007  Vitals shown include unvalidated device data.  Last Pain:  Vitals:   03/02/21 0706  TempSrc: Oral  PainSc: 0-No pain         Complications: No notable events documented.

## 2021-03-02 NOTE — Discharge Instructions (Addendum)
[facility] Office Phone Number [phone]  BREAST BIOPSY/ PARTIAL MASTECTOMY: POST OP INSTRUCTIONS  Always review your discharge instruction sheet given to you by the facility where your surgery was performed.  IF YOU HAVE DISABILITY OR FAMILY LEAVE FORMS, YOU MUST BRING THEM TO THE OFFICE FOR PROCESSING.  DO NOT GIVE THEM TO YOUR DOCTOR.  A prescription for pain medication may be given to you upon discharge.  Take your pain medication as prescribed, if needed.  If narcotic pain medicine is not needed, then you may take acetaminophen (Tylenol) or ibuprofen (Advil) as needed. Take your usually prescribed medications unless otherwise directed If you need a refill on your pain medication, please contact your pharmacy.  They will contact our office to request authorization.  Prescriptions will not be filled after 5pm or on week-ends. You should eat very light the first 24 hours after surgery, such as soup, crackers, pudding, etc.  Resume your normal diet the day after surgery. Most patients will experience some swelling and bruising in the breast.  Ice packs and a good support bra will help.  Swelling and bruising can take several days to resolve.  It is common to experience some constipation if taking pain medication after surgery.  Increasing fluid intake and taking a stool softener will usually help or prevent this problem from occurring.  A mild laxative (Milk of Magnesia or Miralax) should be taken according to package directions if there are no bowel movements after 48 hours. Unless discharge instructions indicate otherwise, you may remove your bandages 24-48 hours after surgery, and you may shower at that time.  You may have steri-strips (small skin tapes) in place directly over the incision.  These strips should be left on the skin for 7-10 days.  If your surgeon used skin glue on the incision, you may shower in 24 hours.  The glue will flake off over the next 2-3 weeks.  Any  sutures or staples will be removed at the office during your follow-up visit. ACTIVITIES:  You may resume regular daily activities (gradually increasing) beginning the next day.  Wearing a good support bra or sports bra minimizes pain and swelling.  You may have sexual intercourse when it is comfortable. You may drive when you no longer are taking prescription pain medication, you can comfortably wear a seatbelt, and you can safely maneuver your car and apply brakes. RETURN TO WORK:  ______________________________________________________________________________________ You should see your doctor in the office for a follow-up appointment approximately two weeks after your surgery.  Your doctor's nurse will typically make your follow-up appointment when she calls you with your pathology report.  Expect your pathology report 2-3 business days after your surgery.  You may call to check if you do not hear from us after three days. OTHER INSTRUCTIONS: _______________________________________________________________________________________________ _____________________________________________________________________________________________________________________________________ _____________________________________________________________________________________________________________________________________ _____________________________________________________________________________________________________________________________________  WHEN TO CALL YOUR DOCTOR: Fever over 101.0 Nausea and/or vomiting. Extreme swelling or bruising. Continued bleeding from incision. Increased pain, redness, or drainage from the incision.  The clinic staff is available to answer your questions during regular business hours.  Please don't hesitate to call and ask to speak to one of the nurses for clinical concerns.  If you have a medical emergency, go to the nearest emergency room or call 911.  A surgeon from [facility] is always on call at the hospital.  For further questions, please visit [email or web address]    Post Anesthesia Home Care Instructions  Activity: Get plenty of rest for the remainder of   remainder of the day. A responsible individual must stay with you for 24 hours following the procedure.  For the next 24 hours, DO NOT: -Drive a car -Paediatric nurse -Drink alcoholic beverages -Take any medication unless instructed by your physician -Make any legal decisions or sign important papers.  Meals: Start with liquid foods such as gelatin or soup. Progress to regular foods as tolerated. Avoid greasy, spicy, heavy foods. If nausea and/or vomiting occur, drink only clear liquids until the nausea and/or vomiting subsides. Call your physician if vomiting continues.  Special Instructions/Symptoms: Your throat may feel dry or sore from the anesthesia or the breathing tube placed in your throat during surgery. If this causes discomfort, gargle with warm salt water. The discomfort should disappear within 24 hours.  If you had a scopolamine patch placed behind your ear for the management of post- operative nausea and/or vomiting:  1. The medication in the patch is effective for 72 hours, after which it should be removed.  Wrap patch in a tissue and discard in the trash. Wash hands thoroughly with soap and water. 2. You may remove the patch earlier than 72 hours if you experience unpleasant side effects which may include dry mouth, dizziness or visual disturbances. 3. Avoid touching the patch. Wash your hands with soap and water after contact with the patch.  Regional Anesthesia Blocks  1. Numbness or the inability to move the "blocked" extremity may last from 3-48 hours after placement. The length of time depends on the medication injected and your individual response to the medication. If the numbness is not going away after 48 hours, call your surgeon.  2. The extremity that is blocked  will need to be protected until the numbness is gone and the  Strength has returned. Because you cannot feel it, you will need to take extra care to avoid injury. Because it may be weak, you may have difficulty moving it or using it. You may not know what position it is in without looking at it while the block is in effect.  3. For blocks in the legs and feet, returning to weight bearing and walking needs to be done carefully. You will need to wait until the numbness is entirely gone and the strength has returned. You should be able to move your leg and foot normally before you try and bear weight or walk. You will need someone to be with you when you first try to ensure you do not fall and possibly risk injury.  4. Bruising and tenderness at the needle site are common side effects and will resolve in a few days.  5. Persistent numbness or new problems with movement should be communicated to the surgeon or the [facility] [phone] [place] [phone]).

## 2021-03-02 NOTE — Anesthesia Procedure Notes (Addendum)
Anesthesia Regional Block: Pectoralis block   Pre-Anesthetic Checklist: , timeout performed,  Correct Patient, Correct Site, Correct Laterality,  Correct Procedure, Correct Position, site marked,  Risks and benefits discussed,  Surgical consent,  Pre-op evaluation,  At surgeon's request and post-op pain management  Laterality: Left  Prep: chloraprep       Needles:  Injection technique: Single-shot  Needle Type: Echogenic Needle     Needle Length: 9cm  Needle Gauge: 21     Additional Needles:   Procedures:,,,, ultrasound used (permanent image in chart),,    Narrative:  Start time: 03/02/2021 7:19 AM End time: 03/02/2021 7:27 AM Injection made incrementally with aspirations every 5 mL.  Performed by: Personally  Anesthesiologist: Barnet Glasgow, MD  Additional Notes: Block assessed. Patient tolerated procedure well.

## 2021-03-02 NOTE — Anesthesia Postprocedure Evaluation (Signed)
Anesthesia Post Note  Patient: Cynthia [name]  Procedure(s) Performed: LEFT BREAST SEED LUMPECTOMY LEFT SENTINEL LYMPH NODE MAPPING (Left: Breast)     Patient location during evaluation: PACU Anesthesia Type: General Level of consciousness: awake and alert Pain management: pain level controlled Vital Signs Assessment: post-procedure vital signs reviewed and stable Respiratory status: spontaneous breathing, nonlabored ventilation, respiratory function stable and patient connected to nasal cannula oxygen Cardiovascular status: blood pressure returned to baseline and stable Postop Assessment: no apparent nausea or vomiting Anesthetic complications: no   No notable events documented.  Last Vitals:  Vitals:   03/02/21 1045 03/02/21 1130  BP: (!) 171/72 (!) 158/66  Pulse: 63 62  Resp: 12 14  Temp:  36.6 C  SpO2: 94% 96%    Last Pain:  Vitals:   03/02/21 1130  TempSrc:   PainSc: 4                  [place]

## 2021-03-02 NOTE — Interval H&P Note (Signed)
History and Physical Interval Note:  03/02/2021 7:17 AM  Cynthia [name]  has presented today for surgery, with the diagnosis of LEFT BREAST CANCER.  The various methods of treatment have been discussed with the patient and family. After consideration of risks, benefits and other options for treatment, the patient has consented to  Procedure(s) with comments: LEFT BREAST SEED LUMPECTOMY LEFT SENTINEL LYMPH NODE MAPPING (Left) - GEN AND PEC BLOCK as a surgical intervention.  The patient's history has been reviewed, patient examined, no change in status, stable for surgery.  I have reviewed the patient's chart and labs.  Questions were answered to the patient's satisfaction.     [place]

## 2021-03-03 ENCOUNTER — Encounter (HOSPITAL_BASED_OUTPATIENT_CLINIC_OR_DEPARTMENT_OTHER): Payer: Self-pay | Admitting: Surgery

## 2021-03-03 LAB — SURGICAL PATHOLOGY

## 2021-03-04 ENCOUNTER — Encounter: Payer: Self-pay | Admitting: Surgery

## 2021-03-07 ENCOUNTER — Encounter: Payer: Self-pay | Admitting: *Deleted

## 2021-03-07 ENCOUNTER — Encounter (HOSPITAL_BASED_OUTPATIENT_CLINIC_OR_DEPARTMENT_OTHER): Payer: Self-pay | Admitting: Cardiology

## 2021-03-07 ENCOUNTER — Telehealth: Payer: Self-pay | Admitting: *Deleted

## 2021-03-07 DIAGNOSIS — E785 Hyperlipidemia, unspecified: Secondary | ICD-10-CM | POA: Insufficient documentation

## 2021-03-07 DIAGNOSIS — I700 Atherosclerosis of aorta: Secondary | ICD-10-CM | POA: Insufficient documentation

## 2021-03-07 DIAGNOSIS — E7800 Pure hypercholesterolemia, unspecified: Secondary | ICD-10-CM | POA: Insufficient documentation

## 2021-03-07 NOTE — Telephone Encounter (Signed)
Received order for oncotype testing. Requisition faxed to pathology and [facility] °

## 2021-03-16 ENCOUNTER — Telehealth: Payer: Self-pay | Admitting: *Deleted

## 2021-03-16 ENCOUNTER — Encounter (HOSPITAL_COMMUNITY): Payer: Self-pay

## 2021-03-16 NOTE — Telephone Encounter (Signed)
Received oncotype results of 26/16%.  Team notified.

## 2021-03-20 ENCOUNTER — Telehealth: Payer: Self-pay | Admitting: Genetic Counselor

## 2021-03-20 ENCOUNTER — Ambulatory Visit: Payer: Self-pay | Admitting: Genetic Counselor

## 2021-03-20 ENCOUNTER — Telehealth: Payer: Self-pay | Admitting: *Deleted

## 2021-03-20 ENCOUNTER — Encounter: Payer: Self-pay | Admitting: Hematology

## 2021-03-20 DIAGNOSIS — Z1501 Genetic susceptibility to malignant neoplasm of breast: Secondary | ICD-10-CM

## 2021-03-20 DIAGNOSIS — Z1509 Genetic susceptibility to other malignant neoplasm: Secondary | ICD-10-CM

## 2021-03-20 NOTE — Telephone Encounter (Signed)
I attempted to contact Ms. Salvador 3 times today to review her final genetic test results. I was also calling to offer an in-person genetic counseling consultation tomorrow after her appointment with Dr. Burr Medico. However, I was unable to reach her and left three voicemails.  Lucille Passy, MS, [facility] Genetic Counselor [email or web address] (P) [phone]

## 2021-03-20 NOTE — Telephone Encounter (Signed)
Called patient with interpreter services and left voicemail with appt information.    I called and spoke with her daughter Lamar Benes and gave her appt inf for 9/27 at 9am to see Dr. Burr Medico.  She states she will let her mom know and she will be there.

## 2021-03-20 NOTE — Progress Notes (Signed)
HPI:   Cynthia [name] was previously seen in the [facility] clinic due to a personal and family history of cancer and concerns regarding a hereditary predisposition to cancer. Please refer to our prior cancer genetics clinic note for more information regarding our discussion, assessment and recommendations, at the time. Cynthia [name] recent genetic test results were disclosed to her, as were recommendations warranted by these results. These results and recommendations are discussed in more detail below.  CANCER HISTORY:  Oncology History Overview Note  Cancer Staging Malignant neoplasm of lower-inner quadrant of left breast in female, estrogen receptor positive ([place]) Staging form: Breast, AJCC 8th Edition - Clinical stage from 02/09/2021: Stage IA (cT1c, cN0, cM0, G2, ER+, PR+, HER2-) - Signed by Truitt Merle, MD on 02/20/2021 Stage prefix: Initial diagnosis Histologic grading system: 3 grade system    Malignant neoplasm of lower-inner quadrant of left breast in female, estrogen receptor positive ([place])  02/07/2021 Mammogram   Exam: Left Diagnostic Mammogram; Left Breast Ultrasound  IMPRESSION: Irregular mass in left breast at 6:30, measuring 1.6 cm by mammogram, is highly suggestive of malignancy. Left axillary ultrasound is negative for lymphadenopathy.   02/09/2021 Cancer Staging   Staging form: Breast, AJCC 8th Edition - Clinical stage from 02/09/2021: Stage IA (cT1c, cN0, cM0, G2, ER+, PR+, HER2-) - Signed by Truitt Merle, MD on 02/20/2021 Stage prefix: Initial diagnosis Histologic grading system: 3 grade system   02/09/2021 Pathology Results   Diagnosis Breast, left, needle core biopsy, 6:30 o'clock 8cm fn - INVASIVE DUCTAL CARCINOMA WITH HISTIOCYTOID FEATURES. SEE NOTE Diagnosis Note Carcinoma measures 1 cm in greatest linear dimension and appears grade 2.  PROGNOSTIC INDICATORS Results: IMMUNOHISTOCHEMICAL AND MORPHOMETRIC ANALYSIS PERFORMED MANUALLY The tumor cells are  EQUIVOCAL for Her2 (2+). Her2 by FISH will be performed and results performed separately. Estrogen Receptor: >95%, POSITIVE, STRONG STAINING INTENSITY Progesterone Receptor: 90%, POSITIVE, STRONG STAINING INTENSITY Proliferation Marker Ki67: 20%  FLUORESCENCE IN-SITU HYBRIDIZATION Results: GROUP 5: HER2 **NEGATIVE** Equivocal form of amplification of the HER2 gene was detected in the IHC 2+ tissue sample received from this individual. HER2 FISH was performed by a technologist and cell imaging and analysis on the [employer]. RATIO OF HER2/CEN17 SIGNALS 1.30 AVERAGE HER2 COPY NUMBER PER CELL 1.75   02/15/2021 Initial Diagnosis   Malignant neoplasm of lower-inner quadrant of left breast in female, estrogen receptor positive ([place])   03/01/2021 Genetic Testing   Positive genetic testing: pathogenic variant detected in BRCA2 at [id number].  No other pathogenic variants detected in [facility] CustomNext Panel.  The report date is 03/19/2021.  The CustomNext-Cancer+RNAinsight panel offered by [facility] includes sequencing and rearrangement analysis for the following 47 genes:  APC, ATM, AXIN2, BARD1, BMPR1A, BRCA1, BRCA2, BRIP1, CDH1, CDK4, CDKN2A, CHEK2, DICER1, EPCAM, GREM1, HOXB13, MEN1, MLH1, MSH2, MSH3, MSH6, MUTYH, NBN, NF1, NF2, NTHL1, PALB2, PMS2, POLD1, POLE, PTEN, RAD51C, RAD51D, RECQL, RET, SDHA, SDHAF2, SDHB, SDHC, SDHD, SMAD4, SMARCA4, STK11, TP53, TSC1, TSC2, and VHL.  RNA data is routinely analyzed for use in variant interpretation for all genes.     FAMILY HISTORY:  We obtained a detailed, 4-generation family history.  Significant diagnoses are listed below: Family History  Problem Relation Age of Onset   Diabetes Mother    Kidney disease Mother    Diabetes Father    Heart disease Father    Kidney disease Father    Hypertension Father    Diabetes Sister    Asthma Sister    Lupus Sister    Diabetes  Brother    Diabetes Brother    Diabetes Brother    Diabetes Brother     Cancer Maternal Grandmother 56       ovarian cancer   Diabetes Maternal Grandfather    Diabetes Paternal Grandmother    Colon cancer Neg Hx    Colon polyps Neg Hx    Esophageal cancer Neg Hx    Rectal cancer Neg Hx    Stomach cancer Neg Hx          GENETIC TEST RESULTS:  The CustomNext-Cancer+RNAinsight panel found a single pathogenic variant (harmful genetic change) in the BRCA2 gene. Specifically, this variant is [id number]. Therefore, this is a positive result.   The CustomNext-Cancer+RNAinsight panel offered by [facility] includes sequencing and rearrangement analysis for the following 47 genes:  APC, ATM, AXIN2, BARD1, BMPR1A, BRCA1, BRCA2, BRIP1, CDH1, CDK4, CDKN2A, CHEK2, DICER1, EPCAM, GREM1, HOXB13, MEN1, MLH1, MSH2, MSH3, MSH6, MUTYH, NBN, NF1, NF2, NTHL1, PALB2, PMS2, POLD1, POLE, PTEN, RAD51C, RAD51D, RECQL, RET, SDHA, SDHAF2, SDHB, SDHC, SDHD, SMAD4, SMARCA4, STK11, TP53, TSC1, TSC2, and VHL.  RNA data is routinely analyzed for use in variant interpretation for all genes.   The test report has been scanned into EPIC and is located under the Molecular Pathology section of the Results Review tab.  A portion of the result report is included below for reference. Genetic testing reported out on 03/19/2021.        Clinical Information: Hereditary breast and ovarian cancer (HBOC) syndrome is characterized by an increased lifetime risk for, generally, adult-onset cancers including, breast, contralateral breast, female breast, ovarian, prostate, melanoma and pancreatic.  The cancers associated with BRCA2 are: Female breast cancer, up to an 84% risk In women with a history of breast cancer, the cumulative risk for contralateral breast cancer 5 years after breast cancer diagnosis is 9%. The cumulative 20 year risk is approximately 26%. Female breast cancer, up to an 8% risk Ovarian cancer, 17-27% risk Pancreatic cancer, up to a 7% risk Prostate cancer, up to a 34%  risk Melanoma, elevated risk   Management Recommendations:  Breast Screening/Risk Reduction:  Women: Breast cancer screening includes: Breast awareness beginning at age 51 Monthly self-breast examination beginning at age 26 Clinical breast examination every 6-12 months beginning at age 23 or at the age of the earliest diagnosed breast cancer in the family, if onset was before age 53 Annual breast MRI with contrast beginning at age 12-29 (or annual mammograms with consideration of tomosynthesis if MRI is unavailable), although the age to initiate screening may be individualized based on family history Annual mammogram beginning at age 48 until age 74 with consideration of tomosynthesis with continuation of annual breast MRI with contrast The option of prophylactic bilateral risk-reducing mastectomy (RRM), removal of the breast tissue before cancer develops, is the best option for significantly decreasing the risk of developing breast cancer. Studies have shown mastectomies reduce the risk of breast cancer by 90-95% in women with a BRCA2 mutation. Breast reconstruction can also be performed immediately following the mastectomy, depending on the type of reconstruction chosen. For women with a BRCA2 pathogenic or likely pathogenic variant who are treated for breast cancer and have not had a bilateral mastectomy, screening with annual mammogram with consideration of tomosynthesis and breast MRI should continue as described above.  Males: Breast self-exam training and education starting at age 66 years Annual female chest exams by a general physician starting at the age of 42, in addition to monthly self-chest exams.  Gynecological Cancer Screening/Risk Reduction: It is recommended that women with a BRCA2 mutation consider having a risk-reducing salpingo oophorectomy (RRSO), removal of the ovaries and fallopian tubes, between the ages of 71-45 or once childbearing is completed. Having a RRSO is  estimated to reduce the risk of ovarian cancer by up to 96%. There is still a small risk of developing an "ovarian-like" cancer in the lining of the abdomen, called the peritoneum. Another benefit to having the ovaries removed is the risk reduction for breast cancer. If the ovaries are removed before menopause, the risk of developing breast cancer is reduced. Women undergoing a RRSO should be aware of the potential risks and benefits of concurrent hysterectomy. Hormone replacement therapy could be considered based on the physician's discretion. Ovarian cancer screening is an option for women who chose not to have a RRSO or who, as of yet, have not completed their family. Current screening methods for ovarian cancer are neither sensitive nor specific, meaning that often early stage ovarian cancer cannot be diagnosed through this screening.  Screening can also be falsely positive with no cancer present. For this reason, ovarian cancer screening is not recommended for women beyond their child-bearing years. If ovarian cancer screening is recommended by your physician, it could include: CA-125 blood tests Transvaginal ultrasounds Clinical pelvic exams   Skin Cancer Screening and Risk Reduction: Regular skin self-examinations Individuals should notify their physicians of any changes to moles such as increasing in size, darkening in color, or other change in appearance. Annual skin examinations by a dermatologist  Follow sun-safety recommendations such as: Using UVA and UVB 30 SPF or higher sunscreen Avoiding sunburns Limiting sun exposure, especially during the hours of 11am-4pm  Wearing protective clothing and sunglasses Avoid using tanning beds For more information about the prevention of melanoma visit [email or web address]   Prostate Cancer Screening: Annual digital rectal exam (DRE) at age 21 Annual PSA blood test at age 20  Pancreatic Cancer Screening/Risk Reduction: Avoid smoking, heavy  alcohol use, and obesity. It has been suggested that pancreatic cancer screening be limited to those with a family history of pancreatic cancer (first- or second-degree relative). Ideally, screening should be performed in experienced centers utilizing a multidisciplinary approach under research conditions. Recommended screening could include annual endoscopic ultrasound (preferred) and/or MRI of the pancreas starting at age 62 or 62 years younger than the earliest age diagnosis in the family. CA19-9 testing may be considered based on the physician's discretion.  Additional Considerations: Individuals at risk for developing breast and ovarian cancer may benefit from the use of medication to reduce their risk for cancer. These medications are referred to as chemoprevention. For example, oral contraceptive use has been shown to reduce the risk of ovarian cancer by approximately 60% in BRCA2 mutation carriers if taken for at least 5 years. This risk reduction remains even after discontinuation of oral contraceptives. Recent studies have suggested PARP inhibitors may be a beneficial chemotherapeutic agent for a subset of patients with BRCA2-associated breast, ovarian, prostate, and pancreatic cancers. Clinical trials are currently in process to determine if and how these agents can be useful in the treatment of BRCA2 cancer patients Patients of reproductive age should be made aware of options for prenatal diagnosis and assisted reproduction including pre-implantation genetic diagnosis. Individuals with a single pathogenic BRCA2 variant are also carriers of Fanconi anemia. Fanconi anemia is characterized by bone marrow failure with variable additional anomalies, which often include short stature, abnormal skin pigmentation, abnormal thumbs, malformations of the skeletal and  central nervous systems, and developmental delay. Risk of leukemia and early onset solid tumors is significantly elevated with this disorder.  For there to be a risk of Fanconi anemia in offspring, both the patient and their partner would each have to carry a pathogenic variant in BRCA2; in this case, the risk to have an affected child is 25%.   This information is based on current understanding of the gene and may change in the future.   Implications for Family Members: Hereditary predisposition to cancer due to pathogenic variants in the BRCA2 gene has autosomal dominant inheritance. This means that an individual with a pathogenic variant has a 50% chance of passing the condition on to his/her offspring. Identification of a pathogenic variant allows for the recognition of at-risk relatives who can pursue testing for the familial variant.  Family members are encouraged to consider genetic testing for this familial pathogenic variant. As there are generally no childhood cancer risks associated with pathogenic variants in the BRCA2 gene, individuals in the family are not recommended to have testing until they reach at least 52 years of age. They may contact our office at [phone] for more information or to schedule an appointment.  Complimentary testing for the familial variant is available for 90 days from her report date.  Family members who live outside of the area are encouraged to find a genetic counselor in their area by visiting: [email or web address].  Resources: FORCE (Facing Our Risk of Cancer Empowered) is a resource for those with a hereditary predisposition to develop cancer.  FORCE provides information about risk reduction, advocacy, legislation, and clinical trials.  Additionally, [employer]CE provides a platform for collaboration and support; which includes: peer navigation, message boards, local support groups, a toll-free helpline, research registry and recruitment, advocate training, published medical research, webinars, brochures, mastectomy photos, and more.  For more information, visit  [email or web address] (P) [phone]

## 2021-03-21 ENCOUNTER — Encounter: Payer: Self-pay | Admitting: *Deleted

## 2021-03-21 ENCOUNTER — Other Ambulatory Visit: Payer: Self-pay | Admitting: Hematology

## 2021-03-21 ENCOUNTER — Encounter: Payer: Self-pay | Admitting: Hematology

## 2021-03-21 ENCOUNTER — Other Ambulatory Visit: Payer: Self-pay

## 2021-03-21 ENCOUNTER — Inpatient Hospital Stay: Payer: Medicare HMO | Attending: Hematology | Admitting: Hematology

## 2021-03-21 VITALS — BP 181/81 | HR 63 | Temp 98.0°F | Resp 18 | Ht 60.0 in | Wt 208.7 lb

## 2021-03-21 DIAGNOSIS — E1122 Type 2 diabetes mellitus with diabetic chronic kidney disease: Secondary | ICD-10-CM | POA: Diagnosis not present

## 2021-03-21 DIAGNOSIS — E1140 Type 2 diabetes mellitus with diabetic neuropathy, unspecified: Secondary | ICD-10-CM | POA: Diagnosis not present

## 2021-03-21 DIAGNOSIS — N186 End stage renal disease: Secondary | ICD-10-CM | POA: Insufficient documentation

## 2021-03-21 DIAGNOSIS — Z992 Dependence on renal dialysis: Secondary | ICD-10-CM | POA: Insufficient documentation

## 2021-03-21 DIAGNOSIS — I120 Hypertensive chronic kidney disease with stage 5 chronic kidney disease or end stage renal disease: Secondary | ICD-10-CM | POA: Diagnosis not present

## 2021-03-21 DIAGNOSIS — C50312 Malignant neoplasm of lower-inner quadrant of left female breast: Secondary | ICD-10-CM | POA: Diagnosis present

## 2021-03-21 DIAGNOSIS — Z794 Long term (current) use of insulin: Secondary | ICD-10-CM | POA: Insufficient documentation

## 2021-03-21 DIAGNOSIS — Z8616 Personal history of COVID-19: Secondary | ICD-10-CM | POA: Diagnosis not present

## 2021-03-21 DIAGNOSIS — Z170 Estrogen receptor positive status [ER+]: Secondary | ICD-10-CM

## 2021-03-21 NOTE — Progress Notes (Addendum)
[place]   [phone] [phone]   Clinic Follow up Note   Patient Care Team: Arthur Holms, NP as PCP - General (Nurse Practitioner) Buford Dresser, MD as PCP - Cardiology (Cardiology) Rexene Agent, MD as Consulting Physician (Nephrology) Dwana Melena, MD as Referring Physician (Nephrology) Rockwell Germany, RN as Oncology Nurse Navigator Mauro Kaufmann, RN as Oncology Nurse Navigator Truitt Merle, MD as Consulting Physician (Hematology) Erroll Luna, MD as Consulting Physician (General Surgery) Eppie Gibson, MD as Attending Physician (Radiation Oncology) 03/21/2021  CHIEF COMPLAINT: discuss Oncotype result   ASSESSMENT & PLAN: 50 yo female  Cynthia [name] is a 50 y.o.  female with a history of DM, HTN, history of ESRD s/p kidney transplant   1. 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 She has high risk based on the recurrence score 26 which predicts 9 year distant recurrence after 5 years of tamoxifen 16%. She would benefit from adjuvant chemo, although her benefit would be less than others with higher RS  -I discussed the adjuvant chemotherapy option.  Given her node-negative disease, and the relatively low recurrence score in the high risk group, I recommend docetaxel and cyclophosphamide every 3 weeks for 4 cycles. --Chemotherapy consent: Side effects including but does not not limited to, fatigue, nausea, vomiting, diarrhea, hair loss, neuropathy, fluid retention, renal and kidney dysfunction, neutropenic fever, needed for blood transfusion, bleeding, were discussed with patient in great detail.  Due to her diabetes and on immunosuppression for kidney transplant, her risk of infection is much higher.  She agrees to proceed. -The goal of therapy is curative. -I  encouraged her to get the second COVID booster -I also discussed the role of [place] to prevent COVID, plan to give it before she starts chemo (at least 2 weeks after the booster) -Her breast surgical incision is still open, she is scheduled to see Dr. Brantley Stage today.  I anticipate it would take 2 to 4 weeks to heal.  Plan to start adjuvant chemo after her wound heals. -Due to her wound healing issue, I do not plan to place a port for her chemo.    2. Comorbidities: DM with neuropathy, HTN, s/p kidney transplant -continue medications and f/u -she has diabetic neuropathy at baseline, on gabapentin   3. Covid-19+ 05/2019     PLAN:  -I reviewed her Oncotype Dx test results, and recommend adjuvant chemotherapy TC for 4 cycles, she is interested in [place], I gave her the info  -She will follow-up with Dr. Deri Fuelling for her wound issue, I sent a message to Dr. Brantley Stage, he will inform me when her wound is healed so I arrange her chemo   SUMMARY OF ONCOLOGIC HISTORY: Oncology History Overview Note  Cancer Staging Malignant neoplasm of lower-inner quadrant of left breast in female, estrogen receptor positive ([place]) Staging form: Breast, AJCC 8th Edition - Clinical stage from 02/09/2021: Stage IA (cT1c, cN0, cM0, G2, ER+, PR+, HER2-) - Signed by Truitt Merle, MD on 02/20/2021 Stage prefix: Initial diagnosis Histologic grading system: 3 grade system - Pathologic stage from 03/02/2021: Stage IA (pT2, pN0, cM0, G2, ER+, PR+, HER2-, Oncotype DX score: 26) - Signed by Truitt Merle, MD on 03/21/2021 Stage prefix: Initial diagnosis Multigene prognostic tests performed: Oncotype DX Recurrence score range: Greater than or equal to 11 Histologic grading system:  3 grade system Residual tumor (R): R0 - None    Malignant neoplasm of lower-inner quadrant of left breast in female, estrogen receptor positive ([place])  02/07/2021 Mammogram   Exam: Left Diagnostic Mammogram; Left Breast Ultrasound  IMPRESSION: Irregular  mass in left breast at 6:30, measuring 1.6 cm by mammogram, is highly suggestive of malignancy. Left axillary ultrasound is negative for lymphadenopathy.   02/09/2021 Cancer Staging   Staging form: Breast, AJCC 8th Edition - Clinical stage from 02/09/2021: Stage IA (cT1c, cN0, cM0, G2, ER+, PR+, HER2-) - Signed by Truitt Merle, MD on 02/20/2021 Stage prefix: Initial diagnosis Histologic grading system: 3 grade system   02/09/2021 Pathology Results   Diagnosis Breast, left, needle core biopsy, 6:30 o'clock 8cm fn - INVASIVE DUCTAL CARCINOMA WITH HISTIOCYTOID FEATURES. SEE NOTE Diagnosis Note Carcinoma measures 1 cm in greatest linear dimension and appears grade 2.  PROGNOSTIC INDICATORS Results: IMMUNOHISTOCHEMICAL AND MORPHOMETRIC ANALYSIS PERFORMED MANUALLY The tumor cells are EQUIVOCAL for Her2 (2+). Her2 by FISH will be performed and results performed separately. Estrogen Receptor: >95%, POSITIVE, STRONG STAINING INTENSITY Progesterone Receptor: 90%, POSITIVE, STRONG STAINING INTENSITY Proliferation Marker Ki67: 20%  FLUORESCENCE IN-SITU HYBRIDIZATION Results: GROUP 5: HER2 **NEGATIVE** Equivocal form of amplification of the HER2 gene was detected in the IHC 2+ tissue sample received from this individual. HER2 FISH was performed by a technologist and cell imaging and analysis on the [employer]. RATIO OF HER2/CEN17 SIGNALS 1.30 AVERAGE HER2 COPY NUMBER PER CELL 1.75   02/15/2021 Initial Diagnosis   Malignant neoplasm of lower-inner quadrant of left breast in female, estrogen receptor positive ([place])   03/01/2021 Genetic Testing   Positive genetic testing: pathogenic variant detected in BRCA2 at [id number].  No other pathogenic variants detected in [facility] Panel.  The report date is 03/19/2021.  The CustomNext-Cancer+RNAinsight panel offered by [facility] includes sequencing and rearrangement analysis for the following 47 genes:  APC, ATM, AXIN2, BARD1, BMPR1A, BRCA1, BRCA2,  BRIP1, CDH1, CDK4, CDKN2A, CHEK2, DICER1, EPCAM, GREM1, HOXB13, MEN1, MLH1, MSH2, MSH3, MSH6, MUTYH, NBN, NF1, NF2, NTHL1, PALB2, PMS2, POLD1, POLE, PTEN, RAD51C, RAD51D, RECQL, RET, SDHA, SDHAF2, SDHB, SDHC, SDHD, SMAD4, SMARCA4, STK11, TP53, TSC1, TSC2, and VHL.  RNA data is routinely analyzed for use in variant interpretation for all genes.   03/02/2021 Cancer Staging   Staging form: Breast, AJCC 8th Edition - Pathologic stage from 03/02/2021: Stage IA (pT2, pN0, cM0, G2, ER+, PR+, HER2-, Oncotype DX score: 26) - Signed by Truitt Merle, MD on 03/21/2021 Stage prefix: Initial diagnosis Multigene prognostic tests performed: Oncotype DX Recurrence score range: Greater than or equal to 11 Histologic grading system: 3 grade system Residual tumor (R): R0 - None     CURRENT THERAPY: pending adjuvant chemo  INTERVAL HISTORY: Cynthia [name] returns for follow-up and to review Oncotype test result.  She underwent left breast lumpectomy and sentinel lymph node biopsy on March 02, 2021.  Unfortunately her surgical incision is still open, she is currently on antibiotics, and is scheduled to see Dr. Brantley Stage later today.  Pain is tolerable, she takes Tylenol as needed.  No breast or arm edema.  No other complaints.  REVIEW OF SYSTEMS:   Constitutional: Denies fevers, chills or abnormal weight loss Eyes: Denies blurriness of vision Ears, nose, mouth, throat, and face: Denies mucositis or sore throat Respiratory: Denies cough, dyspnea or wheezes Cardiovascular: Denies palpitation, chest discomfort or lower extremity swelling Gastrointestinal:  Denies nausea, heartburn or change in bowel habits Skin: Denies abnormal skin rashes Lymphatics: Denies new  lymphadenopathy or easy bruising Neurological:Denies numbness, tingling or new weaknesses Behavioral/Psych: Mood is stable, no new changes  All other systems were reviewed with the patient and are negative.  MEDICAL HISTORY:  Past Medical History:  Diagnosis Date    Allergy    pollen   Anemia    when on dialysis   Anxiety    BRCA2 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 03/01/2021   Breast cancer [facility])    Cataract    surgical repair bilateral   Depression    ESRD on hemodialysis ([place])    Home HD 5x per week- not on dialysis now had tramsplant 4/17   Family history of ovarian cancer 02/22/2021   GERD (gastroesophageal reflux disease)    Hearing loss 2017   right ear   History of blood transfusion    transfusion reaction   Hyperlipidemia    Hypertension    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 per pt-dr levy also II   Kidney transplant recipient    Neuromuscular disorder ([place])    NEUROPATHY   Sleep apnea     SURGICAL HISTORY: Past Surgical History:  Procedure Laterality Date   ABDOMINAL HYSTERECTOMY     BREAST EXCISIONAL BIOPSY Right 08/2015   BREAST LUMPECTOMY WITH RADIOACTIVE SEED LOCALIZATION Right 09/14/2015   Procedure: RIGHT BREAST LUMPECTOMY WITH RADIOACTIVE SEED LOCALIZATION;  Surgeon: Donnie Mesa, MD;  Location: [place];  Service: General;  Laterality: Right;   BREAST LUMPECTOMY WITH RADIOACTIVE SEED LOCALIZATION Left 03/02/2021   Procedure: LEFT BREAST SEED LUMPECTOMY LEFT SENTINEL LYMPH NODE [place];  Surgeon: Erroll Luna, MD;  Location: [place];  Service: General;  Laterality: Left;  GEN AND PEC BLOCK   BREAST SURGERY Bilateral    biopsy bilateral   CATARACT EXTRACTION Bilateral    bilateral   CHOLECYSTECTOMY     CYST REMOVAL NECK     DIALYSIS FISTULA CREATION Left    EYE SURGERY Bilateral    lazer   LEFT HEART CATH AND CORONARY ANGIOGRAPHY N/A 03/04/2019   Procedure: LEFT HEART CATH AND CORONARY ANGIOGRAPHY;  Surgeon: Martinique, Peter M, MD;  Location: [place] [facility];  Service: Cardiovascular;  Laterality: N/A;   LEFT HEART CATHETERIZATION WITH CORONARY ANGIOGRAM N/A 03/30/2014   Procedure: LEFT HEART CATHETERIZATION WITH CORONARY  ANGIOGRAM;  Surgeon: Sinclair Grooms, MD;  Location[facility] CATH LAB;  Service: Cardiovascular;  Laterality: N/A;   LIPOMA EXCISION N/A 02/06/2017   Procedure: EXCISION POSTERIOR NECK SEBACEOUS CYST;  Surgeon: Coralie Keens, MD;  Location: [place];  Service: General;  Laterality: N/A;   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   Procedure: REPAIR OF ARTERIOVENOUS FISTULA ANEURYSM;  Surgeon: Serafina Mitchell, MD;  Location: [facility];  Service: Vascular;  Laterality: Left;   REVISON OF ARTERIOVENOUS FISTULA Left 09/28/2013   Procedure: EXCISE ESCHAR LEFT ARM  ARTERIOVENOUS FISTULA WITH PLICATION OF LEFT ARM ARTERIOVENOUS FISTULA;  Surgeon: Elam Dutch, MD;  Location: [place];  Service: Vascular;  Laterality: Left;   REVISON OF ARTERIOVENOUS FISTULA Left 08/26/2015   Procedure: RESECTION ANEURYSM OF LEFT ARM ARTERIOVENOUS FISTULA  ;  Surgeon: Serafina Mitchell, MD;  Location: [place];  Service: Vascular;  Laterality: Left;   TUBAL LIGATION      I have reviewed the social history and family history with the patient and they are unchanged from previous note.  ALLERGIES:  is allergic to other, norvasc [amlodipine], and tape.  MEDICATIONS:  Current Outpatient Medications  Medication Sig Dispense Refill   Accu-Chek Softclix Lancets lancets Use as instructed 100 each 12   aspirin EC 81 MG EC tablet Take 1 tablet (81 mg total) by mouth daily. (Patient taking differently: Take 81 mg by mouth at bedtime.) 90 tablet 3   B Complex-C (SUPER B COMPLEX PO) Take 1 tablet by mouth daily.      Blood Glucose Monitoring Suppl (ACCU-CHEK AVIVA CONNECT) w/Device KIT Check sugar 1x daily 1 kit 0   carvedilol (COREG) 25 MG tablet Take 12.5 mg by mouth 2 (two) times daily with a meal.      cetirizine (ZYRTEC) 10 MG tablet Take 5 mg by mouth daily. Takes 0.5 tablet daily     Cholecalciferol (VITAMIN D3) 1000 units CAPS Take 1,000 Units by mouth daily.      fludrocortisone (FLORINEF) 0.1 MG tablet Take 0.1 mg  by mouth daily.     FLUoxetine (PROZAC) 40 MG capsule Take 40 mg by mouth daily.     furosemide (LASIX) 40 MG tablet Take 40 mg by mouth 2 (two) times daily.     gabapentin (NEURONTIN) 300 MG capsule Take 300 mg by mouth at bedtime.  12   glucose blood (ACCU-CHEK AVIVA PLUS) test strip Check sugar 1x daily 100 each 12   ibuprofen (ADVIL) 800 MG tablet Take 1 tablet (800 mg total) by mouth every 8 (eight) hours as needed. 30 tablet 0   insulin glargine (LANTUS SOLOSTAR) 100 UNIT/ML Solostar Pen Inject 65 Units into the skin every morning. 75 mL PRN   Insulin Pen Needle (PEN NEEDLES) 30G X 8 MM MISC 1 each by Does not apply route daily. E11.9 90 each 0   magnesium oxide (MAG-OX) 400 MG tablet Take 400 mg by mouth 2 (two) times daily.      mycophenolate (MYFORTIC) 180 MG EC tablet Take 360 mg by mouth 2 (two) times daily.     oxyCODONE (OXY IR/ROXICODONE) 5 MG immediate release tablet Take 1 tablet (5 mg total) by mouth every 6 (six) hours as needed for severe pain. 15 tablet 0   pantoprazole (PROTONIX) 40 MG tablet Take 40 mg by mouth daily.  5   predniSONE (DELTASONE) 5 MG tablet Take 5 mg by mouth daily.   2   rosuvastatin (CRESTOR) 5 MG tablet Take 5 mg by mouth at bedtime.     Tacrolimus ER (ENVARSUS XR) 1 MG TB24 Take 3 mg by mouth every morning.     vitamin E 400 UNIT capsule Take 400 Units by mouth daily.     No current facility-administered medications for this visit.    PHYSICAL EXAMINATION: ECOG PERFORMANCE STATUS: 1 - Symptomatic but completely ambulatory  Vitals:   03/21/21 0930  BP: (!) 181/81  Pulse: 63  Resp: 18  Temp: 98 F (36.7 C)  SpO2: 97%   Filed Weights   03/21/21 0930  Weight: 208 lb 11.2 oz (94.7 kg)    GENERAL:alert, no distress and comfortable SKIN: skin color, texture, turgor are normal, no rashes or significant lesions EYES: normal, Conjunctiva are pink and non-injected, sclera clear NECK: supple, thyroid normal size, non-tender, without  nodularity LYMPH:  no palpable lymphadenopathy in the cervical, axillary or inguinal LUNGS: clear to auscultation and percussion with normal breathing effort HEART: regular rate & rhythm and no murmurs and no lower extremity edema ABDOMEN:abdomen soft, non-tender and normal bowel sounds Musculoskeletal:no cyanosis of digits and no clubbing  NEURO: alert & oriented x  3 with fluent speech, no focal motor/sensory deficits BREAST: The incision in the upper outer quadrant of left breast is still open, covered.   LABORATORY DATA:  I have reviewed the data as listed CBC Latest Ref Rng & Units 02/28/2021 02/22/2021 06/06/2019  WBC 4.0 - 10.5 K/uL 8.0 6.0 3.9(L)  Hemoglobin 12.0 - 15.0 g/dL 13.9 13.7 13.1  Hematocrit 36.0 - 46.0 % 43.0 40.0 38.8  Platelets 150 - 400 K/uL 175 168 190     CMP Latest Ref Rng & Units 02/28/2021 02/22/2021 06/06/2019  Glucose 70 - 99 mg/dL 42(LL) 67(L) 95  BUN 6 - 20 mg/dL 25(H) 23(H) 22(H)  Creatinine 0.44 - 1.00 mg/dL 1.58(H) 1.27(H) 0.90  Sodium 135 - 145 mmol/L 139 141 137  Potassium 3.5 - 5.1 mmol/L 4.4 4.2 4.2  Chloride 98 - 111 mmol/L 104 108 104  CO2 22 - 32 mmol/L _0 Calcium 8.9 - 10.3 mg/dL 10.4(H) 10.6(H) 9.8  Total Protein 6.5 - 8.1 g/dL 6.6 6.8 6.0(L)  Total Bilirubin 0.3 - 1.2 mg/dL 0.4 0.4 0.4  Alkaline Phos 38 - 126 U/L 77 92 68  AST 15 - 41 U/L 26 32 25  ALT 0 - 44 U/L 47(H) 58(H) 36      RADIOGRAPHIC STUDIES: I have personally reviewed the radiological images as listed and agreed with the findings in the report. No results found.     No orders of the defined types were placed in this encounter.  All questions were answered. The patient knows to call the clinic with any problems, questions or concerns. No barriers to learning was detected. I spent 330 minutes counseling the patient face to face. The total time spent in the appointment was 40 minutes and more than 50% was on counseling and review of test results     Truitt Merle,  MD 03/21/21

## 2021-03-21 NOTE — Progress Notes (Signed)
START ON PATHWAY REGIMEN - Breast ? ? ?  A cycle is every 21 days: ?    Docetaxel  ?    Cyclophosphamide  ? ?**Always confirm dose/schedule in your pharmacy ordering system** ? ?Patient Characteristics: ?Postoperative without Neoadjuvant Therapy (Pathologic Staging), Invasive Disease, Adjuvant Therapy, HER2 Negative/Unknown/Equivocal, ER Positive, Node Negative, pT1a-c, pN0/N1mi or pT2 or Higher, pN0, Oncotype High Risk (? 26) ?Therapeutic Status: Postoperative without Neoadjuvant Therapy (Pathologic Staging) ?AJCC Grade: G2 ?AJCC N Category: pN0 ?AJCC M Category: cM0 ?ER Status: Positive (+) ?AJCC 8 Stage Grouping: IA ?HER2 Status: Negative (-) ?Oncotype Dx Recurrence Score: 26 ?AJCC T Category: pT2 ?PR Status: Positive (+) ?Adjuvant Therapy Status: No Adjuvant Therapy Received Yet or Changing Initial Adjuvant Regimen due to Tolerance ?Has this patient completed genomic testing<= Yes - Oncotype DX(R) ?Intent of Therapy: ?Curative Intent, Discussed with Patient ?

## 2021-03-22 ENCOUNTER — Telehealth: Payer: Self-pay | Admitting: Hematology

## 2021-03-22 NOTE — Telephone Encounter (Signed)
Scheduled per sch msg. Called with interpreter, left msg

## 2021-03-27 ENCOUNTER — Encounter: Payer: Self-pay | Admitting: *Deleted

## 2021-03-27 ENCOUNTER — Encounter: Payer: Self-pay | Admitting: Hematology

## 2021-03-27 ENCOUNTER — Inpatient Hospital Stay: Payer: Medicare HMO | Attending: Hematology

## 2021-03-27 ENCOUNTER — Other Ambulatory Visit: Payer: Self-pay

## 2021-03-29 ENCOUNTER — Ambulatory Visit: Payer: Self-pay | Admitting: Genetic Counselor

## 2021-03-29 DIAGNOSIS — Z1501 Genetic susceptibility to malignant neoplasm of breast: Secondary | ICD-10-CM

## 2021-03-29 NOTE — Progress Notes (Signed)
HPI:   Cynthia [name] was previously seen in the [place] clinic due to a personal and family history of cancer and concerns regarding a hereditary predisposition to cancer. Please refer to our prior cancer genetics clinic note for more information regarding our discussion, assessment and recommendations, at the time. Cynthia [name] recent genetic test results were disclosed to her, as were recommendations warranted by these results. These results and recommendations are discussed in more detail below.  CANCER HISTORY:  Oncology History Overview Note  Cancer Staging Malignant neoplasm of lower-inner quadrant of left breast in female, estrogen receptor positive ([place]) Staging form: Breast, AJCC 8th Edition - Clinical stage from 02/09/2021: Stage IA (cT1c, cN0, cM0, G2, ER+, PR+, HER2-) - Signed by Truitt Merle, MD on 02/20/2021 Stage prefix: Initial diagnosis Histologic grading system: 3 grade system - Pathologic stage from 03/02/2021: Stage IA (pT2, pN0, cM0, G2, ER+, PR+, HER2-, Oncotype DX score: 26) - Signed by Truitt Merle, MD on 03/21/2021 Stage prefix: Initial diagnosis Multigene prognostic tests performed: Oncotype DX Recurrence score range: Greater than or equal to 11 Histologic grading system: 3 grade system Residual tumor (R): R0 - None    Malignant neoplasm of lower-inner quadrant of left breast in female, estrogen receptor positive ([place])  02/07/2021 Mammogram   Exam: Left Diagnostic Mammogram; Left Breast Ultrasound  IMPRESSION: Irregular mass in left breast at 6:30, measuring 1.6 cm by mammogram, is highly suggestive of malignancy. Left axillary ultrasound is negative for lymphadenopathy.   02/09/2021 Cancer Staging   Staging form: Breast, AJCC 8th Edition - Clinical stage from 02/09/2021: Stage IA (cT1c, cN0, cM0, G2, ER+, PR+, HER2-) - Signed by Truitt Merle, MD on 02/20/2021 Stage prefix: Initial diagnosis Histologic grading system: 3 grade system   02/09/2021 Pathology Results    Diagnosis Breast, left, needle core biopsy, 6:30 o'clock 8cm fn - INVASIVE DUCTAL CARCINOMA WITH HISTIOCYTOID FEATURES. SEE NOTE Diagnosis Note Carcinoma measures 1 cm in greatest linear dimension and appears grade 2.  PROGNOSTIC INDICATORS Results: IMMUNOHISTOCHEMICAL AND MORPHOMETRIC ANALYSIS PERFORMED MANUALLY The tumor cells are EQUIVOCAL for Her2 (2+). Her2 by FISH will be performed and results performed separately. Estrogen Receptor: >95%, POSITIVE, STRONG STAINING INTENSITY Progesterone Receptor: 90%, POSITIVE, STRONG STAINING INTENSITY Proliferation Marker Ki67: 20%  FLUORESCENCE IN-SITU HYBRIDIZATION Results: GROUP 5: HER2 **NEGATIVE** Equivocal form of amplification of the HER2 gene was detected in the IHC 2+ tissue sample received from this individual. HER2 FISH was performed by a technologist and cell imaging and analysis on the [employer]. RATIO OF HER2/CEN17 SIGNALS 1.30 AVERAGE HER2 COPY NUMBER PER CELL 1.75   02/15/2021 Initial Diagnosis   Malignant neoplasm of lower-inner quadrant of left breast in female, estrogen receptor positive ([place])   03/01/2021 Genetic Testing   Positive genetic testing: pathogenic variant detected in BRCA2 at [id number].  No other pathogenic variants detected in [facility] Panel.  The report date is 03/19/2021.  The CustomNext-Cancer+RNAinsight panel offered by [facility] includes sequencing and rearrangement analysis for the following 47 genes:  APC, ATM, AXIN2, BARD1, BMPR1A, BRCA1, BRCA2, BRIP1, CDH1, CDK4, CDKN2A, CHEK2, DICER1, EPCAM, GREM1, HOXB13, MEN1, MLH1, MSH2, MSH3, MSH6, MUTYH, NBN, NF1, NF2, NTHL1, PALB2, PMS2, POLD1, POLE, PTEN, RAD51C, RAD51D, RECQL, RET, SDHA, SDHAF2, SDHB, SDHC, SDHD, SMAD4, SMARCA4, STK11, TP53, TSC1, TSC2, and VHL.  RNA data is routinely analyzed for use in variant interpretation for all genes.   03/02/2021 Cancer Staging   Staging form: Breast, AJCC 8th Edition - Pathologic stage from 03/02/2021:  Stage IA (pT2,  pN0, cM0, G2, ER+, PR+, HER2-, Oncotype DX score: 26) - Signed by Truitt Merle, MD on 03/21/2021 Stage prefix: Initial diagnosis Multigene prognostic tests performed: Oncotype DX Recurrence score range: Greater than or equal to 11 Histologic grading system: 3 grade system Residual tumor (R): R0 - None   04/12/2021 -  Chemotherapy   Patient is on Treatment Plan : BREAST TC q21d       FAMILY HISTORY:  We obtained a detailed, 4-generation family history.  Significant diagnoses are listed below: Family History  Problem Relation Age of Onset   Diabetes Mother    Kidney disease Mother    Diabetes Father    Heart disease Father    Kidney disease Father    Hypertension Father    Diabetes Sister    Asthma Sister    Lupus Sister    Diabetes Brother    Diabetes Brother    Diabetes Brother    Diabetes Brother    Cancer Maternal Grandmother 1       ovarian cancer   Diabetes Maternal Grandfather    Diabetes Paternal Grandmother    Colon cancer Neg Hx    Colon polyps Neg Hx    Esophageal cancer Neg Hx    Rectal cancer Neg Hx    Stomach cancer Neg Hx              GENETIC TEST RESULTS:  Ms. Siegrist tested positive for a single pathogenic variant (harmful genetic change) in the BRCA2 gene. Specifically, this variant is [id number].  The test report has been scanned into EPIC and is located under the Molecular Pathology section of the Results Review tab.  A portion of the result report is included below for reference. Genetic testing reported out on 03/19/2021. She has a genetic counseling appointment 04/04/2021 for an in-person results discussion.          Clinical Information: Hereditary breast and ovarian cancer (HBOC) syndrome is characterized by an increased lifetime risk for, generally, adult-onset cancers including, breast, contralateral breast, female breast, ovarian, prostate, melanoma and pancreatic.  The cancers associated with BRCA2 are: Female breast  cancer, up to an 84% risk In women with a history of breast cancer, the cumulative risk for contralateral breast cancer 5 years after breast cancer diagnosis is 9%. The cumulative 20 year risk is approximately 26%. Female breast cancer, up to an 8% risk Ovarian cancer, 17-27% risk Pancreatic cancer, up to a 7% risk Prostate cancer, up to a 34% risk Melanoma, elevated risk   Management Recommendations:  Breast Screening/Risk Reduction:  Women: Breast cancer screening includes: Breast awareness beginning at age 38 Monthly self-breast examination beginning at age 42 Clinical breast examination every 6-12 months beginning at age 81 or at the age of the earliest diagnosed breast cancer in the family, if onset was before age 11 Annual breast MRI with contrast beginning at age 24-29 (or annual mammograms with consideration of tomosynthesis if MRI is unavailable), although the age to initiate screening may be individualized based on family history Annual mammogram beginning at age 46 until age 52 with consideration of tomosynthesis with continuation of annual breast MRI with contrast The option of prophylactic bilateral risk-reducing mastectomy (RRM), removal of the breast tissue before cancer develops, is the best option for significantly decreasing the risk of developing breast cancer. Studies have shown mastectomies reduce the risk of breast cancer by 90-95% in women with a BRCA2 mutation. Breast reconstruction can also be performed immediately following the mastectomy, depending on the  type of reconstruction chosen. For women with a BRCA2 pathogenic or likely pathogenic variant who are treated for breast cancer and have not had a bilateral mastectomy, screening with annual mammogram with consideration of tomosynthesis and breast MRI should continue as described above.  Males: Breast self-exam training and education starting at age 58 years Annual female chest exams by a general physician starting at  the age of 88, in addition to monthly self-chest exams.   Gynecological Cancer Screening/Risk Reduction: It is recommended that women with a BRCA2 mutation consider having a risk-reducing salpingo oophorectomy (RRSO), removal of the ovaries and fallopian tubes, between the ages of 38-45 or once childbearing is completed. Having a RRSO is estimated to reduce the risk of ovarian cancer by up to 96%. There is still a small risk of developing an "ovarian-like" cancer in the lining of the abdomen, called the peritoneum. Another benefit to having the ovaries removed is the risk reduction for breast cancer. If the ovaries are removed before menopause, the risk of developing breast cancer is reduced. Women undergoing a RRSO should be aware of the potential risks and benefits of concurrent hysterectomy. Hormone replacement therapy could be considered based on the physician's discretion. Ovarian cancer screening is an option for women who chose not to have a RRSO or who, as of yet, have not completed their family. Current screening methods for ovarian cancer are neither sensitive nor specific, meaning that often early stage ovarian cancer cannot be diagnosed through this screening.  Screening can also be falsely positive with no cancer present. For this reason, ovarian cancer screening is not recommended for women beyond their child-bearing years. If ovarian cancer screening is recommended by your physician, it could include: CA-125 blood tests Transvaginal ultrasounds Clinical pelvic exams   Skin Cancer Screening and Risk Reduction: Regular skin self-examinations Individuals should notify their physicians of any changes to moles such as increasing in size, darkening in color, or other change in appearance. Annual skin examinations by a dermatologist  Follow sun-safety recommendations such as: Using UVA and UVB 30 SPF or higher sunscreen Avoiding sunburns Limiting sun exposure, especially during the hours of  11am-4pm  Wearing protective clothing and sunglasses Avoid using tanning beds For more information about the prevention of melanoma visit [email or web address]   Prostate Cancer Screening: Annual digital rectal exam (DRE) at age 41 Annual PSA blood test at age 72  Pancreatic Cancer Screening/Risk Reduction: Avoid smoking, heavy alcohol use, and obesity. It has been suggested that pancreatic cancer screening be limited to those with a family history of pancreatic cancer (first- or second-degree relative). Ideally, screening should be performed in experienced centers utilizing a multidisciplinary approach under research conditions. Recommended screening could include annual endoscopic ultrasound (preferred) and/or MRI of the pancreas starting at age 5 or 80 years younger than the earliest age diagnosis in the family. CA19-9 testing may be considered based on the physician's discretion.  Additional Considerations: Individuals at risk for developing breast and ovarian cancer may benefit from the use of medication to reduce their risk for cancer. These medications are referred to as chemoprevention. For example, oral contraceptive use has been shown to reduce the risk of ovarian cancer by approximately 60% in BRCA2 mutation carriers if taken for at least 5 years. This risk reduction remains even after discontinuation of oral contraceptives. Recent studies have suggested PARP inhibitors may be a beneficial chemotherapeutic agent for a subset of patients with BRCA2-associated breast, ovarian, prostate, and pancreatic cancers. Clinical trials are currently in process  to determine if and how these agents can be useful in the treatment of BRCA2 cancer patients Patients of reproductive age should be made aware of options for prenatal diagnosis and assisted reproduction including pre-implantation genetic diagnosis. Individuals with a single pathogenic BRCA2 variant are also carriers of Fanconi anemia. Fanconi  anemia is characterized by bone marrow failure with variable additional anomalies, which often include short stature, abnormal skin pigmentation, abnormal thumbs, malformations of the skeletal and central nervous systems, and developmental delay. Risk of leukemia and early onset solid tumors is significantly elevated with this disorder. For there to be a risk of Fanconi anemia in offspring, both the patient and their partner would each have to carry a pathogenic variant in BRCA2; in this case, the risk to have an affected child is 25%.   This information is based on current understanding of the gene and may change in the future.   Implications for Family Members: Hereditary predisposition to cancer due to pathogenic variants in the BRCA2 gene has autosomal dominant inheritance. This means that an individual with a pathogenic variant has a 50% chance of passing the condition on to his/her offspring. Identification of a pathogenic variant allows for the recognition of at-risk relatives who can pursue testing for the familial variant.  Family members are encouraged to consider genetic testing for this familial pathogenic variant. As there are generally no childhood cancer risks associated with pathogenic variants in the BRCA2 gene, individuals in the family are not recommended to have testing until they reach at least 52 years of age. They may contact our office at [phone] for more information or to schedule an appointment.  Complimentary testing for the familial variant is available for 90 days.  Family members who live outside of the area are encouraged to find a genetic counselor in their area by visiting: [email or web address].  Resources: FORCE (Facing Our Risk of Cancer Empowered) is a resource for those with a hereditary predisposition to develop cancer.  FORCE provides information about risk reduction, advocacy, legislation, and clinical trials.  Additionally, FORCE  provides a platform for collaboration and support; which includes: peer navigation, message boards, local support groups, a toll-free helpline, research registry and recruitment, advocate training, published medical research, webinars, brochures, mastectomy photos, and more.  For more information, visit www.facingourrisk.org  Our contact number was provided. Ms. Caywood questions were answered to her satisfaction, and she knows she is welcome to call us at anytime with additional questions or concerns.   Lucille Passy, MS, [facility] Genetic Counselor [email or web address] (P) [phone]

## 2021-04-03 ENCOUNTER — Encounter: Payer: Self-pay | Admitting: *Deleted

## 2021-04-04 ENCOUNTER — Other Ambulatory Visit: Payer: Self-pay

## 2021-04-04 ENCOUNTER — Inpatient Hospital Stay (HOSPITAL_BASED_OUTPATIENT_CLINIC_OR_DEPARTMENT_OTHER): Payer: Medicare HMO | Admitting: Genetic Counselor

## 2021-04-04 DIAGNOSIS — Z1502 Genetic susceptibility to malignant neoplasm of ovary: Secondary | ICD-10-CM | POA: Diagnosis not present

## 2021-04-04 DIAGNOSIS — Z170 Estrogen receptor positive status [ER+]: Secondary | ICD-10-CM

## 2021-04-04 DIAGNOSIS — C50312 Malignant neoplasm of lower-inner quadrant of left female breast: Secondary | ICD-10-CM

## 2021-04-04 DIAGNOSIS — Z1509 Genetic susceptibility to other malignant neoplasm: Secondary | ICD-10-CM

## 2021-04-04 DIAGNOSIS — Z1501 Genetic susceptibility to malignant neoplasm of breast: Secondary | ICD-10-CM

## 2021-04-05 ENCOUNTER — Encounter: Payer: Self-pay | Admitting: Hematology

## 2021-04-05 NOTE — Progress Notes (Addendum)
REFERRING PROVIDER: Truitt Merle, MD [address], [place] [id number]  PRIMARY PROVIDER:  Arthur Holms, NP  PRIMARY REASON FOR VISIT:  1. BRCA2 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     HISTORY OF PRESENT ILLNESS:   Cynthia [name], a 50 y.o. female, was seen for a [place] follow-up cancer genetics consultation. Cynthia [name] was found to have a BRCA2 gene mutation. Therefore, we met in-person again to discuss her results in more detail.   During the results discussion, we reviewed her genetic test results and management recommendations as noted below.     GENETIC TEST RESULTS:  Cynthia [name] tested positive for a single pathogenic variant (harmful genetic change) in the BRCA2 gene. Specifically, this variant is [id number].             Clinical Information: Hereditary breast and ovarian cancer (HBOC) syndrome is characterized by an increased lifetime risk for, generally, adult-onset cancers including, breast, contralateral breast, female breast, ovarian, prostate, melanoma and pancreatic.  The cancers associated with BRCA2 are: Female breast cancer, up to an 84% risk In women with a history of breast cancer, the cumulative risk for contralateral breast cancer 5 years after breast cancer diagnosis is 9%. The cumulative 20 year risk is approximately 26%. Female breast cancer, up to an 8% risk Ovarian cancer, 17-27% risk Pancreatic cancer, up to a 7% risk Prostate cancer, up to a 34% risk Melanoma, elevated risk   Management Recommendations:  Breast Screening/Risk Reduction:  Women: Breast cancer screening includes: Breast awareness beginning at age 13 Monthly self-breast examination beginning at age 17 Clinical breast examination every 6-12 months beginning at age 10 or at the age of the earliest diagnosed breast cancer in the family, if onset was before age 15 Annual breast MRI with contrast beginning at age 5-29 (or annual mammograms with consideration of tomosynthesis  if MRI is unavailable), although the age to initiate screening may be individualized based on family history Annual mammogram beginning at age 40 until age 20 with consideration of tomosynthesis with continuation of annual breast MRI with contrast The option of prophylactic bilateral risk-reducing mastectomy (RRM), removal of the breast tissue before cancer develops, is the best option for significantly decreasing the risk of developing breast cancer. Studies have shown mastectomies reduce the risk of breast cancer by 90-95% in women with a BRCA2 mutation. Breast reconstruction can also be performed immediately following the mastectomy, depending on the type of reconstruction chosen. For women with a BRCA2 pathogenic or likely pathogenic variant who are treated for breast cancer and have not had a bilateral mastectomy, screening with annual mammogram with consideration of tomosynthesis and breast MRI should continue as described above.  Males: Breast self-exam training and education starting at age 13 years Annual female chest exams by a general physician starting at the age of 17, in addition to monthly self-chest exams.   Gynecological Cancer Screening/Risk Reduction: It is recommended that women with a BRCA2 mutation consider having a risk-reducing salpingo oophorectomy (RRSO), removal of the ovaries and fallopian tubes, between the ages of 20-45 or once childbearing is completed. Having a RRSO is estimated to reduce the risk of ovarian cancer by up to 96%. There is still a small risk of developing an "ovarian-like" cancer in the lining of the abdomen, called the peritoneum. Another benefit to having the ovaries removed is the risk reduction for breast cancer. If the ovaries are removed before menopause, the risk of developing breast cancer is reduced. Women undergoing a RRSO should  be aware of the potential risks and benefits of concurrent hysterectomy. Hormone replacement therapy could be considered  based on the physician's discretion. Ovarian cancer screening is an option for women who chose not to have a RRSO or who, as of yet, have not completed their family. Current screening methods for ovarian cancer are neither sensitive nor specific, meaning that often early stage ovarian cancer cannot be diagnosed through this screening.  Screening can also be falsely positive with no cancer present. For this reason, ovarian cancer screening is not recommended for women beyond their child-bearing years. If ovarian cancer screening is recommended by your physician, it could include: CA-125 blood tests Transvaginal ultrasounds Clinical pelvic exams   Skin Cancer Screening and Risk Reduction: Regular skin self-examinations Individuals should notify their physicians of any changes to moles such as increasing in size, darkening in color, or other change in appearance. Annual skin examinations by a dermatologist  Follow sun-safety recommendations such as: Using UVA and UVB 30 SPF or higher sunscreen Avoiding sunburns Limiting sun exposure, especially during the hours of 11am-4pm  Wearing protective clothing and sunglasses Avoid using tanning beds For more information about the prevention of melanoma visit [email or web address]   Prostate Cancer Screening: Annual digital rectal exam (DRE) at age 39 Annual PSA blood test at age 11  Pancreatic Cancer Screening/Risk Reduction: Avoid smoking, heavy alcohol use, and obesity. It has been suggested that pancreatic cancer screening be limited to those with a family history of pancreatic cancer (first- or second-degree relative). Ideally, screening should be performed in experienced centers utilizing a multidisciplinary approach under research conditions. Recommended screening could include annual endoscopic ultrasound (preferred) and/or MRI of the pancreas starting at age 45 or 79 years younger than the earliest age diagnosis in the family. CA19-9 testing may  be considered based on the physician's discretion.  Additional Considerations: Individuals at risk for developing breast and ovarian cancer may benefit from the use of medication to reduce their risk for cancer. These medications are referred to as chemoprevention. For example, oral contraceptive use has been shown to reduce the risk of ovarian cancer by approximately 60% in BRCA2 mutation carriers if taken for at least 5 years. This risk reduction remains even after discontinuation of oral contraceptives. Recent studies have suggested PARP inhibitors may be a beneficial chemotherapeutic agent for a subset of patients with BRCA2-associated breast, ovarian, prostate, and pancreatic cancers. Clinical trials are currently in process to determine if and how these agents can be useful in the treatment of BRCA2 cancer patients Patients of reproductive age should be made aware of options for prenatal diagnosis and assisted reproduction including pre-implantation genetic diagnosis. Individuals with a single pathogenic BRCA2 variant are also carriers of Fanconi anemia. Fanconi anemia is characterized by bone marrow failure with variable additional anomalies, which often include short stature, abnormal skin pigmentation, abnormal thumbs, malformations of the skeletal and central nervous systems, and developmental delay. Risk of leukemia and early onset solid tumors is significantly elevated with this disorder. For there to be a risk of Fanconi anemia in offspring, both the patient and their partner would each have to carry a pathogenic variant in BRCA2; in this case, the risk to have an affected child is 25%.   This information is based on current understanding of the gene and may change in the future.   Implications for Family Members: Hereditary predisposition to cancer due to pathogenic variants in the BRCA2 gene has autosomal dominant inheritance. This means that an individual with a pathogenic  variant has a  50% chance of passing the condition on to his/her offspring. Identification of a pathogenic variant allows for the recognition of at-risk relatives who can pursue testing for the familial variant.  Family members are encouraged to consider genetic testing for this familial pathogenic variant. As there are generally no childhood cancer risks associated with pathogenic variants in the BRCA2 gene, individuals in the family are not recommended to have testing until they reach at least 52 years of age. They may contact our office at [phone] for more information or to schedule an appointment.  Complimentary testing for the familial variant is available for 90 days.  Family members who live outside of the area are encouraged to find a genetic counselor in their area by visiting: [email or web address].  Cynthia [name] questions were answered to her satisfaction today. Our contact information was provided should additional questions or concerns arise. Thank you for the referral and allowing [place] to share in the care of your patient.   Lucille Passy, MS, [facility] Genetic Counselor [email or web address] (P) [phone]  The patient was seen for a total of 25 minutes in face-to-face genetic counseling.  The patient brought her husband. Drs. Magrinat, Lindi Adie and/or Burr Medico were available to discuss this case as needed.   _______________________________________________________________________ For Office Staff:  Number of people involved in session: 3 (including the interpreter) Was an Intern/ student involved with case: no

## 2021-04-12 NOTE — Progress Notes (Signed)
Pharmacist Chemotherapy Monitoring - Initial Assessment    Anticipated start date: 04/19/21   The following has been reviewed per standard work regarding the patient's treatment regimen: The patient's diagnosis, treatment plan and drug doses, and organ/hematologic function Lab orders and baseline tests specific to treatment regimen  The treatment plan start date, drug sequencing, and pre-medications Prior authorization status  Patient's documented medication list, including drug-drug interaction screen and prescriptions for anti-emetics and supportive care specific to the treatment regimen The drug concentrations, fluid compatibility, administration routes, and timing of the medications to be used The patient's access for treatment and lifetime cumulative dose history, if applicable  The patient's medication allergies and previous infusion related reactions, if applicable   Changes made to treatment plan:  N/A  Follow up needed:  N/A   Larene Beach, RPH, 04/12/2021  2:02 PM

## 2021-04-14 ENCOUNTER — Telehealth: Payer: Self-pay | Admitting: *Deleted

## 2021-04-14 ENCOUNTER — Telehealth: Payer: Self-pay | Admitting: Hematology

## 2021-04-14 NOTE — Telephone Encounter (Signed)
Rescheduled upcoming appointment per 10/21 staff message. Patient is aware of changes.

## 2021-04-14 NOTE — Telephone Encounter (Signed)
Sent scheduling a message to postpone pt chemo, labs and office visit and schedule the week of November 7th.

## 2021-04-14 NOTE — Telephone Encounter (Signed)
-----  Message from Truitt Merle, MD sent at 04/14/2021 12:48 PM EDT ----- OK, thanks. No sure if you saw that she has BRCA2 mutation (+), please discuss double mastectomy with her in future.   Voris Tigert, please postpone her next week lab, f/u and chemo for 1-2 weeks  Thanks  Krista Blue ----- Message ----- From: Erroll Luna, MD Sent: 04/14/2021  10:57 AM EDT To: Truitt Merle, MD  Wound better  Seeing her next week  Should be able to start chemo in 2 - 3 weeks

## 2021-04-17 ENCOUNTER — Encounter: Payer: Self-pay | Admitting: *Deleted

## 2021-04-19 ENCOUNTER — Other Ambulatory Visit: Payer: Medicare HMO

## 2021-04-19 ENCOUNTER — Ambulatory Visit: Payer: Medicare HMO | Admitting: Nurse Practitioner

## 2021-04-19 ENCOUNTER — Ambulatory Visit: Payer: Medicare HMO

## 2021-04-24 ENCOUNTER — Encounter: Payer: Self-pay | Admitting: *Deleted

## 2021-04-25 NOTE — Progress Notes (Signed)
Pharmacist Chemotherapy Monitoring - Initial Assessment    Anticipated start date: 05/02/21   The following has been reviewed per standard work regarding the patient's treatment regimen: The patient's diagnosis, treatment plan and drug doses, and organ/hematologic function Lab orders and baseline tests specific to treatment regimen  The treatment plan start date, drug sequencing, and pre-medications Prior authorization status  Patient's documented medication list, including drug-drug interaction screen and prescriptions for anti-emetics and supportive care specific to the treatment regimen The drug concentrations, fluid compatibility, administration routes, and timing of the medications to be used The patient's access for treatment and lifetime cumulative dose history, if applicable  The patient's medication allergies and previous infusion related reactions, if applicable   Changes made to treatment plan:  N/A  Follow up needed:  N/A   Philomena Course, RPH, 04/25/2021  1:54 PM

## 2021-04-30 ENCOUNTER — Encounter: Payer: Self-pay | Admitting: Nurse Practitioner

## 2021-05-01 ENCOUNTER — Encounter: Payer: Self-pay | Admitting: Nurse Practitioner

## 2021-05-01 ENCOUNTER — Encounter: Payer: Self-pay | Admitting: Physical Therapy

## 2021-05-01 ENCOUNTER — Ambulatory Visit: Payer: Medicare HMO | Attending: Surgery | Admitting: Physical Therapy

## 2021-05-01 ENCOUNTER — Other Ambulatory Visit: Payer: Self-pay

## 2021-05-01 DIAGNOSIS — Z170 Estrogen receptor positive status [ER+]: Secondary | ICD-10-CM | POA: Insufficient documentation

## 2021-05-01 DIAGNOSIS — R293 Abnormal posture: Secondary | ICD-10-CM | POA: Diagnosis present

## 2021-05-01 DIAGNOSIS — C50312 Malignant neoplasm of lower-inner quadrant of left female breast: Secondary | ICD-10-CM | POA: Insufficient documentation

## 2021-05-01 DIAGNOSIS — Z483 Aftercare following surgery for neoplasm: Secondary | ICD-10-CM | POA: Diagnosis present

## 2021-05-01 NOTE — Patient Instructions (Addendum)
     [facility]  [address]  [place] [address]  [phone]  After Breast Cancer Class It is recommended you attend the ABC class to be educated on lymphedema risk reduction. This class is free of charge and lasts for 1 hour. It is a 1-time class. You were given information in Spanish today instead.  Scar massage You can begin gentle scar massage stretching the skin a few minutes each day and then massage the scar with coconut oil. Wait until the armpit incision has healed completely before you begin doing that one.  Compression garment Continue wearing your sports bra until you feel like you have no swelling at all.  Home exercise Program Continue your exercises until you finish radiation. Walk 30 minutes each day!  Follow up PT: It is recommended you return every 3 months for the first 3 years following surgery to be assessed on the SOZO machine for an L-Dex score. This helps prevent clinically significant lymphedema in 95% of patients. These follow up screens are 10 minute appointments that you are not billed for. You are scheduled for December 12th, 2022 at 3:00.   Despus de la clase de cncer de mama Se recomienda que asista a la clase ABC para recibir [employer] reduccin del riesgo de [place]. Esta clase es gratuita y tiene una duracin de 1 hora. Es una clase de 1 vez. En cambio, hoy se le dio informacin en espaol.  masaje cicatriz Puede comenzar un masaje suave en la cicatriz estirando la piel unos minutos cada da y Film/video editor la cicatriz con aceite de coco. Espere hasta que la incisin de la axila se haya curado por completo antes de comenzar a hacer eso.  Prenda de compresin [place] usando su sostn deportivo hasta que sienta que no tiene ninguna hinchazn.  Programa de ejercicios en casa [place] con sus ejercicios hasta que termine la radiacin. [place]!  PT de seguimiento: Se recomienda  que regrese cada 3 meses durante los primeros 3 aos posteriores a la ciruga para que lo evalen en la mquina SOZO para obtener una puntuacin L-Dex. Esto ayuda a IT consultant significativo en el 95% de los pacientes. Estas pantallas de seguimiento son citas de 10 minutos que no se le facturan. Est programado para el 12 de diciembre de 2022 a las 3:00.

## 2021-05-01 NOTE — Therapy (Signed)
[facility] @ [place], [place], [address] Phone: [phone]   Fax:  [phone]  Physical Therapy Treatment  Patient Details  Name: Cynthia [name] MRN: [id number] Date of Birth: 1971-03-23 Referring Provider (PT): Dr. Erroll Luna   Encounter Date: 05/01/2021   PT End of Session - 05/01/21 1449     Visit Number 2    Number of Visits 2    PT Start Time 3428    PT Stop Time 7681    PT Time Calculation (min) 49 min    Activity Tolerance Patient tolerated treatment well    Behavior During Therapy [facility] for tasks assessed/performed             Past Medical History:  Diagnosis Date   Allergy    pollen   Anemia    when on dialysis   Anxiety    BRCA2 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 03/01/2021   Breast cancer [facility])    Cataract    surgical repair bilateral   Depression    ESRD on hemodialysis ([place])    Home HD 5x per week- not on dialysis now had tramsplant 4/17   Family history of ovarian cancer 02/22/2021   GERD (gastroesophageal reflux disease)    Hearing loss 2017   right ear   History of blood transfusion    transfusion reaction   Hyperlipidemia    Hypertension    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 per pt-dr levy also II   Kidney transplant recipient    Neuromuscular disorder ([place])    NEUROPATHY   Sleep apnea     Past Surgical History:  Procedure Laterality Date   ABDOMINAL HYSTERECTOMY     BREAST EXCISIONAL BIOPSY Right 08/2015   BREAST LUMPECTOMY WITH RADIOACTIVE SEED LOCALIZATION Right 09/14/2015   Procedure: RIGHT BREAST LUMPECTOMY WITH RADIOACTIVE SEED LOCALIZATION;  Surgeon: Donnie Mesa, MD;  Location: [facility];  Service: General;  Laterality: Right;   BREAST LUMPECTOMY WITH RADIOACTIVE SEED LOCALIZATION Left 03/02/2021   Procedure: LEFT BREAST SEED LUMPECTOMY LEFT SENTINEL LYMPH NODE [place];  Surgeon:  Erroll Luna, MD;  Location: [facility];  Service: General;  Laterality: Left;  GEN AND PEC BLOCK   BREAST SURGERY Bilateral    biopsy bilateral   CATARACT EXTRACTION Bilateral    bilateral   CHOLECYSTECTOMY     CYST REMOVAL NECK     DIALYSIS FISTULA CREATION Left    EYE SURGERY Bilateral    lazer   LEFT HEART CATH AND CORONARY ANGIOGRAPHY N/A 03/04/2019   Procedure: LEFT HEART CATH AND CORONARY ANGIOGRAPHY;  Surgeon: Martinique, Peter M, MD;  Location: [facility];  Service: Cardiovascular;  Laterality: N/A;   LEFT HEART CATHETERIZATION WITH CORONARY ANGIOGRAM N/A 03/30/2014   Procedure: LEFT HEART CATHETERIZATION WITH CORONARY ANGIOGRAM;  Surgeon: Sinclair Grooms, MD;  Location: [facility] CATH LAB;  Service: Cardiovascular;  Laterality: N/A;   LIPOMA EXCISION N/A 02/06/2017   Procedure: EXCISION POSTERIOR NECK SEBACEOUS CYST;  Surgeon: Coralie Keens, MD;  Location: [facility];  Service: General;  Laterality: N/A;   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   Procedure: REPAIR OF ARTERIOVENOUS FISTULA ANEURYSM;  Surgeon: Serafina Mitchell, MD;  Location: [facility];  Service: Vascular;  Laterality: Left;   REVISON OF ARTERIOVENOUS FISTULA Left 09/28/2013   Procedure: EXCISE ESCHAR LEFT ARM  ARTERIOVENOUS FISTULA WITH PLICATION OF LEFT ARM ARTERIOVENOUS  FISTULA;  Surgeon: Elam Dutch, MD;  Location: [facility];  Service: Vascular;  Laterality: Left;   REVISON OF ARTERIOVENOUS FISTULA Left 08/26/2015   Procedure: RESECTION ANEURYSM OF LEFT ARM ARTERIOVENOUS FISTULA  ;  Surgeon: Serafina Mitchell, MD;  Location: [facility];  Service: Vascular;  Laterality: Left;   TUBAL LIGATION      There were no vitals filed for this visit.   Subjective Assessment - 05/01/21 1403     Subjective Patient reports she underwent a left lumpectomy and sentinel node biopsy (6 negative nodes) on 03/02/2021. She reports she the axillary incision developed a wound and is still not closed. She is scheduled  to begin chemotherapy tomorrow.    Pertinent History Patient was diagnosed on 02/09/2021 with left grade II invasive ductal carcinoma breast cancer. She underwent a left lumpectomy and sentinel node biopsy (6 negative nodes) on 03/02/2021. It is ER/PR positive and HER2 negative with a Ki67 of 20%. She had a kidney transplant 10/14/2015.    Patient Stated Goals See if my arm is doing ok    Currently in Pain? Yes    Pain Score 3     Pain Location Breast    Pain Orientation Left    Pain Descriptors / Indicators Burning    Pain Type Surgical pain    Pain Onset More than a month ago    Aggravating Factors  Doing home tasks    Pain Relieving Factors Waking up in the morning                [facility] PT Assessment - 05/01/21 0001       Assessment   Medical Diagnosis s/p left lumpectomy and SLNB    Referring Provider (PT) Dr. Marcello Moores [place]    Onset Date/Surgical Date 03/02/21    Hand Dominance Right    Prior Therapy baselines      Precautions   Precautions Other (comment)    Precaution Comments left arm lymphedema risk; kidney transplant      Restrictions   Weight Bearing Restrictions No      Balance Screen   Has the patient fallen in the past 6 months No    Has the patient had a decrease in activity level because of a fear of falling?  No    Is the patient reluctant to leave their home because of a fear of falling?  No      Home Ecologist residence    Living Arrangements Spouse/significant other   Partner   Available Help at Discharge Family      Prior Function   Level of Independence Independent    Vocation Unemployed    Leisure She does not exercise      Cognition   Overall Cognitive Status Within Functional Limits for tasks assessed      Observation/Other Assessments   Observations Inferior left breast incision is completed healed with some scar tissue tightness present. Left axillary incision is covered with a wound dressing so PT did not  uncover as she did not have something to redress it with. Pt reports it has improved. No significant edema present.      Posture/Postural Control   Posture/Postural Control Postural limitations    Postural Limitations Rounded Shoulders;Forward head      ROM / Strength   AROM / PROM / Strength AROM      AROM   AROM Assessment Site Shoulder    Right/Left Shoulder Left  Left Shoulder Extension 44 Degrees    Left Shoulder Flexion 146 Degrees    Left Shoulder ABduction 164 Degrees    Left Shoulder Internal Rotation 62 Degrees    Left Shoulder External Rotation 86 Degrees      Strength   Overall Strength Unable to assess;Due to precautions               LYMPHEDEMA/ONCOLOGY QUESTIONNAIRE - 05/01/21 0001       Type   Cancer Type Left breast cancer      Surgeries   Lumpectomy Date 03/02/21    Sentinel Lymph Node Biopsy Date 03/02/21    Number Lymph Nodes Removed 6      Treatment   Active Chemotherapy Treatment No    Past Chemotherapy Treatment No    Active Radiation Treatment No    Past Radiation Treatment No    Current Hormone Treatment No    Past Hormone Therapy No      What other symptoms do you have   Are you Having Heaviness or Tightness No    Are you having Pain Yes    Are you having pitting edema No    Is it Hard or Difficult finding clothes that fit No    Do you have infections No    Is there Decreased scar mobility Yes    Stemmer Sign No      Lymphedema Assessments   Lymphedema Assessments Upper extremities      Right Upper Extremity Lymphedema   10 cm Proximal to Olecranon Process 33.9 cm    Olecranon Process 28.5 cm    10 cm Proximal to Ulnar Styloid Process 25.8 cm    Just Proximal to Ulnar Styloid Process 18.2 cm    Across Hand at [employer] 20.4 cm    At [place] of 2nd Digit 7.1 cm      Left Upper Extremity Lymphedema   10 cm Proximal to Olecranon Process 38.6 cm   fistula from dialysis   Olecranon Process 29 cm    10 cm Proximal to Ulnar  Styloid Process 24.7 cm    Just Proximal to Ulnar Styloid Process 18.9 cm    Across Hand at [employer] 19.5 cm    At [place] of 2nd Digit 7.2 cm                Quick Dash - 05/01/21 0001     Open a tight or new jar Mild difficulty    Do heavy household chores (wash walls, wash floors) Mild difficulty    Carry a shopping bag or briefcase Moderate difficulty    Wash your back No difficulty    Use a knife to cut food No difficulty    Recreational activities in which you take some force or impact through your arm, shoulder, or hand (golf, hammering, tennis) Mild difficulty    During the past week, to what extent has your arm, shoulder or hand problem interfered with your normal social activities with family, friends, neighbors, or groups? Slightly    During the past week, to what extent has your arm, shoulder or hand problem limited your work or other regular daily activities Slightly    Arm, shoulder, or hand pain. Moderate    Tingling (pins and needles) in your arm, shoulder, or hand Mild    Difficulty Sleeping Mild difficulty    DASH Score 25 %  PT Education - 05/01/21 1448     Education Details Aftercare; lymphedema education; scar massage    Person(s) Educated Patient    Methods Explanation;Demonstration;Handout    Comprehension Returned demonstration;Verbalized understanding                 PT Long Term Goals - 05/01/21 1455       PT LONG TERM GOAL #1   Title Patient will demonstrate she has regained full shoulder ROM and function post operatively compared to baselines.    Time 8    Period Weeks    Status Partially Met                   Plan - 05/01/21 1449     Clinical Impression Statement Patient is doing well s/p left lumpectomy and sentinel node biopsy (6 negative nodes) on 03/02/2021. She has regained full shoulder ROM but is limited by a wound that developed at her axillary incision site. She  reports this is improving and she is being monitored by her surgeon for this. She plans to begin chemotherapy tomorrow 05/02/2021. She was given written materials in Spanish related to lymphedema education. She knows to contact us if she has concerns but otherwise has no PT needs at this time.    PT Treatment/Interventions ADLs/Self Care Home Management;Therapeutic exercise;Patient/family education    PT Next Visit Plan D?C    PT Home Exercise Plan Post op HEP    Consulted and Agree with Plan of Care Patient             Patient will benefit from skilled therapeutic intervention in order to improve the following deficits and impairments:  Postural dysfunction, Decreased range of motion, Decreased knowledge of precautions, Impaired UE functional use, Pain  Visit Diagnosis: Malignant neoplasm of lower-inner quadrant of left breast in female, estrogen receptor positive (HCC)  Abnormal posture  Aftercare following surgery for neoplasm     Problem List Patient Active Problem List   Diagnosis Date Noted   Aortic atherosclerosis ([place]) 03/07/2021   Hypercholesterolemia 03/07/2021   BRCA2 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 03/01/2021   Genetic testing 03/01/2021   Family history of ovarian cancer 02/22/2021   Malignant neoplasm of lower-inner quadrant of left breast in female, estrogen receptor positive ([place]) 02/15/2021   Foot pain, bilateral 07/28/2020   COVID-19 virus infection 06/16/2019   Insulin dependent type 2 diabetes mellitus ([place]) 06/06/2019   Hyperglycemia 06/02/2019   DKA (diabetic ketoacidoses) 06/02/2019   HTN (hypertension) 03/04/2019   Abnormal nuclear stress test 03/04/2019   Diabetic nephropathy ([place]) 03/03/2019   History of renal transplant 11/18/2015   Diabetic retinopathy associated with type 2 diabetes mellitus ([place]) 04/13/2015   Postprandial vomiting    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   Anemia in CKD (chronic kidney disease) 01/04/2015   Pseudoaneurysm of  arteriovenous dialysis fistula ([place]) 03/16/2014   Hyperkalemia 11/16/2013   Chronic constipation 07/28/2013   Paresthesias 07/28/2013   Diabetes mellitus type I ([place])    Kidney transplant status 05/28/2013   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/11/2013   Precordial chest pain 02/10/2013   Awaiting organ transplant 09/19/2012   PHYSICAL THERAPY DISCHARGE SUMMARY  Visits from Start of Care: 2  Current functional level related to goals / functional outcomes: See above for objective measurements.   Remaining deficits: Left UE functional limitations due to axillary wound.   Education / Equipment: Lymphedema education and HEP  Patient agrees to discharge. Patient goals were met. Patient is being discharged due to being pleased  with the current functional level.  Annia Friendly, [place] 05/01/21 2:59 PM    [facility] @ [place], [place], [address] Phone: [phone]   Fax:  [phone]  Name: Nikky Duba MRN: [id number] Date of Birth: 23-Sep-1970

## 2021-05-02 ENCOUNTER — Inpatient Hospital Stay (HOSPITAL_BASED_OUTPATIENT_CLINIC_OR_DEPARTMENT_OTHER): Payer: Medicare HMO | Admitting: Nurse Practitioner

## 2021-05-02 ENCOUNTER — Inpatient Hospital Stay: Payer: Medicare HMO

## 2021-05-02 ENCOUNTER — Encounter: Payer: Self-pay | Admitting: Nurse Practitioner

## 2021-05-02 ENCOUNTER — Encounter: Payer: Self-pay | Admitting: Hematology

## 2021-05-02 ENCOUNTER — Encounter: Payer: Self-pay | Admitting: *Deleted

## 2021-05-02 ENCOUNTER — Inpatient Hospital Stay: Payer: Medicare HMO | Attending: Hematology

## 2021-05-02 VITALS — BP 145/66 | HR 66 | Temp 97.7°F | Resp 18 | Wt 215.1 lb

## 2021-05-02 VITALS — BP 186/73 | HR 64 | Temp 97.6°F | Resp 18

## 2021-05-02 DIAGNOSIS — Z5189 Encounter for other specified aftercare: Secondary | ICD-10-CM | POA: Insufficient documentation

## 2021-05-02 DIAGNOSIS — C50312 Malignant neoplasm of lower-inner quadrant of left female breast: Secondary | ICD-10-CM

## 2021-05-02 DIAGNOSIS — Z1509 Genetic susceptibility to other malignant neoplasm: Secondary | ICD-10-CM

## 2021-05-02 DIAGNOSIS — Z1501 Genetic susceptibility to malignant neoplasm of breast: Secondary | ICD-10-CM

## 2021-05-02 DIAGNOSIS — R112 Nausea with vomiting, unspecified: Secondary | ICD-10-CM | POA: Insufficient documentation

## 2021-05-02 DIAGNOSIS — R197 Diarrhea, unspecified: Secondary | ICD-10-CM | POA: Insufficient documentation

## 2021-05-02 DIAGNOSIS — Z170 Estrogen receptor positive status [ER+]: Secondary | ICD-10-CM

## 2021-05-02 DIAGNOSIS — Z5111 Encounter for antineoplastic chemotherapy: Secondary | ICD-10-CM | POA: Diagnosis present

## 2021-05-02 DIAGNOSIS — Z1502 Genetic susceptibility to malignant neoplasm of ovary: Secondary | ICD-10-CM | POA: Diagnosis not present

## 2021-05-02 DIAGNOSIS — R52 Pain, unspecified: Secondary | ICD-10-CM | POA: Insufficient documentation

## 2021-05-02 LAB — CMP (CANCER CENTER ONLY)
ALT: 41 U/L (ref 0–44)
AST: 21 U/L (ref 15–41)
Albumin: 3.7 g/dL (ref 3.5–5.0)
Alkaline Phosphatase: 85 U/L (ref 38–126)
Anion gap: 7 (ref 5–15)
BUN: 26 mg/dL — ABNORMAL HIGH (ref 6–20)
CO2: 24 mmol/L (ref 22–32)
Calcium: 9.9 mg/dL (ref 8.9–10.3)
Chloride: 107 mmol/L (ref 98–111)
Creatinine: 1.24 mg/dL — ABNORMAL HIGH (ref 0.44–1.00)
GFR, Estimated: 52 mL/min — ABNORMAL LOW (ref >60)
Glucose, Bld: 165 mg/dL — ABNORMAL HIGH (ref 70–99)
Potassium: 4.7 mmol/L (ref 3.5–5.1)
Sodium: 138 mmol/L (ref 135–145)
Total Bilirubin: 0.4 mg/dL (ref 0.3–1.2)
Total Protein: 6.5 g/dL (ref 6.5–8.1)

## 2021-05-02 LAB — CBC WITH DIFFERENTIAL (CANCER CENTER ONLY)
Abs Immature Granulocytes: 0.05 10*3/uL (ref 0.00–0.07)
Basophils Absolute: 0.1 10*3/uL (ref 0.0–0.1)
Basophils Relative: 1 %
Eosinophils Absolute: 0.3 10*3/uL (ref 0.0–0.5)
Eosinophils Relative: 4 %
HCT: 40.3 % (ref 36.0–46.0)
Hemoglobin: 13.3 g/dL (ref 12.0–15.0)
Immature Granulocytes: 1 %
Lymphocytes Relative: 28 %
Lymphs Abs: 2 10*3/uL (ref 0.7–4.0)
MCH: 29.8 pg (ref 26.0–34.0)
MCHC: 33 g/dL (ref 30.0–36.0)
MCV: 90.2 fL (ref 80.0–100.0)
Monocytes Absolute: 0.6 10*3/uL (ref 0.1–1.0)
Monocytes Relative: 9 %
Neutro Abs: 4.3 10*3/uL (ref 1.7–7.7)
Neutrophils Relative %: 57 %
Platelet Count: 165 10*3/uL (ref 150–400)
RBC: 4.47 MIL/uL (ref 3.87–5.11)
RDW: 12.5 % (ref 11.5–15.5)
WBC Count: 7.3 10*3/uL (ref 4.0–10.5)
nRBC: 0 % (ref 0.0–0.2)

## 2021-05-02 MED ORDER — SODIUM CHLORIDE 0.9 % IV SOLN
500.0000 mg/m2 | Freq: Once | INTRAVENOUS | Status: AC
  Administered 2021-05-02: 1000 mg via INTRAVENOUS
  Filled 2021-05-02: qty 50

## 2021-05-02 MED ORDER — PROCHLORPERAZINE MALEATE 10 MG PO TABS: 10.0000 mg | ORAL_TABLET | Freq: Four times a day (QID) | ORAL | 1 refills | Status: DC | PRN

## 2021-05-02 MED ORDER — SODIUM CHLORIDE 0.9 % IV SOLN
10.0000 mg | Freq: Once | INTRAVENOUS | Status: AC
  Administered 2021-05-02: 10 mg via INTRAVENOUS
  Filled 2021-05-02: qty 10

## 2021-05-02 MED ORDER — PALONOSETRON HCL INJECTION 0.25 MG/5ML
0.2500 mg | Freq: Once | INTRAVENOUS | Status: AC
  Administered 2021-05-02: 0.25 mg via INTRAVENOUS
  Filled 2021-05-02: qty 5

## 2021-05-02 MED ORDER — DEXAMETHASONE 4 MG PO TABS: 8.0000 mg | ORAL_TABLET | Freq: Two times a day (BID) | ORAL | 1 refills | Status: DC

## 2021-05-02 MED ORDER — ONDANSETRON HCL 8 MG PO TABS: 8.0000 mg | ORAL_TABLET | Freq: Two times a day (BID) | ORAL | 1 refills | Status: DC | PRN

## 2021-05-02 MED ORDER — SODIUM CHLORIDE 0.9 % IV SOLN
60.0000 mg/m2 | Freq: Once | INTRAVENOUS | Status: AC
  Administered 2021-05-02: 120 mg via INTRAVENOUS
  Filled 2021-05-02: qty 12

## 2021-05-02 MED ORDER — SODIUM CHLORIDE 0.9 % IV SOLN: Freq: Once | INTRAVENOUS | Status: AC

## 2021-05-02 NOTE — Progress Notes (Signed)
[place]   [phone]) [phone] [phone]   Clinic Follow up Note   Patient Care Team: Arthur Holms, NP as PCP - General (Nurse Practitioner) Buford Dresser, MD as PCP - Cardiology (Cardiology) Rexene Agent, MD as Consulting Physician (Nephrology) Dwana Melena, MD as Referring Physician (Nephrology) Rockwell Germany, RN as Oncology Nurse Navigator Mauro Kaufmann, RN as Oncology Nurse Navigator Truitt Merle, MD as Consulting Physician (Hematology) Erroll Luna, MD as Consulting Physician (General Surgery) Eppie Gibson, MD as Attending Physician (Radiation Oncology) 05/02/2021  CHIEF COMPLAINT: Follow up left breast cancer   SUMMARY OF ONCOLOGIC HISTORY: Oncology History Overview Note  Cancer Staging Malignant neoplasm of lower-inner quadrant of left breast in female, estrogen receptor positive ([place]) Staging form: Breast, AJCC 8th Edition - Clinical stage from 02/09/2021: Stage IA (cT1c, cN0, cM0, G2, ER+, PR+, HER2-) - Signed by Truitt Merle, MD on 02/20/2021 Stage prefix: Initial diagnosis Histologic grading system: 3 grade system - Pathologic stage from 03/02/2021: Stage IA (pT2, pN0, cM0, G2, ER+, PR+, HER2-, Oncotype DX score: 26) - Signed by Truitt Merle, MD on 03/21/2021 Stage prefix: Initial diagnosis Multigene prognostic tests performed: Oncotype DX Recurrence score range: Greater than or equal to 11 Histologic grading system: 3 grade system Residual tumor (R): R0 - None    Malignant neoplasm of lower-inner quadrant of left breast in female, estrogen receptor positive ([place])  02/07/2021 Mammogram   Exam: Left Diagnostic Mammogram; Left Breast Ultrasound  IMPRESSION: Irregular mass in left breast at 6:30, measuring 1.6 cm by mammogram, is highly suggestive of malignancy. Left axillary ultrasound is negative for lymphadenopathy.   02/09/2021 Cancer Staging   Staging form: Breast, AJCC 8th Edition - Clinical stage from 02/09/2021: Stage IA  (cT1c, cN0, cM0, G2, ER+, PR+, HER2-) - Signed by Truitt Merle, MD on 02/20/2021 Stage prefix: Initial diagnosis Histologic grading system: 3 grade system    02/09/2021 Pathology Results   Diagnosis Breast, left, needle core biopsy, 6:30 o'clock 8cm fn - INVASIVE DUCTAL CARCINOMA WITH HISTIOCYTOID FEATURES. SEE NOTE Diagnosis Note Carcinoma measures 1 cm in greatest linear dimension and appears grade 2.  PROGNOSTIC INDICATORS Results: IMMUNOHISTOCHEMICAL AND MORPHOMETRIC ANALYSIS PERFORMED MANUALLY The tumor cells are EQUIVOCAL for Her2 (2+). Her2 by FISH will be performed and results performed separately. Estrogen Receptor: >95%, POSITIVE, STRONG STAINING INTENSITY Progesterone Receptor: 90%, POSITIVE, STRONG STAINING INTENSITY Proliferation Marker Ki67: 20%  FLUORESCENCE IN-SITU HYBRIDIZATION Results: GROUP 5: HER2 **NEGATIVE** Equivocal form of amplification of the HER2 gene was detected in the IHC 2+ tissue sample received from this individual. HER2 FISH was performed by a technologist and cell imaging and analysis on the [employer]. RATIO OF HER2/CEN17 SIGNALS 1.30 AVERAGE HER2 COPY NUMBER PER CELL 1.75   02/15/2021 Initial Diagnosis   Malignant neoplasm of lower-inner quadrant of left breast in female, estrogen receptor positive ([place])   03/01/2021 Genetic Testing   Positive genetic testing: pathogenic variant detected in BRCA2 at [id number].  No other pathogenic variants detected in [facility] CustomNext Panel.  The report date is 03/19/2021.  The CustomNext-Cancer+RNAinsight panel offered by [facility] includes sequencing and rearrangement analysis for the following 47 genes:  APC, ATM, AXIN2, BARD1, BMPR1A, BRCA1, BRCA2, BRIP1, CDH1, CDK4, CDKN2A, CHEK2, DICER1, EPCAM, GREM1, HOXB13, MEN1, MLH1, MSH2, MSH3, MSH6, MUTYH, NBN, NF1, NF2, NTHL1, PALB2, PMS2, POLD1, POLE, PTEN, RAD51C, RAD51D, RECQL, RET, SDHA, SDHAF2, SDHB, SDHC, SDHD, SMAD4, SMARCA4, STK11, TP53, TSC1, TSC2, and VHL.   RNA data is routinely analyzed for use in variant  interpretation for all genes.   03/02/2021 Cancer Staging   Staging form: Breast, AJCC 8th Edition - Pathologic stage from 03/02/2021: Stage IA (pT2, pN0, cM0, G2, ER+, PR+, HER2-, Oncotype DX score: 26) - Signed by Truitt Merle, MD on 03/21/2021 Stage prefix: Initial diagnosis Multigene prognostic tests performed: Oncotype DX Recurrence score range: Greater than or equal to 11 Histologic grading system: 3 grade system Residual tumor (R): R0 - None    05/02/2021 -  Chemotherapy   Patient is on Treatment Plan : BREAST TC q21d       CURRENT THERAPY: PENDING adjuvant TC q21 days x4 cycles starting 05/02/21  INTERVAL HISTORY: Cynthia [name] returns with her daughter who is interpreting for follow up and treatment as scheduled.  She declined a remote [employer] interpreter.  Last seen by Dr. Burr Medico 03/21/21 and Dr. Brantley Stage in the interim, she had delayed wound healing.  She continues once daily dressing change with wet gauze, denies pain, drainage, fever or chills.  She is otherwise doing well, eating and drinking well.  She received her COVID-19 booster but not evushield.  LMP with hysterectomy in 2001 for uterine fibroids/cysts and recurrent infections, she would be open to removing her ovaries.  She underwent right kidney transplant in 2017 due to renal failure, she has been doing well on immunosuppressants.  Denies cough, chest pain, dyspnea, N/V, constipation, diarrhea, or any other concerns.   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 per pt-dr levy also II   Kidney transplant recipient    Neuromuscular disorder ([place])    NEUROPATHY   Sleep apnea     SURGICAL HISTORY: Past Surgical History:  Procedure Laterality Date   ABDOMINAL HYSTERECTOMY     BREAST EXCISIONAL BIOPSY Right 08/2015   BREAST LUMPECTOMY WITH RADIOACTIVE SEED LOCALIZATION Right 09/14/2015   Procedure: RIGHT BREAST LUMPECTOMY WITH RADIOACTIVE SEED LOCALIZATION;  Surgeon: Donnie Mesa, MD;  Location: [place];  Service: General;  Laterality: Right;   BREAST LUMPECTOMY WITH RADIOACTIVE SEED LOCALIZATION Left 03/02/2021   Procedure: LEFT BREAST SEED LUMPECTOMY LEFT SENTINEL LYMPH NODE [place];  Surgeon: Erroll Luna, MD;  Location: [place];  Service: General;  Laterality: Left;  GEN AND PEC BLOCK   BREAST SURGERY Bilateral    biopsy bilateral   CATARACT EXTRACTION Bilateral    bilateral   CHOLECYSTECTOMY     CYST REMOVAL NECK     DIALYSIS FISTULA CREATION Left    EYE SURGERY Bilateral    lazer   LEFT HEART CATH AND CORONARY ANGIOGRAPHY N/A 03/04/2019   Procedure: LEFT HEART CATH AND CORONARY ANGIOGRAPHY;  Surgeon: Martinique, Peter M, MD;  Location: [place] CV LAB;  Service: Cardiovascular;  Laterality: N/A;   LEFT HEART  CATHETERIZATION WITH CORONARY ANGIOGRAM N/A 03/30/2014   Procedure: LEFT HEART CATHETERIZATION WITH CORONARY ANGIOGRAM;  Surgeon: Sinclair Grooms, MD;  Location: [facility] CATH LAB;  Service: Cardiovascular;  Laterality: N/A;   LIPOMA EXCISION N/A 02/06/2017   Procedure: EXCISION POSTERIOR NECK SEBACEOUS CYST;  Surgeon: Coralie Keens, MD;  Location: [place];  Service: General;  Laterality: N/A;   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   Procedure: REPAIR OF ARTERIOVENOUS FISTULA ANEURYSM;  Surgeon: Serafina Mitchell, MD;  Location: [facility];  Service: Vascular;  Laterality: Left;   REVISON OF ARTERIOVENOUS FISTULA Left 09/28/2013   Procedure: EXCISE ESCHAR LEFT ARM  ARTERIOVENOUS FISTULA WITH PLICATION OF LEFT ARM ARTERIOVENOUS FISTULA;  Surgeon: Elam Dutch, MD;  Location: [place];  Service: Vascular;  Laterality: Left;   REVISON OF ARTERIOVENOUS FISTULA Left 08/26/2015   Procedure: RESECTION ANEURYSM OF LEFT ARM ARTERIOVENOUS FISTULA  ;  Surgeon: Serafina Mitchell, MD;  Location: [place];  Service: Vascular;  Laterality: Left;   TUBAL LIGATION      I have reviewed the social history and family history with the patient and they are unchanged from previous note.  ALLERGIES:  is allergic to other, norvasc [amlodipine], and tape.  MEDICATIONS:  Current Outpatient Medications  Medication Sig Dispense Refill   Accu-Chek Softclix Lancets lancets Use as instructed 100 each 12   aspirin EC 81 MG EC tablet Take 1 tablet (81 mg total) by mouth daily. (Patient taking differently: Take 81 mg by mouth at bedtime.) 90 tablet 3   B Complex-C (SUPER B COMPLEX PO) Take 1 tablet by mouth daily.      Blood Glucose Monitoring Suppl (ACCU-CHEK AVIVA CONNECT) w/Device KIT Check sugar 1x daily 1 kit 0   carvedilol (COREG) 25 MG tablet Take 12.5 mg by mouth 2 (two) times daily with a meal.      cetirizine (ZYRTEC) 10 MG tablet Take 5 mg by mouth daily. Takes 0.5 tablet daily     Cholecalciferol (VITAMIN D3) 1000 units CAPS Take 1,000 Units by mouth daily.      dexamethasone (DECADRON) 4 MG tablet Take 2 tablets (8 mg total) by mouth 2 (two) times daily. Start the day before Taxotere. Then again the day after chemo for 3 days. 30 tablet 1   fludrocortisone (FLORINEF) 0.1 MG tablet Take 0.1 mg by mouth daily.     FLUoxetine (PROZAC) 40 MG capsule Take 40 mg by mouth daily.     furosemide (LASIX) 40 MG tablet Take 40 mg by mouth 2 (two) times daily.     gabapentin (NEURONTIN) 300 MG capsule Take 300 mg by mouth at bedtime.  12    glucose blood (ACCU-CHEK AVIVA PLUS) test strip Check sugar 1x daily 100 each 12   ibuprofen (ADVIL) 800 MG tablet Take 1 tablet (800 mg total) by mouth every 8 (eight) hours as needed. 30 tablet 0   insulin glargine (LANTUS SOLOSTAR) 100 UNIT/ML Solostar Pen Inject 65 Units into the skin every morning. 75 mL PRN   Insulin Pen Needle (PEN NEEDLES) 30G X 8 MM MISC 1 each by Does not apply route daily. E11.9 90 each 0   magnesium oxide (MAG-OX) 400 MG tablet Take 400 mg by mouth 2 (two) times daily.      mycophenolate (MYFORTIC) 180 MG EC tablet Take 360 mg by mouth 2 (two) times daily.     ondansetron (ZOFRAN) 8 MG tablet Take 1 tablet (8 mg total) by mouth 2 (two)  times daily as needed for refractory nausea / vomiting. Start on day 3 after chemo. 30 tablet 1   oxyCODONE (OXY IR/ROXICODONE) 5 MG immediate release tablet Take 1 tablet (5 mg total) by mouth every 6 (six) hours as needed for severe pain. 15 tablet 0   pantoprazole (PROTONIX) 40 MG tablet Take 40 mg by mouth daily.  5   predniSONE (DELTASONE) 5 MG tablet Take 5 mg by mouth daily.   2   prochlorperazine (COMPAZINE) 10 MG tablet Take 1 tablet (10 mg total) by mouth every 6 (six) hours as needed (Nausea or vomiting). 30 tablet 1   rosuvastatin (CRESTOR) 5 MG tablet Take 5 mg by mouth at bedtime.     Tacrolimus ER (ENVARSUS XR) 1 MG TB24 Take 3 mg by mouth every morning.     vitamin E 400 UNIT capsule Take 400 Units by mouth daily.     No current facility-administered medications for this visit.   Facility-Administered Medications Ordered in Other Visits  Medication Dose Route Frequency Provider Last Rate Last Admin   cyclophosphamide (CYTOXAN) 1,000 mg in sodium chloride 0.9 % 250 mL chemo infusion  500 mg/m2 (Treatment Plan Recorded) Intravenous Once Truitt Merle, MD       DOCEtaxel (TAXOTERE) 120 mg in sodium chloride 0.9 % 250 mL chemo infusion  60 mg/m2 (Treatment Plan Recorded) Intravenous Once Truitt Merle, MD        PHYSICAL  EXAMINATION: ECOG PERFORMANCE STATUS: 0 - Asymptomatic  Vitals:   05/02/21 0958  BP: (!) 145/66  Pulse: 66  Resp: 18  Temp: 97.7 F (36.5 C)  SpO2: 93%   Filed Weights   05/02/21 0958  Weight: 215 lb 1 oz (97.6 kg)    GENERAL:alert, no distress and comfortable SKIN: no rash  EYES: sclera clear LUNGS: clear with normal breathing effort HEART: regular rate & rhythm, murmur noted, no lower extremity edema NEURO: alert & oriented x 3 with fluent speech, no focal motor/sensory deficits Breast exam: small open left axillary wound without erythema or drainage. See image. Complete breast exam deferred    LABORATORY DATA:  I have reviewed the data as listed CBC Latest Ref Rng & Units 05/02/2021 02/28/2021 02/22/2021  WBC 4.0 - 10.5 K/uL 7.3 8.0 6.0  Hemoglobin 12.0 - 15.0 g/dL 13.3 13.9 13.7  Hematocrit 36.0 - 46.0 % 40.3 43.0 40.0  Platelets 150 - 400 K/uL 165 175 168     CMP Latest Ref Rng & Units 05/02/2021 02/28/2021 02/22/2021  Glucose 70 - 99 mg/dL 165(H) 42(LL) 67(L)  BUN 6 - 20 mg/dL 26(H) 25(H) 23(H)  Creatinine 0.44 - 1.00 mg/dL 1.24(H) 1.58(H) 1.27(H)  Sodium 135 - 145 mmol/L 138 139 141  Potassium 3.5 - 5.1 mmol/L 4.7 4.4 4.2  Chloride 98 - 111 mmol/L 107 104 108  CO2 22 - 32 mmol/L '24 26 25  ' Calcium 8.9 - 10.3 mg/dL 9.9 10.4(H) 10.6(H)  Total Protein 6.5 - 8.1 g/dL 6.5 6.6 6.8  Total Bilirubin 0.3 - 1.2 mg/dL 0.4 0.4 0.4  Alkaline Phos 38 - 126 U/L 85 77 92  AST 15 - 41 U/L 21 26 32  ALT 0 - 44 U/L 41 47(H) 58(H)      RADIOGRAPHIC STUDIES: I have personally reviewed the radiological images as listed and agreed with the findings in the report. No results found.   ASSESSMENT & PLAN: 50 yo female   1. Malignant neoplasm of lower-inner quadrant of left breast, Stage IS, c(T1c, N0), ER+/PR+/HER2-, Grade  2  -Diagnosed 02/09/2021, s/p left lumpectomy by Dr. Brantley Stage 03/02/2021, surgical path showed invasive and in situ ductal carcinoma, grade 2, node-negative with close  but clear margins.  ER/PR positive, HER2 negative, Ki-67 of 20%, this was staged PT2PN0.  Oncotype showed high risk with recurrence score 26 which predicts 9-year distant recurrence after 5 years of tamoxifen at 16%, she would benefit from adjuvant chemo and TC with GCSF q3 weeks x4 was recommended.  Treatment goal of adjuvant chemo is curative.  She previously consented -She has had delayed wound healing of the left axillary incision, likely due to immunosuppressants from previous kidney transplant.  Dr. Brantley Stage feels she is healed enough to begin chemo -Cynthia [name] appears stable. Wound continues to heal, we reviewed red flags. Labs adequate to proceed with C1 adjuvant TC today as planned. Dr. Burr Medico dose reduced cycle 1 due to open wound. Will receive GCSF on day 3.  -we again reviewed potential side effects, symptom management, benefit, and curative goal of adjuvant chemo. She previously consented.  -Toxicity check in 1 week, then f/up and C2 in 3 weeks.    2. Comorbidities: DM with neuropathy, HTN, s/p kidney transplant -continue medications and f/u -she has diabetic neuropathy at baseline, on gabapentin -On prednisone and tacrolimus following right kidney transplant in 2017.  I will send a message to nephrologist to inform them that we are starting chemo   3. Covid-19+ 05/2019 -she received booster recently   4.  BRCA2 + pathogenic mutation  -Found on BRCA1/2 analysis and [facility] genetics panel from 02/22/2021 -We reviewed the increased risk of recurrent breast and or contralateral breast cancer, ovarian cancer, pancreatic cancer, and melanoma -She is reluctant to consider another breast surgery at this time given her delayed healing from lumpectomy.  If she is not interested in risk reducing mastectomy, we would likely recommend close surveillance with annual mammogram and screening breast MRI in the future -She is s/p hysterectomy in 2001 at which time she became menopausal, ovaries remain.  She  is open to oophorectomy, plan to refer her to gyn onc after she completes chemo to discuss screening vs risk reducing surgery. If she does not proceed with surgery soon we may start her on ovarian suppression after chemo -she has no family history of pancreatic cancer, we reviewed screening options. I will likely refer her to GI for discussion of EUS vs MRI and tumor markers after chemo -she has no personal h/o skin cancer, I will refer her to derm for screening after chemo -her 3 daughters have been tested and all are negative.   PLAN: -Genetic results and today's labs reviewed -Reviewed chemo plan, SE's, symptom management, and goal of therapy  -Proceed with cycle 1 dose reduced TC today as planned, GCSF on day 3 -In person lab and toxicity check next week, monitor left axillary open wound -F/up and C2 in 3 weeks  All questions were answered. The patient knows to call the clinic with any problems, questions or concerns. No barriers to learning were detected. Total encounter time was 30 minutes.      Cynthia Feeling, NP 05/02/21

## 2021-05-02 NOTE — Patient Instructions (Signed)
[facility]COL[facility]  Discharge Instructions: Thank you for choosing [facility] to provide your oncology and hematology care.   If you have a lab appointment with the [facility], please go directly to the [facility] and check in at the registration area.   Wear comfortable clothing and clothing appropriate for easy access to any Portacath or PICC line.   We strive to give you quality time with your provider. You may need to reschedule your appointment if you arrive late (15 or more minutes).  Arriving late affects you and other patients whose appointments are after yours.  Also, if you miss three or more appointments without notifying the office, you may be dismissed from the clinic at the provider's discretion.      For prescription refill requests, have your pharmacy contact our office and allow 72 hours for refills to be completed.    Today you received the following chemotherapy and/or immunotherapy agents Taxotere and Cytoxan      To help prevent nausea and vomiting after your treatment, we encourage you to take your nausea medication as directed. No Zofran for 3 days. Take Compazine instead. Take Decadron as directed.   BELOW ARE SYMPTOMS THAT SHOULD BE REPORTED IMMEDIATELY: *FEVER GREATER THAN 100.4 F (38 C) OR HIGHER *CHILLS OR SWEATING *NAUSEA AND VOMITING THAT IS NOT CONTROLLED WITH YOUR NAUSEA MEDICATION *UNUSUAL SHORTNESS OF BREATH *UNUSUAL BRUISING OR BLEEDING *URINARY PROBLEMS (pain or burning when urinating, or frequent urination) *BOWEL PROBLEMS (unusual diarrhea, constipation, pain near the anus) TENDERNESS IN MOUTH AND THROAT WITH OR WITHOUT PRESENCE OF ULCERS (sore throat, sores in mouth, or a toothache) UNUSUAL RASH, SWELLING OR PAIN  UNUSUAL VAGINAL DISCHARGE OR ITCHING   Items with * indicate a potential emergency and should be followed up as soon as possible or go to the Emergency Department if any problems should  occur.  Please show the CHEMOTHERAPY ALERT CARD or IMMUNOTHERAPY ALERT CARD at check-in to the Emergency Department and triage nurse.  Should you have questions after your visit or need to cancel or reschedule your appointment, please contact [facility]  Dept: [phone]  and follow the prompts.  Office hours are 8:00 a.m. to 4:30 p.m. Monday - Friday. Please note that voicemails left after 4:00 p.m. may not be returned until the following business day.  We are closed weekends and major holidays. You have access to a nurse at all times for urgent questions. Please call the main number to the clinic Dept: [phone] and follow the prompts.   For any non-urgent questions, you may also contact your provider using MyChart. We now offer e-Visits for anyone 64 and older to request care online for non-urgent symptoms. For details visit [email or web address].   Also download the MyChart app! Go to the app store, search "MyChart", open the app, select [facility], and log in with your MyChart username and password.  Due to Covid, a mask is required upon entering the hospital/clinic. If you do not have a mask, one will be given to you upon arrival. For doctor visits, patients may have 1 support person aged 79 or older with them. For treatment visits, patients cannot have anyone with them due to current Covid guidelines and our immunocompromised population.

## 2021-05-02 NOTE — Progress Notes (Signed)
Met w/ pt to introduce myself as her Arboriculturist and to discuss the [employer].  Pt has 2 insurances so copay assistance shouldn't be needed.  I informed her of the [employer], went over what it covers, gave her the income requirement and an expense sheet.  Pt would like to apply and since she's not working she will provide a letter of support.  Once received I will approve her for the grant.  She has my card for any questions or concerns she may have in the future.

## 2021-05-03 ENCOUNTER — Ambulatory Visit (HOSPITAL_BASED_OUTPATIENT_CLINIC_OR_DEPARTMENT_OTHER): Payer: Medicare HMO | Admitting: Cardiology

## 2021-05-03 ENCOUNTER — Telehealth: Payer: Self-pay | Admitting: Hematology

## 2021-05-03 LAB — ESTRADIOL: Estradiol: 50.3 pg/mL

## 2021-05-03 LAB — FOLLICLE STIMULATING HORMONE: FSH: 45.5 m[IU]/mL

## 2021-05-03 NOTE — Telephone Encounter (Signed)
Scheduled follow-up appointments per 1/18 los. Patient's daughter is aware. °

## 2021-05-04 ENCOUNTER — Other Ambulatory Visit: Payer: Self-pay

## 2021-05-04 ENCOUNTER — Inpatient Hospital Stay: Payer: Medicare HMO

## 2021-05-04 VITALS — BP 176/70 | HR 73 | Temp 98.1°F | Resp 20

## 2021-05-04 DIAGNOSIS — Z5111 Encounter for antineoplastic chemotherapy: Secondary | ICD-10-CM | POA: Diagnosis not present

## 2021-05-04 DIAGNOSIS — Z170 Estrogen receptor positive status [ER+]: Secondary | ICD-10-CM

## 2021-05-04 DIAGNOSIS — C50312 Malignant neoplasm of lower-inner quadrant of left female breast: Secondary | ICD-10-CM

## 2021-05-04 MED ORDER — PEGFILGRASTIM-CBQV 6 MG/0.6ML ~~LOC~~ SOSY
6.0000 mg | PREFILLED_SYRINGE | Freq: Once | SUBCUTANEOUS | Status: AC
  Administered 2021-05-04: 6 mg via SUBCUTANEOUS
  Filled 2021-05-04: qty 0.6

## 2021-05-04 NOTE — Progress Notes (Signed)
Blood pressure is 176/70, Maygan RN was made aware and will make Dr.Feng aware. Pt has taken her hypertension medication today and has no complaints of headaches, dizziness, or blurred vision.

## 2021-05-08 ENCOUNTER — Encounter (HOSPITAL_COMMUNITY): Payer: Self-pay

## 2021-05-08 ENCOUNTER — Emergency Department (HOSPITAL_COMMUNITY)
Admission: EM | Admit: 2021-05-08 | Discharge: 2021-05-08 | Disposition: A | Discharge status: Home / Self Care | Payer: Medicare HMO | Attending: Emergency Medicine | Admitting: Emergency Medicine

## 2021-05-08 ENCOUNTER — Other Ambulatory Visit: Payer: Self-pay

## 2021-05-08 ENCOUNTER — Telehealth: Payer: Self-pay

## 2021-05-08 ENCOUNTER — Other Ambulatory Visit: Payer: Self-pay | Admitting: Hematology

## 2021-05-08 ENCOUNTER — Emergency Department (HOSPITAL_COMMUNITY)
Admission: EM | Admit: 2021-05-08 | Discharge: 2021-05-09 | Disposition: A | Discharge status: Home / Self Care | Payer: Medicare HMO | Source: Home / Self Care | Attending: Emergency Medicine | Admitting: Emergency Medicine

## 2021-05-08 DIAGNOSIS — E871 Hypo-osmolality and hyponatremia: Secondary | ICD-10-CM | POA: Insufficient documentation

## 2021-05-08 DIAGNOSIS — R7309 Other abnormal glucose: Secondary | ICD-10-CM

## 2021-05-08 DIAGNOSIS — M791 Myalgia, unspecified site: Secondary | ICD-10-CM | POA: Insufficient documentation

## 2021-05-08 DIAGNOSIS — Z79899 Other long term (current) drug therapy: Secondary | ICD-10-CM | POA: Insufficient documentation

## 2021-05-08 DIAGNOSIS — E1122 Type 2 diabetes mellitus with diabetic chronic kidney disease: Secondary | ICD-10-CM | POA: Diagnosis not present

## 2021-05-08 DIAGNOSIS — Z992 Dependence on renal dialysis: Secondary | ICD-10-CM | POA: Insufficient documentation

## 2021-05-08 DIAGNOSIS — N186 End stage renal disease: Secondary | ICD-10-CM | POA: Insufficient documentation

## 2021-05-08 DIAGNOSIS — Z853 Personal history of malignant neoplasm of breast: Secondary | ICD-10-CM | POA: Insufficient documentation

## 2021-05-08 DIAGNOSIS — Z7982 Long term (current) use of aspirin: Secondary | ICD-10-CM | POA: Insufficient documentation

## 2021-05-08 DIAGNOSIS — Z794 Long term (current) use of insulin: Secondary | ICD-10-CM | POA: Insufficient documentation

## 2021-05-08 DIAGNOSIS — Z5321 Procedure and treatment not carried out due to patient leaving prior to being seen by health care provider: Secondary | ICD-10-CM | POA: Insufficient documentation

## 2021-05-08 DIAGNOSIS — I120 Hypertensive chronic kidney disease with stage 5 chronic kidney disease or end stage renal disease: Secondary | ICD-10-CM | POA: Insufficient documentation

## 2021-05-08 DIAGNOSIS — R519 Headache, unspecified: Secondary | ICD-10-CM | POA: Insufficient documentation

## 2021-05-08 DIAGNOSIS — Z20822 Contact with and (suspected) exposure to covid-19: Secondary | ICD-10-CM | POA: Insufficient documentation

## 2021-05-08 DIAGNOSIS — R197 Diarrhea, unspecified: Secondary | ICD-10-CM | POA: Diagnosis not present

## 2021-05-08 LAB — URINALYSIS, ROUTINE W REFLEX MICROSCOPIC
Bilirubin Urine: NEGATIVE
Glucose, UA: >=500 mg/dL — AB
Hgb urine dipstick: NEGATIVE
Ketones, ur: NEGATIVE mg/dL
Leukocytes,Ua: NEGATIVE
Nitrite: NEGATIVE
Protein, ur: NEGATIVE mg/dL
Specific Gravity, Urine: 1.025 (ref 1.005–1.030)
pH: 5 (ref 5.0–8.0)

## 2021-05-08 LAB — CBC WITH DIFFERENTIAL/PLATELET
Abs Immature Granulocytes: 0.53 10*3/uL — ABNORMAL HIGH (ref 0.00–0.07)
Basophils Absolute: 0 10*3/uL (ref 0.0–0.1)
Basophils Relative: 0 %
Eosinophils Absolute: 0.1 10*3/uL (ref 0.0–0.5)
Eosinophils Relative: 1 %
HCT: 41.9 % (ref 36.0–46.0)
Hemoglobin: 14.1 g/dL (ref 12.0–15.0)
Immature Granulocytes: 6 %
Lymphocytes Relative: 16 %
Lymphs Abs: 1.5 10*3/uL (ref 0.7–4.0)
MCH: 29.8 pg (ref 26.0–34.0)
MCHC: 33.7 g/dL (ref 30.0–36.0)
MCV: 88.6 fL (ref 80.0–100.0)
Monocytes Absolute: 1.1 10*3/uL — ABNORMAL HIGH (ref 0.1–1.0)
Monocytes Relative: 12 %
Neutro Abs: 6 10*3/uL (ref 1.7–7.7)
Neutrophils Relative %: 65 %
Platelets: 108 10*3/uL — ABNORMAL LOW (ref 150–400)
RBC: 4.73 MIL/uL (ref 3.87–5.11)
RDW: 12.2 % (ref 11.5–15.5)
WBC: 9.1 10*3/uL (ref 4.0–10.5)
nRBC: 0 % (ref 0.0–0.2)

## 2021-05-08 LAB — COMPREHENSIVE METABOLIC PANEL
ALT: 24 U/L (ref 0–44)
AST: 17 U/L (ref 15–41)
Albumin: 3.6 g/dL (ref 3.5–5.0)
Alkaline Phosphatase: 122 U/L (ref 38–126)
Anion gap: 8 (ref 5–15)
BUN: 35 mg/dL — ABNORMAL HIGH (ref 6–20)
CO2: 24 mmol/L (ref 22–32)
Calcium: 9.7 mg/dL (ref 8.9–10.3)
Chloride: 97 mmol/L — ABNORMAL LOW (ref 98–111)
Creatinine, Ser: 1.24 mg/dL — ABNORMAL HIGH (ref 0.44–1.00)
GFR, Estimated: 52 mL/min — ABNORMAL LOW (ref >60)
Glucose, Bld: 397 mg/dL — ABNORMAL HIGH (ref 70–99)
Potassium: 4.9 mmol/L (ref 3.5–5.1)
Sodium: 129 mmol/L — ABNORMAL LOW (ref 135–145)
Total Bilirubin: 0.7 mg/dL (ref 0.3–1.2)
Total Protein: 6 g/dL — ABNORMAL LOW (ref 6.5–8.1)

## 2021-05-08 LAB — CK: Total CK: 21 U/L — ABNORMAL LOW (ref 38–234)

## 2021-05-08 MED ORDER — OXYCODONE HCL 5 MG PO TABS: 5.0000 mg | ORAL_TABLET | Freq: Four times a day (QID) | ORAL | 0 refills | Status: DC | PRN

## 2021-05-08 NOTE — ED Notes (Signed)
Patient is currently receiving Chemo.

## 2021-05-08 NOTE — ED Provider Notes (Signed)
Emergency Medicine Provider Triage Evaluation Note  Cynthia [name] , a 50 y.o. female  was evaluated in triage.  Pt complains of generalized body aches onset this morning.  Diarrhea x3 days.  Patient started chemotherapy Friday.  Review of Systems  Positive: Diarrhea, generalized body aches Negative: Chest pain, cough, shortness of breath  Physical Exam  BP (!) 154/69 (BP Location: Right Arm)   Pulse 75   Temp 98.1 F (36.7 C) (Oral)   Resp (!) 21   Ht 5' (1.524 m)   Wt 97.5 kg   SpO2 96%   BMI 41.99 kg/m  Gen:   Awake, no distress   Resp:  Normal effort  MSK:   Moves extremities without difficulty  Other:    Medical Decision Making  Medically screening exam initiated at 2:04 PM.  Appropriate orders placed.  Cynthia [name] was informed that the remainder of the evaluation will be completed by another provider, this initial triage assessment does not replace that evaluation, and the importance of remaining in the ED until their evaluation is complete.     Cynthia Courier, PA-C 05/08/21 1409    Cynthia Saver, MD 05/08/21 [phone]

## 2021-05-08 NOTE — ED Triage Notes (Addendum)
Pt reports with pain all over her body since this morning 10/10 pain.

## 2021-05-08 NOTE — ED Triage Notes (Signed)
Pt arrived via POV, c/o pain throughout entire body and diarrhea that started today. Denies any other known sick contacts

## 2021-05-08 NOTE — ED Provider Notes (Signed)
[facility] DEPT Provider Note   CSN: [id number] Arrival date & time: 05/08/21  2123     History Chief Complaint  Patient presents with   Generalized Body Aches    Cynthia [name] is a 50 y.o. female.  The history is provided by the patient.  She has history of hypertension, diabetes, hyperlipidemia, renal transplant, breast cancer and comes in because of diarrhea for 4 days, headache and generalized body aches today.  She received her first dose of docetaxel on 11/8, [id number]-[id number] on 11/10.  Diarrhea started shortly after that and she has been having 5-6 bowel movements a day.  She started having chills and body aches this morning and also had a headache.  She denies nausea or vomiting.  She has not had a fever.  She took acetaminophen without relief, took 4 oxycodone tablets which did give her relief.  She denies any sick contacts.  She denies cough or dyspnea.   Past Medical History:  Diagnosis Date   Allergy    pollen   Anemia    when on dialysis   Anxiety    BRCA2 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 03/01/2021   Breast cancer [facility])    Cataract    surgical repair bilateral   Depression    ESRD on hemodialysis ([place])    Home HD 5x per week- not on dialysis now had tramsplant 4/17   Family history of ovarian cancer 02/22/2021   GERD (gastroesophageal reflux disease)    Hearing loss 2017   right ear   History of blood transfusion    transfusion reaction   Hyperlipidemia    Hypertension    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 per pt-dr levy also II   Kidney transplant recipient    Neuromuscular disorder ([place])    NEUROPATHY   Sleep apnea     Patient Active Problem List   Diagnosis Date Noted   Aortic atherosclerosis ([place]) 03/07/2021   Hypercholesterolemia 03/07/2021   BRCA2 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 03/01/2021   Genetic testing 03/01/2021   Family history of ovarian  cancer 02/22/2021   Malignant neoplasm of lower-inner quadrant of left breast in female, estrogen receptor positive ([place]) 02/15/2021   Foot pain, bilateral 07/28/2020   COVID-19 virus infection 06/16/2019   Insulin dependent type 2 diabetes mellitus ([place]) 06/06/2019   Hyperglycemia 06/02/2019   DKA (diabetic ketoacidoses) 06/02/2019   HTN (hypertension) 03/04/2019   Abnormal nuclear stress test 03/04/2019   Diabetic nephropathy ([place]) 03/03/2019   History of renal transplant 11/18/2015   Diabetic retinopathy associated with type 2 diabetes mellitus ([place]) 04/13/2015   Postprandial vomiting    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   Anemia in CKD (chronic kidney disease) 01/04/2015   Pseudoaneurysm of arteriovenous dialysis fistula ([place]) 03/16/2014   Hyperkalemia 11/16/2013   Chronic constipation 07/28/2013   Paresthesias 07/28/2013   Diabetes mellitus type I ([place])    Kidney transplant status 05/28/2013   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/11/2013   Precordial chest pain 02/10/2013   Awaiting organ transplant 09/19/2012    Past Surgical History:  Procedure Laterality Date   ABDOMINAL HYSTERECTOMY     BREAST EXCISIONAL BIOPSY Right 08/2015   BREAST LUMPECTOMY WITH RADIOACTIVE SEED LOCALIZATION Right 09/14/2015   Procedure: RIGHT BREAST LUMPECTOMY WITH RADIOACTIVE SEED LOCALIZATION;  Surgeon: Donnie Mesa, MD;  Location: [facility];  Service: General;  Laterality: Right;   BREAST LUMPECTOMY WITH RADIOACTIVE SEED LOCALIZATION Left 03/02/2021   Procedure:  LEFT BREAST SEED LUMPECTOMY LEFT SENTINEL LYMPH NODE MAPPING;  Surgeon: Erroll Luna, MD;  Location: [facility];  Service: General;  Laterality: Left;  GEN AND PEC BLOCK   BREAST SURGERY Bilateral    biopsy bilateral   CATARACT EXTRACTION Bilateral    bilateral   CHOLECYSTECTOMY     CYST REMOVAL NECK     DIALYSIS FISTULA CREATION Left    EYE SURGERY Bilateral    lazer   LEFT HEART CATH AND CORONARY ANGIOGRAPHY N/A  03/04/2019   Procedure: LEFT HEART CATH AND CORONARY ANGIOGRAPHY;  Surgeon: Martinique, Peter M, MD;  Location: [facility] CV LAB;  Service: Cardiovascular;  Laterality: N/A;   LEFT HEART CATHETERIZATION WITH CORONARY ANGIOGRAM N/A 03/30/2014   Procedure: LEFT HEART CATHETERIZATION WITH CORONARY ANGIOGRAM;  Surgeon: Sinclair Grooms, MD;  Location: [facility] CATH LAB;  Service: Cardiovascular;  Laterality: N/A;   LIPOMA EXCISION N/A 02/06/2017   Procedure: EXCISION POSTERIOR NECK SEBACEOUS CYST;  Surgeon: Coralie Keens, MD;  Location: [facility];  Service: General;  Laterality: N/A;   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   Procedure: REPAIR OF ARTERIOVENOUS FISTULA ANEURYSM;  Surgeon: Serafina Mitchell, MD;  Location: [facility] OR;  Service: Vascular;  Laterality: Left;   REVISON OF ARTERIOVENOUS FISTULA Left 09/28/2013   Procedure: EXCISE ESCHAR LEFT ARM  ARTERIOVENOUS FISTULA WITH PLICATION OF LEFT ARM ARTERIOVENOUS FISTULA;  Surgeon: Elam Dutch, MD;  Location: [facility];  Service: Vascular;  Laterality: Left;   REVISON OF ARTERIOVENOUS FISTULA Left 08/26/2015   Procedure: RESECTION ANEURYSM OF LEFT ARM ARTERIOVENOUS FISTULA  ;  Surgeon: Serafina Mitchell, MD;  Location: [facility] OR;  Service: Vascular;  Laterality: Left;   TUBAL LIGATION       OB History   No obstetric history on file.     Family History  Problem Relation Age of Onset   Diabetes Mother    Kidney disease Mother    Diabetes Father    Heart disease Father    Kidney disease Father    Hypertension Father    Diabetes Sister    Asthma Sister    Lupus Sister    Diabetes Brother    Diabetes Brother    Diabetes Brother    Diabetes Brother    Cancer Maternal Grandmother 48       ovarian cancer   Diabetes Maternal Grandfather    Diabetes Paternal Grandmother    Colon cancer Neg Hx    Colon polyps Neg Hx    Esophageal cancer Neg Hx    Rectal cancer Neg Hx    Stomach cancer Neg Hx     Social History   Tobacco Use   Smoking  status: Never   Smokeless tobacco: Never  Vaping Use   Vaping Use: Never used  Substance Use Topics   Alcohol use: Yes    Comment: Socially   Drug use: No    Home Medications Prior to Admission medications   Medication Sig Start Date End Date Taking? Authorizing Provider  Accu-Chek Softclix Lancets lancets Use as instructed 08/03/20   Renato Shin, MD  aspirin EC 81 MG EC tablet Take 1 tablet (81 mg total) by mouth daily. Patient taking differently: Take 81 mg by mouth at bedtime. 03/31/14   Katheren Shams, DO  B Complex-C (SUPER B COMPLEX PO) Take 1 tablet by mouth daily.     [provider]  Blood Glucose Monitoring Suppl (ACCU-CHEK AVIVA CONNECT) w/Device KIT Check sugar 1x  daily 08/03/20   Renato Shin, MD  carvedilol (COREG) 25 MG tablet Take 12.5 mg by mouth 2 (two) times daily with a meal.  11/23/15   [provider]  cetirizine (ZYRTEC) 10 MG tablet Take 5 mg by mouth daily. Takes 0.5 tablet daily    [provider]  Cholecalciferol (VITAMIN D3) 1000 units CAPS Take 1,000 Units by mouth daily.  02/16/16   [provider]  dexamethasone (DECADRON) 4 MG tablet Take 2 tablets (8 mg total) by mouth 2 (two) times daily. Start the day before Taxotere. Then again the day after chemo for 3 days. 05/02/21   Truitt Merle, MD  fludrocortisone (FLORINEF) 0.1 MG tablet Take 0.1 mg by mouth daily.    [provider]  FLUoxetine (PROZAC) 40 MG capsule Take 40 mg by mouth daily.    [provider]  furosemide (LASIX) 40 MG tablet Take 40 mg by mouth 2 (two) times daily. 12/07/20   [provider]  gabapentin (NEURONTIN) 300 MG capsule Take 300 mg by mouth at bedtime. 11/16/14   [provider]  glucose blood (ACCU-CHEK AVIVA PLUS) test strip Check sugar 1x daily 08/03/20   Renato Shin, MD  ibuprofen (ADVIL) 800 MG tablet Take 1 tablet (800 mg total) by mouth every 8 (eight) hours as needed. 03/02/21   Cornett, Marcello Moores, MD  insulin  glargine (LANTUS SOLOSTAR) 100 UNIT/ML Solostar Pen Inject 65 Units into the skin every morning. 11/03/20   Renato Shin, MD  Insulin Pen Needle (PEN NEEDLES) 30G X 8 MM MISC 1 each by Does not apply route daily. E11.9 09/01/19   Renato Shin, MD  magnesium oxide (MAG-OX) 400 MG tablet Take 400 mg by mouth 2 (two) times daily.  12/09/15   [provider]  mycophenolate (MYFORTIC) 180 MG EC tablet Take 360 mg by mouth 2 (two) times daily.    [provider]  ondansetron (ZOFRAN) 8 MG tablet Take 1 tablet (8 mg total) by mouth 2 (two) times daily as needed for refractory nausea / vomiting. Start on day 3 after chemo. 05/02/21   Truitt Merle, MD  oxyCODONE (OXY IR/ROXICODONE) 5 MG immediate release tablet Take 1 tablet (5 mg total) by mouth every 6 (six) hours as needed for severe pain. 05/08/21   Truitt Merle, MD  pantoprazole (PROTONIX) 40 MG tablet Take 40 mg by mouth daily. 08/02/15   [provider]  predniSONE (DELTASONE) 5 MG tablet Take 5 mg by mouth daily.  01/04/17   [provider]  prochlorperazine (COMPAZINE) 10 MG tablet Take 1 tablet (10 mg total) by mouth every 6 (six) hours as needed (Nausea or vomiting). 05/02/21   Truitt Merle, MD  rosuvastatin (CRESTOR) 5 MG tablet Take 5 mg by mouth at bedtime.    [provider]  Tacrolimus ER (ENVARSUS XR) 1 MG TB24 Take 3 mg by mouth every morning.    [provider]  vitamin E 400 UNIT capsule Take 400 Units by mouth daily.    [provider]    Allergies    Other, Norvasc [amlodipine], and Tape  Review of Systems   Review of Systems  All other systems reviewed and are negative.  Physical Exam Updated Vital Signs BP (!) 190/77   Pulse 73   Temp 98 F (36.7 C) (Oral)   Resp 20   SpO2 96%   Physical Exam Vitals and nursing note reviewed.  50 year old female, resting comfortably and in no acute distress. Vital  signs are significant for elevated blood pressure. Oxygen saturation is 95%,  which is normal. Head is normocephalic and atraumatic. PERRLA, EOMI. Oropharynx is clear. Neck is nontender and supple without adenopathy or JVD. Back is nontender and there is no CVA tenderness. Lungs are clear without rales, wheezes, or rhonchi. Chest is nontender. Heart has regular rate and rhythm without murmur. Abdomen is soft, flat, nontender without masses or hepatosplenomegaly and peristalsis is hypoactive. Extremities have no cyanosis or edema, full range of motion is present. Skin is warm and dry without rash. Neurologic: Mental status is normal, cranial nerves are intact, moves all extremities equally.  ED Results / Procedures / Treatments   Labs (all labs ordered are listed, but only abnormal results are displayed) Labs Reviewed  CBC WITH DIFFERENTIAL/PLATELET - Abnormal; Notable for the following components:      Result Value   Platelets 108 (*)    Monocytes Absolute 1.1 (*)    Abs Immature Granulocytes 0.53 (*)    All other components within normal limits  COMPREHENSIVE METABOLIC PANEL - Abnormal; Notable for the following components:   Sodium 129 (*)    Chloride 97 (*)    Glucose, Bld 397 (*)    BUN 35 (*)    Creatinine, Ser 1.24 (*)    Total Protein 6.0 (*)    GFR, Estimated 52 (*)    All other components within normal limits  URINALYSIS, ROUTINE W REFLEX MICROSCOPIC - Abnormal; Notable for the following components:   Glucose, UA >=500 (*)    Bacteria, UA RARE (*)    All other components within normal limits  CK - Abnormal; Notable for the following components:   Total CK 21 (*)    All other components within normal limits  RESP PANEL BY RT-PCR (FLU A&B, COVID) [id number]   Procedures Procedures   Medications Ordered in ED Medications  loperamide (IMODIUM) capsule 4 mg (4 mg Oral Given 05/09/21 0022)    ED Course  I have reviewed the triage vital signs and the nursing notes.  Pertinent lab results that were available during my care of the patient were  reviewed by me and considered in my medical decision making (see chart for details).   MDM Rules/Calculators/A&P                         Diarrhea, possible side effect of docetaxel.  Chills and body aches suggestive of influenza-like illness.  Old records reviewed confirming recent initiation of chemotherapy.  Diarrhea is a known side effect of the Paxil.  Labs today show stable renal insufficiency, glucose elevated but patient states that she had missed a dose of insulin.  Mild hyponatremia is present which is felt to be related to the degree of hyperglycemia.  Respiratory pathogen panel is negative for influenza and COVID-19.  Patient is advised that the some of the symptoms are likely related to her chemotherapy.  She is given a dose of loperamide and sent home with prescription for same, told to use over-the-counter acetaminophen as needed for control of pain.  Recommended that if she does use oxycodone, limit dose to 5 mg.  Final Clinical Impression(s) / ED Diagnoses Final diagnoses:  Diarrhea, unspecified type  Elevated random blood glucose level  Myalgia    Rx / DC Orders ED Discharge Orders          Ordered    loperamide (IMODIUM) 2 MG capsule  4 times daily PRN  05/09/21 5462             Delora Fuel, MD 70/35/00 [phone]

## 2021-05-08 NOTE — Telephone Encounter (Signed)
Pt's spouse called stating that pt is in severe pain.  Pt taking Tylenol 1000mg  (2 each 500mg  tabs) as directed on bottle for pain with NO relief.  Pt has order for Oxycodone but has las ordered back in September 2022.  Would you refill the Oxycodone and give the patient a call?

## 2021-05-08 NOTE — ED Notes (Signed)
Pt eloped. Pt did not wait to speak to RN or MD, pt told registration she was leaving.

## 2021-05-08 NOTE — ED Notes (Signed)
Patient reports pain in her back hip and head. Patient felt weakness in her leg. Patient took 20mg  of oxycodone before coming here. Patient says her pain has decreased since taking the medicine. Patient had medication left over from her breast cancer treatment and breast surgery. Patient had a kidney transplant 5 years ago and has a fistula in the left arm. Patient has had diarrhea for 3 days. Patient takes blood pressure medication and aspirin.

## 2021-05-09 ENCOUNTER — Ambulatory Visit (HOSPITAL_BASED_OUTPATIENT_CLINIC_OR_DEPARTMENT_OTHER): Payer: Medicare HMO | Admitting: Cardiology

## 2021-05-09 ENCOUNTER — Encounter: Payer: Self-pay | Admitting: *Deleted

## 2021-05-09 ENCOUNTER — Inpatient Hospital Stay (HOSPITAL_BASED_OUTPATIENT_CLINIC_OR_DEPARTMENT_OTHER): Payer: Medicare HMO | Admitting: Physician Assistant

## 2021-05-09 ENCOUNTER — Inpatient Hospital Stay: Payer: Medicare HMO

## 2021-05-09 VITALS — BP 121/47 | HR 71 | Temp 98.2°F | Resp 16

## 2021-05-09 DIAGNOSIS — R112 Nausea with vomiting, unspecified: Secondary | ICD-10-CM

## 2021-05-09 DIAGNOSIS — Z170 Estrogen receptor positive status [ER+]: Secondary | ICD-10-CM | POA: Diagnosis not present

## 2021-05-09 DIAGNOSIS — M791 Myalgia, unspecified site: Secondary | ICD-10-CM | POA: Diagnosis not present

## 2021-05-09 DIAGNOSIS — C50312 Malignant neoplasm of lower-inner quadrant of left female breast: Secondary | ICD-10-CM | POA: Diagnosis not present

## 2021-05-09 DIAGNOSIS — R638 Other symptoms and signs concerning food and fluid intake: Secondary | ICD-10-CM | POA: Diagnosis not present

## 2021-05-09 DIAGNOSIS — Z5111 Encounter for antineoplastic chemotherapy: Secondary | ICD-10-CM | POA: Diagnosis not present

## 2021-05-09 DIAGNOSIS — R197 Diarrhea, unspecified: Secondary | ICD-10-CM

## 2021-05-09 LAB — C DIFFICILE QUICK SCREEN W PCR REFLEX
C Diff antigen: NEGATIVE
C Diff interpretation: NOT DETECTED
C Diff toxin: NEGATIVE

## 2021-05-09 LAB — RESP PANEL BY RT-PCR (FLU A&B, COVID) ARPGX2
Influenza A by PCR: NEGATIVE
Influenza B by PCR: NEGATIVE
SARS Coronavirus 2 by RT PCR: NEGATIVE

## 2021-05-09 MED ORDER — ONDANSETRON HCL 4 MG/2ML IJ SOLN
4.0000 mg | Freq: Once | INTRAMUSCULAR | Status: AC
  Administered 2021-05-09: 4 mg via INTRAVENOUS
  Filled 2021-05-09: qty 2

## 2021-05-09 MED ORDER — MORPHINE SULFATE (PF) 4 MG/ML IV SOLN
4.0000 mg | Freq: Once | INTRAVENOUS | Status: AC
  Administered 2021-05-09: 4 mg via INTRAVENOUS
  Filled 2021-05-09: qty 1

## 2021-05-09 MED ORDER — LOPERAMIDE HCL 2 MG PO CAPS: 2.0000 mg | ORAL_CAPSULE | Freq: Four times a day (QID) | ORAL | 0 refills | Status: DC | PRN

## 2021-05-09 MED ORDER — LOPERAMIDE HCL 2 MG PO CAPS
4.0000 mg | ORAL_CAPSULE | Freq: Once | ORAL | Status: AC
  Administered 2021-05-09: 4 mg via ORAL
  Filled 2021-05-09: qty 2

## 2021-05-09 MED ORDER — SODIUM CHLORIDE 0.9 % IV SOLN: Freq: Once | INTRAVENOUS | Status: AC

## 2021-05-09 NOTE — Progress Notes (Signed)
Symptom Management Consult note [place]    Patient Care Team: Arthur Holms, NP as PCP - General (Nurse Practitioner) Buford Dresser, MD as PCP - Cardiology (Cardiology) Rexene Agent, MD as Consulting Physician (Nephrology) Dwana Melena, MD as Referring Physician (Nephrology) Rockwell Germany, RN as Oncology Nurse Navigator Mauro Kaufmann, RN as Oncology Nurse Navigator Truitt Merle, MD as Consulting Physician (Hematology) Erroll Luna, MD as Consulting Physician (General Surgery) Eppie Gibson, MD as Attending Physician (Radiation Oncology)    Name of the patient: Cynthia [name]  [id number]  1971/02/21   Date of visit: 05/09/2021    Chief complaint/ Reason for visit- N/V,diarrhea and body aches   Oncology History Overview Note  Cancer Staging Malignant neoplasm of lower-inner quadrant of left breast in female, estrogen receptor positive ([place]) Staging form: Breast, AJCC 8th Edition - Clinical stage from 02/09/2021: Stage IA (cT1c, cN0, cM0, G2, ER+, PR+, HER2-) - Signed by Truitt Merle, MD on 02/20/2021 Stage prefix: Initial diagnosis Histologic grading system: 3 grade system - Pathologic stage from 03/02/2021: Stage IA (pT2, pN0, cM0, G2, ER+, PR+, HER2-, Oncotype DX score: 26) - Signed by Truitt Merle, MD on 03/21/2021 Stage prefix: Initial diagnosis Multigene prognostic tests performed: Oncotype DX Recurrence score range: Greater than or equal to 11 Histologic grading system: 3 grade system Residual tumor (R): R0 - None    Malignant neoplasm of lower-inner quadrant of left breast in female, estrogen receptor positive ([place])  02/07/2021 Mammogram   Exam: Left Diagnostic Mammogram; Left Breast Ultrasound  IMPRESSION: Irregular mass in left breast at 6:30, measuring 1.6 cm by mammogram, is highly suggestive of malignancy. Left axillary ultrasound is negative for lymphadenopathy.   02/09/2021 Cancer Staging   Staging form: Breast, AJCC 8th Edition -  Clinical stage from 02/09/2021: Stage IA (cT1c, cN0, cM0, G2, ER+, PR+, HER2-) - Signed by Truitt Merle, MD on 02/20/2021 Stage prefix: Initial diagnosis Histologic grading system: 3 grade system    02/09/2021 Pathology Results   Diagnosis Breast, left, needle core biopsy, 6:30 o'clock 8cm fn - INVASIVE DUCTAL CARCINOMA WITH HISTIOCYTOID FEATURES. SEE NOTE Diagnosis Note Carcinoma measures 1 cm in greatest linear dimension and appears grade 2.  PROGNOSTIC INDICATORS Results: IMMUNOHISTOCHEMICAL AND MORPHOMETRIC ANALYSIS PERFORMED MANUALLY The tumor cells are EQUIVOCAL for Her2 (2+). Her2 by FISH will be performed and results performed separately. Estrogen Receptor: >95%, POSITIVE, STRONG STAINING INTENSITY Progesterone Receptor: 90%, POSITIVE, STRONG STAINING INTENSITY Proliferation Marker Ki67: 20%  FLUORESCENCE IN-SITU HYBRIDIZATION Results: GROUP 5: HER2 **NEGATIVE** Equivocal form of amplification of the HER2 gene was detected in the IHC 2+ tissue sample received from this individual. HER2 FISH was performed by a technologist and cell imaging and analysis on the [employer]. RATIO OF HER2/CEN17 SIGNALS 1.30 AVERAGE HER2 COPY NUMBER PER CELL 1.75   02/15/2021 Initial Diagnosis   Malignant neoplasm of lower-inner quadrant of left breast in female, estrogen receptor positive ([place])   03/01/2021 Genetic Testing   Positive genetic testing: pathogenic variant detected in BRCA2 at [id number].  No other pathogenic variants detected in [facility] CustomNext Panel.  The report date is 03/19/2021.  The CustomNext-Cancer+RNAinsight panel offered by [facility] includes sequencing and rearrangement analysis for the following 47 genes:  APC, ATM, AXIN2, BARD1, BMPR1A, BRCA1, BRCA2, BRIP1, CDH1, CDK4, CDKN2A, CHEK2, DICER1, EPCAM, GREM1, HOXB13, MEN1, MLH1, MSH2, MSH3, MSH6, MUTYH, NBN, NF1, NF2, NTHL1, PALB2, PMS2, POLD1, POLE, PTEN, RAD51C, RAD51D, RECQL, RET, SDHA, SDHAF2, SDHB, SDHC, SDHD, SMAD4,  SMARCA4, STK11, TP53,  TSC1, TSC2, and VHL.  RNA data is routinely analyzed for use in variant interpretation for all genes.   03/02/2021 Cancer Staging   Staging form: Breast, AJCC 8th Edition - Pathologic stage from 03/02/2021: Stage IA (pT2, pN0, cM0, G2, ER+, PR+, HER2-, Oncotype DX score: 26) - Signed by Truitt Merle, MD on 03/21/2021 Stage prefix: Initial diagnosis Multigene prognostic tests performed: Oncotype DX Recurrence score range: Greater than or equal to 11 Histologic grading system: 3 grade system Residual tumor (R): R0 - None    05/02/2021 -  Chemotherapy   Patient is on Treatment Plan : BREAST TC q21d       Current Therapy: first dose of docetaxel 11/8, pegfilgrastim-cbqv on 11/10  Interval history-  Cynthia [name] is a 50 yo female with history as stated above presenting to [facility] today with chief complaint of nausea, vomiting, diarrhea, generalized pain. Patient is accompanied by her daughter who translates for her. Patient seen in ED last night generalized body aches and diarrhea. Labs showed stable renal insufficiency, elevated glucose likely after missing insulin dose and hyponatremia again thought to be related to hyperglycemia. Covid and flu tests yesterday were negative. She was prescribed imodium, did not yet take it. She reports today she continued to feel nauseas with generalized weakness. She had 2 episodes of non bloody non bilious emesis. She has been unable to tolerate PO intake at home including home medications. She did take zofran this morning. She has had 5 episode of loose liquid stool. Denies seeing any blood in stool, or stool being black in color. Has not had fever. No sick contacts or known covid exposures. Patient admits to abdominal pain she describes as cramping. Pain is intermittent and she feels it before bowel movements. She rates pain 3/10 in severity. Pain dose not radiate. She denies fever, chills, cough, congestion, chest pain, shortness of breath, urinary  symptoms, rash. No recent travel or antibiotic use.      ROS  All other systems are reviewed and are negative for acute change except as noted in the HPI.    Allergies  Allergen Reactions   Other Rash    Burns skin.  Please use paper tape only Burns skin.  Please use paper tape only   Norvasc [Amlodipine] Swelling   Tape Other (See Comments)    Burns skin.  Please use paper tape only     Past Medical History:  Diagnosis Date   Allergy    pollen   Anemia    when on dialysis   Anxiety    BRCA2 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 03/01/2021   Breast cancer [facility])    Cataract    surgical repair bilateral   Depression    ESRD on hemodialysis ([place])    Home HD 5x per week- not on dialysis now had tramsplant 4/17   Family history of ovarian cancer 02/22/2021   GERD (gastroesophageal reflux disease)    Hearing loss 2017   right ear   History of blood transfusion    transfusion reaction   Hyperlipidemia    Hypertension    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 per pt-dr levy also II   Kidney transplant recipient    Neuromuscular disorder ([place])    NEUROPATHY   Sleep apnea      Past Surgical History:  Procedure Laterality Date   ABDOMINAL HYSTERECTOMY     BREAST EXCISIONAL BIOPSY Right 08/2015   BREAST LUMPECTOMY WITH  RADIOACTIVE SEED LOCALIZATION Right 09/14/2015   Procedure: RIGHT BREAST LUMPECTOMY WITH RADIOACTIVE SEED LOCALIZATION;  Surgeon: Manus Rudd, MD;  Location: [facility];  Service: General;  Laterality: Right;   BREAST LUMPECTOMY WITH RADIOACTIVE SEED LOCALIZATION Left 03/02/2021   Procedure: LEFT BREAST SEED LUMPECTOMY LEFT SENTINEL LYMPH NODE MAPPING;  Surgeon: Harriette Bouillon, MD;  Location: [facility];  Service: General;  Laterality: Left;  GEN AND PEC BLOCK   BREAST SURGERY Bilateral    biopsy bilateral   CATARACT EXTRACTION Bilateral    bilateral   CHOLECYSTECTOMY      CYST REMOVAL NECK     DIALYSIS FISTULA CREATION Left    EYE SURGERY Bilateral    lazer   LEFT HEART CATH AND CORONARY ANGIOGRAPHY N/A 03/04/2019   Procedure: LEFT HEART CATH AND CORONARY ANGIOGRAPHY;  Surgeon: Swaziland, Peter M, MD;  Location: [facility] INVASIVE CV LAB;  Service: Cardiovascular;  Laterality: N/A;   LEFT HEART CATHETERIZATION WITH CORONARY ANGIOGRAM N/A 03/30/2014   Procedure: LEFT HEART CATHETERIZATION WITH CORONARY ANGIOGRAM;  Surgeon: Lesleigh Noe, MD;  Location: [facility] CATH LAB;  Service: Cardiovascular;  Laterality: N/A;   LIPOMA EXCISION N/A 02/06/2017   Procedure: EXCISION POSTERIOR NECK SEBACEOUS CYST;  Surgeon: Abigail Miyamoto, MD;  Location: [facility] OR;  Service: General;  Laterality: N/A;   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   Procedure: REPAIR OF ARTERIOVENOUS FISTULA ANEURYSM;  Surgeon: Nada Libman, MD;  Location: [facility] OR;  Service: Vascular;  Laterality: Left;   REVISON OF ARTERIOVENOUS FISTULA Left 09/28/2013   Procedure: EXCISE ESCHAR LEFT ARM  ARTERIOVENOUS FISTULA WITH PLICATION OF LEFT ARM ARTERIOVENOUS FISTULA;  Surgeon: Sherren Kerns, MD;  [facility];  Service: Vascular;  Laterality: Left;   REVISON OF ARTERIOVENOUS FISTULA Left 08/26/2015   Procedure: RESECTION ANEURYSM OF LEFT ARM ARTERIOVENOUS FISTULA  ;  Surgeon: Nada Libman, MD;  Location: [facility] OR;  Service: Vascular;  Laterality: Left;   TUBAL LIGATION      Social History   Socioeconomic History   Marital status: Legally Separated    Spouse name: Not on file   Number of children: 3   Years of education: 9   Highest education level: Not on file  Occupational History   Occupation: disabled  Tobacco Use   Smoking status: Never   Smokeless tobacco: Never  Vaping Use   Vaping Use: Never used  Substance and Sexual Activity   Alcohol use: Yes    Comment: Socially   Drug use: No   Sexual activity: Yes    Birth control/protection: None  Other Topics Concern   Not on file   Social History Narrative   Lives in home with daughters, grand daughter, son-in-law   Caffeine use - 1 cup coffee sometimes    Social Determinants of Health   Financial Resource Strain: High Risk   Difficulty of Paying Living Expenses: Hard  Food Insecurity: Food Insecurity Present   Worried About Radiation protection practitioner of Food in the Last Year: Sometimes true   Ran Out of Food in the Last Year: Sometimes true  Transportation Needs: No Transportation Needs   Lack of Transportation (Medical): No   Lack of Transportation (Non-Medical): No  Physical Activity: Not on file  Stress: Not on file  Social Connections: Not on file  Intimate Partner Violence: Not on file    Family History  Problem Relation Age of Onset   Diabetes Mother    Kidney disease Mother  Diabetes Father    Heart disease Father    Kidney disease Father    Hypertension Father    Diabetes Sister    Asthma Sister    Lupus Sister    Diabetes Brother    Diabetes Brother    Diabetes Brother    Diabetes Brother    Cancer Maternal Grandmother 15       ovarian cancer   Diabetes Maternal Grandfather    Diabetes Paternal Grandmother    Colon cancer Neg Hx    Colon polyps Neg Hx    Esophageal cancer Neg Hx    Rectal cancer Neg Hx    Stomach cancer Neg Hx      Current Outpatient Medications:    Accu-Chek Softclix Lancets lancets, Use as instructed, Disp: 100 each, Rfl: 12   aspirin EC 81 MG EC tablet, Take 1 tablet (81 mg total) by mouth daily. (Patient taking differently: Take 81 mg by mouth at bedtime.), Disp: 90 tablet, Rfl: 3   B Complex-C (SUPER B COMPLEX PO), Take 1 tablet by mouth daily. , Disp: , Rfl:    Blood Glucose Monitoring Suppl (ACCU-CHEK AVIVA CONNECT) w/Device KIT, Check sugar 1x daily, Disp: 1 kit, Rfl: 0   carvedilol (COREG) 25 MG tablet, Take 12.5 mg by mouth 2 (two) times daily with a meal. , Disp: , Rfl:    cetirizine (ZYRTEC) 10 MG tablet, Take 5 mg by mouth daily. Takes 0.5 tablet daily, Disp: ,  Rfl:    Cholecalciferol (VITAMIN D3) 1000 units CAPS, Take 1,000 Units by mouth daily. , Disp: , Rfl:    dexamethasone (DECADRON) 4 MG tablet, Take 2 tablets (8 mg total) by mouth 2 (two) times daily. Start the day before Taxotere. Then again the day after chemo for 3 days., Disp: 30 tablet, Rfl: 1   fludrocortisone (FLORINEF) 0.1 MG tablet, Take 0.1 mg by mouth daily., Disp: , Rfl:    FLUoxetine (PROZAC) 40 MG capsule, Take 40 mg by mouth daily., Disp: , Rfl:    furosemide (LASIX) 40 MG tablet, Take 40 mg by mouth 2 (two) times daily., Disp: , Rfl:    gabapentin (NEURONTIN) 300 MG capsule, Take 300 mg by mouth at bedtime., Disp: , Rfl: 12   glucose blood (ACCU-CHEK AVIVA PLUS) test strip, Check sugar 1x daily, Disp: 100 each, Rfl: 12   ibuprofen (ADVIL) 800 MG tablet, Take 1 tablet (800 mg total) by mouth every 8 (eight) hours as needed., Disp: 30 tablet, Rfl: 0   insulin glargine (LANTUS SOLOSTAR) 100 UNIT/ML Solostar Pen, Inject 65 Units into the skin every morning., Disp: 75 mL, Rfl: PRN   Insulin Pen Needle (PEN NEEDLES) 30G X 8 MM MISC, 1 each by Does not apply route daily. E11.9, Disp: 90 each, Rfl: 0   loperamide (IMODIUM) 2 MG capsule, Take 1 capsule (2 mg total) by mouth 4 (four) times daily as needed for diarrhea or loose stools., Disp: 12 capsule, Rfl: 0   magnesium oxide (MAG-OX) 400 MG tablet, Take 400 mg by mouth 2 (two) times daily. , Disp: , Rfl:    mycophenolate (MYFORTIC) 180 MG EC tablet, Take 360 mg by mouth 2 (two) times daily., Disp: , Rfl:    ondansetron (ZOFRAN) 8 MG tablet, Take 1 tablet (8 mg total) by mouth 2 (two) times daily as needed for refractory nausea / vomiting. Start on day 3 after chemo., Disp: 30 tablet, Rfl: 1   oxyCODONE (OXY IR/ROXICODONE) 5 MG immediate release tablet, Take 1  tablet (5 mg total) by mouth every 6 (six) hours as needed for severe pain., Disp: 15 tablet, Rfl: 0   pantoprazole (PROTONIX) 40 MG tablet, Take 40 mg by mouth daily., Disp: , Rfl: 5    predniSONE (DELTASONE) 5 MG tablet, Take 5 mg by mouth daily. , Disp: , Rfl: 2   prochlorperazine (COMPAZINE) 10 MG tablet, Take 1 tablet (10 mg total) by mouth every 6 (six) hours as needed (Nausea or vomiting)., Disp: 30 tablet, Rfl: 1   rosuvastatin (CRESTOR) 5 MG tablet, Take 5 mg by mouth at bedtime., Disp: , Rfl:    Tacrolimus ER (ENVARSUS XR) 1 MG TB24, Take 3 mg by mouth every morning., Disp: , Rfl:    vitamin E 400 UNIT capsule, Take 400 Units by mouth daily., Disp: , Rfl:   PHYSICAL EXAM: ECOG FS:1 - Symptomatic but completely ambulatory    Vitals:   05/09/21 1621  BP: (!) 116/56  Pulse: 85  Resp: 18  Temp: 98.2 F (36.8 C)  TempSrc: Oral  SpO2: 97%   Physical Exam Vitals and nursing note reviewed.  Constitutional:      General: She is not in acute distress.    Appearance: She is not ill-appearing.     Comments: Uncomfortable appearing female   HENT:     Head: Normocephalic and atraumatic.     Right Ear: Tympanic membrane and external ear normal.     Left Ear: Tympanic membrane and external ear normal.     Nose: Nose normal.     Mouth/Throat:     Mouth: Mucous membranes are dry.     Pharynx: Oropharynx is clear.  Eyes:     General: No scleral icterus.       Right eye: No discharge.        Left eye: No discharge.     Extraocular Movements: Extraocular movements intact.     Conjunctiva/sclera: Conjunctivae normal.     Pupils: Pupils are equal, round, and reactive to light.  Neck:     Vascular: No JVD.  Cardiovascular:     Rate and Rhythm: Normal rate and regular rhythm.     Pulses: Normal pulses.          Radial pulses are 2+ on the right side and 2+ on the left side.     Heart sounds: Normal heart sounds.  Pulmonary:     Comments: Lungs clear to auscultation in all fields. Symmetric chest rise. No wheezing, rales, or rhonchi. Chest:    Abdominal:     General: Bowel sounds are normal.     Comments: Abdomen is soft, non-distended, and non-tender in all  quadrants. No rigidity, no guarding. No peritoneal signs.  Musculoskeletal:        General: Normal range of motion.     Cervical back: Normal range of motion.     Right lower leg: No edema.     Left lower leg: No edema.  Skin:    General: Skin is warm and dry.     Capillary Refill: Capillary refill takes less than 2 seconds.     Findings: No rash.     Comments: Fistula in LUE  Neurological:     Mental Status: She is oriented to person, place, and time.     GCS: GCS eye subscore is 4. GCS verbal subscore is 5. GCS motor subscore is 6.     Comments: Fluent speech, no facial droop.  Psychiatric:        Behavior:  Behavior normal.       LABORATORY DATA: I have reviewed the data as listed CBC Latest Ref Rng & Units 05/08/2021 05/02/2021 02/28/2021  WBC 4.0 - 10.5 K/uL 9.1 7.3 8.0  Hemoglobin 12.0 - 15.0 g/dL 14.1 13.3 13.9  Hematocrit 36.0 - 46.0 % 41.9 40.3 43.0  Platelets 150 - 400 K/uL 108(L) 165 175     CMP Latest Ref Rng & Units 05/08/2021 05/02/2021 02/28/2021  Glucose 70 - 99 mg/dL 397(H) 165(H) 42(LL)  BUN 6 - 20 mg/dL 35(H) 26(H) 25(H)  Creatinine 0.44 - 1.00 mg/dL 1.24(H) 1.24(H) 1.58(H)  Sodium 135 - 145 mmol/L 129(L) 138 139  Potassium 3.5 - 5.1 mmol/L 4.9 4.7 4.4  Chloride 98 - 111 mmol/L 97(L) 107 104  CO2 22 - 32 mmol/L _0 Calcium 8.9 - 10.3 mg/dL 9.7 9.9 10.4(H)  Total Protein 6.5 - 8.1 g/dL 6.0(L) 6.5 6.6  Total Bilirubin 0.3 - 1.2 mg/dL 0.7 0.4 0.4  Alkaline Phos 38 - 126 U/L 122 85 77  AST 15 - 41 U/L _1 ALT 0 - 44 U/L 24 41 47(H)       RADIOGRAPHIC STUDIES: I have personally reviewed the radiological images as listed and agreed with the findings in the report. No images are attached to the encounter. No results found.   ASSESSMENT & PLAN: Patient is a 50 y.o. female with history of malignant neoplasm of lower-inner quadrant of left breast, stage IS, c(T1c, No, ER+/PR+/HER2-), grade 2 who recently started chemo followed by oncologist Dr.  Burr Medico.   #)Nausea and vomiting- patient looks to not feel well although is non toxic. VSS. Abdominal exam is benign. Patient given 1L NS and zofran. She is tolerating sips of water on reassessment. If patient continues to feel dehydrated or symptomatic advised to call clinic and arrange for IV. Dr. Burr Medico agreeable with plan to hold off on repeat labs as she just had them in ED last night. I viewed those results.   #)diarrhea- patient gave stool sample, GI panel PCR and C diff testing sent. Advised patient and daughter to wait to take imodium until c diff test results.   #)  malignant neoplasm of lower-inner quadrant of left breast, stage IS, c(T1c, No, ER+/PR+/HER2-), grade 2 - Patient scheduled for chemo in x 2 days. Plan to continue with treatment as long as symptoms are better controlled.  Patient seen with oncologist Dr. Burr Medico.   Visit Diagnosis: 1. Nausea and vomiting, unspecified vomiting type   2. Poor fluid intake   3. Malignant neoplasm of lower-inner quadrant of left breast in female, estrogen receptor positive ([place])        Orders Placed This Encounter  Procedures   Gastrointestinal Panel by PCR , Stool    Standing Status:   Standing    Number of Occurrences:   1    Order Specific Question:   Patient immune status    Answer:   Immunocompromised   Enteric precautions (UV disinfection) C difficile, Norovirus    Standing Status:   Standing    Number of Occurrences:   1    All questions were answered. The patient knows to call the clinic with any problems, questions or concerns. No barriers to learning was detected.  I have spent a total of 20 minutes minutes of face-to-face and non-face-to-face time, preparing to see the patient, obtaining and/or reviewing separately obtained history, performing a medically appropriate examination, counseling and educating the patient, ordering tests,  documenting clinical information in the electronic health record, and care coordination.      Thank you for allowing me to participate in the care of this patient.    Barrie Folk, PA-C Department of Hematology/Oncology [facility] Phone: [phone]  [phone]    05/09/2021 5:25 PM   Addendum  I have seen the patient, examined her. I agree with the assessment and and plan and have edited the notes.   Ms Schiffer did not tolerate first cycle chemo well. She was evaluated in the ED for body aches and diarrhea.  Lab from ED reviewed.  Her body pain is likely related to GCSF. Will check C-DIFF, if negative, will continue imodium. Will give IVF and antiemetics today. RTC in 2 days as scheduled, she will call us tomorrow if she has persistent N/V and needs more IVF.  Truitt Merle  05/09/2021

## 2021-05-09 NOTE — Patient Instructions (Signed)

## 2021-05-09 NOTE — Discharge Instructions (Addendum)
Asegrese de estar bebiendo suficiente lquido.  Tome acetaminofn segn sea necesario para Conservation officer, historic buildings.  Tome loperamida (Imodium A-D) segn sea necesario para la diarrea.  Regrese si sus sntomas empeoran.

## 2021-05-10 ENCOUNTER — Ambulatory Visit (HOSPITAL_BASED_OUTPATIENT_CLINIC_OR_DEPARTMENT_OTHER): Payer: Medicare HMO | Admitting: Physician Assistant

## 2021-05-10 DIAGNOSIS — C50312 Malignant neoplasm of lower-inner quadrant of left female breast: Secondary | ICD-10-CM

## 2021-05-10 DIAGNOSIS — A09 Infectious gastroenteritis and colitis, unspecified: Secondary | ICD-10-CM

## 2021-05-10 DIAGNOSIS — Z170 Estrogen receptor positive status [ER+]: Secondary | ICD-10-CM | POA: Diagnosis not present

## 2021-05-10 NOTE — Progress Notes (Signed)
Symptom Management Consult note [place]   Patient Care Team: Arthur Holms, NP as PCP - General (Nurse Practitioner) Buford Dresser, MD as PCP - Cardiology (Cardiology) Rexene Agent, MD as Consulting Physician (Nephrology) Dwana Melena, MD as Referring Physician (Nephrology) Rockwell Germany, RN as Oncology Nurse Navigator Mauro Kaufmann, RN as Oncology Nurse Navigator Truitt Merle, MD as Consulting Physician (Hematology) Erroll Luna, MD as Consulting Physician (General Surgery) Eppie Gibson, MD as Attending Physician (Radiation Oncology)    Name of the patient: Cynthia [name]  [id number]  02-13-1971   Date of visit: 05/10/2021   I connected with Cynthia [name] daughter Cynthia [name] with patient's permission on 05/10/21 at  4:00 PM EST by telephone and verified that I am speaking with the correct person using two identifiers.   I discussed the limitations, risks, security and privacy concerns of performing an evaluation and management service by telemedicine and the availability of in-person appointments. I also discussed with the patient that there may be a patient responsible charge related to this service. The patient expressed understanding and agreed to proceed.    Patient's location: home Provider's location: office    Chief complaint/ Reason for visit- follow up dehydration and diarrhea  Oncology History Overview Note  Cancer Staging Malignant neoplasm of lower-inner quadrant of left breast in female, estrogen receptor positive ([place]) Staging form: Breast, AJCC 8th Edition - Clinical stage from 02/09/2021: Stage IA (cT1c, cN0, cM0, G2, ER+, PR+, HER2-) - Signed by Truitt Merle, MD on 02/20/2021 Stage prefix: Initial diagnosis Histologic grading system: 3 grade system - Pathologic stage from 03/02/2021: Stage IA (pT2, pN0, cM0, G2, ER+, PR+, HER2-, Oncotype DX score: 26) - Signed by Truitt Merle, MD on 03/21/2021 Stage prefix: Initial  diagnosis Multigene prognostic tests performed: Oncotype DX Recurrence score range: Greater than or equal to 11 Histologic grading system: 3 grade system Residual tumor (R): R0 - None    Malignant neoplasm of lower-inner quadrant of left breast in female, estrogen receptor positive ([place])  02/07/2021 Mammogram   Exam: Left Diagnostic Mammogram; Left Breast Ultrasound  IMPRESSION: Irregular mass in left breast at 6:30, measuring 1.6 cm by mammogram, is highly suggestive of malignancy. Left axillary ultrasound is negative for lymphadenopathy.   02/09/2021 Cancer Staging   Staging form: Breast, AJCC 8th Edition - Clinical stage from 02/09/2021: Stage IA (cT1c, cN0, cM0, G2, ER+, PR+, HER2-) - Signed by Truitt Merle, MD on 02/20/2021 Stage prefix: Initial diagnosis Histologic grading system: 3 grade system    02/09/2021 Pathology Results   Diagnosis Breast, left, needle core biopsy, 6:30 o'clock 8cm fn - INVASIVE DUCTAL CARCINOMA WITH HISTIOCYTOID FEATURES. SEE NOTE Diagnosis Note Carcinoma measures 1 cm in greatest linear dimension and appears grade 2.  PROGNOSTIC INDICATORS Results: IMMUNOHISTOCHEMICAL AND MORPHOMETRIC ANALYSIS PERFORMED MANUALLY The tumor cells are EQUIVOCAL for Her2 (2+). Her2 by FISH will be performed and results performed separately. Estrogen Receptor: >95%, POSITIVE, STRONG STAINING INTENSITY Progesterone Receptor: 90%, POSITIVE, STRONG STAINING INTENSITY Proliferation Marker Ki67: 20%  FLUORESCENCE IN-SITU HYBRIDIZATION Results: GROUP 5: HER2 **NEGATIVE** Equivocal form of amplification of the HER2 gene was detected in the IHC 2+ tissue sample received from this individual. HER2 FISH was performed by a technologist and cell imaging and analysis on the [employer]. RATIO OF HER2/CEN17 SIGNALS 1.30 AVERAGE HER2 COPY NUMBER PER CELL 1.75   02/15/2021 Initial Diagnosis   Malignant neoplasm of lower-inner quadrant of left breast in female, estrogen receptor  positive  ([place])   03/01/2021 Genetic Testing   Positive genetic testing: pathogenic variant detected in BRCA2 at [id number].  No other pathogenic variants detected in [facility] CustomNext Panel.  The report date is 03/19/2021.  The CustomNext-Cancer+RNAinsight panel offered by [facility] includes sequencing and rearrangement analysis for the following 47 genes:  APC, ATM, AXIN2, BARD1, BMPR1A, BRCA1, BRCA2, BRIP1, CDH1, CDK4, CDKN2A, CHEK2, DICER1, EPCAM, GREM1, HOXB13, MEN1, MLH1, MSH2, MSH3, MSH6, MUTYH, NBN, NF1, NF2, NTHL1, PALB2, PMS2, POLD1, POLE, PTEN, RAD51C, RAD51D, RECQL, RET, SDHA, SDHAF2, SDHB, SDHC, SDHD, SMAD4, SMARCA4, STK11, TP53, TSC1, TSC2, and VHL.  RNA data is routinely analyzed for use in variant interpretation for all genes.   03/02/2021 Cancer Staging   Staging form: Breast, AJCC 8th Edition - Pathologic stage from 03/02/2021: Stage IA (pT2, pN0, cM0, G2, ER+, PR+, HER2-, Oncotype DX score: 26) - Signed by Truitt Merle, MD on 03/21/2021 Stage prefix: Initial diagnosis Multigene prognostic tests performed: Oncotype DX Recurrence score range: Greater than or equal to 11 Histologic grading system: 3 grade system Residual tumor (R): R0 - None    05/02/2021 -  Chemotherapy   Patient is on Treatment Plan : BREAST TC q21d       Current Therapy: taxotere and cytotaxan last treatment was 05/02/21, udenyca recevied 05/04/21  Interval history- Cynthia [name] is a 50 yo female presenting via telephone for follow up visit for dehydration and diarrhea. Patient gave permission for daughter Cynthia [name] to communicate how she is doing. After receiving IVF, zofran and morphine yesterday patient was able to rest overnight. She has had a few episodes of diarrhea today, less compared to yesterday. She is tolerating fluid intake, however has spent most of the day sleeping so unsure if she is staying well hydrated. Informed daughter of test results and encouraged to use Imodium if diarrhea persists. Denies fever,  chills, chest pain, abdominal pain, rash, urinary symptoms.     ROS  All other systems are reviewed and are negative for acute change except as noted in the HPI.    Allergies  Allergen Reactions   Other Rash    Burns skin.  Please use paper tape only Burns skin.  Please use paper tape only   Norvasc [Amlodipine] Swelling   Tape Other (See Comments)    Burns skin.  Please use paper tape only     Past Medical History:  Diagnosis Date   Allergy    pollen   Anemia    when on dialysis   Anxiety    BRCA2 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 03/01/2021   Breast cancer [facility])    Cataract    surgical repair bilateral   Depression    ESRD on hemodialysis ([place])    Home HD 5x per week- not on dialysis now had tramsplant 4/17   Family history of ovarian cancer 02/22/2021   GERD (gastroesophageal reflux disease)    Hearing loss 2017   right ear   History of blood transfusion    transfusion reaction   Hyperlipidemia    Hypertension    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 per pt-dr levy also II   Kidney transplant recipient    Neuromuscular disorder ([place])    NEUROPATHY   Sleep apnea      Past Surgical History:  Procedure Laterality Date   ABDOMINAL HYSTERECTOMY     BREAST EXCISIONAL BIOPSY Right 08/2015   BREAST LUMPECTOMY WITH RADIOACTIVE SEED LOCALIZATION Right 09/14/2015  Procedure: RIGHT BREAST LUMPECTOMY WITH RADIOACTIVE SEED LOCALIZATION;  Surgeon: Donnie Mesa, MD;  Location: [place];  Service: General;  Laterality: Right;   BREAST LUMPECTOMY WITH RADIOACTIVE SEED LOCALIZATION Left 03/02/2021   Procedure: LEFT BREAST SEED LUMPECTOMY LEFT SENTINEL LYMPH NODE [place];  Surgeon: Erroll Luna, MD;  Location: [place];  Service: General;  Laterality: Left;  GEN AND PEC BLOCK   BREAST SURGERY Bilateral    biopsy bilateral   CATARACT EXTRACTION Bilateral    bilateral   CHOLECYSTECTOMY      CYST REMOVAL NECK     DIALYSIS FISTULA CREATION Left    EYE SURGERY Bilateral    lazer   LEFT HEART CATH AND CORONARY ANGIOGRAPHY N/A 03/04/2019   Procedure: LEFT HEART CATH AND CORONARY ANGIOGRAPHY;  Surgeon: Martinique, Peter M, MD;  Location: [facility];  Service: Cardiovascular;  Laterality: N/A;   LEFT HEART CATHETERIZATION WITH CORONARY ANGIOGRAM N/A 03/30/2014   Procedure: LEFT HEART CATHETERIZATION WITH CORONARY ANGIOGRAM;  Surgeon: Sinclair Grooms, MD;  Location[facility] CATH LAB;  Service: Cardiovascular;  Laterality: N/A;   LIPOMA EXCISION N/A 02/06/2017   Procedure: EXCISION POSTERIOR NECK SEBACEOUS CYST;  Surgeon: Coralie Keens, MD;  Location: [place];  Service: General;  Laterality: N/A;   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   Procedure: REPAIR OF ARTERIOVENOUS FISTULA ANEURYSM;  Surgeon: Serafina Mitchell, MD;  Location: [facility] OR;  Service: Vascular;  Laterality: Left;   REVISON OF ARTERIOVENOUS FISTULA Left 09/28/2013   Procedure: EXCISE ESCHAR LEFT ARM  ARTERIOVENOUS FISTULA WITH PLICATION OF LEFT ARM ARTERIOVENOUS FISTULA;  Surgeon: Elam Dutch, MD;  Location: [facility] OR;  Service: Vascular;  Laterality: Left;   REVISON OF ARTERIOVENOUS FISTULA Left 08/26/2015   Procedure: RESECTION ANEURYSM OF LEFT ARM ARTERIOVENOUS FISTULA  ;  Surgeon: Serafina Mitchell, MD;  Location: [facility] OR;  Service: Vascular;  Laterality: Left;   TUBAL LIGATION      Social History   Socioeconomic History   Marital status: Legally Separated    Spouse name: Not on file   Number of children: 3   Years of education: 9   Highest education level: Not on file  Occupational History   Occupation: disabled  Tobacco Use   Smoking status: Never   Smokeless tobacco: Never  Vaping Use   Vaping Use: Never used  Substance and Sexual Activity   Alcohol use: Yes    Comment: Socially   Drug use: No   Sexual activity: Yes    Birth control/protection: None  Other Topics Concern   Not on file   Social History Narrative   Lives in home with daughters, grand daughter, son-in-law   Caffeine use - 1 cup coffee sometimes    Social Determinants of Health   Financial Resource Strain: High Risk   Difficulty of Paying Living Expenses: Hard  Food Insecurity: Food Insecurity Present   Worried About Estate manager/land agent of Food in the Last Year: Sometimes true   Ran Out of Food in the Last Year: Sometimes true  Transportation Needs: No Transportation Needs   Lack of Transportation (Medical): No   Lack of Transportation (Non-Medical): No  Physical Activity: Not on file  Stress: Not on file  Social Connections: Not on file  Intimate Partner Violence: Not on file    Family History  Problem Relation Age of Onset   Diabetes Mother    Kidney disease Mother    Diabetes Father    Heart  disease Father    Kidney disease Father    Hypertension Father    Diabetes Sister    Asthma Sister    Lupus Sister    Diabetes Brother    Diabetes Brother    Diabetes Brother    Diabetes Brother    Cancer Maternal Grandmother 75       ovarian cancer   Diabetes Maternal Grandfather    Diabetes Paternal Grandmother    Colon cancer Neg Hx    Colon polyps Neg Hx    Esophageal cancer Neg Hx    Rectal cancer Neg Hx    Stomach cancer Neg Hx      Current Outpatient Medications:    Accu-Chek Softclix Lancets lancets, Use as instructed, Disp: 100 each, Rfl: 12   aspirin EC 81 MG EC tablet, Take 1 tablet (81 mg total) by mouth daily. (Patient taking differently: Take 81 mg by mouth at bedtime.), Disp: 90 tablet, Rfl: 3   B Complex-C (SUPER B COMPLEX PO), Take 1 tablet by mouth daily. , Disp: , Rfl:    Blood Glucose Monitoring Suppl (ACCU-CHEK AVIVA CONNECT) w/Device KIT, Check sugar 1x daily, Disp: 1 kit, Rfl: 0   carvedilol (COREG) 25 MG tablet, Take 12.5 mg by mouth 2 (two) times daily with a meal. , Disp: , Rfl:    cetirizine (ZYRTEC) 10 MG tablet, Take 5 mg by mouth daily. Takes 0.5 tablet daily, Disp: ,  Rfl:    Cholecalciferol (VITAMIN D3) 1000 units CAPS, Take 1,000 Units by mouth daily. , Disp: , Rfl:    dexamethasone (DECADRON) 4 MG tablet, Take 2 tablets (8 mg total) by mouth 2 (two) times daily. Start the day before Taxotere. Then again the day after chemo for 3 days., Disp: 30 tablet, Rfl: 1   fludrocortisone (FLORINEF) 0.1 MG tablet, Take 0.1 mg by mouth daily., Disp: , Rfl:    FLUoxetine (PROZAC) 40 MG capsule, Take 40 mg by mouth daily., Disp: , Rfl:    furosemide (LASIX) 40 MG tablet, Take 40 mg by mouth 2 (two) times daily., Disp: , Rfl:    gabapentin (NEURONTIN) 300 MG capsule, Take 300 mg by mouth at bedtime., Disp: , Rfl: 12   glucose blood (ACCU-CHEK AVIVA PLUS) test strip, Check sugar 1x daily, Disp: 100 each, Rfl: 12   ibuprofen (ADVIL) 800 MG tablet, Take 1 tablet (800 mg total) by mouth every 8 (eight) hours as needed., Disp: 30 tablet, Rfl: 0   insulin glargine (LANTUS SOLOSTAR) 100 UNIT/ML Solostar Pen, Inject 65 Units into the skin every morning., Disp: 75 mL, Rfl: PRN   Insulin Pen Needle (PEN NEEDLES) 30G X 8 MM MISC, 1 each by Does not apply route daily. E11.9, Disp: 90 each, Rfl: 0   loperamide (IMODIUM) 2 MG capsule, Take 1 capsule (2 mg total) by mouth 4 (four) times daily as needed for diarrhea or loose stools., Disp: 12 capsule, Rfl: 0   magnesium oxide (MAG-OX) 400 MG tablet, Take 400 mg by mouth 2 (two) times daily. , Disp: , Rfl:    mycophenolate (MYFORTIC) 180 MG EC tablet, Take 360 mg by mouth 2 (two) times daily., Disp: , Rfl:    ondansetron (ZOFRAN) 8 MG tablet, Take 1 tablet (8 mg total) by mouth 2 (two) times daily as needed for refractory nausea / vomiting. Start on day 3 after chemo., Disp: 30 tablet, Rfl: 1   oxyCODONE (OXY IR/ROXICODONE) 5 MG immediate release tablet, Take 1 tablet (5 mg total) by mouth  every 6 (six) hours as needed for severe pain., Disp: 15 tablet, Rfl: 0   pantoprazole (PROTONIX) 40 MG tablet, Take 40 mg by mouth daily., Disp: , Rfl: 5    predniSONE (DELTASONE) 5 MG tablet, Take 5 mg by mouth daily. , Disp: , Rfl: 2   prochlorperazine (COMPAZINE) 10 MG tablet, Take 1 tablet (10 mg total) by mouth every 6 (six) hours as needed (Nausea or vomiting)., Disp: 30 tablet, Rfl: 1   rosuvastatin (CRESTOR) 5 MG tablet, Take 5 mg by mouth at bedtime., Disp: , Rfl:    Tacrolimus ER (ENVARSUS XR) 1 MG TB24, Take 3 mg by mouth every morning., Disp: , Rfl:    vitamin E 400 UNIT capsule, Take 400 Units by mouth daily., Disp: , Rfl:   PHYSICAL EXAM: ECOG FS:1 - Symptomatic but completely ambulatory   There were no vitals filed for this visit.    LABORATORY DATA: I have reviewed the data as listed CBC Latest Ref Rng & Units 05/08/2021 05/02/2021 02/28/2021  WBC 4.0 - 10.5 K/uL 9.1 7.3 8.0  Hemoglobin 12.0 - 15.0 g/dL 14.1 13.3 13.9  Hematocrit 36.0 - 46.0 % 41.9 40.3 43.0  Platelets 150 - 400 K/uL 108(L) 165 175     CMP Latest Ref Rng & Units 05/08/2021 05/02/2021 02/28/2021  Glucose 70 - 99 mg/dL 397(H) 165(H) 42(LL)  BUN 6 - 20 mg/dL 35(H) 26(H) 25(H)  Creatinine 0.44 - 1.00 mg/dL 1.24(H) 1.24(H) 1.58(H)  Sodium 135 - 145 mmol/L 129(L) 138 139  Potassium 3.5 - 5.1 mmol/L 4.9 4.7 4.4  Chloride 98 - 111 mmol/L 97(L) 107 104  CO2 22 - 32 mmol/L '24 24 26  ' Calcium 8.9 - 10.3 mg/dL 9.7 9.9 10.4(H)  Total Protein 6.5 - 8.1 g/dL 6.0(L) 6.5 6.6  Total Bilirubin 0.3 - 1.2 mg/dL 0.7 0.4 0.4  Alkaline Phos 38 - 126 U/L 122 85 77  AST 15 - 41 U/L '17 21 26  ' ALT 0 - 44 U/L 24 41 47(H)       RADIOGRAPHIC STUDIES: I have personally reviewed the radiological images as listed and agreed with the findings in the report. No images are attached to the encounter. No results found.   ASSESSMENT & PLAN: Patient is a 50 y.o. female with history of malignant neoplasm of lower-inner quadrant of left breast followed by Dr. Burr Medico.  #)Diarrhea- c diff negative. PCR positive of pathogenic E. Coli. Patient aware to take Imodium if diarrhea persists.  Discussed the importance of staying well hydrated and symptomatic treatment. Patient still having decreased PO intake, although seems improved compared to yesterday. Appointment for IVF made for tomorrow to provide hydration support.  #) Malignant neosplasm of left breast- scheduled for toxicity check tomorrow with Dr. Burr Medico, plans to come to that appointment.   Visit Diagnosis: 1. Acute infectious diarrhea   2. Malignant neoplasm of lower-inner quadrant of left breast in female, estrogen receptor positive ([place])      No orders of the defined types were placed in this encounter.   All questions were answered. The patient knows to call the clinic with any problems, questions or concerns. No barriers to learning was detected.  I have spent a total of 10 minutes minutes of face-to-face and non-face-to-face time, preparing to see the patient, obtaining and/or reviewing separately obtained history, performing a medically appropriate examination, counseling and educating the patient, ordering tests,  documenting clinical information in the electronic health record, and care coordination.     Thank you  for allowing me to participate in the care of this patient.    Barrie Folk, PA-C Department of Hematology/Oncology [facility] at [facility] Phone: [phone]  [phone]    05/10/2021 3:54 PM

## 2021-05-11 ENCOUNTER — Inpatient Hospital Stay: Payer: Medicare HMO

## 2021-05-11 ENCOUNTER — Other Ambulatory Visit: Payer: Self-pay

## 2021-05-11 ENCOUNTER — Inpatient Hospital Stay (HOSPITAL_BASED_OUTPATIENT_CLINIC_OR_DEPARTMENT_OTHER): Payer: Medicare HMO | Admitting: Hematology

## 2021-05-11 VITALS — BP 150/53 | HR 78 | Temp 98.2°F | Resp 17 | Wt 209.0 lb

## 2021-05-11 DIAGNOSIS — Z170 Estrogen receptor positive status [ER+]: Secondary | ICD-10-CM

## 2021-05-11 DIAGNOSIS — Z1502 Genetic susceptibility to malignant neoplasm of ovary: Secondary | ICD-10-CM | POA: Diagnosis not present

## 2021-05-11 DIAGNOSIS — C50312 Malignant neoplasm of lower-inner quadrant of left female breast: Secondary | ICD-10-CM | POA: Diagnosis not present

## 2021-05-11 DIAGNOSIS — Z1501 Genetic susceptibility to malignant neoplasm of breast: Secondary | ICD-10-CM | POA: Diagnosis not present

## 2021-05-11 DIAGNOSIS — Z1509 Genetic susceptibility to other malignant neoplasm: Secondary | ICD-10-CM

## 2021-05-11 DIAGNOSIS — E860 Dehydration: Secondary | ICD-10-CM

## 2021-05-11 DIAGNOSIS — Z5111 Encounter for antineoplastic chemotherapy: Secondary | ICD-10-CM | POA: Diagnosis not present

## 2021-05-11 LAB — GASTROINTESTINAL PANEL BY PCR, STOOL (REPLACES STOOL CULTURE)

## 2021-05-11 LAB — CBC WITH DIFFERENTIAL (CANCER CENTER ONLY)
Abs Immature Granulocytes: 3.3 10*3/uL — ABNORMAL HIGH (ref 0.00–0.07)
Band Neutrophils: 7 %
Basophils Absolute: 0 10*3/uL (ref 0.0–0.1)
Basophils Relative: 0 %
Eosinophils Absolute: 0.3 10*3/uL (ref 0.0–0.5)
Eosinophils Relative: 1 %
HCT: 38.2 % (ref 36.0–46.0)
Hemoglobin: 12.5 g/dL (ref 12.0–15.0)
Lymphocytes Relative: 9 %
Lymphs Abs: 2.5 10*3/uL (ref 0.7–4.0)
MCH: 29.8 pg (ref 26.0–34.0)
MCHC: 32.7 g/dL (ref 30.0–36.0)
MCV: 91.2 fL (ref 80.0–100.0)
Metamyelocytes Relative: 8 %
Monocytes Absolute: 0.3 10*3/uL (ref 0.1–1.0)
Monocytes Relative: 1 %
Myelocytes: 4 %
Neutro Abs: 21.5 10*3/uL — ABNORMAL HIGH (ref 1.7–7.7)
Neutrophils Relative %: 70 %
Platelet Count: 116 10*3/uL — ABNORMAL LOW (ref 150–400)
RBC: 4.19 MIL/uL (ref 3.87–5.11)
RDW: 12.5 % (ref 11.5–15.5)
WBC Count: 27.9 10*3/uL — ABNORMAL HIGH (ref 4.0–10.5)
nRBC: 0.6 % — ABNORMAL HIGH (ref 0.0–0.2)

## 2021-05-11 LAB — CMP (CANCER CENTER ONLY)
ALT: 20 U/L (ref 0–44)
AST: 16 U/L (ref 15–41)
Albumin: 2.8 g/dL — ABNORMAL LOW (ref 3.5–5.0)
Alkaline Phosphatase: 138 U/L — ABNORMAL HIGH (ref 38–126)
Anion gap: 5 (ref 5–15)
BUN: 17 mg/dL (ref 6–20)
CO2: 26 mmol/L (ref 22–32)
Calcium: 8.9 mg/dL (ref 8.9–10.3)
Chloride: 101 mmol/L (ref 98–111)
Creatinine: 1.25 mg/dL — ABNORMAL HIGH (ref 0.44–1.00)
GFR, Estimated: 52 mL/min — ABNORMAL LOW (ref >60)
Glucose, Bld: 286 mg/dL — ABNORMAL HIGH (ref 70–99)
Potassium: 5 mmol/L (ref 3.5–5.1)
Sodium: 132 mmol/L — ABNORMAL LOW (ref 135–145)
Total Bilirubin: <0.2 mg/dL — ABNORMAL LOW (ref 0.3–1.2)
Total Protein: 5 g/dL — ABNORMAL LOW (ref 6.5–8.1)

## 2021-05-11 MED ORDER — SODIUM CHLORIDE 0.9 % IV SOLN: INTRAVENOUS | Status: DC

## 2021-05-11 NOTE — Progress Notes (Signed)
Per Dr. Burr Medico, Administer 76ml of NS over 1 hr.

## 2021-05-11 NOTE — Patient Instructions (Signed)
Dieta de lquidos claros, adultos Clear Liquid Diet, Adult Uma dieta de lquidos claros  uma dieta que inclui somente lquidos [employer]. Nessa dieta, no se ingere nenhum alimento slido. A maioria das pessoas precisa seguir essa dieta apenas por pouco tempo. Uma pessoa pode precisar fazer uma dieta de lquidos claros se: Apresentar um quadro clnico logo antes ou aps fazer uma cirurgia. No tiver conseguido ingerir comida por um longo tempo. Tiver enjoo, vmito ou diarreia. For fazer um procedimento ou exame para Water engineer do sistema digestivo. For fazer uma cirurgia no intestino. Quais so os objetivos dessa dieta? Os objetivos usuais dessa dieta so: Descansar o estmago e o sistema digestivo tanto quanto possvel. Ajudar a limpar o sistema digestivo antes de um procedimento ou exame. Manter voc hidratado. Garantir que voc obtenha a energia de Cytogeneticist. Ajudar voc a voltar  sua alimentao de rotina normal. Quais so as dicas para seguir esse plano? Um lquido claro  um lquido ou semilquido, como gelatina, [place] do qual  possvel enxergar quando colocado contra a Actuary. Uma dieta de lquidos claros no fornece todos os nutrientes de que voc precisa.  importante selecionar uma diversidade dos lquidos ou semilquidos permitidos nessa dieta. Dessa maneira, voc obter tantos nutrientes quanto possvel. Se no tiver certeza se pode ingerir certos itens, voc dever perguntar ao seu mdico. Se voc tiver dificuldade de engolir lquidos finos, precisar engross-los para Financial trader (aspirao). Quais alimentos devo comer?  gua e gua aromatizada. Sucos de frutas sem polpa, como de oxicoco, ma ou uva. Ch e caf sem leite ou creme. Caldo de carne ou de galinha transparente. Sopas  base de caldo peneiradas. Gelatina com sabor. Mel. gua com acar. Gelo ou picols que no contenham leite, iogurte, pedaos de frutas ou polpa de  fruta. Refrigerantes transparentes. Bebidas esportivas transparentes. Os itens listados acima podem no ser [place] alimentos e bebidas que voc pode consumir. [place] com um nutricionista para obter mais informaes. Que alimentos devo evitar? Sucos com polpa. Leite. Creme ou sopas  base de creme. Iogurte. Alimentos slidos que no sejam lquidos ou semilquidos claros. Os itens listados acima podem no ser [place] alimentos e bebidas que voc deve evitar. [place] com um nutricionista para obter mais informaes. Perguntas a fazer a seu mdico: Por quanto tempo preciso seguir essa dieta? H algum medicamento que devo mudar enquanto estiver fazendo essa dieta? Resumo Uma dieta de lquidos claros  uma dieta que inclui lquidos [employer]. O objetivo dessa dieta  ajudar voc a se recuperar descansando o sistema digestivo, mant-lo hidratado e proporcionar nutrientes. Evite lquidos com leite, creme de leite ou polpa enquanto estiver seguindo essa dieta. Estas informaes no se destinam a substituir as recomendaes de seu mdico. No deixe de discutir quaisquer dvidas com seu mdico. Document Revised: 04/11/2020 Document Reviewed: 04/11/2020 Elsevier Patient Education  2022 [employer].

## 2021-05-11 NOTE — Progress Notes (Signed)
[facility]   [phone] [phone]   Clinic Follow up Note   Patient Care Team: Arthur Holms, NP as PCP - General (Nurse Practitioner) Buford Dresser, MD as PCP - Cardiology (Cardiology) Rexene Agent, MD as Consulting Physician (Nephrology) Dwana Melena, MD as Referring Physician (Nephrology) Rockwell Germany, RN as Oncology Nurse Navigator Mauro Kaufmann, RN as Oncology Nurse Navigator Truitt Merle, MD as Consulting Physician (Hematology) Erroll Luna, MD as Consulting Physician (General Surgery) Eppie Gibson, MD as Attending Physician (Radiation Oncology)  Date of Service:  05/11/2021  CHIEF COMPLAINT: f/u of left breast cancer  CURRENT THERAPY:  adjuvant TC q21 days x4 cycles starting 05/02/21  ASSESSMENT & PLAN:  Cynthia [name] is a 50 y.o. female with   1. 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 Brantley Stage 03/02/2021, surgical path showed invasive and in situ ductal carcinoma, grade 2, node-negative with close but clear margins.  ER/PR positive, HER2 negative, Ki-67 of 20%.  Oncotype showed high risk with recurrence score 26 which predicts 9-year distant recurrence after 5 years of tamoxifen at 16% -She began adjuvant TC on 05/02/21 after delayed wound healing. She tolerated C1 poorly with nausea with vomiting and diarrhea. She has been able to recover well, and we will continue with IVF today (1hr instead of 2hr) -plan to proceed with C2 on 05/24/21 as scheduled.   2. Comorbidities: DM with neuropathy, HTN, s/p kidney transplant -continue medications and f/u -she has diabetic neuropathy at baseline, on gabapentin -On prednisone and tacrolimus following right kidney transplant in 2017.  I will send a message to nephrologist to inform them that we are starting chemo   3. Covid-19+ 05/2019 -she received booster recently     4.  BRCA2 + pathogenic mutation  -Found on BRCA1/2 analysis and [facility] genetics panel from 02/22/2021 -We reviewed the increased risk of recurrent breast and or contralateral breast cancer, ovarian cancer, pancreatic cancer, and melanoma -She is reluctant to consider another breast surgery at this time given her delayed healing from lumpectomy.  If she is not interested in risk reducing mastectomy, we would likely recommend close surveillance with annual mammogram and screening breast MRI in the future -She is s/p hysterectomy in 2001 at which time she became menopausal, ovaries remain.  She is open to oophorectomy, plan to refer her to gyn onc after she completes chemo to discuss screening vs risk reducing surgery. If she does not proceed with surgery soon we may start her on ovarian suppression after chemo -she has no family history of pancreatic cancer, we reviewed screening options. I will likely refer her to GI for discussion of EUS vs MRI and tumor markers after chemo -she has no personal h/o skin cancer, I will refer her to derm for screening after chemo -her 3 daughters have been tested and all are negative.     PLAN: -proceed with 1hr IVF today -lab, f/u, and C2 TC on 05/24/21   No problem-specific Assessment & Plan notes found for this encounter.   SUMMARY OF ONCOLOGIC HISTORY: Oncology History Overview Note  Cancer Staging Malignant neoplasm of lower-inner quadrant of left breast in female, estrogen receptor positive ([place]) Staging form: Breast, AJCC 8th Edition - Clinical stage from 02/09/2021: Stage IA (cT1c, cN0, cM0, G2, ER+, PR+, HER2-) - Signed by Truitt Merle, MD on 02/20/2021 Stage prefix: Initial diagnosis Histologic grading system: 3 grade system -  Pathologic stage from 03/02/2021: Stage IA (pT2, pN0, cM0, G2, ER+, PR+, HER2-, Oncotype DX score: 26) - Signed by Truitt Merle, MD on 03/21/2021 Stage prefix: Initial diagnosis Multigene prognostic tests performed: Oncotype  DX Recurrence score range: Greater than or equal to 11 Histologic grading system: 3 grade system Residual tumor (R): R0 - None    Malignant neoplasm of lower-inner quadrant of left breast in female, estrogen receptor positive ([place])  02/07/2021 Mammogram   Exam: Left Diagnostic Mammogram; Left Breast Ultrasound  IMPRESSION: Irregular mass in left breast at 6:30, measuring 1.6 cm by mammogram, is highly suggestive of malignancy. Left axillary ultrasound is negative for lymphadenopathy.   02/09/2021 Cancer Staging   Staging form: Breast, AJCC 8th Edition - Clinical stage from 02/09/2021: Stage IA (cT1c, cN0, cM0, G2, ER+, PR+, HER2-) - Signed by Truitt Merle, MD on 02/20/2021 Stage prefix: Initial diagnosis Histologic grading system: 3 grade system    02/09/2021 Pathology Results   Diagnosis Breast, left, needle core biopsy, 6:30 o'clock 8cm fn - INVASIVE DUCTAL CARCINOMA WITH HISTIOCYTOID FEATURES. SEE NOTE Diagnosis Note Carcinoma measures 1 cm in greatest linear dimension and appears grade 2.  PROGNOSTIC INDICATORS Results: IMMUNOHISTOCHEMICAL AND MORPHOMETRIC ANALYSIS PERFORMED MANUALLY The tumor cells are EQUIVOCAL for Her2 (2+). Her2 by FISH will be performed and results performed separately. Estrogen Receptor: >95%, POSITIVE, STRONG STAINING INTENSITY Progesterone Receptor: 90%, POSITIVE, STRONG STAINING INTENSITY Proliferation Marker Ki67: 20%  FLUORESCENCE IN-SITU HYBRIDIZATION Results: GROUP 5: HER2 **NEGATIVE** Equivocal form of amplification of the HER2 gene was detected in the IHC 2+ tissue sample received from this individual. HER2 FISH was performed by a technologist and cell imaging and analysis on the [employer]. RATIO OF HER2/CEN17 SIGNALS 1.30 AVERAGE HER2 COPY NUMBER PER CELL 1.75   02/15/2021 Initial Diagnosis   Malignant neoplasm of lower-inner quadrant of left breast in female, estrogen receptor positive ([place])   03/01/2021 Genetic Testing   Positive genetic  testing: pathogenic variant detected in BRCA2 at [id number].  No other pathogenic variants detected in [facility] Custom[employer] Panel.  The report date is 03/19/2021.  The CustomNext-Cancer+RNAinsight panel offered by [facility] includes sequencing and rearrangement analysis for the following 47 genes:  APC, ATM, AXIN2, BARD1, BMPR1A, BRCA1, BRCA2, BRIP1, CDH1, CDK4, CDKN2A, CHEK2, DICER1, EPCAM, GREM1, HOXB13, MEN1, MLH1, MSH2, MSH3, MSH6, MUTYH, NBN, NF1, NF2, NTHL1, PALB2, PMS2, POLD1, POLE, PTEN, RAD51C, RAD51D, RECQL, RET, SDHA, SDHAF2, SDHB, SDHC, SDHD, SMAD4, SMARCA4, STK11, TP53, TSC1, TSC2, and VHL.  RNA data is routinely analyzed for use in variant interpretation for all genes.   03/02/2021 Cancer Staging   Staging form: Breast, AJCC 8th Edition - Pathologic stage from 03/02/2021: Stage IA (pT2, pN0, cM0, G2, ER+, PR+, HER2-, Oncotype DX score: 26) - Signed by Truitt Merle, MD on 03/21/2021 Stage prefix: Initial diagnosis Multigene prognostic tests performed: Oncotype DX Recurrence score range: Greater than or equal to 11 Histologic grading system: 3 grade system Residual tumor (R): R0 - None    05/02/2021 -  Chemotherapy   Patient is on Treatment Plan : BREAST TC q21d        INTERVAL HISTORY:  Cynthia [name] is here for a follow up of breast cancer. She was last seen by NP [name] on 05/02/21 and in our symptom management clinic in the interim. She presents to the clinic accompanied by an interpreter and her daughter. She reports she is feeling better. She reports her last bowel movement yesterday was loose. Her daughter reports Cynthia [name] was having low back and hip  pain, possibly from the injection. She endorses using Claritin.   All other systems were reviewed with the patient and are negative.  MEDICAL HISTORY:  Past Medical History:  Diagnosis Date   Allergy    pollen   Anemia    when on dialysis   Anxiety    BRCA2 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 03/01/2021   Breast cancer  [facility])    Cataract    surgical repair bilateral   Depression    ESRD on hemodialysis ([place])    Home HD 5x per week- not on dialysis now had tramsplant 4/17   Family history of ovarian cancer 02/22/2021   GERD (gastroesophageal reflux disease)    Hearing loss 2017   right ear   History of blood transfusion    transfusion reaction   Hyperlipidemia    Hypertension    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 per pt-dr levy also II   Kidney transplant recipient    Neuromuscular disorder ([place])    NEUROPATHY   Sleep apnea     SURGICAL HISTORY: Past Surgical History:  Procedure Laterality Date   ABDOMINAL HYSTERECTOMY     BREAST EXCISIONAL BIOPSY Right 08/2015   BREAST LUMPECTOMY WITH RADIOACTIVE SEED LOCALIZATION Right 09/14/2015   Procedure: RIGHT BREAST LUMPECTOMY WITH RADIOACTIVE SEED LOCALIZATION;  Surgeon: Donnie Mesa, MD;  Location: [facility];  Service: General;  Laterality: Right;   BREAST LUMPECTOMY WITH RADIOACTIVE SEED LOCALIZATION Left 03/02/2021   Procedure: LEFT BREAST SEED LUMPECTOMY LEFT SENTINEL LYMPH NODE [place];  Surgeon: Erroll Luna, MD;  Location[facility];  Service: General;  Laterality: Left;  GEN AND PEC BLOCK   BREAST SURGERY Bilateral    biopsy bilateral   CATARACT EXTRACTION Bilateral    bilateral   CHOLECYSTECTOMY     CYST REMOVAL NECK     DIALYSIS FISTULA CREATION Left    EYE SURGERY Bilateral    lazer   LEFT HEART CATH AND CORONARY ANGIOGRAPHY N/A 03/04/2019   Procedure: LEFT HEART CATH AND CORONARY ANGIOGRAPHY;  Surgeon: Martinique, Peter M, MD;  Location: [facility];  Service: Cardiovascular;  Laterality: N/A;   LEFT HEART CATHETERIZATION WITH CORONARY ANGIOGRAM N/A 03/30/2014   Procedure: LEFT HEART CATHETERIZATION WITH CORONARY ANGIOGRAM;  Surgeon: Sinclair Grooms, MD;  Location: [facility] CATH LAB;  Service: Cardiovascular;  Laterality: N/A;   LIPOMA EXCISION N/A  02/06/2017   Procedure: EXCISION POSTERIOR NECK SEBACEOUS CYST;  Surgeon: Coralie Keens, MD;  Location: [facility];  Service: General;  Laterality: N/A;   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   Procedure: REPAIR OF ARTERIOVENOUS FISTULA ANEURYSM;  Surgeon: Serafina Mitchell, MD;  Location: [facility];  Service: Vascular;  Laterality: Left;   REVISON OF ARTERIOVENOUS FISTULA Left 09/28/2013   Procedure: EXCISE ESCHAR LEFT ARM  ARTERIOVENOUS FISTULA WITH PLICATION OF LEFT ARM ARTERIOVENOUS FISTULA;  Surgeon: Elam Dutch, MD;  Location: [facility];  Service: Vascular;  Laterality: Left;   REVISON OF ARTERIOVENOUS FISTULA Left 08/26/2015   Procedure: RESECTION ANEURYSM OF LEFT ARM ARTERIOVENOUS FISTULA  ;  Surgeon: Serafina Mitchell, MD;  Location: [facility];  Service: Vascular;  Laterality: Left;   TUBAL LIGATION      I have reviewed the social history and family history with the patient and they are unchanged from previous note.  ALLERGIES:  is allergic to other, norvasc [amlodipine], and tape.  MEDICATIONS:  Current Outpatient Medications  Medication Sig Dispense Refill   Accu-Chek Softclix Lancets lancets Use as instructed 100 each 12   aspirin EC 81 MG EC tablet Take 1 tablet (81 mg total) by mouth daily. (Patient taking differently: Take 81 mg by mouth at bedtime.) 90 tablet 3   B Complex-C (SUPER B COMPLEX PO) Take 1 tablet by mouth daily.      Blood Glucose Monitoring Suppl (ACCU-CHEK AVIVA CONNECT) w/Device KIT Check sugar 1x daily 1 kit 0   carvedilol (COREG) 25 MG tablet Take 12.5 mg by mouth 2 (two) times daily with a meal.      cetirizine (ZYRTEC) 10 MG tablet Take 5 mg by mouth daily. Takes 0.5 tablet daily     Cholecalciferol (VITAMIN D3) 1000 units CAPS Take 1,000 Units by mouth daily.      dexamethasone (DECADRON) 4 MG tablet Take 2 tablets (8 mg total) by mouth 2 (two) times daily. Start the day before Taxotere. Then again the day after chemo for 3 days. 30 tablet 1    fludrocortisone (FLORINEF) 0.1 MG tablet Take 0.1 mg by mouth daily.     FLUoxetine (PROZAC) 40 MG capsule Take 40 mg by mouth daily.     furosemide (LASIX) 40 MG tablet Take 40 mg by mouth 2 (two) times daily.     gabapentin (NEURONTIN) 300 MG capsule Take 300 mg by mouth at bedtime.  12   glucose blood (ACCU-CHEK AVIVA PLUS) test strip Check sugar 1x daily 100 each 12   ibuprofen (ADVIL) 800 MG tablet Take 1 tablet (800 mg total) by mouth every 8 (eight) hours as needed. 30 tablet 0   insulin glargine (LANTUS SOLOSTAR) 100 UNIT/ML Solostar Pen Inject 65 Units into the skin every morning. 75 mL PRN   Insulin Pen Needle (PEN NEEDLES) 30G X 8 MM MISC 1 each by Does not apply route daily. E11.9 90 each 0   loperamide (IMODIUM) 2 MG capsule Take 1 capsule (2 mg total) by mouth 4 (four) times daily as needed for diarrhea or loose stools. 12 capsule 0   magnesium oxide (MAG-OX) 400 MG tablet Take 400 mg by mouth 2 (two) times daily.      mycophenolate (MYFORTIC) 180 MG EC tablet Take 360 mg by mouth 2 (two) times daily.     ondansetron (ZOFRAN) 8 MG tablet Take 1 tablet (8 mg total) by mouth 2 (two) times daily as needed for refractory nausea / vomiting. Start on day 3 after chemo. 30 tablet 1   oxyCODONE (OXY IR/ROXICODONE) 5 MG immediate release tablet Take 1 tablet (5 mg total) by mouth every 6 (six) hours as needed for severe pain. 15 tablet 0   pantoprazole (PROTONIX) 40 MG tablet Take 40 mg by mouth daily.  5   predniSONE (DELTASONE) 5 MG tablet Take 5 mg by mouth daily.   2   prochlorperazine (COMPAZINE) 10 MG tablet Take 1 tablet (10 mg total) by mouth every 6 (six) hours as needed (Nausea or vomiting). 30 tablet 1   rosuvastatin (CRESTOR) 5 MG tablet Take 5 mg by mouth at bedtime.     Tacrolimus ER (ENVARSUS XR) 1 MG TB24 Take 3 mg by mouth every morning.     vitamin E 400 UNIT capsule Take 400 Units by mouth daily.     No current facility-administered medications for this visit.    Facility-Administered Medications Ordered in Other Visits  Medication Dose Route Frequency Provider Last Rate Last Admin   0.9 %  sodium chloride  infusion   Intravenous Continuous Truitt Merle, MD   Stopped at 05/11/21 1653    PHYSICAL EXAMINATION: ECOG PERFORMANCE STATUS: 2 - Symptomatic, <50% confined to bed  Vitals:   05/11/21 1514  BP: (!) 150/53  Pulse: 78  Resp: 17  Temp: 98.2 F (36.8 C)  SpO2: 96%   Wt Readings from Last 3 Encounters:  05/11/21 209 lb (94.8 kg)  05/08/21 215 lb (97.5 kg)  05/02/21 215 lb 1 oz (97.6 kg)     GENERAL:alert, no distress and comfortable SKIN: skin color normal, no rashes or significant lesions EYES: normal, Conjunctiva are pink and non-injected, sclera clear  NEURO: alert & oriented x 3 with fluent speech  LABORATORY DATA:  I have reviewed the data as listed CBC Latest Ref Rng & Units 05/11/2021 05/08/2021 05/02/2021  WBC 4.0 - 10.5 K/uL 27.9(H) 9.1 7.3  Hemoglobin 12.0 - 15.0 g/dL 12.5 14.1 13.3  Hematocrit 36.0 - 46.0 % 38.2 41.9 40.3  Platelets 150 - 400 K/uL 116(L) 108(L) 165     CMP Latest Ref Rng & Units 05/11/2021 05/08/2021 05/02/2021  Glucose 70 - 99 mg/dL 286(H) 397(H) 165(H)  BUN 6 - 20 mg/dL 17 35(H) 26(H)  Creatinine 0.44 - 1.00 mg/dL 1.25(H) 1.24(H) 1.24(H)  Sodium 135 - 145 mmol/L 132(L) 129(L) 138  Potassium 3.5 - 5.1 mmol/L 5.0 4.9 4.7  Chloride 98 - 111 mmol/L 101 97(L) 107  CO2 22 - 32 mmol/L '26 24 24  ' Calcium 8.9 - 10.3 mg/dL 8.9 9.7 9.9  Total Protein 6.5 - 8.1 g/dL 5.0(L) 6.0(L) 6.5  Total Bilirubin 0.3 - 1.2 mg/dL <0.2(L) 0.7 0.4  Alkaline Phos 38 - 126 U/L 138(H) 122 85  AST 15 - 41 U/L '16 17 21  ' ALT 0 - 44 U/L 20 24 41      RADIOGRAPHIC STUDIES: I have personally reviewed the radiological images as listed and agreed with the findings in the report. No results found.    No orders of the defined types were placed in this encounter.  All questions were answered. The patient knows to call the  clinic with any problems, questions or concerns. No barriers to learning was detected. The total time spent in the appointment was 20 minutes.     Truitt Merle, MD 05/11/2021   I, Wilburn Mylar, am acting as scribe for Truitt Merle, MD.   I have reviewed the above documentation for accuracy and completeness, and I agree with the above.

## 2021-05-23 MED FILL — Dexamethasone Sodium Phosphate Inj 100 MG/10ML: INTRAMUSCULAR | Qty: 1 | Status: AC

## 2021-05-24 ENCOUNTER — Encounter: Payer: Self-pay | Admitting: Hematology

## 2021-05-24 ENCOUNTER — Ambulatory Visit (HOSPITAL_BASED_OUTPATIENT_CLINIC_OR_DEPARTMENT_OTHER): Payer: Medicare HMO | Admitting: Physician Assistant

## 2021-05-24 ENCOUNTER — Inpatient Hospital Stay: Payer: Medicare HMO

## 2021-05-24 ENCOUNTER — Other Ambulatory Visit: Payer: Self-pay

## 2021-05-24 ENCOUNTER — Inpatient Hospital Stay (HOSPITAL_BASED_OUTPATIENT_CLINIC_OR_DEPARTMENT_OTHER): Payer: Medicare HMO | Admitting: Hematology

## 2021-05-24 VITALS — BP 149/56 | HR 74 | Temp 98.2°F | Resp 18 | Wt 211.0 lb

## 2021-05-24 DIAGNOSIS — Z170 Estrogen receptor positive status [ER+]: Secondary | ICD-10-CM

## 2021-05-24 DIAGNOSIS — C50312 Malignant neoplasm of lower-inner quadrant of left female breast: Secondary | ICD-10-CM | POA: Diagnosis not present

## 2021-05-24 DIAGNOSIS — Z5111 Encounter for antineoplastic chemotherapy: Secondary | ICD-10-CM | POA: Diagnosis not present

## 2021-05-24 DIAGNOSIS — R350 Frequency of micturition: Secondary | ICD-10-CM

## 2021-05-24 DIAGNOSIS — R112 Nausea with vomiting, unspecified: Secondary | ICD-10-CM

## 2021-05-24 LAB — CMP (CANCER CENTER ONLY)
ALT: 37 U/L (ref 0–44)
AST: 21 U/L (ref 15–41)
Albumin: 3.4 g/dL — ABNORMAL LOW (ref 3.5–5.0)
Alkaline Phosphatase: 99 U/L (ref 38–126)
Anion gap: 7 (ref 5–15)
BUN: 20 mg/dL (ref 6–20)
CO2: 24 mmol/L (ref 22–32)
Calcium: 9.3 mg/dL (ref 8.9–10.3)
Chloride: 101 mmol/L (ref 98–111)
Creatinine: 1.16 mg/dL — ABNORMAL HIGH (ref 0.44–1.00)
GFR, Estimated: 57 mL/min — ABNORMAL LOW (ref >60)
Glucose, Bld: 285 mg/dL — ABNORMAL HIGH (ref 70–99)
Potassium: 4.8 mmol/L (ref 3.5–5.1)
Sodium: 132 mmol/L — ABNORMAL LOW (ref 135–145)
Total Bilirubin: 0.5 mg/dL (ref 0.3–1.2)
Total Protein: 5.7 g/dL — ABNORMAL LOW (ref 6.5–8.1)

## 2021-05-24 LAB — CBC WITH DIFFERENTIAL (CANCER CENTER ONLY)
Abs Immature Granulocytes: 0.04 10*3/uL (ref 0.00–0.07)
Basophils Absolute: 0.1 10*3/uL (ref 0.0–0.1)
Basophils Relative: 1 %
Eosinophils Absolute: 0.2 10*3/uL (ref 0.0–0.5)
Eosinophils Relative: 3 %
HCT: 34.7 % — ABNORMAL LOW (ref 36.0–46.0)
Hemoglobin: 11.4 g/dL — ABNORMAL LOW (ref 12.0–15.0)
Immature Granulocytes: 1 %
Lymphocytes Relative: 14 %
Lymphs Abs: 1.2 10*3/uL (ref 0.7–4.0)
MCH: 29.5 pg (ref 26.0–34.0)
MCHC: 32.9 g/dL (ref 30.0–36.0)
MCV: 89.7 fL (ref 80.0–100.0)
Monocytes Absolute: 0.6 10*3/uL (ref 0.1–1.0)
Monocytes Relative: 8 %
Neutro Abs: 6.2 10*3/uL (ref 1.7–7.7)
Neutrophils Relative %: 73 %
Platelet Count: 185 10*3/uL (ref 150–400)
RBC: 3.87 MIL/uL (ref 3.87–5.11)
RDW: 13 % (ref 11.5–15.5)
Smear Review: NORMAL
WBC Count: 8.4 10*3/uL (ref 4.0–10.5)
nRBC: 0 % (ref 0.0–0.2)

## 2021-05-24 LAB — URINALYSIS, COMPLETE (UACMP) WITH MICROSCOPIC
Bilirubin Urine: NEGATIVE
Glucose, UA: >=500 mg/dL — AB
Ketones, ur: NEGATIVE mg/dL
Nitrite: NEGATIVE
Protein, ur: NEGATIVE mg/dL
Specific Gravity, Urine: 1.002 — ABNORMAL LOW (ref 1.005–1.030)
pH: 7 (ref 5.0–8.0)

## 2021-05-24 MED ORDER — LORAZEPAM 2 MG/ML IJ SOLN
INTRAMUSCULAR | Status: AC
  Filled 2021-05-24: qty 1

## 2021-05-24 MED ORDER — SODIUM CHLORIDE 0.9 % IV SOLN: 600.0000 mg/m2 | Freq: Once | INTRAVENOUS | Status: DC

## 2021-05-24 MED ORDER — FAMOTIDINE 20 MG IN NS 100 ML IVPB
20.0000 mg | Freq: Once | INTRAVENOUS | Status: AC | PRN
  Administered 2021-05-24: 20 mg via INTRAVENOUS

## 2021-05-24 MED ORDER — SODIUM CHLORIDE 0.9 % IV SOLN
60.0000 mg/m2 | Freq: Once | INTRAVENOUS | Status: AC
  Administered 2021-05-24: 120 mg via INTRAVENOUS
  Filled 2021-05-24: qty 12

## 2021-05-24 MED ORDER — SODIUM CHLORIDE 0.9 % IV SOLN
500.0000 mg/m2 | Freq: Once | INTRAVENOUS | Status: AC
  Administered 2021-05-24: 1000 mg via INTRAVENOUS
  Filled 2021-05-24: qty 50

## 2021-05-24 MED ORDER — SODIUM CHLORIDE 0.9 % IV SOLN: Freq: Once | INTRAVENOUS | Status: AC

## 2021-05-24 MED ORDER — SODIUM CHLORIDE 0.9 % IV SOLN: 75.0000 mg/m2 | Freq: Once | INTRAVENOUS | Status: DC

## 2021-05-24 MED ORDER — PALONOSETRON HCL INJECTION 0.25 MG/5ML
0.2500 mg | Freq: Once | INTRAVENOUS | Status: AC
  Administered 2021-05-24: 0.25 mg via INTRAVENOUS
  Filled 2021-05-24: qty 5

## 2021-05-24 MED ORDER — LORAZEPAM 2 MG/ML IJ SOLN
0.5000 mg | Freq: Once | INTRAMUSCULAR | Status: AC
  Administered 2021-05-24: 0.5 mg via INTRAVENOUS

## 2021-05-24 MED ORDER — SODIUM CHLORIDE 0.9 % IV SOLN
10.0000 mg | Freq: Once | INTRAVENOUS | Status: AC
  Administered 2021-05-24: 10 mg via INTRAVENOUS
  Filled 2021-05-24: qty 10

## 2021-05-24 MED ORDER — SODIUM CHLORIDE 0.9 % IV SOLN
150.0000 mg | Freq: Once | INTRAVENOUS | Status: AC
  Administered 2021-05-24: 150 mg via INTRAVENOUS
  Filled 2021-05-24: qty 150

## 2021-05-24 NOTE — Progress Notes (Signed)
Decrease chemo to docetaxel 60mg /m2 and cyclophosphamide 500mg /m2 per Dr Burr Medico

## 2021-05-24 NOTE — Progress Notes (Signed)
Verbal orders given by Dr.Feng to order UA.

## 2021-05-24 NOTE — Progress Notes (Signed)
DATE:  05/24/21                                        X CHEMO/IMMUNOTHERAPY REACTION           MD: Burr Medico   AGENT/BLOOD PRODUCT RECEIVING TODAY:    day 1 cycle 2 Docetaxel (taxotere)             AGENT/BLOOD PRODUCT RECEIVING IMMEDIATELY PRIOR TO REACTION:          Docetaxel   VS: BP:     157/63   P:       74       SPO2:       100 %                BP:     151/59   P:       75       SPO2:       100%   REACTION(S):           nausea and diaphoresis   PREMEDS:     Decadron 10 mg, Emend 150 mg, Aloxi 0.25 mg   INTERVENTION: Pepcid 20 mg, Ativan 0.5 mg, IVF   Review of Systems  Review of Systems  Constitutional:  Positive for diaphoresis.  HENT:  Negative for facial swelling.   Respiratory:  Negative for chest tightness, shortness of breath and wheezing.   Cardiovascular:  Negative for chest pain, palpitations and leg swelling.  Gastrointestinal:  Positive for abdominal pain and nausea. Negative for vomiting.  Endocrine: Negative for cold intolerance.  Musculoskeletal:  Negative for arthralgias and myalgias.  Skin:  Negative for color change and rash.  Neurological:  Negative for dizziness, speech difficulty, numbness and headaches.    Physical Exam  Physical Exam Vitals and nursing note reviewed.  Constitutional:      Appearance: She is well-developed. She is diaphoretic. She is not ill-appearing or toxic-appearing.  HENT:     Head: Normocephalic and atraumatic.     Nose: Nose normal.  Eyes:     General: No scleral icterus.       Right eye: No discharge.        Left eye: No discharge.     Conjunctiva/sclera: Conjunctivae normal.  Neck:     Vascular: No JVD.  Cardiovascular:     Rate and Rhythm: Normal rate and regular rhythm.     Pulses: Normal pulses.     Heart sounds: Normal heart sounds.  Pulmonary:     Effort: Pulmonary effort is normal.     Breath sounds: Normal breath sounds.  Abdominal:     General: Bowel sounds are normal. There is no distension.      Palpations: Abdomen is soft. There is no mass.     Tenderness: There is no right CVA tenderness or left CVA tenderness.     Hernia: No hernia is present.     Comments: Epigastric tenderness on exam patient states is chronic. No peritoneal signs  Musculoskeletal:        General: Normal range of motion.     Cervical back: Normal range of motion.  Skin:    General: Skin is warm.     Capillary Refill: Capillary refill takes less than 2 seconds.     Findings: No erythema or rash.  Neurological:     Mental Status: She is oriented to person,  place, and time.     GCS: GCS eye subscore is 4. GCS verbal subscore is 5. GCS motor subscore is 6.     Comments: Fluent speech, no facial droop.  Psychiatric:        Behavior: Behavior normal.    OUTCOME:     Patient anxious appearing. Had difficult time getting IV today, took multiple attempts. Symptoms resolved after interventions. Discussed with oncologist who agrees to proceed with treatment as patient is back to baseline. Patient tolerated remainder of treatment without complications.

## 2021-05-24 NOTE — Patient Instructions (Signed)
[facility] MEDICAL [facility]COLOGY  Discharge Instructions: Thank you for choosing [facility] to provide your oncology and hematology care.   If you have a lab appointment with the Cancer Center, please go directly to the Cancer Center and check in at the registration area.   Wear comfortable clothing and clothing appropriate for easy access to any Portacath or PICC line.   We strive to give you quality time with your provider. You may need to reschedule your appointment if you arrive late (15 or more minutes).  Arriving late affects you and other patients whose appointments are after yours.  Also, if you miss three or more appointments without notifying the office, you may be dismissed from the clinic at the provider's discretion.      For prescription refill requests, have your pharmacy contact our office and allow 72 hours for refills to be completed.    Today you received the following chemotherapy and/or immunotherapy agents : Taxotere, Cytoxan     To help prevent nausea and vomiting after your treatment, we encourage you to take your nausea medication as directed.  BELOW ARE SYMPTOMS THAT SHOULD BE REPORTED IMMEDIATELY: *FEVER GREATER THAN 100.4 F (38 C) OR HIGHER *CHILLS OR SWEATING *NAUSEA AND VOMITING THAT IS NOT CONTROLLED WITH YOUR NAUSEA MEDICATION *UNUSUAL SHORTNESS OF BREATH *UNUSUAL BRUISING OR BLEEDING *URINARY PROBLEMS (pain or burning when urinating, or frequent urination) *BOWEL PROBLEMS (unusual diarrhea, constipation, pain near the anus) TENDERNESS IN MOUTH AND THROAT WITH OR WITHOUT PRESENCE OF ULCERS (sore throat, sores in mouth, or a toothache) UNUSUAL RASH, SWELLING OR PAIN  UNUSUAL VAGINAL DISCHARGE OR ITCHING   Items with * indicate a potential emergency and should be followed up as soon as possible or go to the Emergency Department if any problems should occur.  Please show the CHEMOTHERAPY ALERT CARD or IMMUNOTHERAPY ALERT CARD at  check-in to the Emergency Department and triage nurse.  Should you have questions after your visit or need to cancel or reschedule your appointment, please contact [facility]  Dept: [phone]  and follow the prompts.  Office hours are 8:00 a.m. to 4:30 p.m. Monday - Friday. Please note that voicemails left after 4:00 p.m. may not be returned until the following business day.  We are closed weekends and major holidays. You have access to a nurse at all times for urgent questions. Please call the main number to the clinic Dept: [phone] and follow the prompts.   For any non-urgent questions, you may also contact your provider using MyChart. We now offer e-Visits for anyone 18 and older to request care online for non-urgent symptoms. For details visit [email or web address].   Also download the MyChart app! Go to the app store, search "MyChart", open the app, select [facility], and log in with your MyChart username and password.  Due to Covid, a mask is required upon entering the hospital/clinic. If you do not have a mask, one will be given to you upon arrival. For doctor visits, patients may have 1 support person aged 18 or older with them. For treatment visits, patients cannot have anyone with them due to current Covid guidelines and our immunocompromised population.   

## 2021-05-24 NOTE — Progress Notes (Signed)
[place]   [phone] [phone]   Clinic Follow up Note   Patient Care Team: Cynthia Holms, NP as PCP - General (Nurse Practitioner) Cynthia Dresser, MD as PCP - Cardiology (Cardiology) Cynthia Agent, MD as Consulting Physician (Nephrology) Cynthia Melena, MD as Referring Physician (Nephrology) Cynthia Germany, RN as Oncology Nurse Navigator Cynthia Kaufmann, RN as Oncology Nurse Navigator Cynthia Merle, MD as Consulting Physician (Hematology) Cynthia Luna, MD as Consulting Physician (General Surgery) Cynthia Gibson, MD as Attending Physician (Radiation Oncology)  Date of Service:  05/24/2021  CHIEF COMPLAINT: f/u of left breast cancer  CURRENT THERAPY:  adjuvant TC q21 days x4 cycles starting 05/02/21  ASSESSMENT & PLAN:  Cynthia [name] is a 50 y.o. female with   1. 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 Brantley Stage 03/02/2021, surgical path showed invasive and in situ ductal carcinoma, grade 2, node-negative with close but clear margins.  ER/PR positive, HER2 negative, Ki-67 of 20%.  Oncotype showed high risk with recurrence score 26 which predicts 9-year distant recurrence after 5 years of tamoxifen at 16% -She began adjuvant TC on 05/02/21 after delayed wound healing. She tolerated C1 poorly with nausea with vomiting and diarrhea. She was able to recover well.  -labs reviewed, adequate to proceed with C2 today, will continue the same dose reduction due to her poor tolerance -will add iv emend before chemo   2. Symptom Management: nausea with vomiting, diarrhea, dysuria -secondary to TC, developed 4 days after infusion -positive for E coli on 05/09/21 -nausea/vomiting and diarrhea resolved. -I will prescribe lomotil for her to use as needed -she also notes new dysuria that started last night. I will order UA to be done today.    3. Comorbidities: DM with neuropathy, HTN, s/p kidney transplant -continue medications and f/u -she has diabetic neuropathy at baseline, on gabapentin -On prednisone and tacrolimus following right kidney transplant in 2017.  I will send a message to nephrologist to inform them that we are starting chemo   4. Covid-19+ 05/2019 -she received booster recently    5. BRCA2 + pathogenic mutation  -Found on BRCA1/2 analysis and [facility] genetics panel from 02/22/2021 -We reviewed the increased risk of recurrent breast and or contralateral breast cancer, ovarian cancer, pancreatic cancer, and melanoma -She is reluctant to consider another breast surgery at this time given her delayed healing from lumpectomy.  If she is not interested in risk reducing mastectomy, we would likely recommend close surveillance with annual mammogram and screening breast MRI in the future -She is s/p hysterectomy in 2001 at which time she became menopausal, ovaries remain.  She is open to oophorectomy, plan to refer her to gyn onc after she completes chemo to discuss screening vs risk reducing surgery. If she does not proceed with surgery soon we may start her on ovarian suppression after chemo -she has no family history of pancreatic cancer, we reviewed screening options. I will likely refer her to GI for discussion of EUS vs MRI and tumor markers after chemo -she has no personal h/o skin cancer, I will refer her to derm for screening after chemo -her 3 daughters have been tested and all are negative.     PLAN: -proceed with C2 TC today with same dose reduction  -UA and urine culture today -return for f/u with Cynthia [name], and IVF early next week  -IVF and Udenyca 05/26/21 -  lab, f/u with NP Cynthia [name], and C3 TC as scheduled 06/14/21  -Udenyca on day 3   No problem-specific Assessment & Plan notes found for this encounter.   SUMMARY OF ONCOLOGIC HISTORY: Oncology History Overview Note  Cancer Staging Malignant neoplasm of  lower-inner quadrant of left breast in female, estrogen receptor positive ([place]) Staging form: Breast, AJCC 8th Edition - Clinical stage from 02/09/2021: Stage IA (cT1c, cN0, cM0, G2, ER+, PR+, HER2-) - Signed by Cynthia Merle, MD on 02/20/2021 Stage prefix: Initial diagnosis Histologic grading system: 3 grade system - Pathologic stage from 03/02/2021: Stage IA (pT2, pN0, cM0, G2, ER+, PR+, HER2-, Oncotype DX score: 26) - Signed by Cynthia Merle, MD on 03/21/2021 Stage prefix: Initial diagnosis Multigene prognostic tests performed: Oncotype DX Recurrence score range: Greater than or equal to 11 Histologic grading system: 3 grade system Residual tumor (R): R0 - None    Malignant neoplasm of lower-inner quadrant of left breast in female, estrogen receptor positive ([place])  02/07/2021 Mammogram   Exam: Left Diagnostic Mammogram; Left Breast Ultrasound  IMPRESSION: Irregular mass in left breast at 6:30, measuring 1.6 cm by mammogram, is highly suggestive of malignancy. Left axillary ultrasound is negative for lymphadenopathy.   02/09/2021 Cancer Staging   Staging form: Breast, AJCC 8th Edition - Clinical stage from 02/09/2021: Stage IA (cT1c, cN0, cM0, G2, ER+, PR+, HER2-) - Signed by Cynthia Merle, MD on 02/20/2021 Stage prefix: Initial diagnosis Histologic grading system: 3 grade system    02/09/2021 Pathology Results   Diagnosis Breast, left, needle core biopsy, 6:30 o'clock 8cm fn - INVASIVE DUCTAL CARCINOMA WITH HISTIOCYTOID FEATURES. SEE NOTE Diagnosis Note Carcinoma measures 1 cm in greatest linear dimension and appears grade 2.  PROGNOSTIC INDICATORS Results: IMMUNOHISTOCHEMICAL AND MORPHOMETRIC ANALYSIS PERFORMED MANUALLY The tumor cells are EQUIVOCAL for Her2 (2+). Her2 by FISH will be performed and results performed separately. Estrogen Receptor: >95%, POSITIVE, STRONG STAINING INTENSITY Progesterone Receptor: 90%, POSITIVE, STRONG STAINING INTENSITY Proliferation Marker Ki67:  20%  FLUORESCENCE IN-SITU HYBRIDIZATION Results: GROUP 5: HER2 **NEGATIVE** Equivocal form of amplification of the HER2 gene was detected in the IHC 2+ tissue sample received from this individual. HER2 FISH was performed by a technologist and cell imaging and analysis on the [employer]. RATIO OF HER2/CEN17 SIGNALS 1.30 AVERAGE HER2 COPY NUMBER PER CELL 1.75   02/15/2021 Initial Diagnosis   Malignant neoplasm of lower-inner quadrant of left breast in female, estrogen receptor positive ([place])   03/01/2021 Genetic Testing   Positive genetic testing: pathogenic variant detected in BRCA2 at [id number].  No other pathogenic variants detected in [facility] Custom[employer] Panel.  The report date is 03/19/2021.  The CustomNext-Cancer+RNAinsight panel offered by [facility] includes sequencing and rearrangement analysis for the following 47 genes:  APC, ATM, AXIN2, BARD1, BMPR1A, BRCA1, BRCA2, BRIP1, CDH1, CDK4, CDKN2A, CHEK2, DICER1, EPCAM, GREM1, HOXB13, MEN1, MLH1, MSH2, MSH3, MSH6, MUTYH, NBN, NF1, NF2, NTHL1, PALB2, PMS2, POLD1, POLE, PTEN, RAD51C, RAD51D, RECQL, RET, SDHA, SDHAF2, SDHB, SDHC, SDHD, SMAD4, SMARCA4, STK11, TP53, TSC1, TSC2, and VHL.  RNA data is routinely analyzed for use in variant interpretation for all genes.   03/02/2021 Cancer Staging   Staging form: Breast, AJCC 8th Edition - Pathologic stage from 03/02/2021: Stage IA (pT2, pN0, cM0, G2, ER+, PR+, HER2-, Oncotype DX score: 26) - Signed by Cynthia Merle, MD on 03/21/2021 Stage prefix: Initial diagnosis Multigene prognostic tests performed: Oncotype DX Recurrence score range: Greater than or equal to 11 Histologic grading system: 3 grade system Residual tumor (R): R0 - None  05/02/2021 -  Chemotherapy   Patient is on Treatment Plan : BREAST TC q21d        INTERVAL HISTORY:  Cynthia [name] is here for a follow up of breast cancer. She was last seen by me on 05/11/21. She was seen in the infusion area. She reports she has recovered  well from her last infusion. She does reports new dysuria that started last night.  She also reports deep pain to the inner part of her left breast. She denies skin redness. She also notes some irritation to her old cholecystectomy scar.   All other systems were reviewed with the patient and are negative.  MEDICAL HISTORY:  Past Medical History:  Diagnosis Date   Allergy    pollen   Anemia    when on dialysis   Anxiety    BRCA2 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 03/01/2021   Breast cancer [facility])    Cataract    surgical repair bilateral   Depression    ESRD on hemodialysis ([place])    Home HD 5x per week- not on dialysis now had tramsplant 4/17   Family history of ovarian cancer 02/22/2021   GERD (gastroesophageal reflux disease)    Hearing loss 2017   right ear   History of blood transfusion    transfusion reaction   Hyperlipidemia    Hypertension    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 per pt-dr levy also II   Kidney transplant recipient    Neuromuscular disorder ([place])    NEUROPATHY   Sleep apnea     SURGICAL HISTORY: Past Surgical History:  Procedure Laterality Date   ABDOMINAL HYSTERECTOMY     BREAST EXCISIONAL BIOPSY Right 08/2015   BREAST LUMPECTOMY WITH RADIOACTIVE SEED LOCALIZATION Right 09/14/2015   Procedure: RIGHT BREAST LUMPECTOMY WITH RADIOACTIVE SEED LOCALIZATION;  Surgeon: Donnie Mesa, MD;  Location: [facility];  Service: General;  Laterality: Right;   BREAST LUMPECTOMY WITH RADIOACTIVE SEED LOCALIZATION Left 03/02/2021   Procedure: LEFT BREAST SEED LUMPECTOMY LEFT SENTINEL LYMPH NODE [place];  Surgeon: Cynthia Luna, MD;  Location: [facility];  Service: General;  Laterality: Left;  GEN AND PEC BLOCK   BREAST SURGERY Bilateral    biopsy bilateral   CATARACT EXTRACTION Bilateral    bilateral   CHOLECYSTECTOMY     CYST REMOVAL NECK     DIALYSIS FISTULA CREATION Left    EYE  SURGERY Bilateral    lazer   LEFT HEART CATH AND CORONARY ANGIOGRAPHY N/A 03/04/2019   Procedure: LEFT HEART CATH AND CORONARY ANGIOGRAPHY;  Surgeon: Martinique, Peter M, MD;  Location: [facility];  Service: Cardiovascular;  Laterality: N/A;   LEFT HEART CATHETERIZATION WITH CORONARY ANGIOGRAM N/A 03/30/2014   Procedure: LEFT HEART CATHETERIZATION WITH CORONARY ANGIOGRAM;  Surgeon: Sinclair Grooms, MD;  Location: [facility] CATH LAB;  Service: Cardiovascular;  Laterality: N/A;   LIPOMA EXCISION N/A 02/06/2017   Procedure: EXCISION POSTERIOR NECK SEBACEOUS CYST;  Surgeon: Coralie Keens, MD;  Location: [facility];  Service: General;  Laterality: N/A;   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   Procedure: REPAIR OF ARTERIOVENOUS FISTULA ANEURYSM;  Surgeon: Serafina Mitchell, MD;  Location: [facility] OR;  Service: Vascular;  Laterality: Left;   REVISON OF ARTERIOVENOUS FISTULA Left 09/28/2013   Procedure: EXCISE ESCHAR LEFT ARM  ARTERIOVENOUS FISTULA WITH PLICATION OF LEFT ARM ARTERIOVENOUS FISTULA;  Surgeon: Elam Dutch, MD;  [facility]  OR;  Service: Vascular;  Laterality: Left;   REVISON OF ARTERIOVENOUS FISTULA Left 08/26/2015   Procedure: RESECTION ANEURYSM OF LEFT ARM ARTERIOVENOUS FISTULA  ;  Surgeon: Serafina Mitchell, MD;  Location: [facility];  Service: Vascular;  Laterality: Left;   TUBAL LIGATION      I have reviewed the social history and family history with the patient and they are unchanged from previous note.  ALLERGIES:  is allergic to other, docetaxel, norvasc [amlodipine], and tape.  MEDICATIONS:  Current Outpatient Medications  Medication Sig Dispense Refill   Accu-Chek Softclix Lancets lancets Use as instructed 100 each 12   aspirin EC 81 MG EC tablet Take 1 tablet (81 mg total) by mouth daily. (Patient taking differently: Take 81 mg by mouth at bedtime.) 90 tablet 3   B Complex-C (SUPER B COMPLEX PO) Take 1 tablet by mouth daily.      Blood Glucose Monitoring Suppl  (ACCU-CHEK AVIVA CONNECT) w/Device KIT Check sugar 1x daily 1 kit 0   carvedilol (COREG) 25 MG tablet Take 12.5 mg by mouth 2 (two) times daily with a meal.      cetirizine (ZYRTEC) 10 MG tablet Take 5 mg by mouth daily. Takes 0.5 tablet daily     Cholecalciferol (VITAMIN D3) 1000 units CAPS Take 1,000 Units by mouth daily.      dexamethasone (DECADRON) 4 MG tablet Take 2 tablets (8 mg total) by mouth 2 (two) times daily. Start the day before Taxotere. Then again the day after chemo for 3 days. 30 tablet 1   fludrocortisone (FLORINEF) 0.1 MG tablet Take 0.1 mg by mouth daily.     FLUoxetine (PROZAC) 40 MG capsule Take 40 mg by mouth daily.     furosemide (LASIX) 40 MG tablet Take 40 mg by mouth 2 (two) times daily.     gabapentin (NEURONTIN) 300 MG capsule Take 300 mg by mouth at bedtime.  12   glucose blood (ACCU-CHEK AVIVA PLUS) test strip Check sugar 1x daily 100 each 12   ibuprofen (ADVIL) 800 MG tablet Take 1 tablet (800 mg total) by mouth every 8 (eight) hours as needed. 30 tablet 0   insulin glargine (LANTUS SOLOSTAR) 100 UNIT/ML Solostar Pen Inject 65 Units into the skin every morning. 75 mL PRN   Insulin Pen Needle (PEN NEEDLES) 30G X 8 MM MISC 1 each by Does not apply route daily. E11.9 90 each 0   loperamide (IMODIUM) 2 MG capsule Take 1 capsule (2 mg total) by mouth 4 (four) times daily as needed for diarrhea or loose stools. 12 capsule 0   magnesium oxide (MAG-OX) 400 MG tablet Take 400 mg by mouth 2 (two) times daily.      mycophenolate (MYFORTIC) 180 MG EC tablet Take 360 mg by mouth 2 (two) times daily.     ondansetron (ZOFRAN) 8 MG tablet Take 1 tablet (8 mg total) by mouth 2 (two) times daily as needed for refractory nausea / vomiting. Start on day 3 after chemo. 30 tablet 1   oxyCODONE (OXY IR/ROXICODONE) 5 MG immediate release tablet Take 1 tablet (5 mg total) by mouth every 6 (six) hours as needed for severe pain. 15 tablet 0   pantoprazole (PROTONIX) 40 MG tablet Take 40 mg by  mouth daily.  5   predniSONE (DELTASONE) 5 MG tablet Take 5 mg by mouth daily.   2   prochlorperazine (COMPAZINE) 10 MG tablet Take 1 tablet (10 mg total) by mouth every 6 (six) hours as  needed (Nausea or vomiting). 30 tablet 1   rosuvastatin (CRESTOR) 5 MG tablet Take 5 mg by mouth at bedtime.     Tacrolimus ER (ENVARSUS XR) 1 MG TB24 Take 3 mg by mouth every morning.     vitamin E 400 UNIT capsule Take 400 Units by mouth daily.     No current facility-administered medications for this visit.   Facility-Administered Medications Ordered in Other Visits  Medication Dose Route Frequency Provider Last Rate Last Admin   LORazepam (ATIVAN) 2 MG/ML injection             PHYSICAL EXAMINATION: ECOG PERFORMANCE STATUS: 1 - Symptomatic but completely ambulatory  There were no vitals filed for this visit. Wt Readings from Last 3 Encounters:  05/24/21 211 lb (95.7 kg)  05/11/21 209 lb (94.8 kg)  05/08/21 215 lb (97.5 kg)     GENERAL:alert, no distress and comfortable SKIN: skin color normal, no rashes or significant lesions EYES: normal, Conjunctiva are pink and non-injected, sclera clear  NEURO: alert & oriented x 3 with fluent speech  LABORATORY DATA:  I have reviewed the data as listed CBC Latest Ref Rng & Units 05/24/2021 05/11/2021 05/08/2021  WBC 4.0 - 10.5 K/uL 8.4 27.9(H) 9.1  Hemoglobin 12.0 - 15.0 g/dL 11.4(L) 12.5 14.1  Hematocrit 36.0 - 46.0 % 34.7(L) 38.2 41.9  Platelets 150 - 400 K/uL 185 116(L) 108(L)     CMP Latest Ref Rng & Units 05/24/2021 05/11/2021 05/08/2021  Glucose 70 - 99 mg/dL 285(H) 286(H) 397(H)  BUN 6 - 20 mg/dL 20 17 35(H)  Creatinine 0.44 - 1.00 mg/dL 1.16(H) 1.25(H) 1.24(H)  Sodium 135 - 145 mmol/L 132(L) 132(L) 129(L)  Potassium 3.5 - 5.1 mmol/L 4.8 5.0 4.9  Chloride 98 - 111 mmol/L 101 101 97(L)  CO2 22 - 32 mmol/L _0 Calcium 8.9 - 10.3 mg/dL 9.3 8.9 9.7  Total Protein 6.5 - 8.1 g/dL 5.7(L) 5.0(L) 6.0(L)  Total Bilirubin 0.3 - 1.2 mg/dL 0.5  <0.2(L) 0.7  Alkaline Phos 38 - 126 U/L 99 138(H) 122  AST 15 - 41 U/L _1 ALT 0 - 44 U/L 37 20 24      RADIOGRAPHIC STUDIES: I have personally reviewed the radiological images as listed and agreed with the findings in the report. No results found.    No orders of the defined types were placed in this encounter.  All questions were answered. The patient knows to call the clinic with any problems, questions or concerns. No barriers to learning was detected. The total time spent in the appointment was 30 minutes.     Cynthia Merle, MD 05/24/2021   I, Wilburn Mylar, am acting as scribe for Cynthia Merle, MD.   I have reviewed the above documentation for accuracy and completeness, and I agree with the above.

## 2021-05-24 NOTE — Progress Notes (Signed)
Hypersensitivity Reaction note  Date of event: 05/24/21 Time of event: 1508 Generic name of drug involved: docetaxel Name of provider notified of the hypersensitivity reaction: Jeanelle Malling, PA/Dr. Burr Medico. Was agent that likely caused hypersensitivity reaction added to Allergies List within EMR? yes Chain of events including reaction signs/symptoms, treatment administered, and outcome (e.g., drug resumed; drug discontinued; sent to Emergency Department; etc.) Patient notified RN of nausea at 1508.  Docetaxel infusion stopped, NS started at rate of 977ml/hr.  RN notified PA of reaction.  RN administered Pepcid 20mg  IVPB at 1511.  VSS.  PA arrived and assessed patient.  Patient c/o of nausea after completion of Pepcid.  PA ordered Ativan 0.5mg , RN administered at [employer].  Vitals remained stable.  Patient stated nausea was subsiding.  PA notified Dr. Burr Medico of reaction.  Per Katelyn/Dr. Burr Medico okay to continue with docetaxel if patient is no longer nauseated.  Vitals taken again and stable.  RN restarted docetaxel at 1542.  Patient was able to complete infusion without further complications.  Wylene Men, RN 05/24/2021 4:44 PM

## 2021-05-24 NOTE — Progress Notes (Signed)
Pt is approved for the $1000 [employer] grant.  

## 2021-05-26 ENCOUNTER — Ambulatory Visit: Payer: Medicare HMO

## 2021-05-26 ENCOUNTER — Inpatient Hospital Stay: Payer: Medicare HMO | Attending: Hematology

## 2021-05-26 ENCOUNTER — Other Ambulatory Visit: Payer: Self-pay

## 2021-05-26 ENCOUNTER — Telehealth: Payer: Self-pay | Admitting: Hematology

## 2021-05-26 VITALS — BP 172/69 | HR 82 | Temp 98.1°F | Resp 17

## 2021-05-26 DIAGNOSIS — C50312 Malignant neoplasm of lower-inner quadrant of left female breast: Secondary | ICD-10-CM | POA: Insufficient documentation

## 2021-05-26 DIAGNOSIS — N390 Urinary tract infection, site not specified: Secondary | ICD-10-CM | POA: Diagnosis not present

## 2021-05-26 DIAGNOSIS — Z5111 Encounter for antineoplastic chemotherapy: Secondary | ICD-10-CM | POA: Insufficient documentation

## 2021-05-26 DIAGNOSIS — T8613 Kidney transplant infection: Secondary | ICD-10-CM | POA: Diagnosis not present

## 2021-05-26 DIAGNOSIS — Z5189 Encounter for other specified aftercare: Secondary | ICD-10-CM | POA: Insufficient documentation

## 2021-05-26 DIAGNOSIS — Z170 Estrogen receptor positive status [ER+]: Secondary | ICD-10-CM

## 2021-05-26 DIAGNOSIS — R112 Nausea with vomiting, unspecified: Secondary | ICD-10-CM

## 2021-05-26 MED ORDER — SODIUM CHLORIDE 0.9 % IV SOLN: Freq: Once | INTRAVENOUS | Status: AC

## 2021-05-26 MED ORDER — PEGFILGRASTIM-CBQV 6 MG/0.6ML ~~LOC~~ SOSY
6.0000 mg | PREFILLED_SYRINGE | Freq: Once | SUBCUTANEOUS | Status: AC
  Administered 2021-05-26: 6 mg via SUBCUTANEOUS
  Filled 2021-05-26: qty 0.6

## 2021-05-26 NOTE — Patient Instructions (Signed)

## 2021-05-26 NOTE — Telephone Encounter (Signed)
Left message with follow-up appointment per 11/30 los.

## 2021-05-27 ENCOUNTER — Encounter (HOSPITAL_COMMUNITY): Payer: Self-pay

## 2021-05-27 ENCOUNTER — Inpatient Hospital Stay (HOSPITAL_COMMUNITY)
Admission: EM | Admit: 2021-05-27 | Discharge: 2021-06-03 | DRG: 698 | Disposition: A | Discharge status: Home / Self Care | Payer: Medicare HMO | Attending: Internal Medicine | Admitting: Internal Medicine

## 2021-05-27 ENCOUNTER — Other Ambulatory Visit: Payer: Self-pay

## 2021-05-27 DIAGNOSIS — K219 Gastro-esophageal reflux disease without esophagitis: Secondary | ICD-10-CM | POA: Diagnosis present

## 2021-05-27 DIAGNOSIS — F32A Depression, unspecified: Secondary | ICD-10-CM | POA: Diagnosis present

## 2021-05-27 DIAGNOSIS — Z7982 Long term (current) use of aspirin: Secondary | ICD-10-CM

## 2021-05-27 DIAGNOSIS — T451X5A Adverse effect of antineoplastic and immunosuppressive drugs, initial encounter: Secondary | ICD-10-CM | POA: Diagnosis present

## 2021-05-27 DIAGNOSIS — Z79899 Other long term (current) drug therapy: Secondary | ICD-10-CM

## 2021-05-27 DIAGNOSIS — E1022 Type 1 diabetes mellitus with diabetic chronic kidney disease: Secondary | ICD-10-CM | POA: Diagnosis present

## 2021-05-27 DIAGNOSIS — Z794 Long term (current) use of insulin: Secondary | ICD-10-CM

## 2021-05-27 DIAGNOSIS — E109 Type 1 diabetes mellitus without complications: Secondary | ICD-10-CM | POA: Diagnosis present

## 2021-05-27 DIAGNOSIS — D61810 Antineoplastic chemotherapy induced pancytopenia: Secondary | ICD-10-CM | POA: Diagnosis present

## 2021-05-27 DIAGNOSIS — Z940 Kidney transplant status: Secondary | ICD-10-CM

## 2021-05-27 DIAGNOSIS — E1065 Type 1 diabetes mellitus with hyperglycemia: Secondary | ICD-10-CM | POA: Diagnosis present

## 2021-05-27 DIAGNOSIS — Z833 Family history of diabetes mellitus: Secondary | ICD-10-CM

## 2021-05-27 DIAGNOSIS — E871 Hypo-osmolality and hyponatremia: Secondary | ICD-10-CM | POA: Diagnosis present

## 2021-05-27 DIAGNOSIS — T8613 Kidney transplant infection: Principal | ICD-10-CM | POA: Diagnosis present

## 2021-05-27 DIAGNOSIS — Z888 Allergy status to other drugs, medicaments and biological substances status: Secondary | ICD-10-CM

## 2021-05-27 DIAGNOSIS — I129 Hypertensive chronic kidney disease with stage 1 through stage 4 chronic kidney disease, or unspecified chronic kidney disease: Secondary | ICD-10-CM | POA: Diagnosis present

## 2021-05-27 DIAGNOSIS — Z170 Estrogen receptor positive status [ER+]: Secondary | ICD-10-CM

## 2021-05-27 DIAGNOSIS — Z9221 Personal history of antineoplastic chemotherapy: Secondary | ICD-10-CM

## 2021-05-27 DIAGNOSIS — N1831 Chronic kidney disease, stage 3a: Secondary | ICD-10-CM | POA: Diagnosis present

## 2021-05-27 DIAGNOSIS — I10 Essential (primary) hypertension: Secondary | ICD-10-CM | POA: Diagnosis present

## 2021-05-27 DIAGNOSIS — D849 Immunodeficiency, unspecified: Secondary | ICD-10-CM | POA: Diagnosis present

## 2021-05-27 DIAGNOSIS — C50312 Malignant neoplasm of lower-inner quadrant of left female breast: Secondary | ICD-10-CM | POA: Diagnosis present

## 2021-05-27 DIAGNOSIS — Z6841 Body Mass Index (BMI) 40.0 and over, adult: Secondary | ICD-10-CM

## 2021-05-27 DIAGNOSIS — R7881 Bacteremia: Secondary | ICD-10-CM | POA: Diagnosis present

## 2021-05-27 DIAGNOSIS — N390 Urinary tract infection, site not specified: Secondary | ICD-10-CM | POA: Diagnosis present

## 2021-05-27 DIAGNOSIS — T8619 Other complication of kidney transplant: Secondary | ICD-10-CM | POA: Diagnosis present

## 2021-05-27 DIAGNOSIS — Z20822 Contact with and (suspected) exposure to covid-19: Secondary | ICD-10-CM | POA: Diagnosis present

## 2021-05-27 DIAGNOSIS — Z8249 Family history of ischemic heart disease and other diseases of the circulatory system: Secondary | ICD-10-CM

## 2021-05-27 DIAGNOSIS — Y830 Surgical operation with transplant of whole organ as the cause of abnormal reaction of the patient, or of later complication, without mention of misadventure at the time of the procedure: Secondary | ICD-10-CM | POA: Diagnosis present

## 2021-05-27 DIAGNOSIS — Z91048 Other nonmedicinal substance allergy status: Secondary | ICD-10-CM

## 2021-05-27 DIAGNOSIS — Z841 Family history of disorders of kidney and ureter: Secondary | ICD-10-CM

## 2021-05-27 DIAGNOSIS — B9620 Unspecified Escherichia coli [E. coli] as the cause of diseases classified elsewhere: Secondary | ICD-10-CM | POA: Diagnosis present

## 2021-05-27 DIAGNOSIS — D696 Thrombocytopenia, unspecified: Secondary | ICD-10-CM | POA: Diagnosis present

## 2021-05-27 DIAGNOSIS — N12 Tubulo-interstitial nephritis, not specified as acute or chronic: Secondary | ICD-10-CM | POA: Diagnosis present

## 2021-05-27 DIAGNOSIS — N179 Acute kidney failure, unspecified: Secondary | ICD-10-CM | POA: Diagnosis present

## 2021-05-27 DIAGNOSIS — E861 Hypovolemia: Secondary | ICD-10-CM | POA: Diagnosis present

## 2021-05-27 LAB — CBC WITH DIFFERENTIAL/PLATELET
Abs Immature Granulocytes: 1.63 10*3/uL — ABNORMAL HIGH (ref 0.00–0.07)
Basophils Absolute: 0.1 10*3/uL (ref 0.0–0.1)
Basophils Relative: 1 %
Eosinophils Absolute: 0 10*3/uL (ref 0.0–0.5)
Eosinophils Relative: 0 %
HCT: 35.4 % — ABNORMAL LOW (ref 36.0–46.0)
Hemoglobin: 11.8 g/dL — ABNORMAL LOW (ref 12.0–15.0)
Immature Granulocytes: 18 %
Lymphocytes Relative: 2 %
Lymphs Abs: 0.2 10*3/uL — ABNORMAL LOW (ref 0.7–4.0)
MCH: 30.1 pg (ref 26.0–34.0)
MCHC: 33.3 g/dL (ref 30.0–36.0)
MCV: 90.3 fL (ref 80.0–100.0)
Monocytes Absolute: 0.1 10*3/uL (ref 0.1–1.0)
Monocytes Relative: 1 %
Neutro Abs: 7 10*3/uL (ref 1.7–7.7)
Neutrophils Relative %: 78 %
Platelets: 92 10*3/uL — ABNORMAL LOW (ref 150–400)
RBC: 3.92 MIL/uL (ref 3.87–5.11)
RDW: 13.4 % (ref 11.5–15.5)
WBC: 9 10*3/uL (ref 4.0–10.5)
nRBC: 0 % (ref 0.0–0.2)

## 2021-05-27 LAB — COMPREHENSIVE METABOLIC PANEL
ALT: 26 U/L (ref 0–44)
AST: 14 U/L — ABNORMAL LOW (ref 15–41)
Albumin: 3.5 g/dL (ref 3.5–5.0)
Alkaline Phosphatase: 114 U/L (ref 38–126)
Anion gap: 7 (ref 5–15)
BUN: 36 mg/dL — ABNORMAL HIGH (ref 6–20)
CO2: 22 mmol/L (ref 22–32)
Calcium: 9.2 mg/dL (ref 8.9–10.3)
Chloride: 99 mmol/L (ref 98–111)
Creatinine, Ser: 1.21 mg/dL — ABNORMAL HIGH (ref 0.44–1.00)
GFR, Estimated: 54 mL/min — ABNORMAL LOW (ref >60)
Glucose, Bld: 326 mg/dL — ABNORMAL HIGH (ref 70–99)
Potassium: 4.9 mmol/L (ref 3.5–5.1)
Sodium: 128 mmol/L — ABNORMAL LOW (ref 135–145)
Total Bilirubin: 0.7 mg/dL (ref 0.3–1.2)
Total Protein: 6 g/dL — ABNORMAL LOW (ref 6.5–8.1)

## 2021-05-27 NOTE — ED Triage Notes (Addendum)
Patient having generalized body pain. Her daughter checked her temperature it was 100.52F. Patient took oxycodone for her pain around 9pm, and Tylenol at 4pm Patient going through chemo, last treatment was on Wednesday. Patient has stage 1 breast cancer.

## 2021-05-28 ENCOUNTER — Encounter (HOSPITAL_COMMUNITY): Payer: Self-pay | Admitting: Family Medicine

## 2021-05-28 ENCOUNTER — Other Ambulatory Visit: Payer: Self-pay

## 2021-05-28 ENCOUNTER — Emergency Department (HOSPITAL_COMMUNITY): Payer: Medicare HMO

## 2021-05-28 DIAGNOSIS — Z940 Kidney transplant status: Secondary | ICD-10-CM | POA: Diagnosis not present

## 2021-05-28 DIAGNOSIS — N390 Urinary tract infection, site not specified: Secondary | ICD-10-CM | POA: Diagnosis present

## 2021-05-28 DIAGNOSIS — C50312 Malignant neoplasm of lower-inner quadrant of left female breast: Secondary | ICD-10-CM

## 2021-05-28 DIAGNOSIS — E1022 Type 1 diabetes mellitus with diabetic chronic kidney disease: Secondary | ICD-10-CM

## 2021-05-28 DIAGNOSIS — N179 Acute kidney failure, unspecified: Secondary | ICD-10-CM | POA: Diagnosis present

## 2021-05-28 DIAGNOSIS — D61810 Antineoplastic chemotherapy induced pancytopenia: Secondary | ICD-10-CM | POA: Diagnosis present

## 2021-05-28 DIAGNOSIS — Y830 Surgical operation with transplant of whole organ as the cause of abnormal reaction of the patient, or of later complication, without mention of misadventure at the time of the procedure: Secondary | ICD-10-CM | POA: Diagnosis present

## 2021-05-28 DIAGNOSIS — B9620 Unspecified Escherichia coli [E. coli] as the cause of diseases classified elsewhere: Secondary | ICD-10-CM | POA: Diagnosis present

## 2021-05-28 DIAGNOSIS — K219 Gastro-esophageal reflux disease without esophagitis: Secondary | ICD-10-CM | POA: Diagnosis present

## 2021-05-28 DIAGNOSIS — D696 Thrombocytopenia, unspecified: Secondary | ICD-10-CM | POA: Diagnosis present

## 2021-05-28 DIAGNOSIS — E1065 Type 1 diabetes mellitus with hyperglycemia: Secondary | ICD-10-CM | POA: Diagnosis present

## 2021-05-28 DIAGNOSIS — N1831 Chronic kidney disease, stage 3a: Secondary | ICD-10-CM | POA: Diagnosis present

## 2021-05-28 DIAGNOSIS — Z170 Estrogen receptor positive status [ER+]: Secondary | ICD-10-CM

## 2021-05-28 DIAGNOSIS — D849 Immunodeficiency, unspecified: Secondary | ICD-10-CM | POA: Diagnosis present

## 2021-05-28 DIAGNOSIS — R7881 Bacteremia: Secondary | ICD-10-CM | POA: Diagnosis present

## 2021-05-28 DIAGNOSIS — N12 Tubulo-interstitial nephritis, not specified as acute or chronic: Secondary | ICD-10-CM | POA: Diagnosis present

## 2021-05-28 DIAGNOSIS — I10 Essential (primary) hypertension: Secondary | ICD-10-CM

## 2021-05-28 DIAGNOSIS — T8619 Other complication of kidney transplant: Secondary | ICD-10-CM | POA: Diagnosis present

## 2021-05-28 DIAGNOSIS — N185 Chronic kidney disease, stage 5: Secondary | ICD-10-CM

## 2021-05-28 DIAGNOSIS — T8613 Kidney transplant infection: Secondary | ICD-10-CM | POA: Diagnosis present

## 2021-05-28 DIAGNOSIS — F32A Depression, unspecified: Secondary | ICD-10-CM | POA: Diagnosis present

## 2021-05-28 DIAGNOSIS — Z9221 Personal history of antineoplastic chemotherapy: Secondary | ICD-10-CM | POA: Diagnosis not present

## 2021-05-28 DIAGNOSIS — Z91048 Other nonmedicinal substance allergy status: Secondary | ICD-10-CM | POA: Diagnosis not present

## 2021-05-28 DIAGNOSIS — I129 Hypertensive chronic kidney disease with stage 1 through stage 4 chronic kidney disease, or unspecified chronic kidney disease: Secondary | ICD-10-CM | POA: Diagnosis present

## 2021-05-28 DIAGNOSIS — Z6841 Body Mass Index (BMI) 40.0 and over, adult: Secondary | ICD-10-CM | POA: Diagnosis not present

## 2021-05-28 DIAGNOSIS — Z888 Allergy status to other drugs, medicaments and biological substances status: Secondary | ICD-10-CM | POA: Diagnosis not present

## 2021-05-28 DIAGNOSIS — Z20822 Contact with and (suspected) exposure to covid-19: Secondary | ICD-10-CM | POA: Diagnosis present

## 2021-05-28 DIAGNOSIS — E871 Hypo-osmolality and hyponatremia: Secondary | ICD-10-CM | POA: Diagnosis present

## 2021-05-28 LAB — URINALYSIS, ROUTINE W REFLEX MICROSCOPIC
Bilirubin Urine: NEGATIVE
Glucose, UA: 150 mg/dL — AB
Hgb urine dipstick: NEGATIVE
Ketones, ur: NEGATIVE mg/dL
Nitrite: POSITIVE — AB
Protein, ur: 30 mg/dL — AB
Specific Gravity, Urine: 1.019 (ref 1.005–1.030)
WBC, UA: >50 WBC/hpf — ABNORMAL HIGH (ref 0–5)
pH: 5 (ref 5.0–8.0)

## 2021-05-28 LAB — BLOOD CULTURE ID PANEL (REFLEXED) - BCID2

## 2021-05-28 LAB — RESP PANEL BY RT-PCR (FLU A&B, COVID) ARPGX2
Influenza A by PCR: NEGATIVE
Influenza B by PCR: NEGATIVE
SARS Coronavirus 2 by RT PCR: NEGATIVE

## 2021-05-28 LAB — LACTIC ACID, PLASMA: Lactic Acid, Venous: 1.1 mmol/L (ref 0.5–1.9)

## 2021-05-28 LAB — GLUCOSE, CAPILLARY
Glucose-Capillary: 246 mg/dL — ABNORMAL HIGH (ref 70–99)
Glucose-Capillary: 303 mg/dL — ABNORMAL HIGH (ref 70–99)

## 2021-05-28 LAB — CBG MONITORING, ED: Glucose-Capillary: 257 mg/dL — ABNORMAL HIGH (ref 70–99)

## 2021-05-28 MED ORDER — INSULIN GLARGINE-YFGN 100 UNIT/ML ~~LOC~~ SOLN
20.0000 [IU] | Freq: Two times a day (BID) | SUBCUTANEOUS | Status: DC
  Administered 2021-05-28 – 2021-06-01 (×9): 20 [IU] via SUBCUTANEOUS
  Filled 2021-05-28 (×11): qty 0.2

## 2021-05-28 MED ORDER — MYCOPHENOLATE SODIUM 180 MG PO TBEC
360.0000 mg | DELAYED_RELEASE_TABLET | Freq: Two times a day (BID) | ORAL | Status: DC
  Administered 2021-05-28 – 2021-06-03 (×13): 360 mg via ORAL
  Filled 2021-05-28 (×14): qty 2

## 2021-05-28 MED ORDER — OXYCODONE HCL 5 MG PO TABS
5.0000 mg | ORAL_TABLET | Freq: Once | ORAL | Status: AC
  Administered 2021-05-28: 04:00:00 5 mg via ORAL
  Filled 2021-05-28: qty 1

## 2021-05-28 MED ORDER — ONDANSETRON HCL 4 MG/2ML IJ SOLN
4.0000 mg | Freq: Four times a day (QID) | INTRAMUSCULAR | Status: DC | PRN
  Administered 2021-05-30: 4 mg via INTRAVENOUS
  Filled 2021-05-28: qty 2

## 2021-05-28 MED ORDER — LACTATED RINGERS IV SOLN: INTRAVENOUS | Status: DC

## 2021-05-28 MED ORDER — PANTOPRAZOLE SODIUM 40 MG PO TBEC
40.0000 mg | DELAYED_RELEASE_TABLET | Freq: Every day | ORAL | Status: DC
  Administered 2021-05-28 – 2021-06-03 (×7): 40 mg via ORAL
  Filled 2021-05-28 (×5): qty 1

## 2021-05-28 MED ORDER — MEROPENEM 1 G IV SOLR
1.0000 g | Freq: Three times a day (TID) | INTRAVENOUS | Status: DC
  Administered 2021-05-28 – 2021-05-29 (×2): 1 g via INTRAVENOUS
  Filled 2021-05-28 (×3): qty 1

## 2021-05-28 MED ORDER — OXYCODONE HCL 5 MG PO TABS
5.0000 mg | ORAL_TABLET | Freq: Four times a day (QID) | ORAL | Status: DC | PRN
  Administered 2021-05-28 – 2021-06-01 (×9): 5 mg via ORAL
  Filled 2021-05-28 (×11): qty 1

## 2021-05-28 MED ORDER — TACROLIMUS 1 MG PO CAPS
3.0000 mg | ORAL_CAPSULE | Freq: Every day | ORAL | Status: DC
  Administered 2021-05-28 – 2021-06-03 (×7): 3 mg via ORAL
  Filled 2021-05-28 (×7): qty 3

## 2021-05-28 MED ORDER — SODIUM CHLORIDE 0.9 % IV SOLN
2.0000 g | INTRAVENOUS | Status: DC
  Administered 2021-05-28: 01:00:00 2 g via INTRAVENOUS
  Filled 2021-05-28 (×2): qty 20

## 2021-05-28 MED ORDER — GABAPENTIN 300 MG PO CAPS: 300.0000 mg | ORAL_CAPSULE | Freq: Every day | ORAL | Status: DC

## 2021-05-28 MED ORDER — INSULIN GLARGINE-YFGN 100 UNIT/ML ~~LOC~~ SOLN: 45.0000 [IU] | Freq: Every day | SUBCUTANEOUS | Status: DC

## 2021-05-28 MED ORDER — GABAPENTIN 300 MG PO CAPS
600.0000 mg | ORAL_CAPSULE | Freq: Every day | ORAL | Status: DC
  Administered 2021-05-28 – 2021-06-03 (×7): 600 mg via ORAL
  Filled 2021-05-28 (×7): qty 2

## 2021-05-28 MED ORDER — PREDNISONE 5 MG PO TABS
5.0000 mg | ORAL_TABLET | Freq: Every day | ORAL | Status: DC
  Administered 2021-05-28 – 2021-06-03 (×7): 5 mg via ORAL
  Filled 2021-05-28 (×7): qty 1

## 2021-05-28 MED ORDER — LACTATED RINGERS IV BOLUS (SEPSIS): 500.0000 mL | Freq: Once | INTRAVENOUS | Status: DC

## 2021-05-28 MED ORDER — INSULIN ASPART 100 UNIT/ML IJ SOLN
0.0000 [IU] | Freq: Every day | INTRAMUSCULAR | Status: DC
  Administered 2021-05-28: 22:00:00 4 [IU] via SUBCUTANEOUS
  Administered 2021-05-29: 2 [IU] via SUBCUTANEOUS
  Administered 2021-05-31: 3 [IU] via SUBCUTANEOUS
  Administered 2021-06-01: 4 [IU] via SUBCUTANEOUS
  Administered 2021-06-02: 3 [IU] via SUBCUTANEOUS
  Filled 2021-05-28: qty 0.05

## 2021-05-28 MED ORDER — ACETAMINOPHEN 325 MG PO TABS
650.0000 mg | ORAL_TABLET | Freq: Once | ORAL | Status: AC
  Administered 2021-05-28: 05:00:00 650 mg via ORAL
  Filled 2021-05-28: qty 2

## 2021-05-28 MED ORDER — LACTATED RINGERS IV SOLN: INTRAVENOUS | Status: AC

## 2021-05-28 MED ORDER — TACROLIMUS ER 1 MG PO TB24: 3.0000 mg | ORAL_TABLET | ORAL | Status: DC

## 2021-05-28 MED ORDER — HYDROMORPHONE HCL 1 MG/ML IJ SOLN
0.5000 mg | INTRAMUSCULAR | Status: AC | PRN
  Administered 2021-05-28 (×3): 0.5 mg via INTRAVENOUS
  Filled 2021-05-28: qty 1
  Filled 2021-05-28 (×2): qty 0.5

## 2021-05-28 MED ORDER — ACETAMINOPHEN 325 MG PO TABS
650.0000 mg | ORAL_TABLET | Freq: Four times a day (QID) | ORAL | Status: DC | PRN
  Administered 2021-05-28 – 2021-05-31 (×6): 650 mg via ORAL
  Filled 2021-05-28 (×6): qty 2

## 2021-05-28 MED ORDER — INSULIN ASPART 100 UNIT/ML IJ SOLN
0.0000 [IU] | Freq: Three times a day (TID) | INTRAMUSCULAR | Status: DC
  Administered 2021-05-28: 13:00:00 2 [IU] via SUBCUTANEOUS
  Administered 2021-05-28: 17:00:00 5 [IU] via SUBCUTANEOUS
  Administered 2021-05-28 – 2021-05-29 (×2): 8 [IU] via SUBCUTANEOUS
  Administered 2021-05-29: 5 [IU] via SUBCUTANEOUS
  Administered 2021-05-29: 8 [IU] via SUBCUTANEOUS
  Administered 2021-05-30 (×2): 3 [IU] via SUBCUTANEOUS
  Administered 2021-05-30: 8 [IU] via SUBCUTANEOUS
  Administered 2021-05-31: 5 [IU] via SUBCUTANEOUS
  Administered 2021-05-31: 8 [IU] via SUBCUTANEOUS
  Administered 2021-05-31: 3 [IU] via SUBCUTANEOUS
  Administered 2021-06-01: 8 [IU] via SUBCUTANEOUS
  Administered 2021-06-01 (×2): 5 [IU] via SUBCUTANEOUS
  Administered 2021-06-02: 8 [IU] via SUBCUTANEOUS
  Administered 2021-06-02: 5 [IU] via SUBCUTANEOUS
  Administered 2021-06-02 – 2021-06-03 (×3): 3 [IU] via SUBCUTANEOUS
  Filled 2021-05-28: qty 0.15

## 2021-05-28 MED ORDER — SENNOSIDES-DOCUSATE SODIUM 8.6-50 MG PO TABS
1.0000 | ORAL_TABLET | Freq: Every evening | ORAL | Status: DC | PRN
  Administered 2021-05-28: 21:00:00 1 via ORAL
  Filled 2021-05-28: qty 1

## 2021-05-28 NOTE — Progress Notes (Signed)
Same day note  Patient seen and examined at bedside.  Patient was admitted to the hospital for fever, chills and dysuria.  At the time of my evaluation, patient complains of generalized body pain.  Physical examination reveals obese female, decreased breath sounds bilaterally.  Abdomen soft.  Laboratory data and imaging was reviewed  Assessment and Plan.  UTI  likely cystitis.   Continue IV Rocephin.  Follow urine culture blood culture.  Hx of renal transplant; CKD IIia  - Deceased donor kidney recipient in 2017.  Serum creatinine was 1.2 on admission which is close to baseline.  On tacrolimus mycophenolate and prednisone.  Breast cancer status postlumpectomy and chemotherapy  received docetaxel and cyclophosphamide on 05/24/21 and Udenyca on 05/26/21   Patient follows up with oncology as outpatient.   Insulin-dependent DM  Patient has hemoglobin A1c of 11.6 in May 2022.  Continue sliding scale insulin Accu-Cheks diabetic diet.   Thrombocytopenia  92K on presentation.  We will continue to monitor  Mild hyponatremia. We will continue to monitor.  Spoke with the patient's daughter at bedside.  No Charge  Signed,  Delila Pereyra, MD [facility]

## 2021-05-28 NOTE — Progress Notes (Signed)
Pharmacy Antibiotic Note  Stacy Sailer is a 50 y.o. female admitted on 05/27/2021 with  ESBL Ecoli bacteremia .  Pharmacy has been consulted for meropenem.  Plan: Meropenem 1g IV q8 per current renal function  Height: 5\' 1"  (154.9 cm) Weight: 93 kg (205 lb) IBW/kg (Calculated) : 47.8  Temp (24hrs), Avg:98.4 F (36.9 C), Min:98.2 F (36.8 C), Max:98.8 F (37.1 C)  Recent Labs  Lab 05/24/21 1020 05/27/21 2259 05/28/21 0040  WBC 8.4 9.0  --   CREATININE 1.16* 1.21*  --   LATICACIDVEN  --   --  1.1    Estimated Creatinine Clearance: 56.6 mL/min (A) (by C-G formula based on SCr of 1.21 mg/dL (H)).    Allergies  Allergen Reactions   Other Rash    Burns skin.  Please use paper tape only Burns skin.  Please use paper tape only   Docetaxel Nausea Only    Diaphoretic.  Patient had hypersensitivity reaction to docetaxel.  See progress note from 05/24/2021.  Patient able to complete infusion.   Norvasc [Amlodipine] Swelling   Tape Other (See Comments)    Burns skin.  Please use paper tape only      Thank you for allowing pharmacy to be a part of this patient's care.  [name] 05/28/2021 6:08 PM

## 2021-05-28 NOTE — H&P (Signed)
History and Physical    Cynthia [name] CVE:[id number] DOB: January 20, 1971 DOA: 05/27/2021  PCP: Arthur Holms, NP   Patient coming from: Home   Chief Complaint: Fever, shaking chills, dysuria   HPI: Cynthia [name] is a 50 y.o. female with medical history significant for renal transplant recipient, CKD 3A, insulin-dependent diabetes mellitus, and breast cancer currently on chemotherapy, presenting to the emergency department with 2 to 3 days of dysuria and urinary urgency and frequency, and then 1 day of fever at home with shaking chills.  Patient was treated with docetaxel and cyclophosphamide on 05/24/21, developed dysuria and urinary urgency and frequency the following day, received Udenyca on 05/26/21, and then had fever to 100.6 F and rigors at home the day after that.  She denies any cough or shortness of breath, denies flank pain, but reports generalized aches.  ED Course: Upon arrival to the ED, patient is found to be afebrile, saturating mid 90s on room air, slightly tachycardic, and with stable blood pressure.  EKG features sinus rhythm and chest x-ray negative for acute cardiopulmonary disease.  Chemistry panel notable for glucose 3 and 26 and creatinine 1.21.  CBC with mild normocytic anemia and platelets 92,000.  Lactic acid was normal and COVID and influenza PCR negative blood and urine cultures were collected in the ED and the patient was given 500 mL of LR and started on IV Rocephin.  Review of Systems:  All other systems reviewed and apart from HPI, are negative.  Past Medical History:  Diagnosis Date   Allergy    pollen   Anemia    when on dialysis   Anxiety    BRCA2 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 03/01/2021   Breast cancer [facility])    Cataract    surgical repair bilateral   Depression    ESRD on hemodialysis ([place])    Home HD 5x per week- not on dialysis now had tramsplant 4/17   Family history of ovarian cancer 02/22/2021   GERD (gastroesophageal reflux disease)    Hearing  loss 2017   right ear   History of blood transfusion    transfusion reaction   Hyperlipidemia    Hypertension    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 per pt-dr levy also II   Kidney transplant recipient    Neuromuscular disorder ([place])    NEUROPATHY   Sleep apnea     Past Surgical History:  Procedure Laterality Date   ABDOMINAL HYSTERECTOMY     BREAST EXCISIONAL BIOPSY Right 08/2015   BREAST LUMPECTOMY WITH RADIOACTIVE SEED LOCALIZATION Right 09/14/2015   Procedure: RIGHT BREAST LUMPECTOMY WITH RADIOACTIVE SEED LOCALIZATION;  Surgeon: Donnie Mesa, MD;  Location: [place];  Service: General;  Laterality: Right;   BREAST LUMPECTOMY WITH RADIOACTIVE SEED LOCALIZATION Left 03/02/2021   Procedure: LEFT BREAST SEED LUMPECTOMY LEFT SENTINEL LYMPH NODE [place];  Surgeon: Erroll Luna, MD;  Location: [place];  Service: General;  Laterality: Left;  GEN AND PEC BLOCK   BREAST SURGERY Bilateral    biopsy bilateral   CATARACT EXTRACTION Bilateral    bilateral   CHOLECYSTECTOMY     CYST REMOVAL NECK     DIALYSIS FISTULA CREATION Left    EYE SURGERY Bilateral    lazer   LEFT HEART CATH AND CORONARY ANGIOGRAPHY N/A 03/04/2019   Procedure: LEFT HEART CATH AND CORONARY ANGIOGRAPHY;  Surgeon: Martinique, Peter M, MD;  Location: [place] CV LAB;  Service:  Cardiovascular;  Laterality: N/A;   LEFT HEART CATHETERIZATION WITH CORONARY ANGIOGRAM N/A 03/30/2014   Procedure: LEFT HEART CATHETERIZATION WITH CORONARY ANGIOGRAM;  Surgeon: Sinclair Grooms, MD;  Location: [facility] CATH LAB;  Service: Cardiovascular;  Laterality: N/A;   LIPOMA EXCISION N/A 02/06/2017   Procedure: EXCISION POSTERIOR NECK SEBACEOUS CYST;  Surgeon: Coralie Keens, MD;  Location: [place];  Service: General;  Laterality: N/A;   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   Procedure: REPAIR OF ARTERIOVENOUS FISTULA ANEURYSM;  Surgeon:  Serafina Mitchell, MD;  Location: [facility];  Service: Vascular;  Laterality: Left;   REVISON OF ARTERIOVENOUS FISTULA Left 09/28/2013   Procedure: EXCISE ESCHAR LEFT ARM  ARTERIOVENOUS FISTULA WITH PLICATION OF LEFT ARM ARTERIOVENOUS FISTULA;  Surgeon: Elam Dutch, MD;  Location: [place];  Service: Vascular;  Laterality: Left;   REVISON OF ARTERIOVENOUS FISTULA Left 08/26/2015   Procedure: RESECTION ANEURYSM OF LEFT ARM ARTERIOVENOUS FISTULA  ;  Surgeon: Serafina Mitchell, MD;  Location: [place];  Service: Vascular;  Laterality: Left;   TUBAL LIGATION      Social History:   reports that she has never smoked. She has never used smokeless tobacco. She reports current alcohol use. She reports that she does not use drugs.  Allergies  Allergen Reactions   Other Rash    Burns skin.  Please use paper tape only Burns skin.  Please use paper tape only   Docetaxel Nausea Only    Diaphoretic.  Patient had hypersensitivity reaction to docetaxel.  See progress note from 05/24/2021.  Patient able to complete infusion.   Norvasc [Amlodipine] Swelling   Tape Other (See Comments)    Burns skin.  Please use paper tape only    Family History  Problem Relation Age of Onset   Diabetes Mother    Kidney disease Mother    Diabetes Father    Heart disease Father    Kidney disease Father    Hypertension Father    Diabetes Sister    Asthma Sister    Lupus Sister    Diabetes Brother    Diabetes Brother    Diabetes Brother    Diabetes Brother    Cancer Maternal Grandmother 60       ovarian cancer   Diabetes Maternal Grandfather    Diabetes Paternal Grandmother    Colon cancer Neg Hx    Colon polyps Neg Hx    Esophageal cancer Neg Hx    Rectal cancer Neg Hx    Stomach cancer Neg Hx      Prior to Admission medications   Medication Sig Start Date End Date Taking? Authorizing Provider  Accu-Chek Softclix Lancets lancets Use as instructed 08/03/20   Renato Shin, MD  aspirin EC 81 MG EC tablet Take 1 tablet  (81 mg total) by mouth daily. Patient taking differently: Take 81 mg by mouth at bedtime. 03/31/14   Katheren Shams, DO  B Complex-C (SUPER B COMPLEX PO) Take 1 tablet by mouth daily.     [provider]  Blood Glucose Monitoring Suppl (ACCU-CHEK AVIVA CONNECT) w/Device KIT Check sugar 1x daily 08/03/20   Renato Shin, MD  carvedilol (COREG) 25 MG tablet Take 12.5 mg by mouth 2 (two) times daily with a meal.  11/23/15   [provider]  cetirizine (ZYRTEC) 10 MG tablet Take 5 mg by mouth daily. Takes 0.5 tablet daily    [provider]  Cholecalciferol (VITAMIN D3) 1000 units CAPS Take 1,000  Units by mouth daily.  02/16/16   [provider]  dexamethasone (DECADRON) 4 MG tablet Take 2 tablets (8 mg total) by mouth 2 (two) times daily. Start the day before Taxotere. Then again the day after chemo for 3 days. 05/02/21   Truitt Merle, MD  fludrocortisone (FLORINEF) 0.1 MG tablet Take 0.1 mg by mouth daily.    [provider]  FLUoxetine (PROZAC) 40 MG capsule Take 40 mg by mouth daily.    [provider]  furosemide (LASIX) 40 MG tablet Take 40 mg by mouth 2 (two) times daily. 12/07/20   [provider]  gabapentin (NEURONTIN) 300 MG capsule Take 300 mg by mouth at bedtime. 11/16/14   [provider]  glucose blood (ACCU-CHEK AVIVA PLUS) test strip Check sugar 1x daily 08/03/20   Renato Shin, MD  ibuprofen (ADVIL) 800 MG tablet Take 1 tablet (800 mg total) by mouth every 8 (eight) hours as needed. 03/02/21   Cornett, Marcello Moores, MD  insulin glargine (LANTUS SOLOSTAR) 100 UNIT/ML Solostar Pen Inject 65 Units into the skin every morning. 11/03/20   Renato Shin, MD  Insulin Pen Needle (PEN NEEDLES) 30G X 8 MM MISC 1 each by Does not apply route daily. E11.9 09/01/19   Renato Shin, MD  loperamide (IMODIUM) 2 MG capsule Take 1 capsule (2 mg total) by mouth 4 (four) times daily as needed for diarrhea or loose stools. 39/76/73   Delora Fuel, MD   magnesium oxide (MAG-OX) 400 MG tablet Take 400 mg by mouth 2 (two) times daily.  12/09/15   [provider]  mycophenolate (MYFORTIC) 180 MG EC tablet Take 360 mg by mouth 2 (two) times daily.    [provider]  ondansetron (ZOFRAN) 8 MG tablet Take 1 tablet (8 mg total) by mouth 2 (two) times daily as needed for refractory nausea / vomiting. Start on day 3 after chemo. 05/02/21   Truitt Merle, MD  oxyCODONE (OXY IR/ROXICODONE) 5 MG immediate release tablet Take 1 tablet (5 mg total) by mouth every 6 (six) hours as needed for severe pain. 05/08/21   Truitt Merle, MD  pantoprazole (PROTONIX) 40 MG tablet Take 40 mg by mouth daily. 08/02/15   [provider]  predniSONE (DELTASONE) 5 MG tablet Take 5 mg by mouth daily.  01/04/17   [provider]  prochlorperazine (COMPAZINE) 10 MG tablet Take 1 tablet (10 mg total) by mouth every 6 (six) hours as needed (Nausea or vomiting). 05/02/21   Truitt Merle, MD  rosuvastatin (CRESTOR) 5 MG tablet Take 5 mg by mouth at bedtime.    [provider]  Tacrolimus ER (ENVARSUS XR) 1 MG TB24 Take 3 mg by mouth every morning.    [provider]  vitamin E 400 UNIT capsule Take 400 Units by mouth daily.    [provider]    Physical Exam: Vitals:   05/28/21 0200 05/28/21 0230 05/28/21 0416 05/28/21 0430  BP: (!) 174/68 (!) 175/69 (!) 174/65   Pulse: 86 82 85   Resp: '14 12 18   ' Temp: 98.5 F (36.9 C)   98.8 F (37.1 C)  TempSrc: Rectal     SpO2: 93% 93% 98%   Weight:      Height:        Constitutional: NAD, calm  Eyes: PERTLA, lids and conjunctivae normal ENMT: Mucous membranes are moist. Posterior pharynx clear of any exudate or lesions.   Neck: supple, no masses  Respiratory:  no wheezing, no crackles.  No accessory muscle use.  Cardiovascular: S1 & S2 heard, regular rate and rhythm. No extremity edema.   Abdomen: No distension, soft. Bowel sounds active.  Musculoskeletal: no clubbing / cyanosis. No  joint deformity upper and lower extremities.   Skin: no significant rashes, lesions, ulcers. Warm, dry, well-perfused. Neurologic: CN 2-12 grossly intact. Moving all extremities. Alert and oriented.  Psychiatric: Calm. Cooperative.    Labs and Imaging on Admission: I have personally reviewed following labs and imaging studies  CBC: Recent Labs  Lab 05/24/21 1020 05/27/21 2259  WBC 8.4 9.0  NEUTROABS 6.2 7.0  HGB 11.4* 11.8*  HCT 34.7* 35.4*  MCV 89.7 90.3  PLT 185 92*   Basic Metabolic Panel: Recent Labs  Lab 05/24/21 1020 05/27/21 2259  NA 132* 128*  K 4.8 4.9  CL 101 99  CO2 24 22  GLUCOSE 285* 326*  BUN 20 36*  CREATININE 1.16* 1.21*  CALCIUM 9.3 9.2   GFR: Estimated Creatinine Clearance: 56.6 mL/min (A) (by C-G formula based on SCr of 1.21 mg/dL (H)). Liver Function Tests: Recent Labs  Lab 05/24/21 1020 05/27/21 2259  AST 21 14*  ALT 37 26  ALKPHOS 99 114  BILITOT 0.5 0.7  PROT 5.7* 6.0*  ALBUMIN 3.4* 3.5   No results for input(s): LIPASE, AMYLASE in the last 168 hours. No results for input(s): AMMONIA in the last 168 hours. Coagulation Profile: No results for input(s): INR, PROTIME in the last 168 hours. Cardiac Enzymes: No results for input(s): CKTOTAL, CKMB, CKMBINDEX, TROPONINI in the last 168 hours. BNP (last 3 results) No results for input(s): PROBNP in the last 8760 hours. HbA1C: No results for input(s): HGBA1C in the last 72 hours. CBG: No results for input(s): GLUCAP in the last 168 hours. Lipid Profile: No results for input(s): CHOL, HDL, LDLCALC, TRIG, CHOLHDL, LDLDIRECT in the last 72 hours. Thyroid Function Tests: No results for input(s): TSH, T4TOTAL, FREET4, T3FREE, THYROIDAB in the last 72 hours. Anemia Panel: No results for input(s): VITAMINB12, FOLATE, FERRITIN, TIBC, IRON, RETICCTPCT in the last 72 hours. Urine analysis:    Component Value Date/Time   COLORURINE YELLOW 05/27/2021 0008   APPEARANCEUR HAZY (A) 05/27/2021 0008    LABSPEC 1.019 05/27/2021 0008   PHURINE 5.0 05/27/2021 0008   GLUCOSEU 150 (A) 05/27/2021 0008   HGBUR NEGATIVE 05/27/2021 0008   BILIRUBINUR NEGATIVE 05/27/2021 0008   KETONESUR NEGATIVE 05/27/2021 0008   PROTEINUR 30 (A) 05/27/2021 0008   UROBILINOGEN 0.2 05/15/2013 1100   NITRITE POSITIVE (A) 05/27/2021 0008   LEUKOCYTESUR MODERATE (A) 05/27/2021 0008   Sepsis Labs: '@LABRCNTIP' (procalcitonin:4,lacticidven:4) ) Recent Results (from the past 240 hour(s))  Resp Panel by RT-PCR (Flu A&B, Covid) Nasopharyngeal Swab     Status: None   Collection Time: 05/27/21 11:52 PM   Specimen: Nasopharyngeal Swab; Nasopharyngeal(NP) swabs in vial transport medium  Result Value Ref Range Status   SARS Coronavirus 2 by RT PCR NEGATIVE NEGATIVE Final    Comment: (NOTE) SARS-CoV-2 target nucleic acids are NOT DETECTED.  The SARS-CoV-2 RNA is generally detectable in upper respiratory specimens during the acute phase of infection. The lowest concentration of SARS-CoV-2 viral copies this assay can detect is 138 copies/mL. A negative result does not preclude SARS-Cov-2 infection and should not be used as the sole basis for treatment or other patient management decisions. A negative result may occur with  improper specimen collection/handling, submission of specimen other than nasopharyngeal swab, presence of viral mutation(s) within the areas targeted by this assay, and  inadequate number of viral copies(<138 copies/mL). A negative result must be combined with clinical observations, patient history, and epidemiological information. The expected result is Negative.  Fact Sheet for Patients:  EntrepreneurPulse.com.au  Fact Sheet for Healthcare Providers:  IncredibleEmployment.be  This test is no t yet approved or cleared by the [place] FDA and  has been authorized for detection and/or diagnosis of SARS-CoV-2 by FDA under an Emergency Use Authorization  (EUA). This EUA will remain  in effect (meaning this test can be used) for the duration of the COVID-19 declaration under Section 564(b)(1) of the Act, 21 U.S.C.section 360bbb-3(b)(1), unless the authorization is terminated  or revoked sooner.       Influenza A by PCR NEGATIVE NEGATIVE Final   Influenza B by PCR NEGATIVE NEGATIVE Final    Comment: (NOTE) The Xpert Xpress SARS-CoV-2/FLU/RSV plus assay is intended as an aid in the diagnosis of influenza from Nasopharyngeal swab specimens and should not be used as a sole basis for treatment. Nasal washings and aspirates are unacceptable for Xpert Xpress SARS-CoV-2/FLU/RSV testing.  Fact Sheet for Patients: EntrepreneurPulse.com.au  Fact Sheet for Healthcare Providers: IncredibleEmployment.be  This test is not yet approved or cleared by the [place] FDA and has been authorized for detection and/or diagnosis of SARS-CoV-2 by FDA under an Emergency Use Authorization (EUA). This EUA will remain in effect (meaning this test can be used) for the duration of the COVID-19 declaration under Section 564(b)(1) of the Act, 21 U.S.C. section 360bbb-3(b)(1), unless the authorization is terminated or revoked.  Performed at [facility], [place] [address] [place], [place] [address]      Radiological Exams on Admission: DG Chest 2 View  Result Date: 05/28/2021 CLINICAL DATA:  Low-grade fever EXAM: CHEST - 2 VIEW COMPARISON:  06/01/2019 FINDINGS: The heart size and mediastinal contours are within normal limits. Both lungs are clear. The visualized skeletal structures are unremarkable. IMPRESSION: No active cardiopulmonary disease. Electronically Signed   By: Inez Catalina M.D.   On: 05/28/2021 03:04    EKG: Independently reviewed. Sinus rhythm, LAFB.   Assessment/Plan   1. UTI  - Pt with renal transplant and breast cancer on chemo p/w 2-3 days dysuria and 1 day of rigors and fever at  home  - Not septic on admission  - Blood and urine cultures collected in ED and IV Rocephin started  - Continue Rocephin, follow cultures and clinical course    2. Hx of renal transplant; CKD IIia  - Deceased donor kidney recipient in 2017  - SCr is 1.21 on admission which appears close to baseline  - Continue tacrolimus, mycophenolate, prednisone    3. Breast cancer  - Diagnosed Aug 2022, s/p lumpectomy, received docetaxel and cyclophosphamide on 05/24/21 and Udenyca on 05/26/21    4. Insulin-dependent DM  - A1c was 11.6% in May 2022  - Continue CBG checks and insulin    5. Thrombocytopenia  - Platelets 92,000 on admission  - Likely from chemotherapy, will monitor     DVT prophylaxis: SCDs  Code Status: Full  Level of Care: Level of care: Med-Surg Family Communication: Daughter updated at bedside  Disposition Plan:  Patient is from: home  Anticipated d/c is to: Home  Anticipated d/c date is: 12/6 or 05/31/21  Patient currently: pending cultures  Consults called: none Admission status: Inpatient     Vianne Bulls, MD [facility]  05/28/2021, 6:44 AM

## 2021-05-28 NOTE — Progress Notes (Signed)
Patient found to be febrile of 102.4. tylenol given. Blankets, sweatpants, and hat removed. Temperature in the room turned down. Educated patient and daughter on the importance of keeping blankets off in order to help bring temperature back down. Will continue to monitor patient this shift.

## 2021-05-28 NOTE — ED Provider Notes (Signed)
[facility] DEPT Provider Note   CSN: [id number] Arrival date & time: 05/27/21  2225     History Chief Complaint  Patient presents with   Generalized Body Aches   Fever    Cynthia [name] is a 50 y.o. female with past medical history of DM, status post kidney transplant, currently undergoing chemo for breast cancer with last chemo infusion on Wednesday who presents today for evaluation of multiple complaints.  Her primary concern is that she had temperatures of 100.4 and then 100.6 today.  She has had dysuria for about 2 to 3 days with associated urinary frequency and urgency.  She has not been on any antibiotics for this so far.  Chart review shows that she had UA obtained through cancer clinic.  She did have about 1 hour of shaking chills today.    She did take tylenol at 4pm today.   History obtained from patient, patient's family at bedside, and chart review. Professional Spanish-speaking medical interpreter was used for all interactions with patient.   HPI     Past Medical History:  Diagnosis Date   Allergy    pollen   Anemia    when on dialysis   Anxiety    BRCA2 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 03/01/2021   Breast cancer [facility])    Cataract    surgical repair bilateral   Depression    ESRD on hemodialysis ([place])    Home HD 5x per week- not on dialysis now had tramsplant 4/17   Family history of ovarian cancer 02/22/2021   GERD (gastroesophageal reflux disease)    Hearing loss 2017   right ear   History of blood transfusion    transfusion reaction   Hyperlipidemia    Hypertension    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 per pt-dr levy also II   Kidney transplant recipient    Neuromuscular disorder ([place])    NEUROPATHY   Sleep apnea     Patient Active Problem List   Diagnosis Date Noted   Acute UTI 05/28/2021   Stage 3a chronic kidney disease (CKD) ([place]) 05/28/2021    Thrombocytopenia ([place]) 05/28/2021   Aortic atherosclerosis ([place]) 03/07/2021   Hypercholesterolemia 03/07/2021   BRCA2 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 03/01/2021   Genetic testing 03/01/2021   Family history of ovarian cancer 02/22/2021   Malignant neoplasm of lower-inner quadrant of left breast in female, estrogen receptor positive ([place]) 02/15/2021   Foot pain, bilateral 07/28/2020   COVID-19 virus infection 06/16/2019   Insulin dependent type 2 diabetes mellitus ([place]) 06/06/2019   Hyperglycemia 06/02/2019   DKA (diabetic ketoacidoses) 06/02/2019   HTN (hypertension) 03/04/2019   Abnormal nuclear stress test 03/04/2019   Diabetic nephropathy ([place]) 03/03/2019   History of renal transplant 11/18/2015   Diabetic retinopathy associated with type 2 diabetes mellitus ([place]) 04/13/2015   Postprandial vomiting    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   Anemia in CKD (chronic kidney disease) 01/04/2015   Pseudoaneurysm of arteriovenous dialysis fistula ([place]) 03/16/2014   Hyperkalemia 11/16/2013   Chronic constipation 07/28/2013   Paresthesias 07/28/2013   Diabetes mellitus type I ([place])    Kidney transplant status 05/28/2013   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/11/2013   Precordial chest pain 02/10/2013   Awaiting organ transplant 09/19/2012    Past Surgical History:  Procedure Laterality Date   ABDOMINAL HYSTERECTOMY     BREAST EXCISIONAL BIOPSY Right 08/2015   BREAST LUMPECTOMY WITH RADIOACTIVE SEED LOCALIZATION Right 09/14/2015  Procedure: RIGHT BREAST LUMPECTOMY WITH RADIOACTIVE SEED LOCALIZATION;  Surgeon: Donnie Mesa, MD;  Location: [place];  Service: General;  Laterality: Right;   BREAST LUMPECTOMY WITH RADIOACTIVE SEED LOCALIZATION Left 03/02/2021   Procedure: LEFT BREAST SEED LUMPECTOMY LEFT SENTINEL LYMPH NODE [place];  Surgeon: Erroll Luna, MD;  Location: [place];  Service: General;  Laterality: Left;  GEN AND PEC BLOCK   BREAST SURGERY Bilateral    biopsy  bilateral   CATARACT EXTRACTION Bilateral    bilateral   CHOLECYSTECTOMY     CYST REMOVAL NECK     DIALYSIS FISTULA CREATION Left    EYE SURGERY Bilateral    lazer   LEFT HEART CATH AND CORONARY ANGIOGRAPHY N/A 03/04/2019   Procedure: LEFT HEART CATH AND CORONARY ANGIOGRAPHY;  Surgeon: Martinique, Peter M, MD;  Location: [place] CV LAB;  Service: Cardiovascular;  Laterality: N/A;   LEFT HEART CATHETERIZATION WITH CORONARY ANGIOGRAM N/A 03/30/2014   Procedure: LEFT HEART CATHETERIZATION WITH CORONARY ANGIOGRAM;  Surgeon: Sinclair Grooms, MD;  Location: [facility] CATH LAB;  Service: Cardiovascular;  Laterality: N/A;   LIPOMA EXCISION N/A 02/06/2017   Procedure: EXCISION POSTERIOR NECK SEBACEOUS CYST;  Surgeon: Coralie Keens, MD;  Location: [place];  Service: General;  Laterality: N/A;   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   Procedure: REPAIR OF ARTERIOVENOUS FISTULA ANEURYSM;  Surgeon: Serafina Mitchell, MD;  Location[facility];  Service: Vascular;  Laterality: Left;   REVISON OF ARTERIOVENOUS FISTULA Left 09/28/2013   Procedure: EXCISE ESCHAR LEFT ARM  ARTERIOVENOUS FISTULA WITH PLICATION OF LEFT ARM ARTERIOVENOUS FISTULA;  Surgeon: Elam Dutch, MD;  Location: [place];  Service: Vascular;  Laterality: Left;   REVISON OF ARTERIOVENOUS FISTULA Left 08/26/2015   Procedure: RESECTION ANEURYSM OF LEFT ARM ARTERIOVENOUS FISTULA  ;  Surgeon: Serafina Mitchell, MD;  Location: [facility] OR;  Service: Vascular;  Laterality: Left;   TUBAL LIGATION       OB History   No obstetric history on file.     Family History  Problem Relation Age of Onset   Diabetes Mother    Kidney disease Mother    Diabetes Father    Heart disease Father    Kidney disease Father    Hypertension Father    Diabetes Sister    Asthma Sister    Lupus Sister    Diabetes Brother    Diabetes Brother    Diabetes Brother    Diabetes Brother    Cancer Maternal Grandmother 40       ovarian cancer   Diabetes Maternal  Grandfather    Diabetes Paternal Grandmother    Colon cancer Neg Hx    Colon polyps Neg Hx    Esophageal cancer Neg Hx    Rectal cancer Neg Hx    Stomach cancer Neg Hx     Social History   Tobacco Use   Smoking status: Never   Smokeless tobacco: Never  Vaping Use   Vaping Use: Never used  Substance Use Topics   Alcohol use: Yes    Comment: Socially   Drug use: No    Home Medications Prior to Admission medications   Medication Sig Start Date End Date Taking? Authorizing Provider  Accu-Chek Softclix Lancets lancets Use as instructed 08/03/20   Renato Shin, MD  aspirin EC 81 MG EC tablet Take 1 tablet (81 mg total) by mouth daily. Patient taking differently: Take 81 mg by mouth at bedtime. 03/31/14  Luiz Blare Y, DO  B Complex-C (SUPER B COMPLEX PO) Take 1 tablet by mouth daily.     [provider]  Blood Glucose Monitoring Suppl (ACCU-CHEK AVIVA CONNECT) w/Device KIT Check sugar 1x daily 08/03/20   Renato Shin, MD  carvedilol (COREG) 25 MG tablet Take 12.5 mg by mouth 2 (two) times daily with a meal.  11/23/15   [provider]  cetirizine (ZYRTEC) 10 MG tablet Take 5 mg by mouth daily. Takes 0.5 tablet daily    [provider]  Cholecalciferol (VITAMIN D3) 1000 units CAPS Take 1,000 Units by mouth daily.  02/16/16   [provider]  dexamethasone (DECADRON) 4 MG tablet Take 2 tablets (8 mg total) by mouth 2 (two) times daily. Start the day before Taxotere. Then again the day after chemo for 3 days. 05/02/21   Truitt Merle, MD  fludrocortisone (FLORINEF) 0.1 MG tablet Take 0.1 mg by mouth daily.    [provider]  FLUoxetine (PROZAC) 40 MG capsule Take 40 mg by mouth daily.    [provider]  furosemide (LASIX) 40 MG tablet Take 40 mg by mouth 2 (two) times daily. 12/07/20   [provider]  gabapentin (NEURONTIN) 300 MG capsule Take 300 mg by mouth at bedtime. 11/16/14   [provider]  glucose blood (ACCU-CHEK  AVIVA PLUS) test strip Check sugar 1x daily 08/03/20   Renato Shin, MD  ibuprofen (ADVIL) 800 MG tablet Take 1 tablet (800 mg total) by mouth every 8 (eight) hours as needed. 03/02/21   Cornett, Marcello Moores, MD  insulin glargine (LANTUS SOLOSTAR) 100 UNIT/ML Solostar Pen Inject 65 Units into the skin every morning. 11/03/20   Renato Shin, MD  Insulin Pen Needle (PEN NEEDLES) 30G X 8 MM MISC 1 each by Does not apply route daily. E11.9 09/01/19   Renato Shin, MD  loperamide (IMODIUM) 2 MG capsule Take 1 capsule (2 mg total) by mouth 4 (four) times daily as needed for diarrhea or loose stools. 85/63/14   Delora Fuel, MD  magnesium oxide (MAG-OX) 400 MG tablet Take 400 mg by mouth 2 (two) times daily.  12/09/15   [provider]  mycophenolate (MYFORTIC) 180 MG EC tablet Take 360 mg by mouth 2 (two) times daily.    [provider]  ondansetron (ZOFRAN) 8 MG tablet Take 1 tablet (8 mg total) by mouth 2 (two) times daily as needed for refractory nausea / vomiting. Start on day 3 after chemo. 05/02/21   Truitt Merle, MD  oxyCODONE (OXY IR/ROXICODONE) 5 MG immediate release tablet Take 1 tablet (5 mg total) by mouth every 6 (six) hours as needed for severe pain. 05/08/21   Truitt Merle, MD  pantoprazole (PROTONIX) 40 MG tablet Take 40 mg by mouth daily. 08/02/15   [provider]  predniSONE (DELTASONE) 5 MG tablet Take 5 mg by mouth daily.  01/04/17   [provider]  prochlorperazine (COMPAZINE) 10 MG tablet Take 1 tablet (10 mg total) by mouth every 6 (six) hours as needed (Nausea or vomiting). 05/02/21   Truitt Merle, MD  rosuvastatin (CRESTOR) 5 MG tablet Take 5 mg by mouth at bedtime.    [provider]  Tacrolimus ER (ENVARSUS XR) 1 MG TB24 Take 3 mg by mouth every morning.    [provider]  vitamin E 400 UNIT capsule Take 400 Units by mouth daily.    [provider]    Allergies    Other, Docetaxel, Norvasc [amlodipine], and  Tape  Review of Systems    Review of Systems  Constitutional:  Positive for chills, fatigue and fever.  HENT:  Negative for congestion.   Respiratory:  Negative for cough, chest tightness and shortness of breath.   Cardiovascular:  Negative for chest pain.  Gastrointestinal:  Negative for abdominal pain, nausea and vomiting.  Genitourinary:  Positive for dysuria, frequency and urgency. Negative for flank pain.  Musculoskeletal:  Negative for arthralgias and myalgias.  Skin:  Negative for color change and rash.  Neurological:  Negative for headaches.  Psychiatric/Behavioral:  Negative for confusion.   All other systems reviewed and are negative.  Physical Exam Updated Vital Signs BP (!) 120/53   Pulse (!) 113   Temp 98.8 F (37.1 C)   Resp 19   Ht '5\' 1"'  (1.549 m)   Wt 93 kg   SpO2 (!) 87%   BMI 38.73 kg/m   Physical Exam Vitals and nursing note reviewed.  Constitutional:      General: She is not in acute distress.    Appearance: She is well-developed. She is not ill-appearing.  HENT:     Head: Normocephalic and atraumatic.     Mouth/Throat:     Mouth: Mucous membranes are moist.  Eyes:     Conjunctiva/sclera: Conjunctivae normal.  Cardiovascular:     Rate and Rhythm: Normal rate and regular rhythm.     Pulses: Normal pulses.     Heart sounds: Murmur heard.  Pulmonary:     Effort: Pulmonary effort is normal. No respiratory distress.     Breath sounds: Normal breath sounds.  Abdominal:     General: There is no distension.     Palpations: Abdomen is soft.     Tenderness: There is no abdominal tenderness. There is no guarding.  Musculoskeletal:        General: No swelling.     Cervical back: Normal range of motion and neck supple.     Right lower leg: No edema.     Left lower leg: No edema.     Comments: No obvious acute injury  Skin:    General: Skin is warm and dry.     Capillary Refill: Capillary refill takes less than 2 seconds.  Neurological:     Mental Status: She is alert.      Comments: Awake and alert, answers all questions appropriately.  Speech is not slurred.  Psychiatric:        Mood and Affect: Mood normal.        Behavior: Behavior normal.    ED Results / Procedures / Treatments   Labs (all labs ordered are listed, but only abnormal results are displayed) Labs Reviewed  COMPREHENSIVE METABOLIC PANEL - Abnormal; Notable for the following components:      Result Value   Sodium 128 (*)    Glucose, Bld 326 (*)    BUN 36 (*)    Creatinine, Ser 1.21 (*)    Total Protein 6.0 (*)    AST 14 (*)    GFR, Estimated 54 (*)    All other components within normal limits  CBC WITH DIFFERENTIAL/PLATELET - Abnormal; Notable for the following components:   Hemoglobin 11.8 (*)    HCT 35.4 (*)    Platelets 92 (*)    Lymphs Abs 0.2 (*)    Abs Immature Granulocytes 1.63 (*)    All other components within normal limits  URINALYSIS, ROUTINE W REFLEX MICROSCOPIC - Abnormal; Notable for the following components:  APPearance HAZY (*)    Glucose, UA 150 (*)    Protein, ur 30 (*)    Nitrite POSITIVE (*)    Leukocytes,Ua MODERATE (*)    WBC, UA >50 (*)    Bacteria, UA RARE (*)    All other components within normal limits  RESP PANEL BY RT-PCR (FLU A&B, COVID) ARPGX2  URINE CULTURE  CULTURE, BLOOD (ROUTINE X 2)  CULTURE, BLOOD (ROUTINE X 2)  LACTIC ACID, PLASMA    EKG None  Radiology DG Chest 2 View  Result Date: 05/28/2021 CLINICAL DATA:  Low-grade fever EXAM: CHEST - 2 VIEW COMPARISON:  06/01/2019 FINDINGS: The heart size and mediastinal contours are within normal limits. Both lungs are clear. The visualized skeletal structures are unremarkable. IMPRESSION: No active cardiopulmonary disease. Electronically Signed   By: Inez Catalina M.D.   On: 05/28/2021 03:04    Procedures Procedures   Medications Ordered in ED Medications  lactated ringers bolus 500 mL (0 mLs Intravenous Stopped 05/28/21 0140)  cefTRIAXone (ROCEPHIN) 2 g in sodium chloride 0.9 % 100 mL  IVPB (0 g Intravenous Stopped 05/28/21 0140)  acetaminophen (TYLENOL) tablet 650 mg (has no administration in time range)  oxyCODONE (Oxy IR/ROXICODONE) immediate release tablet 5 mg (5 mg Oral Given 05/28/21 1856)  HYDROmorphone (DILAUDID) injection 0.5 mg (0.5 mg Intravenous Given 05/28/21 0613)  ondansetron (ZOFRAN) injection 4 mg (has no administration in time range)  lactated ringers infusion ( Intravenous New Bag/Given 05/28/21 0755)  predniSONE (DELTASONE) tablet 5 mg (has no administration in time range)  pantoprazole (PROTONIX) EC tablet 40 mg (has no administration in time range)  mycophenolate (MYFORTIC) EC tablet 360 mg (has no administration in time range)  tacrolimus ER (ENVARSUS XR) tablet 3 mg (has no administration in time range)  gabapentin (NEURONTIN) capsule 300 mg (has no administration in time range)  insulin aspart (novoLOG) injection 0-15 Units (has no administration in time range)  insulin aspart (novoLOG) injection 0-5 Units (has no administration in time range)  insulin glargine-yfgn (SEMGLEE) injection 45 Units (has no administration in time range)  senna-docusate (Senokot-S) tablet 1 tablet (has no administration in time range)  oxyCODONE (Oxy IR/ROXICODONE) immediate release tablet 5 mg (5 mg Oral Given 05/28/21 0407)  acetaminophen (TYLENOL) tablet 650 mg (650 mg Oral Given 05/28/21 0436)    ED Course  I have reviewed the triage vital signs and the nursing notes.  Pertinent labs & imaging results that were available during my care of the patient were reviewed by me and considered in my medical decision making (see chart for details).    MDM Rules/Calculators/A&P                          Patient is a 50 year old Spanish-speaking woman who presents today for evaluation of about 3 days of dysuria, frequency, and urgency and 1 day of body aches, temperatures at home elevated up to 100.6, and what sounds like an episode of rigors. She is undergoing chemotherapy  currently with her last treatment on Wednesday for breast cancer and is additionally immunosuppressed with tacrolimus, mycophenolate, prednisone as she had a renal transplant in 2017.    Lab obtained reviewed, she is not neutropenic with a white count of 9.  She is minimally anemic with a hemoglobin of 11.8.  Platelets are slightly low at 92.  CMP shows hyponatremia with a sodium of 128 which appears consistent with her baseline.  Glucose is elevated at 326, creatinine is  consistent with her baseline at 1.21.  Flu and COVID testing is negative.  UA is concerning for infection, it is nitrite positive with microscopy showing 0-5 red cells, over 50 white cells with rare bacteria and 0-5 squamous epithelial cells.  This combined with her dysuria, frequency and urgency is consistent with UTI. She was given IV Rocephin while in the ER. While in the ER she was afebrile, was not tachycardic consistently or tachypneic and her blood pressures were on the higher side so she did not meet sepsis criteria.  Additionally her lactic acid was not elevated at 1.1.  Urine and blood cultures were both sent.  Given that she does have rigors reported and is immunosuppressed due to her chemotherapy and antirejection regimen I spoke with on-call oncology Dr. Irene Limbo who does feel that it is possible for her counts to drop after the chemo and that she may have not yet reached a nadir.  I discussed options for discharge home with p.o. antibiotics versus admission for IV antibiotics.  Initially patient was leaning much more towards discharge home, however while in the ER she again had an episode of rigors with body wide pain and was feeling poorly.  After this decision is made to admit for supportive care, continued antibiotics and to allow cultures to grow.  I spoke with Dr. Myna Hidalgo who will see patient for admission.  The patient appears reasonably stabilized for admission considering the current resources, flow, and capabilities  available in the ED at this time, and I doubt any other [facility] requiring further screening and/or treatment in the ED prior to admission assuming timely admission and bed placement.  Note: Portions of this report may have been transcribed using voice recognition software. Every effort was made to ensure accuracy; however, inadvertent computerized transcription errors may be present   Final Clinical Impression(s) / ED Diagnoses Final diagnoses:  Immunosuppressed status [facility])  Kidney transplant status  Malignant neoplasm of lower-inner quadrant of left breast in female, estrogen receptor positive ([place])  Acute UTI    Rx / DC Orders ED Discharge Orders     None        Merary, [place], PA-C 05/28/21 0758    Ezequiel Essex, MD 05/28/21 [phone]

## 2021-05-28 NOTE — Progress Notes (Signed)
PHARMACY - PHYSICIAN COMMUNICATION CRITICAL VALUE ALERT - BLOOD CULTURE IDENTIFICATION (BCID)  Cynthia [name] is an 50 y.o. female who presented to [facility] on 05/27/2021 with a chief complaint of fever  Assessment:  ESBL Ecoli bacteremia  Name of physician (or Provider) Contacted: Pok[place]  Current antibiotics: Rocephin  Changes to prescribed antibiotics recommended:  Change to meropenem per Rx dosing  Results for orders placed or performed during the hospital encounter of 05/27/21  Blood Culture ID Panel (Reflexed) (Collected: 05/28/2021 12:23 AM)  Result Value Ref Range   Enterococcus faecalis NOT DETECTED NOT DETECTED   Enterococcus Faecium NOT DETECTED NOT DETECTED   Listeria monocytogenes NOT DETECTED NOT DETECTED   Staphylococcus species NOT DETECTED NOT DETECTED   Staphylococcus aureus (BCID) NOT DETECTED NOT DETECTED   Staphylococcus epidermidis NOT DETECTED NOT DETECTED   Staphylococcus lugdunensis NOT DETECTED NOT DETECTED   Streptococcus species NOT DETECTED NOT DETECTED   Streptococcus agalactiae NOT DETECTED NOT DETECTED   Streptococcus pneumoniae NOT DETECTED NOT DETECTED   Streptococcus pyogenes NOT DETECTED NOT DETECTED   A.calcoaceticus-baumannii NOT DETECTED NOT DETECTED   Bacteroides fragilis NOT DETECTED NOT DETECTED   Enterobacterales DETECTED (A) NOT DETECTED   Enterobacter cloacae complex NOT DETECTED NOT DETECTED   Escherichia coli DETECTED (A) NOT DETECTED   Klebsiella aerogenes NOT DETECTED NOT DETECTED   Klebsiella oxytoca NOT DETECTED NOT DETECTED   Klebsiella pneumoniae NOT DETECTED NOT DETECTED   Proteus species NOT DETECTED NOT DETECTED   Salmonella species NOT DETECTED NOT DETECTED   Serratia marcescens NOT DETECTED NOT DETECTED   Haemophilus influenzae NOT DETECTED NOT DETECTED   Neisseria meningitidis NOT DETECTED NOT DETECTED   Pseudomonas aeruginosa NOT DETECTED NOT DETECTED   Stenotrophomonas maltophilia NOT DETECTED NOT DETECTED    Candida albicans NOT DETECTED NOT DETECTED   Candida auris NOT DETECTED NOT DETECTED   Candida glabrata NOT DETECTED NOT DETECTED   Candida krusei NOT DETECTED NOT DETECTED   Candida parapsilosis NOT DETECTED NOT DETECTED   Candida tropicalis NOT DETECTED NOT DETECTED   Cryptococcus neoformans/gattii NOT DETECTED NOT DETECTED   CTX-M ESBL DETECTED (A) NOT DETECTED   Carbapenem resistance IMP NOT DETECTED NOT DETECTED   Carbapenem resistance KPC NOT DETECTED NOT DETECTED   Carbapenem resistance NDM NOT DETECTED NOT DETECTED   Carbapenem resist OXA 48 LIKE NOT DETECTED NOT DETECTED   Carbapenem resistance VIM NOT DETECTED NOT DETECTED    [name] 05/28/2021  6:09 PM

## 2021-05-29 ENCOUNTER — Other Ambulatory Visit: Payer: Self-pay

## 2021-05-29 ENCOUNTER — Inpatient Hospital Stay (HOSPITAL_COMMUNITY): Payer: Medicare HMO

## 2021-05-29 DIAGNOSIS — Z940 Kidney transplant status: Secondary | ICD-10-CM | POA: Diagnosis not present

## 2021-05-29 DIAGNOSIS — N390 Urinary tract infection, site not specified: Secondary | ICD-10-CM | POA: Diagnosis not present

## 2021-05-29 DIAGNOSIS — I10 Essential (primary) hypertension: Secondary | ICD-10-CM | POA: Diagnosis not present

## 2021-05-29 DIAGNOSIS — E1022 Type 1 diabetes mellitus with diabetic chronic kidney disease: Secondary | ICD-10-CM | POA: Diagnosis not present

## 2021-05-29 LAB — CBC
HCT: 31.7 % — ABNORMAL LOW (ref 36.0–46.0)
Hemoglobin: 10.4 g/dL — ABNORMAL LOW (ref 12.0–15.0)
MCH: 29.9 pg (ref 26.0–34.0)
MCHC: 32.8 g/dL (ref 30.0–36.0)
MCV: 91.1 fL (ref 80.0–100.0)
Platelets: 47 10*3/uL — ABNORMAL LOW (ref 150–400)
RBC: 3.48 MIL/uL — ABNORMAL LOW (ref 3.87–5.11)
RDW: 13.5 % (ref 11.5–15.5)
WBC: 3.9 10*3/uL — ABNORMAL LOW (ref 4.0–10.5)
nRBC: 0 % (ref 0.0–0.2)

## 2021-05-29 LAB — GLUCOSE, CAPILLARY
Glucose-Capillary: 148 mg/dL — ABNORMAL HIGH (ref 70–99)
Glucose-Capillary: 242 mg/dL — ABNORMAL HIGH (ref 70–99)
Glucose-Capillary: 262 mg/dL — ABNORMAL HIGH (ref 70–99)
Glucose-Capillary: 271 mg/dL — ABNORMAL HIGH (ref 70–99)
Glucose-Capillary: 211 mg/dL — ABNORMAL HIGH (ref 70–99)

## 2021-05-29 LAB — BASIC METABOLIC PANEL
Anion gap: 3 — ABNORMAL LOW (ref 5–15)
BUN: 44 mg/dL — ABNORMAL HIGH (ref 6–20)
CO2: 25 mmol/L (ref 22–32)
Calcium: 8.5 mg/dL — ABNORMAL LOW (ref 8.9–10.3)
Chloride: 98 mmol/L (ref 98–111)
Creatinine, Ser: 1.61 mg/dL — ABNORMAL HIGH (ref 0.44–1.00)
GFR, Estimated: 38 mL/min — ABNORMAL LOW (ref >60)
Glucose, Bld: 228 mg/dL — ABNORMAL HIGH (ref 70–99)
Potassium: 5 mmol/L (ref 3.5–5.1)
Sodium: 126 mmol/L — ABNORMAL LOW (ref 135–145)

## 2021-05-29 LAB — HEMOGLOBIN A1C
Hgb A1c MFr Bld: 11.9 % — ABNORMAL HIGH (ref 4.8–5.6)
Mean Plasma Glucose: 294.83 mg/dL

## 2021-05-29 LAB — HIV ANTIBODY (ROUTINE TESTING W REFLEX): HIV Screen 4th Generation wRfx: NONREACTIVE

## 2021-05-29 MED ORDER — SODIUM CHLORIDE 0.9 % IV SOLN
500.0000 mL | Freq: Once | INTRAVENOUS | Status: AC
  Administered 2021-05-29: 500 mL via INTRAVENOUS

## 2021-05-29 MED ORDER — SODIUM CHLORIDE 0.9 % IV SOLN
1.0000 g | Freq: Two times a day (BID) | INTRAVENOUS | Status: DC
  Administered 2021-05-29 – 2021-05-30 (×2): 1 g via INTRAVENOUS
  Filled 2021-05-29 (×3): qty 1

## 2021-05-29 NOTE — Progress Notes (Signed)
Inpatient Diabetes Program Recommendations  AACE/ADA: New Consensus Statement on Inpatient Glycemic Control (2015)  Target Ranges:  Prepandial:   less than 140 mg/dL      Peak postprandial:   less than 180 mg/dL (1-2 hours)      Critically ill patients:  140 - 180 mg/dL   Lab Results  Component Value Date   GLUCAP 303 (H) 05/28/2021   HGBA1C 11.9 (H) 05/29/2021    Review of Glycemic Control  Diabetes history: DM1 Outpatient Diabetes medications: Lantus 65 units QAM, Pred 5 mg QD Current orders for Inpatient glycemic control: Semglee 20 units BID, Novolog 0-15 units TID with meals and 0-5 HS On Pred 5 mg QD  HgbA1C - 11.9%  Inpatient Diabetes Program Recommendations:    Increase Lantus to 25 units BID Add Novolog 4 units TID with meals if eating > 50%  Pt just received pain meds. Will discuss HgbA1C of 11.9% when appropriate.  Continue to follow.  Thank you. Lorenda Peck, RD, LDN, CDE Inpatient Diabetes Coordinator [phone]

## 2021-05-29 NOTE — Progress Notes (Signed)
PROGRESS NOTE  Cynthia [name] KVQ:[id number] DOB: [birth year] DOA: 05/27/2021 PCP: Arthur Holms, NP   LOS: 1 day   Brief narrative: Cynthia [name] is a 50 y.o. female with past medical history of renal transplant, CKD stage IIIa, diabetes mellitus on insulin, breast cancer currently on chemotherapy presented to the hospital with 2 to 3 days history of dysuria and urinary urgency and frequency followed by fever and chills.  Patient was recently treated with docetaxel and cyclophosphamide on 05/24/2021 and subsequently developed urinary symptoms.  She then received  Udenyca on 05/26/21 with fever of 100.6 F.  In the ED, patient was afebrile but tachycardic.  EKG and chest x-ray was unremarkable except for creatinine of 1.2.  Lactic acid was normal.  COVID and influenza was negative.  Blood culture and urine culture was drawn from the ED and patient was given 500 mL of LR and started on IV Rocephin.  Patient was then admitted to the hospital for UTI.  Assessment/Plan:  Principal Problem:   Acute UTI Active Problems:   Diabetes mellitus type I ([place])   History of renal transplant   HTN (hypertension)   Malignant neoplasm of lower-inner quadrant of left breast in female, estrogen receptor positive (HCC)   Stage 3a chronic kidney disease (CKD) (HCC)   Thrombocytopenia (HCC)  ESBL E. coli bacteremia likely secondary to acute cystitis. Patient was initially on IV Rocephin.  This has been changed to meropenem.  Spoke with ID for consultation.  Patient does have history of immunosuppression use including recent chemotherapy.   Hx of renal transplant; CKD IIIa  - Deceased donor kidney recipient in 2017.  Serum creatinine was 1.2 on admission  On tacrolimus mycophenolate and prednisone at home.  Creatinine of 1.6 today.   Breast cancer status postlumpectomy and chemotherapy  received docetaxel and cyclophosphamide on 05/24/21 and Udenyca on 05/26/21 . Patient follows up with oncology as outpatient.     Insulin-dependent DM  Patient has hemoglobin A1c of 11.6 in May 2022.  Continue sliding scale insulin Accu-Cheks diabetic diet.  Latest POC glucose of 262.    Thrombocytopenia  Platelet count of 47 today decreased from 90s.  We will continue to monitor.  No evidence of bleeding at this time.  Recent use of chemotherapy.   Mild hyponatremia. We will continue to monitor.   DVT prophylaxis: SCDs Start: 05/28/21 6433   Code Status: Full code.  Family Communication: Spoke with the patient's daughters at bedside.  Status is: Inpatient  Remains inpatient appropriate because: Need for IV antibiotics, further monitoring.    Consultants: ID consultation  Procedures: None  Anti-infectives:  Meropenem IV  Anti-infectives (From admission, onward)    Start     Dose/Rate Route Frequency Ordered Stop   05/29/21 1600  meropenem (MERREM) 1 g in sodium chloride 0.9 % 100 mL IVPB        1 g 200 mL/hr over 30 Minutes Intravenous Every 12 hours 05/29/21 0802     05/28/21 2000  meropenem (MERREM) 1 g in sodium chloride 0.9 % 100 mL IVPB  Status:  Discontinued        1 g 200 mL/hr over 30 Minutes Intravenous Every 8 hours 05/28/21 1819 05/29/21 0802   05/28/21 0115  cefTRIAXone (ROCEPHIN) 2 g in sodium chloride 0.9 % 100 mL IVPB  Status:  Discontinued        2 g 200 mL/hr over 30 Minutes Intravenous Every 24 hours 05/28/21 0100 05/28/21 1817  Subjective: Today, patient was seen and examined at bedside.  Patient still complains of suprapubic pain and dysuria.  No fever or chills today.  Objective: Vitals:   05/29/21 0819 05/29/21 1214  BP: (!) 116/51 (!) 142/50  Pulse: 83 86  Resp: 16 16  Temp: 98.4 F (36.9 C) 98.4 F (36.9 C)  SpO2: 99% 93%    Intake/Output Summary (Last 24 hours) at 05/29/2021 1227 Last data filed at 05/29/2021 1008 Gross per 24 hour  Intake 2439.72 ml  Output 1 ml  Net 2438.72 ml   Filed Weights   05/27/21 2233 05/29/21 0500  Weight: 93 kg  94.1 kg   Body mass index is 39.2 kg/m.   Physical Exam: GENERAL: Patient is alert awake and oriented. Not in obvious distress.  Obese, on nasal cannula oxygen HENT: No scleral pallor or icterus. Pupils equally reactive to light. Oral mucosa is moist NECK: is supple, no gross swelling noted. CHEST: Clear to auscultation. No crackles or wheezes.  Diminished breath sounds bilaterally. CVS: S1 and S2 heard, no murmur. Regular rate and rhythm.  ABDOMEN: Soft, suprapubic tenderness on palpation.  Bowel sounds are present. EXTREMITIES: Trace lower extremity edema noted. CNS: Cranial nerves are intact. No focal motor deficits. SKIN: warm and dry without rashes.  Data Review: I have personally reviewed the following laboratory data and studies,  CBC: Recent Labs  Lab 05/24/21 1020 05/27/21 2259 05/29/21 0509  WBC 8.4 9.0 3.9*  NEUTROABS 6.2 7.0  --   HGB 11.4* 11.8* 10.4*  HCT 34.7* 35.4* 31.7*  MCV 89.7 90.3 91.1  PLT 185 92* 47*   Basic Metabolic Panel: Recent Labs  Lab 05/24/21 1020 05/27/21 2259 05/29/21 0509  NA 132* 128* 126*  K 4.8 4.9 5.0  CL 101 99 98  CO2 24 22 25   GLUCOSE 285* 326* 228*  BUN 20 36* 44*  CREATININE 1.16* 1.21* 1.61*  CALCIUM 9.3 9.2 8.5*   Liver Function Tests: Recent Labs  Lab 05/24/21 1020 05/27/21 2259  AST 21 14*  ALT 37 26  ALKPHOS 99 114  BILITOT 0.5 0.7  PROT 5.7* 6.0*  ALBUMIN 3.4* 3.5   No results for input(s): LIPASE, AMYLASE in the last 168 hours. No results for input(s): AMMONIA in the last 168 hours. Cardiac Enzymes: No results for input(s): CKTOTAL, CKMB, CKMBINDEX, TROPONINI in the last 168 hours. BNP (last 3 results) No results for input(s): BNP in the last 8760 hours.  ProBNP (last 3 results) No results for input(s): PROBNP in the last 8760 hours.  CBG: Recent Labs  Lab 05/28/21 1300 05/28/21 1646 05/28/21 2135 05/29/21 0816 05/29/21 1211  GLUCAP 148* 246* 303* 242* 262*   Recent Results (from the past  240 hour(s))  Resp Panel by RT-PCR (Flu A&B, Covid) Nasopharyngeal Swab     Status: None   Collection Time: 05/27/21 11:52 PM   Specimen: Nasopharyngeal Swab; Nasopharyngeal(NP) swabs in vial transport medium  Result Value Ref Range Status   SARS Coronavirus 2 by RT PCR NEGATIVE NEGATIVE Final    Comment: (NOTE) SARS-CoV-2 target nucleic acids are NOT DETECTED.  The SARS-CoV-2 RNA is generally detectable in upper respiratory specimens during the acute phase of infection. The lowest concentration of SARS-CoV-2 viral copies this assay can detect is 138 copies/mL. A negative result does not preclude SARS-Cov-2 infection and should not be used as the sole basis for treatment or other patient management decisions. A negative result may occur with  improper specimen collection/handling, submission of specimen other  than nasopharyngeal swab, presence of viral mutation(s) within the areas targeted by this assay, and inadequate number of viral copies(<138 copies/mL). A negative result must be combined with clinical observations, patient history, and epidemiological information. The expected result is Negative.  Fact Sheet for Patients:  EntrepreneurPulse.com.au  Fact Sheet for Healthcare Providers:  IncredibleEmployment.be  This test is no t yet approved or cleared by the [place] FDA and  has been authorized for detection and/or diagnosis of SARS-CoV-2 by FDA under an Emergency Use Authorization (EUA). This EUA will remain  in effect (meaning this test can be used) for the duration of the COVID-19 declaration under Section 564(b)(1) of the Act, 21 U.S.C.section 360bbb-3(b)(1), unless the authorization is terminated  or revoked sooner.       Influenza A by PCR NEGATIVE NEGATIVE Final   Influenza B by PCR NEGATIVE NEGATIVE Final    Comment: (NOTE) The Xpert Xpress SARS-CoV-2/FLU/RSV plus assay is intended as an aid in the diagnosis of influenza  from Nasopharyngeal swab specimens and should not be used as a sole basis for treatment. Nasal washings and aspirates are unacceptable for Xpert Xpress SARS-CoV-2/FLU/RSV testing.  Fact Sheet for Patients: EntrepreneurPulse.com.au  Fact Sheet for Healthcare Providers: IncredibleEmployment.be  This test is not yet approved or cleared by the [place] FDA and has been authorized for detection and/or diagnosis of SARS-CoV-2 by FDA under an Emergency Use Authorization (EUA). This EUA will remain in effect (meaning this test can be used) for the duration of the COVID-19 declaration under Section 564(b)(1) of the Act, 21 U.S.C. section 360bbb-3(b)(1), unless the authorization is terminated or revoked.  Performed at [facility], [place] [address] [place], [place] [address]   Urine Culture     Status: Abnormal (Preliminary result)   Collection Time: 05/28/21 12:17 AM   Specimen: Urine, Clean Catch  Result Value Ref Range Status   Specimen Description   Final    URINE, CLEAN CATCH Performed at [facility], [place] [address], [place], [place] [address]    Special Requests   Final    NONE Performed at [facility], [place] [address] [place], [place] [address]    Culture (A)  Final    >=100,000 COLONIES/mL ESCHERICHIA COLI SUSCEPTIBILITIES TO FOLLOW Performed at [facility], [place] [address] [place], [place] [address]    Report Status PENDING  Incomplete  Culture, blood (routine x 2)     Status: Abnormal (Preliminary result)   Collection Time: 05/28/21 12:23 AM   Specimen: BLOOD  Result Value Ref Range Status   Specimen Description   Final    BLOOD RIGHT ANTECUBITAL Performed at [place] [address] [place], [place] [address]    Special Requests   Final    BOTTLES DRAWN AEROBIC AND ANAEROBIC Blood Culture results may not be optimal due to an excessive  volume of blood received in culture bottles Performed at [place] [address] [place], [place] [address]    Culture  Setup Time   Final    GRAM NEGATIVE RODS IN BOTH AEROBIC AND ANAEROBIC BOTTLES CRITICAL RESULT CALLED TO, READ BACK BY AND VERIFIED WITH: PHARM D J.LEGGE ON [id number] AT 1804 BY E.PARRISH    Culture (A)  Final    ESCHERICHIA COLI SUSCEPTIBILITIES TO FOLLOW Performed at [facility], [place] [address], [place], [place] [address]    Report Status PENDING  Incomplete  Blood Culture ID Panel (Reflexed)  Status: Abnormal   Collection Time: 05/28/21 12:23 AM  Result Value Ref Range Status   Enterococcus faecalis NOT DETECTED NOT DETECTED Final   Enterococcus Faecium NOT DETECTED NOT DETECTED Final   Listeria monocytogenes NOT DETECTED NOT DETECTED Final   Staphylococcus species NOT DETECTED NOT DETECTED Final   Staphylococcus aureus (BCID) NOT DETECTED NOT DETECTED Final   Staphylococcus epidermidis NOT DETECTED NOT DETECTED Final   Staphylococcus lugdunensis NOT DETECTED NOT DETECTED Final   Streptococcus species NOT DETECTED NOT DETECTED Final   Streptococcus agalactiae NOT DETECTED NOT DETECTED Final   Streptococcus pneumoniae NOT DETECTED NOT DETECTED Final   Streptococcus pyogenes NOT DETECTED NOT DETECTED Final   A.calcoaceticus-baumannii NOT DETECTED NOT DETECTED Final   Bacteroides fragilis NOT DETECTED NOT DETECTED Final   Enterobacterales DETECTED (A) NOT DETECTED Final    Comment: Enterobacterales represent a large order of gram negative bacteria, not a single organism. CRITICAL RESULT CALLED TO, READ BACK BY AND VERIFIED WITH: PHARM D J.LEGGE ON [id number] AT 1804 BY E.PARRISH    Enterobacter cloacae complex NOT DETECTED NOT DETECTED Final   Escherichia coli DETECTED (A) NOT DETECTED Final    Comment: CRITICAL RESULT CALLED TO, READ BACK BY AND VERIFIED WITH: PHARM D J.LEGGE ON [id number] AT 1804 BY E.PARRISH    Klebsiella  aerogenes NOT DETECTED NOT DETECTED Final   Klebsiella oxytoca NOT DETECTED NOT DETECTED Final   Klebsiella pneumoniae NOT DETECTED NOT DETECTED Final   Proteus species NOT DETECTED NOT DETECTED Final   Salmonella species NOT DETECTED NOT DETECTED Final   Serratia marcescens NOT DETECTED NOT DETECTED Final   Haemophilus influenzae NOT DETECTED NOT DETECTED Final   Neisseria meningitidis NOT DETECTED NOT DETECTED Final   Pseudomonas aeruginosa NOT DETECTED NOT DETECTED Final   Stenotrophomonas maltophilia NOT DETECTED NOT DETECTED Final   Candida albicans NOT DETECTED NOT DETECTED Final   Candida auris NOT DETECTED NOT DETECTED Final   Candida glabrata NOT DETECTED NOT DETECTED Final   Candida krusei NOT DETECTED NOT DETECTED Final   Candida parapsilosis NOT DETECTED NOT DETECTED Final   Candida tropicalis NOT DETECTED NOT DETECTED Final   Cryptococcus neoformans/gattii NOT DETECTED NOT DETECTED Final   CTX-M ESBL DETECTED (A) NOT DETECTED Final    Comment: CRITICAL RESULT CALLED TO, READ BACK BY AND VERIFIED WITH: PHARM D J.LEGGE ON [id number] AT 1804 BY E.PARRISH (NOTE) Extended spectrum beta-lactamase detected. Recommend a carbapenem as initial therapy.      Carbapenem resistance IMP NOT DETECTED NOT DETECTED Final   Carbapenem resistance KPC NOT DETECTED NOT DETECTED Final   Carbapenem resistance NDM NOT DETECTED NOT DETECTED Final   Carbapenem resist OXA 48 LIKE NOT DETECTED NOT DETECTED Final   Carbapenem resistance VIM NOT DETECTED NOT DETECTED Final    Comment: Performed at [facility], [place] [address], [place], [place] [address]  Culture, blood (routine x 2)     Status: Abnormal (Preliminary result)   Collection Time: 05/28/21 12:28 AM   Specimen: BLOOD  Result Value Ref Range Status   Specimen Description   Final    BLOOD BLOOD RIGHT HAND Performed at [place] [address], [place], [place] [address]    Special Requests   Final     BOTTLES DRAWN AEROBIC AND ANAEROBIC Blood Culture adequate volume Performed at [place] [address], [place], [place] [address]    Culture  Setup Time   Final    GRAM NEGATIVE RODS IN BOTH AEROBIC  AND ANAEROBIC BOTTLES CRITICAL VALUE NOTED.  VALUE IS CONSISTENT WITH PREVIOUSLY REPORTED AND CALLED VALUE.    Culture (A)  Final    ESCHERICHIA COLI CULTURE REINCUBATED FOR BETTER GROWTH Performed at [facility], [place] [address] [place], [place] [address]    Report Status PENDING  Incomplete     Studies: DG Chest 2 View  Result Date: 05/28/2021 CLINICAL DATA:  Low-grade fever EXAM: CHEST - 2 VIEW COMPARISON:  06/01/2019 FINDINGS: The heart size and mediastinal contours are within normal limits. Both lungs are clear. The visualized skeletal structures are unremarkable. IMPRESSION: No active cardiopulmonary disease. Electronically Signed   By: Inez Catalina M.D.   On: 05/28/2021 03:04      Flora Lipps, MD  [facility] 05/29/2021  If 7PM-7AM, please contact night-coverage

## 2021-05-29 NOTE — Progress Notes (Signed)
   05/28/21 2141  Assess: MEWS Score  Temp (!) 102.4 F (39.1 C)  BP (!) 186/64  Pulse Rate (!) 107  SpO2 92 %  O2 Device Room Air  Assess: MEWS Score  MEWS Temp 2  MEWS Systolic 0  MEWS Pulse 1  MEWS RR 0  MEWS LOC 0  MEWS Score 3  MEWS Score Color Yellow  Assess: if the MEWS score is Yellow or Red  Were vital signs taken at a resting state? Yes  Focused Assessment Change from prior assessment (see assessment flowsheet)  Does the patient meet 2 or more of the SIRS criteria? Yes  Does the patient have a confirmed or suspected source of infection? Yes  Provider and Rapid Response Notified? Yes ([place] NP aware)  MEWS guidelines implemented *See Row Information* Yes  Treat  MEWS Interventions Administered prn meds/treatments  Take Vital Signs  Increase Vital Sign Frequency  Yellow: Q 2hr X 2 then Q 4hr X 2, if remains yellow, continue Q 4hrs  Escalate  MEWS: Escalate Yellow: discuss with charge nurse/RN and consider discussing with provider and RRT  Notify: Charge Nurse/RN  Name of Charge Nurse/RN Notified Watson, RN  Date Charge Nurse/RN Notified 05/28/21  Time Charge Nurse/RN Notified 56  Notify: Provider  Provider Name/Title Kennon Holter, NP  Date Provider Notified 05/28/21  Time Provider Notified 2144  Notification Type Page  Notification Reason Change in status  Provider response No new orders  Date of Provider Response 05/28/21  Time of Provider Response 2144  Document  Patient Outcome Not stable and remains on department  Progress note created (see row info) Yes  Assess: SIRS CRITERIA  SIRS Temperature  1  SIRS Pulse 1  SIRS Respirations  0  SIRS WBC 0  SIRS Score Sum  2

## 2021-05-29 NOTE — Consult Note (Signed)
[facility]                                                                                        Patient Identification: Patient Name: Cynthia [name] MRN: [id number] [place] Date: 05/27/2021 10:27 PM Today's Date: 05/29/2021 Reason for consult: E. coli bacteremia Requesting provider: Corrie Mckusick Pokhrel  Principal Problem:   Acute UTI Active Problems:   Diabetes mellitus type I ([place])   History of renal transplant   HTN (hypertension)   Malignant neoplasm of lower-inner quadrant of left breast in female, estrogen receptor positive ([place])   Stage 3a chronic kidney disease (CKD) ([place])   Thrombocytopenia ([place])   Antibiotics: Ceftriaxone 12/3                    Meropenem 12/4  Lines/Hardware: Left arm AV fistula  Assessment E. coli bacteremia, ESBL 2/2 Complicated UTI  Breast ca s/p lumpectomy and  undergoing active chemotherapy  Pancytopenia in the setting of recent chemotherapy DDKT in 2017 on immunosuppression ( Tacrolimus, Prednisone and Mycophenolate) with CKD- Cr uptrending   DM, Uncontrolled - last a1c 11.9  Recommendations  Continue meropenem  Fu E coli sensitivities  US kidneys given renal angle tenderness and immunocompromised patient to r/o renal abscess/Pyelo Consider Nephrology consult given AKI on CKD and needs to adjust tacrolimus  CBC w diff tomorrow( ordered ) Following   Rest of the management as per the primary team. Please call with questions or concerns.  Thank you for the consult  Rosiland Oz, MD Infectious Disease Physician [facility] [address] [place], [place] [address] Phone: [phone]  Fax: [phone]  __________________________________________________________________________________________________________ HPI and Hospital Course: 50 year old female with PMH of DM, previous ESRD on HD  status post kidney transplant in 2017 on immunosuppressants, breast CA on chemotherapy who presented to the ED yesterday with fever/chills, dysuria associated with urinary frequency and urgency and body aches.  Spoke with her daughter at beside. Patient was treated with docetaxel and cyclophosphamide on 05/24/21  which was followed by dysuria, urinary frequency, urgency the following day, UA was collected on Onc appt on 11/30, received Undenyca on 12/2 which was followed by fever the following day. She was told to seek immediate attention if she has fever and hence, came to ED.  At ED, patient is febrile with T-max 102.4.  WBC 9.0 Labs remarkable for NA 128, glucose 326, creatinine 1.21, platelets 92 UA with positive nitrites, moderate leukocytes and WBC greater than 50 12/4 blood cultures E. coli in both sets. ESBL gene detected in BCID 12/4 urine cultures greater than 100 K E. Coli  ROS: General- fevers and chills + HEENT - Denies headache, blurry vision, neck pain, sinus pain Chest - Denies any chest pain, SOB or cough CVS- Denies any dizziness/lightheadedness, syncopal attacks, palpitations Abdomen- Denies any abdominal pain, hematochezia and diarrhea, nausea and vomiting +, suprapubic pain + Neuro - Denies any weakness, numbness, tingling sensation Psych - Denies any changes in mood irritability or depressive symptoms GU- Urinary burning, dysuria,  increased frequency of urination+ Skin - denies any rashes/lesions  MSK - denies any joint pain/swelling or restricted ROM   Past Medical History:  Diagnosis Date   Allergy    pollen   Anemia    when on dialysis   Anxiety    BRCA2 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 03/01/2021   Breast cancer [facility])    Cataract    surgical repair bilateral   Depression    ESRD on hemodialysis ([place])    Home HD 5x per week- not on dialysis now had tramsplant 4/17   Family history of ovarian cancer 02/22/2021   GERD (gastroesophageal reflux disease)     Hearing loss 2017   right ear   History of blood transfusion    transfusion reaction   Hyperlipidemia    Hypertension    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 per pt-dr levy also II   Kidney transplant recipient    Neuromuscular disorder ([place])    NEUROPATHY   Sleep apnea      Scheduled Meds:  gabapentin  600 mg Oral Daily   insulin aspart  0-15 Units Subcutaneous TID WC   insulin aspart  0-5 Units Subcutaneous QHS   insulin glargine-yfgn  20 Units Subcutaneous BID   mycophenolate  360 mg Oral BID   pantoprazole  40 mg Oral Daily   predniSONE  5 mg Oral Daily   tacrolimus  3 mg Oral Daily   Continuous Infusions:  lactated ringers Stopped (05/28/21 0140)   meropenem (MERREM) IV     PRN Meds:.acetaminophen, ondansetron (ZOFRAN) IV, oxyCODONE, senna-docusate  Allergies  Allergen Reactions   Other Rash    Burns skin.  Please use paper tape only Burns skin.  Please use paper tape only   Docetaxel Nausea Only    Diaphoretic.  Patient had hypersensitivity reaction to docetaxel.  See progress note from 05/24/2021.  Patient able to complete infusion.   Norvasc [Amlodipine] Swelling   Tape Other (See Comments)    Burns skin.  Please use paper tape only   Social History   Socioeconomic History   Marital status: Legally Separated    Spouse name: Not on file   Number of children: 3   Years of education: 9   Highest education level: Not on file  Occupational History   Occupation: disabled  Tobacco Use   Smoking status: Never   Smokeless tobacco: Never  Vaping Use   Vaping Use: Never used  Substance and Sexual Activity   Alcohol use: Yes    Comment: Socially   Drug use: No   Sexual activity: Yes    Birth control/protection: None  Other Topics Concern   Not on file  Social History Narrative   Lives in home with daughters, grand daughter, son-in-law   Caffeine use - 1 cup coffee sometimes    Social Determinants  of Health   Financial Resource Strain: High Risk   Difficulty of Paying Living Expenses: Hard  Food Insecurity: Food Insecurity Present   Worried About Running Out of Food in the Last Year: Sometimes true   Ran Out of Food in the Last Year: Sometimes true  Transportation Needs: No Transportation Needs   Lack of Transportation (Medical): No   Lack of Transportation (Non-Medical): No  Physical Activity: Not on file  Stress: Not on file  Social Connections: Not on file  Intimate Partner Violence: Not on file   Family History  Problem Relation Age of Onset   Diabetes Mother    Kidney  disease Mother    Diabetes Father    Heart disease Father    Kidney disease Father    Hypertension Father    Diabetes Sister    Asthma Sister    Lupus Sister    Diabetes Brother    Diabetes Brother    Diabetes Brother    Diabetes Brother    Cancer Maternal Grandmother 13       ovarian cancer   Diabetes Maternal Grandfather    Diabetes Paternal Grandmother    Colon cancer Neg Hx    Colon polyps Neg Hx    Esophageal cancer Neg Hx    Rectal cancer Neg Hx    Stomach cancer Neg Hx     Vitals BP (!) 116/51 (BP Location: Right Arm)   Pulse 83   Temp 98.4 F (36.9 C) (Oral)   Resp 16   Ht '5\' 1"'  (1.549 m)   Wt 94.1 kg   SpO2 99%   BMI 39.20 kg/m    Physical Exam Constitutional:  Obese lady lying in bed    Comments:   Cardiovascular:     Rate and Rhythm: Normal rate and regular rhythm.     Heart sounds: systolic murmur+  Pulmonary:     Effort: on nasal cannula    Comments: clear lung fields bilaterally  Abdominal:     Palpations: Abdomen is soft.     Tenderness: minimal tenderness in the suprapubic region  Musculoskeletal:        General: No swelling or tenderness.   Skin:    Comments: No lesions or rashes  Neurological:     General: Grossly non focal, calm and cooperative   Psychiatric:        Mood and Affect: Mood normal.   Pertinent Microbiology Results for orders  placed or performed during the hospital encounter of 05/27/21  Resp Panel by RT-PCR (Flu A&B, Covid) Nasopharyngeal Swab     Status: None   Collection Time: 05/27/21 11:52 PM   Specimen: Nasopharyngeal Swab; Nasopharyngeal(NP) swabs in vial transport medium  Result Value Ref Range Status   SARS Coronavirus 2 by RT PCR NEGATIVE NEGATIVE Final    Comment: (NOTE) SARS-CoV-2 target nucleic acids are NOT DETECTED.  The SARS-CoV-2 RNA is generally detectable in upper respiratory specimens during the acute phase of infection. The lowest concentration of SARS-CoV-2 viral copies this assay can detect is 138 copies/mL. A negative result does not preclude SARS-Cov-2 infection and should not be used as the sole basis for treatment or other patient management decisions. A negative result may occur with  improper specimen collection/handling, submission of specimen other than nasopharyngeal swab, presence of viral mutation(s) within the areas targeted by this assay, and inadequate number of viral copies(<138 copies/mL). A negative result must be combined with clinical observations, patient history, and epidemiological information. The expected result is Negative.  Fact Sheet for Patients:  EntrepreneurPulse.com.au  Fact Sheet for Healthcare Providers:  IncredibleEmployment.be  This test is no t yet approved or cleared by the [place] FDA and  has been authorized for detection and/or diagnosis of SARS-CoV-2 by FDA under an Emergency Use Authorization (EUA). This EUA will remain  in effect (meaning this test can be used) for the duration of the COVID-19 declaration under Section 564(b)(1) of the Act, 21 U.S.C.section 360bbb-3(b)(1), unless the authorization is terminated  or revoked sooner.       Influenza A by PCR NEGATIVE NEGATIVE Final   Influenza B by PCR NEGATIVE NEGATIVE Final  Comment: (NOTE) The Xpert Xpress SARS-CoV-2/FLU/RSV plus assay is  intended as an aid in the diagnosis of influenza from Nasopharyngeal swab specimens and should not be used as a sole basis for treatment. Nasal washings and aspirates are unacceptable for Xpert Xpress SARS-CoV-2/FLU/RSV testing.  Fact Sheet for Patients: EntrepreneurPulse.com.au  Fact Sheet for Healthcare Providers: IncredibleEmployment.be  This test is not yet approved or cleared by the [place] FDA and has been authorized for detection and/or diagnosis of SARS-CoV-2 by FDA under an Emergency Use Authorization (EUA). This EUA will remain in effect (meaning this test can be used) for the duration of the COVID-19 declaration under Section 564(b)(1) of the Act, 21 U.S.C. section 360bbb-3(b)(1), unless the authorization is terminated or revoked.  Performed at [facility], [place] [address] [place], [place] [address]   Urine Culture     Status: Abnormal (Preliminary result)   Collection Time: 05/28/21 12:17 AM   Specimen: Urine, Clean Catch  Result Value Ref Range Status   Specimen Description   Final    URINE, CLEAN CATCH Performed at [facility], [place] [address] [place], [place] [address]    Special Requests   Final    NONE Performed at [facility], [place] [address] [place], [place] [address]    Culture (A)  Final    >=100,000 COLONIES/mL ESCHERICHIA COLI SUSCEPTIBILITIES TO FOLLOW Performed at [facility], [place] [address], [place], [place] [address]    Report Status PENDING  Incomplete  Culture, blood (routine x 2)     Status: Abnormal (Preliminary result)   Collection Time: 05/28/21 12:23 AM   Specimen: BLOOD  Result Value Ref Range Status   Specimen Description   Final    BLOOD RIGHT ANTECUBITAL Performed at [place] [address] [place], [place] [address]    Special Requests   Final    BOTTLES DRAWN AEROBIC AND ANAEROBIC Blood Culture  results may not be optimal due to an excessive volume of blood received in culture bottles Performed at [place] [address] [place], [place] [address]    Culture  Setup Time   Final    GRAM NEGATIVE RODS IN BOTH AEROBIC AND ANAEROBIC BOTTLES CRITICAL RESULT CALLED TO, READ BACK BY AND VERIFIED WITH: PHARM D J.LEGGE ON [id number] AT 1804 BY E.PARRISH    Culture (A)  Final    ESCHERICHIA COLI SUSCEPTIBILITIES TO FOLLOW Performed at [facility], [place] [address], [place], [place] [address]    Report Status PENDING  Incomplete  Blood Culture ID Panel (Reflexed)     Status: Abnormal   Collection Time: 05/28/21 12:23 AM  Result Value Ref Range Status   Enterococcus faecalis NOT DETECTED NOT DETECTED Final   Enterococcus Faecium NOT DETECTED NOT DETECTED Final   Listeria monocytogenes NOT DETECTED NOT DETECTED Final   Staphylococcus species NOT DETECTED NOT DETECTED Final   Staphylococcus aureus (BCID) NOT DETECTED NOT DETECTED Final   Staphylococcus epidermidis NOT DETECTED NOT DETECTED Final   Staphylococcus lugdunensis NOT DETECTED NOT DETECTED Final   Streptococcus species NOT DETECTED NOT DETECTED Final   Streptococcus agalactiae NOT DETECTED NOT DETECTED Final   Streptococcus pneumoniae NOT DETECTED NOT DETECTED Final   Streptococcus pyogenes NOT DETECTED NOT DETECTED Final   A.calcoaceticus-baumannii NOT DETECTED NOT DETECTED Final   Bacteroides fragilis NOT DETECTED NOT DETECTED Final   Enterobacterales DETECTED (A) NOT DETECTED Final    Comment: Enterobacterales represent a large  order of gram negative bacteria, not a single organism. CRITICAL RESULT CALLED TO, READ BACK BY AND VERIFIED WITH: PHARM D J.LEGGE ON [id number] AT 1804 BY E.PARRISH    Enterobacter cloacae complex NOT DETECTED NOT DETECTED Final   Escherichia coli DETECTED (A) NOT DETECTED Final    Comment: CRITICAL RESULT CALLED TO, READ BACK BY AND VERIFIED WITH: PHARM D J.LEGGE ON  [id number] AT 1804 BY E.PARRISH    Klebsiella aerogenes NOT DETECTED NOT DETECTED Final   Klebsiella oxytoca NOT DETECTED NOT DETECTED Final   Klebsiella pneumoniae NOT DETECTED NOT DETECTED Final   Proteus species NOT DETECTED NOT DETECTED Final   Salmonella species NOT DETECTED NOT DETECTED Final   Serratia marcescens NOT DETECTED NOT DETECTED Final   Haemophilus influenzae NOT DETECTED NOT DETECTED Final   Neisseria meningitidis NOT DETECTED NOT DETECTED Final   Pseudomonas aeruginosa NOT DETECTED NOT DETECTED Final   Stenotrophomonas maltophilia NOT DETECTED NOT DETECTED Final   Candida albicans NOT DETECTED NOT DETECTED Final   Candida auris NOT DETECTED NOT DETECTED Final   Candida glabrata NOT DETECTED NOT DETECTED Final   Candida krusei NOT DETECTED NOT DETECTED Final   Candida parapsilosis NOT DETECTED NOT DETECTED Final   Candida tropicalis NOT DETECTED NOT DETECTED Final   Cryptococcus neoformans/gattii NOT DETECTED NOT DETECTED Final   CTX-M ESBL DETECTED (A) NOT DETECTED Final    Comment: CRITICAL RESULT CALLED TO, READ BACK BY AND VERIFIED WITH: PHARM D J.LEGGE ON [id number] AT 1804 BY E.PARRISH (NOTE) Extended spectrum beta-lactamase detected. Recommend a carbapenem as initial therapy.      Carbapenem resistance IMP NOT DETECTED NOT DETECTED Final   Carbapenem resistance KPC NOT DETECTED NOT DETECTED Final   Carbapenem resistance NDM NOT DETECTED NOT DETECTED Final   Carbapenem resist OXA 48 LIKE NOT DETECTED NOT DETECTED Final   Carbapenem resistance VIM NOT DETECTED NOT DETECTED Final    Comment: Performed at [facility], [place] [address] [place] [address] [place], [place] [address]  Culture, blood (routine x 2)     Status: Abnormal (Preliminary result)   Collection Time: 05/28/21 12:28 AM   Specimen: BLOOD  Result Value Ref Range Status   Specimen Description   Final    BLOOD BLOOD RIGHT HAND Performed at [place] [address] [place], [place]  [address]    Special Requests   Final    BOTTLES DRAWN AEROBIC AND ANAEROBIC Blood Culture adequate volume Performed at [place] [address], [place], [place] [address]    Culture  Setup Time   Final    GRAM NEGATIVE RODS IN BOTH AEROBIC AND ANAEROBIC BOTTLES CRITICAL VALUE NOTED.  VALUE IS CONSISTENT WITH PREVIOUSLY REPORTED AND CALLED VALUE.    Culture (A)  Final    ESCHERICHIA COLI CULTURE REINCUBATED FOR BETTER GROWTH Performed at [facility], [place] [address], [place],  [address]    Report Status PENDING  Incomplete    Pertinent Lab seen by me: CBC Latest Ref Rng & Units 05/29/2021 05/27/2021 05/24/2021  WBC 4.0 - 10.5 K/uL 3.9(L) 9.0 8.4  Hemoglobin 12.0 - 15.0 g/dL 10.4(L) 11.8(L) 11.4(L)  Hematocrit 36.0 - 46.0 % 31.7(L) 35.4(L) 34.7(L)  Platelets 150 - 400 K/uL 47(L) 92(L) 185   CMP Latest Ref Rng & Units 05/29/2021 05/27/2021 05/24/2021  Glucose 70 - 99 mg/dL 228(H) 326(H) 285(H)  BUN 6 - 20 mg/dL 44(H) 36(H) 20  Creatinine 0.44 - 1.00 mg/dL 1.61(H) 1.21(H) 1.16(H)  Sodium 135 -  145 mmol/L 126(L) 128(L) 132(L)  Potassium 3.5 - 5.1 mmol/L 5.0 4.9 4.8  Chloride 98 - 111 mmol/L 98 99 101  CO2 22 - 32 mmol/L '25 22 24  ' Calcium 8.9 - 10.3 mg/dL 8.5(L) 9.2 9.3  Total Protein 6.5 - 8.1 g/dL - 6.0(L) 5.7(L)  Total Bilirubin 0.3 - 1.2 mg/dL - 0.7 0.5  Alkaline Phos 38 - 126 U/L - 114 99  AST 15 - 41 U/L - 14(L) 21  ALT 0 - 44 U/L - 26 37    Pertinent Imagings/Other Imagings Plain films and CT images have been personally visualized and interpreted; radiology reports have been reviewed. Decision making incorporated into the Impression / Recommendations.  DG Chest 2 View  Result Date: 05/28/2021 CLINICAL DATA:  Low-grade fever EXAM: CHEST - 2 VIEW COMPARISON:  06/01/2019 FINDINGS: The heart size and mediastinal contours are within normal limits. Both lungs are clear. The visualized skeletal structures are unremarkable. IMPRESSION: No active  cardiopulmonary disease. Electronically Signed   By: Inez Catalina M.D.   On: 05/28/2021 03:04      I spent more than 70  minutes for this patient encounter including review of prior medical records/discussing diagnostics and treatment plan with the patient/family/coordinate care with primary/other specialits with greater than 50% of time in face to face encounter.   Electronically signed by:   Rosiland Oz, MD Infectious Disease Physician [facility] Infect[facility] Disease Pager: [phone]

## 2021-05-29 NOTE — Progress Notes (Signed)
PHARMACY NOTE:  ANTIMICROBIAL RENAL DOSAGE ADJUSTMENT  Current antimicrobial regimen includes a mismatch between antimicrobial dosage and estimated renal function.  As per policy approved by the Pharmacy & Therapeutics and Medical Executive Committees, the antimicrobial dosage will be adjusted accordingly.  Current antimicrobial dosage:  Meropenem 1 gm every 8 hours   Indication: ESBL E coli bacteremia  Renal Function:  Estimated Creatinine Clearance: 42.8 mL/min (A) (by C-G formula based on SCr of 1.61 mg/dL (H)). []      On intermittent HD, scheduled: []      On CRRT    Antimicrobial dosage has been changed to:  Meropenem 1 gm every 12 hours   Additional comments:Scr bumped- Adjusting merrem for new renal function    Thank you for allowing pharmacy to be a part of this patient's care.  Jimmy Footman, PharmD, BCPS, BCIDP Infectious Diseases Clinical Pharmacist Phone: [phone] 05/29/2021 8:03 AM

## 2021-05-29 NOTE — Progress Notes (Signed)
   05/28/21 2335  Assess: MEWS Score  Temp (!) 102.9 F (39.4 C)  BP (!) 177/67  Pulse Rate (!) 117  Resp (!) 22  SpO2 90 %  Assess: MEWS Score  MEWS Temp 2  MEWS Systolic 0  MEWS Pulse 2  MEWS RR 1  MEWS LOC 0  MEWS Score 5  MEWS Score Color Red  Assess: if the MEWS score is Yellow or Red  Were vital signs taken at a resting state? Yes  Focused Assessment Change from prior assessment (see assessment flowsheet)  Does the patient meet 2 or more of the SIRS criteria? Yes  Does the patient have a confirmed or suspected source of infection? Yes  Provider and Rapid Response Notified? Yes  MEWS guidelines implemented *See Row Information* Yes  Treat  MEWS Interventions Administered prn meds/treatments (ice)  Complains of Fever  Interventions Cold pack;Relaxation  Patients response to intervention Unchanged  Take Vital Signs  Increase Vital Sign Frequency  Red: Q 1hr X 4 then Q 4hr X 4, if remains red, continue Q 4hrs  Escalate  MEWS: Escalate Red: discuss with charge nurse/RN and provider, consider discussing with RRT  Notify: Charge Nurse/RN  Name of Charge Nurse/RN Notified Doctor, general practice  Date Charge Nurse/RN Notified 05/28/21  Time Charge Nurse/RN Notified 2345  Notify: Provider  Provider Name/Title Kennon Holter, NP  Date Provider Notified 05/29/21  Time Provider Notified 0000  Notification Type Page  Notification Reason Change in status  Provider response See new orders  Date of Provider Response 05/29/21  Time of Provider Response 0015  Notify: Rapid Response  Name of Rapid Response RN Notified Erin RN  Date Rapid Response Notified 05/28/21  Time Rapid Response Notified 7048  Document  Patient Outcome Not stable and remains on department  Progress note created (see row info) Yes  Assess: SIRS CRITERIA  SIRS Temperature  1  SIRS Pulse 1  SIRS Respirations  1  SIRS WBC 0  SIRS Score Sum  3

## 2021-05-30 ENCOUNTER — Ambulatory Visit: Payer: Medicare HMO | Admitting: Pharmacist

## 2021-05-30 ENCOUNTER — Ambulatory Visit: Payer: Medicare HMO

## 2021-05-30 DIAGNOSIS — I10 Essential (primary) hypertension: Secondary | ICD-10-CM | POA: Diagnosis not present

## 2021-05-30 DIAGNOSIS — E1022 Type 1 diabetes mellitus with diabetic chronic kidney disease: Secondary | ICD-10-CM | POA: Diagnosis not present

## 2021-05-30 DIAGNOSIS — N390 Urinary tract infection, site not specified: Secondary | ICD-10-CM | POA: Diagnosis not present

## 2021-05-30 DIAGNOSIS — A499 Bacterial infection, unspecified: Secondary | ICD-10-CM

## 2021-05-30 DIAGNOSIS — Z940 Kidney transplant status: Secondary | ICD-10-CM | POA: Diagnosis not present

## 2021-05-30 DIAGNOSIS — Z1612 Extended spectrum beta lactamase (ESBL) resistance: Secondary | ICD-10-CM

## 2021-05-30 LAB — CBC WITH DIFFERENTIAL/PLATELET
Abs Immature Granulocytes: 0.3 10*3/uL — ABNORMAL HIGH (ref 0.00–0.07)
Abs Immature Granulocytes: 0.1 10*3/uL — ABNORMAL HIGH (ref 0.00–0.07)
Basophils Absolute: 0.1 10*3/uL (ref 0.0–0.1)
Basophils Absolute: 0.1 10*3/uL (ref 0.0–0.1)
Basophils Relative: 2 %
Basophils Relative: 2 %
Eosinophils Absolute: 0.3 10*3/uL (ref 0.0–0.5)
Eosinophils Absolute: 0.2 10*3/uL (ref 0.0–0.5)
Eosinophils Relative: 7 %
Eosinophils Relative: 5 %
HCT: 35.2 % — ABNORMAL LOW (ref 36.0–46.0)
HCT: 32.8 % — ABNORMAL LOW (ref 36.0–46.0)
Hemoglobin: 11.7 g/dL — ABNORMAL LOW (ref 12.0–15.0)
Hemoglobin: 10.7 g/dL — ABNORMAL LOW (ref 12.0–15.0)
Immature Granulocytes: 7 %
Immature Granulocytes: 2 %
Lymphocytes Relative: 12 %
Lymphocytes Relative: 6 %
Lymphs Abs: 0.5 10*3/uL — ABNORMAL LOW (ref 0.7–4.0)
Lymphs Abs: 0.3 10*3/uL — ABNORMAL LOW (ref 0.7–4.0)
MCH: 30 pg (ref 26.0–34.0)
MCH: 29.3 pg (ref 26.0–34.0)
MCHC: 33.2 g/dL (ref 30.0–36.0)
MCHC: 32.6 g/dL (ref 30.0–36.0)
MCV: 90.3 fL (ref 80.0–100.0)
MCV: 89.9 fL (ref 80.0–100.0)
Monocytes Absolute: 0.4 10*3/uL (ref 0.1–1.0)
Monocytes Absolute: 0.6 10*3/uL (ref 0.1–1.0)
Monocytes Relative: 8 %
Monocytes Relative: 13 %
Neutro Abs: 2.7 10*3/uL (ref 1.7–7.7)
Neutro Abs: 3.2 10*3/uL (ref 1.7–7.7)
Neutrophils Relative %: 64 %
Neutrophils Relative %: 72 %
Platelets: 66 10*3/uL — ABNORMAL LOW (ref 150–400)
Platelets: 88 10*3/uL — ABNORMAL LOW (ref 150–400)
RBC: 3.9 MIL/uL (ref 3.87–5.11)
RBC: 3.65 MIL/uL — ABNORMAL LOW (ref 3.87–5.11)
RDW: 13.2 % (ref 11.5–15.5)
RDW: 13.2 % (ref 11.5–15.5)
WBC: 4.3 10*3/uL (ref 4.0–10.5)
WBC: 4.5 10*3/uL (ref 4.0–10.5)
nRBC: 0 % (ref 0.0–0.2)
nRBC: 0 % (ref 0.0–0.2)

## 2021-05-30 LAB — CULTURE, BLOOD (ROUTINE X 2): Special Requests: ADEQUATE

## 2021-05-30 LAB — BASIC METABOLIC PANEL
Anion gap: 7 (ref 5–15)
BUN: 37 mg/dL — ABNORMAL HIGH (ref 6–20)
CO2: 24 mmol/L (ref 22–32)
Calcium: 9.4 mg/dL (ref 8.9–10.3)
Chloride: 98 mmol/L (ref 98–111)
Creatinine, Ser: 1.36 mg/dL — ABNORMAL HIGH (ref 0.44–1.00)
GFR, Estimated: 47 mL/min — ABNORMAL LOW (ref >60)
Glucose, Bld: 163 mg/dL — ABNORMAL HIGH (ref 70–99)
Potassium: 5.1 mmol/L (ref 3.5–5.1)
Sodium: 129 mmol/L — ABNORMAL LOW (ref 135–145)

## 2021-05-30 LAB — URINE CULTURE: Culture: >=100000 — AB

## 2021-05-30 LAB — GLUCOSE, CAPILLARY
Glucose-Capillary: 153 mg/dL — ABNORMAL HIGH (ref 70–99)
Glucose-Capillary: 154 mg/dL — ABNORMAL HIGH (ref 70–99)
Glucose-Capillary: 257 mg/dL — ABNORMAL HIGH (ref 70–99)
Glucose-Capillary: 163 mg/dL — ABNORMAL HIGH (ref 70–99)

## 2021-05-30 LAB — MAGNESIUM: Magnesium: 1.8 mg/dL (ref 1.7–2.4)

## 2021-05-30 LAB — PHOSPHORUS: Phosphorus: 2.1 mg/dL — ABNORMAL LOW (ref 2.5–4.6)

## 2021-05-30 MED ORDER — GABAPENTIN 600 MG PO TABS: 600.0000 mg | ORAL_TABLET | Freq: Every day | ORAL | Status: DC

## 2021-05-30 MED ORDER — OXYCODONE HCL 5 MG PO TABS: 5.0000 mg | ORAL_TABLET | Freq: Four times a day (QID) | ORAL | Status: DC | PRN

## 2021-05-30 MED ORDER — LOPERAMIDE HCL 2 MG PO CAPS: 2.0000 mg | ORAL_CAPSULE | Freq: Four times a day (QID) | ORAL | Status: DC | PRN

## 2021-05-30 MED ORDER — SODIUM CHLORIDE 0.9 % IV SOLN
1.0000 g | Freq: Three times a day (TID) | INTRAVENOUS | Status: DC
  Administered 2021-05-30 – 2021-06-03 (×13): 1 g via INTRAVENOUS
  Filled 2021-05-30 (×16): qty 1

## 2021-05-30 MED ORDER — CARVEDILOL 12.5 MG PO TABS
12.5000 mg | ORAL_TABLET | Freq: Two times a day (BID) | ORAL | Status: DC
  Administered 2021-05-30 – 2021-06-03 (×9): 12.5 mg via ORAL
  Filled 2021-05-30 (×9): qty 1

## 2021-05-30 MED ORDER — ROSUVASTATIN CALCIUM 5 MG PO TABS
5.0000 mg | ORAL_TABLET | Freq: Every day | ORAL | Status: DC
  Administered 2021-05-30 – 2021-06-02 (×4): 5 mg via ORAL
  Filled 2021-05-30 (×4): qty 1

## 2021-05-30 MED ORDER — PROCHLORPERAZINE MALEATE 10 MG PO TABS: 10.0000 mg | ORAL_TABLET | Freq: Four times a day (QID) | ORAL | Status: DC | PRN

## 2021-05-30 NOTE — Progress Notes (Signed)
[facility] Infectious Diseases Follow Up Note  Patient Identification: Patient Name: Cynthia [name] MRN: [id number] [place] Date: 05/27/2021 10:27 PM Age: 50 y.o.Today's Date: 05/30/2021   Reason for Visit: Pyelonephritis  Principal Problem:   Acute UTI Active Problems:   Diabetes mellitus type I ([place])   History of renal transplant   HTN (hypertension)   Malignant neoplasm of lower-inner quadrant of left breast in female, estrogen receptor positive (HCC)   Stage 3a chronic kidney disease (CKD) (HCC)   Thrombocytopenia (HCC)  Antibiotics: Ceftriaxone 12/3                    Meropenem 12/4   Lines/Hardware: Left arm AV fistula   Interval Events: Continues to be febrile with T-max 103, sweating, body pain and cramps.  WBC 4.3.  Platelets improved to 66   Assessment # Fevers: High-grade fever, T-max 103 with intense sweating.  Complains of body ache and bilateral leg pain.  1 episode of vomiting earlier in the day.  No respiratory symptoms/rashes or Joint complaints. No phlebitis. She had similar reaction when she got her first dose of chemotherapy.  WBC and treated count are improving.   # E. coli bacteremia, ESBL secondary to complicated UTI - US kidneys unremarkable for acute changes   # Breast cancer status post lumpectomy and undergoing active chemotherapy # Pancytopenia in the setting of recent chemotherapy # DD KT in 2017 on immunosuppression(tacrolimus, prednisone and mycophenolate) with CKD # DM, uncontrolled-last A1c 11.9   Recommendations Continue meropenem as is 2 sets of repeat blood cultures  Monitor fever curve, WBC count  Following  Rest of the management as per the primary team. Thank you for the consult. Please page with pertinent questions or concerns.  ______________________________________________________________________ Subjective patient seen and examined at the bedside.  High-grade fever, body  ache and leg pain.  1 episode of vomiting earlier this a.m.  Vitals BP (!) 124/48 (BP Location: Right Arm)   Pulse 83   Temp 97.9 F (36.6 C) (Oral)   Resp 18   Ht 5\' 1"  (1.549 m)   Wt 95.2 kg   SpO2 94%   BMI 39.66 kg/m     Physical Exam Constitutional: Lying in bed, has sweating    Comments:   Cardiovascular:     Rate and Rhythm: Normal rate and regular rhythm.     Heart sounds:   Pulmonary:     Effort: Pulmonary effort is normal.     Comments:   Abdominal:     Palpations: Abdomen is soft.     Tenderness: Nontender  Musculoskeletal:        General: No swelling or tenderness.   Skin:    Comments: No lesions or rashes.  No phlebitis on skin exam  Neurological:     General: No focal deficit present.  Awake alert and oriented  Psychiatric:        Mood and Affect: Mood normal.  Calm and cooperative  Pertinent Microbiology Results for orders placed or performed during the hospital encounter of 05/27/21  Resp Panel by RT-PCR (Flu A&B, Covid) Nasopharyngeal Swab     Status: None   Collection Time: 05/27/21 11:52 PM   Specimen: Nasopharyngeal Swab; Nasopharyngeal(NP) swabs in vial transport medium  Result Value Ref Range Status   SARS Coronavirus 2 by RT PCR NEGATIVE NEGATIVE Final    Comment: (NOTE) SARS-CoV-2 target nucleic acids are NOT DETECTED.  The SARS-CoV-2 RNA is generally detectable in upper respiratory specimens during the acute  phase of infection. The lowest concentration of SARS-CoV-2 viral copies this assay can detect is 138 copies/mL. A negative result does not preclude SARS-Cov-2 infection and should not be used as the sole basis for treatment or other patient management decisions. A negative result may occur with  improper specimen collection/handling, submission of specimen other than nasopharyngeal swab, presence of viral mutation(s) within the areas targeted by this assay, and inadequate number of viral copies(<138 copies/mL). A negative  result must be combined with clinical observations, patient history, and epidemiological information. The expected result is Negative.  Fact Sheet for Patients:  EntrepreneurPulse.com.au  Fact Sheet for Healthcare Providers:  IncredibleEmployment.be  This test is no t yet approved or cleared by the [place] FDA and  has been authorized for detection and/or diagnosis of SARS-CoV-2 by FDA under an Emergency Use Authorization (EUA). This EUA will remain  in effect (meaning this test can be used) for the duration of the COVID-19 declaration under Section 564(b)(1) of the Act, 21 U.S.C.section 360bbb-3(b)(1), unless the authorization is terminated  or revoked sooner.       Influenza A by PCR NEGATIVE NEGATIVE Final   Influenza B by PCR NEGATIVE NEGATIVE Final    Comment: (NOTE) The Xpert Xpress SARS-CoV-2/FLU/RSV plus assay is intended as an aid in the diagnosis of influenza from Nasopharyngeal swab specimens and should not be used as a sole basis for treatment. Nasal washings and aspirates are unacceptable for Xpert Xpress SARS-CoV-2/FLU/RSV testing.  Fact Sheet for Patients: EntrepreneurPulse.com.au  Fact Sheet for Healthcare Providers: IncredibleEmployment.be  This test is not yet approved or cleared by the [place] FDA and has been authorized for detection and/or diagnosis of SARS-CoV-2 by FDA under an Emergency Use Authorization (EUA). This EUA will remain in effect (meaning this test can be used) for the duration of the COVID-19 declaration under Section 564(b)(1) of the Act, 21 U.S.C. section 360bbb-3(b)(1), unless the authorization is terminated or revoked.  Performed at [facility], [place] [address] [place], [place] [address]   Urine Culture     Status: Abnormal   Collection Time: 05/28/21 12:17 AM   Specimen: Urine, Clean Catch  Result Value Ref Range Status    Specimen Description   Final    URINE, CLEAN CATCH Performed at [facility], [place] [address] [place], [place] [address]    Special Requests   Final    NONE Performed at [facility], [place] [address] [place],  [address]    Culture (A)  Final    >=100,000 COLONIES/mL ESCHERICHIA COLI Confirmed Extended Spectrum Beta-Lactamase Producer (ESBL).  In bloodstream infections from ESBL organisms, carbapenems are preferred over piperacillin/tazobactam. They are shown to have a lower risk of mortality.    Report Status 05/30/2021 FINAL  Final   Organism ID, Bacteria ESCHERICHIA COLI (A)  Final      Susceptibility   Escherichia coli - MIC*    AMPICILLIN >=32 RESISTANT Resistant     CEFAZOLIN >=64 RESISTANT Resistant     CEFEPIME 16 RESISTANT Resistant     CEFTRIAXONE >=64 RESISTANT Resistant     CIPROFLOXACIN >=4 RESISTANT Resistant     GENTAMICIN >=16 RESISTANT Resistant     IMIPENEM <=0.25 SENSITIVE Sensitive     NITROFURANTOIN <=16 SENSITIVE Sensitive     TRIMETH/SULFA <=20 SENSITIVE Sensitive     AMPICILLIN/SULBACTAM 4 SENSITIVE Sensitive     PIP/TAZO <=4 SENSITIVE Sensitive     * >=100,000 COLONIES/mL ESCHERICHIA COLI  Culture, blood (  routine x 2)     Status: Abnormal   Collection Time: 05/28/21 12:23 AM   Specimen: BLOOD  Result Value Ref Range Status   Specimen Description   Final    BLOOD RIGHT ANTECUBITAL Performed at [facility] [address] [place], [place] [address]    Special Requests   Final    BOTTLES DRAWN AEROBIC AND ANAEROBIC Blood Culture results may not be optimal due to an excessive volume of blood received in culture bottles Performed at [facility] [address] [place], [place] [address]    Culture  Setup Time   Final    GRAM NEGATIVE RODS IN BOTH AEROBIC AND ANAEROBIC BOTTLES CRITICAL RESULT CALLED TO, READ BACK BY AND VERIFIED WITH: PHARM D J.LEGGE ON [id number] AT 1804  BY E.PARRISH Performed at [facility], [place] [address], [place], [place] [address]    Culture (A)  Final    ESCHERICHIA COLI Confirmed Extended Spectrum Beta-Lactamase Producer (ESBL).  In bloodstream infections from ESBL organisms, carbapenems are preferred over piperacillin/tazobactam. They are shown to have a lower risk of mortality.    Report Status 05/30/2021 FINAL  Final   Organism ID, Bacteria ESCHERICHIA COLI  Final      Susceptibility   Escherichia coli - MIC*    AMPICILLIN >=32 RESISTANT Resistant     CEFAZOLIN >=64 RESISTANT Resistant     CEFEPIME 16 RESISTANT Resistant     CEFTAZIDIME RESISTANT Resistant     CEFTRIAXONE >=64 RESISTANT Resistant     CIPROFLOXACIN >=4 RESISTANT Resistant     GENTAMICIN >=16 RESISTANT Resistant     IMIPENEM <=0.25 SENSITIVE Sensitive     TRIMETH/SULFA <=20 SENSITIVE Sensitive     AMPICILLIN/SULBACTAM 8 SENSITIVE Sensitive     PIP/TAZO <=4 SENSITIVE Sensitive     * ESCHERICHIA COLI  Blood Culture ID Panel (Reflexed)     Status: Abnormal   Collection Time: 05/28/21 12:23 AM  Result Value Ref Range Status   Enterococcus faecalis NOT DETECTED NOT DETECTED Final   Enterococcus Faecium NOT DETECTED NOT DETECTED Final   Listeria monocytogenes NOT DETECTED NOT DETECTED Final   Staphylococcus species NOT DETECTED NOT DETECTED Final   Staphylococcus aureus (BCID) NOT DETECTED NOT DETECTED Final   Staphylococcus epidermidis NOT DETECTED NOT DETECTED Final   Staphylococcus lugdunensis NOT DETECTED NOT DETECTED Final   Streptococcus species NOT DETECTED NOT DETECTED Final   Streptococcus agalactiae NOT DETECTED NOT DETECTED Final   Streptococcus pneumoniae NOT DETECTED NOT DETECTED Final   Streptococcus pyogenes NOT DETECTED NOT DETECTED Final   A.calcoaceticus-baumannii NOT DETECTED NOT DETECTED Final   Bacteroides fragilis NOT DETECTED NOT DETECTED Final   Enterobacterales DETECTED (A) NOT DETECTED Final    Comment: Enterobacterales  represent a large order of gram negative bacteria, not a single organism. CRITICAL RESULT CALLED TO, READ BACK BY AND VERIFIED WITH: PHARM D J.LEGGE ON [id number] AT 1804 BY E.PARRISH    Enterobacter cloacae complex NOT DETECTED NOT DETECTED Final   Escherichia coli DETECTED (A) NOT DETECTED Final    Comment: CRITICAL RESULT CALLED TO, READ BACK BY AND VERIFIED WITH: PHARM D J.LEGGE ON [id number] AT 1804 BY E.PARRISH    Klebsiella aerogenes NOT DETECTED NOT DETECTED Final   Klebsiella oxytoca NOT DETECTED NOT DETECTED Final   Klebsiella pneumoniae NOT DETECTED NOT DETECTED Final   Proteus species NOT DETECTED NOT DETECTED Final   Salmonella species NOT DETECTED NOT DETECTED Final   Serratia marcescens NOT DETECTED NOT  DETECTED Final   Haemophilus influenzae NOT DETECTED NOT DETECTED Final   Neisseria meningitidis NOT DETECTED NOT DETECTED Final   Pseudomonas aeruginosa NOT DETECTED NOT DETECTED Final   Stenotrophomonas maltophilia NOT DETECTED NOT DETECTED Final   Candida albicans NOT DETECTED NOT DETECTED Final   Candida auris NOT DETECTED NOT DETECTED Final   Candida glabrata NOT DETECTED NOT DETECTED Final   Candida krusei NOT DETECTED NOT DETECTED Final   Candida parapsilosis NOT DETECTED NOT DETECTED Final   Candida tropicalis NOT DETECTED NOT DETECTED Final   Cryptococcus neoformans/gattii NOT DETECTED NOT DETECTED Final   CTX-M ESBL DETECTED (A) NOT DETECTED Final    Comment: CRITICAL RESULT CALLED TO, READ BACK BY AND VERIFIED WITH: PHARM D J.LEGGE ON [id number] AT 1804 BY E.PARRISH (NOTE) Extended spectrum beta-lactamase detected. Recommend a carbapenem as initial therapy.      Carbapenem resistance IMP NOT DETECTED NOT DETECTED Final   Carbapenem resistance KPC NOT DETECTED NOT DETECTED Final   Carbapenem resistance NDM NOT DETECTED NOT DETECTED Final   Carbapenem resist OXA 48 LIKE NOT DETECTED NOT DETECTED Final   Carbapenem resistance VIM NOT DETECTED NOT DETECTED Final     Comment: Performed at [facility], [place] [address], [place], [place] [address]  Culture, blood (routine x 2)     Status: Abnormal   Collection Time: 05/28/21 12:28 AM   Specimen: BLOOD  Result Value Ref Range Status   Specimen Description   Final    BLOOD BLOOD RIGHT HAND Performed at [facility] [address] [place], [place] [address]    Special Requests   Final    BOTTLES DRAWN AEROBIC AND ANAEROBIC Blood Culture adequate volume Performed at [facility] [address], [place], [place] [address]    Culture  Setup Time   Final    GRAM NEGATIVE RODS IN BOTH AEROBIC AND ANAEROBIC BOTTLES CRITICAL VALUE NOTED.  VALUE IS CONSISTENT WITH PREVIOUSLY REPORTED AND CALLED VALUE.    Culture (A)  Final    ESCHERICHIA COLI SUSCEPTIBILITIES PERFORMED ON PREVIOUS CULTURE WITHIN THE LAST 5 DAYS. Performed at [facility], [place] [address] [place], [place] [address]    Report Status 05/30/2021 FINAL  Final     Pertinent Lab. CBC Latest Ref Rng & Units 05/30/2021 05/29/2021 05/27/2021  WBC 4.0 - 10.5 K/uL 4.3 3.9(L) 9.0  Hemoglobin 12.0 - 15.0 g/dL 11.7(L) 10.4(L) 11.8(L)  Hematocrit 36.0 - 46.0 % 35.2(L) 31.7(L) 35.4(L)  Platelets 150 - 400 K/uL 66(L) 47(L) 92(L)   CMP Latest Ref Rng & Units 05/30/2021 05/29/2021 05/27/2021  Glucose 70 - 99 mg/dL 163(H) 228(H) 326(H)  BUN 6 - 20 mg/dL 37(H) 44(H) 36(H)  Creatinine 0.44 - 1.00 mg/dL 1.36(H) 1.61(H) 1.21(H)  Sodium 135 - 145 mmol/L 129(L) 126(L) 128(L)  Potassium 3.5 - 5.1 mmol/L 5.1 5.0 4.9  Chloride 98 - 111 mmol/L 98 98 99  CO2 22 - 32 mmol/L 24 25 22   Calcium 8.9 - 10.3 mg/dL 9.4 8.5(L) 9.2  Total Protein 6.5 - 8.1 g/dL - - 6.0(L)  Total Bilirubin 0.3 - 1.2 mg/dL - - 0.7  Alkaline Phos 38 - 126 U/L - - 114  AST 15 - 41 U/L - - 14(L)  ALT 0 - 44 U/L - - 26     Pertinent Imaging today Plain films and CT images have been personally visualized and interpreted; radiology reports  have been reviewed. Decision making incorporated into the Impression / Recommendations. US  renal 12/6  FINDINGS: Right Kidney:   Renal measurements: 8.4 x 4.4 x 3.8 cm = volume: 73 mL. Difficult to visualize. Increased renal echogenicity. No hydronephrosis.   Left Kidney:   Not visualized.   Bladder:   Appears normal for degree of bladder distention.   Other:   Transplanted kidney not evaluated.   IMPRESSION: 1. Native right kidney is small and echogenic likely related to medical renal disease. 2. Native left kidney not visualized.  I spent more than 40 minutes for this patient encounter including review of prior medical records, coordination of care  with greater than 50% of time being face to face/counseling and discussing diagnostics/treatment plan with the patient/family.  Electronically signed by:   Rosiland Oz, MD Infectious Disease Physician [facility] Pager: [phone]

## 2021-05-30 NOTE — Progress Notes (Addendum)
PROGRESS NOTE  Cynthia [name] IOX:[id number] DOB: [birth year] DOA: 05/27/2021 PCP: Arthur Holms, NP   LOS: 2 days   Brief narrative: Cynthia [name] is a 50 y.o. female with past medical history of renal transplant, CKD stage IIIa, diabetes mellitus on insulin, breast cancer currently on chemotherapy presented to the hospital with 2 to 3 days history of dysuria and urinary urgency and frequency followed by fever and chills.  Patient was recently treated with docetaxel and cyclophosphamide on 05/24/2021 and subsequently developed urinary symptoms.  She then received  Udenyca on 05/26/21 with fever of 100.6 F.  In the ED, patient was afebrile but tachycardic.  EKG and chest x-ray was unremarkable except for creatinine of 1.2.  Lactic acid was normal.  COVID and influenza was negative.  Blood culture and urine culture was drawn from the ED and patient was given 500 mL of LR and started on IV Rocephin.  Patient was then admitted to the hospital for UTI.  Assessment/Plan:  Principal Problem:   Acute UTI Active Problems:   Diabetes mellitus type I ([place])   History of renal transplant   HTN (hypertension)   Malignant neoplasm of lower-inner quadrant of left breast in female, estrogen receptor positive (HCC)   Stage 3a chronic kidney disease (CKD) (HCC)   Thrombocytopenia (HCC)  ESBL E. coli bacteremia likely secondary to acute cystitis. On meropenem.  ID has been consulted for complicated UTI with renal transplant and ESBL UTI.  Patient is immune suppressed due to being on chemotherapy as outpatient and on immune suppressant.  Renal ultrasound did not show any hydronephrosis.  We will follow ID recommendations.  Hx of renal transplant; CKD IIIa  Deceased donor kidney recipient in 2017.  Serum creatinine was 1.2 on admission  On tacrolimus mycophenolate and prednisone at home.  Creatinine of 1.3 today.  Spoke with Dr. Joelyn Oms nephrology yesterday who did not recommend adjusting dose of tacrolimus at  this time.  Patient follows up with [facility] as outpatient.  Breast cancer status postlumpectomy and chemotherapy  received docetaxel and cyclophosphamide on 05/24/21 and Udenyca on 05/26/21 . Patient follows up with oncology as outpatient.    Insulin-dependent DM  Patient has hemoglobin A1c of 11.6 in May 2022.  Continue sliding scale insulin Accu-Cheks, long-acting insulin diabetic diet.  Latest POC glucose of 153.  Patient reported that she was taking Lantus 40 units twice daily at home.    Thrombocytopenia  Platelet count of 66 today. No evidence of bleeding at this time.  Recent use of chemotherapy.   Mild hyponatremia. We will continue to monitor.  Asymptomatic.  Was on Lexapro at home.  Currently on hold.  Hypertension.  Accelerated at this time.  Patient is continuing to have pain.  We will focus on pain management.  Patient takes Coreg at home.  Will restart home medication regimen.  Generalized body pain aches or spasm.  Neurontin at home.  Will resume.  Hold off with NSAIDs for now.  DVT prophylaxis: SCDs Start: 05/28/21 2683   Code Status: Full code.  Family Communication:  Spoke with the patient's 2 daughters at bedside.  Status is: Inpatient  Remains inpatient appropriate because: Need for IV antibiotics, further monitoring, ID follow-up,    Consultants: ID   Procedures: None  Anti-infectives:  Meropenem IV  Anti-infectives (From admission, onward)    Start     Dose/Rate Route Frequency Ordered Stop   05/29/21 1600  meropenem (MERREM) 1 g in sodium chloride 0.9 % 100 mL  IVPB        1 g 200 mL/hr over 30 Minutes Intravenous Every 12 hours 05/29/21 0802     05/28/21 2000  meropenem (MERREM) 1 g in sodium chloride 0.9 % 100 mL IVPB  Status:  Discontinued        1 g 200 mL/hr over 30 Minutes Intravenous Every 8 hours 05/28/21 1819 05/29/21 0802   05/28/21 0115  cefTRIAXone (ROCEPHIN) 2 g in sodium chloride 0.9 % 100 mL IVPB  Status:   Discontinued        2 g 200 mL/hr over 30 Minutes Intravenous Every 24 hours 05/28/21 0100 05/28/21 1817      Subjective: Today, patient was seen and examined at bedside.  Complains of generalized body pain leg cramps chills.  Had 1 episode of vomiting today.  Temperature max of 103 F.  Objective: Vitals:   05/30/21 0842 05/30/21 1001  BP:  (!) 190/57  Pulse:  (!) 108  Resp:  19  Temp: 99.5 F (37.5 C) (!) 102 F (38.9 C)  SpO2:  95%    Intake/Output Summary (Last 24 hours) at 05/30/2021 1110 Last data filed at 05/30/2021 0959 Gross per 24 hour  Intake 411.44 ml  Output 4 ml  Net 407.44 ml    Filed Weights   05/27/21 2233 05/29/21 0500 05/30/21 0520  Weight: 93 kg 94.1 kg 95.2 kg   Body mass index is 39.66 kg/m.   Physical Exam:  GENERAL: Patient is alert awake and oriented. Not in obvious distress.  Obese, on nasal cannula oxygen HENT: No scleral pallor or icterus. Pupils equally reactive to light. Oral mucosa is moist NECK: is supple, no gross swelling noted. CHEST: Clear to auscultation. No crackles or wheezes.  Diminished breath sounds bilaterally. CVS: S1 and S2 heard, no murmur. Regular rate and rhythm.  ABDOMEN: Soft, suprapubic tenderness on palpation.  Bowel sounds are present.  Bilateral costovertebral angle tenderness on palpation. EXTREMITIES: Trace lower extremity edema noted.  Tenderness on palpation of the extremities. CNS: Cranial nerves are intact. No focal motor deficits. SKIN: warm and dry without rashes.  Data Review: I have personally reviewed the following laboratory data and studies,  CBC: Recent Labs  Lab 05/24/21 1020 05/27/21 2259 05/29/21 0509 05/30/21 0536  WBC 8.4 9.0 3.9* 4.3  NEUTROABS 6.2 7.0  --  2.7  HGB 11.4* 11.8* 10.4* 11.7*  HCT 34.7* 35.4* 31.7* 35.2*  MCV 89.7 90.3 91.1 90.3  PLT 185 92* 47* 66*    Basic Metabolic Panel: Recent Labs  Lab 05/24/21 1020 05/27/21 2259 05/29/21 0509 05/30/21 0536  NA 132* 128*  126* 129*  K 4.8 4.9 5.0 5.1  CL 101 99 98 98  CO2 24 22 25 24   GLUCOSE 285* 326* 228* 163*  BUN 20 36* 44* 37*  CREATININE 1.16* 1.21* 1.61* 1.36*  CALCIUM 9.3 9.2 8.5* 9.4  MG  --   --   --  1.8  PHOS  --   --   --  2.1*    Liver Function Tests: Recent Labs  Lab 05/24/21 1020 05/27/21 2259  AST 21 14*  ALT 37 26  ALKPHOS 99 114  BILITOT 0.5 0.7  PROT 5.7* 6.0*  ALBUMIN 3.4* 3.5    No results for input(s): LIPASE, AMYLASE in the last 168 hours. No results for input(s): AMMONIA in the last 168 hours. Cardiac Enzymes: No results for input(s): CKTOTAL, CKMB, CKMBINDEX, TROPONINI in the last 168 hours. BNP (last 3 results) No results  for input(s): BNP in the last 8760 hours.  ProBNP (last 3 results) No results for input(s): PROBNP in the last 8760 hours.  CBG: Recent Labs  Lab 05/29/21 0816 05/29/21 1211 05/29/21 1725 05/29/21 2150 05/30/21 0747  GLUCAP 242* 262* 271* 211* 153*    Recent Results (from the past 240 hour(s))  Resp Panel by RT-PCR (Flu A&B, Covid) Nasopharyngeal Swab     Status: None   Collection Time: 05/27/21 11:52 PM   Specimen: Nasopharyngeal Swab; Nasopharyngeal(NP) swabs in vial transport medium  Result Value Ref Range Status   SARS Coronavirus 2 by RT PCR NEGATIVE NEGATIVE Final    Comment: (NOTE) SARS-CoV-2 target nucleic acids are NOT DETECTED.  The SARS-CoV-2 RNA is generally detectable in upper respiratory specimens during the acute phase of infection. The lowest concentration of SARS-CoV-2 viral copies this assay can detect is 138 copies/mL. A negative result does not preclude SARS-Cov-2 infection and should not be used as the sole basis for treatment or other patient management decisions. A negative result may occur with  improper specimen collection/handling, submission of specimen other than nasopharyngeal swab, presence of viral mutation(s) within the areas targeted by this assay, and inadequate number of viral copies(<138  copies/mL). A negative result must be combined with clinical observations, patient history, and epidemiological information. The expected result is Negative.  Fact Sheet for Patients:  EntrepreneurPulse.com.au  Fact Sheet for Healthcare Providers:  IncredibleEmployment.be  This test is no t yet approved or cleared by the [place] FDA and  has been authorized for detection and/or diagnosis of SARS-CoV-2 by FDA under an Emergency Use Authorization (EUA). This EUA will remain  in effect (meaning this test can be used) for the duration of the COVID-19 declaration under Section 564(b)(1) of the Act, 21 U.S.C.section 360bbb-3(b)(1), unless the authorization is terminated  or revoked sooner.       Influenza A by PCR NEGATIVE NEGATIVE Final   Influenza B by PCR NEGATIVE NEGATIVE Final    Comment: (NOTE) The Xpert Xpress SARS-CoV-2/FLU/RSV plus assay is intended as an aid in the diagnosis of influenza from Nasopharyngeal swab specimens and should not be used as a sole basis for treatment. Nasal washings and aspirates are unacceptable for Xpert Xpress SARS-CoV-2/FLU/RSV testing.  Fact Sheet for Patients: EntrepreneurPulse.com.au  Fact Sheet for Healthcare Providers: IncredibleEmployment.be  This test is not yet approved or cleared by the [place] FDA and has been authorized for detection and/or diagnosis of SARS-CoV-2 by FDA under an Emergency Use Authorization (EUA). This EUA will remain in effect (meaning this test can be used) for the duration of the COVID-19 declaration under Section 564(b)(1) of the Act, 21 U.S.C. section 360bbb-3(b)(1), unless the authorization is terminated or revoked.  Performed at [facility], [place] [address], [place], [place] [address]   Urine Culture     Status: Abnormal   Collection Time: 05/28/21 12:17 AM   Specimen: Urine, Clean Catch  Result Value  Ref Range Status   Specimen Description   Final    URINE, CLEAN CATCH Performed at [facility], [place] [address] [place], [place] [address]    Special Requests   Final    NONE Performed at [facility], [place] [address] [place], [place] [address]    Culture (A)  Final    >=100,000 COLONIES/mL ESCHERICHIA COLI Confirmed Extended Spectrum Beta-Lactamase Producer (ESBL).  In bloodstream infections from ESBL organisms, carbapenems are preferred over piperacillin/tazobactam. They are shown to have a lower  risk of mortality.    Report Status 05/30/2021 FINAL  Final   Organism ID, Bacteria ESCHERICHIA COLI (A)  Final      Susceptibility   Escherichia coli - MIC*    AMPICILLIN >=32 RESISTANT Resistant     CEFAZOLIN >=64 RESISTANT Resistant     CEFEPIME 16 RESISTANT Resistant     CEFTRIAXONE >=64 RESISTANT Resistant     CIPROFLOXACIN >=4 RESISTANT Resistant     GENTAMICIN >=16 RESISTANT Resistant     IMIPENEM <=0.25 SENSITIVE Sensitive     NITROFURANTOIN <=16 SENSITIVE Sensitive     TRIMETH/SULFA <=20 SENSITIVE Sensitive     AMPICILLIN/SULBACTAM 4 SENSITIVE Sensitive     PIP/TAZO <=4 SENSITIVE Sensitive     * >=100,000 COLONIES/mL ESCHERICHIA COLI  Culture, blood (routine x 2)     Status: Abnormal   Collection Time: 05/28/21 12:23 AM   Specimen: BLOOD  Result Value Ref Range Status   Specimen Description   Final    BLOOD RIGHT ANTECUBITAL Performed at [place] [address] [place], [place] [address]    Special Requests   Final    BOTTLES DRAWN AEROBIC AND ANAEROBIC Blood Culture results may not be optimal due to an excessive volume of blood received in culture bottles Performed at [place] [address] [place], [place] [address]    Culture  Setup Time   Final    GRAM NEGATIVE RODS IN BOTH AEROBIC AND ANAEROBIC BOTTLES CRITICAL RESULT CALLED TO, READ BACK BY AND VERIFIED WITH: PHARM D J.LEGGE  ON [id number] AT 1804 BY E.PARRISH Performed at [facility], [place] [address] [place], [place] [address]    Culture (A)  Final    ESCHERICHIA COLI Confirmed Extended Spectrum Beta-Lactamase Producer (ESBL).  In bloodstream infections from ESBL organisms, carbapenems are preferred over piperacillin/tazobactam. They are shown to have a lower risk of mortality.    Report Status 05/30/2021 FINAL  Final   Organism ID, Bacteria ESCHERICHIA COLI  Final      Susceptibility   Escherichia coli - MIC*    AMPICILLIN >=32 RESISTANT Resistant     CEFAZOLIN >=64 RESISTANT Resistant     CEFEPIME 16 RESISTANT Resistant     CEFTAZIDIME RESISTANT Resistant     CEFTRIAXONE >=64 RESISTANT Resistant     CIPROFLOXACIN >=4 RESISTANT Resistant     GENTAMICIN >=16 RESISTANT Resistant     IMIPENEM <=0.25 SENSITIVE Sensitive     TRIMETH/SULFA <=20 SENSITIVE Sensitive     AMPICILLIN/SULBACTAM 8 SENSITIVE Sensitive     PIP/TAZO <=4 SENSITIVE Sensitive     * ESCHERICHIA COLI  Blood Culture ID Panel (Reflexed)     Status: Abnormal   Collection Time: 05/28/21 12:23 AM  Result Value Ref Range Status   Enterococcus faecalis NOT DETECTED NOT DETECTED Final   Enterococcus Faecium NOT DETECTED NOT DETECTED Final   Listeria monocytogenes NOT DETECTED NOT DETECTED Final   Staphylococcus species NOT DETECTED NOT DETECTED Final   Staphylococcus aureus (BCID) NOT DETECTED NOT DETECTED Final   Staphylococcus epidermidis NOT DETECTED NOT DETECTED Final   Staphylococcus lugdunensis NOT DETECTED NOT DETECTED Final   Streptococcus species NOT DETECTED NOT DETECTED Final   Streptococcus agalactiae NOT DETECTED NOT DETECTED Final   Streptococcus pneumoniae NOT DETECTED NOT DETECTED Final   Streptococcus pyogenes NOT DETECTED NOT DETECTED Final   A.calcoaceticus-baumannii NOT DETECTED NOT DETECTED Final   Bacteroides fragilis NOT DETECTED NOT DETECTED Final   Enterobacterales DETECTED (A) NOT DETECTED Final  Comment:  Enterobacterales represent a large order of gram negative bacteria, not a single organism. CRITICAL RESULT CALLED TO, READ BACK BY AND VERIFIED WITH: PHARM D J.LEGGE ON [id number] AT 1804 BY E.PARRISH    Enterobacter cloacae complex NOT DETECTED NOT DETECTED Final   Escherichia coli DETECTED (A) NOT DETECTED Final    Comment: CRITICAL RESULT CALLED TO, READ BACK BY AND VERIFIED WITH: PHARM D J.LEGGE ON [id number] AT 1804 BY E.PARRISH    Klebsiella aerogenes NOT DETECTED NOT DETECTED Final   Klebsiella oxytoca NOT DETECTED NOT DETECTED Final   Klebsiella pneumoniae NOT DETECTED NOT DETECTED Final   Proteus species NOT DETECTED NOT DETECTED Final   Salmonella species NOT DETECTED NOT DETECTED Final   Serratia marcescens NOT DETECTED NOT DETECTED Final   Haemophilus influenzae NOT DETECTED NOT DETECTED Final   Neisseria meningitidis NOT DETECTED NOT DETECTED Final   Pseudomonas aeruginosa NOT DETECTED NOT DETECTED Final   Stenotrophomonas maltophilia NOT DETECTED NOT DETECTED Final   Candida albicans NOT DETECTED NOT DETECTED Final   Candida auris NOT DETECTED NOT DETECTED Final   Candida glabrata NOT DETECTED NOT DETECTED Final   Candida krusei NOT DETECTED NOT DETECTED Final   Candida parapsilosis NOT DETECTED NOT DETECTED Final   Candida tropicalis NOT DETECTED NOT DETECTED Final   Cryptococcus neoformans/gattii NOT DETECTED NOT DETECTED Final   CTX-M ESBL DETECTED (A) NOT DETECTED Final    Comment: CRITICAL RESULT CALLED TO, READ BACK BY AND VERIFIED WITH: PHARM D J.LEGGE ON [id number] AT 1804 BY E.PARRISH (NOTE) Extended spectrum beta-lactamase detected. Recommend a carbapenem as initial therapy.      Carbapenem resistance IMP NOT DETECTED NOT DETECTED Final   Carbapenem resistance KPC NOT DETECTED NOT DETECTED Final   Carbapenem resistance NDM NOT DETECTED NOT DETECTED Final   Carbapenem resist OXA 48 LIKE NOT DETECTED NOT DETECTED Final   Carbapenem resistance VIM NOT DETECTED  NOT DETECTED Final    Comment: Performed at [facility], [place] [address] [place], [place] [address]  Culture, blood (routine x 2)     Status: Abnormal   Collection Time: 05/28/21 12:28 AM   Specimen: BLOOD  Result Value Ref Range Status   Specimen Description   Final    BLOOD BLOOD RIGHT HAND Performed at [place] [address], [place], [place] [address]    Special Requests   Final    BOTTLES DRAWN AEROBIC AND ANAEROBIC Blood Culture adequate volume Performed at [place] [address] [place], [place] [address]    Culture  Setup Time   Final    GRAM NEGATIVE RODS IN BOTH AEROBIC AND ANAEROBIC BOTTLES CRITICAL VALUE NOTED.  VALUE IS CONSISTENT WITH PREVIOUSLY REPORTED AND CALLED VALUE.    Culture (A)  Final    ESCHERICHIA COLI SUSCEPTIBILITIES PERFORMED ON PREVIOUS CULTURE WITHIN THE LAST 5 DAYS. Performed at [facility], [place] [address] [place], [place] [address]    Report Status 05/30/2021 FINAL  Final      Studies: US RENAL  Result Date: 05/29/2021 CLINICAL DATA:  Bacteremia EXAM: RENAL / URINARY TRACT ULTRASOUND COMPLETE COMPARISON:  Renal ultrasound 05/30/2020. FINDINGS: Right Kidney: Renal measurements: 8.4 x 4.4 x 3.8 cm = volume: 73 mL. Difficult to visualize. Increased renal echogenicity. No hydronephrosis. Left Kidney: Not visualized. Bladder: Appears normal for degree of bladder distention. Other: Transplanted kidney not evaluated. IMPRESSION: 1. Native right kidney is small and echogenic likely related to medical renal disease. 2. Native  left kidney not visualized. Electronically Signed   By: Ronney Asters M.D.   On: 05/29/2021 16:52      Flora Lipps, MD  [facility] 05/30/2021  If 7PM-7AM, please contact night-coverage

## 2021-05-30 NOTE — Progress Notes (Signed)
Pharmacy Antibiotic Note  Cynthia [name] is a 50 y.o. female admitted on 05/27/2021 with ESBL+ E.coli bacteremia. Pharmacy has been consulted for meropenem dosing.  Today, 05/30/21 WBC WNL SCr 1.36, CrCl 51 mL/min. Improved. Tmax 103  This is day #2 of IV antibiotics  Plan: Due to improved renal function, increase meropenem dose to 1 g IV q8h Monitor renal function, culture data  Height: 5\' 1"  (154.9 cm) Weight: 95.2 kg (209 lb 14.1 oz) IBW/kg (Calculated) : 47.8  Temp (24hrs), Avg:99.9 F (37.7 C), Min:98 F (36.7 C), Max:103 F (39.4 C)  Recent Labs  Lab 05/24/21 1020 05/27/21 2259 05/28/21 0040 05/29/21 0509 05/30/21 0536  WBC 8.4 9.0  --  3.9* 4.3  CREATININE 1.16* 1.21*  --  1.61* 1.36*  LATICACIDVEN  --   --  1.1  --   --      Estimated Creatinine Clearance: 51 mL/min (A) (by C-G formula based on SCr of 1.36 mg/dL (H)).    Allergies  Allergen Reactions   Other Rash    Burns skin.  Please use paper tape only Burns skin.  Please use paper tape only   Docetaxel Nausea Only    Diaphoretic.  Patient had hypersensitivity reaction to docetaxel.  See progress note from 05/24/2021.  Patient able to complete infusion.   Norvasc [Amlodipine] Swelling   Tape Other (See Comments)    Burns skin.  Please use paper tape only    Antimicrobials this admission:  meropenem 12/4 >>  Ceftriaxone x1 dose on 12/4  Dose adjustments this admission:  12/6: Meropenem 1 g IV q12h >> 1 g IV q8h  Microbiology results:  12/4 BCx: E.coli, ESBL+.  12/4 UCx: >100K E.coli, ESBL+   Thank you for allowing pharmacy to be a part of this patient's care.  Lenis Noon, PharmD 05/30/2021 12:08 PM

## 2021-05-31 DIAGNOSIS — R7881 Bacteremia: Secondary | ICD-10-CM | POA: Diagnosis not present

## 2021-05-31 DIAGNOSIS — Z940 Kidney transplant status: Secondary | ICD-10-CM | POA: Diagnosis not present

## 2021-05-31 DIAGNOSIS — N390 Urinary tract infection, site not specified: Secondary | ICD-10-CM | POA: Diagnosis not present

## 2021-05-31 DIAGNOSIS — E1022 Type 1 diabetes mellitus with diabetic chronic kidney disease: Secondary | ICD-10-CM | POA: Diagnosis not present

## 2021-05-31 LAB — BASIC METABOLIC PANEL
Anion gap: 6 (ref 5–15)
BUN: 32 mg/dL — ABNORMAL HIGH (ref 6–20)
CO2: 25 mmol/L (ref 22–32)
Calcium: 9.1 mg/dL (ref 8.9–10.3)
Chloride: 98 mmol/L (ref 98–111)
Creatinine, Ser: 1.28 mg/dL — ABNORMAL HIGH (ref 0.44–1.00)
GFR, Estimated: 50 mL/min — ABNORMAL LOW (ref >60)
Glucose, Bld: 200 mg/dL — ABNORMAL HIGH (ref 70–99)
Potassium: 4.7 mmol/L (ref 3.5–5.1)
Sodium: 129 mmol/L — ABNORMAL LOW (ref 135–145)

## 2021-05-31 LAB — GLUCOSE, CAPILLARY
Glucose-Capillary: 175 mg/dL — ABNORMAL HIGH (ref 70–99)
Glucose-Capillary: 229 mg/dL — ABNORMAL HIGH (ref 70–99)
Glucose-Capillary: 265 mg/dL — ABNORMAL HIGH (ref 70–99)
Glucose-Capillary: 252 mg/dL — ABNORMAL HIGH (ref 70–99)

## 2021-05-31 LAB — CBC
HCT: 31.1 % — ABNORMAL LOW (ref 36.0–46.0)
Hemoglobin: 10.1 g/dL — ABNORMAL LOW (ref 12.0–15.0)
MCH: 29.9 pg (ref 26.0–34.0)
MCHC: 32.5 g/dL (ref 30.0–36.0)
MCV: 92 fL (ref 80.0–100.0)
Platelets: 117 10*3/uL — ABNORMAL LOW (ref 150–400)
RBC: 3.38 MIL/uL — ABNORMAL LOW (ref 3.87–5.11)
RDW: 13.2 % (ref 11.5–15.5)
WBC: 6.7 10*3/uL (ref 4.0–10.5)
nRBC: 0.3 % — ABNORMAL HIGH (ref 0.0–0.2)

## 2021-05-31 NOTE — Care Management Important Message (Signed)
Important Message  Patient Details IM Letter given to the Patient. Name: Cynthia [name] MRN: [id number] Date of Birth: 1971-06-10   Medicare Important Message Given:  Yes     Kerin Salen 05/31/2021, 10:02 AM

## 2021-05-31 NOTE — Progress Notes (Signed)
Inpatient Diabetes Program Recommendations  AACE/ADA: New Consensus Statement on Inpatient Glycemic Control (2015)  Target Ranges:  Prepandial:   less than 140 mg/dL      Peak postprandial:   less than 180 mg/dL (1-2 hours)      Critically ill patients:  140 - 180 mg/dL   Lab Results  Component Value Date   GLUCAP 229 (H) 05/31/2021   HGBA1C 11.9 (H) 05/29/2021    Review of Glycemic Control  Diabetes history: DM 2 Outpatient Diabetes medications: Lantus 20 units bid, Humalog 5 units tid Current orders for Inpatient glycemic control:  Semglee 20 units bid Novolog 0-15 units tid + hs  Inpatient Diabetes Program Recommendations:    Spoke with pt and daughter at bedside regarding A1c and glucose control at home. Pt stopped checking her glucose when she started chemotherapy. When she was checking her fasting was 1500's-180's and in the evening it would go up to 200-300 range. Her PCP is working on getting the [place] to check her glucose with. Gave pt and daughter [place] to take home and use. Pt reports being on decadron for up to 3 days after chemotherapy treatments, most likely leading to the elevation ion A1c. Pts appetite was not great after chemo but is getting better. Pt has completed her last chemo appt. Discussed glucose and A1c goals.  Thanks,  Tama Headings RN, MSN, BC-ADM Inpatient Diabetes Coordinator Team Pager [phone] (8a-5p)

## 2021-05-31 NOTE — Progress Notes (Addendum)
PROGRESS NOTE  Cynthia [name] KCL:[id number] DOB: July 06, 1970 DOA: 05/27/2021 PCP: Arthur Holms, NP   LOS: 3 days   Brief narrative: Cynthia [name] is a 50 y.o. female with past medical history of renal transplant, CKD stage IIIa, diabetes mellitus on insulin, breast cancer currently on chemotherapy presented to the hospital with 2 to 3 days history of dysuria and urinary urgency and frequency followed by fever and chills.  Patient was recently treated with docetaxel and cyclophosphamide on 05/24/2021 and subsequently developed urinary symptoms.  She then received  Udenyca on 05/26/21 with fever of 100.6 F.  In the ED, patient was afebrile but tachycardic.  EKG and chest x-ray was unremarkable except for creatinine of 1.2.  Lactic acid was normal.  COVID and influenza was negative.  Blood culture and urine culture was drawn from the ED and patient was given 500 mL of LR and started on IV Rocephin.  Patient was then admitted to the hospital for UTI.  Assessment/Plan:  Principal Problem:   Acute UTI Active Problems:   Diabetes mellitus type I ([place])   History of renal transplant   HTN (hypertension)   Malignant neoplasm of lower-inner quadrant of left breast in female, estrogen receptor positive (HCC)   Stage 3a chronic kidney disease (CKD) (HCC)   Thrombocytopenia (HCC)  ESBL E. coli bacteremia likely secondary to acute cystitis. On meropenem.  ID has been consulted for complicated UTI with renal transplant and ESBL UTI.  Patient is immune suppressed due to being on chemotherapy as outpatient and on immune suppressant.  Renal ultrasound did not show any hydronephrosis. Discussed with infectious disease and will likely need a 2 week course of carbapenems. Can consider PICC placement once repeat cultures negative at 48 hours. Appears to be afebrile over last 24 hours  Hx of renal transplant; AKI on CKD IIIa  Deceased donor kidney recipient in 2017.  Serum creatinine was 1.2 on admission  On  tacrolimus mycophenolate and prednisone at home.  Creatinine did increase to 1.6, likely related to hypovolemia. Patient received IV fluids with improvement of creatinine back to baseline.  Spoke with Dr. Joelyn Oms nephrology who did not recommend adjusting dose of tacrolimus at this time.  Patient follows up with [facility] as outpatient.  Breast cancer status postlumpectomy and chemotherapy  received docetaxel and cyclophosphamide on 05/24/21 and Udenyca on 05/26/21 . Patient follows up with oncology as outpatient.    Insulin-dependent DM type 1, uncontrolled with hyperglycemia Patient has hemoglobin A1c of 11.6 in May 2022.  Continue sliding scale insulin Accu-Cheks, long-acting insulin diabetic diet.  Latest POC glucose of 265.  Patient reported that she was taking Lantus 40 units twice daily at home.    Thrombocytopenia  Overall platelet count increased to 117K today. No evidence of bleeding at this time.  Recent use of chemotherapy.   Mild hyponatremia. We will continue to monitor.  Asymptomatic.  Was on Lexapro at home.  Currently on hold.  Hypertension.  Accelerated at this time.  Patient is continuing to have pain.  We will focus on pain management.  Patient takes Coreg at home.  Will restart home medication regimen.  Generalized body pain aches or spasm.  Neurontin at home.  Will resume.  Hold off with NSAIDs for now.  Morbid Obesity -BMI 40.8 -counseled on importance of diet and exercise  DVT prophylaxis: SCDs Start: 05/28/21 4944   Code Status: Full code.  Family Communication:  Spoke with the patient's 2 daughters at bedside.  Status is:  Inpatient  Remains inpatient appropriate because: Need for IV antibiotics, further monitoring, ID follow-up,    Consultants: ID   Procedures: None  Anti-infectives:  Meropenem IV  Anti-infectives (From admission, onward)    Start     Dose/Rate Route Frequency Ordered Stop   05/30/21 1400  meropenem (MERREM) 1  g in sodium chloride 0.9 % 100 mL IVPB        1 g 200 mL/hr over 30 Minutes Intravenous Every 8 hours 05/30/21 1207     05/29/21 1600  meropenem (MERREM) 1 g in sodium chloride 0.9 % 100 mL IVPB  Status:  Discontinued        1 g 200 mL/hr over 30 Minutes Intravenous Every 12 hours 05/29/21 0802 05/30/21 1207   05/28/21 2000  meropenem (MERREM) 1 g in sodium chloride 0.9 % 100 mL IVPB  Status:  Discontinued        1 g 200 mL/hr over 30 Minutes Intravenous Every 8 hours 05/28/21 1819 05/29/21 0802   05/28/21 0115  cefTRIAXone (ROCEPHIN) 2 g in sodium chloride 0.9 % 100 mL IVPB  Status:  Discontinued        2 g 200 mL/hr over 30 Minutes Intravenous Every 24 hours 05/28/21 0100 05/28/21 1817      Subjective: Today, patient was seen and examined at bedside.  Complains of generalized body pain leg cramps chills.  Had 1 episode of vomiting today.  Temperature max of 103 F.  Objective: Vitals:   05/30/21 2102 05/31/21 0500  BP: (!) 171/73 (!) 135/54  Pulse: 84 88  Resp: 20 20  Temp: 98.1 F (36.7 C) 98 F (36.7 C)  SpO2: 98% 96%    Intake/Output Summary (Last 24 hours) at 05/31/2021 2055 Last data filed at 05/31/2021 1727 Gross per 24 hour  Intake 960 ml  Output 1 ml  Net 959 ml   Filed Weights   05/29/21 0500 05/30/21 0520 05/31/21 0500  Weight: 94.1 kg 95.2 kg 98 kg   Body mass index is 40.82 kg/m.   Physical Exam:  GENERAL: Patient is alert awake and oriented. Not in obvious distress.  Obese, on nasal cannula oxygen HENT: No scleral pallor or icterus. Pupils equally reactive to light. Oral mucosa is moist NECK: is supple, no gross swelling noted. CHEST: Clear to auscultation. No crackles or wheezes.  Diminished breath sounds bilaterally. CVS: S1 and S2 heard, no murmur. Regular rate and rhythm.  ABDOMEN: Soft, suprapubic tenderness on palpation.  Bowel sounds are present.  Bilateral costovertebral angle tenderness on palpation. EXTREMITIES: Trace lower extremity edema  noted.  Tenderness on palpation of the extremities. CNS: Cranial nerves are intact. No focal motor deficits. SKIN: warm and dry without rashes.  Data Review: I have personally reviewed the following laboratory data and studies,  CBC: Recent Labs  Lab 05/27/21 2259 05/29/21 0509 05/30/21 0536 05/30/21 1635 05/31/21 0554  WBC 9.0 3.9* 4.3 4.5 6.7  NEUTROABS 7.0  --  2.7 3.2  --   HGB 11.8* 10.4* 11.7* 10.7* 10.1*  HCT 35.4* 31.7* 35.2* 32.8* 31.1*  MCV 90.3 91.1 90.3 89.9 92.0  PLT 92* 47* 66* 88* 782*   Basic Metabolic Panel: Recent Labs  Lab 05/27/21 2259 05/29/21 0509 05/30/21 0536 05/31/21 0554  NA 128* 126* 129* 129*  K 4.9 5.0 5.1 4.7  CL 99 98 98 98  CO2 22 25 24 25   GLUCOSE 326* 228* 163* 200*  BUN 36* 44* 37* 32*  CREATININE 1.21* 1.61* 1.36* 1.28*  CALCIUM 9.2 8.5* 9.4 9.1  MG  --   --  1.8  --   PHOS  --   --  2.1*  --    Liver Function Tests: Recent Labs  Lab 05/27/21 2259  AST 14*  ALT 26  ALKPHOS 114  BILITOT 0.7  PROT 6.0*  ALBUMIN 3.5   No results for input(s): LIPASE, AMYLASE in the last 168 hours. No results for input(s): AMMONIA in the last 168 hours. Cardiac Enzymes: No results for input(s): CKTOTAL, CKMB, CKMBINDEX, TROPONINI in the last 168 hours. BNP (last 3 results) No results for input(s): BNP in the last 8760 hours.  ProBNP (last 3 results) No results for input(s): PROBNP in the last 8760 hours.  CBG: Recent Labs  Lab 05/30/21 1707 05/30/21 2059 05/31/21 0807 05/31/21 1224 05/31/21 1801  GLUCAP 257* 163* 175* 229* 265*   Recent Results (from the past 240 hour(s))  Resp Panel by RT-PCR (Flu A&B, Covid) Nasopharyngeal Swab     Status: None   Collection Time: 05/27/21 11:52 PM   Specimen: Nasopharyngeal Swab; Nasopharyngeal(NP) swabs in vial transport medium  Result Value Ref Range Status   SARS Coronavirus 2 by RT PCR NEGATIVE NEGATIVE Final    Comment: (NOTE) SARS-CoV-2 target nucleic acids are NOT DETECTED.  The  SARS-CoV-2 RNA is generally detectable in upper respiratory specimens during the acute phase of infection. The lowest concentration of SARS-CoV-2 viral copies this assay can detect is 138 copies/mL. A negative result does not preclude SARS-Cov-2 infection and should not be used as the sole basis for treatment or other patient management decisions. A negative result may occur with  improper specimen collection/handling, submission of specimen other than nasopharyngeal swab, presence of viral mutation(s) within the areas targeted by this assay, and inadequate number of viral copies(<138 copies/mL). A negative result must be combined with clinical observations, patient history, and epidemiological information. The expected result is Negative.  Fact Sheet for Patients:  EntrepreneurPulse.com.au  Fact Sheet for Healthcare Providers:  IncredibleEmployment.be  This test is no t yet approved or cleared by the [place] FDA and  has been authorized for detection and/or diagnosis of SARS-CoV-2 by FDA under an Emergency Use Authorization (EUA). This EUA will remain  in effect (meaning this test can be used) for the duration of the COVID-19 declaration under Section 564(b)(1) of the Act, 21 U.S.C.section 360bbb-3(b)(1), unless the authorization is terminated  or revoked sooner.       Influenza A by PCR NEGATIVE NEGATIVE Final   Influenza B by PCR NEGATIVE NEGATIVE Final    Comment: (NOTE) The Xpert Xpress SARS-CoV-2/FLU/RSV plus assay is intended as an aid in the diagnosis of influenza from Nasopharyngeal swab specimens and should not be used as a sole basis for treatment. Nasal washings and aspirates are unacceptable for Xpert Xpress SARS-CoV-2/FLU/RSV testing.  Fact Sheet for Patients: EntrepreneurPulse.com.au  Fact Sheet for Healthcare Providers: IncredibleEmployment.be  This test is not yet approved or  cleared by the [place] FDA and has been authorized for detection and/or diagnosis of SARS-CoV-2 by FDA under an Emergency Use Authorization (EUA). This EUA will remain in effect (meaning this test can be used) for the duration of the COVID-19 declaration under Section 564(b)(1) of the Act, 21 U.S.C. section 360bbb-3(b)(1), unless the authorization is terminated or revoked.  Performed at [facility], [place] [address], [place], [place] [address]   Urine Culture     Status: Abnormal   Collection Time: 05/28/21 12:17 AM  Specimen: Urine, Clean Catch  Result Value Ref Range Status   Specimen Description   Final    URINE, CLEAN CATCH Performed at [facility] [place] [address] [place], [place] [address]    Special Requests   Final    NONE Performed at [facility], [place] [address] [place], [place] [address]    Culture (A)  Final    >=100,000 COLONIES/mL ESCHERICHIA COLI Confirmed Extended Spectrum Beta-Lactamase Producer (ESBL).  In bloodstream infections from ESBL organisms, carbapenems are preferred over piperacillin/tazobactam. They are shown to have a lower risk of mortality.    Report Status 05/30/2021 FINAL  Final   Organism ID, Bacteria ESCHERICHIA COLI (A)  Final      Susceptibility   Escherichia coli - MIC*    AMPICILLIN >=32 RESISTANT Resistant     CEFAZOLIN >=64 RESISTANT Resistant     CEFEPIME 16 RESISTANT Resistant     CEFTRIAXONE >=64 RESISTANT Resistant     CIPROFLOXACIN >=4 RESISTANT Resistant     GENTAMICIN >=16 RESISTANT Resistant     IMIPENEM <=0.25 SENSITIVE Sensitive     NITROFURANTOIN <=16 SENSITIVE Sensitive     TRIMETH/SULFA <=20 SENSITIVE Sensitive     AMPICILLIN/SULBACTAM 4 SENSITIVE Sensitive     PIP/TAZO <=4 SENSITIVE Sensitive     * >=100,000 COLONIES/mL ESCHERICHIA COLI  Culture, blood (routine x 2)     Status: Abnormal   Collection Time: 05/28/21 12:23 AM   Specimen: BLOOD  Result  Value Ref Range Status   Specimen Description   Final    BLOOD RIGHT ANTECUBITAL Performed at [place] [address], [place], [place] [address]    Special Requests   Final    BOTTLES DRAWN AEROBIC AND ANAEROBIC Blood Culture results may not be optimal due to an excessive volume of blood received in culture bottles Performed at [place] [address] [place], [place] [address]    Culture  Setup Time   Final    GRAM NEGATIVE RODS IN BOTH AEROBIC AND ANAEROBIC BOTTLES CRITICAL RESULT CALLED TO, READ BACK BY AND VERIFIED WITH: PHARM D J.LEGGE ON [id number] AT 1804 BY E.PARRISH Performed at [facility], [place] [address], [place], [place] [address]    Culture (A)  Final    ESCHERICHIA COLI Confirmed Extended Spectrum Beta-Lactamase Producer (ESBL).  In bloodstream infections from ESBL organisms, carbapenems are preferred over piperacillin/tazobactam. They are shown to have a lower risk of mortality.    Report Status 05/30/2021 FINAL  Final   Organism ID, Bacteria ESCHERICHIA COLI  Final      Susceptibility   Escherichia coli - MIC*    AMPICILLIN >=32 RESISTANT Resistant     CEFAZOLIN >=64 RESISTANT Resistant     CEFEPIME 16 RESISTANT Resistant     CEFTAZIDIME RESISTANT Resistant     CEFTRIAXONE >=64 RESISTANT Resistant     CIPROFLOXACIN >=4 RESISTANT Resistant     GENTAMICIN >=16 RESISTANT Resistant     IMIPENEM <=0.25 SENSITIVE Sensitive     TRIMETH/SULFA <=20 SENSITIVE Sensitive     AMPICILLIN/SULBACTAM 8 SENSITIVE Sensitive     PIP/TAZO <=4 SENSITIVE Sensitive     * ESCHERICHIA COLI  Blood Culture ID Panel (Reflexed)     Status: Abnormal   Collection Time: 05/28/21 12:23 AM  Result Value Ref Range Status   Enterococcus faecalis NOT DETECTED NOT DETECTED Final   Enterococcus Faecium NOT DETECTED NOT DETECTED Final   Listeria monocytogenes NOT DETECTED NOT  DETECTED Final   Staphylococcus species NOT DETECTED NOT DETECTED Final    Staphylococcus aureus (BCID) NOT DETECTED NOT DETECTED Final   Staphylococcus epidermidis NOT DETECTED NOT DETECTED Final   Staphylococcus lugdunensis NOT DETECTED NOT DETECTED Final   Streptococcus species NOT DETECTED NOT DETECTED Final   Streptococcus agalactiae NOT DETECTED NOT DETECTED Final   Streptococcus pneumoniae NOT DETECTED NOT DETECTED Final   Streptococcus pyogenes NOT DETECTED NOT DETECTED Final   A.calcoaceticus-baumannii NOT DETECTED NOT DETECTED Final   Bacteroides fragilis NOT DETECTED NOT DETECTED Final   Enterobacterales DETECTED (A) NOT DETECTED Final    Comment: Enterobacterales represent a large order of gram negative bacteria, not a single organism. CRITICAL RESULT CALLED TO, READ BACK BY AND VERIFIED WITH: PHARM D J.LEGGE ON [id number] AT 1804 BY E.PARRISH    Enterobacter cloacae complex NOT DETECTED NOT DETECTED Final   Escherichia coli DETECTED (A) NOT DETECTED Final    Comment: CRITICAL RESULT CALLED TO, READ BACK BY AND VERIFIED WITH: PHARM D J.LEGGE ON [id number] AT 1804 BY E.PARRISH    Klebsiella aerogenes NOT DETECTED NOT DETECTED Final   Klebsiella oxytoca NOT DETECTED NOT DETECTED Final   Klebsiella pneumoniae NOT DETECTED NOT DETECTED Final   Proteus species NOT DETECTED NOT DETECTED Final   Salmonella species NOT DETECTED NOT DETECTED Final   Serratia marcescens NOT DETECTED NOT DETECTED Final   Haemophilus influenzae NOT DETECTED NOT DETECTED Final   Neisseria meningitidis NOT DETECTED NOT DETECTED Final   Pseudomonas aeruginosa NOT DETECTED NOT DETECTED Final   Stenotrophomonas maltophilia NOT DETECTED NOT DETECTED Final   Candida albicans NOT DETECTED NOT DETECTED Final   Candida auris NOT DETECTED NOT DETECTED Final   Candida glabrata NOT DETECTED NOT DETECTED Final   Candida krusei NOT DETECTED NOT DETECTED Final   Candida parapsilosis NOT DETECTED NOT DETECTED Final   Candida tropicalis NOT DETECTED NOT DETECTED Final   Cryptococcus  neoformans/gattii NOT DETECTED NOT DETECTED Final   CTX-M ESBL DETECTED (A) NOT DETECTED Final    Comment: CRITICAL RESULT CALLED TO, READ BACK BY AND VERIFIED WITH: PHARM D J.LEGGE ON [id number] AT 1804 BY E.PARRISH (NOTE) Extended spectrum beta-lactamase detected. Recommend a carbapenem as initial therapy.      Carbapenem resistance IMP NOT DETECTED NOT DETECTED Final   Carbapenem resistance KPC NOT DETECTED NOT DETECTED Final   Carbapenem resistance NDM NOT DETECTED NOT DETECTED Final   Carbapenem resist OXA 48 LIKE NOT DETECTED NOT DETECTED Final   Carbapenem resistance VIM NOT DETECTED NOT DETECTED Final    Comment: Performed at [facility], [place] [address] [place], [place] [address]  Culture, blood (routine x 2)     Status: Abnormal   Collection Time: 05/28/21 12:28 AM   Specimen: BLOOD  Result Value Ref Range Status   Specimen Description   Final    BLOOD BLOOD RIGHT HAND Performed at [place] [address], [place], [place] [address]    Special Requests   Final    BOTTLES DRAWN AEROBIC AND ANAEROBIC Blood Culture adequate volume Performed at [place] [address], [place],  [address]    Culture  Setup Time   Final    GRAM NEGATIVE RODS IN BOTH AEROBIC AND ANAEROBIC BOTTLES CRITICAL VALUE NOTED.  VALUE IS CONSISTENT WITH PREVIOUSLY REPORTED AND CALLED VALUE.    Culture (A)  Final    ESCHERICHIA COLI SUSCEPTIBILITIES PERFORMED ON PREVIOUS CULTURE WITHIN THE LAST 5 DAYS. Performed at [facility], [place] [address], [place], [place] [address]    Report Status 05/30/2021 FINAL  Final  Culture, blood (routine x 2)     Status: None (Preliminary result)   Collection Time: 05/30/21  2:23 PM   Specimen: BLOOD RIGHT FOREARM  Result Value Ref Range Status   Specimen Description   Final    BLOOD RIGHT FOREARM Performed at [place] [address], [place], [place] [address]    Special  Requests   Final    BOTTLES DRAWN AEROBIC ONLY Blood Culture results may not be optimal due to an inadequate volume of blood received in culture bottles Performed at [place] [address], [place], [place] [address]    Culture   Final    NO GROWTH < 24 HOURS Performed at [place] [address], [place], [place] [address]    Report Status PENDING  Incomplete  Culture, blood (routine x 2)     Status: None (Preliminary result)   Collection Time: 05/30/21  2:23 PM   Specimen: BLOOD RIGHT WRIST  Result Value Ref Range Status   Specimen Description   Final    BLOOD RIGHT WRIST Performed at [place] [address] [place], [place] [address]    Special Requests   Final    BOTTLES DRAWN AEROBIC ONLY Blood Culture results may not be optimal due to an inadequate volume of blood received in culture bottles Performed at [place] [address], [place], [place] [address]    Culture   Final    NO GROWTH < 24 HOURS Performed at [place] [address], [place], [place] [address]    Report Status PENDING  Incomplete     Studies: No results found.    Kathie Dike, MD  [facility] 05/31/2021  If 7PM-7AM, please contact night-coverage

## 2021-06-01 ENCOUNTER — Inpatient Hospital Stay: Payer: Self-pay

## 2021-06-01 DIAGNOSIS — Z940 Kidney transplant status: Secondary | ICD-10-CM | POA: Diagnosis not present

## 2021-06-01 DIAGNOSIS — E1022 Type 1 diabetes mellitus with diabetic chronic kidney disease: Secondary | ICD-10-CM | POA: Diagnosis not present

## 2021-06-01 DIAGNOSIS — N390 Urinary tract infection, site not specified: Secondary | ICD-10-CM | POA: Diagnosis not present

## 2021-06-01 DIAGNOSIS — R7881 Bacteremia: Secondary | ICD-10-CM | POA: Diagnosis not present

## 2021-06-01 LAB — CBC
HCT: 33.2 % — ABNORMAL LOW (ref 36.0–46.0)
Hemoglobin: 10.8 g/dL — ABNORMAL LOW (ref 12.0–15.0)
MCH: 29.9 pg (ref 26.0–34.0)
MCHC: 32.5 g/dL (ref 30.0–36.0)
MCV: 92 fL (ref 80.0–100.0)
Platelets: 157 10*3/uL (ref 150–400)
RBC: 3.61 MIL/uL — ABNORMAL LOW (ref 3.87–5.11)
RDW: 13.2 % (ref 11.5–15.5)
WBC: 19.5 10*3/uL — ABNORMAL HIGH (ref 4.0–10.5)
nRBC: 0.4 % — ABNORMAL HIGH (ref 0.0–0.2)

## 2021-06-01 LAB — RENAL FUNCTION PANEL
Albumin: 2.4 g/dL — ABNORMAL LOW (ref 3.5–5.0)
Anion gap: 6 (ref 5–15)
BUN: 29 mg/dL — ABNORMAL HIGH (ref 6–20)
CO2: 26 mmol/L (ref 22–32)
Calcium: 9.3 mg/dL (ref 8.9–10.3)
Chloride: 99 mmol/L (ref 98–111)
Creatinine, Ser: 1.26 mg/dL — ABNORMAL HIGH (ref 0.44–1.00)
GFR, Estimated: 51 mL/min — ABNORMAL LOW (ref >60)
Glucose, Bld: 224 mg/dL — ABNORMAL HIGH (ref 70–99)
Phosphorus: 2.3 mg/dL — ABNORMAL LOW (ref 2.5–4.6)
Potassium: 4.7 mmol/L (ref 3.5–5.1)
Sodium: 131 mmol/L — ABNORMAL LOW (ref 135–145)

## 2021-06-01 LAB — GLUCOSE, CAPILLARY
Glucose-Capillary: 250 mg/dL — ABNORMAL HIGH (ref 70–99)
Glucose-Capillary: 294 mg/dL — ABNORMAL HIGH (ref 70–99)
Glucose-Capillary: 237 mg/dL — ABNORMAL HIGH (ref 70–99)
Glucose-Capillary: 328 mg/dL — ABNORMAL HIGH (ref 70–99)

## 2021-06-01 MED ORDER — MAGIC MOUTHWASH W/LIDOCAINE
15.0000 mL | Freq: Once | ORAL | Status: AC
  Administered 2021-06-01: 15 mL via ORAL
  Filled 2021-06-01: qty 15

## 2021-06-01 MED ORDER — MAGIC MOUTHWASH W/LIDOCAINE: 10.0000 mL | Freq: Four times a day (QID) | ORAL | Status: DC

## 2021-06-01 MED ORDER — VALACYCLOVIR HCL 500 MG PO TABS
1000.0000 mg | ORAL_TABLET | Freq: Two times a day (BID) | ORAL | Status: DC
  Administered 2021-06-01 – 2021-06-03 (×5): 1000 mg via ORAL
  Filled 2021-06-01 (×8): qty 2

## 2021-06-01 MED ORDER — LIDOCAINE VISCOUS HCL 2 % MT SOLN
5.0000 mL | Freq: Four times a day (QID) | OROMUCOSAL | Status: DC
  Administered 2021-06-01 – 2021-06-02 (×4): 5 mL via OROMUCOSAL
  Filled 2021-06-01 (×12): qty 15

## 2021-06-01 MED ORDER — NYSTATIN 100000 UNIT/ML MT SUSP
5.0000 mL | Freq: Four times a day (QID) | OROMUCOSAL | Status: DC
Start: 2021-06-01 — End: 2021-06-04
  Administered 2021-06-01 – 2021-06-03 (×8): 500000 [IU] via ORAL
  Filled 2021-06-01 (×9): qty 5

## 2021-06-01 MED ORDER — INSULIN GLARGINE-YFGN 100 UNIT/ML ~~LOC~~ SOLN
25.0000 [IU] | Freq: Two times a day (BID) | SUBCUTANEOUS | Status: DC
  Administered 2021-06-01 – 2021-06-03 (×4): 25 [IU] via SUBCUTANEOUS
  Filled 2021-06-01 (×5): qty 0.25

## 2021-06-01 NOTE — Progress Notes (Signed)
PHARMACY CONSULT NOTE FOR:  OUTPATIENT  PARENTERAL ANTIBIOTIC THERAPY (OPAT)  Indication: ESBL E coli bacteremia Regimen: Ertapenem 1 gm IV Q 24 hours End date: 06/11/21   IV antibiotic discharge orders are pended. To discharging provider:  please sign these orders via discharge navigator,  Select New Orders & click on the button choice - Manage This Unsigned Work.     Thank you for allowing pharmacy to be a part of this patient's care.  Jimmy Footman, PharmD, BCPS, BCIDP Infectious Diseases Clinical Pharmacist Phone: [phone] 06/01/2021, 2:52 PM

## 2021-06-01 NOTE — Progress Notes (Signed)
Cynthia [name]   DOB:02/12/1971   HE#:[id number]   NTI#:[id number]  Oncology follow up note   Subjective: Patient is well-known to me, under my care for her breast cancer.  She is on adjuvant chemotherapy, last cycle on May 24, 2021.  She was admitted for urosepsis, and found to have E. coli bacteremia.  She is overall feeling better, afebrile for the past 2 days.  Still quite fatigued. Daughter at bedside    Objective:  Vitals:   06/01/21 1808 06/01/21 2104  BP: (!) 154/67 (!) 146/50  Pulse: 72 74  Resp: 16 14  Temp: 97.8 F (36.6 C) 98.2 F (36.8 C)  SpO2: 94% 94%    Body mass index is 40.86 kg/m.  Intake/Output Summary (Last 24 hours) at 06/01/2021 2323 Last data filed at 06/01/2021 1924 Gross per 24 hour  Intake 720 ml  Output --  Net 720 ml     Sclerae unicteric  Oropharynx clear  No peripheral adenopathy  MSK no focal spinal tenderness, no peripheral edema  Neuro nonfocal    CBG (last 3)  Recent Labs    06/01/21 1203 06/01/21 1806 06/01/21 2105  GLUCAP 294* 237* 328*     Labs:    Urine Studies No results for input(s): UHGB, CRYS in the last 72 hours.  Invalid input(s): UACOL, UAPR, USPG, UPH, UTP, UGL, UKET, UBIL, UNIT, UROB, [place], UEPI, UWBC, Junie Panning [place], [place], [place]  Basic Metabolic Panel: Recent Labs  Lab 05/27/21 2259 05/29/21 0509 05/30/21 0536 05/31/21 0554 06/01/21 0601  NA 128* 126* 129* 129* 131*  K 4.9 5.0 5.1 4.7 4.7  CL 99 98 98 98 99  CO2 22 25 24 25 26   GLUCOSE 326* 228* 163* 200* 224*  BUN 36* 44* 37* 32* 29*  CREATININE 1.21* 1.61* 1.36* 1.28* 1.26*  CALCIUM 9.2 8.5* 9.4 9.1 9.3  MG  --   --  1.8  --   --   PHOS  --   --  2.1*  --  2.3*   GFR Estimated Creatinine Clearance: 56 mL/min (A) (by C-G formula based on SCr of 1.26 mg/dL (H)). Liver Function Tests: Recent Labs  Lab 05/27/21 2259 06/01/21 0601  AST 14*  --   ALT 26  --   ALKPHOS 114  --   BILITOT 0.7  --   PROT 6.0*  --   ALBUMIN 3.5 2.4*   No  results for input(s): LIPASE, AMYLASE in the last 168 hours. No results for input(s): AMMONIA in the last 168 hours. Coagulation profile No results for input(s): INR, PROTIME in the last 168 hours.  CBC: Recent Labs  Lab 05/27/21 2259 05/29/21 0509 05/30/21 0536 05/30/21 1635 05/31/21 0554 06/01/21 0601  WBC 9.0 3.9* 4.3 4.5 6.7 19.5*  NEUTROABS 7.0  --  2.7 3.2  --   --   HGB 11.8* 10.4* 11.7* 10.7* 10.1* 10.8*  HCT 35.4* 31.7* 35.2* 32.8* 31.1* 33.2*  MCV 90.3 91.1 90.3 89.9 92.0 92.0  PLT 92* 47* 66* 88* 117* 157   Cardiac Enzymes: No results for input(s): CKTOTAL, CKMB, CKMBINDEX, TROPONINI in the last 168 hours. BNP: Invalid input(s): POCBNP CBG: Recent Labs  Lab 05/31/21 2145 06/01/21 0755 06/01/21 1203 06/01/21 1806 06/01/21 2105  GLUCAP 252* 250* 294* 237* 328*   D-Dimer No results for input(s): DDIMER in the last 72 hours. Hgb A1c No results for input(s): HGBA1C in the last 72 hours. Lipid Profile No results for input(s): CHOL, HDL, LDLCALC, TRIG, CHOLHDL, LDLDIRECT in  the last 72 hours. Thyroid function studies No results for input(s): TSH, T4TOTAL, T3FREE, THYROIDAB in the last 72 hours.  Invalid input(s): FREET3 Anemia work up No results for input(s): VITAMINB12, FOLATE, FERRITIN, TIBC, IRON, RETICCTPCT in the last 72 hours. Microbiology Recent Results (from the past 240 hour(s))  Resp Panel by RT-PCR (Flu A&B, Covid) Nasopharyngeal Swab     Status: None   Collection Time: 05/27/21 11:52 PM   Specimen: Nasopharyngeal Swab; Nasopharyngeal(NP) swabs in vial transport medium  Result Value Ref Range Status   SARS Coronavirus 2 by RT PCR NEGATIVE NEGATIVE Final    Comment: (NOTE) SARS-CoV-2 target nucleic acids are NOT DETECTED.  The SARS-CoV-2 RNA is generally detectable in upper respiratory specimens during the acute phase of infection. The lowest concentration of SARS-CoV-2 viral copies this assay can detect is 138 copies/mL. A negative result  does not preclude SARS-Cov-2 infection and should not be used as the sole basis for treatment or other patient management decisions. A negative result may occur with  improper specimen collection/handling, submission of specimen other than nasopharyngeal swab, presence of viral mutation(s) within the areas targeted by this assay, and inadequate number of viral copies(<138 copies/mL). A negative result must be combined with clinical observations, patient history, and epidemiological information. The expected result is Negative.  Fact Sheet for Patients:  EntrepreneurPulse.com.au  Fact Sheet for Healthcare Providers:  IncredibleEmployment.be  This test is no t yet approved or cleared by the [place] FDA and  has been authorized for detection and/or diagnosis of SARS-CoV-2 by FDA under an Emergency Use Authorization (EUA). This EUA will remain  in effect (meaning this test can be used) for the duration of the COVID-19 declaration under Section 564(b)(1) of the Act, 21 U.S.C.section 360bbb-3(b)(1), unless the authorization is terminated  or revoked sooner.       Influenza A by PCR NEGATIVE NEGATIVE Final   Influenza B by PCR NEGATIVE NEGATIVE Final    Comment: (NOTE) The Xpert Xpress SARS-CoV-2/FLU/RSV plus assay is intended as an aid in the diagnosis of influenza from Nasopharyngeal swab specimens and should not be used as a sole basis for treatment. Nasal washings and aspirates are unacceptable for Xpert Xpress SARS-CoV-2/FLU/RSV testing.  Fact Sheet for Patients: EntrepreneurPulse.com.au  Fact Sheet for Healthcare Providers: IncredibleEmployment.be  This test is not yet approved or cleared by the [place] FDA and has been authorized for detection and/or diagnosis of SARS-CoV-2 by FDA under an Emergency Use Authorization (EUA). This EUA will remain in effect (meaning this test can be used) for  the duration of the COVID-19 declaration under Section 564(b)(1) of the Act, 21 U.S.C. section 360bbb-3(b)(1), unless the authorization is terminated or revoked.  Performed at [facility], [place] [address], [place], [place] [address]   Urine Culture     Status: Abnormal   Collection Time: 05/28/21 12:17 AM   Specimen: Urine, Clean Catch  Result Value Ref Range Status   Specimen Description   Final    URINE, CLEAN CATCH Performed at [facility], [place] [address], [place], [place] [address]    Special Requests   Final    NONE Performed at [facility], [place] [address], [place],  [address]    Culture (A)  Final    >=100,000 COLONIES/mL ESCHERICHIA COLI Confirmed Extended Spectrum Beta-Lactamase Producer (ESBL).  In bloodstream infections from ESBL organisms, carbapenems are preferred over piperacillin/tazobactam. They are shown to have a lower risk of mortality.  Report Status 05/30/2021 FINAL  Final   Organism ID, Bacteria ESCHERICHIA COLI (A)  Final      Susceptibility   Escherichia coli - MIC*    AMPICILLIN >=32 RESISTANT Resistant     CEFAZOLIN >=64 RESISTANT Resistant     CEFEPIME 16 RESISTANT Resistant     CEFTRIAXONE >=64 RESISTANT Resistant     CIPROFLOXACIN >=4 RESISTANT Resistant     GENTAMICIN >=16 RESISTANT Resistant     IMIPENEM <=0.25 SENSITIVE Sensitive     NITROFURANTOIN <=16 SENSITIVE Sensitive     TRIMETH/SULFA <=20 SENSITIVE Sensitive     AMPICILLIN/SULBACTAM 4 SENSITIVE Sensitive     PIP/TAZO <=4 SENSITIVE Sensitive     * >=100,000 COLONIES/mL ESCHERICHIA COLI  Culture, blood (routine x 2)     Status: Abnormal   Collection Time: 05/28/21 12:23 AM   Specimen: BLOOD  Result Value Ref Range Status   Specimen Description   Final    BLOOD RIGHT ANTECUBITAL Performed at [facility] [address], [place], [place] [address]    Special Requests   Final    BOTTLES DRAWN  AEROBIC AND ANAEROBIC Blood Culture results may not be optimal due to an excessive volume of blood received in culture bottles Performed at [facility] [address], [place], [place] [address]    Culture  Setup Time   Final    GRAM NEGATIVE RODS IN BOTH AEROBIC AND ANAEROBIC BOTTLES CRITICAL RESULT CALLED TO, READ BACK BY AND VERIFIED WITH: PHARM D J.LEGGE ON [id number] AT 1804 BY E.PARRISH Performed at [facility], [place] [address], [place], [place] [address]    Culture (A)  Final    ESCHERICHIA COLI Confirmed Extended Spectrum Beta-Lactamase Producer (ESBL).  In bloodstream infections from ESBL organisms, carbapenems are preferred over piperacillin/tazobactam. They are shown to have a lower risk of mortality.    Report Status 05/30/2021 FINAL  Final   Organism ID, Bacteria ESCHERICHIA COLI  Final      Susceptibility   Escherichia coli - MIC*    AMPICILLIN >=32 RESISTANT Resistant     CEFAZOLIN >=64 RESISTANT Resistant     CEFEPIME 16 RESISTANT Resistant     CEFTAZIDIME RESISTANT Resistant     CEFTRIAXONE >=64 RESISTANT Resistant     CIPROFLOXACIN >=4 RESISTANT Resistant     GENTAMICIN >=16 RESISTANT Resistant     IMIPENEM <=0.25 SENSITIVE Sensitive     TRIMETH/SULFA <=20 SENSITIVE Sensitive     AMPICILLIN/SULBACTAM 8 SENSITIVE Sensitive     PIP/TAZO <=4 SENSITIVE Sensitive     * ESCHERICHIA COLI  Blood Culture ID Panel (Reflexed)     Status: Abnormal   Collection Time: 05/28/21 12:23 AM  Result Value Ref Range Status   Enterococcus faecalis NOT DETECTED NOT DETECTED Final   Enterococcus Faecium NOT DETECTED NOT DETECTED Final   Listeria monocytogenes NOT DETECTED NOT DETECTED Final   Staphylococcus species NOT DETECTED NOT DETECTED Final   Staphylococcus aureus (BCID) NOT DETECTED NOT DETECTED Final   Staphylococcus epidermidis NOT DETECTED NOT DETECTED Final   Staphylococcus lugdunensis NOT DETECTED NOT DETECTED Final   Streptococcus species NOT  DETECTED NOT DETECTED Final   Streptococcus agalactiae NOT DETECTED NOT DETECTED Final   Streptococcus pneumoniae NOT DETECTED NOT DETECTED Final   Streptococcus pyogenes NOT DETECTED NOT DETECTED Final   A.calcoaceticus-baumannii NOT DETECTED NOT DETECTED Final   Bacteroides fragilis NOT DETECTED NOT DETECTED Final   Enterobacterales DETECTED (A) NOT DETECTED Final    Comment: Enterobacterales  represent a large order of gram negative bacteria, not a single organism. CRITICAL RESULT CALLED TO, READ BACK BY AND VERIFIED WITH: PHARM D J.LEGGE ON [id number] AT 1804 BY E.PARRISH    Enterobacter cloacae complex NOT DETECTED NOT DETECTED Final   Escherichia coli DETECTED (A) NOT DETECTED Final    Comment: CRITICAL RESULT CALLED TO, READ BACK BY AND VERIFIED WITH: PHARM D J.LEGGE ON [id number] AT 1804 BY E.PARRISH    Klebsiella aerogenes NOT DETECTED NOT DETECTED Final   Klebsiella oxytoca NOT DETECTED NOT DETECTED Final   Klebsiella pneumoniae NOT DETECTED NOT DETECTED Final   Proteus species NOT DETECTED NOT DETECTED Final   Salmonella species NOT DETECTED NOT DETECTED Final   Serratia marcescens NOT DETECTED NOT DETECTED Final   Haemophilus influenzae NOT DETECTED NOT DETECTED Final   Neisseria meningitidis NOT DETECTED NOT DETECTED Final   Pseudomonas aeruginosa NOT DETECTED NOT DETECTED Final   Stenotrophomonas maltophilia NOT DETECTED NOT DETECTED Final   Candida albicans NOT DETECTED NOT DETECTED Final   Candida auris NOT DETECTED NOT DETECTED Final   Candida glabrata NOT DETECTED NOT DETECTED Final   Candida krusei NOT DETECTED NOT DETECTED Final   Candida parapsilosis NOT DETECTED NOT DETECTED Final   Candida tropicalis NOT DETECTED NOT DETECTED Final   Cryptococcus neoformans/gattii NOT DETECTED NOT DETECTED Final   CTX-M ESBL DETECTED (A) NOT DETECTED Final    Comment: CRITICAL RESULT CALLED TO, READ BACK BY AND VERIFIED WITH: PHARM D J.LEGGE ON [id number] AT 1804 BY  E.PARRISH (NOTE) Extended spectrum beta-lactamase detected. Recommend a carbapenem as initial therapy.      Carbapenem resistance IMP NOT DETECTED NOT DETECTED Final   Carbapenem resistance KPC NOT DETECTED NOT DETECTED Final   Carbapenem resistance NDM NOT DETECTED NOT DETECTED Final   Carbapenem resist OXA 48 LIKE NOT DETECTED NOT DETECTED Final   Carbapenem resistance VIM NOT DETECTED NOT DETECTED Final    Comment: Performed at [facility], [place] [address], [place], [place] [address]  Culture, blood (routine x 2)     Status: Abnormal   Collection Time: 05/28/21 12:28 AM   Specimen: BLOOD  Result Value Ref Range Status   Specimen Description   Final    BLOOD BLOOD RIGHT HAND Performed at [facility] [address] [place], [place] [address]    Special Requests   Final    BOTTLES DRAWN AEROBIC AND ANAEROBIC Blood Culture adequate volume Performed at [facility] [address] [place], [place] [address]    Culture  Setup Time   Final    GRAM NEGATIVE RODS IN BOTH AEROBIC AND ANAEROBIC BOTTLES CRITICAL VALUE NOTED.  VALUE IS CONSISTENT WITH PREVIOUSLY REPORTED AND CALLED VALUE.    Culture (A)  Final    ESCHERICHIA COLI SUSCEPTIBILITIES PERFORMED ON PREVIOUS CULTURE WITHIN THE LAST 5 DAYS. Performed at [facility], [place] [address], [place], [place] [address]    Report Status 05/30/2021 FINAL  Final  Culture, blood (routine x 2)     Status: None (Preliminary result)   Collection Time: 05/30/21  2:23 PM   Specimen: BLOOD RIGHT FOREARM  Result Value Ref Range Status   Specimen Description   Final    BLOOD RIGHT FOREARM Performed at [place] [address] [place], [place] [address]    Special Requests   Final    BOTTLES DRAWN AEROBIC ONLY Blood Culture results may not be optimal due to an inadequate volume of  blood received in culture bottles Performed at [facility], [place]  [address] [place], [place] [address]    Culture   Final    NO GROWTH 2 DAYS Performed at [facility] [address] [place], [place] [address]    Report Status PENDING  Incomplete  Culture, blood (routine x 2)     Status: None (Preliminary result)   Collection Time: 05/30/21  2:23 PM   Specimen: BLOOD RIGHT WRIST  Result Value Ref Range Status   Specimen Description   Final    BLOOD RIGHT WRIST Performed at [facility] [address], [place], [place] [address]    Special Requests   Final    BOTTLES DRAWN AEROBIC ONLY Blood Culture results may not be optimal due to an inadequate volume of blood received in culture bottles Performed at [facility] [address], [place], [place] [address]    Culture   Final    NO GROWTH 2 DAYS Performed at [facility] [address] [place], [place] [address]    Report Status PENDING  Incomplete      Studies:  [place] EKG SITE RITE  Result Date: 06/01/2021 If Site Rite image not attached, placement could not be confirmed due to current cardiac rhythm.   Assessment: 50 y.o. female   Urosepsis, improved ESBL E. coli bacteremia, secondary to #1 AKI on CKD, history of renal transplant Breast cancer, on adjuvant chemotherapy Poorly controlled diabetes HTN Obesity     Plan:  -I appreciate the primary hospitalist and ID teams's excellent care -Pt will be IV antibiotics until 12/20 -will postpone her next cycle chemo for 7-10 days, until after Christmas -her risk of infection is from chemotherapy, immunosuppressant for transplant kidney, and poorly controlled diabetes.  I encouraged her to follow-up with PCP for diabetic control, and I will reduce her dexamethasone dose around her chemo.   Truitt Merle, MD 06/01/2021

## 2021-06-01 NOTE — Progress Notes (Signed)
PROGRESS NOTE  Markeita Alicia [name]:[id number] DOB: 12/10/1970 DOA: 05/27/2021 PCP: Arthur Holms, NP   LOS: 4 days   Brief narrative: Cynthia [name] is a 50 y.o. female with past medical history of renal transplant, CKD stage IIIa, diabetes mellitus on insulin, breast cancer currently on chemotherapy presented to the hospital with 2 to 3 days history of dysuria and urinary urgency and frequency followed by fever and chills.  Patient was recently treated with docetaxel and cyclophosphamide on 05/24/2021 and subsequently developed urinary symptoms.  She then received  Udenyca on 05/26/21 with fever of 100.6 F.  In the ED, patient was afebrile but tachycardic.  EKG and chest x-ray was unremarkable except for creatinine of 1.2.  Lactic acid was normal.  COVID and influenza was negative.  Blood culture and urine culture was drawn from the ED and patient was given 500 mL of LR and started on IV Rocephin.  Patient was then admitted to the hospital for UTI.  Assessment/Plan:  Principal Problem:   Acute UTI Active Problems:   Diabetes mellitus type I ([place])   History of renal transplant   HTN (hypertension)   Malignant neoplasm of lower-inner quadrant of left breast in female, estrogen receptor positive (HCC)   Stage 3a chronic kidney disease (CKD) (HCC)   Thrombocytopenia (HCC)  ESBL E. coli bacteremia likely secondary to acute cystitis. On meropenem.  ID has been consulted for complicated UTI with renal transplant and ESBL UTI.  Patient is immune suppressed due to being on chemotherapy as outpatient and on immune suppressant.  Renal ultrasound did not show any hydronephrosis. Discussed with infectious disease and will likely need a 2 week course of carbapenems.  Since repeat cultures negative for 48 hours, will request PICC line placement  Hx of renal transplant; AKI on CKD IIIa  Deceased donor kidney recipient in 2017.  Serum creatinine was 1.2 on admission  On tacrolimus mycophenolate and  prednisone at home.  Creatinine did increase to 1.6, likely related to hypovolemia. Patient received IV fluids with improvement of creatinine back to baseline.  Spoke with Dr. Joelyn Oms nephrology who did not recommend adjusting dose of tacrolimus at this time.  Patient follows up with [facility] as outpatient.  Leukocytosis -Increase in WBC count from 6.7-19.5 -Does not appear septic or toxic -She has been afebrile -Discussed with her oncologist, Dr. Burr Medico who confirms the patient did receive G-CSF on 40/1 -This could certainly be contributing to leukocytosis -Continue to monitor  Breast cancer status postlumpectomy and chemotherapy  received docetaxel and cyclophosphamide on 05/24/21 and Udenyca on 05/26/21 . Patient follows up with oncology as outpatient.    Insulin-dependent DM type 1, uncontrolled with hyperglycemia Patient has hemoglobin A1c of 11.6 in May 2022.  Continue sliding scale insulin Accu-Cheks, long-acting insulin diabetic diet.  Latest POC glucose of 237.  Patient reported that she was taking Lantus 40 units twice daily at home.    Thrombocytopenia  Overall platelet count increased to 157K today. No evidence of bleeding at this time.  Recent use of chemotherapy.   Mild hyponatremia. We will continue to monitor.  Asymptomatic.  Was on Lexapro at home.  Currently on hold.  Hypertension.  Blood pressure currently stable on Coreg  Generalized body pain aches or spasm.  Neurontin at home.  Will resume.  Hold off with NSAIDs for now.  Morbid Obesity -BMI 40.8 -counseled on importance of diet and exercise  DVT prophylaxis: SCDs Start: 05/28/21 0272   Code Status: Full code.  Family  Communication:  Spoke with the patient's daughter at bedside.  Status is: Inpatient  Remains inpatient appropriate because: Need for IV antibiotics, further monitoring, ID follow-up,    Consultants: ID   Procedures: None  Anti-infectives:  Meropenem  IV  Anti-infectives (From admission, onward)    Start     Dose/Rate Route Frequency Ordered Stop   06/01/21 1000  valACYclovir (VALTREX) tablet 1,000 mg        1,000 mg Oral 2 times daily 06/01/21 0907 06/11/21 0959   05/30/21 1400  meropenem (MERREM) 1 g in sodium chloride 0.9 % 100 mL IVPB        1 g 200 mL/hr over 30 Minutes Intravenous Every 8 hours 05/30/21 1207 06/11/21 2359   05/29/21 1600  meropenem (MERREM) 1 g in sodium chloride 0.9 % 100 mL IVPB  Status:  Discontinued        1 g 200 mL/hr over 30 Minutes Intravenous Every 12 hours 05/29/21 0802 05/30/21 1207   05/28/21 2000  meropenem (MERREM) 1 g in sodium chloride 0.9 % 100 mL IVPB  Status:  Discontinued        1 g 200 mL/hr over 30 Minutes Intravenous Every 8 hours 05/28/21 1819 05/29/21 0802   05/28/21 0115  cefTRIAXone (ROCEPHIN) 2 g in sodium chloride 0.9 % 100 mL IVPB  Status:  Discontinued        2 g 200 mL/hr over 30 Minutes Intravenous Every 24 hours 05/28/21 0100 05/28/21 1817      Subjective: No further fevers.  Overall she is feeling better today.  Still feels weak and tired.  Objective: Vitals:   06/01/21 1808 06/01/21 2104  BP: (!) 154/67 (!) 146/50  Pulse: 72 74  Resp: 16 14  Temp: 97.8 F (36.6 C) 98.2 F (36.8 C)  SpO2: 94% 94%    Intake/Output Summary (Last 24 hours) at 06/01/2021 2128 Last data filed at 06/01/2021 1924 Gross per 24 hour  Intake 720 ml  Output --  Net 720 ml   Filed Weights   05/30/21 0520 05/31/21 0500 06/01/21 0528  Weight: 95.2 kg 98 kg 98.1 kg   Body mass index is 40.86 kg/m.   Physical Exam:  General exam: Alert, awake, oriented x 3 Respiratory system: Clear to auscultation. Respiratory effort normal. Cardiovascular system:RRR. No murmurs, rubs, gallops. Gastrointestinal system: Abdomen is nondistended, soft and nontender. No organomegaly or masses felt. Normal bowel sounds heard. Central nervous system: Alert and oriented. No focal neurological  deficits. Extremities: No C/C/E, +pedal pulses Skin: No rashes, lesions or ulcers Psychiatry: Judgement and insight appear normal. Mood & affect appropriate.    Data Review: I have personally reviewed the following laboratory data and studies,  CBC: Recent Labs  Lab 05/27/21 2259 05/29/21 0509 05/30/21 0536 05/30/21 1635 05/31/21 0554 06/01/21 0601  WBC 9.0 3.9* 4.3 4.5 6.7 19.5*  NEUTROABS 7.0  --  2.7 3.2  --   --   HGB 11.8* 10.4* 11.7* 10.7* 10.1* 10.8*  HCT 35.4* 31.7* 35.2* 32.8* 31.1* 33.2*  MCV 90.3 91.1 90.3 89.9 92.0 92.0  PLT 92* 47* 66* 88* 117* 389   Basic Metabolic Panel: Recent Labs  Lab 05/27/21 2259 05/29/21 0509 05/30/21 0536 05/31/21 0554 06/01/21 0601  NA 128* 126* 129* 129* 131*  K 4.9 5.0 5.1 4.7 4.7  CL 99 98 98 98 99  CO2 22 25 24 25 26   GLUCOSE 326* 228* 163* 200* 224*  BUN 36* 44* 37* 32* 29*  CREATININE 1.21* 1.61*  1.36* 1.28* 1.26*  CALCIUM 9.2 8.5* 9.4 9.1 9.3  MG  --   --  1.8  --   --   PHOS  --   --  2.1*  --  2.3*   Liver Function Tests: Recent Labs  Lab 05/27/21 2259 06/01/21 0601  AST 14*  --   ALT 26  --   ALKPHOS 114  --   BILITOT 0.7  --   PROT 6.0*  --   ALBUMIN 3.5 2.4*   No results for input(s): LIPASE, AMYLASE in the last 168 hours. No results for input(s): AMMONIA in the last 168 hours. Cardiac Enzymes: No results for input(s): CKTOTAL, CKMB, CKMBINDEX, TROPONINI in the last 168 hours. BNP (last 3 results) No results for input(s): BNP in the last 8760 hours.  ProBNP (last 3 results) No results for input(s): PROBNP in the last 8760 hours.  CBG: Recent Labs  Lab 05/31/21 2145 06/01/21 0755 06/01/21 1203 06/01/21 1806 06/01/21 2105  GLUCAP 252* 250* 294* 237* 328*   Recent Results (from the past 240 hour(s))  Resp Panel by RT-PCR (Flu A&B, Covid) Nasopharyngeal Swab     Status: None   Collection Time: 05/27/21 11:52 PM   Specimen: Nasopharyngeal Swab; Nasopharyngeal(NP) swabs in vial transport medium   Result Value Ref Range Status   SARS Coronavirus 2 by RT PCR NEGATIVE NEGATIVE Final    Comment: (NOTE) SARS-CoV-2 target nucleic acids are NOT DETECTED.  The SARS-CoV-2 RNA is generally detectable in upper respiratory specimens during the acute phase of infection. The lowest concentration of SARS-CoV-2 viral copies this assay can detect is 138 copies/mL. A negative result does not preclude SARS-Cov-2 infection and should not be used as the sole basis for treatment or other patient management decisions. A negative result may occur with  improper specimen collection/handling, submission of specimen other than nasopharyngeal swab, presence of viral mutation(s) within the areas targeted by this assay, and inadequate number of viral copies(<138 copies/mL). A negative result must be combined with clinical observations, patient history, and epidemiological information. The expected result is Negative.  Fact Sheet for Patients:  EntrepreneurPulse.com.au  Fact Sheet for Healthcare Providers:  IncredibleEmployment.be  This test is no t yet approved or cleared by the [place] FDA and  has been authorized for detection and/or diagnosis of SARS-CoV-2 by FDA under an Emergency Use Authorization (EUA). This EUA will remain  in effect (meaning this test can be used) for the duration of the COVID-19 declaration under Section 564(b)(1) of the Act, 21 U.S.C.section 360bbb-3(b)(1), unless the authorization is terminated  or revoked sooner.       Influenza A by PCR NEGATIVE NEGATIVE Final   Influenza B by PCR NEGATIVE NEGATIVE Final    Comment: (NOTE) The Xpert Xpress SARS-CoV-2/FLU/RSV plus assay is intended as an aid in the diagnosis of influenza from Nasopharyngeal swab specimens and should not be used as a sole basis for treatment. Nasal washings and aspirates are unacceptable for Xpert Xpress SARS-CoV-2/FLU/RSV testing.  Fact Sheet for  Patients: EntrepreneurPulse.com.au  Fact Sheet for Healthcare Providers: IncredibleEmployment.be  This test is not yet approved or cleared by the [place] FDA and has been authorized for detection and/or diagnosis of SARS-CoV-2 by FDA under an Emergency Use Authorization (EUA). This EUA will remain in effect (meaning this test can be used) for the duration of the COVID-19 declaration under Section 564(b)(1) of the Act, 21 U.S.C. section 360bbb-3(b)(1), unless the authorization is terminated or revoked.  Performed at [facility], [place] [address], [place], [place] [address]   Urine Culture     Status: Abnormal   Collection Time: 05/28/21 12:17 AM   Specimen: Urine, Clean Catch  Result Value Ref Range Status   Specimen Description   Final    URINE, CLEAN CATCH Performed at [facility], [place] [address], [place], [place] [address]    Special Requests   Final    NONE Performed at [facility], [place] [address], [place], [place] [address]    Culture (A)  Final    >=100,000 COLONIES/mL ESCHERICHIA COLI Confirmed Extended Spectrum Beta-Lactamase Producer (ESBL).  In bloodstream infections from ESBL organisms, carbapenems are preferred over piperacillin/tazobactam. They are shown to have a lower risk of mortality.    Report Status 05/30/2021 FINAL  Final   Organism ID, Bacteria ESCHERICHIA COLI (A)  Final      Susceptibility   Escherichia coli - MIC*    AMPICILLIN >=32 RESISTANT Resistant     CEFAZOLIN >=64 RESISTANT Resistant     CEFEPIME 16 RESISTANT Resistant     CEFTRIAXONE >=64 RESISTANT Resistant     CIPROFLOXACIN >=4 RESISTANT Resistant     GENTAMICIN >=16 RESISTANT Resistant     IMIPENEM <=0.25 SENSITIVE Sensitive     NITROFURANTOIN <=16 SENSITIVE Sensitive     TRIMETH/SULFA <=20 SENSITIVE Sensitive     AMPICILLIN/SULBACTAM 4 SENSITIVE Sensitive     PIP/TAZO <=4 SENSITIVE  Sensitive     * >=100,000 COLONIES/mL ESCHERICHIA COLI  Culture, blood (routine x 2)     Status: Abnormal   Collection Time: 05/28/21 12:23 AM   Specimen: BLOOD  Result Value Ref Range Status   Specimen Description   Final    BLOOD RIGHT ANTECUBITAL Performed at [facility] [address], [place], [place] [address]    Special Requests   Final    BOTTLES DRAWN AEROBIC AND ANAEROBIC Blood Culture results may not be optimal due to an excessive volume of blood received in culture bottles Performed at [facility] [address], [place], [place] [address]    Culture  Setup Time   Final    GRAM NEGATIVE RODS IN BOTH AEROBIC AND ANAEROBIC BOTTLES CRITICAL RESULT CALLED TO, READ BACK BY AND VERIFIED WITH: PHARM D J.LEGGE ON [id number] AT 1804 BY E.PARRISH Performed at [facility], [place] [address], [place], [place] [address]    Culture (A)  Final    ESCHERICHIA COLI Confirmed Extended Spectrum Beta-Lactamase Producer (ESBL).  In bloodstream infections from ESBL organisms, carbapenems are preferred over piperacillin/tazobactam. They are shown to have a lower risk of mortality.    Report Status 05/30/2021 FINAL  Final   Organism ID, Bacteria ESCHERICHIA COLI  Final      Susceptibility   Escherichia coli - MIC*    AMPICILLIN >=32 RESISTANT Resistant     CEFAZOLIN >=64 RESISTANT Resistant     CEFEPIME 16 RESISTANT Resistant     CEFTAZIDIME RESISTANT Resistant     CEFTRIAXONE >=64 RESISTANT Resistant     CIPROFLOXACIN >=4 RESISTANT Resistant     GENTAMICIN >=16 RESISTANT Resistant     IMIPENEM <=0.25 SENSITIVE Sensitive     TRIMETH/SULFA <=20 SENSITIVE Sensitive     AMPICILLIN/SULBACTAM 8 SENSITIVE Sensitive     PIP/TAZO <=4 SENSITIVE Sensitive     * ESCHERICHIA COLI  Blood Culture ID Panel (Reflexed)     Status: Abnormal   Collection Time: 05/28/21 12:23 AM  Result Value  Ref Range Status   Enterococcus faecalis NOT DETECTED NOT DETECTED  Final   Enterococcus Faecium NOT DETECTED NOT DETECTED Final   Listeria monocytogenes NOT DETECTED NOT DETECTED Final   Staphylococcus species NOT DETECTED NOT DETECTED Final   Staphylococcus aureus (BCID) NOT DETECTED NOT DETECTED Final   Staphylococcus epidermidis NOT DETECTED NOT DETECTED Final   Staphylococcus lugdunensis NOT DETECTED NOT DETECTED Final   Streptococcus species NOT DETECTED NOT DETECTED Final   Streptococcus agalactiae NOT DETECTED NOT DETECTED Final   Streptococcus pneumoniae NOT DETECTED NOT DETECTED Final   Streptococcus pyogenes NOT DETECTED NOT DETECTED Final   A.calcoaceticus-baumannii NOT DETECTED NOT DETECTED Final   Bacteroides fragilis NOT DETECTED NOT DETECTED Final   Enterobacterales DETECTED (A) NOT DETECTED Final    Comment: Enterobacterales represent a large order of gram negative bacteria, not a single organism. CRITICAL RESULT CALLED TO, READ BACK BY AND VERIFIED WITH: PHARM D J.LEGGE ON [id number] AT 1804 BY E.PARRISH    Enterobacter cloacae complex NOT DETECTED NOT DETECTED Final   Escherichia coli DETECTED (A) NOT DETECTED Final    Comment: CRITICAL RESULT CALLED TO, READ BACK BY AND VERIFIED WITH: PHARM D J.LEGGE ON [id number] AT 1804 BY E.PARRISH    Klebsiella aerogenes NOT DETECTED NOT DETECTED Final   Klebsiella oxytoca NOT DETECTED NOT DETECTED Final   Klebsiella pneumoniae NOT DETECTED NOT DETECTED Final   Proteus species NOT DETECTED NOT DETECTED Final   Salmonella species NOT DETECTED NOT DETECTED Final   Serratia marcescens NOT DETECTED NOT DETECTED Final   Haemophilus influenzae NOT DETECTED NOT DETECTED Final   Neisseria meningitidis NOT DETECTED NOT DETECTED Final   Pseudomonas aeruginosa NOT DETECTED NOT DETECTED Final   Stenotrophomonas maltophilia NOT DETECTED NOT DETECTED Final   Candida albicans NOT DETECTED NOT DETECTED Final   Candida auris NOT DETECTED NOT DETECTED Final   Candida glabrata NOT DETECTED NOT DETECTED Final    Candida krusei NOT DETECTED NOT DETECTED Final   Candida parapsilosis NOT DETECTED NOT DETECTED Final   Candida tropicalis NOT DETECTED NOT DETECTED Final   Cryptococcus neoformans/gattii NOT DETECTED NOT DETECTED Final   CTX-M ESBL DETECTED (A) NOT DETECTED Final    Comment: CRITICAL RESULT CALLED TO, READ BACK BY AND VERIFIED WITH: PHARM D J.LEGGE ON [id number] AT 1804 BY E.PARRISH (NOTE) Extended spectrum beta-lactamase detected. Recommend a carbapenem as initial therapy.      Carbapenem resistance IMP NOT DETECTED NOT DETECTED Final   Carbapenem resistance KPC NOT DETECTED NOT DETECTED Final   Carbapenem resistance NDM NOT DETECTED NOT DETECTED Final   Carbapenem resist OXA 48 LIKE NOT DETECTED NOT DETECTED Final   Carbapenem resistance VIM NOT DETECTED NOT DETECTED Final    Comment: Performed at [facility], [place] [address] [place], [place] [address]  Culture, blood (routine x 2)     Status: Abnormal   Collection Time: 05/28/21 12:28 AM   Specimen: BLOOD  Result Value Ref Range Status   Specimen Description   Final    BLOOD BLOOD RIGHT HAND Performed at [facility] [address] [place], [place] [address]    Special Requests   Final    BOTTLES DRAWN AEROBIC AND ANAEROBIC Blood Culture adequate volume Performed at [facility] [address], [place], [place] [address]    Culture  Setup Time   Final    GRAM NEGATIVE RODS IN BOTH AEROBIC AND ANAEROBIC BOTTLES CRITICAL VALUE NOTED.  VALUE IS CONSISTENT WITH PREVIOUSLY REPORTED AND  CALLED VALUE.    Culture (A)  Final    ESCHERICHIA COLI SUSCEPTIBILITIES PERFORMED ON PREVIOUS CULTURE WITHIN THE LAST 5 DAYS. Performed at [facility], [place] [address], [place], [place] [address]    Report Status 05/30/2021 FINAL  Final  Culture, blood (routine x 2)     Status: None (Preliminary result)   Collection Time: 05/30/21  2:23 PM   Specimen: BLOOD RIGHT FOREARM  Result Value Ref  Range Status   Specimen Description   Final    BLOOD RIGHT FOREARM Performed at [facility] [address] [place], [place] [address]    Special Requests   Final    BOTTLES DRAWN AEROBIC ONLY Blood Culture results may not be optimal due to an inadequate volume of blood received in culture bottles Performed at [facility] [address], [place], [place] [address]    Culture   Final    NO GROWTH 2 DAYS Performed at [facility] [address], [place], [place] [address]    Report Status PENDING  Incomplete  Culture, blood (routine x 2)     Status: None (Preliminary result)   Collection Time: 05/30/21  2:23 PM   Specimen: BLOOD RIGHT WRIST  Result Value Ref Range Status   Specimen Description   Final    BLOOD RIGHT WRIST Performed at [facility] [address] [place], [place] [address]    Special Requests   Final    BOTTLES DRAWN AEROBIC ONLY Blood Culture results may not be optimal due to an inadequate volume of blood received in culture bottles Performed at [facility] [address] [place], [place] [address]    Culture   Final    NO GROWTH 2 DAYS Performed at [facility] [address] [place], [place] [address]    Report Status PENDING  Incomplete     Studies: No results found.    Kathie Dike, MD  [facility] 06/01/2021  If 7PM-7AM, please contact night-coverage

## 2021-06-01 NOTE — Progress Notes (Signed)
Inpatient Diabetes Program Recommendations  AACE/ADA: New Consensus Statement on Inpatient Glycemic Control (2015)  Target Ranges:  Prepandial:   less than 140 mg/dL      Peak postprandial:   less than 180 mg/dL (1-2 hours)      Critically ill patients:  140 - 180 mg/dL   Lab Results  Component Value Date   GLUCAP 250 (H) 06/01/2021   HGBA1C 11.9 (H) 05/29/2021    Latest Reference Range & Units 05/31/21 08:07 05/31/21 12:24 05/31/21 18:01 05/31/21 21:45 06/01/21 07:55  Glucose-Capillary 70 - 99 mg/dL 175 (H) 229 (H) 265 (H) 252 (H) 250 (H)   Review of Glycemic Control  Diabetes history: type 2 Outpatient Diabetes medications: Lantus 20 units BID, Novolog 5 units TID (per patient) Current orders for Inpatient glycemic control: Semglee 20 units BID, Novolog MODERATE correction scale TID & 0-5 units HS scale  Inpatient Diabetes Program Recommendations:   Noted that blood sugars are in the 200's today.  Recommend adding Novolog 3 units TID with meals if patient eats at least 50% of meal and blood sugars continue to be greater than 180 mg/dl.   Harvel Ricks RN BSN CDE Diabetes Coordinator Pager: [phone]  8am-5pm

## 2021-06-01 NOTE — Progress Notes (Addendum)
[facility] Infectious Diseases Follow Up Note  Patient Identification: Patient Name: Cynthia [name] MRN: [id number] [place] Date: 05/27/2021 10:27 PM Age: 50 y.o.Today's Date: 06/01/2021   Reason for Visit: Pyelonephritis  Principal Problem:   Acute UTI Active Problems:   Diabetes mellitus type I ([place])   History of renal transplant   HTN (hypertension)   Malignant neoplasm of lower-inner quadrant of left breast in female, estrogen receptor positive (HCC)   Stage 3a chronic kidney disease (CKD) (HCC)   Thrombocytopenia (HCC)  Antibiotics: Ceftriaxone 12/3                    Meropenem 12/4-current    Lines/Hardware: Left arm AV fistula   Interval Events: Afebrile for more than 24 hours, WBC went up to 19.5.  Repeat blood culture 12/6 no growth in less than 24 hours  Assessment # Leukocytosis: Afebrile for more than 24 hours.  Complains of ulcers in the lateral side of the tongue(painful).  Has some headache but otherwise no complaints and feeling better  # E. coli bacteremia, ESBL secondary to complicated UTI - US kidneys unremarkable for acute changes   # Breast cancer status post lumpectomy and undergoing active chemotherapy # Pancytopenia in the setting of recent chemotherapy- resolved  # DD KT in 2017 on immunosuppression(tacrolimus, prednisone and mycophenolate) with CKD # DM, uncontrolled-last A1c 11.9   Recommendations Continue meropenem as is, plan for 14 days total. Will avoid PO bactrim in the setting of CKD  OK to place IV access if repeat blood cx are negative in 48 hrs  Fu repeat blood cultures  Repeat CBC w diff tomorrow Will add valacyclovir for painful ulcers in oral cavity  Following   Rest of the management as per the primary team. Thank you for the consult. Please page with pertinent questions or concerns.  ______________________________________________________________________ Subjective patient  seen and examined at the bedside.  Some headache and painful ulcers in the lateral side of tongue.  Daughter at bedside helping with interpretation  Vitals BP (!) 168/52 (BP Location: Right Arm)   Pulse 81   Temp 98.5 F (36.9 C) (Oral)   Resp 16   Ht 5\' 1"  (1.549 m)   Wt 98.1 kg   SpO2 93%   BMI 40.86 kg/m     Physical Exam Constitutional: Lying in bed, not in acute distress    comments:      Cardiovascular:     Rate and Rhythm: Normal rate and regular rhythm.     Heart sounds:   Pulmonary:     Effort: Pulmonary effort is normal.     Comments:   Abdominal:     Palpations: Abdomen is soft.     Tenderness: Nontender  Musculoskeletal:        General: No swelling or tenderness.   Skin:    Comments: No lesions or rashes.  No phlebitis on skin exam  Neurological:     General: No focal deficit present.  Awake alert and oriented  Psychiatric:        Mood and Affect: Mood normal.  Calm and cooperative  Pertinent Microbiology Results for orders placed or performed during the hospital encounter of 05/27/21  Resp Panel by RT-PCR (Flu A&B, Covid) Nasopharyngeal Swab     Status: None   Collection Time: 05/27/21 11:52 PM   Specimen: Nasopharyngeal Swab; Nasopharyngeal(NP) swabs in vial transport medium  Result Value Ref Range Status   SARS Coronavirus 2 by RT PCR NEGATIVE NEGATIVE Final  Comment: (NOTE) SARS-CoV-2 target nucleic acids are NOT DETECTED.  The SARS-CoV-2 RNA is generally detectable in upper respiratory specimens during the acute phase of infection. The lowest concentration of SARS-CoV-2 viral copies this assay can detect is 138 copies/mL. A negative result does not preclude SARS-Cov-2 infection and should not be used as the sole basis for treatment or other patient management decisions. A negative result may occur with  improper specimen collection/handling, submission of specimen other than nasopharyngeal swab, presence of viral mutation(s) within  the areas targeted by this assay, and inadequate number of viral copies(<138 copies/mL). A negative result must be combined with clinical observations, patient history, and epidemiological information. The expected result is Negative.  Fact Sheet for Patients:  EntrepreneurPulse.com.au  Fact Sheet for Healthcare Providers:  IncredibleEmployment.be  This test is no t yet approved or cleared by the [place] FDA and  has been authorized for detection and/or diagnosis of SARS-CoV-2 by FDA under an Emergency Use Authorization (EUA). This EUA will remain  in effect (meaning this test can be used) for the duration of the COVID-19 declaration under Section 564(b)(1) of the Act, 21 U.S.C.section 360bbb-3(b)(1), unless the authorization is terminated  or revoked sooner.       Influenza A by PCR NEGATIVE NEGATIVE Final   Influenza B by PCR NEGATIVE NEGATIVE Final    Comment: (NOTE) The Xpert Xpress SARS-CoV-2/FLU/RSV plus assay is intended as an aid in the diagnosis of influenza from Nasopharyngeal swab specimens and should not be used as a sole basis for treatment. Nasal washings and aspirates are unacceptable for Xpert Xpress SARS-CoV-2/FLU/RSV testing.  Fact Sheet for Patients: EntrepreneurPulse.com.au  Fact Sheet for Healthcare Providers: IncredibleEmployment.be  This test is not yet approved or cleared by the [place] FDA and has been authorized for detection and/or diagnosis of SARS-CoV-2 by FDA under an Emergency Use Authorization (EUA). This EUA will remain in effect (meaning this test can be used) for the duration of the COVID-19 declaration under Section 564(b)(1) of the Act, 21 U.S.C. section 360bbb-3(b)(1), unless the authorization is terminated or revoked.  Performed at [facility], [place] [address], [place], [place] [address]   Urine Culture     Status: Abnormal    Collection Time: 05/28/21 12:17 AM   Specimen: Urine, Clean Catch  Result Value Ref Range Status   Specimen Description   Final    URINE, CLEAN CATCH Performed at [facility], [place] [address], [place], [place] [address]    Special Requests   Final    NONE Performed at [facility], [place] [address] [place], [place] [address]    Culture (A)  Final    >=100,000 COLONIES/mL ESCHERICHIA COLI Confirmed Extended Spectrum Beta-Lactamase Producer (ESBL).  In bloodstream infections from ESBL organisms, carbapenems are preferred over piperacillin/tazobactam. They are shown to have a lower risk of mortality.    Report Status 05/30/2021 FINAL  Final   Organism ID, Bacteria ESCHERICHIA COLI (A)  Final      Susceptibility   Escherichia coli - MIC*    AMPICILLIN >=32 RESISTANT Resistant     CEFAZOLIN >=64 RESISTANT Resistant     CEFEPIME 16 RESISTANT Resistant     CEFTRIAXONE >=64 RESISTANT Resistant     CIPROFLOXACIN >=4 RESISTANT Resistant     GENTAMICIN >=16 RESISTANT Resistant     IMIPENEM <=0.25 SENSITIVE Sensitive     NITROFURANTOIN <=16 SENSITIVE Sensitive     TRIMETH/SULFA <=20 SENSITIVE Sensitive     AMPICILLIN/SULBACTAM  4 SENSITIVE Sensitive     PIP/TAZO <=4 SENSITIVE Sensitive     * >=100,000 COLONIES/mL ESCHERICHIA COLI  Culture, blood (routine x 2)     Status: Abnormal   Collection Time: 05/28/21 12:23 AM   Specimen: BLOOD  Result Value Ref Range Status   Specimen Description   Final    BLOOD RIGHT ANTECUBITAL Performed at [facility] [address] [place], [place] [address]    Special Requests   Final    BOTTLES DRAWN AEROBIC AND ANAEROBIC Blood Culture results may not be optimal due to an excessive volume of blood received in culture bottles Performed at [facility] [address] [place], [place] [address]    Culture  Setup Time   Final    GRAM NEGATIVE RODS IN BOTH AEROBIC AND ANAEROBIC  BOTTLES CRITICAL RESULT CALLED TO, READ BACK BY AND VERIFIED WITH: PHARM D J.LEGGE ON [id number] AT 1804 BY E.PARRISH Performed at [facility], [place] [address] [place], [place] [address]    Culture (A)  Final    ESCHERICHIA COLI Confirmed Extended Spectrum Beta-Lactamase Producer (ESBL).  In bloodstream infections from ESBL organisms, carbapenems are preferred over piperacillin/tazobactam. They are shown to have a lower risk of mortality.    Report Status 05/30/2021 FINAL  Final   Organism ID, Bacteria ESCHERICHIA COLI  Final      Susceptibility   Escherichia coli - MIC*    AMPICILLIN >=32 RESISTANT Resistant     CEFAZOLIN >=64 RESISTANT Resistant     CEFEPIME 16 RESISTANT Resistant     CEFTAZIDIME RESISTANT Resistant     CEFTRIAXONE >=64 RESISTANT Resistant     CIPROFLOXACIN >=4 RESISTANT Resistant     GENTAMICIN >=16 RESISTANT Resistant     IMIPENEM <=0.25 SENSITIVE Sensitive     TRIMETH/SULFA <=20 SENSITIVE Sensitive     AMPICILLIN/SULBACTAM 8 SENSITIVE Sensitive     PIP/TAZO <=4 SENSITIVE Sensitive     * ESCHERICHIA COLI  Blood Culture ID Panel (Reflexed)     Status: Abnormal   Collection Time: 05/28/21 12:23 AM  Result Value Ref Range Status   Enterococcus faecalis NOT DETECTED NOT DETECTED Final   Enterococcus Faecium NOT DETECTED NOT DETECTED Final   Listeria monocytogenes NOT DETECTED NOT DETECTED Final   Staphylococcus species NOT DETECTED NOT DETECTED Final   Staphylococcus aureus (BCID) NOT DETECTED NOT DETECTED Final   Staphylococcus epidermidis NOT DETECTED NOT DETECTED Final   Staphylococcus lugdunensis NOT DETECTED NOT DETECTED Final   Streptococcus species NOT DETECTED NOT DETECTED Final   Streptococcus agalactiae NOT DETECTED NOT DETECTED Final   Streptococcus pneumoniae NOT DETECTED NOT DETECTED Final   Streptococcus pyogenes NOT DETECTED NOT DETECTED Final   A.calcoaceticus-baumannii NOT DETECTED NOT DETECTED Final   Bacteroides fragilis NOT DETECTED NOT  DETECTED Final   Enterobacterales DETECTED (A) NOT DETECTED Final    Comment: Enterobacterales represent a large order of gram negative bacteria, not a single organism. CRITICAL RESULT CALLED TO, READ BACK BY AND VERIFIED WITH: PHARM D J.LEGGE ON [id number] AT 1804 BY E.PARRISH    Enterobacter cloacae complex NOT DETECTED NOT DETECTED Final   Escherichia coli DETECTED (A) NOT DETECTED Final    Comment: CRITICAL RESULT CALLED TO, READ BACK BY AND VERIFIED WITH: PHARM D J.LEGGE ON [id number] AT 1804 BY E.PARRISH    Klebsiella aerogenes NOT DETECTED NOT DETECTED Final   Klebsiella oxytoca NOT DETECTED NOT DETECTED Final   Klebsiella pneumoniae NOT DETECTED NOT DETECTED Final  Proteus species NOT DETECTED NOT DETECTED Final   Salmonella species NOT DETECTED NOT DETECTED Final   Serratia marcescens NOT DETECTED NOT DETECTED Final   Haemophilus influenzae NOT DETECTED NOT DETECTED Final   Neisseria meningitidis NOT DETECTED NOT DETECTED Final   Pseudomonas aeruginosa NOT DETECTED NOT DETECTED Final   Stenotrophomonas maltophilia NOT DETECTED NOT DETECTED Final   Candida albicans NOT DETECTED NOT DETECTED Final   Candida auris NOT DETECTED NOT DETECTED Final   Candida glabrata NOT DETECTED NOT DETECTED Final   Candida krusei NOT DETECTED NOT DETECTED Final   Candida parapsilosis NOT DETECTED NOT DETECTED Final   Candida tropicalis NOT DETECTED NOT DETECTED Final   Cryptococcus neoformans/gattii NOT DETECTED NOT DETECTED Final   CTX-M ESBL DETECTED (A) NOT DETECTED Final    Comment: CRITICAL RESULT CALLED TO, READ BACK BY AND VERIFIED WITH: PHARM D J.LEGGE ON [id number] AT 1804 BY E.PARRISH (NOTE) Extended spectrum beta-lactamase detected. Recommend a carbapenem as initial therapy.      Carbapenem resistance IMP NOT DETECTED NOT DETECTED Final   Carbapenem resistance KPC NOT DETECTED NOT DETECTED Final   Carbapenem resistance NDM NOT DETECTED NOT DETECTED Final   Carbapenem resist OXA 48  LIKE NOT DETECTED NOT DETECTED Final   Carbapenem resistance VIM NOT DETECTED NOT DETECTED Final    Comment: Performed at [facility], [place] [address] [place], [place] [address]  Culture, blood (routine x 2)     Status: Abnormal   Collection Time: 05/28/21 12:28 AM   Specimen: BLOOD  Result Value Ref Range Status   Specimen Description   Final    BLOOD BLOOD RIGHT HAND Performed at [place] [address], [place], [place] [address]    Special Requests   Final    BOTTLES DRAWN AEROBIC AND ANAEROBIC Blood Culture adequate volume Performed at [place] [address] [place], [place] [address]    Culture  Setup Time   Final    GRAM NEGATIVE RODS IN BOTH AEROBIC AND ANAEROBIC BOTTLES CRITICAL VALUE NOTED.  VALUE IS CONSISTENT WITH PREVIOUSLY REPORTED AND CALLED VALUE.    Culture (A)  Final    ESCHERICHIA COLI SUSCEPTIBILITIES PERFORMED ON PREVIOUS CULTURE WITHIN THE LAST 5 DAYS. Performed at [facility], [place] [address] [place], [place] [address]    Report Status 05/30/2021 FINAL  Final  Culture, blood (routine x 2)     Status: None (Preliminary result)   Collection Time: 05/30/21  2:23 PM   Specimen: BLOOD RIGHT FOREARM  Result Value Ref Range Status   Specimen Description   Final    BLOOD RIGHT FOREARM Performed at [facility] [address], [place], [place] [address]    Special Requests   Final    BOTTLES DRAWN AEROBIC ONLY Blood Culture results may not be optimal due to an inadequate volume of blood received in culture bottles Performed at [facility] [address] [place], [place] [address]    Culture   Final    NO GROWTH < 24 HOURS Performed at [facility] [address], [place],  [address]    Report Status PENDING  Incomplete  Culture, blood (routine x 2)     Status: None (Preliminary result)   Collection Time: 05/30/21  2:23 PM   Specimen: BLOOD  RIGHT WRIST  Result Value Ref Range Status   Specimen Description   Final    BLOOD RIGHT WRIST Performed at  [facility], [place] [address] [place], [place] [address]    Special Requests   Final    BOTTLES DRAWN AEROBIC ONLY Blood Culture results may not be optimal due to an inadequate volume of blood received in culture bottles Performed at [facility] [address], [place], [place] [address]    Culture   Final    NO GROWTH < 24 HOURS Performed at [facility] [address], [place], [place] [address]    Report Status PENDING  Incomplete     Pertinent Lab. CBC Latest Ref Rng & Units 06/01/2021 05/31/2021 05/30/2021  WBC 4.0 - 10.5 K/uL 19.5(H) 6.7 4.5  Hemoglobin 12.0 - 15.0 g/dL 10.8(L) 10.1(L) 10.7(L)  Hematocrit 36.0 - 46.0 % 33.2(L) 31.1(L) 32.8(L)  Platelets 150 - 400 K/uL 157 117(L) 88(L)   CMP Latest Ref Rng & Units 06/01/2021 05/31/2021 05/30/2021  Glucose 70 - 99 mg/dL 224(H) 200(H) 163(H)  BUN 6 - 20 mg/dL 29(H) 32(H) 37(H)  Creatinine 0.44 - 1.00 mg/dL 1.26(H) 1.28(H) 1.36(H)  Sodium 135 - 145 mmol/L 131(L) 129(L) 129(L)  Potassium 3.5 - 5.1 mmol/L 4.7 4.7 5.1  Chloride 98 - 111 mmol/L 99 98 98  CO2 22 - 32 mmol/L 26 25 24   Calcium 8.9 - 10.3 mg/dL 9.3 9.1 9.4  Total Protein 6.5 - 8.1 g/dL - - -  Total Bilirubin 0.3 - 1.2 mg/dL - - -  Alkaline Phos 38 - 126 U/L - - -  AST 15 - 41 U/L - - -  ALT 0 - 44 U/L - - -     Pertinent Imaging today Plain films and CT images have been personally visualized and interpreted; radiology reports have been reviewed. Decision making incorporated into the Impression / Recommendations. US renal 12/6  FINDINGS: Right Kidney:   Renal measurements: 8.4 x 4.4 x 3.8 cm = volume: 73 mL. Difficult to visualize. Increased renal echogenicity. No hydronephrosis.   Left Kidney:   Not visualized.   Bladder:   Appears normal for degree of bladder distention.   Other:   Transplanted  kidney not evaluated.   IMPRESSION: 1. Native right kidney is small and echogenic likely related to medical renal disease. 2. Native left kidney not visualized.  I spent more than 40 minutes for this patient encounter including review of prior medical records, coordination of care  with greater than 50% of time being face to face/counseling and discussing diagnostics/treatment plan with the patient/family.  Electronically signed by:   Rosiland Oz, MD Infectious Disease Physician [facility] for Infectious Disease Pager: [phone]

## 2021-06-02 DIAGNOSIS — Z940 Kidney transplant status: Secondary | ICD-10-CM | POA: Diagnosis not present

## 2021-06-02 DIAGNOSIS — R7881 Bacteremia: Secondary | ICD-10-CM | POA: Diagnosis not present

## 2021-06-02 DIAGNOSIS — N390 Urinary tract infection, site not specified: Secondary | ICD-10-CM | POA: Diagnosis not present

## 2021-06-02 DIAGNOSIS — E1022 Type 1 diabetes mellitus with diabetic chronic kidney disease: Secondary | ICD-10-CM | POA: Diagnosis not present

## 2021-06-02 LAB — CBC WITH DIFFERENTIAL/PLATELET
Abs Immature Granulocytes: 4.94 10*3/uL — ABNORMAL HIGH (ref 0.00–0.07)
Basophils Absolute: 0.1 10*3/uL (ref 0.0–0.1)
Basophils Relative: 0 %
Eosinophils Absolute: 0.3 10*3/uL (ref 0.0–0.5)
Eosinophils Relative: 1 %
HCT: 31 % — ABNORMAL LOW (ref 36.0–46.0)
Hemoglobin: 10.1 g/dL — ABNORMAL LOW (ref 12.0–15.0)
Immature Granulocytes: 15 %
Lymphocytes Relative: 7 %
Lymphs Abs: 2.1 10*3/uL (ref 0.7–4.0)
MCH: 29.5 pg (ref 26.0–34.0)
MCHC: 32.6 g/dL (ref 30.0–36.0)
MCV: 90.6 fL (ref 80.0–100.0)
Monocytes Absolute: 1.6 10*3/uL — ABNORMAL HIGH (ref 0.1–1.0)
Monocytes Relative: 5 %
Neutro Abs: 23.5 10*3/uL — ABNORMAL HIGH (ref 1.7–7.7)
Neutrophils Relative %: 72 %
Platelets: 190 10*3/uL (ref 150–400)
RBC: 3.42 MIL/uL — ABNORMAL LOW (ref 3.87–5.11)
RDW: 13.2 % (ref 11.5–15.5)
WBC: 32.4 10*3/uL — ABNORMAL HIGH (ref 4.0–10.5)
nRBC: 0.6 % — ABNORMAL HIGH (ref 0.0–0.2)

## 2021-06-02 LAB — GLUCOSE, CAPILLARY
Glucose-Capillary: 191 mg/dL — ABNORMAL HIGH (ref 70–99)
Glucose-Capillary: 233 mg/dL — ABNORMAL HIGH (ref 70–99)
Glucose-Capillary: 264 mg/dL — ABNORMAL HIGH (ref 70–99)
Glucose-Capillary: 281 mg/dL — ABNORMAL HIGH (ref 70–99)

## 2021-06-02 MED ORDER — ERTAPENEM IV (FOR PTA / DISCHARGE USE ONLY): 1.0000 g | INTRAVENOUS | 0 refills | Status: AC

## 2021-06-02 MED ORDER — SODIUM CHLORIDE 0.9 % IV SOLN: INTRAVENOUS | Status: DC | PRN

## 2021-06-02 NOTE — Progress Notes (Signed)
PHARMACY NOTE -  Meropenem  Pharmacy has been assisting with dosing of meropenem for ESBL bacteremia. Dosage remains stable at 1g IV q8 hr and further renal adjustments per institutional Pharmacy antibiotic protocol  Pharmacy will sign off, following peripherally for culture results or dose adjustments. Please reconsult if a change in clinical status warrants re-evaluation of dosage.  Reuel Boom, PharmD, BCPS [phone] 06/02/2021, 11:19 AM

## 2021-06-02 NOTE — TOC Initial Note (Signed)
Transition of Care [facility]) - Initial/Assessment Note    Patient Details  Name: Cynthia [name] MRN: [id number] Date of Birth: [birth year]  [facility]) CM/SW Contact:    Lynnell Catalan, RN Phone Number: 06/02/2021, 11:22 AM  Clinical Narrative:                 Pt to dc with IV abx. Spoke with pt and daughter Cynthia [name] at bedside for dc planning. Cynthia [name] states that pt will be coming home with her at dc and she will be the primary caregiver. Choice was offered for [facility] and [facility] chosen. [place] liaison contacted for referral. Pam from [facility] was already alerted of need for IV abx by ID department. Pam will do teaching with [facility] this afternoon so everything will be ready for potential weekend dc.   Expected Discharge Plan: [place] Barriers to Discharge: Continued Medical Work up   Patient Goals and CMS Choice   CMS Medicare.gov Compare Post Acute Care list provided to:: Patient Choice offered to / list presented to : Patient  Expected Discharge Plan and Services Expected Discharge Plan: [place]   Discharge Planning Services: CM Consult Post Acute Care Choice: [place] arrangements for the past 2 months: Single Family Home                           HH Arranged: RN, IV Antibiotics HH Agency: [facility], [place] Date [place]: 06/02/21 Time [place]: 1119 Representative spoke with at [place]: Tommi Rumps and Jeannene Patella  Prior Living Arrangements/Services Living arrangements for the past 2 months: [place]   Patient language and need for interpreter reviewed:: Yes Do you feel safe going back to the place where you live?: Yes      Need for Family Participation in Patient Care: Yes (Comment) Care giver support system in place?: Yes (comment)   Criminal Activity/Legal Involvement Pertinent to Current Situation/Hospitalization: No - Comment as needed  Activities of Daily Living Home  Assistive Devices/Equipment: None ADL Screening (condition at time of admission) Patient's cognitive ability adequate to safely complete daily activities?: Yes Is the patient deaf or have difficulty hearing?: No Does the patient have difficulty seeing, even when wearing glasses/contacts?: No Does the patient have difficulty concentrating, remembering, or making decisions?: No Patient able to express need for assistance with ADLs?: Yes Does the patient have difficulty dressing or bathing?: No Independently performs ADLs?: Yes (appropriate for developmental age) Does the patient have difficulty walking or climbing stairs?: No Weakness of Legs: Both Weakness of Arms/Hands: None  Permission Sought/Granted Permission sought to share information with : Facility Art therapist granted to share information with : Yes, Verbal Permission Granted     Permission granted to share info w AGENCY: [facility] and [facility]        Emotional Assessment Appearance:: Appears stated age Attitude/Demeanor/Rapport: Gracious Affect (typically observed): Calm Orientation: : Oriented to Self, Oriented to Place, Oriented to  Time, Oriented to Situation Alcohol / Substance Use: Not Applicable Psych Involvement: No (comment)  Admission diagnosis:  Acute UTI [N39.0] Immunosuppressed status ([place]) [D84.9] Kidney transplant status [Z94.0] Malignant neoplasm of lower-inner quadrant of left breast in female, estrogen receptor positive ([place]) [C50.312, Z17.0] Patient Active Problem List   Diagnosis Date Noted   Acute UTI 05/28/2021   Stage 3a chronic kidney disease (CKD) ([place]) 05/28/2021   Thrombocytopenia ([place]) 05/28/2021   Aortic  atherosclerosis ([place]) 03/07/2021   Hypercholesterolemia 03/07/2021   BRCA2 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 03/01/2021   Genetic testing 03/01/2021   Family history of ovarian cancer 02/22/2021   Malignant neoplasm of lower-inner quadrant of left breast in female,  estrogen receptor positive ([place]) 02/15/2021   Foot pain, bilateral 07/28/2020   COVID-19 virus infection 06/16/2019   Insulin dependent type 2 diabetes mellitus ([place]) 06/06/2019   Hyperglycemia 06/02/2019   DKA (diabetic ketoacidoses) 06/02/2019   HTN (hypertension) 03/04/2019   Abnormal nuclear stress test 03/04/2019   Diabetic nephropathy ([place]) 03/03/2019   History of renal transplant 11/18/2015   Diabetic retinopathy associated with type 2 diabetes mellitus ([place]) 04/13/2015   Postprandial vomiting    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   Anemia in CKD (chronic kidney disease) 01/04/2015   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   Hyperkalemia 11/16/2013   Chronic constipation 07/28/2013   Paresthesias 07/28/2013   Diabetes mellitus type I [facility])    Kidney transplant status 05/28/2013   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/11/2013   Precordial chest pain 02/10/2013   Awaiting organ transplant 09/19/2012   PCP:  Arthur Holms, NP Pharmacy:   [id number] - [place], [address] AT [facility] [address] [place] [address] Phone: [phone] Fax: [phone]     Social Determinants of Health (SDOH) Interventions    Readmission Risk Interventions Readmission Risk Prevention Plan 06/02/2021  Transportation Screening Complete  PCP or Specialist Appt within 3-5 Days Complete  HRI or Home Care Consult Complete  Social Work Consult for [place] Planning/Counseling Complete  Palliative Care Screening Not Applicable  Medication Review Press photographer) Complete  Some recent data might be hidden

## 2021-06-02 NOTE — Progress Notes (Signed)
PROGRESS NOTE  Cynthia [name] WSF:[id number] DOB: 06-26-1970 DOA: 05/27/2021 PCP: Arthur Holms, NP   LOS: 5 days   Brief narrative: Cynthia [name] is a 50 y.o. female with past medical history of renal transplant, CKD stage IIIa, diabetes mellitus on insulin, breast cancer currently on chemotherapy presented to the hospital with 2 to 3 days history of dysuria and urinary urgency and frequency followed by fever and chills.  Patient was recently treated with docetaxel and cyclophosphamide on 05/24/2021 and subsequently developed urinary symptoms.  She then received  Udenyca on 05/26/21 with fever of 100.6 F.  In the ED, patient was afebrile but tachycardic.  EKG and chest x-ray was unremarkable except for creatinine of 1.2.  Lactic acid was normal.  COVID and influenza was negative.  Blood culture and urine culture was drawn from the ED and patient was given 500 mL of LR and started on IV Rocephin.  Patient was then admitted to the hospital for UTI.  Assessment/Plan:  Principal Problem:   Acute UTI Active Problems:   Diabetes mellitus type I ([place])   History of renal transplant   HTN (hypertension)   Malignant neoplasm of lower-inner quadrant of left breast in female, estrogen receptor positive (HCC)   Stage 3a chronic kidney disease (CKD) (HCC)   Thrombocytopenia (HCC)  ESBL E. coli bacteremia likely secondary to acute cystitis. On meropenem.  ID has been consulted for complicated UTI with renal transplant and ESBL UTI.  Patient is immune suppressed due to being on chemotherapy as outpatient and on immune suppressant.  Renal ultrasound did not show any hydronephrosis. Discussed with infectious disease and will likely need a 2 week course of carbapenems.  Home health is being arranged to set up outpatient antibiotics.  PICC line has been ordered.  Likely discharge after PICC line is placed.  Hx of renal transplant; AKI on CKD IIIa  Deceased donor kidney recipient in 2017.  Serum creatinine was  1.2 on admission  On tacrolimus mycophenolate and prednisone at home.  Creatinine did increase to 1.6, likely related to hypovolemia. Patient received IV fluids with improvement of creatinine back to baseline.  Spoke with Dr. Joelyn Oms nephrology who did not recommend adjusting dose of tacrolimus at this time.  Patient follows up with [facility] as outpatient.  Leukocytosis -Increase in WBC count from 6.7-32.4 -Does not appear septic or toxic -She has been afebrile and clinically appears to be doing well -Discussed with her oncologist, Dr. Burr Medico who confirms the patient did receive G-CSF on 01/7 -This could certainly be contributing to leukocytosis -Continue to monitor -We will need follow-up CBC after discharge  Breast cancer status postlumpectomy and chemotherapy  received docetaxel and cyclophosphamide on 05/24/21 and Udenyca on 05/26/21 . Patient follows up with oncology as outpatient.    Insulin-dependent DM type 1, uncontrolled with hyperglycemia Patient has hemoglobin A1c of 11.6 in May 2022.  Continue sliding scale insulin Accu-Cheks, long-acting insulin, diabetic diet.  Latest POC glucose of 237.  Patient reported that she was taking Lantus 40 units twice daily at home.    Thrombocytopenia  Overall platelet count increased to 190K today. No evidence of bleeding at this time.  Recent use of chemotherapy.   Mild hyponatremia. We will continue to monitor.  Asymptomatic.  Was on Lexapro at home.  Currently on hold.  Serum sodium stable  Hypertension.  Blood pressure currently stable on Coreg  Generalized body pain aches or spasm.  Neurontin at home.  Will resume.  Hold off with  NSAIDs for now.  Morbid Obesity -BMI 40.8 -counseled on importance of diet and exercise  DVT prophylaxis: SCDs Start: 05/28/21 6294   Code Status: Full code.  Family Communication:  Spoke with the patient's daughter at bedside.  Status is: Inpatient  Remains inpatient appropriate  because: Need for IV antibiotics, further monitoring, ID follow-up,    Consultants: ID  Oncology  Procedures: None  Anti-infectives:  Meropenem IV  Anti-infectives (From admission, onward)    Start     Dose/Rate Route Frequency Ordered Stop   06/02/21 0000  ertapenem [facility]) IVPB        1 g Intravenous Every 24 hours 06/02/21 1115 06/12/21 2359   06/01/21 1000  valACYclovir (VALTREX) tablet 1,000 mg        1,000 mg Oral 2 times daily 06/01/21 0907 06/11/21 0959   05/30/21 1400  meropenem (MERREM) 1 g in sodium chloride 0.9 % 100 mL IVPB        1 g 200 mL/hr over 30 Minutes Intravenous Every 8 hours 05/30/21 1207 06/11/21 2359   05/29/21 1600  meropenem (MERREM) 1 g in sodium chloride 0.9 % 100 mL IVPB  Status:  Discontinued        1 g 200 mL/hr over 30 Minutes Intravenous Every 12 hours 05/29/21 0802 05/30/21 1207   05/28/21 2000  meropenem (MERREM) 1 g in sodium chloride 0.9 % 100 mL IVPB  Status:  Discontinued        1 g 200 mL/hr over 30 Minutes Intravenous Every 8 hours 05/28/21 1819 05/29/21 0802   05/28/21 0115  cefTRIAXone (ROCEPHIN) 2 g in sodium chloride 0.9 % 100 mL IVPB  Status:  Discontinued        2 g 200 mL/hr over 30 Minutes Intravenous Every 24 hours 05/28/21 0100 05/28/21 1817      Subjective: Patient says she is feeling better today.  No shortness of breath or cough.  No diarrhea.  Objective: Vitals:   06/02/21 0530 06/02/21 1456  BP: (!) 173/59 (!) 150/58  Pulse: 78 78  Resp: 14 16  Temp: 98.5 F (36.9 C) 98.1 F (36.7 C)  SpO2: 92% 96%    Intake/Output Summary (Last 24 hours) at 06/02/2021 1911 Last data filed at 06/01/2021 1924 Gross per 24 hour  Intake 240 ml  Output --  Net 240 ml   Filed Weights   05/31/21 0500 06/01/21 0528 06/02/21 0530  Weight: 98 kg 98.1 kg 97.9 kg   Body mass index is 40.78 kg/m.   Physical Exam:  General exam: Alert, awake, oriented x 3 Respiratory system: Clear to auscultation. Respiratory effort  normal. Cardiovascular system:RRR. No murmurs, rubs, gallops. Gastrointestinal system: Abdomen is nondistended, soft and nontender. No organomegaly or masses felt. Normal bowel sounds heard. Central nervous system: Alert and oriented. No focal neurological deficits. Extremities: No C/C/E, +pedal pulses Skin: No rashes, lesions or ulcers Psychiatry: Judgement and insight appear normal. Mood & affect appropriate.    Data Review: I have personally reviewed the following laboratory data and studies,  CBC: Recent Labs  Lab 05/27/21 2259 05/29/21 0509 05/30/21 0536 05/30/21 1635 05/31/21 0554 06/01/21 0601 06/02/21 0716  WBC 9.0   < > 4.3 4.5 6.7 19.5* 32.4*  NEUTROABS 7.0  --  2.7 3.2  --   --  23.5*  HGB 11.8*   < > 11.7* 10.7* 10.1* 10.8* 10.1*  HCT 35.4*   < > 35.2* 32.8* 31.1* 33.2* 31.0*  MCV 90.3   < > 90.3 89.9  92.0 92.0 90.6  PLT 92*   < > 66* 88* 117* 157 190   < > = values in this interval not displayed.   Basic Metabolic Panel: Recent Labs  Lab 05/27/21 2259 05/29/21 0509 05/30/21 0536 05/31/21 0554 06/01/21 0601  NA 128* 126* 129* 129* 131*  K 4.9 5.0 5.1 4.7 4.7  CL 99 98 98 98 99  CO2 22 25 24 25 26   GLUCOSE 326* 228* 163* 200* 224*  BUN 36* 44* 37* 32* 29*  CREATININE 1.21* 1.61* 1.36* 1.28* 1.26*  CALCIUM 9.2 8.5* 9.4 9.1 9.3  MG  --   --  1.8  --   --   PHOS  --   --  2.1*  --  2.3*   Liver Function Tests: Recent Labs  Lab 05/27/21 2259 06/01/21 0601  AST 14*  --   ALT 26  --   ALKPHOS 114  --   BILITOT 0.7  --   PROT 6.0*  --   ALBUMIN 3.5 2.4*   No results for input(s): LIPASE, AMYLASE in the last 168 hours. No results for input(s): AMMONIA in the last 168 hours. Cardiac Enzymes: No results for input(s): CKTOTAL, CKMB, CKMBINDEX, TROPONINI in the last 168 hours. BNP (last 3 results) No results for input(s): BNP in the last 8760 hours.  ProBNP (last 3 results) No results for input(s): PROBNP in the last 8760 hours.  CBG: Recent Labs   Lab 06/01/21 1806 06/01/21 2105 06/02/21 0746 06/02/21 1202 06/02/21 1703  GLUCAP 237* 328* 191* 233* 264*   Recent Results (from the past 240 hour(s))  Resp Panel by RT-PCR (Flu A&B, Covid) Nasopharyngeal Swab     Status: None   Collection Time: 05/27/21 11:52 PM   Specimen: Nasopharyngeal Swab; Nasopharyngeal(NP) swabs in vial transport medium  Result Value Ref Range Status   SARS Coronavirus 2 by RT PCR NEGATIVE NEGATIVE Final    Comment: (NOTE) SARS-CoV-2 target nucleic acids are NOT DETECTED.  The SARS-CoV-2 RNA is generally detectable in upper respiratory specimens during the acute phase of infection. The lowest concentration of SARS-CoV-2 viral copies this assay can detect is 138 copies/mL. A negative result does not preclude SARS-Cov-2 infection and should not be used as the sole basis for treatment or other patient management decisions. A negative result may occur with  improper specimen collection/handling, submission of specimen other than nasopharyngeal swab, presence of viral mutation(s) within the areas targeted by this assay, and inadequate number of viral copies(<138 copies/mL). A negative result must be combined with clinical observations, patient history, and epidemiological information. The expected result is Negative.  Fact Sheet for Patients:  EntrepreneurPulse.com.au  Fact Sheet for Healthcare Providers:  IncredibleEmployment.be  This test is no t yet approved or cleared by the [place] FDA and  has been authorized for detection and/or diagnosis of SARS-CoV-2 by FDA under an Emergency Use Authorization (EUA). This EUA will remain  in effect (meaning this test can be used) for the duration of the COVID-19 declaration under Section 564(b)(1) of the Act, 21 U.S.C.section 360bbb-3(b)(1), unless the authorization is terminated  or revoked sooner.       Influenza A by PCR NEGATIVE NEGATIVE Final   Influenza B by  PCR NEGATIVE NEGATIVE Final    Comment: (NOTE) The Xpert Xpress SARS-CoV-2/FLU/RSV plus assay is intended as an aid in the diagnosis of influenza from Nasopharyngeal swab specimens and should not be used as a sole basis for treatment. Nasal washings and aspirates are unacceptable  for Xpert Xpress SARS-CoV-2/FLU/RSV testing.  Fact Sheet for Patients: EntrepreneurPulse.com.au  Fact Sheet for Healthcare Providers: IncredibleEmployment.be  This test is not yet approved or cleared by the [place] FDA and has been authorized for detection and/or diagnosis of SARS-CoV-2 by FDA under an Emergency Use Authorization (EUA). This EUA will remain in effect (meaning this test can be used) for the duration of the COVID-19 declaration under Section 564(b)(1) of the Act, 21 U.S.C. section 360bbb-3(b)(1), unless the authorization is terminated or revoked.  Performed at [facility], [place] [address], [place], [place] [address]   Urine Culture     Status: Abnormal   Collection Time: 05/28/21 12:17 AM   Specimen: Urine, Clean Catch  Result Value Ref Range Status   Specimen Description   Final    URINE, CLEAN CATCH Performed at [facility], [place] [address] [place], [place] [address]    Special Requests   Final    NONE Performed at [facility], [place] [address], [place], [place] [address]    Culture (A)  Final    >=100,000 COLONIES/mL ESCHERICHIA COLI Confirmed Extended Spectrum Beta-Lactamase Producer (ESBL).  In bloodstream infections from ESBL organisms, carbapenems are preferred over piperacillin/tazobactam. They are shown to have a lower risk of mortality.    Report Status 05/30/2021 FINAL  Final   Organism ID, Bacteria ESCHERICHIA COLI (A)  Final      Susceptibility   Escherichia coli - MIC*    AMPICILLIN >=32 RESISTANT Resistant     CEFAZOLIN >=64 RESISTANT Resistant     CEFEPIME 16  RESISTANT Resistant     CEFTRIAXONE >=64 RESISTANT Resistant     CIPROFLOXACIN >=4 RESISTANT Resistant     GENTAMICIN >=16 RESISTANT Resistant     IMIPENEM <=0.25 SENSITIVE Sensitive     NITROFURANTOIN <=16 SENSITIVE Sensitive     TRIMETH/SULFA <=20 SENSITIVE Sensitive     AMPICILLIN/SULBACTAM 4 SENSITIVE Sensitive     PIP/TAZO <=4 SENSITIVE Sensitive     * >=100,000 COLONIES/mL ESCHERICHIA COLI  Culture, blood (routine x 2)     Status: Abnormal   Collection Time: 05/28/21 12:23 AM   Specimen: BLOOD  Result Value Ref Range Status   Specimen Description   Final    BLOOD RIGHT ANTECUBITAL Performed at [facility] [address] [place], [place] [address]    Special Requests   Final    BOTTLES DRAWN AEROBIC AND ANAEROBIC Blood Culture results may not be optimal due to an excessive volume of blood received in culture bottles Performed at [facility] [address], [place], [place] [address]    Culture  Setup Time   Final    GRAM NEGATIVE RODS IN BOTH AEROBIC AND ANAEROBIC BOTTLES CRITICAL RESULT CALLED TO, READ BACK BY AND VERIFIED WITH: PHARM D J.LEGGE ON [id number] AT 1804 BY E.PARRISH Performed at [facility], [place] [address], [place], [place] [address]    Culture (A)  Final    ESCHERICHIA COLI Confirmed Extended Spectrum Beta-Lactamase Producer (ESBL).  In bloodstream infections from ESBL organisms, carbapenems are preferred over piperacillin/tazobactam. They are shown to have a lower risk of mortality.    Report Status 05/30/2021 FINAL  Final   Organism ID, Bacteria ESCHERICHIA COLI  Final      Susceptibility   Escherichia coli - MIC*    AMPICILLIN >=32 RESISTANT Resistant     CEFAZOLIN >=64 RESISTANT Resistant     CEFEPIME 16 RESISTANT Resistant  CEFTAZIDIME RESISTANT Resistant     CEFTRIAXONE >=64 RESISTANT Resistant     CIPROFLOXACIN >=4 RESISTANT Resistant     GENTAMICIN >=16 RESISTANT Resistant     IMIPENEM <=0.25  SENSITIVE Sensitive     TRIMETH/SULFA <=20 SENSITIVE Sensitive     AMPICILLIN/SULBACTAM 8 SENSITIVE Sensitive     PIP/TAZO <=4 SENSITIVE Sensitive     * ESCHERICHIA COLI  Blood Culture ID Panel (Reflexed)     Status: Abnormal   Collection Time: 05/28/21 12:23 AM  Result Value Ref Range Status   Enterococcus faecalis NOT DETECTED NOT DETECTED Final   Enterococcus Faecium NOT DETECTED NOT DETECTED Final   Listeria monocytogenes NOT DETECTED NOT DETECTED Final   Staphylococcus species NOT DETECTED NOT DETECTED Final   Staphylococcus aureus (BCID) NOT DETECTED NOT DETECTED Final   Staphylococcus epidermidis NOT DETECTED NOT DETECTED Final   Staphylococcus lugdunensis NOT DETECTED NOT DETECTED Final   Streptococcus species NOT DETECTED NOT DETECTED Final   Streptococcus agalactiae NOT DETECTED NOT DETECTED Final   Streptococcus pneumoniae NOT DETECTED NOT DETECTED Final   Streptococcus pyogenes NOT DETECTED NOT DETECTED Final   A.calcoaceticus-baumannii NOT DETECTED NOT DETECTED Final   Bacteroides fragilis NOT DETECTED NOT DETECTED Final   Enterobacterales DETECTED (A) NOT DETECTED Final    Comment: Enterobacterales represent a large order of gram negative bacteria, not a single organism. CRITICAL RESULT CALLED TO, READ BACK BY AND VERIFIED WITH: PHARM D J.LEGGE ON [id number] AT 1804 BY E.PARRISH    Enterobacter cloacae complex NOT DETECTED NOT DETECTED Final   Escherichia coli DETECTED (A) NOT DETECTED Final    Comment: CRITICAL RESULT CALLED TO, READ BACK BY AND VERIFIED WITH: PHARM D J.LEGGE ON [id number] AT 1804 BY E.PARRISH    Klebsiella aerogenes NOT DETECTED NOT DETECTED Final   Klebsiella oxytoca NOT DETECTED NOT DETECTED Final   Klebsiella pneumoniae NOT DETECTED NOT DETECTED Final   Proteus species NOT DETECTED NOT DETECTED Final   Salmonella species NOT DETECTED NOT DETECTED Final   Serratia marcescens NOT DETECTED NOT DETECTED Final   Haemophilus influenzae NOT DETECTED NOT  DETECTED Final   Neisseria meningitidis NOT DETECTED NOT DETECTED Final   Pseudomonas aeruginosa NOT DETECTED NOT DETECTED Final   Stenotrophomonas maltophilia NOT DETECTED NOT DETECTED Final   Candida albicans NOT DETECTED NOT DETECTED Final   Candida auris NOT DETECTED NOT DETECTED Final   Candida glabrata NOT DETECTED NOT DETECTED Final   Candida krusei NOT DETECTED NOT DETECTED Final   Candida parapsilosis NOT DETECTED NOT DETECTED Final   Candida tropicalis NOT DETECTED NOT DETECTED Final   Cryptococcus neoformans/gattii NOT DETECTED NOT DETECTED Final   CTX-M ESBL DETECTED (A) NOT DETECTED Final    Comment: CRITICAL RESULT CALLED TO, READ BACK BY AND VERIFIED WITH: PHARM D J.LEGGE ON [id number] AT 1804 BY E.PARRISH (NOTE) Extended spectrum beta-lactamase detected. Recommend a carbapenem as initial therapy.      Carbapenem resistance IMP NOT DETECTED NOT DETECTED Final   Carbapenem resistance KPC NOT DETECTED NOT DETECTED Final   Carbapenem resistance NDM NOT DETECTED NOT DETECTED Final   Carbapenem resist OXA 48 LIKE NOT DETECTED NOT DETECTED Final   Carbapenem resistance VIM NOT DETECTED NOT DETECTED Final    Comment: Performed at [facility], [place] [address], [place], [place] [address]  Culture, blood (routine x 2)     Status: Abnormal   Collection Time: 05/28/21 12:28 AM   Specimen: BLOOD  Result Value Ref Range Status   Specimen Description  Final    BLOOD BLOOD RIGHT HAND Performed at [facility] [address], [place], [place] [address]    Special Requests   Final    BOTTLES DRAWN AEROBIC AND ANAEROBIC Blood Culture adequate volume Performed at [facility] [address] [place], [place] [address]    Culture  Setup Time   Final    GRAM NEGATIVE RODS IN BOTH AEROBIC AND ANAEROBIC BOTTLES CRITICAL VALUE NOTED.  VALUE IS CONSISTENT WITH PREVIOUSLY REPORTED AND CALLED VALUE.    Culture (A)  Final    ESCHERICHIA  COLI SUSCEPTIBILITIES PERFORMED ON PREVIOUS CULTURE WITHIN THE LAST 5 DAYS. Performed at [facility], [place] [address] [place], [place] [address]    Report Status 05/30/2021 FINAL  Final  Culture, blood (routine x 2)     Status: None (Preliminary result)   Collection Time: 05/30/21  2:23 PM   Specimen: BLOOD RIGHT FOREARM  Result Value Ref Range Status   Specimen Description   Final    BLOOD RIGHT FOREARM Performed at [facility] [address] [place], [place] [address]    Special Requests   Final    BOTTLES DRAWN AEROBIC ONLY Blood Culture results may not be optimal due to an inadequate volume of blood received in culture bottles Performed at [facility] [address] [place], [place] [address]    Culture   Final    NO GROWTH 3 DAYS Performed at [facility], [place] [address], [place], [place] [address]    Report Status PENDING  Incomplete  Culture, blood (routine x 2)     Status: None (Preliminary result)   Collection Time: 05/30/21  2:23 PM   Specimen: BLOOD RIGHT WRIST  Result Value Ref Range Status   Specimen Description   Final    BLOOD RIGHT WRIST Performed at [facility] [address], [place], [place] [address]    Special Requests   Final    BOTTLES DRAWN AEROBIC ONLY Blood Culture results may not be optimal due to an inadequate volume of blood received in culture bottles Performed at [facility] [address], [place], [place] [address]    Culture   Final    NO GROWTH 3 DAYS Performed at [facility], [place] [address] [place],  [address]    Report Status PENDING  Incomplete     Studies: [place] EKG SITE RITE  Result Date: 06/01/2021 If Site Rite image not attached, placement could not be confirmed due to current cardiac rhythm.     Kathie Dike, MD  [facility] 06/02/2021  If 7PM-7AM, please contact night-coverage

## 2021-06-02 NOTE — Progress Notes (Addendum)
[facility] Infectious Diseases Follow Up Note  Patient Identification: Patient Name: Cynthia [name] MRN: [id number] [place] Date: 05/27/2021 10:27 PM Age: 50 y.o.Today's Date: 06/02/2021   Reason for Visit: Pyelonephritis  Principal Problem:   Acute UTI Active Problems:   Diabetes mellitus type I ([place])   History of renal transplant   HTN (hypertension)   Malignant neoplasm of lower-inner quadrant of left breast in female, estrogen receptor positive (HCC)   Stage 3a chronic kidney disease (CKD) (HCC)   Thrombocytopenia (HCC)  Antibiotics: Ceftriaxone 12/3                    Meropenem 12/4-current    Lines/Hardware: Left arm AV fistula   Interval Events: Afebrile for more than 48 hours, WBC went up to 32.4 .  Repeat blood culture 12/6 no growth in 3 days   Assessment # Leukocytosis: Afebrile for more than 48 hours. No other complaints except painful ulcers in the lateral side of tongue. Clinically better. S/p G-CSF on 12/2 per notes  # E. coli bacteremia, ESBL secondary to complicated UTI - US kidneys unremarkable for acute changes   # Breast cancer status post lumpectomy and undergoing active chemotherapy # DD KT in 2017 on immunosuppression(tacrolimus, prednisone and mycophenolate) with CKD # DM, uncontrolled-last A1c 11.9   Recommendations Continue meropenem ( or ertapenem) as is, plan for 14 days total.End date 12/18. Will avoid PO bactrim in the setting of CKD  OK to place IV access Complete 10 days course of valacyclovi Monitor CBC to make sure it stabilizes/downtrends. Suspect up-trending WBC count related to [place] on 12/2 BG control  Will follow up in the clinic ID will sign off. Please call with questions   Rest of the management as per the primary team. Thank you for the consult.  ______________________________________________________________________ Subjective patient seen and examined at the bedside.   Doing well, except ulcers in the lateral side of tongue Vitals BP (!) 173/59 (BP Location: Left Arm)   Pulse 78   Temp 98.5 F (36.9 C) (Oral)   Resp 14   Ht 5\' 1"  (1.549 m)   Wt 97.9 kg   SpO2 92%   BMI 40.78 kg/m     Physical Exam Constitutional: Lying in bed, not in acute distress    comments:      Cardiovascular:     Rate and Rhythm: Normal rate and regular rhythm.     Heart sounds:   Pulmonary:     Effort: Pulmonary effort is normal.     Comments:   Abdominal:     Palpations: Abdomen is soft.     Tenderness: Nontender  Musculoskeletal:        General: No swelling or tenderness.   Skin:    Comments: No lesions or rashes.  No phlebitis on skin exam  Neurological:     General: No focal deficit present.  Awake alert and oriented  Psychiatric:        Mood and Affect: Mood normal.  Calm and cooperative  Pertinent Microbiology Results for orders placed or performed during the hospital encounter of 05/27/21  Resp Panel by RT-PCR (Flu A&B, Covid) Nasopharyngeal Swab     Status: None   Collection Time: 05/27/21 11:52 PM   Specimen: Nasopharyngeal Swab; Nasopharyngeal(NP) swabs in vial transport medium  Result Value Ref Range Status   SARS Coronavirus 2 by RT PCR NEGATIVE NEGATIVE Final    Comment: (NOTE) SARS-CoV-2 target nucleic acids are NOT DETECTED.  The SARS-CoV-2 RNA  is generally detectable in upper respiratory specimens during the acute phase of infection. The lowest concentration of SARS-CoV-2 viral copies this assay can detect is 138 copies/mL. A negative result does not preclude SARS-Cov-2 infection and should not be used as the sole basis for treatment or other patient management decisions. A negative result may occur with  improper specimen collection/handling, submission of specimen other than nasopharyngeal swab, presence of viral mutation(s) within the areas targeted by this assay, and inadequate number of viral copies(<138 copies/mL). A  negative result must be combined with clinical observations, patient history, and epidemiological information. The expected result is Negative.  Fact Sheet for Patients:  EntrepreneurPulse.com.au  Fact Sheet for Healthcare Providers:  IncredibleEmployment.be  This test is no t yet approved or cleared by the [place] FDA and  has been authorized for detection and/or diagnosis of SARS-CoV-2 by FDA under an Emergency Use Authorization (EUA). This EUA will remain  in effect (meaning this test can be used) for the duration of the COVID-19 declaration under Section 564(b)(1) of the Act, 21 U.S.C.section 360bbb-3(b)(1), unless the authorization is terminated  or revoked sooner.       Influenza A by PCR NEGATIVE NEGATIVE Final   Influenza B by PCR NEGATIVE NEGATIVE Final    Comment: (NOTE) The Xpert Xpress SARS-CoV-2/FLU/RSV plus assay is intended as an aid in the diagnosis of influenza from Nasopharyngeal swab specimens and should not be used as a sole basis for treatment. Nasal washings and aspirates are unacceptable for Xpert Xpress SARS-CoV-2/FLU/RSV testing.  Fact Sheet for Patients: EntrepreneurPulse.com.au  Fact Sheet for Healthcare Providers: IncredibleEmployment.be  This test is not yet approved or cleared by the [place] FDA and has been authorized for detection and/or diagnosis of SARS-CoV-2 by FDA under an Emergency Use Authorization (EUA). This EUA will remain in effect (meaning this test can be used) for the duration of the COVID-19 declaration under Section 564(b)(1) of the Act, 21 U.S.C. section 360bbb-3(b)(1), unless the authorization is terminated or revoked.  Performed at [facility], [place] [address], [place], [place] [address]   Urine Culture     Status: Abnormal   Collection Time: 05/28/21 12:17 AM   Specimen: Urine, Clean Catch  Result Value Ref Range  Status   Specimen Description   Final    URINE, CLEAN CATCH Performed at [facility], [place] [address], [place], [place] [address]    Special Requests   Final    NONE Performed at [facility], [place] [address], [place], [place] [address]    Culture (A)  Final    >=100,000 COLONIES/mL ESCHERICHIA COLI Confirmed Extended Spectrum Beta-Lactamase Producer (ESBL).  In bloodstream infections from ESBL organisms, carbapenems are preferred over piperacillin/tazobactam. They are shown to have a lower risk of mortality.    Report Status 05/30/2021 FINAL  Final   Organism ID, Bacteria ESCHERICHIA COLI (A)  Final      Susceptibility   Escherichia coli - MIC*    AMPICILLIN >=32 RESISTANT Resistant     CEFAZOLIN >=64 RESISTANT Resistant     CEFEPIME 16 RESISTANT Resistant     CEFTRIAXONE >=64 RESISTANT Resistant     CIPROFLOXACIN >=4 RESISTANT Resistant     GENTAMICIN >=16 RESISTANT Resistant     IMIPENEM <=0.25 SENSITIVE Sensitive     NITROFURANTOIN <=16 SENSITIVE Sensitive     TRIMETH/SULFA <=20 SENSITIVE Sensitive     AMPICILLIN/SULBACTAM 4 SENSITIVE Sensitive     PIP/TAZO <=4 SENSITIVE Sensitive     * >=  100,000 COLONIES/mL ESCHERICHIA COLI  Culture, blood (routine x 2)     Status: Abnormal   Collection Time: 05/28/21 12:23 AM   Specimen: BLOOD  Result Value Ref Range Status   Specimen Description   Final    BLOOD RIGHT ANTECUBITAL Performed at [place] [address] [place], [place] [address]    Special Requests   Final    BOTTLES DRAWN AEROBIC AND ANAEROBIC Blood Culture results may not be optimal due to an excessive volume of blood received in culture bottles Performed at [place] [address], [place], [place] [address]    Culture  Setup Time   Final    GRAM NEGATIVE RODS IN BOTH AEROBIC AND ANAEROBIC BOTTLES CRITICAL RESULT CALLED TO, READ BACK BY AND VERIFIED WITH: PHARM D J.LEGGE ON  [id number] AT 1804 BY E.PARRISH Performed at [facility], [place] [address] [place], [place] [address]    Culture (A)  Final    ESCHERICHIA COLI Confirmed Extended Spectrum Beta-Lactamase Producer (ESBL).  In bloodstream infections from ESBL organisms, carbapenems are preferred over piperacillin/tazobactam. They are shown to have a lower risk of mortality.    Report Status 05/30/2021 FINAL  Final   Organism ID, Bacteria ESCHERICHIA COLI  Final      Susceptibility   Escherichia coli - MIC*    AMPICILLIN >=32 RESISTANT Resistant     CEFAZOLIN >=64 RESISTANT Resistant     CEFEPIME 16 RESISTANT Resistant     CEFTAZIDIME RESISTANT Resistant     CEFTRIAXONE >=64 RESISTANT Resistant     CIPROFLOXACIN >=4 RESISTANT Resistant     GENTAMICIN >=16 RESISTANT Resistant     IMIPENEM <=0.25 SENSITIVE Sensitive     TRIMETH/SULFA <=20 SENSITIVE Sensitive     AMPICILLIN/SULBACTAM 8 SENSITIVE Sensitive     PIP/TAZO <=4 SENSITIVE Sensitive     * ESCHERICHIA COLI  Blood Culture ID Panel (Reflexed)     Status: Abnormal   Collection Time: 05/28/21 12:23 AM  Result Value Ref Range Status   Enterococcus faecalis NOT DETECTED NOT DETECTED Final   Enterococcus Faecium NOT DETECTED NOT DETECTED Final   Listeria monocytogenes NOT DETECTED NOT DETECTED Final   Staphylococcus species NOT DETECTED NOT DETECTED Final   Staphylococcus aureus (BCID) NOT DETECTED NOT DETECTED Final   Staphylococcus epidermidis NOT DETECTED NOT DETECTED Final   Staphylococcus lugdunensis NOT DETECTED NOT DETECTED Final   Streptococcus species NOT DETECTED NOT DETECTED Final   Streptococcus agalactiae NOT DETECTED NOT DETECTED Final   Streptococcus pneumoniae NOT DETECTED NOT DETECTED Final   Streptococcus pyogenes NOT DETECTED NOT DETECTED Final   A.calcoaceticus-baumannii NOT DETECTED NOT DETECTED Final   Bacteroides fragilis NOT DETECTED NOT DETECTED Final   Enterobacterales DETECTED (A) NOT DETECTED Final    Comment:  Enterobacterales represent a large order of gram negative bacteria, not a single organism. CRITICAL RESULT CALLED TO, READ BACK BY AND VERIFIED WITH: PHARM D J.LEGGE ON [id number] AT 1804 BY E.PARRISH    Enterobacter cloacae complex NOT DETECTED NOT DETECTED Final   Escherichia coli DETECTED (A) NOT DETECTED Final    Comment: CRITICAL RESULT CALLED TO, READ BACK BY AND VERIFIED WITH: PHARM D J.LEGGE ON [id number] AT 1804 BY E.PARRISH    Klebsiella aerogenes NOT DETECTED NOT DETECTED Final   Klebsiella oxytoca NOT DETECTED NOT DETECTED Final   Klebsiella pneumoniae NOT DETECTED NOT DETECTED Final   Proteus species NOT DETECTED NOT DETECTED Final   Salmonella species NOT DETECTED NOT DETECTED Final  Serratia marcescens NOT DETECTED NOT DETECTED Final   Haemophilus influenzae NOT DETECTED NOT DETECTED Final   Neisseria meningitidis NOT DETECTED NOT DETECTED Final   Pseudomonas aeruginosa NOT DETECTED NOT DETECTED Final   Stenotrophomonas maltophilia NOT DETECTED NOT DETECTED Final   Candida albicans NOT DETECTED NOT DETECTED Final   Candida auris NOT DETECTED NOT DETECTED Final   Candida glabrata NOT DETECTED NOT DETECTED Final   Candida krusei NOT DETECTED NOT DETECTED Final   Candida parapsilosis NOT DETECTED NOT DETECTED Final   Candida tropicalis NOT DETECTED NOT DETECTED Final   Cryptococcus neoformans/gattii NOT DETECTED NOT DETECTED Final   CTX-M ESBL DETECTED (A) NOT DETECTED Final    Comment: CRITICAL RESULT CALLED TO, READ BACK BY AND VERIFIED WITH: PHARM D J.LEGGE ON [id number] AT 1804 BY E.PARRISH (NOTE) Extended spectrum beta-lactamase detected. Recommend a carbapenem as initial therapy.      Carbapenem resistance IMP NOT DETECTED NOT DETECTED Final   Carbapenem resistance KPC NOT DETECTED NOT DETECTED Final   Carbapenem resistance NDM NOT DETECTED NOT DETECTED Final   Carbapenem resist OXA 48 LIKE NOT DETECTED NOT DETECTED Final   Carbapenem resistance VIM NOT DETECTED  NOT DETECTED Final    Comment: Performed at [facility], [place] [address] [place], [place] [address]  Culture, blood (routine x 2)     Status: Abnormal   Collection Time: 05/28/21 12:28 AM   Specimen: BLOOD  Result Value Ref Range Status   Specimen Description   Final    BLOOD BLOOD RIGHT HAND Performed at [facility] [address], [place], [place] [address]    Special Requests   Final    BOTTLES DRAWN AEROBIC AND ANAEROBIC Blood Culture adequate volume Performed at [place] [address] [place], [place] [address]    Culture  Setup Time   Final    GRAM NEGATIVE RODS IN BOTH AEROBIC AND ANAEROBIC BOTTLES CRITICAL VALUE NOTED.  VALUE IS CONSISTENT WITH PREVIOUSLY REPORTED AND CALLED VALUE.    Culture (A)  Final    ESCHERICHIA COLI SUSCEPTIBILITIES PERFORMED ON PREVIOUS CULTURE WITHIN THE LAST 5 DAYS. Performed at [facility], [place] [address], [place], [place] [address]    Report Status 05/30/2021 FINAL  Final  Culture, blood (routine x 2)     Status: None (Preliminary result)   Collection Time: 05/30/21  2:23 PM   Specimen: BLOOD RIGHT FOREARM  Result Value Ref Range Status   Specimen Description   Final    BLOOD RIGHT FOREARM Performed at [place] [address] [place], [place] [address]    Special Requests   Final    BOTTLES DRAWN AEROBIC ONLY Blood Culture results may not be optimal due to an inadequate volume of blood received in culture bottles Performed at [facility] [address], [place], [place] [address]    Culture   Final    NO GROWTH 3 DAYS Performed at [facility], [place] [address] [place], [place] [address]    Report Status PENDING  Incomplete  Culture, blood (routine x 2)     Status: None (Preliminary result)   Collection Time: 05/30/21  2:23 PM   Specimen: BLOOD RIGHT WRIST  Result Value Ref Range Status   Specimen Description   Final     BLOOD RIGHT WRIST Performed at [facility] [address] [place],  [address]    Special Requests   Final  BOTTLES DRAWN AEROBIC ONLY Blood Culture results may not be optimal due to an inadequate volume of blood received in culture bottles Performed at [facility], [place] [address], [place], [place] [address]    Culture   Final    NO GROWTH 3 DAYS Performed at [facility], [place] [address], [place], [place] [address]    Report Status PENDING  Incomplete     Pertinent Lab. CBC Latest Ref Rng & Units 06/02/2021 06/01/2021 05/31/2021  WBC 4.0 - 10.5 K/uL 32.4(H) 19.5(H) 6.7  Hemoglobin 12.0 - 15.0 g/dL 10.1(L) 10.8(L) 10.1(L)  Hematocrit 36.0 - 46.0 % 31.0(L) 33.2(L) 31.1(L)  Platelets 150 - 400 K/uL 190 157 117(L)   CMP Latest Ref Rng & Units 06/01/2021 05/31/2021 05/30/2021  Glucose 70 - 99 mg/dL 224(H) 200(H) 163(H)  BUN 6 - 20 mg/dL 29(H) 32(H) 37(H)  Creatinine 0.44 - 1.00 mg/dL 1.26(H) 1.28(H) 1.36(H)  Sodium 135 - 145 mmol/L 131(L) 129(L) 129(L)  Potassium 3.5 - 5.1 mmol/L 4.7 4.7 5.1  Chloride 98 - 111 mmol/L 99 98 98  CO2 22 - 32 mmol/L 26 25 24   Calcium 8.9 - 10.3 mg/dL 9.3 9.1 9.4  Total Protein 6.5 - 8.1 g/dL - - -  Total Bilirubin 0.3 - 1.2 mg/dL - - -  Alkaline Phos 38 - 126 U/L - - -  AST 15 - 41 U/L - - -  ALT 0 - 44 U/L - - -     Pertinent Imaging today Plain films and CT images have been personally visualized and interpreted; radiology reports have been reviewed. Decision making incorporated into the Impression / Recommendations. US renal 12/6  FINDINGS: Right Kidney:   Renal measurements: 8.4 x 4.4 x 3.8 cm = volume: 73 mL. Difficult to visualize. Increased renal echogenicity. No hydronephrosis.   Left Kidney:   Not visualized.   Bladder:   Appears normal for degree of bladder distention.   Other:   Transplanted kidney not evaluated.   IMPRESSION: 1. Native right kidney is small and echogenic  likely related to medical renal disease. 2. Native left kidney not visualized.  I spent more than 40 minutes for this patient encounter including review of prior medical records, coordination of care  with greater than 50% of time being face to face/counseling and discussing diagnostics/treatment plan with the patient/family.  Electronically signed by:   Rosiland Oz, MD Infectious Disease Physician [facility] Pager: [phone]

## 2021-06-03 DIAGNOSIS — E1022 Type 1 diabetes mellitus with diabetic chronic kidney disease: Secondary | ICD-10-CM | POA: Diagnosis not present

## 2021-06-03 DIAGNOSIS — R7881 Bacteremia: Secondary | ICD-10-CM | POA: Diagnosis not present

## 2021-06-03 DIAGNOSIS — N390 Urinary tract infection, site not specified: Secondary | ICD-10-CM | POA: Diagnosis not present

## 2021-06-03 DIAGNOSIS — I10 Essential (primary) hypertension: Secondary | ICD-10-CM | POA: Diagnosis not present

## 2021-06-03 LAB — GLUCOSE, CAPILLARY
Glucose-Capillary: 104 mg/dL — ABNORMAL HIGH (ref 70–99)
Glucose-Capillary: 178 mg/dL — ABNORMAL HIGH (ref 70–99)
Glucose-Capillary: 172 mg/dL — ABNORMAL HIGH (ref 70–99)

## 2021-06-03 MED ORDER — VALACYCLOVIR HCL 1 G PO TABS: 1000.0000 mg | ORAL_TABLET | Freq: Two times a day (BID) | ORAL | 0 refills | Status: DC

## 2021-06-03 MED ORDER — LANTUS SOLOSTAR 100 UNIT/ML ~~LOC~~ SOPN: 25.0000 [IU] | PEN_INJECTOR | Freq: Two times a day (BID) | SUBCUTANEOUS | 99 refills | Status: DC

## 2021-06-03 NOTE — Progress Notes (Signed)
Spoke with Caren Griffins RN and Dr Roderic Palau on phone re PICC order.   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  No Nephrology consulted- per Dr Roderic Palau has followed in periphery.  Also concern re hx breast cancer and question any lymph node involvement.  Dr Roderic Palau to confirm concerns before PICC placement. Also the possibility of midline for ertapenem vs PICC line- both to be approved by nephrology.

## 2021-06-03 NOTE — Discharge Summary (Signed)
Physician Discharge Summary  Cynthia [name] VQQ:[id number] DOB: Dec 21, 1970 DOA: 05/27/2021  PCP: Arthur Holms, NP  Admit date: 05/27/2021 Discharge date: 06/03/2021  Admitted From: home Disposition:  home  Recommendations for Outpatient Follow-up:  Follow up with PCP in 1-2 weeks Please obtain BMP/CBC in one week Follow up with oncology next week for repeat CBC  Home [place] RN Equipment/Devices:midline IV and IV antibiotics  Discharge Condition:stable CODE STATUS:full code Diet recommendation: heart healthy/carb mod  Brief/Interim Summary: Cynthia [name] is a 50 y.o. female with past medical history of renal transplant, CKD stage IIIa, diabetes mellitus on insulin, breast cancer currently on chemotherapy presented to the hospital with 2 to 3 days history of dysuria and urinary urgency and frequency followed by fever and chills.  Patient was recently treated with docetaxel and cyclophosphamide on 05/24/2021 and subsequently developed urinary symptoms.  She then received  Udenyca on 05/26/21 with fever of 100.6 F.  In the ED, patient was afebrile but tachycardic.  EKG and chest x-ray was unremarkable except for creatinine of 1.2.  Lactic acid was normal.  COVID and influenza was negative.  Blood culture and urine culture was drawn from the ED and patient was given 500 mL of LR and started on IV Rocephin.  Patient was then admitted to the hospital for UTI.  Discharge Diagnoses:  Principal Problem:   Acute UTI Active Problems:   Diabetes mellitus type I ([place])   History of renal transplant   HTN (hypertension)   Malignant neoplasm of lower-inner quadrant of left breast in female, estrogen receptor positive (HCC)   Stage 3a chronic kidney disease (CKD) (HCC)   Thrombocytopenia (HCC)  ESBL E. coli bacteremia likely secondary to acute cystitis. On meropenem.  ID has been consulted for complicated UTI with renal transplant and ESBL UTI.  Patient is immune suppressed due to being on  chemotherapy as outpatient and on immune suppressant.  Renal ultrasound did not show any hydronephrosis. Discussed with infectious disease and will likely need a 2 week course of carbapenems.  Home health is being arranged to set up outpatient antibiotics (Ertapenem until 12/18).  Patient had midline placed prior to discharge for antibiotics   Hx of renal transplant; AKI on CKD IIIa  Deceased donor kidney recipient in 2017.  Serum creatinine was 1.2 on admission  On tacrolimus mycophenolate and prednisone at home.  Creatinine did increase to 1.6, likely related to hypovolemia. Patient received IV fluids with improvement of creatinine back to baseline.  Spoke with Dr. Joelyn Oms nephrology who did not recommend adjusting dose of tacrolimus at this time.  Patient follows up with [facility] as outpatient.   Leukocytosis -Increase in WBC count from 6.7-32.4 -Does not appear septic or toxic -She has been afebrile and clinically appears to be doing well -Discussed with her oncologist, Dr. Burr Medico who confirms the patient did receive G-CSF on 43/3 -This could certainly be contributing to leukocytosis -Continue to monitor -We will need follow-up CBC after discharge   Breast cancer status postlumpectomy and chemotherapy  received docetaxel and cyclophosphamide on 05/24/21 and Udenyca on 05/26/21 . Patient follows up with oncology as outpatient.    Insulin-dependent DM type 1, uncontrolled with hyperglycemia Patient has hemoglobin A1c of 11.6 in May 2022.  Continue sliding scale insulin Accu-Cheks, long-acting insulin, diabetic diet.  resume home regimen on discharge    Thrombocytopenia  Overall platelet count increased to 190K today. No evidence of bleeding at this time.  Recent use of chemotherapy.   Mild hyponatremia. We  will continue to monitor.  Asymptomatic.  Was on Lexapro at home.  Currently on hold.  Serum sodium stable   Hypertension.  Blood pressure currently stable on Coreg    Generalized body pain aches or spasm.  Neurontin at home.  Will resume.  Hold off with NSAIDs for now.   Morbid Obesity -BMI 40.8 -counseled on importance of diet and exercise  Discharge Instructions  Discharge Instructions     [facility] pharmacist to adjust dose for Vancomycin, Aminoglycosides and other anti-infective therapies as requested by physician.   Complete by: As directed    [facility] to provide Cath Flo 55m   Complete by: As directed    Administer for PICC line occlusion and as ordered by physician for other access device issues.   Anaphylaxis Kit: Provided to treat any anaphylactic reaction to the medication being provided to the patient if First Dose or when requested by physician   Complete by: As directed    Epinephrine 114mml vial / amp: Administer 0.75m26m0.75ml80mubcutaneously once for moderate to severe anaphylaxis, nurse to call physician and pharmacy when reaction occurs and call 911 if needed for immediate care   Diphenhydramine 50mg57mIV vial: Administer 25-50mg 15mM PRN for first dose reaction, rash, itching, mild reaction, nurse to call physician and pharmacy when reaction occurs   Sodium Chloride 0.9% NS 500ml I56mdminister if needed for hypovolemic blood pressure drop or as ordered by physician after call to physician with anaphylactic reaction   Change dressing on IV access line weekly and PRN   Complete by: As directed    Diet - low sodium heart healthy   Complete by: As directed    Flush IV access with Sodium Chloride 0.9% and Heparin 10 units/ml or 100 units/ml   Complete by: As directed    Home infusion instructions - [facility]   Complete by: As directed    Instructions: Flush IV access with Sodium Chloride 0.9% and Heparin 10units/ml or 100units/ml   Change dressing on IV access line: Weekly and PRN   Instructions Cath Flo 2mg: Ad82mister for PICC Line occlusion and as ordered by physician for other access device    [facility] pharmacist to adjust dose for: Vancomycin, Aminoglycosides and other anti-infective therapies as requested by physician   Increase activity slowly   Complete by: As directed    Method of administration may be changed at the discretion of home infusion pharmacist based upon assessment of the patient and/or caregiver's ability to self-administer the medication ordered   Complete by: As directed    No wound care   Complete by: As directed       Allergies as of 06/03/2021       Reactions   Other Rash   Burns skin.  Please use paper tape only Burns skin.  Please use paper tape only   Docetaxel Nausea Only   Diaphoretic.  Patient had hypersensitivity reaction to docetaxel.  See progress note from 05/24/2021.  Patient able to complete infusion.   Norvasc [amlodipine] Swelling   Tape Other (See Comments)   Burns skin.  Please use paper tape only        Medication List     STOP taking these medications    ibuprofen 800 MG tablet Commonly known as: ADVIL       TAKE these medications    Accu-Chek Aviva Connect w/Device Kit Check sugar 1x daily   Accu-Chek Aviva Plus test strip Generic drug:  glucose blood Check sugar 1x daily   Accu-Chek Softclix Lancets lancets Use as instructed   acetaminophen 500 MG tablet Commonly known as: TYLENOL Take 500 mg by mouth every 6 (six) hours as needed for mild pain, fever or headache.   aspirin 81 MG EC tablet Take 1 tablet (81 mg total) by mouth daily. What changed: when to take this   carvedilol 25 MG tablet Commonly known as: COREG Take 12.5 mg by mouth 2 (two) times daily with a meal.   dexamethasone 4 MG tablet Commonly known as: DECADRON Take 2 tablets (8 mg total) by mouth 2 (two) times daily. Start the day before Taxotere. Then again the day after chemo for 3 days. Notes to patient: As scheduled   ertapenem  IVPB Commonly known as: INVANZ Inject 1 g into the vein daily for 10 days. Indication:   ESBL E Coli Bacteremia First Dose: Yes Last Day of Therapy:  06/11/21 Labs - Once weekly:  CBC/D and BMP, Labs - Every other week:  ESR and CRP Method of administration: Mini-Bag Plus / Gravity Method of administration may be changed at the discretion of home infusion pharmacist based upon assessment of the patient and/or caregiver's ability to self-administer the medication ordered. Notes to patient: Start Sunday 06/04/21   escitalopram 10 MG tablet Commonly known as: LEXAPRO Take 10 mg by mouth daily.   gabapentin 600 MG tablet Commonly known as: NEURONTIN Take 600 mg by mouth daily. What changed: Another medication with the same name was removed. Continue taking this medication, and follow the directions you see here.   Lantus SoloStar 100 UNIT/ML Solostar Pen Generic drug: insulin glargine Inject 25 Units into the skin 2 (two) times daily. What changed:  how much to take when to take this   loperamide 2 MG capsule Commonly known as: IMODIUM Take 1 capsule (2 mg total) by mouth 4 (four) times daily as needed for diarrhea or loose stools.   mycophenolate 180 MG EC tablet Commonly known as: MYFORTIC Take 360 mg by mouth 2 (two) times daily.   NovoLOG FlexPen 100 UNIT/ML FlexPen Generic drug: insulin aspart Inject 5 Units into the skin 3 (three) times daily.   ondansetron 8 MG tablet Commonly known as: Zofran Take 1 tablet (8 mg total) by mouth 2 (two) times daily as needed for refractory nausea / vomiting. Start on day 3 after chemo.   oxyCODONE 5 MG immediate release tablet Commonly known as: Oxy IR/ROXICODONE Take 1 tablet (5 mg total) by mouth every 6 (six) hours as needed for severe pain.   pantoprazole 40 MG tablet Commonly known as: PROTONIX Take 40 mg by mouth daily.   Pen Needles 30G X 8 MM Misc 1 each by Does not apply route daily. E11.9   predniSONE 5 MG tablet Commonly known as: DELTASONE Take 5 mg by mouth daily.   prochlorperazine 10 MG  tablet Commonly known as: COMPAZINE Take 1 tablet (10 mg total) by mouth every 6 (six) hours as needed (Nausea or vomiting).   rosuvastatin 5 MG tablet Commonly known as: CRESTOR Take 5 mg by mouth at bedtime.   tacrolimus 1 MG capsule Commonly known as: PROGRAF Take 3 mg by mouth daily. What changed: Another medication with the same name was removed. Continue taking this medication, and follow the directions you see here.   valACYclovir 1000 MG tablet Commonly known as: VALTREX Take 1 tablet (1,000 mg total) by mouth 2 (two) times daily.  Discharge Care Instructions  (From admission, onward)           Start     Ordered   06/02/21 0000  Change dressing on IV access line weekly and PRN  (Home infusion instructions - [facility] )        06/02/21 1115            Follow-up Information     [facility] [place] [facility] Follow up.   Specialty: Home Health Services Why: For home health RN Contact information: [address] [place] [address] [phone]                Allergies  Allergen Reactions   Other Rash    Burns skin.  Please use paper tape only Burns skin.  Please use paper tape only   Docetaxel Nausea Only    Diaphoretic.  Patient had hypersensitivity reaction to docetaxel.  See progress note from 05/24/2021.  Patient able to complete infusion.   Norvasc [Amlodipine] Swelling   Tape Other (See Comments)    Burns skin.  Please use paper tape only    Consultations: Infectious disease oncology   Procedures/Studies: DG Chest 2 View  Result Date: 05/28/2021 CLINICAL DATA:  Low-grade fever EXAM: CHEST - 2 VIEW COMPARISON:  06/01/2019 FINDINGS: The heart size and mediastinal contours are within normal limits. Both lungs are clear. The visualized skeletal structures are unremarkable. IMPRESSION: No active cardiopulmonary disease. Electronically Signed   By: Inez Catalina M.D.   On: 05/28/2021 03:04   US  RENAL  Result Date: 05/29/2021 CLINICAL DATA:  Bacteremia EXAM: RENAL / URINARY TRACT ULTRASOUND COMPLETE COMPARISON:  Renal ultrasound 05/30/2020. FINDINGS: Right Kidney: Renal measurements: 8.4 x 4.4 x 3.8 cm = volume: 73 mL. Difficult to visualize. Increased renal echogenicity. No hydronephrosis. Left Kidney: Not visualized. Bladder: Appears normal for degree of bladder distention. Other: Transplanted kidney not evaluated. IMPRESSION: 1. Native right kidney is small and echogenic likely related to medical renal disease. 2. Native left kidney not visualized. Electronically Signed   By: Ronney Asters M.D.   On: 05/29/2021 16:52   [place] EKG SITE RITE  Result Date: 06/01/2021 If Site Rite image not attached, placement could not be confirmed due to current cardiac rhythm.     Subjective:  No complaints. Feels well. Eager to discharge home  Discharge Exam: Vitals:   06/02/21 2136 06/03/21 0500 06/03/21 0646 06/03/21 1422  BP: (!) 163/57  (!) 160/60 (!) 183/71  Pulse: 73  73 76  Resp: '16  17 18  ' Temp: 98.2 F (36.8 C)  98.3 F (36.8 C) 98.4 F (36.9 C)  TempSrc: Oral  Oral   SpO2: 96%  96% 95%  Weight:  99.7 kg    Height:        General: Pt is alert, awake, not in acute distress Cardiovascular: RRR, S1/S2 +, no rubs, no gallops Respiratory: CTA bilaterally, no wheezing, no rhonchi Abdominal: Soft, NT, ND, bowel sounds + Extremities: no edema, no cyanosis    The results of significant diagnostics from this hospitalization (including imaging, microbiology, ancillary and laboratory) are listed below for reference.     Microbiology: Recent Results (from the past 240 hour(s))  Resp Panel by RT-PCR (Flu A&B, Covid) Nasopharyngeal Swab     Status: None   Collection Time: 05/27/21 11:52 PM   Specimen: Nasopharyngeal Swab; Nasopharyngeal(NP) swabs in vial transport medium  Result Value Ref Range Status   SARS Coronavirus 2 by RT PCR NEGATIVE  NEGATIVE Final    Comment:  (NOTE) SARS-CoV-2 target nucleic acids are NOT DETECTED.  The SARS-CoV-2 RNA is generally detectable in upper respiratory specimens during the acute phase of infection. The lowest concentration of SARS-CoV-2 viral copies this assay can detect is 138 copies/mL. A negative result does not preclude SARS-Cov-2 infection and should not be used as the sole basis for treatment or other patient management decisions. A negative result may occur with  improper specimen collection/handling, submission of specimen other than nasopharyngeal swab, presence of viral mutation(s) within the areas targeted by this assay, and inadequate number of viral copies(<138 copies/mL). A negative result must be combined with clinical observations, patient history, and epidemiological information. The expected result is Negative.  Fact Sheet for Patients:  EntrepreneurPulse.com.au  Fact Sheet for Healthcare Providers:  IncredibleEmployment.be  This test is no t yet approved or cleared by the [place] FDA and  has been authorized for detection and/or diagnosis of SARS-CoV-2 by FDA under an Emergency Use Authorization (EUA). This EUA will remain  in effect (meaning this test can be used) for the duration of the COVID-19 declaration under Section 564(b)(1) of the Act, 21 U.S.C.section 360bbb-3(b)(1), unless the authorization is terminated  or revoked sooner.       Influenza A by PCR NEGATIVE NEGATIVE Final   Influenza B by PCR NEGATIVE NEGATIVE Final    Comment: (NOTE) The Xpert Xpress SARS-CoV-2/FLU/RSV plus assay is intended as an aid in the diagnosis of influenza from Nasopharyngeal swab specimens and should not be used as a sole basis for treatment. Nasal washings and aspirates are unacceptable for Xpert Xpress SARS-CoV-2/FLU/RSV testing.  Fact Sheet for Patients: EntrepreneurPulse.com.au  Fact Sheet for Healthcare  Providers: IncredibleEmployment.be  This test is not yet approved or cleared by the [place] FDA and has been authorized for detection and/or diagnosis of SARS-CoV-2 by FDA under an Emergency Use Authorization (EUA). This EUA will remain in effect (meaning this test can be used) for the duration of the COVID-19 declaration under Section 564(b)(1) of the Act, 21 U.S.C. section 360bbb-3(b)(1), unless the authorization is terminated or revoked.  Performed at [facility], [place] [address] [place], [place] [address]   Urine Culture     Status: Abnormal   Collection Time: 05/28/21 12:17 AM   Specimen: Urine, Clean Catch  Result Value Ref Range Status   Specimen Description   Final    URINE, CLEAN CATCH Performed at [facility], [place] [address], [place], [place] [address]    Special Requests   Final    NONE Performed at [facility], [place] [address] [place], [place] [address]    Culture (A)  Final    >=100,000 COLONIES/mL ESCHERICHIA COLI Confirmed Extended Spectrum Beta-Lactamase Producer (ESBL).  In bloodstream infections from ESBL organisms, carbapenems are preferred over piperacillin/tazobactam. They are shown to have a lower risk of mortality.    Report Status 05/30/2021 FINAL  Final   Organism ID, Bacteria ESCHERICHIA COLI (A)  Final      Susceptibility   Escherichia coli - MIC*    AMPICILLIN >=32 RESISTANT Resistant     CEFAZOLIN >=64 RESISTANT Resistant     CEFEPIME 16 RESISTANT Resistant     CEFTRIAXONE >=64 RESISTANT Resistant     CIPROFLOXACIN >=4 RESISTANT Resistant     GENTAMICIN >=16 RESISTANT Resistant     IMIPENEM <=0.25 SENSITIVE Sensitive     NITROFURANTOIN <=16 SENSITIVE Sensitive     TRIMETH/SULFA <=20 SENSITIVE Sensitive  AMPICILLIN/SULBACTAM 4 SENSITIVE Sensitive     PIP/TAZO <=4 SENSITIVE Sensitive     * >=100,000 COLONIES/mL ESCHERICHIA COLI  Culture, blood (routine x 2)      Status: Abnormal   Collection Time: 05/28/21 12:23 AM   Specimen: BLOOD  Result Value Ref Range Status   Specimen Description   Final    BLOOD RIGHT ANTECUBITAL Performed at [facility] [address] [place], [place] [address]    Special Requests   Final    BOTTLES DRAWN AEROBIC AND ANAEROBIC Blood Culture results may not be optimal due to an excessive volume of blood received in culture bottles Performed at [facility] [address], [place], [place] [address]    Culture  Setup Time   Final    GRAM NEGATIVE RODS IN BOTH AEROBIC AND ANAEROBIC BOTTLES CRITICAL RESULT CALLED TO, READ BACK BY AND VERIFIED WITH: PHARM D J.LEGGE ON [id number] AT 1804 BY E.PARRISH Performed at [facility], [place] [address], [place], [place] [address]    Culture (A)  Final    ESCHERICHIA COLI Confirmed Extended Spectrum Beta-Lactamase Producer (ESBL).  In bloodstream infections from ESBL organisms, carbapenems are preferred over piperacillin/tazobactam. They are shown to have a lower risk of mortality.    Report Status 05/30/2021 FINAL  Final   Organism ID, Bacteria ESCHERICHIA COLI  Final      Susceptibility   Escherichia coli - MIC*    AMPICILLIN >=32 RESISTANT Resistant     CEFAZOLIN >=64 RESISTANT Resistant     CEFEPIME 16 RESISTANT Resistant     CEFTAZIDIME RESISTANT Resistant     CEFTRIAXONE >=64 RESISTANT Resistant     CIPROFLOXACIN >=4 RESISTANT Resistant     GENTAMICIN >=16 RESISTANT Resistant     IMIPENEM <=0.25 SENSITIVE Sensitive     TRIMETH/SULFA <=20 SENSITIVE Sensitive     AMPICILLIN/SULBACTAM 8 SENSITIVE Sensitive     PIP/TAZO <=4 SENSITIVE Sensitive     * ESCHERICHIA COLI  Blood Culture ID Panel (Reflexed)     Status: Abnormal   Collection Time: 05/28/21 12:23 AM  Result Value Ref Range Status   Enterococcus faecalis NOT DETECTED NOT DETECTED Final   Enterococcus Faecium NOT DETECTED NOT DETECTED Final   Listeria monocytogenes NOT  DETECTED NOT DETECTED Final   Staphylococcus species NOT DETECTED NOT DETECTED Final   Staphylococcus aureus (BCID) NOT DETECTED NOT DETECTED Final   Staphylococcus epidermidis NOT DETECTED NOT DETECTED Final   Staphylococcus lugdunensis NOT DETECTED NOT DETECTED Final   Streptococcus species NOT DETECTED NOT DETECTED Final   Streptococcus agalactiae NOT DETECTED NOT DETECTED Final   Streptococcus pneumoniae NOT DETECTED NOT DETECTED Final   Streptococcus pyogenes NOT DETECTED NOT DETECTED Final   A.calcoaceticus-baumannii NOT DETECTED NOT DETECTED Final   Bacteroides fragilis NOT DETECTED NOT DETECTED Final   Enterobacterales DETECTED (A) NOT DETECTED Final    Comment: Enterobacterales represent a large order of gram negative bacteria, not a single organism. CRITICAL RESULT CALLED TO, READ BACK BY AND VERIFIED WITH: PHARM D J.LEGGE ON [id number] AT 1804 BY E.PARRISH    Enterobacter cloacae complex NOT DETECTED NOT DETECTED Final   Escherichia coli DETECTED (A) NOT DETECTED Final    Comment: CRITICAL RESULT CALLED TO, READ BACK BY AND VERIFIED WITH: PHARM D J.LEGGE ON [id number] AT 1804 BY E.PARRISH    Klebsiella aerogenes NOT DETECTED NOT DETECTED Final   Klebsiella oxytoca NOT DETECTED NOT DETECTED Final   Klebsiella pneumoniae NOT DETECTED NOT DETECTED Final  Proteus species NOT DETECTED NOT DETECTED Final   Salmonella species NOT DETECTED NOT DETECTED Final   Serratia marcescens NOT DETECTED NOT DETECTED Final   Haemophilus influenzae NOT DETECTED NOT DETECTED Final   Neisseria meningitidis NOT DETECTED NOT DETECTED Final   Pseudomonas aeruginosa NOT DETECTED NOT DETECTED Final   Stenotrophomonas maltophilia NOT DETECTED NOT DETECTED Final   Candida albicans NOT DETECTED NOT DETECTED Final   Candida auris NOT DETECTED NOT DETECTED Final   Candida glabrata NOT DETECTED NOT DETECTED Final   Candida krusei NOT DETECTED NOT DETECTED Final   Candida parapsilosis NOT DETECTED NOT  DETECTED Final   Candida tropicalis NOT DETECTED NOT DETECTED Final   Cryptococcus neoformans/gattii NOT DETECTED NOT DETECTED Final   CTX-M ESBL DETECTED (A) NOT DETECTED Final    Comment: CRITICAL RESULT CALLED TO, READ BACK BY AND VERIFIED WITH: PHARM D J.LEGGE ON [id number] AT 1804 BY E.PARRISH (NOTE) Extended spectrum beta-lactamase detected. Recommend a carbapenem as initial therapy.      Carbapenem resistance IMP NOT DETECTED NOT DETECTED Final   Carbapenem resistance KPC NOT DETECTED NOT DETECTED Final   Carbapenem resistance NDM NOT DETECTED NOT DETECTED Final   Carbapenem resist OXA 48 LIKE NOT DETECTED NOT DETECTED Final   Carbapenem resistance VIM NOT DETECTED NOT DETECTED Final    Comment: Performed at [facility], [place] [address], [place], [place] [address]  Culture, blood (routine x 2)     Status: Abnormal   Collection Time: 05/28/21 12:28 AM   Specimen: BLOOD  Result Value Ref Range Status   Specimen Description   Final    BLOOD BLOOD RIGHT HAND Performed at [facility] [address] [place], [place] [address]    Special Requests   Final    BOTTLES DRAWN AEROBIC AND ANAEROBIC Blood Culture adequate volume Performed at [facility] [address] [place], [place] [address]    Culture  Setup Time   Final    GRAM NEGATIVE RODS IN BOTH AEROBIC AND ANAEROBIC BOTTLES CRITICAL VALUE NOTED.  VALUE IS CONSISTENT WITH PREVIOUSLY REPORTED AND CALLED VALUE.    Culture (A)  Final    ESCHERICHIA COLI SUSCEPTIBILITIES PERFORMED ON PREVIOUS CULTURE WITHIN THE LAST 5 DAYS. Performed at [facility], [place] [address], [place], [place] [address]    Report Status 05/30/2021 FINAL  Final  Culture, blood (routine x 2)     Status: None (Preliminary result)   Collection Time: 05/30/21  2:23 PM   Specimen: BLOOD RIGHT FOREARM  Result Value Ref Range Status   Specimen Description   Final    BLOOD RIGHT FOREARM Performed at  [facility] [address], [place], [place] [address]    Special Requests   Final    BOTTLES DRAWN AEROBIC ONLY Blood Culture results may not be optimal due to an inadequate volume of blood received in culture bottles Performed at [facility] [address], [place], [place] [address]    Culture   Final    NO GROWTH 4 DAYS Performed at [facility], [place] [address], [place], [place] [address]    Report Status PENDING  Incomplete  Culture, blood (routine x 2)     Status: None (Preliminary result)   Collection Time: 05/30/21  2:23 PM   Specimen: BLOOD RIGHT WRIST  Result Value Ref Range Status   Specimen Description   Final    BLOOD RIGHT WRIST Performed at [facility]  [place] [address], [place], [place] [address]    Special Requests   Final    BOTTLES DRAWN AEROBIC ONLY Blood Culture results may not be optimal due to an inadequate volume of blood received in culture bottles Performed at [facility] [address] [place], [place] [address]    Culture   Final    NO GROWTH 4 DAYS Performed at [facility], [place] [address] [place], [place] [address]    Report Status PENDING  Incomplete     Labs: BNP (last 3 results) No results for input(s): BNP in the last 8760 hours. Basic Metabolic Panel: Recent Labs  Lab 05/29/21 0509 05/30/21 0536 05/31/21 0554 06/01/21 0601  NA 126* 129* 129* 131*  K 5.0 5.1 4.7 4.7  CL 98 98 98 99  CO2 '25 24 25 26  ' GLUCOSE 228* 163* 200* 224*  BUN 44* 37* 32* 29*  CREATININE 1.61* 1.36* 1.28* 1.26*  CALCIUM 8.5* 9.4 9.1 9.3  MG  --  1.8  --   --   PHOS  --  2.1*  --  2.3*   Liver Function Tests: Recent Labs  Lab 06/01/21 0601  ALBUMIN 2.4*   No results for input(s): LIPASE, AMYLASE in the last 168 hours. No results for input(s): AMMONIA in the last 168 hours. CBC: Recent Labs  Lab 05/30/21 0536 05/30/21 1635 05/31/21 0554 06/01/21 0601  06/02/21 0716  WBC 4.3 4.5 6.7 19.5* 32.4*  NEUTROABS 2.7 3.2  --   --  23.5*  HGB 11.7* 10.7* 10.1* 10.8* 10.1*  HCT 35.2* 32.8* 31.1* 33.2* 31.0*  MCV 90.3 89.9 92.0 92.0 90.6  PLT 66* 88* 117* 157 190   Cardiac Enzymes: No results for input(s): CKTOTAL, CKMB, CKMBINDEX, TROPONINI in the last 168 hours. BNP: Invalid input(s): POCBNP CBG: Recent Labs  Lab 06/02/21 1703 06/02/21 2131 06/03/21 0750 06/03/21 1126 06/03/21 1702  GLUCAP 264* 281* 104* 178* 172*   D-Dimer No results for input(s): DDIMER in the last 72 hours. Hgb A1c No results for input(s): HGBA1C in the last 72 hours. Lipid Profile No results for input(s): CHOL, HDL, LDLCALC, TRIG, CHOLHDL, LDLDIRECT in the last 72 hours. Thyroid function studies No results for input(s): TSH, T4TOTAL, T3FREE, THYROIDAB in the last 72 hours.  Invalid input(s): FREET3 Anemia work up No results for input(s): VITAMINB12, FOLATE, FERRITIN, TIBC, IRON, RETICCTPCT in the last 72 hours. Urinalysis    Component Value Date/Time   COLORURINE YELLOW 05/27/2021 0008   APPEARANCEUR HAZY (A) 05/27/2021 0008   LABSPEC 1.019 05/27/2021 0008   PHURINE 5.0 05/27/2021 0008   GLUCOSEU 150 (A) 05/27/2021 0008   HGBUR NEGATIVE 05/27/2021 0008   BILIRUBINUR NEGATIVE 05/27/2021 0008   KETONESUR NEGATIVE 05/27/2021 0008   PROTEINUR 30 (A) 05/27/2021 0008   UROBILINOGEN 0.2 05/15/2013 1100   NITRITE POSITIVE (A) 05/27/2021 0008   LEUKOCYTESUR MODERATE (A) 05/27/2021 0008   Sepsis Labs Invalid input(s): PROCALCITONIN,  WBC,  LACTICIDVEN Microbiology Recent Results (from the past 240 hour(s))  Resp Panel by RT-PCR (Flu A&B, Covid) Nasopharyngeal Swab     Status: None   Collection Time: 05/27/21 11:52 PM   Specimen: Nasopharyngeal Swab; Nasopharyngeal(NP) swabs in vial transport medium  Result Value Ref Range Status   SARS Coronavirus 2 by RT PCR NEGATIVE NEGATIVE Final    Comment: (NOTE) SARS-CoV-2 target nucleic acids are NOT  DETECTED.  The SARS-CoV-2 RNA is generally detectable in upper respiratory specimens during the acute phase of  infection. The lowest concentration of SARS-CoV-2 viral copies this assay can detect is 138 copies/mL. A negative result does not preclude SARS-Cov-2 infection and should not be used as the sole basis for treatment or other patient management decisions. A negative result may occur with  improper specimen collection/handling, submission of specimen other than nasopharyngeal swab, presence of viral mutation(s) within the areas targeted by this assay, and inadequate number of viral copies(<138 copies/mL). A negative result must be combined with clinical observations, patient history, and epidemiological information. The expected result is Negative.  Fact Sheet for Patients:  EntrepreneurPulse.com.au  Fact Sheet for Healthcare Providers:  IncredibleEmployment.be  This test is no t yet approved or cleared by the [place] FDA and  has been authorized for detection and/or diagnosis of SARS-CoV-2 by FDA under an Emergency Use Authorization (EUA). This EUA will remain  in effect (meaning this test can be used) for the duration of the COVID-19 declaration under Section 564(b)(1) of the Act, 21 U.S.C.section 360bbb-3(b)(1), unless the authorization is terminated  or revoked sooner.       Influenza A by PCR NEGATIVE NEGATIVE Final   Influenza B by PCR NEGATIVE NEGATIVE Final    Comment: (NOTE) The Xpert Xpress SARS-CoV-2/FLU/RSV plus assay is intended as an aid in the diagnosis of influenza from Nasopharyngeal swab specimens and should not be used as a sole basis for treatment. Nasal washings and aspirates are unacceptable for Xpert Xpress SARS-CoV-2/FLU/RSV testing.  Fact Sheet for Patients: EntrepreneurPulse.com.au  Fact Sheet for Healthcare Providers: IncredibleEmployment.be  This test is not yet  approved or cleared by the [place] FDA and has been authorized for detection and/or diagnosis of SARS-CoV-2 by FDA under an Emergency Use Authorization (EUA). This EUA will remain in effect (meaning this test can be used) for the duration of the COVID-19 declaration under Section 564(b)(1) of the Act, 21 U.S.C. section 360bbb-3(b)(1), unless the authorization is terminated or revoked.  Performed at [facility], [place] [address], [place], [place] [address]   Urine Culture     Status: Abnormal   Collection Time: 05/28/21 12:17 AM   Specimen: Urine, Clean Catch  Result Value Ref Range Status   Specimen Description   Final    URINE, CLEAN CATCH Performed at [facility], [place] [address], [place], [place] [address]    Special Requests   Final    NONE Performed at [facility], [place] [address], [place], [place] [address]    Culture (A)  Final    >=100,000 COLONIES/mL ESCHERICHIA COLI Confirmed Extended Spectrum Beta-Lactamase Producer (ESBL).  In bloodstream infections from ESBL organisms, carbapenems are preferred over piperacillin/tazobactam. They are shown to have a lower risk of mortality.    Report Status 05/30/2021 FINAL  Final   Organism ID, Bacteria ESCHERICHIA COLI (A)  Final      Susceptibility   Escherichia coli - MIC*    AMPICILLIN >=32 RESISTANT Resistant     CEFAZOLIN >=64 RESISTANT Resistant     CEFEPIME 16 RESISTANT Resistant     CEFTRIAXONE >=64 RESISTANT Resistant     CIPROFLOXACIN >=4 RESISTANT Resistant     GENTAMICIN >=16 RESISTANT Resistant     IMIPENEM <=0.25 SENSITIVE Sensitive     NITROFURANTOIN <=16 SENSITIVE Sensitive     TRIMETH/SULFA <=20 SENSITIVE Sensitive     AMPICILLIN/SULBACTAM 4 SENSITIVE Sensitive     PIP/TAZO <=4 SENSITIVE Sensitive     * >=100,000 COLONIES/mL ESCHERICHIA COLI  Culture, blood (routine x 2)  Status: Abnormal   Collection Time: 05/28/21 12:23 AM   Specimen: BLOOD   Result Value Ref Range Status   Specimen Description   Final    BLOOD RIGHT ANTECUBITAL Performed at [facility] [address] [place], [place] [address]    Special Requests   Final    BOTTLES DRAWN AEROBIC AND ANAEROBIC Blood Culture results may not be optimal due to an excessive volume of blood received in culture bottles Performed at [facility] [address], [place], [place] [address]    Culture  Setup Time   Final    GRAM NEGATIVE RODS IN BOTH AEROBIC AND ANAEROBIC BOTTLES CRITICAL RESULT CALLED TO, READ BACK BY AND VERIFIED WITH: PHARM D J.LEGGE ON [id number] AT 1804 BY E.PARRISH Performed at [facility], [place] [address] [place], [place] [address]    Culture (A)  Final    ESCHERICHIA COLI Confirmed Extended Spectrum Beta-Lactamase Producer (ESBL).  In bloodstream infections from ESBL organisms, carbapenems are preferred over piperacillin/tazobactam. They are shown to have a lower risk of mortality.    Report Status 05/30/2021 FINAL  Final   Organism ID, Bacteria ESCHERICHIA COLI  Final      Susceptibility   Escherichia coli - MIC*    AMPICILLIN >=32 RESISTANT Resistant     CEFAZOLIN >=64 RESISTANT Resistant     CEFEPIME 16 RESISTANT Resistant     CEFTAZIDIME RESISTANT Resistant     CEFTRIAXONE >=64 RESISTANT Resistant     CIPROFLOXACIN >=4 RESISTANT Resistant     GENTAMICIN >=16 RESISTANT Resistant     IMIPENEM <=0.25 SENSITIVE Sensitive     TRIMETH/SULFA <=20 SENSITIVE Sensitive     AMPICILLIN/SULBACTAM 8 SENSITIVE Sensitive     PIP/TAZO <=4 SENSITIVE Sensitive     * ESCHERICHIA COLI  Blood Culture ID Panel (Reflexed)     Status: Abnormal   Collection Time: 05/28/21 12:23 AM  Result Value Ref Range Status   Enterococcus faecalis NOT DETECTED NOT DETECTED Final   Enterococcus Faecium NOT DETECTED NOT DETECTED Final   Listeria monocytogenes NOT DETECTED NOT DETECTED Final   Staphylococcus species NOT DETECTED NOT  DETECTED Final   Staphylococcus aureus (BCID) NOT DETECTED NOT DETECTED Final   Staphylococcus epidermidis NOT DETECTED NOT DETECTED Final   Staphylococcus lugdunensis NOT DETECTED NOT DETECTED Final   Streptococcus species NOT DETECTED NOT DETECTED Final   Streptococcus agalactiae NOT DETECTED NOT DETECTED Final   Streptococcus pneumoniae NOT DETECTED NOT DETECTED Final   Streptococcus pyogenes NOT DETECTED NOT DETECTED Final   A.calcoaceticus-baumannii NOT DETECTED NOT DETECTED Final   Bacteroides fragilis NOT DETECTED NOT DETECTED Final   Enterobacterales DETECTED (A) NOT DETECTED Final    Comment: Enterobacterales represent a large order of gram negative bacteria, not a single organism. CRITICAL RESULT CALLED TO, READ BACK BY AND VERIFIED WITH: PHARM D J.LEGGE ON [id number] AT 1804 BY E.PARRISH    Enterobacter cloacae complex NOT DETECTED NOT DETECTED Final   Escherichia coli DETECTED (A) NOT DETECTED Final    Comment: CRITICAL RESULT CALLED TO, READ BACK BY AND VERIFIED WITH: PHARM D J.LEGGE ON [id number] AT 1804 BY E.PARRISH    Klebsiella aerogenes NOT DETECTED NOT DETECTED Final   Klebsiella oxytoca NOT DETECTED NOT DETECTED Final   Klebsiella pneumoniae NOT DETECTED NOT DETECTED Final   Proteus species NOT DETECTED NOT DETECTED Final   Salmonella species NOT DETECTED NOT DETECTED Final   Serratia marcescens NOT DETECTED NOT DETECTED Final   Haemophilus influenzae NOT  DETECTED NOT DETECTED Final   Neisseria meningitidis NOT DETECTED NOT DETECTED Final   Pseudomonas aeruginosa NOT DETECTED NOT DETECTED Final   Stenotrophomonas maltophilia NOT DETECTED NOT DETECTED Final   Candida albicans NOT DETECTED NOT DETECTED Final   Candida auris NOT DETECTED NOT DETECTED Final   Candida glabrata NOT DETECTED NOT DETECTED Final   Candida krusei NOT DETECTED NOT DETECTED Final   Candida parapsilosis NOT DETECTED NOT DETECTED Final   Candida tropicalis NOT DETECTED NOT DETECTED Final    Cryptococcus neoformans/gattii NOT DETECTED NOT DETECTED Final   CTX-M ESBL DETECTED (A) NOT DETECTED Final    Comment: CRITICAL RESULT CALLED TO, READ BACK BY AND VERIFIED WITH: PHARM D J.LEGGE ON [id number] AT 1804 BY E.PARRISH (NOTE) Extended spectrum beta-lactamase detected. Recommend a carbapenem as initial therapy.      Carbapenem resistance IMP NOT DETECTED NOT DETECTED Final   Carbapenem resistance KPC NOT DETECTED NOT DETECTED Final   Carbapenem resistance NDM NOT DETECTED NOT DETECTED Final   Carbapenem resist OXA 48 LIKE NOT DETECTED NOT DETECTED Final   Carbapenem resistance VIM NOT DETECTED NOT DETECTED Final    Comment: Performed at [facility], [place] [address], [place], [place] [address]  Culture, blood (routine x 2)     Status: Abnormal   Collection Time: 05/28/21 12:28 AM   Specimen: BLOOD  Result Value Ref Range Status   Specimen Description   Final    BLOOD BLOOD RIGHT HAND Performed at [facility] [address] [place], [place] [address]    Special Requests   Final    BOTTLES DRAWN AEROBIC AND ANAEROBIC Blood Culture adequate volume Performed at [facility] [address] [place], [place] [address]    Culture  Setup Time   Final    GRAM NEGATIVE RODS IN BOTH AEROBIC AND ANAEROBIC BOTTLES CRITICAL VALUE NOTED.  VALUE IS CONSISTENT WITH PREVIOUSLY REPORTED AND CALLED VALUE.    Culture (A)  Final    ESCHERICHIA COLI SUSCEPTIBILITIES PERFORMED ON PREVIOUS CULTURE WITHIN THE LAST 5 DAYS. Performed at [facility], [place] [address], [place], [place] [address]    Report Status 05/30/2021 FINAL  Final  Culture, blood (routine x 2)     Status: None (Preliminary result)   Collection Time: 05/30/21  2:23 PM   Specimen: BLOOD RIGHT FOREARM  Result Value Ref Range Status   Specimen Description   Final    BLOOD RIGHT FOREARM Performed at [facility] [address] [place], [place] [address]     Special Requests   Final    BOTTLES DRAWN AEROBIC ONLY Blood Culture results may not be optimal due to an inadequate volume of blood received in culture bottles Performed at [facility] [address] [place], [place] [address]    Culture   Final    NO GROWTH 4 DAYS Performed at [facility], [place] [address] [place], [place] [address]    Report Status PENDING  Incomplete  Culture, blood (routine x 2)     Status: None (Preliminary result)   Collection Time: 05/30/21  2:23 PM   Specimen: BLOOD RIGHT WRIST  Result Value Ref Range Status   Specimen Description   Final    BLOOD RIGHT WRIST Performed at [place] [address], [place], [place] [address]    Special Requests   Final    BOTTLES DRAWN AEROBIC ONLY Blood Culture results may not  be optimal due to an inadequate volume of blood received in culture bottles Performed at [facility] [address], [place], [place] [address]    Culture   Final    NO GROWTH 4 DAYS Performed at [facility], [place] [address], [place], [place] [address]    Report Status PENDING  Incomplete     Time coordinating discharge: 53mns  SIGNED:   JKathie Dike MD  [facility] 06/03/2021, 11:29 PM   If 7PM-7AM, please contact night-coverage [email or web address]

## 2021-06-03 NOTE — Progress Notes (Incomplete)
Secure chat with Dr Roderic Palau, spoke with nephrology and gained approval for PICC or midline.  Due to extensive medical hx, decision agreed upon was for midline placement for 8 days of ABT.

## 2021-06-03 NOTE — Progress Notes (Signed)
Clarified with Dr Roderic Palau that it was cleared with Nephrology to place Midline. Will continue to monitor.

## 2021-06-03 NOTE — TOC Transition Note (Signed)
Transition of Care [facility]) - CM/SW Discharge Note   Patient Details  Name: Cynthia [name] MRN: [id number] Date of Birth: May 24, 1971  [facility] CM/SW Contact:  Ross Ludwig, LCSW Phone Number: 06/03/2021, 9:53 AM   Clinical Narrative:     Patient will be going home with home health through [place].  Home IV antibiotics are set up with Pam from [facility] home infusion team.  [place] signing off please reconsult with any other social work needs, home health agency has been notified of planned discharge.   Final next level of care: [place] Barriers to Discharge: Barriers Resolved   Patient Goals and CMS Choice Patient states their goals for this hospitalization and ongoing recovery are:: To return back home with IV antibiotics and home health. CMS Medicare.gov Compare Post Acute Care list provided to:: Patient Choice offered to / list presented to : Patient  Discharge Placement                       Discharge Plan and Services   Discharge Planning Services: CM Consult Post Acute Care Choice: Home Health          DME Arranged: IV pump/equipment DME Agency: Other - Comment Roel Cluck) Date DME Agency Contacted: 06/03/21 Time DME Agency Contacted: [phone] Representative spoke with at DME Agency: Carolynn Sayers HH Arranged: RN [place] Agency: [place] Date [place]: 06/03/21 Time [place]: [phone] Representative spoke with at [place]: [place] ([place]) Interventions     Readmission Risk Interventions Readmission Risk Prevention Plan 06/02/2021  Transportation Screening Complete  PCP or Specialist Appt within 3-5 Days Complete  HRI or [place] Complete  Social Work Consult for [place] Planning/Counseling Complete  Palliative Care Screening Not Applicable  Medication Review Press photographer) Complete  Some recent data might be hidden

## 2021-06-03 NOTE — Progress Notes (Signed)
Patient discharged to home with family, discharge instructions reviewed with patient and family who verbalized understanding.

## 2021-06-04 LAB — CULTURE, BLOOD (ROUTINE X 2)
Culture: NO GROWTH
Culture: NO GROWTH

## 2021-06-05 ENCOUNTER — Ambulatory Visit: Payer: Medicare HMO | Attending: Surgery

## 2021-06-05 ENCOUNTER — Other Ambulatory Visit: Payer: Self-pay

## 2021-06-05 VITALS — Wt 207.5 lb

## 2021-06-05 DIAGNOSIS — Z483 Aftercare following surgery for neoplasm: Secondary | ICD-10-CM

## 2021-06-05 NOTE — Therapy (Signed)
[facility] @ [place], [place], [address] Phone: [phone]   Fax:  [phone]  Physical Therapy Treatment  Patient Details  Name: Cynthia [name] MRN: [id number] Date of Birth: [birth year] Referring Provider (PT): Dr. 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   Breast cancer [facility])    Cataract    surgical repair bilateral   Depression    ESRD on hemodialysis ([place])    Home HD 5x per week- not on dialysis now had tramsplant 4/17   Family history of ovarian cancer 02/22/2021   GERD (gastroesophageal reflux disease)    Hearing loss 2017   right ear   History of blood transfusion    transfusion reaction   Hyperlipidemia    Hypertension    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 per pt-dr levy also II   Kidney transplant recipient    Neuromuscular disorder ([place])    NEUROPATHY   Sleep apnea     Past Surgical History:  Procedure Laterality Date   ABDOMINAL HYSTERECTOMY     BREAST EXCISIONAL BIOPSY Right 08/2015   BREAST LUMPECTOMY WITH RADIOACTIVE SEED LOCALIZATION Right 09/14/2015   Procedure: RIGHT BREAST LUMPECTOMY WITH RADIOACTIVE SEED LOCALIZATION;  Surgeon: Donnie Mesa, MD;  Location: [facility];  Service: General;  Laterality: Right;   BREAST LUMPECTOMY WITH RADIOACTIVE SEED LOCALIZATION Left 03/02/2021   Procedure: LEFT BREAST SEED LUMPECTOMY LEFT SENTINEL LYMPH NODE [place];   Surgeon: Erroll Luna, MD;  Location: [facility];  Service: General;  Laterality: Left;  GEN AND PEC BLOCK   BREAST SURGERY Bilateral    biopsy bilateral   CATARACT EXTRACTION Bilateral    bilateral   CHOLECYSTECTOMY     CYST REMOVAL NECK     DIALYSIS FISTULA CREATION Left    EYE SURGERY Bilateral    lazer   LEFT HEART CATH AND CORONARY ANGIOGRAPHY N/A 03/04/2019   Procedure: LEFT HEART CATH AND CORONARY ANGIOGRAPHY;  Surgeon: Martinique, Peter M, MD;  Location: [facility];  Service: Cardiovascular;  Laterality: N/A;   LEFT HEART CATHETERIZATION WITH CORONARY ANGIOGRAM N/A 03/30/2014   Procedure: LEFT HEART CATHETERIZATION WITH CORONARY ANGIOGRAM;  Surgeon: Sinclair Grooms, MD;  [facility];  Service: Cardiovascular;  Laterality: N/A;   LIPOMA EXCISION N/A 02/06/2017   Procedure: EXCISION POSTERIOR NECK SEBACEOUS CYST;  Surgeon: Coralie Keens, MD;  Location: [facility];  Service: General;  Laterality: N/A;   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   Procedure: REPAIR OF ARTERIOVENOUS FISTULA ANEURYSM;  Surgeon: Serafina Mitchell, MD;  Location: [facility];  Service: Vascular;  Laterality: Left;   REVISON OF ARTERIOVENOUS FISTULA Left 09/28/2013   Procedure: EXCISE ESCHAR LEFT ARM  ARTERIOVENOUS FISTULA WITH PLICATION OF LEFT ARM ARTERIOVENOUS  FISTULA;  Surgeon: Elam Dutch, MD;  [facility];  Service: Vascular;  Laterality: Left;   REVISON OF ARTERIOVENOUS FISTULA Left 08/26/2015   Procedure: RESECTION ANEURYSM OF LEFT ARM ARTERIOVENOUS FISTULA  ;  Surgeon: Serafina Mitchell, MD;  [facility];  Service: Vascular;  Laterality: Left;   TUBAL LIGATION      Vitals:   06/05/21 1523  Weight: 207 lb 8 oz (94.1 kg)     Subjective Assessment - 06/05/21 1521     Subjective Pt returns for 3 month L-Dex screen.    Pertinent History Patient was diagnosed on 02/09/2021 with left grade II invasive ductal carcinoma breast cancer. She underwent a left  lumpectomy and sentinel node biopsy (6 negative nodes) on 03/02/2021. It is ER/PR positive and HER2 negative with a Ki67 of 20%. She had a kidney transplant 10/14/2015.                    L-DEX FLOWSHEETS - 06/05/21 1500       L-DEX LYMPHEDEMA SCREENING   Measurement Type Unilateral    L-DEX MEASUREMENT EXTREMITY Upper Extremity    POSITION  Standing    DOMINANT SIDE Right    At Risk Side Left    BASELINE SCORE (UNILATERAL) 12.7    L-DEX SCORE (UNILATERAL) 17.9    VALUE CHANGE (UNILAT) 5.2                                     PT Long Term Goals - 05/01/21 1455       PT LONG TERM GOAL #1   Title Patient will demonstrate she has regained full shoulder ROM and function post operatively compared to baselines.    Time 8    Period Weeks    Status Partially Met                   Plan - 06/05/21 1528     Clinical Impression Statement Pt returns for her 3 month L-Dex screen. Her change from baseline of 5.2 is WNLs so no further treament is required at this time except to cont every 3 month L-Dex screens which pt is agreeable to.    PT Next Visit Plan Cont every 3 month L-Dex screens for up tp 2 years from her SLNB (~03/03/23)    Consulted and Agree with Plan of Care Patient             Patient will benefit from skilled therapeutic intervention in order to improve the following deficits and impairments:     Visit Diagnosis: Aftercare following surgery for neoplasm     Problem List Patient Active Problem List   Diagnosis Date Noted   Acute UTI 05/28/2021   Stage 3a chronic kidney disease (CKD) ([place]) 05/28/2021   Thrombocytopenia ([place]) 05/28/2021   Aortic atherosclerosis ([place]) 03/07/2021   Hypercholesterolemia 03/07/2021   BRCA2 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 03/01/2021   Genetic testing 03/01/2021   Family history of ovarian cancer 02/22/2021   Malignant neoplasm of lower-inner quadrant of left breast in female, estrogen  receptor positive ([place]) 02/15/2021   Foot pain, bilateral 07/28/2020   COVID-19 virus infection 06/16/2019   Insulin dependent type 2 diabetes mellitus ([place]) 06/06/2019   Hyperglycemia 06/02/2019   DKA (diabetic ketoacidoses) 06/02/2019   HTN (hypertension) 03/04/2019   Abnormal nuclear stress test 03/04/2019   Diabetic nephropathy ([place]) 03/03/2019   History  of renal transplant 11/18/2015   Diabetic retinopathy associated with type 2 diabetes mellitus ([place]) 04/13/2015   Postprandial vomiting    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   Anemia in CKD (chronic kidney disease) 01/04/2015   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

## 2021-06-12 ENCOUNTER — Telehealth: Payer: Self-pay | Admitting: *Deleted

## 2021-06-12 NOTE — Telephone Encounter (Signed)
Met with Cynthia [name] significant other "Al Tlatelpa in lobby.  Claim [id number] has been denied and would like to know why." ° °Had him complete employee section of [id number] received wit [employer] letter.  Apologized for possible delay of form received 05/30/2021. ° °Provided [employer] fax number [phone] and [phone] to reapply for FMLA.  Will submit request to [employer] with new claim number.  Awaiting provider review and signature to complete.      °

## 2021-06-13 ENCOUNTER — Ambulatory Visit (INDEPENDENT_AMBULATORY_CARE_PROVIDER_SITE_OTHER): Payer: Medicare HMO | Admitting: Infectious Diseases

## 2021-06-13 ENCOUNTER — Encounter: Payer: Self-pay | Admitting: *Deleted

## 2021-06-13 ENCOUNTER — Other Ambulatory Visit: Payer: Self-pay

## 2021-06-13 VITALS — BP 120/72 | HR 85 | Temp 97.6°F | Resp 16 | Ht 61.0 in | Wt 203.0 lb

## 2021-06-13 DIAGNOSIS — D72829 Elevated white blood cell count, unspecified: Secondary | ICD-10-CM | POA: Insufficient documentation

## 2021-06-13 DIAGNOSIS — Z940 Kidney transplant status: Secondary | ICD-10-CM | POA: Diagnosis not present

## 2021-06-13 DIAGNOSIS — N390 Urinary tract infection, site not specified: Secondary | ICD-10-CM | POA: Insufficient documentation

## 2021-06-13 DIAGNOSIS — Z452 Encounter for adjustment and management of vascular access device: Secondary | ICD-10-CM

## 2021-06-13 DIAGNOSIS — C50312 Malignant neoplasm of lower-inner quadrant of left female breast: Secondary | ICD-10-CM

## 2021-06-13 HISTORY — DX: Elevated white blood cell count, unspecified: D72.829

## 2021-06-13 LAB — CBC WITH DIFFERENTIAL/PLATELET
Absolute Monocytes: 614 cells/uL (ref 200–950)
Basophils Absolute: 149 cells/uL (ref 0–200)
Basophils Relative: 2.4 %
Eosinophils Absolute: 62 cells/uL (ref 15–500)
Eosinophils Relative: 1 %
HCT: 33.7 % — ABNORMAL LOW (ref 35.0–45.0)
Hemoglobin: 10.8 g/dL — ABNORMAL LOW (ref 11.7–15.5)
Lymphs Abs: 1259 cells/uL (ref 850–3900)
MCH: 29.8 pg (ref 27.0–33.0)
MCHC: 32 g/dL (ref 32.0–36.0)
MCV: 93.1 fL (ref 80.0–100.0)
MPV: 9.7 fL (ref 7.5–12.5)
Monocytes Relative: 9.9 %
Neutro Abs: 4117 cells/uL (ref 1500–7800)
Neutrophils Relative %: 66.4 %
Platelets: 258 10*3/uL (ref 140–400)
RBC: 3.62 10*6/uL — ABNORMAL LOW (ref 3.80–5.10)
RDW: 14.1 % (ref 11.0–15.0)
Total Lymphocyte: 20.3 %
WBC: 6.2 10*3/uL (ref 3.8–10.8)

## 2021-06-13 NOTE — Progress Notes (Addendum)
Patient Active Problem List   Diagnosis Date Noted   Acute UTI 05/28/2021   Stage 3a chronic kidney disease (CKD) ([place]) 05/28/2021   Thrombocytopenia ([place]) 05/28/2021   Aortic atherosclerosis ([place]) 03/07/2021   Hypercholesterolemia 03/07/2021   BRCA2 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 03/01/2021   Genetic testing 03/01/2021   Family history of ovarian cancer 02/22/2021   Malignant neoplasm of lower-inner quadrant of left breast in female, estrogen receptor positive ([place]) 02/15/2021   Foot pain, bilateral 07/28/2020   COVID-19 virus infection 06/16/2019   Insulin dependent type 2 diabetes mellitus ([place]) 06/06/2019   Hyperglycemia 06/02/2019   DKA (diabetic ketoacidoses) 06/02/2019   HTN (hypertension) 03/04/2019   Abnormal nuclear stress test 03/04/2019   Diabetic nephropathy ([place]) 03/03/2019   History of renal transplant 11/18/2015   Diabetic retinopathy associated with type 2 diabetes mellitus ([place]) 04/13/2015   Postprandial vomiting    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   Anemia in CKD (chronic kidney disease) 01/04/2015   Pseudoaneurysm of arteriovenous dialysis fistula ([place]) 03/16/2014   Hyperkalemia 11/16/2013   Chronic constipation 07/28/2013   Paresthesias 07/28/2013   Diabetes mellitus type I ([place])    Kidney transplant status 05/28/2013   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/11/2013   Precordial chest pain 02/10/2013   Awaiting organ transplant 09/19/2012   Current Outpatient Medications on File Prior to Visit  Medication Sig Dispense Refill   Accu-Chek Softclix Lancets lancets Use as instructed 100 each 12   acetaminophen (TYLENOL) 500 MG tablet Take 500 mg by mouth every 6 (six) hours as needed for mild pain, fever or headache.     aspirin EC 81 MG EC tablet Take 1 tablet (81 mg total) by mouth daily. (Patient taking differently: Take 81 mg by mouth at bedtime.) 90 tablet 3   carvedilol (COREG) 25 MG tablet Take 12.5 mg by mouth 2 (two) times daily with a meal.       dexamethasone (DECADRON) 4 MG tablet Take 2 tablets (8 mg total) by mouth 2 (two) times daily. Start the day before Taxotere. Then again the day after chemo for 3 days. 30 tablet 1   escitalopram (LEXAPRO) 10 MG tablet Take 10 mg by mouth daily.     gabapentin (NEURONTIN) 600 MG tablet Take 600 mg by mouth daily.     glucose blood (ACCU-CHEK AVIVA PLUS) test strip Check sugar 1x daily 100 each 12   insulin glargine (LANTUS SOLOSTAR) 100 UNIT/ML Solostar Pen Inject 25 Units into the skin 2 (two) times daily. 75 mL PRN   Insulin Pen Needle (PEN NEEDLES) 30G X 8 MM MISC 1 each by Does not apply route daily. E11.9 90 each 0   mycophenolate (MYFORTIC) 180 MG EC tablet Take 360 mg by mouth 2 (two) times daily.     NOVOLOG FLEXPEN 100 UNIT/ML FlexPen Inject 5 Units into the skin 3 (three) times daily.     ondansetron (ZOFRAN) 8 MG tablet Take 1 tablet (8 mg total) by mouth 2 (two) times daily as needed for refractory nausea / vomiting. Start on day 3 after chemo. 30 tablet 1   pantoprazole (PROTONIX) 40 MG tablet Take 40 mg by mouth daily.  5   predniSONE (DELTASONE) 5 MG tablet Take 5 mg by mouth daily.   2   prochlorperazine (COMPAZINE) 10 MG tablet Take 1 tablet (10 mg total) by mouth every 6 (six) hours as needed (Nausea or vomiting). 30 tablet 1   rosuvastatin (CRESTOR) 5 MG tablet Take  5 mg by mouth at bedtime.     tacrolimus (PROGRAF) 1 MG capsule Take 3 mg by mouth daily.     valACYclovir (VALTREX) 1000 MG tablet Take 1 tablet (1,000 mg total) by mouth 2 (two) times daily. 16 tablet 0   Blood Glucose Monitoring Suppl (ACCU-CHEK AVIVA CONNECT) w/Device KIT Check sugar 1x daily 1 kit 0   No current facility-administered medications on file prior to visit.      Subjective: Here for follow up for E coli bacteremia and complicated UTI ( ESBL) in the setting of uncontrolled DM, DDKT on immunosuppression and breast ca s/p lumpectomy and chemotherapy. Spoke with patient with the help of [facility]  interpreter. Daughter also present. Received last dose of IV abtx was 12/19. Denies any fevers, chills and sweats. Feels cold all the time due to cold temperature at home. Denies nausea, vomiting, rashes or diarrhea. Denies any pain/tenderness/swelling at [facility] site. Mild suprapubic pain but overall better. She urinates at least 10 times in a day but that has been going on since start of chemo. She does not have any burning when urinating. No blood in urine. Appetite is good, She is getting chemo next week. She overall feels better since being on IV abtx.  Discussed plan to stop IV abtx and remove PICC line - agrees to the plan.   Review of Systems: ROS 10 point ROS done with pertinent positives and negatives listed above   Past Medical History:  Diagnosis Date   Allergy    pollen   Anemia    when on dialysis   Anxiety    BRCA2 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 03/01/2021   Breast cancer [facility])    Cataract    surgical repair bilateral   Depression    ESRD on hemodialysis ([place])    Home HD 5x per week- not on dialysis now had tramsplant 4/17   Family history of ovarian cancer 02/22/2021   GERD (gastroesophageal reflux disease)    Hearing loss 2017   right ear   History of blood transfusion    transfusion reaction   Hyperlipidemia    Hypertension    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 per pt-dr levy also II   Kidney transplant recipient    Neuromuscular disorder ([place])    NEUROPATHY   Sleep apnea     Social History   Tobacco Use   Smoking status: Never   Smokeless tobacco: Never  Vaping Use   Vaping Use: Never used  Substance Use Topics   Alcohol use: Yes    Comment: Socially   Drug use: No    Family History  Problem Relation Age of Onset   Diabetes Mother    Kidney disease Mother    Diabetes Father    Heart disease Father    Kidney disease Father    Hypertension Father    Diabetes Sister    Asthma Sister     Lupus Sister    Diabetes Brother    Diabetes Brother    Diabetes Brother    Diabetes Brother    Cancer Maternal Grandmother 6       ovarian cancer   Diabetes Maternal Grandfather    Diabetes Paternal Grandmother    Colon cancer Neg Hx    Colon polyps Neg Hx    Esophageal cancer Neg Hx    Rectal cancer Neg Hx    Stomach cancer Neg Hx  Allergies  Allergen Reactions   Other Rash    Burns skin.  Please use paper tape only Burns skin.  Please use paper tape only   Docetaxel Nausea Only    Diaphoretic.  Patient had hypersensitivity reaction to docetaxel.  See progress note from 05/24/2021.  Patient able to complete infusion.   Norvasc [Amlodipine] Swelling   Tape Other (See Comments)    Burns skin.  Please use paper tape only    Health Maintenance  Topic Date Due   COVID-19 Vaccine (1) Never done   Pneumococcal Vaccine 66-37 Years old (1 - PCV) Never done   OPHTHALMOLOGY EXAM  Never done   URINE MICROALBUMIN  Never done   Hepatitis C Screening  Never done   TETANUS/TDAP  Never done   Zoster Vaccines- Shingrix (1 of 2) Never done   PAP SMEAR-Modifier  11/25/2015   INFLUENZA VACCINE  01/23/2021   FOOT EXAM  11/03/2021   HEMOGLOBIN A1C  11/27/2021   MAMMOGRAM  02/08/2023   COLONOSCOPY (Pts 45-30yr Insurance coverage will need to be confirmed)  11/10/2026   HIV Screening  Completed   HPV VACCINES  Aged Out    Objective: BP 120/72    Pulse 85    Temp 97.6 F (36.4 C)    Resp 16    Ht '5\' 1"'  (1.549 m)    Wt 203 lb (92.1 kg)    SpO2 98%    BMI 38.36 kg/m    Physical Exam Constitutional:      Appearance: Normal appearance.  HENT:     Head: Normocephalic and atraumatic.      Mouth: Mucous membranes are moist. No oral ulcers Eyes:    Conjunctiva/sclera: Conjunctivae normal.     Pupils: Pupils are equal, round, and reactive to light.   Cardiovascular:     Rate and Rhythm: Normal rate and regular rhythm.     Heart sounds: No murmur heard. No friction rub. No gallop.    Pulmonary:     Effort: Pulmonary effort is normal.     Breath sounds: Normal breath sounds.   Abdominal:     General:     Palpations: Abdomen is soft. Non tender   Musculoskeletal:        General: Normal range of motion.   Skin:    General: Skin is warm and dry.     Comments:  Neurological:     General: No focal deficit present.     Mental Status: She is alert and oriented to person, place, and time.   Psychiatric:        Mood and Affect: Mood normal.   Lab Results Lab Results  Component Value Date   WBC 32.4 (H) 06/02/2021   HGB 10.1 (L) 06/02/2021   HCT 31.0 (L) 06/02/2021   MCV 90.6 06/02/2021   PLT 190 06/02/2021    Lab Results  Component Value Date   CREATININE 1.26 (H) 06/01/2021   BUN 29 (H) 06/01/2021   NA 131 (L) 06/01/2021   K 4.7 06/01/2021   CL 99 06/01/2021   CO2 26 06/01/2021    Lab Results  Component Value Date   ALT 26 05/27/2021   AST 14 (L) 05/27/2021   ALKPHOS 114 05/27/2021   BILITOT 0.7 05/27/2021    Lab Results  Component Value Date   CHOL 162 03/29/2014   HDL 31 (L) 03/29/2014   LDLCALC 74 03/29/2014   TRIG 284 (H) 03/29/2014   CHOLHDL 5.2 03/29/2014   No results  found for: LABRPR, RPRTITER No results found for: HIV1RNAQUANT, HIV1RNAVL, CD4TABS   Problem List Items Addressed This Visit       Genitourinary   Complicated UTI (urinary tract infection)     Other   Kidney transplant status   Leukocytosis - Primary   Relevant Orders   CBC w/Diff   PICC (peripherally inserted central catheter) in place     Assessment/Plan  # Leukocytosis:   # E. coli bacteremia, ESBL secondary to complicated UTI - US kidneys unremarkable for acute changes    # Breast cancer status post lumpectomy and on chemotherapy # DD KT in 2017 on immunosuppression(tacrolimus, prednisone and mycophenolate) with CKD # DM, uncontrolled-last A1c 11.9  DC IV ertapenem, has completed adequate tx for Complicated UTI  Will coordinate with [facility] for PICC  removal CBC to monitor leukocytosis.  FU as needed Fu with Oncology for breast ca  I spent more than 40 minutes for this patient encounter including reviewing data/chart, and coordinating care and >50% direct face to face time providing counseling/discussing diagnostics/treatment plan with patient  Wilber Oliphant, [place] [facility] 06/13/2021, 2:40 PM

## 2021-06-14 ENCOUNTER — Ambulatory Visit
Admission: RE | Admit: 2021-06-14 | Discharge: 2021-06-14 | Disposition: A | Discharge status: Home / Self Care | Payer: Self-pay | Source: Ambulatory Visit | Attending: Radiation Oncology | Admitting: Radiation Oncology

## 2021-06-14 ENCOUNTER — Ambulatory Visit: Payer: Medicare HMO

## 2021-06-14 ENCOUNTER — Other Ambulatory Visit: Payer: Self-pay | Admitting: Radiation Oncology

## 2021-06-14 ENCOUNTER — Other Ambulatory Visit: Payer: Medicare HMO

## 2021-06-14 ENCOUNTER — Ambulatory Visit: Payer: Medicare HMO | Admitting: Nurse Practitioner

## 2021-06-14 ENCOUNTER — Inpatient Hospital Stay
Admission: RE | Admit: 2021-06-14 | Discharge: 2021-06-14 | Disposition: A | Discharge status: Home / Self Care | Payer: Self-pay | Source: Ambulatory Visit | Attending: Radiation Oncology | Admitting: Radiation Oncology

## 2021-06-14 DIAGNOSIS — C50312 Malignant neoplasm of lower-inner quadrant of left female breast: Secondary | ICD-10-CM

## 2021-06-14 NOTE — Telephone Encounter (Signed)
[employer] FMLA for Marji Kuehnel significant other successfully faxed to [phone] at 1659.    Spoke with "Al" about original copy. Request to pick-up tomorrow.  Form at [place] file for patient pick-up.

## 2021-06-16 ENCOUNTER — Ambulatory Visit: Payer: Medicare HMO

## 2021-06-20 ENCOUNTER — Other Ambulatory Visit: Payer: Self-pay

## 2021-06-20 ENCOUNTER — Inpatient Hospital Stay: Payer: Medicare HMO

## 2021-06-20 DIAGNOSIS — Z5111 Encounter for antineoplastic chemotherapy: Secondary | ICD-10-CM | POA: Insufficient documentation

## 2021-06-20 DIAGNOSIS — R350 Frequency of micturition: Secondary | ICD-10-CM | POA: Diagnosis not present

## 2021-06-20 DIAGNOSIS — C50312 Malignant neoplasm of lower-inner quadrant of left female breast: Secondary | ICD-10-CM | POA: Diagnosis present

## 2021-06-20 DIAGNOSIS — Z170 Estrogen receptor positive status [ER+]: Secondary | ICD-10-CM | POA: Insufficient documentation

## 2021-06-20 DIAGNOSIS — Z5189 Encounter for other specified aftercare: Secondary | ICD-10-CM | POA: Diagnosis not present

## 2021-06-20 LAB — URINALYSIS, COMPLETE (UACMP) WITH MICROSCOPIC
Bilirubin Urine: NEGATIVE
Glucose, UA: NEGATIVE mg/dL
Ketones, ur: NEGATIVE mg/dL
Nitrite: POSITIVE — AB
Protein, ur: 30 mg/dL — AB
Specific Gravity, Urine: 1.025 (ref 1.005–1.030)
WBC, UA: >50 WBC/hpf — ABNORMAL HIGH (ref 0–5)
pH: 6 (ref 5.0–8.0)

## 2021-06-20 LAB — CMP (CANCER CENTER ONLY)
ALT: 23 U/L (ref 0–44)
AST: 18 U/L (ref 15–41)
Albumin: 3.9 g/dL (ref 3.5–5.0)
Alkaline Phosphatase: 86 U/L (ref 38–126)
Anion gap: 6 (ref 5–15)
BUN: 21 mg/dL — ABNORMAL HIGH (ref 6–20)
CO2: 28 mmol/L (ref 22–32)
Calcium: 10.4 mg/dL — ABNORMAL HIGH (ref 8.9–10.3)
Chloride: 106 mmol/L (ref 98–111)
Creatinine: 1.29 mg/dL — ABNORMAL HIGH (ref 0.44–1.00)
GFR, Estimated: 50 mL/min — ABNORMAL LOW (ref >60)
Glucose, Bld: 114 mg/dL — ABNORMAL HIGH (ref 70–99)
Potassium: 4.5 mmol/L (ref 3.5–5.1)
Sodium: 140 mmol/L (ref 135–145)
Total Bilirubin: 0.4 mg/dL (ref 0.3–1.2)
Total Protein: 6.2 g/dL — ABNORMAL LOW (ref 6.5–8.1)

## 2021-06-20 LAB — CBC WITH DIFFERENTIAL (CANCER CENTER ONLY)
Abs Immature Granulocytes: 0.04 10*3/uL (ref 0.00–0.07)
Basophils Absolute: 0.1 10*3/uL (ref 0.0–0.1)
Basophils Relative: 2 %
Eosinophils Absolute: 0.3 10*3/uL (ref 0.0–0.5)
Eosinophils Relative: 4 %
HCT: 34.2 % — ABNORMAL LOW (ref 36.0–46.0)
Hemoglobin: 10.9 g/dL — ABNORMAL LOW (ref 12.0–15.0)
Immature Granulocytes: 1 %
Lymphocytes Relative: 22 %
Lymphs Abs: 1.5 10*3/uL (ref 0.7–4.0)
MCH: 30.2 pg (ref 26.0–34.0)
MCHC: 31.9 g/dL (ref 30.0–36.0)
MCV: 94.7 fL (ref 80.0–100.0)
Monocytes Absolute: 0.6 10*3/uL (ref 0.1–1.0)
Monocytes Relative: 10 %
Neutro Abs: 4 10*3/uL (ref 1.7–7.7)
Neutrophils Relative %: 61 %
Platelet Count: 198 10*3/uL (ref 150–400)
RBC: 3.61 MIL/uL — ABNORMAL LOW (ref 3.87–5.11)
RDW: 15.5 % (ref 11.5–15.5)
WBC Count: 6.6 10*3/uL (ref 4.0–10.5)
nRBC: 0 % (ref 0.0–0.2)

## 2021-06-21 ENCOUNTER — Inpatient Hospital Stay (HOSPITAL_BASED_OUTPATIENT_CLINIC_OR_DEPARTMENT_OTHER): Payer: Medicare HMO | Admitting: Hematology

## 2021-06-21 ENCOUNTER — Encounter: Payer: Self-pay | Admitting: *Deleted

## 2021-06-21 ENCOUNTER — Inpatient Hospital Stay: Payer: Medicare HMO

## 2021-06-21 VITALS — BP 158/68 | HR 86 | Temp 98.0°F | Resp 18 | Wt 202.0 lb

## 2021-06-21 DIAGNOSIS — C50312 Malignant neoplasm of lower-inner quadrant of left female breast: Secondary | ICD-10-CM

## 2021-06-21 DIAGNOSIS — Z170 Estrogen receptor positive status [ER+]: Secondary | ICD-10-CM

## 2021-06-21 DIAGNOSIS — Z5111 Encounter for antineoplastic chemotherapy: Secondary | ICD-10-CM | POA: Diagnosis not present

## 2021-06-21 MED ORDER — LORAZEPAM 2 MG/ML IJ SOLN
0.5000 mg | Freq: Once | INTRAMUSCULAR | Status: AC
  Administered 2021-06-21: 09:00:00 0.5 mg via INTRAVENOUS
  Filled 2021-06-21: qty 1

## 2021-06-21 MED ORDER — SODIUM CHLORIDE 0.9 % IV SOLN
150.0000 mg | Freq: Once | INTRAVENOUS | Status: AC
  Administered 2021-06-21: 08:00:00 150 mg via INTRAVENOUS
  Filled 2021-06-21: qty 150
  Filled 2021-06-21: qty 5

## 2021-06-21 MED ORDER — FAMOTIDINE 20 MG IN NS 100 ML IVPB
20.0000 mg | Freq: Once | INTRAVENOUS | Status: AC
  Administered 2021-06-21: 09:00:00 20 mg via INTRAVENOUS
  Filled 2021-06-21: qty 100

## 2021-06-21 MED ORDER — SODIUM CHLORIDE 0.9 % IV SOLN
60.0000 mg/m2 | Freq: Once | INTRAVENOUS | Status: AC
  Administered 2021-06-21: 10:00:00 120 mg via INTRAVENOUS
  Filled 2021-06-21: qty 12

## 2021-06-21 MED ORDER — PROCHLORPERAZINE EDISYLATE 10 MG/2ML IJ SOLN
10.0000 mg | Freq: Once | INTRAMUSCULAR | Status: AC
  Administered 2021-06-21: 11:00:00 10 mg via INTRAVENOUS
  Filled 2021-06-21: qty 2

## 2021-06-21 MED ORDER — SODIUM CHLORIDE 0.9 % IV SOLN: Freq: Once | INTRAVENOUS | Status: AC

## 2021-06-21 MED ORDER — PALONOSETRON HCL INJECTION 0.25 MG/5ML
0.2500 mg | Freq: Once | INTRAVENOUS | Status: AC
  Administered 2021-06-21: 09:00:00 0.25 mg via INTRAVENOUS
  Filled 2021-06-21: qty 5

## 2021-06-21 MED ORDER — SODIUM CHLORIDE 0.9 % IV SOLN
10.0000 mg | Freq: Once | INTRAVENOUS | Status: AC
  Administered 2021-06-21: 08:00:00 10 mg via INTRAVENOUS
  Filled 2021-06-21: qty 1
  Filled 2021-06-21: qty 10

## 2021-06-21 MED ORDER — SODIUM CHLORIDE 0.9 % IV SOLN
500.0000 mg/m2 | Freq: Once | INTRAVENOUS | Status: AC
  Administered 2021-06-21: 12:00:00 1000 mg via INTRAVENOUS
  Filled 2021-06-21: qty 50

## 2021-06-21 MED ORDER — NITROFURANTOIN MONOHYD MACRO 100 MG PO CAPS: 100.0000 mg | ORAL_CAPSULE | Freq: Two times a day (BID) | ORAL | 0 refills | Status: DC

## 2021-06-21 NOTE — Progress Notes (Signed)
[place]   [phone] [phone]   Clinic Follow up Note   Patient Care Team: Arthur Holms, NP as PCP - General (Nurse Practitioner) Buford Dresser, MD as PCP - Cardiology (Cardiology) Rexene Agent, MD as Consulting Physician (Nephrology) Dwana Melena, MD as Referring Physician (Nephrology) Rockwell Germany, RN as Oncology Nurse Navigator Mauro Kaufmann, RN as Oncology Nurse Navigator Truitt Merle, MD as Consulting Physician (Hematology) Erroll Luna, MD as Consulting Physician (General Surgery) Eppie Gibson, MD as Attending Physician (Radiation Oncology)  Date of Service:  06/21/2021  CHIEF COMPLAINT: f/u of left breast cancer  CURRENT THERAPY:  Adjuvant TC q21 days x4 cycles starting 05/02/21  ASSESSMENT & PLAN:  Cynthia [name] is a 50 y.o. female with   1. 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 Brantley Stage 03/02/2021, surgical path showed invasive and in situ ductal carcinoma, grade 2, node-negative with close but clear margins.  ER/PR positive, HER2 negative, Ki-67 of 20%.  Oncotype showed high risk with recurrence score 26 which predicts 9-year distant recurrence after 5 years of tamoxifen at 16% -She began adjuvant TC on 05/02/21 after delayed wound healing. She tolerated C1 poorly with nausea with vomiting and diarrhea. She was able to recover well.  -following C2, she developed acute cystitis and bacteriemia and was hospitalized 12/4-12/10. She reports she has been able to recover well. -labs reviewed, adequate to proceed with C3 today, will continue the same dose reduction due to her poor tolerance -will add iv emend and ativan before chemo for nausea  -last cycle in 3 weeks   3. Recurrent UTI -recently hospitalized for ESBL E. coli UTI and bacteremia, treated with IV antibiotics -She now has recurrent dysuria for 2  to 3 days, no fever or sepsis.  UA yesterday was positive, culture still pending.  I reviewed her previous culture results, and discussed with on-call ID doctor, who recommends macrobid for 5 days which is sensitive for her previous e coli    3. Symptom Management: nausea with vomiting, diarrhea -secondary to TC, developed 4 days after infusion -nausea/vomiting and diarrhea resolved. -I will prescribe lomotil for her to use as needed    4. Comorbidities: DM with neuropathy, HTN, s/p kidney transplant -continue medications and f/u -she has diabetic neuropathy at baseline, on gabapentin -On prednisone and tacrolimus following right kidney transplant in 2017.   5. BRCA2 + pathogenic mutation  -Found on BRCA1/2 analysis and [facility] genetics panel from 02/22/21 -We previously reviewed the increased risk of recurrent breast and or contralateral breast cancer, ovarian cancer, pancreatic cancer, and melanoma -She is reluctant to consider another breast surgery at this time given her delayed healing from lumpectomy.  If she is not interested in risk reducing mastectomy, we would likely recommend close surveillance with annual mammogram and screening breast MRI in the future -She is s/p hysterectomy in 2001 at which time she became menopausal, ovaries remain.  She is open to oophorectomy, plan to refer her to gyn onc after she completes chemo to discuss screening vs risk reducing surgery. If she does not proceed with surgery soon we may start her on ovarian suppression after chemo -she has no family history of pancreatic cancer, we reviewed screening options. I will likely refer her to GI for discussion of EUS vs MRI and tumor markers after chemo -she has no personal h/o skin cancer, I will refer her  to derm for screening after chemo -her 3 daughters have been tested and all are negative.     PLAN: -proceed with C3 TC today at same reduced dose.  -Udenyca on day 3 -we will see her back in one week with  lab and f/u. -I called in microbid 149m bid for 5 days for her UTI, and will f/u her urine culture  -she is scheduled for re-evaluation with Dr. SIsidore Mooson 07/19/21.   No problem-specific Assessment & Plan notes found for this encounter.   SUMMARY OF ONCOLOGIC HISTORY: Oncology History Overview Note  Cancer Staging Malignant neoplasm of lower-inner quadrant of left breast in female, estrogen receptor positive ([facility] Staging form: Breast, AJCC 8th Edition - Clinical stage from 02/09/2021: Stage IA (cT1c, cN0, cM0, G2, ER+, PR+, HER2-) - Signed by FTruitt Merle MD on 02/20/2021 Stage prefix: Initial diagnosis Histologic grading system: 3 grade system - Pathologic stage from 03/02/2021: Stage IA (pT2, pN0, cM0, G2, ER+, PR+, HER2-, Oncotype DX score: 26) - Signed by FTruitt Merle MD on 03/21/2021 Stage prefix: Initial diagnosis Multigene prognostic tests performed: Oncotype DX Recurrence score range: Greater than or equal to 11 Histologic grading system: 3 grade system Residual tumor (R): R0 - None    Malignant neoplasm of lower-inner quadrant of left breast in female, estrogen receptor positive ([place]  02/07/2021 Mammogram   Exam: Left Diagnostic Mammogram; Left Breast Ultrasound  IMPRESSION: Irregular mass in left breast at 6:30, measuring 1.6 cm by mammogram, is highly suggestive of malignancy. Left axillary ultrasound is negative for lymphadenopathy.   02/09/2021 Cancer Staging   Staging form: Breast, AJCC 8th Edition - Clinical stage from 02/09/2021: Stage IA (cT1c, cN0, cM0, G2, ER+, PR+, HER2-) - Signed by FTruitt Merle MD on 02/20/2021 Stage prefix: Initial diagnosis Histologic grading system: 3 grade system    02/09/2021 Pathology Results   Diagnosis Breast, left, needle core biopsy, 6:30 o'clock 8cm fn - INVASIVE DUCTAL CARCINOMA WITH HISTIOCYTOID FEATURES. SEE NOTE Diagnosis Note Carcinoma measures 1 cm in greatest linear dimension and appears grade 2.  PROGNOSTIC  INDICATORS Results: IMMUNOHISTOCHEMICAL AND MORPHOMETRIC ANALYSIS PERFORMED MANUALLY The tumor cells are EQUIVOCAL for Her2 (2+). Her2 by FISH will be performed and results performed separately. Estrogen Receptor: >95%, POSITIVE, STRONG STAINING INTENSITY Progesterone Receptor: 90%, POSITIVE, STRONG STAINING INTENSITY Proliferation Marker Ki67: 20%  FLUORESCENCE IN-SITU HYBRIDIZATION Results: GROUP 5: HER2 **NEGATIVE** Equivocal form of amplification of the HER2 gene was detected in the IHC 2+ tissue sample received from this individual. HER2 FISH was performed by a technologist and cell imaging and analysis on the [employer]. RATIO OF HER2/CEN17 SIGNALS 1.30 AVERAGE HER2 COPY NUMBER PER CELL 1.75   02/15/2021 Initial Diagnosis   Malignant neoplasm of lower-inner quadrant of left breast in female, estrogen receptor positive ([facility]   03/01/2021 Genetic Testing   Positive genetic testing: pathogenic variant detected in BRCA2 at [id number]  No other pathogenic variants detected in [facility] Custom[employer] Panel.  The report date is 03/19/2021.  The CustomNext-Cancer+RNAinsight panel offered by [employer] sequencing and rearrangement analysis for the following 47 genes:  APC, ATM, AXIN2, BARD1, BMPR1A, BRCA1, BRCA2, BRIP1, CDH1, CDK4, CDKN2A, CHEK2, DICER1, EPCAM, GREM1, HOXB13, MEN1, MLH1, MSH2, MSH3, MSH6, MUTYH, NBN, NF1, NF2, NTHL1, PALB2, PMS2, POLD1, POLE, PTEN, RAD51C, RAD51D, RECQL, RET, SDHA, SDHAF2, SDHB, SDHC, SDHD, SMAD4, SMARCA4, STK11, TP53, TSC1, TSC2, and VHL.  RNA data is routinely analyzed for use in variant interpretation for all genes.   03/02/2021 Cancer Staging   Staging form: Breast,  AJCC 8th Edition - Pathologic stage from 03/02/2021: Stage IA (pT2, pN0, cM0, G2, ER+, PR+, HER2-, Oncotype DX score: 26) - Signed by Truitt Merle, MD on 03/21/2021 Stage prefix: Initial diagnosis Multigene prognostic tests performed: Oncotype DX Recurrence score range: Greater than or equal  to 11 Histologic grading system: 3 grade system Residual tumor (R): R0 - None    05/02/2021 -  Chemotherapy   Patient is on Treatment Plan : BREAST TC q21d        INTERVAL HISTORY:  Cynthia [name] is here for a follow up of breast cancer. She was last seen by me on 05/24/21. She was seen in the infusion area. She reports she is having recurrent dysuria, which began about 2 days ago. She endorses completing the antibiotics given to her at discharge from the hospital prior to her symptoms beginning.   All other systems were reviewed with the patient and are negative.  MEDICAL HISTORY:  Past Medical History:  Diagnosis Date   Allergy    pollen   Anemia    when on dialysis   Anxiety    BRCA2 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 03/01/2021   Breast cancer [facility])    Cataract    surgical repair bilateral   Depression    ESRD on hemodialysis ([place])    Home HD 5x per week- not on dialysis now had tramsplant 4/17   Family history of ovarian cancer 02/22/2021   GERD (gastroesophageal reflux disease)    Hearing loss 2017   right ear   History of blood transfusion    transfusion reaction   Hyperlipidemia    Hypertension    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 per pt-dr levy also II   Kidney transplant recipient    Neuromuscular disorder ([place])    NEUROPATHY   Sleep apnea     SURGICAL HISTORY: Past Surgical History:  Procedure Laterality Date   ABDOMINAL HYSTERECTOMY     BREAST EXCISIONAL BIOPSY Right 08/2015   BREAST LUMPECTOMY WITH RADIOACTIVE SEED LOCALIZATION Right 09/14/2015   Procedure: RIGHT BREAST LUMPECTOMY WITH RADIOACTIVE SEED LOCALIZATION;  Surgeon: Donnie Mesa, MD;  Location: [place];  Service: General;  Laterality: Right;   BREAST LUMPECTOMY WITH RADIOACTIVE SEED LOCALIZATION Left 03/02/2021   Procedure: LEFT BREAST SEED LUMPECTOMY LEFT SENTINEL LYMPH NODE [place];  Surgeon: Erroll Luna, MD;   Location: [facility];  Service: General;  Laterality: Left;  GEN AND PEC BLOCK   BREAST SURGERY Bilateral    biopsy bilateral   CATARACT EXTRACTION Bilateral    bilateral   CHOLECYSTECTOMY     CYST REMOVAL NECK     DIALYSIS FISTULA CREATION Left    EYE SURGERY Bilateral    lazer   LEFT HEART CATH AND CORONARY ANGIOGRAPHY N/A 03/04/2019   Procedure: LEFT HEART CATH AND CORONARY ANGIOGRAPHY;  Surgeon: Martinique, Peter M, MD;  Location: [place] CV LAB;  Service: Cardiovascular;  Laterality: N/A;   LEFT HEART CATHETERIZATION WITH CORONARY ANGIOGRAM N/A 03/30/2014   Procedure: LEFT HEART CATHETERIZATION WITH CORONARY ANGIOGRAM;  Surgeon: Sinclair Grooms, MD;  Location: [facility] CATH LAB;  Service: Cardiovascular;  Laterality: N/A;   LIPOMA EXCISION N/A 02/06/2017   Procedure: EXCISION POSTERIOR NECK SEBACEOUS CYST;  Surgeon: Coralie Keens, MD;  Location: [place];  Service: General;  Laterality: N/A;   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   Procedure: REPAIR OF ARTERIOVENOUS FISTULA ANEURYSM;  Surgeon: Durene Fruits  Pierre Bali, MD;  Location: [facility] OR;  Service: Vascular;  Laterality: Left;   REVISON OF ARTERIOVENOUS FISTULA Left 09/28/2013   Procedure: EXCISE ESCHAR LEFT ARM  ARTERIOVENOUS FISTULA WITH PLICATION OF LEFT ARM ARTERIOVENOUS FISTULA;  Surgeon: Elam Dutch, MD;  Location: [facility] [place];  Service: Vascular;  Laterality: Left;   REVISON OF ARTERIOVENOUS FISTULA Left 08/26/2015   Procedure: RESECTION ANEURYSM OF LEFT ARM ARTERIOVENOUS FISTULA  ;  Surgeon: Serafina Mitchell, MD;  Location: [facility] OR;  Service: Vascular;  Laterality: Left;   TUBAL LIGATION      I have reviewed the social history and family history with the patient and they are unchanged from previous note.  ALLERGIES:  is allergic to other, docetaxel, norvasc [amlodipine], and tape.  MEDICATIONS:  Current Outpatient Medications  Medication Sig Dispense Refill   nitrofurantoin, macrocrystal-monohydrate,  (MACROBID) 100 MG capsule Take 1 capsule (100 mg total) by mouth 2 (two) times daily. 10 capsule 0   Accu-Chek Softclix Lancets lancets Use as instructed 100 each 12   acetaminophen (TYLENOL) 500 MG tablet Take 500 mg by mouth every 6 (six) hours as needed for mild pain, fever or headache.     aspirin EC 81 MG EC tablet Take 1 tablet (81 mg total) by mouth daily. (Patient taking differently: Take 81 mg by mouth at bedtime.) 90 tablet 3   Blood Glucose Monitoring Suppl (ACCU-CHEK AVIVA CONNECT) w/Device KIT Check sugar 1x daily 1 kit 0   carvedilol (COREG) 25 MG tablet Take 12.5 mg by mouth 2 (two) times daily with a meal.      dexamethasone (DECADRON) 4 MG tablet Take 2 tablets (8 mg total) by mouth 2 (two) times daily. Start the day before Taxotere. Then again the day after chemo for 3 days. 30 tablet 1   escitalopram (LEXAPRO) 10 MG tablet Take 10 mg by mouth daily.     gabapentin (NEURONTIN) 600 MG tablet Take 600 mg by mouth daily.     glucose blood (ACCU-CHEK AVIVA PLUS) test strip Check sugar 1x daily 100 each 12   insulin glargine (LANTUS SOLOSTAR) 100 UNIT/ML Solostar Pen Inject 25 Units into the skin 2 (two) times daily. 75 mL PRN   Insulin Pen Needle (PEN NEEDLES) 30G X 8 MM MISC 1 each by Does not apply route daily. E11.9 90 each 0   mycophenolate (MYFORTIC) 180 MG EC tablet Take 360 mg by mouth 2 (two) times daily.     NOVOLOG FLEXPEN 100 UNIT/ML FlexPen Inject 5 Units into the skin 3 (three) times daily.     ondansetron (ZOFRAN) 8 MG tablet Take 1 tablet (8 mg total) by mouth 2 (two) times daily as needed for refractory nausea / vomiting. Start on day 3 after chemo. 30 tablet 1   pantoprazole (PROTONIX) 40 MG tablet Take 40 mg by mouth daily.  5   predniSONE (DELTASONE) 5 MG tablet Take 5 mg by mouth daily.   2   prochlorperazine (COMPAZINE) 10 MG tablet Take 1 tablet (10 mg total) by mouth every 6 (six) hours as needed (Nausea or vomiting). 30 tablet 1   rosuvastatin (CRESTOR) 5 MG  tablet Take 5 mg by mouth at bedtime.     tacrolimus (PROGRAF) 1 MG capsule Take 3 mg by mouth daily.     valACYclovir (VALTREX) 1000 MG tablet Take 1 tablet (1,000 mg total) by mouth 2 (two) times daily. 16 tablet 0   No current facility-administered medications for this visit.    PHYSICAL  EXAMINATION: ECOG PERFORMANCE STATUS: 1 - Symptomatic but completely ambulatory  There were no vitals filed for this visit. Wt Readings from Last 3 Encounters:  06/21/21 202 lb (91.6 kg)  06/13/21 203 lb (92.1 kg)  06/05/21 207 lb 8 oz (94.1 kg)     GENERAL:alert, no distress and comfortable SKIN: skin color normal, no rashes or significant lesions EYES: normal, Conjunctiva are pink and non-injected, sclera clear  NEURO: alert & oriented x 3 with fluent speech  LABORATORY DATA:  I have reviewed the data as listed CBC Latest Ref Rng & Units 06/20/2021 06/13/2021 06/02/2021  WBC 4.0 - 10.5 K/uL 6.6 6.2 32.4(H)  Hemoglobin 12.0 - 15.0 g/dL 10.9(L) 10.8(L) 10.1(L)  Hematocrit 36.0 - 46.0 % 34.2(L) 33.7(L) 31.0(L)  Platelets 150 - 400 K/uL 198 258 190     CMP Latest Ref Rng & Units 06/20/2021 06/01/2021 05/31/2021  Glucose 70 - 99 mg/dL 114(H) 224(H) 200(H)  BUN 6 - 20 mg/dL 21(H) 29(H) 32(H)  Creatinine 0.44 - 1.00 mg/dL 1.29(H) 1.26(H) 1.28(H)  Sodium 135 - 145 mmol/L 140 131(L) 129(L)  Potassium 3.5 - 5.1 mmol/L 4.5 4.7 4.7  Chloride 98 - 111 mmol/L 106 99 98  CO2 22 - 32 mmol/L '28 26 25  ' Calcium 8.9 - 10.3 mg/dL 10.4(H) 9.3 9.1  Total Protein 6.5 - 8.1 g/dL 6.2(L) - -  Total Bilirubin 0.3 - 1.2 mg/dL 0.4 - -  Alkaline Phos 38 - 126 U/L 86 - -  AST 15 - 41 U/L 18 - -  ALT 0 - 44 U/L 23 - -      RADIOGRAPHIC STUDIES: I have personally reviewed the radiological images as listed and agreed with the findings in the report. No results found.    No orders of the defined types were placed in this encounter.  All questions were answered. The patient knows to call the clinic with any  problems, questions or concerns. No barriers to learning was detected. The total time spent in the appointment was 30 minutes.     Truitt Merle, MD 06/21/2021   I, Wilburn Mylar, am acting as scribe for Truitt Merle, MD.   I have reviewed the above documentation for accuracy and completeness, and I agree with the above.

## 2021-06-21 NOTE — Progress Notes (Signed)
Patient with infusion complaints on 05/24/21, received additional medications.  Dr Burr Medico would like to add Pepcid 20 mg ivpb and ativan 0.5 mg IVpush to the treatment plans as pre-medications.  Orders updated.  T.O. Dr Lavonda Jumbo, PharmD

## 2021-06-21 NOTE — Progress Notes (Signed)
Patient reports burning with urination. Dr Burr Medico made aware and is scheduled to see patient today.  During infusion of Docetaxel, patient c/o nausea. Infusion paused. Dr Burr Medico made aware and verbal order was given for IV compazine. Compazine administered and patient reported nausea was subsiding. Infusion restarted.

## 2021-06-21 NOTE — Patient Instructions (Signed)
[facility] ONCOLOGY   Discharge Instructions: Thank you for choosing [facility] to provide your oncology and hematology care.   If you have a lab appointment with the [facility], please go directly to the [facility] and check in at the registration area.   Wear comfortable clothing and clothing appropriate for easy access to any Portacath or PICC line.   We strive to give you quality time with your provider. You may need to reschedule your appointment if you arrive late (15 or more minutes).  Arriving late affects you and other patients whose appointments are after yours.  Also, if you miss three or more appointments without notifying the office, you may be dismissed from the clinic at the providers discretion.      For prescription refill requests, have your pharmacy contact our office and allow 72 hours for refills to be completed.    Today you received the following chemotherapy and/or immunotherapy agents: docetaxel and cyclophosphamide      To help prevent nausea and vomiting after your treatment, we encourage you to take your nausea medication as directed.  BELOW ARE SYMPTOMS THAT SHOULD BE REPORTED IMMEDIATELY: *FEVER GREATER THAN 100.4 F (38 C) OR HIGHER *CHILLS OR SWEATING *NAUSEA AND VOMITING THAT IS NOT CONTROLLED WITH YOUR NAUSEA MEDICATION *UNUSUAL SHORTNESS OF BREATH *UNUSUAL BRUISING OR BLEEDING *URINARY PROBLEMS (pain or burning when urinating, or frequent urination) *BOWEL PROBLEMS (unusual diarrhea, constipation, pain near the anus) TENDERNESS IN MOUTH AND THROAT WITH OR WITHOUT PRESENCE OF ULCERS (sore throat, sores in mouth, or a toothache) UNUSUAL RASH, SWELLING OR PAIN  UNUSUAL VAGINAL DISCHARGE OR ITCHING   Items with * indicate a potential emergency and should be followed up as soon as possible or go to the Emergency Department if any problems should occur.  Please show the CHEMOTHERAPY ALERT CARD or IMMUNOTHERAPY  ALERT CARD at check-in to the Emergency Department and triage nurse.  Should you have questions after your visit or need to cancel or reschedule your appointment, please contact [facility]  Dept: [phone]  and follow the prompts.  Office hours are 8:00 a.m. to 4:30 p.m. Monday - Friday. Please note that voicemails left after 4:00 p.m. may not be returned until the following business day.  We are closed weekends and major holidays. You have access to a nurse at all times for urgent questions. Please call the main number to the clinic Dept: [phone] and follow the prompts.   For any non-urgent questions, you may also contact your provider using MyChart. We now offer e-Visits for anyone 31 and older to request care online for non-urgent symptoms. For details visit [email or web address].   Also download the MyChart app! Go to the app store, search "MyChart", open the app, select [facility], and log in with your MyChart username and password.  Due to Covid, a mask is required upon entering the hospital/clinic. If you do not have a mask, one will be given to you upon arrival. For doctor visits, patients may have 1 support person aged 71 or older with them. For treatment visits, patients cannot have anyone with them due to current Covid guidelines and our immunocompromised population.

## 2021-06-22 LAB — URINE CULTURE: Culture: >=100000 — AB

## 2021-06-23 ENCOUNTER — Other Ambulatory Visit: Payer: Self-pay

## 2021-06-23 ENCOUNTER — Inpatient Hospital Stay: Payer: Medicare HMO

## 2021-06-23 VITALS — BP 152/67 | HR 80 | Temp 97.7°F | Resp 20

## 2021-06-23 DIAGNOSIS — Z5111 Encounter for antineoplastic chemotherapy: Secondary | ICD-10-CM | POA: Diagnosis not present

## 2021-06-23 DIAGNOSIS — C50312 Malignant neoplasm of lower-inner quadrant of left female breast: Secondary | ICD-10-CM

## 2021-06-23 MED ORDER — PEGFILGRASTIM-CBQV 6 MG/0.6ML ~~LOC~~ SOSY
6.0000 mg | PREFILLED_SYRINGE | Freq: Once | SUBCUTANEOUS | Status: AC
  Administered 2021-06-23: 10:00:00 6 mg via SUBCUTANEOUS
  Filled 2021-06-23: qty 0.6

## 2021-06-25 ENCOUNTER — Encounter: Payer: Self-pay | Admitting: Hematology

## 2021-06-26 NOTE — Progress Notes (Signed)
[place]   [phone] [phone]   Clinic Follow up Note   Patient Care Team: Arthur Holms, NP as PCP - General (Nurse Practitioner) Buford Dresser, MD as PCP - Cardiology (Cardiology) Rexene Agent, MD as Consulting Physician (Nephrology) Dwana Melena, MD as Referring Physician (Nephrology) Rockwell Germany, RN as Oncology Nurse Navigator Mauro Kaufmann, RN as Oncology Nurse Navigator Truitt Merle, MD as Consulting Physician (Hematology) Erroll Luna, MD as Consulting Physician (General Surgery) Eppie Gibson, MD as Attending Physician (Radiation Oncology)  Date of Service:  06/26/2021  CHIEF COMPLAINT: f/u of left breast cancer  CURRENT THERAPY:  Adjuvant TC q21 days x4 cycles starting 05/02/21  ASSESSMENT & PLAN:   Cynthia [name] is a 51 y.o. female with   1. 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 Brantley Stage 03/02/2021, surgical path showed invasive and in situ ductal carcinoma, grade 2, node-negative with close but clear margins.  ER/PR positive, HER2 negative, Ki-67 of 20%.  Oncotype showed high risk with recurrence score 26 which predicts 9-year distant recurrence after 5 years of tamoxifen at 16% -She began adjuvant TC on 05/02/21 after delayed wound healing. She tolerated C1 poorly with nausea with vomiting and diarrhea. Following C2, she developed acute cystitis and bacteriemia and was hospitalized 12/4-12/10. She just completed abx for UTI.  C3 tolerated well so far except for some fatigue and dizziness.  No urinary symptoms concerning for another urinary tract infection.  She will proceed with labs and follow-up prior to cycle 4 of TC.  She will proceed with adjuvant radiation following chemotherapy followed by antiestrogen therapy.  3. Recurrent UTI -recently hospitalized for ESBL E. coli UTI and bacteremia, treated  with IV antibiotics -No concerns for an active urinary tract infection at this time.  3. Symptom Management: nausea with vomiting, diarrhea -Grade 1 nausea controlled with as needed antinausea medication -Diarrhea, takes Lomotil twice a day.  4. Comorbidities: DM with neuropathy, HTN, s/p kidney transplant -continue medications and f/u  -she has diabetic neuropathy at baseline, on gabapentin -On prednisone and tacrolimus following right kidney transplant in 2017.   5. BRCA2 + pathogenic mutation  -Found on BRCA1/2 analysis and [facility] genetics panel from 02/22/21 -Dr. Jana Hakim has previously recommended role of bilateral mastectomy, salpingo-oophorectomy -She was not agreeable to bilateral mastectomy however she can consider it if she can undergo plastic surgery.  With regards to salpingo-oophorectomy, she is agreeable to seeing a gynecologist.  When she is done with adjuvant chemotherapy and radiation, we can refer her to breast surgery, plastic surgery as well as gynecology. She should report any symptoms concerning for pancreatic cancer however no family history of pancreatic cancer.  She is seeing dermatology annually.  All of these recommendations were previously discussed as well as today.   No problem-specific Assessment & Plan notes found for this encounter.   SUMMARY OF ONCOLOGIC HISTORY: Oncology History Overview Note  Cancer Staging Malignant neoplasm of lower-inner quadrant of left breast in female, estrogen receptor positive ([place]) Staging form: Breast, AJCC 8th Edition - Clinical stage from 02/09/2021: Stage IA (cT1c, cN0, cM0, G2, ER+, PR+, HER2-) - Signed by Truitt Merle, MD on 02/20/2021 Stage prefix: Initial diagnosis Histologic grading system: 3 grade system - Pathologic stage from 03/02/2021: Stage IA (pT2, pN0, cM0, G2, ER+, PR+, HER2-, Oncotype DX score: 26) - Signed by Truitt Merle, MD on 03/21/2021 Stage prefix: Initial  diagnosis Multigene prognostic tests performed: Oncotype  DX Recurrence score range: Greater than or equal to 11 Histologic grading system: 3 grade system Residual tumor (R): R0 - None    Malignant neoplasm of lower-inner quadrant of left breast in female, estrogen receptor positive ([place])  02/07/2021 Mammogram   Exam: Left Diagnostic Mammogram; Left Breast Ultrasound  IMPRESSION: Irregular mass in left breast at 6:30, measuring 1.6 cm by mammogram, is highly suggestive of malignancy. Left axillary ultrasound is negative for lymphadenopathy.   02/09/2021 Cancer Staging   Staging form: Breast, AJCC 8th Edition - Clinical stage from 02/09/2021: Stage IA (cT1c, cN0, cM0, G2, ER+, PR+, HER2-) - Signed by Truitt Merle, MD on 02/20/2021 Stage prefix: Initial diagnosis Histologic grading system: 3 grade system    02/09/2021 Pathology Results   Diagnosis Breast, left, needle core biopsy, 6:30 o'clock 8cm fn - INVASIVE DUCTAL CARCINOMA WITH HISTIOCYTOID FEATURES. SEE NOTE Diagnosis Note Carcinoma measures 1 cm in greatest linear dimension and appears grade 2.  PROGNOSTIC INDICATORS Results: IMMUNOHISTOCHEMICAL AND MORPHOMETRIC ANALYSIS PERFORMED MANUALLY The tumor cells are EQUIVOCAL for Her2 (2+). Her2 by FISH will be performed and results performed separately. Estrogen Receptor: >95%, POSITIVE, STRONG STAINING INTENSITY Progesterone Receptor: 90%, POSITIVE, STRONG STAINING INTENSITY Proliferation Marker Ki67: 20%  FLUORESCENCE IN-SITU HYBRIDIZATION Results: GROUP 5: HER2 **NEGATIVE** Equivocal form of amplification of the HER2 gene was detected in the IHC 2+ tissue sample received from this individual. HER2 FISH was performed by a technologist and cell imaging and analysis on the [employer]. RATIO OF HER2/CEN17 SIGNALS 1.30 AVERAGE HER2 COPY NUMBER PER CELL 1.75   02/15/2021 Initial Diagnosis   Malignant neoplasm of lower-inner quadrant of left breast in female, estrogen receptor positive ([place])   03/01/2021 Genetic Testing   Positive genetic  testing: pathogenic variant detected in BRCA2 at [id number].  No other pathogenic variants detected in [facility] Panel.  The report date is 03/19/2021.  The [facility]-Cancer+RNAinsight panel offered by [facility] includes sequencing and rearrangement analysis for the following 47 genes:  APC, ATM, AXIN2, BARD1, BMPR1A, BRCA1, BRCA2, BRIP1, CDH1, CDK4, CDKN2A, CHEK2, DICER1, EPCAM, GREM1, HOXB13, MEN1, MLH1, MSH2, MSH3, MSH6, MUTYH, NBN, NF1, NF2, NTHL1, PALB2, PMS2, POLD1, POLE, PTEN, RAD51C, RAD51D, RECQL, RET, SDHA, SDHAF2, SDHB, SDHC, SDHD, SMAD4, SMARCA4, STK11, TP53, TSC1, TSC2, and VHL.  RNA data is routinely analyzed for use in variant interpretation for all genes.   03/02/2021 Cancer Staging   Staging form: Breast, AJCC 8th Edition - Pathologic stage from 03/02/2021: Stage IA (pT2, pN0, cM0, G2, ER+, PR+, HER2-, Oncotype DX score: 26) - Signed by Truitt Merle, MD on 03/21/2021 Stage prefix: Initial diagnosis Multigene prognostic tests performed: Oncotype DX Recurrence score range: Greater than or equal to 11 Histologic grading system: 3 grade system Residual tumor (R): R0 - None    05/02/2021 -  Chemotherapy   Patient is on Treatment Plan : BREAST TC q21d        INTERVAL HISTORY:  Ramina Hulet is here for a follow up of breast cancer.   She arrived with a Spanish interpreter today.  She denies any hospitalization since last visit.  She complains of fatigue and dizziness, no urinary symptoms today.  No change in breathing, bowel habits.  She has about 5 bowel movements a Day and takes 2 Lomotil per Day.  She Complains of rare Nausea and Takes Occasional Antinausea Medication.  No Fevers or Chills.  No Tingling or Numbness or Neuropathy. Rest of the Pertinent 10 Point ROS Reviewed and Negative  MEDICAL HISTORY:  Past Medical History:  Diagnosis Date   Allergy    pollen   Anemia    when on dialysis   Anxiety    BRCA2 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 03/01/2021    Breast cancer [facility])    Cataract    surgical repair bilateral   Depression    ESRD on hemodialysis ([place])    Home HD 5x per week- not on dialysis now had tramsplant 4/17   Family history of ovarian cancer 02/22/2021   GERD (gastroesophageal reflux disease)    Hearing loss 2017   right ear   History of blood transfusion    transfusion reaction   Hyperlipidemia    Hypertension    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 per pt-dr levy also II   Kidney transplant recipient    Neuromuscular disorder ([place])    NEUROPATHY   Sleep apnea     SURGICAL HISTORY: Past Surgical History:  Procedure Laterality Date   ABDOMINAL HYSTERECTOMY     BREAST EXCISIONAL BIOPSY Right 08/2015   BREAST LUMPECTOMY WITH RADIOACTIVE SEED LOCALIZATION Right 09/14/2015   Procedure: RIGHT BREAST LUMPECTOMY WITH RADIOACTIVE SEED LOCALIZATION;  Surgeon: Donnie Mesa, MD;  Location: [place];  Service: General;  Laterality: Right;   BREAST LUMPECTOMY WITH RADIOACTIVE SEED LOCALIZATION Left 03/02/2021   Procedure: LEFT BREAST SEED LUMPECTOMY LEFT SENTINEL LYMPH NODE [place];  Surgeon: Erroll Luna, MD;  Location: [place];  Service: General;  Laterality: Left;  GEN AND PEC BLOCK   BREAST SURGERY Bilateral    biopsy bilateral   CATARACT EXTRACTION Bilateral    bilateral   CHOLECYSTECTOMY     CYST REMOVAL NECK     DIALYSIS FISTULA CREATION Left    EYE SURGERY Bilateral    lazer   LEFT HEART CATH AND CORONARY ANGIOGRAPHY N/A 03/04/2019   Procedure: LEFT HEART CATH AND CORONARY ANGIOGRAPHY;  Surgeon: Martinique, Peter M, MD;  Location: [place] [facility];  Service: Cardiovascular;  Laterality: N/A;   LEFT HEART CATHETERIZATION WITH CORONARY ANGIOGRAM N/A 03/30/2014   Procedure: LEFT HEART CATHETERIZATION WITH CORONARY ANGIOGRAM;  Surgeon: Sinclair Grooms, MD;  Location[facility] CATH [facility];  Service: Cardiovascular;  Laterality: N/A;   LIPOMA  EXCISION N/A 02/06/2017   Procedure: EXCISION POSTERIOR NECK SEBACEOUS CYST;  Surgeon: Coralie Keens, MD;  Location: [place];  Service: General;  Laterality: N/A;   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   Procedure: REPAIR OF ARTERIOVENOUS FISTULA ANEURYSM;  Surgeon: Serafina Mitchell, MD;  Location: [facility];  Service: Vascular;  Laterality: Left;   REVISON OF ARTERIOVENOUS FISTULA Left 09/28/2013   Procedure: EXCISE ESCHAR LEFT ARM  ARTERIOVENOUS FISTULA WITH PLICATION OF LEFT ARM ARTERIOVENOUS FISTULA;  Surgeon: Elam Dutch, MD;  Location: [place];  Service: Vascular;  Laterality: Left;   REVISON OF ARTERIOVENOUS FISTULA Left 08/26/2015   Procedure: RESECTION ANEURYSM OF LEFT ARM ARTERIOVENOUS FISTULA  ;  Surgeon: Serafina Mitchell, MD;  Location: [place];  Service: Vascular;  Laterality: Left;   TUBAL LIGATION      I have reviewed the social history and family history with the patient and they are unchanged from previous note.  ALLERGIES:  is allergic to other, docetaxel, norvasc [amlodipine], and tape.  MEDICATIONS:  Current Outpatient Medications  Medication Sig Dispense Refill   Accu-Chek Softclix Lancets lancets Use as instructed 100 each 12   acetaminophen (TYLENOL) 500 MG  tablet Take 500 mg by mouth every 6 (six) hours as needed for mild pain, fever or headache.     aspirin EC 81 MG EC tablet Take 1 tablet (81 mg total) by mouth daily. (Patient taking differently: Take 81 mg by mouth at bedtime.) 90 tablet 3   Blood Glucose Monitoring Suppl (ACCU-CHEK AVIVA CONNECT) w/Device KIT Check sugar 1x daily 1 kit 0   carvedilol (COREG) 25 MG tablet Take 12.5 mg by mouth 2 (two) times daily with a meal.      dexamethasone (DECADRON) 4 MG tablet Take 2 tablets (8 mg total) by mouth 2 (two) times daily. Start the day before Taxotere. Then again the day after chemo for 3 days. 30 tablet 1   escitalopram (LEXAPRO) 10 MG tablet Take 10 mg by mouth daily.     gabapentin (NEURONTIN)  600 MG tablet Take 600 mg by mouth daily.     glucose blood (ACCU-CHEK AVIVA PLUS) test strip Check sugar 1x daily 100 each 12   insulin glargine (LANTUS SOLOSTAR) 100 UNIT/ML Solostar Pen Inject 25 Units into the skin 2 (two) times daily. 75 mL PRN   Insulin Pen Needle (PEN NEEDLES) 30G X 8 MM MISC 1 each by Does not apply route daily. E11.9 90 each 0   mycophenolate (MYFORTIC) 180 MG EC tablet Take 360 mg by mouth 2 (two) times daily.     nitrofurantoin, macrocrystal-monohydrate, (MACROBID) 100 MG capsule Take 1 capsule (100 mg total) by mouth 2 (two) times daily. 10 capsule 0   NOVOLOG FLEXPEN 100 UNIT/ML FlexPen Inject 5 Units into the skin 3 (three) times daily.     ondansetron (ZOFRAN) 8 MG tablet Take 1 tablet (8 mg total) by mouth 2 (two) times daily as needed for refractory nausea / vomiting. Start on day 3 after chemo. 30 tablet 1   pantoprazole (PROTONIX) 40 MG tablet Take 40 mg by mouth daily.  5   predniSONE (DELTASONE) 5 MG tablet Take 5 mg by mouth daily.   2   prochlorperazine (COMPAZINE) 10 MG tablet Take 1 tablet (10 mg total) by mouth every 6 (six) hours as needed (Nausea or vomiting). 30 tablet 1   rosuvastatin (CRESTOR) 5 MG tablet Take 5 mg by mouth at bedtime.     tacrolimus (PROGRAF) 1 MG capsule Take 3 mg by mouth daily.     valACYclovir (VALTREX) 1000 MG tablet Take 1 tablet (1,000 mg total) by mouth 2 (two) times daily. 16 tablet 0   No current facility-administered medications for this visit.    PHYSICAL EXAMINATION: ECOG PERFORMANCE STATUS: 1 - Symptomatic but completely ambulatory  There were no vitals filed for this visit. Wt Readings from Last 3 Encounters:  06/21/21 202 lb (91.6 kg)  06/13/21 203 lb (92.1 kg)  06/05/21 207 lb 8 oz (94.1 kg)     GENERAL:alert, no distress and comfortable SKIN: skin color normal, no rashes or significant lesions Chest: Clear to auscultation bilaterally, heart rate and rhythm regular Abdomen: Soft, nontender, nondistended,  no hepatosplenomegaly Breast: Left breast scar well-healed, left axillary scar well-healed EYES: normal, Conjunctiva are pink and non-injected, sclera clear  NEURO: alert & oriented x 3 with fluent speech  LABORATORY DATA:  I have reviewed the data as listed CBC Latest Ref Rng & Units 06/20/2021 06/13/2021 06/02/2021  WBC 4.0 - 10.5 K/uL 6.6 6.2 32.4(H)  Hemoglobin 12.0 - 15.0 g/dL 10.9(L) 10.8(L) 10.1(L)  Hematocrit 36.0 - 46.0 % 34.2(L) 33.7(L) 31.0(L)  Platelets 150 - 400  K/uL 198 258 190     CMP Latest Ref Rng & Units 06/20/2021 06/01/2021 05/31/2021  Glucose 70 - 99 mg/dL 114(H) 224(H) 200(H)  BUN 6 - 20 mg/dL 21(H) 29(H) 32(H)  Creatinine 0.44 - 1.00 mg/dL 1.29(H) 1.26(H) 1.28(H)  Sodium 135 - 145 mmol/L 140 131(L) 129(L)  Potassium 3.5 - 5.1 mmol/L 4.5 4.7 4.7  Chloride 98 - 111 mmol/L 106 99 98  CO2 22 - 32 mmol/L _0 Calcium 8.9 - 10.3 mg/dL 10.4(H) 9.3 9.1  Total Protein 6.5 - 8.1 g/dL 6.2(L) - -  Total Bilirubin 0.3 - 1.2 mg/dL 0.4 - -  Alkaline Phos 38 - 126 U/L 86 - -  AST 15 - 41 U/L 18 - -  ALT 0 - 44 U/L 23 - -      RADIOGRAPHIC STUDIES: I have personally reviewed the radiological images as listed and agreed with the findings in the report. No results found.    No orders of the defined types were placed in this encounter.  All questions were answered. The patient knows to call the clinic with any problems, questions or concerns. No barriers to learning was detected. I spent 30 minutes in the care of this patient including history, review of medical records, counseling and coordination of care.    Benay Pike, MD 06/26/2021

## 2021-06-27 ENCOUNTER — Inpatient Hospital Stay: Payer: Medicare HMO

## 2021-06-27 ENCOUNTER — Other Ambulatory Visit: Payer: Medicare HMO

## 2021-06-27 ENCOUNTER — Ambulatory Visit: Payer: Medicare HMO | Admitting: Hematology

## 2021-06-27 ENCOUNTER — Encounter: Payer: Self-pay | Admitting: Hematology and Oncology

## 2021-06-27 ENCOUNTER — Encounter: Payer: Self-pay | Admitting: *Deleted

## 2021-06-27 ENCOUNTER — Inpatient Hospital Stay: Payer: Medicare HMO | Attending: Hematology | Admitting: Hematology and Oncology

## 2021-06-27 ENCOUNTER — Other Ambulatory Visit: Payer: Self-pay

## 2021-06-27 VITALS — BP 108/58 | HR 89 | Temp 96.4°F | Resp 17 | Wt 197.4 lb

## 2021-06-27 DIAGNOSIS — Z1502 Genetic susceptibility to malignant neoplasm of ovary: Secondary | ICD-10-CM

## 2021-06-27 DIAGNOSIS — Z170 Estrogen receptor positive status [ER+]: Secondary | ICD-10-CM | POA: Insufficient documentation

## 2021-06-27 DIAGNOSIS — E1140 Type 2 diabetes mellitus with diabetic neuropathy, unspecified: Secondary | ICD-10-CM | POA: Diagnosis not present

## 2021-06-27 DIAGNOSIS — R197 Diarrhea, unspecified: Secondary | ICD-10-CM | POA: Diagnosis not present

## 2021-06-27 DIAGNOSIS — C50312 Malignant neoplasm of lower-inner quadrant of left female breast: Secondary | ICD-10-CM

## 2021-06-27 DIAGNOSIS — I10 Essential (primary) hypertension: Secondary | ICD-10-CM | POA: Diagnosis not present

## 2021-06-27 DIAGNOSIS — Z940 Kidney transplant status: Secondary | ICD-10-CM | POA: Diagnosis not present

## 2021-06-27 DIAGNOSIS — Z1501 Genetic susceptibility to malignant neoplasm of breast: Secondary | ICD-10-CM | POA: Diagnosis not present

## 2021-06-27 DIAGNOSIS — Z5189 Encounter for other specified aftercare: Secondary | ICD-10-CM | POA: Diagnosis not present

## 2021-06-27 DIAGNOSIS — R110 Nausea: Secondary | ICD-10-CM | POA: Insufficient documentation

## 2021-06-27 DIAGNOSIS — Z79899 Other long term (current) drug therapy: Secondary | ICD-10-CM | POA: Diagnosis not present

## 2021-06-27 DIAGNOSIS — Z5111 Encounter for antineoplastic chemotherapy: Secondary | ICD-10-CM | POA: Insufficient documentation

## 2021-06-27 DIAGNOSIS — Z1509 Genetic susceptibility to other malignant neoplasm: Secondary | ICD-10-CM

## 2021-06-27 LAB — CBC WITH DIFFERENTIAL (CANCER CENTER ONLY)
Abs Immature Granulocytes: 0.55 10*3/uL — ABNORMAL HIGH (ref 0.00–0.07)
Basophils Absolute: 0.1 10*3/uL (ref 0.0–0.1)
Basophils Relative: 2 %
Eosinophils Absolute: 0.3 10*3/uL (ref 0.0–0.5)
Eosinophils Relative: 6 %
HCT: 32.6 % — ABNORMAL LOW (ref 36.0–46.0)
Hemoglobin: 10.4 g/dL — ABNORMAL LOW (ref 12.0–15.0)
Immature Granulocytes: 11 %
Lymphocytes Relative: 24 %
Lymphs Abs: 1.2 10*3/uL (ref 0.7–4.0)
MCH: 30.3 pg (ref 26.0–34.0)
MCHC: 31.9 g/dL (ref 30.0–36.0)
MCV: 95 fL (ref 80.0–100.0)
Monocytes Absolute: 0.5 10*3/uL (ref 0.1–1.0)
Monocytes Relative: 11 %
Neutro Abs: 2.3 10*3/uL (ref 1.7–7.7)
Neutrophils Relative %: 46 %
Platelet Count: 114 10*3/uL — ABNORMAL LOW (ref 150–400)
RBC: 3.43 MIL/uL — ABNORMAL LOW (ref 3.87–5.11)
RDW: 14.8 % (ref 11.5–15.5)
Smear Review: NORMAL
WBC Count: 5 10*3/uL (ref 4.0–10.5)
nRBC: 0 % (ref 0.0–0.2)

## 2021-06-27 LAB — CMP (CANCER CENTER ONLY)
ALT: 13 U/L (ref 0–44)
AST: 12 U/L — ABNORMAL LOW (ref 15–41)
Albumin: 3.8 g/dL (ref 3.5–5.0)
Alkaline Phosphatase: 94 U/L (ref 38–126)
Anion gap: 5 (ref 5–15)
BUN: 26 mg/dL — ABNORMAL HIGH (ref 6–20)
CO2: 27 mmol/L (ref 22–32)
Calcium: 10.4 mg/dL — ABNORMAL HIGH (ref 8.9–10.3)
Chloride: 105 mmol/L (ref 98–111)
Creatinine: 1.66 mg/dL — ABNORMAL HIGH (ref 0.44–1.00)
GFR, Estimated: 37 mL/min — ABNORMAL LOW (ref >60)
Glucose, Bld: 119 mg/dL — ABNORMAL HIGH (ref 70–99)
Potassium: 4.8 mmol/L (ref 3.5–5.1)
Sodium: 137 mmol/L (ref 135–145)
Total Bilirubin: 0.4 mg/dL (ref 0.3–1.2)
Total Protein: 6 g/dL — ABNORMAL LOW (ref 6.5–8.1)

## 2021-06-29 ENCOUNTER — Encounter (HOSPITAL_BASED_OUTPATIENT_CLINIC_OR_DEPARTMENT_OTHER): Payer: Self-pay

## 2021-07-02 ENCOUNTER — Encounter (HOSPITAL_COMMUNITY): Payer: Self-pay | Admitting: Emergency Medicine

## 2021-07-02 ENCOUNTER — Other Ambulatory Visit: Payer: Self-pay

## 2021-07-02 ENCOUNTER — Inpatient Hospital Stay (HOSPITAL_COMMUNITY)
Admission: EM | Admit: 2021-07-02 | Discharge: 2021-07-05 | DRG: 690 | Disposition: A | Discharge status: Home / Self Care | Payer: Medicare HMO | Attending: Internal Medicine | Admitting: Internal Medicine

## 2021-07-02 DIAGNOSIS — B9620 Unspecified Escherichia coli [E. coli] as the cause of diseases classified elsewhere: Secondary | ICD-10-CM | POA: Diagnosis present

## 2021-07-02 DIAGNOSIS — Z91048 Other nonmedicinal substance allergy status: Secondary | ICD-10-CM

## 2021-07-02 DIAGNOSIS — Z833 Family history of diabetes mellitus: Secondary | ICD-10-CM

## 2021-07-02 DIAGNOSIS — D649 Anemia, unspecified: Secondary | ICD-10-CM

## 2021-07-02 DIAGNOSIS — H918X1 Other specified hearing loss, right ear: Secondary | ICD-10-CM | POA: Diagnosis present

## 2021-07-02 DIAGNOSIS — E669 Obesity, unspecified: Secondary | ICD-10-CM | POA: Diagnosis present

## 2021-07-02 DIAGNOSIS — Z7952 Long term (current) use of systemic steroids: Secondary | ICD-10-CM

## 2021-07-02 DIAGNOSIS — I129 Hypertensive chronic kidney disease with stage 1 through stage 4 chronic kidney disease, or unspecified chronic kidney disease: Secondary | ICD-10-CM | POA: Diagnosis present

## 2021-07-02 DIAGNOSIS — Z20822 Contact with and (suspected) exposure to covid-19: Secondary | ICD-10-CM | POA: Diagnosis present

## 2021-07-02 DIAGNOSIS — E785 Hyperlipidemia, unspecified: Secondary | ICD-10-CM | POA: Diagnosis present

## 2021-07-02 DIAGNOSIS — D6481 Anemia due to antineoplastic chemotherapy: Secondary | ICD-10-CM | POA: Diagnosis present

## 2021-07-02 DIAGNOSIS — Z888 Allergy status to other drugs, medicaments and biological substances status: Secondary | ICD-10-CM

## 2021-07-02 DIAGNOSIS — E871 Hypo-osmolality and hyponatremia: Secondary | ICD-10-CM

## 2021-07-02 DIAGNOSIS — Z9049 Acquired absence of other specified parts of digestive tract: Secondary | ICD-10-CM

## 2021-07-02 DIAGNOSIS — T451X5A Adverse effect of antineoplastic and immunosuppressive drugs, initial encounter: Secondary | ICD-10-CM | POA: Diagnosis present

## 2021-07-02 DIAGNOSIS — Z794 Long term (current) use of insulin: Secondary | ICD-10-CM

## 2021-07-02 DIAGNOSIS — E1122 Type 2 diabetes mellitus with diabetic chronic kidney disease: Secondary | ICD-10-CM | POA: Diagnosis present

## 2021-07-02 DIAGNOSIS — Z8249 Family history of ischemic heart disease and other diseases of the circulatory system: Secondary | ICD-10-CM

## 2021-07-02 DIAGNOSIS — E1140 Type 2 diabetes mellitus with diabetic neuropathy, unspecified: Secondary | ICD-10-CM | POA: Diagnosis present

## 2021-07-02 DIAGNOSIS — C50911 Malignant neoplasm of unspecified site of right female breast: Secondary | ICD-10-CM | POA: Diagnosis present

## 2021-07-02 DIAGNOSIS — N390 Urinary tract infection, site not specified: Secondary | ICD-10-CM | POA: Diagnosis not present

## 2021-07-02 DIAGNOSIS — N3000 Acute cystitis without hematuria: Secondary | ICD-10-CM

## 2021-07-02 DIAGNOSIS — N1831 Chronic kidney disease, stage 3a: Secondary | ICD-10-CM | POA: Diagnosis present

## 2021-07-02 DIAGNOSIS — Z7985 Long-term (current) use of injectable non-insulin antidiabetic drugs: Secondary | ICD-10-CM

## 2021-07-02 DIAGNOSIS — Z1611 Resistance to penicillins: Secondary | ICD-10-CM | POA: Diagnosis present

## 2021-07-02 DIAGNOSIS — Z841 Family history of disorders of kidney and ureter: Secondary | ICD-10-CM

## 2021-07-02 DIAGNOSIS — Z1624 Resistance to multiple antibiotics: Secondary | ICD-10-CM | POA: Diagnosis present

## 2021-07-02 DIAGNOSIS — E1165 Type 2 diabetes mellitus with hyperglycemia: Secondary | ICD-10-CM | POA: Diagnosis present

## 2021-07-02 DIAGNOSIS — Z79899 Other long term (current) drug therapy: Secondary | ICD-10-CM

## 2021-07-02 DIAGNOSIS — Z940 Kidney transplant status: Secondary | ICD-10-CM

## 2021-07-02 DIAGNOSIS — R21 Rash and other nonspecific skin eruption: Secondary | ICD-10-CM | POA: Diagnosis present

## 2021-07-02 DIAGNOSIS — D849 Immunodeficiency, unspecified: Secondary | ICD-10-CM | POA: Diagnosis present

## 2021-07-02 DIAGNOSIS — F32A Depression, unspecified: Secondary | ICD-10-CM | POA: Diagnosis present

## 2021-07-02 DIAGNOSIS — Z87440 Personal history of urinary (tract) infections: Secondary | ICD-10-CM

## 2021-07-02 DIAGNOSIS — Z6838 Body mass index (BMI) 38.0-38.9, adult: Secondary | ICD-10-CM

## 2021-07-02 DIAGNOSIS — Z90710 Acquired absence of both cervix and uterus: Secondary | ICD-10-CM

## 2021-07-02 DIAGNOSIS — D6959 Other secondary thrombocytopenia: Secondary | ICD-10-CM | POA: Diagnosis present

## 2021-07-02 DIAGNOSIS — K219 Gastro-esophageal reflux disease without esophagitis: Secondary | ICD-10-CM | POA: Diagnosis present

## 2021-07-02 DIAGNOSIS — I10 Essential (primary) hypertension: Secondary | ICD-10-CM | POA: Diagnosis present

## 2021-07-02 DIAGNOSIS — G8929 Other chronic pain: Secondary | ICD-10-CM | POA: Diagnosis present

## 2021-07-02 DIAGNOSIS — Z7982 Long term (current) use of aspirin: Secondary | ICD-10-CM

## 2021-07-02 LAB — CBC WITH DIFFERENTIAL/PLATELET
Abs Immature Granulocytes: 2.19 10*3/uL — ABNORMAL HIGH (ref 0.00–0.07)
Basophils Absolute: 0.2 10*3/uL — ABNORMAL HIGH (ref 0.0–0.1)
Basophils Relative: 1 %
Eosinophils Absolute: 0.1 10*3/uL (ref 0.0–0.5)
Eosinophils Relative: 0 %
HCT: 29.1 % — ABNORMAL LOW (ref 36.0–46.0)
Hemoglobin: 9.9 g/dL — ABNORMAL LOW (ref 12.0–15.0)
Immature Granulocytes: 10 %
Lymphocytes Relative: 8 %
Lymphs Abs: 1.9 10*3/uL (ref 0.7–4.0)
MCH: 31.2 pg (ref 26.0–34.0)
MCHC: 34 g/dL (ref 30.0–36.0)
MCV: 91.8 fL (ref 80.0–100.0)
Monocytes Absolute: 2.1 10*3/uL — ABNORMAL HIGH (ref 0.1–1.0)
Monocytes Relative: 9 %
Neutro Abs: 16.5 10*3/uL — ABNORMAL HIGH (ref 1.7–7.7)
Neutrophils Relative %: 72 %
Platelets: 145 10*3/uL — ABNORMAL LOW (ref 150–400)
RBC: 3.17 MIL/uL — ABNORMAL LOW (ref 3.87–5.11)
RDW: 15 % (ref 11.5–15.5)
WBC: 22.9 10*3/uL — ABNORMAL HIGH (ref 4.0–10.5)
nRBC: 0.9 % — ABNORMAL HIGH (ref 0.0–0.2)

## 2021-07-02 LAB — COMPREHENSIVE METABOLIC PANEL
ALT: 16 U/L (ref 0–44)
AST: 15 U/L (ref 15–41)
Albumin: 3.7 g/dL (ref 3.5–5.0)
Alkaline Phosphatase: 144 U/L — ABNORMAL HIGH (ref 38–126)
Anion gap: 5 (ref 5–15)
BUN: 13 mg/dL (ref 6–20)
CO2: 22 mmol/L (ref 22–32)
Calcium: 9.2 mg/dL (ref 8.9–10.3)
Chloride: 97 mmol/L — ABNORMAL LOW (ref 98–111)
Creatinine, Ser: 1.17 mg/dL — ABNORMAL HIGH (ref 0.44–1.00)
GFR, Estimated: 56 mL/min — ABNORMAL LOW (ref >60)
Glucose, Bld: 237 mg/dL — ABNORMAL HIGH (ref 70–99)
Potassium: 4.7 mmol/L (ref 3.5–5.1)
Sodium: 124 mmol/L — ABNORMAL LOW (ref 135–145)
Total Bilirubin: 0.5 mg/dL (ref 0.3–1.2)
Total Protein: 6 g/dL — ABNORMAL LOW (ref 6.5–8.1)

## 2021-07-02 LAB — URINALYSIS, ROUTINE W REFLEX MICROSCOPIC
Bilirubin Urine: NEGATIVE
Glucose, UA: NEGATIVE mg/dL
Hgb urine dipstick: NEGATIVE
Ketones, ur: NEGATIVE mg/dL
Nitrite: NEGATIVE
Protein, ur: NEGATIVE mg/dL
Specific Gravity, Urine: 1.012 (ref 1.005–1.030)
WBC, UA: >50 WBC/hpf — ABNORMAL HIGH (ref 0–5)
pH: 5 (ref 5.0–8.0)

## 2021-07-02 LAB — RESP PANEL BY RT-PCR (FLU A&B, COVID) ARPGX2
Influenza A by PCR: NEGATIVE
Influenza B by PCR: NEGATIVE
SARS Coronavirus 2 by RT PCR: NEGATIVE

## 2021-07-02 LAB — MAGNESIUM: Magnesium: 1.7 mg/dL (ref 1.7–2.4)

## 2021-07-02 MED ORDER — SODIUM CHLORIDE 0.9 % IV SOLN
1.0000 g | INTRAVENOUS | Status: AC
  Administered 2021-07-03: 1 g via INTRAVENOUS
  Filled 2021-07-02 (×2): qty 1

## 2021-07-02 MED ORDER — LACTATED RINGERS IV BOLUS: 1000.0000 mL | Freq: Once | INTRAVENOUS | Status: DC

## 2021-07-02 MED ORDER — LACTATED RINGERS IV BOLUS
500.0000 mL | Freq: Once | INTRAVENOUS | Status: AC
  Administered 2021-07-02: 500 mL via INTRAVENOUS

## 2021-07-02 NOTE — ED Provider Notes (Signed)
[facility] DEPT Provider Note   CSN: [id number] Arrival date & time: 07/02/21  1804     History  Chief Complaint  Patient presents with   Dysuria    Cynthia [name] is a 51 y.o. female.  The history is provided by the patient. The history is limited by a language barrier. A language interpreter was used.  Dysuria Associated symptoms: abdominal pain, nausea and vomiting   Associated symptoms: no fever and no flank pain   Patient presents for dysuria.  Onset was 4 days ago.  She does have history of UTIs.  Medical history is notable for renal transplant, IDDM, breast cancer (currently on chemotherapy).  1 month ago, she had an admission for UTI.  Cultures grew ESBL.  She was treated with meropenem.  She was prescribed a 2-week course of carbapenems.  This included home IV antibiotics (ertapenem).  Her renal transplant was in 2017.   She is on tacrolimus, mycophenolate, and prednisone.  She reports that she completed her IV antibiotics at home.  She was also given p.o. antibiotics to take which she completed on Monday, 6 days ago.  Her last chemotherapy was on 12/28.  4 days ago, she developed symptoms of dysuria, chills, back pain, nausea, and suprapubic pain.  She states that her symptoms are similar to symptoms she had before, prior to her last hospitalization.  She has an additional concern about an area of rash on her right forearm.  This is the area where she recently received chemotherapy.  Although her chemotherapy was 10 days ago, patient reports that the rash developed only over the past 3 days.  It started in the distal location on the dorsal aspect of her right wrist.  He has since spread up her forearm.  It is painful and itchy.    Home Medications Prior to Admission medications   Medication Sig Start Date End Date Taking? Authorizing Provider  Accu-Chek Softclix Lancets lancets Use as instructed Patient taking differently: 1 each by Other route See  admin instructions. Use as instructed 08/03/20  Yes Renato Shin, MD  acetaminophen (TYLENOL) 500 MG tablet Take 500 mg by mouth every 6 (six) hours as needed for mild pain, fever or headache.   Yes [provider]  aspirin EC 81 MG EC tablet Take 1 tablet (81 mg total) by mouth daily. Patient taking differently: Take 81 mg by mouth at bedtime. 03/31/14  Yes Luiz Blare Y, DO  Blood Glucose Monitoring Suppl (ACCU-CHEK AVIVA CONNECT) w/Device KIT Check sugar 1x daily Patient taking differently: 1 each by Other route See admin instructions. Check sugar 1x daily 08/03/20  Yes Renato Shin, MD  carvedilol (COREG) 25 MG tablet Take 12.5 mg by mouth 2 (two) times daily with a meal.  11/23/15  Yes [provider]  dexamethasone (DECADRON) 4 MG tablet Take 2 tablets (8 mg total) by mouth 2 (two) times daily. Start the day before Taxotere. Then again the day after chemo for 3 days. 05/02/21  Yes Truitt Merle, MD  escitalopram (LEXAPRO) 10 MG tablet Take 10 mg by mouth daily. 03/08/21  Yes [provider]  gabapentin (NEURONTIN) 600 MG tablet Take 600 mg by mouth 2 (two) times daily. 03/07/21  Yes [provider]  glucose blood (ACCU-CHEK AVIVA PLUS) test strip Check sugar 1x daily Patient taking differently: 1 each by Other route See admin instructions. Check sugar 1x daily 08/03/20  Yes Renato Shin, MD  insulin glargine (LANTUS SOLOSTAR) 100 UNIT/ML Solostar  Pen Inject 25 Units into the skin 2 (two) times daily. Patient taking differently: Inject 20 Units into the skin 2 (two) times daily. 06/03/21  Yes Kathie Dike, MD  Insulin Pen Needle (PEN NEEDLES) 30G X 8 MM MISC 1 each by Does not apply route daily. E11.9 09/01/19  Yes Renato Shin, MD  MOUNJARO 2.5 MG/0.5ML Pen Inject 2.5 mg into the skin once a week. 06/28/21  Yes [provider]  mycophenolate (MYFORTIC) 180 MG EC tablet Take 360 mg by mouth 2 (two) times daily.   Yes [provider]  NOVOLOG FLEXPEN  100 UNIT/ML FlexPen Inject 5 Units into the skin 3 (three) times daily. 04/28/21  Yes [provider]  ondansetron (ZOFRAN) 8 MG tablet Take 1 tablet (8 mg total) by mouth 2 (two) times daily as needed for refractory nausea / vomiting. Start on day 3 after chemo. 05/02/21  Yes Truitt Merle, MD  pantoprazole (PROTONIX) 40 MG tablet Take 40 mg by mouth daily. 08/02/15  Yes [provider]  predniSONE (DELTASONE) 5 MG tablet Take 5 mg by mouth daily.  01/04/17  Yes [provider]  rosuvastatin (CRESTOR) 5 MG tablet Take 5 mg by mouth at bedtime.   Yes [provider]  tacrolimus (PROGRAF) 1 MG capsule Take 3 mg by mouth daily. 04/12/21  Yes [provider]  nitrofurantoin, macrocrystal-monohydrate, (MACROBID) 100 MG capsule Take 1 capsule (100 mg total) by mouth 2 (two) times daily. Patient not taking: Reported on 07/02/2021 06/21/21   Truitt Merle, MD  prochlorperazine (COMPAZINE) 10 MG tablet Take 1 tablet (10 mg total) by mouth every 6 (six) hours as needed (Nausea or vomiting). Patient not taking: Reported on 07/02/2021 05/02/21   Truitt Merle, MD  valACYclovir (VALTREX) 1000 MG tablet Take 1 tablet (1,000 mg total) by mouth 2 (two) times daily. Patient not taking: Reported on 07/02/2021 06/03/21   Kathie Dike, MD      Allergies    Other, Docetaxel, Norvasc [amlodipine], and Tape    Review of Systems   Review of Systems  Constitutional:  Positive for appetite change and chills. Negative for fever.  HENT:  Negative for ear pain and sore throat.   Eyes:  Negative for pain and visual disturbance.  Respiratory:  Negative for cough and shortness of breath.   Cardiovascular:  Negative for chest pain and palpitations.  Gastrointestinal:  Positive for abdominal pain, nausea and vomiting. Negative for diarrhea.  Genitourinary:  Positive for dysuria. Negative for flank pain and hematuria.  Musculoskeletal:  Positive for back pain. Negative for arthralgias, myalgias and  neck pain.  Skin:  Negative for color change and rash.  Allergic/Immunologic: Positive for immunocompromised state.  Neurological:  Negative for dizziness, seizures, syncope, weakness, light-headedness and numbness.  All other systems reviewed and are negative.  Physical Exam Updated Vital Signs BP (!) 172/75 (BP Location: Right Arm)    Pulse 74    Temp (!) 97.5 F (36.4 C) (Oral)    Resp 20    Ht '5\' 1"'  (1.549 m)    Wt 92.5 kg    SpO2 100%    BMI 38.53 kg/m  Physical Exam Vitals and nursing note reviewed.  Constitutional:      General: She is not in acute distress.    Appearance: Normal appearance. She is well-developed. She is not ill-appearing, toxic-appearing or diaphoretic.  HENT:     Head: Normocephalic and atraumatic.     Right Ear: External ear normal.  Left Ear: External ear normal.     Nose: Nose normal.     Mouth/Throat:     Mouth: Mucous membranes are moist.     Pharynx: Oropharynx is clear.  Eyes:     General: No scleral icterus.    Extraocular Movements: Extraocular movements intact.     Conjunctiva/sclera: Conjunctivae normal.  Cardiovascular:     Rate and Rhythm: Normal rate and regular rhythm.     Heart sounds: No murmur heard. Pulmonary:     Effort: Pulmonary effort is normal. No respiratory distress.     Breath sounds: Normal breath sounds. No wheezing or rales.  Chest:     Chest wall: No tenderness.  Abdominal:     Palpations: Abdomen is soft.     Tenderness: There is no abdominal tenderness.  Musculoskeletal:        General: No swelling. Normal range of motion.     Cervical back: Normal range of motion and neck supple. No rigidity.     Right lower leg: No edema.     Left lower leg: No edema.  Skin:    General: Skin is warm and dry.     Capillary Refill: Capillary refill takes less than 2 seconds.     Coloration: Skin is not jaundiced or pale.  Neurological:     General: No focal deficit present.     Mental Status: She is alert and oriented to  person, place, and time.     Cranial Nerves: No cranial nerve deficit.     Sensory: No sensory deficit.     Motor: No weakness.     Coordination: Coordination normal.  Psychiatric:        Mood and Affect: Mood normal.        Behavior: Behavior normal.        Thought Content: Thought content normal.        Judgment: Judgment normal.    ED Results / Procedures / Treatments   Labs (all labs ordered are listed, but only abnormal results are displayed) Labs Reviewed  URINE CULTURE - Abnormal; Notable for the following components:      Result Value   Culture   (*)    Value: >=100,000 COLONIES/mL ESCHERICHIA COLI CULTURE REINCUBATED FOR BETTER GROWTH IDENTIFICATION AND SUSCEPTIBILITIES TO FOLLOW Performed at [facility] [address], [place], [place] [address]    All other components within normal limits  URINALYSIS, ROUTINE W REFLEX MICROSCOPIC - Abnormal; Notable for the following components:   APPearance CLOUDY (*)    Leukocytes,Ua LARGE (*)    WBC, UA >50 (*)    Bacteria, UA MANY (*)    Non Squamous Epithelial 0-5 (*)    All other components within normal limits  CBC WITH DIFFERENTIAL/PLATELET - Abnormal; Notable for the following components:   WBC 22.9 (*)    RBC 3.17 (*)    Hemoglobin 9.9 (*)    HCT 29.1 (*)    Platelets 145 (*)    nRBC 0.9 (*)    Neutro Abs 16.5 (*)    Monocytes Absolute 2.1 (*)    Basophils Absolute 0.2 (*)    Abs Immature Granulocytes 2.19 (*)    All other components within normal limits  COMPREHENSIVE METABOLIC PANEL - Abnormal; Notable for the following components:   Sodium 124 (*)    Chloride 97 (*)    Glucose, Bld 237 (*)    Creatinine, Ser 1.17 (*)    Total Protein 6.0 (*)  Alkaline Phosphatase 144 (*)    GFR, Estimated 56 (*)    All other components within normal limits  GLUCOSE, CAPILLARY - Abnormal; Notable for the following components:   Glucose-Capillary 229 (*)    All other components within normal limits  OSMOLALITY -  Abnormal; Notable for the following components:   Osmolality 274 (*)    All other components within normal limits  CBC - Abnormal; Notable for the following components:   WBC 18.5 (*)    RBC 3.04 (*)    Hemoglobin 9.6 (*)    HCT 27.9 (*)    Platelets 128 (*)    nRBC 1.1 (*)    All other components within normal limits  BASIC METABOLIC PANEL - Abnormal; Notable for the following components:   Sodium 128 (*)    Glucose, Bld 184 (*)    Creatinine, Ser 1.07 (*)    All other components within normal limits  GLUCOSE, CAPILLARY - Abnormal; Notable for the following components:   Glucose-Capillary 170 (*)    All other components within normal limits  GLUCOSE, CAPILLARY - Abnormal; Notable for the following components:   Glucose-Capillary 131 (*)    All other components within normal limits  GLUCOSE, CAPILLARY - Abnormal; Notable for the following components:   Glucose-Capillary 237 (*)    All other components within normal limits  GLUCOSE, CAPILLARY - Abnormal; Notable for the following components:   Glucose-Capillary 146 (*)    All other components within normal limits  GLUCOSE, CAPILLARY - Abnormal; Notable for the following components:   Glucose-Capillary 101 (*)    All other components within normal limits  BASIC METABOLIC PANEL - Abnormal; Notable for the following components:   Glucose, Bld 119 (*)    Creatinine, Ser 1.08 (*)    All other components within normal limits  CBC WITH DIFFERENTIAL/PLATELET - Abnormal; Notable for the following components:   WBC 13.1 (*)    RBC 3.23 (*)    Hemoglobin 10.1 (*)    HCT 30.9 (*)    RDW 15.9 (*)    Platelets 131 (*)    nRBC 2.1 (*)    Neutro Abs 9.6 (*)    Basophils Absolute 0.2 (*)    Abs Immature Granulocytes 1.23 (*)    All other components within normal limits  GLUCOSE, CAPILLARY - Abnormal; Notable for the following components:   Glucose-Capillary 173 (*)    All other components within normal limits  GLUCOSE, CAPILLARY -  Abnormal; Notable for the following components:   Glucose-Capillary 232 (*)    All other components within normal limits  GLUCOSE, CAPILLARY - Abnormal; Notable for the following components:   Glucose-Capillary 161 (*)    All other components within normal limits  RESP PANEL BY RT-PCR (FLU A&B, COVID) ARPGX2  MAGNESIUM  LACTIC ACID, PLASMA  LACTIC ACID, PLASMA  MAGNESIUM  MYCOPHENOLIC ACID (CELLCEPT)  TACROLIMUS LEVEL  MAGNESIUM  CBC WITH DIFFERENTIAL/PLATELET  BASIC METABOLIC PANEL    EKG None  Radiology No results found.  Procedures Procedures    Medications Ordered in ED Medications  acetaminophen (TYLENOL) tablet 650 mg (650 mg Oral Given 07/03/21 0027)  feeding supplement (ENSURE ENLIVE / ENSURE PLUS) liquid 237 mL (237 mLs Oral Given 07/04/21 1736)  0.9 %  sodium chloride infusion ( Intravenous Infusion Verify 07/03/21 0616)  aspirin EC tablet 81 mg (81 mg Oral Given 07/04/21 2108)  carvedilol (COREG) tablet 12.5 mg (12.5 mg Oral Given 07/04/21 1734)  rosuvastatin (CRESTOR) tablet 5 mg (  5 mg Oral Given 07/04/21 2108)  escitalopram (LEXAPRO) tablet 10 mg (10 mg Oral Given 07/04/21 1110)  pantoprazole (PROTONIX) EC tablet 40 mg (40 mg Oral Given 07/04/21 1110)  mycophenolate (MYFORTIC) EC tablet 360 mg (360 mg Oral Given 07/04/21 2107)  predniSONE (DELTASONE) tablet 5 mg (5 mg Oral Given 07/04/21 0805)  tacrolimus (PROGRAF) capsule 3 mg (3 mg Oral Given 07/04/21 1110)  gabapentin (NEURONTIN) capsule 600 mg (600 mg Oral Given 07/04/21 2107)  enoxaparin (LOVENOX) injection 45 mg (45 mg Subcutaneous Given 07/04/21 1111)  insulin aspart (novoLOG) injection 0-9 Units (3 Units Subcutaneous Given 07/04/21 1731)  insulin aspart (novoLOG) injection 0-5 Units (0 Units Subcutaneous Not Given 07/04/21 2106)  insulin glargine-yfgn (SEMGLEE) injection 20 Units (20 Units Subcutaneous Given 07/04/21 2107)  insulin aspart (novoLOG) injection 5 Units (5 Units Subcutaneous Given 07/04/21 1731)   meropenem (MERREM) 1 g in sodium chloride 0.9 % 100 mL IVPB (1 g Intravenous New Bag/Given 07/05/21 0056)  hydrALAZINE (APRESOLINE) injection 5 mg (has no administration in time range)  diphenhydrAMINE-zinc acetate (BENADRYL) 2-0.1 % cream ( Topical Given 07/03/21 1305)  ibuprofen (ADVIL) tablet 400 mg (400 mg Oral Given 07/04/21 1735)  lactated ringers bolus 500 mL (500 mLs Intravenous New Bag/Given 07/02/21 2111)  meropenem (MERREM) 1 g in sodium chloride 0.9 % 100 mL IVPB (1 g Intravenous New Bag/Given 07/03/21 0032)  magnesium sulfate IVPB 2 g 50 mL (2 g Intravenous New Bag/Given 07/04/21 1238)    ED Course/ Medical Decision Making/ A&P                           Medical Decision Making  This patient presents to the ED for concern of dysuria, chills, nausea, back pain, this involves an extensive number of treatment options, and is a complaint that carries with it a high risk of complications and morbidity.  The differential diagnosis includes pyelonephritis, sepsis, obstructive uropathy, side effect from chemotherapy.   Co morbidities that complicate the patient evaluation  renal transplant, IDDM, breast cancer (currently on chemotherapy), and history of UTI   Additional history obtained:  Additional history obtained from patient's daughter and sister External records from outside source obtained and reviewed including EMR   Lab Tests:  I Ordered, and personally interpreted labs.  The pertinent results include:  urinalysis consistent with acute UTI; creatinine appears improved from baseline; patient is hyponatremic (124); leukocytosis with neutrophilia is present, lactic acid is normal   Imaging Studies ordered:  I ordered imaging studies including none I independently visualized and interpreted imaging which showed N/A   Cardiac Monitoring:  The patient was maintained on a cardiac monitor.  I personally viewed and interpreted the cardiac monitored which showed an underlying  rhythm of: Sinus rhythm   Medicines ordered and prescription drug management:  I ordered medication including IV fluids and antibiotics for treatment of UTI Reevaluation of the patient after these medicines showed that the patient stayed the same I have reviewed the patients home medicines and have made adjustments as needed  Critical Interventions:  Initiation of antibiotics for treatment of UTI.  Based on previous cultures and susceptibilities, meropenem was initiated.  Problem List / ED Course:  Very pleasant 51 year old female, currently undergoing chemotherapy for metastatic breast cancer, presenting for symptoms of UTI.  In addition to her chemotherapy, patient does take immunosuppressive medications for previous renal transplant.  This puts her at risk for worsening severity of infection.  She had a recent  hospitalization 1 month ago for UTI.  At that time, she was treated with several weeks of carbapenems.  Symptoms today are reminiscent of symptoms prior to that recent hospitalization.  Additionally, she has concern of a rash on her distal right forearm.  This rash is just proximal to the location of her last chemotherapy.  It is well demarcated.  Patient underwent a diagnostic work-up which did show acute UTI.  Meropenem was initiated.  Although the rash in her arm does not appear cellulitic, patient will benefit from empiric antibiotic coverage due to the worsening spread of this area of erythema and tenderness.  Given her comorbidities, patient will require hospital admission for continued management and observation.  Patient remained stable and asymptomatic while at rest in the ED.  She was admitted to hospitalist.   Reevaluation:  After the interventions noted above, I reevaluated the patient and found that they have :stayed the same   Social Determinants of Health:  Patient appears to have a very strong family support system.  She has good follow-up care with  hematology-oncology.  She is primarily Spanish-speaking which may hinder patient-provider communication.   Dispostion:  After consideration of the diagnostic results and the patients response to treatment, I feel that the patent would benefit from admission to hospital.        Final Clinical Impression(s) / ED Diagnoses Final diagnoses:  Acute cystitis without hematuria  Immunosuppressed status [facility])    Rx / DC Orders ED Discharge Orders     None         Godfrey Pick, MD 07/05/21 0246

## 2021-07-02 NOTE — Progress Notes (Signed)
A consult was received from an ED physician for UTI per pharmacy dosing.  The patient's profile has been reviewed for ht/wt/allergies/indication/available labs.    A one time order has been placed for Meropenem 1gm IV .    Further antibiotics/pharmacy consults should be ordered by admitting physician if indicated.                       Thank you, Everette Rank, PharmD 07/02/2021  10:48 PM

## 2021-07-02 NOTE — ED Triage Notes (Signed)
Patient BIB daughter, dysuria x4 days. Hx UTI. States rash to R arm x3 days above IV site for infusion on 12/28. Hx breast cancer.

## 2021-07-03 ENCOUNTER — Encounter (HOSPITAL_COMMUNITY): Payer: Self-pay | Admitting: Internal Medicine

## 2021-07-03 DIAGNOSIS — K219 Gastro-esophageal reflux disease without esophagitis: Secondary | ICD-10-CM | POA: Diagnosis present

## 2021-07-03 DIAGNOSIS — I10 Essential (primary) hypertension: Secondary | ICD-10-CM

## 2021-07-03 DIAGNOSIS — F32A Depression, unspecified: Secondary | ICD-10-CM | POA: Diagnosis present

## 2021-07-03 DIAGNOSIS — D6481 Anemia due to antineoplastic chemotherapy: Secondary | ICD-10-CM | POA: Diagnosis present

## 2021-07-03 DIAGNOSIS — Z7982 Long term (current) use of aspirin: Secondary | ICD-10-CM | POA: Diagnosis not present

## 2021-07-03 DIAGNOSIS — N1831 Chronic kidney disease, stage 3a: Secondary | ICD-10-CM | POA: Diagnosis present

## 2021-07-03 DIAGNOSIS — I129 Hypertensive chronic kidney disease with stage 1 through stage 4 chronic kidney disease, or unspecified chronic kidney disease: Secondary | ICD-10-CM | POA: Diagnosis present

## 2021-07-03 DIAGNOSIS — Z940 Kidney transplant status: Secondary | ICD-10-CM

## 2021-07-03 DIAGNOSIS — E871 Hypo-osmolality and hyponatremia: Secondary | ICD-10-CM

## 2021-07-03 DIAGNOSIS — D649 Anemia, unspecified: Secondary | ICD-10-CM

## 2021-07-03 DIAGNOSIS — E1165 Type 2 diabetes mellitus with hyperglycemia: Secondary | ICD-10-CM | POA: Diagnosis present

## 2021-07-03 DIAGNOSIS — Z20822 Contact with and (suspected) exposure to covid-19: Secondary | ICD-10-CM | POA: Diagnosis present

## 2021-07-03 DIAGNOSIS — N3000 Acute cystitis without hematuria: Secondary | ICD-10-CM | POA: Diagnosis not present

## 2021-07-03 DIAGNOSIS — C50911 Malignant neoplasm of unspecified site of right female breast: Secondary | ICD-10-CM | POA: Diagnosis present

## 2021-07-03 DIAGNOSIS — D6959 Other secondary thrombocytopenia: Secondary | ICD-10-CM | POA: Diagnosis present

## 2021-07-03 DIAGNOSIS — Z1611 Resistance to penicillins: Secondary | ICD-10-CM | POA: Diagnosis present

## 2021-07-03 DIAGNOSIS — D849 Immunodeficiency, unspecified: Secondary | ICD-10-CM | POA: Diagnosis present

## 2021-07-03 DIAGNOSIS — Z1624 Resistance to multiple antibiotics: Secondary | ICD-10-CM | POA: Diagnosis present

## 2021-07-03 DIAGNOSIS — R21 Rash and other nonspecific skin eruption: Secondary | ICD-10-CM | POA: Diagnosis present

## 2021-07-03 DIAGNOSIS — G8929 Other chronic pain: Secondary | ICD-10-CM | POA: Diagnosis present

## 2021-07-03 DIAGNOSIS — N390 Urinary tract infection, site not specified: Secondary | ICD-10-CM | POA: Diagnosis present

## 2021-07-03 DIAGNOSIS — E1122 Type 2 diabetes mellitus with diabetic chronic kidney disease: Secondary | ICD-10-CM | POA: Diagnosis present

## 2021-07-03 DIAGNOSIS — E669 Obesity, unspecified: Secondary | ICD-10-CM | POA: Diagnosis present

## 2021-07-03 DIAGNOSIS — H918X1 Other specified hearing loss, right ear: Secondary | ICD-10-CM | POA: Diagnosis present

## 2021-07-03 DIAGNOSIS — E785 Hyperlipidemia, unspecified: Secondary | ICD-10-CM | POA: Diagnosis present

## 2021-07-03 DIAGNOSIS — E1140 Type 2 diabetes mellitus with diabetic neuropathy, unspecified: Secondary | ICD-10-CM | POA: Diagnosis present

## 2021-07-03 DIAGNOSIS — B9620 Unspecified Escherichia coli [E. coli] as the cause of diseases classified elsewhere: Secondary | ICD-10-CM | POA: Diagnosis present

## 2021-07-03 LAB — CBC
HCT: 27.9 % — ABNORMAL LOW (ref 36.0–46.0)
Hemoglobin: 9.6 g/dL — ABNORMAL LOW (ref 12.0–15.0)
MCH: 31.6 pg (ref 26.0–34.0)
MCHC: 34.4 g/dL (ref 30.0–36.0)
MCV: 91.8 fL (ref 80.0–100.0)
Platelets: 128 10*3/uL — ABNORMAL LOW (ref 150–400)
RBC: 3.04 MIL/uL — ABNORMAL LOW (ref 3.87–5.11)
RDW: 15.3 % (ref 11.5–15.5)
WBC: 18.5 10*3/uL — ABNORMAL HIGH (ref 4.0–10.5)
nRBC: 1.1 % — ABNORMAL HIGH (ref 0.0–0.2)

## 2021-07-03 LAB — BASIC METABOLIC PANEL
Anion gap: 6 (ref 5–15)
BUN: 11 mg/dL (ref 6–20)
CO2: 23 mmol/L (ref 22–32)
Calcium: 9.2 mg/dL (ref 8.9–10.3)
Chloride: 99 mmol/L (ref 98–111)
Creatinine, Ser: 1.07 mg/dL — ABNORMAL HIGH (ref 0.44–1.00)
GFR, Estimated: >60 mL/min (ref >60)
Glucose, Bld: 184 mg/dL — ABNORMAL HIGH (ref 70–99)
Potassium: 4.9 mmol/L (ref 3.5–5.1)
Sodium: 128 mmol/L — ABNORMAL LOW (ref 135–145)

## 2021-07-03 LAB — GLUCOSE, CAPILLARY
Glucose-Capillary: 131 mg/dL — ABNORMAL HIGH (ref 70–99)
Glucose-Capillary: 146 mg/dL — ABNORMAL HIGH (ref 70–99)
Glucose-Capillary: 170 mg/dL — ABNORMAL HIGH (ref 70–99)
Glucose-Capillary: 229 mg/dL — ABNORMAL HIGH (ref 70–99)
Glucose-Capillary: 237 mg/dL — ABNORMAL HIGH (ref 70–99)

## 2021-07-03 LAB — OSMOLALITY: Osmolality: 274 mOsm/kg — ABNORMAL LOW (ref 275–295)

## 2021-07-03 LAB — LACTIC ACID, PLASMA
Lactic Acid, Venous: 0.9 mmol/L (ref 0.5–1.9)
Lactic Acid, Venous: 0.9 mmol/L (ref 0.5–1.9)

## 2021-07-03 MED ORDER — ROSUVASTATIN CALCIUM 5 MG PO TABS
5.0000 mg | ORAL_TABLET | Freq: Every day | ORAL | Status: DC
  Administered 2021-07-03 – 2021-07-04 (×2): 5 mg via ORAL
  Filled 2021-07-03 (×2): qty 1

## 2021-07-03 MED ORDER — INSULIN GLARGINE 100 UNIT/ML SOLOSTAR PEN: 20.0000 [IU] | PEN_INJECTOR | Freq: Two times a day (BID) | SUBCUTANEOUS | Status: DC

## 2021-07-03 MED ORDER — DIPHENHYDRAMINE-ZINC ACETATE 2-0.1 % EX CREA
TOPICAL_CREAM | Freq: Three times a day (TID) | CUTANEOUS | Status: DC | PRN
  Filled 2021-07-03: qty 28

## 2021-07-03 MED ORDER — CARVEDILOL 12.5 MG PO TABS
12.5000 mg | ORAL_TABLET | Freq: Two times a day (BID) | ORAL | Status: DC
  Administered 2021-07-03 – 2021-07-05 (×5): 12.5 mg via ORAL
  Filled 2021-07-03 (×5): qty 1

## 2021-07-03 MED ORDER — HYDRALAZINE HCL 20 MG/ML IJ SOLN: 5.0000 mg | Freq: Four times a day (QID) | INTRAMUSCULAR | Status: DC | PRN

## 2021-07-03 MED ORDER — INSULIN ASPART 100 UNIT/ML IJ SOLN
5.0000 [IU] | Freq: Three times a day (TID) | INTRAMUSCULAR | Status: DC
  Administered 2021-07-03 – 2021-07-04 (×6): 5 [IU] via SUBCUTANEOUS

## 2021-07-03 MED ORDER — SODIUM CHLORIDE 0.9 % IV SOLN
1.0000 g | Freq: Three times a day (TID) | INTRAVENOUS | Status: DC
  Administered 2021-07-03 – 2021-07-05 (×7): 1 g via INTRAVENOUS
  Filled 2021-07-03 (×10): qty 1

## 2021-07-03 MED ORDER — ESCITALOPRAM OXALATE 10 MG PO TABS
10.0000 mg | ORAL_TABLET | Freq: Every day | ORAL | Status: DC
  Administered 2021-07-03 – 2021-07-05 (×3): 10 mg via ORAL
  Filled 2021-07-03 (×3): qty 1

## 2021-07-03 MED ORDER — IBUPROFEN 200 MG PO TABS
400.0000 mg | ORAL_TABLET | Freq: Three times a day (TID) | ORAL | Status: DC
  Administered 2021-07-03 – 2021-07-05 (×6): 400 mg via ORAL
  Filled 2021-07-03 (×6): qty 2

## 2021-07-03 MED ORDER — ENSURE ENLIVE PO LIQD
237.0000 mL | Freq: Two times a day (BID) | ORAL | Status: DC
  Administered 2021-07-03 – 2021-07-05 (×5): 237 mL via ORAL

## 2021-07-03 MED ORDER — ACETAMINOPHEN 325 MG PO TABS
650.0000 mg | ORAL_TABLET | Freq: Four times a day (QID) | ORAL | Status: DC | PRN
  Administered 2021-07-03: 650 mg via ORAL
  Filled 2021-07-03: qty 2

## 2021-07-03 MED ORDER — MYCOPHENOLATE SODIUM 180 MG PO TBEC
360.0000 mg | DELAYED_RELEASE_TABLET | Freq: Two times a day (BID) | ORAL | Status: DC
  Administered 2021-07-03 – 2021-07-05 (×5): 360 mg via ORAL
  Filled 2021-07-03 (×6): qty 2

## 2021-07-03 MED ORDER — ASPIRIN EC 81 MG PO TBEC
81.0000 mg | DELAYED_RELEASE_TABLET | Freq: Every day | ORAL | Status: DC
  Administered 2021-07-03 – 2021-07-04 (×2): 81 mg via ORAL
  Filled 2021-07-03 (×2): qty 1

## 2021-07-03 MED ORDER — INSULIN ASPART 100 UNIT/ML FLEXPEN: 5.0000 [IU] | PEN_INJECTOR | Freq: Three times a day (TID) | SUBCUTANEOUS | Status: DC

## 2021-07-03 MED ORDER — INSULIN ASPART 100 UNIT/ML IJ SOLN: 0.0000 [IU] | Freq: Every day | INTRAMUSCULAR | Status: DC

## 2021-07-03 MED ORDER — INSULIN ASPART 100 UNIT/ML IJ SOLN
0.0000 [IU] | Freq: Three times a day (TID) | INTRAMUSCULAR | Status: DC
  Administered 2021-07-03: 1 [IU] via SUBCUTANEOUS
  Administered 2021-07-03: 2 [IU] via SUBCUTANEOUS
  Administered 2021-07-03: 3 [IU] via SUBCUTANEOUS
  Administered 2021-07-04: 2 [IU] via SUBCUTANEOUS
  Administered 2021-07-04: 3 [IU] via SUBCUTANEOUS

## 2021-07-03 MED ORDER — GABAPENTIN 300 MG PO CAPS
600.0000 mg | ORAL_CAPSULE | Freq: Two times a day (BID) | ORAL | Status: DC
  Administered 2021-07-03 – 2021-07-05 (×5): 600 mg via ORAL
  Filled 2021-07-03 (×5): qty 2

## 2021-07-03 MED ORDER — TACROLIMUS 1 MG PO CAPS
3.0000 mg | ORAL_CAPSULE | Freq: Every day | ORAL | Status: DC
  Administered 2021-07-03 – 2021-07-05 (×3): 3 mg via ORAL
  Filled 2021-07-03 (×3): qty 3

## 2021-07-03 MED ORDER — PREDNISONE 5 MG PO TABS
5.0000 mg | ORAL_TABLET | Freq: Every day | ORAL | Status: DC
  Administered 2021-07-03 – 2021-07-05 (×3): 5 mg via ORAL
  Filled 2021-07-03 (×3): qty 1

## 2021-07-03 MED ORDER — PANTOPRAZOLE SODIUM 40 MG PO TBEC
40.0000 mg | DELAYED_RELEASE_TABLET | Freq: Every day | ORAL | Status: DC
  Administered 2021-07-03 – 2021-07-05 (×3): 40 mg via ORAL
  Filled 2021-07-03 (×3): qty 1

## 2021-07-03 MED ORDER — ENOXAPARIN SODIUM 60 MG/0.6ML IJ SOSY
45.0000 mg | PREFILLED_SYRINGE | INTRAMUSCULAR | Status: DC
  Administered 2021-07-03 – 2021-07-05 (×3): 45 mg via SUBCUTANEOUS
  Filled 2021-07-03 (×3): qty 0.6

## 2021-07-03 MED ORDER — INSULIN GLARGINE-YFGN 100 UNIT/ML ~~LOC~~ SOLN
20.0000 [IU] | Freq: Two times a day (BID) | SUBCUTANEOUS | Status: DC
  Administered 2021-07-03 – 2021-07-04 (×4): 20 [IU] via SUBCUTANEOUS
  Administered 2021-07-05: 15 [IU] via SUBCUTANEOUS
  Filled 2021-07-03 (×5): qty 0.2

## 2021-07-03 MED ORDER — SODIUM CHLORIDE 0.9 % IV SOLN: INTRAVENOUS | Status: AC

## 2021-07-03 NOTE — Progress Notes (Signed)
Pharmacy Antibiotic Note  Cynthia [name] is a 51 y.o. female admitted on 07/02/2021 with UTI.  PMH significant for ESBL bacteremia secondary to acute cystitis in December 202, s/p renal transplant and on immunosuppressants, undergoing treatment for breast cancer.  Pharmacy has been consulted for Meropenem dosing.  Plan: Meropenem 1gm IV q8h Follow culture results Follow renal function  Height: 5\' 1"  (154.9 cm) Weight: 92.5 kg (203 lb 14.8 oz) IBW/kg (Calculated) : 47.8  Temp (24hrs), Avg:98.2 F (36.8 C), Min:98 F (36.7 C), Max:98.6 F (37 C)  Recent Labs  Lab 06/27/21 1121 07/02/21 2058 07/03/21 0008 07/03/21 0045  WBC 5.0 22.9*  --   --   CREATININE 1.66* 1.17*  --   --   LATICACIDVEN  --   --  0.9 0.9    Estimated Creatinine Clearance: 58.3 mL/min (A) (by C-G formula based on SCr of 1.17 mg/dL (H)).    Allergies  Allergen Reactions   Other Rash    Burns skin.  Please use paper tape only Burns skin.  Please use paper tape only   Docetaxel Nausea Only    Diaphoretic.  Patient had hypersensitivity reaction to docetaxel.  See progress note from 05/24/2021.  Patient able to complete infusion.   Norvasc [Amlodipine] Swelling   Tape Other (See Comments)    Burns skin.  Please use paper tape only    Antimicrobials this admission: 1/9 Meropenem >>      Dose adjustments this admission:    Microbiology results: 1/8 UCx:      Thank you for allowing pharmacy to be a part of this patients care.  Everette Rank, PharmD 07/03/2021 2:45 AM

## 2021-07-03 NOTE — Progress Notes (Signed)
PROGRESS NOTE    Cynthia [name]  DEY:[id number] DOB: 1971/03/14 DOA: 07/02/2021 PCP: Arthur Holms, NP    Chief Complaint  Patient presents with   Dysuria    Brief Narrative:  Patient is a 51 year old female history of renal transplant on immunosuppressants, CKD stage IIIa, insulin-dependent diabetes mellitus, hypertension, hyperlipidemia, breast cancer status post left lumpectomy currently on chemotherapy hospitalized recently for ESBL E. coli bacteremia secondary to acute cystitis and treated with a 2-week course of carbapenems presenting back to the ED with urinary symptoms, right upper extremity rash.  Noted to be hypertensive on presentation with a leukocytosis of 22.9.  Urinalysis consistent with UTI.  Urine cultures pending.  COVID-19 PCR, influenza PCR negative.  Patient placed empirically on IV Merrem.   Assessment & Plan:   Principal Problem:   UTI (urinary tract infection) Active Problems:   Anemia   History of renal transplant   HTN (hypertension)   Hyponatremia  #1 complicated UTI -Patient presenting with dysuria, suprapubic abdominal pain, urinalysis consistent with UTI. -Patient with history of renal transplant on immunosuppressant therapy as well as breast cancer on chemotherapy. -Patient noted to have recently been hospitalized last month with a ESBL E. coli bacteremia felt secondary to acute cystitis and treated with a 2-week course of carbapenems. -Patient noted on presentation to have urinalysis consistent with a UTI as well as a leukocytosis white count of 22.9. -Urine cultures pending. -Continue IV Merrem. -Supportive care.  2.  Hyponatremia -Likely secondary to hypovolemic hyponatremia. -Hyponatremia improving with hydration. -Follow.  3.  Right upper extremity skin lesion -Patient with an area of erythema on the dorsum of the right hand streaking up to the forearm. -Last chemotherapy was 1228 but erythema started 3 days prior to admission however  patient states that is the site where chemotherapy is given. -Patient with some tenderness to palpation in a chain around erythema in the right streaking arm. -Concern for possible superficial vein thrombophlebitis. -Patient on empiric IV antibiotics. -Place Benadryl cream as needed. -Placed on scheduled NSAIDs for the next 3 to 5 days. -Cold compresses.  4.  CKD stage IIIa/history of renal transplant -Stable. -Continue mycophenolate, prednisone, tacrolimus.  5.  Insulin-dependent diabetes mellitus type 2 Hemoglobin A1c 11.9 on 05/29/2021. -Patient chronically on steroids due to renal transplant. -CBG 170 this morning -Continue Semglee, increase meal coverage NovoLog to 8 units 3 times daily with meals, SSI.  6.  Hypertension -Coreg.  7.  Hyperlipidemia -Statin.  8.  History of breast cancer -Status post left lumpectomy on chemotherapy last chemotherapy 06/21/2021. -Outpatient follow-up with oncology.  9.  Anemia/thrombocytopenia -Secondary to chemotherapy. -No overt bleeding. -Follow.  10.  Depression -Lexapro.  11.  GERD -PPI.  12.  Chronic pain/neuropathy -Gabapentin.   DVT prophylaxis: Lovenox Code Status: Full Family Communication: Updated patient and daughter at bedside Disposition:   Status is: Inpatient  The patient will require care spanning > 2 midnights and should be moved to inpatient because: Severity of illness      Consultants:  None  Procedures:  None  Antimicrobials:  IV Merrem 07/03/2021>>>>>   Subjective: Sleeping but arousable.  No chest pain.  No shortness of breath.  Still with some dysuria.  Suprapubic abdominal pain improving.  Complaining of rash on right upper extremity to be itching with some tenderness to palpation.  Stated per daughter that is why IV placed during chemotherapy.  Objective: Vitals:   07/03/21 0003 07/03/21 0027 07/03/21 0413 07/03/21 1035  BP: (!) 170/67  (!) 143/59 Marland Kitchen)  134/53  Pulse: 78  76 72  Resp:  18  18 16   Temp: 98.6 F (37 C)  98.5 F (36.9 C) 98.1 F (36.7 C)  TempSrc: Oral     SpO2: 97%  92% 96%  Weight:  92.5 kg    Height:  5\' 1"  (1.549 m)      Intake/Output Summary (Last 24 hours) at 07/03/2021 1131 Last data filed at 07/03/2021 1000 Gross per 24 hour  Intake 596.25 ml  Output --  Net 596.25 ml   Filed Weights   07/03/21 0027  Weight: 92.5 kg    Examination:  General exam: Appears calm and comfortable  Respiratory system: Clear to auscultation. Respiratory effort normal. Cardiovascular system: S1 & S2 heard, RRR. No JVD, murmurs, rubs, gallops or clicks. No pedal edema. Gastrointestinal system: Abdomen is nondistended, soft and decreased tenderness to palpation in suprapubic region.  Positive bowel sounds.  No rebound.  No guarding. Central nervous system: Alert and oriented. No focal neurological deficits. Extremities: Symmetric 5 x 5 power. Skin: No rashes, lesions or ulcers Psychiatry: Judgement and insight appear normal. Mood & affect appropriate.       Data Reviewed: I have personally reviewed following labs and imaging studies  CBC: Recent Labs  Lab 06/27/21 1121 07/02/21 2058 07/03/21 0532  WBC 5.0 22.9* 18.5*  NEUTROABS 2.3 16.5*  --   HGB 10.4* 9.9* 9.6*  HCT 32.6* 29.1* 27.9*  MCV 95.0 91.8 91.8  PLT 114* 145* 128*    Basic Metabolic Panel: Recent Labs  Lab 06/27/21 1121 07/02/21 2058 07/03/21 0532  NA 137 124* 128*  K 4.8 4.7 4.9  CL 105 97* 99  CO2 27 22 23   GLUCOSE 119* 237* 184*  BUN 26* 13 11  CREATININE 1.66* 1.17* 1.07*  CALCIUM 10.4* 9.2 9.2  MG  --  1.7  --     GFR: Estimated Creatinine Clearance: 63.8 mL/min (A) (by C-G formula based on SCr of 1.07 mg/dL (H)).  Liver Function Tests: Recent Labs  Lab 06/27/21 1121 07/02/21 2058  AST 12* 15  ALT 13 16  ALKPHOS 94 144*  BILITOT 0.4 0.5  PROT 6.0* 6.0*  ALBUMIN 3.8 3.7    CBG: Recent Labs  Lab 07/03/21 0008 07/03/21 0727  GLUCAP 229* 170*      Recent Results (from the past 240 hour(s))  Resp Panel by RT-PCR (Flu A&B, Covid) Nasopharyngeal Swab     Status: None   Collection Time: 07/02/21  8:59 PM   Specimen: Nasopharyngeal Swab; Nasopharyngeal(NP) swabs in vial transport medium  Result Value Ref Range Status   SARS Coronavirus 2 by RT PCR NEGATIVE NEGATIVE Final    Comment: (NOTE) SARS-CoV-2 target nucleic acids are NOT DETECTED.  The SARS-CoV-2 RNA is generally detectable in upper respiratory specimens during the acute phase of infection. The lowest concentration of SARS-CoV-2 viral copies this assay can detect is 138 copies/mL. A negative result does not preclude SARS-Cov-2 infection and should not be used as the sole basis for treatment or other patient management decisions. A negative result may occur with  improper specimen collection/handling, submission of specimen other than nasopharyngeal swab, presence of viral mutation(s) within the areas targeted by this assay, and inadequate number of viral copies(<138 copies/mL). A negative result must be combined with clinical observations, patient history, and epidemiological information. The expected result is Negative.  Fact Sheet for Patients:  EntrepreneurPulse.com.au  Fact Sheet for Healthcare Providers:  IncredibleEmployment.be  This test is no t yet approved  or cleared by the [place] and  has been authorized for detection and/or diagnosis of SARS-CoV-2 by FDA under an Emergency Use Authorization (EUA). This EUA will remain  in effect (meaning this test can be used) for the duration of the COVID-19 declaration under Section 564(b)(1) of the Act, 21 U.S.C.section 360bbb-3(b)(1), unless the authorization is terminated  or revoked sooner.       Influenza A by PCR NEGATIVE NEGATIVE Final   Influenza B by PCR NEGATIVE NEGATIVE Final    Comment: (NOTE) The Xpert Xpress SARS-CoV-2/FLU/RSV plus assay is intended as  an aid in the diagnosis of influenza from Nasopharyngeal swab specimens and should not be used as a sole basis for treatment. Nasal washings and aspirates are unacceptable for Xpert Xpress SARS-CoV-2/FLU/RSV testing.  Fact Sheet for Patients: EntrepreneurPulse.com.au  Fact Sheet for Healthcare Providers: IncredibleEmployment.be  This test is not yet approved or cleared by the [place] FDA and has been authorized for detection and/or diagnosis of SARS-CoV-2 by FDA under an Emergency Use Authorization (EUA). This EUA will remain in effect (meaning this test can be used) for the duration of the COVID-19 declaration under Section 564(b)(1) of the Act, 21 U.S.C. section 360bbb-3(b)(1), unless the authorization is terminated or revoked.  Performed at [facility], [place] [address], [place], [place] [address]          Radiology Studies: No results found.      Scheduled Meds:  aspirin EC  81 mg Oral QHS   carvedilol  12.5 mg Oral BID WC   enoxaparin (LOVENOX) injection  45 mg Subcutaneous Q24H   escitalopram  10 mg Oral Daily   feeding supplement  237 mL Oral BID BM   gabapentin  600 mg Oral BID   insulin aspart  0-5 Units Subcutaneous QHS   insulin aspart  0-9 Units Subcutaneous TID WC   insulin aspart  5 Units Subcutaneous TID WC   insulin glargine-yfgn  20 Units Subcutaneous BID   mycophenolate  360 mg Oral BID   pantoprazole  40 mg Oral Daily   predniSONE  5 mg Oral Q breakfast   rosuvastatin  5 mg Oral QHS   tacrolimus  3 mg Oral Daily   Continuous Infusions:  sodium chloride 75 mL/hr at 07/03/21 0616   meropenem (MERREM) IV 1 g (07/03/21 1000)     LOS: 0 days    Time spent: 35 minutes  No charge    Irine Seal, MD [facility]   To contact the attending provider between 7A-7P or the covering provider during after hours 7P-7A, please log into the web site [email or web address] and access  using universal [place] password for that web site. If you do not have the password, please call the hospital operator.  07/03/2021, 11:31 AM

## 2021-07-03 NOTE — H&P (Signed)
History and Physical    Cynthia [name] XYI:[id number] DOB: 09-10-1970 DOA: 07/02/2021  PCP: Cynthia Holms, NP Patient coming from: Home  Chief Complaint: Dysuria  HPI: Cynthia [name] is a 51 y.o. female with medical history significant of renal transplant on immunosuppressants, CKD stage IIIa, insulin-dependent diabetes mellitus, hypertension, hyperlipidemia, breast cancer status post left lumpectomy and currently on chemotherapy. Admitted last month for ESBL E. coli bacteremia secondary to acute cystitis and treated with a 2-week course of carbapenems.  She presents to ED for evaluation of urinary symptoms and right upper extremity rash.  On arrival to the ED, patient hypertensive but remainder of vital signs stable.  Labs showing WBC 22.9.  Hemoglobin 9.9, no significant change compared to recent labs.  Platelet count 145k, improved compared to labs done a few days ago.  Sodium 124.  Blood glucose 237.  Creatinine 1.1, stable.  UA with large amount of leukocytes, greater than 50 WBCs, and many bacteria.  Urine culture pending.  COVID and influenza PCR negative.  Lactic acid normal x2. She was given meropenem and 500 cc LR bolus.  Patient speaks Spanish but does understand and speak some [place].  I offered video interpreter but she declined and instead wanted her daughter who was present at bedside to interpret.  Patient reports 3-day history of dysuria, urinary frequency/urgency, suprapubic pain, and bilateral flank pain.  No fevers, chills, nausea, or vomiting.  She is also complaining of a rash on the dorsum of her right hand/forearm which started 3 days ago.  Her last chemotherapy session was on December 28.  Reports mild cough only when her throat feels dry but not otherwise.  Denies shortness of breath or chest pain.  She is vaccinated against COVID and flu.  No other complaints.  Review of Systems:  All systems reviewed and apart from history of presenting illness, are negative.  Past Medical  History:  Diagnosis Date   Allergy    pollen   Anemia    when on dialysis   Anxiety    BRCA2 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 03/01/2021   Breast cancer [facility])    Cataract    surgical repair bilateral   Depression    ESRD on hemodialysis ([place])    Home HD 5x per week- not on dialysis now had tramsplant 4/17   Family history of ovarian cancer 02/22/2021   GERD (gastroesophageal reflux disease)    Hearing loss 2017   right ear   History of blood transfusion    transfusion reaction   Hyperlipidemia    Hypertension    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 per pt-dr levy also II   Kidney transplant recipient    Neuromuscular disorder ([place])    NEUROPATHY   Sleep apnea     Past Surgical History:  Procedure Laterality Date   ABDOMINAL HYSTERECTOMY     BREAST EXCISIONAL BIOPSY Right 08/2015   BREAST LUMPECTOMY WITH RADIOACTIVE SEED LOCALIZATION Right 09/14/2015   Procedure: RIGHT BREAST LUMPECTOMY WITH RADIOACTIVE SEED LOCALIZATION;  Surgeon: Donnie Mesa, MD;  Location: [place];  Service: General;  Laterality: Right;   BREAST LUMPECTOMY WITH RADIOACTIVE SEED LOCALIZATION Left 03/02/2021   Procedure: LEFT BREAST SEED LUMPECTOMY LEFT SENTINEL [place];  Surgeon: Erroll Luna, MD;  Location: [place];  Service: General;  Laterality: Left;  GEN AND PEC BLOCK   BREAST SURGERY Bilateral    biopsy bilateral   CATARACT EXTRACTION  Bilateral    bilateral   CHOLECYSTECTOMY     CYST REMOVAL NECK     DIALYSIS FISTULA CREATION Left    EYE SURGERY Bilateral    lazer   LEFT HEART CATH AND CORONARY ANGIOGRAPHY N/A 03/04/2019   Procedure: LEFT HEART CATH AND CORONARY ANGIOGRAPHY;  Surgeon: Martinique, Peter M, MD;  Location: [place] CV LAB;  Service: Cardiovascular;  Laterality: N/A;   LEFT HEART CATHETERIZATION WITH CORONARY ANGIOGRAM N/A 03/30/2014   Procedure: LEFT HEART CATHETERIZATION WITH  CORONARY ANGIOGRAM;  Surgeon: Sinclair Grooms, MD;  Location: [facility] CATH LAB;  Service: Cardiovascular;  Laterality: N/A;   LIPOMA EXCISION N/A 02/06/2017   Procedure: EXCISION POSTERIOR NECK SEBACEOUS CYST;  Surgeon: Coralie Keens, MD;  Location: [place];  Service: General;  Laterality: N/A;   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   Procedure: REPAIR OF ARTERIOVENOUS FISTULA ANEURYSM;  Surgeon: Serafina Mitchell, MD;  Location: [facility] OR;  Service: Vascular;  Laterality: Left;   REVISON OF ARTERIOVENOUS FISTULA Left 09/28/2013   Procedure: EXCISE ESCHAR LEFT ARM  ARTERIOVENOUS FISTULA WITH PLICATION OF LEFT ARM ARTERIOVENOUS FISTULA;  Surgeon: Elam Dutch, MD;  Location: [place];  Service: Vascular;  Laterality: Left;   REVISON OF ARTERIOVENOUS FISTULA Left 08/26/2015   Procedure: RESECTION ANEURYSM OF LEFT ARM ARTERIOVENOUS FISTULA  ;  Surgeon: Serafina Mitchell, MD;  Location: [place];  Service: Vascular;  Laterality: Left;   TUBAL LIGATION       reports that she has never smoked. She has never used smokeless tobacco. She reports current alcohol use. She reports that she does not use drugs.  Allergies  Allergen Reactions   Other Rash    Burns skin.  Please use paper tape only Burns skin.  Please use paper tape only   Docetaxel Nausea Only    Diaphoretic.  Patient had hypersensitivity reaction to docetaxel.  See progress note from 05/24/2021.  Patient able to complete infusion.   Norvasc [Amlodipine] Swelling   Tape Other (See Comments)    Burns skin.  Please use paper tape only    Family History  Problem Relation Age of Onset   Diabetes Mother    Kidney disease Mother    Diabetes Father    Heart disease Father    Kidney disease Father    Hypertension Father    Diabetes Sister    Asthma Sister    Lupus Sister    Diabetes Brother    Diabetes Brother    Diabetes Brother    Diabetes Brother    Cancer Maternal Grandmother 62       ovarian cancer   Diabetes Maternal  Grandfather    Diabetes Paternal Grandmother    Colon cancer Neg Hx    Colon polyps Neg Hx    Esophageal cancer Neg Hx    Rectal cancer Neg Hx    Stomach cancer Neg Hx     Prior to Admission medications   Medication Sig Start Date End Date Taking? Authorizing Provider  Accu-Chek Softclix Lancets lancets Use as instructed Patient taking differently: 1 each by Other route See admin instructions. Use as instructed 08/03/20  Yes Renato Shin, MD  acetaminophen (TYLENOL) 500 MG tablet Take 500 mg by mouth every 6 (six) hours as needed for mild pain, fever or headache.   Yes [provider]  aspirin EC 81 MG EC tablet Take 1 tablet (81 mg total) by mouth daily. Patient taking differently: Take 81 mg by mouth  at bedtime. 03/31/14  Yes Luiz Blare Y, DO  Blood Glucose Monitoring Suppl (ACCU-CHEK AVIVA CONNECT) w/Device KIT Check sugar 1x daily Patient taking differently: 1 each by Other route See admin instructions. Check sugar 1x daily 08/03/20  Yes Renato Shin, MD  carvedilol (COREG) 25 MG tablet Take 12.5 mg by mouth 2 (two) times daily with a meal.  11/23/15  Yes [provider]  dexamethasone (DECADRON) 4 MG tablet Take 2 tablets (8 mg total) by mouth 2 (two) times daily. Start the day before Taxotere. Then again the day after chemo for 3 days. 05/02/21  Yes Truitt Merle, MD  escitalopram (LEXAPRO) 10 MG tablet Take 10 mg by mouth daily. 03/08/21  Yes [provider]  gabapentin (NEURONTIN) 600 MG tablet Take 600 mg by mouth 2 (two) times daily. 03/07/21  Yes [provider]  glucose blood (ACCU-CHEK AVIVA PLUS) test strip Check sugar 1x daily Patient taking differently: 1 each by Other route See admin instructions. Check sugar 1x daily 08/03/20  Yes Renato Shin, MD  insulin glargine (LANTUS SOLOSTAR) 100 UNIT/ML Solostar Pen Inject 25 Units into the skin 2 (two) times daily. Patient taking differently: Inject 20 Units into the skin 2 (two) times daily. 06/03/21   Yes Kathie Dike, MD  Insulin Pen Needle (PEN NEEDLES) 30G X 8 MM MISC 1 each by Does not apply route daily. E11.9 09/01/19  Yes Renato Shin, MD  MOUNJARO 2.5 MG/0.5ML Pen Inject 2.5 mg into the skin once a week. 06/28/21  Yes [provider]  mycophenolate (MYFORTIC) 180 MG EC tablet Take 360 mg by mouth 2 (two) times daily.   Yes [provider]  NOVOLOG FLEXPEN 100 UNIT/ML FlexPen Inject 5 Units into the skin 3 (three) times daily. 04/28/21  Yes [provider]  ondansetron (ZOFRAN) 8 MG tablet Take 1 tablet (8 mg total) by mouth 2 (two) times daily as needed for refractory nausea / vomiting. Start on day 3 after chemo. 05/02/21  Yes Truitt Merle, MD  pantoprazole (PROTONIX) 40 MG tablet Take 40 mg by mouth daily. 08/02/15  Yes [provider]  predniSONE (DELTASONE) 5 MG tablet Take 5 mg by mouth daily.  01/04/17  Yes [provider]  rosuvastatin (CRESTOR) 5 MG tablet Take 5 mg by mouth at bedtime.   Yes [provider]  tacrolimus (PROGRAF) 1 MG capsule Take 3 mg by mouth daily. 04/12/21  Yes [provider]  nitrofurantoin, macrocrystal-monohydrate, (MACROBID) 100 MG capsule Take 1 capsule (100 mg total) by mouth 2 (two) times daily. Patient not taking: Reported on 07/02/2021 06/21/21   Truitt Merle, MD  prochlorperazine (COMPAZINE) 10 MG tablet Take 1 tablet (10 mg total) by mouth every 6 (six) hours as needed (Nausea or vomiting). Patient not taking: Reported on 07/02/2021 05/02/21   Truitt Merle, MD  valACYclovir (VALTREX) 1000 MG tablet Take 1 tablet (1,000 mg total) by mouth 2 (two) times daily. Patient not taking: Reported on 07/02/2021 06/03/21   Kathie Dike, MD    Physical Exam: Vitals:   07/02/21 2118 07/02/21 2318 07/03/21 0003 07/03/21 0027  BP: (!) 174/66 (!) 161/58 (!) 170/67   Pulse: 73 75 78   Resp: '16 16 18   ' Temp: 98 F (36.7 C) 98.1 F (36.7 C) 98.6 F (37 C)   TempSrc: Oral  Oral   SpO2: 100% 97% 97%   Weight:     92.5 kg  Height:    '5\' 1"'  (1.549 m)    Physical  Exam Constitutional:      General: She is not in acute distress. HENT:     Head: Normocephalic and atraumatic.  Eyes:     Extraocular Movements: Extraocular movements intact.     Conjunctiva/sclera: Conjunctivae normal.  Cardiovascular:     Rate and Rhythm: Normal rate and regular rhythm.     Pulses: Normal pulses.  Pulmonary:     Effort: Pulmonary effort is normal. No respiratory distress.     Breath sounds: Normal breath sounds. No wheezing or rales.  Abdominal:     General: Bowel sounds are normal. There is no distension.     Palpations: Abdomen is soft.     Tenderness: There is no abdominal tenderness.  Musculoskeletal:        General: No swelling or tenderness.     Cervical back: Normal range of motion and neck supple.  Skin:    General: Skin is warm and dry.     Comments: Well demarcated area of erythema on the dorsum of the right hand streaking up to the forearm.  No tenderness on palpation.  Neurological:     General: No focal deficit present.     Mental Status: She is alert and oriented to person, place, and time.       Labs on Admission: I have personally reviewed following labs and imaging studies  CBC: Recent Labs  Lab 06/27/21 1121 07/02/21 2058  WBC 5.0 22.9*  NEUTROABS 2.3 16.5*  HGB 10.4* 9.9*  HCT 32.6* 29.1*  MCV 95.0 91.8  PLT 114* 277*   Basic Metabolic Panel: Recent Labs  Lab 06/27/21 1121 07/02/21 2058  NA 137 124*  K 4.8 4.7  CL 105 97*  CO2 27 22  GLUCOSE 119* 237*  BUN 26* 13  CREATININE 1.66* 1.17*  CALCIUM 10.4* 9.2  MG  --  1.7   GFR: Estimated Creatinine Clearance: 58.3 mL/min (A) (by C-G formula based on SCr of 1.17 mg/dL (H)). Liver Function Tests: Recent Labs  Lab 06/27/21 1121 07/02/21 2058  AST 12* 15  ALT 13 16  ALKPHOS 94 144*  BILITOT 0.4 0.5  PROT 6.0* 6.0*  ALBUMIN 3.8 3.7   No results for input(s): LIPASE, AMYLASE in the last 168 hours. No results for  input(s): AMMONIA in the last 168 hours. Coagulation Profile: No results for input(s): INR, PROTIME in the last 168 hours. Cardiac Enzymes: No results for input(s): CKTOTAL, CKMB, CKMBINDEX, TROPONINI in the last 168 hours. BNP (last 3 results) No results for input(s): PROBNP in the last 8760 hours. HbA1C: No results for input(s): HGBA1C in the last 72 hours. CBG: Recent Labs  Lab 07/03/21 0008  GLUCAP 229*   Lipid Profile: No results for input(s): CHOL, HDL, LDLCALC, TRIG, CHOLHDL, LDLDIRECT in the last 72 hours. Thyroid Function Tests: No results for input(s): TSH, T4TOTAL, FREET4, T3FREE, THYROIDAB in the last 72 hours. Anemia Panel: No results for input(s): VITAMINB12, FOLATE, FERRITIN, TIBC, IRON, RETICCTPCT in the last 72 hours. Urine analysis:    Component Value Date/Time   COLORURINE YELLOW 07/02/2021 2058   APPEARANCEUR CLOUDY (A) 07/02/2021 2058   LABSPEC 1.012 07/02/2021 2058   PHURINE 5.0 07/02/2021 2058   GLUCOSEU NEGATIVE 07/02/2021 2058   HGBUR NEGATIVE 07/02/2021 2058   BILIRUBINUR NEGATIVE 07/02/2021 2058   KETONESUR NEGATIVE 07/02/2021 2058   PROTEINUR NEGATIVE 07/02/2021 2058   UROBILINOGEN 0.2 05/15/2013 1100   NITRITE NEGATIVE 07/02/2021 2058   LEUKOCYTESUR LARGE (A) 07/02/2021 2058    Radiological Exams on Admission:  No results found.  Assessment/Plan Principal Problem:   UTI (urinary tract infection) Active Problems:   Anemia   History of renal transplant   HTN (hypertension)   Hyponatremia   Complicated UTI Patient is here for evaluation of urinary symptoms.  History of renal transplant on immunosuppressants and breast cancer currently on chemotherapy.  She was admitted last month for UTI and cultures grew ESBL and treated with a 2-week course of carbapenems. UA done at this time showing large amount of leukocytes, greater than 50 WBCs, and many bacteria. She does have leukocytosis (WBC 22.9) but no other signs of sepsis.  Lactic acid normal  x2. -Continue meropenem.  Urine culture pending.  Monitor WBC count.  Hyponatremia Corrected sodium 127, was 137 on labs done a few days ago. -Continue gentle IV fluid hydration and monitor BMP. Check serum osmolarity.  Right upper extremity skin lesion Area of erythema on the dorsum of the right hand streaking up to the forearm.  Last chemo was on 12/28 but erythema started 3 days ago.  Cellulitis less likely as the area of erythema is so well demarcated.  Possibly superficial vein thrombophlebitis but this area is not painful or tender to palpation. -Already receiving Carbapenem for UTI, continue to monitor  CKD stage IIIa History of renal transplant Creatinine currently 1.1, stable. -Continue mycophenolate, prednisone, and tacrolimus.  Monitor renal function.  Poorly controlled insulin-dependent diabetes mellitus Blood glucose currently in the low 200s.  A1c was 11.9 on 05/29/2021. -Continue home basal and preprandial insulin.  Sliding scale insulin sensitive ACHS.  Low carbohydrate diet.  Hypertension Blood pressure elevated with systolic currently in the 160s to 170s. -Continue Coreg.  Order hydralazine prn SBP >170.   Hyperlipidemia -Continue Crestor  Breast cancer Status post left lumpectomy and currently on chemotherapy.  Last chemo on 12/28. -Outpatient oncology follow-up  Anemia Thrombocytopenia Stable and likely due to chemotherapy.  No signs of active bleeding. -Continue to monitor  Depression -Continue Lexapro  Chronic pain/neuropathy -Continue gabapentin  GERD -Continue Protonix  DVT prophylaxis: Lovenox Code Status: Patient wishes to be full code. Family Communication: Daughter updated. Disposition Plan: Status is: Observation  The patient remains OBS appropriate and will d/c before 2 midnights.  Level of care: Level of care: Med-Surg  The medical decision making on this patient was of high complexity and the patient is at high risk for clinical  deterioration, therefore this is a level 3 visit.  Shela Leff MD [facility]  If 7PM-7AM, please contact night-coverage [email or web address]  07/03/2021, 2:36 AM

## 2021-07-04 DIAGNOSIS — E871 Hypo-osmolality and hyponatremia: Secondary | ICD-10-CM | POA: Diagnosis not present

## 2021-07-04 DIAGNOSIS — Z794 Long term (current) use of insulin: Secondary | ICD-10-CM

## 2021-07-04 DIAGNOSIS — E119 Type 2 diabetes mellitus without complications: Secondary | ICD-10-CM

## 2021-07-04 DIAGNOSIS — N390 Urinary tract infection, site not specified: Secondary | ICD-10-CM | POA: Diagnosis not present

## 2021-07-04 DIAGNOSIS — I10 Essential (primary) hypertension: Secondary | ICD-10-CM | POA: Diagnosis not present

## 2021-07-04 DIAGNOSIS — Z940 Kidney transplant status: Secondary | ICD-10-CM | POA: Diagnosis not present

## 2021-07-04 LAB — CBC WITH DIFFERENTIAL/PLATELET
Abs Immature Granulocytes: 1.23 10*3/uL — ABNORMAL HIGH (ref 0.00–0.07)
Basophils Absolute: 0.2 10*3/uL — ABNORMAL HIGH (ref 0.0–0.1)
Basophils Relative: 1 %
Eosinophils Absolute: 0.1 10*3/uL (ref 0.0–0.5)
Eosinophils Relative: 1 %
HCT: 30.9 % — ABNORMAL LOW (ref 36.0–46.0)
Hemoglobin: 10.1 g/dL — ABNORMAL LOW (ref 12.0–15.0)
Immature Granulocytes: 9 %
Lymphocytes Relative: 9 %
Lymphs Abs: 1.2 10*3/uL (ref 0.7–4.0)
MCH: 31.3 pg (ref 26.0–34.0)
MCHC: 32.7 g/dL (ref 30.0–36.0)
MCV: 95.7 fL (ref 80.0–100.0)
Monocytes Absolute: 0.8 10*3/uL (ref 0.1–1.0)
Monocytes Relative: 6 %
Neutro Abs: 9.6 10*3/uL — ABNORMAL HIGH (ref 1.7–7.7)
Neutrophils Relative %: 74 %
Platelets: 131 10*3/uL — ABNORMAL LOW (ref 150–400)
RBC: 3.23 MIL/uL — ABNORMAL LOW (ref 3.87–5.11)
RDW: 15.9 % — ABNORMAL HIGH (ref 11.5–15.5)
WBC: 13.1 10*3/uL — ABNORMAL HIGH (ref 4.0–10.5)
nRBC: 2.1 % — ABNORMAL HIGH (ref 0.0–0.2)

## 2021-07-04 LAB — MAGNESIUM: Magnesium: 1.7 mg/dL (ref 1.7–2.4)

## 2021-07-04 LAB — BASIC METABOLIC PANEL
Anion gap: 5 (ref 5–15)
BUN: 12 mg/dL (ref 6–20)
CO2: 23 mmol/L (ref 22–32)
Calcium: 9.3 mg/dL (ref 8.9–10.3)
Chloride: 107 mmol/L (ref 98–111)
Creatinine, Ser: 1.08 mg/dL — ABNORMAL HIGH (ref 0.44–1.00)
GFR, Estimated: >60 mL/min (ref >60)
Glucose, Bld: 119 mg/dL — ABNORMAL HIGH (ref 70–99)
Potassium: 4.9 mmol/L (ref 3.5–5.1)
Sodium: 135 mmol/L (ref 135–145)

## 2021-07-04 LAB — GLUCOSE, CAPILLARY
Glucose-Capillary: 101 mg/dL — ABNORMAL HIGH (ref 70–99)
Glucose-Capillary: 173 mg/dL — ABNORMAL HIGH (ref 70–99)
Glucose-Capillary: 232 mg/dL — ABNORMAL HIGH (ref 70–99)
Glucose-Capillary: 161 mg/dL — ABNORMAL HIGH (ref 70–99)

## 2021-07-04 MED ORDER — MAGNESIUM SULFATE 2 GM/50ML IV SOLN
2.0000 g | Freq: Once | INTRAVENOUS | Status: AC
  Administered 2021-07-04: 2 g via INTRAVENOUS
  Filled 2021-07-04: qty 50

## 2021-07-04 NOTE — Progress Notes (Signed)
Initial Nutrition Assessment  DOCUMENTATION CODES:   Obesity unspecified  INTERVENTION:   -Ensure Enlive po BID, each supplement provides 350 kcal and 20 grams of protein  NUTRITION DIAGNOSIS:   Increased nutrient needs related to cancer and cancer related treatments as evidenced by estimated needs.  GOAL:   Patient will meet greater than or equal to 90% of their needs  MONITOR:   PO intake, Supplement acceptance, Labs, Weight trends, I & O's  REASON FOR ASSESSMENT:   Malnutrition Screening Tool    ASSESSMENT:   51 year old female history of renal transplant on immunosuppressants, CKD stage IIIa, insulin-dependent diabetes mellitus, hypertension, hyperlipidemia, breast cancer status post left lumpectomy currently on chemotherapy hospitalized recently for ESBL E. coli bacteremia secondary to acute cystitis and treated with a 2-week course of carbapenems presenting back to the ED with urinary symptoms, right upper extremity rash.  Pt in room, making her bed. Pt reports her appetite is better now. She is consuming 100% of her meals at this time. Pt undergoing chemotherapy, last treatment 12/28. Pt states she tends to have poor appetite following her treatments for about 2 weeks then things improve. She reports some taste changes where foods are bland or extremely sweet or salty.  She is drinking her Ensure.  Pt reports UBW ~210 lbs. Per weight records, pt has lost 11 lbs since 11/8 (5% wt loss x 2 months, insignificant for time frame).   Medications: IV Mg sulfate  Labs reviewed: CBGs: 101-237  NUTRITION - FOCUSED PHYSICAL EXAM:  No depletions noted.  Diet Order:   Diet Order             Diet heart healthy/carb modified Room service appropriate? Yes; Fluid consistency: Thin  Diet effective now                   EDUCATION NEEDS:   No education needs have been identified at this time  Skin:  Skin Assessment: Reviewed RN Assessment  Last BM:  1/9  Height:    Ht Readings from Last 1 Encounters:  07/03/21 5\' 1"  (1.549 m)    Weight:   Wt Readings from Last 1 Encounters:  07/03/21 92.5 kg    BMI:  Body mass index is 38.53 kg/m.  Estimated Nutritional Needs:   Kcal:  1800-2000  Protein:  85-95g  Fluid:  2L/day  Clayton Bibles, MS, RD, LDN Inpatient Clinical Dietitian Contact information available via Amion

## 2021-07-04 NOTE — Progress Notes (Signed)
Cynthia [name]   DOB:01/14/1971   GE#:[id number]   MWN#:[id number]  Oncology follow up   Subjective: Patient is well-known to me, under my care for her breast cancer.  She is on adjuvant chemotherapy, last dose on 06/21/2021. She was admitted for recurrent UTI and rash on right arm secondary to chemo. She is feeling better today.    Objective:  Vitals:   07/04/21 1434 07/04/21 2036  BP: (!) 150/69 (!) 172/75  Pulse: 73 74  Resp: 16 20  Temp: 97.7 F (36.5 C) (!) 97.5 F (36.4 C)  SpO2: 100%     Body mass index is 38.53 kg/m.  Intake/Output Summary (Last 24 hours) at 07/04/2021 2216 Last data filed at 07/04/2021 1435 Gross per 24 hour  Intake 780 ml  Output --  Net 780 ml     Sclerae unicteric  Oropharynx clear  No peripheral adenopathy  Lungs clear -- no rales or rhonchi  Heart regular rate and rhythm  Abdomen benign  MSK no focal spinal tenderness, no peripheral edema  Neuro nonfocal    CBG (last 3)  Recent Labs    07/04/21 1143 07/04/21 1730 07/04/21 2030  GLUCAP 173* 232* 161*     Labs:  Urine Studies No results for input(s): UHGB, CRYS in the last 72 hours.  Invalid input(s): UACOL, UAPR, USPG, UPH, UTP, UGL, UKET, UBIL, UNIT, UROB, ULEU, UEPI, UWBC, URBC, UBAC, CAST, [place], [place]  Basic Metabolic Panel: Recent Labs  Lab 07/02/21 2058 07/03/21 0532 07/04/21 0947  NA 124* 128* 135  K 4.7 4.9 4.9  CL 97* 99 107  CO2 22 23 23   GLUCOSE 237* 184* 119*  BUN 13 11 12   CREATININE 1.17* 1.07* 1.08*  CALCIUM 9.2 9.2 9.3  MG 1.7  --  1.7   GFR Estimated Creatinine Clearance: 63.2 mL/min (A) (by C-G formula based on SCr of 1.08 mg/dL (H)). Liver Function Tests: Recent Labs  Lab 07/02/21 2058  AST 15  ALT 16  ALKPHOS 144*  BILITOT 0.5  PROT 6.0*  ALBUMIN 3.7   No results for input(s): LIPASE, AMYLASE in the last 168 hours. No results for input(s): AMMONIA in the last 168 hours. Coagulation profile No results for input(s): INR, PROTIME in the  last 168 hours.  CBC: Recent Labs  Lab 07/02/21 2058 07/03/21 0532 07/04/21 0947  WBC 22.9* 18.5* 13.1*  NEUTROABS 16.5*  --  9.6*  HGB 9.9* 9.6* 10.1*  HCT 29.1* 27.9* 30.9*  MCV 91.8 91.8 95.7  PLT 145* 128* 131*   Cardiac Enzymes: No results for input(s): CKTOTAL, CKMB, CKMBINDEX, TROPONINI in the last 168 hours. BNP: Invalid input(s): POCBNP CBG: Recent Labs  Lab 07/03/21 2140 07/04/21 0758 07/04/21 1143 07/04/21 1730 07/04/21 2030  GLUCAP 146* 101* 173* 232* 161*   D-Dimer No results for input(s): DDIMER in the last 72 hours. Hgb A1c No results for input(s): HGBA1C in the last 72 hours. Lipid Profile No results for input(s): CHOL, HDL, LDLCALC, TRIG, CHOLHDL, LDLDIRECT in the last 72 hours. Thyroid function studies No results for input(s): TSH, T4TOTAL, T3FREE, THYROIDAB in the last 72 hours.  Invalid input(s): FREET3 Anemia work up No results for input(s): VITAMINB12, FOLATE, FERRITIN, TIBC, IRON, RETICCTPCT in the last 72 hours. Microbiology Recent Results (from the past 240 hour(s))  Urine Culture     Status: Abnormal (Preliminary result)   Collection Time: 07/02/21  8:58 PM   Specimen: Urine, Clean Catch  Result Value Ref Range Status   Specimen Description  Final    URINE, CLEAN CATCH Performed at [facility], [place] [address], [place], [place] [address]    Special Requests   Final    NONE Performed at [facility], [place] [address] [place], [place] [address]    Culture (A)  Final    >=100,000 COLONIES/mL ESCHERICHIA COLI CULTURE REINCUBATED FOR BETTER GROWTH IDENTIFICATION AND SUSCEPTIBILITIES TO FOLLOW Performed at [facility], [place] [address] [place], [place] [address]    Report Status PENDING  Incomplete  Resp Panel by RT-PCR (Flu A&B, Covid) Nasopharyngeal Swab     Status: None   Collection Time: 07/02/21  8:59 PM   Specimen: Nasopharyngeal Swab; Nasopharyngeal(NP) swabs in vial transport medium   Result Value Ref Range Status   SARS Coronavirus 2 by RT PCR NEGATIVE NEGATIVE Final    Comment: (NOTE) SARS-CoV-2 target nucleic acids are NOT DETECTED.  The SARS-CoV-2 RNA is generally detectable in upper respiratory specimens during the acute phase of infection. The lowest concentration of SARS-CoV-2 viral copies this assay can detect is 138 copies/mL. A negative result does not preclude SARS-Cov-2 infection and should not be used as the sole basis for treatment or other patient management decisions. A negative result may occur with  improper specimen collection/handling, submission of specimen other than nasopharyngeal swab, presence of viral mutation(s) within the areas targeted by this assay, and inadequate number of viral copies(<138 copies/mL). A negative result must be combined with clinical observations, patient history, and epidemiological information. The expected result is Negative.  Fact Sheet for Patients:  EntrepreneurPulse.com.au  Fact Sheet for Healthcare Providers:  IncredibleEmployment.be  This test is no t yet approved or cleared by the [place] FDA and  has been authorized for detection and/or diagnosis of SARS-CoV-2 by FDA under an Emergency Use Authorization (EUA). This EUA will remain  in effect (meaning this test can be used) for the duration of the COVID-19 declaration under Section 564(b)(1) of the Act, 21 U.S.C.section 360bbb-3(b)(1), unless the authorization is terminated  or revoked sooner.       Influenza A by PCR NEGATIVE NEGATIVE Final   Influenza B by PCR NEGATIVE NEGATIVE Final    Comment: (NOTE) The Xpert Xpress SARS-CoV-2/FLU/RSV plus assay is intended as an aid in the diagnosis of influenza from Nasopharyngeal swab specimens and should not be used as a sole basis for treatment. Nasal washings and aspirates are unacceptable for Xpert Xpress SARS-CoV-2/FLU/RSV testing.  Fact Sheet for  Patients: EntrepreneurPulse.com.au  Fact Sheet for Healthcare Providers: IncredibleEmployment.be  This test is not yet approved or cleared by the [place] FDA and has been authorized for detection and/or diagnosis of SARS-CoV-2 by FDA under an Emergency Use Authorization (EUA). This EUA will remain in effect (meaning this test can be used) for the duration of the COVID-19 declaration under Section 564(b)(1) of the Act, 21 U.S.C. section 360bbb-3(b)(1), unless the authorization is terminated or revoked.  Performed at [facility], [place] [address], [place], [place] [address]       Studies:  No results found.  Assessment: 51 y.o. female   Recurrent UTI secondary to E Coli  Skin rash on RUE secondary to chemo  CKD stage III, s/p renal transplant  DM HTN Stage II breast cancer, on adjuvant chemo     Plan:  -she is on meropenem, managed by hospitalist team  -due to her recurrent UTI which is secondary to her compromised immune system from chemotherapy and immunosuppressant for renal transplant,  I recommend prophylactic Macrobid until she completes adjuvant chemotherapy (one more cycle on 1/18)  -ice bag to her rash on right arm -I will f/u as needed, will see her back next week 1/18 as scheduled.   Truitt Merle, MD 07/04/2021

## 2021-07-04 NOTE — Plan of Care (Signed)
°  Problem: Clinical Measurements: Goal: Ability to maintain clinical measurements within normal limits will improve Outcome: Progressing   Problem: Clinical Measurements: Goal: Diagnostic test results will improve Outcome: Progressing   Problem: Nutrition: Goal: Adequate nutrition will be maintained Outcome: Progressing

## 2021-07-04 NOTE — Progress Notes (Signed)
PROGRESS NOTE    Cynthia [name]  TGY:[id number] DOB: [birth year] DOA: 07/02/2021 PCP: Arthur Holms, NP    Chief Complaint  Patient presents with   Dysuria    Brief Narrative:  Patient is a 51 year old female history of renal transplant on immunosuppressants, CKD stage IIIa, insulin-dependent diabetes mellitus, hypertension, hyperlipidemia, breast cancer status post left lumpectomy currently on chemotherapy hospitalized recently for ESBL E. coli bacteremia secondary to acute cystitis and treated with a 2-week course of carbapenems presenting back to the ED with urinary symptoms, right upper extremity rash.  Noted to be hypertensive on presentation with a leukocytosis of 22.9.  Urinalysis consistent with UTI.  Urine cultures pending.  COVID-19 PCR, influenza PCR negative.  Patient placed empirically on IV Merrem.   Assessment & Plan:   Principal Problem:   UTI (urinary tract infection) Active Problems:   Anemia   History of renal transplant   HTN (hypertension)   Hyponatremia  #1 complicated UTI -Patient presenting with dysuria, suprapubic abdominal pain, urinalysis consistent with UTI. -Patient with history of renal transplant on immunosuppressant therapy as well as breast cancer on chemotherapy. -Patient noted to have recently been hospitalized last month with a ESBL E. coli bacteremia felt secondary to acute cystitis and treated with a 2-week course of carbapenems. -Patient noted on presentation to have urinalysis consistent with a UTI as well as a leukocytosis white count of 22.9. -Urine cultures with > 100,000 colonies of E. coli with sensitivities pending. -Continue IV Merrem until cultures are finalized. -Supportive care.  2.  Hyponatremia -Likely secondary to hypovolemic hyponatremia. -Improved with hydration sodium at 135.   -Follow.    3.  Right upper extremity skin lesion -Patient with an area of erythema on the dorsum of the right hand streaking up to the  forearm. -Last chemotherapy was 1228 but erythema started 3 days prior to admission however patient states that is the site where chemotherapy is given. -Patient with improvement with tenderness to palpation and itching on current regimen.  -Concern for possible superficial vein thrombophlebitis. -Continue empiric IV antibiotics, Benadryl cream as needed, scheduled NSAIDs for the next 3 to 5 days.   -Cold compresses.    4.  CKD stage IIIa/history of renal transplant -Stable. -Continue mycophenolate, prednisone, tacrolimus.  5.  Insulin-dependent diabetes mellitus type 2 Hemoglobin A1c 11.9 on 05/29/2021. -Patient chronically on steroids due to renal transplant. -CBG 101 this morning. -Continue Semglee, NovoLog meal coverage 8 units 3 times daily, SSI.    6.  Hypertension -Continue Coreg.  7.  Hyperlipidemia -Statin.  8.  History of breast cancer -Status post left lumpectomy on chemotherapy last chemotherapy 06/21/2021. -Oncology informed of admission via epic -Outpatient follow-up with oncology.  9.  Anemia/thrombocytopenia -Secondary to chemotherapy. -No overt bleeding. -Hemoglobin stable at 10.1. -Follow.  10.  Depression -Lexapro.  11.  GERD -PPI  12.  Chronic pain/neuropathy -Continue gabapentin   DVT prophylaxis: Lovenox Code Status: Full Family Communication: Updated patient.  No family at bedside. Disposition:   Status is: Inpatient  The patient will require care spanning > 2 midnights and should be moved to inpatient because: Severity of illness      Consultants:  Oncology informed via epic of admission.  Procedures:  None  Antimicrobials:  IV Merrem 07/03/2021>>>>>   Subjective: Patient laying in bed.  No chest pain.  No shortness of breath.  Abdominal pain improved.  Dysuria improving.  Tolerating oral intake.  Right upper extremity rash with improvement with itching and less tender to  palpation.  Asking when she is going to be able to go  home.   Objective: Vitals:   07/03/21 1035 07/03/21 1300 07/03/21 2054 07/04/21 0459  BP: (!) 134/53 (!) 142/66 (!) 167/63 (!) 148/60  Pulse: 72 70 73 79  Resp: 16 16 16 16   Temp: 98.1 F (36.7 C) 98.6 F (37 C) 97.7 F (36.5 C) 97.9 F (36.6 C)  TempSrc:   Oral Oral  SpO2: 96% 97% 97% 97%  Weight:      Height:        Intake/Output Summary (Last 24 hours) at 07/04/2021 1213 Last data filed at 07/04/2021 1103 Gross per 24 hour  Intake 1260 ml  Output --  Net 1260 ml    Filed Weights   07/03/21 0027  Weight: 92.5 kg    Examination:  General exam: NAD. Respiratory system: Clear to auscultation bilaterally, no wheezes, no crackles, no rhonchi.  Normal respiratory effort.  Speaking in full sentences.   Cardiovascular system: Regular rate rhythm no murmurs rubs or gallops.  No JVD.  No lower extremity edema.   Gastrointestinal system: Abdomen is soft, nondistended, decreased tenderness to palpation suprapubic region.  Positive bowel sounds.  No rebound.  No guarding.  Central nervous system: Alert and oriented. No focal neurological deficits. Extremities: Symmetric 5 x 5 power. Skin: No rashes, lesions or ulcers Psychiatry: Judgement and insight appear normal. Mood & affect appropriate.       Data Reviewed: I have personally reviewed following labs and imaging studies  CBC: Recent Labs  Lab 07/02/21 2058 07/03/21 0532 07/04/21 0947  WBC 22.9* 18.5* 13.1*  NEUTROABS 16.5*  --  9.6*  HGB 9.9* 9.6* 10.1*  HCT 29.1* 27.9* 30.9*  MCV 91.8 91.8 95.7  PLT 145* 128* 131*     Basic Metabolic Panel: Recent Labs  Lab 07/02/21 2058 07/03/21 0532 07/04/21 0947  NA 124* 128* 135  K 4.7 4.9 4.9  CL 97* 99 107  CO2 22 23 23   GLUCOSE 237* 184* 119*  BUN 13 11 12   CREATININE 1.17* 1.07* 1.08*  CALCIUM 9.2 9.2 9.3  MG 1.7  --  1.7     GFR: Estimated Creatinine Clearance: 63.2 mL/min (A) (by C-G formula based on SCr of 1.08 mg/dL (H)).  Liver Function  Tests: Recent Labs  Lab 07/02/21 2058  AST 15  ALT 16  ALKPHOS 144*  BILITOT 0.5  PROT 6.0*  ALBUMIN 3.7     CBG: Recent Labs  Lab 07/03/21 1135 07/03/21 1748 07/03/21 2140 07/04/21 0758 07/04/21 1143  GLUCAP 131* 237* 146* 101* 173*      Recent Results (from the past 240 hour(s))  Urine Culture     Status: Abnormal (Preliminary result)   Collection Time: 07/02/21  8:58 PM   Specimen: Urine, Clean Catch  Result Value Ref Range Status   Specimen Description   Final    URINE, CLEAN CATCH Performed at [facility], [place] [address], [place], [place] [address]    Special Requests   Final    NONE Performed at [facility], [place] [address] [place], [place] [address]    Culture (A)  Final    >=100,000 COLONIES/mL ESCHERICHIA COLI CULTURE REINCUBATED FOR BETTER GROWTH IDENTIFICATION AND SUSCEPTIBILITIES TO FOLLOW Performed at [facility], [place] [address], [place], [place] [address]    Report Status PENDING  Incomplete  Resp Panel by RT-PCR (Flu A&B, Covid) Nasopharyngeal Swab     Status: None  Collection Time: 07/02/21  8:59 PM   Specimen: Nasopharyngeal Swab; Nasopharyngeal(NP) swabs in vial transport medium  Result Value Ref Range Status   SARS Coronavirus 2 by RT PCR NEGATIVE NEGATIVE Final    Comment: (NOTE) SARS-CoV-2 target nucleic acids are NOT DETECTED.  The SARS-CoV-2 RNA is generally detectable in upper respiratory specimens during the acute phase of infection. The lowest concentration of SARS-CoV-2 viral copies this assay can detect is 138 copies/mL. A negative result does not preclude SARS-Cov-2 infection and should not be used as the sole basis for treatment or other patient management decisions. A negative result may occur with  improper specimen collection/handling, submission of specimen other than nasopharyngeal swab, presence of viral mutation(s) within the areas targeted by this assay, and inadequate  number of viral copies(<138 copies/mL). A negative result must be combined with clinical observations, patient history, and epidemiological information. The expected result is Negative.  Fact Sheet for Patients:  EntrepreneurPulse.com.au  Fact Sheet for Healthcare Providers:  IncredibleEmployment.be  This test is no t yet approved or cleared by the [place] FDA and  has been authorized for detection and/or diagnosis of SARS-CoV-2 by FDA under an Emergency Use Authorization (EUA). This EUA will remain  in effect (meaning this test can be used) for the duration of the COVID-19 declaration under Section 564(b)(1) of the Act, 21 U.S.C.section 360bbb-3(b)(1), unless the authorization is terminated  or revoked sooner.       Influenza A by PCR NEGATIVE NEGATIVE Final   Influenza B by PCR NEGATIVE NEGATIVE Final    Comment: (NOTE) The Xpert Xpress SARS-CoV-2/FLU/RSV plus assay is intended as an aid in the diagnosis of influenza from Nasopharyngeal swab specimens and should not be used as a sole basis for treatment. Nasal washings and aspirates are unacceptable for Xpert Xpress SARS-CoV-2/FLU/RSV testing.  Fact Sheet for Patients: EntrepreneurPulse.com.au  Fact Sheet for Healthcare Providers: IncredibleEmployment.be  This test is not yet approved or cleared by the [place] FDA and has been authorized for detection and/or diagnosis of SARS-CoV-2 by FDA under an Emergency Use Authorization (EUA). This EUA will remain in effect (meaning this test can be used) for the duration of the COVID-19 declaration under Section 564(b)(1) of the Act, 21 U.S.C. section 360bbb-3(b)(1), unless the authorization is terminated or revoked.  Performed at [facility], [place] [address], [place], [place] [address]           Radiology Studies: No results found.      Scheduled Meds:  aspirin EC   81 mg Oral QHS   carvedilol  12.5 mg Oral BID WC   enoxaparin (LOVENOX) injection  45 mg Subcutaneous Q24H   escitalopram  10 mg Oral Daily   feeding supplement  237 mL Oral BID BM   gabapentin  600 mg Oral BID   ibuprofen  400 mg Oral TID WC   insulin aspart  0-5 Units Subcutaneous QHS   insulin aspart  0-9 Units Subcutaneous TID WC   insulin aspart  5 Units Subcutaneous TID WC   insulin glargine-yfgn  20 Units Subcutaneous BID   mycophenolate  360 mg Oral BID   pantoprazole  40 mg Oral Daily   predniSONE  5 mg Oral Q breakfast   rosuvastatin  5 mg Oral QHS   tacrolimus  3 mg Oral Daily   Continuous Infusions:  magnesium sulfate bolus IVPB     meropenem (MERREM) IV 1 g (07/04/21 0810)     LOS: 1 day  Time spent: 35 minutes    Irine Seal, MD [facility]   To contact the attending provider between 7A-7P or the covering provider during after hours 7P-7A, please log into the web site [email or web address] and access using universal [place] password for that web site. If you do not have the password, please call the hospital operator.  07/04/2021, 12:13 PM

## 2021-07-05 DIAGNOSIS — Z940 Kidney transplant status: Secondary | ICD-10-CM | POA: Diagnosis not present

## 2021-07-05 DIAGNOSIS — N3000 Acute cystitis without hematuria: Secondary | ICD-10-CM | POA: Diagnosis not present

## 2021-07-05 LAB — BASIC METABOLIC PANEL
Anion gap: 3 — ABNORMAL LOW (ref 5–15)
BUN: 17 mg/dL (ref 6–20)
CO2: 23 mmol/L (ref 22–32)
Calcium: 9 mg/dL (ref 8.9–10.3)
Chloride: 105 mmol/L (ref 98–111)
Creatinine, Ser: 0.99 mg/dL (ref 0.44–1.00)
GFR, Estimated: >60 mL/min (ref >60)
Glucose, Bld: 126 mg/dL — ABNORMAL HIGH (ref 70–99)
Potassium: 5.1 mmol/L (ref 3.5–5.1)
Sodium: 131 mmol/L — ABNORMAL LOW (ref 135–145)

## 2021-07-05 LAB — CBC WITH DIFFERENTIAL/PLATELET
Abs Immature Granulocytes: 0.8 10*3/uL — ABNORMAL HIGH (ref 0.00–0.07)
Basophils Absolute: 0.2 10*3/uL — ABNORMAL HIGH (ref 0.0–0.1)
Basophils Relative: 1 %
Eosinophils Absolute: 0.1 10*3/uL (ref 0.0–0.5)
Eosinophils Relative: 1 %
HCT: 28.3 % — ABNORMAL LOW (ref 36.0–46.0)
Hemoglobin: 9.6 g/dL — ABNORMAL LOW (ref 12.0–15.0)
Immature Granulocytes: 7 %
Lymphocytes Relative: 15 %
Lymphs Abs: 1.7 10*3/uL (ref 0.7–4.0)
MCH: 31.6 pg (ref 26.0–34.0)
MCHC: 33.9 g/dL (ref 30.0–36.0)
MCV: 93.1 fL (ref 80.0–100.0)
Monocytes Absolute: 1.1 10*3/uL — ABNORMAL HIGH (ref 0.1–1.0)
Monocytes Relative: 9 %
Neutro Abs: 7.8 10*3/uL — ABNORMAL HIGH (ref 1.7–7.7)
Neutrophils Relative %: 67 %
Platelets: 111 10*3/uL — ABNORMAL LOW (ref 150–400)
RBC: 3.04 MIL/uL — ABNORMAL LOW (ref 3.87–5.11)
RDW: 15.8 % — ABNORMAL HIGH (ref 11.5–15.5)
WBC: 11.6 10*3/uL — ABNORMAL HIGH (ref 4.0–10.5)
nRBC: 2.5 % — ABNORMAL HIGH (ref 0.0–0.2)

## 2021-07-05 LAB — URINE CULTURE: Culture: >=100000 — AB

## 2021-07-05 LAB — GLUCOSE, CAPILLARY
Glucose-Capillary: 77 mg/dL (ref 70–99)
Glucose-Capillary: 119 mg/dL — ABNORMAL HIGH (ref 70–99)

## 2021-07-05 LAB — TACROLIMUS LEVEL: Tacrolimus (FK506) - LabCorp: 4.8 ng/mL (ref 2.0–20.0)

## 2021-07-05 LAB — MAGNESIUM: Magnesium: 1.9 mg/dL (ref 1.7–2.4)

## 2021-07-05 MED ORDER — INSULIN GLARGINE-YFGN 100 UNIT/ML ~~LOC~~ SOLN
15.0000 [IU] | Freq: Two times a day (BID) | SUBCUTANEOUS | Status: DC
  Filled 2021-07-05: qty 0.15

## 2021-07-05 MED ORDER — INSULIN ASPART 100 UNIT/ML IJ SOLN
8.0000 [IU] | Freq: Three times a day (TID) | INTRAMUSCULAR | Status: DC
  Administered 2021-07-05: 8 [IU] via SUBCUTANEOUS

## 2021-07-05 MED ORDER — SULFAMETHOXAZOLE-TRIMETHOPRIM 800-160 MG PO TABS: 1.0000 | ORAL_TABLET | Freq: Two times a day (BID) | ORAL | 0 refills | Status: AC

## 2021-07-05 MED ORDER — NITROFURANTOIN MONOHYD MACRO 100 MG PO CAPS: 100.0000 mg | ORAL_CAPSULE | Freq: Two times a day (BID) | ORAL | 0 refills | Status: DC

## 2021-07-05 NOTE — Progress Notes (Addendum)
°  Transition of Care [facility]) Screening Note   Patient Details  Name: Cynthia [name] Date of Birth: [birth year]   Transition of Care [facility]) CM/SW Contact:    Thorin Starner, Marjie Skiff, RN Phone Number: 07/05/2021, 2:48 PM    Transition of Care Department [facility]) has reviewed patient and no TOC needs have been identified at this time. We will continue to monitor patient advancement through interdisciplinary progression rounds. If new patient transition needs arise, please place a TOC consult.   30 Day Unplanned Readmission Risk Score    Flowsheet Row ED to Hosp-Admission (Current) from 07/02/2021 in [facility] ONCOL[facility]  30 Day Unplanned Readmission Risk Score (%) 30.43 Filed at 07/05/2021 1200       This score is the patient's risk of an unplanned readmission within 30 days of being discharged (0 -100%). The score is based on dignosis, age, lab data, medications, orders, and past utilization.   Low:  0-14.9   Medium: 15-21.9   High: 22-29.9   Extreme: 30 and above         Readmission Risk Prevention Plan 07/05/2021 07/05/2021 06/02/2021  Transportation Screening Complete Complete Complete  PCP or Specialist Appt within 3-5 Days - - Complete  HRI or Home Care Consult - - Complete  Social Work Consult for [place] Planning/Counseling - - Complete  Palliative Care Screening - - Not Applicable  Medication Review [employer] photographer) Complete Complete Complete  PCP or Specialist appointment within 3-5 days of discharge Complete Complete -  [place] or Home Care Consult Complete Complete -  SW Recovery Care/Counseling Consult Complete Complete -  Palliative Care Screening Not Applicable Not Applicable -  Skilled Nursing Facility Not Applicable Complete -  Some recent data might be hidden

## 2021-07-05 NOTE — Discharge Summary (Signed)
Physician Discharge Summary  Cynthia [name] MWU:[id number] DOB: 1970-06-30 DOA: 07/02/2021  PCP: Arthur Holms, NP  Admit date: 07/02/2021 Discharge date: 07/05/2021  Admitted From: Home Disposition:  Home  Recommendations for Outpatient Follow-up:  Follow up with PCP in 1-2 weeks Follow up with Oncology as scheduled  Discharge Condition:Stable CODE STATUS:Full Diet recommendation: Diabetic   Brief/Interim Summary: 51 year old female history of renal transplant on immunosuppressants, CKD stage IIIa, insulin-dependent diabetes mellitus, hypertension, hyperlipidemia, breast cancer status post left lumpectomy currently on chemotherapy hospitalized recently for ESBL E. coli bacteremia secondary to acute cystitis and treated with a 2-week course of carbapenems presenting back to the ED with urinary symptoms, right upper extremity rash.  Noted to be hypertensive on presentation with a leukocytosis of 22.9.  Urinalysis consistent with UTI.  Urine cultures pending.  COVID-19 PCR, influenza PCR negative.  Patient placed empirically on IV Merrem.  Discharge Diagnoses:  Principal Problem:   UTI (urinary tract infection) Active Problems:   Anemia   History of renal transplant   HTN (hypertension)   Hyponatremia   #1 complicated UTI -Patient presenting with dysuria, suprapubic abdominal pain, urinalysis consistent with UTI. -Patient with history of renal transplant on immunosuppressant therapy as well as breast cancer on chemotherapy. -Patient noted to have recently been hospitalized last month with a ESBL E. coli bacteremia felt secondary to acute cystitis and treated with a 2-week course of carbapenems. -Patient noted on presentation to have urinalysis consistent with a UTI as well as a leukocytosis white count of 22.9. -Urine cultures with > 100,000 colonies of E. coli with sensitivities resistant to ampicillin, cefazolin, rocephin, cipro, gentamycin, intermediate sensitivity to cefepime, sensitive  to all others -Was continued on IV Merrem, to complete bactrim on d/c and to continue prophylactic nitrofurantoin afterwards  2.  Hyponatremia -Likely secondary to hypovolemic hyponatremia. -Improved with hydration  -remained asymptomatic  3.  Right upper extremity skin lesion -Patient with an area of erythema on the dorsum of the right hand streaking up to the forearm. -Last chemotherapy was 1228 but erythema started 3 days prior to admission however patient states that is the site where chemotherapy is given. -Patient with improvement with tenderness to palpation and itching on current regimen.  -Concern for possible superficial vein thrombophlebitis. -Continued empiric antibiotics, Benadryl cream as needed -Cold compresses.    4.  CKD stage IIIa/history of renal transplant -Stable. -Continue mycophenolate, prednisone, tacrolimus.  5.  Insulin-dependent diabetes mellitus type 2 Hemoglobin A1c 11.9 on 05/29/2021. -Patient chronically on steroids due to renal transplant. -Given long and short acting insulin with SSI coverage while in hospital   6.  Hypertension -Continue Coreg.  7.  Hyperlipidemia -Statin.  8.  History of breast cancer -Status post left lumpectomy on chemotherapy last chemotherapy 06/21/2021. -Oncology informed of admission via epic -Pt to f/u with Oncology as outpatient  9.  Anemia/thrombocytopenia -Secondary to chemotherapy. -No overt bleeding. -Hemoglobin remained stable  10.  Depression -Lexapro.  11.  GERD -PPI  12.  Chronic pain/neuropathy -Continue gabapentin  Discharge Instructions   Allergies as of 07/05/2021       Reactions   Other Rash   Burns skin.  Please use paper tape only Burns skin.  Please use paper tape only   Docetaxel Nausea Only   Diaphoretic.  Patient had hypersensitivity reaction to docetaxel.  See progress note from 05/24/2021.  Patient able to complete infusion.   Norvasc [amlodipine] Swelling   Tape Other (See  Comments)   Burns skin.  Please use  paper tape only        Medication List     STOP taking these medications    dexamethasone 4 MG tablet Commonly known as: DECADRON   prochlorperazine 10 MG tablet Commonly known as: COMPAZINE   valACYclovir 1000 MG tablet Commonly known as: VALTREX       TAKE these medications    Accu-Chek Aviva Connect w/Device Kit Check sugar 1x daily What changed:  how much to take how to take this when to take this   Accu-Chek Aviva Plus test strip Generic drug: glucose blood Check sugar 1x daily What changed:  how much to take how to take this when to take this   Accu-Chek Softclix Lancets lancets Use as instructed What changed:  how much to take how to take this when to take this   acetaminophen 500 MG tablet Commonly known as: TYLENOL Take 500 mg by mouth every 6 (six) hours as needed for mild pain, fever or headache.   aspirin 81 MG EC tablet Take 1 tablet (81 mg total) by mouth daily. What changed: when to take this   carvedilol 25 MG tablet Commonly known as: COREG Take 12.5 mg by mouth 2 (two) times daily with a meal.   escitalopram 10 MG tablet Commonly known as: LEXAPRO Take 10 mg by mouth daily.   gabapentin 600 MG tablet Commonly known as: NEURONTIN Take 600 mg by mouth 2 (two) times daily.   Lantus SoloStar 100 UNIT/ML Solostar Pen Generic drug: insulin glargine Inject 25 Units into the skin 2 (two) times daily. What changed: how much to take   Mounjaro 2.5 MG/0.5ML Pen Generic drug: tirzepatide Inject 2.5 mg into the skin once a week.   mycophenolate 180 MG EC tablet Commonly known as: MYFORTIC Take 360 mg by mouth 2 (two) times daily.   nitrofurantoin (macrocrystal-monohydrate) 100 MG capsule Commonly known as: Macrobid Take 1 capsule (100 mg total) by mouth 2 (two) times daily. Start after completing course of bactrim (07/10/21) Start taking on: July 10, 2021 What changed:  additional  instructions These instructions start on July 10, 2021. If you are unsure what to do until then, ask your doctor or other care provider.   NovoLOG FlexPen 100 UNIT/ML FlexPen Generic drug: insulin aspart Inject 5 Units into the skin 3 (three) times daily.   ondansetron 8 MG tablet Commonly known as: Zofran Take 1 tablet (8 mg total) by mouth 2 (two) times daily as needed for refractory nausea / vomiting. Start on day 3 after chemo.   pantoprazole 40 MG tablet Commonly known as: PROTONIX Take 40 mg by mouth daily.   Pen Needles 30G X 8 MM Misc 1 each by Does not apply route daily. E11.9   predniSONE 5 MG tablet Commonly known as: DELTASONE Take 5 mg by mouth daily.   rosuvastatin 5 MG tablet Commonly known as: CRESTOR Take 5 mg by mouth at bedtime.   sulfamethoxazole-trimethoprim 800-160 MG tablet Commonly known as: BACTRIM DS Take 1 tablet by mouth 2 (two) times daily for 4 days.   tacrolimus 1 MG capsule Commonly known as: PROGRAF Take 3 mg by mouth daily.        Follow-up Information     Arthur Holms, NP Follow up in 2 week(s).   Specialty: Nurse Practitioner Why: Hospital follow up Contact information: [place] [address] [phone]         Buford Dresser, MD .   Specialty: Cardiology Contact information: [address]  [address] [place] [address] [phone]                Allergies  Allergen Reactions   Other Rash    Burns skin.  Please use paper tape only Burns skin.  Please use paper tape only   Docetaxel Nausea Only    Diaphoretic.  Patient had hypersensitivity reaction to docetaxel.  See progress note from 05/24/2021.  Patient able to complete infusion.   Norvasc [Amlodipine] Swelling   Tape Other (See Comments)    Burns skin.  Please use paper tape only    Consultations: Oncology  Procedures/Studies: No results found.  Subjective: Eager to go home  Discharge Exam: Vitals:   07/04/21  2036 07/05/21 0447  BP: (!) 172/75 (!) 160/64  Pulse: 74 75  Resp: 20 16  Temp: (!) 97.5 F (36.4 C) 98.2 F (36.8 C)  SpO2:  97%   Vitals:   07/04/21 0459 07/04/21 1434 07/04/21 2036 07/05/21 0447  BP: (!) 148/60 (!) 150/69 (!) 172/75 (!) 160/64  Pulse: 79 73 74 75  Resp: '16 16 20 16  ' Temp: 97.9 F (36.6 C) 97.7 F (36.5 C) (!) 97.5 F (36.4 C) 98.2 F (36.8 C)  TempSrc: Oral Oral Oral Oral  SpO2: 97% 100%  97%  Weight:      Height:        General: Pt is alert, awake, not in acute distress Cardiovascular: RRR, S1/S2 + Respiratory: CTA bilaterally, no wheezing, no rhonchi Abdominal: Soft, NT, ND, bowel sounds + Extremities: no edema, no cyanosis   The results of significant diagnostics from this hospitalization (including imaging, microbiology, ancillary and laboratory) are listed below for reference.     Microbiology: Recent Results (from the past 240 hour(s))  Urine Culture     Status: Abnormal   Collection Time: 07/02/21  8:58 PM   Specimen: Urine, Clean Catch  Result Value Ref Range Status   Specimen Description   Final    URINE, CLEAN CATCH Performed at [facility], [place] [address], [place], [place] [address]    Special Requests   Final    NONE Performed at [facility], [place] [address] [place], [place] [address]    Culture (A)  Final    >=100,000 COLONIES/mL ESCHERICHIA COLI Confirmed Extended Spectrum Beta-Lactamase Producer (ESBL).  In bloodstream infections from ESBL organisms, carbapenems are preferred over piperacillin/tazobactam. They are shown to have a lower risk of mortality.    Report Status 07/05/2021 FINAL  Final   Organism ID, Bacteria ESCHERICHIA COLI (A)  Final      Susceptibility   Escherichia coli - MIC*    AMPICILLIN >=32 RESISTANT Resistant     CEFAZOLIN >=64 RESISTANT Resistant     CEFEPIME 8 INTERMEDIATE Intermediate     CEFTRIAXONE >=64 RESISTANT Resistant     CIPROFLOXACIN >=4 RESISTANT  Resistant     GENTAMICIN >=16 RESISTANT Resistant     IMIPENEM <=0.25 SENSITIVE Sensitive     NITROFURANTOIN <=16 SENSITIVE Sensitive     TRIMETH/SULFA <=20 SENSITIVE Sensitive     AMPICILLIN/SULBACTAM 4 SENSITIVE Sensitive     PIP/TAZO <=4 SENSITIVE Sensitive     * >=100,000 COLONIES/mL ESCHERICHIA COLI  Resp Panel by RT-PCR (Flu A&B, Covid) Nasopharyngeal Swab     Status: None   Collection Time: 07/02/21  8:59 PM   Specimen: Nasopharyngeal Swab; Nasopharyngeal(NP) swabs in vial transport medium  Result Value Ref Range Status   SARS Coronavirus 2 by RT PCR NEGATIVE  NEGATIVE Final    Comment: (NOTE) SARS-CoV-2 target nucleic acids are NOT DETECTED.  The SARS-CoV-2 RNA is generally detectable in upper respiratory specimens during the acute phase of infection. The lowest concentration of SARS-CoV-2 viral copies this assay can detect is 138 copies/mL. A negative result does not preclude SARS-Cov-2 infection and should not be used as the sole basis for treatment or other patient management decisions. A negative result may occur with  improper specimen collection/handling, submission of specimen other than nasopharyngeal swab, presence of viral mutation(s) within the areas targeted by this assay, and inadequate number of viral copies(<138 copies/mL). A negative result must be combined with clinical observations, patient history, and epidemiological information. The expected result is Negative.  Fact Sheet for Patients:  EntrepreneurPulse.com.au  Fact Sheet for Healthcare Providers:  IncredibleEmployment.be  This test is no t yet approved or cleared by the [place] FDA and  has been authorized for detection and/or diagnosis of SARS-CoV-2 by FDA under an Emergency Use Authorization (EUA). This EUA will remain  in effect (meaning this test can be used) for the duration of the COVID-19 declaration under Section 564(b)(1) of the Act,  21 U.S.C.section 360bbb-3(b)(1), unless the authorization is terminated  or revoked sooner.       Influenza A by PCR NEGATIVE NEGATIVE Final   Influenza B by PCR NEGATIVE NEGATIVE Final    Comment: (NOTE) The Xpert Xpress SARS-CoV-2/FLU/RSV plus assay is intended as an aid in the diagnosis of influenza from Nasopharyngeal swab specimens and should not be used as a sole basis for treatment. Nasal washings and aspirates are unacceptable for Xpert Xpress SARS-CoV-2/FLU/RSV testing.  Fact Sheet for Patients: EntrepreneurPulse.com.au  Fact Sheet for Healthcare Providers: IncredibleEmployment.be  This test is not yet approved or cleared by the [place] FDA and has been authorized for detection and/or diagnosis of SARS-CoV-2 by FDA under an Emergency Use Authorization (EUA). This EUA will remain in effect (meaning this test can be used) for the duration of the COVID-19 declaration under Section 564(b)(1) of the Act, 21 U.S.C. section 360bbb-3(b)(1), unless the authorization is terminated or revoked.  Performed at [facility] [place], [place] [address], [place], [place] [address]      Labs: BNP (last 3 results) No results for input(s): BNP in the last 8760 hours. Basic Metabolic Panel: Recent Labs  Lab 07/02/21 2058 07/03/21 0532 07/04/21 0947 07/05/21 0505  NA 124* 128* 135 131*  K 4.7 4.9 4.9 5.1  CL 97* 99 107 105  CO2 '22 23 23 23  ' GLUCOSE 237* 184* 119* 126*  BUN '13 11 12 17  ' CREATININE 1.17* 1.07* 1.08* 0.99  CALCIUM 9.2 9.2 9.3 9.0  MG 1.7  --  1.7 1.9   Liver Function Tests: Recent Labs  Lab 07/02/21 2058  AST 15  ALT 16  ALKPHOS 144*  BILITOT 0.5  PROT 6.0*  ALBUMIN 3.7   No results for input(s): LIPASE, AMYLASE in the last 168 hours. No results for input(s): AMMONIA in the last 168 hours. CBC: Recent Labs  Lab 07/02/21 2058 07/03/21 0532 07/04/21 0947 07/05/21 0505  WBC 22.9* 18.5* 13.1* 11.6*   NEUTROABS 16.5*  --  9.6* 7.8*  HGB 9.9* 9.6* 10.1* 9.6*  HCT 29.1* 27.9* 30.9* 28.3*  MCV 91.8 91.8 95.7 93.1  PLT 145* 128* 131* 111*   Cardiac Enzymes: No results for input(s): CKTOTAL, CKMB, CKMBINDEX, TROPONINI in the last 168 hours. BNP: Invalid input(s): POCBNP CBG: Recent Labs  Lab 07/04/21 1143 07/04/21 1730  07/04/21 2030 07/05/21 0802 07/05/21 1225  GLUCAP 173* 232* 161* 77 119*   D-Dimer No results for input(s): DDIMER in the last 72 hours. Hgb A1c No results for input(s): HGBA1C in the last 72 hours. Lipid Profile No results for input(s): CHOL, HDL, LDLCALC, TRIG, CHOLHDL, LDLDIRECT in the last 72 hours. Thyroid function studies No results for input(s): TSH, T4TOTAL, T3FREE, THYROIDAB in the last 72 hours.  Invalid input(s): FREET3 Anemia work up No results for input(s): VITAMINB12, FOLATE, FERRITIN, TIBC, IRON, RETICCTPCT in the last 72 hours. Urinalysis    Component Value Date/Time   COLORURINE YELLOW 07/02/2021 2058   APPEARANCEUR CLOUDY (A) 07/02/2021 2058   LABSPEC 1.012 07/02/2021 2058   PHURINE 5.0 07/02/2021 2058   GLUCOSEU NEGATIVE 07/02/2021 2058   HGBUR NEGATIVE 07/02/2021 2058   BILIRUBINUR NEGATIVE 07/02/2021 2058   KETONESUR NEGATIVE 07/02/2021 2058   PROTEINUR NEGATIVE 07/02/2021 2058   UROBILINOGEN 0.2 05/15/2013 1100   NITRITE NEGATIVE 07/02/2021 2058   LEUKOCYTESUR LARGE (A) 07/02/2021 2058   Sepsis Labs Invalid input(s): PROCALCITONIN,  WBC,  LACTICIDVEN Microbiology Recent Results (from the past 240 hour(s))  Urine Culture     Status: Abnormal   Collection Time: 07/02/21  8:58 PM   Specimen: Urine, Clean Catch  Result Value Ref Range Status   Specimen Description   Final    URINE, CLEAN CATCH Performed at [facility], [place] [address], [place], [place] [address]    Special Requests   Final    NONE Performed at [facility], [place] [address], [place], [place] [address]    Culture (A)   Final    >=100,000 COLONIES/mL ESCHERICHIA COLI Confirmed Extended Spectrum Beta-Lactamase Producer (ESBL).  In bloodstream infections from ESBL organisms, carbapenems are preferred over piperacillin/tazobactam. They are shown to have a lower risk of mortality.    Report Status 07/05/2021 FINAL  Final   Organism ID, Bacteria ESCHERICHIA COLI (A)  Final      Susceptibility   Escherichia coli - MIC*    AMPICILLIN >=32 RESISTANT Resistant     CEFAZOLIN >=64 RESISTANT Resistant     CEFEPIME 8 INTERMEDIATE Intermediate     CEFTRIAXONE >=64 RESISTANT Resistant     CIPROFLOXACIN >=4 RESISTANT Resistant     GENTAMICIN >=16 RESISTANT Resistant     IMIPENEM <=0.25 SENSITIVE Sensitive     NITROFURANTOIN <=16 SENSITIVE Sensitive     TRIMETH/SULFA <=20 SENSITIVE Sensitive     AMPICILLIN/SULBACTAM 4 SENSITIVE Sensitive     PIP/TAZO <=4 SENSITIVE Sensitive     * >=100,000 COLONIES/mL ESCHERICHIA COLI  Resp Panel by RT-PCR (Flu A&B, Covid) Nasopharyngeal Swab     Status: None   Collection Time: 07/02/21  8:59 PM   Specimen: Nasopharyngeal Swab; Nasopharyngeal(NP) swabs in vial transport medium  Result Value Ref Range Status   SARS Coronavirus 2 by RT PCR NEGATIVE NEGATIVE Final    Comment: (NOTE) SARS-CoV-2 target nucleic acids are NOT DETECTED.  The SARS-CoV-2 RNA is generally detectable in upper respiratory specimens during the acute phase of infection. The lowest concentration of SARS-CoV-2 viral copies this assay can detect is 138 copies/mL. A negative result does not preclude SARS-Cov-2 infection and should not be used as the sole basis for treatment or other patient management decisions. A negative result may occur with  improper specimen collection/handling, submission of specimen other than nasopharyngeal swab, presence of viral mutation(s) within the areas targeted by this assay, and inadequate number of viral copies(<138 copies/mL). A negative result  must be combined with clinical  observations, patient history, and epidemiological information. The expected result is Negative.  Fact Sheet for Patients:  EntrepreneurPulse.com.au  Fact Sheet for Healthcare Providers:  IncredibleEmployment.be  This test is no t yet approved or cleared by the [place] FDA and  has been authorized for detection and/or diagnosis of SARS-CoV-2 by FDA under an Emergency Use Authorization (EUA). This EUA will remain  in effect (meaning this test can be used) for the duration of the COVID-19 declaration under Section 564(b)(1) of the Act, 21 U.S.C.section 360bbb-3(b)(1), unless the authorization is terminated  or revoked sooner.       Influenza A by PCR NEGATIVE NEGATIVE Final   Influenza B by PCR NEGATIVE NEGATIVE Final    Comment: (NOTE) The Xpert Xpress SARS-CoV-2/FLU/RSV plus assay is intended as an aid in the diagnosis of influenza from Nasopharyngeal swab specimens and should not be used as a sole basis for treatment. Nasal washings and aspirates are unacceptable for Xpert Xpress SARS-CoV-2/FLU/RSV testing.  Fact Sheet for Patients: EntrepreneurPulse.com.au  Fact Sheet for Healthcare Providers: IncredibleEmployment.be  This test is not yet approved or cleared by the [place] FDA and has been authorized for detection and/or diagnosis of SARS-CoV-2 by FDA under an Emergency Use Authorization (EUA). This EUA will remain in effect (meaning this test can be used) for the duration of the COVID-19 declaration under Section 564(b)(1) of the Act, 21 U.S.C. section 360bbb-3(b)(1), unless the authorization is terminated or revoked.  Performed at [facility], [place] [address], [place], [place] [address]    Time spent: 30 min  SIGNED:   Marylu Lund, MD  [facility] 07/05/2021, 3:15 PM  If 7PM-7AM, please contact night-coverage

## 2021-07-10 LAB — MYCOPHENOLIC ACID (CELLCEPT)
MPA Glucuronide: 27 ug/mL (ref 15–125)
MPA: 1.3 ug/mL (ref 1.0–3.5)

## 2021-07-11 MED FILL — Dexamethasone Sodium Phosphate Inj 100 MG/10ML: INTRAMUSCULAR | Qty: 1 | Status: AC

## 2021-07-11 MED FILL — Fosaprepitant Dimeglumine For IV Infusion 150 MG (Base Eq): INTRAVENOUS | Qty: 5 | Status: AC

## 2021-07-12 ENCOUNTER — Inpatient Hospital Stay: Payer: Medicare HMO

## 2021-07-12 ENCOUNTER — Inpatient Hospital Stay (HOSPITAL_BASED_OUTPATIENT_CLINIC_OR_DEPARTMENT_OTHER): Payer: Medicare HMO | Admitting: Hematology and Oncology

## 2021-07-12 ENCOUNTER — Other Ambulatory Visit: Payer: Self-pay | Admitting: *Deleted

## 2021-07-12 ENCOUNTER — Telehealth: Payer: Self-pay | Admitting: *Deleted

## 2021-07-12 ENCOUNTER — Other Ambulatory Visit: Payer: Self-pay

## 2021-07-12 ENCOUNTER — Encounter: Payer: Self-pay | Admitting: *Deleted

## 2021-07-12 ENCOUNTER — Encounter: Payer: Self-pay | Admitting: Hematology and Oncology

## 2021-07-12 VITALS — BP 132/55 | HR 81 | Temp 99.1°F | Resp 16 | Ht 61.0 in | Wt 205.9 lb

## 2021-07-12 DIAGNOSIS — E1140 Type 2 diabetes mellitus with diabetic neuropathy, unspecified: Secondary | ICD-10-CM | POA: Diagnosis not present

## 2021-07-12 DIAGNOSIS — R197 Diarrhea, unspecified: Secondary | ICD-10-CM | POA: Diagnosis not present

## 2021-07-12 DIAGNOSIS — Z1502 Genetic susceptibility to malignant neoplasm of ovary: Secondary | ICD-10-CM | POA: Diagnosis not present

## 2021-07-12 DIAGNOSIS — Z940 Kidney transplant status: Secondary | ICD-10-CM | POA: Diagnosis not present

## 2021-07-12 DIAGNOSIS — Z1509 Genetic susceptibility to other malignant neoplasm: Secondary | ICD-10-CM

## 2021-07-12 DIAGNOSIS — I10 Essential (primary) hypertension: Secondary | ICD-10-CM | POA: Diagnosis not present

## 2021-07-12 DIAGNOSIS — Z1501 Genetic susceptibility to malignant neoplasm of breast: Secondary | ICD-10-CM | POA: Diagnosis not present

## 2021-07-12 DIAGNOSIS — Z95828 Presence of other vascular implants and grafts: Secondary | ICD-10-CM | POA: Insufficient documentation

## 2021-07-12 DIAGNOSIS — Z5189 Encounter for other specified aftercare: Secondary | ICD-10-CM | POA: Diagnosis not present

## 2021-07-12 DIAGNOSIS — R110 Nausea: Secondary | ICD-10-CM | POA: Diagnosis not present

## 2021-07-12 DIAGNOSIS — Z5111 Encounter for antineoplastic chemotherapy: Secondary | ICD-10-CM | POA: Diagnosis present

## 2021-07-12 DIAGNOSIS — Z170 Estrogen receptor positive status [ER+]: Secondary | ICD-10-CM

## 2021-07-12 DIAGNOSIS — Z79899 Other long term (current) drug therapy: Secondary | ICD-10-CM | POA: Diagnosis not present

## 2021-07-12 DIAGNOSIS — C50312 Malignant neoplasm of lower-inner quadrant of left female breast: Secondary | ICD-10-CM

## 2021-07-12 LAB — CMP (CANCER CENTER ONLY)
ALT: 18 U/L (ref 0–44)
AST: 15 U/L (ref 15–41)
Albumin: 4 g/dL (ref 3.5–5.0)
Alkaline Phosphatase: 81 U/L (ref 38–126)
Anion gap: 5 (ref 5–15)
BUN: 32 mg/dL — ABNORMAL HIGH (ref 6–20)
CO2: 24 mmol/L (ref 22–32)
Calcium: 10.3 mg/dL (ref 8.9–10.3)
Chloride: 107 mmol/L (ref 98–111)
Creatinine: 1.57 mg/dL — ABNORMAL HIGH (ref 0.44–1.00)
GFR, Estimated: 39 mL/min — ABNORMAL LOW (ref >60)
Glucose, Bld: 54 mg/dL — ABNORMAL LOW (ref 70–99)
Potassium: 4.9 mmol/L (ref 3.5–5.1)
Sodium: 136 mmol/L (ref 135–145)
Total Bilirubin: 0.3 mg/dL (ref 0.3–1.2)
Total Protein: 6.3 g/dL — ABNORMAL LOW (ref 6.5–8.1)

## 2021-07-12 LAB — CBC WITH DIFFERENTIAL (CANCER CENTER ONLY)
Abs Immature Granulocytes: 0.04 10*3/uL (ref 0.00–0.07)
Basophils Absolute: 0.1 10*3/uL (ref 0.0–0.1)
Basophils Relative: 3 %
Eosinophils Absolute: 0.1 10*3/uL (ref 0.0–0.5)
Eosinophils Relative: 1 %
HCT: 30.1 % — ABNORMAL LOW (ref 36.0–46.0)
Hemoglobin: 9.6 g/dL — ABNORMAL LOW (ref 12.0–15.0)
Immature Granulocytes: 1 %
Lymphocytes Relative: 24 %
Lymphs Abs: 1.4 10*3/uL (ref 0.7–4.0)
MCH: 30.8 pg (ref 26.0–34.0)
MCHC: 31.9 g/dL (ref 30.0–36.0)
MCV: 96.5 fL (ref 80.0–100.0)
Monocytes Absolute: 0.8 10*3/uL (ref 0.1–1.0)
Monocytes Relative: 13 %
Neutro Abs: 3.3 10*3/uL (ref 1.7–7.7)
Neutrophils Relative %: 58 %
Platelet Count: 236 10*3/uL (ref 150–400)
RBC: 3.12 MIL/uL — ABNORMAL LOW (ref 3.87–5.11)
RDW: 15.8 % — ABNORMAL HIGH (ref 11.5–15.5)
WBC Count: 5.6 10*3/uL (ref 4.0–10.5)
nRBC: 0.9 % — ABNORMAL HIGH (ref 0.0–0.2)

## 2021-07-12 MED ORDER — SODIUM CHLORIDE 0.9 % IV SOLN
500.0000 mg/m2 | Freq: Once | INTRAVENOUS | Status: AC
  Administered 2021-07-12: 1000 mg via INTRAVENOUS
  Filled 2021-07-12: qty 50

## 2021-07-12 MED ORDER — SODIUM CHLORIDE 0.9 % IV SOLN
60.0000 mg/m2 | Freq: Once | INTRAVENOUS | Status: AC
  Administered 2021-07-12: 120 mg via INTRAVENOUS
  Filled 2021-07-12: qty 12

## 2021-07-12 MED ORDER — SODIUM CHLORIDE 0.9 % IV SOLN
10.0000 mg | Freq: Once | INTRAVENOUS | Status: AC
  Administered 2021-07-12: 10 mg via INTRAVENOUS
  Filled 2021-07-12: qty 10

## 2021-07-12 MED ORDER — PALONOSETRON HCL INJECTION 0.25 MG/5ML
0.2500 mg | Freq: Once | INTRAVENOUS | Status: AC
  Administered 2021-07-12: 0.25 mg via INTRAVENOUS
  Filled 2021-07-12: qty 5

## 2021-07-12 MED ORDER — SODIUM CHLORIDE 0.9 % IV SOLN
150.0000 mg | Freq: Once | INTRAVENOUS | Status: AC
  Administered 2021-07-12: 150 mg via INTRAVENOUS
  Filled 2021-07-12: qty 150

## 2021-07-12 MED ORDER — SODIUM CHLORIDE 0.9 % IV SOLN: Freq: Once | INTRAVENOUS | Status: AC

## 2021-07-12 MED ORDER — LORAZEPAM 2 MG/ML IJ SOLN
0.5000 mg | Freq: Once | INTRAMUSCULAR | Status: AC
  Administered 2021-07-12: 0.5 mg via INTRAVENOUS
  Filled 2021-07-12: qty 1

## 2021-07-12 MED ORDER — FAMOTIDINE 20 MG IN NS 100 ML IVPB
20.0000 mg | Freq: Once | INTRAVENOUS | Status: AC
  Administered 2021-07-12: 20 mg via INTRAVENOUS
  Filled 2021-07-12: qty 100

## 2021-07-12 NOTE — Telephone Encounter (Signed)
Per MD may proceed with treatment today with noted creatinine of 1.57

## 2021-07-12 NOTE — Progress Notes (Signed)
[place]   Telephone:([phone]) [phone] Fax:(336) [phone]   Clinic Follow up Note   Patient Care Team: Arthur Holms, NP as PCP - General (Nurse Practitioner) Buford Dresser, MD as PCP - Cardiology (Cardiology) Rexene Agent, MD as Consulting Physician (Nephrology) Dwana Melena, MD as Referring Physician (Nephrology) Rockwell Germany, RN as Oncology Nurse Navigator Mauro Kaufmann, RN as Oncology Nurse Navigator Truitt Merle, MD as Consulting Physician (Hematology) Erroll Luna, MD as Consulting Physician (General Surgery) Eppie Gibson, MD as Attending Physician (Radiation Oncology)  Date of Service:  07/12/2021  CHIEF COMPLAINT: f/u of left breast cancer  CURRENT THERAPY:  Adjuvant TC q21 days x4 cycles starting 05/02/21  ASSESSMENT & PLAN:   Cynthia [name] is a 51 y.o. female with   1. 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 Brantley Stage 03/02/2021, surgical path showed invasive and in situ ductal carcinoma, grade 2, node-negative with close but clear margins.  ER/PR positive, HER2 negative, Ki-67 of 20%.  Oncotype showed high risk with recurrence score 26 which predicts 9-year distant recurrence after 5 years of tamoxifen at 16% -She began adjuvant TC on 05/02/21 after delayed wound healing. She tolerated C1 poorly with nausea with vomiting and diarrhea. Following C2, she developed acute cystitis and bacteriemia and was hospitalized 12/4-12/10.  Following cycle 3, she was again admitted with bacteremia and was treated for a urinary tract infection with IV antibiotics.  She is now on Macrobid for prophylaxis.  It is unclear if she were to follow-up with Dr. Morey Hummingbird since the hospital discharge.  I have very clearly explained to her that she might very well have another complicated UTI and very rarely these may end in fatal complications.  She is however willing  to proceed with treatment since she feels well.  Physical examination today without any concerns for active urinary tract infection, no flank tenderness or suprapubic tenderness on exam today. Reviewed with pharmacy, no dosage adjustment needed. She will return to clinic in 3 to 4 weeks for follow-up.  She will proceed with adjuvant radiation after chemotherapy and will proceed with adjuvant antiestrogen therapy following this.  3. Recurrent UTI -recently hospitalized again for a UTI, treated with IV meropenem and now on macrobid -No concerns for an active urinary tract infection at this time. -I agree with Dr. Jacqulyn Cane recommendation to continue Macrobid prophylaxis,  3. Symptom Management: nausea with vomiting, diarrhea -Grade 1 nausea controlled with as needed antinausea medication -Diarrhea, takes Lomotil twice a day.  4. Comorbidities: DM with neuropathy, HTN, s/p kidney transplant -continue medications and f/u, no changes -she has diabetic neuropathy at baseline, on gabapentin -On prednisone and tacrolimus following right kidney transplant in 2017.   5. BRCA2 + pathogenic mutation  -Found on BRCA1/2 analysis and [facility] genetics panel from 02/22/21 -Dr. Jana Hakim has previously recommended role of bilateral mastectomy, salpingo-oophorectomy -She was not agreeable to bilateral mastectomy however she can consider it if she can undergo plastic surgery.  With regards to salpingo-oophorectomy, she is agreeable to seeing a gynecologist.  When she is done with adjuvant chemotherapy and radiation, we can refer her to breast surgery, plastic surgery as well as gynecology. She is still agreeable to recommendations. She should report any symptoms concerning for pancreatic cancer however no family history of pancreatic cancer.  She is seeing dermatology annually.  All of these recommendations were previously discussed as well as today.  No problem-specific Assessment & Plan notes found for this  encounter.   SUMMARY OF ONCOLOGIC HISTORY: Oncology History Overview Note  Cancer Staging Malignant neoplasm of lower-inner quadrant of left breast in female, estrogen receptor positive ([place]) Staging form: Breast, AJCC 8th Edition - Clinical stage from 02/09/2021: Stage IA (cT1c, cN0, cM0, G2, ER+, PR+, HER2-) - Signed by Truitt Merle, MD on 02/20/2021 Stage prefix: Initial diagnosis Histologic grading system: 3 grade system - Pathologic stage from 03/02/2021: Stage IA (pT2, pN0, cM0, G2, ER+, PR+, HER2-, Oncotype DX score: 26) - Signed by Truitt Merle, MD on 03/21/2021 Stage prefix: Initial diagnosis Multigene prognostic tests performed: Oncotype DX Recurrence score range: Greater than or equal to 11 Histologic grading system: 3 grade system Residual tumor (R): R0 - None    Malignant neoplasm of lower-inner quadrant of left breast in female, estrogen receptor positive ([place])  02/07/2021 Mammogram   Exam: Left Diagnostic Mammogram; Left Breast Ultrasound  IMPRESSION: Irregular mass in left breast at 6:30, measuring 1.6 cm by mammogram, is highly suggestive of malignancy. Left axillary ultrasound is negative for lymphadenopathy.   02/09/2021 Cancer Staging   Staging form: Breast, AJCC 8th Edition - Clinical stage from 02/09/2021: Stage IA (cT1c, cN0, cM0, G2, ER+, PR+, HER2-) - Signed by Truitt Merle, MD on 02/20/2021 Stage prefix: Initial diagnosis Histologic grading system: 3 grade system    02/09/2021 Pathology Results   Diagnosis Breast, left, needle core biopsy, 6:30 o'clock 8cm fn - INVASIVE DUCTAL CARCINOMA WITH HISTIOCYTOID FEATURES. SEE NOTE Diagnosis Note Carcinoma measures 1 cm in greatest linear dimension and appears grade 2.  PROGNOSTIC INDICATORS Results: IMMUNOHISTOCHEMICAL AND MORPHOMETRIC ANALYSIS PERFORMED MANUALLY The tumor cells are EQUIVOCAL for Her2 (2+). Her2 by FISH will be performed and results performed separately. Estrogen Receptor: >95%, POSITIVE, STRONG STAINING  INTENSITY Progesterone Receptor: 90%, POSITIVE, STRONG STAINING INTENSITY Proliferation Marker Ki67: 20%  FLUORESCENCE IN-SITU HYBRIDIZATION Results: GROUP 5: HER2 **NEGATIVE** Equivocal form of amplification of the HER2 gene was detected in the IHC 2+ tissue sample received from this individual. HER2 FISH was performed by a technologist and cell imaging and analysis on the [employer]. RATIO OF HER2/CEN17 SIGNALS 1.30 AVERAGE HER2 COPY NUMBER PER CELL 1.75   02/15/2021 Initial Diagnosis   Malignant neoplasm of lower-inner quadrant of left breast in female, estrogen receptor positive ([place])   03/01/2021 Genetic Testing   Positive genetic testing: pathogenic variant detected in BRCA2 at [id number].  No other pathogenic variants detected in [facility] Panel.  The report date is 03/19/2021.  The Custom[employer]-Cancer+RNAinsight panel offered by [facility] includes sequencing and rearrangement analysis for the following 47 genes:  APC, ATM, AXIN2, BARD1, BMPR1A, BRCA1, BRCA2, BRIP1, CDH1, CDK4, CDKN2A, CHEK2, DICER1, EPCAM, GREM1, HOXB13, MEN1, MLH1, MSH2, MSH3, MSH6, MUTYH, NBN, NF1, NF2, NTHL1, PALB2, PMS2, POLD1, POLE, PTEN, RAD51C, RAD51D, RECQL, RET, SDHA, SDHAF2, SDHB, SDHC, SDHD, SMAD4, SMARCA4, STK11, TP53, TSC1, TSC2, and VHL.  RNA data is routinely analyzed for use in variant interpretation for all genes.   03/02/2021 Cancer Staging   Staging form: Breast, AJCC 8th Edition - Pathologic stage from 03/02/2021: Stage IA (pT2, pN0, cM0, G2, ER+, PR+, HER2-, Oncotype DX score: 26) - Signed by Truitt Merle, MD on 03/21/2021 Stage prefix: Initial diagnosis Multigene prognostic tests performed: Oncotype DX Recurrence score range: Greater than or equal to 11 Histologic grading system: 3 grade system Residual tumor (R): R0 - None    05/02/2021 -  Chemotherapy   Patient is on Treatment Plan : BREAST TC q21d  INTERVAL HISTORY:  Cynthia [name] is here for a follow up of breast cancer.    She arrived with a Spanish interpreter today.  Since last visit, she was back in the hospital again with complicated UTI, treated with IV antibiotics and now started on Macrobid for prophylaxis as recommended by Dr. Luis Abed.  She denies any pelvic pain, urinary urgency or flank pain, fevers or chills today.  She is anxious to complete chemotherapy today.  She denies any changes in breathing, bowel habits or urinary habits.  No change in her neuropathy. Rest of the Pertinent 10 Point ROS Reviewed and Negative  MEDICAL HISTORY:  Past Medical History:  Diagnosis Date   Allergy    pollen   Anemia    when on dialysis   Anxiety    BRCA2 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 03/01/2021   Breast cancer [facility])    Cataract    surgical repair bilateral   Depression    ESRD on hemodialysis ([place])    Home HD 5x per week- not on dialysis now had tramsplant 4/17   Family history of ovarian cancer 02/22/2021   GERD (gastroesophageal reflux disease)    Hearing loss 2017   right ear   History of blood transfusion    transfusion reaction   Hyperlipidemia    Hypertension    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 per pt-dr levy also II   Kidney transplant recipient    Neuromuscular disorder ([place])    NEUROPATHY   Sleep apnea     SURGICAL HISTORY: Past Surgical History:  Procedure Laterality Date   ABDOMINAL HYSTERECTOMY     BREAST EXCISIONAL BIOPSY Right 08/2015   BREAST LUMPECTOMY WITH RADIOACTIVE SEED LOCALIZATION Right 09/14/2015   Procedure: RIGHT BREAST LUMPECTOMY WITH RADIOACTIVE SEED LOCALIZATION;  Surgeon: Donnie [place], MD;  Location: [place];  Service: General;  Laterality: Right;   BREAST LUMPECTOMY WITH RADIOACTIVE SEED LOCALIZATION Left 03/02/2021   Procedure: LEFT BREAST SEED LUMPECTOMY LEFT SENTINEL LYMPH NODE [place];  Surgeon: Erroll Luna, MD;  Location: [place];  Service: General;  Laterality:  Left;  GEN AND PEC BLOCK   BREAST SURGERY Bilateral    biopsy bilateral   CATARACT EXTRACTION Bilateral    bilateral   CHOLECYSTECTOMY     CYST REMOVAL NECK     DIALYSIS FISTULA CREATION Left    EYE SURGERY Bilateral    lazer   LEFT HEART CATH AND CORONARY ANGIOGRAPHY N/A 03/04/2019   Procedure: LEFT HEART CATH AND CORONARY ANGIOGRAPHY;  Surgeon: Martinique, Peter M, MD;  Location: [place] [facility];  Service: Cardiovascular;  Laterality: N/A;   LEFT HEART CATHETERIZATION WITH CORONARY ANGIOGRAM N/A 03/30/2014   Procedure: LEFT HEART CATHETERIZATION WITH CORONARY ANGIOGRAM;  Surgeon: Sinclair Grooms, MD;  Location[facility] CATH [facility];  Service: Cardiovascular;  Laterality: N/A;   LIPOMA EXCISION N/A 02/06/2017   Procedure: EXCISION POSTERIOR NECK SEBACEOUS CYST;  Surgeon: Coralie Keens, MD;  Location: [place];  Service: General;  Laterality: N/A;   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   Procedure: REPAIR OF ARTERIOVENOUS FISTULA ANEURYSM;  Surgeon: Serafina Mitchell, MD;  Location: [facility];  Service: Vascular;  Laterality: Left;   REVISON OF ARTERIOVENOUS FISTULA Left 09/28/2013   Procedure: EXCISE ESCHAR LEFT ARM  ARTERIOVENOUS FISTULA WITH PLICATION OF LEFT ARM ARTERIOVENOUS FISTULA;  Surgeon: Elam Dutch, MD;  Location: [place];  Service: Vascular;  Laterality: Left;  REVISON OF ARTERIOVENOUS FISTULA Left 08/26/2015   Procedure: RESECTION ANEURYSM OF LEFT ARM ARTERIOVENOUS FISTULA  ;  Surgeon: Serafina Mitchell, MD;  Location: [place];  Service: Vascular;  Laterality: Left;   TUBAL LIGATION      I have reviewed the social history and family history with the patient and they are unchanged from previous note.  ALLERGIES:  is allergic to other, docetaxel, norvasc [amlodipine], and tape.  MEDICATIONS:  Current Outpatient Medications  Medication Sig Dispense Refill   Accu-Chek Softclix Lancets lancets Use as instructed (Patient taking differently: 1 each by Other route See admin  instructions. Use as instructed) 100 each 12   acetaminophen (TYLENOL) 500 MG tablet Take 500 mg by mouth every 6 (six) hours as needed for mild pain, fever or headache.     aspirin EC 81 MG EC tablet Take 1 tablet (81 mg total) by mouth daily. (Patient taking differently: Take 81 mg by mouth at bedtime.) 90 tablet 3   Blood Glucose Monitoring Suppl (ACCU-CHEK AVIVA CONNECT) w/Device KIT Check sugar 1x daily (Patient taking differently: 1 each by Other route See admin instructions. Check sugar 1x daily) 1 kit 0   carvedilol (COREG) 25 MG tablet Take 12.5 mg by mouth 2 (two) times daily with a meal.      escitalopram (LEXAPRO) 10 MG tablet Take 10 mg by mouth daily.     gabapentin (NEURONTIN) 600 MG tablet Take 600 mg by mouth 2 (two) times daily.     glucose blood (ACCU-CHEK AVIVA PLUS) test strip Check sugar 1x daily (Patient taking differently: 1 each by Other route See admin instructions. Check sugar 1x daily) 100 each 12   insulin glargine (LANTUS SOLOSTAR) 100 UNIT/ML Solostar Pen Inject 25 Units into the skin 2 (two) times daily. (Patient taking differently: Inject 20 Units into the skin 2 (two) times daily.) 75 mL PRN   Insulin Pen Needle (PEN NEEDLES) 30G X 8 MM MISC 1 each by Does not apply route daily. E11.9 90 each 0   MOUNJARO 2.5 MG/0.5ML Pen Inject 2.5 mg into the skin once a week.     mycophenolate (MYFORTIC) 180 MG EC tablet Take 360 mg by mouth 2 (two) times daily.     nitrofurantoin, macrocrystal-monohydrate, (MACROBID) 100 MG capsule Take 1 capsule (100 mg total) by mouth 2 (two) times daily. Start after completing course of bactrim (07/10/21) 60 capsule 0   NOVOLOG FLEXPEN 100 UNIT/ML FlexPen Inject 5 Units into the skin 3 (three) times daily.     ondansetron (ZOFRAN) 8 MG tablet Take 1 tablet (8 mg total) by mouth 2 (two) times daily as needed for refractory nausea / vomiting. Start on day 3 after chemo. 30 tablet 1   pantoprazole (PROTONIX) 40 MG tablet Take 40 mg by mouth daily.   5   predniSONE (DELTASONE) 5 MG tablet Take 5 mg by mouth daily.   2   rosuvastatin (CRESTOR) 5 MG tablet Take 5 mg by mouth at bedtime.     tacrolimus (PROGRAF) 1 MG capsule Take 3 mg by mouth daily.     No current facility-administered medications for this visit.    PHYSICAL EXAMINATION: ECOG PERFORMANCE STATUS: 1 - Symptomatic but completely ambulatory  Vitals:   07/12/21 1108  BP: (!) 132/55  Pulse: 81  Resp: 16  Temp: 99.1 F (37.3 C)  SpO2: 97%   Wt Readings from Last 3 Encounters:  07/12/21 205 lb 14.4 oz (93.4 kg)  07/03/21 203 lb 14.8 oz (  92.5 kg)  06/27/21 197 lb 6 oz (89.5 kg)     GENERAL:alert, no distress and comfortable SKIN: skin color normal, no rashes or significant lesions Chest: Clear to auscultation bilaterally, heart rate and rhythm regular Abdomen: Soft, nontender, nondistended, no hepatosplenomegaly, no flank tenderness or suprapubic tenderness EYES: normal, Conjunctiva are pink and non-injected, sclera clear  NEURO: alert & oriented x 3 with fluent speech  LABORATORY DATA:  I have reviewed the data as listed CBC Latest Ref Rng & Units 07/12/2021 07/05/2021 07/04/2021  WBC 4.0 - 10.5 K/uL 5.6 11.6(H) 13.1(H)  Hemoglobin 12.0 - 15.0 g/dL 9.6(L) 9.6(L) 10.1(L)  Hematocrit 36.0 - 46.0 % 30.1(L) 28.3(L) 30.9(L)  Platelets 150 - 400 K/uL 236 111(L) 131(L)     CMP Latest Ref Rng & Units 07/05/2021 07/04/2021 07/03/2021  Glucose 70 - 99 mg/dL 126(H) 119(H) 184(H)  BUN 6 - 20 mg/dL '17 12 11  ' Creatinine 0.44 - 1.00 mg/dL 0.99 1.08(H) 1.07(H)  Sodium 135 - 145 mmol/L 131(L) 135 128(L)  Potassium 3.5 - 5.1 mmol/L 5.1 4.9 4.9  Chloride 98 - 111 mmol/L 105 107 99  CO2 22 - 32 mmol/L '23 23 23  ' Calcium 8.9 - 10.3 mg/dL 9.0 9.3 9.2  Total Protein 6.5 - 8.1 g/dL - - -  Total Bilirubin 0.3 - 1.2 mg/dL - - -  Alkaline Phos 38 - 126 U/L - - -  AST 15 - 41 U/L - - -  ALT 0 - 44 U/L - - -      RADIOGRAPHIC STUDIES: I have personally reviewed the radiological  images as listed and agreed with the findings in the report. No results found.   All questions were answered. The patient knows to call the clinic with any problems, questions or concerns. No barriers to learning was detected. I spent 30 minutes in the care of this patient including history, review of medical records, counseling and coordination of care.    Benay Pike, MD 07/12/2021

## 2021-07-12 NOTE — Progress Notes (Signed)
[facility]   [phone] Fax:([phone]   Clinic Follow up Note   Patient Care Team: Arthur Holms, NP as PCP - General (Nurse Practitioner) Buford Dresser, MD as PCP - Cardiology (Cardiology) Rexene Agent, MD as Consulting Physician (Nephrology) Dwana Melena, MD as Referring Physician (Nephrology) Rockwell Germany, RN as Oncology Nurse Navigator Mauro Kaufmann, RN as Oncology Nurse Navigator Truitt Merle, MD as Consulting Physician (Hematology) Erroll Luna, MD as Consulting Physician (General Surgery) Eppie Gibson, MD as Attending Physician (Radiation Oncology)  Date of Service:  07/12/2021  CHIEF COMPLAINT: f/u of left breast cancer  CURRENT THERAPY:  Adjuvant TC q21 days x4 cycles starting 05/02/21  ASSESSMENT & PLAN:   Cynthia [name] is a 51 y.o. female with   1. 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 Brantley Stage 03/02/2021, surgical path showed invasive and in situ ductal carcinoma, grade 2, node-negative with close but clear margins.  ER/PR positive, HER2 negative, Ki-67 of 20%.  Oncotype showed high risk with recurrence score 26 which predicts 9-year distant recurrence after 5 years of tamoxifen at 16% -She began adjuvant TC on 05/02/21 after delayed wound healing. She tolerated C1 poorly with nausea with vomiting and diarrhea. Following C2, she developed acute cystitis and bacteriemia and was hospitalized 12/4-12/10. She just completed abx for UTI.  C3 tolerated well so far except for some fatigue and dizziness.  No urinary symptoms concerning for another urinary tract infection.  She will proceed with labs and follow-up prior to cycle 4 of TC.  She will proceed with adjuvant radiation following chemotherapy followed by antiestrogen therapy.  3. Recurrent UTI -recently hospitalized for ESBL E. coli UTI and bacteremia, treated  with IV antibiotics -No concerns for an active urinary tract infection at this time.  3. Symptom Management: nausea with vomiting, diarrhea -Grade 1 nausea controlled with as needed antinausea medication -Diarrhea, takes Lomotil twice a day.  4. Comorbidities: DM with neuropathy, HTN, s/p kidney transplant -continue medications and f/u  -she has diabetic neuropathy at baseline, on gabapentin -On prednisone and tacrolimus following right kidney transplant in 2017.   5. BRCA2 + pathogenic mutation  -Found on BRCA1/2 analysis and [facility] genetics panel from 02/22/21 -Dr. Jana Hakim has previously recommended role of bilateral mastectomy, salpingo-oophorectomy -She was not agreeable to bilateral mastectomy however she can consider it if she can undergo plastic surgery.  With regards to salpingo-oophorectomy, she is agreeable to seeing a gynecologist.  When she is done with adjuvant chemotherapy and radiation, we can refer her to breast surgery, plastic surgery as well as gynecology. She should report any symptoms concerning for pancreatic cancer however no family history of pancreatic cancer.  She is seeing dermatology annually.  All of these recommendations were previously discussed as well as today.   No problem-specific Assessment & Plan notes found for this encounter.   SUMMARY OF ONCOLOGIC HISTORY: Oncology History Overview Note  Cancer Staging Malignant neoplasm of lower-inner quadrant of left breast in female, estrogen receptor positive ([place]) Staging form: Breast, AJCC 8th Edition - Clinical stage from 02/09/2021: Stage IA (cT1c, cN0, cM0, G2, ER+, PR+, HER2-) - Signed by Truitt Merle, MD on 02/20/2021 Stage prefix: Initial diagnosis Histologic grading system: 3 grade system - Pathologic stage from 03/02/2021: Stage IA (pT2, pN0, cM0, G2, ER+, PR+, HER2-, Oncotype DX score: 26) - Signed by Truitt Merle, MD on 03/21/2021 Stage prefix: Initial  diagnosis Multigene prognostic tests performed: Oncotype  DX Recurrence score range: Greater than or equal to 11 Histologic grading system: 3 grade system Residual tumor (R): R0 - None    Malignant neoplasm of lower-inner quadrant of left breast in female, estrogen receptor positive ([place])  02/07/2021 Mammogram   Exam: Left Diagnostic Mammogram; Left Breast Ultrasound  IMPRESSION: Irregular mass in left breast at 6:30, measuring 1.6 cm by mammogram, is highly suggestive of malignancy. Left axillary ultrasound is negative for lymphadenopathy.   02/09/2021 Cancer Staging   Staging form: Breast, AJCC 8th Edition - Clinical stage from 02/09/2021: Stage IA (cT1c, cN0, cM0, G2, ER+, PR+, HER2-) - Signed by Truitt Merle, MD on 02/20/2021 Stage prefix: Initial diagnosis Histologic grading system: 3 grade system    02/09/2021 Pathology Results   Diagnosis Breast, left, needle core biopsy, 6:30 o'clock 8cm fn - INVASIVE DUCTAL CARCINOMA WITH HISTIOCYTOID FEATURES. SEE NOTE Diagnosis Note Carcinoma measures 1 cm in greatest linear dimension and appears grade 2.  PROGNOSTIC INDICATORS Results: IMMUNOHISTOCHEMICAL AND MORPHOMETRIC ANALYSIS PERFORMED MANUALLY The tumor cells are EQUIVOCAL for Her2 (2+). Her2 by FISH will be performed and results performed separately. Estrogen Receptor: >95%, POSITIVE, STRONG STAINING INTENSITY Progesterone Receptor: 90%, POSITIVE, STRONG STAINING INTENSITY Proliferation Marker Ki67: 20%  FLUORESCENCE IN-SITU HYBRIDIZATION Results: GROUP 5: HER2 **NEGATIVE** Equivocal form of amplification of the HER2 gene was detected in the IHC 2+ tissue sample received from this individual. HER2 FISH was performed by a technologist and cell imaging and analysis on the [employer]. RATIO OF HER2/CEN17 SIGNALS 1.30 AVERAGE HER2 COPY NUMBER PER CELL 1.75   02/15/2021 Initial Diagnosis   Malignant neoplasm of lower-inner quadrant of left breast in female, estrogen receptor positive ([place])   03/01/2021 Genetic Testing   Positive genetic  testing: pathogenic variant detected in BRCA2 at [id number].  No other pathogenic variants detected in [facility] Custom[employer] Panel.  The report date is 03/19/2021.  The CustomNext-Cancer+RNAinsight panel offered by [facility] includes sequencing and rearrangement analysis for the following 47 genes:  APC, ATM, AXIN2, BARD1, BMPR1A, BRCA1, BRCA2, BRIP1, CDH1, CDK4, CDKN2A, CHEK2, DICER1, EPCAM, GREM1, HOXB13, MEN1, MLH1, MSH2, MSH3, MSH6, MUTYH, NBN, NF1, NF2, NTHL1, PALB2, PMS2, POLD1, POLE, PTEN, RAD51C, RAD51D, RECQL, RET, SDHA, SDHAF2, SDHB, SDHC, SDHD, SMAD4, SMARCA4, STK11, TP53, TSC1, TSC2, and VHL.  RNA data is routinely analyzed for use in variant interpretation for all genes.   03/02/2021 Cancer Staging   Staging form: Breast, AJCC 8th Edition - Pathologic stage from 03/02/2021: Stage IA (pT2, pN0, cM0, G2, ER+, PR+, HER2-, Oncotype DX score: 26) - Signed by Truitt Merle, MD on 03/21/2021 Stage prefix: Initial diagnosis Multigene prognostic tests performed: Oncotype DX Recurrence score range: Greater than or equal to 11 Histologic grading system: 3 grade system Residual tumor (R): R0 - None    05/02/2021 -  Chemotherapy   Patient is on Treatment Plan : BREAST TC q21d        INTERVAL HISTORY:  Cynthia [name] is here for a follow up of breast cancer.   She arrived with a Spanish interpreter today.  She denies any hospitalization since last visit.  She complains of fatigue and dizziness, no urinary symptoms today.  No change in breathing, bowel habits.  She has about 5 bowel movements a Day and takes 2 Lomotil per Day.  She Complains of rare Nausea and Takes Occasional Antinausea Medication.  No Fevers or Chills.  No Tingling or Numbness or Neuropathy. Rest of the Pertinent 10 Point ROS Reviewed and Negative  MEDICAL HISTORY:  Past Medical History:  Diagnosis Date   Allergy    pollen   Anemia    when on dialysis   Anxiety    BRCA2 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 03/01/2021    Breast cancer [facility])    Cataract    surgical repair bilateral   Depression    ESRD on hemodialysis ([place])    Home HD 5x per week- not on dialysis now had tramsplant 4/17   Family history of ovarian cancer 02/22/2021   GERD (gastroesophageal reflux disease)    Hearing loss 2017   right ear   History of blood transfusion    transfusion reaction   Hyperlipidemia    Hypertension    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 per pt-dr levy also II   Kidney transplant recipient    Neuromuscular disorder ([place])    NEUROPATHY   Sleep apnea     SURGICAL HISTORY: Past Surgical History:  Procedure Laterality Date   ABDOMINAL HYSTERECTOMY     BREAST EXCISIONAL BIOPSY Right 08/2015   BREAST LUMPECTOMY WITH RADIOACTIVE SEED LOCALIZATION Right 09/14/2015   Procedure: RIGHT BREAST LUMPECTOMY WITH RADIOACTIVE SEED LOCALIZATION;  Surgeon: Donnie Mesa, MD;  Location: [place];  Service: General;  Laterality: Right;   BREAST LUMPECTOMY WITH RADIOACTIVE SEED LOCALIZATION Left 03/02/2021   Procedure: LEFT BREAST SEED LUMPECTOMY LEFT SENTINEL LYMPH NODE [place];  Surgeon: Erroll Luna, MD;  Location: [place];  Service: General;  Laterality: Left;  GEN AND PEC BLOCK   BREAST SURGERY Bilateral    biopsy bilateral   CATARACT EXTRACTION Bilateral    bilateral   CHOLECYSTECTOMY     CYST REMOVAL NECK     DIALYSIS FISTULA CREATION Left    EYE SURGERY Bilateral    lazer   LEFT HEART CATH AND CORONARY ANGIOGRAPHY N/A 03/04/2019   Procedure: LEFT HEART CATH AND CORONARY ANGIOGRAPHY;  Surgeon: Martinique, Peter M, MD;  Location: [place] CV LAB;  Service: Cardiovascular;  Laterality: N/A;   LEFT HEART CATHETERIZATION WITH CORONARY ANGIOGRAM N/A 03/30/2014   Procedure: LEFT HEART CATHETERIZATION WITH CORONARY ANGIOGRAM;  Surgeon: Sinclair Grooms, MD;  [facility] C[facility] LAB;  Service: Cardiovascular;  Laterality: N/A;   LIPOMA  EXCISION N/A 02/06/2017   Procedure: EXCISION POSTERIOR NECK SEBACEOUS CYST;  Surgeon: Coralie Keens, MD;  Location: [place];  Service: General;  Laterality: N/A;   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   Procedure: REPAIR OF ARTERIOVENOUS FISTULA ANEURYSM;  Surgeon: Serafina Mitchell, MD;  Location: [facility] OR;  Service: Vascular;  Laterality: Left;   REVISON OF ARTERIOVENOUS FISTULA Left 09/28/2013   Procedure: EXCISE ESCHAR LEFT ARM  ARTERIOVENOUS FISTULA WITH PLICATION OF LEFT ARM ARTERIOVENOUS FISTULA;  Surgeon: Elam Dutch, MD;  Location: [place];  Service: Vascular;  Laterality: Left;   REVISON OF ARTERIOVENOUS FISTULA Left 08/26/2015   Procedure: RESECTION ANEURYSM OF LEFT ARM ARTERIOVENOUS FISTULA  ;  Surgeon: Serafina Mitchell, MD;  Location: [place];  Service: Vascular;  Laterality: Left;   TUBAL LIGATION      I have reviewed the social history and family history with the patient and they are unchanged from previous note.  ALLERGIES:  is allergic to other, docetaxel, norvasc [amlodipine], and tape.  MEDICATIONS:  Current Outpatient Medications  Medication Sig Dispense Refill   Accu-Chek Softclix Lancets lancets Use as instructed (Patient taking differently: 1 each by Other route See  admin instructions. Use as instructed) 100 each 12   acetaminophen (TYLENOL) 500 MG tablet Take 500 mg by mouth every 6 (six) hours as needed for mild pain, fever or headache.     aspirin EC 81 MG EC tablet Take 1 tablet (81 mg total) by mouth daily. (Patient taking differently: Take 81 mg by mouth at bedtime.) 90 tablet 3   Blood Glucose Monitoring Suppl (ACCU-CHEK AVIVA CONNECT) w/Device KIT Check sugar 1x daily (Patient taking differently: 1 each by Other route See admin instructions. Check sugar 1x daily) 1 kit 0   carvedilol (COREG) 25 MG tablet Take 12.5 mg by mouth 2 (two) times daily with a meal.      escitalopram (LEXAPRO) 10 MG tablet Take 10 mg by mouth daily.     gabapentin  (NEURONTIN) 600 MG tablet Take 600 mg by mouth 2 (two) times daily.     glucose blood (ACCU-CHEK AVIVA PLUS) test strip Check sugar 1x daily (Patient taking differently: 1 each by Other route See admin instructions. Check sugar 1x daily) 100 each 12   insulin glargine (LANTUS SOLOSTAR) 100 UNIT/ML Solostar Pen Inject 25 Units into the skin 2 (two) times daily. (Patient taking differently: Inject 20 Units into the skin 2 (two) times daily.) 75 mL PRN   Insulin Pen Needle (PEN NEEDLES) 30G X 8 MM MISC 1 each by Does not apply route daily. E11.9 90 each 0   MOUNJARO 2.5 MG/0.5ML Pen Inject 2.5 mg into the skin once a week.     mycophenolate (MYFORTIC) 180 MG EC tablet Take 360 mg by mouth 2 (two) times daily.     nitrofurantoin, macrocrystal-monohydrate, (MACROBID) 100 MG capsule Take 1 capsule (100 mg total) by mouth 2 (two) times daily. Start after completing course of bactrim (07/10/21) 60 capsule 0   NOVOLOG FLEXPEN 100 UNIT/ML FlexPen Inject 5 Units into the skin 3 (three) times daily.     ondansetron (ZOFRAN) 8 MG tablet Take 1 tablet (8 mg total) by mouth 2 (two) times daily as needed for refractory nausea / vomiting. Start on day 3 after chemo. 30 tablet 1   pantoprazole (PROTONIX) 40 MG tablet Take 40 mg by mouth daily.  5   predniSONE (DELTASONE) 5 MG tablet Take 5 mg by mouth daily.   2   rosuvastatin (CRESTOR) 5 MG tablet Take 5 mg by mouth at bedtime.     tacrolimus (PROGRAF) 1 MG capsule Take 3 mg by mouth daily.     No current facility-administered medications for this visit.    PHYSICAL EXAMINATION: ECOG PERFORMANCE STATUS: 1 - Symptomatic but completely ambulatory  Vitals:   07/12/21 1108  BP: (!) 132/55  Pulse: 81  Resp: 16  Temp: 99.1 F (37.3 C)  SpO2: 97%   Wt Readings from Last 3 Encounters:  07/12/21 205 lb 14.4 oz (93.4 kg)  07/03/21 203 lb 14.8 oz (92.5 kg)  06/27/21 197 lb 6 oz (89.5 kg)     GENERAL:alert, no distress and comfortable SKIN: skin color normal,  no rashes or significant lesions Chest: Clear to auscultation bilaterally, heart rate and rhythm regular Abdomen: Soft, nontender, nondistended, no hepatosplenomegaly Breast: Left breast scar well-healed, left axillary scar well-healed EYES: normal, Conjunctiva are pink and non-injected, sclera clear  NEURO: alert & oriented x 3 with fluent speech  LABORATORY DATA:  I have reviewed the data as listed CBC Latest Ref Rng & Units 07/12/2021 07/05/2021 07/04/2021  WBC 4.0 - 10.5 K/uL 5.6 11.6(H) 13.1(H)  Hemoglobin  12.0 - 15.0 g/dL 9.6(L) 9.6(L) 10.1(L)  Hematocrit 36.0 - 46.0 % 30.1(L) 28.3(L) 30.9(L)  Platelets 150 - 400 K/uL 236 111(L) 131(L)     CMP Latest Ref Rng & Units 07/05/2021 07/04/2021 07/03/2021  Glucose 70 - 99 mg/dL 126(H) 119(H) 184(H)  BUN 6 - 20 mg/dL $Remove'17 12 11  'seXGtBq$ Creatinine 0.44 - 1.00 mg/dL 0.99 1.08(H) 1.07(H)  Sodium 135 - 145 mmol/L 131(L) 135 128(L)  Potassium 3.5 - 5.1 mmol/L 5.1 4.9 4.9  Chloride 98 - 111 mmol/L 105 107 99  CO2 22 - 32 mmol/L $RemoveB'23 23 23  '[id number]$ Calcium 8.9 - 10.3 mg/dL 9.0 9.3 9.2  Total Protein 6.5 - 8.1 g/dL - - -  Total Bilirubin 0.3 - 1.2 mg/dL - - -  Alkaline Phos 38 - 126 U/L - - -  AST 15 - 41 U/L - - -  ALT 0 - 44 U/L - - -      RADIOGRAPHIC STUDIES: I have personally reviewed the radiological images as listed and agreed with the findings in the report. No results found.    No orders of the defined types were placed in this encounter.  All questions were answered. The patient knows to call the clinic with any problems, questions or concerns. No barriers to learning was detected. I spent 30 minutes in the care of this patient including history, review of medical records, counseling and coordination of care.    Benay Pike, MD 07/12/2021

## 2021-07-14 ENCOUNTER — Inpatient Hospital Stay: Payer: Medicare HMO

## 2021-07-14 ENCOUNTER — Other Ambulatory Visit: Payer: Self-pay

## 2021-07-14 VITALS — BP 153/62 | HR 84 | Temp 98.5°F | Resp 20

## 2021-07-14 DIAGNOSIS — Z170 Estrogen receptor positive status [ER+]: Secondary | ICD-10-CM

## 2021-07-14 DIAGNOSIS — C50312 Malignant neoplasm of lower-inner quadrant of left female breast: Secondary | ICD-10-CM

## 2021-07-14 DIAGNOSIS — Z5111 Encounter for antineoplastic chemotherapy: Secondary | ICD-10-CM | POA: Diagnosis not present

## 2021-07-14 MED ORDER — PEGFILGRASTIM-CBQV 6 MG/0.6ML ~~LOC~~ SOSY
6.0000 mg | PREFILLED_SYRINGE | Freq: Once | SUBCUTANEOUS | Status: AC
  Administered 2021-07-14: 6 mg via SUBCUTANEOUS
  Filled 2021-07-14: qty 0.6

## 2021-07-14 NOTE — Patient Instructions (Signed)
Pegfilgrastim Injection ??Qu? es este medicamento? ?El PEGFILGRASTIM es un factor estimulante de colonias de granulocitos de acci?n prolongada que estimula el crecimiento de los neutr?filos, un tipo de gl?bulo blanco importante en la lucha del cuerpo contra la infecci?n. Se utiliza para reducir la incidencia de la fiebre y la infecci?n en pacientes con ciertos tipos de c?ncer que reciben quimioterapia que afecta la m?dula ?sea, y para aumentar la supervivencia despu?s de estar expuesto a altas dosis de radiaci?n. ?Este medicamento puede ser utilizado para otros usos; si tiene alguna pregunta consulte con su proveedor de atenci?n m?dica o con su farmac?utico. ?MARCAS COMUNES: Fulphila, Neulasta, Nyvepria, UDENYCA, Ziextenzo ??Qu? le debo informar a mi profesional de la salud antes de tomar este medicamento? ?Necesitan saber si usted presenta alguno de los siguientes problemas o situaciones: ?enfermedad renal ?alergia al l?tex ?terapia de radiaci?n en curso ?enfermedad de c?lulas falciformes ?reacciones en la piel a los adhesivos acr?licos (inyector en el cuerpo solamente) ?una reacci?n al?rgica o inusual al pegfilgrastim, al filgrastim, a otros medicamentos, alimentos, colorantes o conservantes ?si est? embarazada o buscando quedar embarazada ?si est? amamantando a un beb? ??C?mo debo utilizar este medicamento? ?Este medicamento se administra mediante inyecci?n por v?a subcut?nea. Si recibe este medicamento en su casa, le ense?ar?n c?mo preparar y administrar la jeringa prellenada o c?mo usar el inyector en el cuerpo. Consulte las instrucciones de uso para el paciente para obtener instrucciones detalladas. Use el medicamento exactamente como se le indique. Informe de inmediato a su proveedor de atenci?n de la salud si sospecha que su inyector en el cuerpo puede haber funcionado incorrectamente o si sospecha que el uso de su inyector en el cuerpo hizo que usted recibiera una dosis parcial o no recibiera una dosis. ?Es  importante que deseche las agujas y las jeringas usadas en un recipiente resistente a los pinchazos. No las deseche en la basura. Si no tiene un recipiente resistente a los pinchazos, llame a su farmac?utico o proveedor de atenci?n de la salud para obtenerlo. ?Hable con su pediatra para informarse acerca del uso de este medicamento en ni?os. Aunque este medicamento se puede recetar para ciertas afecciones, existen precauciones que deben tomarse. ?Sobredosis: P?ngase en contacto inmediatamente con un centro toxicol?gico o una sala de urgencia si usted cree que haya tomado demasiado medicamento. ?ATENCI?N: Este medicamento es solo para usted. No comparta este medicamento con nadie. ??Qu? sucede si me olvido de una dosis? ?Es importante no olvidar ninguna dosis. Hable con su m?dico o profesional de la salud si olvida su dosis. Si olvida una dosis debido a una falla o fuga del inyector en el cuerpo, se deber? administrar una nueva dosis tan pronto como sea posible usando una ?nica jeringa prellenada para uso manual. ??Qu? puede interactuar con este medicamento? ?No se han estudiado las interacciones. ?Puede ser que esta lista no menciona todas las posibles interacciones. Informe a su profesional de la salud de todos los productos a base de hierbas, medicamentos de venta libre o suplementos nutritivos que est? tomando. Si usted fuma, consume bebidas alcoh?licas o si utiliza drogas ilegales, ind?queselo tambi?n a su profesional de la salud. Algunas sustancias pueden interactuar con su medicamento. ??A qu? debo estar atento al usar este medicamento? ?Se supervisar? su estado de salud atentamente mientras reciba este medicamento. ?Usted podr?a necesitar realizarse an?lisis de sangre mientras est? usando este medicamento. ?Hable con su proveedor de atenci?n m?dica sobre su riesgo de c?ncer. Usted puede tener mayor riesgo para ciertos tipos de c?ncer si usa este   medicamento. ?Si va a someterse a una IRM (MRI), tomograf?a  computarizada u otro procedimiento, informe a su m?dico que est? usando este medicamento (inyector en el cuerpo solamente). ??Qu? efectos secundarios puedo tener al utilizar este medicamento? ?Efectos secundarios que debe informar a su m?dico o a su profesional de la salud tan pronto como sea posible: ?reacciones al?rgicas (erupci?n cut?nea, comez?n/picaz?n o urticaria, hinchaz?n de la cara, los labios o la lengua) ?dolor de espalda ?mareos ?fiebre ?dolor, enrojecimiento o irritaci?n en el lugar de la inyecci?n ?puntos rojos en la piel ?orina roja o marr?n oscuro ?falta de aire o problemas respiratorios ?dolor de est?mago o del costado, o dolor en el hombro ?hinchaz?n ?cansancio ?dificultad para orinar o cambios en la cantidad de orina ?sangrado o moretones inusuales ?Efectos secundarios que generalmente no requieren atenci?n m?dica (inf?rmelos a su m?dico o a su profesional de la salud si persisten o si son molestos): ?dolor de huesos ?dolor muscular ?Puede ser que esta lista no menciona todos los posibles efectos secundarios. Comun?quese a su m?dico por asesoramiento m?dico sobre los efectos secundarios. Usted puede informar los efectos secundarios a la FDA por tel?fono al 1-800-FDA-1088. ??D?nde debo guardar mi medicina? ?Mantenga fuera del alcance de los ni?os. ?Si est? usando este medicamento en su casa, le indicar?n c?mo guardarlo. Deseche todo el medicamento que no haya utilizado despu?s de la fecha de vencimiento indicada en la etiqueta. ?ATENCI?N: Este folleto es un resumen. Puede ser que no cubra toda la posible informaci?n. Si usted tiene preguntas acerca de esta medicina, consulte con su m?dico, su farmac?utico o su profesional de la salud. ?? 2022 Elsevier/Gold Standard (2021-01-11 00:00:00) ? ?

## 2021-07-18 NOTE — Progress Notes (Signed)
Location of Breast Cancer:  Malignant neoplasm of lower-inner quadrant of left breast in female, estrogen receptor positive  Histology per Pathology Report:  03/02/2021 FINAL MICROSCOPIC DIAGNOSIS:  A. BREAST, LEFT, LUMPECTOMY:  - Invasive and in situ ductal carcinoma, 2.2 cm.  - Invasive carcinoma focally 0.2 cm from anterior margin.  - Biopsy site and biopsy clip.  - See oncology table.  B. BREAST, LEFT SUPERIOR MARGIN, EXCISION:  - Benign breast tissue.  - Final superior margin negative for carcinoma.  C. LYMPH NODE, LEFT AXILLARY, SENTINEL, EXCISION:  - One lymph node negative for metastatic carcinoma (0/1).  D. LYMPH NODE, LEFT AXILLARY, SENTINEL, EXCISION:  - One lymph node negative for metastatic carcinoma (0/1).  E. LYMPH NODE, LEFT AXILLARY, SENTINEL, EXCISION:  - One lymph node negative for metastatic carcinoma (0/1).  F. LYMPH NODE, LEFT AXILLARY, SENTINEL, EXCISION:  - One lymph node negative for metastatic carcinoma (0/1).  G. LYMPH NODE, LEFT AXILLARY, SENTINEL, EXCISION:  - One lymph node negative for metastatic carcinoma (0/1).  H. LYMPH NODE, LEFT AXILLARY, SENTINEL, EXCISION:  - One lymph node negative for metastatic carcinoma (0/1).  Receptor Status: ER(95%), PR (90%), Her2-neu (Negative via FISH), Ki-67(20%)  Did patient present with symptoms (if so, please note symptoms) or was this found on screening mammography?: Found on screening mammogram  Past/Anticipated interventions by surgeon, if any:  03/02/2021 --Dr. Marcello Moores Cornett Left breast seed localized lumpectomy  Left axillary sentinel lymph node mapping utilizing Lymphoseek and mag trace  Past/Anticipated interventions by medical oncology, if any:  Under care of Dr. Arletha Pili Iruku 07/12/2021 She began adjuvant TC on 05/02/21 after delayed wound healing. She tolerated C1 poorly with nausea with vomiting and diarrhea.  Following C2, she developed acute cystitis and bacteriemia and was hospitalized  12/4-12/10. She just completed abx for UTI.  C3 tolerated well so far except for some fatigue and dizziness.  No urinary symptoms concerning for another urinary tract infection.   She will proceed with labs and follow-up prior to cycle 4 of TC.   She will proceed with adjuvant radiation following chemotherapy followed by antiestrogen therapy. BRCA2 + pathogenic mutation  Found on BRCA1/2 analysis and [facility] genetics panel from 02/22/21 Dr. Jana Hakim has previously recommended role of bilateral mastectomy, salpingo-oophorectomy She was not agreeable to bilateral mastectomy however she can consider it if she can undergo plastic surgery.  With regards to salpingo-oophorectomy, she is agreeable to seeing a gynecologist.  When she is done with adjuvant chemotherapy and radiation, we can refer her to breast surgery, plastic surgery as well as gynecology. She should report any symptoms concerning for pancreatic cancer however no family history of pancreatic cancer.  She is seeing dermatology annually.  All of these recommendations were previously discussed as well as today.  Lymphedema issues, if any:  Patient denies    Pain issues, if any:  Reports occasional pain to the breast, but states it will eventually resolve on its own   SAFETY ISSUES: Prior radiation? No Pacemaker/ICD? No Possible current pregnancy? No--hysterectomy Is the patient on methotrexate? No  Current Complaints / other details:  On prednisone and tacrolimus following right kidney transplant in 2017

## 2021-07-18 NOTE — Progress Notes (Signed)
Radiation Oncology         [phone] ________________________________  Name: Cynthia [name] MRN: [id number]  Date: 07/19/2021  DOB: September 19, 1970  Follow-Up Visit Note  Outpatient  CC: Arthur Holms, NP  Truitt Merle, MD  Diagnosis:      ICD-10-CM   1. Malignant neoplasm of lower-inner quadrant of left breast in female, estrogen receptor positive ([place])  C50.312    Z17.0        Cancer Staging  Malignant neoplasm of lower-inner quadrant of left breast in female, estrogen receptor positive ([place]) Staging form: Breast, AJCC 8th Edition - Clinical stage from 02/09/2021: Stage IA (cT1c, cN0, cM0, G2, ER+, PR+, HER2-) - Signed by Truitt Merle, MD on 02/20/2021 Stage prefix: Initial diagnosis Histologic grading system: 3 grade system - Pathologic stage from 03/02/2021: Stage IA (pT2, pN0, cM0, G2, ER+, PR+, HER2-, Oncotype DX score: 26) - Signed by Truitt Merle, MD on 03/21/2021 Stage prefix: Initial diagnosis Multigene prognostic tests performed: Oncotype DX Recurrence score range: Greater than or equal to 11 Histologic grading system: 3 grade system Residual tumor (R): R0 - None    S/p lumpectomy: Stage IA (pT2, pN0, cM0) Left Breast LIQ, Invasive ductal carcinoma and 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     Since breast clinic consultation date of 02/22/21, she underwent genetic testing on 02/22/21 which revealed a pathogenic mutation ([id number]) in the BRCA2 gene. This result is consistent with a diagnosis of hereditary breast and ovarian cancer, and puts the patient at a 45-84% increased lifetime risk of breast cancer, and a 11-18% increased lifetime risk of ovarian cancer.  Given her positive BRCA2 status, Dr. Jana Hakim and Dr. Chryl Heck have both recommended bilateral mastectomies and BSO which the patient is agreeable to  consider. Following adjuvant chemotherapy and radiation, Dr. Chryl Heck planned to refer her to breast surgery, plastic surgery as well as gynecology for further discussion of the risks and benefits.  The patient opted to proceed with left lumpectomy and SLN biopsies on the date of 03/02/21. Pathology from the procedure revealed: tumor size of 2.2 cm; histology of grade 2 invasive ductal carcinoma and intermediate grade ductal carcinoma in-situ; margin status to invasive disease of focally 0.2 cm from the anterior margin, with all margins negative for IDC; margin status to in-situ disease of 0.4 cm from the anterior margin, with all margins negative for in-situ carcinoma; nodal status of 6/6 left axillary sentinel lymph node excisions negative for carcinoma;  ER status: 95% positive; PR status 90% positive (both with strong staining intensity, Her2 status negative; Proliferation marker Ki67 at 20%; Grade 2.  Oncotype DX was obtained on the final surgical sample and the recurrence score of 26 predicts a risk of recurrence outside the breast over the next 9 years of 16%, if the patient's only systemic therapy is an antiestrogen for 5 years.  It also predicts a significant benefit (>15%) from chemotherapy.  Systemic therapy, if applicable, involved (dates and therapy as follows): The patient is currently undergoing chemotherapy consisting of adjuvant TC under the care of Dr. Burr Medico and Dr. Chryl Heck; first dose on 05/02/21. She tolerated C1 poorly with nausea with vomiting and diarrhea. Following C2, she developed acute cystitis and bacteriemia and was hospitalized on 05/28/21 through 06/03/21.  Following cycle 3, she was again admitted with bacteremia and was treated for a urinary tract  infection with IV antibiotics.  She is now on Macrobid for prophylaxis. She has had ongoing problems with recurrent UTI and was recently hospitalized again for a UTI on 07/02/21 - 07/05/21. She was treated with IV meropenem and is now on  Macrobid.  Pertinent imaging performed since the patient was last seen includes:  --CXR performed during hospital admission on 05/28/21 demonstrated no active cardiopulmonary disease.  --Renal US performed while admitted on 05/29/21 showed a small and echogenic appearing right kidney, likely due to her medical renal disease. The left kidney was not well visualized.   Symptomatically, the patient reports: Lymphedema issues, if any:  Patient denies    Pain issues, if any:  Reports occasional pain to the breast, but states it will eventually resolve on its own   SAFETY ISSUES: Prior radiation? No Pacemaker/ICD? No Possible current pregnancy? No--hysterectomy Is the patient on methotrexate? No  Current Complaints / other details:  On prednisone and tacrolimus following right kidney transplant in 2017           ALLERGIES:  is allergic to other, docetaxel, norvasc [amlodipine], and tape.  Meds: Current Outpatient Medications  Medication Sig Dispense Refill   Accu-Chek Softclix Lancets lancets Use as instructed (Patient taking differently: 1 each by Other route See admin instructions. Use as instructed) 100 each 12   acetaminophen (TYLENOL) 500 MG tablet Take 500 mg by mouth every 6 (six) hours as needed for mild pain, fever or headache.     aspirin EC 81 MG EC tablet Take 1 tablet (81 mg total) by mouth daily. (Patient taking differently: Take 81 mg by mouth at bedtime.) 90 tablet 3   Blood Glucose Monitoring Suppl (ACCU-CHEK AVIVA CONNECT) w/Device KIT Check sugar 1x daily (Patient taking differently: 1 each by Other route See admin instructions. Check sugar 1x daily) 1 kit 0   carvedilol (COREG) 25 MG tablet Take 12.5 mg by mouth 2 (two) times daily with a meal.      escitalopram (LEXAPRO) 10 MG tablet Take 10 mg by mouth daily.     gabapentin (NEURONTIN) 600 MG tablet Take 600 mg by mouth 2 (two) times daily.     glucose blood (ACCU-CHEK AVIVA PLUS) test strip Check sugar 1x daily  (Patient taking differently: 1 each by Other route See admin instructions. Check sugar 1x daily) 100 each 12   insulin glargine (LANTUS SOLOSTAR) 100 UNIT/ML Solostar Pen Inject 25 Units into the skin 2 (two) times daily. (Patient taking differently: Inject 20 Units into the skin 2 (two) times daily.) 75 mL PRN   Insulin Pen Needle (PEN NEEDLES) 30G X 8 MM MISC 1 each by Does not apply route daily. E11.9 90 each 0   MOUNJARO 2.5 MG/0.5ML Pen Inject 2.5 mg into the skin once a week.     mycophenolate (MYFORTIC) 180 MG EC tablet Take 360 mg by mouth 2 (two) times daily.     nitrofurantoin, macrocrystal-monohydrate, (MACROBID) 100 MG capsule Take 1 capsule (100 mg total) by mouth 2 (two) times daily. Start after completing course of bactrim (07/10/21) 60 capsule 0   NOVOLOG FLEXPEN 100 UNIT/ML FlexPen Inject 5 Units into the skin 3 (three) times daily.     ondansetron (ZOFRAN) 8 MG tablet Take 1 tablet (8 mg total) by mouth 2 (two) times daily as needed for refractory nausea / vomiting. Start on day 3 after chemo. 30 tablet 1   pantoprazole (PROTONIX) 40 MG tablet Take 40 mg by mouth daily.  5  predniSONE (DELTASONE) 5 MG tablet Take 5 mg by mouth daily.   2   rosuvastatin (CRESTOR) 5 MG tablet Take 5 mg by mouth at bedtime.     tacrolimus (PROGRAF) 1 MG capsule Take 3 mg by mouth daily.     No current facility-administered medications for this encounter.    Physical Findings:  height is '5\' 1"'  (1.549 m) and weight is 198 lb 2 oz (89.9 kg). Her temporal temperature is 96.3 F (35.7 C) (abnormal). Her blood pressure is 108/47 (abnormal) and her pulse is 88. Her respiration is 18 and oxygen saturation is 100%. .     General: Alert and oriented, in no acute distress Sitting in WC HEENT: + alopecia.  Lab Findings: Lab Results  Component Value Date   WBC 5.6 07/12/2021   HGB 9.6 (L) 07/12/2021   HCT 30.1 (L) 07/12/2021   MCV 96.5 07/12/2021   PLT 236 07/12/2021     Radiographic Findings: No  results found.  Impression/Plan:  Ms. Gladwell a lovely 51 yo woman with history of node negative ER+ left breast cancer; she is BRCA 2 positive but initially elected for breast conserving surgery last Sept.  She was scheduled for radiation planning today since she recently completed chemo. We had a long talk today via her translator. She is now intent on getting bilateral mastectomies and BSO.  Given that her breast cancer was node negative, there is no role for radiation if she undergoes mastectomies.  I have sent a message to Dr. Brantley Stage, asking him to see her discuss b/l prophylactic mastectomies as well as a message to Dr. Jeral Pinch given patient's interest in BSO.  She will likely need some more time to recover from chemotherapy but she is interested in setting a plan in place.  I will see her back PRN. CT simulation (RT planning) cancelled.    On date of service, in total, I spent 55 minutes on this encounter. Patient was seen in person.  _____________________________________   Eppie Gibson, MD  This document serves as a record of services personally performed by Eppie Gibson, MD. It was created on her behalf by Roney Mans, a trained medical scribe. The creation of this record is based on the scribe's personal observations and the provider's statements to them. This document has been checked and approved by the attending provider.

## 2021-07-19 ENCOUNTER — Ambulatory Visit
Admission: RE | Admit: 2021-07-19 | Discharge: 2021-07-19 | Disposition: A | Discharge status: Home / Self Care | Payer: Medicare HMO | Source: Ambulatory Visit | Attending: Radiation Oncology | Admitting: Radiation Oncology

## 2021-07-19 ENCOUNTER — Other Ambulatory Visit: Payer: Self-pay

## 2021-07-19 ENCOUNTER — Ambulatory Visit: Payer: Medicare HMO | Admitting: Radiation Oncology

## 2021-07-19 ENCOUNTER — Encounter: Payer: Self-pay | Admitting: Radiation Oncology

## 2021-07-19 VITALS — BP 108/47 | HR 88 | Temp 96.3°F | Resp 18 | Ht 61.0 in | Wt 198.1 lb

## 2021-07-19 DIAGNOSIS — Z7982 Long term (current) use of aspirin: Secondary | ICD-10-CM | POA: Insufficient documentation

## 2021-07-19 DIAGNOSIS — Z170 Estrogen receptor positive status [ER+]: Secondary | ICD-10-CM

## 2021-07-19 DIAGNOSIS — R112 Nausea with vomiting, unspecified: Secondary | ICD-10-CM | POA: Diagnosis not present

## 2021-07-19 DIAGNOSIS — C50312 Malignant neoplasm of lower-inner quadrant of left female breast: Secondary | ICD-10-CM | POA: Insufficient documentation

## 2021-07-19 DIAGNOSIS — Z79899 Other long term (current) drug therapy: Secondary | ICD-10-CM | POA: Insufficient documentation

## 2021-07-19 DIAGNOSIS — R197 Diarrhea, unspecified: Secondary | ICD-10-CM | POA: Diagnosis not present

## 2021-07-19 DIAGNOSIS — Z1509 Genetic susceptibility to other malignant neoplasm: Secondary | ICD-10-CM

## 2021-07-19 DIAGNOSIS — Z1501 Genetic susceptibility to malignant neoplasm of breast: Secondary | ICD-10-CM

## 2021-07-26 ENCOUNTER — Encounter: Payer: Self-pay | Admitting: *Deleted

## 2021-07-26 ENCOUNTER — Telehealth: Payer: Self-pay | Admitting: *Deleted

## 2021-07-26 NOTE — Telephone Encounter (Signed)
Use [employer] to call the patient to schedule a new patient appt with Dr Berline Lopes on 2/24 at 9:45 am. Patient aware of the address and phone number for the clinic.   [place] interpreter ID # [phone]

## 2021-07-28 ENCOUNTER — Ambulatory Visit: Payer: Self-pay | Admitting: Surgery

## 2021-07-28 ENCOUNTER — Telehealth (HOSPITAL_BASED_OUTPATIENT_CLINIC_OR_DEPARTMENT_OTHER): Payer: Self-pay | Admitting: Cardiology

## 2021-07-28 DIAGNOSIS — N185 Chronic kidney disease, stage 5: Secondary | ICD-10-CM

## 2021-07-28 NOTE — Telephone Encounter (Signed)
° °  Pre-operative Risk Assessment    Patient Name: Cynthia [name]  DOB: April 04, 1971 MRN: [id number]      Request for Surgical Clearance    Procedure:   Bilateral Mastectomy  Date of Surgery:  Clearance TBD                                 Surgeon:  Erroll Luna, MD Surgeon's Group or Practice Name:  [facility] Phone number:  [phone]  Fax number:  [phone]   Type of Clearance Requested:   - Medical    Type of Anesthesia:  General    Additional requests/questions:   Please call to advise if pt will require an office visit or further medical work up before clearance can be given. Please call main number and leave a message with the triage nurse. **Carlene Coria, CMA  Signed, Francella Solian   07/28/2021, 4:00 PM

## 2021-07-31 NOTE — Telephone Encounter (Signed)
° °  Called and spoke with patient today for further pre-op evaluation. Patient was last seen by Dr. Harrell Gave in 02/2021 at which time she reported worsening shortness of breath and chest pain at rest that occurs 3-4 times per weeks. She also noted orthopnea and significant weight gain over the last 4 months.  She had had a recent Echo and stress test at Dr. Verdon Cummins office so we were going to try to get those records and then a follow-up was recommended in 2 months. I called and spoke with patient today at which time she reported continued dyspnea both at rest and with exertion and reported that she felt like it was worse since last visit. She also described orthopnea and lower extremity edema but no PND. She reported occasional chest pain (about 1 every 3 months) and stated it occurred at rest and with exertion but was worse with exertion. In addition, she reported intermittent palpitations and dizziness/near syncope. Her surgery has not been scheduled yet. Given multiple symptoms, I think it is best patient comes into the office for evaluation prior to surgery. Patient was agreeable to this. I was able to schedule her a visit with Dr. Harrell Gave on 08/08/2021 at 9:20am. I will route this note to Dr. Harrell Gave so that she is aware.  Of note, telephone interpreter named Joe (ID [phone]) was used.   Darreld Mclean, PA-C 07/31/2021 2:28 PM

## 2021-08-03 ENCOUNTER — Encounter: Payer: Self-pay | Admitting: *Deleted

## 2021-08-05 ENCOUNTER — Other Ambulatory Visit: Payer: Self-pay | Admitting: Hematology

## 2021-08-05 DIAGNOSIS — C50312 Malignant neoplasm of lower-inner quadrant of left female breast: Secondary | ICD-10-CM

## 2021-08-07 ENCOUNTER — Other Ambulatory Visit: Payer: Self-pay

## 2021-08-07 ENCOUNTER — Encounter: Payer: Self-pay | Admitting: Hematology

## 2021-08-07 ENCOUNTER — Inpatient Hospital Stay: Payer: Medicare HMO | Attending: Hematology

## 2021-08-07 ENCOUNTER — Inpatient Hospital Stay (HOSPITAL_BASED_OUTPATIENT_CLINIC_OR_DEPARTMENT_OTHER): Payer: Medicare HMO | Admitting: Hematology

## 2021-08-07 VITALS — BP 131/56 | HR 80 | Temp 98.2°F | Resp 16 | Ht 61.0 in | Wt 207.3 lb

## 2021-08-07 DIAGNOSIS — Z1509 Genetic susceptibility to other malignant neoplasm: Secondary | ICD-10-CM | POA: Diagnosis not present

## 2021-08-07 DIAGNOSIS — R531 Weakness: Secondary | ICD-10-CM | POA: Diagnosis not present

## 2021-08-07 DIAGNOSIS — E1140 Type 2 diabetes mellitus with diabetic neuropathy, unspecified: Secondary | ICD-10-CM | POA: Insufficient documentation

## 2021-08-07 DIAGNOSIS — C50312 Malignant neoplasm of lower-inner quadrant of left female breast: Secondary | ICD-10-CM | POA: Insufficient documentation

## 2021-08-07 DIAGNOSIS — Z170 Estrogen receptor positive status [ER+]: Secondary | ICD-10-CM | POA: Insufficient documentation

## 2021-08-07 DIAGNOSIS — Z940 Kidney transplant status: Secondary | ICD-10-CM | POA: Diagnosis not present

## 2021-08-07 DIAGNOSIS — R52 Pain, unspecified: Secondary | ICD-10-CM | POA: Diagnosis not present

## 2021-08-07 DIAGNOSIS — M2560 Stiffness of unspecified joint, not elsewhere classified: Secondary | ICD-10-CM | POA: Diagnosis not present

## 2021-08-07 DIAGNOSIS — Z79899 Other long term (current) drug therapy: Secondary | ICD-10-CM | POA: Diagnosis not present

## 2021-08-07 DIAGNOSIS — Z1502 Genetic susceptibility to malignant neoplasm of ovary: Secondary | ICD-10-CM | POA: Insufficient documentation

## 2021-08-07 DIAGNOSIS — Z794 Long term (current) use of insulin: Secondary | ICD-10-CM | POA: Insufficient documentation

## 2021-08-07 DIAGNOSIS — E119 Type 2 diabetes mellitus without complications: Secondary | ICD-10-CM | POA: Insufficient documentation

## 2021-08-07 DIAGNOSIS — N3946 Mixed incontinence: Secondary | ICD-10-CM | POA: Insufficient documentation

## 2021-08-07 DIAGNOSIS — Z148 Genetic carrier of other disease: Secondary | ICD-10-CM | POA: Diagnosis not present

## 2021-08-07 DIAGNOSIS — Z20822 Contact with and (suspected) exposure to covid-19: Secondary | ICD-10-CM | POA: Diagnosis not present

## 2021-08-07 DIAGNOSIS — R5383 Other fatigue: Secondary | ICD-10-CM | POA: Insufficient documentation

## 2021-08-07 DIAGNOSIS — Z01812 Encounter for preprocedural laboratory examination: Secondary | ICD-10-CM | POA: Insufficient documentation

## 2021-08-07 LAB — CBC WITH DIFFERENTIAL (CANCER CENTER ONLY)
Abs Immature Granulocytes: 0.06 10*3/uL (ref 0.00–0.07)
Basophils Absolute: 0.1 10*3/uL (ref 0.0–0.1)
Basophils Relative: 1 %
Eosinophils Absolute: 0.3 10*3/uL (ref 0.0–0.5)
Eosinophils Relative: 5 %
HCT: 31.4 % — ABNORMAL LOW (ref 36.0–46.0)
Hemoglobin: 10.2 g/dL — ABNORMAL LOW (ref 12.0–15.0)
Immature Granulocytes: 1 %
Lymphocytes Relative: 24 %
Lymphs Abs: 1.2 10*3/uL (ref 0.7–4.0)
MCH: 31.8 pg (ref 26.0–34.0)
MCHC: 32.5 g/dL (ref 30.0–36.0)
MCV: 97.8 fL (ref 80.0–100.0)
Monocytes Absolute: 0.6 10*3/uL (ref 0.1–1.0)
Monocytes Relative: 13 %
Neutro Abs: 2.7 10*3/uL (ref 1.7–7.7)
Neutrophils Relative %: 56 %
Platelet Count: 137 10*3/uL — ABNORMAL LOW (ref 150–400)
RBC: 3.21 MIL/uL — ABNORMAL LOW (ref 3.87–5.11)
RDW: 15 % (ref 11.5–15.5)
WBC Count: 4.8 10*3/uL (ref 4.0–10.5)
nRBC: 0 % (ref 0.0–0.2)

## 2021-08-07 LAB — CMP (CANCER CENTER ONLY)
ALT: 17 U/L (ref 0–44)
AST: 12 U/L — ABNORMAL LOW (ref 15–41)
Albumin: 3.9 g/dL (ref 3.5–5.0)
Alkaline Phosphatase: 101 U/L (ref 38–126)
Anion gap: 5 (ref 5–15)
BUN: 34 mg/dL — ABNORMAL HIGH (ref 6–20)
CO2: 31 mmol/L (ref 22–32)
Calcium: 9.9 mg/dL (ref 8.9–10.3)
Chloride: 103 mmol/L (ref 98–111)
Creatinine: 1.37 mg/dL — ABNORMAL HIGH (ref 0.44–1.00)
GFR, Estimated: 46 mL/min — ABNORMAL LOW (ref >60)
Glucose, Bld: 171 mg/dL — ABNORMAL HIGH (ref 70–99)
Potassium: 4.4 mmol/L (ref 3.5–5.1)
Sodium: 139 mmol/L (ref 135–145)
Total Bilirubin: 0.3 mg/dL (ref 0.3–1.2)
Total Protein: 5.9 g/dL — ABNORMAL LOW (ref 6.5–8.1)

## 2021-08-07 MED ORDER — EXEMESTANE 25 MG PO TABS: 25.0000 mg | ORAL_TABLET | Freq: Every day | ORAL | 3 refills | Status: DC

## 2021-08-07 MED ORDER — GABAPENTIN 300 MG PO CAPS: 300.0000 mg | ORAL_CAPSULE | Freq: Two times a day (BID) | ORAL | 0 refills | Status: DC

## 2021-08-07 NOTE — Progress Notes (Signed)
[place]   [phone] [phone]   Clinic Follow up Note   Patient Care Team: Arthur Holms, NP as PCP - General (Nurse Practitioner) Buford Dresser, MD as PCP - Cardiology (Cardiology) Rexene Agent, MD as Consulting Physician (Nephrology) Dwana Melena, MD as Referring Physician (Nephrology) Rockwell Germany, RN as Oncology Nurse Navigator Mauro Kaufmann, RN as Oncology Nurse Navigator Truitt Merle, MD as Consulting Physician (Hematology) Erroll Luna, MD as Consulting Physician (General Surgery) Eppie Gibson, MD as Attending Physician (Radiation Oncology)  Date of Service:  08/07/2021  CHIEF COMPLAINT: f/u of left breast cancer, BRCA2+  CURRENT THERAPY:  PENDING Exemestane, to start 08/2021  ASSESSMENT & PLAN:  Cynthia [name] is a 51 y.o. female with   1. 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 Brantley Stage 03/02/21, surgical path showed IDC and DCIS, grade 2, negative nodes with close but clear margins. Ki-67 of 20%.  Oncotype showed high risk. -She began adjuvant TC on 05/02/21 after delayed wound healing. She tolerated C1 poorly with nausea with vomiting and diarrhea. She was able to recover well. She experienced cystitis following C2 and C3. She managed to complete 4 cycles, but she has had a slow recovery. -she is working to undergo b/l mastectomies and BSO; she has met with Dr. Brantley Stage, Dr. Iran Planas, and will meet Dr. Berline Lopes on 2/24. She is also awaiting cardiology clearance. Since she will proceed with mastectomies, she will not need radiation; she has already discussed with Dr. Isidore Moos. --Given the strong ER and PR positivity, I do recommend adjuvant aromatase inhibitor to reduce her risk of cancer recurrence. Her [place] in 04/2021 showed she is postmenopausal. I explained that despite proceeding with mastectomies, this medication  is still recommended as it will reduce her risk of her current breast cancer coming back somewhere else in her body. The potential benefit and side effects, which includes but not limited to, hot flash, skin and vaginal dryness, metabolic changes ( increased blood glucose, cholesterol, weight, etc.), slightly in increased risk of cardiovascular disease, cataracts, muscular and joint discomfort, osteopenia and osteoporosis, etc, were discussed with her in great details. I recommend exemestane, given her existing body/joint pains. She is agreeable; we will plan to start next month.  2. Symptom Management: fatigue, neuropathy, weakness and body pain/sensitivity, altered vision and hearing, LE edema -developed after C4/since completing treatment. -she is on gabapentin for neuropathy in her feet (diabetic), but she now has pain/stiffness and numbness in her hands. I will add lower-dose gabapentin in the morning. I will also refer her to PT, which will also help her weakness. -she reports R>L leg swelling that is painful. I advised her to take tylenol for the pain. She is already on diuretics, started a week ago.  3. Recurrent UTI -hospitalized several times, including following C2 and C3, for ESBL E. coli UTI and bacteremia, treated with IV antibiotics. -we will monitor. She knows to contact us with symptoms.   4. 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   5. BRCA2 + pathogenic mutation  -Found on BRCA1/2 analysis and [facility] genetics panel from 02/22/21 -We previously reviewed the increased risk of recurrent breast and or contralateral breast cancer, ovarian cancer, pancreatic cancer, and melanoma -she previously declined  mastectomies, but she now expresses interest in b/l mastectomy if she can undergo  reconstruction. She has met back with Dr. Brantley Stage. She also met with Dr. Iran Planas, who feels she is at high risk in the setting of uncontrolled DM and immunosuppression. Dr. Iran Planas recommended delayed reconstruction. -she also anticipates proceeding with BSO (s/p partial hysterectomy in 2001); she will meet with Dr. Berline Lopes on 08/18/21. -her 3 daughters have been tested and all are negative.     PLAN: -referral to physical therapy for her deconditioning and peripheral neuropathy  -I called in gabapentin 300 mg for her to take in the morning and afternoon (in addition to her current 600 mg at night) -appointment with Dr. Berline Lopes 2/24 to discuss BSO  -I called in exemestane to start next month -lab, flush, and f/u in 2 months   No problem-specific Assessment & Plan notes found for this encounter.   SUMMARY OF ONCOLOGIC HISTORY: Oncology History Overview Note  Cancer Staging Malignant neoplasm of lower-inner quadrant of left breast in female, estrogen receptor positive ([place]) Staging form: Breast, AJCC 8th Edition - Clinical stage from 02/09/2021: Stage IA (cT1c, cN0, cM0, G2, ER+, PR+, HER2-) - Signed by Truitt Merle, MD on 02/20/2021 Stage prefix: Initial diagnosis Histologic grading system: 3 grade system - Pathologic stage from 03/02/2021: Stage IA (pT2, pN0, cM0, G2, ER+, PR+, HER2-, Oncotype DX score: 26) - Signed by Truitt Merle, MD on 03/21/2021 Stage prefix: Initial diagnosis Multigene prognostic tests performed: Oncotype DX Recurrence score range: Greater than or equal to 11 Histologic grading system: 3 grade system Residual tumor (R): R0 - None    Malignant neoplasm of lower-inner quadrant of left breast in female, estrogen receptor positive ([place])  02/07/2021 Mammogram   Exam: Left Diagnostic Mammogram; Left Breast Ultrasound  IMPRESSION: Irregular mass in left breast at 6:30, measuring 1.6 cm by mammogram, is highly suggestive of malignancy. Left axillary ultrasound is negative for  lymphadenopathy.   02/09/2021 Cancer Staging   Staging form: Breast, AJCC 8th Edition - Clinical stage from 02/09/2021: Stage IA (cT1c, cN0, cM0, G2, ER+, PR+, HER2-) - Signed by Truitt Merle, MD on 02/20/2021 Stage prefix: Initial diagnosis Histologic grading system: 3 grade system    02/09/2021 Pathology Results   Diagnosis Breast, left, needle core biopsy, 6:30 o'clock 8cm fn - INVASIVE DUCTAL CARCINOMA WITH HISTIOCYTOID FEATURES. SEE NOTE Diagnosis Note Carcinoma measures 1 cm in greatest linear dimension and appears grade 2.  PROGNOSTIC INDICATORS Results: IMMUNOHISTOCHEMICAL AND MORPHOMETRIC ANALYSIS PERFORMED MANUALLY The tumor cells are EQUIVOCAL for Her2 (2+). Her2 by FISH will be performed and results performed separately. Estrogen Receptor: >95%, POSITIVE, STRONG STAINING INTENSITY Progesterone Receptor: 90%, POSITIVE, STRONG STAINING INTENSITY Proliferation Marker Ki67: 20%  FLUORESCENCE IN-SITU HYBRIDIZATION Results: GROUP 5: HER2 **NEGATIVE** Equivocal form of amplification of the HER2 gene was detected in the IHC 2+ tissue sample received from this individual. HER2 FISH was performed by a technologist and cell imaging and analysis on the [employer]. RATIO OF HER2/CEN17 SIGNALS 1.30 AVERAGE HER2 COPY NUMBER PER CELL 1.75   02/15/2021 Initial Diagnosis   Malignant neoplasm of lower-inner quadrant of left breast in female, estrogen receptor positive ([place])   03/01/2021 Genetic Testing   Positive genetic testing: pathogenic variant detected in BRCA2 at [id number].  No other pathogenic variants detected in [facility] Panel.  The report date is 03/19/2021.  The CustomNext-Cancer+RNAinsight panel offered by [facility] includes sequencing and rearrangement analysis for the following 47 genes:  APC, ATM, AXIN2, BARD1, BMPR1A, BRCA1, BRCA2, BRIP1,  CDH1, CDK4, CDKN2A, CHEK2, DICER1, EPCAM, GREM1, HOXB13, MEN1, MLH1, MSH2, MSH3, MSH6, MUTYH, NBN, NF1, NF2, NTHL1, PALB2,  PMS2, POLD1, POLE, PTEN, RAD51C, RAD51D, RECQL, RET, SDHA, SDHAF2, SDHB, SDHC, SDHD, SMAD4, SMARCA4, STK11, TP53, TSC1, TSC2, and VHL.  RNA data is routinely analyzed for use in variant interpretation for all genes.   03/02/2021 Cancer Staging   Staging form: Breast, AJCC 8th Edition - Pathologic stage from 03/02/2021: Stage IA (pT2, pN0, cM0, G2, ER+, PR+, HER2-, Oncotype DX score: 26) - Signed by Truitt Merle, MD on 03/21/2021 Stage prefix: Initial diagnosis Multigene prognostic tests performed: Oncotype DX Recurrence score range: Greater than or equal to 11 Histologic grading system: 3 grade system Residual tumor (R): R0 - None    05/02/2021 -  Chemotherapy   Patient is on Treatment Plan : BREAST TC q21d        INTERVAL HISTORY:  Cynthia [name] is here for a follow up of breast cancer and BRCA2+. She was last seen by me on 06/21/21 and by Dr. Chryl Heck in the interim. She presents to the clinic accompanied by her husband. [employer] interpreter used until hospital interpreter arrived. She reports she does not feel she has recovered at all-- she reports fatigue, pain/stiffness and numbness in her hands, weakness in her legs, and altered hearing (left side) and vision. She notes her appetite has improved.   All other systems were reviewed with the patient and are negative.  MEDICAL HISTORY:  Past Medical History:  Diagnosis Date   Allergy    pollen   Anemia    when on dialysis   Anxiety    BRCA2 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 03/01/2021   Breast cancer [facility])    Cataract    surgical repair bilateral   Depression    ESRD on hemodialysis ([place])    Home HD 5x per week- not on dialysis now had tramsplant 4/17   Family history of ovarian cancer 02/22/2021   GERD (gastroesophageal reflux disease)    Hearing loss 2017   right ear   History of blood transfusion    transfusion reaction   Hyperlipidemia    Hypertension    Insulin-dependent diabetes mellitus with renal complications    Type I  beginning now type II per pt-dr levy also II   Kidney transplant recipient    Neuromuscular disorder ([place])    NEUROPATHY   Sleep apnea     SURGICAL HISTORY: Past Surgical History:  Procedure Laterality Date   ABDOMINAL HYSTERECTOMY     BREAST EXCISIONAL BIOPSY Right 08/2015   BREAST LUMPECTOMY WITH RADIOACTIVE SEED LOCALIZATION Right 09/14/2015   Procedure: RIGHT BREAST LUMPECTOMY WITH RADIOACTIVE SEED LOCALIZATION;  Surgeon: Donnie Mesa, MD;  Location: [place];  Service: General;  Laterality: Right;   BREAST LUMPECTOMY WITH RADIOACTIVE SEED LOCALIZATION Left 03/02/2021   Procedure: LEFT BREAST SEED LUMPECTOMY LEFT SENTINEL LYMPH NODE [place];  Surgeon: Erroll Luna, MD;  Location: [place];  Service: General;  Laterality: Left;  GEN AND PEC BLOCK   BREAST SURGERY Bilateral    biopsy bilateral   CATARACT EXTRACTION Bilateral    bilateral   CHOLECYSTECTOMY     CYST REMOVAL NECK     DIALYSIS FISTULA CREATION Left    EYE SURGERY Bilateral    lazer   LEFT HEART CATH AND CORONARY ANGIOGRAPHY N/A 03/04/2019   Procedure: LEFT HEART CATH AND CORONARY ANGIOGRAPHY;  Surgeon: Martinique, Peter M, MD;  Location: [place] CV LAB;  Service: Cardiovascular;  Laterality: N/A;   LEFT HEART CATHETERIZATION WITH CORONARY ANGIOGRAM N/A 03/30/2014   Procedure: LEFT HEART CATHETERIZATION WITH CORONARY ANGIOGRAM;  Surgeon: Sinclair Grooms, MD;  Location[facility] CATH LAB;  Service: Cardiovascular;  Laterality: N/A;   LIPOMA EXCISION N/A 02/06/2017   Procedure: EXCISION POSTERIOR NECK SEBACEOUS CYST;  Surgeon: Coralie Keens, MD;  Location: [place];  Service: General;  Laterality: N/A;   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   Procedure: REPAIR OF ARTERIOVENOUS FISTULA ANEURYSM;  Surgeon: Serafina Mitchell, MD;  Location: [facility];  Service: Vascular;  Laterality: Left;   REVISON OF ARTERIOVENOUS FISTULA Left 09/28/2013   Procedure: EXCISE ESCHAR LEFT ARM   ARTERIOVENOUS FISTULA WITH PLICATION OF LEFT ARM ARTERIOVENOUS FISTULA;  Surgeon: Elam Dutch, MD;  Location: [place];  Service: Vascular;  Laterality: Left;   REVISON OF ARTERIOVENOUS FISTULA Left 08/26/2015   Procedure: RESECTION ANEURYSM OF LEFT ARM ARTERIOVENOUS FISTULA  ;  Surgeon: Serafina Mitchell, MD;  Location: [place];  Service: Vascular;  Laterality: Left;   TUBAL LIGATION      I have reviewed the social history and family history with the patient and they are unchanged from previous note.  ALLERGIES:  is allergic to other, docetaxel, norvasc [amlodipine], and tape.  MEDICATIONS:  Current Outpatient Medications  Medication Sig Dispense Refill   exemestane (AROMASIN) 25 MG tablet Take 1 tablet (25 mg total) by mouth daily after breakfast. 30 tablet 3   gabapentin (NEURONTIN) 300 MG capsule Take 1 capsule (300 mg total) by mouth 2 (two) times daily. Start at 1 cap once daily, and increase to twice daily if she tolerates. Continue 633m at night 60 capsule 0   Accu-Chek Softclix Lancets lancets Use as instructed (Patient taking differently: 1 each by Other route See admin instructions. Use as instructed) 100 each 12   acetaminophen (TYLENOL) 500 MG tablet Take 500 mg by mouth every 6 (six) hours as needed for mild pain, fever or headache.     aspirin EC 81 MG EC tablet Take 1 tablet (81 mg total) by mouth daily. (Patient taking differently: Take 81 mg by mouth at bedtime.) 90 tablet 3   Blood Glucose Monitoring Suppl (ACCU-CHEK AVIVA CONNECT) w/Device KIT Check sugar 1x daily (Patient taking differently: 1 each by Other route See admin instructions. Check sugar 1x daily) 1 kit 0   carvedilol (COREG) 25 MG tablet Take 12.5 mg by mouth 2 (two) times daily with a meal.      escitalopram (LEXAPRO) 10 MG tablet Take 10 mg by mouth daily.     gabapentin (NEURONTIN) 600 MG tablet Take 600 mg by mouth 2 (two) times daily.     glucose blood (ACCU-CHEK AVIVA PLUS) test strip Check sugar 1x daily  (Patient taking differently: 1 each by Other route See admin instructions. Check sugar 1x daily) 100 each 12   insulin glargine (LANTUS SOLOSTAR) 100 UNIT/ML Solostar Pen Inject 25 Units into the skin 2 (two) times daily. (Patient taking differently: Inject 20 Units into the skin 2 (two) times daily.) 75 mL PRN   Insulin Pen Needle (PEN NEEDLES) 30G X 8 MM MISC 1 each by Does not apply route daily. E11.9 90 each 0   MOUNJARO 2.5 MG/0.5ML Pen Inject 2.5 mg into the skin once a week.     mycophenolate (MYFORTIC) 180 MG EC tablet Take 360 mg by mouth 2 (two) times daily.     NOVOLOG FLEXPEN 100 UNIT/ML FlexPen Inject 5 Units into  the skin 3 (three) times daily.     ondansetron (ZOFRAN) 8 MG tablet Take 1 tablet (8 mg total) by mouth 2 (two) times daily as needed for refractory nausea / vomiting. Start on day 3 after chemo. 30 tablet 1   pantoprazole (PROTONIX) 40 MG tablet Take 40 mg by mouth daily.  5   predniSONE (DELTASONE) 5 MG tablet Take 5 mg by mouth daily.   2   rosuvastatin (CRESTOR) 5 MG tablet Take 5 mg by mouth at bedtime.     tacrolimus (PROGRAF) 1 MG capsule Take 3 mg by mouth daily.     No current facility-administered medications for this visit.    PHYSICAL EXAMINATION: ECOG PERFORMANCE STATUS: 2 - Symptomatic, <50% confined to bed  Vitals:   08/07/21 1047  BP: (!) 131/56  Pulse: 80  Resp: 16  Temp: 98.2 F (36.8 C)  SpO2: 98%   Wt Readings from Last 3 Encounters:  08/07/21 207 lb 4.8 oz (94 kg)  07/19/21 198 lb 2 oz (89.9 kg)  07/12/21 205 lb 14.4 oz (93.4 kg)     GENERAL:alert, no distress and comfortable SKIN: skin color normal, no rashes or significant lesions EYES: normal, Conjunctiva are pink and non-injected, sclera clear  NEURO: alert & oriented x 3 with fluent speech  LABORATORY DATA:  I have reviewed the data as listed CBC Latest Ref Rng & Units 08/07/2021 07/12/2021 07/05/2021  WBC 4.0 - 10.5 K/uL 4.8 5.6 11.6(H)  Hemoglobin 12.0 - 15.0 g/dL 10.2(L)  9.6(L) 9.6(L)  Hematocrit 36.0 - 46.0 % 31.4(L) 30.1(L) 28.3(L)  Platelets 150 - 400 K/uL 137(L) 236 111(L)     CMP Latest Ref Rng & Units 08/07/2021 07/12/2021 07/05/2021  Glucose 70 - 99 mg/dL 171(H) 54(L) 126(H)  BUN 6 - 20 mg/dL 34(H) 32(H) 17  Creatinine 0.44 - 1.00 mg/dL 1.37(H) 1.57(H) 0.99  Sodium 135 - 145 mmol/L 139 136 131(L)  Potassium 3.5 - 5.1 mmol/L 4.4 4.9 5.1  Chloride 98 - 111 mmol/L 103 107 105  CO2 22 - 32 mmol/L _0 Calcium 8.9 - 10.3 mg/dL 9.9 10.3 9.0  Total Protein 6.5 - 8.1 g/dL 5.9(L) 6.3(L) -  Total Bilirubin 0.3 - 1.2 mg/dL 0.3 0.3 -  Alkaline Phos 38 - 126 U/L 101 81 -  AST 15 - 41 U/L 12(L) 15 -  ALT 0 - 44 U/L 17 18 -      RADIOGRAPHIC STUDIES: I have personally reviewed the radiological images as listed and agreed with the findings in the report. No results found.    Orders Placed This Encounter  Procedures   Ambulatory referral to Physical Therapy    Referral Priority:   Routine    Referral Type:   Physical Medicine    Referral Reason:   Specialty Services Required    Requested Specialty:   Physical Therapy    Number of Visits Requested:   1   All questions were answered. The patient knows to call the clinic with any problems, questions or concerns. No barriers to learning was detected. The total time spent in the appointment was 30 minutes.     Truitt Merle, MD 08/07/2021   I, Wilburn Mylar, am acting as scribe for Truitt Merle, MD.   I have reviewed the above documentation for accuracy and completeness, and I agree with the above.

## 2021-08-08 ENCOUNTER — Other Ambulatory Visit: Payer: Medicare HMO

## 2021-08-08 ENCOUNTER — Ambulatory Visit (INDEPENDENT_AMBULATORY_CARE_PROVIDER_SITE_OTHER): Payer: Medicare HMO | Admitting: Cardiology

## 2021-08-08 ENCOUNTER — Ambulatory Visit: Payer: Medicare HMO | Admitting: Hematology and Oncology

## 2021-08-08 ENCOUNTER — Encounter (HOSPITAL_BASED_OUTPATIENT_CLINIC_OR_DEPARTMENT_OTHER): Payer: Self-pay | Admitting: Cardiology

## 2021-08-08 VITALS — BP 148/70 | HR 76 | Ht 61.0 in | Wt 208.0 lb

## 2021-08-08 DIAGNOSIS — E7800 Pure hypercholesterolemia, unspecified: Secondary | ICD-10-CM

## 2021-08-08 DIAGNOSIS — Z01818 Encounter for other preprocedural examination: Secondary | ICD-10-CM

## 2021-08-08 DIAGNOSIS — I340 Nonrheumatic mitral (valve) insufficiency: Secondary | ICD-10-CM

## 2021-08-08 DIAGNOSIS — E1029 Type 1 diabetes mellitus with other diabetic kidney complication: Secondary | ICD-10-CM

## 2021-08-08 DIAGNOSIS — I10 Essential (primary) hypertension: Secondary | ICD-10-CM

## 2021-08-08 DIAGNOSIS — Z940 Kidney transplant status: Secondary | ICD-10-CM

## 2021-08-08 LAB — FOLLICLE STIMULATING HORMONE: FSH: 76.9 m[IU]/mL

## 2021-08-08 NOTE — Progress Notes (Signed)
Cardiology Office Note:    Date:  08/08/2021   ID:  Cynthia [name], DOB June 23, 1971, MRN [id number]  PCP:  Arthur Holms, NP  Cardiologist:  Buford Dresser, MD  Referring MD: Arthur Holms, NP   CC: follow-up  History of Present Illness:    Cynthia [name] is a 51 y.o. female with a hx of recently diagnosed left breast cancer, nonobstructive CAD, type 1 diabetes (diagnosed 1993), hypertension, history of ESRD s/p kidney transplant 2017 who is seen for follow-up. She was initially seen as a new consult on 03/01/21 at the request of Arthur Holms, NP for the evaluation and management of mitral regurgitation and edema.  Interpretor present for visit.  Today: She is doing well but is tired because of her chemotherapy. She is accompanied by her sister. She is hoping to undergo a double mastectomy with Dr. Brantley Stage but does not yet have a date. Her nephrologist has already cleared her.  She was told by a different doctor that one of her valves has a leak. We reviewed her prior evaluation for this.  About 3 weeks ago, she had an episode of shortness of breath. Laying down made her feel short of breath. She endorses having chills at the time and coughing up phlegm. However, these symptoms have since resolved.   She endorses mild dizziness which she relates to chemotherapy. She is unable to be active because of the dizziness. Her sister and partner help her with household activities. She can walk up a flight of stairs with no issues.   She also reports occasional bilateral LE swelling which improves in the morning.   Denies chest pain, shortness of breath at rest or with normal exertion. No PND, orthopnea, or unexpected weight gain. No syncope or palpitations.  Past Medical History:  Diagnosis Date   Allergy    pollen   Anemia    when on dialysis   Anxiety    BRCA2 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 03/01/2021   Breast cancer [facility])    Cataract    surgical repair bilateral   Depression     ESRD on hemodialysis ([place])    Home HD 5x per week- not on dialysis now had tramsplant 4/17   Family history of ovarian cancer 02/22/2021   GERD (gastroesophageal reflux disease)    Hearing loss 2017   right ear   History of blood transfusion    transfusion reaction   Hyperlipidemia    Hypertension    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 per pt-dr levy also II   Kidney transplant recipient    Neuromuscular disorder ([place])    NEUROPATHY   Sleep apnea     Past Surgical History:  Procedure Laterality Date   ABDOMINAL HYSTERECTOMY     BREAST EXCISIONAL BIOPSY Right 08/2015   BREAST LUMPECTOMY WITH RADIOACTIVE SEED LOCALIZATION Right 09/14/2015   Procedure: RIGHT BREAST LUMPECTOMY WITH RADIOACTIVE SEED LOCALIZATION;  Surgeon: Donnie Mesa, MD;  Location: [place];  Service: General;  Laterality: Right;   BREAST LUMPECTOMY WITH RADIOACTIVE SEED LOCALIZATION Left 03/02/2021   Procedure: LEFT BREAST SEED LUMPECTOMY LEFT SENTINEL [place];  Surgeon: Erroll Luna, MD;  Location: [place];  Service: General;  Laterality: Left;  GEN AND PEC BLOCK   BREAST SURGERY Bilateral    biopsy bilateral   CATARACT EXTRACTION Bilateral    bilateral   CHOLECYSTECTOMY     CYST REMOVAL NECK  DIALYSIS FISTULA CREATION Left    EYE SURGERY Bilateral    lazer   LEFT HEART CATH AND CORONARY ANGIOGRAPHY N/A 03/04/2019   Procedure: LEFT HEART CATH AND CORONARY ANGIOGRAPHY;  Surgeon: Martinique, Peter M, MD;  Location: [place] [facility];  Service: Cardiovascular;  Laterality: N/A;   LEFT HEART CATHETERIZATION WITH CORONARY ANGIOGRAM N/A 03/30/2014   Procedure: LEFT HEART CATHETERIZATION WITH CORONARY ANGIOGRAM;  Surgeon: Sinclair Grooms, MD;  Location: [facility] CATH LAB;  Service: Cardiovascular;  Laterality: N/A;   LIPOMA EXCISION N/A 02/06/2017   Procedure: EXCISION POSTERIOR NECK SEBACEOUS CYST;  Surgeon: Coralie Keens, MD;  Location: [place];  Service: General;  Laterality: N/A;   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   Procedure: REPAIR OF ARTERIOVENOUS FISTULA ANEURYSM;  Surgeon: Serafina Mitchell, MD;  Location: [facility];  Service: Vascular;  Laterality: Left;   REVISON OF ARTERIOVENOUS FISTULA Left 09/28/2013   Procedure: EXCISE ESCHAR LEFT ARM  ARTERIOVENOUS FISTULA WITH PLICATION OF LEFT ARM ARTERIOVENOUS FISTULA;  Surgeon: Elam Dutch, MD;  Location: [place];  Service: Vascular;  Laterality: Left;   REVISON OF ARTERIOVENOUS FISTULA Left 08/26/2015   Procedure: RESECTION ANEURYSM OF LEFT ARM ARTERIOVENOUS FISTULA  ;  Surgeon: Serafina Mitchell, MD;  Location: [facility] OR;  Service: Vascular;  Laterality: Left;   TUBAL LIGATION      Current Medications: Current Outpatient Medications on File Prior to Visit  Medication Sig   Accu-Chek Softclix Lancets lancets Use as instructed (Patient taking differently: 1 each by Other route See admin instructions. Use as instructed)   acetaminophen (TYLENOL) 500 MG tablet Take 500 mg by mouth every 6 (six) hours as needed for mild pain, fever or headache.   aspirin EC 81 MG EC tablet Take 1 tablet (81 mg total) by mouth daily. (Patient taking differently: Take 81 mg by mouth at bedtime.)   Blood Glucose Monitoring Suppl (ACCU-CHEK AVIVA CONNECT) w/Device KIT Check sugar 1x daily (Patient taking differently: 1 each by Other route See admin instructions. Check sugar 1x daily)   carvedilol (COREG) 25 MG tablet Take 12.5 mg by mouth 2 (two) times daily with a meal.    escitalopram (LEXAPRO) 10 MG tablet Take 10 mg by mouth daily.   exemestane (AROMASIN) 25 MG tablet Take 1 tablet (25 mg total) by mouth daily after breakfast.   gabapentin (NEURONTIN) 300 MG capsule Take 1 capsule (300 mg total) by mouth 2 (two) times daily. Start at 1 cap once daily, and increase to twice daily if she tolerates. Continue 65m at night   glucose blood (ACCU-CHEK AVIVA PLUS)  test strip Check sugar 1x daily (Patient taking differently: 1 each by Other route See admin instructions. Check sugar 1x daily)   insulin glargine (LANTUS SOLOSTAR) 100 UNIT/ML Solostar Pen Inject 25 Units into the skin 2 (two) times daily. (Patient taking differently: Inject 20 Units into the skin 2 (two) times daily.)   Insulin Pen Needle (PEN NEEDLES) 30G X 8 MM MISC 1 each by Does not apply route daily. E11.9   magnesium oxide (MAG-OX) 400 MG tablet Take 400 mg by mouth daily.   MOUNJARO 2.5 MG/0.5ML Pen Inject 2.5 mg into the skin once a week.   mycophenolate (MYFORTIC) 180 MG EC tablet Take 360 mg by mouth 2 (two) times daily.   NOVOLOG FLEXPEN 100 UNIT/ML FlexPen Inject 5 Units into the skin 3 (three) times daily.   ondansetron (ZOFRAN) 8 MG tablet Take 1 tablet (8 mg  total) by mouth 2 (two) times daily as needed for refractory nausea / vomiting. Start on day 3 after chemo.   pantoprazole (PROTONIX) 40 MG tablet Take 40 mg by mouth daily.   predniSONE (DELTASONE) 5 MG tablet Take 5 mg by mouth daily.    rosuvastatin (CRESTOR) 5 MG tablet Take 5 mg by mouth at bedtime.   tacrolimus (PROGRAF) 1 MG capsule Take 3 mg by mouth daily.   cetirizine (ZYRTEC) 10 MG tablet Take 1 tablet by mouth daily.   cyanocobalamin 1000 MCG tablet Take 1 tablet by mouth daily.   No current facility-administered medications on file prior to visit.     Allergies:   Other, Docetaxel, Norvasc [amlodipine], and Tape   Social History   Tobacco Use   Smoking status: Never   Smokeless tobacco: Never  Vaping Use   Vaping Use: Never used  Substance Use Topics   Alcohol use: Yes    Comment: Socially   Drug use: No    Family History: family history includes Asthma in her sister; Cancer (age of onset: 22) in her maternal grandmother; Diabetes in her brother, brother, brother, brother, father, maternal grandfather, mother, paternal grandmother, and sister; Heart disease in her father; Hypertension in her  father; Kidney disease in her father and mother; Lupus in her sister. There is no history of Colon cancer, Colon polyps, Esophageal cancer, Rectal cancer, or Stomach cancer.  ROS:   Please see the history of present illness.  Additional pertinent ROS:   (+) LE edema  EKGs/Labs/Other Studies Reviewed:    The following studies were reviewed today:  Per [facility] records:  Echo 11/28/20--[place] -LVEF normal, mild LVH, EF 60-65% -mild LAE, normal RA -RV normal size/function. RVSP 39 mmHg -mild to moderate MR. Normal appearing valve -trace TR -no effusion or intracardiac mass, normal aorta/arch  Nuclear stress test 11/24/20 ([facility])--[place] -normal ECG at rest and stress -both rest and stress images normal -LVEF 63% with normal wall motion -no evidence of ischemia  Cath 03/04/2019 Prox LAD to Mid LAD lesion is 10% stenosed. Prox Cx to Mid Cx lesion is 20% stenosed. 1st Mrg lesion is 20% stenosed. Prox RCA to Mid RCA lesion is 10% stenosed. The left ventricular systolic function is normal. LV end diastolic pressure is normal. The left ventricular ejection fraction is 55-65% by visual estimate.   1. Minor nonobstructive CAD 2. Normal LV function 3. Normal LVEDP  Echo 01/06/2015 - Left ventricle: The cavity size was normal. Wall thickness was    increased in a pattern of mild LVH. Systolic function was normal.    The estimated ejection fraction was in the range of 55% to 60%.    Wall motion was normal; there were no regional wall motion    abnormalities.  - Mitral valve: Calcified annulus. There was mild regurgitation.  - Atrial septum: No defect or patent foramen ovale was identified.   EKG:  EKG is personally reviewed.   08/08/21: NSR at 76 bpm 03/01/21 NSR, LAFB, t wave inversion I, aVL  Recent Labs: 07/05/2021: Magnesium 1.9 08/07/2021: ALT 17; BUN 34; Creatinine 1.37; Hemoglobin 10.2; Platelet Count 137; Potassium 4.4; Sodium 139  Recent Lipid Panel    Component  Value Date/Time   CHOL 162 03/29/2014 0616   TRIG 284 (H) 03/29/2014 0616   HDL 31 (L) 03/29/2014 0616   CHOLHDL 5.2 03/29/2014 0616   VLDL 57 (H) 03/29/2014 0616   LDLCALC 74 03/29/2014 0616    Physical Exam:  VS:  BP (!) 148/70    Pulse 76    Ht '5\' 1"'  (1.549 m)    Wt 208 lb (94.3 kg)    SpO2 98%    BMI 39.30 kg/m     Wt Readings from Last 3 Encounters:  08/08/21 208 lb (94.3 kg)  08/07/21 207 lb 4.8 oz (94 kg)  07/19/21 198 lb 2 oz (89.9 kg)    GEN: Well nourished, well developed in no acute distress HEENT: Normal, moist mucous membranes NECK: No JVD CARDIAC: regular rhythm, normal S1 and S2, no rubs or gallops. There is a prominent murmur from her fistula, and I do not appreciate a separate MR murmur VASCULAR: Radial and DP pulses 2+ bilaterally. No carotid bruits RESPIRATORY:  Clear to auscultation without rales, wheezing or rhonchi  ABDOMEN: Soft, non-tender, non-distended MUSCULOSKELETAL:  Ambulates independently SKIN: Warm and dry, trace bilateral edema NEUROLOGIC:  Alert and oriented x 3. No focal neuro deficits noted. PSYCHIATRIC:  Normal affect    ASSESSMENT:    1. Pre-op evaluation   2. Mitral valve insufficiency, unspecified etiology   3. Type 1 diabetes mellitus with other kidney complication (HCC)   4. Kidney transplant status   5. Hypercholesterolemia   6. Primary hypertension     PLAN:    Preoperative cardiovascular evaluation Based on available date, patient's RCRI score = 1 (insulin), which carries a 6% 30-day risk of death, MI, or cardiac arrest.  The patient is not currently having active cardiac symptoms, and they can achieve >4 METs of activity.  According to ACC/AHA Guidelines, no further testing is needed.  Proceed with surgery at acceptable risk.  Our service is available as needed in the peri-operative period.  Aspirin can be held peri-operatively if needed, restart when reasonable from surgical perspective.  mitral regurgitation -had  echo, stress test done with Dr. Claudie Leach in [facility]. Records above. Noted mild-moderate MR -has loud bruit/murmur from fistula, but did not appreciate separate MR murmur today -she was told MR was not severe. Will need to confirm on echo. -per notes, on furosemide 40 mg BID with resolution in LE edema but continued shortness of breath  Type I diabetes, with kidney complications, now s/p kidney transplant -follows with Dr. Joelyn Oms at [place] transplant nephrology  Hyperlipidemia with diabetes Aortic atherosclerosis noted on CT 06/01/2019 -on aspirin 81 mg, rosuvastatin -she is on mounjaro for diabetes, with benefit of potential weight loss  Hypertension: not at goal of <130/80 today, but feeling poorly -continue carvedilol, furosemide  Cardiac risk counseling and prevention recommendations: -recommend heart healthy/Mediterranean diet, with whole grains, fruits, vegetable, fish, lean meats, nuts, and olive oil. Limit salt. -recommend moderate walking, 3-5 times/week for 30-50 minutes each session. Aim for at least 150 minutes.week. Goal should be pace of 3 miles/hours, or walking 1.5 miles in 30 minutes -recommend avoidance of tobacco products. Avoid excess alcohol. -ASCVD risk score: The 10-year ASCVD risk score (Arnett DK, et al., 2019) is: 5.3%   Values used to calculate the score:     Age: 60 years     Sex: Female     Is Non-Hispanic African American: No     Diabetic: Yes     Tobacco smoker: No     Systolic Blood Pressure: 466 mmHg     Is BP treated: Yes     HDL Cholesterol: 67 mg/dL     Total Cholesterol: 248 mg/dL    Plan for follow up: 6 months  Buford Dresser, MD,  PhD, [facility]    Medication Adjustments/Labs and Tests Ordered: Current medicines are reviewed at length with the patient today.  Concerns regarding medicines are outlined above.  Orders Placed This Encounter  Procedures   EKG 12-Lead   No orders of the  defined types were placed in this encounter.  Patient Instructions  Medication Instructions:  Your Physician recommend you continue on your current medication as directed.    *If you need a refill on your cardiac medications before your next appointment, please call your pharmacy*   Lab Work: None ordered today   Testing/Procedures: None ordered today   Follow-Up: At [facility], you and your health needs are our priority.  As part of our continuing mission to provide you with exceptional heart care, we have created designated Provider Care Teams.  These Care Teams include your primary Cardiologist (physician) and Advanced Practice Providers (APPs -  Physician Assistants and Nurse Practitioners) who all work together to provide you with the care you need, when you need it.  We recommend signing up for the patient portal called "MyChart".  Sign up information is provided on this After Visit Summary.  MyChart is used to connect with patients for Virtual Visits (Telemedicine).  Patients are able to view lab/test results, encounter notes, upcoming appointments, etc.  Non-urgent messages can be sent to your provider as well.   To learn more about what you can do with MyChart, go to [email or web address].    Your next appointment:   6 month(s)  The format for your next appointment:   In Person  Provider:   Buford Dresser, MD        Wilhemina Bonito as a scribe for Buford Dresser, MD.,have documented all relevant documentation on the behalf of Buford Dresser, MD,as directed by  Buford Dresser, MD while in the presence of Buford Dresser, MD.  I, Buford Dresser, MD, have reviewed all documentation for this visit. The documentation on 08/11/21 for the exam, diagnosis, procedures, and orders are all accurate and complete.   Signed, Buford Dresser, MD PhD 08/08/2021    [place]

## 2021-08-08 NOTE — Patient Instructions (Signed)

## 2021-08-10 ENCOUNTER — Encounter: Payer: Self-pay | Admitting: *Deleted

## 2021-08-11 ENCOUNTER — Encounter (HOSPITAL_BASED_OUTPATIENT_CLINIC_OR_DEPARTMENT_OTHER): Payer: Self-pay | Admitting: Cardiology

## 2021-08-14 ENCOUNTER — Other Ambulatory Visit: Payer: Self-pay

## 2021-08-14 ENCOUNTER — Ambulatory Visit: Payer: Medicare HMO | Attending: Hematology | Admitting: Rehabilitation

## 2021-08-14 ENCOUNTER — Encounter: Payer: Self-pay | Admitting: *Deleted

## 2021-08-14 ENCOUNTER — Encounter: Payer: Self-pay | Admitting: Rehabilitation

## 2021-08-14 DIAGNOSIS — C50312 Malignant neoplasm of lower-inner quadrant of left female breast: Secondary | ICD-10-CM | POA: Insufficient documentation

## 2021-08-14 DIAGNOSIS — M6281 Muscle weakness (generalized): Secondary | ICD-10-CM | POA: Insufficient documentation

## 2021-08-14 DIAGNOSIS — Z170 Estrogen receptor positive status [ER+]: Secondary | ICD-10-CM | POA: Diagnosis not present

## 2021-08-14 DIAGNOSIS — Z483 Aftercare following surgery for neoplasm: Secondary | ICD-10-CM

## 2021-08-14 DIAGNOSIS — R293 Abnormal posture: Secondary | ICD-10-CM | POA: Insufficient documentation

## 2021-08-14 NOTE — Therapy (Signed)
OUTPATIENT PHYSICAL THERAPY ONCOLOGY EVALUATION  Patient Name: Cynthia [name] MRN: [id number] DOB:05-16-1971, 51 y.o., female Today's Date: 08/14/2021   PT End of Session - 08/14/21 1010     Visit Number 3    Number of Visits 19    Date for PT Re-Evaluation 10/09/21    Authorization Type humana - update auth as approved    PT Start Time 1015    Activity Tolerance Patient tolerated treatment well    Behavior During Therapy [facility] for tasks assessed/performed             Past Medical History:  Diagnosis Date   Allergy    pollen   Anemia    when on dialysis   Anxiety    BRCA2 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 03/01/2021   Breast cancer [facility]    Cataract    surgical repair bilateral   Depression    ESRD on hemodialysis ([place])    Home HD 5x per week- not on dialysis now had tramsplant 4/17   Family history of ovarian cancer 02/22/2021   GERD (gastroesophageal reflux disease)    Hearing loss 2017   right ear   History of blood transfusion    transfusion reaction   Hyperlipidemia    Hypertension    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 per pt-dr levy also II   Kidney transplant recipient    Neuromuscular disorder ([place])    NEUROPATHY   Sleep apnea    Past Surgical History:  Procedure Laterality Date   ABDOMINAL HYSTERECTOMY     BREAST EXCISIONAL BIOPSY Right 08/2015   BREAST LUMPECTOMY WITH RADIOACTIVE SEED LOCALIZATION Right 09/14/2015   Procedure: RIGHT BREAST LUMPECTOMY WITH RADIOACTIVE SEED LOCALIZATION;  Surgeon: Donnie Mesa, MD;  Location: [place];  Service: General;  Laterality: Right;   BREAST LUMPECTOMY WITH RADIOACTIVE SEED LOCALIZATION Left 03/02/2021   Procedure: LEFT BREAST SEED LUMPECTOMY LEFT SENTINEL LYMPH NODE [place];  Surgeon: Erroll Luna, MD;  Location: [place];  Service: General;  Laterality: Left;  GEN AND PEC BLOCK   BREAST SURGERY Bilateral    biopsy  bilateral   CATARACT EXTRACTION Bilateral    bilateral   CHOLECYSTECTOMY     CYST REMOVAL NECK     DIALYSIS FISTULA CREATION Left    EYE SURGERY Bilateral    lazer   LEFT HEART CATH AND CORONARY ANGIOGRAPHY N/A 03/04/2019   Procedure: LEFT HEART CATH AND CORONARY ANGIOGRAPHY;  Surgeon: Martinique, Peter M, MD;  Location: [place] CV LAB;  Service: Cardiovascular;  Laterality: N/A;   LEFT HEART CATHETERIZATION WITH CORONARY ANGIOGRAM N/A 03/30/2014   Procedure: LEFT HEART CATHETERIZATION WITH CORONARY ANGIOGRAM;  Surgeon: Sinclair Grooms, MD;  Location: [facility] CATH LAB;  Service: Cardiovascular;  Laterality: N/A;   LIPOMA EXCISION N/A 02/06/2017   Procedure: EXCISION POSTERIOR NECK SEBACEOUS CYST;  Surgeon: Coralie Keens, MD;  Location: [place];  Service: General;  Laterality: N/A;   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   Procedure: REPAIR OF ARTERIOVENOUS FISTULA ANEURYSM;  Surgeon: Serafina Mitchell, MD;  Location: [facility] OR;  Service: Vascular;  Laterality: Left;   REVISON OF ARTERIOVENOUS FISTULA Left 09/28/2013   Procedure: EXCISE ESCHAR LEFT ARM  ARTERIOVENOUS FISTULA WITH PLICATION OF LEFT ARM ARTERIOVENOUS FISTULA;  Surgeon: Elam Dutch, MD;  Location: [place];  Service: Vascular;  Laterality: Left;   REVISON OF ARTERIOVENOUS FISTULA Left 08/26/2015   Procedure: RESECTION ANEURYSM  OF LEFT ARM ARTERIOVENOUS FISTULA  ;  Surgeon: Serafina Mitchell, MD;  Location: [facility] OR;  Service: Vascular;  Laterality: Left;   TUBAL LIGATION     Patient Active Problem List   Diagnosis Date Noted   Port-A-Cath in place 07/12/2021   Hyponatremia 07/03/2021   UTI (urinary tract infection) 07/02/2021   Leukocytosis 18/84/1660   Complicated UTI (urinary tract infection) 06/13/2021   PICC (peripherally inserted central catheter) in place 06/13/2021   Acute UTI 05/28/2021   Stage 3a chronic kidney disease (CKD) ([place]) 05/28/2021   Thrombocytopenia ([place]) 05/28/2021   Aortic atherosclerosis ([place])  03/07/2021   Hypercholesterolemia 03/07/2021   BRCA2 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 03/01/2021   Genetic testing 03/01/2021   Family history of ovarian cancer 02/22/2021   Malignant neoplasm of lower-inner quadrant of left breast in female, estrogen receptor positive ([place]) 02/15/2021   Foot pain, bilateral 07/28/2020   COVID-19 virus infection 06/16/2019   Insulin dependent type 2 diabetes mellitus ([place]) 06/06/2019   Hyperglycemia 06/02/2019   DKA (diabetic ketoacidoses) 06/02/2019   HTN (hypertension) 03/04/2019   Abnormal nuclear stress test 03/04/2019   Diabetic nephropathy ([place]) 03/03/2019   History of renal transplant 11/18/2015   Diabetic retinopathy associated with type 2 diabetes mellitus ([place]) 04/13/2015   Postprandial vomiting    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   Anemia 01/04/2015   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   Hyperkalemia 11/16/2013   Chronic constipation 07/28/2013   Paresthesias 07/28/2013   Diabetes mellitus type I [facility])    Kidney transplant status 05/28/2013   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/11/2013   Precordial chest pain 02/10/2013   Awaiting organ transplant 09/19/2012    PCP: Arthur Holms, NP  REFERRING PROVIDER: Truitt Merle, MD  REFERRING DIAG: neuropathy and breast   THERAPY DIAG:  Aftercare following surgery for neoplasm  Malignant neoplasm of lower-inner quadrant of left breast in female, estrogen receptor positive ([place])  Abnormal posture  Muscle weakness (generalized)  ONSET DATE: 03/02/21  SUBJECTIVE                                                                                                                                                                                           SUBJECTIVE STATEMENT: I am not sure why I stopped the SOZO scans. My arm does not feel swollen. My fingers get stuck together. That is since chemotherapy.  I am not having the numbness in the feet much anymore.  I am having weakness. My  balance is very bad   PERTINENT HISTORY:  Left breast cancer s/p left lumpectomy 03/02/21 due to grade 2 IDC with negative LN.  Due to  high risk oncotype pt started adjuvant TC after delayed wound healing. Pending bil mastectomy with Dr. Brantley Stage. On gabapentin for diabetic neuropathy and long term steroids for kidney transplant. Other history includes: CAD, type 1 DM, HTN, s/p kidney transplant 2017.   PAIN:  Are you having pain? No but my shoulder hurts. More like tight muscles   PRECAUTIONS: Other: lymphedema risk Lt UE  WEIGHT BEARING RESTRICTIONS No  FALLS:  Has patient fallen in last 6 months? No  LIVING ENVIRONMENT: Lives with: lives with their family and lives with their spouse Lives in: House/apartment Stairs: Yes; 12 stairs - this is hard - weakness and SOB  Has following equipment at home: None  OCCUPATION: no  LEISURE: no  HAND DOMINANCE : right   PRIOR LEVEL OF FUNCTION: Independent with basic ADLs Sweeping and shopping - I always use the cart.  How far could you walk currently: 40 minutes   PATIENT GOALS Have enough strength in my legs   OBJECTIVE  COGNITION:  Overall cognitive status: Within functional limits for tasks assessed    POSTURE: WNL   UPPER EXTREMITY STRENGTH:   Rt   Lt Flexion   4-   4- Abduction  4   4 Elbow flexion  4   4  Elbow extension 4+   4+  Wrist extension 4   4     LE MMT:  Hip Flexion:   4-   4- Abduction  4-   4 ER   4   4 Knee extension 4   4 Flexion  4-   4- Ankle DF  4-   4    Test Score Norms  TUG 56f 10 seconds High Fall risk >/= 14 sec Any fall risk: 12+ sec Risk of developing disability: 9+  Gait Speed (distance (m)/time)  <0.425m household only 0.4-0.8 limited community 0.8-1.2 community 1.2 able to cross streets  Needs intervention for fall risk: <12m42m 5 time sit to stand (16) 16 seconds  >13.6 sec= increased disability >15sec predicts recurrent falls   30 second sit to stand (17)    <8 lower  functional ability See below: minimum to maintain independent mobility   Grip Strength  Rt:              35                        Lt: 30  Normal 55lbs for age  73 min walk  Not if: resting HR>120, >180/100m73m  Cardiac risk factors: peak HR<120-130BPM or rise of <20BPM  Beta blocker: HR not reliable use RPE and BP  Baseline HR:           BP:           O2:   Distance:  Post HR:           BP:           RPE:           SOB:   1312-2300ft6fmal 175ft 47f <1085ft 216fre likely to fall   Berg 38/56   <45 high risk of falls <40 almost 100% fall risk     PATIENT EDUCATION:  Education details: POC Person educated: Patient Education method: Explanation Education comprehension: verbalized understanding    ASSESSMENT:  CLINICAL IMPRESSION: Patient is a 50 y.o.32emale who was seen today for physical therapy evaluation and treatment for her weakness and poor balance post chemotherapy. Per above pt  demonstrates high fall risk from the results of her BERG balance score and sit to stand.  She has global mild weakness in the UE and LE Rt LE>Lt.  Pt is awaiting scheduling for a bil mastectomy so we discussed how we will most likely transition into UE care and then return to balance.  Pt was doing SOZO scans but has not done one since December so we will return to those as well.     OBJECTIVE IMPAIRMENTS decreased activity tolerance, difficulty walking, and decreased strength.   ACTIVITY LIMITATIONS cleaning and shopping.   PERSONAL FACTORS Age, Fitness, and 3+ comorbidities: SLNB, chemotherapy, DM  are also affecting patient's functional outcome.    REHAB POTENTIAL: Good  CLINICAL DECISION MAKING: Stable/uncomplicated  EVALUATION COMPLEXITY: Low   GOALS: Goals reviewed with patient?Yes  LONG TERM GOALS:   LTG Name Target Date Goal status  1 Pt will improve Berg balance score to >45 to demonstrate lower fall risk Baseline: 38 10/09/2021 INITIAL  2 Pt will  improve sit to stand x 5 in less than 15 seconds to lower fall risk Baseline: 16 seconds 10/09/2021 INITIAL  3 Pt will report improved ability to walk up the stairs at home Baseline: has to stop half way 10/09/2021 INITIAL  4 Pt will be ind with final HEP Baseline: 10/09/2021 INITIAL                  PLAN: PT FREQUENCY: 2x/week  PT DURATION: 8 weeks  PLANNED INTERVENTIONS: Therapeutic exercises, Therapeutic activity, Neuro Muscular re-education, Balance training, Gait training, Patient/Family education, and Joint mobilization  PLAN FOR NEXT SESSION: do SOZO and restart screenings being aware of bil mastectomy coming up,  start balance and general LE strength activities   Stark Bray, PT 08/14/2021, 10:11 AM

## 2021-08-15 ENCOUNTER — Encounter: Payer: Self-pay | Admitting: *Deleted

## 2021-08-17 ENCOUNTER — Encounter: Payer: Self-pay | Admitting: Gynecologic Oncology

## 2021-08-17 NOTE — Progress Notes (Addendum)
Surgical Instructions    Your procedure is scheduled on Tuesday February 28.  Report to [facility] Main Entrance "A" at 10:30 A.M., then check in with the Admitting office.  Call this number if you have problems the morning of surgery:  [phone]   If you have any questions prior to your surgery date call [phone]: Open Monday-Friday 8am-4pm    Remember:  Do not eat after midnight the night before your surgery  You may drink clear liquids until 9:30am the morning of your surgery.   Clear liquids allowed are: Water, Non-Citrus Juices (without pulp), Carbonated Beverages, Clear Tea, Black Coffee ONLY (NO MILK, CREAM OR POWDERED CREAMER of any kind), and Gatorade    Take these medicines the morning of surgery with A SIP OF WATER:  carvedilol (COREG)  cetirizine (ZYRTEC)  escitalopram (LEXAPRO)  gabapentin (NEURONTIN) mycophenolate (MYFORTIC) pantoprazole (PROTONIX) predniSONE (DELTASONE) tacrolimus (PROGRAF)  If needed, you may take:  acetaminophen (TYLENOL) ondansetron (ZOFRAN)    WHAT DO I DO ABOUT MY DIABETES MEDICATION?  THE NIGHT BEFORE SURGERY, take 16 units of _insulin glargine (LANTUS SOLOSTAR) insulin.    THE MORNING OF SURGERY, take 16 units of insulin glargine (LANTUS SOLOSTAR) insulin.  If your CBG is greater than 220 mg/dL, you may take  of your NOVOLOG FLEXPEN (correction) dose of insulin the morning of surgery.   HOW TO MANAGE YOUR DIABETES BEFORE AND AFTER SURGERY  Why is it important to control my blood sugar before and after surgery? Improving blood sugar levels before and after surgery helps healing and can limit problems. A way of improving blood sugar control is eating a healthy diet by:  Eating less sugar and carbohydrates  Increasing activity/exercise  Talking with your doctor about reaching your blood sugar goals High blood sugars (greater than 180 mg/dL) can raise your risk of infections and slow your recovery, so you will need to focus  on controlling your diabetes during the weeks before surgery. Make sure that the doctor who takes care of your diabetes knows about your planned surgery including the date and location.  How do I manage my blood sugar before surgery? Check your blood sugar at least 4 times a day, starting 2 days before surgery, to make sure that the level is not too high or low.  Check your blood sugar the morning of your surgery when you wake up and every 2 hours until you get to the Short Stay unit.  If your blood sugar is less than 70 mg/dL, you will need to treat for low blood sugar: Do not take insulin. Treat a low blood sugar (less than 70 mg/dL) with  cup of clear juice (cranberry or apple), 4 glucose tablets, OR glucose gel. Recheck blood sugar in 15 minutes after treatment (to make sure it is greater than 70 mg/dL). If your blood sugar is not greater than 70 mg/dL on recheck, call [phone] for further instructions. Report your blood sugar to the short stay nurse when you get to Short Stay.  If you are admitted to the hospital after surgery: Your blood sugar will be checked by the staff and you will probably be given insulin after surgery (instead of oral diabetes medicines) to make sure you have good blood sugar levels. The goal for blood sugar control after surgery is 80-180 mg/dL.    As of today, STOP taking any Aspirin (unless otherwise instructed by your surgeon) Aleve, Naproxen, Ibuprofen, Motrin, Advil, Goody's, BC's, all herbal medications, fish oil, and all vitamins.  Do not wear jewelry or makeup Do not wear lotions, powders, perfumes/colognes, or deodorant. Do not shave 48 hours prior to surgery.  Men may shave face and neck. Do not bring valuables to the hospital. Do not wear nail polish, gel polish, artificial nails, or any other type of covering on natural nails (fingers and toes) If you have artificial nails or gel coating that need to be removed by a nail salon, please  have this removed prior to surgery. Artificial nails or gel coating may interfere with anesthesia's ability to adequately monitor your vital signs.  [facility] is not responsible for any belongings or valuables. .   Do NOT Smoke (Tobacco/Vaping)  24 hours prior to your procedure  If you use a CPAP at night, you may bring your mask for your overnight stay.   Contacts, glasses, hearing aids, dentures or partials may not be worn into surgery, please bring cases for these belongings   For patients admitted to the hospital, discharge time will be determined by your treatment team.   Patients discharged the day of surgery will not be allowed to drive home, and someone needs to stay with them for 24 hours.  NO VISITORS WILL BE ALLOWED IN PRE-OP WHERE PATIENTS ARE PREPPED FOR SURGERY.  ONLY 1 SUPPORT PERSON MAY BE PRESENT IN THE WAITING ROOM WHILE YOU ARE IN SURGERY.  IF YOU ARE TO BE ADMITTED, ONCE YOU ARE IN YOUR ROOM YOU WILL BE ALLOWED TWO (2) VISITORS. 1 (ONE) VISITOR MAY STAY OVERNIGHT BUT MUST ARRIVE TO THE ROOM BY 8pm.  Minor children may have two parents present. Special consideration for safety and communication needs will be reviewed on a case by case basis.  Special instructions:    Oral Hygiene is also important to reduce your risk of infection.  Remember - BRUSH YOUR TEETH THE MORNING OF SURGERY WITH YOUR REGULAR TOOTHPASTE   - Preparing For Surgery  Before surgery, you can play an important role. Because skin is not sterile, your skin needs to be as free of germs as possible. You can reduce the number of germs on your skin by washing with CHG (chlorahexidine gluconate) Soap before surgery.  CHG is an antiseptic cleaner which kills germs and bonds with the skin to continue killing germs even after washing.     Please do not use if you have an allergy to CHG or antibacterial soaps. If your skin becomes reddened/irritated stop using the CHG.  Do not shave (including legs  and underarms) for at least 48 hours prior to first CHG shower. It is OK to shave your face.  Please follow these instructions carefully.     Shower the NIGHT BEFORE SURGERY and the MORNING OF SURGERY with CHG Soap.   If you chose to wash your hair, wash your hair first as usual with your normal shampoo. After you shampoo, rinse your hair and body thoroughly to remove the shampoo.  Then [employer] and genitals (private parts) with your normal soap and rinse thoroughly to remove soap.  After that Use CHG Soap as you would any other liquid soap. You can apply CHG directly to the skin and wash gently with a scrungie or a clean washcloth.   Apply the CHG Soap to your body ONLY FROM THE NECK DOWN.  Do not use on open wounds or open sores. Avoid contact with your eyes, ears, mouth and genitals (private parts). Wash Face and genitals (private parts)  with your normal soap.  Wash thoroughly, paying special attention to the area where your surgery will be performed.  Thoroughly rinse your body with warm water from the neck down.  DO NOT shower/wash with your normal soap after using and rinsing off the CHG Soap.  Pat yourself dry with a CLEAN TOWEL.  Wear CLEAN PAJAMAS to bed the night before surgery  Place CLEAN SHEETS on your bed the night before your surgery  DO NOT SLEEP WITH PETS.   Day of Surgery:  Take a shower with CHG soap. Wear Clean/Comfortable clothing the morning of surgery Do not apply any deodorants/lotions.   Remember to brush your teeth WITH YOUR REGULAR TOOTHPASTE.    COVID testing  If you are going to stay overnight or be admitted after your procedure/surgery and require a pre-op COVID test, please follow these instructions after your COVID test   You are not required to quarantine however you are required to wear a well-fitting mask when you are out and around people not in your household.  If your mask becomes wet or soiled, replace with a new one.  Wash your  hands often with soap and water for 20 seconds or clean your hands with an alcohol-based hand sanitizer that contains at least 60% alcohol.  Do not share personal items.  Notify your provider: if you are in close contact with someone who has COVID  or if you develop a fever of 100.4 or greater, sneezing, cough, sore throat, shortness of breath or body aches.    Please read over the following fact sheets that you were given.

## 2021-08-17 NOTE — Progress Notes (Signed)
GYNECOLOGIC ONCOLOGY NEW PATIENT CONSULTATION   Patient Name: Cynthia [name]  Patient Age: 51 y.o. Date of Service: 08/18/21 Referring Provider: Eppie Gibson, MD [place] 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  The risk of ovarian cancer in patients with BRCA 2 mutations is approximately 10-15% to the age of 76.  In addition, patients with BRCA mutations are also at increased risk of breast cancer, and an increased (but still low) risk of pancreatic cancer and melanoma.  The [employer]  (NCCN) recommends removal of bilateral ovaries and fallopian tubes between the ages of 53-40, and/or when childbearing is completed, to reduce the risk of ovarian and fallopian tube cancer.  If a patient does not undergo risk-reducing surgery, it is recommended they have CA-125 serum levels and pelvic ultrasounds every 6 months as a screening approach, although these are not particularly sensitive methods of screening.  Preventive surgery to remove the ovaries and fallopian tubes reduces the risk of a related cancer by 80% in women who carry a BRCA1 or BRCA2 mutation.  Women who undergo preventive surgery retain a 4% risk of developing cancer of the peritoneum. We discussed this continued risk even after surgery.   The patient is scheduled for bilateral breast surgery next week. We will need to delay surgery by at least 6 weeks to allow her to recover.  We discussed her multiple surgical and medical comorbidities that increase her risk of surgery. Given last Hgb A1c of almost 12%, we will plan to repeat this today. If DM continues to be poorly controlled, then I will recommend delaying surgery until improved glycemic control to decrease peri-operative morbidity. 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 We discussed the risk given location of the transplant kidney in her right pelvis. She has had a CT in the last few years with details the anatomy of her transplanted kidney and ureter. I would not proceed with removal of the right adnexa if I thought there was any significant risk of damaging her transplant.   We will check with cardiology and PCP regarding clearance (she has had recent clearance, but this was for breast surgery and not laparoscopic abdominal surgery).   Given what is to be expected to be at least a two month delay (and given some abdominal symptoms), we will proceed with a CA-125 today and schedule a pelvic ultrasound. We have tentatively discussed a surgery date in about 8 weeks, to allow her to heal from her upcoming breast surgery.  The plan would be for a robotic assisted bilateral salpingo-oophorectomy, possible staging, possible laparotomy. We discussed the small but present risk of incidental cancer at the time of risk-reducing surgery for BRCA mutations. The risks of surgery were discussed in detail and she understands these to include infection; wound separation; hernia; injury to adjacent organs such as bowel, bladder, blood vessels, ureters and nerves; bleeding which may require blood transfusion; anesthesia risk; thromboembolic events; possible death; unforeseen complications; possible need for re-exploration; medical complications such as heart attack, stroke, pleural effusion and pneumonia. The patient will receive DVT and antibiotic prophylaxis as indicated.   The patient will return closer to the date of surgery for a preoperative visit.   Referral to urology was placed given her mixed stress and  urge urinary incontinence symptoms.    A copy of this note was sent to the patient's referring provider.   53 minutes of total time was spent for this patient encounter, including preparation, face-to-face  counseling with the patient and coordination of care, and documentation of the encounter.   Cynthia Pinch, MD  Division of Gynecologic Oncology  Department of Obstetrics and Gynecology  [facility]  ___________________________________________  Chief Complaint: Chief Complaint  Patient presents with   Malignant neoplasm of lower-inner quadrant of left breast i    History of Present Illness:  Cynthia [name] is a 51 y.o. y.o. female who is seen in consultation at the request of Cynthia Gibson, MD for an evaluation of BRCA2 mutation in the setting of a personal history of breast cancer, discussion of risk reducing surgery.  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 She has completed adjuvant chemotherapy.  She is scheduled to have bilateral mastectomies next week.  Genetic testing revealed that she has a BRCA2 mutation.  From an oncologic standpoint, plan will be for postoperative aromatase inhibitor given menopausal status and ER+ cancer.  Her most recent [facility] was consistent with menopause (76.9).  Patient reports overall doing well.  She notes appetite has been up and down.  She has gained some weight recently.  She has about a week of abdominal symptoms including intermittent bloating and feeling gassy as well as nausea.  She describes some baseline epigastric abdominal pain which has been worse for the last week, describes intermittent radiation to the right side of her upper abdomen.  Notes being constipated at baseline, no recent change.  Has intermittent bilateral pelvic pain.  She also endorses symptoms consistent with both stress and urge urinary incontinence.  Patient has been seen by urogynecology, most recently in 2020 for diagnosis of overactive bladder.  At that point, she was started on trospium.  Patient's GYN history is notable for hysterectomy in 2001 which it sounds like was performed for uterine fibroids.   Patient thinks that both fallopian tubes and ovaries were left in situ.  Patient remembers having menopausal symptoms around her mid 67s.  Patient thinks she may have had an abnormal Pap smear about 5 years ago with a repeat normal Pap smear.  The only Pap test that I see in our system is from 2014.  This Pap was negative, high risk HPV not detected.  Treatment History: Oncology History Overview Note  Cancer Staging Malignant neoplasm of lower-inner quadrant of left breast in female, estrogen receptor positive ([place]) Staging form: Breast, AJCC 8th Edition - Clinical stage from 02/09/2021: Stage IA (cT1c, cN0, cM0, G2, ER+, PR+, HER2-) - Signed by Truitt Merle, MD on 02/20/2021 Stage prefix: Initial diagnosis Histologic grading system: 3 grade system - Pathologic stage from 03/02/2021: Stage IA (pT2, pN0, cM0, G2, ER+, PR+, HER2-, Oncotype DX score: 26) - Signed by Truitt Merle, MD on 03/21/2021 Stage prefix: Initial diagnosis Multigene prognostic tests performed: Oncotype DX Recurrence score range: Greater than or equal to 11 Histologic grading system: 3 grade system Residual tumor (R): R0 - None    Malignant neoplasm of lower-inner quadrant of left breast in female, estrogen receptor positive ([place])  02/07/2021 Mammogram   Exam: Left Diagnostic Mammogram; Left Breast Ultrasound  IMPRESSION: Irregular mass in left breast at 6:30, measuring 1.6 cm by mammogram, is highly suggestive of malignancy. Left axillary ultrasound is negative for lymphadenopathy.   02/09/2021 Cancer  Staging   Staging form: Breast, AJCC 8th Edition - Clinical stage from 02/09/2021: Stage IA (cT1c, cN0, cM0, G2, ER+, PR+, HER2-) - Signed by Truitt Merle, MD on 02/20/2021 Stage prefix: Initial diagnosis Histologic grading system: 3 grade system    02/09/2021 Pathology Results   Diagnosis Breast, left, needle core biopsy, 6:30 o'clock 8cm fn - INVASIVE DUCTAL CARCINOMA WITH HISTIOCYTOID FEATURES. SEE NOTE Diagnosis Note Carcinoma  measures 1 cm in greatest linear dimension and appears grade 2.  PROGNOSTIC INDICATORS Results: IMMUNOHISTOCHEMICAL AND MORPHOMETRIC ANALYSIS PERFORMED MANUALLY The tumor cells are EQUIVOCAL for Her2 (2+). Her2 by FISH will be performed and results performed separately. Estrogen Receptor: >95%, POSITIVE, STRONG STAINING INTENSITY Progesterone Receptor: 90%, POSITIVE, STRONG STAINING INTENSITY Proliferation Marker Ki67: 20%  FLUORESCENCE IN-SITU HYBRIDIZATION Results: GROUP 5: HER2 **NEGATIVE** Equivocal form of amplification of the HER2 gene was detected in the IHC 2+ tissue sample received from this individual. HER2 FISH was performed by a technologist and cell imaging and analysis on the [employer]. RATIO OF HER2/CEN17 SIGNALS 1.30 AVERAGE HER2 COPY NUMBER PER CELL 1.75   02/15/2021 Initial Diagnosis   Malignant neoplasm of lower-inner quadrant of left breast in female, estrogen receptor positive ([place])   03/01/2021 Genetic Testing   Positive genetic testing: pathogenic variant detected in BRCA2 at [id number].  No other pathogenic variants detected in [employer] CustomNext Panel.  The report date is 03/19/2021.  The CustomNext-Cancer+RNAinsight panel offered by [facility] includes sequencing and rearrangement analysis for the following 47 genes:  APC, ATM, AXIN2, BARD1, BMPR1A, BRCA1, BRCA2, BRIP1, CDH1, CDK4, CDKN2A, CHEK2, DICER1, EPCAM, GREM1, HOXB13, MEN1, MLH1, MSH2, MSH3, MSH6, MUTYH, NBN, NF1, NF2, NTHL1, PALB2, PMS2, POLD1, POLE, PTEN, RAD51C, RAD51D, RECQL, RET, SDHA, SDHAF2, SDHB, SDHC, SDHD, SMAD4, SMARCA4, STK11, TP53, TSC1, TSC2, and VHL.  RNA data is routinely analyzed for use in variant interpretation for all genes.   03/02/2021 Cancer Staging   Staging form: Breast, AJCC 8th Edition - Pathologic stage from 03/02/2021: Stage IA (pT2, pN0, cM0, G2, ER+, PR+, HER2-, Oncotype DX score: 26) - Signed by Truitt Merle, MD on 03/21/2021 Stage prefix: Initial diagnosis Multigene prognostic  tests performed: Oncotype DX Recurrence score range: Greater than or equal to 11 Histologic grading system: 3 grade system Residual tumor (R): R0 - None    05/02/2021 -  Chemotherapy   Patient is on Treatment Plan : BREAST TC q21d       PAST MEDICAL HISTORY:  Past Medical History:  Diagnosis Date   Allergy    pollen   Anemia    Anxiety    BRCA2 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 03/01/2021   Breast cancer [facility])    Cataract    surgical repair bilateral   Coronary artery disease    Depression    ESRD on hemodialysis ([place])    Home HD 5x per week- not on dialysis now had tramsplant 4/17   Family history of ovarian cancer 02/22/2021   GERD (gastroesophageal reflux disease)    Hearing loss 2017   right ear   History of blood transfusion    transfusion reaction   Hyperlipidemia    Hypertension    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 per pt-dr levy also II; Per patient 08/18/21 she is Type 2   Kidney transplant recipient    Neuromuscular disorder ([place])    NEUROPATHY   Sleep apnea      PAST SURGICAL HISTORY:  Past Surgical History:  Procedure  Laterality Date   ABDOMINAL HYSTERECTOMY     BREAST EXCISIONAL BIOPSY Right 08/2015   BREAST LUMPECTOMY WITH RADIOACTIVE SEED LOCALIZATION Right 09/14/2015   Procedure: RIGHT BREAST LUMPECTOMY WITH RADIOACTIVE SEED LOCALIZATION;  Surgeon: Donnie Mesa, MD;  Location: [place];  Service: General;  Laterality: Right;   BREAST LUMPECTOMY WITH RADIOACTIVE SEED LOCALIZATION Left 03/02/2021   Procedure: LEFT BREAST SEED LUMPECTOMY LEFT SENTINEL LYMPH NODE [place];  Surgeon: Erroll Luna, MD;  Location: [place];  Service: General;  Laterality: Left;  GEN AND PEC BLOCK   BREAST SURGERY Bilateral    biopsy bilateral   CARDIAC CATHETERIZATION     CATARACT EXTRACTION Bilateral    bilateral   CHOLECYSTECTOMY     CYST REMOVAL NECK     DIALYSIS FISTULA  CREATION Left    EYE SURGERY Bilateral    l[facility]   kidney transplant     LEFT HEART CATH AND CORONARY ANGIOGRAPHY N/A 03/04/2019   Procedure: LEFT HEART CATH AND CORONARY ANGIOGRAPHY;  Surgeon: Martinique, Peter M, MD;  Location: [place] CV LAB;  Service: Cardiovascular;  Laterality: N/A;   LEFT HEART CATHETERIZATION WITH CORONARY ANGIOGRAM N/A 03/30/2014   Procedure: LEFT HEART CATHETERIZATION WITH CORONARY ANGIOGRAM;  Surgeon: Sinclair Grooms, MD;  Location: [facility] CATH LAB;  Service: Cardiovascular;  Laterality: N/A;   LIPOMA EXCISION N/A 02/06/2017   Procedure: EXCISION POSTERIOR NECK SEBACEOUS CYST;  Surgeon: Coralie Keens, MD;  Location: [place];  Service: General;  Laterality: N/A;   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   Procedure: REPAIR OF ARTERIOVENOUS FISTULA ANEURYSM;  Surgeon: Serafina Mitchell, MD;  Location: [facility] OR;  Service: Vascular;  Laterality: Left;   REVISON OF ARTERIOVENOUS FISTULA Left 09/28/2013   Procedure: EXCISE ESCHAR LEFT ARM  ARTERIOVENOUS FISTULA WITH PLICATION OF LEFT ARM ARTERIOVENOUS FISTULA;  Surgeon: Elam Dutch, MD;  Location: [place];  Service: Vascular;  Laterality: Left;   REVISON OF ARTERIOVENOUS FISTULA Left 08/26/2015   Procedure: RESECTION ANEURYSM OF LEFT ARM ARTERIOVENOUS FISTULA  ;  Surgeon: Serafina Mitchell, MD;  Location: [facility] OR;  Service: Vascular;  Laterality: Left;   TUBAL LIGATION      OB/GYN HISTORY:  OB History  Gravida Para Term Preterm AB Living  '3 3       3  ' SAB IAB Ectopic Multiple Live Births               # Outcome Date GA Lbr Len/2nd Weight Sex Delivery Anes PTL Lv  3 Para           2 Para           1 Para             No LMP recorded. Patient has had a hysterectomy.  Age at menarche: 70 Age at menopause: Approximately 15 Hx of HRT: Denies Hx of STDs: Denies Last pap: See HPI History of abnormal pap smears: Per patient report yes although I am unable to find results to this effect  SCREENING  STUDIES:  Last mammogram: 2022  Last colonoscopy: 2021  MEDICATIONS: Outpatient Encounter Medications as of 08/18/2021  Medication Sig   acetaminophen (TYLENOL) 500 MG tablet Take 500 mg by mouth every 6 (six) hours as needed for mild pain, fever or headache.   aspirin EC 81 MG EC tablet Take 1 tablet (81 mg total) by mouth daily.   carvedilol (COREG) 25 MG tablet Take 25 mg by mouth 2 (two) times  daily with a meal.   cetirizine (ZYRTEC) 10 MG tablet Take 10 mg by mouth daily.   cholecalciferol (VITAMIN D3) 25 MCG (1000 UNIT) tablet Take 1,000 Units by mouth daily.   cyanocobalamin 1000 MCG tablet Take 1,000 mcg by mouth daily.   escitalopram (LEXAPRO) 10 MG tablet Take 10 mg by mouth daily.   furosemide (LASIX) 40 MG tablet Take 40 mg by mouth 2 (two) times daily as needed for fluid.   gabapentin (NEURONTIN) 300 MG capsule Take 1 capsule (300 mg total) by mouth 2 (two) times daily. Start at 1 cap once daily, and increase to twice daily if she tolerates. Continue 61m at night   insulin glargine (LANTUS SOLOSTAR) 100 UNIT/ML Solostar Pen Inject 25 Units into the skin 2 (two) times daily. (Patient taking differently: Inject 20 Units into the skin 2 (two) times daily.)   magnesium oxide (MAG-OX) 400 MG tablet Take 400 mg by mouth 2 (two) times daily.   MOUNJARO 2.5 MG/0.5ML Pen Inject 2.5 mg into the skin every Thursday.   mycophenolate (MYFORTIC) 180 MG EC tablet Take 360 mg by mouth 2 (two) times daily.   NOVOLOG FLEXPEN 100 UNIT/ML FlexPen Inject 5 Units into the skin 3 (three) times daily with meals.   ondansetron (ZOFRAN) 8 MG tablet Take 1 tablet (8 mg total) by mouth 2 (two) times daily as needed for refractory nausea / vomiting. Start on day 3 after chemo.   pantoprazole (PROTONIX) 40 MG tablet Take 40 mg by mouth 2 (two) times daily.   predniSONE (DELTASONE) 5 MG tablet Take 5 mg by mouth daily.    rosuvastatin (CRESTOR) 5 MG tablet Take 5 mg by mouth at bedtime.   tacrolimus  (PROGRAF) 1 MG capsule Take 3 mg by mouth daily.   Accu-Chek Softclix Lancets lancets Use as instructed (Patient taking differently: 1 each by Other route See admin instructions. Use as instructed)   Blood Glucose Monitoring Suppl (ACCU-CHEK AVIVA CONNECT) w/Device KIT Check sugar 1x daily (Patient taking differently: 1 each by Other route See admin instructions. Check sugar 1x daily)   exemestane (AROMASIN) 25 MG tablet Take 1 tablet (25 mg total) by mouth daily after breakfast. (Patient not taking: Reported on 08/17/2021)   glucose blood (ACCU-CHEK AVIVA PLUS) test strip Check sugar 1x daily (Patient taking differently: 1 each by Other route See admin instructions. Check sugar 1x daily)   Insulin Pen Needle (PEN NEEDLES) 30G X 8 MM MISC 1 each by Does not apply route daily. E11.9   No facility-administered encounter medications on file as of 08/18/2021.    ALLERGIES:  Allergies  Allergen Reactions   Docetaxel Nausea Only    Diaphoretic.  Patient had hypersensitivity reaction to docetaxel.  See progress note from 05/24/2021.  Patient able to complete infusion.   Norvasc [Amlodipine] Swelling   Tape Other (See Comments)    Burns skin.  Please use paper tape only     FAMILY HISTORY:  Family History  Problem Relation Age of Onset   Diabetes Mother    Kidney disease Mother    Diabetes Father    Heart disease Father    Kidney disease Father    Hypertension Father    Diabetes Sister    Asthma Sister    Lupus Sister    Diabetes Brother    Diabetes Brother    Diabetes Brother    Diabetes Brother    Cancer Maternal Grandmother 548      ovarian cancer  Ovarian cancer Maternal Grandmother    Diabetes Maternal Grandfather    Diabetes Paternal Grandmother    Colon cancer Neg Hx    Colon polyps Neg Hx    Esophageal cancer Neg Hx    Rectal cancer Neg Hx    Stomach cancer Neg Hx    Breast cancer Neg Hx    Endometrial cancer Neg Hx    Pancreatic cancer Neg Hx    Prostate cancer Neg Hx       SOCIAL HISTORY:  Social Connections: Not on file    REVIEW OF SYSTEMS:  + Appetite changes, weight gain, hearing loss, ringing in ears, yellowing of the eyes, vision problems, shortness of breath, wheezing, leg swelling, palpitations, abdominal pain, constipation, urinary frequency, hot flashes, pelvic pain, joint pain, back pain, muscle pain/cramps, itching, difficulty with walking, intermittent headaches, numbness, easy bruising and bleeding, anxiety, depression, intermittent confusion. Denies fevers, chills. Denies neck lumps or masses, mouth sores. Denies cough or wheezing.   Denies chest pain. Denies abdominal distention, blood in stools, diarrhea, nausea, vomiting, or early satiety. Denies pain with intercourse, dysuria, hematuria. Denies vaginal bleeding or vaginal discharge.   Denies rash, or wounds. Denies dizziness or seizures. Denies swollen lymph nodes or glands.  Physical Exam:  Vital Signs for this encounter:  Blood pressure (!) 136/51, pulse 83, temperature 98.2 F (36.8 C), temperature source Oral, resp. rate 16, height 5' 1.22" (1.555 m), weight 202 lb 14.4 oz (92 kg), SpO2 98 %. Body mass index is 38.06 kg/m. General: Alert, oriented, no acute distress.  HEENT: Normocephalic, atraumatic. Sclera anicteric.  Chest: Clear to auscultation bilaterally. No wheezes, rhonchi, or rales. Cardiovascular: Regular rate and rhythm, holosystolic murmurs.  Abdomen: Obese. Normoactive bowel sounds. Soft, nondistended, nontender to palpation. No masses or hepatosplenomegaly appreciated. No palpable fluid wave.  Extremities: Grossly normal range of motion. Warm, well perfused. No edema bilaterally.  Skin: No rashes or lesions.  Lymphatics: No cervical, supraclavicular, or inguinal adenopathy.  GU:  Normal external female genitalia. No lesions. No discharge or bleeding.             Bladder/urethra:  No lesions or masses, well supported bladder.             Vagina: Mucosa  without lesions.  No bleeding or discharge noted.             Cervix/uterus: surgically absent.              Adnexa: No masses appreciated.  Rectal: Deferred.  LABORATORY AND RADIOLOGIC DATA:  Outside medical records were reviewed to synthesize the above history, along with the history and physical obtained during the visit.   Lab Results  Component Value Date   WBC 6.0 08/18/2021   HGB 10.8 (L) 08/18/2021   HCT 32.4 (L) 08/18/2021   PLT 174 08/18/2021   GLUCOSE 207 (H) 08/18/2021   CHOL 162 03/29/2014   TRIG 284 (H) 03/29/2014   HDL 31 (L) 03/29/2014   LDLCALC 74 03/29/2014   ALT 18 08/18/2021   AST 14 (L) 08/18/2021   NA 137 08/18/2021   K 4.7 08/18/2021   CL 104 08/18/2021   CREATININE 1.34 (H) 08/18/2021   BUN 32 (H) 08/18/2021   CO2 28 08/18/2021   TSH 5.800 (H) 03/29/2014   INR 0.9 02/27/2019   HGBA1C 7.3 (H) 08/18/2021

## 2021-08-18 ENCOUNTER — Other Ambulatory Visit: Payer: Self-pay

## 2021-08-18 ENCOUNTER — Inpatient Hospital Stay: Payer: Medicare HMO

## 2021-08-18 ENCOUNTER — Encounter (HOSPITAL_COMMUNITY): Payer: Self-pay

## 2021-08-18 ENCOUNTER — Encounter (HOSPITAL_COMMUNITY)
Admission: RE | Admit: 2021-08-18 | Discharge: 2021-08-18 | Disposition: A | Discharge status: Home / Self Care | Payer: Medicare HMO | Source: Ambulatory Visit | Attending: Surgery | Admitting: Surgery

## 2021-08-18 ENCOUNTER — Inpatient Hospital Stay (HOSPITAL_BASED_OUTPATIENT_CLINIC_OR_DEPARTMENT_OTHER): Payer: Medicare HMO | Admitting: Gynecologic Oncology

## 2021-08-18 ENCOUNTER — Encounter: Payer: Self-pay | Admitting: Gynecologic Oncology

## 2021-08-18 ENCOUNTER — Other Ambulatory Visit (HOSPITAL_COMMUNITY): Payer: Medicare HMO

## 2021-08-18 VITALS — BP 138/59 | HR 87 | Temp 98.2°F | Resp 17 | Ht 61.0 in | Wt 202.0 lb

## 2021-08-18 VITALS — BP 136/51 | HR 83 | Temp 98.2°F | Resp 16 | Ht 61.22 in | Wt 202.9 lb

## 2021-08-18 DIAGNOSIS — Z20822 Contact with and (suspected) exposure to covid-19: Secondary | ICD-10-CM | POA: Insufficient documentation

## 2021-08-18 DIAGNOSIS — Z6838 Body mass index (BMI) 38.0-38.9, adult: Secondary | ICD-10-CM

## 2021-08-18 DIAGNOSIS — E1022 Type 1 diabetes mellitus with diabetic chronic kidney disease: Secondary | ICD-10-CM

## 2021-08-18 DIAGNOSIS — Z01812 Encounter for preprocedural laboratory examination: Secondary | ICD-10-CM | POA: Insufficient documentation

## 2021-08-18 DIAGNOSIS — E119 Type 2 diabetes mellitus without complications: Secondary | ICD-10-CM

## 2021-08-18 DIAGNOSIS — Z1501 Genetic susceptibility to malignant neoplasm of breast: Secondary | ICD-10-CM

## 2021-08-18 DIAGNOSIS — Z148 Genetic carrier of other disease: Secondary | ICD-10-CM | POA: Diagnosis not present

## 2021-08-18 DIAGNOSIS — Z1509 Genetic susceptibility to other malignant neoplasm: Secondary | ICD-10-CM

## 2021-08-18 DIAGNOSIS — N3946 Mixed incontinence: Secondary | ICD-10-CM

## 2021-08-18 DIAGNOSIS — C50312 Malignant neoplasm of lower-inner quadrant of left female breast: Secondary | ICD-10-CM

## 2021-08-18 DIAGNOSIS — Z794 Long term (current) use of insulin: Secondary | ICD-10-CM | POA: Insufficient documentation

## 2021-08-18 DIAGNOSIS — Z01818 Encounter for other preprocedural examination: Secondary | ICD-10-CM

## 2021-08-18 DIAGNOSIS — Z940 Kidney transplant status: Secondary | ICD-10-CM

## 2021-08-18 DIAGNOSIS — Z170 Estrogen receptor positive status [ER+]: Secondary | ICD-10-CM

## 2021-08-18 DIAGNOSIS — Z1502 Genetic susceptibility to malignant neoplasm of ovary: Secondary | ICD-10-CM | POA: Diagnosis not present

## 2021-08-18 HISTORY — DX: Atherosclerotic heart disease of native coronary artery without angina pectoris: I25.10

## 2021-08-18 LAB — CBC WITH DIFFERENTIAL (CANCER CENTER ONLY)
Abs Immature Granulocytes: 0.02 10*3/uL (ref 0.00–0.07)
Basophils Absolute: 0.1 10*3/uL (ref 0.0–0.1)
Basophils Relative: 1 %
Eosinophils Absolute: 0.1 10*3/uL (ref 0.0–0.5)
Eosinophils Relative: 2 %
HCT: 32.4 % — ABNORMAL LOW (ref 36.0–46.0)
Hemoglobin: 10.8 g/dL — ABNORMAL LOW (ref 12.0–15.0)
Immature Granulocytes: 0 %
Lymphocytes Relative: 16 %
Lymphs Abs: 1 10*3/uL (ref 0.7–4.0)
MCH: 31.7 pg (ref 26.0–34.0)
MCHC: 33.3 g/dL (ref 30.0–36.0)
MCV: 95 fL (ref 80.0–100.0)
Monocytes Absolute: 0.5 10*3/uL (ref 0.1–1.0)
Monocytes Relative: 9 %
Neutro Abs: 4.3 10*3/uL (ref 1.7–7.7)
Neutrophils Relative %: 72 %
Platelet Count: 174 10*3/uL (ref 150–400)
RBC: 3.41 MIL/uL — ABNORMAL LOW (ref 3.87–5.11)
RDW: 13.4 % (ref 11.5–15.5)
WBC Count: 6 10*3/uL (ref 4.0–10.5)
nRBC: 0 % (ref 0.0–0.2)

## 2021-08-18 LAB — CMP (CANCER CENTER ONLY)
ALT: 18 U/L (ref 0–44)
AST: 14 U/L — ABNORMAL LOW (ref 15–41)
Albumin: 4 g/dL (ref 3.5–5.0)
Alkaline Phosphatase: 73 U/L (ref 38–126)
Anion gap: 5 (ref 5–15)
BUN: 32 mg/dL — ABNORMAL HIGH (ref 6–20)
CO2: 28 mmol/L (ref 22–32)
Calcium: 10.2 mg/dL (ref 8.9–10.3)
Chloride: 104 mmol/L (ref 98–111)
Creatinine: 1.34 mg/dL — ABNORMAL HIGH (ref 0.44–1.00)
GFR, Estimated: 48 mL/min — ABNORMAL LOW (ref >60)
Glucose, Bld: 207 mg/dL — ABNORMAL HIGH (ref 70–99)
Potassium: 4.7 mmol/L (ref 3.5–5.1)
Sodium: 137 mmol/L (ref 135–145)
Total Bilirubin: 0.3 mg/dL (ref 0.3–1.2)
Total Protein: 6.1 g/dL — ABNORMAL LOW (ref 6.5–8.1)

## 2021-08-18 LAB — GLUCOSE, CAPILLARY: Glucose-Capillary: 224 mg/dL — ABNORMAL HIGH (ref 70–99)

## 2021-08-18 NOTE — Patient Instructions (Signed)
It was good to meet you today.  I will let you know once I have the results of your blood test and your ultrasound.  We will tentatively plan on surgery to remove your tubes and ovaries about 8 weeks after your breast surgery. We will have you return in about 6 weeks for a preoperative visit closer to the time of surgery.

## 2021-08-18 NOTE — Progress Notes (Signed)
PCP - Arthur Holms, NP Cardiologist - Dr. Harrell Gave; Clearance in Epic  PPM/ICD - n/a Device Orders -  Rep Notified -   Chest x-ray - 05/28/21 EKG - 08/09/21 Stress Test - 02/19/2019 ECHO - 11/28/2020  Cardiac Cath - 03/04/2019  Sleep Study - 01/12/21 CPAP - wears nightly  Fasting Blood Sugar - 69-98 Checks Blood Sugar many times a day because she wears a continuous CBG monitor  Blood Thinner Instructions: Aspirin Instructions:  ERAS Protcol - clears until 0930 PRE-SURGERY Ensure or G2- none ordered  COVID TEST- at PAT   Anesthesia review: yes, history of CAD, hx of renal transplant  Patient denies shortness of breath, fever, cough and chest pain at PAT appointment   All instructions explained to the patient, with a verbal understanding of the material. Patient agrees to go over the instructions while at home for a better understanding. Patient also instructed to self quarantine after being tested for COVID-19. The opportunity to ask questions was provided.

## 2021-08-18 NOTE — Progress Notes (Signed)
Surgical Instructions    Your procedure is scheduled on Tuesday February 28.  Report to [facility] Main Entrance "A" at 10:30 A.M., then check in with the Admitting office.  Call this number if you have problems the morning of surgery:  [phone]   If you have any questions prior to your surgery date call [phone]: Open Monday-Friday 8am-4pm    Remember:  Do not eat after midnight the night before your surgery  You may drink clear liquids until 9:30am the morning of your surgery.   Clear liquids allowed are: Water, Non-Citrus Juices (without pulp), Carbonated Beverages, Clear Tea, Black Coffee ONLY (NO MILK, CREAM OR POWDERED CREAMER of any kind), and Gatorade    Take these medicines the morning of surgery with A SIP OF WATER:  carvedilol (COREG)  cetirizine (ZYRTEC)  escitalopram (LEXAPRO)  gabapentin (NEURONTIN) mycophenolate (MYFORTIC) pantoprazole (PROTONIX) predniSONE (DELTASONE) tacrolimus (PROGRAF)  If needed, you may take:  acetaminophen (TYLENOL) ondansetron (ZOFRAN)    WHAT DO I DO ABOUT MY DIABETES MEDICATION?  THE NIGHT BEFORE SURGERY, take 10 units of _insulin glargine (LANTUS SOLOSTAR) insulin.    THE MORNING OF SURGERY, take 10 units of insulin glargine (LANTUS SOLOSTAR) insulin.  If your CBG is greater than 220 mg/dL, you may take  of your NOVOLOG FLEXPEN (correction) dose of insulin the morning of surgery.   HOW TO MANAGE YOUR DIABETES BEFORE AND AFTER SURGERY  Why is it important to control my blood sugar before and after surgery? Improving blood sugar levels before and after surgery helps healing and can limit problems. A way of improving blood sugar control is eating a healthy diet by:  Eating less sugar and carbohydrates  Increasing activity/exercise  Talking with your doctor about reaching your blood sugar goals High blood sugars (greater than 180 mg/dL) can raise your risk of infections and slow your recovery, so you will need to focus  on controlling your diabetes during the weeks before surgery. Make sure that the doctor who takes care of your diabetes knows about your planned surgery including the date and location.  How do I manage my blood sugar before surgery? Check your blood sugar at least 4 times a day, starting 2 days before surgery, to make sure that the level is not too high or low.  Check your blood sugar the morning of your surgery when you wake up and every 2 hours until you get to the Short Stay unit.  If your blood sugar is less than 70 mg/dL, you will need to treat for low blood sugar: Do not take insulin. Treat a low blood sugar (less than 70 mg/dL) with  cup of clear juice (cranberry or apple), 4 glucose tablets, OR glucose gel. Recheck blood sugar in 15 minutes after treatment (to make sure it is greater than 70 mg/dL). If your blood sugar is not greater than 70 mg/dL on recheck, call [phone] for further instructions. Report your blood sugar to the short stay nurse when you get to Short Stay.  If you are admitted to the hospital after surgery: Your blood sugar will be checked by the staff and you will probably be given insulin after surgery (instead of oral diabetes medicines) to make sure you have good blood sugar levels. The goal for blood sugar control after surgery is 80-180 mg/dL.    As of today, STOP taking any Aspirin (unless otherwise instructed by your surgeon) Aleve, Naproxen, Ibuprofen, Motrin, Advil, Goody's, BC's, all herbal medications, fish oil, and all vitamins.  Do not wear jewelry or makeup Do not wear lotions, powders, perfumes/colognes, or deodorant. Do not shave 48 hours prior to surgery.  Men may shave face and neck. Do not bring valuables to the hospital. Do not wear nail polish, gel polish, artificial nails, or any other type of covering on natural nails (fingers and toes) If you have artificial nails or gel coating that need to be removed by a nail salon, please  have this removed prior to surgery. Artificial nails or gel coating may interfere with anesthesia's ability to adequately monitor your vital signs.  [facility] is not responsible for any belongings or valuables. .   Do NOT Smoke (Tobacco/Vaping)  24 hours prior to your procedure  If you use a CPAP at night, you may bring your mask for your overnight stay.   Contacts, glasses, hearing aids, dentures or partials may not be worn into surgery, please bring cases for these belongings   For patients admitted to the hospital, discharge time will be determined by your treatment team.   Patients discharged the day of surgery will not be allowed to drive home, and someone needs to stay with them for 24 hours.  NO VISITORS WILL BE ALLOWED IN PRE-OP WHERE PATIENTS ARE PREPPED FOR SURGERY.  ONLY 1 SUPPORT PERSON MAY BE PRESENT IN THE WAITING ROOM WHILE YOU ARE IN SURGERY.  IF YOU ARE TO BE ADMITTED, ONCE YOU ARE IN YOUR ROOM YOU WILL BE ALLOWED TWO (2) VISITORS. 1 (ONE) VISITOR MAY STAY OVERNIGHT BUT MUST ARRIVE TO THE ROOM BY 8pm.  Minor children may have two parents present. Special consideration for safety and communication needs will be reviewed on a case by case basis.  Special instructions:    Oral Hygiene is also important to reduce your risk of infection.  Remember - BRUSH YOUR TEETH THE MORNING OF SURGERY WITH YOUR REGULAR TOOTHPASTE   [place]- Preparing For Surgery  Before surgery, you can play an important role. Because skin is not sterile, your skin needs to be as free of germs as possible. You can reduce the number of germs on your skin by washing with CHG (chlorahexidine gluconate) Soap before surgery.  CHG is an antiseptic cleaner which kills germs and bonds with the skin to continue killing germs even after washing.     Please do not use if you have an allergy to CHG or antibacterial soaps. If your skin becomes reddened/irritated stop using the CHG.  Do not shave (including legs  and underarms) for at least 48 hours prior to first CHG shower. It is OK to shave your face.  Please follow these instructions carefully.     Shower the NIGHT BEFORE SURGERY and the MORNING OF SURGERY with CHG Soap.   If you chose to wash your hair, wash your hair first as usual with your normal shampoo. After you shampoo, rinse your hair and body thoroughly to remove the shampoo.  Then [employer] and genitals (private parts) with your normal soap and rinse thoroughly to remove soap.  After that Use CHG Soap as you would any other liquid soap. You can apply CHG directly to the skin and wash gently with a scrungie or a clean washcloth.   Apply the CHG Soap to your body ONLY FROM THE NECK DOWN.  Do not use on open wounds or open sores. Avoid contact with your eyes, ears, mouth and genitals (private parts). Wash Face and genitals (private parts)  with your normal soap.  Wash thoroughly, paying special attention to the area where your surgery will be performed.  Thoroughly rinse your body with warm water from the neck down.  DO NOT shower/wash with your normal soap after using and rinsing off the CHG Soap.  Pat yourself dry with a CLEAN TOWEL.  Wear CLEAN PAJAMAS to bed the night before surgery  Place CLEAN SHEETS on your bed the night before your surgery  DO NOT SLEEP WITH PETS.   Day of Surgery:  Take a shower with CHG soap. Wear Clean/Comfortable clothing the morning of surgery Do not apply any deodorants/lotions.   Remember to brush your teeth WITH YOUR REGULAR TOOTHPASTE.    COVID testing  If you are going to stay overnight or be admitted after your procedure/surgery and require a pre-op COVID test, please follow these instructions after your COVID test   You are not required to quarantine however you are required to wear a well-fitting mask when you are out and around people not in your household.  If your mask becomes wet or soiled, replace with a new one.  Wash your  hands often with soap and water for 20 seconds or clean your hands with an alcohol-based hand sanitizer that contains at least 60% alcohol.  Do not share personal items.  Notify your provider: if you are in close contact with someone who has COVID  or if you develop a fever of 100.4 or greater, sneezing, cough, sore throat, shortness of breath or body aches.    Please read over the following fact sheets that you were given.

## 2021-08-19 ENCOUNTER — Encounter: Payer: Self-pay | Admitting: Hematology

## 2021-08-19 LAB — HEMOGLOBIN A1C
Hgb A1c MFr Bld: 7.3 % — ABNORMAL HIGH (ref 4.8–5.6)
Mean Plasma Glucose: 163 mg/dL

## 2021-08-19 LAB — SARS CORONAVIRUS 2 (TAT 6-24 HRS): SARS Coronavirus 2: NEGATIVE

## 2021-08-19 LAB — CA 125: Cancer Antigen (CA) 125: 9.1 U/mL (ref 0.0–38.1)

## 2021-08-21 ENCOUNTER — Ambulatory Visit: Payer: Medicare HMO | Admitting: Physical Therapy

## 2021-08-21 ENCOUNTER — Telehealth: Payer: Self-pay | Admitting: *Deleted

## 2021-08-21 ENCOUNTER — Other Ambulatory Visit: Payer: Self-pay

## 2021-08-21 ENCOUNTER — Encounter: Payer: Self-pay | Admitting: Physical Therapy

## 2021-08-21 DIAGNOSIS — R293 Abnormal posture: Secondary | ICD-10-CM

## 2021-08-21 DIAGNOSIS — Z483 Aftercare following surgery for neoplasm: Secondary | ICD-10-CM

## 2021-08-21 DIAGNOSIS — M6281 Muscle weakness (generalized): Secondary | ICD-10-CM

## 2021-08-21 DIAGNOSIS — C50312 Malignant neoplasm of lower-inner quadrant of left female breast: Secondary | ICD-10-CM

## 2021-08-21 NOTE — Therapy (Signed)
OUTPATIENT PHYSICAL THERAPY TREATMENT NOTE   Patient Name: Cynthia [name] MRN: [id number] DOB:May 01, 1971, 51 y.o., female Today's Date: 08/21/2021  PCP: Arthur Holms, NP REFERRING PROVIDER: Truitt Merle, MD   PT End of Session - 08/21/21 0844     Visit Number 4    Number of Visits 19    Date for PT Re-Evaluation 10/09/21    Authorization Type humana - update auth as approved    PT Start Time 0807    PT Stop Time 0849    PT Time Calculation (min) 42 min    Activity Tolerance Patient tolerated treatment well    Behavior During Therapy [facility] for tasks assessed/performed             Past Medical History:  Diagnosis Date   Allergy    pollen   Anemia    Anxiety    BRCA2 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 03/01/2021   Breast cancer [facility])    Cataract    surgical repair bilateral   Coronary artery disease    Depression    ESRD on hemodialysis ([place])    Home HD 5x per week- not on dialysis now had tramsplant 4/17   Family history of ovarian cancer 02/22/2021   GERD (gastroesophageal reflux disease)    Hearing loss 2017   right ear   History of blood transfusion    transfusion reaction   Hyperlipidemia    Hypertension    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 per pt-dr levy also II; Per patient 08/18/21 she is Type 2   Kidney transplant recipient    Neuromuscular disorder ([place])    NEUROPATHY   Sleep apnea    Past Surgical History:  Procedure Laterality Date   ABDOMINAL HYSTERECTOMY     BREAST EXCISIONAL BIOPSY Right 08/2015   BREAST LUMPECTOMY WITH RADIOACTIVE SEED LOCALIZATION Right 09/14/2015   Procedure: RIGHT BREAST LUMPECTOMY WITH RADIOACTIVE SEED LOCALIZATION;  Surgeon: Donnie Mesa, MD;  Location: [place];  Service: General;  Laterality: Right;   BREAST LUMPECTOMY WITH RADIOACTIVE SEED LOCALIZATION Left 03/02/2021   Procedure: LEFT BREAST SEED LUMPECTOMY LEFT SENTINEL LYMPH NODE [place];   Surgeon: Erroll Luna, MD;  Location: [place];  Service: General;  Laterality: Left;  GEN AND PEC BLOCK   BREAST SURGERY Bilateral    biopsy bilateral   CARDIAC CATHETERIZATION     CATARACT EXTRACTION Bilateral    bilateral   CHOLECYSTECTOMY     CYST REMOVAL NECK     DIALYSIS FISTULA CREATION Left    EYE SURGERY Bilateral    lazer   kidney transplant     LEFT HEART CATH AND CORONARY ANGIOGRAPHY N/A 03/04/2019   Procedure: LEFT HEART CATH AND CORONARY ANGIOGRAPHY;  Surgeon: Martinique, Peter M, MD;  Location: [place] CV LAB;  Service: Cardiovascular;  Laterality: N/A;   LEFT HEART CATHETERIZATION WITH CORONARY ANGIOGRAM N/A 03/30/2014   Procedure: LEFT HEART CATHETERIZATION WITH CORONARY ANGIOGRAM;  Surgeon: Sinclair Grooms, MD;  Location: [facility] CATH LAB;  Service: Cardiovascular;  Laterality: N/A;   LIPOMA EXCISION N/A 02/06/2017   Procedure: EXCISION POSTERIOR NECK SEBACEOUS CYST;  Surgeon: Coralie Keens, MD;  Location: [place];  Service: General;  Laterality: N/A;   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   Procedure: REPAIR OF ARTERIOVENOUS FISTULA ANEURYSM;  Surgeon: Serafina Mitchell, MD;  Location: [place];  Service: Vascular;  Laterality: Left;   REVISON OF ARTERIOVENOUS FISTULA Left  09/28/2013   Procedure: EXCISE ESCHAR LEFT ARM  ARTERIOVENOUS FISTULA WITH PLICATION OF LEFT ARM ARTERIOVENOUS FISTULA;  Surgeon: Elam Dutch, MD;  Location: [place];  Service: Vascular;  Laterality: Left;   REVISON OF ARTERIOVENOUS FISTULA Left 08/26/2015   Procedure: RESECTION ANEURYSM OF LEFT ARM ARTERIOVENOUS FISTULA  ;  Surgeon: Serafina Mitchell, MD;  Location: [place];  Service: Vascular;  Laterality: Left;   TUBAL LIGATION     Patient Active Problem List   Diagnosis Date Noted   Port-A-Cath in place 07/12/2021   Hyponatremia 07/03/2021   UTI (urinary tract infection) 07/02/2021   Leukocytosis 47/65/4650   Complicated UTI (urinary tract infection)  06/13/2021   PICC (peripherally inserted central catheter) in place 06/13/2021   Acute UTI 05/28/2021   Stage 3a chronic kidney disease (CKD) ([place]) 05/28/2021   Thrombocytopenia ([place]) 05/28/2021   Aortic atherosclerosis ([place]) 03/07/2021   Hypercholesterolemia 03/07/2021   BRCA2 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 03/01/2021   Genetic testing 03/01/2021   Family history of ovarian cancer 02/22/2021   Malignant neoplasm of lower-inner quadrant of left breast in female, estrogen receptor positive ([place]) 02/15/2021   Foot pain, bilateral 07/28/2020   COVID-19 virus infection 06/16/2019   Insulin dependent type 2 diabetes mellitus ([place]) 06/06/2019   Hyperglycemia 06/02/2019   DKA (diabetic ketoacidoses) 06/02/2019   HTN (hypertension) 03/04/2019   Abnormal nuclear stress test 03/04/2019   Diabetic nephropathy ([place]) 03/03/2019   History of renal transplant 11/18/2015   Diabetic retinopathy associated with type 2 diabetes mellitus ([place]) 04/13/2015   Postprandial vomiting    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   Anemia 01/04/2015   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   Hyperkalemia 11/16/2013   Chronic constipation 07/28/2013   Paresthesias 07/28/2013   Diabetes mellitus type I ([place])    Kidney transplant status 05/28/2013   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/11/2013   Precordial chest pain 02/10/2013   Awaiting organ transplant 09/19/2012    REFERRING DIAG: neuropathy and breast cancer  THERAPY DIAG:  Muscle weakness (generalized)  Aftercare following surgery for neoplasm  Abnormal posture  Malignant neoplasm of lower-inner quadrant of left breast in female, estrogen receptor positive ([place])  PERTINENT HISTORY: PERTINENT HISTORY:  Left breast cancer s/p left lumpectomy 03/02/21 due to grade 2 IDC with negative LN.  Due to high risk oncotype pt started adjuvant TC after delayed wound healing. Pending bil mastectomy with Dr. Brantley Stage. On gabapentin for diabetic neuropathy and  long term steroids for kidney transplant. Other history includes: CAD, type 1 DM, HTN, s/p kidney transplant 2017.   PRECAUTIONS: PRECAUTIONS: Other: lymphedema risk Lt UE   WEIGHT BEARING RESTRICTIONS No  SUBJECTIVE: Feeling fine today.   PAIN:  Are you having pain? No  COGNITION:            Overall cognitive status: Within functional limits for tasks assessed      POSTURE: WNL     UPPER EXTREMITY STRENGTH:   Rt                                 Lt Flexion                         4-                                 4- Abduction  4                                  4 Elbow flexion               4                                  4             Elbow extension          4+                                4+             Wrist extension           4                                  4                           LE MMT:  Hip Flexion:                4-                                 4- Abduction                   4-                                 4 ER                              4                                  4 Knee extension          4                                  4 Flexion                       4-                                 4- Ankle DF                    4-                                 4       Test Score Norms  TUG 80ft 10 seconds High Fall risk >/= 14 sec Any fall risk: 12+ sec Risk of developing disability: 9+  Gait Speed (distance (m)/time)   <0.15m/s household only 0.4-0.8 limited community  0.8-1.2 community 1.2 able to cross streets   Needs intervention for fall risk: <55ms  5 time sit to stand (16) 16 seconds  >13.6 sec= increased disability >15sec predicts recurrent falls    30 second sit to stand (17)       <8 lower functional ability See below: minimum to maintain independent mobility   Grip Strength   Rt:              35                        Lt: 30   Normal 55lbs for age  56 min walk   Not if: resting HR>120, >180/1043mg     Cardiac risk factors: peak HR<120-130BPM or rise of <20BPM   Beta blocker: HR not reliable use RPE and BP   Baseline HR:            BP:            O2:    Distance:   Post HR:            BP:            RPE:            SOB:    [id number] [id number] <1085ft64fmore likely to fall   Berg 38/56     <45 high risk of falls <40 almost 100% fall risk     PATIENT EDUCATION:  Education details: POC Person educated: Patient Education method: Explanation Education comprehension: verbalized understanding   TREATMENT 08/21/21:    In // bars:  Heel/toe walking x 4 with occasional 1 HHA on bar    Heel raises x 20 with occasional 1 HHA - then     Seated recovery period    Hip flexion x 10 bilat with occasional CGA with     Increased difficulty on L     Hip ext x 10 reps bilat with occasional CGA    Hip abd x 10 reps bilat with occasional CGA    Standing on airex with EO x 1 min with increased     Sway    Standing on airex with EC x 30 sec with increased    Difficulty    Marching on airex with no HHA x 15 bilat     Color circle taps x 12 with no losses of balance      Then color taps with green cones on circles     With no losses of balance   Seated knee extension with 3 sec holds x 10 reps bilat   Nustep x 5 min on Level 1 with seat at 6 and no Ues  L-DEX LYMPHEDEMA SCREENING Measurement Type: Unilateral L-DEX MEASUREMENT EXTREMITY: Upper Extremity POSITION : Standing DOMINANT SIDE: Right At Risk Side: Left (Left) BASELINE SCORE (UNILATERAL): 12.7 L-DEX SCORE (UNILATERAL): 11.3 VALUE CHANGE (UNILAT): -1.4    ASSESSMENT:   CLINICAL IMPRESSION: Pt returns to Pt today. Began LE balance and strengthening exercises. Reassessed l dex score today and her change from baseline was -1.4 so pt does not demonstrate any signs of subclinical lymphedema. She will be undergoing a bilateral mastectomy but will not be having any additional lymph nodes removed. Pt did fairly  well with balance exercises today. She had the most difficulty with walking heel to toe in the // bars. She also demonstrated increased sway when standing on air ex and would benefit from additional exercises on  airex to improve balance. She did well with toe tapping different colored circles in a clock shape without losing her balance and she did not require HHA for this.      OBJECTIVE IMPAIRMENTS decreased activity tolerance, difficulty walking, and decreased strength.    ACTIVITY LIMITATIONS cleaning and shopping.    PERSONAL FACTORS Age, Fitness, and 3+ comorbidities: SLNB, chemotherapy, DM  are also affecting patient's functional outcome.      REHAB POTENTIAL: Good   CLINICAL DECISION MAKING: Stable/uncomplicated   EVALUATION COMPLEXITY: Low     GOALS: Goals reviewed with patient?Yes   LONG TERM GOALS:    LTG Name Target Date Goal status  1 Pt will improve Berg balance score to >45 to demonstrate lower fall risk Baseline: 38 10/09/2021 INITIAL  2 Pt will improve sit to stand x 5 in less than 15 seconds to lower fall risk Baseline: 16 seconds 10/09/2021 INITIAL  3 Pt will report improved ability to walk up the stairs at home Baseline: has to stop half way 10/09/2021 INITIAL  4 Pt will be ind with final HEP Baseline: 10/09/2021 INITIAL                               PLAN: PT FREQUENCY: 2x/week   PT DURATION: 8 weeks   PLANNED INTERVENTIONS: Therapeutic exercises, Therapeutic activity, Neuro Muscular re-education, Balance training, Gait training, Patient/Family education, and Joint mobilization   PLAN FOR NEXT SESSION: SOZO done today but did not schedule 3 month follow up so pt will need another sozo scheduled in early June,  cont balance and general LE strength activities   [employer], PT 08/21/2021, 10:07 AM

## 2021-08-21 NOTE — Anesthesia Preprocedure Evaluation (Addendum)
Anesthesia Evaluation  Patient identified by MRN, date of birth, ID band Patient awake    Reviewed: Allergy & Precautions, NPO status , Patient's Chart, lab work & pertinent test results  History of Anesthesia Complications Negative for: history of anesthetic complications  Airway Mallampati: III  TM Distance: >3 FB Neck ROM: Full    Dental  (+) Dental Advisory Given, Teeth Intact   Pulmonary neg shortness of breath, sleep apnea and Continuous Positive Airway Pressure Ventilation , neg COPD, neg recent URI,    breath sounds clear to auscultation       Cardiovascular hypertension, Pt. on medications and Pt. on home beta blockers (-) angina+ CAD   Rhythm:Regular     Neuro/Psych PSYCHIATRIC DISORDERS Anxiety Depression  Neuromuscular disease    GI/Hepatic Neg liver ROS, GERD  ,  Endo/Other  diabetes, Insulin DependentMorbid obesity  Renal/GU CRFRenal diseaseLab Results      Component                Value               Date                      CREATININE               1.34 (H)            08/18/2021           S/p transplant      Musculoskeletal   Abdominal   Peds  Hematology  (+) Blood dyscrasia, anemia , Lab Results      Component                Value               Date                      WBC                      6.0                 08/18/2021                HGB                      10.8 (L)            08/18/2021                HCT                      32.4 (L)            08/18/2021                MCV                      95.0                08/18/2021                PLT                      174                 08/18/2021              Anesthesia Other Findings  Reproductive/Obstetrics                            Anesthesia Physical Anesthesia Plan  ASA: 3  Anesthesia Plan: Regional and General   Post-op Pain Management: Regional block* and Ofirmev IV (intra-op)*   Induction:  Intravenous  PONV Risk Score and Plan: 3 and Propofol infusion, TIVA and Treatment may vary due to age or medical condition  Airway Management Planned: Oral ETT and LMA  Additional Equipment: None  Intra-op Plan:   Post-operative Plan: Extubation in OR  Informed Consent: I have reviewed the patients History and Physical, chart, labs and discussed the procedure including the risks, benefits and alternatives for the proposed anesthesia with the patient or authorized representative who has indicated his/her understanding and acceptance.     Dental advisory given  Plan Discussed with: CRNA and Anesthesiologist  Anesthesia Plan Comments: (PAT note by Karoline Caldwell, PA-C: Patient follows with cardiology for history of nonobstructive CAD, HTN, mild-moderate MR.  She was seen by Dr. Al Pimple on 08/08/2021 for preop evaluation.  She was noted to be doing well, but did have some fatigue from her chemo treatments.  Per note, "Preoperative cardiovascular evaluation. Based on available date, patient's RCRI score =1 (insulin), which carries a 6% 30-day risk of death, MI, or cardiac arrest. The patient is not currently having active cardiac symptoms, and they can achieve >4 METs of activity. According to ACC/AHA Guidelines, no further testing is needed. Proceed with surgery at acceptable risk. Our service is available as needed in the peri-operative period. Aspirin can be held peri-operatively if needed, restart when reasonable from surgical perspective."  OSA on CPAP.  History of ESRD now s/p kidney transplant 10/15/2015 at [facility]  Last seen 12/16/2020, stable at that time, advised to follow-up in 1 year.  IDDM type I.  Preop labs reviewed, creatinine mildly elevated at 1.34, mild anemia with hemoglobin 10.8, A1c 7.3.  Labs otherwise unremarkable.  EKG 08/08/2021: NSR.  Rate 76.  LAD.  Echo 11/28/20--[facility] -LVEF normal, mild LVH, EF 60-65% -mild LAE, normal RA -RV normal  size/function. RVSP 39 mmHg -mild to moderate MR. Normal appearing valve -trace TR -no effusion or intracardiac mass, normal aorta/arch  Nuclear stress test 11/24/20 ([facility]iscan)--[facility] -normal ECG at rest and stress -both rest and stress images normal -LVEF 63% with normal wall motion -no evidence of ischemia  Cath 03/04/2019 ? Prox LAD to Mid LAD lesion is 10% stenosed. ? Prox Cx to Mid Cx lesion is 20% stenosed. ? 1st Mrg lesion is 20% stenosed. ? Prox RCA to Mid RCA lesion is 10% stenosed. ? The left ventricular systolic function is normal. ? LV end diastolic pressure is normal. ? The left ventricular ejection fraction is 55-65% by visual estimate.  1. Minor nonobstructive CAD 2. Normal LV function 3. Normal LVEDP )       Anesthesia Quick Evaluation

## 2021-08-21 NOTE — Telephone Encounter (Signed)
Per Dr Cynthia [name], fax records and referral to [facility]

## 2021-08-21 NOTE — Progress Notes (Signed)
Anesthesia Chart Review:  Patient follows with cardiology for history of nonobstructive CAD, HTN, mild-moderate MR.  She was seen by Dr. Al Pimple on 08/08/2021 for preop evaluation.  She was noted to be doing well, but did have some fatigue from her chemo treatments.  Per note, "Preoperative cardiovascular evaluation. Based on available date, patient's RCRI score = 1 (insulin), which carries a 6% 30-day risk of death, MI, or cardiac arrest. The patient is not currently having active cardiac symptoms, and they can achieve >4 METs of activity. According to ACC/AHA Guidelines, no further testing is needed.  Proceed with surgery at acceptable risk.  Our service is available as needed in the peri-operative period.  Aspirin can be held peri-operatively if needed, restart when reasonable from surgical perspective."  OSA on CPAP.  History of ESRD now s/p kidney transplant 10/15/2015 at [facility].  Last seen 12/16/2020, stable at that time, advised to follow-up in 1 year.  IDDM type I.  Preop labs reviewed, creatinine mildly elevated at 1.34, mild anemia with hemoglobin 10.8, A1c 7.3.  Labs otherwise unremarkable.  EKG 08/08/2021: NSR.  Rate 76.  LAD.  Echo 11/28/20--[facility] -LVEF normal, mild LVH, EF 60-65% -mild LAE, normal RA -RV normal size/function. RVSP 39 mmHg -mild to moderate MR. Normal appearing valve -trace TR -no effusion or intracardiac mass, normal aorta/arch   Nuclear stress test 11/24/20 ([facility])--[facility] -normal ECG at rest and stress -both rest and stress images normal -LVEF 63% with normal wall motion -no evidence of ischemia   Cath 03/04/2019 Prox LAD to Mid LAD lesion is 10% stenosed. Prox Cx to Mid Cx lesion is 20% stenosed. 1st Mrg lesion is 20% stenosed. Prox RCA to Mid RCA lesion is 10% stenosed. The left ventricular systolic function is normal. LV end diastolic pressure is normal. The left ventricular ejection fraction is 55-65% by visual estimate.   1. Minor  nonobstructive CAD 2. Normal LV function 3. Normal LVEDP     Wynonia Musty [facility] Short Stay Center/Anesthesiology Phone [phone] 08/21/2021 11:01 AM

## 2021-08-21 NOTE — Progress Notes (Signed)
patient's daughter voiced underswtanding of new arrival time of 0930 and new ERAS time of 0830. Stated she will inform patient.

## 2021-08-22 ENCOUNTER — Other Ambulatory Visit: Payer: Self-pay

## 2021-08-22 ENCOUNTER — Encounter (HOSPITAL_COMMUNITY)
Admission: RE | Disposition: A | Discharge status: Home / Self Care | Payer: Self-pay | Source: Home / Self Care | Attending: Surgery

## 2021-08-22 ENCOUNTER — Telehealth: Payer: Self-pay | Admitting: *Deleted

## 2021-08-22 ENCOUNTER — Ambulatory Visit (HOSPITAL_COMMUNITY): Payer: Medicare HMO | Admitting: Physician Assistant

## 2021-08-22 ENCOUNTER — Telehealth (HOSPITAL_BASED_OUTPATIENT_CLINIC_OR_DEPARTMENT_OTHER): Payer: Self-pay | Admitting: Cardiology

## 2021-08-22 ENCOUNTER — Encounter (HOSPITAL_COMMUNITY): Payer: Self-pay | Admitting: Surgery

## 2021-08-22 ENCOUNTER — Ambulatory Visit (HOSPITAL_COMMUNITY): Payer: Medicare HMO | Admitting: Anesthesiology

## 2021-08-22 ENCOUNTER — Inpatient Hospital Stay (HOSPITAL_COMMUNITY)
Admission: RE | Admit: 2021-08-22 | Discharge: 2021-08-25 | DRG: 982 | Disposition: A | Discharge status: Home / Self Care | Payer: Medicare HMO | Attending: Surgery | Admitting: Surgery

## 2021-08-22 DIAGNOSIS — R509 Fever, unspecified: Secondary | ICD-10-CM

## 2021-08-22 DIAGNOSIS — I120 Hypertensive chronic kidney disease with stage 5 chronic kidney disease or end stage renal disease: Secondary | ICD-10-CM

## 2021-08-22 DIAGNOSIS — Z794 Long term (current) use of insulin: Secondary | ICD-10-CM

## 2021-08-22 DIAGNOSIS — E11319 Type 2 diabetes mellitus with unspecified diabetic retinopathy without macular edema: Secondary | ICD-10-CM | POA: Diagnosis present

## 2021-08-22 DIAGNOSIS — Z853 Personal history of malignant neoplasm of breast: Secondary | ICD-10-CM

## 2021-08-22 DIAGNOSIS — Z79624 Long term (current) use of inhibitors of nucleotide synthesis: Secondary | ICD-10-CM

## 2021-08-22 DIAGNOSIS — Z8249 Family history of ischemic heart disease and other diseases of the circulatory system: Secondary | ICD-10-CM

## 2021-08-22 DIAGNOSIS — T8619 Other complication of kidney transplant: Principal | ICD-10-CM | POA: Diagnosis present

## 2021-08-22 DIAGNOSIS — Z1501 Genetic susceptibility to malignant neoplasm of breast: Secondary | ICD-10-CM | POA: Diagnosis present

## 2021-08-22 DIAGNOSIS — Z833 Family history of diabetes mellitus: Secondary | ICD-10-CM

## 2021-08-22 DIAGNOSIS — Z7982 Long term (current) use of aspirin: Secondary | ICD-10-CM

## 2021-08-22 DIAGNOSIS — I2510 Atherosclerotic heart disease of native coronary artery without angina pectoris: Secondary | ICD-10-CM | POA: Diagnosis not present

## 2021-08-22 DIAGNOSIS — I10 Essential (primary) hypertension: Secondary | ICD-10-CM | POA: Diagnosis present

## 2021-08-22 DIAGNOSIS — N179 Acute kidney failure, unspecified: Secondary | ICD-10-CM | POA: Diagnosis present

## 2021-08-22 DIAGNOSIS — C50312 Malignant neoplasm of lower-inner quadrant of left female breast: Secondary | ICD-10-CM | POA: Diagnosis present

## 2021-08-22 DIAGNOSIS — N1831 Chronic kidney disease, stage 3a: Secondary | ICD-10-CM | POA: Diagnosis present

## 2021-08-22 DIAGNOSIS — I129 Hypertensive chronic kidney disease with stage 1 through stage 4 chronic kidney disease, or unspecified chronic kidney disease: Secondary | ICD-10-CM | POA: Diagnosis present

## 2021-08-22 DIAGNOSIS — E119 Type 2 diabetes mellitus without complications: Secondary | ICD-10-CM

## 2021-08-22 DIAGNOSIS — E1122 Type 2 diabetes mellitus with diabetic chronic kidney disease: Secondary | ICD-10-CM | POA: Diagnosis present

## 2021-08-22 DIAGNOSIS — E1142 Type 2 diabetes mellitus with diabetic polyneuropathy: Secondary | ICD-10-CM | POA: Diagnosis present

## 2021-08-22 DIAGNOSIS — E875 Hyperkalemia: Secondary | ICD-10-CM | POA: Diagnosis present

## 2021-08-22 DIAGNOSIS — Z7952 Long term (current) use of systemic steroids: Secondary | ICD-10-CM

## 2021-08-22 DIAGNOSIS — Y830 Surgical operation with transplant of whole organ as the cause of abnormal reaction of the patient, or of later complication, without mention of misadventure at the time of the procedure: Secondary | ICD-10-CM | POA: Diagnosis present

## 2021-08-22 DIAGNOSIS — Z170 Estrogen receptor positive status [ER+]: Secondary | ICD-10-CM

## 2021-08-22 DIAGNOSIS — Z20822 Contact with and (suspected) exposure to covid-19: Secondary | ICD-10-CM | POA: Diagnosis present

## 2021-08-22 DIAGNOSIS — E11649 Type 2 diabetes mellitus with hypoglycemia without coma: Secondary | ICD-10-CM

## 2021-08-22 DIAGNOSIS — R5082 Postprocedural fever: Secondary | ICD-10-CM | POA: Diagnosis present

## 2021-08-22 DIAGNOSIS — Z4001 Encounter for prophylactic removal of breast: Secondary | ICD-10-CM

## 2021-08-22 DIAGNOSIS — Z9221 Personal history of antineoplastic chemotherapy: Secondary | ICD-10-CM

## 2021-08-22 DIAGNOSIS — Z9049 Acquired absence of other specified parts of digestive tract: Secondary | ICD-10-CM

## 2021-08-22 DIAGNOSIS — Z8041 Family history of malignant neoplasm of ovary: Secondary | ICD-10-CM

## 2021-08-22 DIAGNOSIS — Z79899 Other long term (current) drug therapy: Secondary | ICD-10-CM

## 2021-08-22 DIAGNOSIS — E785 Hyperlipidemia, unspecified: Secondary | ICD-10-CM | POA: Diagnosis present

## 2021-08-22 DIAGNOSIS — Z1509 Genetic susceptibility to other malignant neoplasm: Secondary | ICD-10-CM | POA: Diagnosis present

## 2021-08-22 DIAGNOSIS — Z90710 Acquired absence of both cervix and uterus: Secondary | ICD-10-CM

## 2021-08-22 DIAGNOSIS — K219 Gastro-esophageal reflux disease without esophagitis: Secondary | ICD-10-CM | POA: Diagnosis present

## 2021-08-22 DIAGNOSIS — N186 End stage renal disease: Secondary | ICD-10-CM

## 2021-08-22 DIAGNOSIS — Z6841 Body Mass Index (BMI) 40.0 and over, adult: Secondary | ICD-10-CM

## 2021-08-22 HISTORY — PX: SIMPLE MASTECTOMY WITH AXILLARY SENTINEL NODE BIOPSY: SHX6098

## 2021-08-22 LAB — GLUCOSE, CAPILLARY
Glucose-Capillary: 204 mg/dL — ABNORMAL HIGH (ref 70–99)
Glucose-Capillary: 214 mg/dL — ABNORMAL HIGH (ref 70–99)
Glucose-Capillary: 165 mg/dL — ABNORMAL HIGH (ref 70–99)
Glucose-Capillary: 155 mg/dL — ABNORMAL HIGH (ref 70–99)
Glucose-Capillary: 187 mg/dL — ABNORMAL HIGH (ref 70–99)
Glucose-Capillary: 201 mg/dL — ABNORMAL HIGH (ref 70–99)

## 2021-08-22 SURGERY — SIMPLE MASTECTOMY
Anesthesia: Regional | Site: Breast | Laterality: Bilateral

## 2021-08-22 MED ORDER — ACETAMINOPHEN 500 MG PO TABS
500.0000 mg | ORAL_TABLET | Freq: Four times a day (QID) | ORAL | Status: DC | PRN
  Administered 2021-08-24: 500 mg via ORAL
  Filled 2021-08-22: qty 1

## 2021-08-22 MED ORDER — FENTANYL CITRATE (PF) 100 MCG/2ML IJ SOLN
25.0000 ug | INTRAMUSCULAR | Status: DC | PRN
  Administered 2021-08-22: 50 ug via INTRAVENOUS

## 2021-08-22 MED ORDER — OXYCODONE HCL 5 MG PO TABS
5.0000 mg | ORAL_TABLET | ORAL | Status: DC | PRN
  Administered 2021-08-22 – 2021-08-23 (×2): 10 mg via ORAL
  Administered 2021-08-23: 5 mg via ORAL
  Administered 2021-08-23 – 2021-08-24 (×2): 10 mg via ORAL
  Filled 2021-08-22 (×4): qty 2

## 2021-08-22 MED ORDER — ACETAMINOPHEN 10 MG/ML IV SOLN: 1000.0000 mg | Freq: Once | INTRAVENOUS | Status: DC | PRN

## 2021-08-22 MED ORDER — PREDNISONE 5 MG PO TABS
5.0000 mg | ORAL_TABLET | Freq: Every day | ORAL | Status: DC
  Administered 2021-08-23 – 2021-08-25 (×3): 5 mg via ORAL
  Filled 2021-08-22 (×3): qty 1

## 2021-08-22 MED ORDER — LIDOCAINE 2% (20 MG/ML) 5 ML SYRINGE
INTRAMUSCULAR | Status: DC | PRN
Start: 2021-08-22 — End: 2021-08-22

## 2021-08-22 MED ORDER — INSULIN ASPART 100 UNIT/ML IJ SOLN
0.0000 [IU] | Freq: Three times a day (TID) | INTRAMUSCULAR | Status: DC
  Administered 2021-08-22 – 2021-08-23 (×2): 3 [IU] via SUBCUTANEOUS
  Administered 2021-08-23: 5 [IU] via SUBCUTANEOUS
  Administered 2021-08-23: 3 [IU] via SUBCUTANEOUS
  Administered 2021-08-24: 8 [IU] via SUBCUTANEOUS
  Administered 2021-08-24: 3 [IU] via SUBCUTANEOUS
  Administered 2021-08-24: 11 [IU] via SUBCUTANEOUS
  Administered 2021-08-25: 5 [IU] via SUBCUTANEOUS

## 2021-08-22 MED ORDER — PANTOPRAZOLE SODIUM 40 MG PO TBEC
40.0000 mg | DELAYED_RELEASE_TABLET | Freq: Two times a day (BID) | ORAL | Status: DC
  Administered 2021-08-22 – 2021-08-25 (×6): 40 mg via ORAL
  Filled 2021-08-22 (×6): qty 1

## 2021-08-22 MED ORDER — METOPROLOL TARTRATE 5 MG/5ML IV SOLN: 5.0000 mg | Freq: Four times a day (QID) | INTRAVENOUS | Status: DC | PRN

## 2021-08-22 MED ORDER — PHENYLEPHRINE HCL-NACL 20-0.9 MG/250ML-% IV SOLN
INTRAVENOUS | Status: DC | PRN
  Administered 2021-08-22: 30.267 ug/min via INTRAVENOUS

## 2021-08-22 MED ORDER — SIMETHICONE 80 MG PO CHEW
40.0000 mg | CHEWABLE_TABLET | Freq: Four times a day (QID) | ORAL | Status: DC | PRN
  Administered 2021-08-25: 40 mg via ORAL
  Filled 2021-08-22 (×2): qty 1

## 2021-08-22 MED ORDER — FENTANYL CITRATE (PF) 100 MCG/2ML IJ SOLN
INTRAMUSCULAR | Status: AC
  Filled 2021-08-22: qty 2

## 2021-08-22 MED ORDER — ACETAMINOPHEN 160 MG/5ML PO SOLN: 1000.0000 mg | Freq: Once | ORAL | Status: DC | PRN

## 2021-08-22 MED ORDER — OXYCODONE HCL 5 MG PO TABS
ORAL_TABLET | ORAL | Status: AC
  Filled 2021-08-22: qty 2

## 2021-08-22 MED ORDER — CHLORHEXIDINE GLUCONATE CLOTH 2 % EX PADS: 6.0000 | MEDICATED_PAD | Freq: Once | CUTANEOUS | Status: DC

## 2021-08-22 MED ORDER — FENTANYL CITRATE (PF) 100 MCG/2ML IJ SOLN: 100.0000 ug | Freq: Once | INTRAMUSCULAR | Status: AC

## 2021-08-22 MED ORDER — ESCITALOPRAM OXALATE 10 MG PO TABS
10.0000 mg | ORAL_TABLET | Freq: Every day | ORAL | Status: DC
Start: 2021-08-23 — End: 2021-08-25
  Administered 2021-08-23 – 2021-08-25 (×3): 10 mg via ORAL
  Filled 2021-08-22 (×3): qty 1

## 2021-08-22 MED ORDER — EXEMESTANE 25 MG PO TABS
25.0000 mg | ORAL_TABLET | Freq: Every day | ORAL | Status: DC
  Administered 2021-08-23 – 2021-08-25 (×3): 25 mg via ORAL
  Filled 2021-08-22 (×3): qty 1

## 2021-08-22 MED ORDER — OXYCODONE HCL 5 MG/5ML PO SOLN: 5.0000 mg | Freq: Once | ORAL | Status: DC | PRN

## 2021-08-22 MED ORDER — PROPOFOL 500 MG/50ML IV EMUL
INTRAVENOUS | Status: DC | PRN
  Administered 2021-08-22: 125 ug/kg/min via INTRAVENOUS

## 2021-08-22 MED ORDER — BUPIVACAINE-EPINEPHRINE (PF) 0.5% -1:200000 IJ SOLN
INTRAMUSCULAR | Status: DC | PRN
  Administered 2021-08-22 (×2): 20 mL via PERINEURAL

## 2021-08-22 MED ORDER — LIDOCAINE-EPINEPHRINE (PF) 2 %-1:200000 IJ SOLN
INTRAMUSCULAR | Status: DC | PRN
  Administered 2021-08-22 (×2): 8 mL via PERINEURAL

## 2021-08-22 MED ORDER — ACETAMINOPHEN 10 MG/ML IV SOLN
INTRAVENOUS | Status: AC
  Filled 2021-08-22: qty 100

## 2021-08-22 MED ORDER — ZOLPIDEM TARTRATE 5 MG PO TABS
5.0000 mg | ORAL_TABLET | Freq: Every evening | ORAL | Status: DC | PRN
  Administered 2021-08-25: 5 mg via ORAL
  Filled 2021-08-22 (×2): qty 1

## 2021-08-22 MED ORDER — CEFAZOLIN SODIUM-DEXTROSE 2-4 GM/100ML-% IV SOLN
2.0000 g | INTRAVENOUS | Status: AC
  Administered 2021-08-22: 2 g via INTRAVENOUS
  Filled 2021-08-22: qty 100

## 2021-08-22 MED ORDER — BACITRACIN-POLYMYXIN B 500-10000 UNIT/GM OP OINT
TOPICAL_OINTMENT | OPHTHALMIC | Status: DC | PRN
  Administered 2021-08-22: 1

## 2021-08-22 MED ORDER — ACETAMINOPHEN 10 MG/ML IV SOLN
INTRAVENOUS | Status: DC | PRN
  Administered 2021-08-22: 1000 mg via INTRAVENOUS

## 2021-08-22 MED ORDER — ONDANSETRON 4 MG PO TBDP
4.0000 mg | ORAL_TABLET | Freq: Four times a day (QID) | ORAL | Status: DC | PRN
Start: 2021-08-22 — End: 2021-08-25

## 2021-08-22 MED ORDER — MIDAZOLAM HCL 2 MG/2ML IJ SOLN: 2.0000 mg | Freq: Once | INTRAMUSCULAR | Status: AC

## 2021-08-22 MED ORDER — HYDROMORPHONE HCL 1 MG/ML IJ SOLN
1.0000 mg | INTRAMUSCULAR | Status: DC | PRN
  Administered 2021-08-22 – 2021-08-23 (×2): 1 mg via INTRAVENOUS
  Filled 2021-08-22 (×3): qty 1

## 2021-08-22 MED ORDER — FENTANYL CITRATE (PF) 250 MCG/5ML IJ SOLN
INTRAMUSCULAR | Status: DC | PRN
  Administered 2021-08-22 (×4): 25 ug via INTRAVENOUS

## 2021-08-22 MED ORDER — MYCOPHENOLATE SODIUM 180 MG PO TBEC
360.0000 mg | DELAYED_RELEASE_TABLET | Freq: Two times a day (BID) | ORAL | Status: DC
  Administered 2021-08-22 – 2021-08-25 (×6): 360 mg via ORAL
  Filled 2021-08-22 (×6): qty 2

## 2021-08-22 MED ORDER — PHENYLEPHRINE 40 MCG/ML (10ML) SYRINGE FOR IV PUSH (FOR BLOOD PRESSURE SUPPORT)
PREFILLED_SYRINGE | INTRAVENOUS | Status: DC | PRN
  Administered 2021-08-22 (×2): 80 ug via INTRAVENOUS
  Administered 2021-08-22: 40 ug via INTRAVENOUS
  Administered 2021-08-22: 80 ug via INTRAVENOUS
  Administered 2021-08-22: 40 ug via INTRAVENOUS
  Administered 2021-08-22 (×2): 80 ug via INTRAVENOUS

## 2021-08-22 MED ORDER — GABAPENTIN 300 MG PO CAPS
300.0000 mg | ORAL_CAPSULE | Freq: Two times a day (BID) | ORAL | Status: DC
  Administered 2021-08-22 (×2): 300 mg via ORAL
  Filled 2021-08-22 (×3): qty 1

## 2021-08-22 MED ORDER — EPHEDRINE SULFATE-NACL 50-0.9 MG/10ML-% IV SOSY
PREFILLED_SYRINGE | INTRAVENOUS | Status: DC | PRN
Start: 2021-08-22 — End: 2021-08-22
  Administered 2021-08-22: 5 mg via INTRAVENOUS

## 2021-08-22 MED ORDER — MIDAZOLAM HCL 2 MG/2ML IJ SOLN
INTRAMUSCULAR | Status: AC
  Administered 2021-08-22: 2 mg via INTRAVENOUS
  Filled 2021-08-22: qty 2

## 2021-08-22 MED ORDER — ROSUVASTATIN CALCIUM 5 MG PO TABS
5.0000 mg | ORAL_TABLET | Freq: Every day | ORAL | Status: DC
  Administered 2021-08-22 – 2021-08-24 (×3): 5 mg via ORAL
  Filled 2021-08-22 (×3): qty 1

## 2021-08-22 MED ORDER — INSULIN ASPART 100 UNIT/ML IJ SOLN
0.0000 [IU] | INTRAMUSCULAR | Status: DC | PRN
  Administered 2021-08-22: 2 [IU] via SUBCUTANEOUS
  Filled 2021-08-22: qty 1

## 2021-08-22 MED ORDER — FUROSEMIDE 40 MG PO TABS: 40.0000 mg | ORAL_TABLET | Freq: Two times a day (BID) | ORAL | Status: DC | PRN

## 2021-08-22 MED ORDER — FENTANYL CITRATE (PF) 100 MCG/2ML IJ SOLN
INTRAMUSCULAR | Status: AC
  Administered 2021-08-22: 100 ug via INTRAVENOUS
  Filled 2021-08-22: qty 2

## 2021-08-22 MED ORDER — ENOXAPARIN SODIUM 40 MG/0.4ML IJ SOSY
40.0000 mg | PREFILLED_SYRINGE | INTRAMUSCULAR | Status: DC
  Administered 2021-08-23 – 2021-08-25 (×3): 40 mg via SUBCUTANEOUS
  Filled 2021-08-22 (×3): qty 0.4

## 2021-08-22 MED ORDER — CHLORHEXIDINE GLUCONATE 0.12 % MT SOLN
15.0000 mL | Freq: Once | OROMUCOSAL | Status: AC
  Administered 2021-08-22: 15 mL via OROMUCOSAL
  Filled 2021-08-22: qty 15

## 2021-08-22 MED ORDER — TACROLIMUS 1 MG PO CAPS
3.0000 mg | ORAL_CAPSULE | Freq: Every day | ORAL | Status: DC
  Administered 2021-08-23 – 2021-08-25 (×3): 3 mg via ORAL
  Filled 2021-08-22 (×3): qty 3

## 2021-08-22 MED ORDER — CARVEDILOL 25 MG PO TABS
25.0000 mg | ORAL_TABLET | Freq: Two times a day (BID) | ORAL | Status: DC
  Administered 2021-08-22 – 2021-08-25 (×6): 25 mg via ORAL
  Filled 2021-08-22 (×6): qty 1

## 2021-08-22 MED ORDER — ONDANSETRON HCL 4 MG/2ML IJ SOLN
INTRAMUSCULAR | Status: DC | PRN
  Administered 2021-08-22: 4 mg via INTRAVENOUS

## 2021-08-22 MED ORDER — SODIUM CHLORIDE 0.9 % IV SOLN: INTRAVENOUS | Status: DC

## 2021-08-22 MED ORDER — FENTANYL CITRATE (PF) 250 MCG/5ML IJ SOLN
INTRAMUSCULAR | Status: AC
  Filled 2021-08-22: qty 5

## 2021-08-22 MED ORDER — HEMOSTATIC AGENTS (NO CHARGE) OPTIME
TOPICAL | Status: DC | PRN
  Administered 2021-08-22: 3 via TOPICAL

## 2021-08-22 MED ORDER — LACTATED RINGERS IV SOLN: INTRAVENOUS | Status: DC

## 2021-08-22 MED ORDER — ORAL CARE MOUTH RINSE: 15.0000 mL | Freq: Once | OROMUCOSAL | Status: AC

## 2021-08-22 MED ORDER — PROPOFOL 10 MG/ML IV BOLUS
INTRAVENOUS | Status: AC
  Filled 2021-08-22: qty 20

## 2021-08-22 MED ORDER — ACETAMINOPHEN 500 MG PO TABS: 1000.0000 mg | ORAL_TABLET | Freq: Once | ORAL | Status: DC | PRN

## 2021-08-22 MED ORDER — POLYMYXIN B SULFATE 500000 UNITS IJ SOLR
INTRAMUSCULAR | Status: AC
  Filled 2021-08-22: qty 10

## 2021-08-22 MED ORDER — ONDANSETRON HCL 4 MG/2ML IJ SOLN: 4.0000 mg | Freq: Four times a day (QID) | INTRAMUSCULAR | Status: DC | PRN

## 2021-08-22 MED ORDER — MIDAZOLAM HCL 2 MG/2ML IJ SOLN
INTRAMUSCULAR | Status: AC
  Filled 2021-08-22: qty 2

## 2021-08-22 MED ORDER — PROPOFOL 10 MG/ML IV BOLUS
INTRAVENOUS | Status: DC | PRN
  Administered 2021-08-22: 200 mg via INTRAVENOUS

## 2021-08-22 MED ORDER — LORATADINE 10 MG PO TABS
10.0000 mg | ORAL_TABLET | Freq: Every day | ORAL | Status: DC
  Administered 2021-08-22 – 2021-08-25 (×4): 10 mg via ORAL
  Filled 2021-08-22 (×4): qty 1

## 2021-08-22 MED ORDER — 0.9 % SODIUM CHLORIDE (POUR BTL) OPTIME
TOPICAL | Status: DC | PRN
  Administered 2021-08-22: 1000 mL

## 2021-08-22 MED ORDER — CEFAZOLIN SODIUM-DEXTROSE 2-4 GM/100ML-% IV SOLN
2.0000 g | Freq: Three times a day (TID) | INTRAVENOUS | Status: AC
  Administered 2021-08-22: 2 g via INTRAVENOUS
  Filled 2021-08-22: qty 100

## 2021-08-22 MED ORDER — OXYCODONE HCL 5 MG PO TABS: 5.0000 mg | ORAL_TABLET | Freq: Once | ORAL | Status: DC | PRN

## 2021-08-22 SURGICAL SUPPLY — 41 items
ADH SKN CLS APL DERMABOND .7 (GAUZE/BANDAGES/DRESSINGS) ×2
APL PRP STRL LF DISP 70% ISPRP (MISCELLANEOUS) ×1
APPLIER CLIP 9.375 MED OPEN (MISCELLANEOUS) ×2
APR CLP MED 9.3 20 MLT OPN (MISCELLANEOUS) ×1
BAG COUNTER SPONGE SURGICOUNT (BAG) ×2 IMPLANT
BAG SPNG CNTER NS LX DISP (BAG) ×1
BINDER BREAST XLRG (GAUZE/BANDAGES/DRESSINGS) ×1 IMPLANT
BIOPATCH WHT 1IN DISK W/4.0 H (GAUZE/BANDAGES/DRESSINGS) ×2 IMPLANT
CANISTER SUCT 3000ML PPV (MISCELLANEOUS) ×2 IMPLANT
CHLORAPREP W/TINT 26 (MISCELLANEOUS) ×2 IMPLANT
CLIP APPLIE 9.375 MED OPEN (MISCELLANEOUS) ×1 IMPLANT
COVER SURGICAL LIGHT HANDLE (MISCELLANEOUS) ×2 IMPLANT
DERMABOND ADVANCED (GAUZE/BANDAGES/DRESSINGS) ×2
DERMABOND ADVANCED .7 DNX12 (GAUZE/BANDAGES/DRESSINGS) ×1 IMPLANT
DRAIN CHANNEL 19F RND (DRAIN) ×3 IMPLANT
DRAPE LAPAROSCOPIC ABDOMINAL (DRAPES) ×2 IMPLANT
ELECT REM PT RETURN 9FT ADLT (ELECTROSURGICAL) ×2
ELECTRODE REM PT RTRN 9FT ADLT (ELECTROSURGICAL) ×1 IMPLANT
EVACUATOR SILICONE 100CC (DRAIN) ×3 IMPLANT
GAUZE SPONGE 4X4 12PLY STRL (GAUZE/BANDAGES/DRESSINGS) ×2 IMPLANT
GLOVE SRG 8 PF TXTR STRL LF DI (GLOVE) ×1 IMPLANT
GLOVE SURG ENC MOIS LTX SZ8 (GLOVE) ×2 IMPLANT
GLOVE SURG UNDER POLY LF SZ8 (GLOVE) ×2
GOWN STRL REUS W/ TWL LRG LVL3 (GOWN DISPOSABLE) ×2 IMPLANT
GOWN STRL REUS W/ TWL XL LVL3 (GOWN DISPOSABLE) ×1 IMPLANT
GOWN STRL REUS W/TWL LRG LVL3 (GOWN DISPOSABLE) ×4
GOWN STRL REUS W/TWL XL LVL3 (GOWN DISPOSABLE) ×2
HEMOSTAT ARISTA ABSORB 3G PWDR (HEMOSTASIS) ×6 IMPLANT
KIT BASIN OR (CUSTOM PROCEDURE TRAY) ×2 IMPLANT
KIT TURNOVER KIT B (KITS) ×2 IMPLANT
NS IRRIG 1000ML POUR BTL (IV SOLUTION) ×2 IMPLANT
PACK GENERAL/GYN (CUSTOM PROCEDURE TRAY) ×2 IMPLANT
PAD ARMBOARD 7.5X6 YLW CONV (MISCELLANEOUS) ×2 IMPLANT
PENCIL SMOKE EVACUATOR (MISCELLANEOUS) ×2 IMPLANT
SPECIMEN JAR X LARGE (MISCELLANEOUS) ×3 IMPLANT
SUT ETHILON 3 0 FSL (SUTURE) ×2 IMPLANT
SUT MNCRL AB 4-0 PS2 18 (SUTURE) ×2 IMPLANT
SUT SILK 3 0 SH 30 (SUTURE) ×1 IMPLANT
SUT VIC AB 3-0 SH 18 (SUTURE) ×3 IMPLANT
TOWEL GREEN STERILE (TOWEL DISPOSABLE) ×2 IMPLANT
TOWEL GREEN STERILE FF (TOWEL DISPOSABLE) ×2 IMPLANT

## 2021-08-22 NOTE — Anesthesia Procedure Notes (Signed)
Anesthesia Regional Block: Pectoralis block   Pre-Anesthetic Checklist: , timeout performed,  Correct Patient, Correct Site, Correct Laterality,  Correct Procedure, Correct Position, site marked,  Risks and benefits discussed,  Surgical consent,  Pre-op evaluation,  At surgeon's request and post-op pain management  Laterality: Right  Prep: chloraprep       Needles:  Injection technique: Single-shot      Needle Length: 9cm  Needle Gauge: 22     Additional Needles: Arrow StimuQuik ECHO Echogenic Stimulating PNB Needle  Procedures:,,,, ultrasound used (permanent image in chart),,    Narrative:  Start time: 08/22/2021 11:15 AM End time: 08/22/2021 11:21 AM Injection made incrementally with aspirations every 5 mL.  Performed by: Personally  Anesthesiologist: Oleta Mouse, MD

## 2021-08-22 NOTE — Op Note (Signed)
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  She is BRCA2 and is elected to undergo a risk reducing bilateral simple mastectomies.  She was not a candidate for immediate reconstruction.  Risk and benefits discussed with the patient.  Rationale for surgery discussed.  She agrees to proceed.The surgical and non surgical options have been discussed with the patient.  Risks of surgery include bleeding,  Infection,  Flap necrosis,  Tissue loss,  Chronic pain, death, Numbness,  And the need for additional procedures.  Reconstruction options also have been discussed with the patient as well.  The patient agrees to proceed.   Description of procedure: The patient was met in the holding and questions were answered.  She was then taken back to the operating.  She was placed upon the OR table.  After induction general esthesia both breasts were prepped draped in sterile fashion timeout performed.  Proper patient, site and procedure verified.  The right side was done initially.  Curvilinear incisions were made above and below the nipple-areolar complex.  There is excessive skin that was also included in this large ellipse.  Superior skin flap taken to the superior portion of the clavicle.  Inferior skin flap taken down to the inframammary fold.  The breast was then dissected in a medial lateral fashion of the chest wall until lateral attachments were encountered and divided.  These were then passed off the field to orientation.  Hemostasis achieved.  Left mastectomy done.  Curvilinear incision made above and below the nipple areolar complex.  Superior  skin flap taken of the clavicle and inferior skin flap taken to the inframammary fold.  The left breast was then dissected in a medial to lateral fashion removing the breast off the chest wall.  Hemostasis achieved with cautery as well as Arista on both sides.  Prior to this irrigation was used.   19 round drain placed on both sides.  Wounds were then closed with interrupted 3-0 Vicryl pops.  4 Monocryl was used to close the skin on both sides.  Dermabond applied.  All counts found to be correct.  Patient was then awoke extubated taken recovery in satisfactory condition.

## 2021-08-22 NOTE — Discharge Instructions (Signed)
[facility] [phone]  MASTECTOMY: POST OP INSTRUCTIONS  Always review your discharge instruction sheet given to you by the facility where your surgery was performed. IF YOU HAVE DISABILITY OR FAMILY LEAVE FORMS, YOU MUST BRING THEM TO THE OFFICE FOR PROCESSING.   DO NOT GIVE THEM TO YOUR DOCTOR. A prescription for pain medication may be given to you upon discharge.  Take your pain medication as prescribed, if needed.  If narcotic pain medicine is not needed, then you may take acetaminophen (Tylenol) or ibuprofen (Advil) as needed. Take your usually prescribed medications unless otherwise directed. If you need a refill on your pain medication, please contact your pharmacy.  They will contact our office to request authorization.  Prescriptions will not be filled after 5pm or on week-ends. You should follow a light diet the first few days after arrival home, such as soup and crackers, etc.  Resume your normal diet the day after surgery. Most patients will experience some swelling and bruising on the chest and underarm.  Ice packs will help.  Swelling and bruising can take several days to resolve.  It is common to experience some constipation if taking pain medication after surgery.  Increasing fluid intake and taking a stool softener (such as Colace) will usually help or prevent this problem from occurring.  A mild laxative (Milk of Magnesia or Miralax) should be taken according to package instructions if there are no bowel movements after 48 hours. Unless discharge instructions indicate otherwise, leave your bandage dry and in place until your next appointment in 3-5 days.  You may take a limited sponge bath.  No tube baths or showers until the drains are removed.  You may have steri-strips (small skin tapes) in place directly over the incision.  These strips should be left on the skin for 7-10 days.  If your surgeon used skin glue on the incision, you may shower in 24 hours.   The glue will flake off over the next 2-3 weeks.  Any sutures or staples will be removed at the office during your follow-up visit. DRAINS:  If you have drains in place, it is important to keep a list of the amount of drainage produced each day in your drains.  Before leaving the hospital, you should be instructed on drain care.  Call our office if you have any questions about your drains. ACTIVITIES:  You may resume regular (light) daily activities beginning the next day--such as daily self-care, walking, climbing stairs--gradually increasing activities as tolerated.  You may have sexual intercourse when it is comfortable.  Refrain from any heavy lifting or straining until approved by your doctor. You may drive when you are no longer taking prescription pain medication, you can comfortably wear a seatbelt, and you can safely maneuver your car and apply brakes. RETURN TO WORK:  __________________________________________________________ You should see your doctor in the office for a follow-up appointment approximately 3-5 days after your surgery.  Your doctor's nurse will typically make your follow-up appointment when she calls you with your pathology report.  Expect your pathology report 2-3 business days after your surgery.  You may call to check if you do not hear from us after three days.   OTHER INSTRUCTIONS: ______________________________________________________________________________________________ ____________________________________________________________________________________________ WHEN TO CALL YOUR DOCTOR: Fever over 101.0 Nausea and/or vomiting Extreme swelling or bruising Continued bleeding from incision. Increased pain, redness, or drainage from the incision. The clinic staff is available to answer your questions during regular business hours.  Please don't hesitate   to call and ask to speak to one of the nurses for clinical concerns.  If you have a medical emergency, go to the  nearest emergency room or call 911.  A surgeon from [facility] is always on call at the hospital. [address], [address], [place], [place]  [address] ? [address] [address], [place], [place]   [address] [phone] ? [phone] ? FAX [phone] Web site: [email or web address]  

## 2021-08-22 NOTE — Interval H&P Note (Signed)
History and Physical Interval Note:  08/22/2021 11:01 AM  Cynthia [name]  has presented today for surgery, with the diagnosis of BRCA 2.  The various methods of treatment have been discussed with the patient and family. After consideration of risks, benefits and other options for treatment, the patient has consented to  Procedure(s): BILATERAL SIMPLE MASTECTOMY (Bilateral) as a surgical intervention.  The patient's history has been reviewed, patient examined, no change in status, stable for surgery.  I have reviewed the patient's chart and labs.  Questions were answered to the patient's satisfaction.     [place]

## 2021-08-22 NOTE — Telephone Encounter (Signed)
Per Dr Berline Lopes, fax records and surgical optimization to the patient's PCP and cardiology

## 2021-08-22 NOTE — Transfer of Care (Signed)
Immediate Anesthesia Transfer of Care Note  Patient: Tenlee Wollin  Procedure(s) Performed: BILATERAL SIMPLE MASTECTOMY (Bilateral: Breast)  Patient Location: PACU  Anesthesia Type:GA combined with regional for post-op pain  Level of Consciousness: drowsy  Airway & Oxygen Therapy: Patient Spontanous Breathing and Patient connected to face mask oxygen  Post-op Assessment: Report given to RN and Post -op Vital signs reviewed and stable  Post vital signs: Reviewed and stable  Last Vitals:  Vitals Value Taken Time  BP 106/54 08/22/21 1421  Temp    Pulse 71 08/22/21 1428  Resp 9 08/22/21 1429  SpO2 97 % 08/22/21 1428  Vitals shown include unvalidated device data.  Last Pain:  Vitals:   08/22/21 1015  TempSrc:   PainSc: 0-No pain      Patients Stated Pain Goal: 0 (67/01/41 0301)  Complications: No notable events documented.

## 2021-08-22 NOTE — Anesthesia Procedure Notes (Signed)
Procedure Name: LMA Insertion Date/Time: 08/22/2021 11:48 AM Performed by: Hedwig Morton, CRNA Pre-anesthesia Checklist: Patient identified, Emergency Drugs available, Suction available and Patient being monitored Patient Re-evaluated:Patient Re-evaluated prior to induction Oxygen Delivery Method: Circle System Utilized Preoxygenation: Pre-oxygenation with 100% oxygen Induction Type: IV induction Ventilation: Mask ventilation without difficulty LMA: LMA inserted LMA Size: 4.0 Number of attempts: 1 Airway Equipment and Method: Bite block Placement Confirmation: positive ETCO2 Tube secured with: Tape Dental Injury: Teeth and Oropharynx as per pre-operative assessment

## 2021-08-22 NOTE — Anesthesia Procedure Notes (Signed)
Anesthesia Regional Block: Pectoralis block   Pre-Anesthetic Checklist: , timeout performed,  Correct Patient, Correct Site, Correct Laterality,  Correct Procedure, Correct Position, site marked,  Risks and benefits discussed,  Surgical consent,  Pre-op evaluation,  At surgeon's request and post-op pain management  Laterality: Left  Prep: chloraprep       Needles:  Injection technique: Single-shot      Needle Length: 9cm  Needle Gauge: 22     Additional Needles: Arrow StimuQuik ECHO Echogenic Stimulating PNB Needle  Procedures:,,,, ultrasound used (permanent image in chart),,    Narrative:  Start time: 08/22/2021 11:09 AM End time: 08/22/2021 11:15 AM Injection made incrementally with aspirations every 5 mL.  Performed by: Personally  Anesthesiologist: Oleta Mouse, MD

## 2021-08-22 NOTE — Telephone Encounter (Signed)
° °  Pre-operative Risk Assessment    Patient Name: Cynthia [name]  DOB: 04-30-1971 MRN: [id number]      Request for Surgical Clearance    Procedure:   Robotic Assisted Bilateral Salpingo-oophorectomy , possible staging, possible laparotomy  Date of Surgery:  Clearance TBD --6 weeks after Bilateral Mastectomy (08/22/21); possibly 10/17/21                                Surgeon:  Dr. Jeral Pinch Surgeon's Group or Practice Name:  [facility] Phone number:  [phone] Fax number:  [phone]   Type of Clearance Requested:   - Medical   Need to hold ASA?---not specified on paper clearance request   Type of Anesthesia:  General    Additional requests/questions:   None  Barbaraann Faster   08/22/2021, 4:17 PM

## 2021-08-22 NOTE — H&P (Signed)
Chief Complaint: Follow-up   History of Present Illness: Cynthia [name] is a 51 y.o. female who is seen today for breast cancer follow-up. She underwent treatment of her stage I left breast cancer in October 2022 with breast conserving surgery followed by chemotherapy. She has not had radiation therapy. She did not desire mastectomy with her BRCA2 initial diagnosis and wished to proceed with breast conserving surgery. She has discussed this further with her medical oncologist she would like to pursue risk reducing surgery. She does have multiple medical problems including dialysis, diabetes coronary artery disease. We discussed this with the assistance of a translator today. She has done well from her treatments. .    Review of Systems: A complete review of systems was obtained from the patient. I have reviewed this information and discussed as appropriate with the patient. See HPI as well for other ROS.  ROS   Medical History: Past Medical History:  Diagnosis Date   Diabetes mellitus type 2, uncomplicated (CMS-HCC)  Diagnosed age 50 years old   Encounter for blood transfusion   End stage renal disease on dialysis (CMS-HCC)   Hypertension 2010   Neuropathy   Urinary tract infection  Initially with dialysis but improved   Patient Active Problem List  Diagnosis   End stage renal disease on dialysis (CMS-HCC)   Hypertension   Diabetes mellitus type 2, uncomplicated (CMS-HCC)   Neuropathy (CMS-HCC)   S/P kidney transplant   Immunosuppression (CMS-HCC)   Need for prophylactic antibiotic   History of depression   Delayed graft function of kidney   Red blood cell antibody positive   Anemia, unspecified   Acute UTI   Abdominal pain, right lower quadrant   First degree hemorrhoids   Chest pain, rule out acute myocardial infarction   Malignant neoplasm of lower-inner quadrant of left breast in female, estrogen receptor positive (CMS-HCC)   Diabetic nephropathy (CMS-HCC)   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 Schmitz/ Vikraman-Sushama   AV Fistula Left  upper   LAPAROSCOPIC TUBAL LIGATION    Allergies  Allergen Reactions   Adhesive Rash  Burns skin.  Please use paper tape only Burns skin.  Please use paper tape only   Adhesive Tape-Silicones Other (See Comments)  Burns skin. Please use paper tape only   Current Outpatient Medications on File Prior to Visit  Medication Sig Dispense Refill   amLODIPine (NORVASC) 10 MG tablet Take 1 tablet (10 mg total) by mouth once daily 60 tablet 11   aspirin 81 MG EC tablet Take 1 tablet (81 mg total) by mouth once daily. 120 tablet 0   blood pressure monitor Kit Use 1 each as directed Check BP twice daily --hypertension post renal transplant, ICD-10 code is I10 1 each 0   cetirizine (ZYRTEC) 10 mg capsule Take 10 mg by mouth once daily.   cholecalciferol (VITAMIN D3) 1,000 unit capsule Take 1,000 Units by mouth once daily   cyanocobalamin (VITAMIN B12) 1000 MCG tablet Take 1,000 mcg by mouth once daily.   docusate (COLACE) 100 MG capsule Take 100 mg by mouth 2 (  two) times daily.   ENVARSUS XR 1 mg extended-release tablet Take 3 tablets (3 mg total) by mouth every morning before breakfast 180 tablet 3   FLUoxetine (PROZAC) 20 MG tablet Take 1 tablet (20 mg total) by mouth once daily. 30 tablet 10   FREESTYLE LANCETS lancets Use 1 each 4 (four) times daily 400 each 3   FREESTYLE LITE STRIPS test strip Use 4 (four) times daily Use as instructed to  test blood sugar 400 each 3   insulin GLARGINE (LANTUS SOLOSTAR, BASAGLAR KWIKPEN) pen injector (concentration 100 units/mL) Inject 16 Units subcutaneously nightly (Patient taking differently: Inject 12 Units subcutaneously at bedtime) 15 mL 11   insulin LISPRO (HUMALOG) injection (concentration 100 units/mL) Inject 7 Units subcutaneously 3 (three) times daily with meals (Patient taking differently: Inject 5 Units subcutaneously 3 (three) times daily with meals 5 units each meal) 10 mL 11   magnesium oxide (MAG-OX) 400 mg (241.3 mg magnesium) tablet TAKE 1 TABLET(400 MG) BY MOUTH TWICE DAILY 60 tablet 11   mycophenolate (MYFORTIC) 180 MG DR tablet Take 2 tablets (360 mg total) by mouth every 12 (twelve) hours 120 tablet 1   pantoprazole (PROTONIX) 40 MG DR tablet Take 1 tablet (40 mg total) by mouth 2 (two) times daily 60 tablet 11   pen needle, diabetic 32 gauge x 5/32" Ndle Use 1 each nightly --E11.9 30 each 11   predniSONE (DELTASONE) 5 MG tablet Take 1 tablet (5 mg total) by mouth once daily. 30 tablet 11   rosuvastatin (CRESTOR) 5 MG tablet Take 1 tablet (5 mg total) by mouth nightly 30 tablet 11   rosuvastatin (CRESTOR) 5 MG tablet TAKE 1 TABLET(5 MG) BY MOUTH AT BEDTIME   carvediloL (COREG) 25 MG tablet Take 0.5 tablet (12.5 mg total) by mouth 2 (two) times daily with meals (Patient taking differently: Take 25 mg by mouth 2 (two) times daily with meals 1 tablet in the morning and 1 tablet in the evening) 90 tablet 3   gabapentin (NEURONTIN) 300 MG capsule Take 1 capsule (300 mg total) by mouth nightly for 90 days Patient will need appointment in order to obtain refills. (Patient taking differently: Take 300 mg by mouth nightly 2 tablets nightly) 90 capsule 30   No current facility-administered medications on file prior to visit.   Family History  Problem Relation Age of Onset   ESRD-Dialysis Mother   Diabetes type II Mother   Kidney disease Mother   Kidney disease Father   Diabetes type  II Father   Coronary Artery Disease (Blocked arteries around heart) Father   ESRD-Dialysis Father   Diabetes type II Brother   Lupus Sister   Diabetes type II Sister   Hepatitis B Brother    Social History   Tobacco Use  Smoking Status Never  Smokeless Tobacco Never    Social History   Socioeconomic History   Marital status: Married  Occupational History   Occupation: Disabled  Tobacco Use   Smoking status: Never   Smokeless tobacco: Never  Vaping Use   Vaping Use: Never used  Substance and Sexual Activity   Alcohol use: Yes   Drug use: No   Sexual activity: Not Currently  Comment: hystorectomy  Social History Narrative  Lives with two daughters; and one grand child   Objective:   There were no vitals filed for this visit.  There is no height or weight on file to calculate BMI.  Physical Exam Constitutional:  Appearance: Normal appearance.  Eyes:  Pupils: Pupils are equal, round, and reactive to light.  Cardiovascular:  Rate and Rhythm: Normal rate.  Pulmonary:  Effort: Pulmonary effort is normal.  Chest:  Breasts: Right: Normal.   Musculoskeletal:  Cervical back: Normal range of motion.  Lymphadenopathy:  Upper Body:  Right upper body: No supraclavicular or axillary adenopathy.  Left upper body: No supraclavicular or axillary adenopathy.  Skin: Comments: Left upper extremity AV fistula intact  Neurological:  Mental Status: She is alert.  Psychiatric:  Mood and Affect: Mood normal.     Assessment and Plan:   Diagnoses and all orders for this visit:  BRCA2 positive - Ambulatory Referral to Plastic Surgery    Discussion with the assistance of a Spanish interpreter discussing BRCA2 state and increased lifetime risk of a new breast cancer. She has undergone successful treatment of her left breast cancer last fall. She has not received radiation therapy since she met with her oncologist and they discussed risk reducing mastectomy. I explained  the pros and cons of this approach especially with her multiple medical problems. Certainly she is a candidate but there are some limitations given her multiple medical problems and increased operative risk. She does have an interest in reconstruction and I will refer her to plastic surgery to discuss this. Her anatomy does favor nipple preservation but this will need to be evaluated by the plastic surgeon to determine the feasibility of this approach versus a standard mastectomy with reconstruction. I discussed this at length with her and her family today with the assistance of a Spanish interpreter. We discussed risk of surgery to include but not exclusive of bleeding, infection, skin or nipple loss, lymphedema, pain, numbness, cosmetic outcome, long-term expectation, and alternatives to surgery which include yearly MRIs and continue medical management. They would like to pursue surgical treatment of this condition and we will proceed with scheduling bilateral risk reducing mastectomy with the possibility of nipple preservation.  Kennieth Francois, MD

## 2021-08-23 ENCOUNTER — Encounter: Payer: Self-pay | Admitting: Hematology

## 2021-08-23 ENCOUNTER — Inpatient Hospital Stay (HOSPITAL_COMMUNITY): Payer: Medicare HMO

## 2021-08-23 ENCOUNTER — Encounter (HOSPITAL_COMMUNITY): Payer: Self-pay | Admitting: Surgery

## 2021-08-23 DIAGNOSIS — Y830 Surgical operation with transplant of whole organ as the cause of abnormal reaction of the patient, or of later complication, without mention of misadventure at the time of the procedure: Secondary | ICD-10-CM | POA: Diagnosis present

## 2021-08-23 DIAGNOSIS — Z170 Estrogen receptor positive status [ER+]: Secondary | ICD-10-CM | POA: Diagnosis not present

## 2021-08-23 DIAGNOSIS — I129 Hypertensive chronic kidney disease with stage 1 through stage 4 chronic kidney disease, or unspecified chronic kidney disease: Secondary | ICD-10-CM | POA: Diagnosis present

## 2021-08-23 DIAGNOSIS — Z7982 Long term (current) use of aspirin: Secondary | ICD-10-CM | POA: Diagnosis not present

## 2021-08-23 DIAGNOSIS — Z7952 Long term (current) use of systemic steroids: Secondary | ICD-10-CM | POA: Diagnosis not present

## 2021-08-23 DIAGNOSIS — Z1501 Genetic susceptibility to malignant neoplasm of breast: Secondary | ICD-10-CM | POA: Diagnosis not present

## 2021-08-23 DIAGNOSIS — N1831 Chronic kidney disease, stage 3a: Secondary | ICD-10-CM | POA: Diagnosis present

## 2021-08-23 DIAGNOSIS — Z79899 Other long term (current) drug therapy: Secondary | ICD-10-CM | POA: Diagnosis not present

## 2021-08-23 DIAGNOSIS — R509 Fever, unspecified: Secondary | ICD-10-CM | POA: Diagnosis not present

## 2021-08-23 DIAGNOSIS — K219 Gastro-esophageal reflux disease without esophagitis: Secondary | ICD-10-CM | POA: Diagnosis present

## 2021-08-23 DIAGNOSIS — I2510 Atherosclerotic heart disease of native coronary artery without angina pectoris: Secondary | ICD-10-CM | POA: Diagnosis present

## 2021-08-23 DIAGNOSIS — Z833 Family history of diabetes mellitus: Secondary | ICD-10-CM | POA: Diagnosis not present

## 2021-08-23 DIAGNOSIS — T8619 Other complication of kidney transplant: Secondary | ICD-10-CM | POA: Diagnosis present

## 2021-08-23 DIAGNOSIS — N179 Acute kidney failure, unspecified: Secondary | ICD-10-CM | POA: Diagnosis present

## 2021-08-23 DIAGNOSIS — Z90710 Acquired absence of both cervix and uterus: Secondary | ICD-10-CM | POA: Diagnosis not present

## 2021-08-23 DIAGNOSIS — E11319 Type 2 diabetes mellitus with unspecified diabetic retinopathy without macular edema: Secondary | ICD-10-CM | POA: Diagnosis present

## 2021-08-23 DIAGNOSIS — Z1509 Genetic susceptibility to other malignant neoplasm: Secondary | ICD-10-CM | POA: Diagnosis not present

## 2021-08-23 DIAGNOSIS — Z8249 Family history of ischemic heart disease and other diseases of the circulatory system: Secondary | ICD-10-CM | POA: Diagnosis not present

## 2021-08-23 DIAGNOSIS — Z6841 Body Mass Index (BMI) 40.0 and over, adult: Secondary | ICD-10-CM | POA: Diagnosis not present

## 2021-08-23 DIAGNOSIS — Z794 Long term (current) use of insulin: Secondary | ICD-10-CM | POA: Diagnosis not present

## 2021-08-23 DIAGNOSIS — Z9221 Personal history of antineoplastic chemotherapy: Secondary | ICD-10-CM | POA: Diagnosis not present

## 2021-08-23 DIAGNOSIS — C50312 Malignant neoplasm of lower-inner quadrant of left female breast: Secondary | ICD-10-CM | POA: Diagnosis present

## 2021-08-23 DIAGNOSIS — Z20822 Contact with and (suspected) exposure to covid-19: Secondary | ICD-10-CM | POA: Diagnosis present

## 2021-08-23 DIAGNOSIS — E1142 Type 2 diabetes mellitus with diabetic polyneuropathy: Secondary | ICD-10-CM | POA: Diagnosis present

## 2021-08-23 DIAGNOSIS — Z9049 Acquired absence of other specified parts of digestive tract: Secondary | ICD-10-CM | POA: Diagnosis not present

## 2021-08-23 DIAGNOSIS — E1122 Type 2 diabetes mellitus with diabetic chronic kidney disease: Secondary | ICD-10-CM | POA: Diagnosis present

## 2021-08-23 LAB — GLUCOSE, CAPILLARY
Glucose-Capillary: 183 mg/dL — ABNORMAL HIGH (ref 70–99)
Glucose-Capillary: 206 mg/dL — ABNORMAL HIGH (ref 70–99)
Glucose-Capillary: 192 mg/dL — ABNORMAL HIGH (ref 70–99)
Glucose-Capillary: 165 mg/dL — ABNORMAL HIGH (ref 70–99)

## 2021-08-23 LAB — COMPREHENSIVE METABOLIC PANEL
ALT: 17 U/L (ref 0–44)
AST: 39 U/L (ref 15–41)
Albumin: 3.7 g/dL (ref 3.5–5.0)
Alkaline Phosphatase: 74 U/L (ref 38–126)
Anion gap: 10 (ref 5–15)
BUN: 48 mg/dL — ABNORMAL HIGH (ref 6–20)
CO2: 25 mmol/L (ref 22–32)
Calcium: 9.4 mg/dL (ref 8.9–10.3)
Chloride: 98 mmol/L (ref 98–111)
Creatinine, Ser: 2.59 mg/dL — ABNORMAL HIGH (ref 0.44–1.00)
GFR, Estimated: 22 mL/min — ABNORMAL LOW (ref >60)
Glucose, Bld: 175 mg/dL — ABNORMAL HIGH (ref 70–99)
Potassium: 5.8 mmol/L — ABNORMAL HIGH (ref 3.5–5.1)
Sodium: 133 mmol/L — ABNORMAL LOW (ref 135–145)
Total Bilirubin: 0.4 mg/dL (ref 0.3–1.2)
Total Protein: 5.9 g/dL — ABNORMAL LOW (ref 6.5–8.1)

## 2021-08-23 LAB — BASIC METABOLIC PANEL
Anion gap: 7 (ref 5–15)
BUN: 53 mg/dL — ABNORMAL HIGH (ref 6–20)
CO2: 24 mmol/L (ref 22–32)
Calcium: 9.2 mg/dL (ref 8.9–10.3)
Chloride: 98 mmol/L (ref 98–111)
Creatinine, Ser: 2.6 mg/dL — ABNORMAL HIGH (ref 0.44–1.00)
GFR, Estimated: 22 mL/min — ABNORMAL LOW (ref >60)
Glucose, Bld: 192 mg/dL — ABNORMAL HIGH (ref 70–99)
Potassium: 5.4 mmol/L — ABNORMAL HIGH (ref 3.5–5.1)
Sodium: 129 mmol/L — ABNORMAL LOW (ref 135–145)

## 2021-08-23 LAB — CBC
HCT: 34 % — ABNORMAL LOW (ref 36.0–46.0)
Hemoglobin: 10.9 g/dL — ABNORMAL LOW (ref 12.0–15.0)
MCH: 31.5 pg (ref 26.0–34.0)
MCHC: 32.1 g/dL (ref 30.0–36.0)
MCV: 98.3 fL (ref 80.0–100.0)
Platelets: 161 10*3/uL (ref 150–400)
RBC: 3.46 MIL/uL — ABNORMAL LOW (ref 3.87–5.11)
RDW: 13.2 % (ref 11.5–15.5)
WBC: 9.7 10*3/uL (ref 4.0–10.5)
nRBC: 0 % (ref 0.0–0.2)

## 2021-08-23 LAB — CK: Total CK: 392 U/L — ABNORMAL HIGH (ref 38–234)

## 2021-08-23 MED ORDER — ADULT MULTIVITAMIN W/MINERALS CH
1.0000 | ORAL_TABLET | Freq: Every day | ORAL | Status: DC
  Administered 2021-08-23 – 2021-08-25 (×3): 1 via ORAL
  Filled 2021-08-23 (×3): qty 1

## 2021-08-23 MED ORDER — SODIUM CHLORIDE 0.9 % IV SOLN: INTRAVENOUS | Status: DC

## 2021-08-23 MED ORDER — INSULIN GLARGINE-YFGN 100 UNIT/ML ~~LOC~~ SOLN
10.0000 [IU] | Freq: Every day | SUBCUTANEOUS | Status: DC
  Administered 2021-08-23 – 2021-08-24 (×2): 10 [IU] via SUBCUTANEOUS
  Filled 2021-08-23 (×2): qty 0.1

## 2021-08-23 MED ORDER — SODIUM ZIRCONIUM CYCLOSILICATE 10 G PO PACK
10.0000 g | PACK | Freq: Once | ORAL | Status: AC
  Administered 2021-08-23: 10 g via ORAL
  Filled 2021-08-23: qty 1

## 2021-08-23 MED ORDER — GABAPENTIN 600 MG PO TABS
300.0000 mg | ORAL_TABLET | Freq: Two times a day (BID) | ORAL | Status: DC
  Administered 2021-08-23 – 2021-08-25 (×5): 300 mg via ORAL
  Filled 2021-08-23 (×5): qty 1

## 2021-08-23 MED ORDER — GLUCERNA SHAKE PO LIQD
237.0000 mL | Freq: Three times a day (TID) | ORAL | Status: DC
  Administered 2021-08-23 – 2021-08-24 (×4): 237 mL via ORAL

## 2021-08-23 NOTE — Consult Note (Signed)
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  This morning her creatinine was increased to 2.6.  Most recently it was 1.3 approximately 5 days ago.  Potassium was 5.8 and she has received Lokelma.  It appears that the procedure was uneventful.  No clear sustained hypotension.  She has not received any NSAIDs.  Patient is in the room alone, resting but awakens.  She has had no difficulty taking transplant medications prior to the procedure.  Pain is fairly well controlled.  She describes no difficulties with urination   Creatinine (mg/dL)  Date Value  08/18/2021 1.34 (H)  08/07/2021 1.37 (H)  07/12/2021 1.57 (H)  06/27/2021 1.66 (H)  06/20/2021 1.29 (H)  05/24/2021 1.16 (H)  05/11/2021 1.25 (H)  05/02/2021 1.24 (H)  02/22/2021 1.27 (H)   Creatinine, Ser (mg/dL)  Date Value  08/23/2021 2.59 (H)  07/05/2021 0.99  07/04/2021 1.08 (H)  07/03/2021 1.07 (H)  07/02/2021 1.17 (H)  06/01/2021 1.26 (H)  05/31/2021 1.28 (H)  05/30/2021 1.36 (H)  05/29/2021 1.61 (H)  05/27/2021 1.21 (H)  ] I/Os: I/O last 3 completed shifts: In: 1000 [I.V.:800; IV Piggyback:200] Out: 25 [Blood:25] l  ROS Balance of 12 systems is negative w/ exceptions as above  PMH  Past Medical History:  Diagnosis Date   Allergy    pollen   Anemia    Anxiety    BRCA2 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 03/01/2021   Breast cancer [facility])    Cataract    surgical repair bilateral   Coronary artery disease    Depression     ESRD on hemodialysis ([place])    Home HD 5x per week- not on dialysis now had tramsplant 4/17   Family history of ovarian cancer 02/22/2021   GERD (gastroesophageal reflux disease)    Hearing loss 2017   right ear   History of blood transfusion    transfusion reaction   Hyperlipidemia    Hypertension    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 per pt-dr levy also II; Per patient 08/18/21 she is Type 2   Kidney transplant recipient    Neuromuscular disorder ([place])    NEUROPATHY   Sleep apnea    [place]  Past Surgical History:  Procedure Laterality Date   ABDOMINAL HYSTERECTOMY     BREAST EXCISIONAL BIOPSY Right 08/2015   BREAST LUMPECTOMY WITH RADIOACTIVE SEED LOCALIZATION Right 09/14/2015   Procedure: RIGHT BREAST LUMPECTOMY WITH RADIOACTIVE SEED LOCALIZATION;  Surgeon: Donnie Mesa, MD;  Location: [place];  Service: General;  Laterality: Right;   BREAST LUMPECTOMY WITH RADIOACTIVE SEED LOCALIZATION Left 03/02/2021   Procedure: LEFT BREAST SEED LUMPECTOMY LEFT SENTINEL LYMPH NODE [place];  Surgeon: Erroll Luna, MD;  Location: [place];  Service: General;  Laterality: Left;  GEN AND PEC BLOCK   BREAST SURGERY Bilateral    biopsy bilateral   CARDIAC CATHETERIZATION     CATARACT EXTRACTION Bilateral    bilateral   CHOLECYSTECTOMY     CYST REMOVAL NECK  DIALYSIS FISTULA CREATION Left    EYE SURGERY Bilateral    lazer   kidney transplant     LEFT HEART CATH AND CORONARY ANGIOGRAPHY N/A 03/04/2019   Procedure: LEFT HEART CATH AND CORONARY ANGIOGRAPHY;  Surgeon: Martinique, Peter M, MD;  Location: [place] CV LAB;  Service: Cardiovascular;  Laterality: N/A;   LEFT HEART CATHETERIZATION WITH CORONARY ANGIOGRAM N/A 03/30/2014   Procedure: LEFT HEART CATHETERIZATION WITH CORONARY ANGIOGRAM;  Surgeon: Sinclair Grooms, MD;  Location: [facility] CATH LAB;  Service: Cardiovascular;  Laterality: N/A;   LIPOMA  EXCISION N/A 02/06/2017   Procedure: EXCISION POSTERIOR NECK SEBACEOUS CYST;  Surgeon: Coralie Keens, MD;  Location: [place];  Service: General;  Laterality: N/A;   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   Procedure: REPAIR OF ARTERIOVENOUS FISTULA ANEURYSM;  Surgeon: Serafina Mitchell, MD;  Location: [facility];  Service: Vascular;  Laterality: Left;   REVISON OF ARTERIOVENOUS FISTULA Left 09/28/2013   Procedure: EXCISE ESCHAR LEFT ARM  ARTERIOVENOUS FISTULA WITH PLICATION OF LEFT ARM ARTERIOVENOUS FISTULA;  Surgeon: Elam Dutch, MD;  [facility] OR;  Service: Vascular;  Laterality: Left;   REVISON OF ARTERIOVENOUS FISTULA Left 08/26/2015   Procedure: RESECTION ANEURYSM OF LEFT ARM ARTERIOVENOUS FISTULA  ;  Surgeon: Serafina Mitchell, MD;  Location: [place];  Service: Vascular;  Laterality: Left;   SIMPLE MASTECTOMY WITH AXILLARY SENTINEL NODE BIOPSY Bilateral 08/22/2021   Procedure: BILATERAL SIMPLE MASTECTOMY;  Surgeon: Erroll Luna, MD;  Location: [place];  Service: General;  Laterality: Bilateral;   TUBAL LIGATION     FH  Family History  Problem Relation Age of Onset   Diabetes Mother    Kidney disease Mother    Diabetes Father    Heart disease Father    Kidney disease Father    Hypertension Father    Diabetes Sister    Asthma Sister    Lupus Sister    Diabetes Brother    Diabetes Brother    Diabetes Brother    Diabetes Brother    Cancer Maternal Grandmother 1       ovarian cancer   Ovarian cancer Maternal Grandmother    Diabetes Maternal Grandfather    Diabetes Paternal Grandmother    Colon cancer Neg Hx    Colon polyps Neg Hx    Esophageal cancer Neg Hx    Rectal cancer Neg Hx    Stomach cancer Neg Hx    Breast cancer Neg Hx    Endometrial cancer Neg Hx    Pancreatic cancer Neg Hx    Prostate cancer Neg Hx    SH  reports that she has never smoked. She has never used smokeless tobacco. She reports that she does not currently use alcohol. She reports  that she does not use drugs. Allergies  Allergies  Allergen Reactions   Docetaxel Nausea Only    Diaphoretic.  Patient had hypersensitivity reaction to docetaxel.  See progress note from 05/24/2021.  Patient able to complete infusion.   Norvasc [Amlodipine] Swelling   Tape Other (See Comments)    Burns skin.  Please use paper tape only   Home medications Prior to Admission medications   Medication Sig Start Date End Date Taking? Authorizing Provider  acetaminophen (TYLENOL) 500 MG tablet Take 500 mg by mouth every 6 (six) hours as needed for mild pain, fever or headache.   Yes [provider]  aspirin EC 81 MG EC tablet Take 1 tablet (81 mg total) by  mouth daily. 03/31/14  Yes Luiz Blare Y, DO  carvedilol (COREG) 25 MG tablet Take 25 mg by mouth 2 (two) times daily with a meal. 11/23/15  Yes [provider]  cetirizine (ZYRTEC) 10 MG tablet Take 10 mg by mouth daily.   Yes [provider]  cholecalciferol (VITAMIN D3) 25 MCG (1000 UNIT) tablet Take 1,000 Units by mouth daily.   Yes [provider]  cyanocobalamin 1000 MCG tablet Take 1,000 mcg by mouth daily.   Yes [provider]  escitalopram (LEXAPRO) 10 MG tablet Take 10 mg by mouth daily. 03/08/21  Yes [provider]  furosemide (LASIX) 40 MG tablet Take 40 mg by mouth 2 (two) times daily as needed for fluid.   Yes [provider]  gabapentin (NEURONTIN) 300 MG capsule Take 1 capsule (300 mg total) by mouth 2 (two) times daily. Start at 1 cap once daily, and increase to twice daily if she tolerates. Continue 635m at night 08/07/21  Yes FTruitt Merle MD  insulin glargine (LANTUS SOLOSTAR) 100 UNIT/ML Solostar Pen Inject 25 Units into the skin 2 (two) times daily. Patient taking differently: Inject 20 Units into the skin 2 (two) times daily. 06/03/21  Yes MKathie Dike MD  magnesium oxide (MAG-OX) 400 MG tablet Take 400 mg by mouth 2 (two) times daily.   Yes [provider]  MOUNJARO 2.5 MG/0.5ML Pen Inject 2.5 mg into the skin every Thursday. 06/28/21  Yes [provider]  mycophenolate (MYFORTIC) 180 MG EC tablet Take 360 mg by mouth 2 (two) times daily.   Yes [provider]  NOVOLOG FLEXPEN 100 UNIT/ML FlexPen Inject 5 Units into the skin 3 (three) times daily with meals. 04/28/21  Yes [provider]  ondansetron (ZOFRAN) 8 MG tablet Take 1 tablet (8 mg total) by mouth 2 (two) times daily as needed for refractory nausea / vomiting. Start on day 3 after chemo. 05/02/21  Yes FTruitt Merle MD  pantoprazole (PROTONIX) 40 MG tablet Take 40 mg by mouth 2 (two) times daily. 08/02/15  Yes [provider]  predniSONE (DELTASONE) 5 MG tablet Take 5 mg by mouth daily.  01/04/17  Yes [provider]  rosuvastatin (CRESTOR) 5 MG tablet Take 5 mg by mouth at bedtime.   Yes [provider]  tacrolimus (PROGRAF) 1 MG capsule Take 3 mg by mouth daily. 04/12/21  Yes [provider]  Accu-Chek Softclix Lancets lancets Use as instructed Patient taking differently: 1 each by Other route See admin instructions. Use as instructed 08/03/20   ERenato Shin MD  Blood Glucose Monitoring Suppl (ACCU-CHEK AVIVA CONNECT) w/Device KIT Check sugar 1x daily Patient taking differently: 1 each by Other route See admin instructions. Check sugar 1x daily 08/03/20   ERenato Shin MD  exemestane (AROMASIN) 25 MG tablet Take 1 tablet (25 mg total) by mouth daily after breakfast. Patient not taking: Reported on 08/17/2021 08/07/21   FTruitt Merle MD  glucose blood (ACCU-CHEK AVIVA PLUS) test strip Check sugar 1x daily Patient taking differently: 1 each by Other route See admin instructions. Check sugar 1x daily 08/03/20   ERenato Shin MD  Insulin Pen Needle (PEN NEEDLES) 30G X 8 MM MISC 1 each by Does not apply route daily. E11.9 09/01/19   ERenato Shin MD    Current Medications Scheduled Meds:  carvedilol  25 mg Oral BID WC   enoxaparin  (LOVENOX) injection  40 mg Subcutaneous Q24H   escitalopram  10 mg Oral Daily  exemestane  25 mg Oral QPC breakfast   gabapentin  300 mg Oral BID   insulin aspart  0-15 Units Subcutaneous TID WC   insulin glargine-yfgn  10 Units Subcutaneous Daily   loratadine  10 mg Oral Daily   mycophenolate  360 mg Oral BID   pantoprazole  40 mg Oral BID   predniSONE  5 mg Oral Daily   rosuvastatin  5 mg Oral QHS   tacrolimus  3 mg Oral Daily   Continuous Infusions:  sodium chloride 50 mL/hr at 08/23/21 0830   PRN Meds:.acetaminophen, furosemide, HYDROmorphone (DILAUDID) injection, metoprolol tartrate, ondansetron **OR** ondansetron (ZOFRAN) IV, oxyCODONE, simethicone, zolpidem  CBC Recent Labs  Lab 08/18/21 1059 08/23/21 0214  WBC 6.0 9.7  NEUTROABS 4.3  --   HGB 10.8* 10.9*  HCT 32.4* 34.0*  MCV 95.0 98.3  PLT 174 465   Basic Metabolic Panel Recent Labs  Lab 08/18/21 1059 08/23/21 0830  NA 137 133*  K 4.7 5.8*  CL 104 98  CO2 28 25  GLUCOSE 207* 175*  BUN 32* 48*  CREATININE 1.34* 2.59*  CALCIUM 10.2 9.4    Physical Exam   Blood pressure (!) 130/59, pulse 92, temperature 99.2 F (37.3 C), temperature source Oral, resp. rate 16, height '5\' 1"'  (1.549 m), weight 100.7 kg, SpO2 99 %. GEN: NAD, comfortable appearing ENT: NCAT EYES: EOMI CV: Regular, normal S1 and S2 PULM: Clear bilaterally ABD: Soft, nontender SKIN: Surgical bandages noted EXT: No peripheral edema  Assessment 78F s/p ddKT 2017 with AKI post b/l mastectomy  AKI, s/p dd KT BL SCr around 1.3 or less Outpt IS Tac/MMF/Pred, has been continued No obvious causes, etiology unclear, ? Erroneous UOP not well recorded it appears Hyperkalemia, mild, s/p Lokelma S/p b/l mastectomy 08/22/21 as BRCA carrier Chronic IS, cont on outpt regimen DM2 HTN Invasive ductal CA s/p lumpectomy and CTX  Plan As above Repeat BMP Cont supportive care, Will cont to follow   Rexene Agent  [phone] pgr 08/23/2021, 3:37  PM

## 2021-08-23 NOTE — Progress Notes (Signed)
Breast cancer bag given and 2 soft pillows for bilateral arms.  Pt too sleepy at present to review JP drain education.  ?

## 2021-08-23 NOTE — Progress Notes (Signed)
Initial Nutrition Assessment ? ?DOCUMENTATION CODES:  ?Obesity unspecified ? ?INTERVENTION:  ?Continue current diet as ordered, encourage PO intake. ?Glucerna Shake po TID, each supplement provides 220 kcal and 10 grams of protein as glucose is elevated ?MVI with minerals daily ? ?NUTRITION DIAGNOSIS:  ?Increased nutrient needs related to cancer and cancer related treatments as evidenced by estimated needs. ? ?GOAL:  ?Patient will meet greater than or equal to 90% of their needs ? ?MONITOR:  ?PO intake, Supplement acceptance, Weight trends ? ?REASON FOR ASSESSMENT:  ?Malnutrition Screening Tool ?  ? ?ASSESSMENT:  ?Pt with extensive medical hx including CAD, ESRD (s/p transplant 2017, previously on HD), GERD, HTN, HLD, DM type 2, neuropathy, and breast cancer (dx 01/2021, recently completed chemo) presented to [facility] for planned double mastectomy surgery. ? ?2/28 - Op, Bilateral simple mastectomy  ? ?Pt sleeping soundly at the time of assessment. Did not wake when door was opened or when name was called. ? ?Reviewed chart and pt was admitted to [facility] in January and was followed by RD at that time. Reported UBW of ~210 lbs. Noted some weight gain, but unsure if current is accurate as last measured wt at PCP was <1 week ago and was ~10kg lower. ? ?Pt reported previously that she is drinking ensure at home. Will add while admitted to augment intake. Breakfast tray noted at bedside ~75% consumed. ? ?Nutritionally Relevant Medications: ?Scheduled Meds: ? insulin aspart  0-15 Units Subcutaneous TID WC  ? insulin glargine-yfgn  10 Units Subcutaneous Daily  ? mycophenolate  360 mg Oral BID  ? pantoprazole  40 mg Oral BID  ? predniSONE  5 mg Oral Daily  ? rosuvastatin  5 mg Oral QHS  ? sodium zirconium cyclosilicate  10 g Oral Once  ? tacrolimus  3 mg Oral Daily  ? ?Continuous Infusions: ? sodium chloride 50 mL/hr at 08/23/21 0830  ? ?PRN Meds: furosemide, ondansetron, simethicone ? ?Labs Reviewed: ?Sodium  133 ?Potassium 5.8 ?BUN 48, creatinine 2.59 ?SBG ranges from 155-206 mg/dL over the last 24 hours ?HgbA1c 7.3% (2/24) ? ?NUTRITION - FOCUSED PHYSICAL EXAM: ?Defer to follow-up assessment ? ?Diet Order:   ?Diet Order   ? ?       ?  Diet Carb Modified Fluid consistency: Thin; Room service appropriate? Yes  Diet effective now       ?  ? ?  ?  ? ?  ? ? ?EDUCATION NEEDS:  ?No education needs have been identified at this time ? ?Skin:  Skin Assessment: Reviewed RN Assessment (surgical incisions, bilateral breasts) ? ?Last BM:  2/27 ? ?Height:  ?Ht Readings from Last 1 Encounters:  ?08/22/21 5\' 1"  (1.549 m)  ? ?Weight:  ?Wt Readings from Last 1 Encounters:  ?08/22/21 100.7 kg  ? ?Ideal Body Weight:  47.7 kg ? ?BMI:  Body mass index is 41.95 kg/m?. ? ?Estimated Nutritional Needs:  ?Kcal:  1800-2000 kcal/d ?Protein:  90-110 g/d ?Fluid:  2-2.2L/d ? ? ?Ranell Patrick, RD, LDN ?Clinical Dietitian ?RD pager # available in [place]  ?After hours/weekend pager # available in [place] ?

## 2021-08-23 NOTE — Anesthesia Postprocedure Evaluation (Signed)
Anesthesia Post Note ? ?Patient: Cynthia [name] ? ?Procedure(s) Performed: BILATERAL SIMPLE MASTECTOMY (Bilateral: Breast) ? ?  ? ?Patient location during evaluation: PACU ?Anesthesia Type: Regional and General ?Level of consciousness: awake and alert ?Pain management: pain level controlled ?Vital Signs Assessment: post-procedure vital signs reviewed and stable ?Respiratory status: spontaneous breathing, nonlabored ventilation, respiratory function stable and patient connected to nasal cannula oxygen ?Cardiovascular status: blood pressure returned to baseline and stable ?Postop Assessment: no apparent nausea or vomiting ?Anesthetic complications: no ? ? ?No notable events documented. ? ?Last Vitals:  ?Vitals:  ? 08/22/21 2242 08/23/21 0722  ?BP: 134/81 (!) 130/59  ?Pulse: 71 92  ?Resp: 17 16  ?Temp: 36.5 ?C 37.3 ?C  ?SpO2: 100% 99%  ?  ?Last Pain:  ?Vitals:  ? 08/23/21 0838  ?TempSrc:   ?PainSc: 6   ? ? ?  ?  ?  ?  ?  ?  ? ?Cynthia [name] ? ? ? ? ?

## 2021-08-23 NOTE — Progress Notes (Addendum)
Mobility Specialist Progress Note: ? ? 08/23/21 1442  ?Mobility  ?Activity Ambulated with assistance in hallway  ?Level of Assistance Contact guard assist, steadying assist  ?Assistive Device Front wheel walker  ?Distance Ambulated (ft) 60 ft  ?Activity Response Tolerated fair  ?$Mobility charge 1 Mobility  ? ?Pt received in chair willing to participate in mobility. Complaints of 6/10 incision pain. Pt very sleepy but still wanting to mobilize.Required 1 short seated break. Left in bed with call bell in reach and all needs met.  ? ?Jakeim Sedore ?Mobility Specialist ?Primary Phone [phone] ? ?

## 2021-08-23 NOTE — Progress Notes (Signed)
1 Day Post-Op  ? ?Subjective/Chief Complaint: ?Pt with poor pain control ?Not moving much  ? ? ?Objective: ?Vital signs in last 24 hours: ?Temp:  [97.5 ?F (36.4 ?C)-99.2 ?F (37.3 ?C)] 99.2 ?F (37.3 ?C) (03/01 5277) ?Pulse Rate:  [69-92] 92 (03/01 0722) ?Resp:  [12-18] 16 (03/01 0722) ?BP: (80-171)/(50-81) 130/59 (03/01 0722) ?SpO2:  [99 %-100 %] 99 % (03/01 0722) ?Weight:  [100.7 kg] 100.7 kg (02/28 0926) ?Last BM Date : 08/21/21 ? ?Intake/Output from previous day: ?02/28 0701 - 03/01 0700 ?In: 1000 [I.V.:800; IV Piggyback:200] ?Out: 25 [Blood:25] ?Intake/Output this shift: ?No intake/output data recorded. ? ?General appearance: alert and cooperative ?Resp: clear to auscultation bilaterally ?Cardio: NSR ?Incision/Wound:flaps viable no hematoma bilaterally serosanguinous drainage bilaterally  ? ?Lab Results:  ?Recent Labs  ?  08/23/21 ?0214  ?WBC 9.7  ?HGB 10.9*  ?HCT 34.0*  ?PLT 161  ? ?BMET ?No results for input(s): NA, K, CL, CO2, GLUCOSE, BUN, CREATININE, CALCIUM in the last 72 hours. ?PT/INR ?No results for input(s): LABPROT, INR in the last 72 hours. ?ABG ?No results for input(s): PHART, HCO3 in the last 72 hours. ? ?Invalid input(s): PCO2, PO2 ? ?Studies/Results: ?No results found. ? ?Anti-infectives: ?Anti-infectives (From admission, onward)  ? ? Start     Dose/Rate Route Frequency Ordered Stop  ? 08/22/21 1545  ceFAZolin (ANCEF) IVPB 2g/100 mL premix       ? 2 g ?200 mL/hr over 30 Minutes Intravenous Every 8 hours 08/22/21 1544 08/22/21 1700  ? 08/22/21 1000  ceFAZolin (ANCEF) IVPB 2g/100 mL premix       ? 2 g ?200 mL/hr over 30 Minutes Intravenous On call to O.R. 08/22/21 0946 08/22/21 1151  ? ?  ? ? ?Assessment/Plan: ?s/p Procedure(s): ?BILATERAL SIMPLE MASTECTOMY (Bilateral) ?Plan for discharge tomorrow ? ?Ambulate ?Work on pain control ?Add gabapentin ?Continue diet  ?Check CMET today  ? ?CBC ok TODAY  ?START LOVANOX  ?HOPEFULLY HOME IN AM  ?WEAN O2  ? LOS: 0 days  ? ? ?Wai Minotti A Laqueena Hinchey ?08/23/2021 ? ?

## 2021-08-23 NOTE — Telephone Encounter (Signed)
? ? ?  Patient Name: Cynthia [name]  ?DOB: 11-08-1970 ?MRN: [id number] ? ?Primary Cardiologist: Buford Dresser, MD ? ?Chart reviewed as part of pre-operative protocol coverage.  ? ?The patient was seen by Dr.Christopher 08/08/21  ?"Preoperative cardiovascular evaluation ?Based on available date, patient's RCRI score = 1 (insulin), which carries a 6% 30-day risk of death, MI, or cardiac arrest. ?  ?The patient is not currently having active cardiac symptoms, and they can achieve >4 METs of activity. ?  ?According to ACC/AHA Guidelines, no further testing is needed.  Proceed with surgery at acceptable risk.  Our service is available as needed in the peri-operative period.  Aspirin can be held peri-operatively if needed, restart when reasonable from surgical perspective." ? ?She underwent Bilateral simple mastectomy yesterday. She is still in hospital. The is possible Bilateral Salpingo-oophorectomy.  ? ?She will be cleared at accepted above risk.  ? ?I will route this recommendation to the requesting party via Epic fax function and remove from pre-op pool. ? ?Please call with questions. ? ?Leanor Kail, PA ?08/23/2021, 8:59 AM  ?

## 2021-08-24 ENCOUNTER — Telehealth: Payer: Self-pay | Admitting: *Deleted

## 2021-08-24 ENCOUNTER — Inpatient Hospital Stay (HOSPITAL_COMMUNITY): Payer: Medicare HMO

## 2021-08-24 ENCOUNTER — Encounter: Payer: Medicare HMO | Admitting: Physical Therapy

## 2021-08-24 DIAGNOSIS — R509 Fever, unspecified: Secondary | ICD-10-CM

## 2021-08-24 DIAGNOSIS — Z1509 Genetic susceptibility to other malignant neoplasm: Secondary | ICD-10-CM

## 2021-08-24 DIAGNOSIS — N179 Acute kidney failure, unspecified: Secondary | ICD-10-CM

## 2021-08-24 LAB — CBC WITH DIFFERENTIAL/PLATELET
Abs Immature Granulocytes: 0.03 10*3/uL (ref 0.00–0.07)
Basophils Absolute: 0 10*3/uL (ref 0.0–0.1)
Basophils Relative: 0 %
Eosinophils Absolute: 0.1 10*3/uL (ref 0.0–0.5)
Eosinophils Relative: 1 %
HCT: 27.9 % — ABNORMAL LOW (ref 36.0–46.0)
Hemoglobin: 9.3 g/dL — ABNORMAL LOW (ref 12.0–15.0)
Immature Granulocytes: 0 %
Lymphocytes Relative: 7 %
Lymphs Abs: 0.6 10*3/uL — ABNORMAL LOW (ref 0.7–4.0)
MCH: 32.3 pg (ref 26.0–34.0)
MCHC: 33.3 g/dL (ref 30.0–36.0)
MCV: 96.9 fL (ref 80.0–100.0)
Monocytes Absolute: 0.7 10*3/uL (ref 0.1–1.0)
Monocytes Relative: 8 %
Neutro Abs: 7.3 10*3/uL (ref 1.7–7.7)
Neutrophils Relative %: 84 %
Platelets: 130 10*3/uL — ABNORMAL LOW (ref 150–400)
RBC: 2.88 MIL/uL — ABNORMAL LOW (ref 3.87–5.11)
RDW: 13.1 % (ref 11.5–15.5)
WBC: 8.7 10*3/uL (ref 4.0–10.5)
nRBC: 0 % (ref 0.0–0.2)

## 2021-08-24 LAB — COMPREHENSIVE METABOLIC PANEL
ALT: 6 U/L (ref 0–44)
AST: 20 U/L (ref 15–41)
Albumin: 3.2 g/dL — ABNORMAL LOW (ref 3.5–5.0)
Alkaline Phosphatase: 68 U/L (ref 38–126)
Anion gap: 10 (ref 5–15)
BUN: 47 mg/dL — ABNORMAL HIGH (ref 6–20)
CO2: 23 mmol/L (ref 22–32)
Calcium: 9.5 mg/dL (ref 8.9–10.3)
Chloride: 98 mmol/L (ref 98–111)
Creatinine, Ser: 2.3 mg/dL — ABNORMAL HIGH (ref 0.44–1.00)
GFR, Estimated: 25 mL/min — ABNORMAL LOW (ref >60)
Glucose, Bld: 191 mg/dL — ABNORMAL HIGH (ref 70–99)
Potassium: 5 mmol/L (ref 3.5–5.1)
Sodium: 131 mmol/L — ABNORMAL LOW (ref 135–145)
Total Bilirubin: 0.6 mg/dL (ref 0.3–1.2)
Total Protein: 5.3 g/dL — ABNORMAL LOW (ref 6.5–8.1)

## 2021-08-24 LAB — URINALYSIS, ROUTINE W REFLEX MICROSCOPIC
Bilirubin Urine: NEGATIVE
Glucose, UA: NEGATIVE mg/dL
Hgb urine dipstick: NEGATIVE
Ketones, ur: NEGATIVE mg/dL
Leukocytes,Ua: NEGATIVE
Nitrite: NEGATIVE
Protein, ur: NEGATIVE mg/dL
Specific Gravity, Urine: 1.019 (ref 1.005–1.030)
pH: 5 (ref 5.0–8.0)

## 2021-08-24 LAB — CBC
HCT: 27.7 % — ABNORMAL LOW (ref 36.0–46.0)
Hemoglobin: 9.1 g/dL — ABNORMAL LOW (ref 12.0–15.0)
MCH: 31.7 pg (ref 26.0–34.0)
MCHC: 32.9 g/dL (ref 30.0–36.0)
MCV: 96.5 fL (ref 80.0–100.0)
Platelets: 125 10*3/uL — ABNORMAL LOW (ref 150–400)
RBC: 2.87 MIL/uL — ABNORMAL LOW (ref 3.87–5.11)
RDW: 13.1 % (ref 11.5–15.5)
WBC: 8.8 10*3/uL (ref 4.0–10.5)
nRBC: 0 % (ref 0.0–0.2)

## 2021-08-24 LAB — PROCALCITONIN: Procalcitonin: 1.37 ng/mL

## 2021-08-24 LAB — GLUCOSE, CAPILLARY
Glucose-Capillary: 195 mg/dL — ABNORMAL HIGH (ref 70–99)
Glucose-Capillary: 304 mg/dL — ABNORMAL HIGH (ref 70–99)
Glucose-Capillary: 259 mg/dL — ABNORMAL HIGH (ref 70–99)
Glucose-Capillary: 254 mg/dL — ABNORMAL HIGH (ref 70–99)

## 2021-08-24 LAB — SURGICAL PATHOLOGY

## 2021-08-24 MED ORDER — ACETAMINOPHEN 500 MG PO TABS: 1000.0000 mg | ORAL_TABLET | Freq: Four times a day (QID) | ORAL | Status: DC | PRN

## 2021-08-24 MED ORDER — INSULIN GLARGINE-YFGN 100 UNIT/ML ~~LOC~~ SOLN
12.0000 [IU] | Freq: Every day | SUBCUTANEOUS | Status: DC
  Administered 2021-08-25: 12 [IU] via SUBCUTANEOUS
  Filled 2021-08-24: qty 0.12

## 2021-08-24 NOTE — Assessment & Plan Note (Addendum)
Recent a1c of 7.3.  Continue home medications. ?

## 2021-08-24 NOTE — Assessment & Plan Note (Addendum)
Stable.  Continue home medications at discharge. ?

## 2021-08-24 NOTE — Telephone Encounter (Signed)
Received medical clearance from cardiology, note in EPIC  ?

## 2021-08-24 NOTE — Assessment & Plan Note (Deleted)
POD day #2 ?Incision clean and dry  ?Post op care per surgery team  ?

## 2021-08-24 NOTE — Progress Notes (Signed)
?   08/24/21 0855  ?Assess: MEWS Score  ?Temp (!) 101.7 ?F (38.7 ?C)  ?BP (!) 134/51  ?Pulse Rate 99  ?Resp 17  ?Level of Consciousness Alert  ?SpO2 94 %  ?O2 Device Room Air  ?Assess: MEWS Score  ?MEWS Temp 2  ?MEWS Systolic 0  ?MEWS Pulse 0  ?MEWS RR 0  ?MEWS LOC 0  ?MEWS Score 2  ?MEWS Score Color Yellow  ?Assess: if the MEWS score is Yellow or Red  ?Were vital signs taken at a resting state? Yes  ?Focused Assessment No change from prior assessment  ?Early Detection of Sepsis Score *See Row Information* Medium  ?MEWS guidelines implemented *See Row Information* Yes  ?Treat  ?MEWS Interventions Administered prn meds/treatments  ?Pain Scale 0-10  ?Pain Score 0  ?Take Vital Signs  ?Increase Vital Sign Frequency  Yellow: Q 2hr X 2 then Q 4hr X 2, if remains yellow, continue Q 4hrs  ?Escalate  ?MEWS: Escalate Yellow: discuss with charge nurse/RN and consider discussing with provider and RRT  ?Notify: Charge Nurse/RN  ?Name of Charge Nurse/RN Notified Ruth/ Mariam RN  ?Date Charge Nurse/RN Notified 08/24/21  ?Time Charge Nurse/RN Notified 0920  ?Notify: Provider  ?Provider Name/Title Dr. Brantley Stage  ?Date Provider Notified 08/24/21  ?Time Provider Notified [phone]  ?Notification Type Page  ?Notification Reason  ?(Yellow MEWS)  ?Document  ?Patient Outcome Other (Comment)  ?Progress note created (see row info) Yes  ? ? ?

## 2021-08-24 NOTE — Progress Notes (Signed)
Admit: 08/22/2021 ?LOS: 1 ? ?2F s/p ddKT 2017 with AKI post b/l mastectomy ? ?Subjective:  ?SCr slightly better today, K improved ?Renal tplt doppler neg for acute issues  ?UOP not recorded ?BPs stable ? ?03/01 0701 - 03/02 0700 ?In: 1706.1 [I.V.:1706.1] ?Out: 455 [Drains:455] ? ?Filed Weights  ? 08/22/21 0926  ?Weight: 100.7 kg  ? ? ?Scheduled Meds: ? carvedilol  25 mg Oral BID WC  ? enoxaparin (LOVENOX) injection  40 mg Subcutaneous Q24H  ? escitalopram  10 mg Oral Daily  ? exemestane  25 mg Oral QPC breakfast  ? feeding supplement (GLUCERNA SHAKE)  237 mL Oral TID BM  ? gabapentin  300 mg Oral BID  ? insulin aspart  0-15 Units Subcutaneous TID WC  ? [START ON 08/25/2021] insulin glargine-yfgn  12 Units Subcutaneous Daily  ? loratadine  10 mg Oral Daily  ? multivitamin with minerals  1 tablet Oral Daily  ? mycophenolate  360 mg Oral BID  ? pantoprazole  40 mg Oral BID  ? predniSONE  5 mg Oral Daily  ? rosuvastatin  5 mg Oral QHS  ? tacrolimus  3 mg Oral Daily  ? ?Continuous Infusions: ? sodium chloride 50 mL/hr at 08/23/21 0830  ? sodium chloride 100 mL/hr at 08/24/21 0904  ? ?PRN Meds:.acetaminophen, furosemide, HYDROmorphone (DILAUDID) injection, metoprolol tartrate, ondansetron **OR** ondansetron (ZOFRAN) IV, oxyCODONE, simethicone, zolpidem ? ?Current Labs: reviewed  ? ? ?Physical Exam:  Blood pressure (!) 126/53, pulse 95, temperature (!) 102.1 ?F (38.9 ?C), temperature source Oral, resp. rate 16, height _0  (1.549 m), weight 100.7 kg, SpO2 98 %. ?GEN: NAD, comfortable appearing ?ENT: NCAT ?EYES: EOMI ?CV: Regular, normal S1 and S2 ?PULM: Clear bilaterally ?ABD: Soft, nontender ?SKIN: Surgical bandages noted ?EXT: No peripheral edema ?LUE AVF +B/T ? ?A ?AKI, s/p dd KT ?BL SCr around 1.3 or less ?Outpt IS Tac/MMF/Pred, has been continued ?08/23/21: tplt US/doppler negative ?No obvious causes, etiology unclear, seems to be improving ?Hyperkalemia, mild, s/p Lokelma; improved ?S/p b/l mastectomy 08/22/21 as BRCA  carrier ?Chronic IS, cont on outpt regimen ?DM2 ?HTN ?Invasive ductal CA s/p lumpectomy and CTX ? ?P ?Seems ok for DC today ?I will arrange labs at our office tomorrow  ?Has f/u appt with me already established 08/29/21 ? ? ?Pearson Grippe MD ?08/24/2021, 11:13 AM ? ?Recent Labs  ?Lab 08/23/21 ?0830 08/23/21 ?1642 08/24/21 ?0418  ?NA 133* 129* 131*  ?K 5.8* 5.4* 5.0  ?CL 98 98 98  ?CO2 _1 ?GLUCOSE 175* 192* 191*  ?BUN 48* 53* 47*  ?CREATININE 2.59* 2.60* 2.30*  ?CALCIUM 9.4 9.2 9.5  ? ?Recent Labs  ?Lab 08/18/21 ?1059 08/23/21 ?0214  ?WBC 6.0 9.7  ?NEUTROABS 4.3  --   ?HGB 10.8* 10.9*  ?HCT 32.4* 34.0*  ?MCV 95.0 98.3  ?PLT 174 161  ? ? ? ? ? ? ? ? ? ?  ?

## 2021-08-24 NOTE — Assessment & Plan Note (Addendum)
Patient developed fever on postop day 2 after bilateral mastectomy for breast cancer and +BRCA.  ?No clear etiology for fever has been identified.  UA was negative.  No skin rashes noted.  No GI symptoms such as nausea vomiting or diarrhea. ?Chest x-ray did raise possibility of infiltrate versus atelectasis the patient denies any respiratory symptoms including cough shortness of breath.  She was mobilized and placed on incentive spirometer.  WBC was normal.  If she was not given any antibiotics. ?Fever has subsided.  Chest x-ray from this morning showed similar findings.  Discussed with Dr. Brantley Stage with general surgery.  Plan is for discharge today which is reasonable considering resolution of fever and overall stability.  ?

## 2021-08-24 NOTE — Consult Note (Signed)
Initial Consultation Note   Patient: Cynthia [name] QQV:[id number] DOB: Sep 12, 1970 PCP: Arthur Holms, NP DOA: 08/22/2021 DOS: the patient was seen and examined on 08/24/2021 Primary service: Erroll Luna, MD  Referring physician: Dr. Brantley Stage  Reason for consult: post op fever/worsening renal function   Assessment/Plan: Assessment and Plan: * BRCA2 gene mutation positive s/p bilateral masectomy  POD day #2 Incision clean and dry  Post op care per surgery team   Fever 51 year old female with hx of breast cancer and +BRCA gene mutation who is post op day TWO for bilateral mastectomy who had fever to 102.1 today -she is asymptomatic and specifically denies shortness of breath, cough, diarrhea, stomach pain, dysuria or other urinary symptoms, no open wounds besides surgical wounds which are clean and dry.  -UA from yesterday with no signs of infection and she has no symptoms -CXR with atelectasis vs. Pneumonia. Procalcitonin not indicated since on immunosuppressants with transplant. Lung exam clear. Will repeat 2V tomorrow  -BC pending -CBC ordered for today, but no elevated WBC yesterday and no other SIRS/sepsis criteria.  -is on immunosuppression, no longer on chemo and not neutropenic off differential on 2/13.  will add on differential to CBC today to make sure no neutropenic fever  -IS to bedside -see no infectious source for fever at this point, will continue to monitor fever curve, trend WBC and follow clinically.    AKI (acute kidney injury/renal transplant on immunosuppresants  Creatinine down trending Nephrology consulted and following, will defer to them Does have lasix 59m BID prn ordered -continue immunosuppressant therapy   HTN (hypertension) Well controlled, continue home coreg  Insulin dependent type 2 diabetes mellitus ([place] Recent a1c of 7.3 Continue long acting insulin and SSI with accuchecks while inpatient     TRH will continue to follow the patient.  HPI:  ERiannah [name] a 51y.o. female with past medical history of renal transplant on immunosuppressants, CKD stage IIIa, insulin-dependent diabetes mellitus, hypertension, hyperlipidemia, breast cancer status post left lumpectomy and chemotherapy (last chemo 07/14/21 and completed) who was recently admitted on 08/22/2021 by Dr. CBrantley Stagefor bilateral mastectomy for BRCA 2+, to lessen future malignancy risks. She is post op day #2 and had fever to 102.1 today. WE are being consulted for fever in post op patient. Renal function was trending upward and nephrology has also been consulted.   Her husband is at bedside and translates. She denies any chills, burning with urination, coughing or shortness of breath. She overall feels okay. She has been eating and drinking well. Denies any diarrhea, is passing gas. No stomach pain, N/V/D.  No open wounds besides surgical wounds.    Review of Systems: As mentioned in the history of present illness. All other systems reviewed and are negative. Past Medical History:  Diagnosis Date   Allergy    pollen   Anemia    Anxiety    BRCA2 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 03/01/2021   Breast cancer ([facility]    Cataract    surgical repair bilateral   Coronary artery disease    Depression    [place]    Home HD 5x per week- not on dialysis now had tramsplant 4/17   Family history of ovarian cancer 02/22/2021   GERD (gastroesophageal reflux disease)    Hearing loss 2017   right ear   History of blood transfusion    transfusion reaction   Hyperlipidemia    Hypertension    Insulin-dependent diabetes mellitus with  renal complications    Type I beginning now type II per pt-dr levy also II; Per patient 08/18/21 she is Type 2   Kidney transplant recipient    Neuromuscular disorder ([place])    NEUROPATHY   Sleep apnea    Past Surgical History:  Procedure Laterality Date   ABDOMINAL HYSTERECTOMY     BREAST EXCISIONAL BIOPSY Right 08/2015   BREAST  LUMPECTOMY WITH RADIOACTIVE SEED LOCALIZATION Right 09/14/2015   Procedure: RIGHT BREAST LUMPECTOMY WITH RADIOACTIVE SEED LOCALIZATION;  Surgeon: Donnie Mesa, MD;  Location: [place];  Service: General;  Laterality: Right;   BREAST LUMPECTOMY WITH RADIOACTIVE SEED LOCALIZATION Left 03/02/2021   Procedure: LEFT BREAST SEED LUMPECTOMY LEFT SENTINEL LYMPH NODE [place];  Surgeon: Erroll Luna, MD;  Location: [place];  Service: General;  Laterality: Left;  GEN AND PEC BLOCK   BREAST SURGERY Bilateral    biopsy bilateral   CARDIAC CATHETERIZATION     CATARACT EXTRACTION Bilateral    bilateral   CHOLECYSTECTOMY     CYST REMOVAL NECK     DIALYSIS FISTULA CREATION Left    EYE SURGERY Bilateral    lazer   kidney transplant     LEFT HEART CATH AND CORONARY ANGIOGRAPHY N/A 03/04/2019   Procedure: LEFT HEART CATH AND CORONARY ANGIOGRAPHY;  Surgeon: Martinique, Peter M, MD;  Location: [place] [facility];  Service: Cardiovascular;  Laterality: N/A;   LEFT HEART CATHETERIZATION WITH CORONARY ANGIOGRAM N/A 03/30/2014   Procedure: LEFT HEART CATHETERIZATION WITH CORONARY ANGIOGRAM;  Surgeon: Sinclair Grooms, MD;  Location: [facility] CATH LAB;  Service: Cardiovascular;  Laterality: N/A;   LIPOMA EXCISION N/A 02/06/2017   Procedure: EXCISION POSTERIOR NECK SEBACEOUS CYST;  Surgeon: Coralie Keens, MD;  Location: [place];  Service: General;  Laterality: N/A;   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   Procedure: REPAIR OF ARTERIOVENOUS FISTULA ANEURYSM;  Surgeon: Serafina Mitchell, MD;  Location: [facility];  Service: Vascular;  Laterality: Left;   REVISON OF ARTERIOVENOUS FISTULA Left 09/28/2013   Procedure: EXCISE ESCHAR LEFT ARM  ARTERIOVENOUS FISTULA WITH PLICATION OF LEFT ARM ARTERIOVENOUS FISTULA;  Surgeon: Elam Dutch, MD;  Location: [place];  Service: Vascular;  Laterality: Left;   REVISON OF ARTERIOVENOUS FISTULA Left 08/26/2015   Procedure: RESECTION  ANEURYSM OF LEFT ARM ARTERIOVENOUS FISTULA  ;  Surgeon: Serafina Mitchell, MD;  Location: [place];  Service: Vascular;  Laterality: Left;   SIMPLE MASTECTOMY WITH AXILLARY SENTINEL NODE BIOPSY Bilateral 08/22/2021   Procedure: BILATERAL SIMPLE MASTECTOMY;  Surgeon: Erroll Luna, MD;  Location: [place];  Service: General;  Laterality: Bilateral;   TUBAL LIGATION     Social History:  reports that she has never smoked. She has never used smokeless tobacco. She reports that she does not currently use alcohol. She reports that she does not use drugs.  Allergies  Allergen Reactions   Docetaxel Nausea Only    Diaphoretic.  Patient had hypersensitivity reaction to docetaxel.  See progress note from 05/24/2021.  Patient able to complete infusion.   Norvasc [Amlodipine] Swelling   Tape Other (See Comments)    Burns skin.  Please use paper tape only    Family History  Problem Relation Age of Onset   Diabetes Mother    Kidney disease Mother    Diabetes Father    Heart disease Father    Kidney disease Father    Hypertension Father    Diabetes Sister    Asthma Sister  Lupus Sister    Diabetes Brother    Diabetes Brother    Diabetes Brother    Diabetes Brother    Cancer Maternal Grandmother 31       ovarian cancer   Ovarian cancer Maternal Grandmother    Diabetes Maternal Grandfather    Diabetes Paternal Grandmother    Colon cancer Neg Hx    Colon polyps Neg Hx    Esophageal cancer Neg Hx    Rectal cancer Neg Hx    Stomach cancer Neg Hx    Breast cancer Neg Hx    Endometrial cancer Neg Hx    Pancreatic cancer Neg Hx    Prostate cancer Neg Hx     Prior to Admission medications   Medication Sig Start Date End Date Taking? Authorizing Provider  acetaminophen (TYLENOL) 500 MG tablet Take 500 mg by mouth every 6 (six) hours as needed for mild pain, fever or headache.   Yes [provider]  aspirin EC 81 MG EC tablet Take 1 tablet (81 mg total) by mouth daily. 03/31/14  Yes Luiz Blare Y, DO  carvedilol (COREG) 25 MG tablet Take 25 mg by mouth 2 (two) times daily with a meal. 11/23/15  Yes [provider]  cetirizine (ZYRTEC) 10 MG tablet Take 10 mg by mouth daily.   Yes [provider]  cholecalciferol (VITAMIN D3) 25 MCG (1000 UNIT) tablet Take 1,000 Units by mouth daily.   Yes [provider]  cyanocobalamin 1000 MCG tablet Take 1,000 mcg by mouth daily.   Yes [provider]  escitalopram (LEXAPRO) 10 MG tablet Take 10 mg by mouth daily. 03/08/21  Yes [provider]  furosemide (LASIX) 40 MG tablet Take 40 mg by mouth 2 (two) times daily as needed for fluid.   Yes [provider]  gabapentin (NEURONTIN) 300 MG capsule Take 1 capsule (300 mg total) by mouth 2 (two) times daily. Start at 1 cap once daily, and increase to twice daily if she tolerates. Continue 637m at night 08/07/21  Yes FTruitt Merle MD  insulin glargine (LANTUS SOLOSTAR) 100 UNIT/ML Solostar Pen Inject 25 Units into the skin 2 (two) times daily. Patient taking differently: Inject 20 Units into the skin 2 (two) times daily. 06/03/21  Yes MKathie Dike MD  magnesium oxide (MAG-OX) 400 MG tablet Take 400 mg by mouth 2 (two) times daily.   Yes [provider]  MOUNJARO 2.5 MG/0.5ML Pen Inject 2.5 mg into the skin every Thursday. 06/28/21  Yes [provider]  mycophenolate (MYFORTIC) 180 MG EC tablet Take 360 mg by mouth 2 (two) times daily.   Yes [provider]  NOVOLOG FLEXPEN 100 UNIT/ML FlexPen Inject 5 Units into the skin 3 (three) times daily with meals. 04/28/21  Yes [provider]  ondansetron (ZOFRAN) 8 MG tablet Take 1 tablet (8 mg total) by mouth 2 (two) times daily as needed for refractory nausea / vomiting. Start on day 3 after chemo. 05/02/21  Yes FTruitt Merle MD  pantoprazole (PROTONIX) 40 MG tablet Take 40 mg by mouth 2 (two) times daily. 08/02/15  Yes [provider]  predniSONE (DELTASONE) 5 MG tablet  Take 5 mg by mouth daily.  01/04/17  Yes [provider]  rosuvastatin (CRESTOR) 5 MG tablet Take 5 mg by mouth at bedtime.   Yes [provider]  tacrolimus (PROGRAF) 1 MG capsule Take 3 mg by mouth daily. 04/12/21  Yes [provider]  Accu-Chek Softclix Lancets lancets  Use as instructed Patient taking differently: 1 each by Other route See admin instructions. Use as instructed 08/03/20   Renato Shin, MD  Blood Glucose Monitoring Suppl (ACCU-CHEK AVIVA CONNECT) w/Device KIT Check sugar 1x daily Patient taking differently: 1 each by Other route See admin instructions. Check sugar 1x daily 08/03/20   Renato Shin, MD  exemestane (AROMASIN) 25 MG tablet Take 1 tablet (25 mg total) by mouth daily after breakfast. Patient not taking: Reported on 08/17/2021 08/07/21   Truitt Merle, MD  glucose blood (ACCU-CHEK AVIVA PLUS) test strip Check sugar 1x daily Patient taking differently: 1 each by Other route See admin instructions. Check sugar 1x daily 08/03/20   Renato Shin, MD  Insulin Pen Needle (PEN NEEDLES) 30G X 8 MM MISC 1 each by Does not apply route daily. E11.9 09/01/19   Renato Shin, MD    Physical Exam: Vitals:   08/24/21 0428 08/24/21 0855 08/24/21 1031 08/24/21 1209  BP: 137/67 (!) 134/51 (!) 126/53 113/68  Pulse: 82 99 95 90  Resp: '17 17 16 18  ' Temp: 97.6 F (36.4 C) (!) 101.7 F (38.7 C) (!) 102.1 F (38.9 C) 99.2 F (37.3 C)  TempSrc: Oral Oral Oral Axillary  SpO2: 99% 94% 98% 98%  Weight:      Height:       Physical Exam Vitals reviewed.  Constitutional:      Appearance: Normal appearance. She is obese.  HENT:     Head: Normocephalic and atraumatic.     Nose: No congestion.     Mouth/Throat:     Mouth: Mucous membranes are moist.  Eyes:     Extraocular Movements: Extraocular movements intact.     Conjunctiva/sclera: Conjunctivae normal.     Pupils: Pupils are equal, round, and reactive to light.  Cardiovascular:     Rate and Rhythm: Normal rate  and regular rhythm.     Heart sounds: Normal heart sounds.  Pulmonary:     Effort: Pulmonary effort is normal.     Breath sounds: Normal breath sounds. No wheezing, rhonchi or rales.  Abdominal:     General: Bowel sounds are normal. There is no distension.     Palpations: Abdomen is soft.     Tenderness: There is no abdominal tenderness.  Musculoskeletal:        General: Swelling (bilateral hands) present.     Cervical back: Normal range of motion and neck supple.  Skin:    General: Skin is warm.     Findings: No rash.     Comments: Bilateral masectomy with incisions clean and dry. Bilateral drains with 25cc+ of serosanguinous fluid.   Neurological:     General: No focal deficit present.     Mental Status: She is alert and oriented to person, place, and time.  Psychiatric:        Mood and Affect: Mood normal.        Behavior: Behavior normal.     Data Reviewed:   Results are pending, will review when available. See below for resulted.  BUN; 47, creatinine: 2.30,  WBC: 9.7 yesterday (3/1) >8.8. adding on differential.  CXR: hypoventilation, bibasilar atelectasis or pneumonia   Family Communication: husband at bedside: Al Primary team communication: discussed with surgery PA-Kelly plan  Thank you very much for involving [place] in the care of your patient.  Author: Orma Flaming, MD 08/24/2021 4:40 PM  For on call review [email or web address].

## 2021-08-24 NOTE — Progress Notes (Signed)
Inpatient Diabetes Program Recommendations ? ?AACE/ADA: New Consensus Statement on Inpatient Glycemic Control (2015) ? ?Target Ranges:  Prepandial:   less than 140 mg/dL ?     Peak postprandial:   less than 180 mg/dL (1-2 hours) ?     Critically ill patients:  140 - 180 mg/dL  ? ?Lab Results  ?Component Value Date  ? GLUCAP 195 (H) 08/24/2021  ? HGBA1C 7.3 (H) 08/18/2021  ? ? ?Review of Glycemic Control ? Latest Reference Range & Units 08/23/21 08:11 08/23/21 11:24 08/23/21 16:33 08/23/21 23:27 08/24/21 08:51  ?Glucose-Capillary 70 - 99 mg/dL 183 (H) 206 (H) 192 (H) 165 (H) 195 (H)  ? ?Diabetes history: DM 2 ?Outpatient Diabetes medications: Lantus 20 units bid, Munjaro 2.5 QThursday, Novolog 5 units tid meal coverage ?Current orders for Inpatient glycemic control:  ?Semglee 10 units ?Novolog 0-15 units tid  ? ?PO prednisone 5 mg Daily ? ?Inpatient Diabetes Program Recommendations:   ? ?-  Increase Semglee to 12 units ?-  Add Novolog 2 units tid meal coverage if eating >50% of meals. ? ?Thanks, ? ?Tama Headings RN, MSN, BC-ADM ?Inpatient Diabetes Coordinator ?Team Pager [phone] (8a-5p) ?

## 2021-08-24 NOTE — Telephone Encounter (Signed)
error 

## 2021-08-24 NOTE — Progress Notes (Signed)
2 Days Post-Op  ? ?Subjective/Chief Complaint: ?Patient looks and feels better today.  Pains about a 5 out of 10.  Less somnolent.  Creatinine is better down to 2.3 from 2.6.  Nephrology is following.  Urine output is poorly recorded but apparently she voided last night.  She is sore all over she states but feels better than yesterday. ? ? ?Objective: ?Vital signs in last 24 hours: ?Temp:  [97.6 ?F (36.4 ?C)-100.4 ?F (38 ?C)] 97.6 ?F (36.4 ?C) (03/02 0428) ?Pulse Rate:  [82-89] 82 (03/02 0428) ?Resp:  [16-17] 17 (03/02 0428) ?BP: (126-137)/(52-67) 137/67 (03/02 0428) ?SpO2:  [95 %-99 %] 99 % (03/02 0428) ?Last BM Date : 08/21/21 ? ?Intake/Output from previous day: ?03/01 0701 - 03/02 0700 ?In: 1706.1 [I.V.:1706.1] ?Out: 455 [Drains:455] ?Intake/Output this shift: ?No intake/output data recorded. ? ?General appearance: alert and cooperative ?Resp: clear to auscultation bilaterally ?Cardio: Normal sinus rhythm ?Incision/Wound: Wounds are clean dry and intact.  Flaps are viable.  No significant hematoma noted.  Drainage appears serosanguineous from both sides and appropriate. ? ?Lab Results:  ?Recent Labs  ?  08/23/21 ?0214  ?WBC 9.7  ?HGB 10.9*  ?HCT 34.0*  ?PLT 161  ? ?BMET ?Recent Labs  ?  08/23/21 ?1642 08/24/21 ?0418  ?NA 129* 131*  ?K 5.4* 5.0  ?CL 98 98  ?CO2 24 23  ?GLUCOSE 192* 191*  ?BUN 53* 47*  ?CREATININE 2.60* 2.30*  ?CALCIUM 9.2 9.5  ? ?PT/INR ?No results for input(s): LABPROT, INR in the last 72 hours. ?ABG ?No results for input(s): PHART, HCO3 in the last 72 hours. ? ?Invalid input(s): PCO2, PO2 ? ?Studies/Results: ?US Renal Transplant w/Doppler ? ?Result Date: 08/23/2021 ?CLINICAL DATA:  Acute renal insufficiency. EXAM: ULTRASOUND OF RENAL TRANSPLANT WITH RENAL DOPPLER ULTRASOUND TECHNIQUE: Ultrasound examination of the renal transplant was performed with gray-scale, color and duplex doppler evaluation. COMPARISON:  CT abdomen pelvis dated 06/01/2019. FINDINGS: Transplant kidney location: Right lower  quadrant Transplant Kidney: Renal measurements: 11.1 x 4.8 x 4.9 cm = volume: 152mL. Normal in size and parenchymal echogenicity. No evidence of mass or hydronephrosis. No peri-transplant fluid collection seen. Color flow in the main renal artery:  Yes Color flow in the main renal vein:  Yes Duplex Doppler Evaluation: Main Renal Artery Velocity: Not measured. Main Renal Artery Resistive Index: Not measured. Venous waveform in main renal vein:  Present Intrarenal resistive index in upper pole:  0.68 (normal 0.6-0.8; equivocal 0.8-0.9; abnormal >= 0.9) Intrarenal resistive index in lower pole: 0.63 (normal 0.6-0.8; equivocal 0.8-0.9; abnormal >= 0.9) Bladder: Normal for degree of bladder distention. Other findings:  None. IMPRESSION: Unremarkable right lower quadrant renal transplant. Electronically Signed   By: Anner Crete M.D.   On: 08/23/2021 20:16   ? ?Anti-infectives: ?Anti-infectives (From admission, onward)  ? ? Start     Dose/Rate Route Frequency Ordered Stop  ? 08/22/21 1545  ceFAZolin (ANCEF) IVPB 2g/100 mL premix       ? 2 g ?200 mL/hr over 30 Minutes Intravenous Every 8 hours 08/22/21 1544 08/22/21 1700  ? 08/22/21 1000  ceFAZolin (ANCEF) IVPB 2g/100 mL premix       ? 2 g ?200 mL/hr over 30 Minutes Intravenous On call to O.R. 08/22/21 0946 08/22/21 1151  ? ?  ? ? ?Assessment/Plan: ?s/p Procedure(s): ?BILATERAL SIMPLE MASTECTOMY (Bilateral) ?Looks better today ? ?Creatinine is down and potentially can go home from a surgical standpoint later today if okay with nephrology.  Unclear if she will require outpatient follow-up to  check a BMP.  If not, plan on discharge tomorrow as long as creatinine continues to improve.  She has ambulated.  Her potassium is 5.4. ? ?Once nephrology has had a chance to evaluate her today, we can assess if she can go home but overall is surgically stable. ? LOS: 1 day  ? ? ?Marcello Moores A Kasim Mccorkle ?08/24/2021 ? ?

## 2021-08-24 NOTE — Assessment & Plan Note (Addendum)
Improved.  Managed by nephrology. ?

## 2021-08-25 ENCOUNTER — Inpatient Hospital Stay (HOSPITAL_COMMUNITY): Payer: Medicare HMO

## 2021-08-25 DIAGNOSIS — R509 Fever, unspecified: Secondary | ICD-10-CM | POA: Diagnosis not present

## 2021-08-25 LAB — COMPREHENSIVE METABOLIC PANEL
ALT: 6 U/L (ref 0–44)
AST: 14 U/L — ABNORMAL LOW (ref 15–41)
Albumin: 2.9 g/dL — ABNORMAL LOW (ref 3.5–5.0)
Alkaline Phosphatase: 67 U/L (ref 38–126)
Anion gap: 6 (ref 5–15)
BUN: 41 mg/dL — ABNORMAL HIGH (ref 6–20)
CO2: 25 mmol/L (ref 22–32)
Calcium: 9.5 mg/dL (ref 8.9–10.3)
Chloride: 98 mmol/L (ref 98–111)
Creatinine, Ser: 2.09 mg/dL — ABNORMAL HIGH (ref 0.44–1.00)
GFR, Estimated: 28 mL/min — ABNORMAL LOW (ref >60)
Glucose, Bld: 257 mg/dL — ABNORMAL HIGH (ref 70–99)
Potassium: 5.1 mmol/L (ref 3.5–5.1)
Sodium: 129 mmol/L — ABNORMAL LOW (ref 135–145)
Total Bilirubin: 0.5 mg/dL (ref 0.3–1.2)
Total Protein: 5.2 g/dL — ABNORMAL LOW (ref 6.5–8.1)

## 2021-08-25 LAB — CBC WITH DIFFERENTIAL/PLATELET
Abs Immature Granulocytes: 0.04 10*3/uL (ref 0.00–0.07)
Basophils Absolute: 0 10*3/uL (ref 0.0–0.1)
Basophils Relative: 0 %
Eosinophils Absolute: 0.1 10*3/uL (ref 0.0–0.5)
Eosinophils Relative: 2 %
HCT: 27.6 % — ABNORMAL LOW (ref 36.0–46.0)
Hemoglobin: 9.2 g/dL — ABNORMAL LOW (ref 12.0–15.0)
Immature Granulocytes: 1 %
Lymphocytes Relative: 9 %
Lymphs Abs: 0.7 10*3/uL (ref 0.7–4.0)
MCH: 31.7 pg (ref 26.0–34.0)
MCHC: 33.3 g/dL (ref 30.0–36.0)
MCV: 95.2 fL (ref 80.0–100.0)
Monocytes Absolute: 0.6 10*3/uL (ref 0.1–1.0)
Monocytes Relative: 7 %
Neutro Abs: 6.5 10*3/uL (ref 1.7–7.7)
Neutrophils Relative %: 81 %
Platelets: 131 10*3/uL — ABNORMAL LOW (ref 150–400)
RBC: 2.9 MIL/uL — ABNORMAL LOW (ref 3.87–5.11)
RDW: 12.8 % (ref 11.5–15.5)
WBC: 8 10*3/uL (ref 4.0–10.5)
nRBC: 0 % (ref 0.0–0.2)

## 2021-08-25 LAB — GLUCOSE, CAPILLARY: Glucose-Capillary: 226 mg/dL — ABNORMAL HIGH (ref 70–99)

## 2021-08-25 MED ORDER — OXYCODONE HCL 5 MG PO TABS: 5.0000 mg | ORAL_TABLET | Freq: Four times a day (QID) | ORAL | 0 refills | Status: DC | PRN

## 2021-08-25 NOTE — Hospital Course (Signed)
51 y.o. female with past medical history of renal transplant on immunosuppressants, CKD stage IIIa, insulin-dependent diabetes mellitus, hypertension, hyperlipidemia, breast cancer status post left lumpectomy and chemotherapy (last chemo 07/14/21 and completed) who was recently admitted on 08/22/2021 by Dr. Brantley Stage for bilateral mastectomy for BRCA 2+, to lessen future malignancy risks.  She developed fever on postop day 2.  Hospitalist was consulted for management.   ?

## 2021-08-25 NOTE — Plan of Care (Signed)
  Problem: Education: Goal: Knowledge of General Education information will improve Description Including pain rating scale, medication(s)/side effects and non-pharmacologic comfort measures Outcome: Progressing   Problem: Health Behavior/Discharge Planning: Goal: Ability to manage health-related needs will improve Outcome: Progressing   

## 2021-08-25 NOTE — Plan of Care (Signed)
?  Problem: Education: ?Goal: Knowledge of General Education information will improve ?Description: Including pain rating scale, medication(s)/side effects and non-pharmacologic comfort measures ?08/25/2021 0936 by Santa Lighter, RN ?Outcome: Adequate for Discharge ?08/25/2021 0935 by Santa Lighter, RN ?Outcome: Progressing ?  ?Problem: Health Behavior/Discharge Planning: ?Goal: Ability to manage health-related needs will improve ?08/25/2021 0936 by Santa Lighter, RN ?Outcome: Adequate for Discharge ?08/25/2021 0935 by Santa Lighter, RN ?Outcome: Progressing ?  ?Problem: Clinical Measurements: ?Goal: Ability to maintain clinical measurements within normal limits will improve ?Outcome: Adequate for Discharge ?Goal: Will remain free from infection ?Outcome: Adequate for Discharge ?Goal: Diagnostic test results will improve ?Outcome: Adequate for Discharge ?Goal: Respiratory complications will improve ?Outcome: Adequate for Discharge ?Goal: Cardiovascular complication will be avoided ?Outcome: Adequate for Discharge ?  ?Problem: Activity: ?Goal: Risk for activity intolerance will decrease ?Outcome: Adequate for Discharge ?  ?Problem: Nutrition: ?Goal: Adequate nutrition will be maintained ?Outcome: Adequate for Discharge ?  ?Problem: Coping: ?Goal: Level of anxiety will decrease ?Outcome: Adequate for Discharge ?  ?Problem: Elimination: ?Goal: Will not experience complications related to bowel motility ?Outcome: Adequate for Discharge ?Goal: Will not experience complications related to urinary retention ?Outcome: Adequate for Discharge ?  ?Problem: Pain Managment: ?Goal: General experience of comfort will improve ?Outcome: Adequate for Discharge ?  ?Problem: Education: ?Goal: Knowledge of disease or condition will improve ?Outcome: Adequate for Discharge ?  ?Problem: Activity: ?Goal: Ability to maintain or regain function will improve ?Outcome: Adequate for Discharge ?  ?Problem: Clinical Measurements: ?Goal: Postoperative  complications will be avoided or minimized ?Outcome: Adequate for Discharge ?  ?Problem: Self-Concept: ?Goal: Ability to verbalize positive feelings about self will improve ?Outcome: Adequate for Discharge ?  ?Problem: Pain Management: ?Goal: Expressions of feelings of enhanced comfort will increase ?Outcome: Adequate for Discharge ?  ?Problem: Skin Integrity: ?Goal: Demonstration of wound healing without infection will improve ?Outcome: Adequate for Discharge ?  ?Problem: Increased Nutrient Needs (NI-5.1) ?Goal: Food and/or nutrient delivery ?Description: Individualized approach for food/nutrient provision. ?Outcome: Adequate for Discharge ?  ?

## 2021-08-25 NOTE — Progress Notes (Signed)
[facility] PROGRESS NOTE   Cynthia [name] YQM:[id number] DOB: [birth year] DOA: 08/22/2021  2 DOS: the patient was seen and examined on 08/25/2021  PCP: Arthur Holms, NP  Brief History and Hospital Course:  51 y.o. female with past medical history of renal transplant on immunosuppressants, CKD stage IIIa, insulin-dependent diabetes mellitus, hypertension, hyperlipidemia, breast cancer status post left lumpectomy and chemotherapy (last chemo 07/14/21 and completed) who was recently admitted on 08/22/2021 by Dr. Brantley Stage for bilateral mastectomy for BRCA 2+, to lessen future malignancy risks.  She developed fever on postop day 2.  Hospitalist was consulted for management.     Subjective: Patient mildly somnolent but easily arousable.  Denies any cough shortness of breath.  Husband was at the bedside who was able to interpret.    Assessment/Plan:   Fever Patient developed fever on postop day 2 after bilateral mastectomy for breast cancer and +BRCA.  No clear etiology for fever has been identified.  UA was negative.  No skin rashes noted.  No GI symptoms such as nausea vomiting or diarrhea. Chest x-ray did raise possibility of infiltrate versus atelectasis the patient denies any respiratory symptoms including cough shortness of breath.  She was mobilized and placed on incentive spirometer.  WBC was normal.  If she was not given any antibiotics. Fever has subsided.  Chest x-ray from this morning showed similar findings.  Discussed with Dr. Brantley Stage with general surgery.  Plan is for discharge today which is reasonable considering resolution of fever and overall stability.   AKI (acute kidney injury/renal transplant on immunosuppresants  Improved.  Managed by nephrology.  HTN (hypertension) Stable.  Continue home medications at discharge.  Insulin dependent type 2 diabetes mellitus (HCC) Recent a1c of 7.3.  Continue home medications.   Morbid obesity Estimated body mass index is  41.95 kg/m as calculated from the following:   Height as of this encounter: _0  (1.549 m).   Weight as of this encounter: 100.7 kg.        Medications: Scheduled:  carvedilol  25 mg Oral BID WC   enoxaparin (LOVENOX) injection  40 mg Subcutaneous Q24H   escitalopram  10 mg Oral Daily   exemestane  25 mg Oral QPC breakfast   feeding supplement (GLUCERNA SHAKE)  237 mL Oral TID BM   gabapentin  300 mg Oral BID   insulin aspart  0-15 Units Subcutaneous TID WC   insulin glargine-yfgn  12 Units Subcutaneous Daily   loratadine  10 mg Oral Daily   multivitamin with minerals  1 tablet Oral Daily   mycophenolate  360 mg Oral BID   pantoprazole  40 mg Oral BID   predniSONE  5 mg Oral Daily   rosuvastatin  5 mg Oral QHS   tacrolimus  3 mg Oral Daily   Continuous: BMW:[id number], furosemide, HYDROmorphone (DILAUDID) injection, metoprolol tartrate, ondansetron **OR** ondansetron (ZOFRAN) IV, oxyCODONE, simethicone, zolpidem  Antibiotics: Anti-infectives (From admission, onward)    Start     Dose/Rate Route Frequency Ordered Stop   08/22/21 1545  ceFAZolin (ANCEF) IVPB 2g/100 mL premix        2 g 200 mL/hr over 30 Minutes Intravenous Every 8 hours 08/22/21 1544 08/22/21 1700   08/22/21 1000  ceFAZolin (ANCEF) IVPB 2g/100 mL premix        2 g 200 mL/hr over 30 Minutes Intravenous On call to O.R. 08/22/21 0946 08/22/21 1151       Objective:  Vital Signs  Vitals:   08/24/21 2021 08/25/21  0019 08/25/21 0400 08/25/21 0911  BP: 140/60 (!) 153/58 (!) 149/55 (!) 146/62  Pulse: 80 88 98 89  Resp: _0 Temp: 98.4 F (36.9 C) 98.3 F (36.8 C) (!) 97 F (36.1 C) 98.8 F (37.1 C)  TempSrc: Oral Oral Oral Oral  SpO2: 97% 95% 95% 98%  Weight:      Height:        Intake/Output Summary (Last 24 hours) at 08/25/2021 1038 Last data filed at 08/25/2021 0929 Gross per 24 hour  Intake --  Output 240 ml  Net -240 ml   Filed Weights   08/22/21 0926  Weight: 100.7 kg     General appearance: Awake alert.  In no distress Resp: Clear to auscultation bilaterally.  Normal effort Cardio: S1-S2 is normal regular.  No S3-S4.  No rubs murmurs or bruit GI: Abdomen is soft.  Nontender nondistended.  Bowel sounds are present normal.  No masses organomegaly    Lab Results:  Data Reviewed: I have personally reviewed labs and imaging study reports  CBC: Recent Labs  Lab 08/18/21 1059 08/23/21 0214 08/24/21 1417 08/24/21 1635 08/25/21 0124  WBC 6.0 9.7 8.7   8.8 DUPL 8.0  NEUTROABS 4.3  --  7.3 PENDING 6.5  HGB 10.8* 10.9* 9.3*   9.1* DUPL 9.2*  HCT 32.4* 34.0* 27.9*   27.7* DUPL 27.6*  MCV 95.0 98.3 96.9   96.5 DUPL 95.2  PLT 174 161 130*   125* DUPL 131*    Basic Metabolic Panel: Recent Labs  Lab 08/18/21 1059 08/23/21 0830 08/23/21 1642 08/24/21 0418 08/25/21 0124  NA 137 133* 129* 131* 129*  K 4.7 5.8* 5.4* 5.0 5.1  CL 104 98 98 98 98  CO2 _1 GLUCOSE 207* 175* 192* 191* 257*  BUN 32* 48* 53* 47* 41*  CREATININE 1.34* 2.59* 2.60* 2.30* 2.09*  CALCIUM 10.2 9.4 9.2 9.5 9.5    GFR: Estimated Creatinine Clearance: 34.3 mL/min (A) (by C-G formula based on SCr of 2.09 mg/dL (H)).  Liver Function Tests: Recent Labs  Lab 08/18/21 1059 08/23/21 0830 08/24/21 0418 08/25/21 0124  AST 14* 39 20 14*  ALT _2 ALKPHOS 73 74 68 67  BILITOT 0.3 0.4 0.6 0.5  PROT 6.1* 5.9* 5.3* 5.2*  ALBUMIN 4.0 3.7 3.2* 2.9*     Cardiac Enzymes: Recent Labs  Lab 08/23/21 2015  CKTOTAL 392*     CBG: Recent Labs  Lab 08/24/21 0851 08/24/21 1220 08/24/21 1730 08/24/21 2032 08/25/21 0908  GLUCAP 195* 304* 259* 254* 226*     Recent Results (from the past 240 hour(s))  SARS CORONAVIRUS 2 (TAT 6-24 HRS) Nasopharyngeal Nasopharyngeal Swab     Status: None   Collection Time: 08/18/21  4:28 PM   Specimen: Nasopharyngeal Swab  Result Value Ref Range Status   SARS Coronavirus 2 NEGATIVE NEGATIVE Final    Comment:  (NOTE) SARS-CoV-2 target nucleic acids are NOT DETECTED.  The SARS-CoV-2 RNA is generally detectable in upper and lower respiratory specimens during the acute phase of infection. Negative results do not preclude SARS-CoV-2 infection, do not rule out co-infections with other pathogens, and should not be used as the sole basis for treatment or other patient management decisions. Negative results must be combined with clinical observations, patient history, and epidemiological information. The expected result is Negative.  Fact Sheet for Patients: SugarRoll.be  Fact Sheet for Healthcare Providers: https://www.woods-mathews.com/  This test is not yet approved  or cleared by the [place] and  has been authorized for detection and/or diagnosis of SARS-CoV-2 by FDA under an Emergency Use Authorization (EUA). This EUA will remain  in effect (meaning this test can be used) for the duration of the COVID-19 declaration under Se ction 564(b)(1) of the Act, 21 U.S.C. section 360bbb-3(b)(1), unless the authorization is terminated or revoked sooner.  Performed at [facility], [place] [address], [place], [place] [address]   Culture, blood (routine x 2)     Status: None (Preliminary result)   Collection Time: 08/24/21  2:17 PM   Specimen: BLOOD  Result Value Ref Range Status   Specimen Description BLOOD RIGHT ANTECUBITAL  Final   Special Requests   Final    BOTTLES DRAWN AEROBIC AND ANAEROBIC Blood Culture adequate volume   Culture   Final    NO GROWTH < 24 HOURS Performed at [facility], [place] [address], [place], [place] [address]    Report Status PENDING  Incomplete  Culture, blood (routine x 2)     Status: None (Preliminary result)   Collection Time: 08/24/21  2:19 PM   Specimen: BLOOD  Result Value Ref Range Status   Specimen Description BLOOD RIGHT ANTECUBITAL  Final   Special Requests   Final    BOTTLES DRAWN AEROBIC AND  ANAEROBIC Blood Culture adequate volume   Culture   Final    NO GROWTH < 24 HOURS Performed at [facility], [place] [address], [place], [place] [address]    Report Status PENDING  Incomplete      Radiology Studies: DG Chest 2 View  Result Date: 08/25/2021 CLINICAL DATA:  Fever EXAM: CHEST - 2 VIEW COMPARISON:  08/24/2021 FINDINGS: Enlargement of cardiac silhouette with pulmonary vascular congestion. Decreased lung volumes with bibasilar infiltrates again seen. Increased perihilar markings as well. No pleural effusion or pneumothorax. IMPRESSION: Enlargement of cardiac silhouette with pulmonary vascular congestion and increased perihilar markings, cannot exclude mild pulmonary edema. Bibasilar infiltrates greater on RIGHT question pneumonia. Electronically Signed   By: Lavonia Dana M.D.   On: 08/25/2021 08:37   US Renal Transplant w/Doppler  Result Date: 08/23/2021 CLINICAL DATA:  Acute renal insufficiency. EXAM: ULTRASOUND OF RENAL TRANSPLANT WITH RENAL DOPPLER ULTRASOUND TECHNIQUE: Ultrasound examination of the renal transplant was performed with gray-scale, color and duplex doppler evaluation. COMPARISON:  CT abdomen pelvis dated 06/01/2019. FINDINGS: Transplant kidney location: Right lower quadrant Transplant Kidney: Renal measurements: 11.1 x 4.8 x 4.9 cm = volume: 163m. Normal in size and parenchymal echogenicity. No evidence of mass or hydronephrosis. No peri-transplant fluid collection seen. Color flow in the main renal artery:  Yes Color flow in the main renal vein:  Yes Duplex Doppler Evaluation: Main Renal Artery Velocity: Not measured. Main Renal Artery Resistive Index: Not measured. Venous waveform in main renal vein:  Present Intrarenal resistive index in upper pole:  0.68 (normal 0.6-0.8; equivocal 0.8-0.9; abnormal >= 0.9) Intrarenal resistive index in lower pole: 0.63 (normal 0.6-0.8; equivocal 0.8-0.9; abnormal >= 0.9) Bladder: Normal for degree of bladder distention. Other  findings:  None. IMPRESSION: Unremarkable right lower quadrant renal transplant. Electronically Signed   By: AAnner CreteM.D.   On: 08/23/2021 20:16   DG Chest Port 1 View  Result Date: 08/24/2021 CLINICAL DATA:  Fever and weakness EXAM: PORTABLE CHEST 1 VIEW COMPARISON:  05/28/2021 FINDINGS: Hypoventilation with decreased lung volume. Bibasilar infiltrates have developed since the prior study. Upper lobes clear. No pleural effusion. IMPRESSION: Hypoventilation.  Bibasilar atelectasis or pneumonia. Electronically Signed   By: Franchot Gallo M.D.   On: 08/24/2021 11:27       LOS: 2 days   [facility] Pager on [email or web address]  08/25/2021, 10:38 AM

## 2021-08-25 NOTE — Discharge Summary (Signed)
Physician Discharge Summary  ?Patient ID: ?Cynthia [name] ?MRN: [id number] ?DOB/AGE: 1970-12-17 51 y.o. ? ?Admit date: 08/22/2021 ?Discharge date: 08/25/2021 ? ?Admission Diagnoses: BRCA 2 history of breast cancer  ? ?Discharge Diagnoses:  ?Principal Problem: ?  BRCA2 gene mutation positive s/p bilateral masectomy  ?Active Problems: ?  HTN (hypertension) ?  Insulin dependent type 2 diabetes mellitus ([place]) ?  Fever ?  AKI (acute kidney injury/renal transplant on immunosuppresants  ? ? ?Discharged Condition: good ? ?Hospital Course: Pt admitted after bilateral mastectomy due to AKI and pain control issues.  Her Cr jumped to 2.6 from 1.3 baseline. Nephrology consulted due to complex renal history. Her Cr improved to 2.09 on POD 4 and she had good urine output. She also developed post op fever. Medicine consulted due to complex medical problems.  CXR showed atelectasis vs pneumonia but she had a normal WBC and procalcitonin. Her potassium was elevated but this improved with fluids and lasix.  Nephology felt she was stable from a renal standpoint.  Case discussed with medicine as well and they felt she was stable for discharge from a medical standpoint. Her fevers resolved with mobilization and pulmonary toilet.  She was mildly somnolent but appropriate.  Her workup found no cause for this and was felt secondary to pain management/ narcotics sine her pain was poorly controlled initially.  Discussed care with husband and discussed signs that he should watch for to include slurred speech. Facial droop, CP, worsening SOB loss of coordination / speech etc. She was ambulating with PT and a walker. She had a BM and tolerated her diet. I discussed limiting her pain meds at this point.  She will contact her nephrologist at discharge for follow up and resume all of her home medications/diet.  DM managed medically and placed back on all home medications.  ? ?Consults: nephrology and medicine  ? ?Significant Diagnostic Studies: labs:  CBC    ?CMP Latest Ref Rng & Units 08/25/2021 08/24/2021 08/23/2021  ?Glucose 70 - 99 mg/dL 257(H) 191(H) 192(H)  ?BUN 6 - 20 mg/dL 41(H) 47(H) 53(H)  ?Creatinine 0.44 - 1.00 mg/dL 2.09(H) 2.30(H) 2.60(H)  ?Sodium 135 - 145 mmol/L 129(L) 131(L) 129(L)  ?Potassium 3.5 - 5.1 mmol/L 5.1 5.0 5.4(H)  ?Chloride 98 - 111 mmol/L 98 98 98  ?CO2 22 - 32 mmol/L '25 23 24  ' ?Calcium 8.9 - 10.3 mg/dL 9.5 9.5 9.2  ?Total Protein 6.5 - 8.1 g/dL 5.2(L) 5.3(L) -  ?Total Bilirubin 0.3 - 1.2 mg/dL 0.5 0.6 -  ?Alkaline Phos 38 - 126 U/L 67 68 -  ?AST 15 - 41 U/L 14(L) 20 -  ?ALT 0 - 44 U/L 6 6 -  ?  ?   ?Component Value Date/Time  ? WBC 8.0 08/25/2021 0124  ? RBC 2.90 (L) 08/25/2021 0124  ? HGB 9.2 (L) 08/25/2021 0124  ? HGB 10.8 (L) 08/18/2021 1059  ? HGB 13.5 02/27/2019 1152  ? HCT 27.6 (L) 08/25/2021 0124  ? HCT 39.5 02/27/2019 1152  ? PLT 131 (L) 08/25/2021 0124  ? PLT 174 08/18/2021 1059  ? PLT 186 02/27/2019 1152  ? MCV 95.2 08/25/2021 0124  ? MCV 94 02/27/2019 1152  ? MCH 31.7 08/25/2021 0124  ? MCHC 33.3 08/25/2021 0124  ? RDW 12.8 08/25/2021 0124  ? RDW 12.2 02/27/2019 1152  ? LYMPHSABS 0.7 08/25/2021 0124  ? LYMPHSABS 1.7 02/27/2019 1152  ? MONOABS 0.6 08/25/2021 0124  ? EOSABS 0.1 08/25/2021 0124  ? EOSABS 0.1 02/27/2019 1152  ?  BASOSABS 0.0 08/25/2021 0124  ? BASOSABS 0.0 02/27/2019 1152  ?  and radiology: CXR: atelectasis bilaterally ? ?Treatments: IV hydration, analgesia: acetaminophen and Dilaudid, respiratory therapy: O2, and surgery: bilateral mastectomy  ? ?Discharge Exam: ?Blood pressure (!) 149/55, pulse 98, temperature (!) 97 ?F (36.1 ?C), temperature source Oral, resp. rate 17, height '5\' 1"'  (1.549 m), weight 100.7 kg, SpO2 95 %. ?General appearance: alert, cooperative, and slowed mentation ?Resp: clear to auscultation bilaterally ?Cardio: NSR ?Incision/Wound: ?WOUNDS CDI SKIN VIABLE NO HEMATOMA JP SEROSANGUINOUS  NO CELLULITIS  ?Total body edema noted  1 plus  ?Disposition: Discharge disposition: 01-Home or Self  Care ? ? ? ? ? ? ?Discharge Instructions   ? ? Diet - low sodium heart healthy   Complete by: As directed ?  ? Increase activity slowly   Complete by: As directed ?  ? ?  ? ? ? ? ?Signed: ?Turner Daniels MD  ?08/25/2021, 8:11 AM ? ? ?

## 2021-08-29 ENCOUNTER — Encounter: Payer: Medicare HMO | Admitting: Rehabilitation

## 2021-08-29 ENCOUNTER — Encounter: Payer: Self-pay | Admitting: *Deleted

## 2021-08-29 DIAGNOSIS — C50312 Malignant neoplasm of lower-inner quadrant of left female breast: Secondary | ICD-10-CM

## 2021-08-29 LAB — CULTURE, BLOOD (ROUTINE X 2)
Culture: NO GROWTH
Culture: NO GROWTH
Special Requests: ADEQUATE
Special Requests: ADEQUATE

## 2021-08-29 NOTE — Telephone Encounter (Signed)
Error

## 2021-08-29 NOTE — Telephone Encounter (Signed)
[facility] regarding the referral; [facility] office called and left the patient a message to call their office back.  ? ?Called and spoke with the patient and her husband; gave the phone number for [facility] along with instructions to call that office for an appt.  ? ?Per patient's husband's request send this information thru a my chart message  ?

## 2021-08-30 ENCOUNTER — Encounter: Payer: Self-pay | Admitting: Hematology

## 2021-08-31 ENCOUNTER — Encounter: Payer: Medicare HMO | Admitting: Rehabilitation

## 2021-08-31 ENCOUNTER — Telehealth: Payer: Self-pay

## 2021-08-31 NOTE — Telephone Encounter (Signed)
Received call from patients husband. He had a missed call from [place] and was unsure what it was. Advised that it may have been an appointment reminder for patients ultrasound tomorrow. Patient is to arrive at [facility] by 10:30 am with a full bladder. He verbalized understanding.  ?Instructed that our office has information regarding patients upcoming surgery to review with the patient. He states "I am getting ready to go to work. Can you please call us tomorrow after her appointment?" ?Advised him that our office will call tomorrow after her appointment.  ?

## 2021-09-01 ENCOUNTER — Ambulatory Visit (HOSPITAL_COMMUNITY): Admission: RE | Admit: 2021-09-01 | Payer: Medicare HMO | Source: Ambulatory Visit

## 2021-09-01 ENCOUNTER — Telehealth: Payer: Self-pay | Admitting: *Deleted

## 2021-09-01 NOTE — Telephone Encounter (Signed)
Spoke with pt this afternoon regarding her surgery scheduled with the assistant of [place].  Pt gave phone to her husband to take down information. Informed pt's husband that pt's surgery is scheduled for April 25th, 2023 at [facility]  We will discuss the procedure in detail when she comes in for her preop appointment with Lenna Sciara, NP on 10/03/21 @ 11am.  Pt may also receive a phone call from the hospital to arrange for a perioperative appointment as well which should be after her appointment with our office. This date is subject to change but we wanted to hold a spot for surgery.  Husband verbalized understanding.  ?

## 2021-09-01 NOTE — Telephone Encounter (Signed)
Attempted to reach the patient's husband and left a message to call the office back. Need to check with him regarding her referral to [facility] ?

## 2021-09-04 ENCOUNTER — Ambulatory Visit: Payer: Medicare HMO

## 2021-09-05 ENCOUNTER — Encounter: Payer: Medicare HMO | Admitting: Rehabilitation

## 2021-09-05 ENCOUNTER — Telehealth: Payer: Self-pay | Admitting: *Deleted

## 2021-09-05 NOTE — Telephone Encounter (Signed)
Attempted to reach the patient's husband and left a message to call the office back. Need to check with him regarding her referral to [facility] ?

## 2021-09-05 NOTE — Telephone Encounter (Signed)
Per patient's husbands request rescheduled the [place] from 2 pm to 3 pm. Called and LMOM  ?

## 2021-09-05 NOTE — Telephone Encounter (Signed)
Per patient request rescheduled [place] to 3/20 at 2 pm. [facility] for the patient  ?

## 2021-09-07 ENCOUNTER — Encounter: Payer: Medicare HMO | Admitting: Rehabilitation

## 2021-09-11 ENCOUNTER — Other Ambulatory Visit: Payer: Self-pay

## 2021-09-11 ENCOUNTER — Ambulatory Visit (HOSPITAL_COMMUNITY)
Admission: RE | Admit: 2021-09-11 | Discharge: 2021-09-11 | Disposition: A | Discharge status: Home / Self Care | Payer: Medicare HMO | Source: Ambulatory Visit | Attending: Gynecologic Oncology | Admitting: Gynecologic Oncology

## 2021-09-11 DIAGNOSIS — Z1502 Genetic susceptibility to malignant neoplasm of ovary: Secondary | ICD-10-CM | POA: Diagnosis present

## 2021-09-11 DIAGNOSIS — Z1501 Genetic susceptibility to malignant neoplasm of breast: Secondary | ICD-10-CM | POA: Insufficient documentation

## 2021-09-11 DIAGNOSIS — Z1509 Genetic susceptibility to other malignant neoplasm: Secondary | ICD-10-CM | POA: Diagnosis present

## 2021-09-12 ENCOUNTER — Ambulatory Visit: Payer: Medicare HMO | Attending: Hematology | Admitting: Rehabilitation

## 2021-09-12 ENCOUNTER — Encounter: Payer: Self-pay | Admitting: Hematology

## 2021-09-12 ENCOUNTER — Encounter: Payer: Self-pay | Admitting: Rehabilitation

## 2021-09-12 DIAGNOSIS — Z170 Estrogen receptor positive status [ER+]: Secondary | ICD-10-CM | POA: Diagnosis present

## 2021-09-12 DIAGNOSIS — Z483 Aftercare following surgery for neoplasm: Secondary | ICD-10-CM | POA: Insufficient documentation

## 2021-09-12 DIAGNOSIS — M6281 Muscle weakness (generalized): Secondary | ICD-10-CM | POA: Diagnosis present

## 2021-09-12 DIAGNOSIS — C50312 Malignant neoplasm of lower-inner quadrant of left female breast: Secondary | ICD-10-CM | POA: Diagnosis present

## 2021-09-12 DIAGNOSIS — R293 Abnormal posture: Secondary | ICD-10-CM | POA: Insufficient documentation

## 2021-09-12 NOTE — Therapy (Signed)
?OUTPATIENT PHYSICAL THERAPY TREATMENT NOTE ? ? ?Patient Name: Cynthia [name] ?MRN: [id number] ?DOB:06/29/1970, 51 y.o., female ?Today's Date: 09/12/2021 ? ?PCP: Arthur Holms, NP ?REFERRING PROVIDER: Truitt Merle, MD ? ? PT End of Session - 09/12/21 1254   ? ? Visit Number 5   ? Number of Visits 19   ? Date for PT Re-Evaluation 10/09/21   ? Authorization Type ok12 visits 08/14/21-10/09/21   ? Authorization - Visit Number 5   ? Authorization - Number of Visits 12   ? PT Start Time 1304   ? PT Stop Time 1335   ? PT Time Calculation (min) 31 min   ? Activity Tolerance Patient limited by pain;Treatment limited secondary to medical complications (Comment)   ? Behavior During Therapy [facility] for tasks assessed/performed   ? ?  ?  ? ?  ? ? ?Past Medical History:  ?Diagnosis Date  ? Allergy   ? pollen  ? Anemia   ? Anxiety   ? BRCA2 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 03/01/2021  ? Breast cancer ([place])   ? Cataract   ? surgical repair bilateral  ? Coronary artery disease   ? Depression   ? ESRD on hemodialysis ([place])   ? Home HD 5x per week- not on dialysis now had tramsplant 4/17  ? Family history of ovarian cancer 02/22/2021  ? GERD (gastroesophageal reflux disease)   ? Hearing loss 2017  ? right ear  ? History of blood transfusion   ? transfusion reaction  ? Hyperlipidemia   ? Hypertension   ? Insulin-dependent diabetes mellitus with renal complications   ? Type I beginning now type II per pt-dr levy also II; Per patient 08/18/21 she is Type 2  ? Kidney transplant recipient   ? Neuromuscular disorder ([place])   ? NEUROPATHY  ? Sleep apnea   ? ?Past Surgical History:  ?Procedure Laterality Date  ? ABDOMINAL HYSTERECTOMY    ? BREAST EXCISIONAL BIOPSY Right 08/2015  ? BREAST LUMPECTOMY WITH RADIOACTIVE SEED LOCALIZATION Right 09/14/2015  ? Procedure: RIGHT BREAST LUMPECTOMY WITH RADIOACTIVE SEED LOCALIZATION;  Surgeon: Donnie Mesa, MD;  Location: [place];  Service: General;  Laterality: Right;  ? BREAST LUMPECTOMY WITH  RADIOACTIVE SEED LOCALIZATION Left 03/02/2021  ? Procedure: LEFT BREAST SEED LUMPECTOMY LEFT SENTINEL LYMPH NODE MAPPING;  Surgeon: Erroll Luna, MD;  Location: [place];  Service: General;  Laterality: Left;  GEN AND PEC BLOCK  ? BREAST SURGERY Bilateral   ? biopsy bilateral  ? CARDIAC CATHETERIZATION    ? CATARACT EXTRACTION Bilateral   ? bilateral  ? CHOLECYSTECTOMY    ? CYST REMOVAL NECK    ? DIALYSIS FISTULA CREATION Left   ? EYE SURGERY Bilateral   ? lazer  ? kidney transplant    ? LEFT HEART CATH AND CORONARY ANGIOGRAPHY N/A 03/04/2019  ? Procedure: LEFT HEART CATH AND CORONARY ANGIOGRAPHY;  Surgeon: Martinique, Peter M, MD;  Location: [place] [facility];  Service: Cardiovascular;  Laterality: N/A;  ? LEFT HEART CATHETERIZATION WITH CORONARY ANGIOGRAM N/A 03/30/2014  ? Procedure: LEFT HEART CATHETERIZATION WITH CORONARY ANGIOGRAM;  Surgeon: Sinclair Grooms, MD;  Location: [facility] CATH [facility];  Service: Cardiovascular;  Laterality: N/A;  ? LIPOMA EXCISION N/A 02/06/2017  ? Procedure: EXCISION POSTERIOR NECK SEBACEOUS CYST;  Surgeon: Coralie Keens, MD;  Location: [place];  Service: General;  Laterality: N/A;  ? 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  ? Procedure: REPAIR OF ARTERIOVENOUS FISTULA ANEURYSM;  Surgeon: Durene Fruits  Pierre Bali, MD;  Location: [facility];  Service: Vascular;  Laterality: Left;  ? REVISON OF ARTERIOVENOUS FISTULA Left 09/28/2013  ? Procedure: EXCISE ESCHAR LEFT ARM  ARTERIOVENOUS FISTULA WITH PLICATION OF LEFT ARM ARTERIOVENOUS FISTULA;  Surgeon: Elam Dutch, MD;  Location: [place];  Service: Vascular;  Laterality: Left;  ? REVISON OF ARTERIOVENOUS FISTULA Left 08/26/2015  ? Procedure: RESECTION ANEURYSM OF LEFT ARM ARTERIOVENOUS FISTULA  ;  Surgeon: Serafina Mitchell, MD;  Location: [place];  Service: Vascular;  Laterality: Left;  ? SIMPLE MASTECTOMY WITH AXILLARY SENTINEL NODE BIOPSY Bilateral 08/22/2021  ? Procedure: BILATERAL SIMPLE MASTECTOMY;  Surgeon: Erroll Luna, MD;  Location: [place];  Service: General;  Laterality: Bilateral;  ? TUBAL LIGATION    ? ?Patient Active Problem List  ? Diagnosis Date Noted  ? Fever 08/24/2021  ? AKI (acute kidney injury/renal transplant on immunosuppresants  08/24/2021  ? BRCA2 gene mutation positive s/p bilateral masectomy  08/22/2021  ? Port-A-Cath in place 07/12/2021  ? Hyponatremia 07/03/2021  ? UTI (urinary tract infection) 07/02/2021  ? Leukocytosis 06/13/2021  ? Complicated UTI (urinary tract infection) 06/13/2021  ? PICC (peripherally inserted central catheter) in place 06/13/2021  ? Stage 3a chronic kidney disease (CKD) ([place]) 05/28/2021  ? Thrombocytopenia ([place]) 05/28/2021  ? Aortic atherosclerosis ([place]) 03/07/2021  ? Hypercholesterolemia 03/07/2021  ? BRCA2 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 03/01/2021  ? Genetic testing 03/01/2021  ? Family history of ovarian cancer 02/22/2021  ? Malignant neoplasm of lower-inner quadrant of left breast in female, estrogen receptor positive ([place]) 02/15/2021  ? Foot pain, bilateral 07/28/2020  ? COVID-19 virus infection 06/16/2019  ? Insulin dependent type 2 diabetes mellitus ([place]) 06/06/2019  ? Hyperglycemia 06/02/2019  ? DKA (diabetic ketoacidoses) 06/02/2019  ? HTN (hypertension) 03/04/2019  ? Abnormal nuclear stress test 03/04/2019  ? Diabetic nephropathy ([place]) 03/03/2019  ? History of renal transplant 11/18/2015  ? Diabetic retinopathy associated with type 2 diabetes mellitus ([place]) 04/13/2015  ? Postprandial vomiting   ? 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  ? Anemia 01/04/2015  ? Pseudoaneurysm of arteriovenous dialysis fistula ([place]) 03/16/2014  ? Hyperkalemia 11/16/2013  ? Chronic constipation 07/28/2013  ? Paresthesias 07/28/2013  ? Diabetes mellitus type I ([place])   ? Kidney transplant status 05/28/2013  ? 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 Precordial chest pain 02/10/2013  ? Awaiting organ transplant 09/19/2012  ? ? ?REFERRING DIAG: neuropathy and breast cancer ? ?THERAPY DIAG:  ?Muscle weakness  (generalized) ? ?Aftercare following surgery for neoplasm ? ?Abnormal posture ? ?Malignant neoplasm of lower-inner quadrant of left breast in female, estrogen receptor positive ([place]) ? ?PERTINENT HISTORY: PERTINENT HISTORY:  ?Left breast cancer s/p left lumpectomy 03/02/21 due to grade 2 IDC with negative LN.  Due to high risk oncotype pt started adjuvant TC after delayed wound healing. bil mastectomy with Dr. Brantley Stage 08/22/21. On gabapentin for diabetic neuropathy and long term steroids for kidney transplant. Other history includes: CAD, type 1 DM, HTN, s/p kidney transplant 2017.  ? ?PRECAUTIONS: PRECAUTIONS: Other: lymphedema risk Lt UE ?  ?WEIGHT BEARING RESTRICTIONS No ? ?SUBJECTIVE: Surgery 08/22/21 bil mastectomy with drains off last Monday.  They are still draining - Dr. Brantley Stage has me put pads to get the fluid. I would prefer to do the legs therapy until the top part is healed.  I just finished antibiotics from the redness.   ? ?PAIN:  ?Are you having pain? Yes 6/10 bil anterior chest ? ?Observations: healing bil mastectomy incisions with scabbing and yellowing sloughy tissue  bil.  Padding in place for drainage and red skin bil incisions - pt reports ending antibiotics today due to possible infection.   ? ?  ?TREATMENT  ?09/12/21:In // bars:  ?Reviewed with pt that we can hold on treatment still but pt would like to try some balance work   ?Heel/toe walking x 4 with occasional light touch on bars  ?Heel raises x 10 with occasional 1 HHA - then toe raises x 10  ?Hip abd x 10 reps bilat  ?Standing on airex with EO x 1 min with head turns Rt/Lt    ?Seated knee extension with 3 sec holds x 10 reps bilat ?Seated upright posture education for post mastectomy ?Reviewed post mastectomy care and importance on healing right now - stopped due to pt just not looking like she was feeling well and having some anterior Rt chest wall pain at incision.   ? ?08/21/21:   ? In // bars:  Heel/toe walking x 4 with occasional 1 HHA on  bar ?   Heel raises x 20 with occasional 1 HHA - then     Seated recovery period ?   Hip flexion x 10 bilat with occasional CGA with     Increased difficulty on L  ?   Hip ext x 10 reps bilat with occasion

## 2021-09-14 ENCOUNTER — Encounter: Payer: Medicare HMO | Admitting: Rehabilitation

## 2021-09-19 ENCOUNTER — Other Ambulatory Visit: Payer: Self-pay

## 2021-09-19 ENCOUNTER — Encounter: Payer: Self-pay | Admitting: Rehabilitation

## 2021-09-19 ENCOUNTER — Ambulatory Visit: Payer: Medicare HMO | Admitting: Rehabilitation

## 2021-09-19 DIAGNOSIS — M6281 Muscle weakness (generalized): Secondary | ICD-10-CM | POA: Diagnosis not present

## 2021-09-19 DIAGNOSIS — Z483 Aftercare following surgery for neoplasm: Secondary | ICD-10-CM

## 2021-09-19 NOTE — Therapy (Signed)
?OUTPATIENT PHYSICAL THERAPY TREATMENT NOTE ? ? ?Patient Name: Cynthia [name] ?MRN: [id number] ?DOB:Nov 05, 1970, 51 y.o., female ?Today's Date: 09/19/2021 ? ?PCP: Arthur Holms, NP ?REFERRING PROVIDER: Truitt Merle, MD ? ? PT End of Session - 09/19/21 1347   ? ? Visit Number 6   ? Number of Visits 19   ? Date for PT Re-Evaluation 10/09/21   ? Authorization - Visit Number 6   ? Authorization - Number of Visits 12   ? PT Start Time 1300   ? PT Stop Time 2482   ? PT Time Calculation (min) 47 min   ? Activity Tolerance Patient tolerated treatment well   ? Behavior During Therapy [facility] for tasks assessed/performed   ? ?  ?  ? ?  ? ? ? ?Past Medical History:  ?Diagnosis Date  ? Allergy   ? pollen  ? Anemia   ? Anxiety   ? BRCA2 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 03/01/2021  ? Breast cancer ([place])   ? Cataract   ? surgical repair bilateral  ? Coronary artery disease   ? Depression   ? ESRD on hemodialysis ([place])   ? Home HD 5x per week- not on dialysis now had tramsplant 4/17  ? Family history of ovarian cancer 02/22/2021  ? GERD (gastroesophageal reflux disease)   ? Hearing loss 2017  ? right ear  ? History of blood transfusion   ? transfusion reaction  ? Hyperlipidemia   ? Hypertension   ? Insulin-dependent diabetes mellitus with renal complications   ? Type I beginning now type II per pt-dr levy also II; Per patient 08/18/21 she is Type 2  ? Kidney transplant recipient   ? Neuromuscular disorder ([place])   ? NEUROPATHY  ? Sleep apnea   ? ?Past Surgical History:  ?Procedure Laterality Date  ? ABDOMINAL HYSTERECTOMY    ? BREAST EXCISIONAL BIOPSY Right 08/2015  ? BREAST LUMPECTOMY WITH RADIOACTIVE SEED LOCALIZATION Right 09/14/2015  ? Procedure: RIGHT BREAST LUMPECTOMY WITH RADIOACTIVE SEED LOCALIZATION;  Surgeon: Donnie Mesa, MD;  Location: [place];  Service: General;  Laterality: Right;  ? BREAST LUMPECTOMY WITH RADIOACTIVE SEED LOCALIZATION Left 03/02/2021  ? Procedure: LEFT BREAST SEED LUMPECTOMY LEFT SENTINEL LYMPH  NODE MAPPING;  Surgeon: Erroll Luna, MD;  Location: [place];  Service: General;  Laterality: Left;  GEN AND PEC BLOCK  ? BREAST SURGERY Bilateral   ? biopsy bilateral  ? CARDIAC CATHETERIZATION    ? CATARACT EXTRACTION Bilateral   ? bilateral  ? CHOLECYSTECTOMY    ? CYST REMOVAL NECK    ? DIALYSIS FISTULA CREATION Left   ? EYE SURGERY Bilateral   ? lazer  ? kidney transplant    ? LEFT HEART CATH AND CORONARY ANGIOGRAPHY N/A 03/04/2019  ? Procedure: LEFT HEART CATH AND CORONARY ANGIOGRAPHY;  Surgeon: Martinique, Peter M, MD;  Location: [place] [facility];  Service: Cardiovascular;  Laterality: N/A;  ? LEFT HEART CATHETERIZATION WITH CORONARY ANGIOGRAM N/A 03/30/2014  ? Procedure: LEFT HEART CATHETERIZATION WITH CORONARY ANGIOGRAM;  Surgeon: Sinclair Grooms, MD;  Location: [facility] C[facility] LAB;  Service: Cardiovascular;  Laterality: N/A;  ? LIPOMA EXCISION N/A 02/06/2017  ? Procedure: EXCISION POSTERIOR NECK SEBACEOUS CYST;  Surgeon: Coralie Keens, MD;  Location: [place];  Service: General;  Laterality: N/A;  ? 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  ? Procedure: REPAIR OF ARTERIOVENOUS FISTULA ANEURYSM;  Surgeon: Serafina Mitchell, MD;  Location: [place];  Service: Vascular;  Laterality: Left;  ?  REVISON OF ARTERIOVENOUS FISTULA Left 09/28/2013  ? Procedure: EXCISE ESCHAR LEFT ARM  ARTERIOVENOUS FISTULA WITH PLICATION OF LEFT ARM ARTERIOVENOUS FISTULA;  Surgeon: Elam Dutch, MD;  Location: [place];  Service: Vascular;  Laterality: Left;  ? REVISON OF ARTERIOVENOUS FISTULA Left 08/26/2015  ? Procedure: RESECTION ANEURYSM OF LEFT ARM ARTERIOVENOUS FISTULA  ;  Surgeon: Serafina Mitchell, MD;  Location: [place];  Service: Vascular;  Laterality: Left;  ? SIMPLE MASTECTOMY WITH AXILLARY SENTINEL NODE BIOPSY Bilateral 08/22/2021  ? Procedure: BILATERAL SIMPLE MASTECTOMY;  Surgeon: Erroll Luna, MD;  Location: [place];  Service: General;  Laterality: Bilateral;  ? TUBAL LIGATION    ? ?Patient  Active Problem List  ? Diagnosis Date Noted  ? Fever 08/24/2021  ? AKI (acute kidney injury/renal transplant on immunosuppresants  08/24/2021  ? BRCA2 gene mutation positive s/p bilateral masectomy  08/22/2021  ? Port-A-Cath in place 07/12/2021  ? Hyponatremia 07/03/2021  ? UTI (urinary tract infection) 07/02/2021  ? Leukocytosis 06/13/2021  ? Complicated UTI (urinary tract infection) 06/13/2021  ? PICC (peripherally inserted central catheter) in place 06/13/2021  ? Stage 3a chronic kidney disease (CKD) ([place]) 05/28/2021  ? Thrombocytopenia ([place]) 05/28/2021  ? Aortic atherosclerosis ([place]) 03/07/2021  ? Hypercholesterolemia 03/07/2021  ? BRCA2 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 03/01/2021  ? Genetic testing 03/01/2021  ? Family history of ovarian cancer 02/22/2021  ? Malignant neoplasm of lower-inner quadrant of left breast in female, estrogen receptor positive ([place]) 02/15/2021  ? Foot pain, bilateral 07/28/2020  ? COVID-19 virus infection 06/16/2019  ? Insulin dependent type 2 diabetes mellitus ([place]) 06/06/2019  ? Hyperglycemia 06/02/2019  ? DKA (diabetic ketoacidoses) 06/02/2019  ? HTN (hypertension) 03/04/2019  ? Abnormal nuclear stress test 03/04/2019  ? Diabetic nephropathy ([place]) 03/03/2019  ? History of renal transplant 11/18/2015  ? Diabetic retinopathy associated with type 2 diabetes mellitus ([place]) 04/13/2015  ? Postprandial vomiting   ? 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  ? Anemia 01/04/2015  ? Pseudoaneurysm of arteriovenous dialysis fistula ([place]) 03/16/2014  ? Hyperkalemia 11/16/2013  ? Chronic constipation 07/28/2013  ? Paresthesias 07/28/2013  ? Diabetes mellitus type I ([place])   ? Kidney transplant status 05/28/2013  ? 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 Precordial chest pain 02/10/2013  ? Awaiting organ transplant 09/19/2012  ? ? ?REFERRING DIAG: neuropathy and breast cancer ? ?THERAPY DIAG:  ?Muscle weakness (generalized) ? ?Aftercare following surgery for neoplasm ? ?PERTINENT HISTORY: PERTINENT HISTORY:  ?Left  breast cancer s/p left lumpectomy 03/02/21 due to grade 2 IDC with negative LN.  Due to high risk oncotype pt started adjuvant TC after delayed wound healing. bil mastectomy with Dr. Brantley Stage 08/22/21. On gabapentin for diabetic neuropathy and long term steroids for kidney transplant. Other history includes: CAD, type 1 DM, HTN, s/p kidney transplant 2017.  ? ?PRECAUTIONS: PRECAUTIONS: Other: lymphedema risk Lt UE ?  ?WEIGHT BEARING RESTRICTIONS No ? ?SUBJECTIVE: I am feeling much better.  I feel up for some exercise.   ? ?PAIN:  ?No ? ? ?TREATMENT  ?09/12/21: ?Nustep x 6 min on Level 1 with seat at 6 and no Ues ?Seated LAQ 1# x 10 bil ?Seated march 1# x 10 bil ? ?In // bars:  Standing on airex bil hip abduction 1# x 10 bil ?Toe/heel walking x 4 with occasional 1 HHA on bar ?  Heel raises x 20 with occasional 1 HHA   ?  Hip flexion x 10 bilat standing on airex 1# x 10bil    ?  Hip ext  x 10 reps bilat with occasional SBA on airex ?  Standing on rebounder with EC x 30 sec    ?  Marching on rebounder with no HHA x 15 bilat SBA ?     ? ? ?09/12/21:In // bars:  ?Reviewed with pt that we can hold on treatment still but pt would like to try some balance work   ?Heel/toe walking x 4 with occasional light touch on bars  ?Heel raises x 10 with occasional 1 HHA - then toe raises x 10  ?Hip abd x 10 reps bilat  ?Standing on airex with EO x 1 min with head turns Rt/Lt    ?Seated knee extension with 3 sec holds x 10 reps bilat ?Seated upright posture education for post mastectomy ?Reviewed post mastectomy care and importance on healing right now - stopped due to pt just not looking like she was feeling well and having some anterior Rt chest wall pain at incision.   ? ?08/21/21:   ? In // bars:  Heel/toe walking x 4 with occasional 1 HHA on bar ?   Heel raises x 20 with occasional 1 HHA - then     Seated recovery period ?   Hip flexion x 10 bilat with occasional CGA with     Increased difficulty on L  ?   Hip ext x 10 reps bilat with  occasional CGA ?   Hip abd x 10 reps bilat with occasional CGA ?   Standing on airex with EO x 1 min with increased     Sway ?   Standing on airex with EC x 30 sec with increased    Difficulty ?   Marching

## 2021-09-21 ENCOUNTER — Encounter: Payer: Self-pay | Admitting: Rehabilitation

## 2021-09-21 ENCOUNTER — Ambulatory Visit: Payer: Medicare HMO | Admitting: Rehabilitation

## 2021-09-21 DIAGNOSIS — M6281 Muscle weakness (generalized): Secondary | ICD-10-CM

## 2021-09-21 DIAGNOSIS — R293 Abnormal posture: Secondary | ICD-10-CM

## 2021-09-21 DIAGNOSIS — Z483 Aftercare following surgery for neoplasm: Secondary | ICD-10-CM

## 2021-09-21 DIAGNOSIS — C50312 Malignant neoplasm of lower-inner quadrant of left female breast: Secondary | ICD-10-CM

## 2021-09-21 NOTE — Therapy (Signed)
?OUTPATIENT PHYSICAL THERAPY TREATMENT NOTE ? ? ?Patient Name: Cynthia [name] ?MRN: [id number] ?DOB:June 30, 1970, 51 y.o., female ?Today's Date: 09/21/2021 ? ?PCP: Arthur Holms, NP ?REFERRING PROVIDER: Arthur Holms, NP ? ? PT End of Session - 09/21/21 1322   ? ? Visit Number 7   ? Number of Visits 19   ? Date for PT Re-Evaluation 10/09/21   ? Authorization Type ok12 visits 08/14/21-10/09/21   ? Authorization - Visit Number 7   ? Authorization - Number of Visits 12   ? PT Start Time 1330   ? PT Stop Time 1420   ? PT Time Calculation (min) 50 min   ? Activity Tolerance Patient tolerated treatment well   ? Behavior During Therapy [facility] for tasks assessed/performed   ? ?  ?  ? ?  ? ? ? ?Past Medical History:  ?Diagnosis Date  ? Allergy   ? pollen  ? Anemia   ? Anxiety   ? BRCA2 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 03/01/2021  ? Breast cancer ([place])   ? Cataract   ? surgical repair bilateral  ? Coronary artery disease   ? Depression   ? ESRD on hemodialysis ([place])   ? Home HD 5x per week- not on dialysis now had tramsplant 4/17  ? Family history of ovarian cancer 02/22/2021  ? GERD (gastroesophageal reflux disease)   ? Hearing loss 2017  ? right ear  ? History of blood transfusion   ? transfusion reaction  ? Hyperlipidemia   ? Hypertension   ? Insulin-dependent diabetes mellitus with renal complications   ? Type I beginning now type II per pt-dr levy also II; Per patient 08/18/21 she is Type 2  ? Kidney transplant recipient   ? Neuromuscular disorder ([place])   ? NEUROPATHY  ? Sleep apnea   ? ?Past Surgical History:  ?Procedure Laterality Date  ? ABDOMINAL HYSTERECTOMY    ? BREAST EXCISIONAL BIOPSY Right 08/2015  ? BREAST LUMPECTOMY WITH RADIOACTIVE SEED LOCALIZATION Right 09/14/2015  ? Procedure: RIGHT BREAST LUMPECTOMY WITH RADIOACTIVE SEED LOCALIZATION;  Surgeon: Donnie Mesa, MD;  Location: [place];  Service: General;  Laterality: Right;  ? BREAST LUMPECTOMY WITH RADIOACTIVE SEED LOCALIZATION Left 03/02/2021  ?  Procedure: LEFT BREAST SEED LUMPECTOMY LEFT SENTINEL LYMPH NODE MAPPING;  Surgeon: Erroll Luna, MD;  Location: [place];  Service: General;  Laterality: Left;  GEN AND PEC BLOCK  ? BREAST SURGERY Bilateral   ? biopsy bilateral  ? CARDIAC CATHETERIZATION    ? CATARACT EXTRACTION Bilateral   ? bilateral  ? CHOLECYSTECTOMY    ? CYST REMOVAL NECK    ? DIALYSIS FISTULA CREATION Left   ? EYE SURGERY Bilateral   ? lazer  ? kidney transplant    ? LEFT HEART CATH AND CORONARY ANGIOGRAPHY N/A 03/04/2019  ? Procedure: LEFT HEART CATH AND CORONARY ANGIOGRAPHY;  Surgeon: Martinique, Peter M, MD;  Location: [place] [facility];  Service: Cardiovascular;  Laterality: N/A;  ? LEFT HEART CATHETERIZATION WITH CORONARY ANGIOGRAM N/A 03/30/2014  ? Procedure: LEFT HEART CATHETERIZATION WITH CORONARY ANGIOGRAM;  Surgeon: Sinclair Grooms, MD;  Location: [facility] CATH LAB;  Service: Cardiovascular;  Laterality: N/A;  ? LIPOMA EXCISION N/A 02/06/2017  ? Procedure: EXCISION POSTERIOR NECK SEBACEOUS CYST;  Surgeon: Coralie Keens, MD;  Location: [place];  Service: General;  Laterality: N/A;  ? 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  ? Procedure: REPAIR OF ARTERIOVENOUS FISTULA ANEURYSM;  Surgeon: Serafina Mitchell, MD;  Location:  [facility] OR;  Service: Vascular;  Laterality: Left;  ? REVISON OF ARTERIOVENOUS FISTULA Left 09/28/2013  ? Procedure: EXCISE ESCHAR LEFT ARM  ARTERIOVENOUS FISTULA WITH PLICATION OF LEFT ARM ARTERIOVENOUS FISTULA;  Surgeon: Elam Dutch, MD;  Location: [place];  Service: Vascular;  Laterality: Left;  ? REVISON OF ARTERIOVENOUS FISTULA Left 08/26/2015  ? Procedure: RESECTION ANEURYSM OF LEFT ARM ARTERIOVENOUS FISTULA  ;  Surgeon: Serafina Mitchell, MD;  Location: [place];  Service: Vascular;  Laterality: Left;  ? SIMPLE MASTECTOMY WITH AXILLARY SENTINEL NODE BIOPSY Bilateral 08/22/2021  ? Procedure: BILATERAL SIMPLE MASTECTOMY;  Surgeon: Erroll Luna, MD;  Location: [place];  Service: General;   Laterality: Bilateral;  ? TUBAL LIGATION    ? ?Patient Active Problem List  ? Diagnosis Date Noted  ? Fever 08/24/2021  ? AKI (acute kidney injury/renal transplant on immunosuppresants  08/24/2021  ? BRCA2 gene mutation positive s/p bilateral masectomy  08/22/2021  ? Port-A-Cath in place 07/12/2021  ? Hyponatremia 07/03/2021  ? UTI (urinary tract infection) 07/02/2021  ? Leukocytosis 06/13/2021  ? Complicated UTI (urinary tract infection) 06/13/2021  ? PICC (peripherally inserted central catheter) in place 06/13/2021  ? Stage 3a chronic kidney disease (CKD) ([place]) 05/28/2021  ? Thrombocytopenia ([place]) 05/28/2021  ? Aortic atherosclerosis ([place]) 03/07/2021  ? Hypercholesterolemia 03/07/2021  ? BRCA2 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 03/01/2021  ? Genetic testing 03/01/2021  ? Family history of ovarian cancer 02/22/2021  ? Malignant neoplasm of lower-inner quadrant of left breast in female, estrogen receptor positive ([place]) 02/15/2021  ? Foot pain, bilateral 07/28/2020  ? COVID-19 virus infection 06/16/2019  ? Insulin dependent type 2 diabetes mellitus ([place]) 06/06/2019  ? Hyperglycemia 06/02/2019  ? DKA (diabetic ketoacidoses) 06/02/2019  ? HTN (hypertension) 03/04/2019  ? Abnormal nuclear stress test 03/04/2019  ? Diabetic nephropathy ([place]) 03/03/2019  ? History of renal transplant 11/18/2015  ? Diabetic retinopathy associated with type 2 diabetes mellitus ([place]) 04/13/2015  ? Postprandial vomiting   ? 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  ? Anemia 01/04/2015  ? Pseudoaneurysm of arteriovenous dialysis fistula ([place]) 03/16/2014  ? Hyperkalemia 11/16/2013  ? Chronic constipation 07/28/2013  ? Paresthesias 07/28/2013  ? Diabetes mellitus type I ([place])   ? Kidney transplant status 05/28/2013  ? 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 Precordial chest pain 02/10/2013  ? Awaiting organ transplant 09/19/2012  ? ? ?REFERRING DIAG: neuropathy and breast cancer ? ?THERAPY DIAG:  ?Muscle weakness (generalized) ? ?Aftercare following surgery for  neoplasm ? ?Abnormal posture ? ?Malignant neoplasm of lower-inner quadrant of left breast in female, estrogen receptor positive ([place]) ? ?PERTINENT HISTORY: PERTINENT HISTORY:  ?Left breast cancer s/p left lumpectomy 03/02/21 due to grade 2 IDC with negative LN.  Due to high risk oncotype pt started adjuvant TC after delayed wound healing. bil mastectomy with Dr. Brantley Stage 08/22/21. On gabapentin for diabetic neuropathy and long term steroids for kidney transplant. Other history includes: CAD, type 1 DM, HTN, s/p kidney transplant 2017.  ? ?PRECAUTIONS: PRECAUTIONS: Other: lymphedema risk Lt UE ?  ?WEIGHT BEARING RESTRICTIONS No ? ?SUBJECTIVE:   I felt fine after last time. No pain today. The legs are feeling a bit stronger.  The only feel tired when I do a lot of things and not every day  ? ?PAIN:  ?No ? ? ?TREATMENT  ?09/21/21 ?Nustep x 8 min on Level 1 with seat at 6 and no Ues ?Seated LAQ 2# x 10 bil ?Seated march 2# x 10 bil seated on wobble disc ?Sit to stand x  10 ?Step up 6" x 10 bil ? ?In // bars:  Standing on foam oval  hip abduction 2# x 10 bil ?      Hip flexion 2# x 10 bil ?      Hip extension 2# x 10 bil ? ?  Heel raises x 20 with occasional 1 HHA   ?  Standing on black foam  EC 3x15" ?      Tandem stance x 20" bil x 2 ?      SLS max x 3 bil  ?   ?Pt continues with poor wound healing with the left side starting to drain through 2-3 small maxipads per day taped to the chest.  Pt reports new as of last night.  Surgeon has given her neosporin and gauze with paper tape to apply.  Changed Lt dressing as it had soaked light pad with ABD pad folded over and applied with medipore tape which pt reports is fine for her skin.  Education on letting MD know about changes in drainage color or other signs of infection.  ? ?09/12/21: ?Nustep x 6 min on Level 1 with seat at 6 and no Ues ?Seated LAQ 1# x 10 bil ?Seated march 1# x 10 bil ? ?In // bars:  Standing on airex bil hip abduction 1# x 10 bil ?Toe/heel walking x 4 with  occasional 1 HHA on bar ?  Heel raises x 20 with occasional 1 HHA   ?  Hip flexion x 10 bilat standing on airex 1# x 10bil    ?  Hip ext x 10 reps bilat with occasional SBA on airex ?  Standing on rebounder w

## 2021-09-22 NOTE — Progress Notes (Signed)
?[facility] Note ? ?09/25/2021 ? ?  ? ?CHIEF COMPLAINT ?Patient presents for Retina Evaluation ? ? ?HISTORY OF PRESENT ILLNESS: ?Cynthia [name] is a 51 y.o. female who presents to the clinic today for:  ? ?HPI   ? ? Retina Evaluation   ?In both eyes.  This started 2 months ago.  Duration of 2 months.  Associated Symptoms Flashes.  I, the attending physician,  performed the HPI with the patient and updated documentation appropriately. ? ?  ?  ? ? Comments   ?Pt here for diabetic ret eval referred by Dr. Katy Fitch for PDR OU. Pt states after finishing chemotherapy sometime in Feb 2023 she started having cloudy vision. She is now finished with the chemo, breast cancer.  Sometimes she will see a flash of light on the left side. Also reports intermittent pains in OU accompanied by burning. Pt uses OTC gtts, Refresh. Pt reports diabetes since 1993, last A1C level in Feb was 7.3. Pt wears rx specs, has had the current pair 1 year.  ? ?  ?  ?Last edited by Bernarda Caffey, MD on 09/25/2021  2:19 PM.  ?  ?Pt is here on the referral of Dr. Katy Fitch for PDR, pt states she has previously seen a retina specialist in [place], [place] and had several laser procedures, she estimates she saw him between 2000-2001, she states she has also had cataract sx, but with a laser, she saw Dr. Katy Fitch last week for a routine exam, she states her PCP sent her there bc she is on chemo for breast cancer and it was effecting her eyes, she states she is seeing fol as well as having burning and occasional pain, pts A1c was 7.3 on 02.23.23 ? ?Referring physician: ?Debbra Riding, Md ?[address] ?[address] ?[place],  [place] [address] ? ?HISTORICAL INFORMATION:  ? ?Selected notes from the [place] ?Referred by Dr. Wyatt Portela for diabetic retinal eval ?LEE: 09/07/2021 BCVA OD: 20/30 OS: 20/25 ?Ocular Hx- PDR, pseudophakia, dry eye ?PMH- DM, HTN, depression, kidney transplant; last a1c was 7.3 on 08/18/21 ?  ? ?CURRENT  MEDICATIONS: ?No current outpatient medications on file. (Ophthalmic Drugs)  ? ?No current facility-administered medications for this visit. (Ophthalmic Drugs)  ? ?Current Outpatient Medications (Other)  ?Medication Sig  ? Accu-Chek Softclix Lancets lancets Use as instructed (Patient taking differently: 1 each by Other route See admin instructions. Use as instructed)  ? acetaminophen (TYLENOL) 500 MG tablet Take 500 mg by mouth every 6 (six) hours as needed for mild pain, fever or headache.  ? aspirin EC 81 MG EC tablet Take 1 tablet (81 mg total) by mouth daily.  ? Blood Glucose Monitoring Suppl (ACCU-CHEK AVIVA CONNECT) w/Device KIT Check sugar 1x daily (Patient taking differently: 1 each by Other route See admin instructions. Check sugar 1x daily)  ? carvedilol (COREG) 25 MG tablet Take 25 mg by mouth 2 (two) times daily with a meal.  ? cetirizine (ZYRTEC) 10 MG tablet Take 10 mg by mouth daily.  ? cholecalciferol (VITAMIN D3) 25 MCG (1000 UNIT) tablet Take 1,000 Units by mouth daily.  ? cyanocobalamin 1000 MCG tablet Take 1,000 mcg by mouth daily.  ? escitalopram (LEXAPRO) 10 MG tablet Take 10 mg by mouth daily.  ? exemestane (AROMASIN) 25 MG tablet Take 1 tablet (25 mg total) by mouth daily after breakfast.  ? furosemide (LASIX) 40 MG tablet Take 40 mg by mouth 2 (two) times daily  as needed for fluid.  ? gabapentin (NEURONTIN) 300 MG capsule Take 1 capsule (300 mg total) by mouth 2 (two) times daily. Start at 1 cap once daily, and increase to twice daily if she tolerates. Continue 669m at night  ? glucose blood (ACCU-CHEK AVIVA PLUS) test strip Check sugar 1x daily (Patient taking differently: 1 each by Other route See admin instructions. Check sugar 1x daily)  ? insulin glargine (LANTUS SOLOSTAR) 100 UNIT/ML Solostar Pen Inject 25 Units into the skin 2 (two) times daily. (Patient taking differently: Inject 20 Units into the skin 2 (two) times daily.)  ? Insulin Pen Needle (PEN NEEDLES) 30G X 8 MM MISC 1 each  by Does not apply route daily. E11.9  ? magnesium oxide (MAG-OX) 400 MG tablet Take 400 mg by mouth 2 (two) times daily.  ? MOUNJARO 2.5 MG/0.5ML Pen Inject 2.5 mg into the skin every Thursday.  ? mycophenolate (MYFORTIC) 180 MG EC tablet Take 360 mg by mouth 2 (two) times daily.  ? ondansetron (ZOFRAN) 8 MG tablet Take 1 tablet (8 mg total) by mouth 2 (two) times daily as needed for refractory nausea / vomiting. Start on day 3 after chemo.  ? oxyCODONE (OXY IR/ROXICODONE) 5 MG immediate release tablet Take 1 tablet (5 mg total) by mouth every 6 (six) hours as needed for severe pain.  ? pantoprazole (PROTONIX) 40 MG tablet Take 40 mg by mouth 2 (two) times daily.  ? predniSONE (DELTASONE) 5 MG tablet Take 5 mg by mouth daily.   ? rosuvastatin (CRESTOR) 5 MG tablet Take 5 mg by mouth at bedtime.  ? tacrolimus (PROGRAF) 1 MG capsule Take 3 mg by mouth daily.  ? NOVOLOG FLEXPEN 100 UNIT/ML FlexPen Inject 5 Units into the skin 3 (three) times daily with meals. (Patient not taking: Reported on 09/25/2021)  ? ?No current facility-administered medications for this visit. (Other)  ? ?REVIEW OF SYSTEMS: ?ROS   ?Positive for: Endocrine, Eyes, Allergic/Imm ?Negative for: Constitutional, Gastrointestinal, Neurological, Skin, Genitourinary, Musculoskeletal, HENT, Cardiovascular, Respiratory, Psychiatric, Heme/Lymph ?Last edited by SKingsley Spittle COT on 09/25/2021  1:27 PM.  ?  ? ?ALLERGIES ?Allergies  ?Allergen Reactions  ? Docetaxel Nausea Only  ?  Diaphoretic.  Patient had hypersensitivity reaction to docetaxel.  See progress note from 05/24/2021.  Patient able to complete infusion.  ? Norvasc [Amlodipine] Swelling  ? Tape Other (See Comments)  ?  Burns skin.  Please use paper tape only  ? ?PAST MEDICAL HISTORY ?Past Medical History:  ?Diagnosis Date  ? Allergy   ? pollen  ? Anemia   ? Anxiety   ? BRCA2 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 03/01/2021  ? [place]   ? Cataract   ? surgical repair bilateral  ? Coronary  artery disease   ? Depression   ? ESRD on hemodialysis ([place]   ? Home HD 5x per week- not on dialysis now had tramsplant 4/17  ? Family history of ovarian cancer 02/22/2021  ? GERD (gastroesophageal reflux disease)   ? Hearing loss 2017  ? right ear  ? History of blood transfusion   ? transfusion reaction  ? Hyperlipidemia   ? Hypertension   ? Insulin-dependent diabetes mellitus with renal complications   ? Type I beginning now type II per pt-dr levy also II; Per patient 08/18/21 she is Type 2  ? Kidney transplant recipient   ? Neuromuscular disorder ([place]   ? NEUROPATHY  ? Sleep apnea   ? ?Past Surgical History:  ?  Procedure Laterality Date  ? ABDOMINAL HYSTERECTOMY    ? BREAST EXCISIONAL BIOPSY Right 08/2015  ? BREAST LUMPECTOMY WITH RADIOACTIVE SEED LOCALIZATION Right 09/14/2015  ? Procedure: RIGHT BREAST LUMPECTOMY WITH RADIOACTIVE SEED LOCALIZATION;  Surgeon: Donnie Mesa, MD;  Location: [place];  Service: General;  Laterality: Right;  ? BREAST LUMPECTOMY WITH RADIOACTIVE SEED LOCALIZATION Left 03/02/2021  ? Procedure: LEFT BREAST SEED LUMPECTOMY LEFT SENTINEL LYMPH NODE MAPPING;  Surgeon: Erroll Luna, MD;  Location: [place];  Service: General;  Laterality: Left;  GEN AND PEC BLOCK  ? BREAST SURGERY Bilateral   ? biopsy bilateral  ? CARDIAC CATHETERIZATION    ? CATARACT EXTRACTION Bilateral   ? bilateral  ? CHOLECYSTECTOMY    ? CYST REMOVAL NECK    ? DIALYSIS FISTULA CREATION Left   ? EYE SURGERY Bilateral   ? lazer  ? kidney transplant    ? LEFT HEART CATH AND CORONARY ANGIOGRAPHY N/A 03/04/2019  ? Procedure: LEFT HEART CATH AND CORONARY ANGIOGRAPHY;  Surgeon: Martinique, Peter M, MD;  Location: [place] CV LAB;  Service: Cardiovascular;  Laterality: N/A;  ? LEFT HEART CATHETERIZATION WITH CORONARY ANGIOGRAM N/A 03/30/2014  ? Procedure: LEFT HEART CATHETERIZATION WITH CORONARY ANGIOGRAM;  Surgeon: Sinclair Grooms, MD;  Location: [facility] CATH LAB;  Service: Cardiovascular;   Laterality: N/A;  ? LIPOMA EXCISION N/A 02/06/2017  ? Procedure: EXCISION POSTERIOR NECK SEBACEOUS CYST;  Surgeon: Coralie Keens, MD;  Location: [place];  Service: General;  Laterality: N/A;  ? RESECTION O

## 2021-09-23 ENCOUNTER — Encounter: Payer: Self-pay | Admitting: Hematology

## 2021-09-25 ENCOUNTER — Encounter (INDEPENDENT_AMBULATORY_CARE_PROVIDER_SITE_OTHER): Payer: Self-pay | Admitting: Ophthalmology

## 2021-09-25 ENCOUNTER — Ambulatory Visit (INDEPENDENT_AMBULATORY_CARE_PROVIDER_SITE_OTHER): Payer: Medicare HMO | Admitting: Ophthalmology

## 2021-09-25 ENCOUNTER — Encounter: Payer: Self-pay | Admitting: Hematology

## 2021-09-25 DIAGNOSIS — E113553 Type 2 diabetes mellitus with stable proliferative diabetic retinopathy, bilateral: Secondary | ICD-10-CM

## 2021-09-25 DIAGNOSIS — Z961 Presence of intraocular lens: Secondary | ICD-10-CM

## 2021-09-25 DIAGNOSIS — H35033 Hypertensive retinopathy, bilateral: Secondary | ICD-10-CM | POA: Diagnosis not present

## 2021-09-25 DIAGNOSIS — I10 Essential (primary) hypertension: Secondary | ICD-10-CM

## 2021-09-26 ENCOUNTER — Ambulatory Visit: Payer: Medicare HMO | Attending: Hematology | Admitting: Rehabilitation

## 2021-09-26 DIAGNOSIS — C50312 Malignant neoplasm of lower-inner quadrant of left female breast: Secondary | ICD-10-CM | POA: Insufficient documentation

## 2021-09-26 DIAGNOSIS — M6281 Muscle weakness (generalized): Secondary | ICD-10-CM | POA: Insufficient documentation

## 2021-09-26 DIAGNOSIS — R293 Abnormal posture: Secondary | ICD-10-CM | POA: Diagnosis present

## 2021-09-26 DIAGNOSIS — Z483 Aftercare following surgery for neoplasm: Secondary | ICD-10-CM | POA: Diagnosis present

## 2021-09-26 DIAGNOSIS — Z170 Estrogen receptor positive status [ER+]: Secondary | ICD-10-CM | POA: Insufficient documentation
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
## 2021-09-28 ENCOUNTER — Encounter: Payer: Self-pay | Admitting: Rehabilitation

## 2021-09-28 ENCOUNTER — Ambulatory Visit: Payer: Medicare HMO | Admitting: Rehabilitation

## 2021-09-28 DIAGNOSIS — Z483 Aftercare following surgery for neoplasm: Secondary | ICD-10-CM

## 2021-09-28 DIAGNOSIS — M6281 Muscle weakness (generalized): Secondary | ICD-10-CM

## 2021-09-28 NOTE — Therapy (Signed)
?OUTPATIENT PHYSICAL THERAPY TREATMENT NOTE ? ? ?Patient Name: Cynthia [name] ?MRN: [id number] ?DOB:14-Mar-1971, 51 y.o., female ?Today's Date: 09/28/2021 ? ?PCP: Arthur Holms, NP ?REFERRING PROVIDER: Arthur Holms, NP ? ? PT End of Session - 09/28/21 1323   ? ? Visit Number 9   ? Number of Visits 19   ? Date for PT Re-Evaluation 10/09/21   ? Authorization Type ok12 visits 08/14/21-10/09/21   ? Authorization - Visit Number 9   ? Authorization - Number of Visits 12   ? PT Start Time 1325   ? PT Stop Time 8768   ? PT Time Calculation (min) 50 min   ? Activity Tolerance Patient tolerated treatment well   ? Behavior During Therapy [facility] for tasks assessed/performed   ? ?  ?  ? ?  ? ? ? ? ?Past Medical History:  ?Diagnosis Date  ? Allergy   ? pollen  ? Anemia   ? Anxiety   ? BRCA2 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 03/01/2021  ? Breast cancer ([place])   ? Cataract   ? surgical repair bilateral  ? Coronary artery disease   ? Depression   ? ESRD on hemodialysis ([place])   ? Home HD 5x per week- not on dialysis now had tramsplant 4/17  ? Family history of ovarian cancer 02/22/2021  ? GERD (gastroesophageal reflux disease)   ? Hearing loss 2017  ? right ear  ? History of blood transfusion   ? transfusion reaction  ? Hyperlipidemia   ? Hypertension   ? Insulin-dependent diabetes mellitus with renal complications   ? Type I beginning now type II per pt-dr levy also II; Per patient 08/18/21 she is Type 2  ? Kidney transplant recipient   ? Neuromuscular disorder ([place])   ? NEUROPATHY  ? Sleep apnea   ? ?Past Surgical History:  ?Procedure Laterality Date  ? ABDOMINAL HYSTERECTOMY    ? BREAST EXCISIONAL BIOPSY Right 08/2015  ? BREAST LUMPECTOMY WITH RADIOACTIVE SEED LOCALIZATION Right 09/14/2015  ? Procedure: RIGHT BREAST LUMPECTOMY WITH RADIOACTIVE SEED LOCALIZATION;  Surgeon: Donnie Mesa, MD;  Location: [place];  Service: General;  Laterality: Right;  ? BREAST LUMPECTOMY WITH RADIOACTIVE SEED LOCALIZATION Left 03/02/2021  ?  Procedure: LEFT BREAST SEED LUMPECTOMY LEFT SENTINEL LYMPH NODE MAPPING;  Surgeon: Erroll Luna, MD;  Location: [place];  Service: General;  Laterality: Left;  GEN AND PEC BLOCK  ? BREAST SURGERY Bilateral   ? biopsy bilateral  ? CARDIAC CATHETERIZATION    ? CATARACT EXTRACTION Bilateral   ? bilateral  ? CHOLECYSTECTOMY    ? CYST REMOVAL NECK    ? DIALYSIS FISTULA CREATION Left   ? EYE SURGERY Bilateral   ? lazer  ? kidney transplant    ? LEFT HEART CATH AND CORONARY ANGIOGRAPHY N/A 03/04/2019  ? Procedure: LEFT HEART CATH AND CORONARY ANGIOGRAPHY;  Surgeon: Martinique, Peter M, MD;  Location: [place] CV LAB;  Service: Cardiovascular;  Laterality: N/A;  ? LEFT HEART CATHETERIZATION WITH CORONARY ANGIOGRAM N/A 03/30/2014  ? Procedure: LEFT HEART CATHETERIZATION WITH CORONARY ANGIOGRAM;  Surgeon: Sinclair Grooms, MD;  Location: [facility] CATH LAB;  Service: Cardiovascular;  Laterality: N/A;  ? LIPOMA EXCISION N/A 02/06/2017  ? Procedure: EXCISION POSTERIOR NECK SEBACEOUS CYST;  Surgeon: Coralie Keens, MD;  Location: [place];  Service: General;  Laterality: N/A;  ? 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  ? Procedure: REPAIR OF ARTERIOVENOUS FISTULA ANEURYSM;  Surgeon: Serafina Mitchell, MD;  Location: [facility];  Service: Vascular;  Laterality: Left;  ? REVISON OF ARTERIOVENOUS FISTULA Left 09/28/2013  ? Procedure: EXCISE ESCHAR LEFT ARM  ARTERIOVENOUS FISTULA WITH PLICATION OF LEFT ARM ARTERIOVENOUS FISTULA;  Surgeon: Elam Dutch, MD;  Location: [place];  Service: Vascular;  Laterality: Left;  ? REVISON OF ARTERIOVENOUS FISTULA Left 08/26/2015  ? Procedure: RESECTION ANEURYSM OF LEFT ARM ARTERIOVENOUS FISTULA  ;  Surgeon: Serafina Mitchell, MD;  Location: [place];  Service: Vascular;  Laterality: Left;  ? SIMPLE MASTECTOMY WITH AXILLARY SENTINEL NODE BIOPSY Bilateral 08/22/2021  ? Procedure: BILATERAL SIMPLE MASTECTOMY;  Surgeon: Erroll Luna, MD;  Location: [place];  Service: General;   Laterality: Bilateral;  ? TUBAL LIGATION    ? ?Patient Active Problem List  ? Diagnosis Date Noted  ? Fever 08/24/2021  ? AKI (acute kidney injury/renal transplant on immunosuppresants  08/24/2021  ? BRCA2 gene mutation positive s/p bilateral masectomy  08/22/2021  ? Port-A-Cath in place 07/12/2021  ? Hyponatremia 07/03/2021  ? UTI (urinary tract infection) 07/02/2021  ? Leukocytosis 06/13/2021  ? Complicated UTI (urinary tract infection) 06/13/2021  ? PICC (peripherally inserted central catheter) in place 06/13/2021  ? Stage 3a chronic kidney disease (CKD) ([place]) 05/28/2021  ? Thrombocytopenia ([place]) 05/28/2021  ? Aortic atherosclerosis ([place]) 03/07/2021  ? Hypercholesterolemia 03/07/2021  ? BRCA2 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 03/01/2021  ? Genetic testing 03/01/2021  ? Family history of ovarian cancer 02/22/2021  ? Malignant neoplasm of lower-inner quadrant of left breast in female, estrogen receptor positive ([place]) 02/15/2021  ? Foot pain, bilateral 07/28/2020  ? COVID-19 virus infection 06/16/2019  ? Insulin dependent type 2 diabetes mellitus ([place]) 06/06/2019  ? Hyperglycemia 06/02/2019  ? DKA (diabetic ketoacidoses) 06/02/2019  ? HTN (hypertension) 03/04/2019  ? Abnormal nuclear stress test 03/04/2019  ? Diabetic nephropathy ([place]) 03/03/2019  ? History of renal transplant 11/18/2015  ? Diabetic retinopathy associated with type 2 diabetes mellitus ([place]) 04/13/2015  ? Postprandial vomiting   ? 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  ? Anemia 01/04/2015  ? Pseudoaneurysm of arteriovenous dialysis fistula ([place]) 03/16/2014  ? Hyperkalemia 11/16/2013  ? Chronic constipation 07/28/2013  ? Paresthesias 07/28/2013  ? Diabetes mellitus type I ([place])   ? Kidney transplant status 05/28/2013  ? 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 Precordial chest pain 02/10/2013  ? Awaiting organ transplant 09/19/2012  ? ? ?REFERRING DIAG: neuropathy and breast cancer ? ?THERAPY DIAG:  ?Aftercare following surgery for neoplasm ? ?Muscle weakness  (generalized) ? ?PERTINENT HISTORY: PERTINENT HISTORY:  ?Left breast cancer s/p left lumpectomy 03/02/21 due to grade 2 IDC with negative LN.  Due to high risk oncotype pt started adjuvant TC after delayed wound healing. bil mastectomy with Dr. Brantley Stage 08/22/21. On gabapentin for diabetic neuropathy and long term steroids for kidney transplant. Other history includes: CAD, type 1 DM, HTN, s/p kidney transplant 2017.  ? ?PRECAUTIONS: PRECAUTIONS: Other: lymphedema risk Lt UE ?  ?WEIGHT BEARING RESTRICTIONS No ? ?SUBJECTIVE:   Can you look at the healing? ? ?PAIN:  ?No ? ? ?TREATMENT  ?09/28/21 ?Seated LAQ 2#  2x 10 bil ?Seated march 2# 2x 10 bil seated on wobble disc with no hands ?Sit to stand  2x 10 ?Step up 6" x 10 bil ?Supine: bridge x 10 ? Hip abduction red band x 10 ? Ball squeeze 3" x 10 ? PPT with TrA bracing 6' x 10 ? TrA with march x 10 bil ? LTR x 5 each ?  ? ?In // bars:  hip abduction  2# x 10 bil ?  Hip flexion 2# x 10 bil ?  Hip extension 2# x 10 bil ? ?  Standing on black foam    ?Tandem stance x 20" bil x 2 ?   SLS max x 3 bil ? ?  On black rocker board : stance and forward backward rock  - easy ? ?Sent note to Dr. Brantley Stage regarding poor wound healing status of bil mastectomy incisions as pt has had wound complications in the past - see photos for status ? ?09/26/21 ?Held on nustep due to someincreased back pain last time ?Seated LAQ 2#  2x 10 bil ?Seated march 2# 2x 10 bil seated on wobble disc with no hands ?Sit to stand  2x 10 ?Step up 6" x 10 bil ? ?In // bars:  Standing on foam oval  hip abduction 2# x 10 bil ?      Hip flexion 2# x 10 bil ?      Hip extension 2# x 10 bil ? ?  Standing on black foam  EC 3x15" ?      Tandem stance x 20" bil x 2 ?      SLS max x 3 bil ?  On black rocker board : stance and forward backward rock   ?Seated PPT/APT with education on pelvic positioning - no pain noted ?No pain with lumbar flexion, sidebending, lateral flexion, but some slight pain with extension ?Obstacles in  hallway: side steps over cones 3 cones x 3 Rt/Lt and standing in circle of colored discs calling out color and having pt tap with each foot (easy) ?Supine LTR x 5 bil, SKTC x 15" bil, tested hamstrings which we

## 2021-10-02 ENCOUNTER — Telehealth: Payer: Self-pay | Admitting: *Deleted

## 2021-10-02 NOTE — Telephone Encounter (Signed)
Spoke with pt to inform her that her surgery date has been rescheduled to 10-16-21 @ 0830. Pt stated she does not have her calendar with her right now so she'll have to see if that new date will work for her. She stated she's going to bring her calendar with her to see Joylene John, NP tomorrow. I stressed the importance of keeping the surgery date because it's not easy to get it rescheduled. Pt verbalized understanding and stated she'll see [place] tomorrow and discuss it then.  ?

## 2021-10-03 ENCOUNTER — Telehealth: Payer: Self-pay | Admitting: *Deleted

## 2021-10-03 ENCOUNTER — Other Ambulatory Visit: Payer: Self-pay

## 2021-10-03 ENCOUNTER — Ambulatory Visit: Payer: Medicare HMO | Admitting: Physical Therapy

## 2021-10-03 ENCOUNTER — Encounter: Payer: Self-pay | Admitting: Hematology

## 2021-10-03 ENCOUNTER — Inpatient Hospital Stay: Payer: Medicare HMO | Attending: Hematology | Admitting: Gynecologic Oncology

## 2021-10-03 VITALS — BP 167/68 | HR 80 | Temp 98.2°F | Resp 16 | Ht 61.0 in | Wt 192.2 lb

## 2021-10-03 DIAGNOSIS — Z9013 Acquired absence of bilateral breasts and nipples: Secondary | ICD-10-CM | POA: Insufficient documentation

## 2021-10-03 DIAGNOSIS — Z170 Estrogen receptor positive status [ER+]: Secondary | ICD-10-CM | POA: Insufficient documentation

## 2021-10-03 DIAGNOSIS — Z1509 Genetic susceptibility to other malignant neoplasm: Secondary | ICD-10-CM

## 2021-10-03 DIAGNOSIS — Z1501 Genetic susceptibility to malignant neoplasm of breast: Secondary | ICD-10-CM

## 2021-10-03 DIAGNOSIS — I10 Essential (primary) hypertension: Secondary | ICD-10-CM | POA: Insufficient documentation

## 2021-10-03 DIAGNOSIS — Z1502 Genetic susceptibility to malignant neoplasm of ovary: Secondary | ICD-10-CM

## 2021-10-03 DIAGNOSIS — R110 Nausea: Secondary | ICD-10-CM | POA: Insufficient documentation

## 2021-10-03 DIAGNOSIS — Z940 Kidney transplant status: Secondary | ICD-10-CM | POA: Insufficient documentation

## 2021-10-03 DIAGNOSIS — E1140 Type 2 diabetes mellitus with diabetic neuropathy, unspecified: Secondary | ICD-10-CM | POA: Insufficient documentation

## 2021-10-03 DIAGNOSIS — R229 Localized swelling, mass and lump, unspecified: Secondary | ICD-10-CM | POA: Insufficient documentation

## 2021-10-03 DIAGNOSIS — C50312 Malignant neoplasm of lower-inner quadrant of left female breast: Secondary | ICD-10-CM | POA: Insufficient documentation

## 2021-10-03 MED ORDER — SENNOSIDES-DOCUSATE SODIUM 8.6-50 MG PO TABS: 2.0000 | ORAL_TABLET | Freq: Every day | ORAL | 0 refills | Status: DC

## 2021-10-03 MED ORDER — OXYCODONE HCL 5 MG PO TABS: 5.0000 mg | ORAL_TABLET | Freq: Four times a day (QID) | ORAL | 0 refills | Status: DC | PRN

## 2021-10-03 NOTE — Patient Instructions (Addendum)
Preparing for your Surgery ? ?Plan for surgery on October 16, 2021 with Dr. Jeral Pinch at [facility]. You will be scheduled for robotic assisted laparoscopic bilateral salpingo-oophorectomy (removal of both ovaries and fallopian tubes), possible staging (if a cancer is identified), possible laparotomy (larger incision on your abdomen).  ? ?We will find out from Dr. Burr Medico if and when you need to stop taking your Aromasin before surgery and when to resume after surgery. ? ?Congratulations on your weight loss and improving your hemoglobin A1C. Continue working on keeping your blood sugar under control. This can affect healing after surgery and can increase your chance of infection if blood sugar levels are higher.  ? ?Pre-operative Testing ?-You will receive a phone call from presurgical testing at [facility] to arrange for a pre-operative appointment and lab work. ? ?-Bring your insurance card, copy of an advanced directive if applicable, medication list ? ?-At that visit, you will be asked to sign a consent for a possible blood transfusion in case a transfusion becomes necessary during surgery.  The need for a blood transfusion is rare but having consent is a necessary part of your care.    ? ?-You should not be taking blood thinners or aspirin at least ten days prior to surgery unless instructed by your surgeon. ? ?-Do not take supplements such as fish oil (omega 3), red yeast rice, turmeric before your surgery. You want to avoid medications with aspirin in them including headache powders such as BC or Goody's), Excedrin migraine. ? ?Day Before Surgery at Home ?-You will be asked to take in a light diet the day before surgery. You will be advised you can have clear liquids up until 3 hours before your surgery.   ? ?Eat a light diet the day before surgery.  Examples including soups, broths, toast, yogurt, mashed potatoes.  AVOID GAS PRODUCING FOODS. Things to avoid  include carbonated beverages (fizzy beverages, sodas), raw fruits and raw vegetables (uncooked), or beans.  ? ?If your bowels are filled with gas, your surgeon will have difficulty visualizing your pelvic organs which increases your surgical risks. ? ?Your role in recovery ?Your role is to become active as soon as directed by your doctor, while still giving yourself time to heal.  Rest when you feel tired. You will be asked to do the following in order to speed your recovery: ? ?- Cough and breathe deeply. This helps to clear and expand your lungs and can prevent pneumonia after surgery.  ?- STAY ACTIVE WHEN YOU GET HOME. Do mild physical activity. Walking or moving your legs help your circulation and body functions return to normal. Do not try to get up or walk alone the first time after surgery.   ?-If you develop swelling on one leg or the other, pain in the back of your leg, redness/warmth in one of your legs, please call the office or go to the Emergency Room to have a doppler to rule out a blood clot. For shortness of breath, chest pain-seek care in the Emergency Room as soon as possible. ?- Actively manage your pain. Managing your pain lets you move in comfort. We will ask you to rate your pain on a scale of zero to 10. It is your responsibility to tell your doctor or nurse where and how much you hurt so your pain can be treated. ? ?Special Considerations ?-If you are diabetic, you may be placed on insulin after surgery to have  closer control over your blood sugars to promote healing and recovery.  This does not mean that you will be discharged on insulin.  If applicable, your oral antidiabetics will be resumed when you are tolerating a solid diet. ? ?-Your final pathology results from surgery should be available around one week after surgery and the results will be relayed to you when available. ? ?-FMLA forms can be faxed to [phone] and please allow 5-7 business days for completion. ? ?Pain Management  After Surgery ?-You have been prescribed your pain medication and bowel regimen medications before surgery so that you can have these available when you are discharged from the hospital. The pain medication is for use ONLY AFTER surgery and a new prescription will not be given.  ? ?-Make sure that you have Tylenol at home to use on a regular basis after surgery for pain control.  ? ?-Review the attached handout on narcotic use and their risks and side effects.  ? ?Bowel Regimen ?-You have been prescribed Sennakot-S to take nightly to prevent constipation especially if you are taking the narcotic pain medication intermittently.  It is important to prevent constipation and drink adequate amounts of liquids. You can stop taking this medication when you are not taking pain medication and you are back on your normal bowel routine. ? ?Risks of Surgery ?Risks of surgery are low but include bleeding, infection, damage to surrounding structures, re-operation, blood clots, and very rarely death. ? ? ?Blood Transfusion Information (For the consent to be signed before surgery) ? ?We will be checking your blood type before surgery so in case of emergencies, we will know what type of blood you would need. ? ?                                          WHAT IS A BLOOD TRANSFUSION? ? ?A transfusion is the replacement of blood or some of its parts. Blood is made up of multiple cells which provide different functions. ?Red blood cells carry oxygen and are used for blood loss replacement. ?White blood cells fight against infection. ?Platelets control bleeding. ?Plasma helps clot blood. ?Other blood products are available for specialized needs, such as hemophilia or other clotting disorders. ?BEFORE THE TRANSFUSION  ?Who gives blood for transfusions?  ?You may be able to donate blood to be used at a later date on yourself (autologous donation). ?Relatives can be asked to donate blood. This is generally not any safer than if you have  received blood from a stranger. The same precautions are taken to ensure safety when a relative's blood is donated. ?Healthy volunteers who are fully evaluated to make sure their blood is safe. This is blood bank blood. ?Transfusion therapy is the safest it has ever been in the practice of medicine. Before blood is taken from a donor, a complete history is taken to make sure that person has no history of diseases nor engages in risky social behavior (examples are intravenous drug use or sexual activity with multiple partners). The donor's travel history is screened to minimize risk of transmitting infections, such as malaria. The donated blood is tested for signs of infectious diseases, such as HIV and hepatitis. The blood is then tested to be sure it is compatible with you in order to minimize the chance of a transfusion reaction. If you or a relative donates blood, this is often done  in anticipation of surgery and is not appropriate for emergency situations. It takes many days to process the donated blood. ?RISKS AND COMPLICATIONS ?Although transfusion therapy is very safe and saves many lives, the main dangers of transfusion include:  ?Getting an infectious disease. ?Developing a transfusion reaction. This is an allergic reaction to something in the blood you were given. Every precaution is taken to prevent this. ?The decision to have a blood transfusion has been considered carefully by your caregiver before blood is given. Blood is not given unless the benefits outweigh the risks. ? ?AFTER SURGERY INSTRUCTIONS ? ?Return to work: 4-6 weeks if applicable ? ?Activity: ?1. Be up and out of the bed during the day.  Take a nap if needed.  You may walk up steps but be careful and use the hand rail.  Stair climbing will tire you more than you think, you may need to stop part way and rest.  ? ?2. No lifting or straining for 6 weeks over 10 pounds. No pushing, pulling, straining for 6 weeks. ? ?3. No driving for around 1  week(s).  Do not drive if you are taking narcotic pain medicine and make sure that your reaction time has returned.  ? ?4. You can shower as soon as the next day after surgery. Shower daily.  Use your regular s

## 2021-10-03 NOTE — Telephone Encounter (Signed)
Per [facility] APP called and moved patient's appt with Dr Brantley Stage from 4/24 to tomorrow with PA tomorrow at 3:30 pm. Appt moved up due to incision drainage. Called the patient using [place] (ID [phone]) with the appt details  ?

## 2021-10-03 NOTE — Progress Notes (Addendum)
Patient here with her husband for a pre-operative appointment prior to her scheduled surgery on October 16, 2021. She is scheduled for robotic assisted bilateral salpingo-oophorectomy, possible staging, possible laparotomy.  She has not had her pre-admission testing appointment yet and will be contacted by the [facility] soon to arrange this. The surgery was discussed in detail.  See after visit summary for additional details. Visual aids used to discuss items related to surgery including sequential compression stockings, foley catheter, IV pump, multi-modal pain regimen including tylenol, photo of the surgical robot, female reproductive system to discuss surgery in detail.    ?  ?Discussed post-op pain management in detail including the aspects of the enhanced recovery pathway.  Advised her that a new prescription would be sent in for oxycodone and it is only to be used for after her upcoming surgery. Dosing adjusted based on kidney history and creatinine. We discussed the use of tylenol post-op and to monitor for a maximum of 4,000 mg in a 24 hour period.  Also prescribed sennakot to be used after surgery and to hold if having loose stools.  Discussed bowel regimen in detail.   ?  ?Discussed the use of heparin pre-op, SCDs, and measures to take at home to prevent DVT including frequent mobility.  Reportable signs and symptoms of DVT discussed. Post-operative instructions discussed and expectations for after surgery. Incisional care discussed as well including reportable signs and symptoms including erythema, drainage, wound separation.  ?   ?45 hour spent with the patient.  Verbalizing understanding of material discussed. No needs or concerns voiced at the end of the visit. Advised patient and family to call for any needs.  Advised that her post-operative medications had been prescribed and could be picked up at any time.   ? ?Sterilization form obtained with assistance of Junita Push., interpreter. Advised patient we would  reach out to Dr. Burr Medico about her Aromasin around the time of surgery and let her know the recommendations. Chest incisions assessed per patient request. Pictures in media. With light palpation of left chest incision closest to the midline, serosanguinous drainage expressed. Plan to recommend patient see her surgeon as soon as possible. Given slow wound healing, will discuss with Dr. Berline Lopes to see if the surgery date needs to be adjusted. The patient is also asking about her referral to [facility]. Advised her there were multiple messages about this appointment with the last stating the husband was to call back to schedule. Advised patient we would send another referral. Advised if surgery is recommended for her urinary symptoms of incontinence, her surgery scheduled for April 24 would most likely need to be adjusted if a joint procedure needed to take place. She is congratulated on her weight loss efforts and improvement in her A1C. ? ?50 mL/min: Creatinine clearance, original Cockcroft-Gault ?34 mL/min: Creatinine clearance modified for overweight patient, using adjusted body weight of 69 kg (152 lbs). ? ?This appointment is included in the global surgical bundle as pre-operative teaching and has no charge. Interpreter arrived at 11:27 am for the visit then RN to do med review.     ?

## 2021-10-03 NOTE — Telephone Encounter (Signed)
Fax records and referral form to [facility]  ?

## 2021-10-04 ENCOUNTER — Encounter: Payer: Self-pay | Admitting: Licensed Clinical Social Worker

## 2021-10-04 ENCOUNTER — Encounter (HOSPITAL_COMMUNITY): Payer: Medicare HMO

## 2021-10-04 DIAGNOSIS — C50312 Malignant neoplasm of lower-inner quadrant of left female breast: Secondary | ICD-10-CM

## 2021-10-04 NOTE — Progress Notes (Signed)
[place] CSW Progress Note ? ?Clinical Social Worker met with patient and Spanish Interpreter to assess financial concerns.  Pt reports she applied for the [employer] and was approved at the start of treatment; however, pt thought once she submitted her electric bill the [employer] would continue to pay the bill each month.  Pt states she received an electric bill for $800 which she does not have the funds to pay.  CSW spoke w/ Annamary Rummage who confirmed pt was approved for the [employer]; however, pt did not bring in additional electric bills so the original bill pt brought in was the only one paid.  Per [name] pt has an available balance of $488.17 which can be applied to bills.  CSW informed pt of the above and instructed pt to bring in the bill to submit which she verbalized agreement to.  CSW completed a Retail [employer] w/ pt and submitted it.  Pt also given a $50 Visa card from the [facility].  Contact information for CSW given to pt for future concerns.  CSW to remain available to address needs as they arise.   ? ? ? ?Cynthia [name] , LCSW ?

## 2021-10-04 NOTE — Telephone Encounter (Signed)
Spoke with [facility] office regarding her referral, the office has received the referral and is working on the appt  ?

## 2021-10-05 ENCOUNTER — Other Ambulatory Visit: Payer: Self-pay

## 2021-10-05 ENCOUNTER — Inpatient Hospital Stay (HOSPITAL_BASED_OUTPATIENT_CLINIC_OR_DEPARTMENT_OTHER): Payer: Medicare HMO | Admitting: Hematology

## 2021-10-05 ENCOUNTER — Inpatient Hospital Stay: Payer: Medicare HMO

## 2021-10-05 ENCOUNTER — Ambulatory Visit: Payer: Medicare HMO | Admitting: Physical Therapy

## 2021-10-05 ENCOUNTER — Encounter: Payer: Self-pay | Admitting: Hematology

## 2021-10-05 ENCOUNTER — Encounter (HOSPITAL_COMMUNITY)
Admission: RE | Admit: 2021-10-05 | Discharge: 2021-10-05 | Disposition: A | Discharge status: Home / Self Care | Payer: Medicare HMO | Source: Ambulatory Visit | Attending: Gynecologic Oncology | Admitting: Gynecologic Oncology

## 2021-10-05 VITALS — BP 165/69 | HR 80 | Temp 98.5°F | Resp 18 | Ht 61.0 in | Wt 192.2 lb

## 2021-10-05 DIAGNOSIS — Z1509 Genetic susceptibility to other malignant neoplasm: Secondary | ICD-10-CM | POA: Insufficient documentation

## 2021-10-05 DIAGNOSIS — Z170 Estrogen receptor positive status [ER+]: Secondary | ICD-10-CM | POA: Insufficient documentation

## 2021-10-05 DIAGNOSIS — Z1501 Genetic susceptibility to malignant neoplasm of breast: Secondary | ICD-10-CM | POA: Insufficient documentation

## 2021-10-05 DIAGNOSIS — C50312 Malignant neoplasm of lower-inner quadrant of left female breast: Secondary | ICD-10-CM | POA: Insufficient documentation

## 2021-10-05 DIAGNOSIS — Z9013 Acquired absence of bilateral breasts and nipples: Secondary | ICD-10-CM | POA: Diagnosis not present

## 2021-10-05 DIAGNOSIS — E1140 Type 2 diabetes mellitus with diabetic neuropathy, unspecified: Secondary | ICD-10-CM | POA: Diagnosis not present

## 2021-10-05 DIAGNOSIS — R229 Localized swelling, mass and lump, unspecified: Secondary | ICD-10-CM | POA: Diagnosis not present

## 2021-10-05 DIAGNOSIS — Z01812 Encounter for preprocedural laboratory examination: Secondary | ICD-10-CM | POA: Diagnosis not present

## 2021-10-05 DIAGNOSIS — R110 Nausea: Secondary | ICD-10-CM | POA: Diagnosis not present

## 2021-10-05 DIAGNOSIS — Z940 Kidney transplant status: Secondary | ICD-10-CM | POA: Diagnosis not present

## 2021-10-05 DIAGNOSIS — I10 Essential (primary) hypertension: Secondary | ICD-10-CM | POA: Diagnosis not present

## 2021-10-05 LAB — COMPREHENSIVE METABOLIC PANEL
ALT: 16 U/L (ref 0–44)
AST: 15 U/L (ref 15–41)
Albumin: 4.2 g/dL (ref 3.5–5.0)
Alkaline Phosphatase: 73 U/L (ref 38–126)
Anion gap: 4 — ABNORMAL LOW (ref 5–15)
BUN: 36 mg/dL — ABNORMAL HIGH (ref 6–20)
CO2: 28 mmol/L (ref 22–32)
Calcium: 10.4 mg/dL — ABNORMAL HIGH (ref 8.9–10.3)
Chloride: 109 mmol/L (ref 98–111)
Creatinine, Ser: 1.59 mg/dL — ABNORMAL HIGH (ref 0.44–1.00)
GFR, Estimated: 39 mL/min — ABNORMAL LOW (ref >60)
Glucose, Bld: 167 mg/dL — ABNORMAL HIGH (ref 70–99)
Potassium: 5 mmol/L (ref 3.5–5.1)
Sodium: 141 mmol/L (ref 135–145)
Total Bilirubin: 0.2 mg/dL — ABNORMAL LOW (ref 0.3–1.2)
Total Protein: 6.9 g/dL (ref 6.5–8.1)

## 2021-10-05 LAB — CBC
HCT: 37 % (ref 36.0–46.0)
Hemoglobin: 12 g/dL (ref 12.0–15.0)
MCH: 30.5 pg (ref 26.0–34.0)
MCHC: 32.4 g/dL (ref 30.0–36.0)
MCV: 93.9 fL (ref 80.0–100.0)
Platelets: 182 10*3/uL (ref 150–400)
RBC: 3.94 MIL/uL (ref 3.87–5.11)
RDW: 12.7 % (ref 11.5–15.5)
WBC: 5.7 10*3/uL (ref 4.0–10.5)
nRBC: 0 % (ref 0.0–0.2)

## 2021-10-05 MED ORDER — EXEMESTANE 25 MG PO TABS: 25.0000 mg | ORAL_TABLET | Freq: Every day | ORAL | 5 refills | Status: DC

## 2021-10-05 NOTE — Progress Notes (Signed)
?[place]   ?[phone] [phone]   ?Clinic Follow up Note  ? ?Patient Care Team: ?Arthur Holms, NP as PCP - General (Nurse Practitioner) ?Buford Dresser, MD as PCP - Cardiology (Cardiology) ?Rexene Agent, MD as Consulting Physician (Nephrology) ?Dwana Melena, MD as Referring Physician (Nephrology) ?Rockwell Germany, RN as Oncology Nurse Navigator ?Mauro Kaufmann, RN as Oncology Nurse Navigator ?Truitt Merle, MD as Consulting Physician (Hematology) ?Erroll Luna, MD as Consulting Physician (General Surgery) ?Eppie Gibson, MD as Attending Physician (Radiation Oncology) ? ?Date of Service:  10/05/2021 ? ?CHIEF COMPLAINT: f/u of left breast cancer, BRCA2+ ? ?CURRENT THERAPY:  ?Exemestane, started 08/2021 ? ?ASSESSMENT & PLAN:  ?Eleesha Purkey is a 51 y.o. post-hysterectomy female with  ? ?1. 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 Brantley Stage 03/02/21, surgical path showed IDC and DCIS, grade 2, negative nodes with close but clear margins. Ki-67 of 20%. Oncotype showed high risk. ?-She began adjuvant TC on 05/02/21 after delayed wound healing. She tolerated C1 poorly with nausea with vomiting and diarrhea. She was able to recover well. She experienced cystitis following C2 and C3. She managed to complete 4 cycles, but she has had a slow recovery. ?-she started exemestane in 08/2021. She is tolerating well with mild nausea. ?-labs reviewed, overall stable, no concerns. Will continue exemestane  ?-Her exam showed a 2 cm subcutaneous nodule above her right mastectomy site, possible related to surgery, will monitor.  If gets bigger, will get a biopsy to rule out local recurrence. ? ?2. Comorbidities: DM with neuropathy, HTN, s/p kidney transplant ?-continue medications and f/u ?-she has diabetic neuropathy in her feet at baseline, on gabapentin ?-On prednisone and  tacrolimus following right kidney transplant in 2017. ?  ?3. BRCA2 + pathogenic mutation  ?-Found on BRCA1/2 analysis and [facility] genetics panel from 02/22/21 ?-We previously reviewed the increased risk of recurrent breast and or contralateral breast cancer, ovarian cancer, pancreatic cancer, and melanoma ?-she underwent bilateral mastectomies on 08/22/21 by Dr. Brantley Stage, path was negative. She is currently looking into reconstruction. ?-she is scheduled for BSO (s/p partial hysterectomy in 2001) on 10/16/21. ?-her 3 daughters have been tested and all are negative. ?  ?  ?PLAN: ?-continue exemestane ?-BSO scheduled for 10/16/21 with Dr. Berline Lopes ?-lab and f/u in 4 months ?-will send a message to Dr. Brantley Stage monitor the subcutaneous nodule on her right chest wall, and biopsy if needed  ? ? ?No problem-specific Assessment & Plan notes found for this encounter. ? ? ?SUMMARY OF ONCOLOGIC HISTORY: ?Oncology History Overview Note  ?Cancer Staging ?Malignant neoplasm of lower-inner quadrant of left breast in female, estrogen receptor positive ([place]) ?Staging form: Breast, AJCC 8th Edition ?- Clinical stage from 02/09/2021: Stage IA (cT1c, cN0, cM0, G2, ER+, PR+, HER2-) - Signed by Truitt Merle, MD on 02/20/2021 ?Stage prefix: Initial diagnosis ?Histologic grading system: 3 grade system ?- Pathologic stage from 03/02/2021: Stage IA (pT2, pN0, cM0, G2, ER+, PR+, HER2-, Oncotype DX score: 26) - Signed by Truitt Merle, MD on 03/21/2021 ?Stage prefix: Initial diagnosis ?Multigene prognostic tests performed: Oncotype DX ?Recurrence score range: Greater than or equal to 11 ?Histologic grading system: 3 grade system ?Residual tumor (R): R0 - None ? ?  ?Malignant neoplasm of lower-inner quadrant of left breast in female, estrogen receptor positive ([place])  ?02/07/2021 Mammogram  ? Exam: Left Diagnostic Mammogram; Left Breast Ultrasound ? ?IMPRESSION: ?Irregular  mass in left breast at 6:30, measuring 1.6 cm by mammogram, is highly suggestive of malignancy.  Left axillary ultrasound is negative for lymphadenopathy. ?  ?02/09/2021 Cancer Staging  ? Staging form: Breast, AJCC 8th Edition ?- Clinical stage from 02/09/2021: Stage IA (cT1c, cN0, cM0, G2, ER+, PR+, HER2-) - Signed by Truitt Merle, MD on 02/20/2021 ?Stage prefix: Initial diagnosis ?Histologic grading system: 3 grade system ? ?  ?02/09/2021 Pathology Results  ? Diagnosis ?Breast, left, needle core biopsy, 6:30 o'clock 8cm fn ?- INVASIVE DUCTAL CARCINOMA WITH HISTIOCYTOID FEATURES. SEE NOTE ?Diagnosis Note ?Carcinoma measures 1 cm in greatest linear dimension and appears grade 2. ? ?PROGNOSTIC INDICATORS ?Results: ?IMMUNOHISTOCHEMICAL AND MORPHOMETRIC ANALYSIS PERFORMED MANUALLY ?The tumor cells are EQUIVOCAL for Her2 (2+). Her2 by FISH will be performed and results performed separately. ?Estrogen Receptor: >95%, POSITIVE, STRONG STAINING INTENSITY ?Progesterone Receptor: 90%, POSITIVE, STRONG STAINING INTENSITY ?Proliferation Marker Ki67: 20% ? ?FLUORESCENCE IN-SITU HYBRIDIZATION ?Results: ?GROUP 5: HER2 **NEGATIVE** ?Equivocal form of amplification of the HER2 gene was detected in the IHC 2+ tissue sample received from this ?individual. HER2 FISH was performed by a technologist and cell imaging and analysis on the [employer]. ?RATIO OF HER2/CEN17 SIGNALS 1.30 ?AVERAGE HER2 COPY NUMBER PER CELL 1.75 ?  ?02/15/2021 Initial Diagnosis  ? Malignant neoplasm of lower-inner quadrant of left breast in female, estrogen receptor positive ([place]) ?  ?03/01/2021 Genetic Testing  ? Positive genetic testing: pathogenic variant detected in BRCA2 at [id number].  No other pathogenic variants detected in [facility] Custom[facility] Panel.  The report date is 03/19/2021. ? ?The CustomNext-Cancer+RNAinsight panel offered by [facility] includes sequencing and rearrangement analysis for the following 47 genes:  APC, ATM, AXIN2, BARD1, BMPR1A, BRCA1, BRCA2, BRIP1, CDH1, CDK4, CDKN2A, CHEK2, DICER1, EPCAM, GREM1, HOXB13, MEN1, MLH1, MSH2, MSH3, MSH6,  MUTYH, NBN, NF1, NF2, NTHL1, PALB2, PMS2, POLD1, POLE, PTEN, RAD51C, RAD51D, RECQL, RET, SDHA, SDHAF2, SDHB, SDHC, SDHD, SMAD4, SMARCA4, STK11, TP53, TSC1, TSC2, and VHL.  RNA data is routinely analyzed for use in variant interpretation for all genes. ?  ?03/02/2021 Cancer Staging  ? Staging form: Breast, AJCC 8th Edition ?- Pathologic stage from 03/02/2021: Stage IA (pT2, pN0, cM0, G2, ER+, PR+, HER2-, Oncotype DX score: 26) - Signed by Truitt Merle, MD on 03/21/2021 ?Stage prefix: Initial diagnosis ?Multigene prognostic tests performed: Oncotype DX ?Recurrence score range: Greater than or equal to 11 ?Histologic grading system: 3 grade system ?Residual tumor (R): R0 - None ? ?  ?05/02/2021 - 07/12/2021 Chemotherapy  ? Patient is on Treatment Plan : BREAST TC q21d  ?  ?  ?08/22/2021 Pathology Results  ? FINAL MICROSCOPIC DIAGNOSIS:  ? ?A. BREAST, LEFT, MASTECTOMY:  ?- Fibrocystic changes.  ?- Lumpectomy scar with hemosiderin deposition.  ?- No malignancy identified.  ? ?B. BREAST, RIGHT, MASTECTOMY:  ?- Fibrocystic changes with focal fibroadenomatoid change.  ?- No malignancy identified.  ?  ? ? ? ?INTERVAL HISTORY:  ?Kaly Mcquary is here for a follow up of breast cancer. She was last seen by me on 08/07/21. She presents to the clinic accompanied by interpreter Almyra Free. ?She reports she is doing well overall except for nausea. ?She tells me she is hoping to pursue reconstruction but was told she wouldn't be able to do flap surgery due to prior abdominal surgeries. So she is looking for another Psychiatric nurse. ?  ?All other systems were reviewed with the patient and are negative. ? ?MEDICAL HISTORY:  ?Past Medical History:  ?Diagnosis Date  ? Allergy   ? pollen  ?  Anemia   ? Anxiety   ? BRCA2 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 03/01/2021  ? Breast cancer ([place])   ? Cataract   ? surgical repair bilateral  ? Coronary artery disease   ? Depression   ? ESRD on hemodialysis ([place])   ? Home HD 5x per week- not on dialysis now had  tramsplant 4/17  ? Family history of ovarian cancer 02/22/2021  ? GERD (gastroesophageal reflux disease)   ? Hearing loss 2017  ? right ear  ? History of blood transfusion   ? transfusion reaction  ? Hyperlip

## 2021-10-05 NOTE — Progress Notes (Signed)
Called by blood bank pt has antibodies, redraw t & s  day of surgery, order entered in epic. ?

## 2021-10-06 ENCOUNTER — Encounter: Payer: Self-pay | Admitting: Hematology

## 2021-10-10 ENCOUNTER — Telehealth: Payer: Self-pay | Admitting: *Deleted

## 2021-10-10 ENCOUNTER — Ambulatory Visit: Payer: Medicare HMO | Admitting: Rehabilitation

## 2021-10-10 NOTE — Telephone Encounter (Signed)
Received surgical optimization form from patient's PCP; fax copy to pre admission office, copy sent to be scanned and copy in placed in chart  ?

## 2021-10-11 ENCOUNTER — Telehealth: Payer: Self-pay

## 2021-10-11 ENCOUNTER — Encounter (HOSPITAL_COMMUNITY): Payer: Self-pay

## 2021-10-11 NOTE — Telephone Encounter (Signed)
Attempted to contact patient to discuss the need to delay her surgery due to her recent A1C results and delayed wound healing. Unable to contact patient. Left message with assistance of interpreter Delana Meyer [phone]), requested return call.  ?

## 2021-10-12 ENCOUNTER — Encounter: Payer: Self-pay | Admitting: Hematology

## 2021-10-12 ENCOUNTER — Ambulatory Visit: Payer: Medicare HMO | Admitting: Rehabilitation

## 2021-10-12 ENCOUNTER — Encounter: Payer: Self-pay | Admitting: Rehabilitation

## 2021-10-12 DIAGNOSIS — M6281 Muscle weakness (generalized): Secondary | ICD-10-CM

## 2021-10-12 DIAGNOSIS — R293 Abnormal posture: Secondary | ICD-10-CM

## 2021-10-12 DIAGNOSIS — Z483 Aftercare following surgery for neoplasm: Secondary | ICD-10-CM

## 2021-10-12 DIAGNOSIS — Z170 Estrogen receptor positive status [ER+]: Secondary | ICD-10-CM

## 2021-10-12 NOTE — Therapy (Signed)
?OUTPATIENT PHYSICAL THERAPY TREATMENT NOTE ? ? ?Patient Name: Cynthia [name] ?MRN: [id number] ?DOB:July 27, 1970, 51 y.o., female ?Today's Date: 10/13/2021 ? ?PCP: Arthur Holms, NP ?REFERRING PROVIDER: Truitt Merle, MD ? ? PT End of Session - 10/12/21 1332   ? ? Visit Number 10   ? Number of Visits 18   ? Date for PT Re-Evaluation 11/24/21   ? Authorization Type ok12 visits 08/14/21-10/09/21   ? PT Start Time 1335   ? PT Stop Time 1430   ? PT Time Calculation (min) 55 min   ? Activity Tolerance Patient tolerated treatment well   ? Behavior During Therapy [facility] for tasks assessed/performed   ? ?  ?  ? ?  ? ? ? ? ?Past Medical History:  ?Diagnosis Date  ? Allergy   ? pollen  ? Anemia   ? Anxiety   ? BRCA2 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 03/01/2021  ? Breast cancer ([place])   ? Cataract   ? surgical repair bilateral  ? Coronary artery disease   ? Depression   ? ESRD on hemodialysis ([place])   ? Home HD 5x per week- not on dialysis now had tramsplant 4/17  ? Family history of ovarian cancer 02/22/2021  ? GERD (gastroesophageal reflux disease)   ? Hearing loss 2017  ? right ear  ? History of blood transfusion   ? transfusion reaction  ? Hyperlipidemia   ? Hypertension   ? Insulin-dependent diabetes mellitus with renal complications   ? Type I beginning now type II per pt-dr levy also II; Per patient 08/18/21 she is Type 2  ? Kidney transplant recipient   ? Neuromuscular disorder ([place])   ? NEUROPATHY  ? Sleep apnea   ? ?Past Surgical History:  ?Procedure Laterality Date  ? ABDOMINAL HYSTERECTOMY    ? BREAST EXCISIONAL BIOPSY Right 08/2015  ? BREAST LUMPECTOMY WITH RADIOACTIVE SEED LOCALIZATION Right 09/14/2015  ? Procedure: RIGHT BREAST LUMPECTOMY WITH RADIOACTIVE SEED LOCALIZATION;  Surgeon: Donnie Mesa, MD;  Location: [place];  Service: General;  Laterality: Right;  ? BREAST LUMPECTOMY WITH RADIOACTIVE SEED LOCALIZATION Left 03/02/2021  ? Procedure: LEFT BREAST SEED LUMPECTOMY LEFT SENTINEL LYMPH NODE MAPPING;   Surgeon: Erroll Luna, MD;  Location: [place];  Service: General;  Laterality: Left;  GEN AND PEC BLOCK  ? BREAST SURGERY Bilateral   ? biopsy bilateral  ? CARDIAC CATHETERIZATION    ? CATARACT EXTRACTION Bilateral   ? bilateral  ? CHOLECYSTECTOMY    ? CYST REMOVAL NECK    ? DIALYSIS FISTULA CREATION Left   ? EYE SURGERY Bilateral   ? lazer  ? kidney transplant    ? LEFT HEART CATH AND CORONARY ANGIOGRAPHY N/A 03/04/2019  ? Procedure: LEFT HEART CATH AND CORONARY ANGIOGRAPHY;  Surgeon: Martinique, Peter M, MD;  Location: [place] CV [facility];  Service: Cardiovascular;  Laterality: N/A;  ? LEFT HEART CATHETERIZATION WITH CORONARY ANGIOGRAM N/A 03/30/2014  ? Procedure: LEFT HEART CATHETERIZATION WITH CORONARY ANGIOGRAM;  Surgeon: Sinclair Grooms, MD;  Location: [facility] CATH LAB;  Service: Cardiovascular;  Laterality: N/A;  ? LIPOMA EXCISION N/A 02/06/2017  ? Procedure: EXCISION POSTERIOR NECK SEBACEOUS CYST;  Surgeon: Coralie Keens, MD;  Location: [place];  Service: General;  Laterality: N/A;  ? 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  ? Procedure: REPAIR OF ARTERIOVENOUS FISTULA ANEURYSM;  Surgeon: Serafina Mitchell, MD;  Location: [place];  Service: Vascular;  Laterality: Left;  ? REVISON OF ARTERIOVENOUS FISTULA Left 09/28/2013  ?  Procedure: EXCISE ESCHAR LEFT ARM  ARTERIOVENOUS FISTULA WITH PLICATION OF LEFT ARM ARTERIOVENOUS FISTULA;  Surgeon: Elam Dutch, MD;  Location: [place];  Service: Vascular;  Laterality: Left;  ? REVISON OF ARTERIOVENOUS FISTULA Left 08/26/2015  ? Procedure: RESECTION ANEURYSM OF LEFT ARM ARTERIOVENOUS FISTULA  ;  Surgeon: Serafina Mitchell, MD;  Location: [place];  Service: Vascular;  Laterality: Left;  ? SIMPLE MASTECTOMY WITH AXILLARY SENTINEL NODE BIOPSY Bilateral 08/22/2021  ? Procedure: BILATERAL SIMPLE MASTECTOMY;  Surgeon: Erroll Luna, MD;  Location: [place];  Service: General;  Laterality: Bilateral;  ? TUBAL LIGATION    ? ?Patient Active Problem  List  ? Diagnosis Date Noted  ? Fever 08/24/2021  ? AKI (acute kidney injury/renal transplant on immunosuppresants  08/24/2021  ? BRCA2 gene mutation positive s/p bilateral masectomy  08/22/2021  ? Port-A-Cath in place 07/12/2021  ? Hyponatremia 07/03/2021  ? UTI (urinary tract infection) 07/02/2021  ? Leukocytosis 06/13/2021  ? Complicated UTI (urinary tract infection) 06/13/2021  ? PICC (peripherally inserted central catheter) in place 06/13/2021  ? Stage 3a chronic kidney disease (CKD) ([place]) 05/28/2021  ? Thrombocytopenia ([place]) 05/28/2021  ? Aortic atherosclerosis ([place]) 03/07/2021  ? Hypercholesterolemia 03/07/2021  ? BRCA2 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 03/01/2021  ? Genetic testing 03/01/2021  ? Family history of ovarian cancer 02/22/2021  ? Malignant neoplasm of lower-inner quadrant of left breast in female, estrogen receptor positive ([place]) 02/15/2021  ? Foot pain, bilateral 07/28/2020  ? COVID-19 virus infection 06/16/2019  ? Insulin dependent type 2 diabetes mellitus ([place]) 06/06/2019  ? Hyperglycemia 06/02/2019  ? DKA (diabetic ketoacidoses) 06/02/2019  ? HTN (hypertension) 03/04/2019  ? Abnormal nuclear stress test 03/04/2019  ? Diabetic nephropathy ([place]) 03/03/2019  ? History of renal transplant 11/18/2015  ? Diabetic retinopathy associated with type 2 diabetes mellitus ([place]) 04/13/2015  ? Postprandial vomiting   ? 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  ? Anemia 01/04/2015  ? Pseudoaneurysm of arteriovenous dialysis fistula ([place]) 03/16/2014  ? Hyperkalemia 11/16/2013  ? Chronic constipation 07/28/2013  ? Paresthesias 07/28/2013  ? Diabetes mellitus type I ([place])   ? Kidney transplant status 05/28/2013  ? 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 Precordial chest pain 02/10/2013  ? Awaiting organ transplant 09/19/2012  ? ? ?REFERRING DIAG: neuropathy and breast cancer ? ?THERAPY DIAG:  ?Aftercare following surgery for neoplasm ? ?Muscle weakness (generalized) ? ?Malignant neoplasm of lower-inner quadrant of left breast in  female, estrogen receptor positive ([place]) ? ?Abnormal posture ? ?PERTINENT HISTORY: PERTINENT HISTORY:  ?Left breast cancer s/p left lumpectomy 03/02/21 due to grade 2 IDC with negative LN.  Due to high risk oncotype pt started adjuvant TC after delayed wound healing. bil mastectomy with Dr. Brantley Stage 08/22/21. On gabapentin for diabetic neuropathy and long term steroids for kidney transplant. Other history includes: CAD, type 1 DM, HTN, s/p kidney transplant 2017.  ? ?PRECAUTIONS: PRECAUTIONS: Other: lymphedema risk Lt UE ?  ?WEIGHT BEARING RESTRICTIONS No ? ?SUBJECTIVE:   They cancelled my surgery bc of wound healing and high blood sugar ? ?PAIN:  ?No ? ? ?TREATMENT  ?10/12/21 ?Reassessed goals and mobility with MMT x 65mn ? ?LAQ 3#  2x 10 bil ?Seated march 2# 2x 10 bil seated on wobble disc with no hands ? ? ?Supine: bridge 2x10 with ball hold between knees ? Hip abduction red band  2x 10 with TrA cueing ? PPT with TrA bracing 6' x 10 ? TrA with march 2x 10 bil ? LTR x 5 each ?  ?In //  bars:   ? Standing on black foam    ?Tandem stance x 20" bil x 2 ? SLS max x 3 bil ? ? ?09/28/21 ?Seated LAQ 2#  2x 10 bil ?Seated march 2# 2x 10 bil seated on wobble disc with no hands ?Sit to stand  2x 10 ?Step up 6" x 10 bil ?Supine: bridge x 10 ? Hip abduction red band x 10 ? Ball squeeze 3" x 10 ? PPT with TrA bracing 6' x 10 ? TrA with march x 10 bil ? LTR x 5 each ?  ? ?In // bars:  hip abduction 2# x 10 bil ?  Hip flexion 2# x 10 bil ?  Hip extension 2# x 10 bil ? ?  Standing on black foam    ?Tandem stance x 20" bil x 2 ?   SLS max x 3 bil ? ?  On black rocker board : stance and forward backward rock  - easy ? ?Sent note to Dr. Brantley Stage regarding poor wound healing status of bil mastectomy incisions as pt has had wound complications in the past - see photos for status ? ?09/26/21 ?Held on nustep due to someincreased back pain last time ?Seated LAQ 2#  2x 10 bil ?Seated march 2# 2x 10 bil seated on wobble disc with no hands ?Sit to  stand  2x 10 ?Step up 6" x 10 bil ? ?In // bars:  Standing on foam oval  hip abduction 2# x 10 bil ?      Hip flexion 2# x 10 bil ?      Hip extension 2# x 10 bil ? ?  Standing on black foam  EC 3x15" ?      T

## 2021-10-12 NOTE — Telephone Encounter (Signed)
Spoke with Cynthia [name] with the assistance of interpreter Kennon Rounds [phone]) today to let her know that her surgery for 10/16/21 has been canceled due to per Joylene John, NP her recent A1C with her PCP is 8.8. Having an elevated glucose readings can increase her chances of complications including infections, etc. Given the delayed healing of her breast incisions and A1C being over 8 along with the fact our surgery is not considered an emergency, Dr.Tucker wants to delay surgery until her A1C is less than 8.  She needs to work on improving glucose levels and keep a log. Pt asked when is her new surgery date. Informed pt that she would need to improve her blood sugars, follow up with her PCP and get a new A1C drawn before we can schedule a new surgery date. Pt stated that she thought she needed to have surgery quickly due to the risk of having cancer. Informed pt that with an A1C that high and her delay healing of her breast incision, it poses a lot of complications. It would be best to get her blood sugars controlled before surgery. She verbalized understanding.  ?

## 2021-10-13 ENCOUNTER — Telehealth: Payer: Self-pay | Admitting: *Deleted

## 2021-10-13 NOTE — Telephone Encounter (Signed)
Spoke with pt this afternoon to inform her that Cynthia John, NP would like a phone visit with her to follow up on her blood sugar logs. Pt was agreeable to a phone appt on 11/14/21 @ 11am. NP notified.  ?

## 2021-10-16 ENCOUNTER — Ambulatory Visit (HOSPITAL_BASED_OUTPATIENT_CLINIC_OR_DEPARTMENT_OTHER): Admission: RE | Admit: 2021-10-16 | Payer: Medicare HMO | Source: Home / Self Care | Admitting: Gynecologic Oncology

## 2021-10-16 DIAGNOSIS — Z01818 Encounter for other preprocedural examination: Secondary | ICD-10-CM

## 2021-10-16 DIAGNOSIS — Z170 Estrogen receptor positive status [ER+]: Secondary | ICD-10-CM

## 2021-10-16 DIAGNOSIS — Z1501 Genetic susceptibility to malignant neoplasm of breast: Secondary | ICD-10-CM

## 2021-10-16 SURGERY — SALPINGO-OOPHORECTOMY, BILATERAL, ROBOT-ASSISTED
Anesthesia: General

## 2021-10-17 ENCOUNTER — Encounter: Payer: Self-pay | Admitting: Rehabilitation

## 2021-10-17 ENCOUNTER — Encounter: Payer: Medicare HMO | Admitting: Rehabilitation

## 2021-10-17 ENCOUNTER — Ambulatory Visit: Payer: Medicare HMO | Admitting: Rehabilitation

## 2021-10-17 DIAGNOSIS — Z1509 Genetic susceptibility to other malignant neoplasm: Secondary | ICD-10-CM

## 2021-10-17 DIAGNOSIS — R293 Abnormal posture: Secondary | ICD-10-CM

## 2021-10-17 DIAGNOSIS — C50312 Malignant neoplasm of lower-inner quadrant of left female breast: Secondary | ICD-10-CM

## 2021-10-17 DIAGNOSIS — M6281 Muscle weakness (generalized): Secondary | ICD-10-CM | POA: Diagnosis not present

## 2021-10-17 DIAGNOSIS — Z483 Aftercare following surgery for neoplasm: Secondary | ICD-10-CM

## 2021-10-17 DIAGNOSIS — Z170 Estrogen receptor positive status [ER+]: Secondary | ICD-10-CM

## 2021-10-17 LAB — TYPE AND SCREEN
ABO/RH(D): O POS
Antibody Screen: NEGATIVE
Unit division: 0
Unit division: 0

## 2021-10-17 LAB — BPAM RBC
Blood Product Expiration Date: 202305222359
Blood Product Expiration Date: 202305222359
Unit Type and Rh: 5100
Unit Type and Rh: 5100

## 2021-10-17 NOTE — Therapy (Signed)
?OUTPATIENT PHYSICAL THERAPY TREATMENT NOTE ? ? ?Patient Name: Cynthia [name] ?MRN: [id number] ?DOB:05/26/1971, 51 y.o., female ?Today's Date: 10/17/2021 ? ?PCP: Arthur Holms, NP ?REFERRING PROVIDER: Truitt Merle, MD ? ? PT End of Session - 10/17/21 1258   ? ? Visit Number 11   ? Number of Visits 18   ? Date for PT Re-Evaluation 11/24/21   ? Authorization Type 9 visits 10/12/21-11/24/21   ? Authorization - Visit Number 1   ? Authorization - Number of Visits 9   ? PT Start Time 1300   ? PT Stop Time 1346   ? PT Time Calculation (min) 46 min   ? Activity Tolerance Patient tolerated treatment well   ? Behavior During Therapy [facility] for tasks assessed/performed   ? ?  ?  ? ?  ? ? ? ? ?Past Medical History:  ?Diagnosis Date  ? Allergy   ? pollen  ? Anemia   ? Anxiety   ? BRCA2 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 03/01/2021  ? Breast cancer ([place])   ? Cataract   ? surgical repair bilateral  ? Coronary artery disease   ? Depression   ? ESRD on hemodialysis ([place])   ? Home HD 5x per week- not on dialysis now had tramsplant 4/17  ? Family history of ovarian cancer 02/22/2021  ? GERD (gastroesophageal reflux disease)   ? Hearing loss 2017  ? right ear  ? History of blood transfusion   ? transfusion reaction  ? Hyperlipidemia   ? Hypertension   ? Insulin-dependent diabetes mellitus with renal complications   ? Type I beginning now type II per pt-dr levy also II; Per patient 08/18/21 she is Type 2  ? Kidney transplant recipient   ? Neuromuscular disorder ([place])   ? NEUROPATHY  ? Sleep apnea   ? ?Past Surgical History:  ?Procedure Laterality Date  ? ABDOMINAL HYSTERECTOMY    ? BREAST EXCISIONAL BIOPSY Right 08/2015  ? BREAST LUMPECTOMY WITH RADIOACTIVE SEED LOCALIZATION Right 09/14/2015  ? Procedure: RIGHT BREAST LUMPECTOMY WITH RADIOACTIVE SEED LOCALIZATION;  Surgeon: Donnie Mesa, MD;  Location: [place];  Service: General;  Laterality: Right;  ? BREAST LUMPECTOMY WITH RADIOACTIVE SEED LOCALIZATION Left 03/02/2021  ?  Procedure: LEFT BREAST SEED LUMPECTOMY LEFT SENTINEL LYMPH NODE MAPPING;  Surgeon: Erroll Luna, MD;  Location: [place];  Service: General;  Laterality: Left;  GEN AND PEC BLOCK  ? BREAST SURGERY Bilateral   ? biopsy bilateral  ? CARDIAC CATHETERIZATION    ? CATARACT EXTRACTION Bilateral   ? bilateral  ? CHOLECYSTECTOMY    ? CYST REMOVAL NECK    ? DIALYSIS FISTULA CREATION Left   ? EYE SURGERY Bilateral   ? lazer  ? kidney transplant    ? LEFT HEART CATH AND CORONARY ANGIOGRAPHY N/A 03/04/2019  ? Procedure: LEFT HEART CATH AND CORONARY ANGIOGRAPHY;  Surgeon: Martinique, Peter M, MD;  Location: [place] CV LAB;  Service: Cardiovascular;  Laterality: N/A;  ? LEFT HEART CATHETERIZATION WITH CORONARY ANGIOGRAM N/A 03/30/2014  ? Procedure: LEFT HEART CATHETERIZATION WITH CORONARY ANGIOGRAM;  Surgeon: Sinclair Grooms, MD;  Location: [facility] CATH LAB;  Service: Cardiovascular;  Laterality: N/A;  ? LIPOMA EXCISION N/A 02/06/2017  ? Procedure: EXCISION POSTERIOR NECK SEBACEOUS CYST;  Surgeon: Coralie Keens, MD;  Location: [place];  Service: General;  Laterality: N/A;  ? 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  ? Procedure: REPAIR OF ARTERIOVENOUS FISTULA ANEURYSM;  Surgeon: Serafina Mitchell, MD;  Location: [facility];  Service: Vascular;  Laterality: Left;  ? REVISON OF ARTERIOVENOUS FISTULA Left 09/28/2013  ? Procedure: EXCISE ESCHAR LEFT ARM  ARTERIOVENOUS FISTULA WITH PLICATION OF LEFT ARM ARTERIOVENOUS FISTULA;  Surgeon: Elam Dutch, MD;  Location: [place];  Service: Vascular;  Laterality: Left;  ? REVISON OF ARTERIOVENOUS FISTULA Left 08/26/2015  ? Procedure: RESECTION ANEURYSM OF LEFT ARM ARTERIOVENOUS FISTULA  ;  Surgeon: Serafina Mitchell, MD;  Location: [place];  Service: Vascular;  Laterality: Left;  ? SIMPLE MASTECTOMY WITH AXILLARY SENTINEL NODE BIOPSY Bilateral 08/22/2021  ? Procedure: BILATERAL SIMPLE MASTECTOMY;  Surgeon: Erroll Luna, MD;  Location: [place];  Service: General;   Laterality: Bilateral;  ? TUBAL LIGATION    ? ?Patient Active Problem List  ? Diagnosis Date Noted  ? Fever 08/24/2021  ? AKI (acute kidney injury/renal transplant on immunosuppresants  08/24/2021  ? BRCA2 gene mutation positive s/p bilateral masectomy  08/22/2021  ? Port-A-Cath in place 07/12/2021  ? Hyponatremia 07/03/2021  ? UTI (urinary tract infection) 07/02/2021  ? Leukocytosis 06/13/2021  ? Complicated UTI (urinary tract infection) 06/13/2021  ? PICC (peripherally inserted central catheter) in place 06/13/2021  ? Stage 3a chronic kidney disease (CKD) ([place]) 05/28/2021  ? Thrombocytopenia ([place]) 05/28/2021  ? Aortic atherosclerosis ([place]) 03/07/2021  ? Hypercholesterolemia 03/07/2021  ? BRCA2 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 03/01/2021  ? Genetic testing 03/01/2021  ? Family history of ovarian cancer 02/22/2021  ? Malignant neoplasm of lower-inner quadrant of left breast in female, estrogen receptor positive ([place]) 02/15/2021  ? Foot pain, bilateral 07/28/2020  ? COVID-19 virus infection 06/16/2019  ? Insulin dependent type 2 diabetes mellitus ([place]) 06/06/2019  ? Hyperglycemia 06/02/2019  ? DKA (diabetic ketoacidoses) 06/02/2019  ? HTN (hypertension) 03/04/2019  ? Abnormal nuclear stress test 03/04/2019  ? Diabetic nephropathy ([place]) 03/03/2019  ? History of renal transplant 11/18/2015  ? Diabetic retinopathy associated with type 2 diabetes mellitus ([place]) 04/13/2015  ? Postprandial vomiting   ? 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  ? Anemia 01/04/2015  ? Pseudoaneurysm of arteriovenous dialysis fistula ([place]) 03/16/2014  ? Hyperkalemia 11/16/2013  ? Chronic constipation 07/28/2013  ? Paresthesias 07/28/2013  ? Diabetes mellitus type I ([place])   ? Kidney transplant status 05/28/2013  ? 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 Precordial chest pain 02/10/2013  ? Awaiting organ transplant 09/19/2012  ? ? ?REFERRING DIAG: neuropathy and breast cancer ? ?THERAPY DIAG:  ?Aftercare following surgery for neoplasm ? ?Muscle weakness  (generalized) ? ?Malignant neoplasm of lower-inner quadrant of left breast in female, estrogen receptor positive ([place]) ? ?Abnormal posture ? ?PERTINENT HISTORY: PERTINENT HISTORY:  ?Left breast cancer s/p left lumpectomy 03/02/21 due to grade 2 IDC with negative LN.  Due to high risk oncotype pt started adjuvant TC after delayed wound healing. bil mastectomy with Dr. Brantley Stage 08/22/21. On gabapentin for diabetic neuropathy and long term steroids for kidney transplant. Other history includes: CAD, type 1 DM, HTN, s/p kidney transplant 2017.  ? ?PRECAUTIONS: PRECAUTIONS: Other: lymphedema risk Lt UE ?  ?WEIGHT BEARING RESTRICTIONS No ? ?SUBJECTIVE:   I am doing good today  ? ?PAIN:  ?No ? ? ?TREATMENT  ?10/17/21 ?LAQ 3#  2x 10 bil ?Seated march 2# 2x 10 bil seated on wobble disc with no hands ? ?Supine: bridgex10 with feet on wobble disc ? Hip abduction red band  2x 10 with TrA cueing ? TrA with march 2x 10 bil ? LTR x 5 each ? Hooklying ball squeeze purple x 15 ? SLR x  10 bil ?Hooklying ABDuction red band bil x 15 and then single leg x 15 each ?   ?In // bars:   ? Standing on black foam with head turns Rt/Lt and EO/EC x 10 sec each x 4   ?Tandem stance x 20" bil x 2 on black foam ? SLS with star taps no bar assistance ? Balance beam heel to toe walk x 4 lengths ? Balance beam side steps x 4 lengths ?Outside walk including flat and slanted, step up curbs x 10 bil, side steps and walking on the grass  ?  ? ? ?10/12/21 ?Reassessed goals and mobility with MMT x 59mn ? ?LAQ 3#  2x 10 bil ?Seated march 2# 2x 10 bil seated on wobble disc with no hands ? ? ?Supine: bridge 2x10 with ball hold between knees ? Hip abduction red band  2x 10 with TrA cueing ? PPT with TrA bracing 6' x 10 ? TrA with march 2x 10 bil ? LTR x 5 each ?  ?In // bars:   ? Standing on black foam    ?Tandem stance x 20" bil x 2 ? SLS max x 3 bil ? ? ?09/28/21 ?Seated LAQ 2#  2x 10 bil ?Seated march 2# 2x 10 bil seated on wobble disc with no hands ?Sit to stand   2x 10 ?Step up 6" x 10 bil ?Supine: bridge x 10 ? Hip abduction red band x 10 ? Ball squeeze 3" x 10 ? PPT with TrA bracing 6' x 10 ? TrA with march x 10 bil ? LTR x 5 each ?  ? ?In // bars:  hip abduction 2# x 10 b

## 2021-10-19 ENCOUNTER — Telehealth: Payer: Self-pay | Admitting: *Deleted

## 2021-10-19 ENCOUNTER — Encounter: Payer: Medicare HMO | Admitting: Rehabilitation

## 2021-10-19 NOTE — Telephone Encounter (Signed)
Spoke with pt today with the assistance of [place] interpreter [phone]) to inform pt that we are holding a surgery spot on 11/29/21. She may  a call from the the hospital. She needs to keep working hard on her blood sugar levels and keep the 1 month phone visit with Joylene John, NP on 5/23 to review glucose reading. She verbalized understanding.  ?

## 2021-10-20 ENCOUNTER — Telehealth: Payer: Self-pay

## 2021-10-20 NOTE — Telephone Encounter (Signed)
Spoke with Cynthia [name] this morning with the assistance of [place] interpreter (Darlin [phone]). Notified patient that we are holding a surgery spot for her on 11/29/21. She may receive a call from the hospital to arrange a pre-admissions appointment.  Instructed that she needs to keep working on her blood sugar levels and keep a log of her glucose readings. She has a phone visit scheduled with Cynthia John, Cynthia [name] on 11/14/21 at 11am to review her glucose log. Patient reports she does have a glucose meter and will keep a log.  ?Advised patient that per Dr. Burr Medico she can continue taking Aromasin through the surgery, no need to stop it.  ?Patient verbalized understanding of above information and did not voice any questions. Instructed to call with any needs.  ? ?

## 2021-10-24 ENCOUNTER — Encounter: Payer: Self-pay | Admitting: Rehabilitation

## 2021-10-24 ENCOUNTER — Ambulatory Visit: Payer: Medicare HMO | Attending: Hematology | Admitting: Rehabilitation

## 2021-10-24 DIAGNOSIS — C50312 Malignant neoplasm of lower-inner quadrant of left female breast: Secondary | ICD-10-CM | POA: Diagnosis present

## 2021-10-24 DIAGNOSIS — Z483 Aftercare following surgery for neoplasm: Secondary | ICD-10-CM | POA: Insufficient documentation

## 2021-10-24 DIAGNOSIS — R293 Abnormal posture: Secondary | ICD-10-CM | POA: Insufficient documentation

## 2021-10-24 DIAGNOSIS — Z170 Estrogen receptor positive status [ER+]: Secondary | ICD-10-CM | POA: Diagnosis present

## 2021-10-24 DIAGNOSIS — R2689 Other abnormalities of gait and mobility: Secondary | ICD-10-CM | POA: Diagnosis present

## 2021-10-24 DIAGNOSIS — M6281 Muscle weakness (generalized): Secondary | ICD-10-CM | POA: Diagnosis present

## 2021-10-24 NOTE — Therapy (Signed)
?OUTPATIENT PHYSICAL THERAPY TREATMENT NOTE ? ? ?Patient Name: Cynthia [name] ?MRN: [id number] ?DOB:[birth year], 51 y.o., female ?Today's Date: 10/24/2021 ? ?PCP: Arthur Holms, NP ?REFERRING PROVIDER: Arthur Holms, NP ? ? PT End of Session - 10/24/21 1539   ? ? Visit Number 12   ? Number of Visits 18   ? Date for PT Re-Evaluation 11/24/21   ? Authorization Type 9 visits 10/12/21-11/24/21   ? Authorization - Visit Number 1   ? Authorization - Number of Visits 9   ? PT Start Time 9470   ? PT Stop Time 9628   ? PT Time Calculation (min) 33 min   ? Activity Tolerance Patient tolerated treatment well   ? Behavior During Therapy [facility] for tasks assessed/performed   ? ?  ?  ? ?  ? ? ? ? ? ?Past Medical History:  ?Diagnosis Date  ? Allergy   ? pollen  ? Anemia   ? Anxiety   ? BRCA2 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 03/01/2021  ? Breast cancer ([place])   ? Cataract   ? surgical repair bilateral  ? Coronary artery disease   ? Depression   ? ESRD on hemodialysis ([place])   ? Home HD 5x per week- not on dialysis now had tramsplant 4/17  ? Family history of ovarian cancer 02/22/2021  ? GERD (gastroesophageal reflux disease)   ? Hearing loss 2017  ? right ear  ? History of blood transfusion   ? transfusion reaction  ? Hyperlipidemia   ? Hypertension   ? Insulin-dependent diabetes mellitus with renal complications   ? Type I beginning now type II per pt-dr levy also II; Per patient 08/18/21 she is Type 2  ? Kidney transplant recipient   ? Neuromuscular disorder ([place])   ? NEUROPATHY  ? Sleep apnea   ? ?Past Surgical History:  ?Procedure Laterality Date  ? ABDOMINAL HYSTERECTOMY    ? BREAST EXCISIONAL BIOPSY Right 08/2015  ? BREAST LUMPECTOMY WITH RADIOACTIVE SEED LOCALIZATION Right 09/14/2015  ? Procedure: RIGHT BREAST LUMPECTOMY WITH RADIOACTIVE SEED LOCALIZATION;  Surgeon: Donnie Mesa, MD;  Location: [place];  Service: General;  Laterality: Right;  ? BREAST LUMPECTOMY WITH RADIOACTIVE SEED LOCALIZATION Left 03/02/2021  ?  Procedure: LEFT BREAST SEED LUMPECTOMY LEFT SENTINEL LYMPH NODE MAPPING;  Surgeon: Erroll Luna, MD;  Location: [place];  Service: General;  Laterality: Left;  GEN AND PEC BLOCK  ? BREAST SURGERY Bilateral   ? biopsy bilateral  ? CARDIAC CATHETERIZATION    ? CATARACT EXTRACTION Bilateral   ? bilateral  ? CHOLECYSTECTOMY    ? CYST REMOVAL NECK    ? DIALYSIS FISTULA CREATION Left   ? EYE SURGERY Bilateral   ? lazer  ? kidney transplant    ? LEFT HEART CATH AND CORONARY ANGIOGRAPHY N/A 03/04/2019  ? Procedure: LEFT HEART CATH AND CORONARY ANGIOGRAPHY;  Surgeon: Martinique, Peter M, MD;  Location: [place] CV LAB;  Service: Cardiovascular;  Laterality: N/A;  ? LEFT HEART CATHETERIZATION WITH CORONARY ANGIOGRAM N/A 03/30/2014  ? Procedure: LEFT HEART CATHETERIZATION WITH CORONARY ANGIOGRAM;  Surgeon: Sinclair Grooms, MD;  Location: [facility] CATH LAB;  Service: Cardiovascular;  Laterality: N/A;  ? LIPOMA EXCISION N/A 02/06/2017  ? Procedure: EXCISION POSTERIOR NECK SEBACEOUS CYST;  Surgeon: Coralie Keens, MD;  Location: [place];  Service: General;  Laterality: N/A;  ? 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  ? Procedure: REPAIR OF ARTERIOVENOUS FISTULA ANEURYSM;  Surgeon: Serafina Mitchell, MD;  [facility];  Service: Vascular;  Laterality: Left;  ? REVISON OF ARTERIOVENOUS FISTULA Left 09/28/2013  ? Procedure: EXCISE ESCHAR LEFT ARM  ARTERIOVENOUS FISTULA WITH PLICATION OF LEFT ARM ARTERIOVENOUS FISTULA;  Surgeon: Elam Dutch, MD;  Location: [place];  Service: Vascular;  Laterality: Left;  ? REVISON OF ARTERIOVENOUS FISTULA Left 08/26/2015  ? Procedure: RESECTION ANEURYSM OF LEFT ARM ARTERIOVENOUS FISTULA  ;  Surgeon: Serafina Mitchell, MD;  Location: [place];  Service: Vascular;  Laterality: Left;  ? SIMPLE MASTECTOMY WITH AXILLARY SENTINEL NODE BIOPSY Bilateral 08/22/2021  ? Procedure: BILATERAL SIMPLE MASTECTOMY;  Surgeon: Erroll Luna, MD;  Location: [place];  Service: General;   Laterality: Bilateral;  ? TUBAL LIGATION    ? ?Patient Active Problem List  ? Diagnosis Date Noted  ? Fever 08/24/2021  ? AKI (acute kidney injury/renal transplant on immunosuppresants  08/24/2021  ? BRCA2 gene mutation positive s/p bilateral masectomy  08/22/2021  ? Port-A-Cath in place 07/12/2021  ? Hyponatremia 07/03/2021  ? UTI (urinary tract infection) 07/02/2021  ? Leukocytosis 06/13/2021  ? Complicated UTI (urinary tract infection) 06/13/2021  ? PICC (peripherally inserted central catheter) in place 06/13/2021  ? Stage 3a chronic kidney disease (CKD) ([place]) 05/28/2021  ? Thrombocytopenia ([place]) 05/28/2021  ? Aortic atherosclerosis ([place]) 03/07/2021  ? Hypercholesterolemia 03/07/2021  ? BRCA2 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 03/01/2021  ? Genetic testing 03/01/2021  ? Family history of ovarian cancer 02/22/2021  ? Malignant neoplasm of lower-inner quadrant of left breast in female, estrogen receptor positive ([place]) 02/15/2021  ? Foot pain, bilateral 07/28/2020  ? COVID-19 virus infection 06/16/2019  ? Insulin dependent type 2 diabetes mellitus ([place]) 06/06/2019  ? Hyperglycemia 06/02/2019  ? DKA (diabetic ketoacidoses) 06/02/2019  ? HTN (hypertension) 03/04/2019  ? Abnormal nuclear stress test 03/04/2019  ? Diabetic nephropathy ([place]) 03/03/2019  ? History of renal transplant 11/18/2015  ? Diabetic retinopathy associated with type 2 diabetes mellitus ([place]) 04/13/2015  ? Postprandial vomiting   ? 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  ? Anemia 01/04/2015  ? Pseudoaneurysm of arteriovenous dialysis fistula ([place]) 03/16/2014  ? Hyperkalemia 11/16/2013  ? Chronic constipation 07/28/2013  ? Paresthesias 07/28/2013  ? Diabetes mellitus type I ([place])   ? Kidney transplant status 05/28/2013  ? 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 Precordial chest pain 02/10/2013  ? Awaiting organ transplant 09/19/2012  ? ? ?REFERRING DIAG: neuropathy and breast cancer ? ?THERAPY DIAG:  ?Aftercare following surgery for neoplasm ? ?Muscle weakness  (generalized) ? ?Malignant neoplasm of lower-inner quadrant of left breast in female, estrogen receptor positive ([place]) ? ?Abnormal posture ? ?PERTINENT HISTORY: PERTINENT HISTORY:  ?Left breast cancer s/p left lumpectomy 03/02/21 due to grade 2 IDC with negative LN.  Due to high risk oncotype pt started adjuvant TC after delayed wound healing. bil mastectomy with Dr. Brantley Stage 08/22/21. On gabapentin for diabetic neuropathy and long term steroids for kidney transplant. Other history includes: CAD, type 1 DM, HTN, s/p kidney transplant 2017.  ? ?PRECAUTIONS: PRECAUTIONS: Other: lymphedema risk Lt UE ?  ?WEIGHT BEARING RESTRICTIONS No ? ?SUBJECTIVE:   I am doing good today but I am tired and my back hurts pretty bad from gardening yesterday.  My surgery is now scheduled for 11/29/21. My incisions are both closed. A little better balance ? ?PAIN:  ?Yes: 4-5/10 low back ? ? ?TREATMENT  ?10/24/21 ?Supine: bridgex10 ? Hip abduction red band  2x 10 with TrA cueing but pain with TrA activation today ? LTR x 5 each ? Hooklying ball squeeze  purple x 15 ? SLR x 10 bil ?Hooklying ABDuction red band bil x 15 and then single leg x 15 each ?   ?In // bars:   ? SL stance work on oval disc 3xmax bil ? Bosu lunge x 6 bil and then bosu bil stance on top 3xmax with CGA ?  ? ?Outside walk including flat and slanted, step up curbs x 10 bil while moving Rt and Lt up hill and also downhill, side steps and walking on the grass  ?On rebounder: heel to toe rocking, slow marching x 30", and heel toe standing x20" bil all with SBA only, also slow march in circle Rt and Lt ?  ?Increased pain in low back today ending session slightly early ? ? ?10/17/21 ?LAQ 3#  2x 10 bil ?Seated march 2# 2x 10 bil seated on wobble disc with no hands ? ?Supine: bridgex10 with feet on wobble disc ? Hip abduction red band  2x 10 with TrA cueing ? TrA with march 2x 10 bil ? LTR x 5 each ? Hooklying ball squeeze purple x 15 ? SLR x 10 bil ?Hooklying ABDuction red band bil x 15  and then single leg x 15 each ?   ?In // bars:   ? Standing on black foam with head turns Rt/Lt and EO/EC x 10 sec each x 4   ?Tandem stance x 20" bil x 2 on black foam ? SLS with star taps no bar assistance ? B

## 2021-10-25 ENCOUNTER — Other Ambulatory Visit: Payer: Self-pay

## 2021-10-26 ENCOUNTER — Telehealth: Payer: Self-pay

## 2021-10-26 ENCOUNTER — Ambulatory Visit: Payer: Medicare HMO | Admitting: Rehabilitation

## 2021-10-26 DIAGNOSIS — Z483 Aftercare following surgery for neoplasm: Secondary | ICD-10-CM | POA: Diagnosis not present

## 2021-10-26 DIAGNOSIS — M6281 Muscle weakness (generalized): Secondary | ICD-10-CM

## 2021-10-26 DIAGNOSIS — Z170 Estrogen receptor positive status [ER+]: Secondary | ICD-10-CM

## 2021-10-26 DIAGNOSIS — R293 Abnormal posture: Secondary | ICD-10-CM

## 2021-10-26 NOTE — Telephone Encounter (Signed)
Called pt via [place] translators to advise her appt in Aug she will see Mendel Ryder, NP as opposed to Dr Burr Medico. This is a survivorship visit. She verbalized thanks and understanding.  ?

## 2021-10-26 NOTE — Therapy (Signed)
?OUTPATIENT PHYSICAL THERAPY TREATMENT NOTE ? ? ?Patient Name: Cynthia [name] ?MRN: [id number] ?DOB:1971-02-02, 51 y.o., female ?Today's Date: 10/26/2021 ? ?PCP: Arthur Holms, NP ?REFERRING PROVIDER: Truitt Merle, MD ? ? PT End of Session - 10/26/21 1340   ? ? Visit Number 13   ? Number of Visits 18   ? Date for PT Re-Evaluation 11/24/21   ? Authorization Type 9 visits 10/12/21-11/24/21   ? Authorization - Visit Number 4   ? Authorization - Number of Visits 9   ? PT Start Time 1340   ? PT Stop Time 1409   ? PT Time Calculation (min) 29 min   ? Activity Tolerance Patient tolerated treatment well   ? Behavior During Therapy [facility] for tasks assessed/performed   ? ?  ?  ? ?  ? ? ? ? ? ?Past Medical History:  ?Diagnosis Date  ? Allergy   ? pollen  ? Anemia   ? Anxiety   ? BRCA2 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 03/01/2021  ? Breast cancer ([place])   ? Cataract   ? surgical repair bilateral  ? Coronary artery disease   ? Depression   ? ESRD on hemodialysis ([place])   ? Home HD 5x per week- not on dialysis now had tramsplant 4/17  ? Family history of ovarian cancer 02/22/2021  ? GERD (gastroesophageal reflux disease)   ? Hearing loss 2017  ? right ear  ? History of blood transfusion   ? transfusion reaction  ? Hyperlipidemia   ? Hypertension   ? Insulin-dependent diabetes mellitus with renal complications   ? Type I beginning now type II per pt-dr levy also II; Per patient 08/18/21 she is Type 2  ? Kidney transplant recipient   ? Neuromuscular disorder ([place])   ? NEUROPATHY  ? Sleep apnea   ? ?Past Surgical History:  ?Procedure Laterality Date  ? ABDOMINAL HYSTERECTOMY    ? BREAST EXCISIONAL BIOPSY Right 08/2015  ? BREAST LUMPECTOMY WITH RADIOACTIVE SEED LOCALIZATION Right 09/14/2015  ? Procedure: RIGHT BREAST LUMPECTOMY WITH RADIOACTIVE SEED LOCALIZATION;  Surgeon: Donnie Mesa, MD;  Location: [place];  Service: General;  Laterality: Right;  ? BREAST LUMPECTOMY WITH RADIOACTIVE SEED LOCALIZATION Left 03/02/2021  ?  Procedure: LEFT BREAST SEED LUMPECTOMY LEFT SENTINEL LYMPH NODE MAPPING;  Surgeon: Erroll Luna, MD;  Location: [place];  Service: General;  Laterality: Left;  GEN AND PEC BLOCK  ? BREAST SURGERY Bilateral   ? biopsy bilateral  ? CARDIAC CATHETERIZATION    ? CATARACT EXTRACTION Bilateral   ? bilateral  ? CHOLECYSTECTOMY    ? CYST REMOVAL NECK    ? DIALYSIS FISTULA CREATION Left   ? EYE SURGERY Bilateral   ? lazer  ? kidney transplant    ? LEFT HEART CATH AND CORONARY ANGIOGRAPHY N/A 03/04/2019  ? Procedure: LEFT HEART CATH AND CORONARY ANGIOGRAPHY;  Surgeon: Martinique, Peter M, MD;  Location: [place] CV LAB;  Service: Cardiovascular;  Laterality: N/A;  ? LEFT HEART CATHETERIZATION WITH CORONARY ANGIOGRAM N/A 03/30/2014  ? Procedure: LEFT HEART CATHETERIZATION WITH CORONARY ANGIOGRAM;  Surgeon: Sinclair Grooms, MD;  Location: [facility] CATH LAB;  Service: Cardiovascular;  Laterality: N/A;  ? LIPOMA EXCISION N/A 02/06/2017  ? Procedure: EXCISION POSTERIOR NECK SEBACEOUS CYST;  Surgeon: Coralie Keens, MD;  Location: [place];  Service: General;  Laterality: N/A;  ? 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  ? Procedure: REPAIR OF ARTERIOVENOUS FISTULA ANEURYSM;  Surgeon: Serafina Mitchell, MD;  [facility];  Service: Vascular;  Laterality: Left;  ? REVISON OF ARTERIOVENOUS FISTULA Left 09/28/2013  ? Procedure: EXCISE ESCHAR LEFT ARM  ARTERIOVENOUS FISTULA WITH PLICATION OF LEFT ARM ARTERIOVENOUS FISTULA;  Surgeon: Elam Dutch, MD;  Location: [place];  Service: Vascular;  Laterality: Left;  ? REVISON OF ARTERIOVENOUS FISTULA Left 08/26/2015  ? Procedure: RESECTION ANEURYSM OF LEFT ARM ARTERIOVENOUS FISTULA  ;  Surgeon: Serafina Mitchell, MD;  Location: [place];  Service: Vascular;  Laterality: Left;  ? SIMPLE MASTECTOMY WITH AXILLARY SENTINEL NODE BIOPSY Bilateral 08/22/2021  ? Procedure: BILATERAL SIMPLE MASTECTOMY;  Surgeon: Erroll Luna, MD;  Location: [place];  Service: General;   Laterality: Bilateral;  ? TUBAL LIGATION    ? ?Patient Active Problem List  ? Diagnosis Date Noted  ? Fever 08/24/2021  ? AKI (acute kidney injury/renal transplant on immunosuppresants  08/24/2021  ? BRCA2 gene mutation positive s/p bilateral masectomy  08/22/2021  ? Port-A-Cath in place 07/12/2021  ? Hyponatremia 07/03/2021  ? UTI (urinary tract infection) 07/02/2021  ? Leukocytosis 06/13/2021  ? Complicated UTI (urinary tract infection) 06/13/2021  ? PICC (peripherally inserted central catheter) in place 06/13/2021  ? Stage 3a chronic kidney disease (CKD) ([place]) 05/28/2021  ? Thrombocytopenia ([place]) 05/28/2021  ? Aortic atherosclerosis ([place]) 03/07/2021  ? Hypercholesterolemia 03/07/2021  ? BRCA2 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 03/01/2021  ? Genetic testing 03/01/2021  ? Family history of ovarian cancer 02/22/2021  ? Malignant neoplasm of lower-inner quadrant of left breast in female, estrogen receptor positive ([place]) 02/15/2021  ? Foot pain, bilateral 07/28/2020  ? COVID-19 virus infection 06/16/2019  ? Insulin dependent type 2 diabetes mellitus ([place]) 06/06/2019  ? Hyperglycemia 06/02/2019  ? DKA (diabetic ketoacidoses) 06/02/2019  ? HTN (hypertension) 03/04/2019  ? Abnormal nuclear stress test 03/04/2019  ? Diabetic nephropathy ([place]) 03/03/2019  ? History of renal transplant 11/18/2015  ? Diabetic retinopathy associated with type 2 diabetes mellitus ([place]) 04/13/2015  ? Postprandial vomiting   ? 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  ? Anemia 01/04/2015  ? Pseudoaneurysm of arteriovenous dialysis fistula ([place]) 03/16/2014  ? Hyperkalemia 11/16/2013  ? Chronic constipation 07/28/2013  ? Paresthesias 07/28/2013  ? Diabetes mellitus type I ([place])   ? Kidney transplant status 05/28/2013  ? 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 Precordial chest pain 02/10/2013  ? Awaiting organ transplant 09/19/2012  ? ? ?REFERRING DIAG: neuropathy and breast cancer ? ?THERAPY DIAG:  ?Aftercare following surgery for neoplasm ? ?Muscle weakness  (generalized) ? ?Malignant neoplasm of lower-inner quadrant of left breast in female, estrogen receptor positive ([place]) ? ?Abnormal posture ? ?PERTINENT HISTORY: PERTINENT HISTORY:  ?Left breast cancer s/p left lumpectomy 03/02/21 due to grade 2 IDC with negative LN.  Due to high risk oncotype pt started adjuvant TC after delayed wound healing. bil mastectomy with Dr. Brantley Stage 08/22/21. On gabapentin for diabetic neuropathy and long term steroids for kidney transplant. Other history includes: CAD, type 1 DM, HTN, s/p kidney transplant 2017.  ? ?PRECAUTIONS: PRECAUTIONS: Other: lymphedema risk Lt UE ?  ?WEIGHT BEARING RESTRICTIONS No ? ?SUBJECTIVE:  Pts back is still hurting and would like to limit standing activities today.  ? ?PAIN:  ?Yes: 6/10 low back ? ? ?TREATMENT  ?10/26/21 ?LAQ 3#  2x 10 bil ?Seated march 2# 2x 10 bil seated ?Hamstring curls red band 2x10 ?Sit to stand x 10 no hands  ? ?Supine: bridgex10  ? TrA with march 2x 10 bil ? LTR 2 x 5 each ? Hooklying ball squeeze purple x 15 ?  SLR x 10 bil ?Hooklying ABDuction red band bil x 15 and then single leg x 15 each ?   ? ?10/24/21 ?Supine: bridgex10 ? Hip abduction red band  2x 10 with TrA cueing but pain with TrA activation today ? LTR x 5 each ? Hooklying ball squeeze purple x 15 ? SLR x 10 bil ?Hooklying ABDuction red band bil x 15 and then single leg x 15 each ?   ?In // bars:   ? SL stance work on oval disc 3xmax bil ? Bosu lunge x 6 bil and then bosu bil stance on top 3xmax with CGA ?  ? ?Outside walk including flat and slanted, step up curbs x 10 bil while moving Rt and Lt up hill and also downhill, side steps and walking on the grass  ?On rebounder: heel to toe rocking, slow marching x 30", and heel toe standing x20" bil all with SBA only, also slow march in circle Rt and Lt ?  ?Increased pain in low back today ending session slightly early ? ? ?10/17/21 ?LAQ 3#  2x 10 bil ?Seated march 2# 2x 10 bil seated on wobble disc with no hands ? ?Supine: bridgex10  with feet on wobble disc ? Hip abduction red band  2x 10 with TrA cueing ? TrA with march 2x 10 bil ? LTR x 5 each ? Hooklying ball squeeze purple x 15 ? SLR x 10 bil ?Hooklying ABDuction red band bil x 15 and then si

## 2021-10-31 ENCOUNTER — Ambulatory Visit: Payer: Medicare HMO

## 2021-10-31 ENCOUNTER — Encounter: Payer: Self-pay | Admitting: Licensed Clinical Social Worker

## 2021-10-31 DIAGNOSIS — M6281 Muscle weakness (generalized): Secondary | ICD-10-CM

## 2021-10-31 DIAGNOSIS — Z483 Aftercare following surgery for neoplasm: Secondary | ICD-10-CM

## 2021-10-31 DIAGNOSIS — Z170 Estrogen receptor positive status [ER+]: Secondary | ICD-10-CM

## 2021-10-31 DIAGNOSIS — R293 Abnormal posture: Secondary | ICD-10-CM

## 2021-10-31 NOTE — Therapy (Signed)
?OUTPATIENT PHYSICAL THERAPY TREATMENT NOTE ? ? ?Patient Name: Cynthia [name] ?MRN: [id number] ?DOB:09/03/1970, 51 y.o., female ?Today's Date: 10/31/2021 ? ?PCP: Arthur Holms, NP ?REFERRING PROVIDER: Truitt Merle, MD ? ? PT End of Session - 10/31/21 1413   ? ? Visit Number 14   ? Number of Visits 18   ? Date for PT Re-Evaluation 11/24/21   ? Authorization Type 9 visits 10/12/21-11/24/21   ? Authorization - Visit Number 5   ? Authorization - Number of Visits 9   ? PT Start Time 1414   pt late  ? PT Stop Time 3614   ? PT Time Calculation (min) 38 min   ? Activity Tolerance Patient tolerated treatment well   ? Behavior During Therapy [facility] for tasks assessed/performed   ? ?  ?  ? ?  ? ? ? ? ? ?Past Medical History:  ?Diagnosis Date  ? Allergy   ? pollen  ? Anemia   ? Anxiety   ? BRCA2 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 03/01/2021  ? Breast cancer ([place])   ? Cataract   ? surgical repair bilateral  ? Coronary artery disease   ? Depression   ? ESRD on hemodialysis ([place])   ? Home HD 5x per week- not on dialysis now had tramsplant 4/17  ? Family history of ovarian cancer 02/22/2021  ? GERD (gastroesophageal reflux disease)   ? Hearing loss 2017  ? right ear  ? History of blood transfusion   ? transfusion reaction  ? Hyperlipidemia   ? Hypertension   ? Insulin-dependent diabetes mellitus with renal complications   ? Type I beginning now type II per pt-dr levy also II; Per patient 08/18/21 she is Type 2  ? Kidney transplant recipient   ? Neuromuscular disorder ([place])   ? NEUROPATHY  ? Sleep apnea   ? ?Past Surgical History:  ?Procedure Laterality Date  ? ABDOMINAL HYSTERECTOMY    ? BREAST EXCISIONAL BIOPSY Right 08/2015  ? BREAST LUMPECTOMY WITH RADIOACTIVE SEED LOCALIZATION Right 09/14/2015  ? Procedure: RIGHT BREAST LUMPECTOMY WITH RADIOACTIVE SEED LOCALIZATION;  Surgeon: Donnie Mesa, MD;  Location: [place];  Service: General;  Laterality: Right;  ? BREAST LUMPECTOMY WITH RADIOACTIVE SEED LOCALIZATION Left 03/02/2021  ?  Procedure: LEFT BREAST SEED LUMPECTOMY LEFT SENTINEL LYMPH NODE MAPPING;  Surgeon: Erroll Luna, MD;  Location: [place];  Service: General;  Laterality: Left;  GEN AND PEC BLOCK  ? BREAST SURGERY Bilateral   ? biopsy bilateral  ? CARDIAC CATHETERIZATION    ? CATARACT EXTRACTION Bilateral   ? bilateral  ? CHOLECYSTECTOMY    ? CYST REMOVAL NECK    ? DIALYSIS FISTULA CREATION Left   ? EYE SURGERY Bilateral   ? lazer  ? kidney transplant    ? LEFT HEART CATH AND CORONARY ANGIOGRAPHY N/A 03/04/2019  ? Procedure: LEFT HEART CATH AND CORONARY ANGIOGRAPHY;  Surgeon: Martinique, Peter M, MD;  Location: [place] [facility];  Service: Cardiovascular;  Laterality: N/A;  ? LEFT HEART CATHETERIZATION WITH CORONARY ANGIOGRAM N/A 03/30/2014  ? Procedure: LEFT HEART CATHETERIZATION WITH CORONARY ANGIOGRAM;  Surgeon: Sinclair Grooms, MD;  Location: [facility] CATH LAB;  Service: Cardiovascular;  Laterality: N/A;  ? LIPOMA EXCISION N/A 02/06/2017  ? Procedure: EXCISION POSTERIOR NECK SEBACEOUS CYST;  Surgeon: Coralie Keens, MD;  Location: [place];  Service: General;  Laterality: N/A;  ? 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  ? Procedure: REPAIR OF ARTERIOVENOUS FISTULA ANEURYSM;  Surgeon: Durene Fruits  Pierre Bali, MD;  [facility];  Service: Vascular;  Laterality: Left;  ? REVISON OF ARTERIOVENOUS FISTULA Left 09/28/2013  ? Procedure: EXCISE ESCHAR LEFT ARM  ARTERIOVENOUS FISTULA WITH PLICATION OF LEFT ARM ARTERIOVENOUS FISTULA;  Surgeon: Elam Dutch, MD;  Location: [place];  Service: Vascular;  Laterality: Left;  ? REVISON OF ARTERIOVENOUS FISTULA Left 08/26/2015  ? Procedure: RESECTION ANEURYSM OF LEFT ARM ARTERIOVENOUS FISTULA  ;  Surgeon: Serafina Mitchell, MD;  Location: [place];  Service: Vascular;  Laterality: Left;  ? SIMPLE MASTECTOMY WITH AXILLARY SENTINEL NODE BIOPSY Bilateral 08/22/2021  ? Procedure: BILATERAL SIMPLE MASTECTOMY;  Surgeon: Erroll Luna, MD;  Location: [place];  Service: General;   Laterality: Bilateral;  ? TUBAL LIGATION    ? ?Patient Active Problem List  ? Diagnosis Date Noted  ? Fever 08/24/2021  ? AKI (acute kidney injury/renal transplant on immunosuppresants  08/24/2021  ? BRCA2 gene mutation positive s/p bilateral masectomy  08/22/2021  ? Port-A-Cath in place 07/12/2021  ? Hyponatremia 07/03/2021  ? UTI (urinary tract infection) 07/02/2021  ? Leukocytosis 06/13/2021  ? Complicated UTI (urinary tract infection) 06/13/2021  ? PICC (peripherally inserted central catheter) in place 06/13/2021  ? Stage 3a chronic kidney disease (CKD) ([place]) 05/28/2021  ? Thrombocytopenia ([place]) 05/28/2021  ? Aortic atherosclerosis ([place]) 03/07/2021  ? Hypercholesterolemia 03/07/2021  ? BRCA2 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 03/01/2021  ? Genetic testing 03/01/2021  ? Family history of ovarian cancer 02/22/2021  ? Malignant neoplasm of lower-inner quadrant of left breast in female, estrogen receptor positive ([place]) 02/15/2021  ? Foot pain, bilateral 07/28/2020  ? COVID-19 virus infection 06/16/2019  ? Insulin dependent type 2 diabetes mellitus ([place]) 06/06/2019  ? Hyperglycemia 06/02/2019  ? DKA (diabetic ketoacidoses) 06/02/2019  ? HTN (hypertension) 03/04/2019  ? Abnormal nuclear stress test 03/04/2019  ? Diabetic nephropathy ([place]) 03/03/2019  ? History of renal transplant 11/18/2015  ? Diabetic retinopathy associated with type 2 diabetes mellitus ([place]) 04/13/2015  ? Postprandial vomiting   ? 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  ? Anemia 01/04/2015  ? Pseudoaneurysm of arteriovenous dialysis fistula ([place]) 03/16/2014  ? Hyperkalemia 11/16/2013  ? Chronic constipation 07/28/2013  ? Paresthesias 07/28/2013  ? Diabetes mellitus type I ([place])   ? Kidney transplant status 05/28/2013  ? 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 Precordial chest pain 02/10/2013  ? Awaiting organ transplant 09/19/2012  ? ? ?REFERRING DIAG: neuropathy and breast cancer ? ?THERAPY DIAG:  ?Aftercare following surgery for neoplasm ? ?Muscle weakness  (generalized) ? ?Malignant neoplasm of lower-inner quadrant of left breast in female, estrogen receptor positive ([place]) ? ?Abnormal posture ? ?PERTINENT HISTORY: PERTINENT HISTORY:  ?Left breast cancer s/p left lumpectomy 03/02/21 due to grade 2 IDC with negative LN.  Due to high risk oncotype pt started adjuvant TC after delayed wound healing. bil mastectomy with Dr. Brantley Stage 08/22/21. On gabapentin for diabetic neuropathy and long term steroids for kidney transplant. Other history includes: CAD, type 1 DM, HTN, s/p kidney transplant 2017.  ? ?PRECAUTIONS: PRECAUTIONS: Other: lymphedema risk Lt UE ?  ?WEIGHT BEARING RESTRICTIONS No ? ?SUBJECTIVE:  Pts back is still hurting and I couldn't finish last visit. Wearing back brace today and it helps ? ?PAIN:  ?Yes: 2-3/10 low back ? ? ?TREATMENT  ?10/31/2021 ?LAQ 3#  2x 10 bil ?Seated march 2# 2x 10 bil seated ?Hamstring curls red band 2x10 ?Sit to stand x 10 no hands ? ?Supine: bridge2 x10  ? TrA with march 2x 10 bil ? LTR 2 x 5  each ? Hooklying ball squeeze purple x 15 ? SLR  2x 5 bil ?Hooklying ABDuction red band bil 2 x 15  ?Supine HS stretch 2 x 20 sec with PT performing ?Supine piriformis stretch x 1 ea ?Standing forward step up on airex x 10 Bilaterally, and x 10 sidewaysto left then held secondary to increased LBP ?10/26/21 ?LAQ 3#  2x 10 bil ?Seated march 2# 2x 10 bil seated ?Hamstring curls red band 2x10 ?Sit to stand x 10 no hands  ? ?Supine: bridge x10  ? TrA with march 2x 10 bil ? LTR 2 x 5 each ? Hooklying ball squeeze purple x 15 ? SLR x 10 bil ?Hooklying ABDuction red band bil  x 15 and then single leg x 15 each ?   ? ?10/24/21 ?Supine: bridgex10 ? Hip abduction red band  2x 10 with TrA cueing but pain with TrA activation today ? LTR x 5 each ? Hooklying ball squeeze purple x 15 ? SLR x 10 bil ?Hooklying ABDuction red band bil x 15 and then single leg x 15 each ?   ?In // bars:   ? SL stance work on oval disc 3xmax bil ? Bosu lunge x 6 bil and then bosu bil stance  on top 3xmax with CGA ?  ? ?Outside walk including flat and slanted, step up curbs x 10 bil while moving Rt and Lt up hill and also downhill, side steps and walking on the grass  ?On rebounder: heel to toe

## 2021-11-02 ENCOUNTER — Ambulatory Visit: Payer: Medicare HMO | Admitting: Rehabilitation

## 2021-11-02 ENCOUNTER — Encounter: Payer: Self-pay | Admitting: Rehabilitation

## 2021-11-02 DIAGNOSIS — Z483 Aftercare following surgery for neoplasm: Secondary | ICD-10-CM | POA: Diagnosis not present

## 2021-11-02 DIAGNOSIS — R2689 Other abnormalities of gait and mobility: Secondary | ICD-10-CM

## 2021-11-02 NOTE — Therapy (Signed)
?OUTPATIENT PHYSICAL THERAPY TREATMENT NOTE ? ? ?Patient Name: Cynthia [name] ?MRN: [id number] ?DOB:06/10/1971, 51 y.o., female ?Today's Date: 11/02/2021 ? ?PCP: Arthur Holms, NP ?REFERRING PROVIDER: Truitt Merle, MD ? ? PT End of Session - 11/02/21 1341   ? ? Visit Number 15   ? Number of Visits 18   ? Date for PT Re-Evaluation 11/24/21   ? Authorization - Visit Number 6   ? Authorization - Number of Visits 9   ? PT Start Time 7829   late  ? PT Stop Time 1410   ? PT Time Calculation (min) 27 min   ? Activity Tolerance Patient limited by fatigue;Patient limited by lethargy   ? Behavior During Therapy [facility] for tasks assessed/performed   ? ?  ?  ? ?  ? ? ? ? ? ? ?Past Medical History:  ?Diagnosis Date  ? Allergy   ? pollen  ? Anemia   ? Anxiety   ? BRCA2 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 03/01/2021  ? Breast cancer ([place])   ? Cataract   ? surgical repair bilateral  ? Coronary artery disease   ? Depression   ? ESRD on hemodialysis ([place])   ? Home HD 5x per week- not on dialysis now had tramsplant 4/17  ? Family history of ovarian cancer 02/22/2021  ? GERD (gastroesophageal reflux disease)   ? Hearing loss 2017  ? right ear  ? History of blood transfusion   ? transfusion reaction  ? Hyperlipidemia   ? Hypertension   ? Insulin-dependent diabetes mellitus with renal complications   ? Type I beginning now type II per pt-dr levy also II; Per patient 08/18/21 she is Type 2  ? Kidney transplant recipient   ? Neuromuscular disorder ([place])   ? NEUROPATHY  ? Sleep apnea   ? ?Past Surgical History:  ?Procedure Laterality Date  ? ABDOMINAL HYSTERECTOMY    ? BREAST EXCISIONAL BIOPSY Right 08/2015  ? BREAST LUMPECTOMY WITH RADIOACTIVE SEED LOCALIZATION Right 09/14/2015  ? Procedure: RIGHT BREAST LUMPECTOMY WITH RADIOACTIVE SEED LOCALIZATION;  Surgeon: Donnie Mesa, MD;  Location: [place];  Service: General;  Laterality: Right;  ? BREAST LUMPECTOMY WITH RADIOACTIVE SEED LOCALIZATION Left 03/02/2021  ? Procedure: LEFT BREAST  SEED LUMPECTOMY LEFT SENTINEL LYMPH NODE MAPPING;  Surgeon: Erroll Luna, MD;  Location: [place];  Service: General;  Laterality: Left;  GEN AND PEC BLOCK  ? BREAST SURGERY Bilateral   ? biopsy bilateral  ? CARDIAC CATHETERIZATION    ? CATARACT EXTRACTION Bilateral   ? bilateral  ? CHOLECYSTECTOMY    ? CYST REMOVAL NECK    ? DIALYSIS FISTULA CREATION Left   ? EYE SURGERY Bilateral   ? lazer  ? kidney transplant    ? LEFT HEART CATH AND CORONARY ANGIOGRAPHY N/A 03/04/2019  ? Procedure: LEFT HEART CATH AND CORONARY ANGIOGRAPHY;  Surgeon: Martinique, Peter M, MD;  Location: [place] [facility];  Service: Cardiovascular;  Laterality: N/A;  ? LEFT HEART CATHETERIZATION WITH CORONARY ANGIOGRAM N/A 03/30/2014  ? Procedure: LEFT HEART CATHETERIZATION WITH CORONARY ANGIOGRAM;  Surgeon: Sinclair Grooms, MD;  Location: [facility] CATH LAB;  Service: Cardiovascular;  Laterality: N/A;  ? LIPOMA EXCISION N/A 02/06/2017  ? Procedure: EXCISION POSTERIOR NECK SEBACEOUS CYST;  Surgeon: Coralie Keens, MD;  Location: [place];  Service: General;  Laterality: N/A;  ? 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  ? Procedure: REPAIR OF ARTERIOVENOUS FISTULA ANEURYSM;  Surgeon: Serafina Mitchell, MD;  [facility];  Service: Vascular;  Laterality: Left;  ? REVISON OF ARTERIOVENOUS FISTULA Left 09/28/2013  ? Procedure: EXCISE ESCHAR LEFT ARM  ARTERIOVENOUS FISTULA WITH PLICATION OF LEFT ARM ARTERIOVENOUS FISTULA;  Surgeon: Elam Dutch, MD;  Location: [place];  Service: Vascular;  Laterality: Left;  ? REVISON OF ARTERIOVENOUS FISTULA Left 08/26/2015  ? Procedure: RESECTION ANEURYSM OF LEFT ARM ARTERIOVENOUS FISTULA  ;  Surgeon: Serafina Mitchell, MD;  Location: [place];  Service: Vascular;  Laterality: Left;  ? SIMPLE MASTECTOMY WITH AXILLARY SENTINEL NODE BIOPSY Bilateral 08/22/2021  ? Procedure: BILATERAL SIMPLE MASTECTOMY;  Surgeon: Erroll Luna, MD;  Location: [place];  Service: General;  Laterality: Bilateral;   ? TUBAL LIGATION    ? ?Patient Active Problem List  ? Diagnosis Date Noted  ? Fever 08/24/2021  ? AKI (acute kidney injury/renal transplant on immunosuppresants  08/24/2021  ? BRCA2 gene mutation positive s/p bilateral masectomy  08/22/2021  ? Port-A-Cath in place 07/12/2021  ? Hyponatremia 07/03/2021  ? UTI (urinary tract infection) 07/02/2021  ? Leukocytosis 06/13/2021  ? Complicated UTI (urinary tract infection) 06/13/2021  ? PICC (peripherally inserted central catheter) in place 06/13/2021  ? Stage 3a chronic kidney disease (CKD) ([place]) 05/28/2021  ? Thrombocytopenia ([place]) 05/28/2021  ? Aortic atherosclerosis ([place]) 03/07/2021  ? Hypercholesterolemia 03/07/2021  ? BRCA2 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 03/01/2021  ? Genetic testing 03/01/2021  ? Family history of ovarian cancer 02/22/2021  ? Malignant neoplasm of lower-inner quadrant of left breast in female, estrogen receptor positive ([place]) 02/15/2021  ? Foot pain, bilateral 07/28/2020  ? COVID-19 virus infection 06/16/2019  ? Insulin dependent type 2 diabetes mellitus ([place]) 06/06/2019  ? Hyperglycemia 06/02/2019  ? DKA (diabetic ketoacidoses) 06/02/2019  ? HTN (hypertension) 03/04/2019  ? Abnormal nuclear stress test 03/04/2019  ? Diabetic nephropathy ([place]) 03/03/2019  ? History of renal transplant 11/18/2015  ? Diabetic retinopathy associated with type 2 diabetes mellitus ([place]) 04/13/2015  ? Postprandial vomiting   ? 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  ? Anemia 01/04/2015  ? Pseudoaneurysm of arteriovenous dialysis fistula ([place]) 03/16/2014  ? Hyperkalemia 11/16/2013  ? Chronic constipation 07/28/2013  ? Paresthesias 07/28/2013  ? Diabetes mellitus type I ([place])   ? Kidney transplant status 05/28/2013  ? 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 Precordial chest pain 02/10/2013  ? Awaiting organ transplant 09/19/2012  ? ? ?REFERRING DIAG: neuropathy and breast cancer ? ?THERAPY DIAG:  ?Other abnormalities of gait and mobility ? ?PERTINENT HISTORY: PERTINENT HISTORY:  ?Left  breast cancer s/p left lumpectomy 03/02/21 due to grade 2 IDC with negative LN.  Due to high risk oncotype pt started adjuvant TC after delayed wound healing. bil mastectomy with Dr. Brantley Stage 08/22/21. On gabapentin for diabetic neuropathy and long term steroids for kidney transplant. Other history includes: CAD, type 1 DM, HTN, s/p kidney transplant 2017.  ? ?PRECAUTIONS: PRECAUTIONS: Other: lymphedema risk Lt UE ?  ?WEIGHT BEARING RESTRICTIONS No ? ?SUBJECTIVE:  My back does not hurt today  (Interpretor present for all PT sessions) ? ?PAIN:  ?NO:  0/10 low back ? ? ?TREATMENT  ?11/02/21 ?LAQ 4#  2x 10 bil ? ?Supine: bridge2 x10 on wobble disc ? TrA with march 2x 10 bil ? LTR 2 x 5 each ? Hooklying ball squeeze purple 2x10 ? SLR  2x 5 bil ?Hooklying ABDuction red band bil 2 x 15  ?Supine HS stretch 2 x 20 sec with PT performing ?Supine piriformis stretch x 1 with PT performing ? ?in // bars:   ?Stance  on foam with EC 3x20" with pt reporting she felt like she needed to sit down ?Discussed with pt why she may be feeling bad today and asked about her blood sugar levels.  Pt reports her A1C was elevated but she does not know her glucose as she is not wearing her monitor but that she did not take her insulin today as she was in a hurry.  Recommended that we stop TE at this time so pt can get home to check her glucose and take her insulin.  Pt in agreement.  Pt feels okay to drive and declines needing to call for a ride or rest before leaving.   ? ?10/31/2021 ?LAQ 3#  2x 10 bil ?Seated march 2# 2x 10 bil seated ?Hamstring curls red band 2x10 ?Sit to stand x 10 no hands ? ?Supine: bridge2 x10  ? TrA with march 2x 10 bil ? LTR 2 x 5 each ? Hooklying ball squeeze purple x 15 ? SLR  2x 5 bil ?Hooklying ABDuction red band bil 2 x 15  ?Supine HS stretch 2 x 20 sec with PT performing ?Supine piriformis stretch x 1 ea ?Standing forward step up on airex x 10 Bilaterally, and x 10 sidewaysto left then held secondary to increased  LBP ?10/26/21 ?LAQ 3#  2x 10 bil ?Seated march 2# 2x 10 bil seated ?Hamstring curls red band 2x10 ?Sit to stand x 10 no hands  ? ?Supine: bridge x10  ? TrA with march 2x 10 bil ? LTR 2 x 5 each ? Hooklying ball

## 2021-11-07 ENCOUNTER — Encounter: Payer: Medicare HMO | Admitting: Rehabilitation

## 2021-11-09 ENCOUNTER — Ambulatory Visit: Payer: Medicare HMO | Admitting: Rehabilitation

## 2021-11-09 ENCOUNTER — Telehealth: Payer: Self-pay | Admitting: *Deleted

## 2021-11-09 NOTE — Telephone Encounter (Signed)
Called and spoke with the patient to reschedule her phone visit from 5/23 to 5/25

## 2021-11-14 ENCOUNTER — Ambulatory Visit: Payer: Medicare HMO | Admitting: Gynecologic Oncology

## 2021-11-14 NOTE — Progress Notes (Signed)
COVID Vaccine Completed:  Date of COVID positive in last 90 days:  PCP - Arthur Holms, NP Cardiologist - Buford Dresser, MD  Cardiac clearance by Buford Dresser 08/22/21  Chest x-ray - 08/25/21 Epic EKG - 08/08/21 Epic Stress Test - 11/24/20 Epic ECHO - 11/28/20 Epic Cardiac Cath - 03/04/19 Epic Pacemaker/ICD device last checked: Spinal Cord Stimulator:  Bowel Prep -   Sleep Study -  CPAP -   Fasting Blood Sugar -  Checks Blood Sugar _____ times a day  Blood Thinner Instructions: Aspirin Instructions: ASA 81 Last Dose:  Activity level:  Can go up a flight of stairs and perform activities of daily living without stopping and without symptoms of chest pain or shortness of breath.   Able to exercise without symptoms  Unable to go up a flight of stairs without symptoms of      Anesthesia review: HTN, kidney transplant, ESRD, DM2, DKA, thrombocytopenia, OSA, anemia, chemotherapy, mitral regurgitation, bilateral mastectomy     Patient denies shortness of breath, fever, cough and chest pain at PAT appointment   Patient verbalized understanding of instructions that were given to them at the PAT appointment. Patient was also instructed that they will need to review over the PAT instructions again at home before surgery.

## 2021-11-14 NOTE — Patient Instructions (Signed)
DUE TO COVID-19 ONLY TWO VISITORS  (aged 53 and older)  ARE ALLOWED TO COME WITH YOU AND STAY IN THE WAITING ROOM ONLY DURING PRE OP AND PROCEDURE.   **NO VISITORS ARE ALLOWED IN THE SHORT STAY AREA OR RECOVERY ROOM!!**   Your procedure is scheduled on: 11/29/21    Report to [facility] Main Entrance    Report to admitting at 6:15 AM   Call this number if you have problems the morning of surgery [phone]   Do not eat food :After Midnight.   After Midnight you may have the following liquids until 5:30 AM DAY OF SURGERY  Water Black Coffee (sugar ok, NO MILK/CREAM OR CREAMERS)  Tea (sugar ok, NO MILK/CREAM OR CREAMERS) regular and decaf                             Plain Jell-O (NO RED)                                           Fruit ices (not with fruit pulp, NO RED)                                     Popsicles (NO RED)                                                                  Juice: apple, WHITE grape, WHITE cranberry Sports drinks like Gatorade (NO RED) Clear broth(vegetable,chicken,beef)   FOLLOW BOWEL PREP AND ANY ADDITIONAL PRE OP INSTRUCTIONS YOU RECEIVED FROM YOUR SURGEON'S OFFICE!!!     Oral Hygiene is also important to reduce your risk of infection.                                    Remember - BRUSH YOUR TEETH THE MORNING OF SURGERY WITH YOUR REGULAR TOOTHPASTE   Take these medicines the morning of surgery with A SIP OF WATER: Tylenol, Carvedilol, Zyrtec, Lexapro, Gabapentin, Myfortic, Zofran, Oxycodone, Pantoprazole, Prednisone, Prograf.   DO NOT TAKE ANY ORAL DIABETIC MEDICATIONS DAY OF YOUR SURGERY  How to Manage Your Diabetes Before and After Surgery  Why is it important to control my blood sugar before and after surgery? Improving blood sugar levels before and after surgery helps healing and can limit problems. A way of improving blood sugar control is eating a healthy diet by:  Eating less sugar and carbohydrates  Increasing  activity/exercise  Talking with your doctor about reaching your blood sugar goals High blood sugars (greater than 180 mg/dL) can raise your risk of infections and slow your recovery, so you will need to focus on controlling your diabetes during the weeks before surgery. Make sure that the doctor who takes care of your diabetes knows about your planned surgery including the date and location.  How do I manage my blood sugar before surgery? Check your blood sugar at least 4 times a day, starting 2 days before surgery, to make  sure that the level is not too high or low. Check your blood sugar the morning of your surgery when you wake up and every 2 hours until you get to the Short Stay unit. If your blood sugar is less than 70 mg/dL, you will need to treat for low blood sugar: Do not take insulin. Treat a low blood sugar (less than 70 mg/dL) with  cup of clear juice (cranberry or apple), 4 glucose tablets, OR glucose gel. Recheck blood sugar in 15 minutes after treatment (to make sure it is greater than 70 mg/dL). If your blood sugar is not greater than 70 mg/dL on recheck, call [phone] for further instructions. Report your blood sugar to the short stay nurse when you get to Short Stay.  If you are admitted to the hospital after surgery: Your blood sugar will be checked by the staff and you will probably be given insulin after surgery (instead of oral diabetes medicines) to make sure you have good blood sugar levels. The goal for blood sugar control after surgery is 80-180 mg/dL.   WHAT DO I DO ABOUT MY DIABETES MEDICATION?  Do not take oral diabetes medicines (pills) the morning of surgery.  THE DAY BEFORE SURGERY, take Novolog before meals as prescribed. Take morning dose of Lantus as normal, take 50% of evening dose.     THE MORNING OF SURGERY, take  50% of Lantus. Do not take Novolog unless blood sugar is greater than 220, then take 50% of normal dose.  The day of surgery, do not  take other diabetes injectables, including Byetta (exenatide), Bydureon (exenatide ER), Victoza (liraglutide), or Trulicity (dulaglutide).  If your CBG is greater than 220 mg/dL, you may take  of your sliding scale  (correction) dose of insulin.    Reviewed and Endorsed by [facility] Patient Education Committee, August 2015   Bring CPAP mask and tubing day of surgery.                              You may not have any metal on your body including hair pins, jewelry, and body piercing             Do not wear make-up, lotions, powders, perfumes, or deodorant  Do not wear nail polish including gel and S&S, artificial/acrylic nails, or any other type of covering on natural nails including finger and toenails. If you have artificial nails, gel coating, etc. that needs to be removed by a nail salon please have this removed prior to surgery or surgery may need to be canceled/ delayed if the surgeon/ anesthesia feels like they are unable to be safely monitored.   Do not shave  48 hours prior to surgery.    Do not bring valuables to the hospital. [place].    Patients discharged on the day of surgery will not be allowed to drive home.  Someone NEEDS to stay with you for the first 24 hours after anesthesia.              Please read over the following fact sheets you were given: IF YOU HAVE QUESTIONS ABOUT YOUR PRE-OP INSTRUCTIONS PLEASE CALL [name] - Preparing for Surgery Before surgery, you can play an important role.  Because skin is not sterile, your skin needs to  be as free of germs as possible.  You can reduce the number of germs on your skin by washing with CHG (chlorahexidine gluconate) soap before surgery.  CHG is an antiseptic cleaner which kills germs and bonds with the skin to continue killing germs even after washing. Please DO NOT use if you have an allergy to CHG or antibacterial soaps.  If your skin  becomes reddened/irritated stop using the CHG and inform your nurse when you arrive at Short Stay. Do not shave (including legs and underarms) for at least 48 hours prior to the first CHG shower.  You may shave your face/neck.  Please follow these instructions carefully:  1.  Shower with CHG Soap the night before surgery and the  morning of surgery.  2.  If you choose to wash your hair, wash your hair first as usual with your normal  shampoo.  3.  After you shampoo, rinse your hair and body thoroughly to remove the shampoo.                             4.  Use CHG as you would any other liquid soap.  You can apply chg directly to the skin and wash.  Gently with a scrungie or clean washcloth.  5.  Apply the CHG Soap to your body ONLY FROM THE NECK DOWN.   Do   not use on face/ open                           Wound or open sores. Avoid contact with eyes, ears mouth and   genitals (private parts).                       Wash face,  Genitals (private parts) with your normal soap.             6.  Wash thoroughly, paying special attention to the area where your    surgery  will be performed.  7.  Thoroughly rinse your body with warm water from the neck down.  8.  DO NOT shower/wash with your normal soap after using and rinsing off the CHG Soap.                9.  Pat yourself dry with a clean towel.            10.  Wear clean pajamas.            11.  Place clean sheets on your bed the night of your first shower and do not  sleep with pets. Day of Surgery : Do not apply any lotions/deodorants the morning of surgery.  Please wear clean clothes to the hospital/surgery center.  FAILURE TO FOLLOW THESE INSTRUCTIONS MAY RESULT IN THE CANCELLATION OF YOUR SURGERY  PATIENT SIGNATURE_________________________________  NURSE SIGNATURE__________________________________  ________________________________________________________________________

## 2021-11-15 ENCOUNTER — Encounter (HOSPITAL_COMMUNITY)
Admission: RE | Admit: 2021-11-15 | Discharge: 2021-11-15 | Disposition: A | Discharge status: Home / Self Care | Payer: Medicare HMO | Source: Ambulatory Visit | Attending: Gynecologic Oncology | Admitting: Gynecologic Oncology

## 2021-11-15 ENCOUNTER — Encounter (HOSPITAL_COMMUNITY): Payer: Self-pay

## 2021-11-15 VITALS — BP 133/65 | HR 78 | Temp 98.0°F | Resp 14 | Ht 61.0 in | Wt 187.2 lb

## 2021-11-15 DIAGNOSIS — Z794 Long term (current) use of insulin: Secondary | ICD-10-CM | POA: Diagnosis not present

## 2021-11-15 DIAGNOSIS — Z01812 Encounter for preprocedural laboratory examination: Secondary | ICD-10-CM | POA: Insufficient documentation

## 2021-11-15 DIAGNOSIS — C50312 Malignant neoplasm of lower-inner quadrant of left female breast: Secondary | ICD-10-CM | POA: Insufficient documentation

## 2021-11-15 DIAGNOSIS — E119 Type 2 diabetes mellitus without complications: Secondary | ICD-10-CM | POA: Insufficient documentation

## 2021-11-15 DIAGNOSIS — Z1509 Genetic susceptibility to other malignant neoplasm: Secondary | ICD-10-CM | POA: Diagnosis not present

## 2021-11-15 DIAGNOSIS — Z170 Estrogen receptor positive status [ER+]: Secondary | ICD-10-CM | POA: Diagnosis not present

## 2021-11-15 DIAGNOSIS — U071 COVID-19: Secondary | ICD-10-CM

## 2021-11-15 DIAGNOSIS — Z1501 Genetic susceptibility to malignant neoplasm of breast: Secondary | ICD-10-CM | POA: Insufficient documentation

## 2021-11-15 HISTORY — DX: Other complications of anesthesia, initial encounter: T88.59XA

## 2021-11-15 LAB — COMPREHENSIVE METABOLIC PANEL
ALT: 20 U/L (ref 0–44)
AST: 18 U/L (ref 15–41)
Albumin: 4.5 g/dL (ref 3.5–5.0)
Alkaline Phosphatase: 63 U/L (ref 38–126)
Anion gap: 8 (ref 5–15)
BUN: 54 mg/dL — ABNORMAL HIGH (ref 6–20)
CO2: 26 mmol/L (ref 22–32)
Calcium: 10.4 mg/dL — ABNORMAL HIGH (ref 8.9–10.3)
Chloride: 105 mmol/L (ref 98–111)
Creatinine, Ser: 2.07 mg/dL — ABNORMAL HIGH (ref 0.44–1.00)
GFR, Estimated: 28 mL/min — ABNORMAL LOW (ref >60)
Glucose, Bld: 136 mg/dL — ABNORMAL HIGH (ref 70–99)
Potassium: 4.3 mmol/L (ref 3.5–5.1)
Sodium: 139 mmol/L (ref 135–145)
Total Bilirubin: 0.5 mg/dL (ref 0.3–1.2)
Total Protein: 7.1 g/dL (ref 6.5–8.1)

## 2021-11-15 LAB — CBC
HCT: 38.3 % (ref 36.0–46.0)
Hemoglobin: 12.3 g/dL (ref 12.0–15.0)
MCH: 29.3 pg (ref 26.0–34.0)
MCHC: 32.1 g/dL (ref 30.0–36.0)
MCV: 91.2 fL (ref 80.0–100.0)
Platelets: 166 10*3/uL (ref 150–400)
RBC: 4.2 MIL/uL (ref 3.87–5.11)
RDW: 12.6 % (ref 11.5–15.5)
WBC: 5.7 10*3/uL (ref 4.0–10.5)
nRBC: 0 % (ref 0.0–0.2)

## 2021-11-15 LAB — HEMOGLOBIN A1C
Hgb A1c MFr Bld: 9.1 % — ABNORMAL HIGH (ref 4.8–5.6)
Mean Plasma Glucose: 214.47 mg/dL

## 2021-11-15 LAB — GLUCOSE, CAPILLARY: Glucose-Capillary: 146 mg/dL — ABNORMAL HIGH (ref 70–99)

## 2021-11-15 NOTE — Progress Notes (Signed)
Creatinine 2.07, results routed to Dr. Berline Lopes

## 2021-11-16 ENCOUNTER — Inpatient Hospital Stay: Payer: Medicare HMO | Attending: Hematology | Admitting: Gynecologic Oncology

## 2021-11-16 ENCOUNTER — Telehealth: Payer: Self-pay

## 2021-11-16 DIAGNOSIS — Z1501 Genetic susceptibility to malignant neoplasm of breast: Secondary | ICD-10-CM

## 2021-11-16 DIAGNOSIS — Z1509 Genetic susceptibility to other malignant neoplasm: Secondary | ICD-10-CM

## 2021-11-16 DIAGNOSIS — Z170 Estrogen receptor positive status [ER+]: Secondary | ICD-10-CM

## 2021-11-16 DIAGNOSIS — C50312 Malignant neoplasm of lower-inner quadrant of left female breast: Secondary | ICD-10-CM

## 2021-11-16 DIAGNOSIS — E1022 Type 1 diabetes mellitus with diabetic chronic kidney disease: Secondary | ICD-10-CM

## 2021-11-16 DIAGNOSIS — Z1502 Genetic susceptibility to malignant neoplasm of ovary: Secondary | ICD-10-CM

## 2021-11-16 DIAGNOSIS — N185 Chronic kidney disease, stage 5: Secondary | ICD-10-CM

## 2021-11-16 NOTE — Telephone Encounter (Signed)
Attempted to contact patient regarding her blood sugar log. Unable to contact patient. Voicemail not set up on patients phone number [phone]. Attempted husbands phone number [phone], unable to reach patient or husband. Left voicemail (with assistance of interpreter Jordie [phone]) on husbands phone number requesting a return call.

## 2021-11-17 ENCOUNTER — Encounter: Payer: Self-pay | Admitting: Hematology

## 2021-11-17 ENCOUNTER — Encounter: Payer: Medicare HMO | Admitting: Gynecologic Oncology

## 2021-11-17 NOTE — Progress Notes (Signed)
GYN Oncology Pre-op Evaluation  I connected with Cynthia [name] on 11/16/2021 at 11:15 with the assist of [employer] by telephone visit.  Other persons participating in the visit and their role in the encounter: [place] interpreter   Patient's location: Home  Provider's location: Office   Chief Complaint: Pre-operative discussion about blood glucose logs   The patient states she has been doing well. In regards to her blood glucose levels, the values go to her phone and she states she does not write the numbers down. She is unable to give a range. Discussed recent pre-op labs including hemoglobin A1C of 9.1. She is asking if her surgery will be postponed based on these values. Advised patient since this procedure is considered elective and not emergent, Dr. Berline Lopes is wanting her hemoglobin A1C to be less than 8 due to the risk of infection, post-op complications. All questions answered. Advised patient that her lab work/current situation with A1C at 9.1 would be discussed with Dr. Berline Lopes and she would be contacted with her recommendations moving forward but most likely her surgery would need to be postponed until she achieves better glucose control. No needs voiced at the end of the call.

## 2021-11-17 NOTE — Telephone Encounter (Signed)
Spoke with pt's daughter Antony Madura to ask for her assistant in helping her mother get the blood sugar log from her phone so we can review it with her. Antony Madura stated she will help her mom retreive it. Also informed her she can take a picture of it and send it to [place] on her mychart so we can see it and review it with her over the phone as well. She stated she can do that.  She'll call us once she gets the blood sugar logs.

## 2021-11-17 NOTE — Telephone Encounter (Signed)
Attempted to speak with pt's daughter to see if she had gotten a chance to help her mother get her blood sugar logs yet so we can review them to see if we can proceed with the surgery on the 31st or not. Unable to reach her. LVM for return call.

## 2021-11-21 NOTE — Telephone Encounter (Signed)
Following up with patients daughter Antony Madura regarding patients blood sugar log. She is going to connect with her mom today and send blood sugar log via mychart. Instructed that if blood sugars have been below 180 for the past month we will proceed with surgery on 11/29/21.  If not  she will need to continue working with her doctor to improve blood sugar levels and surgery will be delayed. She verbalized understanding and will have the blood sugar log to [place] by the end of the day. Instructed to call with any needs.

## 2021-11-22 ENCOUNTER — Encounter: Payer: Self-pay | Admitting: Hematology

## 2021-11-22 ENCOUNTER — Encounter (HOSPITAL_COMMUNITY): Payer: Self-pay | Admitting: Physician Assistant

## 2021-11-22 NOTE — Telephone Encounter (Signed)
Spoke with Ms. Cynthia [name] this morning with the assistance of [place] interpreter Cynthia [name] [phone]) regarding her blood sugar log. Patient is unable to provide a blood sugar log. She reports she does not consistently check her blood sugar. Sometimes she checks it in the morning, sometimes at night and sometimes twice a day. She reports her lowest BG was 55 and her highest BG 220-230. She has been working with Cynthia [name] to control her BG. Her next follow up for this is in 3 weeks. Cynthia John, NP notified. Advised patient to consistently check her BG and keep a log of it. Continue working with Cynthia Holms, NP to control BG and follow up as scheduled. Surgery will be delayed until BG is improved. When doing surgery with elevated BG levels, you run the risk of infection and other complications. Our office will be in touch regarding surgery scheduling. Patient verbalized understanding and has office number and will call with any questions or concerns.

## 2021-11-29 ENCOUNTER — Encounter (HOSPITAL_COMMUNITY): Admission: RE | Payer: Self-pay | Source: Ambulatory Visit

## 2021-11-29 ENCOUNTER — Ambulatory Visit (HOSPITAL_COMMUNITY): Admission: RE | Admit: 2021-11-29 | Payer: Medicare HMO | Source: Ambulatory Visit | Admitting: Gynecologic Oncology

## 2021-11-29 DIAGNOSIS — Z1501 Genetic susceptibility to malignant neoplasm of breast: Secondary | ICD-10-CM

## 2021-11-29 DIAGNOSIS — Z170 Estrogen receptor positive status [ER+]: Secondary | ICD-10-CM

## 2021-11-29 SURGERY — SALPINGO-OOPHORECTOMY, BILATERAL, ROBOT-ASSISTED
Anesthesia: General

## 2021-12-15 ENCOUNTER — Telehealth: Payer: Self-pay | Admitting: *Deleted

## 2021-12-15 NOTE — Telephone Encounter (Signed)
Attempted to speak with pt today with the assistant of interpreter Milagros [phone]). Unable to reach pt. LVM for return call.

## 2021-12-25 ENCOUNTER — Encounter (INDEPENDENT_AMBULATORY_CARE_PROVIDER_SITE_OTHER): Payer: Medicare HMO | Admitting: Ophthalmology

## 2021-12-25 DIAGNOSIS — Z961 Presence of intraocular lens: Secondary | ICD-10-CM

## 2021-12-25 DIAGNOSIS — E113553 Type 2 diabetes mellitus with stable proliferative diabetic retinopathy, bilateral: Secondary | ICD-10-CM

## 2021-12-25 DIAGNOSIS — H35033 Hypertensive retinopathy, bilateral: Secondary | ICD-10-CM

## 2021-12-25 DIAGNOSIS — I10 Essential (primary) hypertension: Secondary | ICD-10-CM

## 2021-12-27 ENCOUNTER — Telehealth: Payer: Self-pay | Admitting: Adult Health

## 2021-12-27 NOTE — Telephone Encounter (Signed)
Rescheduled appointment per provider time off. The patient was notified using the language solutions line [phone] provided to [employer] employees. A voicemail was left for the patient.

## 2022-01-08 ENCOUNTER — Telehealth: Payer: Self-pay | Admitting: *Deleted

## 2022-01-08 NOTE — Telephone Encounter (Signed)
Spoke with pt's PCP office to request clinical notes. They stated that pt reschedule her appt to August 2nd, 2023. Will follow up then.

## 2022-01-15 ENCOUNTER — Other Ambulatory Visit: Payer: Self-pay

## 2022-01-16 ENCOUNTER — Encounter (INDEPENDENT_AMBULATORY_CARE_PROVIDER_SITE_OTHER): Payer: Medicare HMO | Admitting: Ophthalmology

## 2022-01-16 DIAGNOSIS — H35033 Hypertensive retinopathy, bilateral: Secondary | ICD-10-CM

## 2022-01-16 DIAGNOSIS — E113553 Type 2 diabetes mellitus with stable proliferative diabetic retinopathy, bilateral: Secondary | ICD-10-CM

## 2022-01-16 DIAGNOSIS — Z961 Presence of intraocular lens: Secondary | ICD-10-CM

## 2022-01-16 DIAGNOSIS — I10 Essential (primary) hypertension: Secondary | ICD-10-CM

## 2022-01-23 ENCOUNTER — Ambulatory Visit (INDEPENDENT_AMBULATORY_CARE_PROVIDER_SITE_OTHER): Payer: Medicare HMO | Admitting: Ophthalmology

## 2022-01-23 ENCOUNTER — Encounter (INDEPENDENT_AMBULATORY_CARE_PROVIDER_SITE_OTHER): Payer: Self-pay | Admitting: Ophthalmology

## 2022-01-23 ENCOUNTER — Other Ambulatory Visit: Payer: Self-pay

## 2022-01-23 DIAGNOSIS — I10 Essential (primary) hypertension: Secondary | ICD-10-CM | POA: Diagnosis not present

## 2022-01-23 DIAGNOSIS — H35033 Hypertensive retinopathy, bilateral: Secondary | ICD-10-CM

## 2022-01-23 DIAGNOSIS — E113553 Type 2 diabetes mellitus with stable proliferative diabetic retinopathy, bilateral: Secondary | ICD-10-CM | POA: Diagnosis not present

## 2022-01-23 DIAGNOSIS — Z961 Presence of intraocular lens: Secondary | ICD-10-CM

## 2022-01-23 NOTE — Progress Notes (Signed)
[facility] Note  01/23/2022     CHIEF COMPLAINT Patient presents for Retina Follow Up   HISTORY OF PRESENT ILLNESS: Cynthia [name] is a 51 y.o. female who presents to the clinic today for:   HPI     Retina Follow Up   Patient presents with  Diabetic Retinopathy.  In both eyes.  This started 4 months ago.  I, the attending physician,  performed the HPI with the patient and updated documentation appropriately.        Comments   Patient here for (4 months) retina follow up for PDR OU. Patient states vision no good. Sometimes sees blurry. Sometimes has stinging. Stabbing pain doesn't last long. Feels like when a vein pops tries to hold it. Had a bilateral mastectomy earlier this year. Has had chemo no radiation.       Last edited by Bernarda Caffey, MD on 01/23/2022  9:12 PM.    Pt feels like she has more floaters than she did at last visit  Referring physician: Arthur Holms, [address],  [place] [address]  HISTORICAL INFORMATION:   Selected notes from the [facility] Referred by Dr. Wyatt Portela for diabetic retinal eval LEE: 09/07/2021 BCVA OD: 20/30 OS: 20/25 Ocular Hx- PDR, pseudophakia, dry eye PMH- DM, HTN, depression, kidney transplant; last a1c was 7.3 on 08/18/21    CURRENT MEDICATIONS: No current outpatient medications on file. (Ophthalmic Drugs)   No current facility-administered medications for this visit. (Ophthalmic Drugs)   Current Outpatient Medications (Other)  Medication Sig   Accu-Chek Softclix Lancets lancets Use as instructed (Patient taking differently: 1 each by Other route See admin instructions. Use as instructed)   acetaminophen (TYLENOL) 500 MG tablet Take 500 mg by mouth every 6 (six) hours as needed for mild pain, fever or headache.   aspirin EC 81 MG EC tablet Take 1 tablet (81 mg total) by mouth daily.   Blood Glucose Monitoring Suppl (ACCU-CHEK AVIVA CONNECT) w/Device KIT Check sugar 1x daily  (Patient taking differently: 1 each by Other route See admin instructions. Check sugar 1x daily)   carvedilol (COREG) 25 MG tablet Take 25 mg by mouth 2 (two) times daily with a meal.   cetirizine (ZYRTEC) 10 MG tablet Take 10 mg by mouth daily.   cholecalciferol (VITAMIN D3) 25 MCG (1000 UNIT) tablet Take 1,000 Units by mouth daily.   cyanocobalamin 1000 MCG tablet Take 1,000 mcg by mouth daily.   escitalopram (LEXAPRO) 10 MG tablet Take 10 mg by mouth daily.   exemestane (AROMASIN) 25 MG tablet Take 1 tablet (25 mg total) by mouth daily after breakfast.   furosemide (LASIX) 40 MG tablet Take 40 mg by mouth 2 (two) times daily as needed for fluid.   gabapentin (NEURONTIN) 300 MG capsule Take 1 capsule (300 mg total) by mouth 2 (two) times daily. Start at 1 cap once daily, and increase to twice daily if she tolerates. Continue 676m at night (Patient taking differently: Take 300-600 mg by mouth See admin instructions. Take 300 mg by mouth in the morning and 600 mg at night)   glucose blood (ACCU-CHEK AVIVA PLUS) test strip Check sugar 1x daily (Patient taking differently: 1 each by Other route See admin instructions. Check sugar 1x daily)   insulin glargine (LANTUS SOLOSTAR) 100 UNIT/ML Solostar Pen Inject 25 Units into the skin 2 (two) times daily.   Insulin Pen Needle (PEN NEEDLES) 30G X 8 MM MISC 1 each  by Does not apply route daily. E11.9   magnesium oxide (MAG-OX) 400 MG tablet Take 400 mg by mouth 2 (two) times daily.   mycophenolate (MYFORTIC) 180 MG EC tablet Take 360 mg by mouth 2 (two) times daily.   NOVOLOG FLEXPEN 100 UNIT/ML FlexPen Inject 5 Units into the skin 3 (three) times daily as needed (blood sugar of 200 or above).   ondansetron (ZOFRAN) 8 MG tablet Take 1 tablet (8 mg total) by mouth 2 (two) times daily as needed for refractory nausea / vomiting. Start on day 3 after chemo.   oxyCODONE (OXY IR/ROXICODONE) 5 MG immediate release tablet Take 1 tablet (5 mg total) by mouth every 6  (six) hours as needed for severe pain. For AFTER surgery only, do not take and drive   pantoprazole (PROTONIX) 40 MG tablet Take 40 mg by mouth 2 (two) times daily.   predniSONE (DELTASONE) 5 MG tablet Take 5 mg by mouth daily.    rosuvastatin (CRESTOR) 5 MG tablet Take 5 mg by mouth at bedtime.   senna-docusate (SENOKOT-S) 8.6-50 MG tablet Take 2 tablets by mouth at bedtime. For AFTER surgery, do not take if having diarrhea   tacrolimus (PROGRAF) 1 MG capsule Take 3 mg by mouth daily.   tirzepatide [facility]) 7.5 MG/0.5ML Pen Inject 7.5 mg into the skin every Thursday.   No current facility-administered medications for this visit. (Other)   REVIEW OF SYSTEMS: ROS   Positive for: Endocrine, Eyes, Allergic/Imm Negative for: Constitutional, Gastrointestinal, Neurological, Skin, Genitourinary, Musculoskeletal, HENT, Cardiovascular, Respiratory, Psychiatric, Heme/Lymph Last edited by Theodore Demark, COA on 01/23/2022  2:11 PM.     ALLERGIES Allergies  Allergen Reactions   Docetaxel Nausea Only    Diaphoretic.  Patient had hypersensitivity reaction to docetaxel.  See progress note from 05/24/2021.  Patient able to complete infusion.   Norvasc [Amlodipine] Swelling   Tape Other (See Comments)    Burns skin.  Please use paper tape only   PAST MEDICAL HISTORY Past Medical History:  Diagnosis Date   Allergy    pollen   Anemia    Anxiety    BRCA2 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 03/01/2021   Breast cancer [facility])    Cataract    surgical repair bilateral   Complication of anesthesia    not herself for 2 days after last surgery (brest lumpectomy)   Coronary artery disease    Depression    ESRD on hemodialysis ([place])    Home HD 5x per week- not on dialysis now had tramsplant 4/17   Family history of ovarian cancer 02/22/2021   GERD (gastroesophageal reflux disease)    Hearing loss 2017   right ear   History of blood transfusion    transfusion reaction   Hyperlipidemia     Hypertension    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 per pt-dr levy also II; Per patient 08/18/21 she is Type 2   Kidney transplant recipient    Neuromuscular disorder ([place])    NEUROPATHY   Sleep apnea    Past Surgical History:  Procedure Laterality Date   ABDOMINAL HYSTERECTOMY     BREAST EXCISIONAL BIOPSY Right 08/2015   BREAST LUMPECTOMY WITH RADIOACTIVE SEED LOCALIZATION Right 09/14/2015   Procedure: RIGHT BREAST LUMPECTOMY WITH RADIOACTIVE SEED LOCALIZATION;  Surgeon: Donnie Mesa, MD;  Location: [facility];  Service: General;  Laterality: Right;   BREAST LUMPECTOMY WITH RADIOACTIVE SEED LOCALIZATION Left 03/02/2021  Procedure: LEFT BREAST SEED LUMPECTOMY LEFT SENTINEL LYMPH NODE MAPPING;  Surgeon: Erroll Luna, MD;  Location: [facility];  Service: General;  Laterality: Left;  GEN AND PEC BLOCK   BREAST SURGERY Bilateral    biopsy bilateral   CARDIAC CATHETERIZATION     CATARACT EXTRACTION Bilateral    bilateral   CHOLECYSTECTOMY     CYST REMOVAL NECK     DIALYSIS FISTULA CREATION Left    EYE SURGERY Bilateral    lazer   kidney transplant     LEFT HEART CATH AND CORONARY ANGIOGRAPHY N/A 03/04/2019   Procedure: LEFT HEART CATH AND CORONARY ANGIOGRAPHY;  Surgeon: Martinique, Peter M, MD;  Location: [facility] CV LAB;  Service: Cardiovascular;  Laterality: N/A;   LEFT HEART CATHETERIZATION WITH CORONARY ANGIOGRAM N/A 03/30/2014   Procedure: LEFT HEART CATHETERIZATION WITH CORONARY ANGIOGRAM;  Surgeon: Sinclair Grooms, MD;  Location: [facility] CATH LAB;  Service: Cardiovascular;  Laterality: N/A;   LIPOMA EXCISION N/A 02/06/2017   Procedure: EXCISION POSTERIOR NECK SEBACEOUS CYST;  Surgeon: Coralie Keens, MD;  Location: [facility];  Service: General;  Laterality: N/A;   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   Procedure: REPAIR OF ARTERIOVENOUS FISTULA ANEURYSM;  Surgeon: Serafina Mitchell, MD;  Location: [facility] OR;  Service: Vascular;  Laterality: Left;   REVISON OF ARTERIOVENOUS FISTULA Left 09/28/2013   Procedure: EXCISE ESCHAR LEFT ARM  ARTERIOVENOUS FISTULA WITH PLICATION OF LEFT ARM ARTERIOVENOUS FISTULA;  Surgeon: Elam Dutch, MD;  Location[facility] OR;  Service: Vascular;  Laterality: Left;   REVISON OF ARTERIOVENOUS FISTULA Left 08/26/2015   Procedure: RESECTION ANEURYSM OF LEFT ARM ARTERIOVENOUS FISTULA  ;  Surgeon: Serafina Mitchell, MD;  Location: [facility] OR;  Service: Vascular;  Laterality: Left;   SIMPLE MASTECTOMY WITH AXILLARY SENTINEL NODE BIOPSY Bilateral 08/22/2021   Procedure: BILATERAL SIMPLE MASTECTOMY;  Surgeon: Erroll Luna, MD;  Location: [facility] OR;  Service: General;  Laterality: Bilateral;   TUBAL LIGATION     FAMILY HISTORY Family History  Problem Relation Age of Onset   Diabetes Mother    Kidney disease Mother    Diabetes Father    Heart disease Father    Kidney disease Father    Hypertension Father    Diabetes Sister    Asthma Sister    Lupus Sister    Diabetes Brother    Diabetes Brother    Diabetes Brother    Diabetes Brother    Cancer Maternal Grandmother 74       ovarian cancer   Ovarian cancer Maternal Grandmother    Diabetes Maternal Grandfather    Diabetes Paternal Grandmother    Colon cancer Neg Hx    Colon polyps Neg Hx    Esophageal cancer Neg Hx    Rectal cancer Neg Hx    Stomach cancer Neg Hx    Breast cancer Neg Hx    Endometrial cancer Neg Hx    Pancreatic cancer Neg Hx    Prostate cancer Neg Hx    SOCIAL HISTORY Social History   Tobacco Use   Smoking status: Never   Smokeless tobacco: Never  Vaping Use   Vaping Use: Never used  Substance Use Topics   Alcohol use: Not Currently    Comment: Socially   Drug use: No       OPHTHALMIC EXAM:  Base Eye Exam     Visual Acuity (Snellen - Linear)       Right Left   Dist  cc 20/25 -2 20/20    Correction: Glasses         Tonometry (Tonopen, 2:07 PM)        Right Left   Pressure 22 16         Pupils       Dark Light Shape React APD   Right 2 1 Round Minimal None   Left 2 1 Round Minimal None         Visual Fields (Counting fingers)       Left Right    Full Full         Extraocular Movement       Right Left    Full, Ortho Full, Ortho         Neuro/Psych     Oriented x3: Yes   Mood/Affect: Normal         Dilation     Both eyes: 1.0% Mydriacyl, 2.5% Phenylephrine @ 2:07 PM           Slit Lamp and Fundus Exam     Slit Lamp Exam       Right Left   Lids/Lashes Dermatochalasis - upper lid Dermatochalasis - upper lid   Conjunctiva/Sclera nasal pingeucula nasal pingeucula   Cornea trace PEE, early band K nasal and temporal trace PEE, early band K nasal and temporal   Anterior Chamber Deep and quiet Deep and quiet   Iris Round and moderately dilated to 4.0, No NVI Round and moderately dilated to 4.5, No NVI   Lens Posterior chamber intraocular lens, 1+ Posterior capsular opacification Posterior chamber intraocular lens, 1+ Posterior capsular opacification   Anterior Vitreous Vitreous syneresis Vitreous syneresis, vitreous condensations/fibrosis         Fundus Exam       Right Left   Disc trace Pallor, +PPP, no NVD, Sharp rim Trace Pallor, +fibrosis, no NVD, +PPP IT   C/D Ratio 0.3 0.4   Macula Flat, Blunted foveal reflex, rare MA, focal laser scars temporally Flat, Blunted foveal reflex, rare MA, focal laser scars temporally   Vessels Severe attenuation, +sclerosis, Tortuous, Copper wiring Severe attenuation, Copper wiring, +perivascular sheathing, Tortuous, AV crossing changes, regressed NVD   Periphery Attached, dense 360 PRP Attached, 360 PRP, room for fill in if needed           Refraction     Wearing Rx       Sphere Cylinder Axis   Right -0.50 +2.25 084   Left [place] +2.00 093           IMAGING AND PROCEDURES  Imaging and Procedures for 01/23/2022  OCT, Retina - OU - Both Eyes        Right Eye Quality was good. Central Foveal Thickness: 255. Progression has been stable. Findings include normal foveal contour, no IRF, no SRF, outer retinal atrophy (No DME; +ORA corresponding to laser changes -- stable).   Left Eye Quality was good. Central Foveal Thickness: 230. Progression has been stable. Findings include normal foveal contour, no IRF, no SRF, outer retinal atrophy (No DME; patches of ORA corresponding to laser changes, +ERM).   Notes *Images captured and stored on drive  Diagnosis / Impression:  OD: No DME; +ORA corresponding to laser changes OS: No DME; patches of ORA corresponding to laser changes, +ERM  Clinical management:  See below  Abbreviations: NFP - Normal foveal profile. CME - cystoid macular edema. PED - pigment epithelial detachment. IRF - intraretinal fluid. SRF - subretinal fluid. EZ - ellipsoid zone.  ERM - epiretinal membrane. ORA - outer retinal atrophy. ORT - outer retinal tubulation. SRHM - subretinal hyper-reflective material. IRHM - intraretinal hyper-reflective material            ASSESSMENT/PLAN:    ICD-10-CM   1. Stable proliferative diabetic retinopathy of both eyes associated with type 2 diabetes mellitus (HCC)  E11.3553 OCT, Retina - OU - Both Eyes    2. Essential hypertension  I10     3. Hypertensive retinopathy of both eyes  H35.033     4. Pseudophakia, both eyes  Z96.1      1. Proliferative diabetic retinopathy w/o DME OU  - s/p PRP OU and focal laser OU (?early 2000's - [place]) - The incidence, risk factors for progression, natural history and treatment options for diabetic retinopathy were discussed with patient.   - The need for close monitoring of blood glucose, blood pressure, and serum lipids, avoiding cigarette or any type of tobacco, and the need for long term follow up was also discussed with patient. - exam shows rare MA, dense PRP 360 OU -- stable exam - BCVA stable at 20/25 OD, 20/20 OS - FA  (04.03.23) shows mild MA, no NV OU - OCT without diabetic macular edema, both eyes  - discussed findings and prognosis - f/u in 9-12 months -- DFE/OCT  2,3. Hypertensive retinopathy OU - discussed importance of tight BP control - monitor  4. Pseudophakia OU  - s/p CE/IOL OU  - IOLs in good position, doing well  - monitor  Ophthalmic Meds Ordered this visit:  No orders of the defined types were placed in this encounter.    Return for f/u 9-12 months, PDR OU, DFE, OCT.  There are no Patient Instructions on file for this visit.   Explained the diagnoses, plan, and follow up with the patient and they expressed understanding.  Patient expressed understanding of the importance of proper follow up care.   This document serves as a record of services personally performed by Gardiner Sleeper, MD, PhD. It was created on their behalf by San Jetty. Owens Shark, OA an ophthalmic technician. The creation of this record is the provider's dictation and/or activities during the visit.    Electronically signed by: San Jetty. Owens Shark, [place] 08.01.2023 9:13 PM  Gardiner Sleeper, M.D., Ph.D. Diseases & Surgery of the Retina and Vitreous [facility]  I have reviewed the above documentation for accuracy and completeness, and I agree with the above. Gardiner Sleeper, M.D., Ph.D. 01/23/22 9:14 PM   Abbreviations: M myopia (nearsighted); A astigmatism; H hyperopia (farsighted); P presbyopia; Mrx spectacle prescription;  CTL contact lenses; OD right eye; OS left eye; OU both eyes  XT exotropia; ET esotropia; PEK punctate epithelial keratitis; PEE punctate epithelial erosions; DES dry eye syndrome; MGD meibomian gland dysfunction; ATs artificial tears; PFAT's preservative free artificial tears; [place] nuclear sclerotic cataract; PSC posterior subcapsular cataract; ERM epi-retinal membrane; PVD posterior vitreous detachment; RD retinal detachment; DM diabetes mellitus; DR diabetic retinopathy; NPDR  non-proliferative diabetic retinopathy; PDR proliferative diabetic retinopathy; CSME clinically significant macular edema; DME diabetic macular edema; dbh dot blot hemorrhages; CWS cotton wool spot; POAG primary open angle glaucoma; C/D cup-to-disc ratio; HVF humphrey visual field; GVF goldmann visual field; OCT optical coherence tomography; IOP intraocular pressure; BRVO Branch retinal vein occlusion; CRVO central retinal vein occlusion; CRAO central retinal artery occlusion; BRAO branch retinal artery occlusion; RT retinal tear; SB scleral buckle; PPV pars plana vitrectomy; VH Vitreous hemorrhage;  PRP panretinal laser photocoagulation; IVK intravitreal kenalog; VMT vitreomacular traction; MH Macular hole;  NVD neovascularization of the disc; NVE neovascularization elsewhere; AREDS age related eye disease study; ARMD age related macular degeneration; POAG primary open angle glaucoma; EBMD epithelial/anterior basement membrane dystrophy; ACIOL anterior chamber intraocular lens; IOL intraocular lens; PCIOL posterior chamber intraocular lens; Phaco/IOL phacoemulsification with intraocular lens placement; [place] photorefractive keratectomy; LASIK laser assisted in situ keratomileusis; HTN hypertension; DM diabetes mellitus; COPD chronic obstructive pulmonary disease

## 2022-01-24 ENCOUNTER — Other Ambulatory Visit: Payer: Self-pay

## 2022-01-26 ENCOUNTER — Telehealth: Payer: Self-pay | Admitting: *Deleted

## 2022-01-26 NOTE — Telephone Encounter (Signed)
Spoke with pt's PCP office requesting office notes from pt's office visit for 01/24/22. Front desk receptionist stated she will send the message to the clinical staff and we should receive it by end of day today or Monday.

## 2022-02-01 ENCOUNTER — Other Ambulatory Visit: Payer: Self-pay | Admitting: Hematology

## 2022-02-05 ENCOUNTER — Other Ambulatory Visit: Payer: Medicare HMO

## 2022-02-05 ENCOUNTER — Encounter: Payer: Medicare HMO | Admitting: Adult Health

## 2022-02-05 ENCOUNTER — Ambulatory Visit: Payer: Medicare HMO | Admitting: Hematology

## 2022-02-08 ENCOUNTER — Encounter: Payer: Self-pay | Admitting: Adult Health

## 2022-02-08 ENCOUNTER — Other Ambulatory Visit: Payer: Self-pay

## 2022-02-08 ENCOUNTER — Inpatient Hospital Stay: Payer: Medicare HMO | Attending: Hematology

## 2022-02-08 ENCOUNTER — Inpatient Hospital Stay (HOSPITAL_BASED_OUTPATIENT_CLINIC_OR_DEPARTMENT_OTHER): Payer: Medicare HMO | Admitting: Adult Health

## 2022-02-08 VITALS — BP 110/58 | HR 80 | Temp 97.9°F | Resp 16 | Ht 61.0 in | Wt 185.1 lb

## 2022-02-08 DIAGNOSIS — C50312 Malignant neoplasm of lower-inner quadrant of left female breast: Secondary | ICD-10-CM | POA: Diagnosis not present

## 2022-02-08 DIAGNOSIS — Z170 Estrogen receptor positive status [ER+]: Secondary | ICD-10-CM | POA: Insufficient documentation

## 2022-02-08 DIAGNOSIS — Z1501 Genetic susceptibility to malignant neoplasm of breast: Secondary | ICD-10-CM | POA: Diagnosis not present

## 2022-02-08 DIAGNOSIS — Z9013 Acquired absence of bilateral breasts and nipples: Secondary | ICD-10-CM | POA: Insufficient documentation

## 2022-02-08 DIAGNOSIS — L299 Pruritus, unspecified: Secondary | ICD-10-CM | POA: Insufficient documentation

## 2022-02-08 DIAGNOSIS — Z1509 Genetic susceptibility to other malignant neoplasm: Secondary | ICD-10-CM

## 2022-02-08 DIAGNOSIS — Z79899 Other long term (current) drug therapy: Secondary | ICD-10-CM | POA: Insufficient documentation

## 2022-02-08 DIAGNOSIS — Z79811 Long term (current) use of aromatase inhibitors: Secondary | ICD-10-CM | POA: Insufficient documentation

## 2022-02-08 DIAGNOSIS — E2839 Other primary ovarian failure: Secondary | ICD-10-CM | POA: Diagnosis not present

## 2022-02-08 DIAGNOSIS — Z1502 Genetic susceptibility to malignant neoplasm of ovary: Secondary | ICD-10-CM

## 2022-02-08 LAB — CBC WITH DIFFERENTIAL (CANCER CENTER ONLY)
Abs Immature Granulocytes: 0.01 10*3/uL (ref 0.00–0.07)
Basophils Absolute: 0.1 10*3/uL (ref 0.0–0.1)
Basophils Relative: 1 %
Eosinophils Absolute: 0.2 10*3/uL (ref 0.0–0.5)
Eosinophils Relative: 4 %
HCT: 35.9 % — ABNORMAL LOW (ref 36.0–46.0)
Hemoglobin: 11.9 g/dL — ABNORMAL LOW (ref 12.0–15.0)
Immature Granulocytes: 0 %
Lymphocytes Relative: 24 %
Lymphs Abs: 1.4 10*3/uL (ref 0.7–4.0)
MCH: 29.5 pg (ref 26.0–34.0)
MCHC: 33.1 g/dL (ref 30.0–36.0)
MCV: 88.9 fL (ref 80.0–100.0)
Monocytes Absolute: 0.5 10*3/uL (ref 0.1–1.0)
Monocytes Relative: 9 %
Neutro Abs: 3.6 10*3/uL (ref 1.7–7.7)
Neutrophils Relative %: 62 %
Platelet Count: 161 10*3/uL (ref 150–400)
RBC: 4.04 MIL/uL (ref 3.87–5.11)
RDW: 13 % (ref 11.5–15.5)
WBC Count: 5.7 10*3/uL (ref 4.0–10.5)
nRBC: 0 % (ref 0.0–0.2)

## 2022-02-08 LAB — CMP (CANCER CENTER ONLY)
ALT: 13 U/L (ref 0–44)
AST: 15 U/L (ref 15–41)
Albumin: 4.4 g/dL (ref 3.5–5.0)
Alkaline Phosphatase: 64 U/L (ref 38–126)
Anion gap: 5 (ref 5–15)
BUN: 46 mg/dL — ABNORMAL HIGH (ref 6–20)
CO2: 34 mmol/L — ABNORMAL HIGH (ref 22–32)
Calcium: 10.6 mg/dL — ABNORMAL HIGH (ref 8.9–10.3)
Chloride: 101 mmol/L (ref 98–111)
Creatinine: 2.13 mg/dL — ABNORMAL HIGH (ref 0.44–1.00)
GFR, Estimated: 27 mL/min — ABNORMAL LOW (ref >60)
Glucose, Bld: 113 mg/dL — ABNORMAL HIGH (ref 70–99)
Potassium: 4.3 mmol/L (ref 3.5–5.1)
Sodium: 140 mmol/L (ref 135–145)
Total Bilirubin: 0.5 mg/dL (ref 0.3–1.2)
Total Protein: 6.9 g/dL (ref 6.5–8.1)

## 2022-02-08 MED ORDER — KETOCONAZOLE 2 % EX SHAM: 1.0000 | MEDICATED_SHAMPOO | CUTANEOUS | 0 refills | Status: DC

## 2022-02-08 NOTE — Progress Notes (Signed)
SURVIVORSHIP VISIT:   BRIEF ONCOLOGIC HISTORY:  Oncology History Overview Note  Cancer Staging Malignant neoplasm of lower-inner quadrant of left breast in female, estrogen receptor positive ([place]) Staging form: Breast, AJCC 8th Edition - Clinical stage from 02/09/2021: Stage IA (cT1c, cN0, cM0, G2, ER+, PR+, HER2-) - Signed by Truitt Merle, MD on 02/20/2021 Stage prefix: Initial diagnosis Histologic grading system: 3 grade system - Pathologic stage from 03/02/2021: Stage IA (pT2, pN0, cM0, G2, ER+, PR+, HER2-, Oncotype DX score: 26) - Signed by Truitt Merle, MD on 03/21/2021 Stage prefix: Initial diagnosis Multigene prognostic tests performed: Oncotype DX Recurrence score range: Greater than or equal to 11 Histologic grading system: 3 grade system Residual tumor (R): R0 - None    Malignant neoplasm of lower-inner quadrant of left breast in female, estrogen receptor positive ([place])  02/07/2021 Mammogram   Exam: Left Diagnostic Mammogram; Left Breast Ultrasound  IMPRESSION: Irregular mass in left breast at 6:30, measuring 1.6 cm by mammogram, is highly suggestive of malignancy. Left axillary ultrasound is negative for lymphadenopathy.   02/09/2021 Cancer Staging   Staging form: Breast, AJCC 8th Edition - Clinical stage from 02/09/2021: Stage IA (cT1c, cN0, cM0, G2, ER+, PR+, HER2-) - Signed by Truitt Merle, MD on 02/20/2021 Stage prefix: Initial diagnosis Histologic grading system: 3 grade system   02/09/2021 Pathology Results   Diagnosis Breast, left, needle core biopsy, 6:30 o'clock 8cm fn - INVASIVE DUCTAL CARCINOMA WITH HISTIOCYTOID FEATURES. SEE NOTE Diagnosis Note Carcinoma measures 1 cm in greatest linear dimension and appears grade 2.  PROGNOSTIC INDICATORS Results: IMMUNOHISTOCHEMICAL AND MORPHOMETRIC ANALYSIS PERFORMED MANUALLY The tumor cells are EQUIVOCAL for Her2 (2+). Her2 by FISH will be performed and results performed separately. Estrogen Receptor: >95%, POSITIVE, STRONG STAINING  INTENSITY Progesterone Receptor: 90%, POSITIVE, STRONG STAINING INTENSITY Proliferation Marker Ki67: 20%  FLUORESCENCE IN-SITU HYBRIDIZATION Results: GROUP 5: HER2 **NEGATIVE** Equivocal form of amplification of the HER2 gene was detected in the IHC 2+ tissue sample received from this individual. HER2 FISH was performed by a technologist and cell imaging and analysis on the [employer]. RATIO OF HER2/CEN17 SIGNALS 1.30 AVERAGE HER2 COPY NUMBER PER CELL 1.75   02/15/2021 Initial Diagnosis   Malignant neoplasm of lower-inner quadrant of left breast in female, estrogen receptor positive ([place])   03/01/2021 Genetic Testing   Positive genetic testing: pathogenic variant detected in BRCA2 at [id number].  No other pathogenic variants detected in [facility] CustomNext Panel.  The report date is 03/19/2021.  The CustomNext-Cancer+RNAinsight panel offered by [facility] includes sequencing and rearrangement analysis for the following 47 genes:  APC, ATM, AXIN2, BARD1, BMPR1A, BRCA1, BRCA2, BRIP1, CDH1, CDK4, CDKN2A, CHEK2, DICER1, EPCAM, GREM1, HOXB13, MEN1, MLH1, MSH2, MSH3, MSH6, MUTYH, NBN, NF1, NF2, NTHL1, PALB2, PMS2, POLD1, POLE, PTEN, RAD51C, RAD51D, RECQL, RET, SDHA, SDHAF2, SDHB, SDHC, SDHD, SMAD4, SMARCA4, STK11, TP53, TSC1, TSC2, and VHL.  RNA data is routinely analyzed for use in variant interpretation for all genes.   03/02/2021 Cancer Staging   Staging form: Breast, AJCC 8th Edition - Pathologic stage from 03/02/2021: Stage IA (pT2, pN0, cM0, G2, ER+, PR+, HER2-, Oncotype DX score: 26) - Signed by Truitt Merle, MD on 03/21/2021 Stage prefix: Initial diagnosis Multigene prognostic tests performed: Oncotype DX Recurrence score range: Greater than or equal to 11 Histologic grading system: 3 grade system Residual tumor (R): R0 - None   05/02/2021 - 07/12/2021 Chemotherapy   Patient is on Treatment Plan : BREAST TC q21d      08/22/2021 Pathology Results   FINAL  MICROSCOPIC DIAGNOSIS:   A. BREAST,  LEFT, MASTECTOMY:  - Fibrocystic changes.  - Lumpectomy scar with hemosiderin deposition.  - No malignancy identified.   B. BREAST, RIGHT, MASTECTOMY:  - Fibrocystic changes with focal fibroadenomatoid change.  - No malignancy identified.      INTERVAL HISTORY:  Cynthia [name] to review her survivorship care plan detailing her treatment course for breast cancer, as well as monitoring long-term side effects of that treatment, education regarding health maintenance, screening, and overall wellness and health promotion.     Overall, Cynthia [name] reports feeling quite well .  She is taking exemestane daily.  She says she tolerates it well.  Since her chemotherapy completion she notes some occasional areas on her scalp that itch.    Cynthia was scheduled to undergo BSO due to BRCA 2 positivity, however her A1C was too high and surgeons are waiting on it to get down to between 7-8.  She recently started Monjouro.  She says her last A1c was 8.5.    REVIEW OF SYSTEMS:  Review of Systems  Constitutional:  Negative for appetite change, chills, fatigue, fever and unexpected weight change.  HENT:   Negative for hearing loss, lump/mass and trouble swallowing.   Eyes:  Negative for eye problems and icterus.  Respiratory:  Negative for chest tightness, cough and shortness of breath.   Cardiovascular:  Negative for chest pain, leg swelling and palpitations.  Gastrointestinal:  Negative for abdominal distention, abdominal pain, constipation, diarrhea, nausea and vomiting.  Endocrine: Negative for hot flashes.  Genitourinary:  Negative for difficulty urinating.   Musculoskeletal:  Negative for arthralgias.  Skin:  Negative for itching and rash.  Neurological:  Negative for dizziness, extremity weakness, headaches and numbness.  Hematological:  Negative for adenopathy. Does not bruise/bleed easily.  Psychiatric/Behavioral:  Negative for depression. The patient is not nervous/anxious.    Breast: Denies any  new nodularity, masses, tenderness, nipple changes, or nipple discharge.      ONCOLOGY TREATMENT TEAM:  1. Surgeon:  Dr. Brantley Stage at [facility] 2. Medical Oncologist: Dr. Burr Medico      PAST MEDICAL/SURGICAL HISTORY:  Past Medical History:  Diagnosis Date   Allergy    pollen   Anemia    Anxiety    BRCA2 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 03/01/2021   Breast cancer [facility])    Cataract    surgical repair bilateral   Complication of anesthesia    not herself for 2 days after last surgery (brest lumpectomy)   Coronary artery disease    Depression    ESRD on hemodialysis ([place])    Home HD 5x per week- not on dialysis now had tramsplant 4/17   Family history of ovarian cancer 02/22/2021   GERD (gastroesophageal reflux disease)    Hearing loss 2017   right ear   History of blood transfusion    transfusion reaction   Hyperlipidemia    Hypertension    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 per pt-dr levy also II; Per patient 08/18/21 she is Type 2   Kidney transplant recipient    Neuromuscular disorder ([place])    NEUROPATHY   Sleep apnea    Past Surgical History:  Procedure Laterality Date   ABDOMINAL HYSTERECTOMY     BREAST EXCISIONAL BIOPSY Right 08/2015   BREAST LUMPECTOMY WITH RADIOACTIVE SEED LOCALIZATION Right 09/14/2015   Procedure: RIGHT BREAST LUMPECTOMY WITH RADIOACTIVE SEED LOCALIZATION;  Surgeon: Donnie Mesa, MD;  Location:  [place];  Service: General;  Laterality: Right;   BREAST LUMPECTOMY WITH RADIOACTIVE SEED LOCALIZATION Left 03/02/2021   Procedure: LEFT BREAST SEED LUMPECTOMY LEFT SENTINEL LYMPH NODE MAPPING;  Surgeon: Erroll Luna, MD;  Location: [place];  Service: General;  Laterality: Left;  GEN AND PEC BLOCK   BREAST SURGERY Bilateral    biopsy bilateral   CARDIAC CATHETERIZATION     CATARACT EXTRACTION Bilateral    bilateral   CHOLECYSTECTOMY     CYST  REMOVAL NECK     DIALYSIS FISTULA CREATION Left    EYE SURGERY Bilateral    lazer   kidney transplant     LEFT HEART CATH AND CORONARY ANGIOGRAPHY N/A 03/04/2019   Procedure: LEFT HEART CATH AND CORONARY ANGIOGRAPHY;  Surgeon: Martinique, Peter M, MD;  Location: [place] CV LAB;  Service: Cardiovascular;  Laterality: N/A;   LEFT HEART CATHETERIZATION WITH CORONARY ANGIOGRAM N/A 03/30/2014   Procedure: LEFT HEART CATHETERIZATION WITH CORONARY ANGIOGRAM;  Surgeon: Sinclair Grooms, MD;  Location: [facility] CATH LAB;  Service: Cardiovascular;  Laterality: N/A;   LIPOMA EXCISION N/A 02/06/2017   Procedure: EXCISION POSTERIOR NECK SEBACEOUS CYST;  Surgeon: Coralie Keens, MD;  Location: [place];  Service: General;  Laterality: N/A;   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   Procedure: REPAIR OF ARTERIOVENOUS FISTULA ANEURYSM;  Surgeon: Serafina Mitchell, MD;  Location: [facility] OR;  Service: Vascular;  Laterality: Left;   REVISON OF ARTERIOVENOUS FISTULA Left 09/28/2013   Procedure: EXCISE ESCHAR LEFT ARM  ARTERIOVENOUS FISTULA WITH PLICATION OF LEFT ARM ARTERIOVENOUS FISTULA;  Surgeon: Elam Dutch, MD;  Location: [place];  Service: Vascular;  Laterality: Left;   REVISON OF ARTERIOVENOUS FISTULA Left 08/26/2015   Procedure: RESECTION ANEURYSM OF LEFT ARM ARTERIOVENOUS FISTULA  ;  Surgeon: Serafina Mitchell, MD;  Location: [place];  Service: Vascular;  Laterality: Left;   SIMPLE MASTECTOMY WITH AXILLARY SENTINEL NODE BIOPSY Bilateral 08/22/2021   Procedure: BILATERAL SIMPLE MASTECTOMY;  Surgeon: Erroll Luna, MD;  Location: [place];  Service: General;  Laterality: Bilateral;   TUBAL LIGATION       ALLERGIES:  Allergies  Allergen Reactions   Docetaxel Nausea Only    Diaphoretic.  Patient had hypersensitivity reaction to docetaxel.  See progress note from 05/24/2021.  Patient able to complete infusion.   Norvasc [Amlodipine] Swelling   Tape Other (See Comments)    Burns skin.  Please use  paper tape only     CURRENT MEDICATIONS:  Outpatient Encounter Medications as of 02/08/2022  Medication Sig   Accu-Chek Softclix Lancets lancets Use as instructed (Patient taking differently: 1 each by Other route See admin instructions. Use as instructed)   acetaminophen (TYLENOL) 500 MG tablet Take 500 mg by mouth every 6 (six) hours as needed for mild pain, fever or headache.   aspirin EC 81 MG EC tablet Take 1 tablet (81 mg total) by mouth daily.   Blood Glucose Monitoring Suppl (ACCU-CHEK AVIVA CONNECT) w/Device KIT Check sugar 1x daily (Patient taking differently: 1 each by Other route See admin instructions. Check sugar 1x daily)   carvedilol (COREG) 25 MG tablet Take 25 mg by mouth 2 (two) times daily with a meal.   cetirizine (ZYRTEC) 10 MG tablet Take 10 mg by mouth daily.   cholecalciferol (VITAMIN D3) 25 MCG (1000 UNIT) tablet Take 1,000 Units by mouth daily.   cyanocobalamin 1000 MCG tablet Take 1,000 mcg by mouth daily.   escitalopram (LEXAPRO)  10 MG tablet Take 10 mg by mouth daily.   exemestane (AROMASIN) 25 MG tablet TAKE 1 TABLET(25 MG) BY MOUTH DAILY AFTER BREAKFAST   furosemide (LASIX) 40 MG tablet Take 40 mg by mouth 2 (two) times daily as needed for fluid.   gabapentin (NEURONTIN) 300 MG capsule Take 1 capsule (300 mg total) by mouth 2 (two) times daily. Start at 1 cap once daily, and increase to twice daily if she tolerates. Continue 620m at night (Patient taking differently: Take 300-600 mg by mouth See admin instructions. Take 300 mg by mouth in the morning and 600 mg at night)   glucose blood (ACCU-CHEK AVIVA PLUS) test strip Check sugar 1x daily (Patient taking differently: 1 each by Other route See admin instructions. Check sugar 1x daily)   insulin glargine (LANTUS SOLOSTAR) 100 UNIT/ML Solostar Pen Inject 25 Units into the skin 2 (two) times daily.   Insulin Pen Needle (PEN NEEDLES) 30G X 8 MM MISC 1 each by Does not apply route daily. E11.9   magnesium oxide  (MAG-OX) 400 MG tablet Take 400 mg by mouth 2 (two) times daily.   mycophenolate (MYFORTIC) 180 MG EC tablet Take 360 mg by mouth 2 (two) times daily.   NOVOLOG FLEXPEN 100 UNIT/ML FlexPen Inject 5 Units into the skin 3 (three) times daily as needed (blood sugar of 200 or above).   ondansetron (ZOFRAN) 8 MG tablet Take 1 tablet (8 mg total) by mouth 2 (two) times daily as needed for refractory nausea / vomiting. Start on day 3 after chemo.   oxyCODONE (OXY IR/ROXICODONE) 5 MG immediate release tablet Take 1 tablet (5 mg total) by mouth every 6 (six) hours as needed for severe pain. For AFTER surgery only, do not take and drive   pantoprazole (PROTONIX) 40 MG tablet Take 40 mg by mouth 2 (two) times daily.   predniSONE (DELTASONE) 5 MG tablet Take 5 mg by mouth daily.    rosuvastatin (CRESTOR) 5 MG tablet Take 5 mg by mouth at bedtime.   senna-docusate (SENOKOT-S) 8.6-50 MG tablet Take 2 tablets by mouth at bedtime. For AFTER surgery, do not take if having diarrhea   tacrolimus (PROGRAF) 1 MG capsule Take 3 mg by mouth daily.   tirzepatide ([facility] 7.5 MG/0.5ML Pen Inject 7.5 mg into the skin every Thursday.   No facility-administered encounter medications on file as of 02/08/2022.     ONCOLOGIC FAMILY HISTORY:  Family History  Problem Relation Age of Onset   Diabetes Mother    Kidney disease Mother    Diabetes Father    Heart disease Father    Kidney disease Father    Hypertension Father    Diabetes Sister    Asthma Sister    Lupus Sister    Diabetes Brother    Diabetes Brother    Diabetes Brother    Diabetes Brother    Cancer Maternal Grandmother 545      ovarian cancer   Ovarian cancer Maternal Grandmother    Diabetes Maternal Grandfather    Diabetes Paternal Grandmother    Colon cancer Neg Hx    Colon polyps Neg Hx    Esophageal cancer Neg Hx    Rectal cancer Neg Hx    Stomach cancer Neg Hx    Breast cancer Neg Hx    Endometrial cancer Neg Hx    Pancreatic cancer Neg  Hx    Prostate cancer Neg Hx       SOCIAL HISTORY:  Social History  Socioeconomic History   Marital status: Legally Separated    Spouse name: Not on file   Number of children: 3   Years of education: 9   Highest education level: Not on file  Occupational History   Occupation: disabled  Tobacco Use   Smoking status: Never   Smokeless tobacco: Never  Vaping Use   Vaping Use: Never used  Substance and Sexual Activity   Alcohol use: Not Currently    Comment: Socially   Drug use: No   Sexual activity: Not Currently    Birth control/protection: None  Other Topics Concern   Not on file  Social History Narrative   Lives in home with daughters, grand daughter, son-in-law   Caffeine use - 1 cup coffee sometimes    Social Determinants of Health   Financial Resource Strain: High Risk (02/22/2021)   Overall Financial Resource Strain (CARDIA)    Difficulty of Paying Living Expenses: Hard  Food Insecurity: Food Insecurity Present (02/22/2021)   Hunger Vital Sign    Worried About Running Out of Food in the Last Year: Sometimes true    Ran Out of Food in the Last Year: Sometimes true  Transportation Needs: No Transportation Needs (02/22/2021)   PRAPARE - Hydrologist (Medical): No    Lack of Transportation (Non-Medical): No  Physical Activity: Not on file  Stress: Not on file  Social Connections: Not on file  Intimate Partner Violence: Not on file     OBSERVATIONS/OBJECTIVE:  BP (!) 110/58 (BP Location: Right Arm, Patient Position: Sitting)   Pulse 80   Temp 97.9 F (36.6 C) (Temporal)   Resp 16   Ht _0  (1.549 m)   Wt 185 lb 1.6 oz (84 kg)   SpO2 95%   BMI 34.97 kg/m  GENERAL: Patient is a well appearing female in no acute distress HEENT:  Sclerae anicteric.  Oropharynx clear and moist. No ulcerations or evidence of oropharyngeal candidiasis. Neck is supple.  NODES:  No cervical, supraclavicular, or axillary lymphadenopathy palpated.   BREAST EXAM:  s/p bilateral mastectomies, no sign of local recurrence LUNGS:  Clear to auscultation bilaterally.  No wheezes or rhonchi. HEART:  Regular rate and rhythm. No murmur appreciated. ABDOMEN:  Soft, nontender.  Positive, normoactive bowel sounds. No organomegaly palpated. MSK:  No focal spinal tenderness to palpation. Full range of motion bilaterally in the upper extremities. EXTREMITIES:  No peripheral edema.   SKIN:  Clear with no obvious rashes or skin changes. No nail dyscrasia. NEURO:  Nonfocal. Well oriented.  Appropriate affect.   LABORATORY DATA:  None for this visit.  DIAGNOSTIC IMAGING:  None for this visit.      ASSESSMENT AND PLAN:  Ms.. [name] is a pleasant BRCA positive 51 y.o. female with Stage IA left breast invasive ductal carcinoma, ER+/PR+/HER2-, diagnosed in 01/2021, treated with bilateral mastectomies, adjuvant chemotherapy, and anti-estrogen therapy with Exemestane beginning in 08/2021.  She presents to the Survivorship Clinic for our initial meeting and routine follow-up post-completion of treatment for breast cancer.    1. Stage IA left breast cancer:  Ms. Anastos is continuing to recover from definitive treatment for breast cancer. She will follow-up with her medical oncologist, Dr. Burr Medico in 04/2022 with history and physical exam per surveillance protocol.  She will continue her anti-estrogen therapy with Exemestane.    Today, a comprehensive survivorship care plan and treatment summary was reviewed with the patient today detailing her breast cancer diagnosis, treatment course, potential late/long-term  effects of treatment, appropriate follow-up care with recommendations for the future, and patient education resources.  A copy of this summary, along with a letter will be sent to the patient's primary care provider via mail/fax/In Basket message after today's visit.    2. BRCA 2 positive: She is s/p risk reducing mastecomies.  She is working on getting her A1C  down so she can undergo RRSO.  She has met with gyn oncology and has f/u with her PCP for repeat A1c in about 6 weeks.   3. Scalp itching: I could not see any lesions on her scalp, however I did see some flaking of the skin.  I sent in Ketoconazole 2% shampoo.     4. Bone health:  Given Ms. Berhane's age/history of breast cancer and her current treatment regimen including anti-estrogen therapy with Exemestane, she is at risk for bone demineralization. She has not undergone bone density testing and I placed orders for this to be completed in the next few weeks.  She was given education on specific activities to promote bone health.  5. Cancer screening:  Due to Ms. Fetterman's history and her age, she should receive screening for skin cancers, colon cancer, and gynecologic cancers.  The information and recommendations are listed on the patient's comprehensive care plan/treatment summary and were reviewed in detail with the patient.    6. Health maintenance and wellness promotion: Ms. Deihl was encouraged to consume 5-7 servings of fruits and vegetables per day. We reviewed the "Nutrition Rainbow" handout.  She was also encouraged to engage in moderate to vigorous exercise for 30 minutes per day most days of the week. We discussed the [employer] fitness program, which is designed for cancer survivors to help them become more physically fit after cancer treatments.  She was instructed to limit her alcohol consumption and continue to abstain from tobacco use.     7. Support services/counseling: It is not uncommon for this period of the patient's cancer care trajectory to be one of many emotions and stressors.  She was given information regarding our available services and encouraged to contact me with any questions or for help enrolling in any of our support group/programs.    Follow up instructions:    -Return to cancer center in 8 weeks for f/u with Dr. Burr Medico  -DEXA ordered -She is welcome to return  back to the Survivorship Clinic at any time; no additional follow-up needed at this time.  -Consider referral back to survivorship as a long-term survivor for continued surveillance  The patient was provided an opportunity to ask questions and all were answered. The patient agreed with the plan and demonstrated an understanding of the instructions.   Total encounter time:40 minutes*in face-to-face visit time, chart review, lab review, care coordination, order entry, and documentation of the encounter time.    Wilber Bihari, NP 02/08/22 10:08 AM Medical Oncology and Hematology [facility] [place], [place] [address] Tel. [phone]    Fax. [phone]  *Total Encounter Time as defined by the Centers for Medicare and Medicaid Services includes, in addition to the face-to-face time of a patient visit (documented in the note above) non-face-to-face time: obtaining and reviewing outside history, ordering and reviewing medications, tests or procedures, care coordination (communications with other health care professionals or caregivers) and documentation in the medical record.

## 2022-02-15 ENCOUNTER — Ambulatory Visit (HOSPITAL_BASED_OUTPATIENT_CLINIC_OR_DEPARTMENT_OTHER)
Admission: RE | Admit: 2022-02-15 | Discharge: 2022-02-15 | Disposition: A | Discharge status: Home / Self Care | Payer: Medicare HMO | Source: Ambulatory Visit | Attending: Adult Health | Admitting: Adult Health

## 2022-02-15 DIAGNOSIS — E2839 Other primary ovarian failure: Secondary | ICD-10-CM | POA: Diagnosis present

## 2022-03-05 ENCOUNTER — Encounter (HOSPITAL_BASED_OUTPATIENT_CLINIC_OR_DEPARTMENT_OTHER): Payer: Self-pay | Admitting: Cardiology

## 2022-03-05 ENCOUNTER — Ambulatory Visit (INDEPENDENT_AMBULATORY_CARE_PROVIDER_SITE_OTHER): Payer: Medicare HMO | Admitting: Cardiology

## 2022-03-05 VITALS — BP 134/60 | HR 74 | Ht 61.0 in | Wt 185.2 lb

## 2022-03-05 DIAGNOSIS — I340 Nonrheumatic mitral (valve) insufficiency: Secondary | ICD-10-CM | POA: Diagnosis not present

## 2022-03-05 DIAGNOSIS — E1029 Type 1 diabetes mellitus with other diabetic kidney complication: Secondary | ICD-10-CM | POA: Diagnosis not present

## 2022-03-05 DIAGNOSIS — I10 Essential (primary) hypertension: Secondary | ICD-10-CM

## 2022-03-05 DIAGNOSIS — I700 Atherosclerosis of aorta: Secondary | ICD-10-CM

## 2022-03-05 DIAGNOSIS — Z940 Kidney transplant status: Secondary | ICD-10-CM

## 2022-03-05 DIAGNOSIS — E7800 Pure hypercholesterolemia, unspecified: Secondary | ICD-10-CM | POA: Diagnosis not present

## 2022-03-05 NOTE — Progress Notes (Signed)
Cardiology Office Note:    Date:  03/05/2022   ID:  Cynthia [name], DOB 08/23/1970, MRN [id number]  PCP:  Arthur Holms, NP  Cardiologist:  Buford Dresser, MD  Referring MD: Arthur Holms, NP   CC: follow-up  History of Present Illness:    Cynthia [name] is a 51 y.o. female with a hx of left breast cancer, nonobstructive CAD, type 1 diabetes (diagnosed 1993), hypertension, hyperlipidemia, history of ESRD s/p kidney transplant 2017 who is seen for follow-up. She was initially seen as a new consult on 03/01/21 at the request of Arthur Holms, NP for the evaluation and management of mitral regurgitation and edema.  Today:  She says she has been doing good.  At home, her blood pressures will be around 782 systolic, typically. Some of her lowest readings have been around 55 - 70 systolic. When this occurs, she will sometimes experience dizziness and blackness in her vision. She adds that sometimes she will have pressures around 956 systolic when seated, but it will decrease when she stands.  She denies any palpitations, chest pain, shortness of breath, or peripheral edema. No headaches, syncope, orthopnea, or PND.   Past Medical History:  Diagnosis Date   Allergy    pollen   Anemia    Anxiety    BRCA2 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 03/01/2021   Breast cancer [facility])    Cataract    surgical repair bilateral   Complication of anesthesia    not herself for 2 days after last surgery (brest lumpectomy)   Coronary artery disease    Depression    ESRD on hemodialysis ([place])    Home HD 5x per week- not on dialysis now had tramsplant 4/17   Family history of ovarian cancer 02/22/2021   GERD (gastroesophageal reflux disease)    Hearing loss 2017   right ear   History of blood transfusion    transfusion reaction   Hyperlipidemia    Hypertension    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 per pt-dr levy also II; Per patient 08/18/21  she is Type 2   Kidney transplant recipient    Neuromuscular disorder ([place])    NEUROPATHY   Sleep apnea     Past Surgical History:  Procedure Laterality Date   ABDOMINAL HYSTERECTOMY     BREAST EXCISIONAL BIOPSY Right 08/2015   BREAST LUMPECTOMY WITH RADIOACTIVE SEED LOCALIZATION Right 09/14/2015   Procedure: RIGHT BREAST LUMPECTOMY WITH RADIOACTIVE SEED LOCALIZATION;  Surgeon: Donnie Mesa, MD;  Location: [place];  Service: General;  Laterality: Right;   BREAST LUMPECTOMY WITH RADIOACTIVE SEED LOCALIZATION Left 03/02/2021   Procedure: LEFT BREAST SEED LUMPECTOMY LEFT SENTINEL LYMPH NODE [place];  Surgeon: Erroll Luna, MD;  Location: [place];  Service: General;  Laterality: Left;  GEN AND PEC BLOCK   BREAST SURGERY Bilateral    biopsy bilateral   CARDIAC CATHETERIZATION     CATARACT EXTRACTION Bilateral    bilateral   CHOLECYSTECTOMY     CYST REMOVAL NECK     DIALYSIS FISTULA CREATION Left    EYE SURGERY Bilateral    lazer   kidney transplant     LEFT HEART CATH AND CORONARY ANGIOGRAPHY N/A 03/04/2019   Procedure: LEFT HEART CATH AND CORONARY ANGIOGRAPHY;  Surgeon: Martinique, Peter M, MD;  Location: [place] [facility];  Service: Cardiovascular;  Laterality: N/A;   LEFT HEART CATHETERIZATION WITH CORONARY ANGIOGRAM N/A 03/30/2014   Procedure: LEFT  HEART CATHETERIZATION WITH CORONARY ANGIOGRAM;  Surgeon: Sinclair Grooms, MD;  Location: [facility] CATH LAB;  Service: Cardiovascular;  Laterality: N/A;   LIPOMA EXCISION N/A 02/06/2017   Procedure: EXCISION POSTERIOR NECK SEBACEOUS CYST;  Surgeon: Coralie Keens, MD;  Location: [place];  Service: General;  Laterality: N/A;   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   Procedure: REPAIR OF ARTERIOVENOUS FISTULA ANEURYSM;  Surgeon: Serafina Mitchell, MD;  [facility];  Service: Vascular;  Laterality: Left;   REVISON OF ARTERIOVENOUS FISTULA Left 09/28/2013   Procedure: EXCISE ESCHAR LEFT  ARM  ARTERIOVENOUS FISTULA WITH PLICATION OF LEFT ARM ARTERIOVENOUS FISTULA;  Surgeon: Elam Dutch, MD;  Location: [place];  Service: Vascular;  Laterality: Left;   REVISON OF ARTERIOVENOUS FISTULA Left 08/26/2015   Procedure: RESECTION ANEURYSM OF LEFT ARM ARTERIOVENOUS FISTULA  ;  Surgeon: Serafina Mitchell, MD;  Location: [place];  Service: Vascular;  Laterality: Left;   SIMPLE MASTECTOMY WITH AXILLARY SENTINEL NODE BIOPSY Bilateral 08/22/2021   Procedure: BILATERAL SIMPLE MASTECTOMY;  Surgeon: Erroll Luna, MD;  Location: [place];  Service: General;  Laterality: Bilateral;   TUBAL LIGATION      Current Medications: Current Outpatient Medications on File Prior to Visit  Medication Sig   Accu-Chek Softclix Lancets lancets Use as instructed (Patient taking differently: 1 each by Other route See admin instructions. Use as instructed)   acetaminophen (TYLENOL) 500 MG tablet Take 500 mg by mouth every 6 (six) hours as needed for mild pain, fever or headache.   aspirin EC 81 MG EC tablet Take 1 tablet (81 mg total) by mouth daily.   Blood Glucose Monitoring Suppl (ACCU-CHEK AVIVA CONNECT) w/Device KIT Check sugar 1x daily (Patient taking differently: 1 each by Other route See admin instructions. Check sugar 1x daily)   carvedilol (COREG) 25 MG tablet Take 25 mg by mouth 2 (two) times daily with a meal.   cetirizine (ZYRTEC) 10 MG tablet Take 10 mg by mouth daily.   cholecalciferol (VITAMIN D3) 25 MCG (1000 UNIT) tablet Take 1,000 Units by mouth daily.   cyanocobalamin 1000 MCG tablet Take 1,000 mcg by mouth daily.   escitalopram (LEXAPRO) 10 MG tablet Take 10 mg by mouth daily.   exemestane (AROMASIN) 25 MG tablet TAKE 1 TABLET(25 MG) BY MOUTH DAILY AFTER BREAKFAST   furosemide (LASIX) 40 MG tablet Take 40 mg by mouth 2 (two) times daily as needed for fluid.   gabapentin (NEURONTIN) 300 MG capsule Take 1 capsule (300 mg total) by mouth 2 (two) times daily. Start at 1 cap once daily, and increase to  twice daily if she tolerates. Continue 660m at night (Patient taking differently: Take 300-600 mg by mouth See admin instructions. Take 300 mg by mouth in the morning and 600 mg at night)   glucose blood (ACCU-CHEK AVIVA PLUS) test strip Check sugar 1x daily (Patient taking differently: 1 each by Other route See admin instructions. Check sugar 1x daily)   insulin glargine (LANTUS SOLOSTAR) 100 UNIT/ML Solostar Pen Inject 25 Units into the skin 2 (two) times daily.   Insulin Pen Needle (PEN NEEDLES) 30G X 8 MM MISC 1 each by Does not apply route daily. E11.9   ketoconazole (NIZORAL) 2 % shampoo Apply 1 Application topically 2 (two) times a week.   magnesium oxide (MAG-OX) 400 MG tablet Take 400 mg by mouth 2 (two) times daily.   mycophenolate (MYFORTIC) 180 MG EC tablet Take 360 mg by mouth 2 (two) times  daily.   NOVOLOG FLEXPEN 100 UNIT/ML FlexPen Inject 5 Units into the skin 3 (three) times daily as needed (blood sugar of 200 or above).   oxyCODONE (OXY IR/ROXICODONE) 5 MG immediate release tablet Take 1 tablet (5 mg total) by mouth every 6 (six) hours as needed for severe pain. For AFTER surgery only, do not take and drive   pantoprazole (PROTONIX) 40 MG tablet Take 40 mg by mouth 2 (two) times daily.   predniSONE (DELTASONE) 5 MG tablet Take 5 mg by mouth daily.    rosuvastatin (CRESTOR) 5 MG tablet Take 5 mg by mouth at bedtime.   senna-docusate (SENOKOT-S) 8.6-50 MG tablet Take 2 tablets by mouth at bedtime. For AFTER surgery, do not take if having diarrhea   tacrolimus (PROGRAF) 1 MG capsule Take 3 mg by mouth daily.   tirzepatide [facility]) 7.5 MG/0.5ML Pen Inject 7.5 mg into the skin every Thursday.   No current facility-administered medications on file prior to visit.     Allergies:   Docetaxel, Norvasc [amlodipine], and Tape   Social History   Tobacco Use   Smoking status: Never   Smokeless tobacco: Never  Vaping Use   Vaping Use: Never used  Substance Use Topics   Alcohol  use: Not Currently    Comment: Socially   Drug use: No    Family History: family history includes Asthma in her sister; Cancer (age of onset: 55) in her maternal grandmother; Diabetes in her brother, brother, brother, brother, father, maternal grandfather, mother, paternal grandmother, and sister; Heart disease in her father; Hypertension in her father; Kidney disease in her father and mother; Lupus in her sister; Ovarian cancer in her maternal grandmother. There is no history of Colon cancer, Colon polyps, Esophageal cancer, Rectal cancer, Stomach cancer, Breast cancer, Endometrial cancer, Pancreatic cancer, or Prostate cancer.  ROS:   Please see the history of present illness.   (+) Occasional dizziness (+) Occasional black spots in vision  Additional pertinent ROS otherwise unremarkable.   EKGs/Labs/Other Studies Reviewed:    The following studies were reviewed today:  Per [place] records:  Echo 11/28/20--[place] -LVEF normal, mild LVH, EF 60-65% -mild LAE, normal RA -RV normal size/function. RVSP 39 mmHg -mild to moderate MR. Normal appearing valve -trace TR -no effusion or intracardiac mass, normal aorta/arch  Nuclear stress test 11/24/20 ([facility])--[place] -normal ECG at rest and stress -both rest and stress images normal -LVEF 63% with normal wall motion -no evidence of ischemia  Cath 03/04/2019 Prox LAD to Mid LAD lesion is 10% stenosed. Prox Cx to Mid Cx lesion is 20% stenosed. 1st Mrg lesion is 20% stenosed. Prox RCA to Mid RCA lesion is 10% stenosed. The left ventricular systolic function is normal. LV end diastolic pressure is normal. The left ventricular ejection fraction is 55-65% by visual estimate.   1. Minor nonobstructive CAD 2. Normal LV function 3. Normal LVEDP  Echo 01/06/2015 - Left ventricle: The cavity size was normal. Wall thickness was    increased in a pattern of mild LVH. Systolic function was normal.    The estimated ejection fraction  was in the range of 55% to 60%.    Wall motion was normal; there were no regional wall motion    abnormalities.  - Mitral valve: Calcified annulus. There was mild regurgitation.  - Atrial septum: No defect or patent foramen ovale was identified.   EKG:  EKG is personally reviewed.   03/05/22: not ordered today  08/08/21: NSR at 76 bpm 03/01/21  NSR, LAFB, t wave inversion I, aVL  Recent Labs: 07/05/2021: Magnesium 1.9 02/08/2022: ALT 13; BUN 46; Creatinine 2.13; Hemoglobin 11.9; Platelet Count 161; Potassium 4.3; Sodium 140  Recent Lipid Panel    Component Value Date/Time   CHOL 162 03/29/2014 0616   TRIG 284 (H) 03/29/2014 0616   HDL 31 (L) 03/29/2014 0616   CHOLHDL 5.2 03/29/2014 0616   VLDL 57 (H) 03/29/2014 0616   LDLCALC 74 03/29/2014 0616    Physical Exam:    VS:  BP 134/60 (BP Location: Right Arm, Patient Position: Sitting, Cuff Size: Large)   Pulse 74   Ht '5\' 1"'  (1.549 m)   Wt 185 lb 3.2 oz (84 kg)   SpO2 97%   BMI 34.99 kg/m     Wt Readings from Last 3 Encounters:  03/05/22 185 lb 3.2 oz (84 kg)  02/08/22 185 lb 1.6 oz (84 kg)  11/15/21 187 lb 3.2 oz (84.9 kg)    GEN: Well nourished, well developed in no acute distress HEENT: Normal, moist mucous membranes NECK: No JVD CARDIAC: regular rhythm, normal S1 and S2, no rubs or gallops. There is a prominent murmur from her fistula, and I do not appreciate a separate MR murmur VASCULAR: Radial and DP pulses 2+ bilaterally. No carotid bruits RESPIRATORY:  Clear to auscultation without rales, wheezing or rhonchi  ABDOMEN: Soft, non-tender, non-distended MUSCULOSKELETAL:  Ambulates independently SKIN: Warm and dry, trace bilateral edema NEUROLOGIC:  Alert and oriented x 3. No focal neuro deficits noted. PSYCHIATRIC:  Normal affect    ASSESSMENT:    1. Mitral valve insufficiency, unspecified etiology   2. Type 1 diabetes mellitus with other kidney complication (HCC)   3. Kidney transplant status   4.  Hypercholesterolemia   5. Primary hypertension   6. Aortic atherosclerosis (HCC)    PLAN:    mitral regurgitation -had echo, stress test done with Dr. Claudie Leach in [facility]. Records above. Noted mild-moderate MR -has loud bruit/murmur from fistula, but did not appreciate separate MR murmur today -appears euvolemic today  Type I diabetes, with kidney complications, now s/p kidney transplant -follows with Dr. Joelyn Oms at [facility] transplant nephrology  Hyperlipidemia with diabetes Aortic atherosclerosis noted on CT 06/01/2019 -on aspirin 81 mg, rosuvastatin -she is on mounjaro for diabetes, with benefit of potential weight loss  Hypertension: near goal today, with intermittent lows at home -continue carvedilol. If hypotension at home occurs more frequently, may need to decrease carvedilol. Suspect she may have some autonomic dysfunction as well given long history of type I diabetes. -furosemide is PRN  Cardiac risk counseling and prevention recommendations: -recommend heart healthy/Mediterranean diet, with whole grains, fruits, vegetable, fish, lean meats, nuts, and olive oil. Limit salt. -recommend moderate walking, 3-5 times/week for 30-50 minutes each session. Aim for at least 150 minutes.week. Goal should be pace of 3 miles/hours, or walking 1.5 miles in 30 minutes -recommend avoidance of tobacco products. Avoid excess alcohol. -ASCVD risk score: The 10-year ASCVD risk score (Arnett DK, et al., 2019) is: 4%   Values used to calculate the score:     Age: 16 years     Sex: Female     Is Non-Hispanic African American: No     Diabetic: Yes     Tobacco smoker: No     Systolic Blood Pressure: 591 mmHg     Is BP treated: Yes     HDL Cholesterol: 67 mg/dL     Total Cholesterol: 248 mg/dL  Plan for follow up: 6 months.   Buford Dresser, MD, PhD, [facility]    Medication Adjustments/Labs and Tests Ordered: Current medicines are  reviewed at length with the patient today.  Concerns regarding medicines are outlined above.  No orders of the defined types were placed in this encounter.  No orders of the defined types were placed in this encounter.  Patient Instructions  Medication Instructions:  Your physician recommends that you continue on your current medications as directed. Please refer to the Current Medication list given to you today.   *If you need a refill on your cardiac medications before your next appointment, please call your pharmacy*  Lab Work: NONE  Testing/Procedures: NONE  Follow-Up: At [facility], you and your health needs are our priority.  As part of our continuing mission to provide you with exceptional heart care, we have created designated Provider Care Teams.  These Care Teams include your primary Cardiologist (physician) and Advanced Practice Providers (APPs -  Physician Assistants and Nurse Practitioners) who all work together to provide you with the care you need, when you need it.  We recommend signing up for the patient portal called "MyChart".  Sign up information is provided on this After Visit Summary.  MyChart is used to connect with patients for Virtual Visits (Telemedicine).  Patients are able to view lab/test results, encounter notes, upcoming appointments, etc.  Non-urgent messages can be sent to your provider as well.   To learn more about what you can do with MyChart, go to [email or web address].    Your next appointment:   6 month(s)  The format for your next appointment:   In Person  Provider:   Buford Dresser, MD                   I,Breanna Adamick,acting as a scribe for Buford Dresser, MD.,have documented all relevant documentation on the behalf of Buford Dresser, MD,as directed by  Buford Dresser, MD while in the presence of Buford Dresser, MD.   I, Buford Dresser, MD, have reviewed all  documentation for this visit. The documentation on 03/05/22 for the exam, diagnosis, procedures, and orders are all accurate and complete.   Signed, Buford Dresser, MD PhD 03/05/2022    [place]

## 2022-03-05 NOTE — Patient Instructions (Signed)
Medication Instructions:  Your physician recommends that you continue on your current medications as directed. Please refer to the Current Medication list given to you today.   *If you need a refill on your cardiac medications before your next appointment, please call your pharmacy*  Lab Work: NONE  Testing/Procedures: NONE   Follow-Up: At [facility], you and your health needs are our priority.  As part of our continuing mission to provide you with exceptional heart care, we have created designated Provider Care Teams.  These Care Teams include your primary Cardiologist (physician) and Advanced Practice Providers (APPs -  Physician Assistants and Nurse Practitioners) who all work together to provide you with the care you need, when you need it.  We recommend signing up for the patient portal called "MyChart".  Sign up information is provided on this After Visit Summary.  MyChart is used to connect with patients for Virtual Visits (Telemedicine).  Patients are able to view lab/test results, encounter notes, upcoming appointments, etc.  Non-urgent messages can be sent to your provider as well.   To learn more about what you can do with MyChart, go to [email or web address].    Your next appointment:   6 month(s)  The format for your next appointment:   In Person  Provider:   Bridgette Christopher, MD             

## 2022-03-25 ENCOUNTER — Encounter (HOSPITAL_BASED_OUTPATIENT_CLINIC_OR_DEPARTMENT_OTHER): Payer: Self-pay | Admitting: Cardiology

## 2022-03-28 ENCOUNTER — Telehealth: Payer: Self-pay

## 2022-03-28 NOTE — Telephone Encounter (Signed)
[facility] ,[phone], and spoke with Whitney. He will fax most recent office note from provider Arthur Holms NP and most recent labs to include Hgb A1C.

## 2022-04-05 ENCOUNTER — Inpatient Hospital Stay: Payer: Medicare HMO | Admitting: Hematology

## 2022-04-11 ENCOUNTER — Ambulatory Visit
Admission: RE | Admit: 2022-04-11 | Discharge: 2022-04-11 | Disposition: A | Discharge status: Home / Self Care | Payer: Medicare HMO | Source: Ambulatory Visit | Attending: Registered Nurse | Admitting: Registered Nurse

## 2022-04-11 ENCOUNTER — Other Ambulatory Visit: Payer: Self-pay | Admitting: Registered Nurse

## 2022-04-11 DIAGNOSIS — M549 Dorsalgia, unspecified: Secondary | ICD-10-CM

## 2022-04-11 DIAGNOSIS — R52 Pain, unspecified: Secondary | ICD-10-CM

## 2022-04-13 ENCOUNTER — Telehealth: Payer: Self-pay

## 2022-04-13 NOTE — Telephone Encounter (Signed)
Per Joylene John NP, I reached out to patient, via [employer] ID# [id number]. Pt aware recent HGA1C was 7.5 which is in normal range to proceed with surgery.   Pt voiced she is still wanting surgery and is scheduled for a follow up with Dr. Berline Lopes on 11/10 @ 2:30. Pt agreed with date/time.

## 2022-04-23 ENCOUNTER — Inpatient Hospital Stay: Payer: Medicare HMO | Attending: Hematology | Admitting: Hematology

## 2022-04-23 ENCOUNTER — Encounter: Payer: Self-pay | Admitting: Hematology

## 2022-04-23 ENCOUNTER — Other Ambulatory Visit: Payer: Self-pay

## 2022-04-23 VITALS — BP 180/76 | HR 72 | Temp 98.7°F | Resp 16 | Ht 61.0 in | Wt 179.4 lb

## 2022-04-23 DIAGNOSIS — I10 Essential (primary) hypertension: Secondary | ICD-10-CM | POA: Insufficient documentation

## 2022-04-23 DIAGNOSIS — C50312 Malignant neoplasm of lower-inner quadrant of left female breast: Secondary | ICD-10-CM | POA: Diagnosis present

## 2022-04-23 DIAGNOSIS — Z7952 Long term (current) use of systemic steroids: Secondary | ICD-10-CM | POA: Insufficient documentation

## 2022-04-23 DIAGNOSIS — Z170 Estrogen receptor positive status [ER+]: Secondary | ICD-10-CM | POA: Insufficient documentation

## 2022-04-23 DIAGNOSIS — Z79899 Other long term (current) drug therapy: Secondary | ICD-10-CM | POA: Diagnosis not present

## 2022-04-23 DIAGNOSIS — Z940 Kidney transplant status: Secondary | ICD-10-CM | POA: Insufficient documentation

## 2022-04-23 DIAGNOSIS — M419 Scoliosis, unspecified: Secondary | ICD-10-CM | POA: Diagnosis not present

## 2022-04-23 DIAGNOSIS — E1140 Type 2 diabetes mellitus with diabetic neuropathy, unspecified: Secondary | ICD-10-CM | POA: Diagnosis not present

## 2022-04-23 MED ORDER — TAMOXIFEN CITRATE 20 MG PO TABS: 20.0000 mg | ORAL_TABLET | Freq: Every day | ORAL | 3 refills | Status: DC

## 2022-04-23 NOTE — Progress Notes (Signed)
[name]   [phone] [phone]   Clinic Follow up Note   Patient Care Team: Arthur Holms, NP as PCP - General (Nurse Practitioner) Buford Dresser, MD as PCP - Cardiology (Cardiology) Rexene Agent, MD as Consulting Physician (Nephrology) Dwana Melena, MD as Referring Physician (Nephrology) Truitt Merle, MD as Consulting Physician (Hematology) Erroll Luna, MD as Consulting Physician (General Surgery) Delice Bison, Charlestine Massed, NP as Nurse Practitioner (Hematology and Oncology) Harmon Pier, RN as Registered Nurse  Date of Service:  04/23/2022  CHIEF COMPLAINT: f/u of left breast cancer, BRCA2+  CURRENT THERAPY:  Exemestane, started 08/2021  ASSESSMENT & PLAN:  Cynthia [name] is a 51 y.o. post partial hysterectomy female with   1. 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 Brantley Stage 03/02/21, surgical path showed IDC and DCIS, grade 2, negative nodes with close but clear margins. Ki-67 of 20%. Oncotype showed high risk. -She began adjuvant TC on 05/02/21 after delayed wound healing. She tolerated C1 poorly with nausea with vomiting and diarrhea. She was able to recover well. She experienced cystitis following C2 and C3. She managed to complete 4 cycles, but she has had a slow recovery. -she started exemestane in 08/2021. She has developed worsening joint pains on exemestane in past 5 months; we will discontinue. I will call in tamoxifen but advised her to start in 2-3 weeks to see how much her joint pain improves off antiestrogens. I reviewed potential AEs srom Tamoxifen and she agrees to proceed.  -she is clinically doing well from a breast cancer standpoint. She tells me about a skin infection she had at the right mastectomy incision site; this likely corresponds to the subcutaneous nodule seen on exam at 10/05/21 visit. Physical exam  was unremarkable today aside from scar tissue to this area. -continue surveillance, no mammogram s/p bilateral mastectomy.   2. Arthralgias: low back, hands, knees, shoulders -she developed weakness and pain/stiffness in her hands after finishing chemo.  -she notes she is now seeing a chiropractor for scoliosis. She is wearing a brace. -she reports these pains have worsened in the last 5 months. We will discontinue exemestane and move to tamoxifen in several weeks.  3. 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   4. BRCA2 + pathogenic mutation  -Found on BRCA1/2 analysis and [facility] genetics panel from 02/22/21 -We previously reviewed the increased risk of recurrent breast and or contralateral breast cancer, ovarian cancer, pancreatic cancer, and melanoma -she underwent bilateral mastectomies on 08/22/21 by Dr. Brantley Stage, path was negative. She is currently looking into reconstruction. -she is planning for BSO (s/p partial hysterectomy in 2001) in the near future. This was delayed secondary to elevated A1c, which is now in safe range. -her 3 daughters have been tested and all are negative.     PLAN: -hold exemestane now, see if joint pains improve -start tamoxifen in 2-3 weeks -f/u with transplant team in 07/2022  -f/u with Dr. Berline Lopes 11/10 -f/u with NP Mendel Ryder in 4 months (she will do lab in Feb with her nephrologist)    No problem-specific Assessment & Plan notes found for this encounter.   SUMMARY OF ONCOLOGIC HISTORY: Oncology History Overview Note  Cancer Staging Malignant neoplasm of lower-inner quadrant of left breast in  female, estrogen receptor positive ([place]) Staging form: Breast, AJCC 8th Edition - Clinical stage from 02/09/2021: Stage IA (cT1c, cN0, cM0, G2, ER+, PR+, HER2-) - Signed by  Truitt Merle, MD on 02/20/2021 Stage prefix: Initial diagnosis Histologic grading system: 3 grade system - Pathologic stage from 03/02/2021: Stage IA (pT2, pN0, cM0, G2, ER+, PR+, HER2-, Oncotype DX score: 26) - Signed by Truitt Merle, MD on 03/21/2021 Stage prefix: Initial diagnosis Multigene prognostic tests performed: Oncotype DX Recurrence score range: Greater than or equal to 11 Histologic grading system: 3 grade system Residual tumor (R): R0 - None    Malignant neoplasm of lower-inner quadrant of left breast in female, estrogen receptor positive ([place])  02/07/2021 Mammogram   Exam: Left Diagnostic Mammogram; Left Breast Ultrasound  IMPRESSION: Irregular mass in left breast at 6:30, measuring 1.6 cm by mammogram, is highly suggestive of malignancy. Left axillary ultrasound is negative for lymphadenopathy.   02/09/2021 Cancer Staging   Staging form: Breast, AJCC 8th Edition - Clinical stage from 02/09/2021: Stage IA (cT1c, cN0, cM0, G2, ER+, PR+, HER2-) - Signed by Truitt Merle, MD on 02/20/2021 Stage prefix: Initial diagnosis Histologic grading system: 3 grade system   02/09/2021 Pathology Results   Diagnosis Breast, left, needle core biopsy, 6:30 o'clock 8cm fn - INVASIVE DUCTAL CARCINOMA WITH HISTIOCYTOID FEATURES. SEE NOTE Diagnosis Note Carcinoma measures 1 cm in greatest linear dimension and appears grade 2.  PROGNOSTIC INDICATORS Results: IMMUNOHISTOCHEMICAL AND MORPHOMETRIC ANALYSIS PERFORMED MANUALLY The tumor cells are EQUIVOCAL for Her2 (2+). Her2 by FISH will be performed and results performed separately. Estrogen Receptor: >95%, POSITIVE, STRONG STAINING INTENSITY Progesterone Receptor: 90%, POSITIVE, STRONG STAINING INTENSITY Proliferation Marker Ki67: 20%  FLUORESCENCE IN-SITU HYBRIDIZATION Results: GROUP 5: HER2 **NEGATIVE** Equivocal form of amplification of the HER2 gene was detected in the IHC 2+ tissue sample received from this individual. HER2 FISH was performed by a  technologist and cell imaging and analysis on the [employer]. RATIO OF HER2/CEN17 SIGNALS 1.30 AVERAGE HER2 COPY NUMBER PER CELL 1.75   02/15/2021 Initial Diagnosis   Malignant neoplasm of lower-inner quadrant of left breast in female, estrogen receptor positive ([place])   03/01/2021 Genetic Testing   Positive genetic testing: pathogenic variant detected in BRCA2 at [id number].  No other pathogenic variants detected in [facility] Panel.  The report date is 03/19/2021.  The CustomNext-Cancer+RNAinsight panel offered by [facility] includes sequencing and rearrangement analysis for the following 47 genes:  APC, ATM, AXIN2, BARD1, BMPR1A, BRCA1, BRCA2, BRIP1, CDH1, CDK4, CDKN2A, CHEK2, DICER1, EPCAM, GREM1, HOXB13, MEN1, MLH1, MSH2, MSH3, MSH6, MUTYH, NBN, NF1, NF2, NTHL1, PALB2, PMS2, POLD1, POLE, PTEN, RAD51C, RAD51D, RECQL, RET, SDHA, SDHAF2, SDHB, SDHC, SDHD, SMAD4, SMARCA4, STK11, TP53, TSC1, TSC2, and VHL.  RNA data is routinely analyzed for use in variant interpretation for all genes.   03/02/2021 Cancer Staging   Staging form: Breast, AJCC 8th Edition - Pathologic stage from 03/02/2021: Stage IA (pT2, pN0, cM0, G2, ER+, PR+, HER2-, Oncotype DX score: 26) - Signed by Truitt Merle, MD on 03/21/2021 Stage prefix: Initial diagnosis Multigene prognostic tests performed: Oncotype DX Recurrence score range: Greater than or equal to 11 Histologic grading system: 3 grade system Residual tumor (R): R0 - None   05/02/2021 - 07/12/2021 Chemotherapy   Patient is on Treatment Plan : BREAST TC q21d      08/22/2021 Pathology Results   FINAL MICROSCOPIC DIAGNOSIS:   A. BREAST, LEFT, MASTECTOMY:  - Fibrocystic changes.  - Lumpectomy scar with hemosiderin deposition.  - No malignancy  identified.   B. BREAST, RIGHT, MASTECTOMY:  - Fibrocystic changes with focal fibroadenomatoid change.  - No malignancy identified.       INTERVAL HISTORY:  Cynthia [name] is here for a follow up of breast cancer.  She was last seen by me on 10/05/21, with survivorship in the interim. She presents to the clinic alone. STRATUS interpreter used. She reports she is doing well overall but reports some pain to her breasts. She describes it as a pinch to both breasts that only lasts a few seconds. She reports hot flashes and chills, which she feels are manageable. She also reports joint pain in her hands, lower back, hips, and knees. She notes she is going to a chiropractor for her back due to being found to have some scoliosis. She rates the back pain at 8/10 and the hand and knee pains at 6/10. She explains she had the back pain before, but it worsened with exemestane.   All other systems were reviewed with the patient and are negative.  MEDICAL HISTORY:  Past Medical History:  Diagnosis Date   Allergy    pollen   Anemia    Anxiety    BRCA2 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 03/01/2021   Breast cancer [facility])    Cataract    surgical repair bilateral   Complication of anesthesia    not herself for 2 days after last surgery (brest lumpectomy)   Coronary artery disease    Depression    ESRD on hemodialysis ([place])    Home HD 5x per week- not on dialysis now had tramsplant 4/17   Family history of ovarian cancer 02/22/2021   GERD (gastroesophageal reflux disease)    Hearing loss 2017   right ear   History of blood transfusion    transfusion reaction   Hyperlipidemia    Hypertension    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 per pt-dr levy also II; Per patient 08/18/21 she is Type 2   Kidney transplant recipient    Neuromuscular disorder ([place])    NEUROPATHY   Sleep apnea     SURGICAL HISTORY: Past Surgical History:  Procedure Laterality Date   ABDOMINAL HYSTERECTOMY     BREAST EXCISIONAL BIOPSY Right 08/2015   BREAST LUMPECTOMY WITH RADIOACTIVE SEED LOCALIZATION Right 09/14/2015   Procedure: RIGHT BREAST LUMPECTOMY WITH RADIOACTIVE SEED  LOCALIZATION;  Surgeon: Donnie Mesa, MD;  Location: [place];  Service: General;  Laterality: Right;   BREAST LUMPECTOMY WITH RADIOACTIVE SEED LOCALIZATION Left 03/02/2021   Procedure: LEFT BREAST SEED LUMPECTOMY LEFT SENTINEL LYMPH NODE [place];  Surgeon: Erroll Luna, MD;  Location: [place];  Service: General;  Laterality: Left;  GEN AND PEC BLOCK   BREAST SURGERY Bilateral    biopsy bilateral   CARDIAC CATHETERIZATION     CATARACT EXTRACTION Bilateral    bilateral   CHOLECYSTECTOMY     CYST REMOVAL NECK     DIALYSIS FISTULA CREATION Left    EYE SURGERY Bilateral    lazer   kidney transplant     LEFT HEART CATH AND CORONARY ANGIOGRAPHY N/A 03/04/2019   Procedure: LEFT HEART CATH AND CORONARY ANGIOGRAPHY;  Surgeon: Martinique, Peter M, MD;  Location: [place] CV LAB;  Service: Cardiovascular;  Laterality: N/A;   LEFT HEART CATHETERIZATION WITH CORONARY ANGIOGRAM N/A 03/30/2014   Procedure: LEFT HEART CATHETERIZATION WITH CORONARY ANGIOGRAM;  Surgeon: Sinclair Grooms, MD;  [facility] C[facility];  Service: Cardiovascular;  Laterality: N/A;   LIPOMA EXCISION N/A 02/06/2017   Procedure: EXCISION POSTERIOR NECK SEBACEOUS CYST;  Surgeon: Coralie Keens, MD;  Location: [place];  Service: General;  Laterality: N/A;   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   Procedure: REPAIR OF ARTERIOVENOUS FISTULA ANEURYSM;  Surgeon: Serafina Mitchell, MD;  Location: [facility] OR;  Service: Vascular;  Laterality: Left;   REVISON OF ARTERIOVENOUS FISTULA Left 09/28/2013   Procedure: EXCISE ESCHAR LEFT ARM  ARTERIOVENOUS FISTULA WITH PLICATION OF LEFT ARM ARTERIOVENOUS FISTULA;  Surgeon: Elam Dutch, MD;  Location: [place];  Service: Vascular;  Laterality: Left;   REVISON OF ARTERIOVENOUS FISTULA Left 08/26/2015   Procedure: RESECTION ANEURYSM OF LEFT ARM ARTERIOVENOUS FISTULA  ;  Surgeon: Serafina Mitchell, MD;  Location: [place];  Service: Vascular;   Laterality: Left;   SIMPLE MASTECTOMY WITH AXILLARY SENTINEL NODE BIOPSY Bilateral 08/22/2021   Procedure: BILATERAL SIMPLE MASTECTOMY;  Surgeon: Erroll Luna, MD;  Location: [place];  Service: General;  Laterality: Bilateral;   TUBAL LIGATION      I have reviewed the social history and family history with the patient and they are unchanged from previous note.  ALLERGIES:  is allergic to docetaxel, norvasc [amlodipine], and tape.  MEDICATIONS:  Current Outpatient Medications  Medication Sig Dispense Refill   tamoxifen (NOLVADEX) 20 MG tablet Take 1 tablet (20 mg total) by mouth daily. 30 tablet 3   Accu-Chek Softclix Lancets lancets Use as instructed (Patient taking differently: 1 each by Other route See admin instructions. Use as instructed) 100 each 12   acetaminophen (TYLENOL) 500 MG tablet Take 500 mg by mouth every 6 (six) hours as needed for mild pain, fever or headache.     aspirin EC 81 MG EC tablet Take 1 tablet (81 mg total) by mouth daily. 90 tablet 3   Blood Glucose Monitoring Suppl (ACCU-CHEK AVIVA CONNECT) w/Device KIT Check sugar 1x daily (Patient taking differently: 1 each by Other route See admin instructions. Check sugar 1x daily) 1 kit 0   carvedilol (COREG) 25 MG tablet Take 25 mg by mouth 2 (two) times daily with a meal.     cetirizine (ZYRTEC) 10 MG tablet Take 10 mg by mouth daily.     cholecalciferol (VITAMIN D3) 25 MCG (1000 UNIT) tablet Take 1,000 Units by mouth daily.     cyanocobalamin 1000 MCG tablet Take 1,000 mcg by mouth daily.     escitalopram (LEXAPRO) 10 MG tablet Take 10 mg by mouth daily.     furosemide (LASIX) 40 MG tablet Take 40 mg by mouth 2 (two) times daily as needed for fluid.     gabapentin (NEURONTIN) 300 MG capsule Take 1 capsule (300 mg total) by mouth 2 (two) times daily. Start at 1 cap once daily, and increase to twice daily if she tolerates. Continue 65m at night (Patient taking differently: Take 300-600 mg by mouth See admin instructions.  Take 300 mg by mouth in the morning and 600 mg at night) 60 capsule 0   glucose blood (ACCU-CHEK AVIVA PLUS) test strip Check sugar 1x daily (Patient taking differently: 1 each by Other route See admin instructions. Check sugar 1x daily) 100 each 12   insulin glargine (LANTUS SOLOSTAR) 100 UNIT/ML Solostar Pen Inject into the skin. Take 25 units at bedtime and 30 units in the morning     Insulin Pen Needle (PEN NEEDLES) 30G X 8 MM MISC 1 each by Does not apply route daily. E11.9  90 each 0   ketoconazole (NIZORAL) 2 % shampoo Apply 1 Application topically 2 (two) times a week. 240 mL 0   magnesium oxide (MAG-OX) 400 MG tablet Take 400 mg by mouth 2 (two) times daily.     mycophenolate (MYFORTIC) 180 MG EC tablet Take 360 mg by mouth 2 (two) times daily.     NOVOLOG FLEXPEN 100 UNIT/ML FlexPen Inject 5 Units into the skin 3 (three) times daily as needed (blood sugar of 200 or above).     oxyCODONE (OXY IR/ROXICODONE) 5 MG immediate release tablet Take 1 tablet (5 mg total) by mouth every 6 (six) hours as needed for severe pain. For AFTER surgery only, do not take and drive 15 tablet 0   pantoprazole (PROTONIX) 40 MG tablet Take 40 mg by mouth 2 (two) times daily.  5   predniSONE (DELTASONE) 5 MG tablet Take 5 mg by mouth daily.   2   rosuvastatin (CRESTOR) 5 MG tablet Take 5 mg by mouth at bedtime.     senna-docusate (SENOKOT-S) 8.6-50 MG tablet Take 2 tablets by mouth at bedtime. For AFTER surgery, do not take if having diarrhea 30 tablet 0   tacrolimus ER (ENVARSUS XR) 1 MG TB24 Take 4 tablets by mouth daily.     tirzepatide (MOUNJARO) 12.5 MG/0.5ML Pen Inject 12.5 mg into the skin once a week.     No current facility-administered medications for this visit.    PHYSICAL EXAMINATION: ECOG PERFORMANCE STATUS: 1 - Symptomatic but completely ambulatory  Vitals:   04/23/22 0859  BP: (!) 180/76  Pulse: 72  Resp: 16  Temp: 98.7 F (37.1 C)  SpO2: 98%   Wt Readings from Last 3 Encounters:   04/23/22 179 lb 6.4 oz (81.4 kg)  03/05/22 185 lb 3.2 oz (84 kg)  02/08/22 185 lb 1.6 oz (84 kg)     GENERAL:alert, no distress and comfortable SKIN: skin color, texture, turgor are normal, no rashes or significant lesions EYES: normal, Conjunctiva are pink and non-injected, sclera clear  NECK: supple, thyroid normal size, non-tender, without nodularity LYMPH:  no palpable lymphadenopathy in the cervical, axillary LUNGS: clear to auscultation and percussion with normal breathing effort HEART: regular rate & rhythm and no murmurs and no lower extremity edema ABDOMEN:abdomen soft, non-tender and normal bowel sounds Musculoskeletal:no cyanosis of digits and no clubbing  NEURO: alert & oriented x 3 with fluent speech, no focal motor/sensory deficits BREAST: No palpable mass, nodules or adenopathy bilaterally. Breast exam benign.   LABORATORY DATA:  I have reviewed the data as listed    Latest Ref Rng & Units 02/08/2022    9:50 AM 11/15/2021    2:04 PM 10/05/2021    9:04 AM  CBC  WBC 4.0 - 10.5 K/uL 5.7  5.7  5.7   Hemoglobin 12.0 - 15.0 g/dL 11.9  12.3  12.0   Hematocrit 36.0 - 46.0 % 35.9  38.3  37.0   Platelets 150 - 400 K/uL 161  166  182         Latest Ref Rng & Units 02/08/2022    9:50 AM 11/15/2021    2:04 PM 10/05/2021    9:04 AM  CMP  Glucose 70 - 99 mg/dL 113  136  167   BUN 6 - 20 mg/dL 46  54  36   Creatinine 0.44 - 1.00 mg/dL 2.13  2.07  1.59   Sodium 135 - 145 mmol/L 140  139  141   Potassium 3.5 -  5.1 mmol/L 4.3  4.3  5.0   Chloride 98 - 111 mmol/L 101  105  109   CO2 22 - 32 mmol/L 34  26  28   Calcium 8.9 - 10.3 mg/dL 10.6  10.4  10.4   Total Protein 6.5 - 8.1 g/dL 6.9  7.1  6.9   Total Bilirubin 0.3 - 1.2 mg/dL 0.5  0.5  0.2   Alkaline Phos 38 - 126 U/L 64  63  73   AST 15 - 41 U/L _0 ALT 0 - 44 U/L _1 RADIOGRAPHIC STUDIES: I have personally reviewed the radiological images as listed and agreed with the findings in the  report. No results found.    No orders of the defined types were placed in this encounter.  All questions were answered. The patient knows to call the clinic with any problems, questions or concerns. No barriers to learning was detected. The total time spent in the appointment was 30 minutes.     Truitt Merle, MD 04/23/2022   I, Wilburn Mylar, am acting as scribe for Truitt Merle, MD.   I have reviewed the above documentation for accuracy and completeness, and I agree with the above.

## 2022-05-01 ENCOUNTER — Ambulatory Visit (INDEPENDENT_AMBULATORY_CARE_PROVIDER_SITE_OTHER): Payer: Medicare HMO | Admitting: Orthopaedic Surgery

## 2022-05-01 DIAGNOSIS — M5441 Lumbago with sciatica, right side: Secondary | ICD-10-CM

## 2022-05-01 DIAGNOSIS — G8929 Other chronic pain: Secondary | ICD-10-CM

## 2022-05-01 MED ORDER — METHOCARBAMOL 750 MG PO TABS: 750.0000 mg | ORAL_TABLET | Freq: Two times a day (BID) | ORAL | 0 refills | Status: DC | PRN

## 2022-05-01 NOTE — Progress Notes (Signed)
Office Visit Note   Patient: Cynthia [name]           Date of Birth: 03/27/1971           MRN: [id number] Visit Date: 05/01/2022              Requested by: Arthur Holms, NP [place],  [place] [id number] PCP: Arthur Holms, NP   Assessment & Plan: Visit Diagnoses:  1. Chronic right-sided low back pain with right-sided sciatica     Plan: Impression is chronic right-sided low back pain with right lower extremity radiculopathy.  At this point, the patient is already on chronic steroids I do not want to add to this.  I would like to go ahead and start her on a muscle relaxer and send her to outpatient physical therapy.  Referral has been made.  She is agreeable to this plan.  She will follow-up with [place] as needed.  Call with concerns or questions.  Follow-Up Instructions: Return if symptoms worsen or fail to improve.   Orders:  Orders Placed This Encounter  Procedures   Ambulatory referral to Physical Therapy   Meds ordered this encounter  Medications   methocarbamol (ROBAXIN-750) 750 MG tablet    Sig: Take 1 tablet (750 mg total) by mouth 2 (two) times daily as needed for muscle spasms.    Dispense:  20 tablet    Refill:  0      Procedures: No procedures performed   Clinical Data: No additional findings.   Subjective: Chief Complaint  Patient presents with   Right Hip - Pain    HPI patient is a pleasant 51 year old Spanish-speaking female who is here today with chronic right-sided low back pain and right lower extremity radiculopathy.  This is been ongoing for the past 1 month.  She denies any injury or change in activity.  Her symptoms have not worsened but have remained the same.  The pain she has primarily occurs when she is from a sitting or lying position to standing as well as when she is bending over to pick something up off the floor.  She has been taking Tylenol and most recently oxycodone without relief.  Of note, she is on chronic prednisone following  renal transplant 6 years ago.  She complains of occasional paresthesias to the right foot.  No previous history of lumbar or hip pathology.  Review of Systems as detailed in HPI.  All others reviewed and are negative.   Objective: Vital Signs: There were no vitals taken for this visit.  Physical Exam well-developed well-nourished female no acute distress.  Alert and oriented x3.  Ortho Exam lumbar spine exam reveals no spinous tenderness.  She does have tenderness to the paraspinous musculature on the right lower lumbar spine.  Increased pain with lumbar flexion and extension.  Positive straight leg raise.  Negative logroll.  No focal weakness.  She is neurovascular intact distally.  Specialty Comments:  No specialty comments available.  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  08/22/2021   Port-A-Cath in place 07/12/2021   Hyponatremia 07/03/2021   UTI (urinary tract infection) 07/02/2021   Leukocytosis 56/38/9373   Complicated UTI (urinary tract infection) 06/13/2021   PICC (peripherally inserted central catheter) in place 06/13/2021   Stage 3a chronic kidney  disease (CKD) ([place]) 05/28/2021   Thrombocytopenia ([place]) 05/28/2021   Aortic atherosclerosis ([place]) 03/07/2021   Hypercholesterolemia 03/07/2021   BRCA2 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 03/01/2021   Genetic testing 03/01/2021   Family history of ovarian cancer 02/22/2021   Malignant neoplasm of lower-inner quadrant of left breast in female, estrogen receptor positive ([place]) 02/15/2021   Foot pain, bilateral 07/28/2020   COVID-19 virus infection 06/16/2019   Insulin dependent type 2 diabetes mellitus ([place]) 06/06/2019   Hyperglycemia 06/02/2019   DKA (diabetic ketoacidoses) 06/02/2019   HTN (hypertension) 03/04/2019    Abnormal nuclear stress test 03/04/2019   Diabetic nephropathy ([place]) 03/03/2019   History of renal transplant 11/18/2015   Diabetic retinopathy associated with type 2 diabetes mellitus ([place]) 04/13/2015   Postprandial vomiting    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   Anemia 01/04/2015   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   Hyperkalemia 11/16/2013   Chronic constipation 07/28/2013   Paresthesias 07/28/2013   Diabetes mellitus type I ([place])    Kidney transplant status 05/28/2013   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/11/2013   Precordial chest pain 02/10/2013   Awaiting organ transplant 09/19/2012   Past Medical History:  Diagnosis Date   Allergy    pollen   Anemia    Anxiety    BRCA2 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 03/01/2021   Breast cancer [facility])    Cataract    surgical repair bilateral   Complication of anesthesia    not herself for 2 days after last surgery (brest lumpectomy)   Coronary artery disease    Depression    ESRD on hemodialysis ([place])    Home HD 5x per week- not on dialysis now had tramsplant 4/17   Family history of ovarian cancer 02/22/2021   GERD (gastroesophageal reflux disease)    Hearing loss 2017   right ear   History of blood transfusion    transfusion reaction   Hyperlipidemia    Hypertension    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 per pt-dr levy also II; Per patient 08/18/21 she is Type 2   Kidney transplant recipient    Neuromuscular disorder ([place])    NEUROPATHY   Sleep apnea     Family History  Problem Relation Age of Onset   Diabetes Mother    Kidney disease Mother    Diabetes Father    Heart disease Father    Kidney disease Father    Hypertension Father    Diabetes Sister    Asthma Sister    Lupus Sister    Diabetes Brother    Diabetes Brother    Diabetes Brother    Diabetes Brother    Cancer Maternal Grandmother 63       ovarian cancer   Ovarian cancer Maternal  Grandmother    Diabetes Maternal Grandfather    Diabetes Paternal Grandmother    Colon cancer Neg Hx    Colon polyps Neg Hx    Esophageal cancer Neg Hx    Rectal cancer Neg Hx    Stomach cancer Neg Hx    Breast cancer Neg Hx    Endometrial cancer Neg Hx    Pancreatic cancer Neg Hx    Prostate cancer Neg Hx     Past Surgical History:  Procedure Laterality Date   ABDOMINAL HYSTERECTOMY     BREAST EXCISIONAL BIOPSY Right 08/2015   BREAST LUMPECTOMY WITH RADIOACTIVE SEED LOCALIZATION Right 09/14/2015   Procedure: RIGHT BREAST LUMPECTOMY WITH RADIOACTIVE SEED  LOCALIZATION;  Surgeon: Donnie Mesa, MD;  Location: [facility];  Service: General;  Laterality: Right;   BREAST LUMPECTOMY WITH RADIOACTIVE SEED LOCALIZATION Left 03/02/2021   Procedure: LEFT BREAST SEED LUMPECTOMY LEFT SENTINEL LYMPH NODE [place];  Surgeon: Erroll Luna, MD;  Location: [place];  Service: General;  Laterality: Left;  GEN AND PEC BLOCK   BREAST SURGERY Bilateral    biopsy bilateral   CARDIAC CATHETERIZATION     CATARACT EXTRACTION Bilateral    bilateral   CHOLECYSTECTOMY     CYST REMOVAL NECK     DIALYSIS FISTULA CREATION Left    EYE SURGERY Bilateral    lazer   kidney transplant     LEFT HEART CATH AND CORONARY ANGIOGRAPHY N/A 03/04/2019   Procedure: LEFT HEART CATH AND CORONARY ANGIOGRAPHY;  Surgeon: Martinique, Peter M, MD;  Location: [facility];  Service: Cardiovascular;  Laterality: N/A;   LEFT HEART CATHETERIZATION WITH CORONARY ANGIOGRAM N/A 03/30/2014   Procedure: LEFT HEART CATHETERIZATION WITH CORONARY ANGIOGRAM;  Surgeon: Sinclair Grooms, MD;  Location: [facility] CATH LAB;  Service: Cardiovascular;  Laterality: N/A;   LIPOMA EXCISION N/A 02/06/2017   Procedure: EXCISION POSTERIOR NECK SEBACEOUS CYST;  Surgeon: Coralie Keens, MD;  Location: [place];  Service: General;  Laterality: N/A;   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   Procedure:  REPAIR OF ARTERIOVENOUS FISTULA ANEURYSM;  Surgeon: Serafina Mitchell, MD;  Location: [facility];  Service: Vascular;  Laterality: Left;   REVISON OF ARTERIOVENOUS FISTULA Left 09/28/2013   Procedure: EXCISE ESCHAR LEFT ARM  ARTERIOVENOUS FISTULA WITH PLICATION OF LEFT ARM ARTERIOVENOUS FISTULA;  Surgeon: Elam Dutch, MD;  Location: [facility];  Service: Vascular;  Laterality: Left;   REVISON OF ARTERIOVENOUS FISTULA Left 08/26/2015   Procedure: RESECTION ANEURYSM OF LEFT ARM ARTERIOVENOUS FISTULA  ;  Surgeon: Serafina Mitchell, MD;  Location: [place];  Service: Vascular;  Laterality: Left;   SIMPLE MASTECTOMY WITH AXILLARY SENTINEL NODE BIOPSY Bilateral 08/22/2021   Procedure: BILATERAL SIMPLE MASTECTOMY;  Surgeon: Erroll Luna, MD;  Location: [place];  Service: General;  Laterality: Bilateral;   TUBAL LIGATION     Social History   Occupational History   Occupation: disabled  Tobacco Use   Smoking status: Never   Smokeless tobacco: Never  Vaping Use   Vaping Use: Never used  Substance and Sexual Activity   Alcohol use: Not Currently    Comment: Socially   Drug use: No   Sexual activity: Not Currently    Birth control/protection: None

## 2022-05-04 ENCOUNTER — Encounter: Payer: Self-pay | Admitting: Gynecologic Oncology

## 2022-05-04 ENCOUNTER — Inpatient Hospital Stay (HOSPITAL_BASED_OUTPATIENT_CLINIC_OR_DEPARTMENT_OTHER): Payer: Medicare HMO | Admitting: Gynecologic Oncology

## 2022-05-04 ENCOUNTER — Inpatient Hospital Stay: Payer: Medicare HMO | Attending: Hematology

## 2022-05-04 VITALS — BP 171/69 | HR 79 | Temp 97.5°F | Resp 19 | Ht 61.0 in | Wt 182.5 lb

## 2022-05-04 DIAGNOSIS — Z1501 Genetic susceptibility to malignant neoplasm of breast: Secondary | ICD-10-CM

## 2022-05-04 DIAGNOSIS — Z940 Kidney transplant status: Secondary | ICD-10-CM | POA: Diagnosis not present

## 2022-05-04 DIAGNOSIS — Z1502 Genetic susceptibility to malignant neoplasm of ovary: Secondary | ICD-10-CM

## 2022-05-04 DIAGNOSIS — E1122 Type 2 diabetes mellitus with diabetic chronic kidney disease: Secondary | ICD-10-CM | POA: Diagnosis not present

## 2022-05-04 DIAGNOSIS — Z6834 Body mass index (BMI) 34.0-34.9, adult: Secondary | ICD-10-CM | POA: Diagnosis not present

## 2022-05-04 DIAGNOSIS — Z1509 Genetic susceptibility to other malignant neoplasm: Secondary | ICD-10-CM | POA: Diagnosis not present

## 2022-05-04 DIAGNOSIS — Z170 Estrogen receptor positive status [ER+]: Secondary | ICD-10-CM | POA: Insufficient documentation

## 2022-05-04 DIAGNOSIS — Z148 Genetic carrier of other disease: Secondary | ICD-10-CM | POA: Diagnosis not present

## 2022-05-04 DIAGNOSIS — C50312 Malignant neoplasm of lower-inner quadrant of left female breast: Secondary | ICD-10-CM | POA: Diagnosis not present

## 2022-05-04 DIAGNOSIS — Z8041 Family history of malignant neoplasm of ovary: Secondary | ICD-10-CM | POA: Diagnosis not present

## 2022-05-04 DIAGNOSIS — E119 Type 2 diabetes mellitus without complications: Secondary | ICD-10-CM

## 2022-05-04 DIAGNOSIS — Z9012 Acquired absence of left breast and nipple: Secondary | ICD-10-CM | POA: Insufficient documentation

## 2022-05-04 DIAGNOSIS — N186 End stage renal disease: Secondary | ICD-10-CM | POA: Diagnosis not present

## 2022-05-04 DIAGNOSIS — Z794 Long term (current) use of insulin: Secondary | ICD-10-CM

## 2022-05-04 NOTE — Progress Notes (Signed)
Gynecologic Oncology Return Clinic Visit  05/04/22  Reason for Visit: Treatment planning in the setting of ER positive breast cancer and BRCA2 mutation  Treatment History: Oncology History Overview Note  Cancer Staging Malignant neoplasm of lower-inner quadrant of left breast in female, estrogen receptor positive ([place]) Staging form: Breast, AJCC 8th Edition - Clinical stage from 02/09/2021: Stage IA (cT1c, cN0, cM0, G2, ER+, PR+, HER2-) - Signed by Truitt Merle, MD on 02/20/2021 Stage prefix: Initial diagnosis Histologic grading system: 3 grade system - Pathologic stage from 03/02/2021: Stage IA (pT2, pN0, cM0, G2, ER+, PR+, HER2-, Oncotype DX score: 26) - Signed by Truitt Merle, MD on 03/21/2021 Stage prefix: Initial diagnosis Multigene prognostic tests performed: Oncotype DX Recurrence score range: Greater than or equal to 11 Histologic grading system: 3 grade system Residual tumor (R): R0 - None    Malignant neoplasm of lower-inner quadrant of left breast in female, estrogen receptor positive ([place])  02/07/2021 Mammogram   Exam: Left Diagnostic Mammogram; Left Breast Ultrasound  IMPRESSION: Irregular mass in left breast at 6:30, measuring 1.6 cm by mammogram, is highly suggestive of malignancy. Left axillary ultrasound is negative for lymphadenopathy.   02/09/2021 Cancer Staging   Staging form: Breast, AJCC 8th Edition - Clinical stage from 02/09/2021: Stage IA (cT1c, cN0, cM0, G2, ER+, PR+, HER2-) - Signed by Truitt Merle, MD on 02/20/2021 Stage prefix: Initial diagnosis Histologic grading system: 3 grade system   02/09/2021 Pathology Results   Diagnosis Breast, left, needle core biopsy, 6:30 o'clock 8cm fn - INVASIVE DUCTAL CARCINOMA WITH HISTIOCYTOID FEATURES. SEE NOTE Diagnosis Note Carcinoma measures 1 cm in greatest linear dimension and appears grade 2.  PROGNOSTIC INDICATORS Results: IMMUNOHISTOCHEMICAL AND MORPHOMETRIC ANALYSIS PERFORMED MANUALLY The tumor cells are EQUIVOCAL for  Her2 (2+). Her2 by FISH will be performed and results performed separately. Estrogen Receptor: >95%, POSITIVE, STRONG STAINING INTENSITY Progesterone Receptor: 90%, POSITIVE, STRONG STAINING INTENSITY Proliferation Marker Ki67: 20%  FLUORESCENCE IN-SITU HYBRIDIZATION Results: GROUP 5: HER2 **NEGATIVE** Equivocal form of amplification of the HER2 gene was detected in the IHC 2+ tissue sample received from this individual. HER2 FISH was performed by a technologist and cell imaging and analysis on the [employer]. RATIO OF HER2/CEN17 SIGNALS 1.30 AVERAGE HER2 COPY NUMBER PER CELL 1.75   02/15/2021 Initial Diagnosis   Malignant neoplasm of lower-inner quadrant of left breast in female, estrogen receptor positive ([place])   03/01/2021 Genetic Testing   Positive genetic testing: pathogenic variant detected in BRCA2 at [id number].  No other pathogenic variants detected in [facility] Custom[facility] Panel.  The report date is 03/19/2021.  The CustomNext-Cancer+RNAinsight panel offered by [facility] includes sequencing and rearrangement analysis for the following 47 genes:  APC, ATM, AXIN2, BARD1, BMPR1A, BRCA1, BRCA2, BRIP1, CDH1, CDK4, CDKN2A, CHEK2, DICER1, EPCAM, GREM1, HOXB13, MEN1, MLH1, MSH2, MSH3, MSH6, MUTYH, NBN, NF1, NF2, NTHL1, PALB2, PMS2, POLD1, POLE, PTEN, RAD51C, RAD51D, RECQL, RET, SDHA, SDHAF2, SDHB, SDHC, SDHD, SMAD4, SMARCA4, STK11, TP53, TSC1, TSC2, and VHL.  RNA data is routinely analyzed for use in variant interpretation for all genes.   03/02/2021 Cancer Staging   Staging form: Breast, AJCC 8th Edition - Pathologic stage from 03/02/2021: Stage IA (pT2, pN0, cM0, G2, ER+, PR+, HER2-, Oncotype DX score: 26) - Signed by Truitt Merle, MD on 03/21/2021 Stage prefix: Initial diagnosis Multigene prognostic tests performed: Oncotype DX Recurrence score range: Greater than or equal to 11 Histologic grading system: 3 grade system Residual tumor (R): R0 - None   05/02/2021 - 07/12/2021 Chemotherapy  Patient is on Treatment Plan : BREAST TC q21d      08/22/2021 Pathology Results   FINAL MICROSCOPIC DIAGNOSIS:   A. BREAST, LEFT, MASTECTOMY:  - Fibrocystic changes.  - Lumpectomy scar with hemosiderin deposition.  - No malignancy identified.   B. BREAST, RIGHT, MASTECTOMY:  - Fibrocystic changes with focal fibroadenomatoid change.  - No malignancy identified.     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 She has completed adjuvant chemotherapy.  She is scheduled to have bilateral mastectomies next week.  Genetic testing revealed that she has a BRCA2 mutation.  From an oncologic standpoint, plan will be for postoperative aromatase inhibitor given menopausal status and ER+ cancer.  Her most recent [facility] was consistent with menopause (76.9).   Patient reports overall doing well.  She notes appetite has been up and down.  She has gained some weight recently.  She has about a week of abdominal symptoms including intermittent bloating and feeling gassy as well as nausea.  She describes some baseline epigastric abdominal pain which has been worse for the last week, describes intermittent radiation to the right side of her upper abdomen.  Notes being constipated at baseline, no recent change.  Has intermittent bilateral pelvic pain.  She also endorses symptoms consistent with both stress and urge urinary incontinence.  Patient has been seen by urogynecology, most recently in 2020 for diagnosis of overactive bladder.  At that point, she was started on trospium.   Patient's GYN history is notable for hysterectomy in 2001 which it sounds like was performed for uterine fibroids.  Patient thinks that both fallopian tubes and ovaries were left in situ.  Patient remembers having menopausal symptoms around her mid 48s.  Patient thinks she may have had an abnormal Pap smear about 5 years ago with a repeat normal Pap smear.  The only Pap test that I see in our system is  from 2014.  This Pap was negative, high risk HPV not detected.  Interval History: Patient presents today for follow-up.  She notes overall doing well although is having significant back pain, joint pain, and some right groin pain.  She is starting physical therapy for this soon.  She continues to endorse some constipation, uses Dulcolax with some relief.  Denies any urinary symptoms.  Denies any vaginal bleeding or discharge.  Blood glucoses have been much better controlled, generally running 100-140.  CBG was 66 this morning.  Past Medical/Surgical History: Past Medical History:  Diagnosis Date   Allergy    pollen   Anemia    Anxiety    BRCA2 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 03/01/2021   Breast cancer [facility])    Cataract    surgical repair bilateral   Complication of anesthesia    not herself for 2 days after last surgery (brest lumpectomy)   Coronary artery disease    Depression    ESRD on hemodialysis ([place])    Home HD 5x per week- not on dialysis now had tramsplant 4/17   Family history of ovarian cancer 02/22/2021   GERD (gastroesophageal reflux disease)    Hearing loss 2017   right ear   History of blood transfusion    transfusion reaction   Hyperlipidemia    Hypertension    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 per pt-dr levy also II; Per patient 08/18/21 she is Type 2   Kidney transplant recipient  Neuromuscular disorder ([place])    NEUROPATHY   Sleep apnea     Past Surgical History:  Procedure Laterality Date   ABDOMINAL HYSTERECTOMY     BREAST EXCISIONAL BIOPSY Right 08/2015   BREAST LUMPECTOMY WITH RADIOACTIVE SEED LOCALIZATION Right 09/14/2015   Procedure: RIGHT BREAST LUMPECTOMY WITH RADIOACTIVE SEED LOCALIZATION;  Surgeon: Donnie Mesa, MD;  Location: [place];  Service: General;  Laterality: Right;   BREAST LUMPECTOMY WITH RADIOACTIVE SEED LOCALIZATION Left 03/02/2021   Procedure: LEFT  BREAST SEED LUMPECTOMY LEFT SENTINEL LYMPH NODE [place];  Surgeon: Erroll Luna, MD;  Location: [place];  Service: General;  Laterality: Left;  GEN AND PEC BLOCK   BREAST SURGERY Bilateral    biopsy bilateral   CARDIAC CATHETERIZATION     CATARACT EXTRACTION Bilateral    bilateral   CHOLECYSTECTOMY     CYST REMOVAL NECK     DIALYSIS FISTULA CREATION Left    EYE SURGERY Bilateral    lazer   kidney transplant     LEFT HEART CATH AND CORONARY ANGIOGRAPHY N/A 03/04/2019   Procedure: LEFT HEART CATH AND CORONARY ANGIOGRAPHY;  Surgeon: Martinique, Peter M, MD;  Location: [place] CV LAB;  Service: Cardiovascular;  Laterality: N/A;   LEFT HEART CATHETERIZATION WITH CORONARY ANGIOGRAM N/A 03/30/2014   Procedure: LEFT HEART CATHETERIZATION WITH CORONARY ANGIOGRAM;  Surgeon: Sinclair Grooms, MD;  Location[facility] CATH LAB;  Service: Cardiovascular;  Laterality: N/A;   LIPOMA EXCISION N/A 02/06/2017   Procedure: EXCISION POSTERIOR NECK SEBACEOUS CYST;  Surgeon: Coralie Keens, MD;  Location: [place];  Service: General;  Laterality: N/A;   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   Procedure: REPAIR OF ARTERIOVENOUS FISTULA ANEURYSM;  Surgeon: Serafina Mitchell, MD;  Location: [facility] OR;  Service: Vascular;  Laterality: Left;   REVISON OF ARTERIOVENOUS FISTULA Left 09/28/2013   Procedure: EXCISE ESCHAR LEFT ARM  ARTERIOVENOUS FISTULA WITH PLICATION OF LEFT ARM ARTERIOVENOUS FISTULA;  Surgeon: Elam Dutch, MD;  Location[facility] OR;  Service: Vascular;  Laterality: Left;   REVISON OF ARTERIOVENOUS FISTULA Left 08/26/2015   Procedure: RESECTION ANEURYSM OF LEFT ARM ARTERIOVENOUS FISTULA  ;  Surgeon: Serafina Mitchell, MD;  Location: [place];  Service: Vascular;  Laterality: Left;   SIMPLE MASTECTOMY WITH AXILLARY SENTINEL NODE BIOPSY Bilateral 08/22/2021   Procedure: BILATERAL SIMPLE MASTECTOMY;  Surgeon: Erroll Luna, MD;  Location: [place];  Service: General;  Laterality:  Bilateral;   TUBAL LIGATION      Family History  Problem Relation Age of Onset   Diabetes Mother    Kidney disease Mother    Diabetes Father    Heart disease Father    Kidney disease Father    Hypertension Father    Diabetes Sister    Asthma Sister    Lupus Sister    Diabetes Brother    Diabetes Brother    Diabetes Brother    Diabetes Brother    Cancer Maternal Grandmother 3       ovarian cancer   Ovarian cancer Maternal Grandmother    Diabetes Maternal Grandfather    Diabetes Paternal Grandmother    Colon cancer Neg Hx    Colon polyps Neg Hx    Esophageal cancer Neg Hx    Rectal cancer Neg Hx    Stomach cancer Neg Hx    Breast cancer Neg Hx    Endometrial cancer Neg Hx    Pancreatic cancer Neg Hx    Prostate cancer  Neg Hx     Social History   Socioeconomic History   Marital status: Legally Separated    Spouse name: Not on file   Number of children: 3   Years of education: 9   Highest education level: Not on file  Occupational History   Occupation: disabled  Tobacco Use   Smoking status: Never   Smokeless tobacco: Never  Vaping Use   Vaping Use: Never used  Substance and Sexual Activity   Alcohol use: Not Currently    Comment: Socially   Drug use: No   Sexual activity: Not Currently    Birth control/protection: None  Other Topics Concern   Not on file  Social History Narrative   Lives in home with daughters, grand daughter, son-in-law   Caffeine use - 1 cup coffee sometimes    Social Determinants of Health   Financial Resource Strain: High Risk (02/22/2021)   Overall Financial Resource Strain (CARDIA)    Difficulty of Paying Living Expenses: Hard  Food Insecurity: Food Insecurity Present (02/22/2021)   Hunger Vital Sign    Worried About Running Out of Food in the Last Year: Sometimes true    Ran Out of Food in the Last Year: Sometimes true  Transportation Needs: No Transportation Needs (02/22/2021)   PRAPARE - Radiographer, therapeutic (Medical): No    Lack of Transportation (Non-Medical): No  Physical Activity: Not on file  Stress: Not on file  Social Connections: Not on file    Current Medications:  Current Outpatient Medications:    Accu-Chek Softclix Lancets lancets, Use as instructed (Patient taking differently: 1 each by Other route See admin instructions. Use as instructed), Disp: 100 each, Rfl: 12   acetaminophen (TYLENOL) 500 MG tablet, Take 500 mg by mouth every 6 (six) hours as needed for mild pain, fever or headache., Disp: , Rfl:    aspirin EC 81 MG EC tablet, Take 1 tablet (81 mg total) by mouth daily., Disp: 90 tablet, Rfl: 3   Blood Glucose Monitoring Suppl (ACCU-CHEK AVIVA CONNECT) w/Device KIT, Check sugar 1x daily (Patient taking differently: 1 each by Other route See admin instructions. Check sugar 1x daily), Disp: 1 kit, Rfl: 0   carvedilol (COREG) 25 MG tablet, Take 25 mg by mouth 2 (two) times daily with a meal., Disp: , Rfl:    cetirizine (ZYRTEC) 10 MG tablet, Take 10 mg by mouth daily., Disp: , Rfl:    cholecalciferol (VITAMIN D3) 25 MCG (1000 UNIT) tablet, Take 1,000 Units by mouth daily., Disp: , Rfl:    cyanocobalamin 1000 MCG tablet, Take 1,000 mcg by mouth daily., Disp: , Rfl:    escitalopram (LEXAPRO) 10 MG tablet, Take 10 mg by mouth daily., Disp: , Rfl:    furosemide (LASIX) 40 MG tablet, Take 40 mg by mouth 2 (two) times daily as needed for fluid., Disp: , Rfl:    gabapentin (NEURONTIN) 300 MG capsule, Take 1 capsule (300 mg total) by mouth 2 (two) times daily. Start at 1 cap once daily, and increase to twice daily if she tolerates. Continue 634m at night (Patient taking differently: Take 300-600 mg by mouth See admin instructions. Take 300 mg by mouth in the morning and 600 mg at night), Disp: 60 capsule, Rfl: 0   glucose blood (ACCU-CHEK AVIVA PLUS) test strip, Check sugar 1x daily (Patient taking differently: 1 each by Other route See admin instructions. Check sugar 1x  daily), Disp: 100 each, Rfl: 12   insulin  glargine (LANTUS SOLOSTAR) 100 UNIT/ML Solostar Pen, Inject into the skin. Take 25 units at bedtime and 30 units in the morning, Disp: , Rfl:    Insulin Pen Needle (PEN NEEDLES) 30G X 8 MM MISC, 1 each by Does not apply route daily. E11.9, Disp: 90 each, Rfl: 0   ketoconazole (NIZORAL) 2 % shampoo, Apply 1 Application topically 2 (two) times a week., Disp: 240 mL, Rfl: 0   magnesium oxide (MAG-OX) 400 MG tablet, Take 400 mg by mouth 2 (two) times daily., Disp: , Rfl:    methocarbamol (ROBAXIN-750) 750 MG tablet, Take 1 tablet (750 mg total) by mouth 2 (two) times daily as needed for muscle spasms., Disp: 20 tablet, Rfl: 0   mycophenolate (MYFORTIC) 180 MG EC tablet, Take 360 mg by mouth 2 (two) times daily., Disp: , Rfl:    NOVOLOG FLEXPEN 100 UNIT/ML FlexPen, Inject 5 Units into the skin 3 (three) times daily as needed (blood sugar of 200 or above)., Disp: , Rfl:    oxyCODONE (OXY IR/ROXICODONE) 5 MG immediate release tablet, Take 1 tablet (5 mg total) by mouth every 6 (six) hours as needed for severe pain. For AFTER surgery only, do not take and drive, Disp: 15 tablet, Rfl: 0   pantoprazole (PROTONIX) 40 MG tablet, Take 40 mg by mouth 2 (two) times daily., Disp: , Rfl: 5   predniSONE (DELTASONE) 5 MG tablet, Take 5 mg by mouth daily. , Disp: , Rfl: 2   rosuvastatin (CRESTOR) 5 MG tablet, Take 5 mg by mouth at bedtime., Disp: , Rfl:    senna-docusate (SENOKOT-S) 8.6-50 MG tablet, Take 2 tablets by mouth at bedtime. For AFTER surgery, do not take if having diarrhea, Disp: 30 tablet, Rfl: 0   tacrolimus ER (ENVARSUS XR) 1 MG TB24, Take 4 tablets by mouth daily., Disp: , Rfl:    tamoxifen (NOLVADEX) 20 MG tablet, Take 1 tablet (20 mg total) by mouth daily., Disp: 30 tablet, Rfl: 3   tirzepatide (MOUNJARO) 12.5 MG/0.5ML Pen, Inject 12.5 mg into the skin once a week., Disp: , Rfl:   Review of Systems: + joint and back pain Denies appetite changes, fevers,  chills, fatigue, unexplained weight changes. Denies hearing loss, neck lumps or masses, mouth sores, ringing in ears or voice changes. Denies cough or wheezing.  Denies shortness of breath. Denies chest pain or palpitations. Denies leg swelling. Denies abdominal distention, pain, blood in stools, constipation, diarrhea, nausea, vomiting, or early satiety. Denies pain with intercourse, dysuria, frequency, hematuria or incontinence. Denies hot flashes, pelvic pain, vaginal bleeding or vaginal discharge.   Denies muscle pain/cramps. Denies itching, rash, or wounds. Denies dizziness, headaches, numbness or seizures. Denies swollen lymph nodes or glands, denies easy bruising or bleeding. Denies anxiety, depression, confusion, or decreased concentration.  Physical Exam: BP (!) 171/69 (Patient Position: Sitting)   Pulse 79   Temp (!) 97.5 F (36.4 C) (Oral)   Resp 19   Ht _0  (1.549 m)   Wt 182 lb 8 oz (82.8 kg)   SpO2 100%   BMI 34.48 kg/m  General: Alert, oriented, no acute distress. HEENT: Posterior oropharynx clear, sclera anicteric. Chest: Clear to auscultation bilaterally.  No wheezes or rhonchi. Cardiovascular: Regular rate and rhythm, no murmurs. Abdomen: Obese, soft, nontender.  Normoactive bowel sounds.  No masses or hepatosplenomegaly appreciated.  Well-healed right upper quadrant incision. Extremities: Grossly normal range of motion.  Warm, well perfused.  No edema bilaterally. Skin: No rashes or lesions noted. Lymphatics: No cervical,  supraclavicular, or inguinal adenopathy. GU: Normal appearing external genitalia without erythema, excoriation, or lesions.  Speculum exam reveals cuff is intact, no lesions noted.  On bimanual exam, mild fullness in the cul-de-sac although no definite mass appreciated.  Laboratory & Radiologic Studies:     Component Ref Range & Units 8 mo ago  Cancer Antigen (CA) 125 0.0 - 38.1 U/mL 9.1     09/11/21: Pelvic  ultrasound IMPRESSION: Nonvisualization of the ovaries. No significant sonographic abnormality of the pelvis.  Assessment & Plan: Donnice Nielsen is a 51 y.o. woman with ER+ breast cancer and BRCA2 mutation.  Patient represents to discuss surgery again.  Diabetes is under much better control now.  Hemoglobin A1c at the beginning of October was 7.9%.  The patient would like to schedule risk-reducing surgery in January.  Have previously discussed the risk of ovarian cancer in patients with BRCA 2 mutations.  Her last pelvic ultrasound and CA125 were in March.  Given plan to delay surgery for a couple of months, I have recommended that we obtain both an ultrasound and CA125.  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 We discussed the risk given location of the transplant kidney in her right pelvis. She has had a CT in the last few years with details the anatomy of her transplanted kidney and ureter. I would not proceed with removal of the right adnexa if I thought there was any significant risk of damaging her transplant.    We will reach out to both cardiology and PCP regarding preoperative clearance closer to the date of surgery.   The plan would be for a robotic assisted bilateral salpingo-oophorectomy, possible staging, possible laparotomy. We discussed the small but present risk of incidental cancer at the time of risk-reducing surgery for BRCA mutations.   The patient will return for a preoperative visit with myself and Melissa closer to the date of surgery.  22 minutes of total time was spent for this patient encounter, including preparation, face-to-face counseling with the patient and coordination of care, and documentation of the encounter.  Jeral Pinch, MD  Division of Gynecologic Oncology  Department of Obstetrics and Gynecology  [facility]

## 2022-05-04 NOTE — Patient Instructions (Addendum)
Plan to have a CA 125 lab draw today and an ultrasound coming up soon. We will reach out with the results.  We will see you back in early January for a preoperative visit before your surgery in January on the 17th.  You may also get a phone call from the hospital to arrange for a preop appointment there as well.

## 2022-05-05 LAB — CA 125: Cancer Antigen (CA) 125: 6.6 U/mL (ref 0.0–38.1)

## 2022-05-08 ENCOUNTER — Ambulatory Visit (HOSPITAL_COMMUNITY): Payer: Medicare HMO | Attending: Gynecologic Oncology

## 2022-05-10 ENCOUNTER — Telehealth: Payer: Self-pay | Admitting: *Deleted

## 2022-05-10 NOTE — Telephone Encounter (Signed)
Called and left the patient's daughter a message to call the office regarding her missed scan appt. Appt needs to be rescheduled

## 2022-05-14 ENCOUNTER — Encounter: Payer: Self-pay | Admitting: Nurse Practitioner

## 2022-05-14 ENCOUNTER — Other Ambulatory Visit: Payer: Self-pay | Admitting: Nurse Practitioner

## 2022-05-14 ENCOUNTER — Ambulatory Visit (INDEPENDENT_AMBULATORY_CARE_PROVIDER_SITE_OTHER): Payer: Medicare HMO | Admitting: Nurse Practitioner

## 2022-05-14 VITALS — BP 152/68 | HR 79 | Ht 61.0 in | Wt 177.1 lb

## 2022-05-14 DIAGNOSIS — K219 Gastro-esophageal reflux disease without esophagitis: Secondary | ICD-10-CM

## 2022-05-14 DIAGNOSIS — K5909 Other constipation: Secondary | ICD-10-CM | POA: Diagnosis not present

## 2022-05-14 MED ORDER — FAMOTIDINE 20 MG PO TABS: 20.0000 mg | ORAL_TABLET | Freq: Every day | ORAL | 1 refills | Status: DC

## 2022-05-14 NOTE — Progress Notes (Signed)
05/14/2022 Cynthia [name] [id number] 1970/09/06   Chief Complaint: Acid reflux, bloat, constipation   History of Present Illness: Cynthia [name] is a 51 year old female with a past medical history of anxiety, depression, hypertension, nonobstructive coronary artery disease, breast cancer, 01/2021 s/p left lumpectomy, + BRCA2 mutation s/p bilateral mastectomies 07/2021, diabetes, neuropathy, ESRD on HD s/p renal transplant 09/2015, sleep apnea, anemia, GERD and tubular adenomatous colon polyp. Past partial hysterectomy, and cholecystectomy. She is scheduled for a BSO on 07/11/2022.   She presents today with complaints of increased acid reflux symptoms with constipation and abdominal bloat.  She speaks Spanish therefore [employer] online interpreter utilized throughout today's consult to facilitate communication.  She describes feeling acid which burns up into her esophagus which has improved since she started Pantoprazole 40 mg twice daily one year ago but has not completely abated.  She has acid reflux once weekly and sometimes once every 2 weeks.  She has difficulty swallowing her saliva at times.  She also has dysphagia with dry foods such as bread which usually gets stuck to the upper esophagus and causes a scraping discomfort as it passes down to the stomach.  She avoids spicy and tomato foods.  She takes ASA 81 mg once daily.  She is passing a hard formed stool 3 to 4 days weekly.  She takes a stool softener a few days weekly as needed.  No rectal bleeding.      Latest Ref Rng & Units 02/08/2022    9:50 AM 11/15/2021    2:04 PM 10/05/2021    9:04 AM  CBC  WBC 4.0 - 10.5 K/uL 5.7  5.7  5.7   Hemoglobin 12.0 - 15.0 g/dL 11.9  12.3  12.0   Hematocrit 36.0 - 46.0 % 35.9  38.3  37.0   Platelets 150 - 400 K/uL 161  166  182        Latest Ref Rng & Units 02/08/2022    9:50 AM 11/15/2021    2:04 PM 10/05/2021    9:04 AM  CMP  Glucose 70 - 99 mg/dL 113  136  167   BUN 6 - 20 mg/dL 46  54  36    Creatinine 0.44 - 1.00 mg/dL 2.13  2.07  1.59   Sodium 135 - 145 mmol/L 140  139  141   Potassium 3.5 - 5.1 mmol/L 4.3  4.3  5.0   Chloride 98 - 111 mmol/L 101  105  109   CO2 22 - 32 mmol/L 34  26  28   Calcium 8.9 - 10.3 mg/dL 10.6  10.4  10.4   Total Protein 6.5 - 8.1 g/dL 6.9  7.1  6.9   Total Bilirubin 0.3 - 1.2 mg/dL 0.5  0.5  0.2   Alkaline Phos 38 - 126 U/L 64  63  73   AST 15 - 41 U/L _0 ALT 0 - 44 U/L _1 Echo 11/28/20--[facility] -LVEF normal, mild LVH, EF 60-65% -mild LAE, normal RA -RV normal size/function. RVSP 39 mmHg -mild to moderate MR. Normal appearing valve -trace TR -no effusion or intracardiac mass, normal aorta/arch   Nuclear stress test 11/24/20 ([facility])--[facility] -normal ECG at rest and stress -both rest and stress images normal -LVEF 63% with normal wall motion -no evidence of ischemia   Cath 03/04/2019 Prox LAD to Mid LAD lesion is 10% stenosed. Prox Cx to Mid Cx  lesion is 20% stenosed. 1st Mrg lesion is 20% stenosed. Prox RCA to Mid RCA lesion is 10% stenosed. The left ventricular systolic function is normal. LV end diastolic pressure is normal. The left ventricular ejection fraction is 55-65% by visual estimate.   1. Minor nonobstructive CAD 2. Normal LV function 3. Normal LVEDP  PAST GI PROCEDURES:  Colonoscopy 11/10/2019: - Hemorrhoids found on digital rectal exam. - The examined portion of the ileum was normal. - Two 2 to 3 mm polyps in the transverse colon, removed with a cold snare. Resected and retrieved. - Diverticulosis in the recto-sigmoid colon and in the sigmoid colon. - Normal mucosa in the entire examined colon. - Non-bleeding non-thrombosed external and internal hemorrhoids. - 7 year recall colonoscopy  TUBULAR ADENOMA WITHOUT HIGH-GRADE DYSPLASIA OR MALIGNANCY - OTHER FRAGMENT OF POLYPOID COLONIC MUCOSA WITH NO SPECIFIC HISTOPATHOLOGIC CHANGES  Current Outpatient Medications on File Prior to Visit   Medication Sig Dispense Refill   Accu-Chek Softclix Lancets lancets Use as instructed (Patient taking differently: 1 each by Other route See admin instructions. Use as instructed) 100 each 12   acetaminophen (TYLENOL) 500 MG tablet Take 500 mg by mouth every 6 (six) hours as needed for mild pain, fever or headache.     aspirin EC 81 MG EC tablet Take 1 tablet (81 mg total) by mouth daily. 90 tablet 3   Blood Glucose Monitoring Suppl (ACCU-CHEK AVIVA CONNECT) w/Device KIT Check sugar 1x daily (Patient taking differently: 1 each by Other route See admin instructions. Check sugar 1x daily) 1 kit 0   carvedilol (COREG) 25 MG tablet Take 25 mg by mouth 2 (two) times daily with a meal.     cetirizine (ZYRTEC) 10 MG tablet Take 10 mg by mouth daily.     cholecalciferol (VITAMIN D3) 25 MCG (1000 UNIT) tablet Take 1,000 Units by mouth daily.     cyanocobalamin 1000 MCG tablet Take 1,000 mcg by mouth daily.     escitalopram (LEXAPRO) 10 MG tablet Take 10 mg by mouth daily.     furosemide (LASIX) 40 MG tablet Take 40 mg by mouth 2 (two) times daily as needed for fluid.     gabapentin (NEURONTIN) 300 MG capsule Take 1 capsule (300 mg total) by mouth 2 (two) times daily. Start at 1 cap once daily, and increase to twice daily if she tolerates. Continue 620m at night (Patient taking differently: Take 300-600 mg by mouth See admin instructions. Take 300 mg by mouth in the morning and 600 mg at night) 60 capsule 0   glucose blood (ACCU-CHEK AVIVA PLUS) test strip Check sugar 1x daily (Patient taking differently: 1 each by Other route See admin instructions. Check sugar 1x daily) 100 each 12   insulin glargine (LANTUS SOLOSTAR) 100 UNIT/ML Solostar Pen Inject into the skin. Take 25 units at bedtime and 30 units in the morning     Insulin Pen Needle (PEN NEEDLES) 30G X 8 MM MISC 1 each by Does not apply route daily. E11.9 90 each 0   ketoconazole (NIZORAL) 2 % shampoo Apply 1 Application topically 2 (two) times a  week. 240 mL 0   magnesium oxide (MAG-OX) 400 MG tablet Take 400 mg by mouth 2 (two) times daily.     methocarbamol (ROBAXIN-750) 750 MG tablet Take 1 tablet (750 mg total) by mouth 2 (two) times daily as needed for muscle spasms. 20 tablet 0   mycophenolate (MYFORTIC) 180 MG EC tablet Take 360 mg by mouth 2 (two)  times daily.     NOVOLOG FLEXPEN 100 UNIT/ML FlexPen Inject 5 Units into the skin 3 (three) times daily as needed (blood sugar of 200 or above).     oxyCODONE (OXY IR/ROXICODONE) 5 MG immediate release tablet Take 1 tablet (5 mg total) by mouth every 6 (six) hours as needed for severe pain. For AFTER surgery only, do not take and drive 15 tablet 0   pantoprazole (PROTONIX) 40 MG tablet Take 40 mg by mouth 2 (two) times daily.  5   predniSONE (DELTASONE) 5 MG tablet Take 5 mg by mouth daily.   2   rosuvastatin (CRESTOR) 5 MG tablet Take 5 mg by mouth at bedtime.     senna-docusate (SENOKOT-S) 8.6-50 MG tablet Take 2 tablets by mouth at bedtime. For AFTER surgery, do not take if having diarrhea 30 tablet 0   tacrolimus ER (ENVARSUS XR) 1 MG TB24 Take 4 tablets by mouth daily.     tamoxifen (NOLVADEX) 20 MG tablet Take 1 tablet (20 mg total) by mouth daily. 30 tablet 3   tirzepatide (MOUNJARO) 12.5 MG/0.5ML Pen Inject 12.5 mg into the skin once a week.     No current facility-administered medications on file prior to visit.   Allergies  Allergen Reactions   Docetaxel Nausea Only    Diaphoretic.  Patient had hypersensitivity reaction to docetaxel.  See progress note from 05/24/2021.  Patient able to complete infusion.   Norvasc [Amlodipine] Swelling   Tape Other (See Comments)    Burns skin.  Please use paper tape only   Current Medications, Allergies, Past Medical History, Past Surgical History, Family History and Social History were reviewed in [employer] record.  Review of Systems:   Constitutional: Negative for fever, sweats, chills or weight loss.   Respiratory: Negative for shortness of breath.   Cardiovascular: Negative for chest pain, palpitations and leg swelling.  Gastrointestinal: See HPI.  Musculoskeletal: Lower back, shoulder, hands and knee pain. Scoliosis.  Neurological: Negative for dizziness, headaches or paresthesias.    Physical Exam: BP (!) 152/68   Pulse 79   Ht 5' 1" (1.549 m)   Wt 177 lb 2 oz (80.3 kg)   BMI 33.47 kg/m   General: 51 year old female in no acute distress. Head: Normocephalic and atraumatic. Eyes: No scleral icterus. Conjunctiva pink . Ears: Normal auditory acuity. Mouth: Dentition intact. No ulcers or lesions.  Lungs: Clear throughout to auscultation. Heart: Regular rate and rhythm, no murmur. Abdomen: Soft, nontender and nondistended. No masses or hepatomegaly. Normal bowel sounds x 4 quadrants.  Rectal: Deferred. Musculoskeletal: Symmetrical with no gross deformities. Extremities: No edema. Neurological: Alert oriented x 4. No focal deficits.  Psychological: Alert and cooperative. Normal mood and affect  Assessment and Recommendations:  21) 51 year old female with GERD symptoms on Pantoprazole 40 mg twice daily for approximately 1 year with dysphagia with saliva and dry foods. -EGD benefits and risks discussed including risk with sedation, risk of bleeding, perforation and infection  -GERD diet -Continue Pantoprazole 40 mg p.o. twice daily for now -Further recommendations to be determined after EGD completed  2) Constipation with associated abdominal bloat -Miralax nightly as tolerated -Benefiber 1 tablespoon daily  3) History of tubular adenomatous polyps per colonoscopy 10/2019 -Next colon polyp surveillance colonoscopy due 10/2026  4) History of nonobstructive coronary artery disease  5) History of breast cancer s/p left lumpectomy + BRCA2 mutation -> s/p bilateral mastectomies 07/2021.  Past partial hysterectomy with plans for BSO scheduled 07/11/2022.  6)  History of a renal  transplant 09/2015 on chronic immunosuppressants

## 2022-05-14 NOTE — Patient Instructions (Addendum)
You have been scheduled for an endoscopy. Please follow written instructions given to you at your visit today. If you use inhalers (even only as needed), please bring them with you on the day of your procedure.   Hemos enviado los siguientes medicamentos a su farmacia para que los recoja cuando le convenga: Famotidine 20 mg  Miralax (en el [employer])- tomar por la noche segn la tolerancia  Benefiber (en el mostrador)-  tomar 1 cucharada al da  Continue Pantoprazole 40 mg twice daily   Thank you for trusting me with your gastrointestinal care!   Carl Best, CRNP

## 2022-05-15 NOTE — Progress Notes (Signed)
Attending Physician's Attestation   I have reviewed the chart.   I agree with the Advanced Practitioner's note, impression, and recommendations with any updates as below. Diagnostic and potentially therapeutic EGD is very reasonable.  I will see her soon.   Justice Britain, MD [facility] Advanced Endoscopy Office # [phone]

## 2022-05-16 NOTE — Telephone Encounter (Signed)
Daughter called back to see about rescheduling ultrasound appointment.  Gave her number to centralized scheduling for her to reschedule.  She confirmed and understood.

## 2022-05-21 ENCOUNTER — Ambulatory Visit: Payer: Medicare HMO | Attending: Physician Assistant | Admitting: Physical Therapy

## 2022-05-21 ENCOUNTER — Encounter: Payer: Self-pay | Admitting: Physical Therapy

## 2022-05-21 DIAGNOSIS — M62830 Muscle spasm of back: Secondary | ICD-10-CM | POA: Diagnosis present

## 2022-05-21 DIAGNOSIS — G8929 Other chronic pain: Secondary | ICD-10-CM | POA: Insufficient documentation

## 2022-05-21 DIAGNOSIS — M5416 Radiculopathy, lumbar region: Secondary | ICD-10-CM | POA: Diagnosis present

## 2022-05-21 DIAGNOSIS — M5441 Lumbago with sciatica, right side: Secondary | ICD-10-CM | POA: Insufficient documentation

## 2022-05-21 DIAGNOSIS — M5459 Other low back pain: Secondary | ICD-10-CM | POA: Insufficient documentation

## 2022-05-21 NOTE — Addendum Note (Signed)
Addended by: Sumner Boast on: 05/21/2022 10:05 AM   Modules accepted: Orders

## 2022-05-21 NOTE — Therapy (Signed)
OUTPATIENT PHYSICAL THERAPY THORACOLUMBAR EVALUATION   Patient Name: Cynthia [name] MRN: [id number] DOB:1970/10/25, 51 y.o., female Today's Date: 05/21/2022  END OF SESSION:  PT End of Session - 05/21/22 0932     Visit Number 1    Date for PT Re-Evaluation 06/24/22    Authorization Type humana - update auth as approved    PT Start Time 0930    PT Stop Time 1015    PT Time Calculation (min) 45 min    Activity Tolerance Patient tolerated treatment well    Behavior During Therapy WFL for tasks assessed/performed             Past Medical History:  Diagnosis Date   Allergy    pollen   Anemia    Anxiety    BRCA2 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 03/01/2021   Breast cancer [facility])    Cataract    surgical repair bilateral   Complication of anesthesia    not herself for 2 days after last surgery (brest lumpectomy)   Coronary artery disease    Depression    ESRD on hemodialysis ([place])    Home HD 5x per week- not on dialysis now had tramsplant 4/17   Family history of ovarian cancer 02/22/2021   GERD (gastroesophageal reflux disease)    Hearing loss 2017   right ear   History of blood transfusion    transfusion reaction   Hyperlipidemia    Hypertension    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 per pt-dr levy also II; Per patient 08/18/21 she is Type 2   Kidney transplant recipient    Neuromuscular disorder ([place])    NEUROPATHY   Sleep apnea    Past Surgical History:  Procedure Laterality Date   ABDOMINAL HYSTERECTOMY     BREAST EXCISIONAL BIOPSY Right 08/2015   BREAST LUMPECTOMY WITH RADIOACTIVE SEED LOCALIZATION Right 09/14/2015   Procedure: RIGHT BREAST LUMPECTOMY WITH RADIOACTIVE SEED LOCALIZATION;  Surgeon: Donnie Mesa, MD;  Location: [place];  Service: General;  Laterality: Right;   BREAST LUMPECTOMY WITH RADIOACTIVE SEED LOCALIZATION Left 03/02/2021   Procedure: LEFT BREAST SEED LUMPECTOMY  LEFT SENTINEL LYMPH NODE [place];  Surgeon: Erroll Luna, MD;  Location: [place];  Service: General;  Laterality: Left;  GEN AND PEC BLOCK   BREAST SURGERY Bilateral    biopsy bilateral   CARDIAC CATHETERIZATION     CATARACT EXTRACTION Bilateral    bilateral   CHOLECYSTECTOMY     CYST REMOVAL NECK     DIALYSIS FISTULA CREATION Left    EYE SURGERY Bilateral    [facility]   kidney transplant     LEFT HEART CATH AND CORONARY ANGIOGRAPHY N/A 03/04/2019   Procedure: LEFT HEART CATH AND CORONARY ANGIOGRAPHY;  Surgeon: Martinique, Peter M, MD;  Location: [place] CV LAB;  Service: Cardiovascular;  Laterality: N/A;   LEFT HEART CATHETERIZATION WITH CORONARY ANGIOGRAM N/A 03/30/2014   Procedure: LEFT HEART CATHETERIZATION WITH CORONARY ANGIOGRAM;  Surgeon: Sinclair Grooms, MD;  [facility] CATH LAB;  Service: Cardiovascular;  Laterality: N/A;   LIPOMA EXCISION N/A 02/06/2017   Procedure: EXCISION POSTERIOR NECK SEBACEOUS CYST;  Surgeon: Coralie Keens, MD;  Location: [place];  Service: General;  Laterality: N/A;   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   Procedure: REPAIR OF ARTERIOVENOUS FISTULA ANEURYSM;  Surgeon: Serafina Mitchell, MD;  Location: [place];  Service: Vascular;  Laterality: Left;   REVISON  OF ARTERIOVENOUS FISTULA Left 09/28/2013   Procedure: EXCISE ESCHAR LEFT ARM  ARTERIOVENOUS FISTULA WITH PLICATION OF LEFT ARM ARTERIOVENOUS FISTULA;  Surgeon: Elam Dutch, MD;  Location: [place];  Service: Vascular;  Laterality: Left;   REVISON OF ARTERIOVENOUS FISTULA Left 08/26/2015   Procedure: RESECTION ANEURYSM OF LEFT ARM ARTERIOVENOUS FISTULA  ;  Surgeon: Serafina Mitchell, MD;  Location: [place];  Service: Vascular;  Laterality: Left;   SIMPLE MASTECTOMY WITH AXILLARY SENTINEL NODE BIOPSY Bilateral 08/22/2021   Procedure: BILATERAL SIMPLE MASTECTOMY;  Surgeon: Erroll Luna, MD;  Location: [place];  Service: General;  Laterality: Bilateral;   TUBAL  LIGATION     Patient Active Problem List   Diagnosis Date Noted   Fever 08/24/2021   AKI (acute kidney injury/renal transplant on immunosuppresants  08/24/2021   BRCA2 gene mutation positive s/p bilateral masectomy  08/22/2021   Port-A-Cath in place 07/12/2021   Hyponatremia 07/03/2021   UTI (urinary tract infection) 07/02/2021   Leukocytosis 02/77/4128   Complicated UTI (urinary tract infection) 06/13/2021   PICC (peripherally inserted central catheter) in place 06/13/2021   Stage 3a chronic kidney disease (CKD) ([place]) 05/28/2021   Thrombocytopenia ([place]) 05/28/2021   Aortic atherosclerosis ([place]) 03/07/2021   Hypercholesterolemia 03/07/2021   BRCA2 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 03/01/2021   Genetic testing 03/01/2021   Family history of ovarian cancer 02/22/2021   Malignant neoplasm of lower-inner quadrant of left breast in female, estrogen receptor positive ([place]) 02/15/2021   Foot pain, bilateral 07/28/2020   COVID-19 virus infection 06/16/2019   Insulin dependent type 2 diabetes mellitus ([place]) 06/06/2019   Hyperglycemia 06/02/2019   DKA (diabetic ketoacidoses) 06/02/2019   HTN (hypertension) 03/04/2019   Abnormal nuclear stress test 03/04/2019   Diabetic nephropathy ([place]) 03/03/2019   History of renal transplant 11/18/2015   Diabetic retinopathy associated with type 2 diabetes mellitus ([place]) 04/13/2015   Postprandial vomiting    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   Anemia 01/04/2015   Pseudoaneurysm of arteriovenous dialysis fistula ([place]) 03/16/2014   Hyperkalemia 11/16/2013   Chronic constipation 07/28/2013   Paresthesias 07/28/2013   Diabetes mellitus type I ([place])    Kidney transplant status 05/28/2013   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/11/2013   Precordial chest pain 02/10/2013   Awaiting organ transplant 09/19/2012    PCP: Arthur Holms, NP  REFERRING PROVIDER: Venida Jarvis, [place]  REFERRING DIAG: LBP with sciatica  Rationale for Evaluation and Treatment: Rehabilitation  THERAPY  DIAG:  Other low back pain  Radiculopathy, lumbar region  ONSET DATE: 04/20/22  SUBJECTIVE:  SUBJECTIVE STATEMENT: Patient reports that she has had LBP for a few month, unsure of how it started.  X-rays show degenerative changes  PERTINENT HISTORY:  Kidney transplant  PAIN:  Are you having pain? Yes: NPRS scale: 4/10 Pain location: right low back and into the leg Pain description: tight Aggravating factors: lifting granddaughter sometimes worse at night pain up to 8/10 Relieving factors: cold, mm relaxer can get down to 2/10  PRECAUTIONS: None  WEIGHT BEARING RESTRICTIONS: No  FALLS:  Has patient fallen in last 6 months? No  LIVING ENVIRONMENT: Lives with: lives with their family and housework, some gardening Lives in: House/apartment Stairs: Yes: Internal: 15 steps; can reach both Has following equipment at home: None  OCCUPATION: no work  PLOF: Independent  PATIENT GOALS: lift grand daughter without pain  NEXT MD VISIT:   OBJECTIVE:   DIAGNOSTIC FINDINGS:  Degenerative changes  PATIENT SURVEYS:  FOTO ODI 62%  COGNITION: Overall cognitive status: Within functional limits for tasks assessed     SENSATION: Numbness in the right shin  MUSCLE LENGTH: Hamstrings: Right 40 deg; Left 60 deg POSTURE: rounded shoulders and forward head  PALPATION: Tight in the lumbar area, very tender in the right buttock and the right LE  LUMBAR ROM:   AROM eval  Flexion Decreased 50%  Extension Decreased 100%  Right lateral flexion Decreased 25%  Left lateral flexion Decreased 25%  Right rotation   Left rotation    (Blank rows = not tested)  LOWER EXTREMITY ROM:     Active  Right eval Left eval  Hip flexion    Hip extension    Hip abduction    Hip adduction    Hip internal  rotation    Hip external rotation    Knee flexion    Knee extension Very painful knee ext   Ankle dorsiflexion    Ankle plantarflexion    Ankle inversion    Ankle eversion     (Blank rows = not tested)  LOWER EXTREMITY MMT:    MMT Right eval Left eval  Hip flexion 4- 4-  Hip extension 4- 4-  Hip abduction 4- 4-  Hip adduction    Hip internal rotation    Hip external rotation    Knee flexion    Knee extension 4- 4-  Ankle dorsiflexion 4- 4-  Ankle plantarflexion    Ankle inversion    Ankle eversion     (Blank rows = not tested)  LUMBAR SPECIAL TESTS:  Straight leg raise test: Positive 40 degrees right, 60 degrees left Very painful right knee extension in sitting with pain in the buttock FUNCTIONAL TESTS:  5 times sit to stand: 25  GAIT: Distance walked: 60 feet Assistive device utilized: None Level of assistance: Complete Independence Comments: significant antalgic gait on the right  TODAY'S TREATMENT:  DATE:  05/21/22 MHP/IFC to right low back in supine    PATIENT EDUCATION:  Education details: see below and POC Person educated: Patient Education method: Explanation, Demonstration, and Handouts Education comprehension: verbalized understanding  HOME EXERCISE PROGRAM: Access Code: [email or web address] URL: [email or web address] Date: 05/21/2022 Prepared by: Lum Babe  Exercises - Supine Lower Trunk Rotation  - 2 x daily - 7 x weekly - 1 sets - 10 reps - 10 hold - Supine Double Knee to Chest  - 2 x daily - 7 x weekly - 1 sets - 10 reps - 10 hold - Hooklying Single Knee to Chest  - 2 x daily - 7 x weekly - 1 sets - 10 reps - 10 hold - Supine Piriformis Stretch Pulling Heel to Hip  - 2 x daily - 7 x weekly - 1 sets - 10 reps - 10 hold  ASSESSMENT:  CLINICAL IMPRESSION: Patient is a 51 y.o. female who was seen today for  physical therapy evaluation and treatment for right low back pain, reports unsure of cause, x-rays show DDD.  Very tight and sore SLR and difficulty straightening the right knee in sitting.  Significant limp with walking, reports severe pain lifting grand daughter  OBJECTIVE IMPAIRMENTS: cardiopulmonary status limiting activity, decreased activity tolerance, decreased endurance, decreased mobility, difficulty walking, decreased ROM, decreased strength, increased fascial restrictions, increased muscle spasms, impaired flexibility, improper body mechanics, postural dysfunction, and pain.   REHAB POTENTIAL: Good  CLINICAL DECISION MAKING: Stable/uncomplicated  EVALUATION COMPLEXITY: Low   GOALS: Goals reviewed with patient? Yes  SHORT TERM GOALS: Target date: 05/31/22  Independent with initial HEP Goal status: INITIAL   LONG TERM GOALS: Target date: 08/19/22  Understand posture and body mechanics Goal status: INITIAL  2.  Increase ROM 25% Goal status: INITIAL  3.  Decrease pain 50% Goal status: INITIAL  4.  Lift grand daughter without pain >5/10 Goal status: INITIAL  5.  Able to do housework without pain >3/10 Goal status: INITIAL PLAN:  PT FREQUENCY: 1-2x/week  PT DURATION: 12 weeks  PLANNED INTERVENTIONS: Therapeutic exercises, Therapeutic activity, Neuromuscular re-education, Balance training, Gait training, Patient/Family education, Self Care, Joint mobilization, Dry Needling, Electrical stimulation, Spinal mobilization, Cryotherapy, Moist heat, Taping, Traction, Ultrasound, and Manual therapy.  PLAN FOR NEXT SESSION: try to get her moving treat pain as needed   Sumner Boast, PT 05/21/2022, 9:33 AM

## 2022-05-24 ENCOUNTER — Ambulatory Visit (HOSPITAL_COMMUNITY)
Admission: RE | Admit: 2022-05-24 | Discharge: 2022-05-24 | Disposition: A | Discharge status: Home / Self Care | Payer: Medicare HMO | Source: Ambulatory Visit | Attending: Gynecologic Oncology | Admitting: Gynecologic Oncology

## 2022-05-24 DIAGNOSIS — Z1502 Genetic susceptibility to malignant neoplasm of ovary: Secondary | ICD-10-CM | POA: Diagnosis present

## 2022-05-24 DIAGNOSIS — Z1501 Genetic susceptibility to malignant neoplasm of breast: Secondary | ICD-10-CM | POA: Diagnosis not present

## 2022-05-24 DIAGNOSIS — Z1509 Genetic susceptibility to other malignant neoplasm: Secondary | ICD-10-CM | POA: Diagnosis present

## 2022-05-25 ENCOUNTER — Ambulatory Visit: Payer: Medicare HMO | Attending: Physician Assistant | Admitting: Physical Therapy

## 2022-05-29 ENCOUNTER — Ambulatory Visit: Payer: Medicare HMO | Admitting: Physical Therapy

## 2022-05-31 ENCOUNTER — Ambulatory Visit: Payer: Medicare HMO | Admitting: Physical Therapy

## 2022-06-02 ENCOUNTER — Ambulatory Visit (AMBULATORY_SURGERY_CENTER): Payer: Medicare HMO | Admitting: Gastroenterology

## 2022-06-02 ENCOUNTER — Encounter: Payer: Self-pay | Admitting: Gastroenterology

## 2022-06-02 VITALS — BP 157/65 | HR 75 | Temp 97.7°F | Resp 10 | Ht 61.0 in | Wt 177.0 lb

## 2022-06-02 DIAGNOSIS — K449 Diaphragmatic hernia without obstruction or gangrene: Secondary | ICD-10-CM

## 2022-06-02 DIAGNOSIS — K2970 Gastritis, unspecified, without bleeding: Secondary | ICD-10-CM | POA: Diagnosis not present

## 2022-06-02 DIAGNOSIS — R1310 Dysphagia, unspecified: Secondary | ICD-10-CM | POA: Diagnosis not present

## 2022-06-02 DIAGNOSIS — K3189 Other diseases of stomach and duodenum: Secondary | ICD-10-CM | POA: Diagnosis not present

## 2022-06-02 DIAGNOSIS — R12 Heartburn: Secondary | ICD-10-CM

## 2022-06-02 DIAGNOSIS — K317 Polyp of stomach and duodenum: Secondary | ICD-10-CM | POA: Diagnosis not present

## 2022-06-02 DIAGNOSIS — K219 Gastro-esophageal reflux disease without esophagitis: Secondary | ICD-10-CM

## 2022-06-02 MED ORDER — DEXTROSE 5 % IV SOLN: Freq: Once | INTRAVENOUS | Status: DC

## 2022-06-02 MED ORDER — SODIUM CHLORIDE 0.9 % IV SOLN: 500.0000 mL | Freq: Once | INTRAVENOUS | Status: DC

## 2022-06-02 NOTE — Progress Notes (Unsigned)
GASTROENTEROLOGY PROCEDURE H&P NOTE   Primary Care Physician: Arthur Holms, NP  HPI: Cynthia [name] is a 51 y.o. female who presents for EGD for evaluation of progressive acid reflux symptoms as well as abdominal bloating and dysphagia.  Patient does have recent symptoms of congestion and runny nose but no exposures to COVID.  She has normal O2 saturations and normal lung sounds.  Past Medical History:  Diagnosis Date   Allergy    pollen   Anemia    Anxiety    BRCA2 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 03/01/2021   Breast cancer [facility])    Cataract    surgical repair bilateral   Complication of anesthesia    not herself for 2 days after last surgery (b[place] lumpectomy)   Coronary artery disease    Depression    ESRD on hemodialysis ([place])    Home HD 5x per week- not on dialysis now had tramsplant 4/17   Family history of ovarian cancer 02/22/2021   GERD (gastroesophageal reflux disease)    Hearing loss 2017   right ear   History of blood transfusion    transfusion reaction   Hyperlipidemia    Hypertension    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 per pt-dr levy also II; Per patient 08/18/21 she is Type 2   Kidney transplant recipient    Neuromuscular disorder ([place])    NEUROPATHY   Sleep apnea    Past Surgical History:  Procedure Laterality Date   ABDOMINAL HYSTERECTOMY     BREAST EXCISIONAL BIOPSY Right 08/2015   BREAST LUMPECTOMY WITH RADIOACTIVE SEED LOCALIZATION Right 09/14/2015   Procedure: RIGHT BREAST LUMPECTOMY WITH RADIOACTIVE SEED LOCALIZATION;  Surgeon: Donnie Mesa, MD;  Location: [place];  Service: General;  Laterality: Right;   BREAST LUMPECTOMY WITH RADIOACTIVE SEED LOCALIZATION Left 03/02/2021   Procedure: LEFT BREAST SEED LUMPECTOMY LEFT SENTINEL LYMPH NODE [place];  Surgeon: Erroll Luna, MD;  Location: [place];  Service: General;  Laterality: Left;  GEN AND PEC  BLOCK   BREAST SURGERY Bilateral    biopsy bilateral   CARDIAC CATHETERIZATION     CATARACT EXTRACTION Bilateral    bilateral   CHOLECYSTECTOMY     CYST REMOVAL NECK     DIALYSIS FISTULA CREATION Left    EYE SURGERY Bilateral    lazer   kidney transplant     LEFT HEART CATH AND CORONARY ANGIOGRAPHY N/A 03/04/2019   Procedure: LEFT HEART CATH AND CORONARY ANGIOGRAPHY;  Surgeon: Martinique, Peter M, MD;  Location: [place] [facility];  Service: Cardiovascular;  Laterality: N/A;   LEFT HEART CATHETERIZATION WITH CORONARY ANGIOGRAM N/A 03/30/2014   Procedure: LEFT HEART CATHETERIZATION WITH CORONARY ANGIOGRAM;  Surgeon: Sinclair Grooms, MD;  Location: [facility] CATH LAB;  Service: Cardiovascular;  Laterality: N/A;   LIPOMA EXCISION N/A 02/06/2017   Procedure: EXCISION POSTERIOR NECK SEBACEOUS CYST;  Surgeon: Coralie Keens, MD;  Location: [place];  Service: General;  Laterality: N/A;   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   Procedure: REPAIR OF ARTERIOVENOUS FISTULA ANEURYSM;  Surgeon: Serafina Mitchell, MD;  Location: [facility];  Service: Vascular;  Laterality: Left;   REVISON OF ARTERIOVENOUS FISTULA Left 09/28/2013   Procedure: EXCISE ESCHAR LEFT ARM  ARTERIOVENOUS FISTULA WITH PLICATION OF LEFT ARM ARTERIOVENOUS FISTULA;  Surgeon: Elam Dutch, MD;  Location: [place];  Service: Vascular;  Laterality: Left;   REVISON OF ARTERIOVENOUS FISTULA Left  08/26/2015   Procedure: RESECTION ANEURYSM OF LEFT ARM ARTERIOVENOUS FISTULA  ;  Surgeon: Serafina Mitchell, MD;  Location: [place];  Service: Vascular;  Laterality: Left;   SIMPLE MASTECTOMY WITH AXILLARY SENTINEL NODE BIOPSY Bilateral 08/22/2021   Procedure: BILATERAL SIMPLE MASTECTOMY;  Surgeon: Erroll Luna, MD;  Location: [place];  Service: General;  Laterality: Bilateral;   TUBAL LIGATION     Current Outpatient Medications  Medication Sig Dispense Refill   DROPLET PEN NEEDLES 31G X 8 MM MISC      hydrALAZINE (APRESOLINE) 25 MG  tablet Take 25 mg by mouth 2 (two) times daily.     tacrolimus (PROGRAF) 1 MG capsule Take by mouth.     Accu-Chek Softclix Lancets lancets Use as instructed (Patient taking differently: 1 each by Other route See admin instructions. Use as instructed) 100 each 12   acetaminophen (TYLENOL) 500 MG tablet Take 500 mg by mouth every 6 (six) hours as needed for mild pain, fever or headache.     aspirin EC 81 MG EC tablet Take 1 tablet (81 mg total) by mouth daily. 90 tablet 3   Blood Glucose Monitoring Suppl (ACCU-CHEK AVIVA CONNECT) w/Device KIT Check sugar 1x daily (Patient taking differently: 1 each by Other route See admin instructions. Check sugar 1x daily) 1 kit 0   carvedilol (COREG) 25 MG tablet Take 25 mg by mouth 2 (two) times daily with a meal.     cetirizine (ZYRTEC) 10 MG tablet Take 10 mg by mouth daily.     cholecalciferol (VITAMIN D3) 25 MCG (1000 UNIT) tablet Take 1,000 Units by mouth daily.     cyanocobalamin 1000 MCG tablet Take 1,000 mcg by mouth daily.     escitalopram (LEXAPRO) 10 MG tablet Take 10 mg by mouth daily.     exemestane (AROMASIN) 25 MG tablet Take 25 mg by mouth.     famotidine (PEPCID) 20 MG tablet TAKE 1 TABLET(20 MG) BY MOUTH AT BEDTIME 90 tablet 1   furosemide (LASIX) 40 MG tablet Take 40 mg by mouth 2 (two) times daily as needed for fluid.     gabapentin (NEURONTIN) 300 MG capsule Take 1 capsule (300 mg total) by mouth 2 (two) times daily. Start at 1 cap once daily, and increase to twice daily if she tolerates. Continue 645m at night (Patient taking differently: Take 300-600 mg by mouth See admin instructions. Take 300 mg by mouth in the morning and 600 mg at night) 60 capsule 0   glucose blood (ACCU-CHEK AVIVA PLUS) test strip Check sugar 1x daily (Patient taking differently: 1 each by Other route See admin instructions. Check sugar 1x daily) 100 each 12   insulin glargine (LANTUS SOLOSTAR) 100 UNIT/ML Solostar Pen Inject into the skin. Take 25 units at bedtime  and 30 units in the morning     Insulin Pen Needle (PEN NEEDLES) 30G X 8 MM MISC 1 each by Does not apply route daily. E11.9 90 each 0   ketoconazole (NIZORAL) 2 % shampoo Apply 1 Application topically 2 (two) times a week. 240 mL 0   magnesium oxide (MAG-OX) 400 MG tablet Take 400 mg by mouth 2 (two) times daily.     methocarbamol (ROBAXIN-750) 750 MG tablet Take 1 tablet (750 mg total) by mouth 2 (two) times daily as needed for muscle spasms. 20 tablet 0   mycophenolate (MYFORTIC) 180 MG EC tablet Take 360 mg by mouth 2 (two) times daily.     NOVOLOG FLEXPEN 100 UNIT/ML  FlexPen Inject 5 Units into the skin 3 (three) times daily as needed (blood sugar of 200 or above).     oxyCODONE (OXY IR/ROXICODONE) 5 MG immediate release tablet Take 1 tablet (5 mg total) by mouth every 6 (six) hours as needed for severe pain. For AFTER surgery only, do not take and drive 15 tablet 0   pantoprazole (PROTONIX) 40 MG tablet Take 40 mg by mouth 2 (two) times daily.  5   predniSONE (DELTASONE) 5 MG tablet Take 5 mg by mouth daily.   2   rosuvastatin (CRESTOR) 5 MG tablet Take 5 mg by mouth at bedtime.     senna-docusate (SENOKOT-S) 8.6-50 MG tablet Take 2 tablets by mouth at bedtime. For AFTER surgery, do not take if having diarrhea 30 tablet 0   tamoxifen (NOLVADEX) 20 MG tablet Take 1 tablet (20 mg total) by mouth daily. 30 tablet 3   tirzepatide (MOUNJARO) 12.5 MG/0.5ML Pen Inject 12.5 mg into the skin once a week.     Current Facility-Administered Medications  Medication Dose Route Frequency Provider Last Rate Last Admin   0.9 %  sodium chloride infusion  500 mL Intravenous Once Mansouraty, Telford Nab., MD       dextrose 5 % solution   Intravenous Once Mansouraty, Telford Nab., MD        Current Outpatient Medications:    DROPLET PEN NEEDLES 31G X 8 MM MISC, , Disp: , Rfl:    hydrALAZINE (APRESOLINE) 25 MG tablet, Take 25 mg by mouth 2 (two) times daily., Disp: , Rfl:    tacrolimus (PROGRAF) 1 MG capsule,  Take by mouth., Disp: , Rfl:    Accu-Chek Softclix Lancets lancets, Use as instructed (Patient taking differently: 1 each by Other route See admin instructions. Use as instructed), Disp: 100 each, Rfl: 12   acetaminophen (TYLENOL) 500 MG tablet, Take 500 mg by mouth every 6 (six) hours as needed for mild pain, fever or headache., Disp: , Rfl:    aspirin EC 81 MG EC tablet, Take 1 tablet (81 mg total) by mouth daily., Disp: 90 tablet, Rfl: 3   Blood Glucose Monitoring Suppl (ACCU-CHEK AVIVA CONNECT) w/Device KIT, Check sugar 1x daily (Patient taking differently: 1 each by Other route See admin instructions. Check sugar 1x daily), Disp: 1 kit, Rfl: 0   carvedilol (COREG) 25 MG tablet, Take 25 mg by mouth 2 (two) times daily with a meal., Disp: , Rfl:    cetirizine (ZYRTEC) 10 MG tablet, Take 10 mg by mouth daily., Disp: , Rfl:    cholecalciferol (VITAMIN D3) 25 MCG (1000 UNIT) tablet, Take 1,000 Units by mouth daily., Disp: , Rfl:    cyanocobalamin 1000 MCG tablet, Take 1,000 mcg by mouth daily., Disp: , Rfl:    escitalopram (LEXAPRO) 10 MG tablet, Take 10 mg by mouth daily., Disp: , Rfl:    exemestane (AROMASIN) 25 MG tablet, Take 25 mg by mouth., Disp: , Rfl:    famotidine (PEPCID) 20 MG tablet, TAKE 1 TABLET(20 MG) BY MOUTH AT BEDTIME, Disp: 90 tablet, Rfl: 1   furosemide (LASIX) 40 MG tablet, Take 40 mg by mouth 2 (two) times daily as needed for fluid., Disp: , Rfl:    gabapentin (NEURONTIN) 300 MG capsule, Take 1 capsule (300 mg total) by mouth 2 (two) times daily. Start at 1 cap once daily, and increase to twice daily if she tolerates. Continue 672m at night (Patient taking differently: Take 300-600 mg by mouth See admin instructions. Take 300  mg by mouth in the morning and 600 mg at night), Disp: 60 capsule, Rfl: 0   glucose blood (ACCU-CHEK AVIVA PLUS) test strip, Check sugar 1x daily (Patient taking differently: 1 each by Other route See admin instructions. Check sugar 1x daily), Disp: 100  each, Rfl: 12   insulin glargine (LANTUS SOLOSTAR) 100 UNIT/ML Solostar Pen, Inject into the skin. Take 25 units at bedtime and 30 units in the morning, Disp: , Rfl:    Insulin Pen Needle (PEN NEEDLES) 30G X 8 MM MISC, 1 each by Does not apply route daily. E11.9, Disp: 90 each, Rfl: 0   ketoconazole (NIZORAL) 2 % shampoo, Apply 1 Application topically 2 (two) times a week., Disp: 240 mL, Rfl: 0   magnesium oxide (MAG-OX) 400 MG tablet, Take 400 mg by mouth 2 (two) times daily., Disp: , Rfl:    methocarbamol (ROBAXIN-750) 750 MG tablet, Take 1 tablet (750 mg total) by mouth 2 (two) times daily as needed for muscle spasms., Disp: 20 tablet, Rfl: 0   mycophenolate (MYFORTIC) 180 MG EC tablet, Take 360 mg by mouth 2 (two) times daily., Disp: , Rfl:    NOVOLOG FLEXPEN 100 UNIT/ML FlexPen, Inject 5 Units into the skin 3 (three) times daily as needed (blood sugar of 200 or above)., Disp: , Rfl:    oxyCODONE (OXY IR/ROXICODONE) 5 MG immediate release tablet, Take 1 tablet (5 mg total) by mouth every 6 (six) hours as needed for severe pain. For AFTER surgery only, do not take and drive, Disp: 15 tablet, Rfl: 0   pantoprazole (PROTONIX) 40 MG tablet, Take 40 mg by mouth 2 (two) times daily., Disp: , Rfl: 5   predniSONE (DELTASONE) 5 MG tablet, Take 5 mg by mouth daily. , Disp: , Rfl: 2   rosuvastatin (CRESTOR) 5 MG tablet, Take 5 mg by mouth at bedtime., Disp: , Rfl:    senna-docusate (SENOKOT-S) 8.6-50 MG tablet, Take 2 tablets by mouth at bedtime. For AFTER surgery, do not take if having diarrhea, Disp: 30 tablet, Rfl: 0   tamoxifen (NOLVADEX) 20 MG tablet, Take 1 tablet (20 mg total) by mouth daily., Disp: 30 tablet, Rfl: 3   tirzepatide (MOUNJARO) 12.5 MG/0.5ML Pen, Inject 12.5 mg into the skin once a week., Disp: , Rfl:   Current Facility-Administered Medications:    0.9 %  sodium chloride infusion, 500 mL, Intravenous, Once, Mansouraty, Telford Nab., MD   dextrose 5 % solution, , Intravenous, Once,  Mansouraty, Telford Nab., MD Allergies  Allergen Reactions   Docetaxel Nausea Only    Diaphoretic.  Patient had hypersensitivity reaction to docetaxel.  See progress note from 05/24/2021.  Patient able to complete infusion.   Norvasc [Amlodipine] Swelling   Tape Other (See Comments)    Burns skin.  Please use paper tape only   Family History  Problem Relation Age of Onset   Diabetes Mother    Kidney disease Mother    Diabetes Father    Heart disease Father    Kidney disease Father    Hypertension Father    Diabetes Sister    Asthma Sister    Lupus Sister    Diabetes Brother    Diabetes Brother    Diabetes Brother    Diabetes Brother    Cancer Maternal Grandmother 27       ovarian cancer   Ovarian cancer Maternal Grandmother    Diabetes Maternal Grandfather    Diabetes Paternal Grandmother    Colon cancer Neg Hx  Colon polyps Neg Hx    Esophageal cancer Neg Hx    Rectal cancer Neg Hx    Stomach cancer Neg Hx    Breast cancer Neg Hx    Endometrial cancer Neg Hx    Pancreatic cancer Neg Hx    Prostate cancer Neg Hx    Social History   Socioeconomic History   Marital status: Legally Separated    Spouse name: Not on file   Number of children: 3   Years of education: 9   Highest education level: Not on file  Occupational History   Occupation: disabled  Tobacco Use   Smoking status: Never   Smokeless tobacco: Never  Vaping Use   Vaping Use: Never used  Substance and Sexual Activity   Alcohol use: Not Currently    Comment: Socially   Drug use: No   Sexual activity: Not Currently    Birth control/protection: None, Surgical  Other Topics Concern   Not on file  Social History Narrative   Lives in home with daughters, grand daughter, son-in-law   Caffeine use - 1 cup coffee sometimes    Social Determinants of Health   Financial Resource Strain: High Risk (02/22/2021)   Overall Financial Resource Strain (CARDIA)    Difficulty of Paying Living Expenses: Hard   Food Insecurity: Food Insecurity Present (02/22/2021)   Hunger Vital Sign    Worried About Running Out of Food in the Last Year: Sometimes true    Ran Out of Food in the Last Year: Sometimes true  Transportation Needs: No Transportation Needs (02/22/2021)   PRAPARE - Hydrologist (Medical): No    Lack of Transportation (Non-Medical): No  Physical Activity: Not on file  Stress: Not on file  Social Connections: Not on file  Intimate Partner Violence: Not on file    Physical Exam: Today's Vitals   06/02/22 1151  BP: (!) 115/56  Pulse: 73  Temp: 97.7 F (36.5 C)  TempSrc: Temporal  SpO2: 99%  Weight: 177 lb (80.3 kg)  Height: _0  (1.549 m)   Body mass index is 33.44 kg/m. GEN: NAD EYE: Sclerae anicteric ENT: MMM CV: Non-tachycardic GI: Soft, NT/ND NEURO:  Alert & Oriented x 3  Lab Results: No results for input(s): "WBC", "HGB", "HCT", "PLT" in the last 72 hours. BMET No results for input(s): "NA", "K", "CL", "CO2", "GLUCOSE", "BUN", "CREATININE", "CALCIUM" in the last 72 hours. LFT No results for input(s): "PROT", "ALBUMIN", "AST", "ALT", "ALKPHOS", "BILITOT", "BILIDIR", "IBILI" in the last 72 hours. PT/INR No results for input(s): "LABPROT", "INR" in the last 72 hours.   Impression / Plan: This is a 51 y.o.female  who presents for EGD for evaluation of progressive acid reflux symptoms as well as abdominal bloating and dysphagia.  Patient does have recent symptoms of congestion and runny nose but no exposures to COVID.  She has normal O2 saturations and normal lung sounds.  The risks and benefits of endoscopic evaluation/treatment were discussed with the patient and/or family; these include but are not limited to the risk of perforation, infection, bleeding, missed lesions, lack of diagnosis, severe illness requiring hospitalization, as well as anesthesia and sedation related illnesses.  The patient's history has been reviewed, patient  examined, no change in status, and deemed stable for procedure.  The patient and/or family is agreeable to proceed.    Justice Britain, MD [facility] Office # [phone]

## 2022-06-02 NOTE — Progress Notes (Unsigned)
Sedate, gd SR, tolerated procedure well, VSS, report to RN 

## 2022-06-02 NOTE — Progress Notes (Signed)
Called to room to assist during endoscopic procedure.  Patient ID and intended procedure confirmed with present staff. Received instructions for my participation in the procedure from the performing physician.  

## 2022-06-02 NOTE — Progress Notes (Signed)
Interpreter, Ana Alvarez-Pevida , present during recovery.

## 2022-06-02 NOTE — Op Note (Signed)
[name] Procedure Date: 06/02/2022 12:38 PM MRN: [id number] Endoscopist: Justice Britain , MD, [id number] Age: 51 Referring MD:  Date of Birth: Mar 17, 1971 Gender: Female Account #: [id number] Procedure:                Upper GI endoscopy Indications:              Dysphagia, Heartburn Medicines:                Monitored Anesthesia Care Procedure:                Pre-Anesthesia Assessment:                           - Prior to the procedure, a History and Physical                            was performed, and patient medications and                            allergies were reviewed. The patient's tolerance of                            previous anesthesia was also reviewed. The risks                            and benefits of the procedure and the sedation                            options and risks were discussed with the patient.                            All questions were answered, and informed consent                            was obtained. Prior Anticoagulants: The patient has                            taken no anticoagulant or antiplatelet agents                            except for aspirin. ASA Grade Assessment: III - A                            patient with severe systemic disease. After                            reviewing the risks and benefits, the patient was                            deemed in satisfactory condition to undergo the                            procedure.  After obtaining informed consent, the endoscope was                            passed under direct vision. Throughout the                            procedure, the patient's blood pressure, pulse, and                            oxygen saturations were monitored continuously. The                            GIF [id number] was introduced through the                            mouth, and advanced to the second part of duodenum.                             The upper GI endoscopy was accomplished without                            difficulty. The patient tolerated the procedure. Scope In: Scope Out: Findings:                 No gross lesions were noted in the entire                            esophagus. Biopsies were taken with a cold forceps                            for histology. After the rest of the procedure was                            completed, dilation was performed with a Maloney                            dilator with no resistance at 54 Fr. The dilation                            site was examined following endoscope reinsertion                            and showed no change.                           The Z-line was regular and was found 35 cm from the                            incisors.                           A 2 cm hiatal hernia was present.  Multiple small semi-sessile polyps were found in                            the gastric body (appearance of fundic gland                            polyps). Biopsies were taken with a cold forceps                            for histology from a few of them.                           Patchy mild inflammation characterized by erosions                            and erythema was found in the entire examined                            stomach. Biopsies were taken with a cold forceps                            for histology and Helicobacter pylori testing.                           No gross lesions were noted in the duodenal bulb,                            in the first portion of the duodenum and in the                            second portion of the duodenum. Complications:            No immediate complications. Estimated Blood Loss:     Estimated blood loss was minimal. Impression:               - No gross lesions in the entire esophagus.                            Biopsied. Dilated with Luke. Z-line                             regular, 35 cm from the incisors.                           - 2 cm hiatal hernia.                           - Multiple gastric polyps. Biopsied.                           - Gastritis. Biopsied.                           - No gross lesions in the duodenal bulb, in the  first portion of the duodenum and in the second                            portion of the duodenum. Recommendation:           - The patient will be observed post-procedure,                            until all discharge criteria are met.                           - Discharge patient to home.                           - Patient has a contact number available for                            emergencies. The signs and symptoms of potential                            delayed complications were discussed with the                            patient. Return to normal activities tomorrow.                            Written discharge instructions were provided to the                            patient.                           - Please use Cepacol or Halls Lozenges +/-                            Chloraseptic spray for next 72-96 hours to aid in                            sore thoat should you experience this.                           - Dilation diet as per protocol.                           - Continue present medications.                           - Await pathology results.                           - Repeat upper endoscopy PRN for retreatment if                            patient has adequate response to dysphagia  symptoms. If not, esophageal manometry will need to                            be considered.                           - The findings and recommendations were discussed                            with the patient.                           - The findings and recommendations were discussed                            with the patient's family. Justice Britain, MD 06/02/2022 1:06:25 PM

## 2022-06-02 NOTE — Patient Instructions (Signed)
Nothing by mouth until 2:00pm, clear liquids until 3:00pm, soft foods for the rest of today.  Tomorrow you may resume normal diet.  Please use Cepacol or Halls lozenges/chloraseptic spray for 72-96 hours if needed for sore throat.  Appointment scheduled for Feb. 2 at 8:30am. Continue present medications.      YOU HAD AN ENDOSCOPIC PROCEDURE TODAY AT [facility]:   Refer to the procedure report that was given to you for any specific questions about what was found during the examination.  If the procedure report does not answer your questions, please call your gastroenterologist to clarify.  If you requested that your care partner not be given the details of your procedure findings, then the procedure report has been included in a sealed envelope for you to review at your convenience later.  YOU SHOULD EXPECT: Some feelings of bloating in the abdomen. Passage of more gas than usual.  Walking can help get rid of the air that was put into your GI tract during the procedure and reduce the bloating. If you had a lower endoscopy (such as a colonoscopy or flexible sigmoidoscopy) you may notice spotting of blood in your stool or on the toilet paper. If you underwent a bowel prep for your procedure, you may not have a normal bowel movement for a few days.  Please Note:  You might notice some irritation and congestion in your nose or some drainage.  This is from the oxygen used during your procedure.  There is no need for concern and it should clear up in a day or so.  SYMPTOMS TO REPORT IMMEDIATELY:   Following upper endoscopy (EGD)  Vomiting of blood or coffee ground material  New chest pain or pain under the shoulder blades  Painful or persistently difficult swallowing  New shortness of breath  Fever of 100F or higher  Black, tarry-looking stools  For urgent or emergent issues, a gastroenterologist can be reached at any hour by calling [phone]. Do not use MyChart messaging for  urgent concerns.    DIET:  Nothing by mouth until 2:00 pm, clear liquids until 3:00 pm, soft foods for the rest of today. Tomorrow you may proceed to your regular diet.  Drink plenty of fluids but you should avoid alcoholic beverages for 24 hours.  ACTIVITY:  You should plan to take it easy for the rest of today and you should NOT DRIVE or use heavy machinery until tomorrow (because of the sedation medicines used during the test).    FOLLOW UP: Our staff will call the number listed on your records the next business day following your procedure.  We will call around 7:15- 8:00 am to check on you and address any questions or concerns that you may have regarding the information given to you following your procedure. If we do not reach you, we will leave a message.     If any biopsies were taken you will be contacted by phone or by letter within the next 1-3 weeks.  Please call us at [phone] if you have not heard about the biopsies in 3 weeks.    SIGNATURES/CONFIDENTIALITY: You and/or your care partner have signed paperwork which will be entered into your electronic medical record.  These signatures attest to the fact that that the information above on your After Visit Summary has been reviewed and is understood.  Full responsibility of the confidentiality of this discharge information lies with you and/or your care-partner.

## 2022-06-02 NOTE — Progress Notes (Unsigned)
Pt's states no medical or surgical changes since previsit or office visit.  Interpreter used today at the [facility] for this pt.  Interpreter's name is- Ana  Pt reported sore throat and chills that started yesterday. Denies exposure to Covid 19 as far as she knows. MD made aware. Plan to proceed with procedure.  CBG 78. Pt symptomatic as she c/o shakiness. MD made aware. IV D5W started at [facility] rate per MD orders.

## 2022-06-04 ENCOUNTER — Telehealth: Payer: Self-pay

## 2022-06-04 NOTE — Telephone Encounter (Signed)
Patient questions:   Do you have a fever, pain , or abdominal swelling? No. Pain Score  0 *   Have you tolerated food without any problems? Yes.     Have you been able to return to your normal activities? Yes.     Do you have any questions about your discharge instructions: Diet                              No. Medications                 No. Follow up visit             No.   Do you have questions or concerns about your Care? No.   Verbalized no pain.

## 2022-06-05 ENCOUNTER — Ambulatory Visit: Payer: Medicare HMO | Admitting: Physical Therapy

## 2022-06-07 ENCOUNTER — Encounter: Payer: Self-pay | Admitting: Gastroenterology

## 2022-06-07 ENCOUNTER — Ambulatory Visit: Payer: Medicare HMO | Admitting: Physical Therapy

## 2022-06-12 ENCOUNTER — Ambulatory Visit: Payer: Medicare HMO | Admitting: Physical Therapy

## 2022-06-14 ENCOUNTER — Ambulatory Visit: Payer: Medicare HMO | Admitting: Physical Therapy

## 2022-06-20 ENCOUNTER — Ambulatory Visit: Payer: Medicare HMO

## 2022-06-22 ENCOUNTER — Ambulatory Visit: Payer: Medicare HMO

## 2022-07-05 NOTE — Patient Instructions (Addendum)
SURGICAL WAITING ROOM VISITATION  Patients having surgery or a procedure may have no more than 2 support people in the waiting area - these visitors may rotate.    Children under the age of 22 must have an adult with them who is not the patient.  Due to an increase in RSV and influenza rates and associated hospitalizations, children ages 25 and under may not visit patients in [place].  If the patient needs to stay at the hospital during part of their recovery, the visitor guidelines for inpatient rooms apply. Pre-op nurse will coordinate an appropriate time for 1 support person to accompany patient in pre-op.  This support person may not rotate.    Please refer to the [facility] website for the visitor guidelines for Inpatients (after your surgery is over and you are in a regular room).    Your procedure is scheduled on: 07/11/22   Report to [facility] Main Entrance    Report to admitting at 10:15 AM   Call this number if you have problems the morning of surgery [phone]   Do not eat food :After Midnight.   After Midnight you may have the following liquids until 9:00 AM DAY OF SURGERY  Water Non-Citrus Juices (without pulp, NO RED-Apple, White grape, White cranberry) Black Coffee (NO MILK/CREAM OR CREAMERS, sugar ok)  Clear Tea (NO MILK/CREAM OR CREAMERS, sugar ok) regular and decaf                             Plain Jell-O (NO RED)                                           Fruit ices (not with fruit pulp, NO RED)                                     Popsicles (NO RED)                                                               Sports drinks like Gatorade (NO RED)          If you have questions, please contact your surgeon's office.   FOLLOW BOWEL PREP AND ANY ADDITIONAL PRE OP INSTRUCTIONS YOU RECEIVED FROM YOUR SURGEON'S OFFICE!!!     Oral Hygiene is also important to reduce your risk of infection.                                    Remember -  BRUSH YOUR TEETH THE MORNING OF SURGERY WITH YOUR REGULAR TOOTHPASTE  DENTURES WILL BE REMOVED PRIOR TO SURGERY PLEASE DO NOT APPLY "Poly grip" OR ADHESIVES!!!   Take these medicines the morning of surgery with A SIP OF WATER: Tylenol, Carvedilol, Zyrtec, Lexapro, Gabapentin, Hydralazine, Oxycodone, Pantoprazole, Prednisone, Myfortic  DO NOT TAKE ANY ORAL DIABETIC MEDICATIONS DAY OF YOUR SURGERY  How to Manage Your Diabetes Before and After Surgery  Why is it important to control  my blood sugar before and after surgery? Improving blood sugar levels before and after surgery helps healing and can limit problems. A way of improving blood sugar control is eating a healthy diet by:  Eating less sugar and carbohydrates  Increasing activity/exercise  Talking with your doctor about reaching your blood sugar goals High blood sugars (greater than 180 mg/dL) can raise your risk of infections and slow your recovery, so you will need to focus on controlling your diabetes during the weeks before surgery. Make sure that the doctor who takes care of your diabetes knows about your planned surgery including the date and location.  How do I manage my blood sugar before surgery? Check your blood sugar at least 4 times a day, starting 2 days before surgery, to make sure that the level is not too high or low. Check your blood sugar the morning of your surgery when you wake up and every 2 hours until you get to the Short Stay unit. If your blood sugar is less than 70 mg/dL, you will need to treat for low blood sugar: Do not take insulin. Treat a low blood sugar (less than 70 mg/dL) with  cup of clear juice (cranberry or apple), 4 glucose tablets, OR glucose gel. Recheck blood sugar in 15 minutes after treatment (to make sure it is greater than 70 mg/dL). If your blood sugar is not greater than 70 mg/dL on recheck, call [phone] for further instructions. Report your blood sugar to the short stay nurse when  you get to Short Stay.  If you are admitted to the hospital after surgery: Your blood sugar will be checked by the staff and you will probably be given insulin after surgery (instead of oral diabetes medicines) to make sure you have good blood sugar levels. The goal for blood sugar control after surgery is 80-180 mg/dL.   WHAT DO I DO ABOUT MY DIABETES MEDICATION?  Do not take oral diabetes medicines (pills) the morning of surgery.  Do not take [facility] 07/11/22.   THE NIGHT BEFORE SURGERY, take morning dose of Lantus as prescribed. Take 50% of evening dose.     THE MORNING OF SURGERY, take 50% of Lantus.  DO NOT TAKE THE FOLLOWING 7 DAYS PRIOR TO SURGERY: Ozempic, Wegovy, Rybelsus (Semaglutide), Byetta (exenatide), Bydureon (exenatide ER), Victoza, Saxenda (liraglutide), or Trulicity (dulaglutide) Mounjaro (Tirzepatide) Adlyxin (Lixisenatide), Polyethylene Glycol Loxenatide.  Reviewed and Endorsed by [facility] Patient Education Committee, August 2015  Bring CPAP mask and tubing day of surgery.                              You may not have any metal on your body including hair pins, jewelry, and body piercing             Do not wear make-up, lotions, powders, perfumes, or deodorant  Do not wear nail polish including gel and S&S, artificial/acrylic nails, or any other type of covering on natural nails including finger and toenails. If you have artificial nails, gel coating, etc. that needs to be removed by a nail salon please have this removed prior to surgery or surgery may need to be canceled/ delayed if the surgeon/ anesthesia feels like they are unable to be safely monitored.   Do not shave  48 hours prior to surgery.    Do not bring valuables to the hospital. [place] IS NOT  RESPONSIBLE   FOR VALUABLES.   Contacts, glasses, dentures or bridgework may not be worn into surgery.  DO NOT [place]. PHARMACY WILL DISPENSE  MEDICATIONS LISTED ON YOUR MEDICATION LIST TO YOU DURING YOUR ADMISSION [place]!    Patients discharged on the day of surgery will not be allowed to drive home.  Someone NEEDS to stay with you for the first 24 hours after anesthesia.   Special Instructions: Bring a copy of your healthcare power of attorney and living will documents the day of surgery if you haven't scanned them before.              Please read over the following fact sheets you were given: IF YOU HAVE QUESTIONS ABOUT YOUR PRE-OP INSTRUCTIONS PLEASE CALL [phone]- Deitrich Steve  DEBIDO AL COVID-19 SLO SE PERMITEN DOS VISITANTES (de 16 aos en adelante)  PARA QUE VENGAN CON USTED Y SE QUEDEN EN LA SALA DE ESPERA SOLAMENTE DURANTE EL PRE OP Y EL PROCEDIMIENTO.   **NO SE PERMITEN VISITAS EN EL REA DE CORTA ESTADA NI EN LA SALA DE RECUPERACIN!!**  SI VA A SER INGRESADO(A) AL HOSPITAL SLO SE LE PERMITEN CUATRO PERSONAS DE APOYO DURANTE LAS HORAS DE VISITA (7 AM -8PM)   La(s) persona(s) de apoyo debe(n) pasar nuestra evaluacin, entrar y salir con gel y Teaching laboratory technician en todo momento, incluso en la habitacin del [place]. Los pacientes tambin deben usar una mscara cuando el personal o su persona de apoyo estn en la habitacin. Los visitantes DEBEN LLEVAR ETIQUETA DE VISITANTE DE UNA MANERA VISIBLE. Un visitante adulto International aid/development worker con usted durante la noche y DEBE estar en la habitacin a las 8 P.M.     Su procedimiento est programado en: 07/11/22   Presntese a la entrada [place] a admisiones por la maana 10:15   Llame a este nmero si tiene [employer] maana de la ciruga al [phone]   No consuma alimentos: Despus de la medianoche   Despus de la medianoche puede tomar los siguientes lquidos [employer] la(s) 9:30 AM DEL DA DE LA CIRUGA  Agua Caf negro (con azcar, SIN [place], NI CREMA)  T normal y descafeinado (con azcar, SIN [place], NI CREMA)                               Gelatina normal (NO ROJA)                                           Helados de frutas (sin pulpa. NO DE COLOR ROJO)                                     Helados de hielo (NO ROJO)                                                                  Jugo: de manzana, uva Richardson, arndano BLANCO Bebidas deportivas como Gatorade (NO ROJAS)  Si tiene preguntas, por favor. Pngase en contacto con la oficina de su cirujano.   SIGA LA PREPARACIN INTESTINAL Y CUALQUIER INSTRUCCIN ADICIONAL PREOPERATORIA QUE HAYA RECIBIDO DEL OFICINA DE DU CIRUJANO!!!     La higiene bucal tambin es importante para reducir el riesgo de infeccin.                                   Recuerde - LVESE LOS DIENTES EN LA MAANA DE LA CIRUGA CON SU PASTA DENTAL HABITUAL   NO fume despus de la medianoche   [place] [employer] en la maana de la ciruga con UN SORBO DE AGUA: Tylenol, Carvedilol, Zyrtec, Lexapro, Gabapentin, Hydralazine, Oxycodone, Pantoprazole, Prednisone, Myfortic  NO TOME NINGN MEDICAMENTO ORAL PARA LA DIABETES EL DA DE LA CIRUGA  COMO CONTROLAR LA DIABETES ANTES Y DESPUS DE LA CIRUGA  Por qu es importante controlar los niveles de azcar en la sangre antes y despus de Ardelia Mems ciruga?   Mejorar los niveles de azcar en la sangre antes y despus de [place] ayuda a la recuperacin y [employer].   Una forma de mejorar el control de los niveles de azcar es seguir una dieta saludable:  o Comiendo menos azcar y carbohidratos  o Aumentando la actividad y el ejercicio  o Hablar con su doctor sobre cmo lograr sus [employer] en cuanto a los niveles de Dispensing optician   Los niveles altos de Dispensing optician (superiores a 180 mg/dL) pueden aumentar el riesgo de infecciones y Geologist, engineering, por esta razn deber enfocarse en controlar su diabetes durante las semanas previas a la [place].   Asegrese de que el doctor(a) que le  atiende su diabetes est informado(a) de su ciruga programada, incluyendo la fecha y TEFL teacher.  Cmo controlar mi nivel de azcar en la sangre antes de la ciruga?   Revise su nivel de azcar en la sangre por lo menos 4 veces al da, comenzando 2 das antes de la [place], para asegurarse de que el nivel no est demasiado alto o bajo.  o Revise su nivel de azcar en la maana de la ciruga al levantarse y cada 2 horas hasta que llegue a la unidad de corta estada.   Si su nivel de azcar en la sangre es inferior a 70 mg/dL, necesitar tratamiento para el nivel bajo de azcar en la sangre:  o No se ponga insulina.  o En caso de que el nivel de azcar est bajo (inferior a 70 mg/dL) tome  vaso de jugo claro/transparente (arndano o Crocker), 4 tabletas de glucosa, O gel de glucosa.  o Vuelva a revisar el nivel de azcar a los 15 minutos despus del tratamiento (para asegurarse de que es superior a 70 mg/dL). Si su nivel de azcar no es superior a 70 mg/dL en la nueva revisin, llame al [phone] para recibir ms instrucciones.   Informe de su nivel de azcar a la enfermera de la unidad de corta estada cuando llegue all.   Si le ingresan al hospital despus de la ciruga:  o Su nivel de azcar ser revisado por el personal y Engineer, technical sales darn insulina despus de la ciruga (en lugar de medicamentos orales para la diabetes) para asegurarse de que tiene buenos niveles de Education officer, museum la sangre.  o El objetivo para el control de los niveles de Location manager en la sangre despus de la  ciruga es de 80-180 mg/dL.  QU HAGO CON MIS MEDICAMENTOS PARA LA DIABETES?   No tome medicamentos orales para la diabetes (pastillas) en la maana de la [place].   EN LA [place], take morning dose of Lantus as prescribed. Take  50 % of evening dose   EN LA [place] DE LA CIRUGA, take 50% of Lantus   El da de la ciruga, no utilice otros medicamentos inyectables, como Byetta  (exenatida), Bydureon (exenatida ER), Victoza (liraglutida), o Trulicity (dulaglutida).  Revisado por el Comit de educacin del paciente de [place], agosto 2015  [place] la mascarilla CPAP y los tubos el da de la [place].                              No debe trae ningn metal en el cuerpo, incluyendo pinzas para el cabello, joyas, ni aretes/pendientes             No use maquillaje, lociones/cremas, polvos, perfumes/colonias o desodorante  No use esmalte de uas, incluyendo los de gel ni S&S, uas artificiales/acrlicas o cualquier otro tipo de cobertura en las uas naturales, [place] y [employer]. Si tiene uas artificiales, con capas de gel, etc. que necesite que le quiten en un saln de uas, por favor, pida que se lo quiten antes de la ciruga o la ciruga podra ser cancelada/retrasada si el cirujano o el anestesilogo consideran que no puede ser monitoreado(a) de una forma segura.   No se rasure en las 48 horas antes de la operacin.               Los hombres pueden [employer] cara y el cuello.   No traiga objetos de valor al hospital. [place] NO SE HACE RESPONSABLE DE LOS OBJETOS DE VALOR.   Los contactos, las dentaduras o los puentes no se pueden usar durante la [place].   Sheela Stack una bolsa pequea para la noche el da de la [place].   NO TRAIGA AL HOSPITAL LOS MEDICAMENTOS QUE TOMA EN CASA . [place] EN SU LISTA [place]!    Los pacientes dados de alta el mismo da de la ciruga no podrn Air cabin crew a casa.  Es NECESARIO que alguien se quede con usted durante las primeras 24 horas despus de la anestesia.   Instrucciones especiales: Ane Payment copia de sus documentos de poder notarial y testamento vital el da de su ciruga si no los ha escaneado antes.              Por favor, lea las siguientes hojas informativas que le dieron: [facility] [place] [phone] Schneiders    If you received a COVID test during your pre-op visit  it is requested that you wear a mask when out in public, stay away from anyone that may not be feeling well and notify your surgeon if you develop symptoms. If you test positive for Covid or have been in contact with anyone that has tested positive in the last 10 days please notify you surgeon.    [place] - Preparing for Surgery Before surgery, you can play an important role.  Because skin is not sterile, your skin needs to be as free of germs as possible.  You can reduce the number of germs on your  skin by washing with CHG (chlorahexidine gluconate) soap before surgery.  CHG is an antiseptic cleaner which kills germs and bonds with the skin to continue killing germs even after washing. Please DO NOT use if you have an allergy to CHG or antibacterial soaps.  If your skin becomes reddened/irritated stop using the CHG and inform your nurse when you arrive at Short Stay. Do not shave (including legs and underarms) for at least 48 hours prior to the first CHG shower.  You may shave your face/neck.  Please follow these instructions carefully:  1.  Shower with CHG Soap the night before surgery and the  morning of surgery.  2.  If you choose to wash your hair, wash your hair first as usual with your normal  shampoo.  3.  After you shampoo, rinse your hair and body thoroughly to remove the shampoo.                             4.  Use CHG as you would any other liquid soap.  You can apply chg directly to the skin and wash.  Gently with a scrungie or clean washcloth.  5.  Apply the CHG Soap to your body ONLY FROM THE NECK DOWN.   Do   not use on face/ open                           Wound or open sores. Avoid contact with eyes, ears mouth and   genitals (private parts).                       Wash face,  Genitals (private parts) with your normal soap.             6.  Wash  thoroughly, paying special attention to the area where your    surgery  will be performed.  7.  Thoroughly rinse your body with warm water from the neck down.  8.  DO NOT shower/wash with your normal soap after using and rinsing off the CHG Soap.                9.  Pat yourself dry with a clean towel.            10.  Wear clean pajamas.            11.  Place clean sheets on your bed the night of your first shower and do not  sleep with pets. Day of Surgery : Do not apply any lotions/deodorants the morning of surgery.  Please wear clean clothes to the hospital/surgery center.  FAILURE TO FOLLOW THESE INSTRUCTIONS MAY RESULT IN THE CANCELLATION OF YOUR SURGERY  PATIENT SIGNATURE_________________________________  NURSE SIGNATURE__________________________________                           PREPARACIN PARA [place]                                            Preparing for Surgery  Debido a que la piel no est esterilizada, sta necesita estar lo ms libre de grmenes como sea posible.  Usted puede reducir el nmero de grmenes en la piel lavndose con el jabn de CHG (Chlorahexidine gluconate)  antes de la [place].  El CHG es un jabn antisptico el cual mata los grmenes y se une a la piel para continuar matando los grmenes incluso hasta despus de lavarse. POR FAVOR NO LO USE SI USTED TIENE ALERGIAS AL CHG.  SI LA PIEL SE IRRITA, DEJE DE USAR EL CHG.  NO SE RASURE DURANTE AL MENOS 12 HORAS ANTES DE LA PRIMERA DUCHA CON EL CHG. Siga estas instrucciones cuidadosamente:  Dchese la noche anterior a la [place] y de nuevo en la maana de la [place]. Si decide lavarse el cabello, lvelo con su champ normal primero. Enjuague el cabello y el cuerpo para quitarse el [place]. Use el CHG como lo hara con cualquier otro jabn lquido, usando una toallita o esponja vegetal o exfoliante. Aplique el CHG al cuerpo solamente DEL CUELLO PARA ABAJO.  No lo use cerca de los ojos o los genitales. No se lave con  su jabn normal despus de usar el CHG. Squese con una toalla limpia. Espere hasta la maana siguiente para aplicarse desodorantes, lociones, excepto en el da de la [place], NO SE APLIQUE LOCIONES. Use pijamas limpias o una bata. Coloque sbanas limpias en su cama la noche de su primera ducha - no duerma con mascotas. 10.  Use ropa limpia al venir al hospital.       ________________________________________________________________________  Adam [place]  An incentive spirometer is a tool that can help keep your lungs clear and active. This tool measures how well you are filling your lungs with each breath. Taking long deep breaths may help reverse or decrease the chance of developing breathing (pulmonary) problems (especially infection) following: A long period of time when you are unable to move or be active. BEFORE THE PROCEDURE  If the spirometer includes an indicator to show your best effort, your nurse or respiratory therapist will set it to a desired goal. If possible, sit up straight or lean slightly forward. Try not to slouch. Hold the incentive spirometer in an upright position. INSTRUCTIONS FOR USE  Sit on the edge of your bed if possible, or sit up as far as you can in bed or on a chair. Hold the incentive spirometer in an upright position. Breathe out normally. Place the mouthpiece in your mouth and seal your lips tightly around it. Breathe in slowly and as deeply as possible, raising the piston or the ball toward the top of the column. Hold your breath for 3-5 seconds or for as long as possible. Allow the piston or ball to fall to the bottom of the column. Remove the mouthpiece from your mouth and breathe out normally. Rest for a few seconds and repeat Steps 1 through 7 at least 10 times every 1-2 hours when you are awake. Take your time and take a few normal breaths between deep breaths. The spirometer may include an indicator to show your best effort. Use the indicator  as a goal to work toward during each repetition. After each set of 10 deep breaths, practice coughing to be sure your lungs are clear. If you have an incision (the cut made at the time of surgery), support your incision when coughing by placing a pillow or rolled up towels firmly against it. Once you are able to get out of bed, walk around indoors and cough well. You may stop using the incentive spirometer when instructed by your caregiver.  RISKS AND COMPLICATIONS Take your time so you do not get dizzy or light-headed. If you are in pain, you  may need to take or ask for pain medication before doing incentive spirometry. It is harder to take a deep breath if you are having pain. AFTER USE Rest and breathe slowly and easily. It can be helpful to keep track of a log of your progress. Your caregiver can provide you with a simple table to help with this. If you are using the spirometer at home, follow these instructions: [place] IF:  You are having difficultly using the spirometer. You have trouble using the spirometer as often as instructed. Your pain medication is not giving enough relief while using the spirometer. You develop fever of 100.5 F (38.1 C) or higher. SEEK IMMEDIATE MEDICAL CARE IF:  You cough up bloody sputum that had not been present before. You develop fever of 102 F (38.9 C) or greater. You develop worsening pain at or near the incision site. MAKE SURE YOU:  Understand these instructions. Will watch your condition. Will get help right away if you are not doing well or get worse. Document Released: 10/22/2006 Document Revised: 09/03/2011 Document Reviewed: 12/23/2006 [facility] Patient Information 2014 [place], [place].   Espirmetro de incentivo Database administrator video en casa: [email or web address])  Un espirmetro de incentivo es una herramienta que puede ayudarle a mantener los pulmones limpios y [place]. Esta herramienta mide la capacidad en  la que se llenan los pulmones con cada respiracin. Tomar respiraciones largas y profundas puede ayudar a revertir o disminuir la probabilidad de Engineering geologist respiratorios (pulmonares) (especialmente infecciones) a consecuencia de: Un largo perodo de [employer] no puede moverse o estar activo(a). ANTES DEL PROCEDIMIENTO  Si el espirmetro incluye un indicador para mostrar su mejor esfuerzo, su enfermera o el(la) terapeuta respiratorio(a) lo ajustar a una meta deseada. Si es posible, sintese derecho(a) o ligeramente inclinado(a) hacia adelante. Trate de no encorvarse. Sujete el espirmetro de incentive en posicin erguida. INSTRUCCIONES DE USO  Sintese en el borde de la cama si es posible, o sintese lo mejor que pueda en la cama o en una silla. Sujete el espirmetro de incentivo en posicin recta. Exhale el aire normalmente. Colquese la boquilla en la boca y cierre bien los labios alrededor de ella. Inspire de forma lenta y lo ms profundamente posible, elevando el pistn o la [employer] parte superior del cilindro. [place] le respiracin durante 3-5 segundos o tanto tiempo como sea posible. Deje que el pistn o la bolita caigan hasta la parte inferior del cilindro. Retire la boquilla de la boca y exhale normalmente. Descanse unos pocos segundos y repita los pasos del 1 al 7, al menos 10 veces cada 1-2 horas cuando est despierto(a). Tmese su tiempo y realice algunas respiraciones normales entre las respiraciones profundas. El espirmetro puede incluir un indicador para mostrar su mejor esfuerzo. Use el indicador como una meta a alcanzar por  cada respiracin. Despus de cada serie de 10 respiraciones, practique la tos para asegurarse de que sus pulmones estn limpios. Si tiene una incisin (el corte realizado al momento de la ciruga), sujete la incisin al toser colocando una almohada o una toalla enrollada firmemente contra ella. Cuando ya pueda levantarse de la cama,  camine dentro de la casa y [place]. Puede dejar de utilizar el espirmetro de incentivo cuando se lo indique el(la) que est a cargo de su cuidado.  RIESGOS Y COMPLICACIONES Tmese su tiempo para no marearse ni sentirse aturdido(a). Si tiene Social research officer, government, es posible que necesite tomar o pedir medicamento para Conservation officer, historic buildings antes de usar  el espirmetro de incentivo. Es ms difcil respirar profundamente si tiene dolor. DESPUS DEL USO Descanse y respire lenta y fcilmente. Puede ser til llevar un registro de su progreso. El(la) que est a cargo de su cuidado puede darle una tabla sencilla para ayudarle con esto. Si est utilizando Teacher, music, siga estas instrucciones: BUSQUE ATENCIN MDICA SI:  Est teniendo dificultad para Therapist, music. Tiene problemas para usar el espirmetro con la frecuencia que se le indic. Su medicamento para Conservation officer, historic buildings no le est aliviando lo suficiente cuando est usando el espirmetro. Tiene fiebre de 100.5 F (38.1 C) o ms. BUSQUE ATENCIN MDICA INMEDIATA SI:  Al toser expulse un esputo con sangre que no tena antes. Tiene fiebre de 102 F (38.9 C) o ms. Le empeora el dolor en el lugar de la incisin o cerca de l. ASEGRESE DE QUE:  Entiende estas instrucciones. Observar su condicin. Buscar ayuda de inmediato si no se siente bien o empeora. Documento publicado: 24/02/7352 Documento revisado: 09/03/2011 Documentp revisado: 12/23/2006 ExitCare Patient Information 2014 [employer].      ________________________________________________________________________ WHAT IS A BLOOD TRANSFUSION? Blood Transfusion Information  A transfusion is the replacement of blood or some of its parts. Blood is made up of multiple cells which provide different functions. Red blood cells carry oxygen and are used for blood loss replacement. White blood cells fight against infection. Platelets control bleeding. Plasma helps clot blood. Other blood products are  available for specialized needs, such as hemophilia or other clotting disorders. BEFORE THE TRANSFUSION  Who gives blood for transfusions?  Healthy volunteers who are fully evaluated to make sure their blood is safe. This is blood bank blood. Transfusion therapy is the safest it has ever been in the practice of medicine. Before blood is taken from a donor, a complete history is taken to make sure that person has no history of diseases nor engages in risky social behavior (examples are intravenous drug use or sexual activity with multiple partners). The donor's travel history is screened to minimize risk of transmitting infections, such as malaria. The donated blood is tested for signs of infectious diseases, such as HIV and hepatitis. The blood is then tested to be sure it is compatible with you in order to minimize the chance of a transfusion reaction. If you or a relative donates blood, this is often done in anticipation of surgery and is not appropriate for emergency situations. It takes many days to process the donated blood. RISKS AND COMPLICATIONS Although transfusion therapy is very safe and saves many lives, the main dangers of transfusion include:  Getting an infectious disease. Developing a transfusion reaction. This is an allergic reaction to something in the blood you were given. Every precaution is taken to prevent this. The decision to have a blood transfusion has been considered carefully by your caregiver before blood is given. Blood is not given unless the benefits outweigh the risks. AFTER THE TRANSFUSION Right after receiving a blood transfusion, you will usually feel much better and more energetic. This is especially true if your red blood cells have gotten low (anemic). The transfusion raises the level of the red blood cells which carry oxygen, and this usually causes an energy increase. The nurse administering the transfusion will monitor you carefully for complications. HOME CARE  INSTRUCTIONS  No special instructions are needed after a transfusion. You may find your energy is better. Speak with your caregiver about any limitations on activity for underlying diseases you may have.  SEEK MEDICAL CARE IF:  Your condition is not improving after your transfusion. You develop redness or irritation at the intravenous (IV) site. SEEK IMMEDIATE MEDICAL CARE IF:  Any of the following symptoms occur over the next 12 hours: Shaking chills. You have a temperature by mouth above 102 F (38.9 C), not controlled by medicine. Chest, back, or muscle pain. People around you feel you are not acting correctly or are confused. Shortness of breath or difficulty breathing. Dizziness and fainting. You get a rash or develop hives. You have a decrease in urine output. Your urine turns a dark color or changes to pink, red, or brown. Any of the following symptoms occur over the next 10 days: You have a temperature by mouth above 102 F (38.9 C), not controlled by medicine. Shortness of breath. Weakness after normal activity. The white part of the eye turns yellow (jaundice). You have a decrease in the amount of urine or are urinating less often. Your urine turns a dark color or changes to pink, red, or brown. Document Released: 06/08/2000 Document Revised: 09/03/2011 Document Reviewed: 01/26/2008 ExitCare Patient Information 2014 ExitCare, [place].  _______________________________________________________________________  [place]? Informacin sobre la transfusin de [place] transfusin es la sustitucin de [place] o de algunas de sus partes. La sangre est formada por mltiples clulas las cuales desempean diferentes funciones.   Los glbulos rojos transportan oxgeno y se [place] para Government social research officer perdida.   Trego.   Las plaquetas controlan el sangrado.   El plasma ayuda a Psychologist, sport and exercise.   Hay  otros productos sanguneos disponibles para necesidades especiales, como la hemofilia u otros trastornos de Air cabin crew.  ANTES DE LA COAGULACIN  [place] para transfusiones?   Voluntarios saludables que son evaluados a fondo para asegurarse de que su sangre es segura. Esta sangre procede de bancos de sangre.  La terapia de transfusin es la ms segura que ha existido en la prctica de Careers adviser. Antes de extraer sangre de un donante, se hace un historial completo para asegurarse de que esa persona no tiene antecedentes de enfermedades ni participa en conductas sociales de riesgo (entre los ejemplos est el uso de drogas intravenosas o actividad sexual con mltiples parejas). Se investiga el historial de viajes del donante para minimizar los riesgos de transmisin de infecciones, como la malaria/paludismo. La sangre donada se analiza para Hydrographic surveyor signos de enfermedades infecciosas, como el VIH y la hepatitis. Despus se analiza la sangre para asegurarse de que es compatible con usted, con el fin de minimizar la posibilidad de una reaccin a la transfusin. Si usted o un familiar suyo [employer], con frecuencia lo hacen antes de Clementeen Hoof y no es apropiado para situaciones de Freight forwarder. El proceso de la sangre donada dura muchos das.  RIESGOS Y COMPLICACIONES  Aunque la terapia de transfusiones es muy segura y salva muchas vidas, los principales peligros de la transfusin son:   Rollene Rotunda enfermedad infecciosa.   Desarrollar una reaccin a la transfusin. Esto es una reaccin alrgica a algo en la sangre que se le ha dado. Se toman todas las precauciones para evitar esto.  La decisin de recibir una transfusin de sangre ha sido considerada cuidadosamente por su cuidador antes de que se le d Herbalist. La sangre no se da a menos que los beneficios [employer].  DESPUS DE LA TRANSFUSIN   Inmediatamente despus de recibir una transfusin de [place],  normalmente se sentir mucho mejor y con ms energa. Esto es especialmente cierto si sus glbulos rojos estn bajos (anmico). La transfusin eleva el nivel de los glbulos rojos que transportan el oxgeno, y esto produce un aumento de Teacher, early years/pre.   La enfermera que le administre la transfusin le observar cuidadosamente para evitar complicaciones.  INSTRUCCIONES PARA LOS CUIDADOS EN CASA  No se necesitan instrucciones especiales despus de una transfusin. Puede notar que su energa mejora. Hable con su cuidador(a) sobre cualquier limitacin en la actividad en caso de enfermedades no diagnosticadas que pueda tener.  BUSQUE ATENCIN MDICA SI:   Su condicin no mejora despus de la transfusin.   Presenta enrojecimiento o irritacin en el lugar de la va intravenosa (IV).  BUSQUE ATENCIN MDICA INMEDIATA SI:  Se presentan alguno de los sntomas a continuacin en las siguientes 12 horas:   Escalofros.   Tiene una temperatura por va oral superior a 102 F (38.9 C), que no se controla con medicamentos.   Dolor de [place], de espalda o muscular.   Las personas a su alrededor sienten que usted no est actuando correctamente o est confuso(a).   [place] para respirar.   Mareos y [employer].   Erupcin cutnea o le salen ronchas.   Disminucin en la produccin de [place].   Su orina se vuelve de color oscuro o cambia a color rosado, rojo o marrn.  Se presentan alguno de los sntomas a continuacin en los 10 das siguientes:   Tiene una temperatura por va oral superior a 102 F (38.9 C), que no se controla con medicamentos.   Dificultad para respirar.   Debilidad despus de una actividad normal.   La parte blanca del ojo se vuelve amarilla (ictericia).   Disminucin en la produccin de [place] u orina con menos frecuencia.   Su orina se vuelve de color oscuro o cambia a color rosado, rojo o marrn.  Documento publicado: 91/47/8295 Documento  revisado: 09/03/2011 Documento revisado: 01/26/2008  ExitCare Patient Information 2014 [place], [place].

## 2022-07-05 NOTE — Progress Notes (Addendum)
Interview completed with interpreter present  COVID Vaccine Completed:yes  Date of COVID positive in last 90 days: no  PCP - Arthur Holms, NP Cardiologist - Buford Dresser, MD Nephrologist- Pearson Grippe, MD  Chest x-ray - 03/29/22 [facility] EKG - 08/08/21 Epic Stress Test - 11/24/20 Epic ECHO - 03/13/22 [facility] Cardiac Cath - 03/04/19 Epic Pacemaker/ICD device last checked: n/a Spinal Cord Stimulator: n/a  Bowel Prep - light diet day before surgery  Sleep Study - yes CPAP - once or twice a week  Fasting Blood Sugar - 50-140 Checks Blood Sugar 1 times a day  Last dose of GLP1 agonist-  Mounjaro GLP1 instructions:  N/A   Last dose of SGLT-2 inhibitors-  N/A SGLT-2 instructions: N/A   Blood Thinner Instructions: Aspirin Instructions: ASA 81, hold 3 days Last Dose:  Activity level: Can go up a flight of stairs and perform activities of daily living without stopping and without symptoms of chest pain or shortness of breath. Gets fatigued easily    Anesthesia review: HTN, aortic atherosclerosis, ESRD with kidney transplant, thrombocytopenia, DM 1, OSA, mitral valve insufficiency, creatinine 1.72  Patient denies shortness of breath, fever, cough and chest pain at PAT appointment  Patient verbalized understanding of instructions that were given to them at the PAT appointment. Patient was also instructed that they will need to review over the PAT instructions again at home before surgery.

## 2022-07-06 ENCOUNTER — Inpatient Hospital Stay (HOSPITAL_BASED_OUTPATIENT_CLINIC_OR_DEPARTMENT_OTHER): Payer: Medicare HMO | Admitting: Gynecologic Oncology

## 2022-07-06 ENCOUNTER — Inpatient Hospital Stay: Payer: Medicare HMO | Attending: Hematology | Admitting: Gynecologic Oncology

## 2022-07-06 ENCOUNTER — Telehealth: Payer: Self-pay | Admitting: Surgery

## 2022-07-06 ENCOUNTER — Encounter: Payer: Self-pay | Admitting: Gynecologic Oncology

## 2022-07-06 ENCOUNTER — Other Ambulatory Visit: Payer: Self-pay

## 2022-07-06 VITALS — BP 140/67 | HR 83 | Temp 98.0°F | Resp 14 | Wt 168.1 lb

## 2022-07-06 DIAGNOSIS — Z7189 Other specified counseling: Secondary | ICD-10-CM | POA: Diagnosis not present

## 2022-07-06 DIAGNOSIS — Z1509 Genetic susceptibility to other malignant neoplasm: Secondary | ICD-10-CM | POA: Insufficient documentation

## 2022-07-06 DIAGNOSIS — Z1502 Genetic susceptibility to malignant neoplasm of ovary: Secondary | ICD-10-CM | POA: Diagnosis not present

## 2022-07-06 DIAGNOSIS — Z1501 Genetic susceptibility to malignant neoplasm of breast: Secondary | ICD-10-CM | POA: Insufficient documentation

## 2022-07-06 DIAGNOSIS — Z148 Genetic carrier of other disease: Secondary | ICD-10-CM | POA: Diagnosis present

## 2022-07-06 DIAGNOSIS — C50312 Malignant neoplasm of lower-inner quadrant of left female breast: Secondary | ICD-10-CM | POA: Diagnosis not present

## 2022-07-06 DIAGNOSIS — Z170 Estrogen receptor positive status [ER+]: Secondary | ICD-10-CM | POA: Diagnosis not present

## 2022-07-06 DIAGNOSIS — E119 Type 2 diabetes mellitus without complications: Secondary | ICD-10-CM

## 2022-07-06 DIAGNOSIS — Z940 Kidney transplant status: Secondary | ICD-10-CM

## 2022-07-06 MED ORDER — SENNOSIDES-DOCUSATE SODIUM 8.6-50 MG PO TABS: 2.0000 | ORAL_TABLET | Freq: Every day | ORAL | 0 refills | Status: DC

## 2022-07-06 NOTE — Telephone Encounter (Signed)
Called patient to let her know she needs to stop her Tamoxifen today in preparation for surgery. Our office will speak with Dr Burr Medico regarding when it is ok to resume medication after surgery. Patient verbalized understanding and had no other concerns at this time.

## 2022-07-06 NOTE — Patient Instructions (Addendum)
Preparing for your Surgery   Plan for surgery on July 11, 2022 with Dr. Jeral Pinch at [place] will be scheduled for robotic assisted laparoscopic bilateral salpingo-oophorectomy (removal of both ovaries and fallopian tubes), possible staging (if a cancer is identified), possible laparotomy (larger incision on your abdomen).    We will find out from Dr. Burr Medico if and when you need to stop taking your tamoxifen before surgery and when to resume after surgery.  Do not take Mounjaro 7 days before surgery.   Pre-operative Testing -You will receive a phone call from presurgical testing at [facility] to arrange for a pre-operative appointment and lab work.   -Bring your insurance card, copy of an advanced directive if applicable, medication list   -At that visit, you will be asked to sign a consent for a possible blood transfusion in case a transfusion becomes necessary during surgery.  The need for a blood transfusion is rare but having consent is a necessary part of your care.      -YOUR LAST DOSE OF BABY ASPIRIN WILL BE THE DAY BEFORE SURGERY. DO NOT TAKE THIS THE MORNING OF SURGERY.   -Do not take supplements such as fish oil (omega 3), red yeast rice, turmeric before your surgery. You want to avoid medications with aspirin in them including headache powders such as BC or Goody's), Excedrin migraine.   Day Before Surgery at [place] will be asked to take in a light diet the day before surgery. You will be advised you can have clear liquids up until 3 hours before your surgery.     Eat a light diet the day before surgery.  Examples including soups, broths, toast, yogurt, mashed potatoes.  AVOID GAS PRODUCING FOODS. Things to avoid include carbonated beverages (fizzy beverages, sodas), raw fruits and raw vegetables (uncooked), or beans.    If your bowels are filled with gas, your surgeon will have difficulty visualizing your pelvic organs which increases your  surgical risks.   Your role in recovery Your role is to become active as soon as directed by your doctor, while still giving yourself time to heal.  Rest when you feel tired. You will be asked to do the following in order to speed your recovery:   - Cough and breathe deeply. This helps to clear and expand your lungs and can prevent pneumonia after surgery.  - [place]. Do mild physical activity. Walking or moving your legs help your circulation and body functions return to normal. Do not try to get up or walk alone the first time after surgery.   -If you develop swelling on one leg or the other, pain in the back of your leg, redness/warmth in one of your legs, please call the office or go to the Emergency Room to have a doppler to rule out a blood clot. For shortness of breath, chest pain-seek care in the Emergency Room as soon as possible. - Actively manage your pain. Managing your pain lets you move in comfort. We will ask you to rate your pain on a scale of zero to 10. It is your responsibility to tell your doctor or nurse where and how much you hurt so your pain can be treated.   Special Considerations -If you are diabetic, you may be placed on insulin after surgery to have closer control over your blood sugars to promote healing and recovery.  This does not mean that you will be discharged on  insulin.  If applicable, your oral antidiabetics will be resumed when you are tolerating a solid diet.   -Your final pathology results from surgery should be available around one week after surgery and the results will be relayed to you when available.   -FMLA forms can be faxed to [phone] and please allow 5-7 business days for completion.   Pain Management After Surgery -You have been prescribed your bowel regimen medications before surgery so that you can have these available when you are discharged from the hospital.  -YOU CAN USE THE OXYCODONE YOU HAVE AT HOME FOR PAIN. IF  YOU NEED A REFILL CONTACT THE OFFICE.   -Make sure that you have Tylenol at home to use on a regular basis after surgery for pain control.    -Review the attached handout on narcotic use and their risks and side effects.    Bowel Regimen -You have been prescribed Sennakot-S to take nightly to prevent constipation especially if you are taking the narcotic pain medication intermittently.  It is important to prevent constipation and drink adequate amounts of liquids. You can stop taking this medication when you are not taking pain medication and you are back on your normal bowel routine.   Risks of Surgery Risks of surgery are low but include bleeding, infection, damage to surrounding structures, re-operation, blood clots, and very rarely death.     Blood Transfusion Information (For the consent to be signed before surgery)   We will be checking your blood type before surgery so in case of emergencies, we will know what type of blood you would need.                                             WHAT IS A BLOOD TRANSFUSION?   A transfusion is the replacement of blood or some of its parts. Blood is made up of multiple cells which provide different functions. Red blood cells carry oxygen and are used for blood loss replacement. White blood cells fight against infection. Platelets control bleeding. Plasma helps clot blood. Other blood products are available for specialized needs, such as hemophilia or other clotting disorders. BEFORE THE TRANSFUSION  Who gives blood for transfusions?  You may be able to donate blood to be used at a later date on yourself (autologous donation). Relatives can be asked to donate blood. This is generally not any safer than if you have received blood from a stranger. The same precautions are taken to ensure safety when a relative's blood is donated. Healthy volunteers who are fully evaluated to make sure their blood is safe. This is blood bank blood. Transfusion  therapy is the safest it has ever been in the practice of medicine. Before blood is taken from a donor, a complete history is taken to make sure that person has no history of diseases nor engages in risky social behavior (examples are intravenous drug use or sexual activity with multiple partners). The donor's travel history is screened to minimize risk of transmitting infections, such as malaria. The donated blood is tested for signs of infectious diseases, such as HIV and hepatitis. The blood is then tested to be sure it is compatible with you in order to minimize the chance of a transfusion reaction. If you or a relative donates blood, this is often done in anticipation of surgery and is not appropriate for emergency situations.  It takes many days to process the donated blood. RISKS AND COMPLICATIONS Although transfusion therapy is very safe and saves many lives, the main dangers of transfusion include:  Getting an infectious disease. Developing a transfusion reaction. This is an allergic reaction to something in the blood you were given. Every precaution is taken to prevent this. The decision to have a blood transfusion has been considered carefully by your caregiver before blood is given. Blood is not given unless the benefits outweigh the risks.   AFTER SURGERY INSTRUCTIONS   Return to work: 4-6 weeks if applicable   Activity: 1. Be up and out of the bed during the day.  Take a nap if needed.  You may walk up steps but be careful and use the hand rail.  Stair climbing will tire you more than you think, you may need to stop part way and rest.    2. No lifting or straining for 6 weeks over 10 pounds. No pushing, pulling, straining for 6 weeks.   3. No driving for around 1 week(s).  Do not drive if you are taking narcotic pain medicine and make sure that your reaction time has returned.    4. You can shower as soon as the next day after surgery. Shower daily.  Use your regular soap and water  (not directly on the incision) and pat your incision(s) dry afterwards; don't rub.  No tub baths or submerging your body in water until cleared by your surgeon. If you have the soap that was given to you by pre-surgical testing that was used before surgery, you do not need to use it afterwards because this can irritate your incisions.    5. No sexual activity and nothing in the vagina for 4 weeks.   6. You may experience a small amount of clear drainage from your incisions, which is normal.  If the drainage persists, increases, or changes color please call the office.   7. Do not use creams, lotions, or ointments such as neosporin on your incisions after surgery until advised by your surgeon because they can cause removal of the dermabond glue on your incisions.     8. You may experience vaginal spotting after surgery.  The spotting is normal but if you experience heavy bleeding, call our office.   9. Take Tylenol first for pain and only use narcotic pain medication for severe pain not relieved by the Tylenol.  Monitor your Tylenol intake to a max of 4,000 mg in a 24 hour period.    Diet: 1. Low sodium Heart Healthy Diet is recommended but you are cleared to resume your normal (before surgery) diet after your procedure.   2. It is safe to use a laxative, such as Miralax or Colace, if you have difficulty moving your bowels. You have been prescribed Sennakot-S to take at bedtime every evening after surgery to keep bowel movements regular and to prevent constipation.     Wound Care: 1. Keep clean and dry.  Shower daily.   Reasons to call the Doctor: Fever - Oral temperature greater than 100.4 degrees Fahrenheit Foul-smelling vaginal discharge Difficulty urinating Nausea and vomiting Increased pain at the site of the incision that is unrelieved with pain medicine. Difficulty breathing with or without chest pain New calf pain especially if only on one side Sudden, continuing increased vaginal  bleeding with or without clots.   Contacts: For questions or concerns you should contact:   Dr. Jeral Pinch at [phone]   [facility]  Chantavia Bazzle, NP at [phone]   After Hours: call [phone] and have the GYN Oncologist paged/contacted (after 5 pm or on the weekends).   Messages sent via mychart are for non-urgent matters and are not responded to after hours so for urgent needs, please call the after hours number.

## 2022-07-06 NOTE — Progress Notes (Signed)
Gynecologic Oncology Return Clinic Visit  07/06/22  Reason for Visit: Treatment planning  Treatment History: Oncology History Overview Note  Cancer Staging Malignant neoplasm of lower-inner quadrant of left breast in female, estrogen receptor positive ([place]) Staging form: Breast, AJCC 8th Edition - Clinical stage from 02/09/2021: Stage IA (cT1c, cN0, cM0, G2, ER+, PR+, HER2-) - Signed by Truitt Merle, MD on 02/20/2021 Stage prefix: Initial diagnosis Histologic grading system: 3 grade system - Pathologic stage from 03/02/2021: Stage IA (pT2, pN0, cM0, G2, ER+, PR+, HER2-, Oncotype DX score: 26) - Signed by Truitt Merle, MD on 03/21/2021 Stage prefix: Initial diagnosis Multigene prognostic tests performed: Oncotype DX Recurrence score range: Greater than or equal to 11 Histologic grading system: 3 grade system Residual tumor (R): R0 - None    Malignant neoplasm of lower-inner quadrant of left breast in female, estrogen receptor positive ([place])  02/07/2021 Mammogram   Exam: Left Diagnostic Mammogram; Left Breast Ultrasound  IMPRESSION: Irregular mass in left breast at 6:30, measuring 1.6 cm by mammogram, is highly suggestive of malignancy. Left axillary ultrasound is negative for lymphadenopathy.   02/09/2021 Cancer Staging   Staging form: Breast, AJCC 8th Edition - Clinical stage from 02/09/2021: Stage IA (cT1c, cN0, cM0, G2, ER+, PR+, HER2-) - Signed by Truitt Merle, MD on 02/20/2021 Stage prefix: Initial diagnosis Histologic grading system: 3 grade system   02/09/2021 Pathology Results   Diagnosis Breast, left, needle core biopsy, 6:30 o'clock 8cm fn - INVASIVE DUCTAL CARCINOMA WITH HISTIOCYTOID FEATURES. SEE NOTE Diagnosis Note Carcinoma measures 1 cm in greatest linear dimension and appears grade 2.  PROGNOSTIC INDICATORS Results: IMMUNOHISTOCHEMICAL AND MORPHOMETRIC ANALYSIS PERFORMED MANUALLY The tumor cells are EQUIVOCAL for Her2 (2+). Her2 by FISH will be performed and results performed  separately. Estrogen Receptor: >95%, POSITIVE, STRONG STAINING INTENSITY Progesterone Receptor: 90%, POSITIVE, STRONG STAINING INTENSITY Proliferation Marker Ki67: 20%  FLUORESCENCE IN-SITU HYBRIDIZATION Results: GROUP 5: HER2 **NEGATIVE** Equivocal form of amplification of the HER2 gene was detected in the IHC 2+ tissue sample received from this individual. HER2 FISH was performed by a technologist and cell imaging and analysis on the [employer]. RATIO OF HER2/CEN17 SIGNALS 1.30 AVERAGE HER2 COPY NUMBER PER CELL 1.75   02/15/2021 Initial Diagnosis   Malignant neoplasm of lower-inner quadrant of left breast in female, estrogen receptor positive ([place])   03/01/2021 Genetic Testing   Positive genetic testing: pathogenic variant detected in BRCA2 at [id number].  No other pathogenic variants detected in [facility] CustomNext Panel.  The report date is 03/19/2021.  The CustomNext-Cancer+RNAinsight panel offered by [facility] includes sequencing and rearrangement analysis for the following 47 genes:  APC, ATM, AXIN2, BARD1, BMPR1A, BRCA1, BRCA2, BRIP1, CDH1, CDK4, CDKN2A, CHEK2, DICER1, EPCAM, GREM1, HOXB13, MEN1, MLH1, MSH2, MSH3, MSH6, MUTYH, NBN, NF1, NF2, NTHL1, PALB2, PMS2, POLD1, POLE, PTEN, RAD51C, RAD51D, RECQL, RET, SDHA, SDHAF2, SDHB, SDHC, SDHD, SMAD4, SMARCA4, STK11, TP53, TSC1, TSC2, and VHL.  RNA data is routinely analyzed for use in variant interpretation for all genes.   03/02/2021 Cancer Staging   Staging form: Breast, AJCC 8th Edition - Pathologic stage from 03/02/2021: Stage IA (pT2, pN0, cM0, G2, ER+, PR+, HER2-, Oncotype DX score: 26) - Signed by Truitt Merle, MD on 03/21/2021 Stage prefix: Initial diagnosis Multigene prognostic tests performed: Oncotype DX Recurrence score range: Greater than or equal to 11 Histologic grading system: 3 grade system Residual tumor (R): R0 - None   05/02/2021 - 07/12/2021 Chemotherapy   Patient is on Treatment Plan : BREAST TC q21d  08/22/2021  Pathology Results   FINAL MICROSCOPIC DIAGNOSIS:   A. BREAST, LEFT, MASTECTOMY:  - Fibrocystic changes.  - Lumpectomy scar with hemosiderin deposition.  - No malignancy identified.   B. BREAST, RIGHT, MASTECTOMY:  - Fibrocystic changes with focal fibroadenomatoid change.  - No malignancy identified.     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 She has completed adjuvant chemotherapy.  She is scheduled to have bilateral mastectomies next week.  Genetic testing revealed that she has a BRCA2 mutation.  From an oncologic standpoint, plan will be for postoperative aromatase inhibitor given menopausal status and ER+ cancer.  Her most recent [facility] was consistent with menopause (52).   Patient reports overall doing well.  She notes appetite has been up and down.  She has gained some weight recently.  She has about a week of abdominal symptoms including intermittent bloating and feeling gassy as well as nausea.  She describes some baseline epigastric abdominal pain which has been worse for the last week, describes intermittent radiation to the right side of her upper abdomen.  Notes being constipated at baseline, no recent change.  Has intermittent bilateral pelvic pain.  She also endorses symptoms consistent with both stress and urge urinary incontinence.  Patient has been seen by urogynecology, most recently in 2020 for diagnosis of overactive bladder.  At that point, she was started on trospium.   Patient's GYN history is notable for hysterectomy in 2001 which it sounds like was performed for uterine fibroids.  Patient thinks that both fallopian tubes and ovaries were left in situ.  Patient remembers having menopausal symptoms around her mid 54s.  Patient thinks she may have had an abnormal Pap smear about 5 years ago with a repeat normal Pap smear.  The only Pap test that I see in our system is from 2014.  This Pap was negative, high risk HPV not  detected.  Interval History: Reports overall doing well.  Notes some intermittent baseline pelvic pain.  Denies vaginal bleeding or discharge.  Reports baseline constipation.  CBGs have been running 50s-90s at home.  Past Medical/Surgical History: Past Medical History:  Diagnosis Date   Allergy    pollen   Anemia    Anxiety    BRCA2 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 03/01/2021   Breast cancer [facility])    Cataract    surgical repair bilateral   Complication of anesthesia    not herself for 2 days after last surgery (brest lumpectomy)   Coronary artery disease    Depression    ESRD on hemodialysis ([place])    Home HD 5x per week- not on dialysis now had tramsplant 4/17   Family history of ovarian cancer 02/22/2021   GERD (gastroesophageal reflux disease)    Hearing loss 2017   right ear   History of blood transfusion    transfusion reaction   Hyperlipidemia    Hypertension    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 per pt-dr levy also II; Per patient 08/18/21 she is Type 2   Kidney transplant recipient    Neuromuscular disorder ([place])    NEUROPATHY   Sleep apnea     Past Surgical History:  Procedure Laterality Date   ABDOMINAL HYSTERECTOMY     BREAST EXCISIONAL BIOPSY Right 08/2015   BREAST LUMPECTOMY WITH RADIOACTIVE SEED LOCALIZATION Right 09/14/2015   Procedure: RIGHT BREAST LUMPECTOMY WITH RADIOACTIVE SEED LOCALIZATION;  Surgeon: Donnie Mesa, MD;  Location: [place];  Service: General;  Laterality: Right;   BREAST LUMPECTOMY WITH RADIOACTIVE SEED LOCALIZATION Left 03/02/2021   Procedure: LEFT BREAST SEED LUMPECTOMY LEFT SENTINEL LYMPH NODE [place];  Surgeon: Erroll Luna, MD;  Location: [place];  Service: General;  Laterality: Left;  GEN AND PEC BLOCK   BREAST SURGERY Bilateral    biopsy bilateral   CARDIAC CATHETERIZATION     CATARACT EXTRACTION Bilateral    bilateral    CHOLECYSTECTOMY     CYST REMOVAL NECK     DIALYSIS FISTULA CREATION Left    EYE SURGERY Bilateral    lazer   kidney transplant     LEFT HEART CATH AND CORONARY ANGIOGRAPHY N/A 03/04/2019   Procedure: LEFT HEART CATH AND CORONARY ANGIOGRAPHY;  Surgeon: Martinique, Peter M, MD;  Location: [place] CV LAB;  Service: Cardiovascular;  Laterality: N/A;   LEFT HEART CATHETERIZATION WITH CORONARY ANGIOGRAM N/A 03/30/2014   Procedure: LEFT HEART CATHETERIZATION WITH CORONARY ANGIOGRAM;  Surgeon: Sinclair Grooms, MD;  Location: [facility] CATH LAB;  Service: Cardiovascular;  Laterality: N/A;   LIPOMA EXCISION N/A 02/06/2017   Procedure: EXCISION POSTERIOR NECK SEBACEOUS CYST;  Surgeon: Coralie Keens, MD;  Location: [place];  Service: General;  Laterality: N/A;   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   Procedure: REPAIR OF ARTERIOVENOUS FISTULA ANEURYSM;  Surgeon: Serafina Mitchell, MD;  Location: [facility] OR;  Service: Vascular;  Laterality: Left;   REVISON OF ARTERIOVENOUS FISTULA Left 09/28/2013   Procedure: EXCISE ESCHAR LEFT ARM  ARTERIOVENOUS FISTULA WITH PLICATION OF LEFT ARM ARTERIOVENOUS FISTULA;  Surgeon: Elam Dutch, MD;  Location: [facility] OR;  Service: Vascular;  Laterality: Left;   REVISON OF ARTERIOVENOUS FISTULA Left 08/26/2015   Procedure: RESECTION ANEURYSM OF LEFT ARM ARTERIOVENOUS FISTULA  ;  Surgeon: Serafina Mitchell, MD;  Location: [place];  Service: Vascular;  Laterality: Left;   SIMPLE MASTECTOMY WITH AXILLARY SENTINEL NODE BIOPSY Bilateral 08/22/2021   Procedure: BILATERAL SIMPLE MASTECTOMY;  Surgeon: Erroll Luna, MD;  Location: [place];  Service: General;  Laterality: Bilateral;   TUBAL LIGATION      Family History  Problem Relation Age of Onset   Diabetes Mother    Kidney disease Mother    Diabetes Father    Heart disease Father    Kidney disease Father    Hypertension Father    Diabetes Sister    Asthma Sister    Lupus Sister    Diabetes Brother    Diabetes  Brother    Diabetes Brother    Diabetes Brother    Cancer Maternal Grandmother 49       ovarian cancer   Ovarian cancer Maternal Grandmother    Diabetes Maternal Grandfather    Diabetes Paternal Grandmother    Colon cancer Neg Hx    Colon polyps Neg Hx    Esophageal cancer Neg Hx    Rectal cancer Neg Hx    Stomach cancer Neg Hx    Breast cancer Neg Hx    Endometrial cancer Neg Hx    Pancreatic cancer Neg Hx    Prostate cancer Neg Hx     Social History   Socioeconomic History   Marital status: Legally Separated    Spouse name: Not on file   Number of children: 3   Years of education: 9   Highest education level: Not on file  Occupational History   Occupation: disabled  Tobacco Use  Smoking status: Never   Smokeless tobacco: Never  Vaping Use   Vaping Use: Never used  Substance and Sexual Activity   Alcohol use: Not Currently    Comment: Socially   Drug use: No   Sexual activity: Not Currently    Birth control/protection: None, Surgical  Other Topics Concern   Not on file  Social History Narrative   Lives in home with daughters, grand daughter, son-in-law   Caffeine use - 1 cup coffee sometimes    Social Determinants of Health   Financial Resource Strain: High Risk (02/22/2021)   Overall Financial Resource Strain (CARDIA)    Difficulty of Paying Living Expenses: Hard  Food Insecurity: Food Insecurity Present (02/22/2021)   Hunger Vital Sign    Worried About Running Out of Food in the Last Year: Sometimes true    Ran Out of Food in the Last Year: Sometimes true  Transportation Needs: No Transportation Needs (02/22/2021)   PRAPARE - Hydrologist (Medical): No    Lack of Transportation (Non-Medical): No  Physical Activity: Not on file  Stress: Not on file  Social Connections: Not on file    Current Medications:  Current Outpatient Medications:    Accu-Chek Softclix Lancets lancets, Use as instructed (Patient taking differently: 1  each by Other route See admin instructions. Use as instructed), Disp: 100 each, Rfl: 12   acetaminophen (TYLENOL) 500 MG tablet, Take 500 mg by mouth every 6 (six) hours as needed for mild pain, fever or headache., Disp: , Rfl:    acidophilus (RISAQUAD) CAPS capsule, Take 1 capsule by mouth daily., Disp: , Rfl:    aspirin EC 81 MG EC tablet, Take 1 tablet (81 mg total) by mouth daily. (Patient taking differently: Take 81 mg by mouth at bedtime.), Disp: 90 tablet, Rfl: 3   Blood Glucose Monitoring Suppl (ACCU-CHEK AVIVA CONNECT) w/Device KIT, Check sugar 1x daily (Patient taking differently: 1 each by Other route See admin instructions. Check sugar 1x daily), Disp: 1 kit, Rfl: 0   carvedilol (COREG) 25 MG tablet, Take 25 mg by mouth 2 (two) times daily with a meal., Disp: , Rfl:    cetirizine (ZYRTEC) 10 MG tablet, Take 10 mg by mouth daily as needed for allergies., Disp: , Rfl:    cholecalciferol (VITAMIN D3) 25 MCG (1000 UNIT) tablet, Take 1,000 Units by mouth daily., Disp: , Rfl:    cyanocobalamin 1000 MCG tablet, Take 1,000 mcg by mouth daily., Disp: , Rfl:    DROPLET PEN NEEDLES 31G X 8 MM MISC, , Disp: , Rfl:    escitalopram (LEXAPRO) 10 MG tablet, Take 10 mg by mouth daily., Disp: , Rfl:    furosemide (LASIX) 40 MG tablet, Take 40 mg by mouth 2 (two) times daily as needed for fluid., Disp: , Rfl:    gabapentin (NEURONTIN) 300 MG capsule, Take 1 capsule (300 mg total) by mouth 2 (two) times daily. Start at 1 cap once daily, and increase to twice daily if she tolerates. Continue '600mg'$  at night (Patient taking differently: Take 300-600 mg by mouth See admin instructions. Take 300 mg by mouth in the morning and 600 mg at night), Disp: 60 capsule, Rfl: 0   Garlic 824 MG TABS, Take 100 mg by mouth daily., Disp: , Rfl:    glucose blood (ACCU-CHEK AVIVA PLUS) test strip, Check sugar 1x daily (Patient taking differently: 1 each by Other route See admin instructions. Check sugar 1x daily), Disp: 100 each,  Rfl:  12   hydrALAZINE (APRESOLINE) 25 MG tablet, Take 25 mg by mouth 2 (two) times daily., Disp: , Rfl:    insulin glargine (LANTUS SOLOSTAR) 100 UNIT/ML Solostar Pen, Inject 25-30 Units into the skin See admin instructions. Inject 30 units into the skin in the morning and 25 units at bedtime, Disp: , Rfl:    Insulin Pen Needle (PEN NEEDLES) 30G X 8 MM MISC, 1 each by Does not apply route daily. E11.9, Disp: 90 each, Rfl: 0   magnesium oxide (MAG-OX) 400 MG tablet, Take 400 mg by mouth 2 (two) times daily., Disp: , Rfl:    multivitamin-lutein (OCUVITE-LUTEIN) CAPS capsule, Take 1 capsule by mouth daily., Disp: , Rfl:    mycophenolate (MYFORTIC) 180 MG EC tablet, Take 360 mg by mouth 2 (two) times daily., Disp: , Rfl:    oxyCODONE (OXY IR/ROXICODONE) 5 MG immediate release tablet, Take 1 tablet (5 mg total) by mouth every 6 (six) hours as needed for severe pain. For AFTER surgery only, do not take and drive (Patient taking differently: Take 5 mg by mouth 2 (two) times daily as needed for severe pain.), Disp: 15 tablet, Rfl: 0   pantoprazole (PROTONIX) 40 MG tablet, Take 40 mg by mouth 2 (two) times daily., Disp: , Rfl: 5   predniSONE (DELTASONE) 5 MG tablet, Take 5 mg by mouth daily. , Disp: , Rfl: 2   rosuvastatin (CRESTOR) 5 MG tablet, Take 5 mg by mouth at bedtime., Disp: , Rfl:    tamoxifen (NOLVADEX) 20 MG tablet, Take 1 tablet (20 mg total) by mouth daily., Disp: 30 tablet, Rfl: 3   tirzepatide (MOUNJARO) 12.5 MG/0.5ML Pen, Inject 12.5 mg into the skin once a week., Disp: , Rfl:    senna-docusate (SENOKOT-S) 8.6-50 MG tablet, Take 2 tablets by mouth at bedtime. For AFTER surgery, do not take if having diarrhea, Disp: 30 tablet, Rfl: 0  Current Facility-Administered Medications:    dextrose 5 % solution, , Intravenous, Once, Mansouraty, Telford Nab., MD  Review of Systems: + Fatigue, hearing loss, palpitations, bloating, constipation, frequency, incontinence, hot flashes, pelvic pain, joint  pain, back pain, muscle pain, dizziness, numbness, depression. Denies appetite changes, fevers, chills, unexplained weight changes. Denies neck lumps or masses, mouth sores, ringing in ears or voice changes. Denies cough or wheezing.  Denies shortness of breath. Denies chest pain. Denies leg swelling. Denies abdominal pain, blood in stools, diarrhea, nausea, vomiting, or early satiety. Denies pain with intercourse, dysuria, hematuria. Denies vaginal bleeding or vaginal discharge.   Denies itching, rash, or wounds. Denies headaches or seizures. Denies swollen lymph nodes or glands, denies easy bruising or bleeding. Denies anxiety, confusion, or decreased concentration.  Physical Exam: BP (!) 140/67 (BP Location: Right Arm, Patient Position: Sitting)   Pulse 83   Temp 98 F (36.7 C) (Oral)   Resp 14   Wt 168 lb 1.6 oz (76.2 kg)   SpO2 98%   BMI 31.76 kg/m  General: Alert, oriented, no acute distress. HEENT: Normocephalic, atraumatic, sclera anicteric. Chest: Clear to auscultation bilaterally.  No wheezes or rhonchi. Cardiovascular: Regular rate and rhythm, no murmurs. Extremities: Grossly normal range of motion.  Warm, well perfused.  No edema bilaterally.  Laboratory & Radiologic Studies: Component Ref Range & Units 2 mo ago 10 mo ago  Cancer Antigen (CA) 125 0.0 - 38.1 U/mL 6.6 9.1 CM   Pelvic ultrasound 05/24/22:  There is a 1.7 cm simple cyst within the left ovary. Surgically absent uterus.  Assessment & Plan:  Cynthia [name] is a 52 y.o. woman with ER+ breast cancer and BRCA2 mutation.   Patient represents to discuss surgery again.  Diabetes is under much better control now. Hemoglobin A1c at the beginning of October was 7.9%. Most recent A1c was 6.4%.  Reviewed most recent pelvic ultrasound which showed a simple cyst on the left ovary and Ca1 25 which was normal.  We previously discussed risk reduction in the setting of BRCA mutation as well as therapeutic BSO in the  setting of her estrogen receptor positive breast cancer.  I reviewed again today the significant decrease in risk related to ovarian and fallopian tube cancer with a bilateral salpingo-oophorectomy.  We discussed that there still remains a small risk of primary peritoneal cancer.    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 We discussed the risk given location of the transplant kidney in her right pelvis. She has had a CT in the last few years with details the anatomy of her transplanted kidney and ureter. I would not proceed with removal of the right adnexa if I thought there was any significant risk of damaging her transplant.    The plan is for a robotic assisted bilateral salpingo-oophorectomy, possible staging, possible laparotomy. We discussed the small but present risk of incidental cancer at the time of risk-reducing surgery for BRCA mutations.  In the setting of concern for cancer at the time of surgery, I would plan to send biopsy to pathology for frozen section.  If malignancy encountered, we discussed additional staging procedures to include total hysterectomy, peritoneal biopsies, lymph node sampling, and omentectomy. The risks of surgery were discussed in detail and she understands these to include infection; wound separation; hernia; vaginal cuff separation, injury to adjacent organs such as bowel, bladder, blood vessels, ureters and nerves; bleeding which may require blood transfusion; anesthesia risk; thromboembolic events; possible death; unforeseen complications; possible need for re-exploration; medical complications such as heart attack, stroke, pleural effusion and pneumonia. The patient will receive DVT and antibiotic prophylaxis as indicated. She voiced a clear understanding. She had the opportunity to ask questions. Perioperative instructions were reviewed with her. Prescriptions for post-op  medications were sent to her pharmacy of choice.  24 minutes of total time was spent for this patient encounter, including preparation, face-to-face counseling with the patient and coordination of care, and documentation of the encounter.  Jeral Pinch, MD  Division of Gynecologic Oncology  Department of Obstetrics and Gynecology  [facility]

## 2022-07-06 NOTE — H&P (View-Only) (Signed)
Gynecologic Oncology Return Clinic Visit  07/06/22  Reason for Visit: Treatment planning  Treatment History: Oncology History Overview Note  Cancer Staging Malignant neoplasm of lower-inner quadrant of left breast in female, estrogen receptor positive ([place]) Staging form: Breast, AJCC 8th Edition - Clinical stage from 02/09/2021: Stage IA (cT1c, cN0, cM0, G2, ER+, PR+, HER2-) - Signed by Truitt Merle, MD on 02/20/2021 Stage prefix: Initial diagnosis Histologic grading system: 3 grade system - Pathologic stage from 03/02/2021: Stage IA (pT2, pN0, cM0, G2, ER+, PR+, HER2-, Oncotype DX score: 26) - Signed by Truitt Merle, MD on 03/21/2021 Stage prefix: Initial diagnosis Multigene prognostic tests performed: Oncotype DX Recurrence score range: Greater than or equal to 11 Histologic grading system: 3 grade system Residual tumor (R): R0 - None    Malignant neoplasm of lower-inner quadrant of left breast in female, estrogen receptor positive ([place])  02/07/2021 Mammogram   Exam: Left Diagnostic Mammogram; Left Breast Ultrasound  IMPRESSION: Irregular mass in left breast at 6:30, measuring 1.6 cm by mammogram, is highly suggestive of malignancy. Left axillary ultrasound is negative for lymphadenopathy.   02/09/2021 Cancer Staging   Staging form: Breast, AJCC 8th Edition - Clinical stage from 02/09/2021: Stage IA (cT1c, cN0, cM0, G2, ER+, PR+, HER2-) - Signed by Truitt Merle, MD on 02/20/2021 Stage prefix: Initial diagnosis Histologic grading system: 3 grade system   02/09/2021 Pathology Results   Diagnosis Breast, left, needle core biopsy, 6:30 o'clock 8cm fn - INVASIVE DUCTAL CARCINOMA WITH HISTIOCYTOID FEATURES. SEE NOTE Diagnosis Note Carcinoma measures 1 cm in greatest linear dimension and appears grade 2.  PROGNOSTIC INDICATORS Results: IMMUNOHISTOCHEMICAL AND MORPHOMETRIC ANALYSIS PERFORMED MANUALLY The tumor cells are EQUIVOCAL for Her2 (2+). Her2 by FISH will be performed and results performed  separately. Estrogen Receptor: >95%, POSITIVE, STRONG STAINING INTENSITY Progesterone Receptor: 90%, POSITIVE, STRONG STAINING INTENSITY Proliferation Marker Ki67: 20%  FLUORESCENCE IN-SITU HYBRIDIZATION Results: GROUP 5: HER2 **NEGATIVE** Equivocal form of amplification of the HER2 gene was detected in the IHC 2+ tissue sample received from this individual. HER2 FISH was performed by a technologist and cell imaging and analysis on the [employer]. RATIO OF HER2/CEN17 SIGNALS 1.30 AVERAGE HER2 COPY NUMBER PER CELL 1.75   02/15/2021 Initial Diagnosis   Malignant neoplasm of lower-inner quadrant of left breast in female, estrogen receptor positive ([place])   03/01/2021 Genetic Testing   Positive genetic testing: pathogenic variant detected in BRCA2 at [id number].  No other pathogenic variants detected in [facility] Custom[facility] Panel.  The report date is 03/19/2021.  The CustomNext-Cancer+RNAinsight panel offered by [facility] includes sequencing and rearrangement analysis for the following 47 genes:  APC, ATM, AXIN2, BARD1, BMPR1A, BRCA1, BRCA2, BRIP1, CDH1, CDK4, CDKN2A, CHEK2, DICER1, EPCAM, GREM1, HOXB13, MEN1, MLH1, MSH2, MSH3, MSH6, MUTYH, NBN, NF1, NF2, NTHL1, PALB2, PMS2, POLD1, POLE, PTEN, RAD51C, RAD51D, RECQL, RET, SDHA, SDHAF2, SDHB, SDHC, SDHD, SMAD4, SMARCA4, STK11, TP53, TSC1, TSC2, and VHL.  RNA data is routinely analyzed for use in variant interpretation for all genes.   03/02/2021 Cancer Staging   Staging form: Breast, AJCC 8th Edition - Pathologic stage from 03/02/2021: Stage IA (pT2, pN0, cM0, G2, ER+, PR+, HER2-, Oncotype DX score: 26) - Signed by Truitt Merle, MD on 03/21/2021 Stage prefix: Initial diagnosis Multigene prognostic tests performed: Oncotype DX Recurrence score range: Greater than or equal to 11 Histologic grading system: 3 grade system Residual tumor (R): R0 - None   05/02/2021 - 07/12/2021 Chemotherapy   Patient is on Treatment Plan : BREAST TC q21d  08/22/2021  Pathology Results   FINAL MICROSCOPIC DIAGNOSIS:   A. BREAST, LEFT, MASTECTOMY:  - Fibrocystic changes.  - Lumpectomy scar with hemosiderin deposition.  - No malignancy identified.   B. BREAST, RIGHT, MASTECTOMY:  - Fibrocystic changes with focal fibroadenomatoid change.  - No malignancy identified.     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 She has completed adjuvant chemotherapy.  She is scheduled to have bilateral mastectomies next week.  Genetic testing revealed that she has a BRCA2 mutation.  From an oncologic standpoint, plan will be for postoperative aromatase inhibitor given menopausal status and ER+ cancer.  Her most recent [facility] was consistent with menopause (76.9).   Patient reports overall doing well.  She notes appetite has been up and down.  She has gained some weight recently.  She has about a week of abdominal symptoms including intermittent bloating and feeling gassy as well as nausea.  She describes some baseline epigastric abdominal pain which has been worse for the last week, describes intermittent radiation to the right side of her upper abdomen.  Notes being constipated at baseline, no recent change.  Has intermittent bilateral pelvic pain.  She also endorses symptoms consistent with both stress and urge urinary incontinence.  Patient has been seen by urogynecology, most recently in 2020 for diagnosis of overactive bladder.  At that point, she was started on trospium.   Patient's GYN history is notable for hysterectomy in 2001 which it sounds like was performed for uterine fibroids.  Patient thinks that both fallopian tubes and ovaries were left in situ.  Patient remembers having menopausal symptoms around her mid 29s.  Patient thinks she may have had an abnormal Pap smear about 5 years ago with a repeat normal Pap smear.  The only Pap test that I see in our system is from 2014.  This Pap was negative, high risk HPV not  detected.  Interval History: Reports overall doing well.  Notes some intermittent baseline pelvic pain.  Denies vaginal bleeding or discharge.  Reports baseline constipation.  CBGs have been running 50s-90s at home.  Past Medical/Surgical History: Past Medical History:  Diagnosis Date   Allergy    pollen   Anemia    Anxiety    BRCA2 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 03/01/2021   Breast cancer [facility])    Cataract    surgical repair bilateral   Complication of anesthesia    not herself for 2 days after last surgery (brest lumpectomy)   Coronary artery disease    Depression    ESRD on hemodialysis ([place])    Home HD 5x per week- not on dialysis now had tramsplant 4/17   Family history of ovarian cancer 02/22/2021   GERD (gastroesophageal reflux disease)    Hearing loss 2017   right ear   History of blood transfusion    transfusion reaction   Hyperlipidemia    Hypertension    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 per pt-dr levy also II; Per patient 08/18/21 she is Type 2   Kidney transplant recipient    Neuromuscular disorder ([place])    NEUROPATHY   Sleep apnea     Past Surgical History:  Procedure Laterality Date   ABDOMINAL HYSTERECTOMY     BREAST EXCISIONAL BIOPSY Right 08/2015   BREAST LUMPECTOMY WITH RADIOACTIVE SEED LOCALIZATION Right 09/14/2015   Procedure: RIGHT BREAST LUMPECTOMY WITH RADIOACTIVE SEED LOCALIZATION;  Surgeon: Donnie Mesa, MD;  Location: [place];  Service: General;  Laterality: Right;   BREAST LUMPECTOMY WITH RADIOACTIVE SEED LOCALIZATION Left 03/02/2021   Procedure: LEFT BREAST SEED LUMPECTOMY LEFT SENTINEL LYMPH NODE [place];  Surgeon: Erroll Luna, MD;  Location: [place];  Service: General;  Laterality: Left;  GEN AND PEC BLOCK   BREAST SURGERY Bilateral    biopsy bilateral   CARDIAC CATHETERIZATION     CATARACT EXTRACTION Bilateral    bilateral    CHOLECYSTECTOMY     CYST REMOVAL NECK     DIALYSIS FISTULA CREATION Left    EYE SURGERY Bilateral    lazer   kidney transplant     LEFT HEART CATH AND CORONARY ANGIOGRAPHY N/A 03/04/2019   Procedure: LEFT HEART CATH AND CORONARY ANGIOGRAPHY;  Surgeon: Martinique, Peter M, MD;  Location: [place] CV LAB;  Service: Cardiovascular;  Laterality: N/A;   LEFT HEART CATHETERIZATION WITH CORONARY ANGIOGRAM N/A 03/30/2014   Procedure: LEFT HEART CATHETERIZATION WITH CORONARY ANGIOGRAM;  Surgeon: Sinclair Grooms, MD;  Location[facility] C[facility];  Service: Cardiovascular;  Laterality: N/A;   LIPOMA EXCISION N/A 02/06/2017   Procedure: EXCISION POSTERIOR NECK SEBACEOUS CYST;  Surgeon: Coralie Keens, MD;  Location: [place];  Service: General;  Laterality: N/A;   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   Procedure: REPAIR OF ARTERIOVENOUS FISTULA ANEURYSM;  Surgeon: Serafina Mitchell, MD;  Location: [facility] OR;  Service: Vascular;  Laterality: Left;   REVISON OF ARTERIOVENOUS FISTULA Left 09/28/2013   Procedure: EXCISE ESCHAR LEFT ARM  ARTERIOVENOUS FISTULA WITH PLICATION OF LEFT ARM ARTERIOVENOUS FISTULA;  Surgeon: Elam Dutch, MD;  Location: [facility] OR;  Service: Vascular;  Laterality: Left;   REVISON OF ARTERIOVENOUS FISTULA Left 08/26/2015   Procedure: RESECTION ANEURYSM OF LEFT ARM ARTERIOVENOUS FISTULA  ;  Surgeon: Serafina Mitchell, MD;  Location: [place];  Service: Vascular;  Laterality: Left;   SIMPLE MASTECTOMY WITH AXILLARY SENTINEL NODE BIOPSY Bilateral 08/22/2021   Procedure: BILATERAL SIMPLE MASTECTOMY;  Surgeon: Erroll Luna, MD;  Location: [place];  Service: General;  Laterality: Bilateral;   TUBAL LIGATION      Family History  Problem Relation Age of Onset   Diabetes Mother    Kidney disease Mother    Diabetes Father    Heart disease Father    Kidney disease Father    Hypertension Father    Diabetes Sister    Asthma Sister    Lupus Sister    Diabetes Brother    Diabetes  Brother    Diabetes Brother    Diabetes Brother    Cancer Maternal Grandmother 41       ovarian cancer   Ovarian cancer Maternal Grandmother    Diabetes Maternal Grandfather    Diabetes Paternal Grandmother    Colon cancer Neg Hx    Colon polyps Neg Hx    Esophageal cancer Neg Hx    Rectal cancer Neg Hx    Stomach cancer Neg Hx    Breast cancer Neg Hx    Endometrial cancer Neg Hx    Pancreatic cancer Neg Hx    Prostate cancer Neg Hx     Social History   Socioeconomic History   Marital status: Legally Separated    Spouse name: Not on file   Number of children: 3   Years of education: 9   Highest education level: Not on file  Occupational History   Occupation: disabled  Tobacco Use  Smoking status: Never   Smokeless tobacco: Never  Vaping Use   Vaping Use: Never used  Substance and Sexual Activity   Alcohol use: Not Currently    Comment: Socially   Drug use: No   Sexual activity: Not Currently    Birth control/protection: None, Surgical  Other Topics Concern   Not on file  Social History Narrative   Lives in home with daughters, grand daughter, son-in-law   Caffeine use - 1 cup coffee sometimes    Social Determinants of Health   Financial Resource Strain: High Risk (02/22/2021)   Overall Financial Resource Strain (CARDIA)    Difficulty of Paying Living Expenses: Hard  Food Insecurity: Food Insecurity Present (02/22/2021)   Hunger Vital Sign    Worried About Running Out of Food in the Last Year: Sometimes true    Ran Out of Food in the Last Year: Sometimes true  Transportation Needs: No Transportation Needs (02/22/2021)   PRAPARE - Hydrologist (Medical): No    Lack of Transportation (Non-Medical): No  Physical Activity: Not on file  Stress: Not on file  Social Connections: Not on file    Current Medications:  Current Outpatient Medications:    Accu-Chek Softclix Lancets lancets, Use as instructed (Patient taking differently: 1  each by Other route See admin instructions. Use as instructed), Disp: 100 each, Rfl: 12   acetaminophen (TYLENOL) 500 MG tablet, Take 500 mg by mouth every 6 (six) hours as needed for mild pain, fever or headache., Disp: , Rfl:    acidophilus (RISAQUAD) CAPS capsule, Take 1 capsule by mouth daily., Disp: , Rfl:    aspirin EC 81 MG EC tablet, Take 1 tablet (81 mg total) by mouth daily. (Patient taking differently: Take 81 mg by mouth at bedtime.), Disp: 90 tablet, Rfl: 3   Blood Glucose Monitoring Suppl (ACCU-CHEK AVIVA CONNECT) w/Device KIT, Check sugar 1x daily (Patient taking differently: 1 each by Other route See admin instructions. Check sugar 1x daily), Disp: 1 kit, Rfl: 0   carvedilol (COREG) 25 MG tablet, Take 25 mg by mouth 2 (two) times daily with a meal., Disp: , Rfl:    cetirizine (ZYRTEC) 10 MG tablet, Take 10 mg by mouth daily as needed for allergies., Disp: , Rfl:    cholecalciferol (VITAMIN D3) 25 MCG (1000 UNIT) tablet, Take 1,000 Units by mouth daily., Disp: , Rfl:    cyanocobalamin 1000 MCG tablet, Take 1,000 mcg by mouth daily., Disp: , Rfl:    DROPLET PEN NEEDLES 31G X 8 MM MISC, , Disp: , Rfl:    escitalopram (LEXAPRO) 10 MG tablet, Take 10 mg by mouth daily., Disp: , Rfl:    furosemide (LASIX) 40 MG tablet, Take 40 mg by mouth 2 (two) times daily as needed for fluid., Disp: , Rfl:    gabapentin (NEURONTIN) 300 MG capsule, Take 1 capsule (300 mg total) by mouth 2 (two) times daily. Start at 1 cap once daily, and increase to twice daily if she tolerates. Continue '600mg'$  at night (Patient taking differently: Take 300-600 mg by mouth See admin instructions. Take 300 mg by mouth in the morning and 600 mg at night), Disp: 60 capsule, Rfl: 0   Garlic 637 MG TABS, Take 100 mg by mouth daily., Disp: , Rfl:    glucose blood (ACCU-CHEK AVIVA PLUS) test strip, Check sugar 1x daily (Patient taking differently: 1 each by Other route See admin instructions. Check sugar 1x daily), Disp: 100 each,  Rfl:  12   hydrALAZINE (APRESOLINE) 25 MG tablet, Take 25 mg by mouth 2 (two) times daily., Disp: , Rfl:    insulin glargine (LANTUS SOLOSTAR) 100 UNIT/ML Solostar Pen, Inject 25-30 Units into the skin See admin instructions. Inject 30 units into the skin in the morning and 25 units at bedtime, Disp: , Rfl:    Insulin Pen Needle (PEN NEEDLES) 30G X 8 MM MISC, 1 each by Does not apply route daily. E11.9, Disp: 90 each, Rfl: 0   magnesium oxide (MAG-OX) 400 MG tablet, Take 400 mg by mouth 2 (two) times daily., Disp: , Rfl:    multivitamin-lutein (OCUVITE-LUTEIN) CAPS capsule, Take 1 capsule by mouth daily., Disp: , Rfl:    mycophenolate (MYFORTIC) 180 MG EC tablet, Take 360 mg by mouth 2 (two) times daily., Disp: , Rfl:    oxyCODONE (OXY IR/ROXICODONE) 5 MG immediate release tablet, Take 1 tablet (5 mg total) by mouth every 6 (six) hours as needed for severe pain. For AFTER surgery only, do not take and drive (Patient taking differently: Take 5 mg by mouth 2 (two) times daily as needed for severe pain.), Disp: 15 tablet, Rfl: 0   pantoprazole (PROTONIX) 40 MG tablet, Take 40 mg by mouth 2 (two) times daily., Disp: , Rfl: 5   predniSONE (DELTASONE) 5 MG tablet, Take 5 mg by mouth daily. , Disp: , Rfl: 2   rosuvastatin (CRESTOR) 5 MG tablet, Take 5 mg by mouth at bedtime., Disp: , Rfl:    tamoxifen (NOLVADEX) 20 MG tablet, Take 1 tablet (20 mg total) by mouth daily., Disp: 30 tablet, Rfl: 3   tirzepatide (MOUNJARO) 12.5 MG/0.5ML Pen, Inject 12.5 mg into the skin once a week., Disp: , Rfl:    senna-docusate (SENOKOT-S) 8.6-50 MG tablet, Take 2 tablets by mouth at bedtime. For AFTER surgery, do not take if having diarrhea, Disp: 30 tablet, Rfl: 0  Current Facility-Administered Medications:    dextrose 5 % solution, , Intravenous, Once, Mansouraty, Telford Nab., MD  Review of Systems: + Fatigue, hearing loss, palpitations, bloating, constipation, frequency, incontinence, hot flashes, pelvic pain, joint  pain, back pain, muscle pain, dizziness, numbness, depression. Denies appetite changes, fevers, chills, unexplained weight changes. Denies neck lumps or masses, mouth sores, ringing in ears or voice changes. Denies cough or wheezing.  Denies shortness of breath. Denies chest pain. Denies leg swelling. Denies abdominal pain, blood in stools, diarrhea, nausea, vomiting, or early satiety. Denies pain with intercourse, dysuria, hematuria. Denies vaginal bleeding or vaginal discharge.   Denies itching, rash, or wounds. Denies headaches or seizures. Denies swollen lymph nodes or glands, denies easy bruising or bleeding. Denies anxiety, confusion, or decreased concentration.  Physical Exam: BP (!) 140/67 (BP Location: Right Arm, Patient Position: Sitting)   Pulse 83   Temp 98 F (36.7 C) (Oral)   Resp 14   Wt 168 lb 1.6 oz (76.2 kg)   SpO2 98%   BMI 31.76 kg/m  General: Alert, oriented, no acute distress. HEENT: Normocephalic, atraumatic, sclera anicteric. Chest: Clear to auscultation bilaterally.  No wheezes or rhonchi. Cardiovascular: Regular rate and rhythm, no murmurs. Extremities: Grossly normal range of motion.  Warm, well perfused.  No edema bilaterally.  Laboratory & Radiologic Studies: Component Ref Range & Units 2 mo ago 10 mo ago  Cancer Antigen (CA) 125 0.0 - 38.1 U/mL 6.6 9.1 CM   Pelvic ultrasound 05/24/22:  There is a 1.7 cm simple cyst within the left ovary. Surgically absent uterus.  Assessment & Plan:  Cynthia [name] is a 52 y.o. woman with ER+ breast cancer and BRCA2 mutation.   Patient represents to discuss surgery again.  Diabetes is under much better control now. Hemoglobin A1c at the beginning of October was 7.9%. Most recent A1c was 6.4%.  Reviewed most recent pelvic ultrasound which showed a simple cyst on the left ovary and Ca1 25 which was normal.  We previously discussed risk reduction in the setting of BRCA mutation as well as therapeutic BSO in the  setting of her estrogen receptor positive breast cancer.  I reviewed again today the significant decrease in risk related to ovarian and fallopian tube cancer with a bilateral salpingo-oophorectomy.  We discussed that there still remains a small risk of primary peritoneal cancer.    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 We discussed the risk given location of the transplant kidney in her right pelvis. She has had a CT in the last few years with details the anatomy of her transplanted kidney and ureter. I would not proceed with removal of the right adnexa if I thought there was any significant risk of damaging her transplant.    The plan is for a robotic assisted bilateral salpingo-oophorectomy, possible staging, possible laparotomy. We discussed the small but present risk of incidental cancer at the time of risk-reducing surgery for BRCA mutations.  In the setting of concern for cancer at the time of surgery, I would plan to send biopsy to pathology for frozen section.  If malignancy encountered, we discussed additional staging procedures to include total hysterectomy, peritoneal biopsies, lymph node sampling, and omentectomy. The risks of surgery were discussed in detail and she understands these to include infection; wound separation; hernia; vaginal cuff separation, injury to adjacent organs such as bowel, bladder, blood vessels, ureters and nerves; bleeding which may require blood transfusion; anesthesia risk; thromboembolic events; possible death; unforeseen complications; possible need for re-exploration; medical complications such as heart attack, stroke, pleural effusion and pneumonia. The patient will receive DVT and antibiotic prophylaxis as indicated. She voiced a clear understanding. She had the opportunity to ask questions. Perioperative instructions were reviewed with her. Prescriptions for post-op  medications were sent to her pharmacy of choice.  24 minutes of total time was spent for this patient encounter, including preparation, face-to-face counseling with the patient and coordination of care, and documentation of the encounter.  Jeral Pinch, MD  Division of Gynecologic Oncology  Department of Obstetrics and Gynecology  [facility]

## 2022-07-09 ENCOUNTER — Encounter (HOSPITAL_COMMUNITY): Payer: Self-pay

## 2022-07-09 ENCOUNTER — Encounter (HOSPITAL_COMMUNITY)
Admission: RE | Admit: 2022-07-09 | Discharge: 2022-07-09 | Disposition: A | Discharge status: Home / Self Care | Payer: Medicare HMO | Source: Ambulatory Visit | Attending: Gynecologic Oncology | Admitting: Gynecologic Oncology

## 2022-07-09 VITALS — BP 152/66 | HR 77 | Temp 98.2°F | Resp 14 | Ht 61.0 in | Wt 158.0 lb

## 2022-07-09 DIAGNOSIS — Z1502 Genetic susceptibility to malignant neoplasm of ovary: Secondary | ICD-10-CM | POA: Insufficient documentation

## 2022-07-09 DIAGNOSIS — C50919 Malignant neoplasm of unspecified site of unspecified female breast: Secondary | ICD-10-CM | POA: Insufficient documentation

## 2022-07-09 DIAGNOSIS — Z01812 Encounter for preprocedural laboratory examination: Secondary | ICD-10-CM | POA: Insufficient documentation

## 2022-07-09 DIAGNOSIS — Z940 Kidney transplant status: Secondary | ICD-10-CM | POA: Diagnosis not present

## 2022-07-09 DIAGNOSIS — Z1501 Genetic susceptibility to malignant neoplasm of breast: Secondary | ICD-10-CM | POA: Diagnosis not present

## 2022-07-09 DIAGNOSIS — Z170 Estrogen receptor positive status [ER+]: Secondary | ICD-10-CM | POA: Insufficient documentation

## 2022-07-09 DIAGNOSIS — I120 Hypertensive chronic kidney disease with stage 5 chronic kidney disease or end stage renal disease: Secondary | ICD-10-CM | POA: Diagnosis not present

## 2022-07-09 DIAGNOSIS — E1022 Type 1 diabetes mellitus with diabetic chronic kidney disease: Secondary | ICD-10-CM | POA: Insufficient documentation

## 2022-07-09 DIAGNOSIS — Z992 Dependence on renal dialysis: Secondary | ICD-10-CM | POA: Diagnosis not present

## 2022-07-09 DIAGNOSIS — N186 End stage renal disease: Secondary | ICD-10-CM | POA: Diagnosis not present

## 2022-07-09 DIAGNOSIS — Z794 Long term (current) use of insulin: Secondary | ICD-10-CM | POA: Insufficient documentation

## 2022-07-09 DIAGNOSIS — I2510 Atherosclerotic heart disease of native coronary artery without angina pectoris: Secondary | ICD-10-CM | POA: Insufficient documentation

## 2022-07-09 LAB — HEMOGLOBIN A1C
Hgb A1c MFr Bld: 6.4 % — ABNORMAL HIGH (ref 4.8–5.6)
Mean Plasma Glucose: 136.98 mg/dL

## 2022-07-09 LAB — COMPREHENSIVE METABOLIC PANEL
ALT: 14 U/L (ref 0–44)
AST: 15 U/L (ref 15–41)
Albumin: 3.7 g/dL (ref 3.5–5.0)
Alkaline Phosphatase: 41 U/L (ref 38–126)
Anion gap: 7 (ref 5–15)
BUN: 33 mg/dL — ABNORMAL HIGH (ref 6–20)
CO2: 24 mmol/L (ref 22–32)
Calcium: 10.2 mg/dL (ref 8.9–10.3)
Chloride: 109 mmol/L (ref 98–111)
Creatinine, Ser: 1.72 mg/dL — ABNORMAL HIGH (ref 0.44–1.00)
GFR, Estimated: 35 mL/min — ABNORMAL LOW (ref >60)
Glucose, Bld: 75 mg/dL (ref 70–99)
Potassium: 4.6 mmol/L (ref 3.5–5.1)
Sodium: 140 mmol/L (ref 135–145)
Total Bilirubin: 0.4 mg/dL (ref 0.3–1.2)
Total Protein: 6.2 g/dL — ABNORMAL LOW (ref 6.5–8.1)

## 2022-07-09 LAB — CBC
HCT: 38.9 % (ref 36.0–46.0)
Hemoglobin: 12.2 g/dL (ref 12.0–15.0)
MCH: 29.5 pg (ref 26.0–34.0)
MCHC: 31.4 g/dL (ref 30.0–36.0)
MCV: 94 fL (ref 80.0–100.0)
Platelets: 162 10*3/uL (ref 150–400)
RBC: 4.14 MIL/uL (ref 3.87–5.11)
RDW: 12.8 % (ref 11.5–15.5)
WBC: 5.7 10*3/uL (ref 4.0–10.5)
nRBC: 0 % (ref 0.0–0.2)

## 2022-07-09 LAB — GLUCOSE, CAPILLARY: Glucose-Capillary: 77 mg/dL (ref 70–99)

## 2022-07-09 NOTE — Progress Notes (Signed)
Creatinine 1.72 results routed to Dr. Berline Lopes

## 2022-07-10 ENCOUNTER — Telehealth: Payer: Self-pay

## 2022-07-10 NOTE — Progress Notes (Signed)
Anesthesia Chart Review:   Case: [id number] Date/Time: 07/11/22 1148   Procedures:      XI ROBOTIC ASSISTED BILATERAL SALPINGO OOPHORECTOMY     POSSIBLE LAPAROTOMY WITH POSSIBLE STAGING   Anesthesia type: General   Pre-op diagnosis: ER POSITIVE BREAST CANCER, BRCA II MUTATION   Location: [facility] ROOM 05 / [facility]   Surgeons: Lafonda Mosses, MD       DISCUSSION: Pt is 52 years old with hx nonobstructive CAD, DM type 1, HTN, ESRD s/p kidney transplant 2017, OSA, breast cancer (s/p B mastectomy 2023).   Pt hospitalized 10/5-10/8/23 for AKI at [facility] (notes in care everywhere). Previous baseline Cr after kidney transplant was ~1.2 in 2022 but slowly trending upward. At time of hospitalization, creatinine was 2.8 and improved to 1.8 with fluids. Cause of AKI suspected to be prerenal due to acute viral illness (positive for rhinovirus)   VS: BP (!) 152/66   Pulse 77   Temp 36.8 C (Oral)   Resp 14   Ht '5\' 1"'$  (1.549 m)   Wt 71.7 kg   SpO2 100%   BMI 29.85 kg/m    PROVIDERS: - PCP is Arthur Holms, NP - Nephrologist is Pearson Grippe, MD - Cardiologist is Buford Dresser, MD - Kidney transplant team at [facility] follows patient.    LABS: Labs reviewed: Acceptable for surgery. - Cr 1.72, BUN 33. This is consistent with prior results from 04/01/22.   (all labs ordered are listed, but  only abnormal results are displayed)  Labs Reviewed  COMPREHENSIVE METABOLIC PANEL - Abnormal; Notable for the following components:      Result Value   BUN 33 (*)    Creatinine, Ser 1.72 (*)    Total Protein 6.2 (*)    GFR, Estimated 35 (*)    All other components within normal limits  HEMOGLOBIN A1C - Abnormal; Notable for the following components:   Hgb A1c MFr Bld 6.4 (*)    All other components within normal limits  CBC  GLUCOSE, CAPILLARY  TYPE AND SCREEN     IMAGES: CXR 03/29/22 (care everywhere):  - No acute radiographic cardiopulmonary abnormality.   EKG 08/08/21: NSR at 76 bpm     CV: Echo 03/13/22 (care everywhere): - Normal LV systolic function with moderate LVH - Normal RV systolic function - Trivial MR, trivial PR, trivial TR - No valvular stenosis   Nuclear stress test 11/24/20 (lexiscan) [facility] - found in CV procedures tab):  -normal ECG at rest and stress -both rest and stress images normal -LVEF 63% with normal wall motion -no evidence of ischemia   Cardiac cath 03/04/2019 Prox LAD to Mid LAD lesion is 10% stenosed. Prox Cx to Mid Cx lesion is 20% stenosed. 1st Mrg lesion is 20% stenosed. Prox RCA to Mid RCA lesion is 10% stenosed. The left ventricular systolic function is normal. LV end diastolic pressure is normal. The left ventricular ejection fraction is 55-65% by visual estimate.   Past Medical History:  Diagnosis Date   Allergy    pollen   Anemia    Anxiety    BRCA2 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 03/01/2021   Breast cancer [facility])    Cataract    surgical repair bilateral   Complication of anesthesia    not herself for 2 days after last surgery (brest lumpectomy)   Coronary artery disease    Depression    ESRD on hemodialysis ([place])    Home HD 5x per week- not on dialysis  now had tramsplant 4/17   Family history of ovarian cancer 02/22/2021   GERD (gastroesophageal reflux disease)    Hearing loss 2017   right ear   History of blood transfusion    transfusion reaction   Hyperlipidemia    Hypertension    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 per pt-dr levy also II; Per patient 08/18/21 she is Type 2   Kidney transplant recipient    Neuromuscular disorder ([place])    NEUROPATHY   Sleep apnea     Past Surgical History:  Procedure Laterality Date   ABDOMINAL HYSTERECTOMY     BREAST EXCISIONAL BIOPSY Right 08/2015   BREAST LUMPECTOMY WITH RADIOACTIVE SEED LOCALIZATION Right 09/14/2015   Procedure: RIGHT BREAST LUMPECTOMY WITH RADIOACTIVE SEED LOCALIZATION;   Surgeon: Donnie Mesa, MD;  Location: [place];  Service: General;  Laterality: Right;   BREAST LUMPECTOMY WITH RADIOACTIVE SEED LOCALIZATION Left 03/02/2021   Procedure: LEFT BREAST SEED LUMPECTOMY LEFT SENTINEL LYMPH NODE [place];  Surgeon: Erroll Luna, MD;  Location: [place];  Service: General;  Laterality: Left;  GEN AND PEC BLOCK   BREAST SURGERY Bilateral    biopsy bilateral   CARDIAC CATHETERIZATION     CATARACT EXTRACTION Bilateral    bilateral   CHOLECYSTECTOMY     CYST REMOVAL NECK     DIALYSIS FISTULA CREATION Left    EYE SURGERY Bilateral    lazer   kidney transplant     LEFT HEART CATH AND CORONARY ANGIOGRAPHY N/A 03/04/2019   Procedure: LEFT HEART CATH AND CORONARY ANGIOGRAPHY;  Surgeon: Martinique, Peter M, MD;  Location: [place] CV LAB;  Service: Cardiovascular;  Laterality: N/A;   LEFT HEART CATHETERIZATION WITH CORONARY ANGIOGRAM N/A 03/30/2014   Procedure: LEFT HEART CATHETERIZATION WITH CORONARY ANGIOGRAM;  Surgeon: Sinclair Grooms, MD;  Location: [facility] CATH LAB;  Service: Cardiovascular;  Laterality: N/A;   LIPOMA EXCISION N/A 02/06/2017   Procedure: EXCISION POSTERIOR NECK SEBACEOUS CYST;  Surgeon: Coralie Keens, MD;  Location: [place];  Service: General;  Laterality: N/A;   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   Procedure: REPAIR OF ARTERIOVENOUS FISTULA ANEURYSM;  Surgeon: Serafina Mitchell, MD;  [facility];  Service: Vascular;  Laterality: Left;   REVISON OF ARTERIOVENOUS FISTULA Left 09/28/2013   Procedure: EXCISE ESCHAR LEFT ARM  ARTERIOVENOUS FISTULA WITH PLICATION OF LEFT ARM ARTERIOVENOUS FISTULA;  Surgeon: Elam Dutch, MD;  Location: [place];  Service: Vascular;  Laterality: Left;   REVISON OF ARTERIOVENOUS FISTULA Left 08/26/2015   Procedure: RESECTION ANEURYSM OF LEFT ARM ARTERIOVENOUS FISTULA  ;  Surgeon: Serafina Mitchell, MD;  Location: [place];  Service: Vascular;  Laterality: Left;    SIMPLE MASTECTOMY WITH AXILLARY SENTINEL NODE BIOPSY Bilateral 08/22/2021   Procedure: BILATERAL SIMPLE MASTECTOMY;  Surgeon: Erroll Luna, MD;  Location: [place];  Service: General;  Laterality: Bilateral;   TUBAL LIGATION      MEDICATIONS:  Accu-Chek Softclix Lancets lancets   acetaminophen (TYLENOL) 500 MG tablet   acidophilus (RISAQUAD) CAPS capsule   aspirin EC 81 MG EC tablet   Blood Glucose Monitoring Suppl (ACCU-CHEK AVIVA CONNECT) w/Device KIT   carvedilol (COREG) 25 MG tablet   cetirizine (ZYRTEC) 10 MG tablet   cholecalciferol (VITAMIN D3) 25 MCG (1000 UNIT) tablet   cyanocobalamin 1000 MCG tablet   DROPLET PEN NEEDLES 31G X 8 MM MISC   escitalopram (LEXAPRO) 10 MG tablet  furosemide (LASIX) 40 MG tablet   gabapentin (NEURONTIN) 300 MG capsule   Garlic 427 MG TABS   glucose blood (ACCU-CHEK AVIVA PLUS) test strip   hydrALAZINE (APRESOLINE) 25 MG tablet   insulin glargine (LANTUS SOLOSTAR) 100 UNIT/ML Solostar Pen   Insulin Pen Needle (PEN NEEDLES) 30G X 8 MM MISC   magnesium oxide (MAG-OX) 400 MG tablet   multivitamin-lutein (OCUVITE-LUTEIN) CAPS capsule   mycophenolate (MYFORTIC) 180 MG EC tablet   oxyCODONE (OXY IR/ROXICODONE) 5 MG immediate release tablet   pantoprazole (PROTONIX) 40 MG tablet   predniSONE (DELTASONE) 5 MG tablet   rosuvastatin (CRESTOR) 5 MG tablet   senna-docusate (SENOKOT-S) 8.6-50 MG tablet   tamoxifen (NOLVADEX) 20 MG tablet   tirzepatide (MOUNJARO) 12.5 MG/0.5ML Pen    dextrose 5 % solution    If no changes, I anticipate pt can proceed with surgery as scheduled.   Willeen Cass, PhD, FNP-BC [facility]/Anesthesiology Phone: [phone] 07/10/2022 10:44 AM

## 2022-07-10 NOTE — Telephone Encounter (Signed)
Attempted to reach patient to check in with her pre-operatively. Unable to reach patient. Left message requesting return call.  Via [employer] DV#[id number].   I spoke with Ms.Kreager' husband, Al. Reviewed pre-op instructions with him. He voiced an understanding.

## 2022-07-10 NOTE — Anesthesia Preprocedure Evaluation (Addendum)
Anesthesia Evaluation  Patient identified by MRN, date of birth, ID band Patient awake    Reviewed: Allergy & Precautions, H&P , NPO status , Patient's Chart, lab work & pertinent test results  Airway Mallampati: II  TM Distance: >3 FB Neck ROM: Full    Dental no notable dental hx.    Pulmonary sleep apnea    Pulmonary exam normal breath sounds clear to auscultation       Cardiovascular hypertension, Pt. on medications + CAD  Normal cardiovascular exam Rhythm:Regular Rate:Normal     Neuro/Psych   Anxiety Depression    negative neurological ROS  negative psych ROS   GI/Hepatic Neg liver ROS,GERD  ,,  Endo/Other  negative endocrine ROSdiabetes, Type 1    Renal/GU Renal InsufficiencyRenal disease  negative genitourinary   Musculoskeletal negative musculoskeletal ROS (+)    Abdominal   Peds negative pediatric ROS (+)  Hematology  (+) Blood dyscrasia, anemia   Anesthesia Other Findings   Reproductive/Obstetrics negative OB ROS                             Anesthesia Physical Anesthesia Plan  ASA: 3  Anesthesia Plan: General   Post-op Pain Management: Tylenol PO (pre-op)* and Dilaudid IV   Induction: Intravenous  PONV Risk Score and Plan: 3 and Ondansetron, Dexamethasone, Midazolam and Treatment may vary due to age or medical condition  Airway Management Planned: Oral ETT  Additional Equipment:   Intra-op Plan:   Post-operative Plan: Extubation in OR  Informed Consent: I have reviewed the patients History and Physical, chart, labs and discussed the procedure including the risks, benefits and alternatives for the proposed anesthesia with the patient or authorized representative who has indicated his/her understanding and acceptance.     Dental advisory given  Plan Discussed with: CRNA  Anesthesia Plan Comments: (See APP note by Durel Salts, FNP )       Anesthesia Quick  Evaluation

## 2022-07-11 ENCOUNTER — Telehealth: Payer: Self-pay | Admitting: *Deleted

## 2022-07-11 ENCOUNTER — Other Ambulatory Visit: Payer: Self-pay

## 2022-07-11 ENCOUNTER — Ambulatory Visit (HOSPITAL_BASED_OUTPATIENT_CLINIC_OR_DEPARTMENT_OTHER): Payer: Medicare HMO | Admitting: Certified Registered"

## 2022-07-11 ENCOUNTER — Encounter (HOSPITAL_COMMUNITY)
Admission: RE | Disposition: A | Discharge status: Home / Self Care | Payer: Self-pay | Source: Home / Self Care | Attending: Gynecologic Oncology

## 2022-07-11 ENCOUNTER — Encounter (HOSPITAL_COMMUNITY): Payer: Self-pay | Admitting: Gynecologic Oncology

## 2022-07-11 ENCOUNTER — Ambulatory Visit (HOSPITAL_COMMUNITY)
Admission: RE | Admit: 2022-07-11 | Discharge: 2022-07-11 | Disposition: A | Discharge status: Home / Self Care | Payer: Medicare HMO | Attending: Gynecologic Oncology | Admitting: Gynecologic Oncology

## 2022-07-11 ENCOUNTER — Ambulatory Visit (HOSPITAL_COMMUNITY): Payer: Medicare HMO | Admitting: Physician Assistant

## 2022-07-11 DIAGNOSIS — E119 Type 2 diabetes mellitus without complications: Secondary | ICD-10-CM | POA: Diagnosis not present

## 2022-07-11 DIAGNOSIS — I2510 Atherosclerotic heart disease of native coronary artery without angina pectoris: Secondary | ICD-10-CM | POA: Diagnosis not present

## 2022-07-11 DIAGNOSIS — E1022 Type 1 diabetes mellitus with diabetic chronic kidney disease: Secondary | ICD-10-CM

## 2022-07-11 DIAGNOSIS — K219 Gastro-esophageal reflux disease without esophagitis: Secondary | ICD-10-CM | POA: Insufficient documentation

## 2022-07-11 DIAGNOSIS — C50312 Malignant neoplasm of lower-inner quadrant of left female breast: Secondary | ICD-10-CM

## 2022-07-11 DIAGNOSIS — Z1502 Genetic susceptibility to malignant neoplasm of ovary: Secondary | ICD-10-CM | POA: Diagnosis not present

## 2022-07-11 DIAGNOSIS — Z1501 Genetic susceptibility to malignant neoplasm of breast: Secondary | ICD-10-CM | POA: Diagnosis not present

## 2022-07-11 DIAGNOSIS — F419 Anxiety disorder, unspecified: Secondary | ICD-10-CM | POA: Diagnosis not present

## 2022-07-11 DIAGNOSIS — Z1509 Genetic susceptibility to other malignant neoplasm: Secondary | ICD-10-CM

## 2022-07-11 DIAGNOSIS — I10 Essential (primary) hypertension: Secondary | ICD-10-CM

## 2022-07-11 DIAGNOSIS — Z794 Long term (current) use of insulin: Secondary | ICD-10-CM | POA: Diagnosis not present

## 2022-07-11 DIAGNOSIS — Z4002 Encounter for prophylactic removal of ovary: Secondary | ICD-10-CM

## 2022-07-11 DIAGNOSIS — Z7985 Long-term (current) use of injectable non-insulin antidiabetic drugs: Secondary | ICD-10-CM | POA: Diagnosis not present

## 2022-07-11 DIAGNOSIS — Z148 Genetic carrier of other disease: Secondary | ICD-10-CM

## 2022-07-11 DIAGNOSIS — F32A Depression, unspecified: Secondary | ICD-10-CM | POA: Diagnosis not present

## 2022-07-11 DIAGNOSIS — Z90710 Acquired absence of both cervix and uterus: Secondary | ICD-10-CM | POA: Insufficient documentation

## 2022-07-11 DIAGNOSIS — G4730 Sleep apnea, unspecified: Secondary | ICD-10-CM | POA: Insufficient documentation

## 2022-07-11 DIAGNOSIS — C50919 Malignant neoplasm of unspecified site of unspecified female breast: Secondary | ICD-10-CM

## 2022-07-11 DIAGNOSIS — Z940 Kidney transplant status: Secondary | ICD-10-CM | POA: Diagnosis not present

## 2022-07-11 DIAGNOSIS — Z853 Personal history of malignant neoplasm of breast: Secondary | ICD-10-CM | POA: Insufficient documentation

## 2022-07-11 DIAGNOSIS — Z9221 Personal history of antineoplastic chemotherapy: Secondary | ICD-10-CM | POA: Diagnosis not present

## 2022-07-11 DIAGNOSIS — F418 Other specified anxiety disorders: Secondary | ICD-10-CM

## 2022-07-11 DIAGNOSIS — K5900 Constipation, unspecified: Secondary | ICD-10-CM | POA: Diagnosis not present

## 2022-07-11 HISTORY — PX: ROBOTIC ASSISTED BILATERAL SALPINGO OOPHERECTOMY: SHX6078

## 2022-07-11 LAB — GLUCOSE, CAPILLARY
Glucose-Capillary: 135 mg/dL — ABNORMAL HIGH (ref 70–99)
Glucose-Capillary: 69 mg/dL — ABNORMAL LOW (ref 70–99)
Glucose-Capillary: 129 mg/dL — ABNORMAL HIGH (ref 70–99)

## 2022-07-11 SURGERY — SALPINGO-OOPHORECTOMY, BILATERAL, ROBOT-ASSISTED
Anesthesia: General

## 2022-07-11 MED ORDER — MIDAZOLAM HCL 2 MG/2ML IJ SOLN
INTRAMUSCULAR | Status: AC
  Filled 2022-07-11: qty 2

## 2022-07-11 MED ORDER — DEXAMETHASONE SODIUM PHOSPHATE 4 MG/ML IJ SOLN: 4.0000 mg | INTRAMUSCULAR | Status: DC

## 2022-07-11 MED ORDER — HYDROMORPHONE HCL 1 MG/ML IJ SOLN
0.2500 mg | INTRAMUSCULAR | Status: DC | PRN
  Administered 2022-07-11 (×2): 0.5 mg via INTRAVENOUS

## 2022-07-11 MED ORDER — MIDAZOLAM HCL 2 MG/2ML IJ SOLN
INTRAMUSCULAR | Status: DC | PRN
  Administered 2022-07-11: 2 mg via INTRAVENOUS

## 2022-07-11 MED ORDER — CHLORHEXIDINE GLUCONATE 0.12 % MT SOLN
15.0000 mL | Freq: Once | OROMUCOSAL | Status: AC
  Administered 2022-07-11: 15 mL via OROMUCOSAL

## 2022-07-11 MED ORDER — ACETAMINOPHEN 500 MG PO TABS
1000.0000 mg | ORAL_TABLET | ORAL | Status: AC
  Administered 2022-07-11: 1000 mg via ORAL
  Filled 2022-07-11: qty 2

## 2022-07-11 MED ORDER — PHENYLEPHRINE 80 MCG/ML (10ML) SYRINGE FOR IV PUSH (FOR BLOOD PRESSURE SUPPORT)
PREFILLED_SYRINGE | INTRAVENOUS | Status: AC
  Filled 2022-07-11: qty 10

## 2022-07-11 MED ORDER — HYDROMORPHONE HCL 1 MG/ML IJ SOLN
INTRAMUSCULAR | Status: AC
  Filled 2022-07-11: qty 1

## 2022-07-11 MED ORDER — HEPARIN SODIUM (PORCINE) 5000 UNIT/ML IJ SOLN
5000.0000 [IU] | INTRAMUSCULAR | Status: AC
  Administered 2022-07-11: 5000 [IU] via SUBCUTANEOUS
  Filled 2022-07-11: qty 1

## 2022-07-11 MED ORDER — ONDANSETRON HCL 4 MG/2ML IJ SOLN
INTRAMUSCULAR | Status: AC
  Filled 2022-07-11: qty 2

## 2022-07-11 MED ORDER — OXYCODONE HCL 5 MG/5ML PO SOLN: 5.0000 mg | Freq: Once | ORAL | Status: DC | PRN

## 2022-07-11 MED ORDER — BUPIVACAINE HCL 0.25 % IJ SOLN
INTRAMUSCULAR | Status: AC
  Filled 2022-07-11: qty 1

## 2022-07-11 MED ORDER — ROCURONIUM BROMIDE 10 MG/ML (PF) SYRINGE
PREFILLED_SYRINGE | INTRAVENOUS | Status: DC | PRN
  Administered 2022-07-11: 20 mg via INTRAVENOUS
  Administered 2022-07-11: 70 mg via INTRAVENOUS

## 2022-07-11 MED ORDER — ONDANSETRON HCL 4 MG/2ML IJ SOLN
INTRAMUSCULAR | Status: DC | PRN
  Administered 2022-07-11: 4 mg via INTRAVENOUS

## 2022-07-11 MED ORDER — PROPOFOL 10 MG/ML IV BOLUS
INTRAVENOUS | Status: AC
  Filled 2022-07-11: qty 20

## 2022-07-11 MED ORDER — LACTATED RINGERS IV SOLN: INTRAVENOUS | Status: DC

## 2022-07-11 MED ORDER — SUGAMMADEX SODIUM 200 MG/2ML IV SOLN
INTRAVENOUS | Status: DC | PRN
  Administered 2022-07-11: 200 mg via INTRAVENOUS

## 2022-07-11 MED ORDER — AMISULPRIDE (ANTIEMETIC) 5 MG/2ML IV SOLN: 10.0000 mg | Freq: Once | INTRAVENOUS | Status: DC | PRN

## 2022-07-11 MED ORDER — EPHEDRINE SULFATE-NACL 50-0.9 MG/10ML-% IV SOSY
PREFILLED_SYRINGE | INTRAVENOUS | Status: DC | PRN
  Administered 2022-07-11 (×3): 5 mg via INTRAVENOUS

## 2022-07-11 MED ORDER — BUPIVACAINE HCL 0.25 % IJ SOLN
INTRAMUSCULAR | Status: DC | PRN
  Administered 2022-07-11: 30 mL

## 2022-07-11 MED ORDER — PHENYLEPHRINE 80 MCG/ML (10ML) SYRINGE FOR IV PUSH (FOR BLOOD PRESSURE SUPPORT)
PREFILLED_SYRINGE | INTRAVENOUS | Status: DC | PRN
  Administered 2022-07-11: 80 ug via INTRAVENOUS

## 2022-07-11 MED ORDER — PHENYLEPHRINE HCL-NACL 20-0.9 MG/250ML-% IV SOLN
INTRAVENOUS | Status: DC | PRN
  Administered 2022-07-11: 20 ug/min via INTRAVENOUS

## 2022-07-11 MED ORDER — LIDOCAINE 2% (20 MG/ML) 5 ML SYRINGE
INTRAMUSCULAR | Status: DC | PRN
  Administered 2022-07-11: 40 mg via INTRAVENOUS

## 2022-07-11 MED ORDER — ORAL CARE MOUTH RINSE: 15.0000 mL | Freq: Once | OROMUCOSAL | Status: AC

## 2022-07-11 MED ORDER — PROMETHAZINE HCL 25 MG/ML IJ SOLN: 6.2500 mg | INTRAMUSCULAR | Status: DC | PRN

## 2022-07-11 MED ORDER — DEXAMETHASONE SODIUM PHOSPHATE 10 MG/ML IJ SOLN
INTRAMUSCULAR | Status: DC | PRN
  Administered 2022-07-11: 4 mg via INTRAVENOUS

## 2022-07-11 MED ORDER — ROCURONIUM BROMIDE 10 MG/ML (PF) SYRINGE
PREFILLED_SYRINGE | INTRAVENOUS | Status: AC
  Filled 2022-07-11: qty 10

## 2022-07-11 MED ORDER — OXYCODONE HCL 5 MG PO TABS: 5.0000 mg | ORAL_TABLET | Freq: Once | ORAL | Status: DC | PRN

## 2022-07-11 MED ORDER — STERILE WATER FOR IRRIGATION IR SOLN
Status: DC | PRN
  Administered 2022-07-11: 1000 mL

## 2022-07-11 MED ORDER — PROPOFOL 10 MG/ML IV BOLUS
INTRAVENOUS | Status: DC | PRN
  Administered 2022-07-11: 150 mg via INTRAVENOUS

## 2022-07-11 MED ORDER — EPHEDRINE 5 MG/ML INJ
INTRAVENOUS | Status: AC
  Filled 2022-07-11: qty 5

## 2022-07-11 MED ORDER — DEXAMETHASONE SODIUM PHOSPHATE 10 MG/ML IJ SOLN
INTRAMUSCULAR | Status: AC
  Filled 2022-07-11: qty 1

## 2022-07-11 MED ORDER — SCOPOLAMINE 1 MG/3DAYS TD PT72
1.0000 | MEDICATED_PATCH | TRANSDERMAL | Status: DC
  Administered 2022-07-11: 1.5 mg via TRANSDERMAL
  Filled 2022-07-11: qty 1

## 2022-07-11 MED ORDER — DEXTROSE 50 % IV SOLN
12.5000 g | INTRAVENOUS | Status: AC
  Administered 2022-07-11: 12.5 g via INTRAVENOUS
  Filled 2022-07-11: qty 50

## 2022-07-11 MED ORDER — LACTATED RINGERS IR SOLN
Status: DC | PRN
  Administered 2022-07-11: 1000 mL

## 2022-07-11 MED ORDER — FENTANYL CITRATE (PF) 100 MCG/2ML IJ SOLN
INTRAMUSCULAR | Status: AC
  Filled 2022-07-11: qty 2

## 2022-07-11 MED ORDER — LIDOCAINE HCL (PF) 2 % IJ SOLN
INTRAMUSCULAR | Status: AC
  Filled 2022-07-11: qty 5

## 2022-07-11 MED ORDER — FENTANYL CITRATE (PF) 100 MCG/2ML IJ SOLN
INTRAMUSCULAR | Status: DC | PRN
  Administered 2022-07-11: 100 ug via INTRAVENOUS

## 2022-07-11 SURGICAL SUPPLY — 71 items
AGENT HMST KT MTR STRL THRMB (HEMOSTASIS)
APL ESCP 34 STRL LF DISP (HEMOSTASIS)
APPLICATOR SURGIFLO ENDO (HEMOSTASIS) IMPLANT
BAG LAPAROSCOPIC 12 15 PORT 16 (BASKET) IMPLANT
BAG RETRIEVAL 12/15 (BASKET)
BLADE SURG SZ10 CARB STEEL (BLADE) IMPLANT
COVER BACK TABLE 60X90IN (DRAPES) ×1 IMPLANT
COVER TIP SHEARS 8 DVNC (MISCELLANEOUS) ×1 IMPLANT
COVER TIP SHEARS 8MM DA VINCI (MISCELLANEOUS) ×1
DERMABOND ADVANCED .7 DNX12 (GAUZE/BANDAGES/DRESSINGS) ×1 IMPLANT
DRAPE ARM DVNC X/XI (DISPOSABLE) ×4 IMPLANT
DRAPE COLUMN DVNC XI (DISPOSABLE) ×1 IMPLANT
DRAPE DA VINCI XI ARM (DISPOSABLE) ×4
DRAPE DA VINCI XI COLUMN (DISPOSABLE) ×1
DRAPE SHEET LG 3/4 BI-LAMINATE (DRAPES) ×1 IMPLANT
DRAPE SURG IRRIG POUCH 19X23 (DRAPES) ×1 IMPLANT
ELECT PENCIL ROCKER SW 15FT (MISCELLANEOUS) IMPLANT
ELECT REM PT RETURN 15FT ADLT (MISCELLANEOUS) ×1 IMPLANT
GAUZE 4X4 16PLY ~~LOC~~+RFID DBL (SPONGE) ×3 IMPLANT
GLOVE BIO SURGEON STRL SZ 6 (GLOVE) ×4 IMPLANT
GLOVE BIO SURGEON STRL SZ 6.5 (GLOVE) ×2 IMPLANT
GRASPER SUT TROCAR 14GX15 (MISCELLANEOUS) IMPLANT
HOLDER FOLEY CATH W/STRAP (MISCELLANEOUS) ×1 IMPLANT
IRRIG SUCT STRYKERFLOW 2 WTIP (MISCELLANEOUS) ×1
IRRIGATION SUCT STRKRFLW 2 WTP (MISCELLANEOUS) ×1 IMPLANT
KIT PROCEDURE DA VINCI SI (MISCELLANEOUS)
KIT PROCEDURE DVNC SI (MISCELLANEOUS) IMPLANT
KIT TURNOVER KIT A (KITS) ×1 IMPLANT
LEGGING LITHOTOMY PAIR STRL (DRAPES) ×1 IMPLANT
MANIPULATOR UTERINE 4.5 ZUMI (MISCELLANEOUS) ×1 IMPLANT
NDL SAFETY ECLIP 18X1.5 (MISCELLANEOUS) IMPLANT
NDL SPNL 18GX3.5 QUINCKE PK (NEEDLE) IMPLANT
NEEDLE HYPO 22GX1.5 SAFETY (NEEDLE) ×1 IMPLANT
NEEDLE SPNL 18GX3.5 QUINCKE PK (NEEDLE) IMPLANT
OBTURATOR OPTICAL STANDARD 8MM (TROCAR) ×1
OBTURATOR OPTICAL STND 8 DVNC (TROCAR) ×1
OBTURATOR OPTICALSTD 8 DVNC (TROCAR) ×1 IMPLANT
PACK ROBOT GYN CUSTOM WL (TRAY / TRAY PROCEDURE) ×1 IMPLANT
PACK ROBOTIC GOWN (GOWN DISPOSABLE) ×1 IMPLANT
PAD POSITIONING PINK XL (MISCELLANEOUS) ×1 IMPLANT
PENCIL SMOKE EVACUATOR (MISCELLANEOUS) IMPLANT
PORT ACCESS TROCAR AIRSEAL 12 (TROCAR) ×1 IMPLANT
SEAL CANN UNIV 5-8 DVNC XI (MISCELLANEOUS) ×3 IMPLANT
SEAL XI 5MM-8MM UNIVERSAL (MISCELLANEOUS) ×4
SET TRI-LUMEN FLTR TB AIRSEAL (TUBING) ×1 IMPLANT
SPIKE FLUID TRANSFER (MISCELLANEOUS) IMPLANT
SURGIFLO W/THROMBIN 8M KIT (HEMOSTASIS) IMPLANT
SUT MNCRL AB 4-0 PS2 18 (SUTURE) IMPLANT
SUT PDS AB 0 CTX 60 (SUTURE) IMPLANT
SUT VIC AB 0 CT1 27 (SUTURE)
SUT VIC AB 0 CT1 27XBRD ANTBC (SUTURE) IMPLANT
SUT VIC AB 2-0 CT2 27 (SUTURE) IMPLANT
SUT VIC AB 3-0 SH 27 (SUTURE)
SUT VIC AB 3-0 SH 27X BRD (SUTURE) IMPLANT
SUT VIC AB 4-0 PS2 18 (SUTURE) ×2 IMPLANT
SYR 10ML LL (SYRINGE) IMPLANT
SYR 20ML LL LF (SYRINGE) ×1 IMPLANT
SYR CONTROL 10ML LL (SYRINGE) IMPLANT
SYS BAG RETRIEVAL 10MM (BASKET)
SYS RETRIEVAL 5MM INZII UNIV (BASKET) ×1
SYS WOUND ALEXIS 18CM MED (MISCELLANEOUS)
SYSTEM BAG RETRIEVAL 10MM (BASKET) IMPLANT
SYSTEM RETRIEVL 5MM INZII UNIV (BASKET) IMPLANT
SYSTEM WOUND ALEXIS 18CM MED (MISCELLANEOUS) ×1 IMPLANT
TRAP SPECIMEN MUCUS 40CC (MISCELLANEOUS) IMPLANT
TRAY FOLEY MTR SLVR 14FR STAT (SET/KITS/TRAYS/PACK) ×1 IMPLANT
TRAY FOLEY MTR SLVR 16FR STAT (SET/KITS/TRAYS/PACK) IMPLANT
TUBING CONNECTING 10 (TUBING) IMPLANT
UNDERPAD 30X36 HEAVY ABSORB (UNDERPADS AND DIAPERS) ×1 IMPLANT
WATER STERILE IRR 1000ML POUR (IV SOLUTION) ×1 IMPLANT
YANKAUER SUCT BULB TIP NO VENT (SUCTIONS) IMPLANT

## 2022-07-11 NOTE — Anesthesia Procedure Notes (Signed)
Procedure Name: Intubation Date/Time: 07/11/2022 12:28 PM  Performed by: Eben Burow, CRNAPre-anesthesia Checklist: Patient identified, Emergency Drugs available, Suction available, Patient being monitored and Timeout performed Patient Re-evaluated:Patient Re-evaluated prior to induction Oxygen Delivery Method: Circle system utilized Preoxygenation: Pre-oxygenation with 100% oxygen Induction Type: IV induction Ventilation: Mask ventilation without difficulty Laryngoscope Size: Mac and 4 Grade View: Grade I Tube type: Oral Tube size: 7.0 mm Number of attempts: 1 Airway Equipment and Method: Stylet Placement Confirmation: ETT inserted through vocal cords under direct vision, positive ETCO2 and breath sounds checked- equal and bilateral Secured at: 21 cm Tube secured with: Tape Dental Injury: Teeth and Oropharynx as per pre-operative assessment

## 2022-07-11 NOTE — Anesthesia Postprocedure Evaluation (Signed)
Anesthesia Post Note  Patient: Cynthia [name]  Procedure(s) Performed: XI ROBOTIC Kevin OOPHORECTOMY     Patient location during evaluation: PACU Anesthesia Type: General Level of consciousness: awake and alert Pain management: pain level controlled Vital Signs Assessment: post-procedure vital signs reviewed and stable Respiratory status: spontaneous breathing, nonlabored ventilation and respiratory function stable Cardiovascular status: blood pressure returned to baseline and stable Postop Assessment: no apparent nausea or vomiting Anesthetic complications: no   No notable events documented.  Last Vitals:  Vitals:   07/11/22 1530 07/11/22 1545  BP: (!) 150/61 135/63  Pulse: 71 71  Resp: 12 15  Temp:    SpO2: 98% 95%    Last Pain:  Vitals:   07/11/22 1530  TempSrc:   PainSc: [place]

## 2022-07-11 NOTE — Op Note (Signed)
OPERATIVE NOTE  Pre-operative Diagnosis: BRCA2 mutation  Post-operative Diagnosis: same  Operation: Robotic-assisted laparoscopic bilateral salpingo-oophorectomy   Surgeon: Jeral Pinch MD  Assistant Surgeon: Joylene John NP  Anesthesia: GET  Urine Output: 250 cc  Operative Findings: On EUA, no adnexal masses. Cuff without lesions or nodularity. On intra-abdominal entry, normal upper abdominal survey. Some adhesions of the omentum to the anterior abdominal wall just inferior to the umbilicus (mostly filmy). Uterus and cervix surgically absent. No ascites. Bilateral ovaries normal in appearance, no definitive tubal structure on the right. Right ovary somewhat adherent to the vaginal cuff. Pelvic kidney along right extraperitoneal pelvic kidney. Ureter noted along right pelvic sidewall with great care taken with dissection along the right pelvic sidewall.   Estimated Blood Loss:  25 cc      Total IV Fluids: see I&O flowsheet         Specimens: bilateral tubes and ovaries, pelvic washings         Complications:  None apparent; patient tolerated the procedure well.         Disposition: PACU - hemodynamically stable.  Procedure Details  The patient was seen in the Holding Room. The risks, benefits, complications, treatment options, and expected outcomes were discussed with the patient.  The patient concurred with the proposed plan, giving informed consent.  The site of surgery properly noted/marked. The patient was identified as Cynthia [name] and the procedure verified as a Robotic-assisted bilateral salpingo-oophorectomy with any other indicated procedures.   After induction of anesthesia, the patient was draped and prepped in the usual sterile manner. Patient was placed in supine position after anesthesia and draped and prepped in the usual sterile manner as follows: Her arms were tucked to her side with all appropriate precautions.  The shoulders were stabilized with padded  shoulder blocks applied to the acromium processes.  The patient was placed in the semi-lithotomy position in [place].  The perineum and vagina were prepped with Betadine. The patient's abdomen was prepped with ChloraPrep and then she was draped after the prep had been allowed to dry for 3 minutes.  A Time Out was held and the above information confirmed.  The urethra was prepped with Betadine. Foley catheter was placed. OG tube placement was confirmed and to suction.   Next, a 5 mm skin incision was made 1 cm below the subcostal margin in the midclavicular line.  The 5 mm Optiview port and scope was used for direct entry.  Opening pressure was under 10 mm CO2.  The abdomen was insufflated and the findings were noted as above.   At this point and all points during the procedure, the patient's intra-abdominal pressure did not exceed 15 mmHg. Next, an 8 mm skin incision was made superior to the umbilicus and a right and left port were placed about 8 cm lateral to the robot port on the right and left side.  A fourth arm was placed on the left.  The 5 mm assist trocar was exchanged for a 5 mm airseal port. All ports were placed under direct visualization.  The patient was placed in steep Trendelenburg.  Bowel was folded away into the upper abdomen.  The robot was docked in the normal manner.  Pelvic washings were obtained.  The right and left peritoneum were opened parallel to the IP ligament to open the retroperitoneal spaces bilaterally. The remnant left round ligament was transected. . The ureter was noted to be on the medial leaf of the broad ligament.  The peritoneum above the ureter was incised and stretched and the infundibulopelvic ligament was skeletonized, cauterized and cut. This was performed to assure at least a two centimeter margin along the IP ligament from the proximal aspect of the ovarian tissue.  On the right, meticulous dissection was used to assure ureter along the broad ligament was  visualized and intact. Bilateral ovaries were then carefully dissected free from their distal peritoneal attachments. Once free, both adnexa were placed in the pelvis.  Irrigation was used and excellent hemostasis was achieved.  At this point in the procedure was completed. One robotic instrument was removed and a 5 mm Endocatch bag was inserted and the adnexa placed within. Robotic instruments were removed under direct visulaization.  The robot was undocked. Bilateral ovaries were then removed and sent to pathology. The abdomen was desufflated using the airseal.  The subcuticular tissue was closed with 4-0 Vicryl and the skin was closed with 4-0 Monocryl in a subcuticular manner.  Dermabond was applied.    Foley catheter was removed.  All sponge, lap and needle counts were correct x  3.   The patient was transferred to the recovery room in stable condition.  Jeral Pinch, MD

## 2022-07-11 NOTE — Interval H&P Note (Signed)
History and Physical Interval Note:  07/11/2022 11:34 AM  Cynthia [name]  has presented today for surgery, with the diagnosis of ER POSITIVE BREAST CANCER, BRCA II MUTATION.  The various methods of treatment have been discussed with the patient and family. After consideration of risks, benefits and other options for treatment, the patient has consented to  Procedure(s): XI ROBOTIC ASSISTED BILATERAL SALPINGO OOPHORECTOMY (N/A) POSSIBLE LAPAROTOMY WITH POSSIBLE STAGING (N/A) as a surgical intervention.  The patient's history has been reviewed, patient examined, no change in status, stable for surgery.  I have reviewed the patient's chart and labs.  Questions were answered to the patient's satisfaction.     Cynthia [name]

## 2022-07-11 NOTE — Progress Notes (Signed)
Per patient she does not have thoughts/plan of self harm

## 2022-07-11 NOTE — Transfer of Care (Signed)
Immediate Anesthesia Transfer of Care Note  Patient: [name]  Procedure(s) Performed: XI ROBOTIC ASSISTED BILATERAL SALPINGO OOPHORECTOMY  Patient Location: PACU  Anesthesia Type:General  Level of Consciousness: awake, drowsy, and patient cooperative  Airway & Oxygen Therapy: Patient Spontanous Breathing and Patient connected to face mask oxygen  Post-op Assessment: Report given to RN and Post -op Vital signs reviewed and stable  Post vital signs: Reviewed and stable  Last Vitals:  Vitals Value Taken Time  BP 164/67   Temp    Pulse 66   Resp 14   SpO2 100     Last Pain:  Vitals:   07/11/22 1100  TempSrc:   PainSc: 0-No pain         Complications: No notable events documented.

## 2022-07-11 NOTE — Progress Notes (Signed)
11:08AM IV D50 given 37m per protocol. Will recheck CBG 15 mins post as protocol calls for.  At 11:23 CBG was 135 with recheck.

## 2022-07-11 NOTE — Progress Notes (Signed)
Patient with CBG of 69 upon admission to [facility]. Pt reports taking 10u of insulin prior to arrival. Anesthesia - Dr. Cletus Gash notified. Orders to initiate Adult Hypoglycemia algorithm protocol and give IV D50. Patient notified and aware of plan.

## 2022-07-11 NOTE — Telephone Encounter (Signed)
Fax FMLA for the ptient's spouse

## 2022-07-11 NOTE — Discharge Instructions (Addendum)
AFTER SURGERY INSTRUCTIONS   Return to work: 4-6 weeks if applicable  We will call you tomorrow to check in. You can restart your tamoxifen the day after surgery. Do not restart taking baby aspirin for 5 days after surgery.   Activity: 1. Be up and out of the bed during the day.  Take a nap if needed.  You may walk up steps but be careful and use the hand rail.  Stair climbing will tire you more than you think, you may need to stop part way and rest.    2. No lifting or straining for 6 weeks over 10 pounds. No pushing, pulling, straining for 6 weeks.   3. No driving for around 1 week(s).  Do not drive if you are taking narcotic pain medicine and make sure that your reaction time has returned.    4. You can shower as soon as the next day after surgery. Shower daily.  Use your regular soap and water (not directly on the incision) and pat your incision(s) dry afterwards; don't rub.  No tub baths or submerging your body in water until cleared by your surgeon. If you have the soap that was given to you by pre-surgical testing that was used before surgery, you do not need to use it afterwards because this can irritate your incisions.    5. No sexual activity and nothing in the vagina for 4 weeks.   6. You may experience a small amount of clear drainage from your incisions, which is normal.  If the drainage persists, increases, or changes color please call the office.   7. Do not use creams, lotions, or ointments such as neosporin on your incisions after surgery until advised by your surgeon because they can cause removal of the dermabond glue on your incisions.     8. Take Tylenol first for pain and only use narcotic pain medication for severe pain not relieved by the Tylenol.  Monitor your Tylenol intake to a max of 4,000 mg in a 24 hour period.    Diet: 1. Low sodium Heart Healthy Diet is recommended but you are cleared to resume your normal (before surgery) diet after your procedure.   2. It  is safe to use a laxative, such as Miralax or Colace, if you have difficulty moving your bowels. You have been prescribed Sennakot-S to take at bedtime every evening after surgery to keep bowel movements regular and to prevent constipation.     Wound Care: 1. Keep clean and dry.  Shower daily.   Reasons to call the Doctor: Fever - Oral temperature greater than 100.4 degrees Fahrenheit Foul-smelling vaginal discharge Difficulty urinating Nausea and vomiting Increased pain at the site of the incision that is unrelieved with pain medicine. Difficulty breathing with or without chest pain New calf pain especially if only on one side Sudden, continuing increased vaginal bleeding with or without clots.   Contacts: For questions or concerns you should contact:   Dr. Jeral Pinch at [phone]   Joylene John, NP at [phone]   After Hours: call [phone] and have the GYN Oncologist paged/contacted (after 5 pm or on the weekends).   Messages sent via mychart are for non-urgent matters and are not responded to after hours so for urgent needs, please call the after hours number.

## 2022-07-12 ENCOUNTER — Telehealth: Payer: Self-pay | Admitting: *Deleted

## 2022-07-12 ENCOUNTER — Encounter (HOSPITAL_COMMUNITY): Payer: Self-pay | Admitting: Gynecologic Oncology

## 2022-07-12 NOTE — Telephone Encounter (Signed)
Call from patient's  husband "Al."  Patient is with Al and audible on line. She understands and answers some questions.  Requesting over the counter medication for gas pain. States has not taken Sennokot but has it and advised to start by taking two now. Reviewed ambulation, fluids, especially warm, heating pad on low but not directly on incision. States her  pain 6/10 but related to gas only. States no pain prior to gas pain.   Al states she is eating, drinking and urinating well. Incisions are dry and intact.   Instructed to call office with any fever, chills, purulent drainage, uncontrolled pain or any other questions or concerns. Patient verbalizes understanding.   Pt aware of post op appointments as well as the office number [phone] and after hours number [phone] to call if she has any questions or concerns

## 2022-07-12 NOTE — Telephone Encounter (Signed)
Call back to patient husband Al. States patient is feeling much better, has passed flatus and pain scale of 4. Tolerated soup for lunch and has taken two Sennokot. Aware can take this twice daily if needed.

## 2022-07-13 LAB — TYPE AND SCREEN
ABO/RH(D): O POS
Antibody Screen: NEGATIVE
Donor AG Type: NEGATIVE
Unit division: 0

## 2022-07-13 LAB — BPAM RBC
Blood Product Expiration Date: 202401292359
Unit Type and Rh: 9500

## 2022-07-14 NOTE — Progress Notes (Signed)
Patient here for follow up with Dr. Berline Lopes and for a pre-operative appointment prior to her scheduled surgery on July 11, 2022. She is scheduled for robotic BSO, poss staging. The surgery was discussed in detail.  See after visit summary for additional details. Visual aids used to discuss items related to surgery.      Discussed post-op pain management in detail including the aspects of the enhanced recovery pathway. She has a prescription for oxycodone at home and will use this if needed. We discussed the use of tylenol post-op and to monitor for a maximum of 4,000 mg in a 24 hour period.  Also prescribed sennakot to be used after surgery and to hold if having loose stools.  Discussed bowel regimen in detail.     Discussed the use of SCDs and measures to take at home to prevent DVT including frequent mobility.  Reportable signs and symptoms of DVT discussed. Post-operative instructions discussed and expectations for after surgery. Incisional care discussed as well including reportable signs and symptoms including erythema, drainage, wound separation.     5 minutes spent with the patient.  Verbalizing understanding of material discussed. No needs or concerns voiced at the end of the visit.   Advised patient to call for any needs.  Advised that her post-operative medications had been prescribed and could be picked up at any time.    This appointment is included in the global surgical bundle as pre-operative teaching and has no charge.

## 2022-07-17 LAB — SURGICAL PATHOLOGY

## 2022-07-19 LAB — CYTOLOGY - NON PAP

## 2022-07-27 ENCOUNTER — Ambulatory Visit (INDEPENDENT_AMBULATORY_CARE_PROVIDER_SITE_OTHER): Payer: Medicare HMO | Admitting: Gastroenterology

## 2022-07-27 ENCOUNTER — Encounter: Payer: Self-pay | Admitting: Gastroenterology

## 2022-07-27 VITALS — BP 136/62 | HR 75 | Ht 61.0 in | Wt 169.5 lb

## 2022-07-27 DIAGNOSIS — R140 Abdominal distension (gaseous): Secondary | ICD-10-CM | POA: Diagnosis not present

## 2022-07-27 DIAGNOSIS — K219 Gastro-esophageal reflux disease without esophagitis: Secondary | ICD-10-CM | POA: Diagnosis not present

## 2022-07-27 DIAGNOSIS — R093 Abnormal sputum: Secondary | ICD-10-CM

## 2022-07-27 DIAGNOSIS — R059 Cough, unspecified: Secondary | ICD-10-CM

## 2022-07-27 DIAGNOSIS — K5909 Other constipation: Secondary | ICD-10-CM

## 2022-07-27 MED ORDER — PANTOPRAZOLE SODIUM 40 MG PO TBEC: 40.0000 mg | DELAYED_RELEASE_TABLET | Freq: Two times a day (BID) | ORAL | 11 refills | Status: AC

## 2022-07-27 NOTE — Patient Instructions (Addendum)
Hemos enviado los siguientes medicamentos a su farmacia para que los recoja cuando le convenga: pantoprazol  Le han entregado un kit de prueba para Hydrographic surveyor el crecimiento excesivo de bacterias en el intestino delgado (SIBO), que lo completa una empresa llamada [employer]. Asegrese de Building surveyor la prueba por correo utilizando la etiqueta de envo de devolucin que se le entreg junto con Recruitment consultant. Su informacin demogrfica y de seguro ya se envi a la compaa y Armed forces logistics/support/administrative officer en contacto con usted durante las prximas 1 a 2 semanas con respecto a Theme park manager. [employer] cobrar un cargo inicial de [phone] para planes de seguro comerciales y [phone] si paga en efectivo. Asegrese de hablar con [employer] ANTES de realizarse la prueba para ver si han recibido informacin de su compaa de seguros sobre cunto costar de su bolsillo la prueba, si corresponde. Tenga en cuenta que recibir [employer] del nmero de telfono [phone] o un nmero similar. Si no recibe noticias suyas dentro de [employer], llame a Zigmund Daniel al [phone] o llame a [employer] directamente al [phone].  Pruebe Gas-X extra fuerte tres veces al da durante 2 semanas para ver si le resulta til.  Hemos enviado una derivacin a [facility]. Alguien de su oficina se comunicar con usted para concertar una cita.  Su proveedor le ha solicitado que vaya al stano para Optometrist anlisis de laboratorio antes de irse hoy. Presione "B" en el ascensor. El laboratorio est ubicado en la primera puerta a la izquierda al salir del Materials engineer.  _______________________________________________________  Lavada Mesi presin arterial en su visita era 140/90 o ms, comunquese con su mdico de atencin primaria para Patent attorney.  _______________________________________________________  Si tiene 65 aos o ms, su ndice de masa corporal debe estar entre 23 y 62. Su ndice de masa corporal es de 32,03 kg/m.  Si esto est fuera del rango mencionado anteriormente, considere realizar un seguimiento con su proveedor de Midwife.  Si tiene 62 aos o menos, su ndice de [employer] corporal debe estar entre 63 y 38. Su ndice de masa corporal es de 32,03 kg/m. Si esto est fuera del rango mencionado anteriormente, considere realizar un seguimiento con su proveedor de Midwife.  ________________________________________________________  [employer] de [facility] desean alentarlo a Risk manager MYCHART para comunicarse con los proveedores para solicitudes o preguntas que no sean urgentes. Debido a los Astronomer de espera en el telfono, enviar un mensaje a su proveedor a travs de [employer] puede ser Ardelia Mems manera ms rpida y eficiente de obtener una respuesta. Espere 48 horas hbiles para recibir [employer]. Recuerde que esto es para solicitudes no urgentes. _______________________________________________________  Conan Bowens elegirme a m y a [facility].  Dr. Rush Landmark   _______________________________________________________________________    We have sent the following medications to your pharmacy for you to pick up at your convenience: Pantoprazole   You have been given a testing kit to check for small intestine bacterial overgrowth (SIBO) which is completed by a company named [employer]. Make sure to return your test in the mail using the return mailing label given to you along with the kit. Your demographic and insurance information have already been sent to the company and they should be in contact with you over the next 1-2 weeks regarding this test. [employer] will collect an upfront charge of $99.74 for commercial insurance plans and $209.74 is you are paying cash. Make sure to discuss with [employer] PRIOR to having the test to see if they have gotten  information from your insurance company as to how much your testing will cost out of pocket, if any.  Please keep in mind that you will be getting a call from phone number [phone] or a similar number. If you do not hear from them within this time frame, please call our office at [phone] or call [employer] directly at [phone].   Try Gas- X extra strength three times daily for 2 weeks to see if this is helpful.   We have sent referral to [facility]. Someone from their office will contact you with an appointment.   Your provider has requested that you go to the basement level for lab work before leaving today. Press "B" on the elevator. The lab is located at the first door on the left as you exit the elevator.   _______________________________________________________  If your blood pressure at your visit was 140/90 or greater, please contact your primary care physician to follow up on this.  _______________________________________________________  If you are age 44 or older, your body mass index should be between 23-30. Your Body mass index is 32.03 kg/m. If this is out of the aforementioned range listed, please consider follow up with your Primary Care Provider.  If you are age 35 or younger, your body mass index should be between 19-25. Your Body mass index is 32.03 kg/m. If this is out of the aformentioned range listed, please consider follow up with your Primary Care Provider.   ________________________________________________________  The [facility] providers would like to encourage you to use [facility] to communicate with providers for non-urgent requests or questions.  Due to long hold times on the telephone, sending your provider a message by [facility] may be a faster and more efficient way to get a response.  Please allow 48 business hours for a response.  Please remember that this is for non-urgent requests.  _______________________________________________________  Thank you for choosing me and [facility].  Dr. Rush Landmark

## 2022-07-27 NOTE — Progress Notes (Signed)
GASTROENTEROLOGY OUTPATIENT CLINIC VISIT   Primary Care Provider Loura Back, NP [address] [place] [address] [phone]   Patient Profile: Cynthia [name] is a 52 y.o. female with a pmh significant for breast cancer (status post lumpectomy and hysterectomy and BSO), renal transplant, OSA, hypertension, diabetes, hyperlipidemia, GERD, Diverticulosis, colon polyps (TA's).  The patient presents to the [facility] for an evaluation and management of problem(s) noted below:  Problem List 1. Gastroesophageal reflux disease without esophagitis   2. Bloating   3. Abdominal distention   4. Chronic constipation   5. Increased sputum production     History of Present Illness Please see prior notes for full details of HPI.  Interval History Patient is seen in follow-up.  Today's visit is done with interpreter services available.  Within the last few weeks she actually underwent a BSO for her history of previous BRCA breast cancer.  She has been healing well.  Patient last seen at the time of her procedure visit in December.  She was dilated empirically with biopsies not showing evidence of eosinophilic esophagitis and no H. pylori was found.  Patient states that from a swallowing standpoint, she is feeling improvement after dilation.  Over the last few weeks, she has had increasing phlegm and sputum production.  She also has a taste of what she thinks is blood in the back of her throat.  She has not actually seen any blood itself.  And she denies hemoptysis or hematemesis or coffee-ground emesis.  She has previously seen a provider at [facility] but has not seen them in years.  Otherwise her bowels are moving nicely.  She had a period of time where she was not taking fiber because she had a cleanout before her surgery, but otherwise is using the restroom on a regular basis.  She has not noted any blood in her stools.  She unfortunately is still experiencing issues of  bloating and abdominal distention and discomfort that comes and goes.  GI Review of Systems Positive as above Negative for dysphagia, odynophagia, nausea, vomiting, pyrosis,, melena, hematochezia  Review of Systems General: Denies fevers/chills/weight loss unintentionally Cardiovascular: Denies chest pain Pulmonary: Denies shortness of breath Gastroenterological: See HPI Genitourinary: Denies darkened urine Hematological: Denies easy bruising/bleeding Dermatological: Denies jaundice Psychological: Mood is stable   Medications Current Outpatient Medications  Medication Sig Dispense Refill   Accu-Chek Softclix Lancets lancets Use as instructed (Patient taking differently: 1 each by Other route See admin instructions. Use as instructed) 100 each 12   acetaminophen (TYLENOL) 500 MG tablet Take 500 mg by mouth every 6 (six) hours as needed for mild pain, fever or headache.     acidophilus (RISAQUAD) CAPS capsule Take 1 capsule by mouth daily.     Blood Glucose Monitoring Suppl (ACCU-CHEK AVIVA CONNECT) w/Device KIT Check sugar 1x daily (Patient taking differently: 1 each by Other route See admin instructions. Check sugar 1x daily) 1 kit 0   carvedilol (COREG) 25 MG tablet Take 25 mg by mouth 2 (two) times daily with a meal.     cetirizine (ZYRTEC) 10 MG tablet Take 10 mg by mouth daily as needed for allergies.     cholecalciferol (VITAMIN D3) 25 MCG (1000 UNIT) tablet Take 1,000 Units by mouth daily.     cyanocobalamin 1000 MCG tablet Take 1,000 mcg by mouth daily.     DROPLET PEN NEEDLES 31G X 8 MM MISC      escitalopram (LEXAPRO) 10 MG tablet Take 10  mg by mouth daily.     furosemide (LASIX) 40 MG tablet Take 40 mg by mouth 2 (two) times daily as needed for fluid.     gabapentin (NEURONTIN) 300 MG capsule Take 1 capsule (300 mg total) by mouth 2 (two) times daily. Start at 1 cap once daily, and increase to twice daily if she tolerates. Continue 600mg  at night (Patient taking differently:  Take 300-600 mg by mouth See admin instructions. Take 300 mg by mouth in the morning and 600 mg at night) 60 capsule 0   Garlic 100 MG TABS Take 100 mg by mouth daily.     glucose blood (ACCU-CHEK AVIVA PLUS) test strip Check sugar 1x daily (Patient taking differently: 1 each by Other route See admin instructions. Check sugar 1x daily) 100 each 12   hydrALAZINE (APRESOLINE) 25 MG tablet Take 25 mg by mouth 2 (two) times daily.     insulin glargine (LANTUS SOLOSTAR) 100 UNIT/ML Solostar Pen Inject 25-30 Units into the skin See admin instructions. Inject 30 units into the skin in the morning and 25 units at bedtime     Insulin Pen Needle (PEN NEEDLES) 30G X 8 MM MISC 1 each by Does not apply route daily. E11.9 90 each 0   magnesium oxide (MAG-OX) 400 MG tablet Take 400 mg by mouth 2 (two) times daily.     multivitamin-lutein (OCUVITE-LUTEIN) CAPS capsule Take 1 capsule by mouth daily.     mycophenolate (MYFORTIC) 180 MG EC tablet Take 360 mg by mouth 2 (two) times daily.     oxyCODONE (OXY IR/ROXICODONE) 5 MG immediate release tablet Take 1 tablet (5 mg total) by mouth every 6 (six) hours as needed for severe pain. For AFTER surgery only, do not take and drive (Patient taking differently: Take 5 mg by mouth 2 (two) times daily as needed for severe pain.) 15 tablet 0   predniSONE (DELTASONE) 5 MG tablet Take 5 mg by mouth daily.   2   rosuvastatin (CRESTOR) 5 MG tablet Take 5 mg by mouth at bedtime.     senna-docusate (SENOKOT-S) 8.6-50 MG tablet Take 2 tablets by mouth at bedtime. For AFTER surgery, do not take if having diarrhea 30 tablet 0   tirzepatide (MOUNJARO) 12.5 MG/0.5ML Pen Inject 12.5 mg into the skin once a week.     pantoprazole (PROTONIX) 40 MG tablet Take 1 tablet (40 mg total) by mouth 2 (two) times daily. 60 tablet 11   No current facility-administered medications for this visit.    Allergies Allergies  Allergen Reactions   Docetaxel Nausea Only    Diaphoretic.  Patient had  hypersensitivity reaction to docetaxel.  See progress note from 05/24/2021.  Patient able to complete infusion.   Norvasc [Amlodipine] Swelling   Tape Other (See Comments)    Burns skin.  Please use paper tape only    Histories Past Medical History:  Diagnosis Date   Allergy    pollen   Anemia    Anxiety    BRCA2 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 03/01/2021   Breast cancer [facility])    Cataract    surgical repair bilateral   Complication of anesthesia    not herself for 2 days after last surgery (brest lumpectomy)   Coronary artery disease    Depression    ESRD on hemodialysis (HCC)    Home HD 5x per week- not on dialysis now had tramsplant 4/17   Family history of ovarian cancer 02/22/2021   GERD (gastroesophageal reflux  disease)    Hearing loss 2017   right ear   History of blood transfusion    transfusion reaction   Hyperlipidemia    Hypertension    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 per pt-dr levy also II; Per patient 08/18/21 she is Type 2   Kidney transplant recipient    Neuromuscular disorder (HCC)    NEUROPATHY   Sleep apnea    Past Surgical History:  Procedure Laterality Date   ABDOMINAL HYSTERECTOMY     BREAST EXCISIONAL BIOPSY Right 08/2015   BREAST LUMPECTOMY WITH RADIOACTIVE SEED LOCALIZATION Right 09/14/2015   Procedure: RIGHT BREAST LUMPECTOMY WITH RADIOACTIVE SEED LOCALIZATION;  Surgeon: Manus Rudd, MD;  Location: [facility];  Service: General;  Laterality: Right;   BREAST LUMPECTOMY WITH RADIOACTIVE SEED LOCALIZATION Left 03/02/2021   Procedure: LEFT BREAST SEED LUMPECTOMY LEFT SENTINEL LYMPH NODE MAPPING;  Surgeon: Harriette Bouillon, MD;  Location: [facility];  Service: General;  Laterality: Left;  GEN AND PEC BLOCK   BREAST SURGERY Bilateral    biopsy bilateral   CARDIAC CATHETERIZATION     CATARACT EXTRACTION Bilateral    bilateral   CHOLECYSTECTOMY     CYST  REMOVAL NECK     DIALYSIS FISTULA CREATION Left    EYE SURGERY Bilateral    lazer   kidney transplant     LEFT HEART CATH AND CORONARY ANGIOGRAPHY N/A 03/04/2019   Procedure: LEFT HEART CATH AND CORONARY ANGIOGRAPHY;  Surgeon: Swaziland, Peter M, MD;  Location: [facility];  Service: Cardiovascular;  Laterality: N/A;   LEFT HEART CATHETERIZATION WITH CORONARY ANGIOGRAM N/A 03/30/2014   Procedure: LEFT HEART CATHETERIZATION WITH CORONARY ANGIOGRAM;  Surgeon: Lesleigh Noe, MD;  Location: [facility];  Service: Cardiovascular;  Laterality: N/A;   LIPOMA EXCISION N/A 02/06/2017   Procedure: EXCISION POSTERIOR NECK SEBACEOUS CYST;  Surgeon: Abigail Miyamoto, MD;  Location: [facility];  Service: General;  Laterality: N/A;   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   Procedure: REPAIR OF ARTERIOVENOUS FISTULA ANEURYSM;  Surgeon: Nada Libman, MD;  Location: [facility];  Service: Vascular;  Laterality: Left;   REVISON OF ARTERIOVENOUS FISTULA Left 09/28/2013   Procedure: EXCISE ESCHAR LEFT ARM  ARTERIOVENOUS FISTULA WITH PLICATION OF LEFT ARM ARTERIOVENOUS FISTULA;  Surgeon: Sherren Kerns, MD;  Location: [facility];  Service: Vascular;  Laterality: Left;   REVISON OF ARTERIOVENOUS FISTULA Left 08/26/2015   Procedure: RESECTION ANEURYSM OF LEFT ARM ARTERIOVENOUS FISTULA  ;  Surgeon: Nada Libman, MD;  Location: [facility];  Service: Vascular;  Laterality: Left;   ROBOTIC ASSISTED BILATERAL SALPINGO OOPHERECTOMY N/A 07/11/2022   Procedure: XI ROBOTIC ASSISTED BILATERAL SALPINGO OOPHORECTOMY;  Surgeon: Carver Fila, MD;  Location: [facility];  Service: Gynecology;  Laterality: N/A;   SIMPLE MASTECTOMY WITH AXILLARY SENTINEL NODE BIOPSY Bilateral 08/22/2021   Procedure: BILATERAL SIMPLE MASTECTOMY;  Surgeon: Harriette Bouillon, MD;  Location: [facility];  Service: General;  Laterality: Bilateral;   TUBAL LIGATION     Social History   Socioeconomic History   Marital status: Legally Separated     Spouse name: Not on file   Number of children: 3   Years of education: 9   Highest education level: Not on file  Occupational History   Occupation: disabled  Tobacco Use   Smoking status: Never   Smokeless tobacco: Never  Vaping Use   Vaping Use: Never used  Substance and Sexual Activity   Alcohol use: Not Currently    Comment: Socially   Drug use: No   Sexual activity: Not Currently    Birth control/protection: None, Surgical  Other Topics Concern   Not on file  Social History Narrative   Lives in home with daughters, grand daughter, son-in-law   Caffeine use - 1 cup coffee sometimes    Social Determinants of Health   Financial Resource Strain: High Risk (02/22/2021)   Overall Financial Resource Strain (CARDIA)    Difficulty of Paying Living Expenses: Hard  Food Insecurity: Food Insecurity Present (02/22/2021)   Hunger Vital Sign    Worried About Running Out of Food in the Last Year: Sometimes true    Ran Out of Food in the Last Year: Sometimes true  Transportation Needs: No Transportation Needs (02/22/2021)   PRAPARE - Administrator, Civil Service (Medical): No    Lack of Transportation (Non-Medical): No  Physical Activity: Not on file  Stress: Not on file  Social Connections: Not on file  Intimate Partner Violence: Not on file   Family History  Problem Relation Age of Onset   Diabetes Mother    Kidney disease Mother    Diabetes Father    Heart disease Father    Kidney disease Father    Hypertension Father    Diabetes Sister    Asthma Sister    Lupus Sister    Diabetes Brother    Diabetes Brother    Diabetes Brother    Diabetes Brother    Cancer Maternal Grandmother 50       ovarian cancer   Ovarian cancer Maternal Grandmother    Diabetes Maternal Grandfather    Diabetes Paternal Grandmother    Colon cancer Neg Hx    Colon polyps Neg Hx    Esophageal cancer Neg Hx    Rectal cancer Neg Hx    Stomach cancer Neg Hx    Breast cancer Neg Hx     Endometrial cancer Neg Hx    Pancreatic cancer Neg Hx    Prostate cancer Neg Hx    Inflammatory bowel disease Neg Hx    Liver disease Neg Hx    I have reviewed her medical, social, and family history in detail and updated the electronic medical record as necessary.    PHYSICAL EXAMINATION  BP 136/62   Pulse 75   Ht 5\' 1"  (1.549 m)   Wt 169 lb 8 oz (76.9 kg)   SpO2 99%   BMI 32.03 kg/m  Wt Readings from Last 3 Encounters:  07/27/22 169 lb 8 oz (76.9 kg)  07/11/22 156 lb 8.4 oz (71 kg)  07/09/22 158 lb (71.7 kg)  GEN: NAD, appears stated age, doesn't appear chronically ill PSYCH: Cooperative, without pressured speech EYE: Conjunctivae pink, sclerae anicteric ENT: MMM CV: Nontachycardic RESP: No audible wheezing GI: NABS, soft, protuberant abdomen, rounded, nontender MSK/EXT: No significant lower extremity edema SKIN: No jaundice NEURO:  Alert & Oriented x 3, no focal deficits   REVIEW OF DATA  I reviewed the following data at the time of this encounter:  GI Procedures and Studies  December 2023 EGD Impression:               - No gross lesions in the entire esophagus.                            Biopsied. Dilated with [address] [place]  Maloney. Z-line                            regular, 35 cm from the incisors.                           - 2 cm hiatal hernia.                           - Multiple gastric polyps. Biopsied.                           - Gastritis. Biopsied.                           - No gross lesions in the duodenal bulb, in the                            first portion of the duodenum and in the second                            portion of the duodenum.  Pathology Diagnosis 1. Surgical [P], gastric ANTRAL AND OXYNTIC MUCOSA WITH FOCAL HYPEREMIA NO HELICOBACTER PYLORI IDENTIFIED. 2. Surgical [P], gastric polyps FUNDIC GLAND POLYPS. 3. Surgical [P], random esophageal UNREMARKABLE SQUAMOUS MUCOSA. NEGATIVE FOR EOSINOPHILIC ESOPHAGITIS.  Laboratory Studies   Reviewed those in epic  Imaging Studies  No relevant studies to review   ASSESSMENT  Ms. Brunjes is a 52 y.o. female with a pmh significant for breast cancer (status post lumpectomy and hysterectomy and BSO), renal transplant, OSA, hypertension, diabetes, hyperlipidemia, GERD, Diverticulosis, colon polyps (TA's).  The patient is seen today for evaluation and management of:  1. Gastroesophageal reflux disease without esophagitis   2. Bloating   3. Abdominal distention   4. Chronic constipation   5. Increased sputum production    The patient is hemodynamically and clinically stable at this time.  From a GI perspective, she seems to be doing well from a GERD standpoint.  We will continue her PPI at twice daily dosing for at least another month or 2 and then recommend decreasing her PPI after she is seen in follow-up by ENT.  We are placing referral to them today so that she can follow-up with them in regards to this increasing sputum production and blood-tinged taste that she is experiencing to make sure that there is no other sinus issues that are occurring.  I want her to have high-dose acid suppression therapy on board, so that so that there is no concern as to whether this is actually acid reflux related or not when she is evaluated by ENT.  For her persisting intermittent symptoms of bloating and abdominal distention, we are going to rule out EPI as well as SIBO.  She will not do the SIBO breath test for at least another few weeks, as I suspect she had antibiotics in her periprocedural time during recent BSO.  I also asked the patient to initiate Gas-X extra strength for a 2-week time.  To see if symptomatically this will help her as well.  Time will tell.  Hopefully her dysphagia symptoms did not recur otherwise we can repeat a dilation or consider esophageal manometry if it recurs quickly.  All patient questions were answered to the best of my ability, and the patient agrees to the aforementioned  plan of action with follow-up as indicated.   PLAN  Continue PPI twice daily for now - When patient returns after ENT evaluation will likely go ahead and start decreasing dosing to lowest effective dose, especially in the setting of her previous renal transplant [facility] to evaluate increased sputum and blood-tinged taste in her mouth Fecal elastase to be updated SIBO breath testing to be performed (no sooner than middle of February to at least 4 weeks since her surgery because of recent likely antibiotic use) Gas-X or Phazyme extra strength 3 times daily x 2-week trial   Orders Placed This Encounter  Procedures   Pancreatic elastase, fecal   Ambulatory referral to ENT    New Prescriptions   No medications on file   Modified Medications   Modified Medication Previous Medication   PANTOPRAZOLE (PROTONIX) 40 MG TABLET pantoprazole (PROTONIX) 40 MG tablet      Take 1 tablet (40 mg total) by mouth 2 (two) times daily.    Take 40 mg by mouth 2 (two) times daily.    Planned Follow Up No follow-ups on file.   Total Time in Face-to-Face and in Coordination of Care for patient including independent/personal interpretation/review of prior testing, medical history, examination, medication adjustment, communicating results with the patient directly, and documentation within the EHR is 25 minutes.   Corliss Parish, MD  [facility]ology [facility] Office # [phone]

## 2022-07-28 ENCOUNTER — Encounter: Payer: Self-pay | Admitting: Gastroenterology

## 2022-07-28 DIAGNOSIS — R140 Abdominal distension (gaseous): Secondary | ICD-10-CM | POA: Insufficient documentation

## 2022-07-28 DIAGNOSIS — R093 Abnormal sputum: Secondary | ICD-10-CM | POA: Insufficient documentation

## 2022-07-28 DIAGNOSIS — K219 Gastro-esophageal reflux disease without esophagitis: Secondary | ICD-10-CM | POA: Insufficient documentation

## 2022-07-30 ENCOUNTER — Other Ambulatory Visit: Payer: Medicare HMO

## 2022-07-30 DIAGNOSIS — R140 Abdominal distension (gaseous): Secondary | ICD-10-CM

## 2022-07-30 DIAGNOSIS — K219 Gastro-esophageal reflux disease without esophagitis: Secondary | ICD-10-CM

## 2022-08-06 LAB — PANCREATIC ELASTASE, FECAL: Pancreatic Elastase-1, Stool: 434 mcg/g

## 2022-08-10 ENCOUNTER — Encounter: Payer: Self-pay | Admitting: Gynecologic Oncology

## 2022-08-10 ENCOUNTER — Inpatient Hospital Stay: Payer: Medicare HMO | Attending: Hematology | Admitting: Gynecologic Oncology

## 2022-08-10 ENCOUNTER — Other Ambulatory Visit: Payer: Self-pay

## 2022-08-10 VITALS — BP 162/78 | HR 82 | Temp 98.4°F | Resp 16 | Wt 174.4 lb

## 2022-08-10 DIAGNOSIS — Z1509 Genetic susceptibility to other malignant neoplasm: Secondary | ICD-10-CM

## 2022-08-10 DIAGNOSIS — R519 Headache, unspecified: Secondary | ICD-10-CM | POA: Insufficient documentation

## 2022-08-10 DIAGNOSIS — Z1502 Genetic susceptibility to malignant neoplasm of ovary: Secondary | ICD-10-CM

## 2022-08-10 DIAGNOSIS — Z148 Genetic carrier of other disease: Secondary | ICD-10-CM

## 2022-08-10 DIAGNOSIS — C50312 Malignant neoplasm of lower-inner quadrant of left female breast: Secondary | ICD-10-CM | POA: Insufficient documentation

## 2022-08-10 DIAGNOSIS — Z7189 Other specified counseling: Secondary | ICD-10-CM

## 2022-08-10 DIAGNOSIS — R079 Chest pain, unspecified: Secondary | ICD-10-CM | POA: Insufficient documentation

## 2022-08-10 DIAGNOSIS — Z79899 Other long term (current) drug therapy: Secondary | ICD-10-CM | POA: Insufficient documentation

## 2022-08-10 DIAGNOSIS — N951 Menopausal and female climacteric states: Secondary | ICD-10-CM | POA: Insufficient documentation

## 2022-08-10 DIAGNOSIS — Z1501 Genetic susceptibility to malignant neoplasm of breast: Secondary | ICD-10-CM

## 2022-08-10 DIAGNOSIS — Z90722 Acquired absence of ovaries, bilateral: Secondary | ICD-10-CM

## 2022-08-10 NOTE — Progress Notes (Signed)
Gynecologic Oncology Return Clinic Visit  08/10/22  Reason for Visit: Follow-up after surgery  Treatment History: Oncology History Overview Note  Cancer Staging Malignant neoplasm of lower-inner quadrant of left breast in female, estrogen receptor positive ([place]) Staging form: Breast, AJCC 8th Edition - Clinical stage from 02/09/2021: Stage IA (cT1c, cN0, cM0, G2, ER+, PR+, HER2-) - Signed by Truitt Merle, MD on 02/20/2021 Stage prefix: Initial diagnosis Histologic grading system: 3 grade system - Pathologic stage from 03/02/2021: Stage IA (pT2, pN0, cM0, G2, ER+, PR+, HER2-, Oncotype DX score: 26) - Signed by Truitt Merle, MD on 03/21/2021 Stage prefix: Initial diagnosis Multigene prognostic tests performed: Oncotype DX Recurrence score range: Greater than or equal to 11 Histologic grading system: 3 grade system Residual tumor (R): R0 - None    Malignant neoplasm of lower-inner quadrant of left breast in female, estrogen receptor positive ([place])  02/07/2021 Mammogram   Exam: Left Diagnostic Mammogram; Left Breast Ultrasound  IMPRESSION: Irregular mass in left breast at 6:30, measuring 1.6 cm by mammogram, is highly suggestive of malignancy. Left axillary ultrasound is negative for lymphadenopathy.   02/09/2021 Cancer Staging   Staging form: Breast, AJCC 8th Edition - Clinical stage from 02/09/2021: Stage IA (cT1c, cN0, cM0, G2, ER+, PR+, HER2-) - Signed by Truitt Merle, MD on 02/20/2021 Stage prefix: Initial diagnosis Histologic grading system: 3 grade system   02/09/2021 Pathology Results   Diagnosis Breast, left, needle core biopsy, 6:30 o'clock 8cm fn - INVASIVE DUCTAL CARCINOMA WITH HISTIOCYTOID FEATURES. SEE NOTE Diagnosis Note Carcinoma measures 1 cm in greatest linear dimension and appears grade 2.  PROGNOSTIC INDICATORS Results: IMMUNOHISTOCHEMICAL AND MORPHOMETRIC ANALYSIS PERFORMED MANUALLY The tumor cells are EQUIVOCAL for Her2 (2+). Her2 by FISH will be performed and results  performed separately. Estrogen Receptor: >95%, POSITIVE, STRONG STAINING INTENSITY Progesterone Receptor: 90%, POSITIVE, STRONG STAINING INTENSITY Proliferation Marker Ki67: 20%  FLUORESCENCE IN-SITU HYBRIDIZATION Results: GROUP 5: HER2 **NEGATIVE** Equivocal form of amplification of the HER2 gene was detected in the IHC 2+ tissue sample received from this individual. HER2 FISH was performed by a technologist and cell imaging and analysis on the [employer]. RATIO OF HER2/CEN17 SIGNALS 1.30 AVERAGE HER2 COPY NUMBER PER CELL 1.75   02/15/2021 Initial Diagnosis   Malignant neoplasm of lower-inner quadrant of left breast in female, estrogen receptor positive ([place])   03/01/2021 Genetic Testing   Positive genetic testing: pathogenic variant detected in BRCA2 at [id number].  No other pathogenic variants detected in [facility] Custom[employer] Panel.  The report date is 03/19/2021.  The CustomNext-Cancer+RNAinsight panel offered by [facility] includes sequencing and rearrangement analysis for the following 47 genes:  APC, ATM, AXIN2, BARD1, BMPR1A, BRCA1, BRCA2, BRIP1, CDH1, CDK4, CDKN2A, CHEK2, DICER1, EPCAM, GREM1, HOXB13, MEN1, MLH1, MSH2, MSH3, MSH6, MUTYH, NBN, NF1, NF2, NTHL1, PALB2, PMS2, POLD1, POLE, PTEN, RAD51C, RAD51D, RECQL, RET, SDHA, SDHAF2, SDHB, SDHC, SDHD, SMAD4, SMARCA4, STK11, TP53, TSC1, TSC2, and VHL.  RNA data is routinely analyzed for use in variant interpretation for all genes.   03/02/2021 Cancer Staging   Staging form: Breast, AJCC 8th Edition - Pathologic stage from 03/02/2021: Stage IA (pT2, pN0, cM0, G2, ER+, PR+, HER2-, Oncotype DX score: 26) - Signed by Truitt Merle, MD on 03/21/2021 Stage prefix: Initial diagnosis Multigene prognostic tests performed: Oncotype DX Recurrence score range: Greater than or equal to 11 Histologic grading system: 3 grade system Residual tumor (R): R0 - None   05/02/2021 - 07/12/2021 Chemotherapy   Patient is on Treatment Plan : BREAST TC q21d  08/22/2021 Pathology Results   FINAL MICROSCOPIC DIAGNOSIS:   A. BREAST, LEFT, MASTECTOMY:  - Fibrocystic changes.  - Lumpectomy scar with hemosiderin deposition.  - No malignancy identified.   B. BREAST, RIGHT, MASTECTOMY:  - Fibrocystic changes with focal fibroadenomatoid change.  - No malignancy identified.     07/11/2022: Robotic assisted BSO  Interval History: Doing well.  Denies any significant abdominal or pelvic pain.  Has occasional twinges from time to time.  Reports baseline bowel and bladder function.  Tolerating p.o. without nausea or emesis.  Past Medical/Surgical History: Past Medical History:  Diagnosis Date   Allergy    pollen   Anemia    Anxiety    BRCA2 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 03/01/2021   Breast cancer [facility])    Cataract    surgical repair bilateral   Complication of anesthesia    not herself for 2 days after last surgery (brest lumpectomy)   Coronary artery disease    Depression    ESRD on hemodialysis ([place])    Home HD 5x per week- not on dialysis now had tramsplant 4/17   Family history of ovarian cancer 02/22/2021   GERD (gastroesophageal reflux disease)    Hearing loss 2017   right ear   History of blood transfusion    transfusion reaction   Hyperlipidemia    Hypertension    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 per pt-dr levy also II; Per patient 08/18/21 she is Type 2   Kidney transplant recipient    Neuromuscular disorder ([place])    NEUROPATHY   Sleep apnea     Past Surgical History:  Procedure Laterality Date   ABDOMINAL HYSTERECTOMY     BREAST EXCISIONAL BIOPSY Right 08/2015   BREAST LUMPECTOMY WITH RADIOACTIVE SEED LOCALIZATION Right 09/14/2015   Procedure: RIGHT BREAST LUMPECTOMY WITH RADIOACTIVE SEED LOCALIZATION;  Surgeon: Donnie Mesa, MD;  Location: [place];  Service: General;  Laterality: Right;   BREAST LUMPECTOMY WITH RADIOACTIVE SEED  LOCALIZATION Left 03/02/2021   Procedure: LEFT BREAST SEED LUMPECTOMY LEFT SENTINEL LYMPH NODE [place];  Surgeon: Erroll Luna, MD;  Location: [place];  Service: General;  Laterality: Left;  GEN AND PEC BLOCK   BREAST SURGERY Bilateral    biopsy bilateral   CARDIAC CATHETERIZATION     CATARACT EXTRACTION Bilateral    bilateral   CHOLECYSTECTOMY     CYST REMOVAL NECK     DIALYSIS FISTULA CREATION Left    EYE SURGERY Bilateral    lazer   kidney transplant     LEFT HEART CATH AND CORONARY ANGIOGRAPHY N/A 03/04/2019   Procedure: LEFT HEART CATH AND CORONARY ANGIOGRAPHY;  Surgeon: Martinique, Peter M, MD;  Location: [place] CV LAB;  Service: Cardiovascular;  Laterality: N/A;   LEFT HEART CATHETERIZATION WITH CORONARY ANGIOGRAM N/A 03/30/2014   Procedure: LEFT HEART CATHETERIZATION WITH CORONARY ANGIOGRAM;  Surgeon: Sinclair Grooms, MD;  Location[facility] CATH LAB;  Service: Cardiovascular;  Laterality: N/A;   LIPOMA EXCISION N/A 02/06/2017   Procedure: EXCISION POSTERIOR NECK SEBACEOUS CYST;  Surgeon: Coralie Keens, MD;  Location: [place];  Service: General;  Laterality: N/A;   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   Procedure: REPAIR OF ARTERIOVENOUS FISTULA ANEURYSM;  Surgeon: Serafina Mitchell, MD;  Location: [place];  Service: Vascular;  Laterality: Left;   REVISON OF ARTERIOVENOUS FISTULA Left 09/28/2013   Procedure: EXCISE ESCHAR LEFT ARM  ARTERIOVENOUS FISTULA  WITH PLICATION OF LEFT ARM ARTERIOVENOUS FISTULA;  Surgeon: Elam Dutch, MD;  [facility];  Service: Vascular;  Laterality: Left;   REVISON OF ARTERIOVENOUS FISTULA Left 08/26/2015   Procedure: RESECTION ANEURYSM OF LEFT ARM ARTERIOVENOUS FISTULA  ;  Surgeon: Serafina Mitchell, MD;  Location: [place];  Service: Vascular;  Laterality: Left;   ROBOTIC ASSISTED BILATERAL SALPINGO OOPHERECTOMY N/A 07/11/2022   Procedure: XI ROBOTIC ASSISTED BILATERAL SALPINGO OOPHORECTOMY;  Surgeon: Lafonda Mosses, MD;  Location: [facility];  Service: Gynecology;  Laterality: N/A;   SIMPLE MASTECTOMY WITH AXILLARY SENTINEL NODE BIOPSY Bilateral 08/22/2021   Procedure: BILATERAL SIMPLE MASTECTOMY;  Surgeon: Erroll Luna, MD;  Location: [facility] OR;  Service: General;  Laterality: Bilateral;   TUBAL LIGATION      Family History  Problem Relation Age of Onset   Diabetes Mother    Kidney disease Mother    Diabetes Father    Heart disease Father    Kidney disease Father    Hypertension Father    Diabetes Sister    Asthma Sister    Lupus Sister    Diabetes Brother    Diabetes Brother    Diabetes Brother    Diabetes Brother    Cancer Maternal Grandmother 54       ovarian cancer   Ovarian cancer Maternal Grandmother    Diabetes Maternal Grandfather    Diabetes Paternal Grandmother    Colon cancer Neg Hx    Colon polyps Neg Hx    Esophageal cancer Neg Hx    Rectal cancer Neg Hx    Stomach cancer Neg Hx    Breast cancer Neg Hx    Endometrial cancer Neg Hx    Pancreatic cancer Neg Hx    Prostate cancer Neg Hx    Inflammatory bowel disease Neg Hx    Liver disease Neg Hx     Social History   Socioeconomic History   Marital status: Legally Separated    Spouse name: Not on file   Number of children: 3   Years of education: 9   Highest education level: Not on file  Occupational History   Occupation: disabled  Tobacco Use   Smoking status: Never   Smokeless tobacco: Never  Vaping Use   Vaping Use: Never used  Substance and Sexual Activity   Alcohol use: Not Currently    Comment: Socially   Drug use: No   Sexual activity: Not Currently    Birth control/protection: None, Surgical  Other Topics Concern   Not on file  Social History Narrative   Lives in home with daughters, grand daughter, son-in-law   Caffeine use - 1 cup coffee sometimes    Social Determinants of Health   Financial Resource Strain: High Risk (02/22/2021)   Overall Financial Resource Strain (CARDIA)     Difficulty of Paying Living Expenses: Hard  Food Insecurity: Food Insecurity Present (02/22/2021)   Hunger Vital Sign    Worried About Running Out of Food in the Last Year: Sometimes true    Ran Out of Food in the Last Year: Sometimes true  Transportation Needs: No Transportation Needs (02/22/2021)   PRAPARE - Hydrologist (Medical): No    Lack of Transportation (Non-Medical): No  Physical Activity: Not on file  Stress: Not on file  Social Connections: Not on file    Current Medications:  Current Outpatient Medications:    Accu-Chek Softclix Lancets lancets, Use as instructed (Patient taking differently: 1  each by Other route See admin instructions. Use as instructed), Disp: 100 each, Rfl: 12   acetaminophen (TYLENOL) 500 MG tablet, Take 500 mg by mouth every 6 (six) hours as needed for mild pain, fever or headache., Disp: , Rfl:    acidophilus (RISAQUAD) CAPS capsule, Take 1 capsule by mouth daily., Disp: , Rfl:    Blood Glucose Monitoring Suppl (ACCU-CHEK AVIVA CONNECT) w/Device KIT, Check sugar 1x daily (Patient taking differently: 1 each by Other route See admin instructions. Check sugar 1x daily), Disp: 1 kit, Rfl: 0   carvedilol (COREG) 25 MG tablet, Take 25 mg by mouth 2 (two) times daily with a meal., Disp: , Rfl:    cetirizine (ZYRTEC) 10 MG tablet, Take 10 mg by mouth daily as needed for allergies., Disp: , Rfl:    cholecalciferol (VITAMIN D3) 25 MCG (1000 UNIT) tablet, Take 1,000 Units by mouth daily., Disp: , Rfl:    cyanocobalamin 1000 MCG tablet, Take 1,000 mcg by mouth daily., Disp: , Rfl:    DROPLET PEN NEEDLES 31G X 8 MM MISC, , Disp: , Rfl:    escitalopram (LEXAPRO) 10 MG tablet, Take 10 mg by mouth daily., Disp: , Rfl:    furosemide (LASIX) 40 MG tablet, Take 40 mg by mouth 2 (two) times daily as needed for fluid., Disp: , Rfl:    gabapentin (NEURONTIN) 300 MG capsule, Take 1 capsule (300 mg total) by mouth 2 (two) times daily. Start at 1 cap  once daily, and increase to twice daily if she tolerates. Continue 672m at night (Patient taking differently: Take 300-600 mg by mouth See admin instructions. Take 300 mg by mouth in the morning and 600 mg at night), Disp: 60 capsule, Rfl: 0   Garlic 1123XX123MG TABS, Take 100 mg by mouth daily., Disp: , Rfl:    glucose blood (ACCU-CHEK AVIVA PLUS) test strip, Check sugar 1x daily (Patient taking differently: 1 each by Other route See admin instructions. Check sugar 1x daily), Disp: 100 each, Rfl: 12   hydrALAZINE (APRESOLINE) 25 MG tablet, Take 25 mg by mouth 2 (two) times daily., Disp: , Rfl:    insulin glargine (LANTUS SOLOSTAR) 100 UNIT/ML Solostar Pen, Inject 25-30 Units into the skin See admin instructions. Inject 30 units into the skin in the morning and 25 units at bedtime, Disp: , Rfl:    Insulin Pen Needle (PEN NEEDLES) 30G X 8 MM MISC, 1 each by Does not apply route daily. E11.9, Disp: 90 each, Rfl: 0   magnesium oxide (MAG-OX) 400 MG tablet, Take 400 mg by mouth 2 (two) times daily., Disp: , Rfl:    multivitamin-lutein (OCUVITE-LUTEIN) CAPS capsule, Take 1 capsule by mouth daily., Disp: , Rfl:    mycophenolate (MYFORTIC) 180 MG EC tablet, Take 360 mg by mouth 2 (two) times daily., Disp: , Rfl:    oxyCODONE (OXY IR/ROXICODONE) 5 MG immediate release tablet, Take 1 tablet (5 mg total) by mouth every 6 (six) hours as needed for severe pain. For AFTER surgery only, do not take and drive (Patient taking differently: Take 5 mg by mouth 2 (two) times daily as needed for severe pain.), Disp: 15 tablet, Rfl: 0   pantoprazole (PROTONIX) 40 MG tablet, Take 1 tablet (40 mg total) by mouth 2 (two) times daily., Disp: 60 tablet, Rfl: 11   predniSONE (DELTASONE) 5 MG tablet, Take 5 mg by mouth daily. , Disp: , Rfl: 2   rosuvastatin (CRESTOR) 5 MG tablet, Take 5 mg by mouth at  bedtime., Disp: , Rfl:    senna-docusate (SENOKOT-S) 8.6-50 MG tablet, Take 2 tablets by mouth at bedtime. For AFTER surgery, do not  take if having diarrhea, Disp: 30 tablet, Rfl: 0   tirzepatide (MOUNJARO) 12.5 MG/0.5ML Pen, Inject 12.5 mg into the skin once a week., Disp: , Rfl:   Review of Systems: Denies appetite changes, fevers, chills, fatigue, unexplained weight changes. Denies hearing loss, neck lumps or masses, mouth sores, ringing in ears or voice changes. Denies cough or wheezing.  Denies shortness of breath. Denies chest pain or palpitations. Denies leg swelling. Denies abdominal distention, pain, blood in stools, constipation, diarrhea, nausea, vomiting, or early satiety. Denies pain with intercourse, dysuria, frequency, hematuria or incontinence. Denies hot flashes, pelvic pain, vaginal bleeding or vaginal discharge.   Denies joint pain, back pain or muscle pain/cramps. Denies itching, rash, or wounds. Denies dizziness, headaches, numbness or seizures. Denies swollen lymph nodes or glands, denies easy bruising or bleeding. Denies anxiety, depression, confusion, or decreased concentration.  Physical Exam: BP (!) 162/78 (BP Location: Other (Comment), Patient Position: Sitting) Comment: manual check Comment (BP Location): checked manually  Pulse 82   Temp 98.4 F (36.9 C)   Resp 16   Wt 174 lb 6.4 oz (79.1 kg)   SpO2 99%   BMI 32.95 kg/m  General: Alert, oriented, no acute distress. HEENT: Normocephalic, atraumatic, sclera anicteric. Chest: Unlabored breathing on room air. Abdomen: Obese, soft, nontender.  Normoactive bowel sounds.  No masses or hepatosplenomegaly appreciated.  Well-healed incisions.  Laboratory & Radiologic Studies: A.   OVARIES AND FALLOPIAN TUBES, BILATERAL, SALPINGO OOPHORECTOMY:  - Benign ovaries and fallopian tubes with no significant pathologic  changes   Cytology FINAL MICROSCOPIC DIAGNOSIS:  - Reactive mesothelial cells present   Assessment & Plan: Cynthia [name] is a 52 y.o. woman with ER positive breast cancer and BRCA2 mutation, now status post risk-reducing and  therapeutic BSO.  The patient is overall doing well.  Has recovered nicely from surgery.  Discussed continued expectations and restrictions.  Reviewed with her findings from surgery.  She was given copies of the pictures that I took during surgery.  Also reviewed pathology from surgery.  She was given a copy of her pathology report.  Discussed significant risk and reduction of ovarian and fallopian tube cancer but there is a small risk of primary peritoneal cancer.  I recommended that she see an oncologist or primary care provider annually for symptom review.  We discussed signs and symptoms that should prompt a phone call to be seen sooner.  16 minutes of total time was spent for this patient encounter, including preparation, face-to-face counseling with the patient and coordination of care, and documentation of the encounter.  Jeral Pinch, MD  Division of Gynecologic Oncology  Department of Obstetrics and Gynecology  [facility]

## 2022-08-10 NOTE — Patient Instructions (Signed)
It was good to see you today.  You are healing well from surgery.  Please remember, no heavy lifting for 6 weeks after surgery.  Please call the office if you need anything.

## 2022-08-23 ENCOUNTER — Other Ambulatory Visit: Payer: Self-pay

## 2022-08-23 ENCOUNTER — Inpatient Hospital Stay: Payer: Medicare HMO

## 2022-08-23 ENCOUNTER — Inpatient Hospital Stay (HOSPITAL_BASED_OUTPATIENT_CLINIC_OR_DEPARTMENT_OTHER): Payer: Medicare HMO | Admitting: Adult Health

## 2022-08-23 ENCOUNTER — Encounter: Payer: Self-pay | Admitting: Adult Health

## 2022-08-23 ENCOUNTER — Telehealth: Payer: Self-pay

## 2022-08-23 VITALS — BP 125/59 | HR 77 | Temp 97.7°F | Resp 18 | Ht 61.0 in | Wt 171.2 lb

## 2022-08-23 DIAGNOSIS — Z79899 Other long term (current) drug therapy: Secondary | ICD-10-CM | POA: Diagnosis not present

## 2022-08-23 DIAGNOSIS — Z170 Estrogen receptor positive status [ER+]: Secondary | ICD-10-CM | POA: Diagnosis not present

## 2022-08-23 DIAGNOSIS — C50312 Malignant neoplasm of lower-inner quadrant of left female breast: Secondary | ICD-10-CM

## 2022-08-23 DIAGNOSIS — N951 Menopausal and female climacteric states: Secondary | ICD-10-CM | POA: Diagnosis not present

## 2022-08-23 DIAGNOSIS — R079 Chest pain, unspecified: Secondary | ICD-10-CM | POA: Diagnosis not present

## 2022-08-23 DIAGNOSIS — R519 Headache, unspecified: Secondary | ICD-10-CM | POA: Diagnosis not present

## 2022-08-23 LAB — CMP (CANCER CENTER ONLY)
ALT: 8 U/L (ref 0–44)
AST: 13 U/L — ABNORMAL LOW (ref 15–41)
Albumin: 4 g/dL (ref 3.5–5.0)
Alkaline Phosphatase: 59 U/L (ref 38–126)
Anion gap: 3 — ABNORMAL LOW (ref 5–15)
BUN: 29 mg/dL — ABNORMAL HIGH (ref 6–20)
CO2: 26 mmol/L (ref 22–32)
Calcium: 9.4 mg/dL (ref 8.9–10.3)
Chloride: 110 mmol/L (ref 98–111)
Creatinine: 1.12 mg/dL — ABNORMAL HIGH (ref 0.44–1.00)
GFR, Estimated: 59 mL/min — ABNORMAL LOW (ref >60)
Glucose, Bld: 95 mg/dL (ref 70–99)
Potassium: 5.7 mmol/L — ABNORMAL HIGH (ref 3.5–5.1)
Sodium: 139 mmol/L (ref 135–145)
Total Bilirubin: 0.4 mg/dL (ref 0.3–1.2)
Total Protein: 5.9 g/dL — ABNORMAL LOW (ref 6.5–8.1)

## 2022-08-23 LAB — CBC WITH DIFFERENTIAL (CANCER CENTER ONLY)
Abs Immature Granulocytes: 0.02 10*3/uL (ref 0.00–0.07)
Basophils Absolute: 0 10*3/uL (ref 0.0–0.1)
Basophils Relative: 1 %
Eosinophils Absolute: 0.1 10*3/uL (ref 0.0–0.5)
Eosinophils Relative: 3 %
HCT: 35.8 % — ABNORMAL LOW (ref 36.0–46.0)
Hemoglobin: 11.7 g/dL — ABNORMAL LOW (ref 12.0–15.0)
Immature Granulocytes: 0 %
Lymphocytes Relative: 15 %
Lymphs Abs: 0.8 10*3/uL (ref 0.7–4.0)
MCH: 30.6 pg (ref 26.0–34.0)
MCHC: 32.7 g/dL (ref 30.0–36.0)
MCV: 93.7 fL (ref 80.0–100.0)
Monocytes Absolute: 0.4 10*3/uL (ref 0.1–1.0)
Monocytes Relative: 8 %
Neutro Abs: 3.6 10*3/uL (ref 1.7–7.7)
Neutrophils Relative %: 73 %
Platelet Count: 168 10*3/uL (ref 150–400)
RBC: 3.82 MIL/uL — ABNORMAL LOW (ref 3.87–5.11)
RDW: 13.1 % (ref 11.5–15.5)
WBC Count: 5 10*3/uL (ref 4.0–10.5)
nRBC: 0 % (ref 0.0–0.2)

## 2022-08-23 LAB — MAGNESIUM: Magnesium: 1.9 mg/dL (ref 1.7–2.4)

## 2022-08-23 NOTE — Telephone Encounter (Signed)
Called Pt's daughter to discuss lab results. Pt's potasium is elevated at 5.7. Results have been faxed with receipt confirmation to nephrologist Dr. Otelia Santee whose office stated they will contact Pt with next steps. Cynthia [name] verbalized understanding and stated she would relay message to Pt. Gave call back number with any questions.

## 2022-08-23 NOTE — Assessment & Plan Note (Addendum)
Cynthia [name] is a 52 year old BRCA2 positive woman with history of stage Ia ER/PR positive breast cancer diagnosed in August 2022 status post neoadjuvant chemotherapy followed by bilateral mastectomies, antiestrogen therapy with exemestane followed by tamoxifen that began in March 2023.  Cynthia [name] has no clinical signs of breast cancer recurrence.  Will continue on tamoxifen daily.    She does have several symptoms and several comorbidities that make it difficult to discern their etiology.  For her chest pain we obtained an EKG today and I sent a message to Dr. Harrell Gave to see if she can get her in quickly.  She does not have any tachycardia or tachypnea or oxygen desaturation, which is why I did not order a stat CT angiogram.  She knows that if this pain recurs to go to the ER.  She also has new posterior headaches.  These could be related to volume depletion from the hot flashes she has with the tamoxifen.  I recommended that she increase her water intake.  If these worsen she will let me know.  A complete neuroexam was conducted today and was normal.  We will see her back in 4 weeks for follow-up.

## 2022-08-23 NOTE — Progress Notes (Signed)
[facility] Follow up:    Cynthia Holms, NP [address] [place] [address]   DIAGNOSIS:  Cancer Staging  Malignant neoplasm of lower-inner quadrant of left breast in female, estrogen receptor positive ([place]) Staging form: Breast, AJCC 8th Edition - Clinical stage from 02/09/2021: Stage IA (cT1c, cN0, cM0, G2, ER+, PR+, HER2-) - Signed by Truitt Merle, MD on 02/20/2021 Stage prefix: Initial diagnosis Histologic grading system: 3 grade system - Pathologic stage from 03/02/2021: Stage IA (pT2, pN0, cM0, G2, ER+, PR+, HER2-, Oncotype DX score: 26) - Signed by Truitt Merle, MD on 03/21/2021 Stage prefix: Initial diagnosis Multigene prognostic tests performed: Oncotype DX Recurrence score range: Greater than or equal to 11 Histologic grading system: 3 grade system Residual tumor (R): R0 - None   SUMMARY OF ONCOLOGIC HISTORY: Oncology History Overview Note  Cancer Staging Malignant neoplasm of lower-inner quadrant of left breast in female, estrogen receptor positive ([place]) Staging form: Breast, AJCC 8th Edition - Clinical stage from 02/09/2021: Stage IA (cT1c, cN0, cM0, G2, ER+, PR+, HER2-) - Signed by Truitt Merle, MD on 02/20/2021 Stage prefix: Initial diagnosis Histologic grading system: 3 grade system - Pathologic stage from 03/02/2021: Stage IA (pT2, pN0, cM0, G2, ER+, PR+, HER2-, Oncotype DX score: 26) - Signed by Truitt Merle, MD on 03/21/2021 Stage prefix: Initial diagnosis Multigene prognostic tests performed: Oncotype DX Recurrence score range: Greater than or equal to 11 Histologic grading system: 3 grade system Residual tumor (R): R0 - None    Malignant neoplasm of lower-inner quadrant of left breast in female, estrogen receptor positive ([place])  02/07/2021 Mammogram   Exam: Left Diagnostic Mammogram; Left Breast Ultrasound  IMPRESSION: Irregular mass in left breast at 6:30, measuring 1.6 cm by mammogram, is highly suggestive of malignancy. Left axillary ultrasound is  negative for lymphadenopathy.   02/09/2021 Cancer Staging   Staging form: Breast, AJCC 8th Edition - Clinical stage from 02/09/2021: Stage IA (cT1c, cN0, cM0, G2, ER+, PR+, HER2-) - Signed by Truitt Merle, MD on 02/20/2021 Stage prefix: Initial diagnosis Histologic grading system: 3 grade system   02/09/2021 Pathology Results   Diagnosis Breast, left, needle core biopsy, 6:30 o'clock 8cm fn - INVASIVE DUCTAL CARCINOMA WITH HISTIOCYTOID FEATURES. SEE NOTE Diagnosis Note Carcinoma measures 1 cm in greatest linear dimension and appears grade 2.  PROGNOSTIC INDICATORS Results: IMMUNOHISTOCHEMICAL AND MORPHOMETRIC ANALYSIS PERFORMED MANUALLY The tumor cells are EQUIVOCAL for Her2 (2+). Her2 by FISH will be performed and results performed separately. Estrogen Receptor: >95%, POSITIVE, STRONG STAINING INTENSITY Progesterone Receptor: 90%, POSITIVE, STRONG STAINING INTENSITY Proliferation Marker Ki67: 20%  FLUORESCENCE IN-SITU HYBRIDIZATION Results: GROUP 5: HER2 **NEGATIVE** Equivocal form of amplification of the HER2 gene was detected in the IHC 2+ tissue sample received from this individual. HER2 FISH was performed by a technologist and cell imaging and analysis on the [employer]. RATIO OF HER2/CEN17 SIGNALS 1.30 AVERAGE HER2 COPY NUMBER PER CELL 1.75   02/15/2021 Initial Diagnosis   Malignant neoplasm of lower-inner quadrant of left breast in female, estrogen receptor positive ([place])   03/01/2021 Genetic Testing   Positive genetic testing: pathogenic variant detected in BRCA2 at [id number].  No other pathogenic variants detected in [facility] CustomNext Panel.  The report date is 03/19/2021.  The CustomNext-Cancer+RNAinsight panel offered by [facility] includes sequencing and rearrangement analysis for the following 47 genes:  APC, ATM, AXIN2, BARD1, BMPR1A, BRCA1, BRCA2, BRIP1, CDH1, CDK4, CDKN2A, CHEK2, DICER1, EPCAM, GREM1, HOXB13, MEN1, MLH1, MSH2, MSH3, MSH6, MUTYH, NBN, NF1, NF2, NTHL1,  PALB2, PMS2, POLD1, POLE, PTEN, RAD51C, RAD51D, RECQL, RET, SDHA, SDHAF2, SDHB, SDHC, SDHD, SMAD4, SMARCA4, STK11, TP53, TSC1, TSC2, and VHL.  RNA data is routinely analyzed for use in variant interpretation for all genes.   03/02/2021 Cancer Staging   Staging form: Breast, AJCC 8th Edition - Pathologic stage from 03/02/2021: Stage IA (pT2, pN0, cM0, G2, ER+, PR+, HER2-, Oncotype DX score: 26) - Signed by Truitt Merle, MD on 03/21/2021 Stage prefix: Initial diagnosis Multigene prognostic tests performed: Oncotype DX Recurrence score range: Greater than or equal to 11 Histologic grading system: 3 grade system Residual tumor (R): R0 - None   05/02/2021 - 07/12/2021 Chemotherapy   Patient is on Treatment Plan : BREAST TC q21d      08/22/2021 Pathology Results   FINAL MICROSCOPIC DIAGNOSIS:   A. BREAST, LEFT, MASTECTOMY:  - Fibrocystic changes.  - Lumpectomy scar with hemosiderin deposition.  - No malignancy identified.   B. BREAST, RIGHT, MASTECTOMY:  - Fibrocystic changes with focal fibroadenomatoid change.  - No malignancy identified.    08/2021 -  Anti-estrogen oral therapy   Exemestane x 6 months, discontinued due to arthralgias and changed to Tamoxifen in 04/2022   07/11/2022 Surgery   BSO with Dr. Berline Lopes: benign bilateral fallopian tubes and ovaries. (Pt already s/p TAH)     CURRENT THERAPY: Tamoxifen  INTERVAL HISTORY: Cynthia [name] 52 y.o. female returns for follow-up and evaluation of her estrogen positive breast cancer.  Cynthia [name] is currently taking tamoxifen daily and tolerates this moderately well with the exception of hot flashes.  Cynthia [name] has a myriad of new symptoms which include posterior headaches and fullness that Cynthia [name] feels mostly in the evening.  These resolved with Tylenol.  Cynthia [name] does note some balance changes.  Cynthia [name] says that her vision can sometimes get blurry but that this only happens when reading.  Cynthia [name] is due for her eye exam which will occur in 4 months.   Since her last  visit Cynthia [name] did undergo bilateral salpingo-oophorectomy and the pathology was benign.  Patient Active Problem List   Diagnosis Date Noted   Gastroesophageal reflux disease without esophagitis 07/28/2022   AKI (acute kidney injury/renal transplant on immunosuppresants  08/24/2021   BRCA2 gene mutation positive s/p bilateral masectomy  08/22/2021   PICC (peripherally inserted central catheter) in place 06/13/2021   Stage 3a chronic kidney disease (CKD) ([place]) 05/28/2021   Thrombocytopenia ([place]) 05/28/2021   Aortic atherosclerosis ([employer]) 03/07/2021   Hypercholesterolemia 03/07/2021   Family history of ovarian cancer 02/22/2021   Malignant neoplasm of lower-inner quadrant of left breast in female, estrogen receptor positive ([place]) 02/15/2021   Foot pain, bilateral 07/28/2020   Insulin dependent type 2 diabetes mellitus ([place]) 06/06/2019   DKA (diabetic ketoacidoses) 06/02/2019   HTN (hypertension) 03/04/2019   Abnormal nuclear stress test 03/04/2019   Diabetic nephropathy ([place]) 03/03/2019   History of renal transplant 11/18/2015   Diabetic retinopathy associated with type 2 diabetes mellitus ([place]) 04/13/2015   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   Pseudoaneurysm of arteriovenous dialysis fistula ([place]) 03/16/2014   Chronic constipation 07/28/2013   Paresthesias 07/28/2013   Diabetes mellitus type I ([place])    Kidney transplant status 05/28/2013   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/11/2013   Precordial chest pain 02/10/2013   Awaiting organ transplant 09/19/2012    is allergic to docetaxel, norvasc [amlodipine], and tape.  MEDICAL HISTORY: Past Medical History:  Diagnosis Date   Allergy    pollen   Anemia    Anxiety    BRCA2 gene mutation positive  in female 03/01/2021   Breast cancer [facility])    Cataract    surgical repair bilateral   Complication of anesthesia    not herself for 2 days after last surgery (brest lumpectomy)   Coronary artery disease    Depression    ESRD on hemodialysis ([place])    Home  HD 5x per week- not on dialysis now had tramsplant 4/17   Family history of ovarian cancer 02/22/2021   GERD (gastroesophageal reflux disease)    Hearing loss 2017   right ear   History of blood transfusion    transfusion reaction   Hyperlipidemia    Hypertension    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 per pt-dr levy also II; Per patient 08/18/21 Cynthia [name] is Type 2   Kidney transplant recipient    Leukocytosis 06/13/2021   Neuromuscular disorder ([place])    NEUROPATHY   Sleep apnea     SURGICAL HISTORY: Past Surgical History:  Procedure Laterality Date   ABDOMINAL HYSTERECTOMY     BREAST EXCISIONAL BIOPSY Right 08/2015   BREAST LUMPECTOMY WITH RADIOACTIVE SEED LOCALIZATION Right 09/14/2015   Procedure: RIGHT BREAST LUMPECTOMY WITH RADIOACTIVE SEED LOCALIZATION;  Surgeon: Donnie Mesa, MD;  Location: [place];  Service: General;  Laterality: Right;   BREAST LUMPECTOMY WITH RADIOACTIVE SEED LOCALIZATION Left 03/02/2021   Procedure: LEFT BREAST SEED LUMPECTOMY LEFT SENTINEL LYMPH NODE [place];  Surgeon: Erroll Luna, MD;  Location: [place];  Service: General;  Laterality: Left;  GEN AND PEC BLOCK   BREAST SURGERY Bilateral    biopsy bilateral   CARDIAC CATHETERIZATION     CATARACT EXTRACTION Bilateral    bilateral   CHOLECYSTECTOMY     CYST REMOVAL NECK     DIALYSIS FISTULA CREATION Left    EYE SURGERY Bilateral    lazer   kidney transplant     LEFT HEART CATH AND CORONARY ANGIOGRAPHY N/A 03/04/2019   Procedure: LEFT HEART CATH AND CORONARY ANGIOGRAPHY;  Surgeon: Martinique, Peter M, MD;  Location: [place] CV LAB;  Service: Cardiovascular;  Laterality: N/A;   LEFT HEART CATHETERIZATION WITH CORONARY ANGIOGRAM N/A 03/30/2014   Procedure: LEFT HEART CATHETERIZATION WITH CORONARY ANGIOGRAM;  Surgeon: Sinclair Grooms, MD;  Location: [facility] CATH LAB;  Service: Cardiovascular;  Laterality: N/A;    LIPOMA EXCISION N/A 02/06/2017   Procedure: EXCISION POSTERIOR NECK SEBACEOUS CYST;  Surgeon: Coralie Keens, MD;  Location: [place];  Service: General;  Laterality: N/A;   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   Procedure: REPAIR OF ARTERIOVENOUS FISTULA ANEURYSM;  Surgeon: Serafina Mitchell, MD;  Location: [facility];  Service: Vascular;  Laterality: Left;   REVISON OF ARTERIOVENOUS FISTULA Left 09/28/2013   Procedure: EXCISE ESCHAR LEFT ARM  ARTERIOVENOUS FISTULA WITH PLICATION OF LEFT ARM ARTERIOVENOUS FISTULA;  Surgeon: Elam Dutch, MD;  Location: [name];  Service: Vascular;  Laterality: Left;   REVISON OF ARTERIOVENOUS FISTULA Left 08/26/2015   Procedure: RESECTION ANEURYSM OF LEFT ARM ARTERIOVENOUS FISTULA  ;  Surgeon: Serafina Mitchell, MD;  Location: [place];  Service: Vascular;  Laterality: Left;   ROBOTIC ASSISTED BILATERAL SALPINGO OOPHERECTOMY N/A 07/11/2022   Procedure: XI ROBOTIC ASSISTED BILATERAL SALPINGO OOPHORECTOMY;  Surgeon: Lafonda Mosses, MD;  Location: [facility];  Service: Gynecology;  Laterality: N/A;   SIMPLE MASTECTOMY WITH AXILLARY SENTINEL NODE BIOPSY Bilateral 08/22/2021   Procedure: BILATERAL SIMPLE MASTECTOMY;  Surgeon: Erroll Luna, MD;  Location: [facility];  Service: General;  Laterality: Bilateral;   TUBAL LIGATION      SOCIAL HISTORY: Social History   Socioeconomic History   Marital status: Legally Separated    Spouse name: Not on file   Number of children: 3   Years of education: 9   Highest education level: Not on file  Occupational History   Occupation: disabled  Tobacco Use   Smoking status: Never   Smokeless tobacco: Never  Vaping Use   Vaping Use: Never used  Substance and Sexual Activity   Alcohol use: Not Currently    Comment: Socially   Drug use: No   Sexual activity: Not Currently    Birth control/protection: None, Surgical  Other Topics Concern   Not on file  Social History Narrative   Lives in home with daughters,  grand daughter, son-in-law   Caffeine use - 1 cup coffee sometimes    Social Determinants of Health   Financial Resource Strain: High Risk (02/22/2021)   Overall Financial Resource Strain (CARDIA)    Difficulty of Paying Living Expenses: Hard  Food Insecurity: Food Insecurity Present (02/22/2021)   Hunger Vital Sign    Worried About Running Out of Food in the Last Year: Sometimes true    Ran Out of Food in the Last Year: Sometimes true  Transportation Needs: No Transportation Needs (02/22/2021)   PRAPARE - Hydrologist (Medical): No    Lack of Transportation (Non-Medical): No  Physical Activity: Not on file  Stress: Not on file  Social Connections: Not on file  Intimate Partner Violence: Not on file    FAMILY HISTORY: Family History  Problem Relation Age of Onset   Diabetes Mother    Kidney disease Mother    Diabetes Father    Heart disease Father    Kidney disease Father    Hypertension Father    Diabetes Sister    Asthma Sister    Lupus Sister    Diabetes Brother    Diabetes Brother    Diabetes Brother    Diabetes Brother    Cancer Maternal Grandmother 62       ovarian cancer   Ovarian cancer Maternal Grandmother    Diabetes Maternal Grandfather    Diabetes Paternal Grandmother    Colon cancer Neg Hx    Colon polyps Neg Hx    Esophageal cancer Neg Hx    Rectal cancer Neg Hx    Stomach cancer Neg Hx    Breast cancer Neg Hx    Endometrial cancer Neg Hx    Pancreatic cancer Neg Hx    Prostate cancer Neg Hx    Inflammatory bowel disease Neg Hx    Liver disease Neg Hx     Review of Systems  Constitutional:  Positive for fatigue. Negative for appetite change, chills, fever and unexpected weight change.  HENT:   Negative for hearing loss, lump/mass and trouble swallowing.   Eyes:  Negative for eye problems and icterus.  Respiratory:  Negative for chest tightness, cough and shortness of breath.   Cardiovascular:  Negative for chest pain,  leg swelling and palpitations.  Gastrointestinal:  Negative for abdominal distention, abdominal pain, constipation, diarrhea, nausea and vomiting.  Endocrine: Positive for hot flashes.  Genitourinary:  Negative for difficulty urinating.   Musculoskeletal:  Negative for arthralgias.  Skin:  Negative for itching and rash.  Neurological:  Positive for dizziness and headaches. Negative for extremity weakness and numbness.  Hematological:  Negative  for adenopathy. Does not bruise/bleed easily.  Psychiatric/Behavioral:  Negative for depression. The patient is not nervous/anxious.       PHYSICAL EXAMINATION  ECOG PERFORMANCE STATUS: 1 - Symptomatic but completely ambulatory  Vitals:   08/23/22 1008  BP: (!) 125/59  Pulse: 77  Resp: 18  Temp: 97.7 F (36.5 C)  SpO2: 100%    Physical Exam Constitutional:      General: Cynthia [name] is not in acute distress.    Appearance: Normal appearance. Cynthia [name] is not toxic-appearing.  HENT:     Head: Normocephalic and atraumatic.     Mouth/Throat:     Mouth: Mucous membranes are moist.     Pharynx: No oropharyngeal exudate.  Eyes:     General: No scleral icterus.    Pupils: Pupils are equal, round, and reactive to light.  Cardiovascular:     Rate and Rhythm: Normal rate and regular rhythm.     Pulses: Normal pulses.     Heart sounds: Murmur heard.  Pulmonary:     Effort: Pulmonary effort is normal.     Breath sounds: Normal breath sounds.  Chest:     Comments: Status post bilateral mastectomy no sign of local recurrence. Abdominal:     General: Abdomen is flat. Bowel sounds are normal. There is no distension.     Palpations: Abdomen is soft.     Tenderness: There is no abdominal tenderness.  Musculoskeletal:        General: No swelling.     Cervical back: Neck supple.  Lymphadenopathy:     Cervical: No cervical adenopathy.  Skin:    General: Skin is warm and dry.     Findings: No rash.  Neurological:     General: No focal deficit present.      Mental Status: Cynthia [name] is alert and oriented to person, place, and time.     Cranial Nerves: No cranial nerve deficit.     Sensory: No sensory deficit.     Motor: No weakness.     Coordination: Coordination normal.     Gait: Gait normal.  Psychiatric:        Mood and Affect: Mood normal.        Behavior: Behavior normal.     LABORATORY DATA:  Pending.  I added CBC and c-Met along with a magnesium for today.   ASSESSMENT and THERAPY PLAN:   Malignant neoplasm of lower-inner quadrant of left breast in female, estrogen receptor positive ([place]) Cynthia [name] is a 52 year old BRCA2 positive woman with history of stage Ia ER/PR positive breast cancer diagnosed in August 2022 status post neoadjuvant chemotherapy followed by bilateral mastectomies, antiestrogen therapy with exemestane followed by tamoxifen that began in March 2023.  Cynthia [name] has no clinical signs of breast cancer recurrence.  Will continue on tamoxifen daily.    Cynthia [name] does have several symptoms and several comorbidities that make it difficult to discern their etiology.  For her chest pain we obtained an EKG today and I sent a message to Dr. Harrell Gave to see if Cynthia [name] can get her in quickly.  Cynthia [name] does not have any tachycardia or tachypnea or oxygen desaturation, which is why I did not order a stat CT angiogram.  Cynthia [name] knows that if this pain recurs to go to the ER.  Cynthia [name] also has new posterior headaches.  These could be related to volume depletion from the hot flashes Cynthia [name] has with the tamoxifen.  I recommended that Cynthia [name] increase her water intake.  If these worsen Cynthia [name] will  let me know.  A complete neuroexam was conducted today and was normal.  We will see her back in 4 weeks for follow-up.  All questions were answered. The patient knows to call the clinic with any problems, questions or concerns. We can certainly see the patient much sooner if necessary.  Total encounter time:45 minutes*in face-to-face visit time, chart review, lab review,  care coordination, order entry, and documentation of the encounter time.    Wilber Bihari, NP 08/23/22 11:05 AM Medical Oncology and Hematology [facility] [place], [place] [address] Tel. [phone]    Fax. [phone]  *Total Encounter Time as defined by the Centers for Medicare and Medicaid Services includes, in addition to the face-to-face time of a patient visit (documented in the note above) non-face-to-face time: obtaining and reviewing outside history, ordering and reviewing medications, tests or procedures, care coordination (communications with other health care professionals or caregivers) and documentation in the medical record. \

## 2022-08-24 ENCOUNTER — Telehealth: Payer: Self-pay | Admitting: *Deleted

## 2022-08-24 NOTE — Telephone Encounter (Signed)
Confirmed with daughter that Dr Joelyn Oms is patient's nephrologist. Labs faxed. Urgent: K 5.7

## 2022-08-31 NOTE — Progress Notes (Incomplete)
Cardiology Office Note:    Date:  03/05/2022   ID:  Cynthia [name], DOB 08/23/1970, MRN [id number]  PCP:  Arthur Holms, NP  Cardiologist:  Buford Dresser, MD  Referring MD: Arthur Holms, NP   CC: follow-up  History of Present Illness:    Cynthia [name] is a 52 y.o. female with a hx of left breast cancer, nonobstructive CAD, type 1 diabetes (diagnosed 1993), hypertension, hyperlipidemia, history of ESRD s/p kidney transplant 2017 who is seen for follow-up. She was initially seen as a new consult on 03/01/21 at the request of Arthur Holms, NP for the evaluation and management of mitral regurgitation and edema.  Today:  She says she has been doing good.  At home, her blood pressures will be around 782 systolic, typically. Some of her lowest readings have been around 55 - 70 systolic. When this occurs, she will sometimes experience dizziness and blackness in her vision. She adds that sometimes she will have pressures around 956 systolic when seated, but it will decrease when she stands.  She denies any palpitations, chest pain, shortness of breath, or peripheral edema. No headaches, syncope, orthopnea, or PND.   Past Medical History:  Diagnosis Date   Allergy    pollen   Anemia    Anxiety    BRCA2 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 03/01/2021   Breast cancer [facility])    Cataract    surgical repair bilateral   Complication of anesthesia    not herself for 2 days after last surgery (brest lumpectomy)   Coronary artery disease    Depression    ESRD on hemodialysis ([place])    Home HD 5x per week- not on dialysis now had tramsplant 4/17   Family history of ovarian cancer 02/22/2021   GERD (gastroesophageal reflux disease)    Hearing loss 2017   right ear   History of blood transfusion    transfusion reaction   Hyperlipidemia    Hypertension    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 per pt-dr levy also II; Per patient 08/18/21  she is Type 2   Kidney transplant recipient    Neuromuscular disorder ([place])    NEUROPATHY   Sleep apnea     Past Surgical History:  Procedure Laterality Date   ABDOMINAL HYSTERECTOMY     BREAST EXCISIONAL BIOPSY Right 08/2015   BREAST LUMPECTOMY WITH RADIOACTIVE SEED LOCALIZATION Right 09/14/2015   Procedure: RIGHT BREAST LUMPECTOMY WITH RADIOACTIVE SEED LOCALIZATION;  Surgeon: Donnie Mesa, MD;  Location: [place];  Service: General;  Laterality: Right;   BREAST LUMPECTOMY WITH RADIOACTIVE SEED LOCALIZATION Left 03/02/2021   Procedure: LEFT BREAST SEED LUMPECTOMY LEFT SENTINEL LYMPH NODE [place];  Surgeon: Erroll Luna, MD;  Location: [place];  Service: General;  Laterality: Left;  GEN AND PEC BLOCK   BREAST SURGERY Bilateral    biopsy bilateral   CARDIAC CATHETERIZATION     CATARACT EXTRACTION Bilateral    bilateral   CHOLECYSTECTOMY     CYST REMOVAL NECK     DIALYSIS FISTULA CREATION Left    EYE SURGERY Bilateral    lazer   kidney transplant     LEFT HEART CATH AND CORONARY ANGIOGRAPHY N/A 03/04/2019   Procedure: LEFT HEART CATH AND CORONARY ANGIOGRAPHY;  Surgeon: Martinique, Peter M, MD;  Location: [place] [facility];  Service: Cardiovascular;  Laterality: N/A;   LEFT HEART CATHETERIZATION WITH CORONARY ANGIOGRAM N/A 03/30/2014   Procedure: LEFT  HEART CATHETERIZATION WITH CORONARY ANGIOGRAM;  Surgeon: Sinclair Grooms, MD;  Location: [facility] CATH LAB;  Service: Cardiovascular;  Laterality: N/A;   LIPOMA EXCISION N/A 02/06/2017   Procedure: EXCISION POSTERIOR NECK SEBACEOUS CYST;  Surgeon: Coralie Keens, MD;  Location: [place];  Service: General;  Laterality: N/A;   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   Procedure: REPAIR OF ARTERIOVENOUS FISTULA ANEURYSM;  Surgeon: Serafina Mitchell, MD;  [facility];  Service: Vascular;  Laterality: Left;   REVISON OF ARTERIOVENOUS FISTULA Left 09/28/2013   Procedure: EXCISE ESCHAR LEFT  ARM  ARTERIOVENOUS FISTULA WITH PLICATION OF LEFT ARM ARTERIOVENOUS FISTULA;  Surgeon: Elam Dutch, MD;  Location: [place];  Service: Vascular;  Laterality: Left;   REVISON OF ARTERIOVENOUS FISTULA Left 08/26/2015   Procedure: RESECTION ANEURYSM OF LEFT ARM ARTERIOVENOUS FISTULA  ;  Surgeon: Serafina Mitchell, MD;  Location: [place];  Service: Vascular;  Laterality: Left;   SIMPLE MASTECTOMY WITH AXILLARY SENTINEL NODE BIOPSY Bilateral 08/22/2021   Procedure: BILATERAL SIMPLE MASTECTOMY;  Surgeon: Erroll Luna, MD;  Location: [place];  Service: General;  Laterality: Bilateral;   TUBAL LIGATION      Current Medications: Current Outpatient Medications on File Prior to Visit  Medication Sig   Accu-Chek Softclix Lancets lancets Use as instructed (Patient taking differently: 1 each by Other route See admin instructions. Use as instructed)   acetaminophen (TYLENOL) 500 MG tablet Take 500 mg by mouth every 6 (six) hours as needed for mild pain, fever or headache.   aspirin EC 81 MG EC tablet Take 1 tablet (81 mg total) by mouth daily.   Blood Glucose Monitoring Suppl (ACCU-CHEK AVIVA CONNECT) w/Device KIT Check sugar 1x daily (Patient taking differently: 1 each by Other route See admin instructions. Check sugar 1x daily)   carvedilol (COREG) 25 MG tablet Take 25 mg by mouth 2 (two) times daily with a meal.   cetirizine (ZYRTEC) 10 MG tablet Take 10 mg by mouth daily.   cholecalciferol (VITAMIN D3) 25 MCG (1000 UNIT) tablet Take 1,000 Units by mouth daily.   cyanocobalamin 1000 MCG tablet Take 1,000 mcg by mouth daily.   escitalopram (LEXAPRO) 10 MG tablet Take 10 mg by mouth daily.   exemestane (AROMASIN) 25 MG tablet TAKE 1 TABLET(25 MG) BY MOUTH DAILY AFTER BREAKFAST   furosemide (LASIX) 40 MG tablet Take 40 mg by mouth 2 (two) times daily as needed for fluid.   gabapentin (NEURONTIN) 300 MG capsule Take 1 capsule (300 mg total) by mouth 2 (two) times daily. Start at 1 cap once daily, and increase to  twice daily if she tolerates. Continue 660m at night (Patient taking differently: Take 300-600 mg by mouth See admin instructions. Take 300 mg by mouth in the morning and 600 mg at night)   glucose blood (ACCU-CHEK AVIVA PLUS) test strip Check sugar 1x daily (Patient taking differently: 1 each by Other route See admin instructions. Check sugar 1x daily)   insulin glargine (LANTUS SOLOSTAR) 100 UNIT/ML Solostar Pen Inject 25 Units into the skin 2 (two) times daily.   Insulin Pen Needle (PEN NEEDLES) 30G X 8 MM MISC 1 each by Does not apply route daily. E11.9   ketoconazole (NIZORAL) 2 % shampoo Apply 1 Application topically 2 (two) times a week.   magnesium oxide (MAG-OX) 400 MG tablet Take 400 mg by mouth 2 (two) times daily.   mycophenolate (MYFORTIC) 180 MG EC tablet Take 360 mg by mouth 2 (two) times  daily.   NOVOLOG FLEXPEN 100 UNIT/ML FlexPen Inject 5 Units into the skin 3 (three) times daily as needed (blood sugar of 200 or above).   oxyCODONE (OXY IR/ROXICODONE) 5 MG immediate release tablet Take 1 tablet (5 mg total) by mouth every 6 (six) hours as needed for severe pain. For AFTER surgery only, do not take and drive   pantoprazole (PROTONIX) 40 MG tablet Take 40 mg by mouth 2 (two) times daily.   predniSONE (DELTASONE) 5 MG tablet Take 5 mg by mouth daily.    rosuvastatin (CRESTOR) 5 MG tablet Take 5 mg by mouth at bedtime.   senna-docusate (SENOKOT-S) 8.6-50 MG tablet Take 2 tablets by mouth at bedtime. For AFTER surgery, do not take if having diarrhea   tacrolimus (PROGRAF) 1 MG capsule Take 3 mg by mouth daily.   tirzepatide [facility]) 7.5 MG/0.5ML Pen Inject 7.5 mg into the skin every Thursday.   No current facility-administered medications on file prior to visit.     Allergies:   Docetaxel, Norvasc [amlodipine], and Tape   Social History   Tobacco Use   Smoking status: Never   Smokeless tobacco: Never  Vaping Use   Vaping Use: Never used  Substance Use Topics   Alcohol  use: Not Currently    Comment: Socially   Drug use: No    Family History: family history includes Asthma in her sister; Cancer (age of onset: 55) in her maternal grandmother; Diabetes in her brother, brother, brother, brother, father, maternal grandfather, mother, paternal grandmother, and sister; Heart disease in her father; Hypertension in her father; Kidney disease in her father and mother; Lupus in her sister; Ovarian cancer in her maternal grandmother. There is no history of Colon cancer, Colon polyps, Esophageal cancer, Rectal cancer, Stomach cancer, Breast cancer, Endometrial cancer, Pancreatic cancer, or Prostate cancer.  ROS:   Please see the history of present illness.   (+) Occasional dizziness (+) Occasional black spots in vision  Additional pertinent ROS otherwise unremarkable.   EKGs/Labs/Other Studies Reviewed:    The following studies were reviewed today:  Per [place] records:  Echo 11/28/20--[place] -LVEF normal, mild LVH, EF 60-65% -mild LAE, normal RA -RV normal size/function. RVSP 39 mmHg -mild to moderate MR. Normal appearing valve -trace TR -no effusion or intracardiac mass, normal aorta/arch  Nuclear stress test 11/24/20 ([facility])--[place] -normal ECG at rest and stress -both rest and stress images normal -LVEF 63% with normal wall motion -no evidence of ischemia  Cath 03/04/2019 Prox LAD to Mid LAD lesion is 10% stenosed. Prox Cx to Mid Cx lesion is 20% stenosed. 1st Mrg lesion is 20% stenosed. Prox RCA to Mid RCA lesion is 10% stenosed. The left ventricular systolic function is normal. LV end diastolic pressure is normal. The left ventricular ejection fraction is 55-65% by visual estimate.   1. Minor nonobstructive CAD 2. Normal LV function 3. Normal LVEDP  Echo 01/06/2015 - Left ventricle: The cavity size was normal. Wall thickness was    increased in a pattern of mild LVH. Systolic function was normal.    The estimated ejection fraction  was in the range of 55% to 60%.    Wall motion was normal; there were no regional wall motion    abnormalities.  - Mitral valve: Calcified annulus. There was mild regurgitation.  - Atrial septum: No defect or patent foramen ovale was identified.   EKG:  EKG is personally reviewed.   03/05/22: not ordered today  08/08/21: NSR at 76 bpm 03/01/21  NSR, LAFB, t wave inversion I, aVL  Recent Labs: 07/05/2021: Magnesium 1.9 02/08/2022: ALT 13; BUN 46; Creatinine 2.13; Hemoglobin 11.9; Platelet Count 161; Potassium 4.3; Sodium 140  Recent Lipid Panel    Component Value Date/Time   CHOL 162 03/29/2014 0616   TRIG 284 (H) 03/29/2014 0616   HDL 31 (L) 03/29/2014 0616   CHOLHDL 5.2 03/29/2014 0616   VLDL 57 (H) 03/29/2014 0616   LDLCALC 74 03/29/2014 0616    Physical Exam:    VS:  BP 134/60 (BP Location: Right Arm, Patient Position: Sitting, Cuff Size: Large)   Pulse 74   Ht '5\' 1"'  (1.549 m)   Wt 185 lb 3.2 oz (84 kg)   SpO2 97%   BMI 34.99 kg/m     Wt Readings from Last 3 Encounters:  03/05/22 185 lb 3.2 oz (84 kg)  02/08/22 185 lb 1.6 oz (84 kg)  11/15/21 187 lb 3.2 oz (84.9 kg)    GEN: Well nourished, well developed in no acute distress HEENT: Normal, moist mucous membranes NECK: No JVD CARDIAC: regular rhythm, normal S1 and S2, no rubs or gallops. There is a prominent murmur from her fistula, and I do not appreciate a separate MR murmur VASCULAR: Radial and DP pulses 2+ bilaterally. No carotid bruits RESPIRATORY:  Clear to auscultation without rales, wheezing or rhonchi  ABDOMEN: Soft, non-tender, non-distended MUSCULOSKELETAL:  Ambulates independently SKIN: Warm and dry, trace bilateral edema NEUROLOGIC:  Alert and oriented x 3. No focal neuro deficits noted. PSYCHIATRIC:  Normal affect    ASSESSMENT:    1. Mitral valve insufficiency, unspecified etiology   2. Type 1 diabetes mellitus with other kidney complication (HCC)   3. Kidney transplant status   4.  Hypercholesterolemia   5. Primary hypertension   6. Aortic atherosclerosis (HCC)    PLAN:    mitral regurgitation -had echo, stress test done with Dr. Claudie Leach in [facility]. Records above. Noted mild-moderate MR -has loud bruit/murmur from fistula, but did not appreciate separate MR murmur today -appears euvolemic today  Type I diabetes, with kidney complications, now s/p kidney transplant -follows with Dr. Joelyn Oms at [facility] transplant nephrology  Hyperlipidemia with diabetes Aortic atherosclerosis noted on CT 06/01/2019 -on aspirin 81 mg, rosuvastatin -she is on mounjaro for diabetes, with benefit of potential weight loss  Hypertension: near goal today, with intermittent lows at home -continue carvedilol. If hypotension at home occurs more frequently, may need to decrease carvedilol. Suspect she may have some autonomic dysfunction as well given long history of type I diabetes. -furosemide is PRN  Cardiac risk counseling and prevention recommendations: -recommend heart healthy/Mediterranean diet, with whole grains, fruits, vegetable, fish, lean meats, nuts, and olive oil. Limit salt. -recommend moderate walking, 3-5 times/week for 30-50 minutes each session. Aim for at least 150 minutes.week. Goal should be pace of 3 miles/hours, or walking 1.5 miles in 30 minutes -recommend avoidance of tobacco products. Avoid excess alcohol. -ASCVD risk score: The 10-year ASCVD risk score (Arnett DK, et al., 2019) is: 4%   Values used to calculate the score:     Age: 16 years     Sex: Female     Is Non-Hispanic African American: No     Diabetic: Yes     Tobacco smoker: No     Systolic Blood Pressure: 591 mmHg     Is BP treated: Yes     HDL Cholesterol: 67 mg/dL     Total Cholesterol: 248 mg/dL  Plan for follow up: 6 months.   Buford Dresser, MD, PhD, [facility]    Medication Adjustments/Labs and Tests Ordered: Current medicines are  reviewed at length with the patient today.  Concerns regarding medicines are outlined above.  No orders of the defined types were placed in this encounter.  No orders of the defined types were placed in this encounter.  Patient Instructions  Medication Instructions:  Your physician recommends that you continue on your current medications as directed. Please refer to the Current Medication list given to you today.   *If you need a refill on your cardiac medications before your next appointment, please call your pharmacy*  Lab Work: NONE  Testing/Procedures: NONE  Follow-Up: At [facility], you and your health needs are our priority.  As part of our continuing mission to provide you with exceptional heart care, we have created designated Provider Care Teams.  These Care Teams include your primary Cardiologist (physician) and Advanced Practice Providers (APPs -  Physician Assistants and Nurse Practitioners) who all work together to provide you with the care you need, when you need it.  We recommend signing up for the patient portal called "MyChart".  Sign up information is provided on this After Visit Summary.  MyChart is used to connect with patients for Virtual Visits (Telemedicine).  Patients are able to view lab/test results, encounter notes, upcoming appointments, etc.  Non-urgent messages can be sent to your provider as well.   To learn more about what you can do with MyChart, go to [email or web address].    Your next appointment:   6 month(s)  The format for your next appointment:   In Person  Provider:   Buford Dresser, MD                   I,Breanna Adamick,acting as a scribe for Buford Dresser, MD.,have documented all relevant documentation on the behalf of Buford Dresser, MD,as directed by  Buford Dresser, MD while in the presence of Buford Dresser, MD.   I, Buford Dresser, MD, have reviewed all  documentation for this visit. The documentation on 03/05/22 for the exam, diagnosis, procedures, and orders are all accurate and complete.   Signed, Buford Dresser, MD PhD 03/05/2022    [place]

## 2022-09-03 ENCOUNTER — Ambulatory Visit (HOSPITAL_BASED_OUTPATIENT_CLINIC_OR_DEPARTMENT_OTHER): Payer: Medicare HMO | Admitting: Cardiology

## 2022-09-11 ENCOUNTER — Telehealth: Payer: Self-pay | Admitting: Adult Health

## 2022-09-11 NOTE — Telephone Encounter (Signed)
Per 2/28 LOS reached out to patient to make them aware of the time and date of appointment. Left voicemail

## 2022-09-13 ENCOUNTER — Ambulatory Visit: Payer: Medicare HMO | Attending: Urology | Admitting: Physical Therapy

## 2022-09-13 ENCOUNTER — Other Ambulatory Visit: Payer: Self-pay

## 2022-09-13 DIAGNOSIS — R279 Unspecified lack of coordination: Secondary | ICD-10-CM | POA: Diagnosis not present

## 2022-09-13 DIAGNOSIS — M5459 Other low back pain: Secondary | ICD-10-CM | POA: Diagnosis not present

## 2022-09-13 DIAGNOSIS — R293 Abnormal posture: Secondary | ICD-10-CM | POA: Diagnosis present

## 2022-09-13 DIAGNOSIS — R3915 Urgency of urination: Secondary | ICD-10-CM | POA: Diagnosis not present

## 2022-09-13 DIAGNOSIS — M6281 Muscle weakness (generalized): Secondary | ICD-10-CM | POA: Insufficient documentation

## 2022-09-13 DIAGNOSIS — M62838 Other muscle spasm: Secondary | ICD-10-CM | POA: Insufficient documentation

## 2022-09-13 NOTE — Therapy (Unsigned)
OUTPATIENT PHYSICAL THERAPY FEMALE PELVIC EVALUATION   Patient Name: Cynthia [name] MRN: [id number] DOB:[birth year], 52 y.o., female Today's Date: 09/14/2022  END OF SESSION:  PT End of Session - 09/14/22 0916     Visit Number 1    Date for PT Re-Evaluation 12/06/22    Authorization Type [employer] medicare    PT Start Time 1108    PT Stop Time 1154    PT Time Calculation (min) 46 min    Behavior During Therapy WFL for tasks assessed/performed             Past Medical History:  Diagnosis Date   Allergy    pollen   Anemia    Anxiety    BRCA2 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 03/01/2021   Breast cancer [facility])    Cataract    surgical repair bilateral   Complication of anesthesia    not herself for 2 days after last surgery (brest lumpectomy)   Coronary artery disease    Depression    ESRD on hemodialysis ([place])    Home HD 5x per week- not on dialysis now had tramsplant 4/17   Family history of ovarian cancer 02/22/2021   GERD (gastroesophageal reflux disease)    Hearing loss 2017   right ear   History of blood transfusion    transfusion reaction   Hyperlipidemia    Hypertension    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 per pt-dr levy also II; Per patient 08/18/21 she is Type 2   Kidney transplant recipient    Leukocytosis 06/13/2021   Neuromuscular disorder ([place])    NEUROPATHY   Sleep apnea    Past Surgical History:  Procedure Laterality Date   ABDOMINAL HYSTERECTOMY     BREAST EXCISIONAL BIOPSY Right 08/2015   BREAST LUMPECTOMY WITH RADIOACTIVE SEED LOCALIZATION Right 09/14/2015   Procedure: RIGHT BREAST LUMPECTOMY WITH RADIOACTIVE SEED LOCALIZATION;  Surgeon: Donnie Mesa, MD;  Location: [place];  Service: General;  Laterality: Right;   BREAST LUMPECTOMY WITH RADIOACTIVE SEED LOCALIZATION Left 03/02/2021   Procedure: LEFT BREAST SEED LUMPECTOMY LEFT SENTINEL LYMPH NODE [place];  Surgeon:  Erroll Luna, MD;  Location: [place];  Service: General;  Laterality: Left;  GEN AND PEC BLOCK   BREAST SURGERY Bilateral    biopsy bilateral   CARDIAC CATHETERIZATION     CATARACT EXTRACTION Bilateral    bilateral   CHOLECYSTECTOMY     CYST REMOVAL NECK     DIALYSIS FISTULA CREATION Left    EYE SURGERY Bilateral    lazer   kidney transplant     LEFT HEART CATH AND CORONARY ANGIOGRAPHY N/A 03/04/2019   Procedure: LEFT HEART CATH AND CORONARY ANGIOGRAPHY;  Surgeon: Martinique, Peter M, MD;  Location: [place] CV LAB;  Service: Cardiovascular;  Laterality: N/A;   LEFT HEART CATHETERIZATION WITH CORONARY ANGIOGRAM N/A 03/30/2014   Procedure: LEFT HEART CATHETERIZATION WITH CORONARY ANGIOGRAM;  Surgeon: Sinclair Grooms, MD;  Location: [facility] CATH LAB;  Service: Cardiovascular;  Laterality: N/A;   LIPOMA EXCISION N/A 02/06/2017   Procedure: EXCISION POSTERIOR NECK SEBACEOUS CYST;  Surgeon: Coralie Keens, MD;  Location: [place];  Service: General;  Laterality: N/A;   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   Procedure: REPAIR OF ARTERIOVENOUS FISTULA ANEURYSM;  Surgeon: Serafina Mitchell, MD;  Location: [place];  Service: Vascular;  Laterality: Left;   REVISON OF ARTERIOVENOUS FISTULA Left 09/28/2013   Procedure:  EXCISE ESCHAR LEFT ARM  ARTERIOVENOUS FISTULA WITH PLICATION OF LEFT ARM ARTERIOVENOUS FISTULA;  Surgeon: Elam Dutch, MD;  Location: [facility] OR;  Service: Vascular;  Laterality: Left;   REVISON OF ARTERIOVENOUS FISTULA Left 08/26/2015   Procedure: RESECTION ANEURYSM OF LEFT ARM ARTERIOVENOUS FISTULA  ;  Surgeon: Serafina Mitchell, MD;  Location: [place];  Service: Vascular;  Laterality: Left;   ROBOTIC ASSISTED BILATERAL SALPINGO OOPHERECTOMY N/A 07/11/2022   Procedure: XI ROBOTIC ASSISTED BILATERAL SALPINGO OOPHORECTOMY;  Surgeon: Lafonda Mosses, MD;  Location: [facility];  Service: Gynecology;  Laterality: N/A;   SIMPLE MASTECTOMY WITH AXILLARY SENTINEL  NODE BIOPSY Bilateral 08/22/2021   Procedure: BILATERAL SIMPLE MASTECTOMY;  Surgeon: Erroll Luna, MD;  Location: [place];  Service: General;  Laterality: Bilateral;   TUBAL LIGATION     Patient Active Problem List   Diagnosis Date Noted   Gastroesophageal reflux disease without esophagitis 07/28/2022   AKI (acute kidney injury/renal transplant on immunosuppresants  08/24/2021   BRCA2 gene mutation positive s/p bilateral masectomy  08/22/2021   PICC (peripherally inserted central catheter) in place 06/13/2021   Stage 3a chronic kidney disease (CKD) ([place]) 05/28/2021   Thrombocytopenia ([place]) 05/28/2021   Aortic atherosclerosis ([place]) 03/07/2021   Hypercholesterolemia 03/07/2021   Family history of ovarian cancer 02/22/2021   Malignant neoplasm of lower-inner quadrant of left breast in female, estrogen receptor positive ([place]) 02/15/2021   Foot pain, bilateral 07/28/2020   Insulin dependent type 2 diabetes mellitus ([place]) 06/06/2019   DKA (diabetic ketoacidoses) 06/02/2019   HTN (hypertension) 03/04/2019   Abnormal nuclear stress test 03/04/2019   Diabetic nephropathy ([place]) 03/03/2019   History of renal transplant 11/18/2015   Diabetic retinopathy associated with type 2 diabetes mellitus ([place]) 04/13/2015   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   Pseudoaneurysm of arteriovenous dialysis fistula ([place]) 03/16/2014   Chronic constipation 07/28/2013   Paresthesias 07/28/2013   Diabetes mellitus type I [facility])    Kidney transplant status 05/28/2013   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/11/2013   Precordial chest pain 02/10/2013   Awaiting organ transplant 09/19/2012    PCP: Arthur Holms   REFERRING PROVIDER: Ardis Hughs, MD   REFERRING DIAG:  N39.41 (ICD-10-CM) - Urge incontinence  R39.15 (ICD-10-CM) - Urgency of urination    THERAPY DIAG:  Abnormal posture  Muscle weakness (generalized)  Other muscle spasm  Unspecified lack of coordination  Rationale for Evaluation and Treatment:  Rehabilitation  ONSET DATE: 1 year  SUBJECTIVE:  SUBJECTIVE STATEMENT: Pt has been having a lot of urgency to get to the bathroom for the last year. Leakage happens after going to the bathroom and a little leakage after voiding.  Pt wears 2 pads/day Fluid intake: Yes: 3-4 bottles    PAIN:  Are you having pain? Yes NPRS scale: 3/10; up to 7/10 Pain location: Rt hip and shoulders  Pain type: throbbing Pain description: intermittent   Aggravating factors: doing chores, sometimes getting up out of bed it is sharp Relieving factors: sometimes rest or medicine  PRECAUTIONS: None  WEIGHT BEARING RESTRICTIONS: No  FALLS:  Has patient fallen in last 6 months? No  LIVING ENVIRONMENT: Lives with:  partner, daughter, granddaughter Lives in: House/apartment   OCCUPATION: no  PLOF: Independent  PATIENT GOALS: not have leakage and have more time to get to bathroom  PERTINENT HISTORY:  Abdominal hysterectomy, breast cancer 202 Sexual abuse: No  BOWEL MOVEMENT: Pain with bowel movement: Yes Type of bowel movement:Type (Bristol Stool Scale) hard, Frequency every day, and Strain Yes Fully empty rectum: No Leakage: No Pads: No Fiber supplement:   URINATION: Pain with urination: No Fully empty bladder: No leaks after going Stream: Strong Urgency: Yes: but can hold it Frequency: 3 hours Leakage: Urge to void, Walking to the bathroom, Coughing, and Sneezing Pads: Yes: 2/day  INTERCOURSE: Pain with intercourse: not sexually active d/t erectile disfunction   PREGNANCY: Vaginal deliveries 3   PROLAPSE: None   OBJECTIVE:   DIAGNOSTIC FINDINGS:    PATIENT SURVEYS:    PFIQ-7 = 33  COGNITION: Overall cognitive status: Within functional limits for tasks  assessed     SENSATION: Light touch: Appears intact Proprioception: Appears intact  MUSCLE LENGTH: Hamstrings: Right 60 deg; Left 70 deg Thomas test:   LUMBAR SPECIAL TESTS:  Straight leg raise test: Positive Rt hip and low back with Rt PSLR  FUNCTIONAL TESTS:  Single leg stand unsteady bil and pain on right side with <2 sec hold  GAIT:  Comments: short step length and slow cadence  POSTURE: weight shift right  PELVIC ALIGNMENT:   LUMBARAROM/PROM:  A/PROM A/PROM  eval  Flexion 75% +pain coming back to standing  Extension   Right lateral flexion 90%  Left lateral flexion 75%+pain  Right rotation Pain on Rt  Left rotation WFL    (Blank rows = not tested)  LOWER EXTREMITY ROM:  Passive ROM Right eval Left eval  Hip flexion 75% 90%  Hip extension    Hip abduction    Hip adduction    Hip internal rotation 80% 90%  Hip external rotation 75% 80%  Knee flexion    Knee extension    Ankle dorsiflexion    Ankle plantarflexion    Ankle inversion    Ankle eversion     (Blank rows = not tested)  LOWER EXTREMITY MMT:  MMT Right eval Left eval  Hip flexion    Hip extension    Hip abduction 4/5 4/5  Hip adduction 4/5 4/5  Hip internal rotation    Hip external rotation    Knee flexion    Knee extension    Ankle dorsiflexion    Ankle plantarflexion    Ankle inversion    Ankle eversion     PALPATION:   General  lumbar and gluteals tight; Rt SI and gluteals tender to palpation, hamstirngs and adductors tight Rt>Lt                External Perineal Exam can do kegel  with gross assessment outside clothing                             Internal Pelvic Floor DEFERRED  Patient confirms identification and approves PT to assess internal pelvic floor and treatment DEFERRED DUE TO TIME  PELVIC MMT:   MMT eval  Vaginal   Internal Anal Sphincter   External Anal Sphincter   Puborectalis   Diastasis Recti   (Blank rows = not tested)         TONE: na  PROLAPSE: na  TODAY'S TREATMENT:                                                                                                                              DATE: 09/13/22  EVAL    PATIENT EDUCATION:  Education details: discussed POC Person educated: Patient Education method: Explanation Education comprehension: verbalized understanding and needs further education  HOME EXERCISE PROGRAM:not issued today   ASSESSMENT:  CLINICAL IMPRESSION: Patient is a 52 y.o. female who was seen today for physical therapy evaluation and treatment for urinary incontinence. Pt arrived late and had complex history so unable to do full pelvic assessment today and will continue at follow up visit.  Pt does have decreased ROM and pain in low back.  Pt demonstrates hip weakness and decreased flexibility of hamstrings and gluteals as noted above. Pt able to do kegel but with holding her breath and this was assessed outside of clothing.  Based on history and gross core assessment pt is likely having some tension in the pelvic floor and will benefit from working on coordination of these muscles.  Pt will benefit from skilled PT to address all above impairments and return to full functional activity  OBJECTIVE IMPAIRMENTS: decreased activity tolerance, decreased coordination, decreased endurance, decreased ROM, decreased strength, increased muscle spasms, impaired flexibility, impaired tone, postural dysfunction, and pain.   ACTIVITY LIMITATIONS: continence and toileting  PARTICIPATION LIMITATIONS: cleaning, interpersonal relationship, and community activity  PERSONAL FACTORS: Age and 3+ comorbidities: Abdominal hysterectomy, breast cancer 2022, 3 vaginal deliveries  are also affecting patient's functional outcome.   REHAB POTENTIAL: Excellent  CLINICAL DECISION MAKING: Evolving/moderate complexity  EVALUATION COMPLEXITY: Moderate   GOALS: Goals reviewed with patient? Yes  SHORT TERM  GOALS: Target date: 10/08/22  Ind with how to correctly do a kegel and able to relax and bulge Baseline: Goal status: INITIAL  2.  Ind with healthy bladder and toileting habits Baseline:  Goal status: INITIAL   LONG TERM GOALS: Target date: 12/06/22  Pt will be independent with advanced HEP to maintain improvements made throughout therapy  Baseline:  Goal status: INITIAL  2.  Pt will report 80% reduction of pain due to improvements in posture, strength, and muscle length  Baseline:  Goal status: INITIAL  3.  Pt will have 80% reduced leakage during a typical day  Baseline:  Goal status: INITIAL  4.  Pt will have to use 1 liner or pads per day  Baseline:  Goal status: INITIAL  5.  Pt will be able to void without leakage afterwards due to improved pelvic floor coordination  Baseline:  Goal status: INITIAL    PLAN:  PT FREQUENCY: 1-2x/week  PT DURATION: 12 weeks  PLANNED INTERVENTIONS: Therapeutic exercises, Therapeutic activity, Neuromuscular re-education, Balance training, Gait training, Patient/Family education, Self Care, Joint mobilization, Dry Needling, Electrical stimulation, Cryotherapy, Moist heat, Taping, Traction, Biofeedback, Manual therapy, and Re-evaluation  PLAN FOR NEXT SESSION: internal assessment, stretching and ROM with breathing   Camillo Flaming Lanny Lipkin, PT 09/14/2022, 9:17 AM

## 2022-09-19 ENCOUNTER — Other Ambulatory Visit: Payer: Self-pay | Admitting: *Deleted

## 2022-09-19 DIAGNOSIS — Z170 Estrogen receptor positive status [ER+]: Secondary | ICD-10-CM

## 2022-09-19 NOTE — Therapy (Signed)
OUTPATIENT PHYSICAL THERAPY FEMALE PELVIC TREATMENT   Patient Name: Cynthia [name] MRN: [id number] DOB:1971-01-01, 52 y.o., female Today's Date: 09/20/2022  END OF SESSION:  PT End of Session - 09/20/22 1406     Visit Number 2    Date for PT Re-Evaluation 12/06/22    Authorization Type [employer] medicare    PT Start Time 1406    PT Stop Time 1444    PT Time Calculation (min) 38 min              Past Medical History:  Diagnosis Date   Allergy    pollen   Anemia    Anxiety    BRCA2 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 03/01/2021   Breast cancer [facility])    Cataract    surgical repair bilateral   Complication of anesthesia    not herself for 2 days after last surgery (brest lumpectomy)   Coronary artery disease    Depression    ESRD on hemodialysis ([place])    Home HD 5x per week- not on dialysis now had tramsplant 4/17   Family history of ovarian cancer 02/22/2021   GERD (gastroesophageal reflux disease)    Hearing loss 2017   right ear   History of blood transfusion    transfusion reaction   Hyperlipidemia    Hypertension    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 per pt-dr levy also II; Per patient 08/18/21 she is Type 2   Kidney transplant recipient    Leukocytosis 06/13/2021   Neuromuscular disorder ([place])    NEUROPATHY   Sleep apnea    Past Surgical History:  Procedure Laterality Date   ABDOMINAL HYSTERECTOMY     BREAST EXCISIONAL BIOPSY Right 08/2015   BREAST LUMPECTOMY WITH RADIOACTIVE SEED LOCALIZATION Right 09/14/2015   Procedure: RIGHT BREAST LUMPECTOMY WITH RADIOACTIVE SEED LOCALIZATION;  Surgeon: Donnie Mesa, MD;  Location: [place];  Service: General;  Laterality: Right;   BREAST LUMPECTOMY WITH RADIOACTIVE SEED LOCALIZATION Left 03/02/2021   Procedure: LEFT BREAST SEED LUMPECTOMY LEFT SENTINEL LYMPH NODE [place];  Surgeon: Erroll Luna, MD;  Location: [place];   Service: General;  Laterality: Left;  GEN AND PEC BLOCK   BREAST SURGERY Bilateral    biopsy bilateral   CARDIAC CATHETERIZATION     CATARACT EXTRACTION Bilateral    bilateral   CHOLECYSTECTOMY     CYST REMOVAL NECK     DIALYSIS FISTULA CREATION Left    EYE SURGERY Bilateral    lazer   kidney transplant     LEFT HEART CATH AND CORONARY ANGIOGRAPHY N/A 03/04/2019   Procedure: LEFT HEART CATH AND CORONARY ANGIOGRAPHY;  Surgeon: Martinique, Peter M, MD;  Location: [place] CV LAB;  Service: Cardiovascular;  Laterality: N/A;   LEFT HEART CATHETERIZATION WITH CORONARY ANGIOGRAM N/A 03/30/2014   Procedure: LEFT HEART CATHETERIZATION WITH CORONARY ANGIOGRAM;  Surgeon: Sinclair Grooms, MD;  Location[facility] CATH LAB;  Service: Cardiovascular;  Laterality: N/A;   LIPOMA EXCISION N/A 02/06/2017   Procedure: EXCISION POSTERIOR NECK SEBACEOUS CYST;  Surgeon: Coralie Keens, MD;  Location: [place];  Service: General;  Laterality: N/A;   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   Procedure: REPAIR OF ARTERIOVENOUS FISTULA ANEURYSM;  Surgeon: Serafina Mitchell, MD;  Location: [place];  Service: Vascular;  Laterality: Left;   REVISON OF ARTERIOVENOUS FISTULA Left 09/28/2013   Procedure: EXCISE ESCHAR LEFT ARM  ARTERIOVENOUS FISTULA WITH PLICATION  OF LEFT ARM ARTERIOVENOUS FISTULA;  Surgeon: Elam Dutch, MD;  Location: [facility] OR;  Service: Vascular;  Laterality: Left;   REVISON OF ARTERIOVENOUS FISTULA Left 08/26/2015   Procedure: RESECTION ANEURYSM OF LEFT ARM ARTERIOVENOUS FISTULA  ;  Surgeon: Serafina Mitchell, MD;  Location: [place];  Service: Vascular;  Laterality: Left;   ROBOTIC ASSISTED BILATERAL SALPINGO OOPHERECTOMY N/A 07/11/2022   Procedure: XI ROBOTIC ASSISTED BILATERAL SALPINGO OOPHORECTOMY;  Surgeon: Lafonda Mosses, MD;  Location: [facility];  Service: Gynecology;  Laterality: N/A;   SIMPLE MASTECTOMY WITH AXILLARY SENTINEL NODE BIOPSY Bilateral 08/22/2021   Procedure: BILATERAL  SIMPLE MASTECTOMY;  Surgeon: Erroll Luna, MD;  Location: [place];  Service: General;  Laterality: Bilateral;   TUBAL LIGATION     Patient Active Problem List   Diagnosis Date Noted   Gastroesophageal reflux disease without esophagitis 07/28/2022   AKI (acute kidney injury/renal transplant on immunosuppresants  08/24/2021   BRCA2 gene mutation positive s/p bilateral masectomy  08/22/2021   PICC (peripherally inserted central catheter) in place 06/13/2021   Stage 3a chronic kidney disease (CKD) ([place]) 05/28/2021   Thrombocytopenia ([place]) 05/28/2021   Aortic atherosclerosis ([place]) 03/07/2021   Hypercholesterolemia 03/07/2021   Family history of ovarian cancer 02/22/2021   Malignant neoplasm of lower-inner quadrant of left breast in female, estrogen receptor positive ([place]) 02/15/2021   Foot pain, bilateral 07/28/2020   Insulin dependent type 2 diabetes mellitus ([place]) 06/06/2019   DKA (diabetic ketoacidoses) 06/02/2019   HTN (hypertension) 03/04/2019   Abnormal nuclear stress test 03/04/2019   Diabetic nephropathy ([place]) 03/03/2019   History of renal transplant 11/18/2015   Diabetic retinopathy associated with type 2 diabetes mellitus ([place]) 04/13/2015   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   Pseudoaneurysm of arteriovenous dialysis fistula ([place]) 03/16/2014   Chronic constipation 07/28/2013   Paresthesias 07/28/2013   Diabetes mellitus type I ([place])    Kidney transplant status 05/28/2013   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/11/2013   Precordial chest pain 02/10/2013   Awaiting organ transplant 09/19/2012    PCP: Arthur Holms   REFERRING PROVIDER: Ardis Hughs, MD   REFERRING DIAG:  N39.41 (ICD-10-CM) - Urge incontinence  R39.15 (ICD-10-CM) - Urgency of urination    THERAPY DIAG:  Abnormal posture  Muscle weakness (generalized)  Other muscle spasm  Unspecified lack of coordination  Other low back pain  Rationale for Evaluation and Treatment: Rehabilitation  ONSET DATE: 1  year  SUBJECTIVE:  SUBJECTIVE STATEMENT: Pt has been having a lot of urgency to get to the bathroom for the last year. Leakage happens after going to the bathroom and a little leakage after voiding.  Pt wears 2 pads/day Pt is having the same and a lot of pain in back and legs  Fluid intake: Yes: 3-4 bottles    PAIN:  Are you having pain? Yes NPRS scale: 8/10 Pain location: low back  Pain type: radiating Pain description: intermittent   Aggravating factors: doing chores, sometimes getting up out of bed it is sharp Relieving factors: sometimes rest or medicine  PRECAUTIONS: None  WEIGHT BEARING RESTRICTIONS: No  FALLS:  Has patient fallen in last 6 months? No  LIVING ENVIRONMENT: Lives with:  partner, daughter, granddaughter Lives in: House/apartment   OCCUPATION: no  PLOF: Independent  PATIENT GOALS: not have leakage and have more time to get to bathroom  PERTINENT HISTORY:  Abdominal hysterectomy, breast cancer 202 Sexual abuse: No  BOWEL MOVEMENT: Pain with bowel movement: Yes Type of bowel movement:Type (Bristol Stool Scale) hard, Frequency every day, and Strain Yes Fully empty rectum: No Leakage: No Pads: No Fiber supplement:   URINATION: Pain with urination: No Fully empty bladder: No leaks after going Stream: Strong Urgency: Yes: but can hold it Frequency: 3 hours Leakage: Urge to void, Walking to the bathroom, Coughing, and Sneezing Pads: Yes: 2/day  INTERCOURSE: Pain with intercourse: not sexually active d/t erectile disfunction   PREGNANCY: Vaginal deliveries 3   PROLAPSE: None   OBJECTIVE:   DIAGNOSTIC FINDINGS:    PATIENT SURVEYS:    PFIQ-7 = 33  COGNITION: Overall cognitive status: Within functional limits for tasks  assessed     SENSATION: Light touch: Appears intact Proprioception: Appears intact  MUSCLE LENGTH: Hamstrings: Right 60 deg; Left 70 deg Thomas test:   LUMBAR SPECIAL TESTS:  Straight leg raise test: Positive Rt hip and low back with Rt PSLR  FUNCTIONAL TESTS:  Single leg stand unsteady bil and pain on right side with <2 sec hold  GAIT:  Comments: short step length and slow cadence  POSTURE: weight shift right  PELVIC ALIGNMENT:   LUMBARAROM/PROM:  A/PROM A/PROM  eval  Flexion 75% +pain coming back to standing  Extension   Right lateral flexion 90%  Left lateral flexion 75%+pain  Right rotation Pain on Rt  Left rotation WFL    (Blank rows = not tested)  LOWER EXTREMITY ROM:  Passive ROM Right eval Left eval  Hip flexion 75% 90%  Hip extension    Hip abduction    Hip adduction    Hip internal rotation 80% 90%  Hip external rotation 75% 80%  Knee flexion    Knee extension    Ankle dorsiflexion    Ankle plantarflexion    Ankle inversion    Ankle eversion     (Blank rows = not tested)  LOWER EXTREMITY MMT:  MMT Right eval Left eval  Hip flexion    Hip extension    Hip abduction 4/5 4/5  Hip adduction 4/5 4/5  Hip internal rotation    Hip external rotation    Knee flexion    Knee extension    Ankle dorsiflexion    Ankle plantarflexion    Ankle inversion    Ankle eversion     PALPATION:   General  lumbar and gluteals tight; Rt SI and gluteals tender to palpation, hamstirngs and adductors tight Rt>Lt  External Perineal Exam can do kegel with gross assessment outside clothing                             Internal Pelvic Floor   Patient confirms identification and approves PT to assess internal pelvic floor and treatment Yes  PELVIC MMT:   MMT eval  Vaginal 3/5 x 5 reps; decreased hold after 4 sec but not able to fully relax after hold  Internal Anal Sphincter   External Anal Sphincter   Puborectalis   Diastasis Recti    (Blank rows = not tested)        TONE: na  PROLAPSE: na  TODAY'S TREATMENT:                                                                                                                              DATE: 09/20/22  Internal assess and stretch to levators and obturator Rt More than Lt - can feel stool in rectum Lumbar soft tissue release and cupping Trigger Point Dry-Needling  Treatment instructions: Expect mild to moderate muscle soreness. S/S of pneumothorax if dry needled over a lung field, and to seek immediate medical attention should they occur. Patient verbalized understanding of these instructions and education.  Patient Consent Given: Yes Education handout provided: Yes Muscles treated: lumbar multifidi Electrical stimulation performed: No Parameters: N/A Treatment response/outcome: increased soft tissue length  Exercises:  Knee to chest Hip rotation Belly breathing  PATIENT EDUCATION:  Education details: toileting techniques, deep breathing, Access Code: [email or web address] Person educated: Patient Education method: Explanation Education comprehension: verbalized understanding and needs further education  HOME EXERCISE PROGRAM: Access Code: [email or web address] URL: [email or web address] Date: 09/22/2022 Prepared by: Jari Favre  Exercises - Supine Hip Internal and External Rotation  - 1 x daily - 7 x weekly - 1 sets - 10 reps - 5 sec hold - Hooklying Single Knee to Chest Stretch  - 1 x daily - 7 x weekly - 1 sets - 5 reps - 20 sec hold  Patient Education - Trigger Point Dry Needling   ASSESSMENT:  CLINICAL IMPRESSION: Patient was given internal pelvic assessment and demonstrates high tone and posterior wall is hard and bulging due to constipation.  Based on these findings, pt given soft tissue and treatment to release muscle tension and stretches with breathing to improve muscle lengthening.  Pt will benefit from skilled PT to continue to address  improved bowel and bladder function.  OBJECTIVE IMPAIRMENTS: decreased activity tolerance, decreased coordination, decreased endurance, decreased ROM, decreased strength, increased muscle spasms, impaired flexibility, impaired tone, postural dysfunction, and pain.   ACTIVITY LIMITATIONS: continence and toileting  PARTICIPATION LIMITATIONS: cleaning, interpersonal relationship, and community activity  PERSONAL FACTORS: Age and 3+ comorbidities: Abdominal hysterectomy, breast cancer 2022, 3 vaginal deliveries  are also affecting patient's functional outcome.   REHAB POTENTIAL: Excellent  CLINICAL DECISION MAKING: Evolving/moderate complexity  EVALUATION COMPLEXITY: Moderate   GOALS:  Goals reviewed with patient? Yes  SHORT TERM GOALS: Target date: 10/08/22  Ind with how to correctly do a kegel and able to relax and bulge Baseline: Goal status: INITIAL  2.  Ind with healthy bladder and toileting habits Baseline:  Goal status: INITIAL   LONG TERM GOALS: Target date: 12/06/22  Pt will be independent with advanced HEP to maintain improvements made throughout therapy  Baseline:  Goal status: INITIAL  2.  Pt will report 80% reduction of pain due to improvements in posture, strength, and muscle length  Baseline:  Goal status: INITIAL  3.  Pt will have 80% reduced leakage during a typical day  Baseline:  Goal status: INITIAL  4.  Pt will have to use 1 liner or pads per day  Baseline:  Goal status: INITIAL  5.  Pt will be able to void without leakage afterwards due to improved pelvic floor coordination  Baseline:  Goal status: INITIAL    PLAN:  PT FREQUENCY: 1-2x/week  PT DURATION: 12 weeks  PLANNED INTERVENTIONS: Therapeutic exercises, Therapeutic activity, Neuromuscular re-education, Balance training, Gait training, Patient/Family education, Self Care, Joint mobilization, Dry Needling, Electrical stimulation, Cryotherapy, Moist heat, Taping, Traction, Biofeedback,  Manual therapy, and Re-evaluation  PLAN FOR NEXT SESSION: f/u on dry needling and bowel movements, stretching and ROM with breathing, abdominal fascial release, internal STM as needed   [employer], PT 09/22/2022, 7:23 AM

## 2022-09-20 ENCOUNTER — Inpatient Hospital Stay: Payer: Medicare HMO | Attending: Hematology

## 2022-09-20 ENCOUNTER — Inpatient Hospital Stay (HOSPITAL_BASED_OUTPATIENT_CLINIC_OR_DEPARTMENT_OTHER): Payer: Medicare HMO | Admitting: Adult Health

## 2022-09-20 ENCOUNTER — Ambulatory Visit: Payer: Medicare HMO | Admitting: Physical Therapy

## 2022-09-20 ENCOUNTER — Other Ambulatory Visit: Payer: Self-pay | Admitting: Hematology

## 2022-09-20 ENCOUNTER — Other Ambulatory Visit: Payer: Self-pay

## 2022-09-20 ENCOUNTER — Encounter: Payer: Self-pay | Admitting: Adult Health

## 2022-09-20 VITALS — BP 162/64 | HR 85 | Temp 97.6°F | Resp 18 | Ht 61.0 in | Wt 174.9 lb

## 2022-09-20 DIAGNOSIS — R519 Headache, unspecified: Secondary | ICD-10-CM | POA: Diagnosis not present

## 2022-09-20 DIAGNOSIS — Z170 Estrogen receptor positive status [ER+]: Secondary | ICD-10-CM | POA: Diagnosis not present

## 2022-09-20 DIAGNOSIS — C50312 Malignant neoplasm of lower-inner quadrant of left female breast: Secondary | ICD-10-CM | POA: Insufficient documentation

## 2022-09-20 DIAGNOSIS — M5459 Other low back pain: Secondary | ICD-10-CM

## 2022-09-20 DIAGNOSIS — R293 Abnormal posture: Secondary | ICD-10-CM

## 2022-09-20 DIAGNOSIS — M6281 Muscle weakness (generalized): Secondary | ICD-10-CM

## 2022-09-20 DIAGNOSIS — R279 Unspecified lack of coordination: Secondary | ICD-10-CM

## 2022-09-20 DIAGNOSIS — M62838 Other muscle spasm: Secondary | ICD-10-CM

## 2022-09-20 DIAGNOSIS — Z9013 Acquired absence of bilateral breasts and nipples: Secondary | ICD-10-CM | POA: Diagnosis not present

## 2022-09-20 LAB — CMP (CANCER CENTER ONLY)
ALT: 12 U/L (ref 0–44)
AST: 14 U/L — ABNORMAL LOW (ref 15–41)
Albumin: 4 g/dL (ref 3.5–5.0)
Alkaline Phosphatase: 56 U/L (ref 38–126)
Anion gap: 3 — ABNORMAL LOW (ref 5–15)
BUN: 35 mg/dL — ABNORMAL HIGH (ref 6–20)
CO2: 30 mmol/L (ref 22–32)
Calcium: 10.1 mg/dL (ref 8.9–10.3)
Chloride: 107 mmol/L (ref 98–111)
Creatinine: 1.47 mg/dL — ABNORMAL HIGH (ref 0.44–1.00)
GFR, Estimated: 42 mL/min — ABNORMAL LOW (ref >60)
Glucose, Bld: 92 mg/dL (ref 70–99)
Potassium: 5.1 mmol/L (ref 3.5–5.1)
Sodium: 140 mmol/L (ref 135–145)
Total Bilirubin: 0.4 mg/dL (ref 0.3–1.2)
Total Protein: 6.4 g/dL — ABNORMAL LOW (ref 6.5–8.1)

## 2022-09-20 LAB — CBC WITH DIFFERENTIAL (CANCER CENTER ONLY)
Abs Immature Granulocytes: 0.01 10*3/uL (ref 0.00–0.07)
Basophils Absolute: 0 10*3/uL (ref 0.0–0.1)
Basophils Relative: 0 %
Eosinophils Absolute: 0.1 10*3/uL (ref 0.0–0.5)
Eosinophils Relative: 2 %
HCT: 38.2 % (ref 36.0–46.0)
Hemoglobin: 12.2 g/dL (ref 12.0–15.0)
Immature Granulocytes: 0 %
Lymphocytes Relative: 24 %
Lymphs Abs: 1.2 10*3/uL (ref 0.7–4.0)
MCH: 30.1 pg (ref 26.0–34.0)
MCHC: 31.9 g/dL (ref 30.0–36.0)
MCV: 94.3 fL (ref 80.0–100.0)
Monocytes Absolute: 0.5 10*3/uL (ref 0.1–1.0)
Monocytes Relative: 9 %
Neutro Abs: 3.3 10*3/uL (ref 1.7–7.7)
Neutrophils Relative %: 65 %
Platelet Count: 152 10*3/uL (ref 150–400)
RBC: 4.05 MIL/uL (ref 3.87–5.11)
RDW: 12.6 % (ref 11.5–15.5)
WBC Count: 5.2 10*3/uL (ref 4.0–10.5)
nRBC: 0 % (ref 0.0–0.2)

## 2022-09-20 NOTE — Progress Notes (Signed)
[facility] Follow up:    Arthur Holms, NP [address] [place] [address]   DIAGNOSIS:  Cancer Staging  Malignant neoplasm of lower-inner quadrant of left breast in female, estrogen receptor positive ([place]) Staging form: Breast, AJCC 8th Edition - Clinical stage from 02/09/2021: Stage IA (cT1c, cN0, cM0, G2, ER+, PR+, HER2-) - Signed by Truitt Merle, MD on 02/20/2021 Stage prefix: Initial diagnosis Histologic grading system: 3 grade system - Pathologic stage from 03/02/2021: Stage IA (pT2, pN0, cM0, G2, ER+, PR+, HER2-, Oncotype DX score: 26) - Signed by Truitt Merle, MD on 03/21/2021 Stage prefix: Initial diagnosis Multigene prognostic tests performed: Oncotype DX Recurrence score range: Greater than or equal to 11 Histologic grading system: 3 grade system Residual tumor (R): R0 - None   SUMMARY OF ONCOLOGIC HISTORY: Oncology History Overview Note  Cancer Staging Malignant neoplasm of lower-inner quadrant of left breast in female, estrogen receptor positive ([place]) Staging form: Breast, AJCC 8th Edition - Clinical stage from 02/09/2021: Stage IA (cT1c, cN0, cM0, G2, ER+, PR+, HER2-) - Signed by Truitt Merle, MD on 02/20/2021 Stage prefix: Initial diagnosis Histologic grading system: 3 grade system - Pathologic stage from 03/02/2021: Stage IA (pT2, pN0, cM0, G2, ER+, PR+, HER2-, Oncotype DX score: 26) - Signed by Truitt Merle, MD on 03/21/2021 Stage prefix: Initial diagnosis Multigene prognostic tests performed: Oncotype DX Recurrence score range: Greater than or equal to 11 Histologic grading system: 3 grade system Residual tumor (R): R0 - None    Malignant neoplasm of lower-inner quadrant of left breast in female, estrogen receptor positive ([place])  02/07/2021 Mammogram   Exam: Left Diagnostic Mammogram; Left Breast Ultrasound  IMPRESSION: Irregular mass in left breast at 6:30, measuring 1.6 cm by mammogram, is highly suggestive of malignancy. Left axillary ultrasound is  negative for lymphadenopathy.   02/09/2021 Cancer Staging   Staging form: Breast, AJCC 8th Edition - Clinical stage from 02/09/2021: Stage IA (cT1c, cN0, cM0, G2, ER+, PR+, HER2-) - Signed by Truitt Merle, MD on 02/20/2021 Stage prefix: Initial diagnosis Histologic grading system: 3 grade system   02/09/2021 Pathology Results   Diagnosis Breast, left, needle core biopsy, 6:30 o'clock 8cm fn - INVASIVE DUCTAL CARCINOMA WITH HISTIOCYTOID FEATURES. SEE NOTE Diagnosis Note Carcinoma measures 1 cm in greatest linear dimension and appears grade 2.  PROGNOSTIC INDICATORS Results: IMMUNOHISTOCHEMICAL AND MORPHOMETRIC ANALYSIS PERFORMED MANUALLY The tumor cells are EQUIVOCAL for Her2 (2+). Her2 by FISH will be performed and results performed separately. Estrogen Receptor: >95%, POSITIVE, STRONG STAINING INTENSITY Progesterone Receptor: 90%, POSITIVE, STRONG STAINING INTENSITY Proliferation Marker Ki67: 20%  FLUORESCENCE IN-SITU HYBRIDIZATION Results: GROUP 5: HER2 **NEGATIVE** Equivocal form of amplification of the HER2 gene was detected in the IHC 2+ tissue sample received from this individual. HER2 FISH was performed by a technologist and cell imaging and analysis on the [employer]. RATIO OF HER2/CEN17 SIGNALS 1.30 AVERAGE HER2 COPY NUMBER PER CELL 1.75   02/15/2021 Initial Diagnosis   Malignant neoplasm of lower-inner quadrant of left breast in female, estrogen receptor positive ([place])   03/01/2021 Genetic Testing   Positive genetic testing: pathogenic variant detected in BRCA2 at [id number].  No other pathogenic variants detected in [facility] Custom[facility] Panel.  The report date is 03/19/2021.  The CustomNext-Cancer+RNAinsight panel offered by [facility] includes sequencing and rearrangement analysis for the following 47 genes:  APC, ATM, AXIN2, BARD1, BMPR1A, BRCA1, BRCA2, BRIP1, CDH1, CDK4, CDKN2A, CHEK2, DICER1, EPCAM, GREM1, HOXB13, MEN1, MLH1, MSH2, MSH3, MSH6, MUTYH, NBN, NF1, NF2, NTHL1,  PALB2, PMS2, POLD1, POLE, PTEN, RAD51C, RAD51D, RECQL, RET, SDHA, SDHAF2, SDHB, SDHC, SDHD, SMAD4, SMARCA4, STK11, TP53, TSC1, TSC2, and VHL.  RNA data is routinely analyzed for use in variant interpretation for all genes.   03/02/2021 Cancer Staging   Staging form: Breast, AJCC 8th Edition - Pathologic stage from 03/02/2021: Stage IA (pT2, pN0, cM0, G2, ER+, PR+, HER2-, Oncotype DX score: 26) - Signed by Truitt Merle, MD on 03/21/2021 Stage prefix: Initial diagnosis Multigene prognostic tests performed: Oncotype DX Recurrence score range: Greater than or equal to 11 Histologic grading system: 3 grade system Residual tumor (R): R0 - None   05/02/2021 - 07/12/2021 Chemotherapy   Patient is on Treatment Plan : BREAST TC q21d      08/22/2021 Pathology Results   FINAL MICROSCOPIC DIAGNOSIS:   A. BREAST, LEFT, MASTECTOMY:  - Fibrocystic changes.  - Lumpectomy scar with hemosiderin deposition.  - No malignancy identified.   B. BREAST, RIGHT, MASTECTOMY:  - Fibrocystic changes with focal fibroadenomatoid change.  - No malignancy identified.    08/2021 -  Anti-estrogen oral therapy   Exemestane x 6 months, discontinued due to arthralgias and changed to Tamoxifen in 04/2022   07/11/2022 Surgery   BSO with Dr. Berline Lopes: benign bilateral fallopian tubes and ovaries. (Pt already s/p TAH)     CURRENT THERAPY: Tamoxifen  INTERVAL HISTORY: Cynthia [name] 52 y.o. female returns for follow-up of her estrogen positive left-sided breast cancer accompanied by spanish interpreter.  She continues on tamoxifen daily.  She is status post bilateral mastectomies and no longer undergoes annual mammograms.  Her last appointment she had an elevated potassium level of 5.7.  Her labs today indicate that this level has improved to 5.1.  Her kidney function remained stable.  She says she has some urine leakage and sees a specialist for this.  She has occasional hot flashes that are manageable for her. She has not had a  pap smear because she had a hysterectomy in 2001 in [place].    She is BRCA2 positive. She has undergone mastectomies and BSO.  No FH of pancreatic cancer.  She notes occasional headaches however these started when she lost her glasses.  She is working to get in with an eye specialist to get glasses reordered.   Patient Active Problem List   Diagnosis Date Noted   Gastroesophageal reflux disease without esophagitis 07/28/2022   AKI (acute kidney injury/renal transplant on immunosuppresants  08/24/2021   BRCA2 gene mutation positive s/p bilateral masectomy  08/22/2021   PICC (peripherally inserted central catheter) in place 06/13/2021   Stage 3a chronic kidney disease (CKD) ([place]) 05/28/2021   Thrombocytopenia ([place]) 05/28/2021   Aortic atherosclerosis ([place]) 03/07/2021   Hypercholesterolemia 03/07/2021   Family history of ovarian cancer 02/22/2021   Malignant neoplasm of lower-inner quadrant of left breast in female, estrogen receptor positive ([place]) 02/15/2021   Foot pain, bilateral 07/28/2020   Insulin dependent type 2 diabetes mellitus ([place]) 06/06/2019   DKA (diabetic ketoacidoses) 06/02/2019   HTN (hypertension) 03/04/2019   Abnormal nuclear stress test 03/04/2019   Diabetic nephropathy ([place]) 03/03/2019   History of renal transplant 11/18/2015   Diabetic retinopathy associated with type 2 diabetes mellitus ([place]) 04/13/2015   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   Pseudoaneurysm of arteriovenous dialysis fistula ([place]) 03/16/2014   Chronic constipation 07/28/2013   Paresthesias 07/28/2013   Diabetes mellitus type I [facility])    Kidney transplant status 05/28/2013   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/11/2013   Precordial chest pain 02/10/2013   Awaiting organ  transplant 09/19/2012    is allergic to docetaxel, norvasc [amlodipine], and tape.  MEDICAL HISTORY: Past Medical History:  Diagnosis Date   Allergy    pollen   Anemia    Anxiety    BRCA2 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 03/01/2021   Breast  cancer [facility])    Cataract    surgical repair bilateral   Complication of anesthesia    not herself for 2 days after last surgery (brest lumpectomy)   Coronary artery disease    Depression    ESRD on hemodialysis ([place])    Home HD 5x per week- not on dialysis now had tramsplant 4/17   Family history of ovarian cancer 02/22/2021   GERD (gastroesophageal reflux disease)    Hearing loss 2017   right ear   History of blood transfusion    transfusion reaction   Hyperlipidemia    Hypertension    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 per pt-dr levy also II; Per patient 08/18/21 she is Type 2   Kidney transplant recipient    Leukocytosis 06/13/2021   Neuromuscular disorder ([place])    NEUROPATHY   Sleep apnea     SURGICAL HISTORY: Past Surgical History:  Procedure Laterality Date   ABDOMINAL HYSTERECTOMY     BREAST EXCISIONAL BIOPSY Right 08/2015   BREAST LUMPECTOMY WITH RADIOACTIVE SEED LOCALIZATION Right 09/14/2015   Procedure: RIGHT BREAST LUMPECTOMY WITH RADIOACTIVE SEED LOCALIZATION;  Surgeon: Donnie Mesa, MD;  Location: [place];  Service: General;  Laterality: Right;   BREAST LUMPECTOMY WITH RADIOACTIVE SEED LOCALIZATION Left 03/02/2021   Procedure: LEFT BREAST SEED LUMPECTOMY LEFT SENTINEL LYMPH NODE [place];  Surgeon: Erroll Luna, MD;  Location: [place];  Service: General;  Laterality: Left;  GEN AND PEC BLOCK   BREAST SURGERY Bilateral    biopsy bilateral   CARDIAC CATHETERIZATION     CATARACT EXTRACTION Bilateral    bilateral   CHOLECYSTECTOMY     CYST REMOVAL NECK     DIALYSIS FISTULA CREATION Left    EYE SURGERY Bilateral    [facility]   kidney transplant     LEFT HEART CATH AND CORONARY ANGIOGRAPHY N/A 03/04/2019   Procedure: LEFT HEART CATH AND CORONARY ANGIOGRAPHY;  Surgeon: Martinique, Peter M, MD;  Location: [facility] CV LAB;  Service: Cardiovascular;  Laterality: N/A;   LEFT  HEART CATHETERIZATION WITH CORONARY ANGIOGRAM N/A 03/30/2014   Procedure: LEFT HEART CATHETERIZATION WITH CORONARY ANGIOGRAM;  Surgeon: Sinclair Grooms, MD;  Location[facility] CATH LAB;  Service: Cardiovascular;  Laterality: N/A;   LIPOMA EXCISION N/A 02/06/2017   Procedure: EXCISION POSTERIOR NECK SEBACEOUS CYST;  Surgeon: Coralie Keens, MD;  Location: [place];  Service: General;  Laterality: N/A;   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   Procedure: REPAIR OF ARTERIOVENOUS FISTULA ANEURYSM;  Surgeon: Serafina Mitchell, MD;  Location: [facility];  Service: Vascular;  Laterality: Left;   REVISON OF ARTERIOVENOUS FISTULA Left 09/28/2013   Procedure: EXCISE ESCHAR LEFT ARM  ARTERIOVENOUS FISTULA WITH PLICATION OF LEFT ARM ARTERIOVENOUS FISTULA;  Surgeon: Elam Dutch, MD;  Location: [place];  Service: Vascular;  Laterality: Left;   REVISON OF ARTERIOVENOUS FISTULA Left 08/26/2015   Procedure: RESECTION ANEURYSM OF LEFT ARM ARTERIOVENOUS FISTULA  ;  Surgeon: Serafina Mitchell, MD;  Location: [place];  Service: Vascular;  Laterality: Left;   ROBOTIC ASSISTED BILATERAL SALPINGO OOPHERECTOMY N/A 07/11/2022   Procedure: XI ROBOTIC  ASSISTED BILATERAL SALPINGO OOPHORECTOMY;  Surgeon: Lafonda Mosses, MD;  Location: [facility];  Service: Gynecology;  Laterality: N/A;   SIMPLE MASTECTOMY WITH AXILLARY SENTINEL NODE BIOPSY Bilateral 08/22/2021   Procedure: BILATERAL SIMPLE MASTECTOMY;  Surgeon: Erroll Luna, MD;  Location: [place];  Service: General;  Laterality: Bilateral;   TUBAL LIGATION      SOCIAL HISTORY: Social History   Socioeconomic History   Marital status: Legally Separated    Spouse name: Not on file   Number of children: 3   Years of education: 9   Highest education level: Not on file  Occupational History   Occupation: disabled  Tobacco Use   Smoking status: Never   Smokeless tobacco: Never  Vaping Use   Vaping Use: Never used  Substance and Sexual Activity   Alcohol  use: Not Currently    Comment: Socially   Drug use: No   Sexual activity: Not Currently    Birth control/protection: None, Surgical  Other Topics Concern   Not on file  Social History Narrative   Lives in home with daughters, grand daughter, son-in-law   Caffeine use - 1 cup coffee sometimes    Social Determinants of Health   Financial Resource Strain: High Risk (02/22/2021)   Overall Financial Resource Strain (CARDIA)    Difficulty of Paying Living Expenses: Hard  Food Insecurity: Food Insecurity Present (02/22/2021)   Hunger Vital Sign    Worried About Running Out of Food in the Last Year: Sometimes true    Ran Out of Food in the Last Year: Sometimes true  Transportation Needs: No Transportation Needs (02/22/2021)   PRAPARE - Hydrologist (Medical): No    Lack of Transportation (Non-Medical): No  Physical Activity: Not on file  Stress: Not on file  Social Connections: Not on file  Intimate Partner Violence: Not on file    FAMILY HISTORY: Family History  Problem Relation Age of Onset   Diabetes Mother    Kidney disease Mother    Diabetes Father    Heart disease Father    Kidney disease Father    Hypertension Father    Diabetes Sister    Asthma Sister    Lupus Sister    Diabetes Brother    Diabetes Brother    Diabetes Brother    Diabetes Brother    Cancer Maternal Grandmother 55       ovarian cancer   Ovarian cancer Maternal Grandmother    Diabetes Maternal Grandfather    Diabetes Paternal Grandmother    Colon cancer Neg Hx    Colon polyps Neg Hx    Esophageal cancer Neg Hx    Rectal cancer Neg Hx    Stomach cancer Neg Hx    Breast cancer Neg Hx    Endometrial cancer Neg Hx    Pancreatic cancer Neg Hx    Prostate cancer Neg Hx    Inflammatory bowel disease Neg Hx    Liver disease Neg Hx     Review of Systems  Constitutional:  Negative for appetite change, chills, fatigue, fever and unexpected weight change.  HENT:   Negative  for hearing loss, lump/mass and trouble swallowing.   Eyes:  Negative for eye problems and icterus.  Respiratory:  Negative for chest tightness, cough and shortness of breath.   Cardiovascular:  Negative for chest pain, leg swelling and palpitations.  Gastrointestinal:  Negative for abdominal distention, abdominal pain, constipation, diarrhea, nausea and vomiting.  Endocrine: Negative for hot  flashes.  Genitourinary:  Negative for difficulty urinating.   Musculoskeletal:  Negative for arthralgias.  Skin:  Negative for itching and rash.  Neurological:  Positive for headaches. Negative for dizziness, extremity weakness and numbness.  Hematological:  Negative for adenopathy. Does not bruise/bleed easily.  Psychiatric/Behavioral:  Negative for depression. The patient is not nervous/anxious.       PHYSICAL EXAMINATION  ECOG PERFORMANCE STATUS: 1 - Symptomatic but completely ambulatory  Vitals:   09/20/22 1020  BP: (!) 162/64  Pulse: 85  Resp: 18  Temp: 97.6 F (36.4 C)  SpO2: 100%    Physical Exam Constitutional:      General: She is not in acute distress.    Appearance: Normal appearance. She is not toxic-appearing.  HENT:     Head: Normocephalic and atraumatic.  Eyes:     General: No scleral icterus. Cardiovascular:     Rate and Rhythm: Normal rate and regular rhythm.     Pulses: Normal pulses.     Heart sounds: Murmur heard.  Pulmonary:     Effort: Pulmonary effort is normal.     Breath sounds: Normal breath sounds.  Chest:     Comments: Status post bilateral mastectomies no sign of local recurrence Abdominal:     General: Abdomen is flat. Bowel sounds are normal. There is no distension.     Palpations: Abdomen is soft.     Tenderness: There is no abdominal tenderness.  Musculoskeletal:        General: No swelling.     Cervical back: Neck supple.  Lymphadenopathy:     Cervical: No cervical adenopathy.  Skin:    General: Skin is warm and dry.     Findings: No  rash.  Neurological:     General: No focal deficit present.     Mental Status: She is alert.  Psychiatric:        Mood and Affect: Mood normal.        Behavior: Behavior normal.     LABORATORY DATA:  CBC    Component Value Date/Time   WBC 5.2 09/20/2022 1008   WBC 5.7 07/09/2022 0930   RBC 4.05 09/20/2022 1008   HGB 12.2 09/20/2022 1008   HGB 13.5 02/27/2019 1152   HCT 38.2 09/20/2022 1008   HCT 39.5 02/27/2019 1152   PLT 152 09/20/2022 1008   PLT 186 02/27/2019 1152   MCV 94.3 09/20/2022 1008   MCV 94 02/27/2019 1152   MCH 30.1 09/20/2022 1008   MCHC 31.9 09/20/2022 1008   RDW 12.6 09/20/2022 1008   RDW 12.2 02/27/2019 1152   LYMPHSABS 1.2 09/20/2022 1008   LYMPHSABS 1.7 02/27/2019 1152   MONOABS 0.5 09/20/2022 1008   EOSABS 0.1 09/20/2022 1008   EOSABS 0.1 02/27/2019 1152   BASOSABS 0.0 09/20/2022 1008   BASOSABS 0.0 02/27/2019 1152    CMP     Component Value Date/Time   NA 140 09/20/2022 1008   NA 136 02/27/2019 1152   K 5.1 09/20/2022 1008   CL 107 09/20/2022 1008   CO2 30 09/20/2022 1008   GLUCOSE 92 09/20/2022 1008   BUN 35 (H) 09/20/2022 1008   BUN 19 02/27/2019 1152   CREATININE 1.47 (H) 09/20/2022 1008   CALCIUM 10.1 09/20/2022 1008   PROT 6.4 (L) 09/20/2022 1008   ALBUMIN 4.0 09/20/2022 1008   AST 14 (L) 09/20/2022 1008   ALT 12 09/20/2022 1008   ALKPHOS 56 09/20/2022 1008   BILITOT 0.4 09/20/2022 1008   GFRNONAA  42 (L) 09/20/2022 1008   GFRAA >60 06/06/2019 0250       ASSESSMENT and THERAPY PLAN:   Malignant neoplasm of lower-inner quadrant of left breast in female, estrogen receptor positive (HCC) Ermagene is a 52 year old BRCA2 positive woman with history of stage Ia ER/PR positive breast cancer diagnosed in August 2022 status post neoadjuvant chemotherapy followed by bilateral mastectomies, antiestrogen therapy with exemestane followed by tamoxifen that began in March 2023.  Ceana has no clinical signs of breast cancer  recurrence.  Will continue on tamoxifen daily.   Her labs are improved today from prior.  I gave her a handout of range of motion exercises that will help with some of the tightness she feels with her mastectomy incisions.  She knows to let us know if her headaches do not improve when she gets glasses again.  If so we can always order brain MRI.  She will return in 6 months for labs and follow-up with Dr. Morey Hummingbird.   All questions were answered. The patient knows to call the clinic with any problems, questions or concerns. We can certainly see the patient much sooner if necessary.  Total encounter time:30 minutes*in face-to-face visit time, chart review, lab review, care coordination, order entry, and documentation of the encounter time.  Wilber Bihari, NP 09/20/22 11:11 AM Medical Oncology and Hematology [facility] [place], [place] [address] Tel. [phone]    Fax. [phone]  *Total Encounter Time as defined by the Centers for Medicare and Medicaid Services includes, in addition to the face-to-face time of a patient visit (documented in the note above) non-face-to-face time: obtaining and reviewing outside history, ordering and reviewing medications, tests or procedures, care coordination (communications with other health care professionals or caregivers) and documentation in the medical record.

## 2022-09-20 NOTE — Assessment & Plan Note (Signed)
Cynthia [name] is a 52 year old BRCA2 positive woman with history of stage Ia ER/PR positive breast cancer diagnosed in August 2022 status post neoadjuvant chemotherapy followed by bilateral mastectomies, antiestrogen therapy with exemestane followed by tamoxifen that began in March 2023.  Cynthia [name] has no clinical signs of breast cancer recurrence.  Will continue on tamoxifen daily.   Her labs are improved today from prior.  I gave her a handout of range of motion exercises that will help with some of the tightness she feels with her mastectomy incisions.  She knows to let us know if her headaches do not improve when she gets glasses again.  If so we can always order brain MRI.  She will return in 6 months for labs and follow-up with Dr. Morey Hummingbird.

## 2022-09-24 ENCOUNTER — Ambulatory Visit: Payer: Medicare HMO | Admitting: Physical Therapy

## 2022-09-24 NOTE — Therapy (Deleted)
OUTPATIENT PHYSICAL THERAPY FEMALE PELVIC TREATMENT   Patient Name: Cynthia [name] MRN: [id number] DOB:1971-01-01, 52 y.o., female Today's Date: 09/20/2022  END OF SESSION:  PT End of Session - 09/20/22 1406     Visit Number 2    Date for PT Re-Evaluation 12/06/22    Authorization Type [employer] medicare    PT Start Time 1406    PT Stop Time 1444    PT Time Calculation (min) 38 min              Past Medical History:  Diagnosis Date   Allergy    pollen   Anemia    Anxiety    BRCA2 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 03/01/2021   Breast cancer [facility])    Cataract    surgical repair bilateral   Complication of anesthesia    not herself for 2 days after last surgery (brest lumpectomy)   Coronary artery disease    Depression    ESRD on hemodialysis ([place])    Home HD 5x per week- not on dialysis now had tramsplant 4/17   Family history of ovarian cancer 02/22/2021   GERD (gastroesophageal reflux disease)    Hearing loss 2017   right ear   History of blood transfusion    transfusion reaction   Hyperlipidemia    Hypertension    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 per pt-dr levy also II; Per patient 08/18/21 she is Type 2   Kidney transplant recipient    Leukocytosis 06/13/2021   Neuromuscular disorder ([place])    NEUROPATHY   Sleep apnea    Past Surgical History:  Procedure Laterality Date   ABDOMINAL HYSTERECTOMY     BREAST EXCISIONAL BIOPSY Right 08/2015   BREAST LUMPECTOMY WITH RADIOACTIVE SEED LOCALIZATION Right 09/14/2015   Procedure: RIGHT BREAST LUMPECTOMY WITH RADIOACTIVE SEED LOCALIZATION;  Surgeon: Donnie Mesa, MD;  Location: [place];  Service: General;  Laterality: Right;   BREAST LUMPECTOMY WITH RADIOACTIVE SEED LOCALIZATION Left 03/02/2021   Procedure: LEFT BREAST SEED LUMPECTOMY LEFT SENTINEL LYMPH NODE [place];  Surgeon: Erroll Luna, MD;  Location: [place];   Service: General;  Laterality: Left;  GEN AND PEC BLOCK   BREAST SURGERY Bilateral    biopsy bilateral   CARDIAC CATHETERIZATION     CATARACT EXTRACTION Bilateral    bilateral   CHOLECYSTECTOMY     CYST REMOVAL NECK     DIALYSIS FISTULA CREATION Left    EYE SURGERY Bilateral    lazer   kidney transplant     LEFT HEART CATH AND CORONARY ANGIOGRAPHY N/A 03/04/2019   Procedure: LEFT HEART CATH AND CORONARY ANGIOGRAPHY;  Surgeon: Martinique, Peter M, MD;  Location: [place] CV LAB;  Service: Cardiovascular;  Laterality: N/A;   LEFT HEART CATHETERIZATION WITH CORONARY ANGIOGRAM N/A 03/30/2014   Procedure: LEFT HEART CATHETERIZATION WITH CORONARY ANGIOGRAM;  Surgeon: Sinclair Grooms, MD;  Location: [facility] CATH LAB;  Service: Cardiovascular;  Laterality: N/A;   LIPOMA EXCISION N/A 02/06/2017   Procedure: EXCISION POSTERIOR NECK SEBACEOUS CYST;  Surgeon: Coralie Keens, MD;  Location: [place];  Service: General;  Laterality: N/A;   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   Procedure: REPAIR OF ARTERIOVENOUS FISTULA ANEURYSM;  Surgeon: Serafina Mitchell, MD;  Location: [place];  Service: Vascular;  Laterality: Left;   REVISON OF ARTERIOVENOUS FISTULA Left 09/28/2013   Procedure: EXCISE ESCHAR LEFT ARM  ARTERIOVENOUS FISTULA WITH PLICATION  OF LEFT ARM ARTERIOVENOUS FISTULA;  Surgeon: Elam Dutch, MD;  Location[facility] OR;  Service: Vascular;  Laterality: Left;   REVISON OF ARTERIOVENOUS FISTULA Left 08/26/2015   Procedure: RESECTION ANEURYSM OF LEFT ARM ARTERIOVENOUS FISTULA  ;  Surgeon: Serafina Mitchell, MD;  Location: [place];  Service: Vascular;  Laterality: Left;   ROBOTIC ASSISTED BILATERAL SALPINGO OOPHERECTOMY N/A 07/11/2022   Procedure: XI ROBOTIC ASSISTED BILATERAL SALPINGO OOPHORECTOMY;  Surgeon: Lafonda Mosses, MD;  Location: [facility];  Service: Gynecology;  Laterality: N/A;   SIMPLE MASTECTOMY WITH AXILLARY SENTINEL NODE BIOPSY Bilateral 08/22/2021   Procedure: BILATERAL  SIMPLE MASTECTOMY;  Surgeon: Erroll Luna, MD;  Location: [place];  Service: General;  Laterality: Bilateral;   TUBAL LIGATION     Patient Active Problem List   Diagnosis Date Noted   Gastroesophageal reflux disease without esophagitis 07/28/2022   AKI (acute kidney injury/renal transplant on immunosuppresants  08/24/2021   BRCA2 gene mutation positive s/p bilateral masectomy  08/22/2021   PICC (peripherally inserted central catheter) in place 06/13/2021   Stage 3a chronic kidney disease (CKD) 05/28/2021   Thrombocytopenia 05/28/2021   Aortic atherosclerosis 03/07/2021   Hypercholesterolemia 03/07/2021   Family history of ovarian cancer 02/22/2021   Malignant neoplasm of lower-inner quadrant of left breast in female, estrogen receptor positive 02/15/2021   Foot pain, bilateral 07/28/2020   Insulin dependent type 2 diabetes mellitus 06/06/2019   DKA (diabetic ketoacidoses) 06/02/2019   HTN (hypertension) 03/04/2019   Abnormal nuclear stress test 03/04/2019   Diabetic nephropathy 03/03/2019   History of renal transplant 11/18/2015   Diabetic retinopathy associated with type 2 diabetes mellitus 04/13/2015   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   Pseudoaneurysm of arteriovenous dialysis fistula 03/16/2014   Chronic constipation 07/28/2013   Paresthesias 07/28/2013   Diabetes mellitus type I    Kidney transplant status 05/28/2013   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/11/2013   Precordial chest pain 02/10/2013   Awaiting organ transplant 09/19/2012    PCP: Arthur Holms   REFERRING PROVIDER: Ardis Hughs, MD   REFERRING DIAG:  N39.41 (ICD-10-CM) - Urge incontinence  R39.15 (ICD-10-CM) - Urgency of urination    THERAPY DIAG:  No diagnosis found.  Rationale for Evaluation and Treatment: Rehabilitation  ONSET DATE: 1 year  SUBJECTIVE:                                                                                                                                                                                            SUBJECTIVE STATEMENT: Pt has been having a lot of urgency to get to the bathroom for the last year. Leakage happens after going to the  bathroom and a little leakage after voiding.  Pt wears 2 pads/day Pt is having the same and a lot of pain in back and legs  Fluid intake: Yes: 3-4 bottles    PAIN:  Are you having pain? Yes NPRS scale: 8/10 Pain location: low back  Pain type: radiating Pain description: intermittent   Aggravating factors: doing chores, sometimes getting up out of bed it is sharp Relieving factors: sometimes rest or medicine  PRECAUTIONS: None  WEIGHT BEARING RESTRICTIONS: No  FALLS:  Has patient fallen in last 6 months? No  LIVING ENVIRONMENT: Lives with:  partner, daughter, granddaughter Lives in: House/apartment   OCCUPATION: no  PLOF: Independent  PATIENT GOALS: not have leakage and have more time to get to bathroom  PERTINENT HISTORY:  Abdominal hysterectomy, breast cancer 202 Sexual abuse: No  BOWEL MOVEMENT: Pain with bowel movement: Yes Type of bowel movement:Type (Bristol Stool Scale) hard, Frequency every day, and Strain Yes Fully empty rectum: No Leakage: No Pads: No Fiber supplement:   URINATION: Pain with urination: No Fully empty bladder: No leaks after going Stream: Strong Urgency: Yes: but can hold it Frequency: 3 hours Leakage: Urge to void, Walking to the bathroom, Coughing, and Sneezing Pads: Yes: 2/day  INTERCOURSE: Pain with intercourse: not sexually active d/t erectile disfunction   PREGNANCY: Vaginal deliveries 3   PROLAPSE: None   OBJECTIVE:   DIAGNOSTIC FINDINGS:    PATIENT SURVEYS:    PFIQ-7 = 33  COGNITION: Overall cognitive status: Within functional limits for tasks assessed     SENSATION: Light touch: Appears intact Proprioception: Appears intact  MUSCLE LENGTH: Hamstrings: Right 60 deg; Left 70 deg Thomas test:   LUMBAR SPECIAL TESTS:  Straight leg raise  test: Positive Rt hip and low back with Rt PSLR  FUNCTIONAL TESTS:  Single leg stand unsteady bil and pain on right side with <2 sec hold  GAIT:  Comments: short step length and slow cadence  POSTURE: weight shift right  PELVIC ALIGNMENT:   LUMBARAROM/PROM:  A/PROM A/PROM  eval  Flexion 75% +pain coming back to standing  Extension   Right lateral flexion 90%  Left lateral flexion 75%+pain  Right rotation Pain on Rt  Left rotation WFL    (Blank rows = not tested)  LOWER EXTREMITY ROM:  Passive ROM Right eval Left eval  Hip flexion 75% 90%  Hip extension    Hip abduction    Hip adduction    Hip internal rotation 80% 90%  Hip external rotation 75% 80%  Knee flexion    Knee extension    Ankle dorsiflexion    Ankle plantarflexion    Ankle inversion    Ankle eversion     (Blank rows = not tested)  LOWER EXTREMITY MMT:  MMT Right eval Left eval  Hip flexion    Hip extension    Hip abduction 4/5 4/5  Hip adduction 4/5 4/5  Hip internal rotation    Hip external rotation    Knee flexion    Knee extension    Ankle dorsiflexion    Ankle plantarflexion    Ankle inversion    Ankle eversion     PALPATION:   General  lumbar and gluteals tight; Rt SI and gluteals tender to palpation, hamstirngs and adductors tight Rt>Lt                External Perineal Exam can do kegel with gross assessment outside clothing  Internal Pelvic Floor   Patient confirms identification and approves PT to assess internal pelvic floor and treatment Yes  PELVIC MMT:   MMT eval  Vaginal 3/5 x 5 reps; decreased hold after 4 sec but not able to fully relax after hold  Internal Anal Sphincter   External Anal Sphincter   Puborectalis   Diastasis Recti   (Blank rows = not tested)        TONE: na  PROLAPSE: na  TODAY'S TREATMENT:                                                                                                                               DATE: 09/20/22  Internal assess and stretch to levators and obturator Rt More than Lt - can feel stool in rectum Lumbar soft tissue release and cupping Trigger Point Dry-Needling  Treatment instructions: Expect mild to moderate muscle soreness. S/S of pneumothorax if dry needled over a lung field, and to seek immediate medical attention should they occur. Patient verbalized understanding of these instructions and education.  Patient Consent Given: Yes Education handout provided: Yes Muscles treated: lumbar multifidi Electrical stimulation performed: No Parameters: N/A Treatment response/outcome: increased soft tissue length  Exercises:  Knee to chest Hip rotation Belly breathing  PATIENT EDUCATION:  Education details: toileting techniques, deep breathing, Access Code: [email or web address] Person educated: Patient Education method: Explanation Education comprehension: verbalized understanding and needs further education  HOME EXERCISE PROGRAM: Access Code: [email or web address] URL: [email or web address] Date: 09/22/2022 Prepared by: Jari Favre  Exercises - Supine Hip Internal and External Rotation  - 1 x daily - 7 x weekly - 1 sets - 10 reps - 5 sec hold - Hooklying Single Knee to Chest Stretch  - 1 x daily - 7 x weekly - 1 sets - 5 reps - 20 sec hold  Patient Education - Trigger Point Dry Needling   ASSESSMENT:  CLINICAL IMPRESSION: Patient was given internal pelvic assessment and demonstrates high tone and posterior wall is hard and bulging due to constipation.  Based on these findings, pt given soft tissue and treatment to release muscle tension and stretches with breathing to improve muscle lengthening.  Pt will benefit from skilled PT to continue to address improved bowel and bladder function.  OBJECTIVE IMPAIRMENTS: decreased activity tolerance, decreased coordination, decreased endurance, decreased ROM, decreased strength, increased muscle spasms, impaired  flexibility, impaired tone, postural dysfunction, and pain.   ACTIVITY LIMITATIONS: continence and toileting  PARTICIPATION LIMITATIONS: cleaning, interpersonal relationship, and community activity  PERSONAL FACTORS: Age and 3+ comorbidities: Abdominal hysterectomy, breast cancer 2022, 3 vaginal deliveries  are also affecting patient's functional outcome.   REHAB POTENTIAL: Excellent  CLINICAL DECISION MAKING: Evolving/moderate complexity  EVALUATION COMPLEXITY: Moderate   GOALS: Goals reviewed with patient? Yes  SHORT TERM GOALS: Target date: 10/08/22  Ind with how to correctly do a kegel and able to relax and bulge Baseline: Goal status: INITIAL  2.  Ind with healthy bladder and  toileting habits Baseline:  Goal status: INITIAL   LONG TERM GOALS: Target date: 12/06/22  Pt will be independent with advanced HEP to maintain improvements made throughout therapy  Baseline:  Goal status: INITIAL  2.  Pt will report 80% reduction of pain due to improvements in posture, strength, and muscle length  Baseline:  Goal status: INITIAL  3.  Pt will have 80% reduced leakage during a typical day  Baseline:  Goal status: INITIAL  4.  Pt will have to use 1 liner or pads per day  Baseline:  Goal status: INITIAL  5.  Pt will be able to void without leakage afterwards due to improved pelvic floor coordination  Baseline:  Goal status: INITIAL    PLAN:  PT FREQUENCY: 1-2x/week  PT DURATION: 12 weeks  PLANNED INTERVENTIONS: Therapeutic exercises, Therapeutic activity, Neuromuscular re-education, Balance training, Gait training, Patient/Family education, Self Care, Joint mobilization, Dry Needling, Electrical stimulation, Cryotherapy, Moist heat, Taping, Traction, Biofeedback, Manual therapy, and Re-evaluation  PLAN FOR NEXT SESSION: f/u on dry needling and bowel movements, stretching and ROM with breathing, abdominal fascial release, internal STM as needed   [employer],  PT 09/24/2022, 2:01 PM

## 2022-10-04 ENCOUNTER — Ambulatory Visit: Payer: Medicare HMO | Attending: Urology | Admitting: Physical Therapy

## 2022-10-04 ENCOUNTER — Encounter: Payer: Self-pay | Admitting: Physical Therapy

## 2022-10-04 DIAGNOSIS — M6281 Muscle weakness (generalized): Secondary | ICD-10-CM | POA: Insufficient documentation

## 2022-10-04 DIAGNOSIS — M62838 Other muscle spasm: Secondary | ICD-10-CM | POA: Diagnosis present

## 2022-10-04 DIAGNOSIS — R293 Abnormal posture: Secondary | ICD-10-CM | POA: Diagnosis present

## 2022-10-04 DIAGNOSIS — R279 Unspecified lack of coordination: Secondary | ICD-10-CM | POA: Insufficient documentation

## 2022-10-04 NOTE — Therapy (Signed)
OUTPATIENT PHYSICAL THERAPY FEMALE PELVIC TREATMENT   Patient Name: Cynthia [name] MRN: [id number] DOB:1971-05-10, 52 y.o., female Today's Date: 10/04/22   END OF SESSION:   PT End of Session - 10/04/22 1736     Visit Number 3    Date for PT Re-Evaluation 12/06/22    Authorization Type [employer] medicare    Authorization - Visit Number 3    Authorization - Number of Visits 8    PT Start Time 1450    PT Stop Time 1532    PT Time Calculation (min) 42 min    Activity Tolerance Patient tolerated treatment well    Behavior During Therapy WFL for tasks assessed/performed              Past Medical History:  Diagnosis Date   Allergy    pollen   Anemia    Anxiety    BRCA2 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 03/01/2021   Breast cancer    Cataract    surgical repair bilateral   Complication of anesthesia    not herself for 2 days after last surgery (brest lumpectomy)   Coronary artery disease    Depression    ESRD on hemodialysis    Home HD 5x per week- not on dialysis now had tramsplant 4/17   Family history of ovarian cancer 02/22/2021   GERD (gastroesophageal reflux disease)    Hearing loss 2017   right ear   History of blood transfusion    transfusion reaction   Hyperlipidemia    Hypertension    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 per pt-dr levy also II; Per patient 08/18/21 she is Type 2   Kidney transplant recipient    Leukocytosis 06/13/2021   Neuromuscular disorder    NEUROPATHY   Sleep apnea    Past Surgical History:  Procedure Laterality Date   ABDOMINAL HYSTERECTOMY     BREAST EXCISIONAL BIOPSY Right 08/2015   BREAST LUMPECTOMY WITH RADIOACTIVE SEED LOCALIZATION Right 09/14/2015   Procedure: RIGHT BREAST LUMPECTOMY WITH RADIOACTIVE SEED LOCALIZATION;  Surgeon: Manus Rudd, MD;  Location: [facility];  Service: General;  Laterality: Right;   BREAST LUMPECTOMY WITH RADIOACTIVE SEED  LOCALIZATION Left 03/02/2021   Procedure: LEFT BREAST SEED LUMPECTOMY LEFT SENTINEL LYMPH NODE MAPPING;  Surgeon: Harriette Bouillon, MD;  Location: [facility];  Service: General;  Laterality: Left;  GEN AND PEC BLOCK   BREAST SURGERY Bilateral    biopsy bilateral   CARDIAC CATHETERIZATION     CATARACT EXTRACTION Bilateral    bilateral   CHOLECYSTECTOMY     CYST REMOVAL NECK     DIALYSIS FISTULA CREATION Left    EYE SURGERY Bilateral    lazer   kidney transplant     LEFT HEART CATH AND CORONARY ANGIOGRAPHY N/A 03/04/2019   Procedure: LEFT HEART CATH AND CORONARY ANGIOGRAPHY;  Surgeon: Swaziland, Peter M, MD;  Location: [facility];  Service: Cardiovascular;  Laterality: N/A;   LEFT HEART CATHETERIZATION WITH CORONARY ANGIOGRAM N/A 03/30/2014   Procedure: LEFT HEART CATHETERIZATION WITH CORONARY ANGIOGRAM;  Surgeon: Lesleigh Noe, MD;  Location: [facility] CATH LAB;  Service: Cardiovascular;  Laterality: N/A;   LIPOMA EXCISION N/A 02/06/2017   Procedure: EXCISION POSTERIOR NECK SEBACEOUS CYST;  Surgeon: Abigail Miyamoto, MD;  Location: [facility] OR;  Service: General;  Laterality: N/A;   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   Procedure: REPAIR OF ARTERIOVENOUS FISTULA ANEURYSM;  Surgeon: Nada Libman, MD;  Location: [facility] OR;  Service: Vascular;  Laterality: Left;   REVISON OF ARTERIOVENOUS FISTULA Left 09/28/2013   Procedure: EXCISE ESCHAR LEFT ARM  ARTERIOVENOUS FISTULA WITH PLICATION OF LEFT ARM ARTERIOVENOUS FISTULA;  Surgeon: Sherren Kerns, MD;  Location: [facility] OR;  Service: Vascular;  Laterality: Left;   REVISON OF ARTERIOVENOUS FISTULA Left 08/26/2015   Procedure: RESECTION ANEURYSM OF LEFT ARM ARTERIOVENOUS FISTULA  ;  Surgeon: Nada Libman, MD;  Location: [facility] OR;  Service: Vascular;  Laterality: Left;   ROBOTIC ASSISTED BILATERAL SALPINGO OOPHERECTOMY N/A 07/11/2022   Procedure: XI ROBOTIC ASSISTED BILATERAL SALPINGO OOPHORECTOMY;  Surgeon: Carver Fila, MD;  Location: [facility];  Service: Gynecology;  Laterality: N/A;   SIMPLE MASTECTOMY WITH AXILLARY SENTINEL NODE BIOPSY Bilateral 08/22/2021   Procedure: BILATERAL SIMPLE MASTECTOMY;  Surgeon: Harriette Bouillon, MD;  Location: [facility] OR;  Service: General;  Laterality: Bilateral;   TUBAL LIGATION     Patient Active Problem List   Diagnosis Date Noted   Gastroesophageal reflux disease without esophagitis 07/28/2022   AKI (acute kidney injury/renal transplant on immunosuppresants  08/24/2021   BRCA2 gene mutation positive s/p bilateral masectomy  08/22/2021   PICC (peripherally inserted central catheter) in place 06/13/2021   Stage 3a chronic kidney disease (CKD) 05/28/2021   Thrombocytopenia 05/28/2021   Aortic atherosclerosis 03/07/2021   Hypercholesterolemia 03/07/2021   Family history of ovarian cancer 02/22/2021   Malignant neoplasm of lower-inner quadrant of left breast in female, estrogen receptor positive 02/15/2021   Foot pain, bilateral 07/28/2020   Insulin dependent type 2 diabetes mellitus 06/06/2019   DKA (diabetic ketoacidoses) 06/02/2019   HTN (hypertension) 03/04/2019   Abnormal nuclear stress test 03/04/2019   Diabetic nephropathy 03/03/2019   History of renal transplant 11/18/2015   Diabetic retinopathy associated with type 2 diabetes mellitus 04/13/2015   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   Pseudoaneurysm of arteriovenous dialysis fistula 03/16/2014   Chronic constipation 07/28/2013   Paresthesias 07/28/2013   Diabetes mellitus type I    Kidney transplant status 05/28/2013   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/11/2013   Precordial chest pain 02/10/2013   Awaiting organ transplant 09/19/2012    PCP: Loura Back   REFERRING PROVIDER: Crist Fat, MD   REFERRING DIAG:  N39.41 (ICD-10-CM) - Urge incontinence  R39.15 (ICD-10-CM) - Urgency of urination    THERAPY DIAG:  Abnormal posture  Muscle weakness (generalized)  Other muscle spasm  Unspecified lack of  coordination  Rationale for Evaluation and Treatment: Rehabilitation  ONSET DATE: 1 year  SUBJECTIVE:  SUBJECTIVE STATEMENT: Pt had less pain.  Bowel and bladder are the same. Fluid intake: Yes: 3-4 bottles    PAIN:  Are you having pain? Yes NPRS scale: 4/10 Pain location: low back  Pain type: radiating Pain description: intermittent   Aggravating factors: doing chores, sometimes getting up out of bed it is sharp Relieving factors: sometimes rest or medicine  PRECAUTIONS: None  WEIGHT BEARING RESTRICTIONS: No  FALLS:  Has patient fallen in last 6 months? No  LIVING ENVIRONMENT: Lives with:  partner, daughter, granddaughter Lives in: House/apartment   OCCUPATION: no  PLOF: Independent  PATIENT GOALS: not have leakage and have more time to get to bathroom  PERTINENT HISTORY:  Abdominal hysterectomy, breast cancer 202 Sexual abuse: No  BOWEL MOVEMENT: Pain with bowel movement: Yes Type of bowel movement:Type (Bristol Stool Scale) hard, Frequency every day, and Strain Yes Fully empty rectum: No Leakage: No Pads: No Fiber supplement:   URINATION: Pain with urination: No Fully empty bladder: No leaks after going Stream: Strong Urgency: Yes: but can hold it Frequency: 3 hours Leakage: Urge to void, Walking to the bathroom, Coughing, and Sneezing Pads: Yes: 2/day  INTERCOURSE: Pain with intercourse: not sexually active d/t erectile disfunction   PREGNANCY: Vaginal deliveries 3   PROLAPSE: None   OBJECTIVE:   DIAGNOSTIC FINDINGS:    PATIENT SURVEYS:    PFIQ-7 = 33  COGNITION: Overall cognitive status: Within functional limits for tasks assessed     SENSATION: Light touch: Appears intact Proprioception: Appears intact  MUSCLE LENGTH: Hamstrings: Right 60  deg; Left 70 deg Thomas test:   LUMBAR SPECIAL TESTS:  Straight leg raise test: Positive Rt hip and low back with Rt PSLR  FUNCTIONAL TESTS:  Single leg stand unsteady bil and pain on right side with <2 sec hold  GAIT:  Comments: short step length and slow cadence  POSTURE: weight shift right  PELVIC ALIGNMENT:   LUMBARAROM/PROM:  A/PROM A/PROM  eval  Flexion 75% +pain coming back to standing  Extension   Right lateral flexion 90%  Left lateral flexion 75%+pain  Right rotation Pain on Rt  Left rotation WFL    (Blank rows = not tested)  LOWER EXTREMITY ROM:  Passive ROM Right eval Left eval  Hip flexion 75% 90%  Hip extension    Hip abduction    Hip adduction    Hip internal rotation 80% 90%  Hip external rotation 75% 80%  Knee flexion    Knee extension    Ankle dorsiflexion    Ankle plantarflexion    Ankle inversion    Ankle eversion     (Blank rows = not tested)  LOWER EXTREMITY MMT:  MMT Right eval Left eval  Hip flexion    Hip extension    Hip abduction 4/5 4/5  Hip adduction 4/5 4/5  Hip internal rotation    Hip external rotation    Knee flexion    Knee extension    Ankle dorsiflexion    Ankle plantarflexion    Ankle inversion    Ankle eversion     PALPATION:   General  lumbar and gluteals tight; Rt SI and gluteals tender to palpation, hamstirngs and adductors tight Rt>Lt                External Perineal Exam can do kegel with gross assessment outside clothing  Internal Pelvic Floor   Patient confirms identification and approves PT to assess internal pelvic floor and treatment Yes  PELVIC MMT:   MMT eval  Vaginal 3/5 x 5 reps; decreased hold after 4 sec but not able to fully relax after hold  Internal Anal Sphincter   External Anal Sphincter   Puborectalis   Diastasis Recti   (Blank rows = not tested)        TONE: na  PROLAPSE: na  TODAY'S TREATMENT:                                                                                                                               DATE: 10/04/22  Abdominal fascial release - guarded/ticklish Lumbar soft tissue release and cupping Trigger Point Dry-Needling  Treatment instructions: Expect mild to moderate muscle soreness. S/S of pneumothorax if dry needled over a lung field, and to seek immediate medical attention should they occur. Patient verbalized understanding of these instructions and education.  Patient Consent Given: Yes Education handout provided: Yes Muscles treated: lumbar multifidi Electrical stimulation performed: No Parameters: N/A Treatment response/outcome: increased soft tissue length  NMRE:  Supine transversus abdominus activation: Exhale with exertion head slightly lifted VC/TC to keep ribcage pressed into mat Pressing on thighs Ball squeeze Seated pelvic tilt and engage core  PATIENT EDUCATION:  Education details: toileting techniques, deep breathing, Access Code: [email or web address] Person educated: Patient Education method: Explanation Education comprehension: verbalized understanding and needs further education  HOME EXERCISE PROGRAM: Access Code: [email or web address]    ASSESSMENT:  CLINICAL IMPRESSION: Patient was given progression of exercises and able to do core strengthening.  Overall, pt having less pain.  Pt did well with soft tissue and not as much pain after today's session and she was moving on mat more easily.  Very hard to get through guarded tissue on abdomen but did release after holding for several minutes  Pt will benefit from skilled PT to continue to address improved bowel and bladder function.  OBJECTIVE IMPAIRMENTS: decreased activity tolerance, decreased coordination, decreased endurance, decreased ROM, decreased strength, increased muscle spasms, impaired flexibility, impaired tone, postural dysfunction, and pain.   ACTIVITY LIMITATIONS: continence and toileting  PARTICIPATION LIMITATIONS: cleaning,  interpersonal relationship, and community activity  PERSONAL FACTORS: Age and 3+ comorbidities: Abdominal hysterectomy, breast cancer 2022, 3 vaginal deliveries  are also affecting patient's functional outcome.   REHAB POTENTIAL: Excellent  CLINICAL DECISION MAKING: Evolving/moderate complexity  EVALUATION COMPLEXITY: Moderate   GOALS: Goals reviewed with patient? Yes  SHORT TERM GOALS: Target date: 10/08/22  Ind with how to correctly do a kegel and able to relax and bulge Baseline: Goal status: IN PROGRESS  2.  Ind with healthy bladder and toileting habits Baseline:  Goal status: IN PROGRESS   LONG TERM GOALS: Target date: 12/06/22  Pt will be independent with advanced HEP to maintain improvements made throughout therapy  Baseline:  Goal status: INITIAL  2.  Pt will report 80% reduction  of pain due to improvements in posture, strength, and muscle length  Baseline:  Goal status: INITIAL  3.  Pt will have 80% reduced leakage during a typical day  Baseline:  Goal status: INITIAL  4.  Pt will have to use 1 liner or pads per day  Baseline:  Goal status: INITIAL  5.  Pt will be able to void without leakage afterwards due to improved pelvic floor coordination  Baseline:  Goal status: INITIAL    PLAN:  PT FREQUENCY: 1-2x/week  PT DURATION: 12 weeks  PLANNED INTERVENTIONS: Therapeutic exercises, Therapeutic activity, Neuromuscular re-education, Balance training, Gait training, Patient/Family education, Self Care, Joint mobilization, Dry Needling, Electrical stimulation, Cryotherapy, Moist heat, Taping, Traction, Biofeedback, Manual therapy, and Re-evaluation  PLAN FOR NEXT SESSION: f/u on dry needling #2 and bowel movements, stretching and ROM with breathing, abdominal fascial release, internal STM as needed, progress core strength   Junious Silk, PT 10/04/2022, 5:38 PM

## 2022-10-15 ENCOUNTER — Ambulatory Visit: Payer: Medicare HMO | Admitting: Physical Therapy

## 2022-10-15 DIAGNOSIS — R293 Abnormal posture: Secondary | ICD-10-CM | POA: Diagnosis not present

## 2022-10-15 DIAGNOSIS — R279 Unspecified lack of coordination: Secondary | ICD-10-CM

## 2022-10-15 DIAGNOSIS — M6281 Muscle weakness (generalized): Secondary | ICD-10-CM

## 2022-10-15 DIAGNOSIS — M62838 Other muscle spasm: Secondary | ICD-10-CM

## 2022-10-15 NOTE — Therapy (Signed)
OUTPATIENT PHYSICAL THERAPY FEMALE PELVIC TREATMENT   Patient Name: Cynthia [name] MRN: [id number] DOB:1970-07-10, 52 y.o., female Today's Date: 10/15/22   END OF SESSION:   PT End of Session - 10/15/22 1522     Visit Number 4    Number of Visits 8    Date for PT Re-Evaluation 12/06/22    Authorization Type [employer] medicare - approved 8 until 6/13    PT Start Time 1403    PT Stop Time 1444    PT Time Calculation (min) 41 min    Activity Tolerance Patient tolerated treatment well    Behavior During Therapy [facility] for tasks assessed/performed               Past Medical History:  Diagnosis Date   Allergy    pollen   Anemia    Anxiety    BRCA2 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 03/01/2021   Breast cancer    Cataract    surgical repair bilateral   Complication of anesthesia    not herself for 2 days after last surgery (brest lumpectomy)   Coronary artery disease    Depression    ESRD on hemodialysis    Home HD 5x per week- not on dialysis now had tramsplant 4/17   Family history of ovarian cancer 02/22/2021   GERD (gastroesophageal reflux disease)    Hearing loss 2017   right ear   History of blood transfusion    transfusion reaction   Hyperlipidemia    Hypertension    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 per pt-dr levy also II; Per patient 08/18/21 she is Type 2   Kidney transplant recipient    Leukocytosis 06/13/2021   Neuromuscular disorder    NEUROPATHY   Sleep apnea    Past Surgical History:  Procedure Laterality Date   ABDOMINAL HYSTERECTOMY     BREAST EXCISIONAL BIOPSY Right 08/2015   BREAST LUMPECTOMY WITH RADIOACTIVE SEED LOCALIZATION Right 09/14/2015   Procedure: RIGHT BREAST LUMPECTOMY WITH RADIOACTIVE SEED LOCALIZATION;  Surgeon: Manus Rudd, MD;  Location: [facility];  Service: General;  Laterality: Right;   BREAST LUMPECTOMY WITH RADIOACTIVE SEED LOCALIZATION Left 03/02/2021    Procedure: LEFT BREAST SEED LUMPECTOMY LEFT SENTINEL LYMPH NODE MAPPING;  Surgeon: Harriette Bouillon, MD;  Location: [facility];  Service: General;  Laterality: Left;  GEN AND PEC BLOCK   BREAST SURGERY Bilateral    biopsy bilateral   CARDIAC CATHETERIZATION     CATARACT EXTRACTION Bilateral    bilateral   CHOLECYSTECTOMY     CYST REMOVAL NECK     DIALYSIS FISTULA CREATION Left    EYE SURGERY Bilateral    lazer   kidney transplant     LEFT HEART CATH AND CORONARY ANGIOGRAPHY N/A 03/04/2019   Procedure: LEFT HEART CATH AND CORONARY ANGIOGRAPHY;  Surgeon: Swaziland, Peter M, MD;  Location: [facility];  Service: Cardiovascular;  Laterality: N/A;   LEFT HEART CATHETERIZATION WITH CORONARY ANGIOGRAM N/A 03/30/2014   Procedure: LEFT HEART CATHETERIZATION WITH CORONARY ANGIOGRAM;  Surgeon: Lesleigh Noe, MD;  Location: [facility] CATH LAB;  Service: Cardiovascular;  Laterality: N/A;   LIPOMA EXCISION N/A 02/06/2017   Procedure: EXCISION POSTERIOR NECK SEBACEOUS CYST;  Surgeon: Abigail Miyamoto, MD;  Location: [facility] OR;  Service: General;  Laterality: N/A;   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   Procedure: REPAIR OF ARTERIOVENOUS FISTULA ANEURYSM;  Surgeon: Nada Libman,  MD;  Location: [facility] OR;  Service: Vascular;  Laterality: Left;   REVISON OF ARTERIOVENOUS FISTULA Left 09/28/2013   Procedure: EXCISE ESCHAR LEFT ARM  ARTERIOVENOUS FISTULA WITH PLICATION OF LEFT ARM ARTERIOVENOUS FISTULA;  Surgeon: Sherren Kerns, MD;  Location: [facility] OR;  Service: Vascular;  Laterality: Left;   REVISON OF ARTERIOVENOUS FISTULA Left 08/26/2015   Procedure: RESECTION ANEURYSM OF LEFT ARM ARTERIOVENOUS FISTULA  ;  Surgeon: Nada Libman, MD;  Location: [facility] OR;  Service: Vascular;  Laterality: Left;   ROBOTIC ASSISTED BILATERAL SALPINGO OOPHERECTOMY N/A 07/11/2022   Procedure: XI ROBOTIC ASSISTED BILATERAL SALPINGO OOPHORECTOMY;  Surgeon: Carver Fila, MD;  Location: [facility]  ORS;  Service: Gynecology;  Laterality: N/A;   SIMPLE MASTECTOMY WITH AXILLARY SENTINEL NODE BIOPSY Bilateral 08/22/2021   Procedure: BILATERAL SIMPLE MASTECTOMY;  Surgeon: Harriette Bouillon, MD;  Location: [facility] OR;  Service: General;  Laterality: Bilateral;   TUBAL LIGATION     Patient Active Problem List   Diagnosis Date Noted   Gastroesophageal reflux disease without esophagitis 07/28/2022   AKI (acute kidney injury/renal transplant on immunosuppresants  08/24/2021   BRCA2 gene mutation positive s/p bilateral masectomy  08/22/2021   PICC (peripherally inserted central catheter) in place 06/13/2021   Stage 3a chronic kidney disease (CKD) 05/28/2021   Thrombocytopenia 05/28/2021   Aortic atherosclerosis 03/07/2021   Hypercholesterolemia 03/07/2021   Family history of ovarian cancer 02/22/2021   Malignant neoplasm of lower-inner quadrant of left breast in female, estrogen receptor positive 02/15/2021   Foot pain, bilateral 07/28/2020   Insulin dependent type 2 diabetes mellitus 06/06/2019   DKA (diabetic ketoacidoses) 06/02/2019   HTN (hypertension) 03/04/2019   Abnormal nuclear stress test 03/04/2019   Diabetic nephropathy 03/03/2019   History of renal transplant 11/18/2015   Diabetic retinopathy associated with type 2 diabetes mellitus 04/13/2015   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   Pseudoaneurysm of arteriovenous dialysis fistula 03/16/2014   Chronic constipation 07/28/2013   Paresthesias 07/28/2013   Diabetes mellitus type I    Kidney transplant status 05/28/2013   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/11/2013   Precordial chest pain 02/10/2013   Awaiting organ transplant 09/19/2012    PCP: Loura Back   REFERRING PROVIDER: Crist Fat, MD   REFERRING DIAG:  N39.41 (ICD-10-CM) - Urge incontinence  R39.15 (ICD-10-CM) - Urgency of urination    THERAPY DIAG:  Abnormal posture  Muscle weakness (generalized)  Other muscle spasm  Unspecified lack of coordination  Rationale for  Evaluation and Treatment: Rehabilitation  ONSET DATE: 1 year  SUBJECTIVE:  SUBJECTIVE STATEMENT: Pt had less pain, the back is much better not much pain (denies any pain rating).  After last time I was able to have bowel movement for 3 days after but now it is not good again.  My upper back, shoulders  really hurt and that has been since the breast cancer. Fluid intake: Yes: 3-4 bottles    PAIN:  Are you having pain? No   PRECAUTIONS: None  WEIGHT BEARING RESTRICTIONS: No  FALLS:  Has patient fallen in last 6 months? No  LIVING ENVIRONMENT: Lives with:  partner, daughter, granddaughter Lives in: House/apartment   OCCUPATION: no  PLOF: Independent  PATIENT GOALS: not have leakage and have more time to get to bathroom  PERTINENT HISTORY:  Abdominal hysterectomy, breast cancer 202 Sexual abuse: No  BOWEL MOVEMENT: Pain with bowel movement: Yes Type of bowel movement:Type (Bristol Stool Scale) hard, Frequency every day, and Strain Yes Fully empty rectum: No Leakage: No Pads: No Fiber supplement:   URINATION: Pain with urination: No Fully empty bladder: No leaks after going Stream: Strong Urgency: Yes: but can hold it Frequency: 3 hours Leakage: Urge to void, Walking to the bathroom, Coughing, and Sneezing Pads: Yes: 2/day  INTERCOURSE: Pain with intercourse: not sexually active d/t erectile disfunction   PREGNANCY: Vaginal deliveries 3   PROLAPSE: None   OBJECTIVE:   DIAGNOSTIC FINDINGS:    PATIENT SURVEYS:    PFIQ-7 = 33  COGNITION: Overall cognitive status: Within functional limits for tasks assessed     SENSATION: Light touch: Appears intact Proprioception: Appears intact  MUSCLE LENGTH: Hamstrings: Right 60 deg; Left 70 deg Thomas test:   LUMBAR  SPECIAL TESTS:  Straight leg raise test: Positive Rt hip and low back with Rt PSLR  FUNCTIONAL TESTS:  Single leg stand unsteady bil and pain on right side with <2 sec hold  GAIT:  Comments: short step length and slow cadence  POSTURE: weight shift right  PELVIC ALIGNMENT:   LUMBARAROM/PROM:  A/PROM A/PROM  eval  Flexion 75% +pain coming back to standing  Extension   Right lateral flexion 90%  Left lateral flexion 75%+pain  Right rotation Pain on Rt  Left rotation WFL    (Blank rows = not tested)  LOWER EXTREMITY ROM:  Passive ROM Right eval Left eval  Hip flexion 75% 90%  Hip extension    Hip abduction    Hip adduction    Hip internal rotation 80% 90%  Hip external rotation 75% 80%  Knee flexion    Knee extension    Ankle dorsiflexion    Ankle plantarflexion    Ankle inversion    Ankle eversion     (Blank rows = not tested)  LOWER EXTREMITY MMT:  MMT Right eval Left eval  Hip flexion    Hip extension    Hip abduction 4/5 4/5  Hip adduction 4/5 4/5  Hip internal rotation    Hip external rotation    Knee flexion    Knee extension    Ankle dorsiflexion    Ankle plantarflexion    Ankle inversion    Ankle eversion     PALPATION:   General  lumbar and gluteals tight; Rt SI and gluteals tender to palpation, hamstirngs and adductors tight Rt>Lt                External Perineal Exam can do kegel with gross assessment outside clothing  Internal Pelvic Floor   Patient confirms identification and approves PT to assess internal pelvic floor and treatment Yes  PELVIC MMT:   MMT eval  Vaginal 3/5 x 5 reps; decreased hold after 4 sec but not able to fully relax after hold  Internal Anal Sphincter   External Anal Sphincter   Puborectalis   Diastasis Recti   (Blank rows = not tested)        TONE: na  PROLAPSE: na  TODAY'S TREATMENT:                                                                                                                               DATE: 10/15/22  Abdominal fascial release - guarded/ticklish on the left lower quadrant Lumbar and gluteal STM and trigger point Trigger Point Dry-Needling  Treatment instructions: Expect mild to moderate muscle soreness. S/S of pneumothorax if dry needled over a lung field, and to seek immediate medical attention should they occur. Patient verbalized understanding of these instructions and education.  Patient Consent Given: Yes Education handout provided: Yes Muscles treated: lumbar multifidi Electrical stimulation performed: No Parameters: N/A Treatment response/outcome: increased soft tissue length  NMRE:  Supine transversus abdominus activation: Exhale with exertion head slightly lifted VC/TC to keep ribcage pressed into mat and do pelvic tilt posterior to mat Pressing on thighs Ball squeeze Bent knee fall out  Opposite UE to thigh  PATIENT EDUCATION:  Education details: toileting techniques, deep breathing, Access Code: [email or web address] Person educated: Patient Education method: Explanation Education comprehension: verbalized understanding and needs further education  HOME EXERCISE PROGRAM: Access Code: [email or web address]    ASSESSMENT:  CLINICAL IMPRESSION: Patient was given progression of exercises and able to do core strengthening.  Overall, pt having less pain and was able to have better bowel movements since last session.  Pt did well with soft tissue and added soft tissue to trigger points in Rt gluteals.  Guarded tissue on abdomen was much easier to work on today and pt given education on doing abdominal massage on her own.  Pt will benefit from skilled PT to continue to address improved bowel and bladder function.  OBJECTIVE IMPAIRMENTS: decreased activity tolerance, decreased coordination, decreased endurance, decreased ROM, decreased strength, increased muscle spasms, impaired flexibility, impaired tone, postural dysfunction, and pain.    ACTIVITY LIMITATIONS: continence and toileting  PARTICIPATION LIMITATIONS: cleaning, interpersonal relationship, and community activity  PERSONAL FACTORS: Age and 3+ comorbidities: Abdominal hysterectomy, breast cancer 2022, 3 vaginal deliveries  are also affecting patient's functional outcome.   REHAB POTENTIAL: Excellent  CLINICAL DECISION MAKING: Evolving/moderate complexity  EVALUATION COMPLEXITY: Moderate   GOALS: Goals reviewed with patient? Yes  SHORT TERM GOALS: Target date: 10/08/22  Ind with how to correctly do a kegel and able to relax and bulge Baseline: Goal status: MET  2.  Ind with healthy bladder and toileting habits Baseline:  Goal status: MET   LONG TERM GOALS: Target date: 12/06/22  Pt will be  independent with advanced HEP to maintain improvements made throughout therapy  Baseline:  Goal status: INITIAL  2.  Pt will report 80% reduction of pain due to improvements in posture, strength, and muscle length  Baseline:  Goal status: INITIAL  3.  Pt will have 80% reduced leakage during a typical day  Baseline:  Goal status: INITIAL  4.  Pt will have to use 1 liner or pads per day  Baseline:  Goal status: INITIAL  5.  Pt will be able to void without leakage afterwards due to improved pelvic floor coordination  Baseline:  Goal status: INITIAL    PLAN:  PT FREQUENCY: 1-2x/week  PT DURATION: 12 weeks  PLANNED INTERVENTIONS: Therapeutic exercises, Therapeutic activity, Neuromuscular re-education, Balance training, Gait training, Patient/Family education, Self Care, Joint mobilization, Dry Needling, Electrical stimulation, Cryotherapy, Moist heat, Taping, Traction, Biofeedback, Manual therapy, and Re-evaluation  PLAN FOR NEXT SESSION: f/u on dry needling #3 and bowel movements, internal STM as needed, progress core strength   Brayton Caves Areg Bialas, PT 10/15/2022, 3:23 PM

## 2022-10-16 NOTE — Progress Notes (Addendum)
[facility] - Clinic Note  10/30/2022     CHIEF COMPLAINT Patient presents for Retina Follow Up   HISTORY OF PRESENT ILLNESS: Cynthia [name] is a 52 y.o. female who presents to the clinic today for:   HPI     Retina Follow Up   Patient presents with  Diabetic Retinopathy.  In both eyes.  This started 9 months ago.  Duration of 9 months.  Since onset it is stable.  I, the attending physician,  performed the HPI with the patient and updated documentation appropriately.        Comments   9 month retina follow up PDR pt is reporting vision seems ok her last blood reading 67 last A1C 6.4      Last edited by Rennis Chris, MD on 11/03/2022  2:19 AM.     Pt saw Dr. Fabian Sharp on 04.17.24 for lost glasses, pt states she sometimes sees floaters that look like "snakes" in her vision  Referring physician: Olivia Canter, Md [address] [place],  [place] [address]  HISTORICAL INFORMATION:   Selected notes from the MEDICAL RECORD NUMBER Referred by Dr. Fabian Sharp for diabetic retinal eval LEE: 09/07/2021 BCVA OD: 20/30 OS: 20/25 Ocular Hx- PDR, pseudophakia, dry eye PMH- DM, HTN, depression, kidney transplant; last a1c was 7.3 on 08/18/21    CURRENT MEDICATIONS: No current outpatient medications on file. (Ophthalmic Drugs)   No current facility-administered medications for this visit. (Ophthalmic Drugs)   Current Outpatient Medications (Other)  Medication Sig   Accu-Chek Softclix Lancets lancets Use as instructed (Patient taking differently: 1 each by Other route See admin instructions. Use as instructed)   acetaminophen (TYLENOL) 500 MG tablet Take 500 mg by mouth every 6 (six) hours as needed for mild pain, fever or headache.   aspirin EC 81 MG tablet Take 81 mg by mouth daily.   Blood Glucose Monitoring Suppl (ACCU-CHEK AVIVA CONNECT) w/Device KIT Check sugar 1x daily (Patient taking differently: 1 each by Other route See admin instructions.  Check sugar 1x daily)   carvedilol (COREG) 25 MG tablet Take 25 mg by mouth 2 (two) times daily with a meal.   cetirizine (ZYRTEC) 10 MG tablet Take 10 mg by mouth daily as needed for allergies.   cholecalciferol (VITAMIN D3) 25 MCG (1000 UNIT) tablet Take 1,000 Units by mouth daily.   cyanocobalamin 1000 MCG tablet Take 1,000 mcg by mouth daily.   DROPLET PEN NEEDLES 31G X 8 MM MISC    ENVARSUS XR 1 MG TB24 Take 4 mg by mouth daily.   escitalopram (LEXAPRO) 10 MG tablet Take 10 mg by mouth daily.   Fluoxetine HCl, PMDD, 20 MG TABS Take 20 mg by mouth daily.   furosemide (LASIX) 40 MG tablet Take 40 mg by mouth 2 (two) times daily as needed for fluid.   gabapentin (NEURONTIN) 300 MG capsule Take 1 capsule (300 mg total) by mouth 2 (two) times daily. Start at 1 cap once daily, and increase to twice daily if she tolerates. Continue 600mg  at night (Patient taking differently: Take 300-600 mg by mouth See admin instructions. Take 300 mg by mouth in the morning and 600 mg at night)   Garlic 100 MG TABS Take 100 mg by mouth daily.   glucose blood (ACCU-CHEK AVIVA PLUS) test strip Check sugar 1x daily (Patient taking differently: 1 each by Other route See admin instructions. Check sugar 1x daily)   hydrALAZINE (APRESOLINE) 25 MG  tablet Take 25 mg by mouth 2 (two) times daily.   insulin glargine (LANTUS SOLOSTAR) 100 UNIT/ML Solostar Pen Inject 25-30 Units into the skin See admin instructions. Inject 30 units into the skin in the morning and 25 units at bedtime   Insulin Pen Needle (PEN NEEDLES) 30G X 8 MM MISC 1 each by Does not apply route daily. E11.9   magnesium oxide (MAG-OX) 400 MG tablet Take 400 mg by mouth 2 (two) times daily.   multivitamin-lutein (OCUVITE-LUTEIN) CAPS capsule Take 1 capsule by mouth daily.   mycophenolate (MYFORTIC) 180 MG EC tablet Take 360 mg by mouth 2 (two) times daily.   oxyCODONE (OXY IR/ROXICODONE) 5 MG immediate release tablet Take 1 tablet (5 mg total) by mouth every 6  (six) hours as needed for severe pain. For AFTER surgery only, do not take and drive (Patient taking differently: Take 5 mg by mouth 2 (two) times daily as needed for severe pain.)   pantoprazole (PROTONIX) 40 MG tablet Take 1 tablet (40 mg total) by mouth 2 (two) times daily.   predniSONE (DELTASONE) 5 MG tablet Take 5 mg by mouth daily.    rosuvastatin (CRESTOR) 5 MG tablet Take 5 mg by mouth at bedtime.   senna-docusate (SENOKOT-S) 8.6-50 MG tablet Take 2 tablets by mouth at bedtime. For AFTER surgery, do not take if having diarrhea   tamoxifen (NOLVADEX) 20 MG tablet TAKE 1 TABLET(20 MG) BY MOUTH DAILY   tirzepatide (MOUNJARO) 12.5 MG/0.5ML Pen Inject 12.5 mg into the skin once a week.   No current facility-administered medications for this visit. (Other)   REVIEW OF SYSTEMS: ROS   Positive for: Endocrine, Eyes, Allergic/Imm Negative for: Constitutional, Gastrointestinal, Neurological, Skin, Genitourinary, Musculoskeletal, HENT, Cardiovascular, Respiratory, Psychiatric, Heme/Lymph Last edited by Etheleen Mayhew, COT on 10/30/2022  1:27 PM.      ALLERGIES Allergies  Allergen Reactions   Docetaxel Nausea Only    Diaphoretic.  Patient had hypersensitivity reaction to docetaxel.  See progress note from 05/24/2021.  Patient able to complete infusion.   Norvasc [Amlodipine] Swelling   Tape Other (See Comments)    Burns skin.  Please use paper tape only   PAST MEDICAL HISTORY Past Medical History:  Diagnosis Date   Allergy    pollen   Anemia    Anxiety    BRCA2 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 03/01/2021   Breast cancer [facility])    Cataract    surgical repair bilateral   Complication of anesthesia    not herself for 2 days after last surgery (brest lumpectomy)   Coronary artery disease    Depression    ESRD on hemodialysis (HCC)    Home HD 5x per week- not on dialysis now had tramsplant 4/17   Family history of ovarian cancer 02/22/2021   GERD (gastroesophageal reflux  disease)    Hearing loss 2017   right ear   History of blood transfusion    transfusion reaction   Hyperlipidemia    Hypertension    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 per pt-dr levy also II; Per patient 08/18/21 she is Type 2   Kidney transplant recipient    Leukocytosis 06/13/2021   Neuromuscular disorder (HCC)    NEUROPATHY   Sleep apnea    Past Surgical History:  Procedure Laterality Date   ABDOMINAL HYSTERECTOMY     BREAST EXCISIONAL BIOPSY Right 08/2015   BREAST LUMPECTOMY WITH RADIOACTIVE SEED LOCALIZATION Right 09/14/2015  Procedure: RIGHT BREAST LUMPECTOMY WITH RADIOACTIVE SEED LOCALIZATION;  Surgeon: Manus Rudd, MD;  Location: [facility];  Service: General;  Laterality: Right;   BREAST LUMPECTOMY WITH RADIOACTIVE SEED LOCALIZATION Left 03/02/2021   Procedure: LEFT BREAST SEED LUMPECTOMY LEFT SENTINEL LYMPH NODE MAPPING;  Surgeon: Harriette Bouillon, MD;  Location: [facility];  Service: General;  Laterality: Left;  GEN AND PEC BLOCK   BREAST SURGERY Bilateral    biopsy bilateral   CARDIAC CATHETERIZATION     CATARACT EXTRACTION Bilateral    bilateral   CHOLECYSTECTOMY     CYST REMOVAL NECK     DIALYSIS FISTULA CREATION Left    EYE SURGERY Bilateral    lazer   kidney transplant     LEFT HEART CATH AND CORONARY ANGIOGRAPHY N/A 03/04/2019   Procedure: LEFT HEART CATH AND CORONARY ANGIOGRAPHY;  Surgeon: Swaziland, Peter M, MD;  Location: [facility] INVASIVE CV LAB;  Service: Cardiovascular;  Laterality: N/A;   LEFT HEART CATHETERIZATION WITH CORONARY ANGIOGRAM N/A 03/30/2014   Procedure: LEFT HEART CATHETERIZATION WITH CORONARY ANGIOGRAM;  Surgeon: Lesleigh Noe, MD;  Location: [facility] CATH LAB;  Service: Cardiovascular;  Laterality: N/A;   LIPOMA EXCISION N/A 02/06/2017   Procedure: EXCISION POSTERIOR NECK SEBACEOUS CYST;  Surgeon: Abigail Miyamoto, MD;  Location: [facility] OR;  Service: General;   Laterality: N/A;   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   Procedure: REPAIR OF ARTERIOVENOUS FISTULA ANEURYSM;  Surgeon: Nada Libman, MD;  Location: [facility] OR;  Service: Vascular;  Laterality: Left;   REVISON OF ARTERIOVENOUS FISTULA Left 09/28/2013   Procedure: EXCISE ESCHAR LEFT ARM  ARTERIOVENOUS FISTULA WITH PLICATION OF LEFT ARM ARTERIOVENOUS FISTULA;  Surgeon: Sherren Kerns, MD;  Location: [facility] OR;  Service: Vascular;  Laterality: Left;   REVISON OF ARTERIOVENOUS FISTULA Left 08/26/2015   Procedure: RESECTION ANEURYSM OF LEFT ARM ARTERIOVENOUS FISTULA  ;  Surgeon: Nada Libman, MD;  Location: [facility];  Service: Vascular;  Laterality: Left;   ROBOTIC ASSISTED BILATERAL SALPINGO OOPHERECTOMY N/A 07/11/2022   Procedure: XI ROBOTIC ASSISTED BILATERAL SALPINGO OOPHORECTOMY;  Surgeon: Carver Fila, MD;  Location: [facility];  Service: Gynecology;  Laterality: N/A;   SIMPLE MASTECTOMY WITH AXILLARY SENTINEL NODE BIOPSY Bilateral 08/22/2021   Procedure: BILATERAL SIMPLE MASTECTOMY;  Surgeon: Harriette Bouillon, MD;  Location: [facility];  Service: General;  Laterality: Bilateral;   TUBAL LIGATION     FAMILY HISTORY Family History  Problem Relation Age of Onset   Diabetes Mother    Kidney disease Mother    Diabetes Father    Heart disease Father    Kidney disease Father    Hypertension Father    Diabetes Sister    Asthma Sister    Lupus Sister    Diabetes Brother    Diabetes Brother    Diabetes Brother    Diabetes Brother    Cancer Maternal Grandmother 50       ovarian cancer   Ovarian cancer Maternal Grandmother    Diabetes Maternal Grandfather    Diabetes Paternal Grandmother    Colon cancer Neg Hx    Colon polyps Neg Hx    Esophageal cancer Neg Hx    Rectal cancer Neg Hx    Stomach cancer Neg Hx    Breast cancer Neg Hx    Endometrial cancer Neg Hx    Pancreatic cancer Neg Hx    Prostate cancer Neg Hx    Inflammatory bowel disease Neg Hx  Liver disease  Neg Hx    SOCIAL HISTORY Social History   Tobacco Use   Smoking status: Never   Smokeless tobacco: Never  Vaping Use   Vaping Use: Never used  Substance Use Topics   Alcohol use: Not Currently    Comment: Socially   Drug use: No       OPHTHALMIC EXAM:  Base Eye Exam     Visual Acuity (Snellen - Linear)       Right Left   Dist cc 20/25 -3 20/20   Dist ph cc NI NI    Correction: Glasses         Tonometry (Tonopen, 1:30 PM)       Right Left   Pressure 18 16         Pupils       Pupils   Right PERRL   Left PERRL         Visual Fields       Left Right    Full Full         Extraocular Movement       Right Left    Full, Ortho Full, Ortho         Neuro/Psych     Oriented x3: Yes   Mood/Affect: Normal         Dilation     Both eyes: 2.5% Phenylephrine @ 1:28 PM           Slit Lamp and Fundus Exam     Slit Lamp Exam       Right Left   Lids/Lashes Dermatochalasis - upper lid Dermatochalasis - upper lid   Conjunctiva/Sclera nasal and temporal pinguecula nasal and temporal pinguecula   Cornea trace PEE, early band K nasal and temporal trace PEE, early band K nasal and temporal   Anterior Chamber deep and clear deep and clear   Iris Round and moderately dilated to 4.0, No NVI Round and moderately dilated to 4.75, No NVI   Lens Posterior chamber intraocular lens, 1+ Posterior capsular opacification Posterior chamber intraocular lens, 1+ Posterior capsular opacification   Anterior Vitreous Vitreous syneresis mild syneresis, vitreous condensations/fibrosis         Fundus Exam       Right Left   Disc trace Pallor, +PPP, no NVD, Sharp rim Trace Pallor, +fibrosis, no NVD, +PPP IT   C/D Ratio 0.3 0.4   Macula Flat, Blunted foveal reflex, rare MA, focal laser scars temporally Flat, Blunted foveal reflex, rare MA, focal laser scars temporally   Vessels Severe attenuation, +sclerosis / sheathing, Tortuous, Copper wiring Severe  attenuation, Copper wiring, +perivascular sheathing, Tortuous, AV crossing changes, regressed NVD   Periphery Attached, dense 360 PRP Attached, 360 PRP, room for fill in if needed           Refraction     Wearing Rx       Sphere Cylinder Axis   Right -0.50 +2.25 084   Left [place] +2.00 093           IMAGING AND PROCEDURES  Imaging and Procedures for 10/30/2022  OCT, Retina - OU - Both Eyes       Right Eye Quality was good. Central Foveal Thickness: 246. Progression has been stable. Findings include normal foveal contour, no IRF, no SRF, outer retinal atrophy (No DME; +ORA corresponding to laser changes -- stable).   Left Eye Quality was good. Central Foveal Thickness: 228. Progression has been stable. Findings include normal foveal contour, no IRF,  no SRF, outer retinal atrophy (No DME; patches of ORA corresponding to laser changes, +ERM).   Notes *Images captured and stored on drive  Diagnosis / Impression:  OD: No DME; +ORA corresponding to laser changes OS: No DME; patches of ORA corresponding to laser changes, +ERM  Clinical management:  See below  Abbreviations: NFP - Normal foveal profile. CME - cystoid macular edema. PED - pigment epithelial detachment. IRF - intraretinal fluid. SRF - subretinal fluid. EZ - ellipsoid zone. ERM - epiretinal membrane. ORA - outer retinal atrophy. ORT - outer retinal tubulation. SRHM - subretinal hyper-reflective material. IRHM - intraretinal hyper-reflective material             ASSESSMENT/PLAN:    ICD-10-CM   1. Stable proliferative diabetic retinopathy of both eyes associated with type 2 diabetes mellitus (HCC)  E11.3553 OCT, Retina - OU - Both Eyes    2. Current use of insulin (HCC)  Z79.4     3. Long-term (current) use of injectable non-insulin antidiabetic drugs  Z79.85     4. Essential hypertension  I10     5. Hypertensive retinopathy of both eyes  H35.033     6. Pseudophakia, both eyes  Z96.1      1-3.  Proliferative diabetic retinopathy w/o DME OU  - s/p PRP OU and focal laser OU (?early 2000's - [place], [place]) - exam shows rare MA, dense PRP 360 OU -- stable exam - BCVA stable at 20/25 OD, 20/20 OS - FA (04.03.23) shows mild MA, no NV OU - OCT without diabetic macular edema, both eyes  - discussed findings and prognosis - f/u in 1 yr -- DFE/OCT  4,5. Hypertensive retinopathy OU - discussed importance of tight BP control - monitor  6. Pseudophakia OU  - s/p CE/IOL OU  - IOLs in good position, doing well  - monitor  Ophthalmic Meds Ordered this visit:  No orders of the defined types were placed in this encounter.    Return in about 1 year (around 10/30/2023) for f/u PDR OU, DFE, OCT.  There are no Patient Instructions on file for this visit.   Explained the diagnoses, plan, and follow up with the patient and they expressed understanding.  Patient expressed understanding of the importance of proper follow up care.   This document serves as a record of services personally performed by Karie Chimera, MD, PhD. It was created on their behalf by Gerilyn Nestle, COT an ophthalmic technician. The creation of this record is the provider's dictation and/or activities during the visit.    Electronically signed by:  Gerilyn Nestle, COT  04.23.24 2:25 AM  This document serves as a record of services personally performed by Karie Chimera, MD, PhD. It was created on their behalf by Glee Arvin. Manson Passey, OA an ophthalmic technician. The creation of this record is the provider's dictation and/or activities during the visit.    Electronically signed by: Glee Arvin. Manson Passey, [place] 05.07.2024 2:25 AM  Karie Chimera, M.D., Ph.D. Diseases & Surgery of the Retina and Vitreous [facility]  I have reviewed the above documentation for accuracy and completeness, and I agree with the above. Karie Chimera, M.D., Ph.D. 11/03/22 2:25 AM  Abbreviations: M myopia  (nearsighted); A astigmatism; H hyperopia (farsighted); P presbyopia; Mrx spectacle prescription;  CTL contact lenses; OD right eye; OS left eye; OU both eyes  XT exotropia; ET esotropia; PEK punctate epithelial keratitis; PEE punctate epithelial erosions; DES dry eye syndrome;  MGD meibomian gland dysfunction; ATs artificial tears; PFAT's preservative free artificial tears; [place] nuclear sclerotic cataract; PSC posterior subcapsular cataract; ERM epi-retinal membrane; PVD posterior vitreous detachment; RD retinal detachment; DM diabetes mellitus; DR diabetic retinopathy; NPDR non-proliferative diabetic retinopathy; PDR proliferative diabetic retinopathy; CSME clinically significant macular edema; DME diabetic macular edema; dbh dot blot hemorrhages; CWS cotton wool spot; POAG primary open angle glaucoma; C/D cup-to-disc ratio; HVF humphrey visual field; GVF goldmann visual field; OCT optical coherence tomography; IOP intraocular pressure; BRVO Branch retinal vein occlusion; CRVO central retinal vein occlusion; CRAO central retinal artery occlusion; BRAO branch retinal artery occlusion; RT retinal tear; SB scleral buckle; PPV pars plana vitrectomy; VH Vitreous hemorrhage; PRP panretinal laser photocoagulation; IVK intravitreal kenalog; VMT vitreomacular traction; MH Macular hole;  NVD neovascularization of the disc; NVE neovascularization elsewhere; AREDS age related eye disease study; ARMD age related macular degeneration; POAG primary open angle glaucoma; EBMD epithelial/anterior basement membrane dystrophy; ACIOL anterior chamber intraocular lens; IOL intraocular lens; PCIOL posterior chamber intraocular lens; Phaco/IOL phacoemulsification with intraocular lens placement; [place] photorefractive keratectomy; LASIK laser assisted in situ keratomileusis; HTN hypertension; DM diabetes mellitus; COPD chronic obstructive pulmonary disease

## 2022-10-25 ENCOUNTER — Encounter: Payer: Medicare HMO | Admitting: Physical Therapy

## 2022-10-29 ENCOUNTER — Ambulatory Visit (HOSPITAL_BASED_OUTPATIENT_CLINIC_OR_DEPARTMENT_OTHER): Payer: Medicare HMO | Admitting: Cardiology

## 2022-10-30 ENCOUNTER — Ambulatory Visit (INDEPENDENT_AMBULATORY_CARE_PROVIDER_SITE_OTHER): Payer: Medicare HMO | Admitting: Ophthalmology

## 2022-10-30 ENCOUNTER — Encounter (INDEPENDENT_AMBULATORY_CARE_PROVIDER_SITE_OTHER): Payer: Self-pay | Admitting: Ophthalmology

## 2022-10-30 DIAGNOSIS — E113553 Type 2 diabetes mellitus with stable proliferative diabetic retinopathy, bilateral: Secondary | ICD-10-CM | POA: Diagnosis not present

## 2022-10-30 DIAGNOSIS — Z7985 Long-term (current) use of injectable non-insulin antidiabetic drugs: Secondary | ICD-10-CM | POA: Diagnosis not present

## 2022-10-30 DIAGNOSIS — Z961 Presence of intraocular lens: Secondary | ICD-10-CM

## 2022-10-30 DIAGNOSIS — Z794 Long term (current) use of insulin: Secondary | ICD-10-CM | POA: Diagnosis not present

## 2022-10-30 DIAGNOSIS — I10 Essential (primary) hypertension: Secondary | ICD-10-CM

## 2022-10-30 DIAGNOSIS — H35033 Hypertensive retinopathy, bilateral: Secondary | ICD-10-CM

## 2022-11-01 ENCOUNTER — Ambulatory Visit: Payer: Medicare HMO | Attending: Urology | Admitting: Physical Therapy

## 2022-11-01 DIAGNOSIS — M6281 Muscle weakness (generalized): Secondary | ICD-10-CM | POA: Diagnosis present

## 2022-11-01 DIAGNOSIS — R279 Unspecified lack of coordination: Secondary | ICD-10-CM | POA: Diagnosis present

## 2022-11-01 DIAGNOSIS — M5459 Other low back pain: Secondary | ICD-10-CM

## 2022-11-01 DIAGNOSIS — M62838 Other muscle spasm: Secondary | ICD-10-CM

## 2022-11-01 DIAGNOSIS — R293 Abnormal posture: Secondary | ICD-10-CM | POA: Diagnosis present

## 2022-11-01 NOTE — Therapy (Signed)
OUTPATIENT PHYSICAL THERAPY FEMALE PELVIC TREATMENT   Patient Name: Cynthia [name] MRN: [id number] DOB:Jun 22, 1971, 52 y.o., female Today's Date: 11/02/22   END OF SESSION:   PT End of Session - 11/01/22 1408     Visit Number 5    Number of Visits 8    Date for PT Re-Evaluation 02/01/23    Authorization Type [employer] medicare - approved 8 until 6/13    Authorization - Visit Number 4    Authorization - Number of Visits 8    PT Start Time 1403    PT Stop Time 1443    PT Time Calculation (min) 40 min    Activity Tolerance Patient tolerated treatment well    Behavior During Therapy [facility] for tasks assessed/performed                Past Medical History:  Diagnosis Date   Allergy    pollen   Anemia    Anxiety    BRCA2 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 03/01/2021   Breast cancer [facility])    Cataract    surgical repair bilateral   Complication of anesthesia    not herself for 2 days after last surgery (brest lumpectomy)   Coronary artery disease    Depression    ESRD on hemodialysis (HCC)    Home HD 5x per week- not on dialysis now had tramsplant 4/17   Family history of ovarian cancer 02/22/2021   GERD (gastroesophageal reflux disease)    Hearing loss 2017   right ear   History of blood transfusion    transfusion reaction   Hyperlipidemia    Hypertension    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 per pt-dr levy also II; Per patient 08/18/21 she is Type 2   Kidney transplant recipient    Leukocytosis 06/13/2021   Neuromuscular disorder (HCC)    NEUROPATHY   Sleep apnea    Past Surgical History:  Procedure Laterality Date   ABDOMINAL HYSTERECTOMY     BREAST EXCISIONAL BIOPSY Right 08/2015   BREAST LUMPECTOMY WITH RADIOACTIVE SEED LOCALIZATION Right 09/14/2015   Procedure: RIGHT BREAST LUMPECTOMY WITH RADIOACTIVE SEED LOCALIZATION;  Surgeon: Manus Rudd, MD;  Location: [facility];  Service:  General;  Laterality: Right;   BREAST LUMPECTOMY WITH RADIOACTIVE SEED LOCALIZATION Left 03/02/2021   Procedure: LEFT BREAST SEED LUMPECTOMY LEFT SENTINEL LYMPH NODE MAPPING;  Surgeon: Harriette Bouillon, MD;  Location: [facility];  Service: General;  Laterality: Left;  GEN AND PEC BLOCK   BREAST SURGERY Bilateral    biopsy bilateral   CARDIAC CATHETERIZATION     CATARACT EXTRACTION Bilateral    bilateral   CHOLECYSTECTOMY     CYST REMOVAL NECK     DIALYSIS FISTULA CREATION Left    EYE SURGERY Bilateral    lazer   kidney transplant     LEFT HEART CATH AND CORONARY ANGIOGRAPHY N/A 03/04/2019   Procedure: LEFT HEART CATH AND CORONARY ANGIOGRAPHY;  Surgeon: Swaziland, Peter M, MD;  Location: [facility] INVASIVE CV LAB;  Service: Cardiovascular;  Laterality: N/A;   LEFT HEART CATHETERIZATION WITH CORONARY ANGIOGRAM N/A 03/30/2014   Procedure: LEFT HEART CATHETERIZATION WITH CORONARY ANGIOGRAM;  Surgeon: Lesleigh Noe, MD;  Location: [facility] CATH LAB;  Service: Cardiovascular;  Laterality: N/A;   LIPOMA EXCISION N/A 02/06/2017   Procedure: EXCISION POSTERIOR NECK SEBACEOUS CYST;  Surgeon: Abigail Miyamoto, MD;  Location: [facility] OR;  Service: General;  Laterality: N/A;  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   Procedure: REPAIR OF ARTERIOVENOUS FISTULA ANEURYSM;  Surgeon: Nada Libman, MD;  Location: [facility] OR;  Service: Vascular;  Laterality: Left;   REVISON OF ARTERIOVENOUS FISTULA Left 09/28/2013   Procedure: EXCISE ESCHAR LEFT ARM  ARTERIOVENOUS FISTULA WITH PLICATION OF LEFT ARM ARTERIOVENOUS FISTULA;  Surgeon: Sherren Kerns, MD;  Location: [facility] OR;  Service: Vascular;  Laterality: Left;   REVISON OF ARTERIOVENOUS FISTULA Left 08/26/2015   Procedure: RESECTION ANEURYSM OF LEFT ARM ARTERIOVENOUS FISTULA  ;  Surgeon: Nada Libman, MD;  Location: [facility] OR;  Service: Vascular;  Laterality: Left;   ROBOTIC ASSISTED BILATERAL SALPINGO OOPHERECTOMY N/A 07/11/2022   Procedure: XI  ROBOTIC ASSISTED BILATERAL SALPINGO OOPHORECTOMY;  Surgeon: Carver Fila, MD;  Location: [facility]S;  Service: Gynecology;  Laterality: N/A;   SIMPLE MASTECTOMY WITH AXILLARY SENTINEL NODE BIOPSY Bilateral 08/22/2021   Procedure: BILATERAL SIMPLE MASTECTOMY;  Surgeon: Harriette Bouillon, MD;  Location: [facility] OR;  Service: General;  Laterality: Bilateral;   TUBAL LIGATION     Patient Active Problem List   Diagnosis Date Noted   Gastroesophageal reflux disease without esophagitis 07/28/2022   AKI (acute kidney injury/renal transplant on immunosuppresants  08/24/2021   BRCA2 gene mutation positive s/p bilateral masectomy  08/22/2021   PICC (peripherally inserted central catheter) in place 06/13/2021   Stage 3a chronic kidney disease (CKD) (HCC) 05/28/2021   Thrombocytopenia (HCC) 05/28/2021   Aortic atherosclerosis (HCC) 03/07/2021   Hypercholesterolemia 03/07/2021   Family history of ovarian cancer 02/22/2021   Malignant neoplasm of lower-inner quadrant of left breast in female, estrogen receptor positive (HCC) 02/15/2021   Foot pain, bilateral 07/28/2020   Insulin dependent type 2 diabetes mellitus (HCC) 06/06/2019   DKA (diabetic ketoacidoses) 06/02/2019   HTN (hypertension) 03/04/2019   Abnormal nuclear stress test 03/04/2019   Diabetic nephropathy (HCC) 03/03/2019   History of renal transplant 11/18/2015   Diabetic retinopathy associated with type 2 diabetes mellitus (HCC) 04/13/2015   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   Chronic constipation 07/28/2013   Paresthesias 07/28/2013   Diabetes mellitus type I (HCC)    Kidney transplant status 05/28/2013   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/11/2013   Precordial chest pain 02/10/2013   Awaiting organ transplant 09/19/2012    PCP: Loura Back   REFERRING PROVIDER: Crist Fat, MD   REFERRING DIAG:  N39.41 (ICD-10-CM) - Urge incontinence  R39.15 (ICD-10-CM) - Urgency of  urination    THERAPY DIAG:  Abnormal posture  Muscle weakness (generalized)  Other muscle spasm  Unspecified lack of coordination  Other low back pain  Rationale for Evaluation and Treatment: Rehabilitation  ONSET DATE: 1 year  SUBJECTIVE:  SUBJECTIVE STATEMENT: Pt having a little more back pain today Fluid intake: Yes: 3-4 bottles    PAIN:  PAIN:  Are you having pain? Yes NPRS scale: 6/10 Pain location: back and upper traps Pain orientation: Bilateral  PAIN TYPE: aching Pain description: intermittent  Aggravating factors: house work and being up Relieving factors: sitting    PRECAUTIONS: None  WEIGHT BEARING RESTRICTIONS: No  FALLS:  Has patient fallen in last 6 months? No  LIVING ENVIRONMENT: Lives with:  partner, daughter, granddaughter Lives in: House/apartment   OCCUPATION: no  PLOF: Independent  PATIENT GOALS: not have leakage and have more time to get to bathroom  PERTINENT HISTORY:  Abdominal hysterectomy, breast cancer 202 Sexual abuse: No  BOWEL MOVEMENT: Pain with bowel movement: Yes Type of bowel movement:Type (Bristol Stool Scale) hard, Frequency every day, and Strain Yes Fully empty rectum: No Leakage: No Pads: No Fiber supplement:   URINATION: Pain with urination: No Fully empty bladder: No leaks after going Stream: Strong Urgency: Yes: but can hold it Frequency: 3 hours Leakage: Urge to void, Walking to the bathroom, Coughing, and Sneezing Pads: Yes: 2/day  INTERCOURSE: Pain with intercourse: not sexually active d/t erectile disfunction   PREGNANCY: Vaginal deliveries 3   PROLAPSE: None   OBJECTIVE:   DIAGNOSTIC FINDINGS:    PATIENT SURVEYS:    PFIQ-7 = 33  COGNITION: Overall cognitive status: Within functional limits for  tasks assessed     SENSATION: Light touch: Appears intact Proprioception: Appears intact  MUSCLE LENGTH: Hamstrings: Right 60 deg; Left 70 deg Thomas test:   LUMBAR SPECIAL TESTS:  Straight leg raise test: Positive Rt hip and low back with Rt PSLR  FUNCTIONAL TESTS:  Single leg stand unsteady bil and pain on right side with <2 sec hold  GAIT:  Comments: short step length and slow cadence  POSTURE: weight shift right  PELVIC ALIGNMENT:   LUMBARAROM/PROM:  A/PROM A/PROM  eval  Flexion 75% +pain coming back to standing  Extension   Right lateral flexion 90%  Left lateral flexion 75%+pain  Right rotation Pain on Rt  Left rotation WFL    (Blank rows = not tested)  LOWER EXTREMITY ROM:  Passive ROM Right eval Left eval  Hip flexion 75% 90%  Hip extension    Hip abduction    Hip adduction    Hip internal rotation 80% 90%  Hip external rotation 75% 80%  Knee flexion    Knee extension    Ankle dorsiflexion    Ankle plantarflexion    Ankle inversion    Ankle eversion     (Blank rows = not tested)  LOWER EXTREMITY MMT:  MMT Right eval Left eval  Hip flexion    Hip extension    Hip abduction 4/5 4/5  Hip adduction 4/5 4/5  Hip internal rotation    Hip external rotation    Knee flexion    Knee extension    Ankle dorsiflexion    Ankle plantarflexion    Ankle inversion    Ankle eversion     PALPATION:   General  lumbar and gluteals tight; Rt SI and gluteals tender to palpation, hamstirngs and adductors tight Rt>Lt                External Perineal Exam can do kegel with gross assessment outside clothing  Internal Pelvic Floor   Patient confirms identification and approves PT to assess internal pelvic floor and treatment Yes  PELVIC MMT:   MMT eval  Vaginal 3/5 x 5 reps; decreased hold after 4 sec but not able to fully relax after hold  Internal Anal Sphincter   External Anal Sphincter   Puborectalis   Diastasis Recti    (Blank rows = not tested)        TONE: na  PROLAPSE: na  TODAY'S TREATMENT:                                                                                                                              DATE: 11/01/22   Lumbar and gluteal STM and trigger point Trigger Point Dry-Needling  Treatment instructions: Expect mild to moderate muscle soreness. S/S of pneumothorax if dry needled over a lung field, and to seek immediate medical attention should they occur. Patient verbalized understanding of these instructions and education.  Patient Consent Given: Yes Education handout provided: Yes Muscles treated: lumbar multifidi Electrical stimulation performed: No Parameters: N/A Treatment response/outcome: increased soft tissue length  Exercises: Cat cow table Lean on table Upper trap stretch  PATIENT EDUCATION:  Education details: toileting techniques, deep breathing, Access Code: [email or web address] Person educated: Patient Education method: Explanation Education comprehension: verbalized understanding and needs further education  HOME EXERCISE PROGRAM: Access Code: [email or web address]    ASSESSMENT:  CLINICAL IMPRESSION: Pt was educated on urge techniques and stretches for improved back mobility.  Pt is still making progress towards functional goals.  Pain level has improved overall.  Goals updated today.  Pt will be traveling but is recommended to return for 4 more visits when she returns in June to ensure improved pain management and be able to focus on pelvic floor coordination. Pt is expected to continue to make progress with skilled PT.  Pt will benefit from skilled PT to continue to address improved bowel and bladder function.  OBJECTIVE IMPAIRMENTS: decreased activity tolerance, decreased coordination, decreased endurance, decreased ROM, decreased strength, increased muscle spasms, impaired flexibility, impaired tone, postural dysfunction, and pain.   ACTIVITY LIMITATIONS: continence and  toileting  PARTICIPATION LIMITATIONS: cleaning, interpersonal relationship, and community activity  PERSONAL FACTORS: Age and 3+ comorbidities: Abdominal hysterectomy, breast cancer 2022, 3 vaginal deliveries  are also affecting patient's functional outcome.   REHAB POTENTIAL: Excellent  CLINICAL DECISION MAKING: Evolving/moderate complexity  EVALUATION COMPLEXITY: Moderate   GOALS: Goals reviewed with patient? Yes  SHORT TERM GOALS: Target date: 10/08/22  Ind with how to correctly do a kegel and able to relax and bulge Baseline: Goal status: MET  2.  Ind with healthy bladder and toileting habits Baseline:  Goal status: MET   LONG TERM GOALS: Target date: 02/01/23  Pt will be independent with advanced HEP to maintain improvements made throughout therapy  Baseline:  Goal status: IN PROGRESS  2.  Pt will report 80% reduction of pain due to improvements in posture, strength,  and muscle length  Baseline:  Goal status: IN PROGRESS  3.  Pt will have 80% reduced leakage during a typical day  Baseline:  Goal status: IN PROGRESS  4.  Pt will have to use 1 liner or pads per day  Baseline: 3-4 Goal status: IN PROGRESS  5.  Pt will be able to void without leakage afterwards due to improved pelvic floor coordination  Baseline: same Goal status: IN PROGRESS    PLAN:  PT FREQUENCY: 1-2x/week  PT DURATION: 12 weeks  PLANNED INTERVENTIONS: Therapeutic exercises, Therapeutic activity, Neuromuscular re-education, Balance training, Gait training, Patient/Family education, Self Care, Joint mobilization, Dry Needling, Electrical stimulation, Cryotherapy, Moist heat, Taping, Traction, Biofeedback, Manual therapy, and Re-evaluation  PLAN FOR NEXT SESSION: do dry needling #5, discuss internal soft tissue work and work on bulge and gentle contraction on exhale to work on leakage   [employer], PT 11/02/2022, 8:27 AM

## 2022-11-03 ENCOUNTER — Encounter (INDEPENDENT_AMBULATORY_CARE_PROVIDER_SITE_OTHER): Payer: Self-pay | Admitting: Ophthalmology

## 2022-12-20 ENCOUNTER — Ambulatory Visit (INDEPENDENT_AMBULATORY_CARE_PROVIDER_SITE_OTHER): Payer: Medicare HMO | Admitting: Psychology

## 2022-12-20 ENCOUNTER — Ambulatory Visit: Payer: Medicare HMO | Attending: Urology | Admitting: Physical Therapy

## 2022-12-20 DIAGNOSIS — F423 Hoarding disorder: Secondary | ICD-10-CM | POA: Diagnosis not present

## 2022-12-20 DIAGNOSIS — R279 Unspecified lack of coordination: Secondary | ICD-10-CM | POA: Insufficient documentation

## 2022-12-20 DIAGNOSIS — R293 Abnormal posture: Secondary | ICD-10-CM | POA: Insufficient documentation

## 2022-12-20 DIAGNOSIS — M5459 Other low back pain: Secondary | ICD-10-CM | POA: Insufficient documentation

## 2022-12-20 DIAGNOSIS — F331 Major depressive disorder, recurrent, moderate: Secondary | ICD-10-CM | POA: Diagnosis not present

## 2022-12-20 DIAGNOSIS — M6281 Muscle weakness (generalized): Secondary | ICD-10-CM | POA: Insufficient documentation

## 2022-12-20 DIAGNOSIS — M62838 Other muscle spasm: Secondary | ICD-10-CM | POA: Insufficient documentation

## 2022-12-20 NOTE — Progress Notes (Signed)
Behavioral Health Initial Adult Intake  Name: Cynthia [name] Date: 12/20/2022 MRN: [id number] DOB: 10-Jun-1971 PCP: Cynthia Back, NP  Time spent: 12:01 PM - 1:06 PM  Guardian/Payee:  Self    Paperwork requested: Yes    Today I met with Cynthia [name]  for in office face-to-face individual psychotherapy.   Reason for Visit /Presenting Problem: Cynthia [name] is a 52 year old Hispanic female.  She is disabled and cohabitates with her partner of 10 years..  She has three daughters who are not supportive of her significant other.  Her daughters don't accept him and it makes her "feel really bad."  They are also different with her and have become more distant.  They don't communicate with her "like they should."  Cynthia [name] notes that her trip to see her sister in [place] was helpful. The one thing that makes her feel better is spending time with her grandchildren.  She used to take care of Cynthia [name] but now she goes to daycare ("They took her away from me") and this made her feel worse.  Cynthia [name] reports a long history of depression.  She started to feel depressed 10 years ago when she had a kidney transplant.  Two years ago, her depression got worse, when they detected cancer in her breast and was treated with chemo and a double mastectomy.  Cynthia [name] felt "mutilated."  She is still considering breast reconstruction but feels like she need to rest her body after so many surgeries.  She states that a lot has happened that she still hasn't been able to process.  Her sister has been very supportive and has helped motivate her.    Cynthia [name] admits that she was stressed that her house was very cluttered.  For two years she had been going to yard sales buying all manner of things.  She states that it helped her feel better.  It was a "refugio" and it made her feel "llena."  The more her daughters told her to stop buying and get rid of things, she feels she did it more in rebellion.  She admits that when she  realized that her rooms and garage were full of things, she finally stopped buying.  With her permission, while she was in [place] her daughters cleaned her house out.  At first she was stressed to return and have her things thrown out.  But now she is feeling more comfortable and relaxed without all the clutter.  She still has the upstairs room to declutter and discard her excessive acquisitions and is working on it "little by little."  Partner - Cynthia [name] (75) he is planning to retire in December.  He works in a company the [employer].  When he retires he wants to return to [place].  His parents age aging and are not well.  They have been together for 10 years.  He loves her ans has been very supportive.  It makes her feel sad that he is leaving.  He has invited her to go but she doesn't think she can.  If she leaves, she will use her health care and other benefits.  She will also lose her daughters and grandchildren.  Cynthia [name] feels very conflicted.  Cynthia [name] has three daughters and all of them suffer from anxiety.  They have all gone to therapy to address their problems with anxiety and take medication.  Cynthia [name] (1989) - she has one daughter, Cynthia [name] (1y 29m). She will be marrying her partner this fall.  Her partner  shares custody of his daughter (53) with his ex-wife Cynthia [name].  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  She works at night and isn't able to see her daughter much.  Cynthia [name] (31) she is spearated from her husband.  She has a daughter named Cynthia [name] (10).  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  She also have anxiety.  Cynthia [name] ('89) she lives with her partner Cynthia [name].  They have a 48 month old named Cynthia [name].  They lived with her partner's family.  They also work at the same [employer] works at.  She will receive her degree in accounting in December.    Mental Status Exam: Appearance:   Casual     Behavior:   Appropriate  Motor:  Normal  Speech/Language:   NA  Affect:  Appropriate, Depressed, and Tearful  Mood:  normal  Thought process:  normal  Thought content:    WNL  Sensory/Perceptual disturbances:    WNL  Orientation:  oriented to person, place, time/date, and situation  Attention:  Good  Concentration:  Good  Memory:  WNL  Fund of knowledge:   Good  Insight:    Good  Judgment:   Good  Impulse Control:  Fair    Reported Symptoms:    Risk Assessment: Danger to Self:  No Self-injurious Behavior: No Danger to Others: No Duty to Warn:no Physical Aggression / Violence:No  Access to Firearms a concern: No   Substance Abuse History: Current substance abuse: No     Past Psychiatric History:   Previous psychological history is significant for anxiety and depression Outpatient Providers: Cynthia Back, PA - medication History of Psych Hospitalization: No  Psychological Testing:  n/a    Abuse History:  Victim of: No.,  will continue to gather information in upcoming sessions.    Report needed: No. Victim of Neglect:No. Perpetrator of  n/a   Witness / Exposure to Domestic Violence: No   Protective Services Involvement: No  Witness to [employer] Violence:  No   Family History:  Family History  Problem Relation Age of Onset   Diabetes Mother    Kidney disease Mother    Diabetes Father    Heart disease Father    Kidney disease Father    Hypertension Father    Diabetes Sister    Asthma Sister    Lupus Sister    Diabetes Brother    Diabetes Brother    Diabetes Brother    Diabetes Brother    Cancer Maternal Grandmother 50       ovarian cancer   Ovarian cancer Maternal Grandmother    Diabetes Maternal Grandfather    Diabetes Paternal Grandmother    Colon cancer Neg Hx    Colon polyps Neg Hx    Esophageal cancer Neg Hx    Rectal cancer Neg Hx    Stomach cancer Neg Hx    Breast cancer Neg Hx    Endometrial cancer Neg Hx    Pancreatic cancer Neg Hx    Prostate cancer  Neg Hx    Inflammatory bowel disease Neg Hx    Liver disease Neg Hx     Living situation: the patient lives with their partner  Sexual Orientation: Straight  Relationship Status: co-habitating  Name of spouse / other: Cynthia [name] (75) he is attempting to retire in December. If a parent, number of children / ages: Three daughters from previous  marriage (see above)  Support Systems: significant other friends Adult daughters, women's church group  Financial Stress:  Yes   Income/Employment/Disability: Doctor, general practice: No   Educational History: Education:  no history - will collect information next session  Religion/Sprituality/World View: Protestant  Any cultural differences that may affect / interfere with treatment:  not applicable   Recreation/Hobbies: crafts  Stressors: Financial difficulties   Health problems   Marital or family conflict    Strengths: Supportive Relationships, Family, Friends, Warehouse manager, and Spirituality  Barriers:  Requires a Radiographer, therapeutic History: Pending legal issue / charges:  n/a. 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/2021   Breast cancer [facility])    Cataract    surgical repair bilateral   Complication of anesthesia    not herself for 2 days after last surgery (brest lumpectomy)   Coronary artery disease    Depression    ESRD on hemodialysis (HCC)    Home HD 5x per week- not on dialysis now had tramsplant 4/17   Family history of ovarian cancer 02/22/2021   GERD (gastroesophageal reflux disease)    Hearing loss 2017   right ear   History of blood transfusion    transfusion reaction   Hyperlipidemia    Hypertension    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 per pt-dr  levy also II; Per patient 08/18/21 she is Type 2   Kidney transplant recipient    Leukocytosis 06/13/2021   Neuromuscular disorder (HCC)    NEUROPATHY   Sleep apnea     Past Surgical History:  Procedure Laterality Date   ABDOMINAL HYSTERECTOMY     BREAST EXCISIONAL BIOPSY Right 08/2015   BREAST LUMPECTOMY WITH RADIOACTIVE SEED LOCALIZATION Right 09/14/2015   Procedure: RIGHT BREAST LUMPECTOMY WITH RADIOACTIVE SEED LOCALIZATION;  Surgeon: Manus Rudd, MD;  Location: [facility];  Service: General;  Laterality: Right;   BREAST LUMPECTOMY WITH RADIOACTIVE SEED LOCALIZATION Left 03/02/2021   Procedure: LEFT BREAST SEED LUMPECTOMY LEFT SENTINEL LYMPH NODE MAPPING;  Surgeon: Harriette Bouillon, MD;  Location: [facility];  Service: General;  Laterality: Left;  GEN AND PEC BLOCK   BREAST SURGERY Bilateral    biopsy bilateral   CARDIAC CATHETERIZATION     CATARACT EXTRACTION Bilateral    bilateral   CHOLECYSTECTOMY     CYST REMOVAL NECK     DIALYSIS FISTULA CREATION Left    EYE SURGERY Bilateral    lazer   kidney transplant     LEFT HEART CATH AND CORONARY ANGIOGRAPHY N/A 03/04/2019   Procedure: LEFT HEART CATH AND CORONARY ANGIOGRAPHY;  Surgeon: Swaziland, Peter M, MD;  Location: [facility];  Service: Cardiovascular;  Laterality: N/A;   LEFT HEART CATHETERIZATION WITH CORONARY ANGIOGRAM N/A 03/30/2014   Procedure: LEFT HEART CATHETERIZATION WITH CORONARY ANGIOGRAM;  Surgeon: Lesleigh Noe, MD;  Location: [facility] CATH LAB;  Service: Cardiovascular;  Laterality: N/A;   LIPOMA EXCISION N/A 02/06/2017   Procedure: EXCISION POSTERIOR NECK SEBACEOUS CYST;  Surgeon: Abigail Miyamoto, MD;  Location: [facility] OR;  Service: General;  Laterality: N/A;   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   Procedure: REPAIR OF ARTERIOVENOUS FISTULA ANEURYSM;  Surgeon: Nada Libman,  MD;  Location: [facility] OR;  Service: Vascular;  Laterality: Left;   REVISON OF  ARTERIOVENOUS FISTULA Left 09/28/2013   Procedure: EXCISE ESCHAR LEFT ARM  ARTERIOVENOUS FISTULA WITH PLICATION OF LEFT ARM ARTERIOVENOUS FISTULA;  Surgeon: Sherren Kerns, MD;  Location: [facility] OR;  Service: Vascular;  Laterality: Left;   REVISON OF ARTERIOVENOUS FISTULA Left 08/26/2015   Procedure: RESECTION ANEURYSM OF LEFT ARM ARTERIOVENOUS FISTULA  ;  Surgeon: Nada Libman, MD;  Location: [facility] OR;  Service: Vascular;  Laterality: Left;   ROBOTIC ASSISTED BILATERAL SALPINGO OOPHERECTOMY N/A 07/11/2022   Procedure: XI ROBOTIC ASSISTED BILATERAL SALPINGO OOPHORECTOMY;  Surgeon: Carver Fila, MD;  Location: [facility];  Service: Gynecology;  Laterality: N/A;   SIMPLE MASTECTOMY WITH AXILLARY SENTINEL NODE BIOPSY Bilateral 08/22/2021   Procedure: BILATERAL SIMPLE MASTECTOMY;  Surgeon: Harriette Bouillon, MD;  Location: [facility] OR;  Service: General;  Laterality: Bilateral;   TUBAL LIGATION      Medications: Current Outpatient Medications  Medication Sig Dispense Refill   Accu-Chek Softclix Lancets lancets Use as instructed (Patient taking differently: 1 each by Other route See admin instructions. Use as instructed) 100 each 12   acetaminophen (TYLENOL) 500 MG tablet Take 500 mg by mouth every 6 (six) hours as needed for mild pain, fever or headache.     aspirin EC 81 MG tablet Take 81 mg by mouth daily.     Blood Glucose Monitoring Suppl (ACCU-CHEK AVIVA CONNECT) w/Device KIT Check sugar 1x daily (Patient taking differently: 1 each by Other route See admin instructions. Check sugar 1x daily) 1 kit 0   carvedilol (COREG) 25 MG tablet Take 25 mg by mouth 2 (two) times daily with a meal.     cetirizine (ZYRTEC) 10 MG tablet Take 10 mg by mouth daily as needed for allergies.     cholecalciferol (VITAMIN D3) 25 MCG (1000 UNIT) tablet Take 1,000 Units by mouth daily.     cyanocobalamin 1000 MCG tablet Take 1,000 mcg by mouth daily.     DROPLET PEN NEEDLES 31G X 8 MM MISC      ENVARSUS XR 1 MG TB24 Take 4 mg  by mouth daily.     escitalopram (LEXAPRO) 10 MG tablet Take 10 mg by mouth daily.     Fluoxetine HCl, PMDD, 20 MG TABS Take 20 mg by mouth daily.     furosemide (LASIX) 40 MG tablet Take 40 mg by mouth 2 (two) times daily as needed for fluid.     gabapentin (NEURONTIN) 300 MG capsule Take 1 capsule (300 mg total) by mouth 2 (two) times daily. Start at 1 cap once daily, and increase to twice daily if she tolerates. Continue 600mg  at night (Patient taking differently: Take 300-600 mg by mouth See admin instructions. Take 300 mg by mouth in the morning and 600 mg at night) 60 capsule 0   Garlic 100 MG TABS Take 100 mg by mouth daily.     glucose blood (ACCU-CHEK AVIVA PLUS) test strip Check sugar 1x daily (Patient taking differently: 1 each by Other route See admin instructions. Check sugar 1x daily) 100 each 12   hydrALAZINE (APRESOLINE) 25 MG tablet Take 25 mg by mouth 2 (two) times daily.     insulin glargine (LANTUS SOLOSTAR) 100 UNIT/ML Solostar Pen Inject 25-30 Units into the skin See admin instructions. Inject 30 units into the skin in the morning and 25 units at bedtime     Insulin Pen Needle (PEN NEEDLES) 30G X 8 MM  MISC 1 each by Does not apply route daily. E11.9 90 each 0   magnesium oxide (MAG-OX) 400 MG tablet Take 400 mg by mouth 2 (two) times daily.     multivitamin-lutein (OCUVITE-LUTEIN) CAPS capsule Take 1 capsule by mouth daily.     mycophenolate (MYFORTIC) 180 MG EC tablet Take 360 mg by mouth 2 (two) times daily.     oxyCODONE (OXY IR/ROXICODONE) 5 MG immediate release tablet Take 1 tablet (5 mg total) by mouth every 6 (six) hours as needed for severe pain. For AFTER surgery only, do not take and drive (Patient taking differently: Take 5 mg by mouth 2 (two) times daily as needed for severe pain.) 15 tablet 0   pantoprazole (PROTONIX) 40 MG tablet Take 1 tablet (40 mg total) by mouth 2 (two) times daily. 60 tablet 11   predniSONE (DELTASONE) 5 MG tablet Take 5 mg by mouth daily.   2    rosuvastatin (CRESTOR) 5 MG tablet Take 5 mg by mouth at bedtime.     senna-docusate (SENOKOT-S) 8.6-50 MG tablet Take 2 tablets by mouth at bedtime. For AFTER surgery, do not take if having diarrhea 30 tablet 0   tamoxifen (NOLVADEX) 20 MG tablet TAKE 1 TABLET(20 MG) BY MOUTH DAILY 30 tablet 3   tirzepatide (MOUNJARO) 12.5 MG/0.5ML Pen Inject 12.5 mg into the skin once a week.     No current facility-administered medications for this visit.    Allergies  Allergen Reactions   Docetaxel Nausea Only    Diaphoretic.  Patient had hypersensitivity reaction to docetaxel.  See progress note from 05/24/2021.  Patient able to complete infusion.   Norvasc [Amlodipine] Swelling   Tape Other (See Comments)    Burns skin.  Please use paper tape only    Diagnoses:  Major Depressive Episode, recurrent, moderate Hoarding Disorder, with good insight   Plan of Care: Keeley Sussman is a 52 year old Hispanic female who has a long history of depression and hoarding.  Pt in at a point in her life where big changes are about to happen and she finds herself very conflicted and depressed.  Her partner of 10 years is planning to retire and will be moving to [place] to help his aging parents.  While she wishes to be with him, it seems impossible to leave her daughters and grandchildren behind, nor can she afford to lose the small amount to financial security she has here ([employer] disability & medicaid).  In addition to partner's retirement and pending departure, their is a great deal of conflict created by her daughter's disapproval of her partner.  Mariaelena is in a catch 22 situation.  If she stays, she will see more of her daughters and grandchildren, but she will lose her partner and the emotional support her provides.  If she goes, she will have even less contact with her family and will miss one of the few things that makes her feel less depressed, and she will lose her disability and health insurance, but she will  gain the companionship and support of her partner.  Patient admits to hoarding and excessive acquisition of items purchased at yard sales.  Her maladaptive coping strategy has effected her relationship with her daughters, access to her grandchildren and has ultimately worsened her depression.  It is believed that Seynabou would benefit from a weekly individual psychotherapy which would focus on providing support around her partners pending departure and decision about what is next for her. Treatment would work to  build positive coping strategies and skills which would allow patient to manage her depression and anxiety more effectively as well as prevent relapse.  This would include a coming to a better understanding of her hoarding behaviors, growing her insight about the "cost" of her maladaptive coping strategy and reestablishing trust with her daughters.  Psychotherapy will also focus on other family dynamics with her daughters, improved communication and mutual support.  Therapist will continue to monitor pt's response to medication and if necessary, follow up with PCP regarding medication.  # [name], PhD

## 2022-12-24 NOTE — Therapy (Unsigned)
OUTPATIENT PHYSICAL THERAPY FEMALE PELVIC TREATMENT   Patient Name: Cynthia [name] MRN: [id number] DOB:[birth year], 52 y.o., female Today's Date: 12/25/22   END OF SESSION:   PT End of Session - 12/25/22 1404     Visit Number 6    Number of Visits 9    Date for PT Re-Evaluation 02/01/23    Authorization Type 4 visits to 8/9    Authorization - Visit Number 5    Authorization - Number of Visits 9    PT Start Time 1405    PT Stop Time 1445    PT Time Calculation (min) 40 min    Activity Tolerance Patient tolerated treatment well    Behavior During Therapy [facility] for tasks assessed/performed                 Past Medical History:  Diagnosis Date   Allergy    pollen   Anemia    Anxiety    BRCA2 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 03/01/2021   Breast cancer [facility])    Cataract    surgical repair bilateral   Complication of anesthesia    not herself for 2 days after last surgery (brest lumpectomy)   Coronary artery disease    Depression    ESRD on hemodialysis (HCC)    Home HD 5x per week- not on dialysis now had tramsplant 4/17   Family history of ovarian cancer 02/22/2021   GERD (gastroesophageal reflux disease)    Hearing loss 2017   right ear   History of blood transfusion    transfusion reaction   Hyperlipidemia    Hypertension    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 per pt-dr levy also II; Per patient 08/18/21 she is Type 2   Kidney transplant recipient    Leukocytosis 06/13/2021   Neuromuscular disorder (HCC)    NEUROPATHY   Sleep apnea    Past Surgical History:  Procedure Laterality Date   ABDOMINAL HYSTERECTOMY     BREAST EXCISIONAL BIOPSY Right 08/2015   BREAST LUMPECTOMY WITH RADIOACTIVE SEED LOCALIZATION Right 09/14/2015   Procedure: RIGHT BREAST LUMPECTOMY WITH RADIOACTIVE SEED LOCALIZATION;  Surgeon: Manus Rudd, MD;  Location: [facility];  Service: General;  Laterality: Right;    BREAST LUMPECTOMY WITH RADIOACTIVE SEED LOCALIZATION Left 03/02/2021   Procedure: LEFT BREAST SEED LUMPECTOMY LEFT SENTINEL LYMPH NODE MAPPING;  Surgeon: Harriette Bouillon, MD;  Location: [facility];  Service: General;  Laterality: Left;  GEN AND PEC BLOCK   BREAST SURGERY Bilateral    biopsy bilateral   CARDIAC CATHETERIZATION     CATARACT EXTRACTION Bilateral    bilateral   CHOLECYSTECTOMY     CYST REMOVAL NECK     DIALYSIS FISTULA CREATION Left    EYE SURGERY Bilateral    lazer   kidney transplant     LEFT HEART CATH AND CORONARY ANGIOGRAPHY N/A 03/04/2019   Procedure: LEFT HEART CATH AND CORONARY ANGIOGRAPHY;  Surgeon: Swaziland, Peter M, MD;  Location: [facility];  Service: Cardiovascular;  Laterality: N/A;   LEFT HEART CATHETERIZATION WITH CORONARY ANGIOGRAM N/A 03/30/2014   Procedure: LEFT HEART CATHETERIZATION WITH CORONARY ANGIOGRAM;  Surgeon: Lesleigh Noe, MD;  Location: [facility] CATH LAB;  Service: Cardiovascular;  Laterality: N/A;   LIPOMA EXCISION N/A 02/06/2017   Procedure: EXCISION POSTERIOR NECK SEBACEOUS CYST;  Surgeon: Abigail Miyamoto, MD;  Location: [facility] OR;  Service: General;  Laterality: N/A;   RESECTION  OF ARTERIOVENOUS FISTULA ANEURYSM Left 07/07/2015   Procedure: REPAIR OF ARTERIOVENOUS FISTULA ANEURYSM;  Surgeon: Nada Libman, MD;  Location: [facility] OR;  Service: Vascular;  Laterality: Left;   REVISON OF ARTERIOVENOUS FISTULA Left 09/28/2013   Procedure: EXCISE ESCHAR LEFT ARM  ARTERIOVENOUS FISTULA WITH PLICATION OF LEFT ARM ARTERIOVENOUS FISTULA;  Surgeon: Sherren Kerns, MD;  Location: [facility] OR;  Service: Vascular;  Laterality: Left;   REVISON OF ARTERIOVENOUS FISTULA Left 08/26/2015   Procedure: RESECTION ANEURYSM OF LEFT ARM ARTERIOVENOUS FISTULA  ;  Surgeon: Nada Libman, MD;  Location: [facility];  Service: Vascular;  Laterality: Left;   ROBOTIC ASSISTED BILATERAL SALPINGO OOPHERECTOMY N/A 07/11/2022   Procedure: XI ROBOTIC ASSISTED BILATERAL  SALPINGO OOPHORECTOMY;  Surgeon: Carver Fila, MD;  Location: [facility];  Service: Gynecology;  Laterality: N/A;   SIMPLE MASTECTOMY WITH AXILLARY SENTINEL NODE BIOPSY Bilateral 08/22/2021   Procedure: BILATERAL SIMPLE MASTECTOMY;  Surgeon: Harriette Bouillon, MD;  Location: [facility] OR;  Service: General;  Laterality: Bilateral;   TUBAL LIGATION     Patient Active Problem List   Diagnosis Date Noted   Gastroesophageal reflux disease without esophagitis 07/28/2022   AKI (acute kidney injury/renal transplant on immunosuppresants  08/24/2021   BRCA2 gene mutation positive s/p bilateral masectomy  08/22/2021   PICC (peripherally inserted central catheter) in place 06/13/2021   Stage 3a chronic kidney disease (CKD) (HCC) 05/28/2021   Thrombocytopenia (HCC) 05/28/2021   Aortic atherosclerosis (HCC) 03/07/2021   Hypercholesterolemia 03/07/2021   Family history of ovarian cancer 02/22/2021   Malignant neoplasm of lower-inner quadrant of left breast in female, estrogen receptor positive (HCC) 02/15/2021   Foot pain, bilateral 07/28/2020   Insulin dependent type 2 diabetes mellitus (HCC) 06/06/2019   DKA (diabetic ketoacidoses) 06/02/2019   HTN (hypertension) 03/04/2019   Abnormal nuclear stress test 03/04/2019   Diabetic nephropathy (HCC) 03/03/2019   History of renal transplant 11/18/2015   Diabetic retinopathy associated with type 2 diabetes mellitus (HCC) 04/13/2015   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   Chronic constipation 07/28/2013   Paresthesias 07/28/2013   Diabetes mellitus type I [facility])    Kidney transplant status 05/28/2013   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/11/2013   Precordial chest pain 02/10/2013   Awaiting organ transplant 09/19/2012    PCP: Loura Back   REFERRING PROVIDER: Crist Fat, MD   REFERRING DIAG:  N39.41 (ICD-10-CM) - Urge incontinence  R39.15 (ICD-10-CM) - Urgency of urination    THERAPY DIAG:   Abnormal posture  Muscle weakness (generalized)  Other muscle spasm  Unspecified lack of coordination  Rationale for Evaluation and Treatment: Rehabilitation  ONSET DATE: 1 year  SUBJECTIVE:  SUBJECTIVE STATEMENT: Pt states back and down the right leg has been bothering me every day. Fluid intake: Yes: 3-4 bottles    PAIN:  PAIN:  Are you having pain? Yes NPRS scale: 5/10 Pain location: back  Pain orientation: Bilateral  PAIN TYPE: aching, radiating Pain description: intermittent and constant  Aggravating factors: nothing that I can tell in general but leaning forward to do things and then coming back upright is very painful Relieving factors: tylenol, sitting   PRECAUTIONS: None  WEIGHT BEARING RESTRICTIONS: No  FALLS:  Has patient fallen in last 6 months? No  LIVING ENVIRONMENT: Lives with:  partner, daughter, granddaughter Lives in: House/apartment   OCCUPATION: no  PLOF: Independent  PATIENT GOALS: not have leakage and have more time to get to bathroom  PERTINENT HISTORY:  Abdominal hysterectomy, breast cancer 202 Sexual abuse: No  BOWEL MOVEMENT: Pain with bowel movement: Yes Type of bowel movement:Type (Bristol Stool Scale) hard, Frequency every day, and Strain Yes Fully empty rectum: No Leakage: No Pads: No Fiber supplement:   URINATION: Pain with urination: No Fully empty bladder: No leaks after going Stream: Strong Urgency: Yes: but can hold it Frequency: 3 hours Leakage: Urge to void, Walking to the bathroom, Coughing, and Sneezing Pads: Yes: 2/day  INTERCOURSE: Pain with intercourse: not sexually active d/t erectile disfunction   PREGNANCY: Vaginal deliveries 3   PROLAPSE: None   OBJECTIVE:   DIAGNOSTIC FINDINGS:    PATIENT SURVEYS:     PFIQ-7 = 33  COGNITION: Overall cognitive status: Within functional limits for tasks assessed     SENSATION: Light touch: Appears intact Proprioception: Appears intact  MUSCLE LENGTH: Hamstrings: Right 60 deg; Left 70 deg Thomas test:   LUMBAR SPECIAL TESTS:  Straight leg raise test: Positive Rt hip and low back with Rt PSLR  FUNCTIONAL TESTS:  Single leg stand unsteady bil and pain on right side with <2 sec hold  GAIT:  Comments: short step length and slow cadence  POSTURE: weight shift right  PELVIC ALIGNMENT:   LUMBARAROM/PROM:  A/PROM A/PROM  eval  Flexion 75% +pain coming back to standing  Extension   Right lateral flexion 90%  Left lateral flexion 75%+pain  Right rotation Pain on Rt  Left rotation WFL    (Blank rows = not tested)  LOWER EXTREMITY ROM:  Passive ROM Right eval Left eval  Hip flexion 75% 90%  Hip extension    Hip abduction    Hip adduction    Hip internal rotation 80% 90%  Hip external rotation 75% 80%  Knee flexion    Knee extension    Ankle dorsiflexion    Ankle plantarflexion    Ankle inversion    Ankle eversion     (Blank rows = not tested)  LOWER EXTREMITY MMT:  MMT Right eval Left eval  Hip flexion    Hip extension    Hip abduction 4/5 4/5  Hip adduction 4/5 4/5  Hip internal rotation    Hip external rotation    Knee flexion    Knee extension    Ankle dorsiflexion    Ankle plantarflexion    Ankle inversion    Ankle eversion     PALPATION:   General  lumbar and gluteals tight; Rt SI and gluteals tender to palpation, hamstirngs and adductors tight Rt>Lt                External Perineal Exam can do kegel with gross assessment outside clothing  Internal Pelvic Floor   Patient confirms identification and approves PT to assess internal pelvic floor and treatment Yes  PELVIC MMT:   MMT eval  Vaginal 3/5 x 5 reps; decreased hold after 4 sec but not able to fully relax after hold   Internal Anal Sphincter   External Anal Sphincter   Puborectalis   Diastasis Recti   (Blank rows = not tested)        TONE: na  PROLAPSE: na  TODAY'S TREATMENT:                                                                                                                              DATE: 12/25/22   Manual: Lumbar and gluteal STM and trigger point Trigger Point Dry-Needling  Treatment instructions: Expect mild to moderate muscle soreness. S/S of pneumothorax if dry needled over a lung field, and to seek immediate medical attention should they occur. Patient verbalized understanding of these instructions and education.  Patient Consent Given: Yes Education handout provided: Yes Muscles treated: lumbar multifidi, bil gluteals (tender) Electrical stimulation performed: No Parameters: N/A Treatment response/outcome: increased soft tissue length  Patient confirms identification and approves physical therapist to perform internal soft tissue work   - Rt side levators and ileococcygeus and obturator - breathing and bulging pelvic floor  Exercises: Down dog and cal stretch along with breathing and bulging pelvic floor   PATIENT EDUCATION:  Education details: toileting techniques, deep breathing, Access Code: [email or web address] Person educated: Patient Education method: Explanation Education comprehension: verbalized understanding and needs further education  HOME EXERCISE PROGRAM: Access Code: [email or web address]    ASSESSMENT:  CLINICAL IMPRESSION: Pt was re-assessed for pelvic floor strength and coordination.  Pt is very tight and needed cues to bulge with inhale.  Pt has tension and pain on Rt side into ileococcygeus.  Pt had twitch response with dry needling.  Pt was given two stretches and breathing and bulging to work on at home and will benefit from more consistent visits.  Pt had setback since missing the past two months of PT.  Pt will benefit from skilled PT to continue to address  improved bowel and bladder function.  OBJECTIVE IMPAIRMENTS: decreased activity tolerance, decreased coordination, decreased endurance, decreased ROM, decreased strength, increased muscle spasms, impaired flexibility, impaired tone, postural dysfunction, and pain.   ACTIVITY LIMITATIONS: continence and toileting  PARTICIPATION LIMITATIONS: cleaning, interpersonal relationship, and community activity  PERSONAL FACTORS: Age and 3+ comorbidities: Abdominal hysterectomy, breast cancer 2022, 3 vaginal deliveries  are also affecting patient's functional outcome.   REHAB POTENTIAL: Excellent  CLINICAL DECISION MAKING: Evolving/moderate complexity  EVALUATION COMPLEXITY: Moderate   GOALS: Goals reviewed with patient? Yes  SHORT TERM GOALS: Target date: 10/08/22  Ind with how to correctly do a kegel and able to relax and bulge Baseline: Goal status: MET  2.  Ind with healthy bladder and toileting habits Baseline:  Goal status: MET   LONG TERM GOALS: Target date: 02/01/23  Pt will be independent with advanced HEP to maintain improvements made throughout therapy  Baseline:  Goal status: IN PROGRESS  2.  Pt will report 80% reduction of pain due to improvements in posture, strength, and muscle length  Baseline:  Goal status: IN PROGRESS  3.  Pt will have 80% reduced leakage during a typical day  Baseline: same Goal status: IN PROGRESS  4.  Pt will have to use 1 liner or pads per day  Baseline: 3-4/day  Goal status: IN PROGRESS  5.  Pt will be able to void without leakage afterwards due to improved pelvic floor coordination  Baseline: mostly sneezing, laughing, walking fast Goal status: IN PROGRESS    PLAN:  PT FREQUENCY: 1-2x/week  PT DURATION: 12 weeks  PLANNED INTERVENTIONS: Therapeutic exercises, Therapeutic activity, Neuromuscular re-education, Balance training, Gait training, Patient/Family education, Self Care, Joint mobilization, Dry Needling, Electrical  stimulation, Cryotherapy, Moist heat, Taping, Traction, Biofeedback, Manual therapy, and Re-evaluation  PLAN FOR NEXT SESSION: do dry needling #6 lumbar and gluteals as needed, discuss internal soft tissue work and work on bulge, possibly RUSI to work on improved pelvic floor bulging   [employer], PT 12/25/2022, 2:07 PM

## 2022-12-25 ENCOUNTER — Ambulatory Visit (INDEPENDENT_AMBULATORY_CARE_PROVIDER_SITE_OTHER): Payer: Medicare HMO | Admitting: Psychology

## 2022-12-25 ENCOUNTER — Ambulatory Visit: Payer: Medicare HMO | Attending: Urology | Admitting: Physical Therapy

## 2022-12-25 DIAGNOSIS — R279 Unspecified lack of coordination: Secondary | ICD-10-CM | POA: Insufficient documentation

## 2022-12-25 DIAGNOSIS — M62838 Other muscle spasm: Secondary | ICD-10-CM | POA: Insufficient documentation

## 2022-12-25 DIAGNOSIS — F331 Major depressive disorder, recurrent, moderate: Secondary | ICD-10-CM | POA: Diagnosis not present

## 2022-12-25 DIAGNOSIS — R293 Abnormal posture: Secondary | ICD-10-CM | POA: Insufficient documentation

## 2022-12-25 DIAGNOSIS — M6281 Muscle weakness (generalized): Secondary | ICD-10-CM | POA: Insufficient documentation

## 2022-12-25 NOTE — Progress Notes (Signed)
PROGRESS NOTE: Treatment Plan  Name: Cynthia [name] Date: 12/25/2022 MRN: [id number] DOB: [birth year] PCP: Loura Back, NP  Time spent: 1:01 PM - 1:58 PM   Today I met with  Cynthia [name] in remote video (Caregility) face-to-face individual psychotherapy.   Distance Site: Client's Home Orginating Site: Dr Odette Horns Remote Office Consent: Obtained verbal consent to transmit session remotely. Patient is aware of the inherent limitations in participating in virtual therapy.     Reason for Visit /Presenting Problem: Cynthia [name] is a 52 year old Hispanic female.  She is disabled and cohabitates with her partner of 10 years..  She has three daughters who are not supportive of her significant other.  Her daughters don't accept him and it makes her "feel really bad."  They are also different with her and have become more distant.  They don't communicate with her "like they should."  Cynthia [name] notes that her trip to see her sister in [place] was helpful. The one thing that makes her feel better is spending time with her grandchildren.  She used to take care of Cynthia [name] but now she goes to daycare ("They took her away from me") and this made her feel worse.  Cynthia [name] reports a long history of depression.  She started to feel depressed 10 years ago when she had a kidney transplant.  Two years ago, her depression got worse, when they detected cancer in her breast and was treated with chemo and a double mastectomy.  Cynthia [name] felt "mutilated."  She is still considering breast reconstruction but feels like she need to rest her body after so many surgeries.  She states that a lot has happened that she still hasn't been able to process.  Her sister has been very supportive and has helped motivate her.    Cynthia [name] admits that she was stressed that her house was very cluttered.  For two years she had been going to yard sales buying all manner of things.  She states that it helped her feel better.  It was a "refugio"  and it made her feel "llena."  The more her daughters told her to stop buying and get rid of things, she feels she did it more in rebellion.  She admits that when she realized that her rooms and garage were full of things, she finally stopped buying.  With her permission, while she was in [place] her daughters cleaned her house out.  At first she was stressed to return and have her things thrown out.  But now she is feeling more comfortable and relaxed without all the clutter.  She still has the upstairs room to declutter and discard her excessive acquisitions and is working on it "little by little."  Partner - Al (75) he is planning to retire in December.  He works in a company the [employer].  When he retires he wants to return to [place].  His parents age aging and are not well.  They have been together for 10 years.  He loves her ans has been very supportive.  It makes her feel sad that he is leaving.  He has invited her to go but she doesn't think she can.  If she leaves, she will use her health care and other benefits.  She will also lose her daughters and grandchildren.  Cynthia [name] feels very conflicted.  Cynthia [name] has three daughters and all of them suffer from anxiety.  They have all gone to therapy to address their problems with anxiety and take medication.  Cynthia [name] (1989) - she has one daughter, Cynthia [name] (1y 49m). She will be marrying her partner this fall.  Her partner shares custody of his daughter (88) with his ex-wife [name].  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  She works at night and isn't able to see her daughter much.  Cynthia [name] (31) she is spearated from her husband.  She has a daughter named Cynthia [name] (10).  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  She also have anxiety.  Cynthia [name] ('[birth year]) she lives with her partner Cynthia [name].  They have a 13 month old named Cynthia [name].  They lived with her partner's family.  They  also work at the same [employer] works at.  She will receive her degree in accounting in December.    Mental Status Exam: Appearance:   Casual     Behavior:  Appropriate  Motor:  Normal  Speech/Language:   NA  Affect:  Appropriate, Depressed, and Tearful  Mood:  normal  Thought process:  normal  Thought content:    WNL  Sensory/Perceptual disturbances:    WNL  Orientation:  oriented to person, place, time/date, and situation  Attention:  Good  Concentration:  Good  Memory:  WNL  Fund of knowledge:   Good  Insight:    Good  Judgment:   Good  Impulse Control:  Fair    Barriers:  Requires a Spanish speaking therapist.  All therapy appointment will be conducted in Spanish with a bilingual therapist  Individualized Treatment Plan      Strengths: has some insight into her maladaptive coping mechanisms and their role in the problems she is struggling with  Supports: Supportive Relationships, Family, Friends, Church, and Spirituality   Goal/Needs for Treatment:  In order of importance to patient 1) Learn and implement skills and strategies for managing and prevent relapse depression 2) Learn and implement skills and strategies for managing family conflict and stressors 3) Learn and implement skills and strategies for managing hoarding and discarding what she's accumulated 4) Provide the support and guidance require for managing a new life transition and fears related to being alone   Client Statement of Needs: wants to feel less overwhelmed with all the stress in her life or by the pressures she is feeling from her family.   Treatment Level: Weekly Outpatient Psychotherapy  Symptoms: feels depressed, overwhelmed and hopeless, expr. high family conflict, is attempting to control hoarding & has ceased acquiring new items.    Client Treatment Preferences: Requires a Spanish speaking therapist.   Healthcare consumer's goal for treatment:  Psychologist, [name], Ph.D.  will support the patient's ability to achieve the goals identified. Cognitive Behavioral Therapy, Dialectical Behavioral Therapy, Motivational Interviewing, Motivational Interviewing and other evidenced-based practices will be used to promote progress towards healthy functioning.   Healthcare consumer Cynthia [name] will: Actively participate in therapy, working towards healthy functioning.    *Justification for Continuation/Discontinuation of Goal: R=Revised, O=Ongoing, A=Achieved, D=Discontinued  Goal 1) Learn and implement skills and strategies for managing and prevent relapse depression  Likert rating baseline date: 12/25/2022 Target Date Goal Was reviewed Status Code Progress towards goal/Likert rating  12/25/2023           N              Goal 2) Learn and implement skills and strategies for managing family conflict and stressors  Likert rating baseline date:  12/25/2022  Target Date Goal Was reviewed Status Code Progress towards goal/Likert rating  12/25/2023           N              Goal 3) Learn and implement skills and strategies for managing hoarding and discarding what she's accumulated  Likert rating baseline date: 12/25/2022  Target Date Goal Was reviewed Status Code Progress towards goal/Likert rating  12/25/2023           N              Goal 4) Provide the support and guidance require for managing a new life transition and fears related to being alone  Likert rating baseline date: 12/25/2022  Target Date Goal Was reviewed Status Code Progress towards goal/Likert rating  12/25/2023           N               This plan has been reviewed and created by the following participants:  This plan will be reviewed at least every 12 months. Date Behavioral Health Clinician Date Guardian/Patient   12/25/2022 [name], Ph.D.  12/25/2022 Cynthia [name]                  Diagnoses:  Major Depressive Episode, recurrent, moderate Hoarding Disorder, with good insight    In session  today, we created Cynthia [name]'s treatment plan.  We d/ her needs and set four individual goals.  Distiny actively participated in the creation of her treatment plan and freely gave her consent.  I continued to collect pertinent family history.  When Grand Rivers first separated from husband Megyn Grimes, her girls were 14,10 and 8.  Note - they have never officially divorced.   The only reason she states is that neither wants to pay or be responsible for handling the divorce.  This is when she first started to be afraid of being alone and she first succumbed to depressed.  Hawi raised her three daughters for three years on her own before she got together with her next partner.   She was diagnosed with kidney disease and he refused to live with someone who he had to care for.  She states that the second separation wasn't as hard because there was a lot of conflict with her partner.  He was partial and treated his three daughters differently than her daughters, and this was a big problem.  Marshelle has some insight about getting into relationships out of fear of being alone, and as a result, hasn't always made good decisions.   [name], PhD

## 2023-01-01 ENCOUNTER — Ambulatory Visit: Payer: Medicare HMO | Admitting: Physical Therapy

## 2023-01-01 ENCOUNTER — Encounter (INDEPENDENT_AMBULATORY_CARE_PROVIDER_SITE_OTHER): Payer: Medicare HMO | Admitting: Ophthalmology

## 2023-01-01 NOTE — Therapy (Deleted)
OUTPATIENT PHYSICAL THERAPY FEMALE PELVIC TREATMENT   Patient Name: Cynthia [name] MRN: [id number] DOB:08/30/1970, 52 y.o., female Today's Date: 01/01/23   END OF SESSION:         Past Medical History:  Diagnosis Date   Allergy    pollen   Anemia    Anxiety    BRCA2 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 03/01/2021   Breast cancer [facility])    Cataract    surgical repair bilateral   Complication of anesthesia    not herself for 2 days after last surgery (brest lumpectomy)   Coronary artery disease    Depression    ESRD on hemodialysis (HCC)    Home HD 5x per week- not on dialysis now had tramsplant 4/17   Family history of ovarian cancer 02/22/2021   GERD (gastroesophageal reflux disease)    Hearing loss 2017   right ear   History of blood transfusion    transfusion reaction   Hyperlipidemia    Hypertension    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 per pt-dr levy also II; Per patient 08/18/21 she is Type 2   Kidney transplant recipient    Leukocytosis 06/13/2021   Neuromuscular disorder (HCC)    NEUROPATHY   Sleep apnea    Past Surgical History:  Procedure Laterality Date   ABDOMINAL HYSTERECTOMY     BREAST EXCISIONAL BIOPSY Right 08/2015   BREAST LUMPECTOMY WITH RADIOACTIVE SEED LOCALIZATION Right 09/14/2015   Procedure: RIGHT BREAST LUMPECTOMY WITH RADIOACTIVE SEED LOCALIZATION;  Surgeon: Manus Rudd, MD;  Location: [facility];  Service: General;  Laterality: Right;   BREAST LUMPECTOMY WITH RADIOACTIVE SEED LOCALIZATION Left 03/02/2021   Procedure: LEFT BREAST SEED LUMPECTOMY LEFT SENTINEL LYMPH NODE MAPPING;  Surgeon: Harriette Bouillon, MD;  Location: [facility];  Service: General;  Laterality: Left;  GEN AND PEC BLOCK   BREAST SURGERY Bilateral    biopsy bilateral   CARDIAC CATHETERIZATION     CATARACT EXTRACTION Bilateral    bilateral   CHOLECYSTECTOMY     CYST REMOVAL NECK      DIALYSIS FISTULA CREATION Left    EYE SURGERY Bilateral    lazer   kidney transplant     LEFT HEART CATH AND CORONARY ANGIOGRAPHY N/A 03/04/2019   Procedure: LEFT HEART CATH AND CORONARY ANGIOGRAPHY;  Surgeon: Swaziland, Peter M, MD;  Location: [facility];  Service: Cardiovascular;  Laterality: N/A;   LEFT HEART CATHETERIZATION WITH CORONARY ANGIOGRAM N/A 03/30/2014   Procedure: LEFT HEART CATHETERIZATION WITH CORONARY ANGIOGRAM;  Surgeon: Lesleigh Noe, MD;  Location: [facility] CATH LAB;  Service: Cardiovascular;  Laterality: N/A;   LIPOMA EXCISION N/A 02/06/2017   Procedure: EXCISION POSTERIOR NECK SEBACEOUS CYST;  Surgeon: Abigail Miyamoto, MD;  Location: [facility] OR;  Service: General;  Laterality: N/A;   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   Procedure: REPAIR OF ARTERIOVENOUS FISTULA ANEURYSM;  Surgeon: Nada Libman, MD;  Location: [facility] OR;  Service: Vascular;  Laterality: Left;   REVISON OF ARTERIOVENOUS FISTULA Left 09/28/2013   Procedure: EXCISE ESCHAR LEFT ARM  ARTERIOVENOUS FISTULA WITH PLICATION OF LEFT ARM ARTERIOVENOUS FISTULA;  Surgeon: Sherren Kerns, MD;  Location: [facility] OR;  Service: Vascular;  Laterality: Left;   REVISON OF ARTERIOVENOUS FISTULA Left 08/26/2015   Procedure: RESECTION ANEURYSM OF LEFT ARM ARTERIOVENOUS FISTULA  ;  Surgeon: Nada Libman, MD;  Location: [facility] OR;  Service: Vascular;  Laterality: Left;  ROBOTIC ASSISTED BILATERAL SALPINGO OOPHERECTOMY N/A 07/11/2022   Procedure: XI ROBOTIC ASSISTED BILATERAL SALPINGO OOPHORECTOMY;  Surgeon: Carver Fila, MD;  Location: [facility]S;  Service: Gynecology;  Laterality: N/A;   SIMPLE MASTECTOMY WITH AXILLARY SENTINEL NODE BIOPSY Bilateral 08/22/2021   Procedure: BILATERAL SIMPLE MASTECTOMY;  Surgeon: Harriette Bouillon, MD;  Location: [facility] OR;  Service: General;  Laterality: Bilateral;   TUBAL LIGATION     Patient Active Problem List   Diagnosis Date Noted   Gastroesophageal reflux disease  without esophagitis 07/28/2022   AKI (acute kidney injury/renal transplant on immunosuppresants  08/24/2021   BRCA2 gene mutation positive s/p bilateral masectomy  08/22/2021   PICC (peripherally inserted central catheter) in place 06/13/2021   Stage 3a chronic kidney disease (CKD) (HCC) 05/28/2021   Thrombocytopenia (HCC) 05/28/2021   Aortic atherosclerosis (HCC) 03/07/2021   Hypercholesterolemia 03/07/2021   Family history of ovarian cancer 02/22/2021   Malignant neoplasm of lower-inner quadrant of left breast in female, estrogen receptor positive (HCC) 02/15/2021   Foot pain, bilateral 07/28/2020   Insulin dependent type 2 diabetes mellitus (HCC) 06/06/2019   DKA (diabetic ketoacidoses) 06/02/2019   HTN (hypertension) 03/04/2019   Abnormal nuclear stress test 03/04/2019   Diabetic nephropathy (HCC) 03/03/2019   History of renal transplant 11/18/2015   Diabetic retinopathy associated with type 2 diabetes mellitus (HCC) 04/13/2015   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   Chronic constipation 07/28/2013   Paresthesias 07/28/2013   Diabetes mellitus type I [facility])    Kidney transplant status 05/28/2013   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/11/2013   Precordial chest pain 02/10/2013   Awaiting organ transplant 09/19/2012    PCP: Loura Back   REFERRING PROVIDER: Crist Fat, MD   REFERRING DIAG:  N39.41 (ICD-10-CM) - Urge incontinence  R39.15 (ICD-10-CM) - Urgency of urination    THERAPY DIAG:  No diagnosis found.  Rationale for Evaluation and Treatment: Rehabilitation  ONSET DATE: 1 year  SUBJECTIVE:                                                                                                                                                                                           SUBJECTIVE STATEMENT: Pt states back and down the right leg has been bothering me every day. Fluid intake: Yes: 3-4 bottles    PAIN:   PAIN:  Are you having pain? Yes NPRS scale: 5/10 Pain location: back  Pain orientation: Bilateral  PAIN TYPE: aching, radiating Pain description: intermittent and constant  Aggravating factors: nothing that I can tell in general but leaning forward to do things and then coming back upright is  very painful Relieving factors: tylenol, sitting   PRECAUTIONS: None  WEIGHT BEARING RESTRICTIONS: No  FALLS:  Has patient fallen in last 6 months? No  LIVING ENVIRONMENT: Lives with:  partner, daughter, granddaughter Lives in: House/apartment   OCCUPATION: no  PLOF: Independent  PATIENT GOALS: not have leakage and have more time to get to bathroom  PERTINENT HISTORY:  Abdominal hysterectomy, breast cancer 202 Sexual abuse: No  BOWEL MOVEMENT: Pain with bowel movement: Yes Type of bowel movement:Type (Bristol Stool Scale) hard, Frequency every day, and Strain Yes Fully empty rectum: No Leakage: No Pads: No Fiber supplement:   URINATION: Pain with urination: No Fully empty bladder: No leaks after going Stream: Strong Urgency: Yes: but can hold it Frequency: 3 hours Leakage: Urge to void, Walking to the bathroom, Coughing, and Sneezing Pads: Yes: 2/day  INTERCOURSE: Pain with intercourse: not sexually active d/t erectile disfunction   PREGNANCY: Vaginal deliveries 3   PROLAPSE: None   OBJECTIVE:   DIAGNOSTIC FINDINGS:    PATIENT SURVEYS:    PFIQ-7 = 33  COGNITION: Overall cognitive status: Within functional limits for tasks assessed     SENSATION: Light touch: Appears intact Proprioception: Appears intact  MUSCLE LENGTH: Hamstrings: Right 60 deg; Left 70 deg Thomas test:   LUMBAR SPECIAL TESTS:  Straight leg raise test: Positive Rt hip and low back with Rt PSLR  FUNCTIONAL TESTS:  Single leg stand unsteady bil and pain on right side with <2 sec hold  GAIT:  Comments: short step length and slow cadence  POSTURE: weight shift  right  PELVIC ALIGNMENT:   LUMBARAROM/PROM:  A/PROM A/PROM  eval  Flexion 75% +pain coming back to standing  Extension   Right lateral flexion 90%  Left lateral flexion 75%+pain  Right rotation Pain on Rt  Left rotation WFL    (Blank rows = not tested)  LOWER EXTREMITY ROM:  Passive ROM Right eval Left eval  Hip flexion 75% 90%  Hip extension    Hip abduction    Hip adduction    Hip internal rotation 80% 90%  Hip external rotation 75% 80%  Knee flexion    Knee extension    Ankle dorsiflexion    Ankle plantarflexion    Ankle inversion    Ankle eversion     (Blank rows = not tested)  LOWER EXTREMITY MMT:  MMT Right eval Left eval  Hip flexion    Hip extension    Hip abduction 4/5 4/5  Hip adduction 4/5 4/5  Hip internal rotation    Hip external rotation    Knee flexion    Knee extension    Ankle dorsiflexion    Ankle plantarflexion    Ankle inversion    Ankle eversion     PALPATION:   General  lumbar and gluteals tight; Rt SI and gluteals tender to palpation, hamstirngs and adductors tight Rt>Lt                External Perineal Exam can do kegel with gross assessment outside clothing                             Internal Pelvic Floor   Patient confirms identification and approves PT to assess internal pelvic floor and treatment Yes  PELVIC MMT:   MMT eval  Vaginal 3/5 x 5 reps; decreased hold after 4 sec but not able to fully relax after hold  Internal Anal Sphincter   External  Anal Sphincter   Puborectalis   Diastasis Recti   (Blank rows = not tested)        TONE: na  PROLAPSE: na  TODAY'S TREATMENT:                                                                                                                              DATE: 12/25/22   Manual: Lumbar and gluteal STM and trigger point Trigger Point Dry-Needling  Treatment instructions: Expect mild to moderate muscle soreness. S/S of pneumothorax if dry needled over a lung field, and  to seek immediate medical attention should they occur. Patient verbalized understanding of these instructions and education.  Patient Consent Given: Yes Education handout provided: Yes Muscles treated: lumbar multifidi, bil gluteals (tender) Electrical stimulation performed: No Parameters: N/A Treatment response/outcome: increased soft tissue length  Patient confirms identification and approves physical therapist to perform internal soft tissue work   - Rt side levators and ileococcygeus and obturator - breathing and bulging pelvic floor  Exercises: Down dog and cal stretch along with breathing and bulging pelvic floor   PATIENT EDUCATION:  Education details: toileting techniques, deep breathing, Access Code: [email or web address] Person educated: Patient Education method: Explanation Education comprehension: verbalized understanding and needs further education  HOME EXERCISE PROGRAM: Access Code: [email or web address]    ASSESSMENT:  CLINICAL IMPRESSION: Pt was re-assessed for pelvic floor strength and coordination.  Pt is very tight and needed cues to bulge with inhale.  Pt has tension and pain on Rt side into ileococcygeus.  Pt had twitch response with dry needling.  Pt was given two stretches and breathing and bulging to work on at home and will benefit from more consistent visits.  Pt had setback since missing the past two months of PT.  Pt will benefit from skilled PT to continue to address improved bowel and bladder function.  OBJECTIVE IMPAIRMENTS: decreased activity tolerance, decreased coordination, decreased endurance, decreased ROM, decreased strength, increased muscle spasms, impaired flexibility, impaired tone, postural dysfunction, and pain.   ACTIVITY LIMITATIONS: continence and toileting  PARTICIPATION LIMITATIONS: cleaning, interpersonal relationship, and community activity  PERSONAL FACTORS: Age and 3+ comorbidities: Abdominal hysterectomy, breast cancer 2022, 3 vaginal deliveries  are  also affecting patient's functional outcome.   REHAB POTENTIAL: Excellent  CLINICAL DECISION MAKING: Evolving/moderate complexity  EVALUATION COMPLEXITY: Moderate   GOALS: Goals reviewed with patient? Yes  SHORT TERM GOALS: Target date: 10/08/22  Ind with how to correctly do a kegel and able to relax and bulge Baseline: Goal status: MET  2.  Ind with healthy bladder and toileting habits Baseline:  Goal status: MET   LONG TERM GOALS: Target date: 02/01/23  Pt will be independent with advanced HEP to maintain improvements made throughout therapy  Baseline:  Goal status: IN PROGRESS  2.  Pt will report 80% reduction of pain due to improvements in posture, strength, and muscle length  Baseline:  Goal status: IN PROGRESS  3.  Pt will  have 80% reduced leakage during a typical day  Baseline: same Goal status: IN PROGRESS  4.  Pt will have to use 1 liner or pads per day  Baseline: 3-4/day  Goal status: IN PROGRESS  5.  Pt will be able to void without leakage afterwards due to improved pelvic floor coordination  Baseline: mostly sneezing, laughing, walking fast Goal status: IN PROGRESS    PLAN:  PT FREQUENCY: 1-2x/week  PT DURATION: 12 weeks  PLANNED INTERVENTIONS: Therapeutic exercises, Therapeutic activity, Neuromuscular re-education, Balance training, Gait training, Patient/Family education, Self Care, Joint mobilization, Dry Needling, Electrical stimulation, Cryotherapy, Moist heat, Taping, Traction, Biofeedback, Manual therapy, and Re-evaluation  PLAN FOR NEXT SESSION: do dry needling #6 lumbar and gluteals as needed, discuss internal soft tissue work and work on bulge, possibly RUSI to work on improved pelvic floor bulging   [employer], PT 01/01/2023, 3:00 PM

## 2023-01-02 ENCOUNTER — Ambulatory Visit: Payer: Medicare HMO | Admitting: Physical Therapy

## 2023-01-03 ENCOUNTER — Ambulatory Visit: Payer: Medicare HMO | Admitting: Psychology

## 2023-01-03 DIAGNOSIS — F331 Major depressive disorder, recurrent, moderate: Secondary | ICD-10-CM

## 2023-01-03 NOTE — Progress Notes (Signed)
PROGRESS NOTE:  Name: Cynthia [name] Date: 01/03/2023 MRN: [id number] DOB: 10/28/1970 PCP: Loura Back, NP  Time spent: 1:08 PM - 2:04PM   Today I met with Cynthia [name] for in office face-to-face individual psychotherapy.   Reason for Visit /Presenting Problem: Cynthia [name] is a 52 year old Hispanic female.  She is disabled and cohabitates with her partner of 10 years..  She has three daughters who are not supportive of her significant other.  Her daughters don't accept him and it makes her "feel really bad."  They are also different with her and have become more distant.  They don't communicate with her "like they should."  Cynthia [name] notes that her trip to see her sister in [place] was helpful. The one thing that makes her feel better is spending time with her grandchildren.  She used to take care of Cynthia [name] but now she goes to daycare ("They took her away from me") and this made her feel worse.  Cynthia [name] reports a long history of depression.  She started to feel depressed 10 years ago when she had a kidney transplant.  Two years ago, her depression got worse, when they detected cancer in her breast and was treated with chemo and a double mastectomy.  Cynthia [name] felt "mutilated."  She is still considering breast reconstruction but feels like she need to rest her body after so many surgeries.  She states that a lot has happened that she still hasn't been able to process.  Her sister has been very supportive and has helped motivate her.    Cynthia [name] admits that she was stressed that her house was very cluttered.  For two years she had been going to yard sales buying all manner of things.  She states that it helped her feel better.  It was a "refugio" and it made her feel "llena."  The more her daughters told her to stop buying and get rid of things, she feels she did it more in rebellion.  She admits that when she realized that her rooms and garage were full of things, she finally stopped buying.   With her permission, while she was in [place] her daughters cleaned her house out.  At first she was stressed to return and have her things thrown out.  But now she is feeling more comfortable and relaxed without all the clutter.  She still has the upstairs room to declutter and discard her excessive acquisitions and is working on it "little by little."  Partner - Cynthia [name] (75) he is planning to retire in December.  He works in a company the [employer].  When he retires he wants to return to [place].  His parents age aging and are not well.  They have been together for 10 years.  He loves her ans has been very supportive.  It makes her feel sad that he is leaving.  He has invited her to go but she doesn't think she can.  If she leaves, she will use her health care and other benefits.  She will also lose her daughters and grandchildren.  [name] feels very conflicted.  Cynthia [name] has three daughters and all of them suffer from anxiety.  They have all gone to therapy to address their problems with anxiety and take medication.  Cynthia [name] (1989) - she has one daughter, Cynthia [name] (1y 69m). She will be marrying her partner this fall.  Her partner shares custody of his daughter (91) with his ex-wife Cynthia [name].  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  She works at night and isn't able to see her daughter much.  Cynthia [name] (31) she is spearated from her husband.  She has a daughter named Cynthia [name] (10).  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  She also have anxiety.  Cynthia [name] ('89) she lives with her partner Cynthia [name].  They have a 21 month old named Cynthia [name].  They lived with her partner's family.  They also work at the same [employer] works at.  She will receive her degree in accounting in December.    Mental Status Exam: Appearance:   Casual     Behavior:  Appropriate  Motor:  Normal  Speech/Language:   NA  Affect:  Appropriate,  Depressed, and Tearful  Mood:  normal  Thought process:  normal  Thought content:    WNL  Sensory/Perceptual disturbances:    WNL  Orientation:  oriented to person, place, time/date, and situation  Attention:  Good  Concentration:  Good  Memory:  WNL  Fund of knowledge:   Good  Insight:    Good  Judgment:   Good  Impulse Control:  Fair    Barriers:  Requires a Spanish speaking therapist.  All therapy appointment will be conducted in Spanish with a bilingual therapist  Individualized Treatment Plan      Strengths: has some insight into her maladaptive coping mechanisms and their role in the problems she is struggling with  Supports: Supportive Relationships, Family, Friends, Church, and Spirituality   Goal/Needs for Treatment:  In order of importance to patient 1) Learn and implement skills and strategies for managing and prevent relapse depression 2) Learn and implement skills and strategies for managing family conflict and stressors 3) Learn and implement skills and strategies for managing hoarding and discarding what she's accumulated 4) Provide the support and guidance require for managing a new life transition and fears related to being alone   Client Statement of Needs: wants to feel less overwhelmed with all the stress in her life or by the pressures she is feeling from her family.   Treatment Level: Weekly Outpatient Psychotherapy  Symptoms: feels depressed, overwhelmed and hopeless, expr. high family conflict, is attempting to control hoarding & has ceased acquiring new items.    Client Treatment Preferences: Requires a Spanish speaking therapist.   Healthcare consumer's goal for treatment:  Psychologist, [name], Ph.D. will support the patient's ability to achieve the goals identified. Cognitive Behavioral Therapy, Dialectical Behavioral Therapy, Motivational Interviewing, Motivational Interviewing and other evidenced-based practices will be used to promote  progress towards healthy functioning.   Healthcare consumer [name] will: Actively participate in therapy, working towards healthy functioning.    *Justification for Continuation/Discontinuation of Goal: R=Revised, O=Ongoing, A=Achieved, D=Discontinued  Goal 1) Learn and implement skills and strategies for managing and prevent relapse depression  Likert rating baseline date: 12/25/2022 Target Date Goal Was reviewed Status Code Progress towards goal/Likert rating  12/25/2023           N              Goal 2) Learn and implement skills and strategies for managing family conflict and stressors  Likert rating baseline date: 12/25/2022  Target Date Goal Was reviewed Status Code Progress towards goal/Likert rating  12/25/2023           N  Goal 3) Learn and implement skills and strategies for managing hoarding and discarding what she's accumulated  Likert rating baseline date: 12/25/2022  Target Date Goal Was reviewed Status Code Progress towards goal/Likert rating  12/25/2023           N              Goal 4) Provide the support and guidance require for managing a new life transition and fears related to being alone  Likert rating baseline date: 12/25/2022  Target Date Goal Was reviewed Status Code Progress towards goal/Likert rating  12/25/2023           N               This plan has been reviewed and created by the following participants:  This plan will be reviewed at least every 12 months. Date Behavioral Health Clinician Date Guardian/Patient   12/25/2022 [name], Ph.D.  12/25/2022 Cynthia [name]                  Diagnoses:  Major Depressive Episode, recurrent, moderate Hoarding Disorder, with good insight     Cynthia [name] reports that she has been staying with her daughter to babysit her grandchildren while her daughter's in-laws travel.  She was clearly pleased to be able to spend time with her grandchild.  She stated that she was concerned about increasing her  medication dose because she might "get addicted."  I provided psycho education about SSRI medications, answered her questions and addressed her anxieties.  I then did a mini assessment of her depression and she admitted to sleep disturbance (initial and continuous), poor appetite, low motivation and fatigue.  Now that she better understood how SSRIs work and that she endorsed several depressive symptoms, she agreed to speak to her PCP about increasing her dose.  Lastly, we d/ good sleep hygiene and where she needed to make adjustments.    [name], PhD

## 2023-01-07 NOTE — Therapy (Deleted)
OUTPATIENT PHYSICAL THERAPY FEMALE PELVIC TREATMENT   Patient Name: Cynthia [name] MRN: [id number] DOB:Sep 07, 1970, 52 y.o., female Today's Date: 01/07/23   END OF SESSION:         Past Medical History:  Diagnosis Date   Allergy    pollen   Anemia    Anxiety    BRCA2 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 03/01/2021   Breast cancer [facility])    Cataract    surgical repair bilateral   Complication of anesthesia    not herself for 2 days after last surgery (brest lumpectomy)   Coronary artery disease    Depression    ESRD on hemodialysis (HCC)    Home HD 5x per week- not on dialysis now had tramsplant 4/17   Family history of ovarian cancer 02/22/2021   GERD (gastroesophageal reflux disease)    Hearing loss 2017   right ear   History of blood transfusion    transfusion reaction   Hyperlipidemia    Hypertension    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 per pt-dr levy also II; Per patient 08/18/21 she is Type 2   Kidney transplant recipient    Leukocytosis 06/13/2021   Neuromuscular disorder (HCC)    NEUROPATHY   Sleep apnea    Past Surgical History:  Procedure Laterality Date   ABDOMINAL HYSTERECTOMY     BREAST EXCISIONAL BIOPSY Right 08/2015   BREAST LUMPECTOMY WITH RADIOACTIVE SEED LOCALIZATION Right 09/14/2015   Procedure: RIGHT BREAST LUMPECTOMY WITH RADIOACTIVE SEED LOCALIZATION;  Surgeon: Manus Rudd, MD;  Location: [facility];  Service: General;  Laterality: Right;   BREAST LUMPECTOMY WITH RADIOACTIVE SEED LOCALIZATION Left 03/02/2021   Procedure: LEFT BREAST SEED LUMPECTOMY LEFT SENTINEL LYMPH NODE MAPPING;  Surgeon: Harriette Bouillon, MD;  Location: [facility];  Service: General;  Laterality: Left;  GEN AND PEC BLOCK   BREAST SURGERY Bilateral    biopsy bilateral   CARDIAC CATHETERIZATION     CATARACT EXTRACTION Bilateral    bilateral   CHOLECYSTECTOMY     CYST REMOVAL NECK      DIALYSIS FISTULA CREATION Left    EYE SURGERY Bilateral    lazer   kidney transplant     LEFT HEART CATH AND CORONARY ANGIOGRAPHY N/A 03/04/2019   Procedure: LEFT HEART CATH AND CORONARY ANGIOGRAPHY;  Surgeon: Swaziland, Peter M, MD;  Location: [facility];  Service: Cardiovascular;  Laterality: N/A;   LEFT HEART CATHETERIZATION WITH CORONARY ANGIOGRAM N/A 03/30/2014   Procedure: LEFT HEART CATHETERIZATION WITH CORONARY ANGIOGRAM;  Surgeon: Lesleigh Noe, MD;  Location: [facility] CATH LAB;  Service: Cardiovascular;  Laterality: N/A;   LIPOMA EXCISION N/A 02/06/2017   Procedure: EXCISION POSTERIOR NECK SEBACEOUS CYST;  Surgeon: Abigail Miyamoto, MD;  Location: [facility] OR;  Service: General;  Laterality: N/A;   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   Procedure: REPAIR OF ARTERIOVENOUS FISTULA ANEURYSM;  Surgeon: Nada Libman, MD;  Location: [facility] OR;  Service: Vascular;  Laterality: Left;   REVISON OF ARTERIOVENOUS FISTULA Left 09/28/2013   Procedure: EXCISE ESCHAR LEFT ARM  ARTERIOVENOUS FISTULA WITH PLICATION OF LEFT ARM ARTERIOVENOUS FISTULA;  Surgeon: Sherren Kerns, MD;  Location: [facility] OR;  Service: Vascular;  Laterality: Left;   REVISON OF ARTERIOVENOUS FISTULA Left 08/26/2015   Procedure: RESECTION ANEURYSM OF LEFT ARM ARTERIOVENOUS FISTULA  ;  Surgeon: Nada Libman, MD;  Location: [facility];  Service: Vascular;  Laterality: Left;  ROBOTIC ASSISTED BILATERAL SALPINGO OOPHERECTOMY N/A 07/11/2022   Procedure: XI ROBOTIC ASSISTED BILATERAL SALPINGO OOPHORECTOMY;  Surgeon: Carver Fila, MD;  Location: [facility];  Service: Gynecology;  Laterality: N/A;   SIMPLE MASTECTOMY WITH AXILLARY SENTINEL NODE BIOPSY Bilateral 08/22/2021   Procedure: BILATERAL SIMPLE MASTECTOMY;  Surgeon: Harriette Bouillon, MD;  Location: [facility];  Service: General;  Laterality: Bilateral;   TUBAL LIGATION     Patient Active Problem List   Diagnosis Date Noted   Gastroesophageal reflux disease  without esophagitis 07/28/2022   AKI (acute kidney injury/renal transplant on immunosuppresants  08/24/2021   BRCA2 gene mutation positive s/p bilateral masectomy  08/22/2021   PICC (peripherally inserted central catheter) in place 06/13/2021   Stage 3a chronic kidney disease (CKD) (HCC) 05/28/2021   Thrombocytopenia (HCC) 05/28/2021   Aortic atherosclerosis (HCC) 03/07/2021   Hypercholesterolemia 03/07/2021   Family history of ovarian cancer 02/22/2021   Malignant neoplasm of lower-inner quadrant of left breast in female, estrogen receptor positive (HCC) 02/15/2021   Foot pain, bilateral 07/28/2020   Insulin dependent type 2 diabetes mellitus (HCC) 06/06/2019   DKA (diabetic ketoacidoses) 06/02/2019   HTN (hypertension) 03/04/2019   Abnormal nuclear stress test 03/04/2019   Diabetic nephropathy (HCC) 03/03/2019   History of renal transplant 11/18/2015   Diabetic retinopathy associated with type 2 diabetes mellitus (HCC) 04/13/2015   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   Chronic constipation 07/28/2013   Paresthesias 07/28/2013   Diabetes mellitus type I [facility])    Kidney transplant status 05/28/2013   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/11/2013   Precordial chest pain 02/10/2013   Awaiting organ transplant 09/19/2012    PCP: Loura Back   REFERRING PROVIDER: Crist Fat, MD   REFERRING DIAG:  N39.41 (ICD-10-CM) - Urge incontinence  R39.15 (ICD-10-CM) - Urgency of urination    THERAPY DIAG:  No diagnosis found.  Rationale for Evaluation and Treatment: Rehabilitation  ONSET DATE: 1 year  SUBJECTIVE:                                                                                                                                                                                           SUBJECTIVE STATEMENT: Pt states back and down the right leg has been bothering me every day. Fluid intake: Yes: 3-4 bottles    PAIN:   PAIN:  Are you having pain? Yes NPRS scale: 5/10 Pain location: back  Pain orientation: Bilateral  PAIN TYPE: aching, radiating Pain description: intermittent and constant  Aggravating factors: nothing that I can tell in general but leaning forward to do things and then coming back upright is  very painful Relieving factors: tylenol, sitting   PRECAUTIONS: None  WEIGHT BEARING RESTRICTIONS: No  FALLS:  Has patient fallen in last 6 months? No  LIVING ENVIRONMENT: Lives with:  partner, daughter, granddaughter Lives in: House/apartment   OCCUPATION: no  PLOF: Independent  PATIENT GOALS: not have leakage and have more time to get to bathroom  PERTINENT HISTORY:  Abdominal hysterectomy, breast cancer 202 Sexual abuse: No  BOWEL MOVEMENT: Pain with bowel movement: Yes Type of bowel movement:Type (Bristol Stool Scale) hard, Frequency every day, and Strain Yes Fully empty rectum: No Leakage: No Pads: No Fiber supplement:   URINATION: Pain with urination: No Fully empty bladder: No leaks after going Stream: Strong Urgency: Yes: but can hold it Frequency: 3 hours Leakage: Urge to void, Walking to the bathroom, Coughing, and Sneezing Pads: Yes: 2/day  INTERCOURSE: Pain with intercourse: not sexually active d/t erectile disfunction   PREGNANCY: Vaginal deliveries 3   PROLAPSE: None   OBJECTIVE:   DIAGNOSTIC FINDINGS:    PATIENT SURVEYS:    PFIQ-7 = 33  COGNITION: Overall cognitive status: Within functional limits for tasks assessed     SENSATION: Light touch: Appears intact Proprioception: Appears intact  MUSCLE LENGTH: Hamstrings: Right 60 deg; Left 70 deg Thomas test:   LUMBAR SPECIAL TESTS:  Straight leg raise test: Positive Rt hip and low back with Rt PSLR  FUNCTIONAL TESTS:  Single leg stand unsteady bil and pain on right side with <2 sec hold  GAIT:  Comments: short step length and slow cadence  POSTURE: weight shift  right  PELVIC ALIGNMENT:   LUMBARAROM/PROM:  A/PROM A/PROM  eval  Flexion 75% +pain coming back to standing  Extension   Right lateral flexion 90%  Left lateral flexion 75%+pain  Right rotation Pain on Rt  Left rotation WFL    (Blank rows = not tested)  LOWER EXTREMITY ROM:  Passive ROM Right eval Left eval  Hip flexion 75% 90%  Hip extension    Hip abduction    Hip adduction    Hip internal rotation 80% 90%  Hip external rotation 75% 80%  Knee flexion    Knee extension    Ankle dorsiflexion    Ankle plantarflexion    Ankle inversion    Ankle eversion     (Blank rows = not tested)  LOWER EXTREMITY MMT:  MMT Right eval Left eval  Hip flexion    Hip extension    Hip abduction 4/5 4/5  Hip adduction 4/5 4/5  Hip internal rotation    Hip external rotation    Knee flexion    Knee extension    Ankle dorsiflexion    Ankle plantarflexion    Ankle inversion    Ankle eversion     PALPATION:   General  lumbar and gluteals tight; Rt SI and gluteals tender to palpation, hamstirngs and adductors tight Rt>Lt                External Perineal Exam can do kegel with gross assessment outside clothing                             Internal Pelvic Floor   Patient confirms identification and approves PT to assess internal pelvic floor and treatment Yes  PELVIC MMT:   MMT eval  Vaginal 3/5 x 5 reps; decreased hold after 4 sec but not able to fully relax after hold  Internal Anal Sphincter   External  Anal Sphincter   Puborectalis   Diastasis Recti   (Blank rows = not tested)        TONE: na  PROLAPSE: na  TODAY'S TREATMENT:                                                                                                                              DATE: 12/25/22   Manual: Lumbar and gluteal STM and trigger point Trigger Point Dry-Needling  Treatment instructions: Expect mild to moderate muscle soreness. S/S of pneumothorax if dry needled over a lung field, and  to seek immediate medical attention should they occur. Patient verbalized understanding of these instructions and education.  Patient Consent Given: Yes Education handout provided: Yes Muscles treated: lumbar multifidi, bil gluteals (tender) Electrical stimulation performed: No Parameters: N/A Treatment response/outcome: increased soft tissue length  Patient confirms identification and approves physical therapist to perform internal soft tissue work   - Rt side levators and ileococcygeus and obturator - breathing and bulging pelvic floor  Exercises: Down dog and cal stretch along with breathing and bulging pelvic floor   PATIENT EDUCATION:  Education details: toileting techniques, deep breathing, Access Code: [email or web address] Person educated: Patient Education method: Explanation Education comprehension: verbalized understanding and needs further education  HOME EXERCISE PROGRAM: Access Code: [email or web address]    ASSESSMENT:  CLINICAL IMPRESSION: Pt was re-assessed for pelvic floor strength and coordination.  Pt is very tight and needed cues to bulge with inhale.  Pt has tension and pain on Rt side into ileococcygeus.  Pt had twitch response with dry needling.  Pt was given two stretches and breathing and bulging to work on at home and will benefit from more consistent visits.  Pt had setback since missing the past two months of PT.  Pt will benefit from skilled PT to continue to address improved bowel and bladder function.  OBJECTIVE IMPAIRMENTS: decreased activity tolerance, decreased coordination, decreased endurance, decreased ROM, decreased strength, increased muscle spasms, impaired flexibility, impaired tone, postural dysfunction, and pain.   ACTIVITY LIMITATIONS: continence and toileting  PARTICIPATION LIMITATIONS: cleaning, interpersonal relationship, and community activity  PERSONAL FACTORS: Age and 3+ comorbidities: Abdominal hysterectomy, breast cancer 2022, 3 vaginal deliveries  are  also affecting patient's functional outcome.   REHAB POTENTIAL: Excellent  CLINICAL DECISION MAKING: Evolving/moderate complexity  EVALUATION COMPLEXITY: Moderate   GOALS: Goals reviewed with patient? Yes  SHORT TERM GOALS: Target date: 10/08/22  Ind with how to correctly do a kegel and able to relax and bulge Baseline: Goal status: MET  2.  Ind with healthy bladder and toileting habits Baseline:  Goal status: MET   LONG TERM GOALS: Target date: 02/01/23  Pt will be independent with advanced HEP to maintain improvements made throughout therapy  Baseline:  Goal status: IN PROGRESS  2.  Pt will report 80% reduction of pain due to improvements in posture, strength, and muscle length  Baseline:  Goal status: IN PROGRESS  3.  Pt will  have 80% reduced leakage during a typical day  Baseline: same Goal status: IN PROGRESS  4.  Pt will have to use 1 liner or pads per day  Baseline: 3-4/day  Goal status: IN PROGRESS  5.  Pt will be able to void without leakage afterwards due to improved pelvic floor coordination  Baseline: mostly sneezing, laughing, walking fast Goal status: IN PROGRESS    PLAN:  PT FREQUENCY: 1-2x/week  PT DURATION: 12 weeks  PLANNED INTERVENTIONS: Therapeutic exercises, Therapeutic activity, Neuromuscular re-education, Balance training, Gait training, Patient/Family education, Self Care, Joint mobilization, Dry Needling, Electrical stimulation, Cryotherapy, Moist heat, Taping, Traction, Biofeedback, Manual therapy, and Re-evaluation  PLAN FOR NEXT SESSION: do dry needling #6 lumbar and gluteals as needed, discuss internal soft tissue work and work on bulge, possibly RUSI to work on improved pelvic floor bulging   [employer], PT 01/07/2023, 2:20 PM

## 2023-01-08 ENCOUNTER — Ambulatory Visit: Payer: Medicare HMO | Admitting: Physical Therapy

## 2023-01-10 ENCOUNTER — Ambulatory Visit: Payer: Medicare HMO | Admitting: Psychology

## 2023-01-16 ENCOUNTER — Ambulatory Visit (HOSPITAL_BASED_OUTPATIENT_CLINIC_OR_DEPARTMENT_OTHER): Payer: Medicare HMO | Admitting: Cardiology

## 2023-01-17 ENCOUNTER — Ambulatory Visit (INDEPENDENT_AMBULATORY_CARE_PROVIDER_SITE_OTHER): Payer: Medicare HMO | Admitting: Psychology

## 2023-01-17 DIAGNOSIS — F331 Major depressive disorder, recurrent, moderate: Secondary | ICD-10-CM | POA: Diagnosis not present

## 2023-01-17 DIAGNOSIS — F423 Hoarding disorder: Secondary | ICD-10-CM

## 2023-01-17 NOTE — Progress Notes (Signed)
PROGRESS NOTE:  Name: Cynthia [name] Date: 01/17/2023 MRN: [id number] DOB: 03-30-1971 PCP: Loura Back, NP  Time spent: 1:08 PM - 2:10PM   Today I met with  Kayren Eaves in remote video (Careg[facility]) face-to-face individual psychotherapy.   Distance Site: Client's Home Orginating Site: Dr Odette Horns Remote Office Consent: Obtained verbal consent to transmit  session remotely    Reason for Visit /Presenting Problem: Cynthia [name] is a 52 year old Hispanic female.  She is disabled and cohabitates with her partner of 10 years..  She has three daughters who are not supportive of her significant other.  Her daughters don't accept him and it makes her "feel really bad."  They are also different with her and have become more distant.  They don't communicate with her "like they should."  Cynthia [name] notes that her trip to see her sister in [place] was helpful. The one thing that makes her feel better is spending time with her grandchildren.  She used to take care of Cynthia [name] but now she goes to daycare ("They took her away from me") and this made her feel worse.  Donell reports a long history of depression.  She started to feel depressed 10 years ago when she had a kidney transplant.  Two years ago, her depression got worse, when they detected cancer in her breast and was treated with chemo and a double mastectomy.  Cristan felt "mutilated."  She is still considering breast reconstruction but feels like she need to rest her body after so many surgeries.  She states that a lot has happened that she still hasn't been able to process.  Her sister has been very supportive and has helped motivate her.    Cynthia [name] admits that she was stressed that her house was very cluttered.  For two years she had been going to yard sales buying all manner of things.  She states that it helped her feel better.  It was a "refugio" and it made her feel "llena."  The more her daughters told her to stop buying and get rid of  things, she feels she did it more in rebellion.  She admits that when she realized that her rooms and garage were full of things, she finally stopped buying.  With her permission, while she was in [place] her daughters cleaned her house out.  At first she was stressed to return and have her things thrown out.  But now she is feeling more comfortable and relaxed without all the clutter.  She still has the upstairs room to declutter and discard her excessive acquisitions and is working on it "little by little."  Partner - [name] (75) he is planning to retire in December.  He works in a company the [employer].  When he retires he wants to return to [place].  His parents age aging and are not well.  They have been together for 10 years.  He loves her ans has been very supportive.  It makes her feel sad that he is leaving.  He has invited her to go but she doesn't think she can.  If she leaves, she will use her health care and other benefits.  She will also lose her daughters and grandchildren.  [name] feels very conflicted.  Cynthia [name] has three daughters and all of them suffer from anxiety.  They have all gone to therapy to address their problems with anxiety and take medication.  Cynthia [name] (1989) - she has one daughter, Cynthia [name] (1y 67m). She will be  marrying her partner this fall.  Her partner shares custody of his daughter (19) with his ex-wife Cynthia [name].  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  She works at night and isn't able to see her daughter much.  [name] (31) she is spearated from her husband.  She has a daughter named Cynthia [name] (10).  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  She also have anxiety.  Cynthia [name] ('[birth year]) she lives with her partner [name].  They have a 72 month old named Cynthia [name].  They lived with her partner's family.  They also work at the same [employer] works at.  She will receive her degree in accounting in  December.    Mental Status Exam: Appearance:   Casual     Behavior:  Appropriate  Motor:  Normal  Speech/Language:   NA  Affect:  Appropriate, Depressed, and Tearful  Mood:  normal  Thought process:  normal  Thought content:    WNL  Sensory/Perceptual disturbances:    WNL  Orientation:  oriented to person, place, time/date, and situation  Attention:  Good  Concentration:  Good  Memory:  WNL  Fund of knowledge:   Good  Insight:    Good  Judgment:   Good  Impulse Control:  Fair    Barriers:  Requires a Spanish speaking therapist.  All therapy appointment will be conducted in Spanish with a bilingual therapist  Individualized Treatment Plan      Strengths: has some insight into her maladaptive coping mechanisms and their role in the problems she is struggling with  Supports: Supportive Relationships, Family, Friends, Church, and Spirituality   Goal/Needs for Treatment:  In order of importance to patient 1) Learn and implement skills and strategies for managing and prevent relapse depression 2) Learn and implement skills and strategies for managing family conflict and stressors 3) Learn and implement skills and strategies for managing hoarding and discarding what she's accumulated 4) Provide the support and guidance require for managing a new life transition and fears related to being alone   Client Statement of Needs: wants to feel less overwhelmed with all the stress in her life or by the pressures she is feeling from her family.   Treatment Level: Weekly Outpatient Psychotherapy  Symptoms: feels depressed, overwhelmed and hopeless, expr. high family conflict, is attempting to control hoarding & has ceased acquiring new items.    Client Treatment Preferences: Requires a Spanish speaking therapist.   Healthcare consumer's goal for treatment:  Psychologist, [name], Ph.D. will support the patient's ability to achieve the goals identified. Cognitive Behavioral Therapy,  Dialectical Behavioral Therapy, Motivational Interviewing, Motivational Interviewing and other evidenced-based practices will be used to promote progress towards healthy functioning.   Healthcare consumer Aquinnah Devin will: Actively participate in therapy, working towards healthy functioning.    *Justification for Continuation/Discontinuation of Goal: R=Revised, O=Ongoing, A=Achieved, D=Discontinued  Goal 1) Learn and implement skills and strategies for managing and prevent relapse depression  Likert rating baseline date: 12/25/2022 Target Date Goal Was reviewed Status Code Progress towards goal/Likert rating  12/25/2023           N              Goal 2) Learn and implement skills and strategies for managing family conflict and stressors  Likert rating baseline date: 12/25/2022  Target Date Goal Was reviewed Status Code Progress towards goal/Likert rating  12/25/2023           N              Goal 3) Learn and implement skills and strategies for managing hoarding and discarding what she's accumulated  Likert rating baseline date: 12/25/2022  Target Date Goal Was reviewed Status Code Progress towards goal/Likert rating  12/25/2023           N              Goal 4) Provide the support and guidance require for managing a new life transition and fears related to being alone  Likert rating baseline date: 12/25/2022  Target Date Goal Was reviewed Status Code Progress towards goal/Likert rating  12/25/2023           N               This plan has been reviewed and created by the following participants:  This plan will be reviewed at least every 12 months. Date Behavioral Health Clinician Date Guardian/Patient   12/25/2022 [name], Ph.D.  12/25/2022 Kayren Eaves                  Diagnoses:  Major Depressive Episode, recurrent, moderate Hoarding Disorder, with good insight     Reneshia changed her appointment from in-person to virtual at the last minute which caused a delaying  starting our session today.  She reported that she met with Dr. Selena Batten who informed her that she couldn't increase her Lexapro because of her kidneys.  She was told that her A1C was also very high and that it needed to come down.  Sahily wasn't too surprised about her A1C because she hasn't been as careful about what she is eating.  I encouraged her to meet with a nutritionist to help her better understand how and what to eat in which combinations.  I made inquires about her eating routines, provided psycho education around the impact of diet on mental health in addition to physical health and d/e new sustainable routines.   [name], PhD

## 2023-01-24 ENCOUNTER — Ambulatory Visit: Payer: Medicare HMO | Admitting: Psychology

## 2023-01-31 ENCOUNTER — Ambulatory Visit: Payer: Medicare HMO | Admitting: Psychology

## 2023-02-07 ENCOUNTER — Ambulatory Visit (INDEPENDENT_AMBULATORY_CARE_PROVIDER_SITE_OTHER): Payer: Medicare HMO | Admitting: Psychology

## 2023-02-07 DIAGNOSIS — F331 Major depressive disorder, recurrent, moderate: Secondary | ICD-10-CM

## 2023-02-07 NOTE — Progress Notes (Signed)
PROGRESS NOTE:  Name: Cynthia [name] Date: 02/07/2023 MRN: [id number] DOB: September 06, 1970 PCP: Cynthia Back, NP  Time spent: 1:08 PM - 2:10 PM  Today I met with Cynthia [name] for in office face-to-face individual psychotherapy.   Reason for Visit /Presenting Problem: Cynthia [name] is a 52 year old Hispanic female.  She is disabled and cohabitates with her partner of 10 years..  She has three daughters who are not supportive of her significant other.  Her daughters don't accept him and it makes her "feel really bad."  They are also different with her and have become more distant.  They don't communicate with her "like they should."  Cynthia [name] notes that her trip to see her sister in [place] was helpful. The one thing that makes her feel better is spending time with her grandchildren.  She used to take care of Cynthia [name] but now she goes to daycare ("They took her away from me") and this made her feel worse.  Cynthia [name] reports a long history of depression.  She started to feel depressed 10 years ago when she had a kidney transplant.  Two years ago, her depression got worse, when they detected cancer in her breast and was treated with chemo and a double mastectomy.  Cynthia [name] felt "mutilated."  She is still considering breast reconstruction but feels like she need to rest her body after so many surgeries.  She states that a lot has happened that she still hasn't been able to process.  Her sister has been very supportive and has helped motivate her.    Cynthia [name] admits that she was stressed that her house was very cluttered.  For two years she had been going to yard sales buying all manner of things.  She states that it helped her feel better.  It was a "refugio" and it made her feel "llena."  The more her daughters told her to stop buying and get rid of things, she feels she did it more in rebellion.  She admits that when she realized that her rooms and garage were full of things, she finally stopped buying.  With  her permission, while she was in [place] her daughters cleaned her house out.  At first she was stressed to return and have her things thrown out.  But now she is feeling more comfortable and relaxed without all the clutter.  She still has the upstairs room to declutter and discard her excessive acquisitions and is working on it "little by little."  Partner - Cynthia [name] (75) he is planning to retire in December.  He works in a company the [employer].  When he retires he wants to return to [place].  His parents age aging and are not well.  They have been together for 10 years.  He loves her ans has been very supportive.  It makes her feel sad that he is leaving.  He has invited her to go but she doesn't think she can.  If she leaves, she will use her health care and other benefits.  She will also lose her daughters and grandchildren.  [name] feels very conflicted.  Cynthia [name] has three daughters and all of them suffer from anxiety.  They have all gone to therapy to address their problems with anxiety and take medication.  Cynthia [name] (1989) - she has one daughter, Cynthia [name] (1y 68m). She will be marrying her partner this fall.  Her partner shares custody of his daughter (39) with his ex-wife Cynthia [name].  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  She works at night and isn't able to see her daughter much.  Cynthia [name] (31) she is spearated from her husband.  She has a daughter named Cynthia [name] (10).  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  She also have anxiety.  Cynthia [name] ('[birth year]) she lives with her partner Cynthia [name].  They have a 11 month old named Cynthia [name].  They lived with her partner's family.  They also work at the same [employer] works at.  She will receive her degree in accounting in December.    Mental Status Exam: Appearance:   Casual     Behavior:  Appropriate  Motor:  Normal  Speech/Language:   NA  Affect:  Appropriate, Depressed, and  Tearful  Mood:  normal  Thought process:  normal  Thought content:    WNL  Sensory/Perceptual disturbances:    WNL  Orientation:  oriented to person, place, time/date, and situation  Attention:  Good  Concentration:  Good  Memory:  WNL  Fund of knowledge:   Good  Insight:    Good  Judgment:   Good  Impulse Control:  Fair    Barriers:  Requires a Spanish speaking therapist.  All therapy appointment will be conducted in Spanish with a bilingual therapist  Individualized Treatment Plan      Strengths: has some insight into her maladaptive coping mechanisms and their role in the problems she is struggling with  Supports: Supportive Relationships, Family, Friends, Church, and Spirituality   Goal/Needs for Treatment:  In order of importance to patient 1) Learn and implement skills and strategies for managing and prevent relapse depression 2) Learn and implement skills and strategies for managing family conflict and stressors 3) Learn and implement skills and strategies for managing hoarding and discarding what she's accumulated 4) Provide the support and guidance require for managing a new life transition and fears related to being alone   Client Statement of Needs: wants to feel less overwhelmed with all the stress in her life or by the pressures she is feeling from her family.   Treatment Level: Weekly Outpatient Psychotherapy  Symptoms: feels depressed, overwhelmed and hopeless, expr. high family conflict, is attempting to control hoarding & has ceased acquiring new items.    Client Treatment Preferences: Requires a Spanish speaking therapist.   Healthcare consumer's goal for treatment:  Psychologist, Cynthia [name], Ph.D. will support the patient's ability to achieve the goals identified. Cognitive Behavioral Therapy, Dialectical Behavioral Therapy, Motivational Interviewing, Motivational Interviewing and other evidenced-based practices will be used to promote progress towards  healthy functioning.   Healthcare consumer Cynthia [name] will: Actively participate in therapy, working towards healthy functioning.    *Justification for Continuation/Discontinuation of Goal: R=Revised, O=Ongoing, A=Achieved, D=Discontinued  Goal 1) Learn and implement skills and strategies for managing and prevent relapse depression  Likert rating baseline date: 12/25/2022 Target Date Goal Was reviewed Status Code Progress towards goal/Likert rating  12/25/2023           N              Goal 2) Learn and implement skills and strategies for managing family conflict and stressors  Likert rating baseline date: 12/25/2022  Target Date Goal Was reviewed Status Code Progress towards goal/Likert rating  12/25/2023           N  Goal 3) Learn and implement skills and strategies for managing hoarding and discarding what she's accumulated  Likert rating baseline date: 12/25/2022  Target Date Goal Was reviewed Status Code Progress towards goal/Likert rating  12/25/2023           N              Goal 4) Provide the support and guidance require for managing a new life transition and fears related to being alone  Likert rating baseline date: 12/25/2022  Target Date Goal Was reviewed Status Code Progress towards goal/Likert rating  12/25/2023           N               This plan has been reviewed and created by the following participants:  This plan will be reviewed at least every 12 months. Date Behavioral Health Clinician Date Guardian/Patient   12/25/2022 Cynthia [name], Ph.D.  12/25/2022 Cynthia [name]                  Diagnoses:  Major Depressive Episode, recurrent, moderate Hoarding Disorder, with good insight     Shamelle reports that she has been feeling better.  We d/p that her mood has improved and it is likely it has improved watching her grandchildren over the summer break.  She states that she has been taking an online events planning class.  We d/e/p that it has been a  benefit to her mental health to have something engaging, interesting and purposeful to occupy her time/mind.  Did admit that is hasn't been all good.  We d/e/p her negative ruminations about the past and how this brings her mood down.  We addressed some of these negative thoughts/memories, the need for self-forgiveness, and how to use self talk to come [name] for her negative thoughts.   Cynthia Favors, PhD

## 2023-02-14 ENCOUNTER — Ambulatory Visit: Payer: Medicare HMO | Admitting: Psychology

## 2023-02-21 ENCOUNTER — Ambulatory Visit: Payer: Medicare HMO | Admitting: Psychology

## 2023-02-28 ENCOUNTER — Ambulatory Visit: Payer: Medicare HMO | Admitting: Psychology

## 2023-03-07 ENCOUNTER — Ambulatory Visit: Payer: Medicare HMO | Admitting: Psychology

## 2023-03-14 ENCOUNTER — Ambulatory Visit: Payer: Medicare HMO | Admitting: Psychology

## 2023-03-16 ENCOUNTER — Other Ambulatory Visit: Payer: Self-pay | Admitting: Nurse Practitioner

## 2023-03-21 ENCOUNTER — Ambulatory Visit: Payer: Medicare HMO | Admitting: Psychology

## 2023-03-28 ENCOUNTER — Ambulatory Visit: Payer: Medicare HMO | Admitting: Psychology

## 2023-04-04 ENCOUNTER — Ambulatory Visit: Payer: Medicare HMO | Admitting: Psychology

## 2023-04-10 ENCOUNTER — Other Ambulatory Visit: Payer: Self-pay | Admitting: Hematology

## 2023-04-11 ENCOUNTER — Ambulatory Visit: Payer: Medicare HMO | Admitting: Psychology

## 2023-04-11 ENCOUNTER — Encounter (HOSPITAL_BASED_OUTPATIENT_CLINIC_OR_DEPARTMENT_OTHER): Payer: Self-pay | Admitting: Cardiology

## 2023-04-11 ENCOUNTER — Ambulatory Visit (HOSPITAL_BASED_OUTPATIENT_CLINIC_OR_DEPARTMENT_OTHER): Payer: Medicare HMO | Admitting: Cardiology

## 2023-04-11 VITALS — BP 124/62 | HR 83 | Ht 61.0 in | Wt 180.3 lb

## 2023-04-11 DIAGNOSIS — R0789 Other chest pain: Secondary | ICD-10-CM | POA: Diagnosis not present

## 2023-04-11 DIAGNOSIS — E1029 Type 1 diabetes mellitus with other diabetic kidney complication: Secondary | ICD-10-CM | POA: Diagnosis not present

## 2023-04-11 DIAGNOSIS — Z940 Kidney transplant status: Secondary | ICD-10-CM | POA: Diagnosis not present

## 2023-04-11 DIAGNOSIS — I340 Nonrheumatic mitral (valve) insufficiency: Secondary | ICD-10-CM

## 2023-04-11 DIAGNOSIS — I10 Essential (primary) hypertension: Secondary | ICD-10-CM

## 2023-04-11 DIAGNOSIS — E7800 Pure hypercholesterolemia, unspecified: Secondary | ICD-10-CM

## 2023-04-11 NOTE — Progress Notes (Signed)
Cardiology Office Note:  .    Date:  04/11/2023  ID:  Kayren Eaves, DOB [birth year], MRN [id number] PCP: Loura Back, NP  [facility] Providers Cardiologist:  Jodelle Red, MD     History of Present Illness: .    Cynthia [name] is a 52 y.o. female with a hx of left breast cancer, nonobstructive CAD, type 1 diabetes (diagnosed 1993), hypertension, hyperlipidemia, history of ESRD s/p kidney transplant 2017 who is seen for follow-up. She was initially seen as a new consult on 03/01/21 at the request of Loura Back, NP for the evaluation and management of mitral regurgitation and edema.   At her visit 02/2022 she was feeling good. Home blood pressures were averaging 125 systolic, sometimes with low readings of 55-70 systolic associated with dizziness and visual disturbances. Continued on carvedilol.   Today, she states she is feeling tired. She has also been feeling stressed as she has relatives at her home. Her daughter was married this Saturday.  She complains of central chest pain that has been occurring about 2-3 times per month. It is actively occurring as she is sitting down in a chair. She would describe this as mostly feeling like a burning sensation. Always localized to the same area. No known patterns or correlations. Has occurred in the morning or at night, random times. Typically lasts for 20 minutes, comes and goes intermittently during that time. Her chest pain has been associated with palpable soreness, diaphoresis, nausea, and shortness of breath.   Additionally she complains of severe back pain, and frequent muscle cramps in her legs (particularly calves) and hands.   She denies any palpitations, peripheral edema, lightheadedness, headaches, syncope, orthopnea, or PND.  ROS:  Please see the history of present illness. ROS otherwise negative except as noted.  (+) Central chest pain (+) Stress (+) Diaphoresis (+) Nausea (+) Shortness of breath (+) Severe back  pain (+) Muscle cramps of bilateral LE and hands  Studies Reviewed: Marland Kitchen         Physical Exam:    VS:  BP 124/62   Pulse 83   Ht 5\' 1"  (1.549 m)   Wt 180 lb 4.8 oz (81.8 kg)   SpO2 98%   BMI 34.07 kg/m    Wt Readings from Last 3 Encounters:  04/11/23 180 lb 4.8 oz (81.8 kg)  09/20/22 174 lb 14.4 oz (79.3 kg)  08/23/22 171 lb 3.2 oz (77.7 kg)    GEN: Well nourished, well developed in no acute distress HEENT: Normal, moist mucous membranes NECK: No JVD CARDIAC: regular rhythm, normal S1 and S2, no rubs or gallops. No murmur. VASCULAR: Radial and DP pulses 2+ bilaterally. No carotid bruits RESPIRATORY:  Clear to auscultation without rales, wheezing or rhonchi  ABDOMEN: Soft, non-tender, non-distended MUSCULOSKELETAL:  Ambulates independently. Palpable muscle knot of central chest with reproducible tenderness. SKIN: Warm and dry, no edema NEUROLOGIC:  Alert and oriented x 3. No focal neuro deficits noted. PSYCHIATRIC:  Normal affect   ASSESSMENT AND PLAN: .    Chest pain: -tender on exam, suggests MSK etiology -discussed trying heating pad -reviewed red flag warning signs that need immediate medical attention  mitral regurgitation -had echo, stress test done with Dr. Hanley Hays in [facility].  -no MR murmur appreciated today -appears euvolemic today   Type I diabetes, with kidney complications, now s/p kidney transplant -follows with Dr. Marisue Humble at [facility]   Hyperlipidemia with diabetes Aortic atherosclerosis noted on CT 06/01/2019 -on aspirin  81 mg, rosuvastatin -she is on mounjaro for diabetes, with benefit of potential weight loss   Hypertension: at goal today -continue carvedilol. If hypotension at home occurs more frequently, may need to decrease carvedilol. Suspect she may have some autonomic dysfunction as well given long history of type I diabetes. -furosemide is PRN   Cardiac risk counseling and prevention  recommendations: -recommend heart healthy/Mediterranean diet, with whole grains, fruits, vegetable, fish, lean meats, nuts, and olive oil. Limit salt. -recommend moderate walking, 3-5 times/week for 30-50 minutes each session. Aim for at least 150 minutes.week. Goal should be pace of 3 miles/hours, or walking 1.5 miles in 30 minutes -recommend avoidance of tobacco products. Avoid excess alcohol. -ASCVD risk score: The 10-year ASCVD risk score (Arnett DK, et al., 2019) is: 4.5%   Values used to calculate the score:     Age: 39 years     Sex: Female     Is Non-Hispanic African American: No     Diabetic: Yes     Tobacco smoker: No     Systolic Blood Pressure: 124 mmHg     Is BP treated: Yes     HDL Cholesterol: 67 mg/dL     Total Cholesterol: 248 mg/dL   Dispo: Follow-up in 1 year, or sooner as needed.  I,Mathew Stumpf,acting as a Neurosurgeon for [employer], MD.,have documented all relevant documentation on the behalf of Jodelle Red, MD,as directed by  Jodelle Red, MD while in the presence of Jodelle Red, MD.  I, Jodelle Red, MD, have reviewed all documentation for this visit. The documentation on 04/11/23 for the exam, diagnosis, procedures, and orders are all accurate and complete.   Signed, Jodelle Red, MD

## 2023-04-11 NOTE — Patient Instructions (Signed)
Medication Instructions:  Your physician recommends that you continue on your current medications as directed. Please refer to the Current Medication list given to you today.  *If you need a refill on your cardiac medications before your next appointment, please call your pharmacy*  Lab Work: NONE  Testing/Procedures: NONE  Follow-Up: At [facility], you and your health needs are our priority.  As part of our continuing mission to provide you with exceptional heart care, we have created designated Provider Care Teams.  These Care Teams include your primary Cardiologist (physician) and Advanced Practice Providers (APPs -  Physician Assistants and Nurse Practitioners) who all work together to provide you with the care you need, when you need it.  We recommend signing up for the patient portal called "MyChart".  Sign up information is provided on this After Visit Summary.  MyChart is used to connect with patients for Virtual Visits (Telemedicine).  Patients are able to view lab/test results, encounter notes, upcoming appointments, etc.  Non-urgent messages can be sent to your provider as well.   To learn more about what you can do with MyChart, go to [email or web address].    Your next appointment:   12 month(s)  The format for your next appointment:   In Person  Provider:   Jodelle Red, MD or Ronn Melena NP

## 2023-04-15 ENCOUNTER — Other Ambulatory Visit: Payer: Self-pay | Admitting: *Deleted

## 2023-04-15 DIAGNOSIS — Z1501 Genetic susceptibility to malignant neoplasm of breast: Secondary | ICD-10-CM

## 2023-04-17 ENCOUNTER — Encounter: Payer: Self-pay | Admitting: Nurse Practitioner

## 2023-04-17 ENCOUNTER — Inpatient Hospital Stay: Payer: Medicare HMO | Attending: Nurse Practitioner

## 2023-04-17 ENCOUNTER — Other Ambulatory Visit: Payer: Self-pay

## 2023-04-17 ENCOUNTER — Inpatient Hospital Stay (HOSPITAL_BASED_OUTPATIENT_CLINIC_OR_DEPARTMENT_OTHER): Payer: Medicare HMO | Admitting: Nurse Practitioner

## 2023-04-17 VITALS — BP 131/60 | HR 86 | Temp 97.3°F | Resp 20 | Wt 174.7 lb

## 2023-04-17 DIAGNOSIS — Z940 Kidney transplant status: Secondary | ICD-10-CM | POA: Insufficient documentation

## 2023-04-17 DIAGNOSIS — Z170 Estrogen receptor positive status [ER+]: Secondary | ICD-10-CM | POA: Insufficient documentation

## 2023-04-17 DIAGNOSIS — M2550 Pain in unspecified joint: Secondary | ICD-10-CM | POA: Diagnosis not present

## 2023-04-17 DIAGNOSIS — I10 Essential (primary) hypertension: Secondary | ICD-10-CM | POA: Diagnosis not present

## 2023-04-17 DIAGNOSIS — C50312 Malignant neoplasm of lower-inner quadrant of left female breast: Secondary | ICD-10-CM

## 2023-04-17 DIAGNOSIS — E1140 Type 2 diabetes mellitus with diabetic neuropathy, unspecified: Secondary | ICD-10-CM | POA: Insufficient documentation

## 2023-04-17 DIAGNOSIS — E1159 Type 2 diabetes mellitus with other circulatory complications: Secondary | ICD-10-CM | POA: Insufficient documentation

## 2023-04-17 DIAGNOSIS — Z9013 Acquired absence of bilateral breasts and nipples: Secondary | ICD-10-CM | POA: Diagnosis not present

## 2023-04-17 DIAGNOSIS — Z1501 Genetic susceptibility to malignant neoplasm of breast: Secondary | ICD-10-CM

## 2023-04-17 LAB — CBC WITH DIFFERENTIAL (CANCER CENTER ONLY)
Abs Immature Granulocytes: 0.02 10*3/uL (ref 0.00–0.07)
Basophils Absolute: 0 10*3/uL (ref 0.0–0.1)
Basophils Relative: 1 %
Eosinophils Absolute: 0.1 10*3/uL (ref 0.0–0.5)
Eosinophils Relative: 2 %
HCT: 36.4 % (ref 36.0–46.0)
Hemoglobin: 12.6 g/dL (ref 12.0–15.0)
Immature Granulocytes: 0 %
Lymphocytes Relative: 24 %
Lymphs Abs: 1.6 10*3/uL (ref 0.7–4.0)
MCH: 31.3 pg (ref 26.0–34.0)
MCHC: 34.6 g/dL (ref 30.0–36.0)
MCV: 90.3 fL (ref 80.0–100.0)
Monocytes Absolute: 0.5 10*3/uL (ref 0.1–1.0)
Monocytes Relative: 8 %
Neutro Abs: 4.5 10*3/uL (ref 1.7–7.7)
Neutrophils Relative %: 65 %
Platelet Count: 154 10*3/uL (ref 150–400)
RBC: 4.03 MIL/uL (ref 3.87–5.11)
RDW: 12.4 % (ref 11.5–15.5)
WBC Count: 6.8 10*3/uL (ref 4.0–10.5)
nRBC: 0 % (ref 0.0–0.2)

## 2023-04-17 LAB — CMP (CANCER CENTER ONLY)
ALT: 25 U/L (ref 0–44)
AST: 18 U/L (ref 15–41)
Albumin: 4 g/dL (ref 3.5–5.0)
Alkaline Phosphatase: 61 U/L (ref 38–126)
Anion gap: 5 (ref 5–15)
BUN: 44 mg/dL — ABNORMAL HIGH (ref 6–20)
CO2: 28 mmol/L (ref 22–32)
Calcium: 10 mg/dL (ref 8.9–10.3)
Chloride: 108 mmol/L (ref 98–111)
Creatinine: 1.89 mg/dL — ABNORMAL HIGH (ref 0.44–1.00)
GFR, Estimated: 31 mL/min — ABNORMAL LOW (ref >60)
Glucose, Bld: 105 mg/dL — ABNORMAL HIGH (ref 70–99)
Potassium: 4 mmol/L (ref 3.5–5.1)
Sodium: 141 mmol/L (ref 135–145)
Total Bilirubin: 0.4 mg/dL (ref 0.3–1.2)
Total Protein: 6.2 g/dL — ABNORMAL LOW (ref 6.5–8.1)

## 2023-04-17 NOTE — Progress Notes (Signed)
Patient Care Team: Loura Back, NP as PCP - General (Nurse Practitioner) Jodelle Red, MD as PCP - Cardiology (Cardiology) Arita Miss, MD as Consulting Physician (Nephrology) Ethelene Hal, MD as Referring Physician (Nephrology) Malachy Mood, MD as Consulting Physician (Hematology) Harriette Bouillon, MD as Consulting Physician (General Surgery) Causey, Larna Daughters, NP as Nurse Practitioner (Hematology and Oncology) Maryclare Labrador, RN as Registered Nurse   CHIEF COMPLAINT: Follow up left breast cancer, BRCA2 positive   Oncology History Overview Note  Cancer Staging Malignant neoplasm of lower-inner quadrant of left breast in female, estrogen receptor positive (HCC) Staging form: Breast, AJCC 8th Edition - Clinical stage from 02/09/2021: Stage IA (cT1c, cN0, cM0, G2, ER+, PR+, HER2-) - Signed by Malachy Mood, MD on 02/20/2021 Stage prefix: Initial diagnosis Histologic grading system: 3 grade system - Pathologic stage from 03/02/2021: Stage IA (pT2, pN0, cM0, G2, ER+, PR+, HER2-, Oncotype DX score: 26) - Signed by Malachy Mood, MD on 03/21/2021 Stage prefix: Initial diagnosis Multigene prognostic tests performed: Oncotype DX Recurrence score range: Greater than or equal to 11 Histologic grading system: 3 grade system Residual tumor (R): R0 - None    Malignant neoplasm of lower-inner quadrant of left breast in female, estrogen receptor positive (HCC)  02/07/2021 Mammogram   Exam: Left Diagnostic Mammogram; Left Breast Ultrasound  IMPRESSION: Irregular mass in left breast at 6:30, measuring 1.6 cm by mammogram, is highly suggestive of malignancy. Left axillary ultrasound is negative for lymphadenopathy.   02/09/2021 Cancer Staging   Staging form: Breast, AJCC 8th Edition - Clinical stage from 02/09/2021: Stage IA (cT1c, cN0, cM0, G2, ER+, PR+, HER2-) - Signed by Malachy Mood, MD on 02/20/2021 Stage prefix: Initial diagnosis Histologic grading system: 3 grade system   02/09/2021  Pathology Results   Diagnosis Breast, left, needle core biopsy, 6:30 o'clock 8cm fn - INVASIVE DUCTAL CARCINOMA WITH HISTIOCYTOID FEATURES. SEE NOTE Diagnosis Note Carcinoma measures 1 cm in greatest linear dimension and appears grade 2.  PROGNOSTIC INDICATORS Results: IMMUNOHISTOCHEMICAL AND MORPHOMETRIC ANALYSIS PERFORMED MANUALLY The tumor cells are EQUIVOCAL for Her2 (2+). Her2 by FISH will be performed and results performed separately. Estrogen Receptor: >95%, POSITIVE, STRONG STAINING INTENSITY Progesterone Receptor: 90%, POSITIVE, STRONG STAINING INTENSITY Proliferation Marker Ki67: 20%  FLUORESCENCE IN-SITU HYBRIDIZATION Results: GROUP 5: HER2 **NEGATIVE** Equivocal form of amplification of the HER2 gene was detected in the IHC 2+ tissue sample received from this individual. HER2 FISH was performed by a technologist and cell imaging and analysis on the [employer]. RATIO OF HER2/CEN17 SIGNALS 1.30 AVERAGE HER2 COPY NUMBER PER CELL 1.75   02/15/2021 Initial Diagnosis   Malignant neoplasm of lower-inner quadrant of left breast in female, estrogen receptor positive (HCC)   03/01/2021 Genetic Testing   Positive genetic testing: pathogenic variant detected in BRCA2 at [id number].  No other pathogenic variants detected in [facility] Panel.  The report date is 03/19/2021.  The CustomNext-Cancer+RNAinsight panel offered by [employer] includes sequencing and rearrangement analysis for the following 47 genes:  APC, ATM, AXIN2, BARD1, BMPR1A, BRCA1, BRCA2, BRIP1, CDH1, CDK4, CDKN2A, CHEK2, DICER1, EPCAM, GREM1, HOXB13, MEN1, MLH1, MSH2, MSH3, MSH6, MUTYH, NBN, NF1, NF2, NTHL1, PALB2, PMS2, POLD1, POLE, PTEN, RAD51C, RAD51D, RECQL, RET, SDHA, SDHAF2, SDHB, SDHC, SDHD, SMAD4, SMARCA4, STK11, TP53, TSC1, TSC2, and VHL.  RNA data is routinely analyzed for use in variant interpretation for all genes.   03/02/2021 Cancer Staging   Staging form: Breast, AJCC 8th Edition - Pathologic  stage from 03/02/2021: Stage IA (pT2, pN0,  cM0, G2, ER+, PR+, HER2-, Oncotype DX score: 26) - Signed by Malachy Mood, MD on 03/21/2021 Stage prefix: Initial diagnosis Multigene prognostic tests performed: Oncotype DX Recurrence score range: Greater than or equal to 11 Histologic grading system: 3 grade system Residual tumor (R): R0 - None   05/02/2021 - 07/12/2021 Chemotherapy   Patient is on Treatment Plan : BREAST TC q21d      08/22/2021 Pathology Results   FINAL MICROSCOPIC DIAGNOSIS:   A. BREAST, LEFT, MASTECTOMY:  - Fibrocystic changes.  - Lumpectomy scar with hemosiderin deposition.  - No malignancy identified.   B. BREAST, RIGHT, MASTECTOMY:  - Fibrocystic changes with focal fibroadenomatoid change.  - No malignancy identified.    08/2021 -  Anti-estrogen oral therapy   Exemestane x 6 months, discontinued due to arthralgias and changed to Tamoxifen in 04/2022   07/11/2022 Surgery   BSO with Dr. Pricilla Holm: benign bilateral fallopian tubes and ovaries. (Pt already s/p TAH)      CURRENT THERAPY: Tamoxifen   INTERVAL HISTORY Ms. Cynthia [name] returns for follow up as scheduled. Last seen by me colleague Collene Gobble NP 09/20/22.  She has had pain in the chest wall/bilateral ribs for about 2 months, occasional pressure and "pinching."  Denies fall or new exercise.  She does lift her grandchildren who are babies a couple times a week but is not new for her.  She is being evaluated for dizziness which has been present 1 to 2 years.  She has had headaches in the past that have become more frequent, now 3-4 times a week.  Nephrologist recently put her on tacrolimus, she has noticed cramping in her legs and hands that started around that time.  She also notes the increased headaches began at the time she started tacro.  ROS  All other systems reviewed and negative  Past Medical History:  Diagnosis Date   Allergy    pollen   Anemia    Anxiety    BRCA2 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 03/01/2021    Breast cancer [facility])    Cataract    surgical repair bilateral   Complication of anesthesia    not herself for 2 days after last surgery (brest lumpectomy)   Coronary artery disease    Depression    ESRD on hemodialysis (HCC)    Home HD 5x per week- not on dialysis now had tramsplant 4/17   Family history of ovarian cancer 02/22/2021   GERD (gastroesophageal reflux disease)    Hearing loss 2017   right ear   History of blood transfusion    transfusion reaction   Hyperlipidemia    Hypertension    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 per pt-dr levy also II; Per patient 08/18/21 she is Type 2   Kidney transplant recipient    Leukocytosis 06/13/2021   Neuromuscular disorder (HCC)    NEUROPATHY   Sleep apnea      Past Surgical History:  Procedure Laterality Date   ABDOMINAL HYSTERECTOMY     BREAST EXCISIONAL BIOPSY Right 08/2015   BREAST LUMPECTOMY WITH RADIOACTIVE SEED LOCALIZATION Right 09/14/2015   Procedure: RIGHT BREAST LUMPECTOMY WITH RADIOACTIVE SEED LOCALIZATION;  Surgeon: Manus Rudd, MD;  Location: [facility];  Service: General;  Laterality: Right;   BREAST LUMPECTOMY WITH RADIOACTIVE SEED LOCALIZATION Left 03/02/2021   Procedure: LEFT BREAST SEED LUMPECTOMY LEFT SENTINEL LYMPH NODE MAPPING;  Surgeon: Harriette Bouillon, MD;  Location: [facility];  Service: General;  Laterality: Left;  GEN AND PEC BLOCK   BREAST SURGERY Bilateral    biopsy bilateral   CARDIAC CATHETERIZATION     CATARACT EXTRACTION Bilateral    bilateral   CHOLECYSTECTOMY     CYST REMOVAL NECK     DIALYSIS FISTULA CREATION Left    EYE SURGERY Bilateral    lazer   kidney transplant     LEFT HEART CATH AND CORONARY ANGIOGRAPHY N/A 03/04/2019   Procedure: LEFT HEART CATH AND CORONARY ANGIOGRAPHY;  Surgeon: Swaziland, Peter M, MD;  Location: [facility] INVASIVE CV LAB;  Service: Cardiovascular;  Laterality: N/A;   LEFT HEART  CATHETERIZATION WITH CORONARY ANGIOGRAM N/A 03/30/2014   Procedure: LEFT HEART CATHETERIZATION WITH CORONARY ANGIOGRAM;  Surgeon: Lesleigh Noe, MD;  Location: [facility] CATH LAB;  Service: Cardiovascular;  Laterality: N/A;   LIPOMA EXCISION N/A 02/06/2017   Procedure: EXCISION POSTERIOR NECK SEBACEOUS CYST;  Surgeon: Abigail Miyamoto, MD;  Location: [facility] OR;  Service: General;  Laterality: N/A;   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   Procedure: REPAIR OF ARTERIOVENOUS FISTULA ANEURYSM;  Surgeon: Nada Libman, MD;  Location: [facility] OR;  Service: Vascular;  Laterality: Left;   REVISON OF ARTERIOVENOUS FISTULA Left 09/28/2013   Procedure: EXCISE ESCHAR LEFT ARM  ARTERIOVENOUS FISTULA WITH PLICATION OF LEFT ARM ARTERIOVENOUS FISTULA;  Surgeon: Sherren Kerns, MD;  Location[facility] OR;  Service: Vascular;  Laterality: Left;   REVISON OF ARTERIOVENOUS FISTULA Left 08/26/2015   Procedure: RESECTION ANEURYSM OF LEFT ARM ARTERIOVENOUS FISTULA  ;  Surgeon: Nada Libman, MD;  Location: [facility] OR;  Service: Vascular;  Laterality: Left;   ROBOTIC ASSISTED BILATERAL SALPINGO OOPHERECTOMY N/A 07/11/2022   Procedure: XI ROBOTIC ASSISTED BILATERAL SALPINGO OOPHORECTOMY;  Surgeon: Carver Fila, MD;  Location: [facility]S;  Service: Gynecology;  Laterality: N/A;   SIMPLE MASTECTOMY WITH AXILLARY SENTINEL NODE BIOPSY Bilateral 08/22/2021   Procedure: BILATERAL SIMPLE MASTECTOMY;  Surgeon: Harriette Bouillon, MD;  Location: [facility] OR;  Service: General;  Laterality: Bilateral;   TUBAL LIGATION       Outpatient Encounter Medications as of 04/17/2023  Medication Sig Note   Accu-Chek Softclix Lancets lancets Use as instructed (Patient taking differently: 1 each by Other route See admin instructions. Use as instructed)    acetaminophen (TYLENOL) 500 MG tablet Take 500 mg by mouth every 6 (six) hours as needed for mild pain, fever or headache.    aspirin EC 81 MG tablet Take 81 mg by mouth daily.    Blood  Glucose Monitoring Suppl (ACCU-CHEK AVIVA CONNECT) w/Device KIT Check sugar 1x daily (Patient taking differently: 1 each by Other route See admin instructions. Check sugar 1x daily)    carvedilol (COREG) 25 MG tablet Take 25 mg by mouth 2 (two) times daily with a meal.    cetirizine (ZYRTEC) 10 MG tablet Take 10 mg by mouth daily as needed for allergies.    cholecalciferol (VITAMIN D3) 25 MCG (1000 UNIT) tablet Take 1,000 Units by mouth daily.    cyanocobalamin 1000 MCG tablet Take 1,000 mcg by mouth daily.    DROPLET PEN NEEDLES 31G X 8 MM MISC     ENVARSUS XR 1 MG TB24 Take 4 mg by mouth daily.    escitalopram (LEXAPRO) 10 MG tablet Take 10 mg by mouth daily.    Fluoxetine HCl, PMDD, 20 MG TABS Take 20 mg by mouth daily.    furosemide (LASIX) 40 MG tablet Take 40 mg by mouth 2 (  two) times daily as needed for fluid.    gabapentin (NEURONTIN) 300 MG capsule Take 1 capsule (300 mg total) by mouth 2 (two) times daily. Start at 1 cap once daily, and increase to twice daily if she tolerates. Continue 600mg  at night (Patient taking differently: Take 300-600 mg by mouth See admin instructions. Take 300 mg by mouth in the morning and 600 mg at night)    Garlic 100 MG TABS Take 100 mg by mouth daily.    glucose blood (ACCU-CHEK AVIVA PLUS) test strip Check sugar 1x daily (Patient taking differently: 1 each by Other route See admin instructions. Check sugar 1x daily)    insulin glargine (LANTUS SOLOSTAR) 100 UNIT/ML Solostar Pen Inject 25-30 Units into the skin See admin instructions. Inject 30 units into the skin in the morning and 25 units at bedtime 07/11/2022: Per patient she took 10units AM of surgery on 07/11/22   Insulin Pen Needle (PEN NEEDLES) 30G X 8 MM MISC 1 each by Does not apply route daily. E11.9    magnesium oxide (MAG-OX) 400 MG tablet Take 400 mg by mouth 2 (two) times daily.    multivitamin-lutein (OCUVITE-LUTEIN) CAPS capsule Take 1 capsule by mouth daily.    mycophenolate (MYFORTIC) 180 MG  EC tablet Take 360 mg by mouth 2 (two) times daily.    oxyCODONE (OXY IR/ROXICODONE) 5 MG immediate release tablet Take 1 tablet (5 mg total) by mouth every 6 (six) hours as needed for severe pain. For AFTER surgery only, do not take and drive (Patient taking differently: Take 5 mg by mouth 2 (two) times daily as needed for severe pain (pain score 7-10).)    pantoprazole (PROTONIX) 40 MG tablet Take 1 tablet (40 mg total) by mouth 2 (two) times daily.    predniSONE (DELTASONE) 5 MG tablet Take 5 mg by mouth daily.     rosuvastatin (CRESTOR) 5 MG tablet Take 5 mg by mouth at bedtime.    senna-docusate (SENOKOT-S) 8.6-50 MG tablet Take 2 tablets by mouth at bedtime. For AFTER surgery, do not take if having diarrhea    tamoxifen (NOLVADEX) 20 MG tablet TAKE 1 TABLET(20 MG) BY MOUTH DAILY    tirzepatide (MOUNJARO) 12.5 MG/0.5ML Pen Inject 12.5 mg into the skin once a week.    hydrALAZINE (APRESOLINE) 25 MG tablet Take 25 mg by mouth 2 (two) times daily. (Patient not taking: Reported on 04/17/2023)    No facility-administered encounter medications on file as of 04/17/2023.     Today's Vitals   04/17/23 1115  BP: 131/60  Pulse: 86  Resp: 20  Temp: (!) 97.3 F (36.3 C)  SpO2: 99%  Weight: 174 lb 11.2 oz (79.2 kg)   Body mass index is 33.01 kg/m.   PHYSICAL EXAM GENERAL:alert, no distress and comfortable SKIN: no rash  EYES: sclera clear NECK: without mass LYMPH:  no palpable cervical or supraclavicular lymphadenopathy  LUNGS: clear with normal breathing effort HEART: regular rate & rhythm, no lower extremity edema ABDOMEN: abdomen soft, non-tender and normal bowel sounds NEURO: alert & oriented x 3 with fluent speech, no focal motor/sensory deficits Breast exam: s/p bilateral mastectomy, multiple surgical scars completely healed. No palpable mass or nodularity on the scars, chest wall, or either axilla that I could appreciate MSK: generalized ttp to the ribs at b/l mastectomy  sites   CBC    Component Value Date/Time   WBC 6.8 04/17/2023 1044   WBC 5.7 07/09/2022 0930   RBC 4.03 04/17/2023 1044  HGB 12.6 04/17/2023 1044   HGB 13.5 02/27/2019 1152   HCT 36.4 04/17/2023 1044   HCT 39.5 02/27/2019 1152   PLT 154 04/17/2023 1044   PLT 186 02/27/2019 1152   MCV 90.3 04/17/2023 1044   MCV 94 02/27/2019 1152   MCH 31.3 04/17/2023 1044   MCHC 34.6 04/17/2023 1044   RDW 12.4 04/17/2023 1044   RDW 12.2 02/27/2019 1152   LYMPHSABS 1.6 04/17/2023 1044   LYMPHSABS 1.7 02/27/2019 1152   MONOABS 0.5 04/17/2023 1044   EOSABS 0.1 04/17/2023 1044   EOSABS 0.1 02/27/2019 1152   BASOSABS 0.0 04/17/2023 1044   BASOSABS 0.0 02/27/2019 1152     CMP     Component Value Date/Time   NA 141 04/17/2023 1044   NA 136 02/27/2019 1152   K 4.0 04/17/2023 1044   CL 108 04/17/2023 1044   CO2 28 04/17/2023 1044   GLUCOSE 105 (H) 04/17/2023 1044   BUN 44 (H) 04/17/2023 1044   BUN 19 02/27/2019 1152   CREATININE 1.89 (H) 04/17/2023 1044   CALCIUM 10.0 04/17/2023 1044   PROT 6.2 (L) 04/17/2023 1044   ALBUMIN 4.0 04/17/2023 1044   AST 18 04/17/2023 1044   ALT 25 04/17/2023 1044   ALKPHOS 61 04/17/2023 1044   BILITOT 0.4 04/17/2023 1044   GFRNONAA 31 (L) 04/17/2023 1044   GFRAA >60 06/06/2019 0250     ASSESSMENT & PLAN:Analyssa Yoss is a 52 y.o. post partial hysterectomy female with    1. 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 Ki-67 of 20%. Oncotype showed high risk. -She began adjuvant TC on 05/02/21 after delayed wound healing. She tolerated C1 poorly with nausea with vomiting and diarrhea. She was able to recover well. She experienced cystitis following C2 and C3. She managed to complete 4 cycles, but she has had a slow recovery. -she started exemestane  in 08/2021. She has developed worsening joint pains on exemestane and switched to tamoxifen in 04/2022 -continue surveillance, no mammogram s/p bilateral mastectomy. -Ms. Bracken appears stable. Tolerating tamoxifen well overall. She has 2 month h/o tenderness at the mastectomy sites, sporadic ttp to the ribs without palpable abnormality. We reviewed this could be surgical pain vs msk pain from lifting grandchildren, vs other.  -I offered chest/rib Xray but she had to leave for another appointment. Will come back for Xray at a dater date.  -I recommend to hold Tamoxifen to see if this is joint pain from medication.  -If pain resolves, will change to alternative anti-estrogen. If pain does not improve, will likely restart tamoxifen and proceed with further imaging -F/up in 4 weeks  2. Dizziness, headaches  -She h/o headaches, and at least 1-2 year h/o dizziness -on BP meds, plus fluoxetine, escitalopram (has been on this combo for a while), and other meds; recently stopped hydralazine -PCP evaluating dizziness, currently wearing zio monitor -Headaches more frequent since starting/increasing tacro, I encouraged her to f/up with nephrology. Neuro tox is common SE -If symptoms persist may get brain MRI   3. 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   4. BRCA2 + pathogenic mutation  -Found on BRCA1/2 analysis and [facility]  genetics panel from 02/22/21 -We previously reviewed the increased risk of recurrent breast and or contralateral breast cancer, ovarian cancer, pancreatic cancer, and melanoma -she underwent bilateral mastectomies on 08/22/21 by Dr. Luisa Hart, path was negative. She is currently looking into reconstruction. -s/p BSO (s/p partial hysterectomy in 2001) 07/11/22 by Dr. Pricilla Holm  -her 3 daughters have been  tested and all are negative.    PLAN: -Labs reviewed -Hold Tamoxifen due to rib pain -Xray today/this week -F/up in 4 weeks -Continue PCP eval for dizzines and monitor headaches    Orders Placed This Encounter  Procedures   DG Ribs Bilateral    Standing Status:   Future    Standing Expiration Date:   04/16/2024    Order Specific Question:   Reason for Exam (SYMPTOM  OR DIAGNOSIS REQUIRED)    Answer:   h/o breast cancer s/p b/l mastectomy, on tamoxifen. rib pain and ttp    Order Specific Question:   Is patient pregnant?    Answer:   No    Order Specific Question:   Preferred imaging location?    Answer:   [facility]      All questions were answered. The patient knows to call the clinic with any problems, questions or concerns. No barriers to learning were detected. I spent 20 minutes counseling the patient face to face. The total time spent in the appointment was 30 minutes and more than 50% was on counseling, review of test results, and coordination of care.   Santiago Glad, NP-C 04/17/2023

## 2023-04-18 ENCOUNTER — Ambulatory Visit: Payer: Medicare HMO | Admitting: Psychology

## 2023-04-25 ENCOUNTER — Ambulatory Visit: Payer: Medicare HMO | Admitting: Psychology

## 2023-05-02 ENCOUNTER — Ambulatory Visit: Payer: Medicare HMO | Admitting: Psychology

## 2023-05-08 ENCOUNTER — Other Ambulatory Visit: Payer: Self-pay

## 2023-05-08 ENCOUNTER — Ambulatory Visit (HOSPITAL_COMMUNITY)
Admission: RE | Admit: 2023-05-08 | Discharge: 2023-05-08 | Disposition: A | Discharge status: Home / Self Care | Payer: Medicare HMO | Source: Ambulatory Visit | Attending: Nurse Practitioner | Admitting: Nurse Practitioner

## 2023-05-08 DIAGNOSIS — Z170 Estrogen receptor positive status [ER+]: Secondary | ICD-10-CM

## 2023-05-08 DIAGNOSIS — C50312 Malignant neoplasm of lower-inner quadrant of left female breast: Secondary | ICD-10-CM | POA: Diagnosis present

## 2023-05-15 NOTE — Progress Notes (Unsigned)
Patient Care Team: Cynthia Back, NP as PCP - General (Nurse Practitioner) Cynthia Red, MD as PCP - Cardiology (Cardiology) Cynthia Miss, MD as Consulting Physician (Nephrology) Cynthia Hal, MD as Referring Physician (Nephrology) Cynthia Mood, MD as Consulting Physician (Hematology) Cynthia Bouillon, MD as Consulting Physician (General Surgery) Cynthia [name], Larna Daughters, NP as Nurse Practitioner (Hematology and Oncology) Cynthia Labrador, RN as Registered Nurse  Clinic Day:  05/16/2023  Referring physician: Loura Back, NP  ASSESSMENT & PLAN:   Assessment & Plan: Malignant neoplasm of lower-inner quadrant of left breast in female, estrogen receptor positive (HCC)  Stage IA, 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 Ki-67 of 20%. Oncotype showed high risk. -She began adjuvant TC on 05/02/21 after delayed wound healing. She tolerated C1 poorly with nausea with vomiting and diarrhea. She was able to recover well. She experienced cystitis following C2 and C3. She managed to complete 4 cycles, but she has had a slow recovery. -she started exemestane in 08/2021. She has developed worsening joint pains on exemestane and switched to tamoxifen in 04/2022 -continue surveillance, no mammogram s/p bilateral mastectomy. -[name] appears stable. Tolerating tamoxifen well overall. She has 2 month h/o tenderness at the mastectomy sites, sporadic ttp to the ribs without palpable abnormality. We reviewed this could be surgical pain vs masked pain from lifting grandchildren, vs other.  -chest/rib Xray was offered, but she had to leave for another appointment. Will come [name] for Xray at a later date.  -was recommended she hold Tamoxifen to see if this is joint pain from medication.  -If pain resolves, will change to alternative anti-estrogen. If pain does not  improve, will likely restart tamoxifen and proceed with further imaging -she did go for x-ray of the ribs on 05/08/2023.  -F/up in 4 weeks   Plan: Reviewed rib x-ray with the patient. Results were normal.  -will get ultrasound of the right outer breast/axillary area.  -increase gabapentin to 2 capsules twice daily  -continue to take tylenol as needed and as indicated. -voltaren gel may be applied to surgical scars to reduce itching sensation. Consider adding tea tre oil or mederma to help reduce nerve irritation around surgical scars.  Will order bilateral lymphedema sleeves for patient to use while travelling.  Follow up in 4 to 6 weeks to review results of ultrasound and response to medication.  The patient understands the plans discussed today and is in agreement with them.  She knows to contact our office if she develops concerns prior to her next appointment.  I provided 25 minutes of face-to-face time during this encounter and > 50% was spent counseling as documented under my assessment and plan.    Cynthia Jews, NP  [facility] OF [facility] [address] [place] [address] Dept: [phone] Dept Fax: [phone]   Orders Placed This Encounter  Procedures   [place] LIMITED ULTRASOUND INCLUDING AXILLA RIGHT BREAST    Standing Status:   Future    Standing Expiration Date:   05/15/2024    Order Specific Question:   Reason for Exam (SYMPTOM  OR DIAGNOSIS REQUIRED)    Answer:   severe tenderness outer quadrant of right breast area, axillary line. she is post bilateral mastectomy    Order Specific Question:   Preferred imaging location?  Answer:   [facility]      CHIEF COMPLAINT:  CC: left breast cancer   Current Treatment:  tamoxifen daily since 04/2022 (previously on exemestane 08/2021 - 04/2022)  INTERVAL HISTORY:  Cynthia [name] is here today for repeat clinical assessment. She was  last seen by Cynthia Heron, NP on 04/17/2023. Had been having pain at mastectomy site. She did go for x-ray of the ribs 05/08/2023.  X-ray was negative for acute abnormalities or osseous/lytic lesions. She continues to have pain, most severe around the right axillary area, adjacent to the outer right breast tissue. She states that she sometimes feels what she thinks is a lump in this area. She states that skin around the scars is always itchy. She states that she will be going to [place] in February. She would like to get lymphedema sleeves to help reduce the swelling of her arms. She denies fevers or chills. Her appetite is good. Her weight has increased 2 pounds over last 2 weeks .  I have reviewed the past medical history, past surgical history, social history and family history with the patient and they are unchanged from previous note.  ALLERGIES:  is allergic to docetaxel, norvasc [amlodipine], and tape.  MEDICATIONS:  Current Outpatient Medications  Medication Sig Dispense Refill   Accu-Chek Softclix Lancets lancets Use as instructed (Patient taking differently: 1 each by Other route See admin instructions. Use as instructed) 100 each 12   acetaminophen (TYLENOL) 500 MG tablet Take 500 mg by mouth every 6 (six) hours as needed for mild pain, fever or headache.     aspirin EC 81 MG tablet Take 81 mg by mouth daily.     Blood Glucose Monitoring Suppl (ACCU-CHEK AVIVA CONNECT) w/Device KIT Check sugar 1x daily (Patient taking differently: 1 each by Other route See admin instructions. Check sugar 1x daily) 1 kit 0   carvedilol (COREG) 25 MG tablet Take 25 mg by mouth 2 (two) times daily with a meal.     cetirizine (ZYRTEC) 10 MG tablet Take 10 mg by mouth daily as needed for allergies.     cholecalciferol (VITAMIN D3) 25 MCG (1000 UNIT) tablet Take 1,000 Units by mouth daily.     cyanocobalamin 1000 MCG tablet Take 1,000 mcg by mouth daily.     diclofenac Sodium (VOLTAREN) 1 % GEL Apply 4 g topically 4  (four) times daily. 100 g 1   DROPLET PEN NEEDLES 31G X 8 MM MISC      ENVARSUS XR 1 MG TB24 Take 4 mg by mouth daily.     escitalopram (LEXAPRO) 10 MG tablet Take 10 mg by mouth daily.     Fluoxetine HCl, PMDD, 20 MG TABS Take 20 mg by mouth daily.     furosemide (LASIX) 40 MG tablet Take 40 mg by mouth 2 (two) times daily as needed for fluid.     Garlic 100 MG TABS Take 100 mg by mouth daily.     glucose blood (ACCU-CHEK AVIVA PLUS) test strip Check sugar 1x daily (Patient taking differently: 1 each by Other route See admin instructions. Check sugar 1x daily) 100 each 12   hydrALAZINE (APRESOLINE) 25 MG tablet Take 25 mg by mouth 2 (two) times daily.     insulin glargine (LANTUS SOLOSTAR) 100 UNIT/ML Solostar Pen Inject 25-30 Units into the skin See admin instructions. Inject 30 units into the skin in the morning and 25 units at bedtime     Insulin Pen Needle (PEN NEEDLES) 30G X 8  MM MISC 1 each by Does not apply route daily. E11.9 90 each 0   magnesium oxide (MAG-OX) 400 MG tablet Take 400 mg by mouth 2 (two) times daily.     multivitamin-lutein (OCUVITE-LUTEIN) CAPS capsule Take 1 capsule by mouth daily.     mycophenolate (MYFORTIC) 180 MG EC tablet Take 360 mg by mouth 2 (two) times daily.     oxyCODONE (OXY IR/ROXICODONE) 5 MG immediate release tablet Take 1 tablet (5 mg total) by mouth every 6 (six) hours as needed for severe pain. For AFTER surgery only, do not take and drive (Patient taking differently: Take 5 mg by mouth 2 (two) times daily as needed for severe pain (pain score 7-10).) 15 tablet 0   pantoprazole (PROTONIX) 40 MG tablet Take 1 tablet (40 mg total) by mouth 2 (two) times daily. 60 tablet 11   predniSONE (DELTASONE) 5 MG tablet Take 5 mg by mouth daily.   2   rosuvastatin (CRESTOR) 5 MG tablet Take 5 mg by mouth at bedtime.     senna-docusate (SENOKOT-S) 8.6-50 MG tablet Take 2 tablets by mouth at bedtime. For AFTER surgery, do not take if having diarrhea 30 tablet 0    tamoxifen (NOLVADEX) 20 MG tablet TAKE 1 TABLET(20 MG) BY MOUTH DAILY 30 tablet 3   tirzepatide (MOUNJARO) 12.5 MG/0.5ML Pen Inject 12.5 mg into the skin once a week.     gabapentin (NEURONTIN) 300 MG capsule Take 2 capsules (600 mg total) by mouth 2 (two) times daily. 120 capsule 1   No current facility-administered medications for this visit.    HISTORY OF PRESENT ILLNESS:   Oncology History Overview Note  Cancer Staging Malignant neoplasm of lower-inner quadrant of left breast in female, estrogen receptor positive (HCC) Staging form: Breast, AJCC 8th Edition - Clinical stage from 02/09/2021: Stage IA (cT1c, cN0, cM0, G2, ER+, PR+, HER2-) - Signed by Cynthia Mood, MD on 02/20/2021 Stage prefix: Initial diagnosis Histologic grading system: 3 grade system - Pathologic stage from 03/02/2021: Stage IA (pT2, pN0, cM0, G2, ER+, PR+, HER2-, Oncotype DX score: 26) - Signed by Cynthia Mood, MD on 03/21/2021 Stage prefix: Initial diagnosis Multigene prognostic tests performed: Oncotype DX Recurrence score range: Greater than or equal to 11 Histologic grading system: 3 grade system Residual tumor (R): R0 - None    Malignant neoplasm of lower-inner quadrant of left breast in female, estrogen receptor positive (HCC)  02/07/2021 Mammogram   Exam: Left Diagnostic Mammogram; Left Breast Ultrasound  IMPRESSION: Irregular mass in left breast at 6:30, measuring 1.6 cm by mammogram, is highly suggestive of malignancy. Left axillary ultrasound is negative for lymphadenopathy.   02/09/2021 Cancer Staging   Staging form: Breast, AJCC 8th Edition - Clinical stage from 02/09/2021: Stage IA (cT1c, cN0, cM0, G2, ER+, PR+, HER2-) - Signed by Cynthia Mood, MD on 02/20/2021 Stage prefix: Initial diagnosis Histologic grading system: 3 grade system   02/09/2021 Pathology Results   Diagnosis Breast, left, needle core biopsy, 6:30 o'clock 8cm fn - INVASIVE DUCTAL CARCINOMA WITH HISTIOCYTOID FEATURES. SEE NOTE Diagnosis  Note Carcinoma measures 1 cm in greatest linear dimension and appears grade 2.  PROGNOSTIC INDICATORS Results: IMMUNOHISTOCHEMICAL AND MORPHOMETRIC ANALYSIS PERFORMED MANUALLY The tumor cells are EQUIVOCAL for Her2 (2+). Her2 by FISH will be performed and results performed separately. Estrogen Receptor: >95%, POSITIVE, STRONG STAINING INTENSITY Progesterone Receptor: 90%, POSITIVE, STRONG STAINING INTENSITY Proliferation Marker Ki67: 20%  FLUORESCENCE IN-SITU HYBRIDIZATION Results: GROUP 5: HER2 **NEGATIVE** Equivocal form of amplification of the HER2  gene was detected in the IHC 2+ tissue sample received from this individual. HER2 FISH was performed by a technologist and cell imaging and analysis on the [employer]. RATIO OF HER2/CEN17 SIGNALS 1.30 AVERAGE HER2 COPY NUMBER PER CELL 1.75   02/15/2021 Initial Diagnosis   Malignant neoplasm of lower-inner quadrant of left breast in female, estrogen receptor positive (HCC)   03/01/2021 Genetic Testing   Positive genetic testing: pathogenic variant detected in BRCA2 at [id number].  No other pathogenic variants detected in [facility] Panel.  The report date is 03/19/2021.  The CustomNext-Cancer+RNAinsight panel offered by [employer] includes sequencing and rearrangement analysis for the following 47 genes:  APC, ATM, AXIN2, BARD1, BMPR1A, BRCA1, BRCA2, BRIP1, CDH1, CDK4, CDKN2A, CHEK2, DICER1, EPCAM, GREM1, HOXB13, MEN1, MLH1, MSH2, MSH3, MSH6, MUTYH, NBN, NF1, NF2, NTHL1, PALB2, PMS2, POLD1, POLE, PTEN, RAD51C, RAD51D, RECQL, RET, SDHA, SDHAF2, SDHB, SDHC, SDHD, SMAD4, SMARCA4, STK11, TP53, TSC1, TSC2, and VHL.  RNA data is routinely analyzed for use in variant interpretation for all genes.   03/02/2021 Cancer Staging   Staging form: Breast, AJCC 8th Edition - Pathologic stage from 03/02/2021: Stage IA (pT2, pN0, cM0, G2, ER+, PR+, HER2-, Oncotype DX score: 26) - Signed by Cynthia Mood, MD on 03/21/2021 Stage prefix: Initial  diagnosis Multigene prognostic tests performed: Oncotype DX Recurrence score range: Greater than or equal to 11 Histologic grading system: 3 grade system Residual tumor (R): R0 - None   05/02/2021 - 07/12/2021 Chemotherapy   Patient is on Treatment Plan : BREAST TC q21d      08/22/2021 Pathology Results   FINAL MICROSCOPIC DIAGNOSIS:   A. BREAST, LEFT, MASTECTOMY:  - Fibrocystic changes.  - Lumpectomy scar with hemosiderin deposition.  - No malignancy identified.   B. BREAST, RIGHT, MASTECTOMY:  - Fibrocystic changes with focal fibroadenomatoid change.  - No malignancy identified.    08/2021 -  Anti-estrogen oral therapy   Exemestane x 6 months, discontinued due to arthralgias and changed to Tamoxifen in 04/2022   07/11/2022 Surgery   BSO with Dr. Pricilla Holm: benign bilateral fallopian tubes and ovaries. (Pt already s/p TAH)       REVIEW OF SYSTEMS:   Constitutional: Denies fevers, chills or abnormal weight loss Eyes: Denies blurriness of vision Ears, nose, mouth, throat, and face: Denies mucositis or sore throat Respiratory: Denies cough, dyspnea or wheezes Cardiovascular: Denies palpitation, chest discomfort or lower extremity swelling Gastrointestinal:  Denies nausea, heartburn or change in bowel habits Skin: Denies abnormal skin rashes. Has moderate to severe itching along the bilateral mastectomy scars Lymphatics: Denies new lymphadenopathy or easy bruising Neurological:Denies numbness, tingling or new weaknesses Behavioral/Psych: [name] is stable, no new changes  All other systems were reviewed with the patient and are negative.   VITALS:   Today's Vitals   05/16/23 1149 05/16/23 1150  BP: 138/68   Pulse: 76   Resp: 17   Temp: 97.8 F (36.6 C)   TempSrc: Oral   SpO2: 98%   Weight: 176 lb (79.8 kg)   PainSc:  5    Body mass index is 33.25 kg/m.   Wt Readings from Last 3 Encounters:  05/16/23 176 lb (79.8 kg)  04/17/23 174 lb 11.2 oz (79.2 kg)  04/11/23 180  lb 4.8 oz (81.8 kg)    Body mass index is 33.25 kg/m.  Performance status (ECOG): 1 - Symptomatic but completely ambulatory  PHYSICAL EXAM:   GENERAL:alert, no distress and comfortable SKIN: skin color, texture, turgor are normal, no rashes or significant  lesions EYES: normal, Conjunctiva are pink and non-injected, sclera clear OROPHARYNX:no exudate, no erythema and lips, buccal mucosa, and tongue normal  NECK: supple, thyroid normal size, non-tender, without nodularity LYMPH:  no palpable lymphadenopathy in the cervical, axillary or inguinal LUNGS: clear to auscultation and percussion with normal breathing effort HEART: regular rate & rhythm and no murmurs and no lower extremity edema ABDOMEN:abdomen soft, non-tender and normal bowel sounds Musculoskeletal:no cyanosis of digits and no clubbing  NEURO: alert & oriented x 3 with fluent speech, no focal motor/sensory deficits BREAST: s/p bilateral mastectomy, multiple surgical scars completely healed. Slightly pink in color. No palpable mass or nodularity on the scars or chest wall. I can appreciate some fibrous tissue along right axillary line adjacent to outer right breast area. No discreet mass or abnormality noted. No axillary lymphadenopathy bilaterally, noted today.    LABORATORY DATA:  I have reviewed the data as listed    Component Value Date/Time   NA 141 04/17/2023 1044   NA 136 02/27/2019 1152   K 4.0 04/17/2023 1044   CL 108 04/17/2023 1044   CO2 28 04/17/2023 1044   GLUCOSE 105 (H) 04/17/2023 1044   BUN 44 (H) 04/17/2023 1044   BUN 19 02/27/2019 1152   CREATININE 1.89 (H) 04/17/2023 1044   CALCIUM 10.0 04/17/2023 1044   PROT 6.2 (L) 04/17/2023 1044   ALBUMIN 4.0 04/17/2023 1044   AST 18 04/17/2023 1044   ALT 25 04/17/2023 1044   ALKPHOS 61 04/17/2023 1044   BILITOT 0.4 04/17/2023 1044   GFRNONAA 31 (L) 04/17/2023 1044   GFRAA >60 06/06/2019 0250    Lab Results  Component Value Date   WBC 6.8 04/17/2023    NEUTROABS 4.5 04/17/2023   HGB 12.6 04/17/2023   HCT 36.4 04/17/2023   MCV 90.3 04/17/2023   PLT 154 04/17/2023    RADIOGRAPHIC STUDIES: I have personally reviewed the radiological images as listed and agreed with the findings in the report. DG Ribs Bilateral  Result Date: 05/16/2023 CLINICAL DATA:  History of breast cancer. Bilateral mastectomies. Rib pain. EXAM: BILATERAL RIBS - 3+ VIEW COMPARISON:  None Available. FINDINGS: No aggressive osseous lesion identified LEFT RIGHT ribs. No pathologic fracture. Surgical clips noted. Lungs are clear. IMPRESSION: No rib abnormality. Electronically Signed   By: Genevive Bi M.D.   On: 05/16/2023 08:23

## 2023-05-15 NOTE — Assessment & Plan Note (Addendum)
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 Ki-67 of 20%. Oncotype showed high risk. -She began adjuvant TC on 05/02/21 after delayed wound healing. She tolerated C1 poorly with nausea with vomiting and diarrhea. She was able to recover well. She experienced cystitis following C2 and C3. She managed to complete 4 cycles, but she has had a slow recovery. -she started exemestane in 08/2021. She has developed worsening joint pains on exemestane and switched to tamoxifen in 04/2022 -continue surveillance, no mammogram s/p bilateral mastectomy. -Cynthia [name] appears stable. Tolerating tamoxifen well overall. She has 2 month h/o tenderness at the mastectomy sites, sporadic ttp to the ribs without palpable abnormality. We reviewed this could be surgical pain vs masked pain from lifting grandchildren, vs other.  -chest/rib Xray was offered, but she had to leave for another appointment. Will come back for Xray at a later date.  -was recommended she hold Tamoxifen to see if this is joint pain from medication.  -If pain resolves, will change to alternative anti-estrogen. If pain does not improve, will likely restart tamoxifen and proceed with further imaging -she did go for x-ray of the ribs on 05/08/2023.  -F/up in 4 weeks

## 2023-05-16 ENCOUNTER — Telehealth: Payer: Self-pay

## 2023-05-16 ENCOUNTER — Encounter: Payer: Self-pay | Admitting: Nurse Practitioner

## 2023-05-16 ENCOUNTER — Inpatient Hospital Stay: Payer: Medicare HMO | Attending: Nurse Practitioner | Admitting: Nurse Practitioner

## 2023-05-16 ENCOUNTER — Ambulatory Visit: Payer: Medicare HMO | Admitting: Psychology

## 2023-05-16 VITALS — BP 138/68 | HR 76 | Temp 97.8°F | Resp 17 | Wt 176.0 lb

## 2023-05-16 DIAGNOSIS — Z170 Estrogen receptor positive status [ER+]: Secondary | ICD-10-CM | POA: Insufficient documentation

## 2023-05-16 DIAGNOSIS — G8918 Other acute postprocedural pain: Secondary | ICD-10-CM | POA: Diagnosis not present

## 2023-05-16 DIAGNOSIS — Z9013 Acquired absence of bilateral breasts and nipples: Secondary | ICD-10-CM | POA: Diagnosis not present

## 2023-05-16 DIAGNOSIS — I890 Lymphedema, not elsewhere classified: Secondary | ICD-10-CM | POA: Insufficient documentation

## 2023-05-16 DIAGNOSIS — Z79899 Other long term (current) drug therapy: Secondary | ICD-10-CM | POA: Diagnosis not present

## 2023-05-16 DIAGNOSIS — C50312 Malignant neoplasm of lower-inner quadrant of left female breast: Secondary | ICD-10-CM | POA: Diagnosis present

## 2023-05-16 MED ORDER — GABAPENTIN 300 MG PO CAPS: 600.0000 mg | ORAL_CAPSULE | Freq: Two times a day (BID) | ORAL | 1 refills | Status: DC

## 2023-05-16 MED ORDER — DICLOFENAC SODIUM 1 % EX GEL: 4.0000 g | Freq: Four times a day (QID) | CUTANEOUS | 1 refills | Status: DC

## 2023-05-16 NOTE — Telephone Encounter (Signed)
Faxed over an order to [facility] [employer] for 2 compressions sleeves for this patient as per Vincent Gros NP. Also faxed demographics and last office note with order. Received fax confirmation of receipt. Attached note for them to contact patient for fitting.

## 2023-05-30 ENCOUNTER — Ambulatory Visit: Payer: Medicare HMO | Admitting: Psychology

## 2023-06-06 ENCOUNTER — Ambulatory Visit: Payer: Medicare HMO | Admitting: Psychology

## 2023-06-13 ENCOUNTER — Ambulatory Visit: Payer: Medicare HMO | Admitting: Psychology

## 2023-06-20 ENCOUNTER — Ambulatory Visit: Payer: Medicare HMO | Admitting: Psychology

## 2023-06-25 ENCOUNTER — Other Ambulatory Visit: Payer: Self-pay

## 2023-06-25 DIAGNOSIS — Z170 Estrogen receptor positive status [ER+]: Secondary | ICD-10-CM

## 2023-06-26 NOTE — Assessment & Plan Note (Deleted)
 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 Ki-67 of 20%. Oncotype showed high risk. -She began adjuvant TC on 05/02/21 after delayed wound healing. She tolerated C1 poorly with nausea with vomiting and diarrhea. She was able to recover well. She experienced cystitis following C2 and C3. She managed to complete 4 cycles, but she has had a slow recovery. -she started exemestane  in 08/2021. She has developed worsening joint pains on exemestane  and switched to tamoxifen  in 04/2022 -continue surveillance, no mammogram s/p bilateral mastectomy. -Cynthia [name] appears stable. Tolerating tamoxifen  well overall. She has 2 month h/o tenderness at the mastectomy sites, sporadic ttp to the ribs without palpable abnormality. We reviewed this could be surgical pain vs masked pain from lifting grandchildren, vs other.  -chest/rib Xray was offered, but she had to leave for another appointment. Will come back for Xray at a later date.  -was recommended she hold Tamoxifen  to see if this is joint pain from medication.  -If pain resolves, will change to alternative anti-estrogen. If pain does not improve, will likely restart tamoxifen  and proceed with further imaging -she did go for x-ray of the ribs on 05/08/2023.  X-ray was negative.  An ultrasound was ordered at her most recent visit.  May need to request results. -F/up in 4 weeks

## 2023-06-26 NOTE — Progress Notes (Deleted)
 Patient Care Team: Leontine Cramp, Cynthia [name] as PCP - General (Nurse Practitioner) Lonni Slain, MD as PCP - Cardiology (Cardiology) Marlee Bernardino NOVAK, MD as Consulting Physician (Nephrology) Melia Lynwood ORN, MD as Referring Physician (Nephrology) Lanny Callander, MD as Consulting Physician (Hematology) Vanderbilt Ned, MD as Consulting Physician (General Surgery) Crawford, Morna Pickle, Cynthia [name] as Nurse Practitioner (Hematology and Oncology) Starla Wendelyn BIRCH, RN as Registered Nurse  Clinic Day:  06/26/2023  Referring physician: Leontine Cramp, Cynthia [name]  ASSESSMENT & PLAN:   Assessment & Plan: Malignant neoplasm of lower-inner quadrant of left breast in female, estrogen receptor positive (HCC)  Stage IA, 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 Ki-67 of 20%. Oncotype showed high risk. -She began adjuvant TC on 05/02/21 after delayed wound healing. She tolerated C1 poorly with nausea with vomiting and diarrhea. She was able to recover well. She experienced cystitis following C2 and C3. She managed to complete 4 cycles, but she has had a slow recovery. -she started exemestane  in 08/2021. She has developed worsening joint pains on exemestane  and switched to tamoxifen  in 04/2022 -continue surveillance, no mammogram s/p bilateral mastectomy. -Ms. Cochrane appears stable. Tolerating tamoxifen  well overall. She has 2 month h/o tenderness at the mastectomy sites, sporadic ttp to the ribs without palpable abnormality. We reviewed this could be surgical pain vs masked pain from lifting grandchildren, vs other.  -chest/rib Xray was offered, but she had to leave for another appointment. Will come back for Xray at a later date.  -was recommended she hold Tamoxifen  to see if this is joint pain from medication.  -If pain resolves, will change to alternative anti-estrogen. If pain does not  improve, will likely restart tamoxifen  and proceed with further imaging -she did go for x-ray of the ribs on 05/08/2023.  X-ray was negative.  An ultrasound was ordered at her most recent visit.  May need to request results. -F/up in 4 weeks      The patient understands the plans discussed today and is in agreement with them.  She knows to contact our office if she develops concerns prior to her next appointment.  I provided *** minutes of face-to-face time during this encounter and > 50% was spent counseling as documented under my assessment and plan.    Cynthia FORBES Lessen, Cynthia [name]  [facility] - A DEPT OF [facility] [address] [place] [address] Dept: [phone] Dept Fax: [phone]   No orders of the defined types were placed in this encounter.     CHIEF COMPLAINT:  CC: Left breast cancer  Current Treatment: Tamoxifen  daily since 11 11/2021.  Was on exemestane  from 08/2021-04/2022.  INTERVAL HISTORY:  Cynthia [name] is here today for repeat clinical assessment.  She has been on 05/15/2023.  She was having pain around the right axillary area, adjacent to the outer right breast tissue.  She thought she noticed a lump in this area.  An ultrasound was ordered of this area for further evaluation.  She denies fevers or chills. She denies pain. Her appetite is good. Her weight {Weight change:10426}.  I have reviewed the past medical history, past surgical history, social history and family history with the patient and they are unchanged from previous note.  ALLERGIES:  is allergic to docetaxel , norvasc  [amlodipine ], and tape.  MEDICATIONS:  Current Outpatient  Medications  Medication Sig Dispense Refill   Accu-Chek Softclix Lancets lancets Use as instructed (Patient taking differently: 1 each by Other route See admin instructions. Use as instructed) 100 each 12   acetaminophen  (TYLENOL ) 500 MG tablet Take 500 mg by mouth  every 6 (six) hours as needed for mild pain, fever or headache.     aspirin  EC 81 MG tablet Take 81 mg by mouth daily.     Blood Glucose Monitoring Suppl (ACCU-CHEK AVIVA CONNECT) w/Device KIT Check sugar 1x daily (Patient taking differently: 1 each by Other route See admin instructions. Check sugar 1x daily) 1 kit 0   carvedilol  (COREG ) 25 MG tablet Take 25 mg by mouth 2 (two) times daily with a meal.     cetirizine (ZYRTEC) 10 MG tablet Take 10 mg by mouth daily as needed for allergies.     cholecalciferol  (VITAMIN D3) 25 MCG (1000 UNIT) tablet Take 1,000 Units by mouth daily.     cyanocobalamin  1000 MCG tablet Take 1,000 mcg by mouth daily.     diclofenac  Sodium (VOLTAREN ) 1 % GEL Apply 4 g topically 4 (four) times daily. 100 g 1   DROPLET PEN NEEDLES 31G X 8 MM MISC      ENVARSUS  XR 1 MG TB24 Take 4 mg by mouth daily.     escitalopram  (LEXAPRO ) 10 MG tablet Take 10 mg by mouth daily.     Fluoxetine  HCl, PMDD, 20 MG TABS Take 20 mg by mouth daily.     furosemide  (LASIX ) 40 MG tablet Take 40 mg by mouth 2 (two) times daily as needed for fluid.     gabapentin  (NEURONTIN ) 300 MG capsule Take 2 capsules (600 mg total) by mouth 2 (two) times daily. 120 capsule 1   Garlic 100 MG TABS Take 100 mg by mouth daily.     glucose blood (ACCU-CHEK AVIVA PLUS) test strip Check sugar 1x daily (Patient taking differently: 1 each by Other route See admin instructions. Check sugar 1x daily) 100 each 12   hydrALAZINE  (APRESOLINE ) 25 MG tablet Take 25 mg by mouth 2 (two) times daily.     insulin  glargine (LANTUS  SOLOSTAR) 100 UNIT/ML Solostar Pen Inject 25-30 Units into the skin See admin instructions. Inject 30 units into the skin in the morning and 25 units at bedtime     Insulin  Pen Needle (PEN NEEDLES) 30G X 8 MM MISC 1 each by Does not apply route daily. E11.9 90 each 0   magnesium  oxide (MAG-OX) 400 MG tablet Take 400 mg by mouth 2 (two) times daily.     multivitamin-lutein (OCUVITE-LUTEIN) CAPS capsule Take  1 capsule by mouth daily.     mycophenolate  (MYFORTIC ) 180 MG EC tablet Take 360 mg by mouth 2 (two) times daily.     oxyCODONE  (OXY IR/ROXICODONE ) 5 MG immediate release tablet Take 1 tablet (5 mg total) by mouth every 6 (six) hours as needed for severe pain. For AFTER surgery only, do not take and drive (Patient taking differently: Take 5 mg by mouth 2 (two) times daily as needed for severe pain (pain score 7-10).) 15 tablet 0   pantoprazole  (PROTONIX ) 40 MG tablet Take 1 tablet (40 mg total) by mouth 2 (two) times daily. 60 tablet 11   predniSONE  (DELTASONE ) 5 MG tablet Take 5 mg by mouth daily.   2   rosuvastatin  (CRESTOR ) 5 MG tablet Take 5 mg by mouth at bedtime.     senna-docusate (SENOKOT-S) 8.6-50 MG tablet Take 2 tablets by  mouth at bedtime. For AFTER surgery, do not take if having diarrhea 30 tablet 0   tamoxifen  (NOLVADEX ) 20 MG tablet TAKE 1 TABLET(20 MG) BY MOUTH DAILY 30 tablet 3   tirzepatide (MOUNJARO) 12.5 MG/0.5ML Pen Inject 12.5 mg into the skin once a week.     No current facility-administered medications for this visit.    HISTORY OF PRESENT ILLNESS:   Oncology History Overview Note  Cancer Staging Malignant neoplasm of lower-inner quadrant of left breast in female, estrogen receptor positive (HCC) Staging form: Breast, AJCC 8th Edition - Clinical stage from 02/09/2021: Stage IA (cT1c, cN0, cM0, G2, ER+, PR+, HER2-) - Signed by Lanny Callander, MD on 02/20/2021 Stage prefix: Initial diagnosis Histologic grading system: 3 grade system - Pathologic stage from 03/02/2021: Stage IA (pT2, pN0, cM0, G2, ER+, PR+, HER2-, Oncotype DX score: 26) - Signed by Lanny Callander, MD on 03/21/2021 Stage prefix: Initial diagnosis Multigene prognostic tests performed: Oncotype DX Recurrence score range: Greater than or equal to 11 Histologic grading system: 3 grade system Residual tumor (R): R0 - None    Malignant neoplasm of lower-inner quadrant of left breast in female, estrogen receptor positive  (HCC)  02/07/2021 Mammogram   Exam: Left Diagnostic Mammogram; Left Breast Ultrasound  IMPRESSION: Irregular mass in left breast at 6:30, measuring 1.6 cm by mammogram, is highly suggestive of malignancy. Left axillary ultrasound is negative for lymphadenopathy.   02/09/2021 Cancer Staging   Staging form: Breast, AJCC 8th Edition - Clinical stage from 02/09/2021: Stage IA (cT1c, cN0, cM0, G2, ER+, PR+, HER2-) - Signed by Lanny Callander, MD on 02/20/2021 Stage prefix: Initial diagnosis Histologic grading system: 3 grade system   02/09/2021 Pathology Results   Diagnosis Breast, left, needle core biopsy, 6:30 o'clock 8cm fn - INVASIVE DUCTAL CARCINOMA WITH HISTIOCYTOID FEATURES. SEE NOTE Diagnosis Note Carcinoma measures 1 cm in greatest linear dimension and appears grade 2.  PROGNOSTIC INDICATORS Results: IMMUNOHISTOCHEMICAL AND MORPHOMETRIC ANALYSIS PERFORMED MANUALLY The tumor cells are EQUIVOCAL for Her2 (2+). Her2 by FISH will be performed and results performed separately. Estrogen Receptor: >95%, POSITIVE, STRONG STAINING INTENSITY Progesterone Receptor: 90%, POSITIVE, STRONG STAINING INTENSITY Proliferation Marker Ki67: 20%  FLUORESCENCE IN-SITU HYBRIDIZATION Results: GROUP 5: HER2 **NEGATIVE** Equivocal form of amplification of the HER2 gene was detected in the IHC 2+ tissue sample received from this individual. HER2 FISH was performed by a technologist and cell imaging and analysis on the [employer]. RATIO OF HER2/CEN17 SIGNALS 1.30 AVERAGE HER2 COPY NUMBER PER CELL 1.75   02/15/2021 Initial Diagnosis   Malignant neoplasm of lower-inner quadrant of left breast in female, estrogen receptor positive (HCC)   03/01/2021 Genetic Testing   Positive genetic testing: pathogenic variant detected in BRCA2 at [id number].  No other pathogenic variants detected in [employer] [facility] Panel.  The report date is 03/19/2021.  The CustomNext-Cancer+RNAinsight panel offered by [employer] includes  sequencing and rearrangement analysis for the following 47 genes:  APC, ATM, AXIN2, BARD1, BMPR1A, BRCA1, BRCA2, BRIP1, CDH1, CDK4, CDKN2A, CHEK2, DICER1, EPCAM, GREM1, HOXB13, MEN1, MLH1, MSH2, MSH3, MSH6, MUTYH, NBN, NF1, NF2, NTHL1, PALB2, PMS2, POLD1, POLE, PTEN, RAD51C, RAD51D, RECQL, RET, SDHA, SDHAF2, SDHB, SDHC, SDHD, SMAD4, SMARCA4, STK11, TP53, TSC1, TSC2, and VHL.  RNA data is routinely analyzed for use in variant interpretation for all genes.   03/02/2021 Cancer Staging   Staging form: Breast, AJCC 8th Edition - Pathologic stage from 03/02/2021: Stage IA (pT2, pN0, cM0, G2, ER+, PR+, HER2-, Oncotype DX score: 26) - Signed by Lanny Callander, MD  on 03/21/2021 Stage prefix: Initial diagnosis Multigene prognostic tests performed: Oncotype DX Recurrence score range: Greater than or equal to 11 Histologic grading system: 3 grade system Residual tumor (R): R0 - None   05/02/2021 - 07/12/2021 Chemotherapy   Patient is on Treatment Plan : BREAST TC q21d      08/22/2021 Pathology Results   FINAL MICROSCOPIC DIAGNOSIS:   A. BREAST, LEFT, MASTECTOMY:  - Fibrocystic changes.  - Lumpectomy scar with hemosiderin deposition.  - No malignancy identified.   B. BREAST, RIGHT, MASTECTOMY:  - Fibrocystic changes with focal fibroadenomatoid change.  - No malignancy identified.    08/2021 -  Anti-estrogen oral therapy   Exemestane  x 6 months, discontinued due to arthralgias and changed to Tamoxifen  in 04/2022   07/11/2022 Surgery   BSO with Dr. Viktoria: benign bilateral fallopian tubes and ovaries. (Pt already s/p TAH)       REVIEW OF SYSTEMS:   Constitutional: Denies fevers, chills or abnormal weight loss Eyes: Denies blurriness of vision Ears, nose, mouth, throat, and face: Denies mucositis or sore throat Respiratory: Denies cough, dyspnea or wheezes Cardiovascular: Denies palpitation, chest discomfort or lower extremity swelling Gastrointestinal:  Denies nausea, heartburn or change in bowel  habits Skin: Denies abnormal skin rashes Lymphatics: Denies new lymphadenopathy or easy bruising Neurological:Denies numbness, tingling or new weaknesses Behavioral/Psych: Mood is stable, no new changes  All other systems were reviewed with the patient and are negative.   VITALS:  There were no vitals taken for this visit.  Wt Readings from Last 3 Encounters:  05/16/23 176 lb (79.8 kg)  04/17/23 174 lb 11.2 oz (79.2 kg)  04/11/23 180 lb 4.8 oz (81.8 kg)    There is no height or weight on file to calculate BMI.  Performance status (ECOG): {CHL ONC [id number]  PHYSICAL EXAM:   GENERAL:alert, no distress and comfortable SKIN: skin color, texture, turgor are normal, no rashes or significant lesions EYES: normal, Conjunctiva are pink and non-injected, sclera clear OROPHARYNX:no exudate, no erythema and lips, buccal mucosa, and tongue normal  NECK: supple, thyroid  normal size, non-tender, without nodularity LYMPH:  no palpable lymphadenopathy in the cervical, axillary or inguinal LUNGS: clear to auscultation and percussion with normal breathing effort HEART: regular rate & rhythm and no murmurs and no lower extremity edema ABDOMEN:abdomen soft, non-tender and normal bowel sounds Musculoskeletal:no cyanosis of digits and no clubbing  NEURO: alert & oriented x 3 with fluent speech, no focal motor/sensory deficits  LABORATORY DATA:  I have reviewed the data as listed    Component Value Date/Time   NA 141 04/17/2023 1044   NA 136 02/27/2019 1152   K 4.0 04/17/2023 1044   CL 108 04/17/2023 1044   CO2 28 04/17/2023 1044   GLUCOSE 105 (H) 04/17/2023 1044   BUN 44 (H) 04/17/2023 1044   BUN 19 02/27/2019 1152   CREATININE 1.89 (H) 04/17/2023 1044   CALCIUM  10.0 04/17/2023 1044   PROT 6.2 (L) 04/17/2023 1044   ALBUMIN  4.0 04/17/2023 1044   AST 18 04/17/2023 1044   ALT 25 04/17/2023 1044   ALKPHOS 61 04/17/2023 1044   BILITOT 0.4 04/17/2023 1044   GFRNONAA 31 (L) 04/17/2023  1044   GFRAA >60 06/06/2019 0250    No results found for: SPEP, UPEP  Lab Results  Component Value Date   WBC 6.8 04/17/2023   NEUTROABS 4.5 04/17/2023   HGB 12.6 04/17/2023   HCT 36.4 04/17/2023   MCV 90.3 04/17/2023   PLT 154 04/17/2023  Chemistry      Component Value Date/Time   NA 141 04/17/2023 1044   NA 136 02/27/2019 1152   K 4.0 04/17/2023 1044   CL 108 04/17/2023 1044   CO2 28 04/17/2023 1044   BUN 44 (H) 04/17/2023 1044   BUN 19 02/27/2019 1152   CREATININE 1.89 (H) 04/17/2023 1044      Component Value Date/Time   CALCIUM  10.0 04/17/2023 1044   ALKPHOS 61 04/17/2023 1044   AST 18 04/17/2023 1044   ALT 25 04/17/2023 1044   BILITOT 0.4 04/17/2023 1044       RADIOGRAPHIC STUDIES: I have personally reviewed the radiological images as listed and agreed with the findings in the report. No results found.

## 2023-06-27 ENCOUNTER — Inpatient Hospital Stay: Payer: Medicare HMO | Attending: Nurse Practitioner | Admitting: Nurse Practitioner

## 2023-06-27 ENCOUNTER — Inpatient Hospital Stay: Payer: Medicare HMO

## 2023-06-27 ENCOUNTER — Telehealth: Payer: Self-pay

## 2023-06-27 DIAGNOSIS — C50312 Malignant neoplasm of lower-inner quadrant of left female breast: Secondary | ICD-10-CM

## 2023-06-27 NOTE — Telephone Encounter (Signed)
 Called patient due to not showing up for her 1030 lab appointment. Patient stated she didn't know she had an appointment today. Will send a message to scheduling to get her rescheduled.

## 2023-07-22 NOTE — Therapy (Signed)
OUTPATIENT PHYSICAL THERAPY FEMALE PELVIC EVALUATION   Patient Name: Cynthia [name] MRN: [id number] DOB:12/20/1970, 53 y.o., female Today's Date: 07/23/2023  END OF SESSION:  PT End of Session - 07/23/23 1227     Visit Number 1    Authorization Type waiting on auth- humana    PT Start Time 1100    PT Stop Time 1205    PT Time Calculation (min) 65 min    Activity Tolerance Patient tolerated treatment well    Behavior During Therapy [facility] for tasks assessed/performed             Past Medical History:  Diagnosis Date   Allergy    pollen   Anemia    Anxiety    BRCA2 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 03/01/2021   Breast cancer [facility])    Cataract    surgical repair bilateral   Complication of anesthesia    not herself for 2 days after last surgery (brest lumpectomy)   Coronary artery disease    Depression    ESRD on hemodialysis (HCC)    Home HD 5x per week- not on dialysis now had tramsplant 4/17   Family history of ovarian cancer 02/22/2021   GERD (gastroesophageal reflux disease)    Hearing loss 2017   right ear   History of blood transfusion    transfusion reaction   Hyperlipidemia    Hypertension    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 per pt-dr levy also II; Per patient 08/18/21 she is Type 2   Kidney transplant recipient    Leukocytosis 06/13/2021   Neuromuscular disorder (HCC)    NEUROPATHY   Sleep apnea    Past Surgical History:  Procedure Laterality Date   ABDOMINAL HYSTERECTOMY     BREAST EXCISIONAL BIOPSY Right 08/2015   BREAST LUMPECTOMY WITH RADIOACTIVE SEED LOCALIZATION Right 09/14/2015   Procedure: RIGHT BREAST LUMPECTOMY WITH RADIOACTIVE SEED LOCALIZATION;  Surgeon: Manus Rudd, MD;  Location: [facility];  Service: General;  Laterality: Right;   BREAST LUMPECTOMY WITH RADIOACTIVE SEED LOCALIZATION Left 03/02/2021   Procedure: LEFT BREAST SEED LUMPECTOMY LEFT SENTINEL LYMPH  NODE MAPPING;  Surgeon: Harriette Bouillon, MD;  Location: [facility];  Service: General;  Laterality: Left;  GEN AND PEC BLOCK   BREAST SURGERY Bilateral    biopsy bilateral   CARDIAC CATHETERIZATION     CATARACT EXTRACTION Bilateral    bilateral   CHOLECYSTECTOMY     CYST REMOVAL NECK     DIALYSIS FISTULA CREATION Left    EYE SURGERY Bilateral    lazer   kidney transplant     LEFT HEART CATH AND CORONARY ANGIOGRAPHY N/A 03/04/2019   Procedure: LEFT HEART CATH AND CORONARY ANGIOGRAPHY;  Surgeon: Swaziland, Peter M, MD;  Location: [facility] INVASIVE [facility];  Service: Cardiovascular;  Laterality: N/A;   LEFT HEART CATHETERIZATION WITH CORONARY ANGIOGRAM N/A 03/30/2014   Procedure: LEFT HEART CATHETERIZATION WITH CORONARY ANGIOGRAM;  Surgeon: Lesleigh Noe, MD;  Location: [facility] CATH LAB;  Service: Cardiovascular;  Laterality: N/A;   LIPOMA EXCISION N/A 02/06/2017   Procedure: EXCISION POSTERIOR NECK SEBACEOUS CYST;  Surgeon: Abigail Miyamoto, MD;  Location: [facility] OR;  Service: General;  Laterality: N/A;   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   Procedure: REPAIR OF ARTERIOVENOUS FISTULA ANEURYSM;  Surgeon: Nada Libman, MD;  Location: [facility] OR;  Service: Vascular;  Laterality: Left;   REVISON OF ARTERIOVENOUS FISTULA Left 09/28/2013  Procedure: EXCISE ESCHAR LEFT ARM  ARTERIOVENOUS FISTULA WITH PLICATION OF LEFT ARM ARTERIOVENOUS FISTULA;  Surgeon: Sherren Kerns, MD;  Location: [facility] OR;  Service: Vascular;  Laterality: Left;   REVISON OF ARTERIOVENOUS FISTULA Left 08/26/2015   Procedure: RESECTION ANEURYSM OF LEFT ARM ARTERIOVENOUS FISTULA  ;  Surgeon: Nada Libman, MD;  Location: [facility] OR;  Service: Vascular;  Laterality: Left;   ROBOTIC ASSISTED BILATERAL SALPINGO OOPHERECTOMY N/A 07/11/2022   Procedure: XI ROBOTIC ASSISTED BILATERAL SALPINGO OOPHORECTOMY;  Surgeon: Carver Fila, MD;  Location: [facility] ORS;  Service: Gynecology;  Laterality: N/A;   SIMPLE  MASTECTOMY WITH AXILLARY SENTINEL NODE BIOPSY Bilateral 08/22/2021   Procedure: BILATERAL SIMPLE MASTECTOMY;  Surgeon: Harriette Bouillon, MD;  Location: [facility] OR;  Service: General;  Laterality: Bilateral;   TUBAL LIGATION     Patient Active Problem List   Diagnosis Date Noted   Gastroesophageal reflux disease without esophagitis 07/28/2022   AKI (acute kidney injury/renal transplant on immunosuppresants  08/24/2021   BRCA2 gene mutation positive s/p bilateral masectomy  08/22/2021   PICC (peripherally inserted central catheter) in place 06/13/2021   Stage 3a chronic kidney disease (CKD) (HCC) 05/28/2021   Thrombocytopenia (HCC) 05/28/2021   Aortic atherosclerosis (HCC) 03/07/2021   Hypercholesterolemia 03/07/2021   Family history of ovarian cancer 02/22/2021   Malignant neoplasm of lower-inner quadrant of left breast in female, estrogen receptor positive (HCC) 02/15/2021   Foot pain, bilateral 07/28/2020   Insulin dependent type 2 diabetes mellitus (HCC) 06/06/2019   DKA (diabetic ketoacidosis) (HCC) 06/02/2019   HTN (hypertension) 03/04/2019   Abnormal nuclear stress test 03/04/2019   Diabetic nephropathy (HCC) 03/03/2019   History of renal transplant 11/18/2015   Diabetic retinopathy associated with type 2 diabetes mellitus (HCC) 04/13/2015   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   Chronic constipation 07/28/2013   Paresthesias 07/28/2013   Diabetes mellitus type I (HCC)    Kidney transplant status 05/28/2013   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/11/2013   Precordial chest pain 02/10/2013   Awaiting organ transplant 09/19/2012    PCP: Loura Back, NP  REFERRING PROVIDER: Crist Fat, MD  REFERRING DIAG: N39.41 (ICD-10-CM) - Urge incontinence  THERAPY DIAG:  Muscle weakness (generalized)  Other muscle spasm  Unspecified lack of coordination  Other low back pain  Muscle spasm of back  Rationale for Evaluation and  Treatment: Rehabilitation  ONSET DATE: 18 months ago  SUBJECTIVE:  SUBJECTIVE STATEMENT:  Using interpreter Rejeana Brock [phone] Pt reports that she is coming here for urge incontinence, has been going on a year and half. Used to come here worked with Annice Pih previously. Pt reports that she has a lot of pain in lower back and shoulder area.  She all of a sudden started feeling an urge and urine leaked They removed her uterus in 2001 because of ulcers Had a kidney transplant 2017 Breast cancer a year ago.  Pt reports that she is feeling very bad, has a lot of symptoms, pain where her ovaries were.    Fluid intake: Yes: coffee and water, 4 glasses of water 16 glasses, soda     PAIN:  Are you having pain? Yes NPRS scale: 5/10 Pain location:  lower abdomen where ovalies used to be  Pain type: aching Pain description: intermittent   Aggravating factors: sweeping and mopping aggravates her low back pain and  Relieving factors: rest  PRECAUTIONS: None  RED FLAGS: None   WEIGHT BEARING RESTRICTIONS: No  FALLS:  Has patient fallen in last 6 months? 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  PLOF: Independent  PATIENT GOALS: to be pain free  PERTINENT HISTORY:  Kidney transplant and several other surgeries - please see above  Sexual abuse: No  BOWEL MOVEMENT: Pain with bowel movement: No Type of bowel movement:Type (Bristol Stool Scale) 3 Fully empty rectum: No Leakage: No Pads: No Fiber supplement: No  URINATION: Pain with urination: No Fully empty bladder: Yes:   Stream: Strong Urgency: Yes:   Frequency: to be asked Leakage: Urge  to void, Walking to the bathroom, Coughing, Sneezing, Laughing, and Exercise Pads: Yes:    INTERCOURSE: n/a  PREGNANCY: Vaginal deliveries 3 Tearing Yes:   C-section deliveries 0 Currently pregnant No  PROLAPSE: None   OBJECTIVE:  Note: Objective measures were completed at Evaluation unless otherwise noted.     COGNITION: Overall cognitive status: Within functional limits for tasks assessed     SENSATION: Light touch: Appears intact Proprioception: Appears intact  MUSCLE LENGTH: Hamstrings: Right 70 deg; Left 70 deg   LUMBAR SPECIAL TESTS:  Straight leg raise test: Positive for tightness   POSTURE: rounded shoulders, forward head, decreased lumbar lordosis, and flexed trunk   PELVIC ALIGNMENT: seems even  LUMBARAROM/PROM: tight and stiff throughout  LOWER EXTREMITY ROM: tight throughout bilateral hips  LOWER EXTREMITY MMT:4/5 grossly throughout  PALPATION:   General  right shoulder limited AROM, painful- abd to max 90 degress Tight and tender abdominal scars- post hysterectomy scar most irritable                External Perineal Exam dry tissues                             Internal Pelvic Floor dry tissues present  Patient confirms identification and approves PT to assess internal pelvic floor and treatment Yes  PELVIC MMT:   MMT eval  Vaginal 3/5  Internal Anal Sphincter   External Anal Sphincter   Puborectalis   Diastasis Recti no  (Blank rows = not tested)        TONE: low  PROLAPSE: Posterior vaginal wall laxity  TODAY'S TREATMENT:  DATE: 07/23/23   EVAL see below  Manual- dry needlin Trigger Point Dry Needling  Initial Treatment: Pt instructed on Dry Needling rational, procedures, and possible side effects. Pt instructed to expect mild to moderate muscle soreness later in the day and/or into the next day.  Pt  instructed in methods to reduce muscle soreness. Pt instructed to continue prescribed HEP. Patient was educated on signs and symptoms of infection and other risk factors and advised to seek medical attention should they occur.  Patient verbalized understanding of these instructions and education.   Patient Verbal Consent Given: Yes Education Handout Provided: Yes Muscles Treated: lumbar paraspinals and bilateral glutes Electrical Stimulation Performed: No Treatment Response/Outcome: reduced pain   PATIENT EDUCATION:  Education details: relevant anatomy, vaginal estrogen, bladder irritants, scar massage Person educated: Patient Education method: Explanation, Demonstration, Tactile cues, Verbal cues, and Handouts Education comprehension: verbalized understanding and needs further education  HOME EXERCISE PROGRAM: [id number]  ASSESSMENT:  CLINICAL IMPRESSION: Patient is a 53 y.o. F who was seen today for physical therapy evaluation and treatment for urinary urgency. She has had some SUI as well. Did well with trial of dry needling and pelvic exam today. Presents with trigger points throughout lumbar paraspinals, stiffness and weakness throughout back and core, decreased pelvic floor strength coordination, tight and tender abdominal scar, right shoulder pain and decreased mobility and flexibility. She will benefit from PT to reduce urgency, improve strength and flexibility and reduce mixed urinary incontinence  Vaginal estrogen?      OBJECTIVE IMPAIRMENTS: decreased activity tolerance, decreased endurance, decreased mobility, difficulty walking, decreased ROM, decreased strength, increased fascial restrictions, increased muscle spasms, impaired flexibility, impaired tone, and pain.   ACTIVITY LIMITATIONS: continence, toileting, and reach over head  PARTICIPATION LIMITATIONS: community activity  PERSONAL FACTORS: Time since onset of injury/illness/exacerbation are also affecting  patient's functional outcome.   REHAB POTENTIAL: Good  CLINICAL DECISION MAKING: Stable/uncomplicated  EVALUATION COMPLEXITY: Moderate   GOALS: Goals reviewed with patient? Yes  SHORT TERM GOALS: Target date: 08/20/2023    Pt will be I with scar massage Baseline: Goal status: INITIAL  2.  Pt will dem initial HEP Baseline:  Goal status: INITIAL  3.  Pt will report reducing mixed incontinence by 50% Baseline:  Goal status: INITIAL  4.  Pt will be I with bladder retraining and healthy bladder habits Baseline:  Goal status: INITIAL   LONG TERM GOALS: Target date: 09/17/2023    Pt will report max 2/10 low back pain Baseline:  Goal status: INITIAL  2.  Pt will soak 0 pads/ day Baseline:  Goal status: INITIAL  3.  Pt will be I with her advanced HEP Baseline:  Goal status: INITIAL  4.  Pt will report 0 pain abdominal scars Baseline:  Goal status: INITIAL  5.  Pt will report complete rectum emptying Baseline:  Goal status: INITIAL   PLAN:  PT FREQUENCY: 1-2x/week  PT DURATION: 8 weeks  PLANNED INTERVENTIONS: 97110-Therapeutic exercises, 97530- Therapeutic activity, 97112- Neuromuscular re-education, 97535- Self Care, [id number]- Manual therapy, Taping, Dry Needling, Joint mobilization, Joint manipulation, Spinal manipulation, Spinal mobilization, and Scar mobilization  PLAN FOR NEXT SESSION: urge drill, exercises, stretches, constipation strategies   Shakura Cowing, PT 07/23/23 12:28 PM

## 2023-07-23 ENCOUNTER — Ambulatory Visit: Payer: Medicare HMO | Attending: Urology | Admitting: Physical Therapy

## 2023-07-23 ENCOUNTER — Other Ambulatory Visit: Payer: Self-pay

## 2023-07-23 ENCOUNTER — Encounter: Payer: Self-pay | Admitting: Physical Therapy

## 2023-07-23 DIAGNOSIS — M62830 Muscle spasm of back: Secondary | ICD-10-CM | POA: Insufficient documentation

## 2023-07-23 DIAGNOSIS — M6281 Muscle weakness (generalized): Secondary | ICD-10-CM | POA: Diagnosis present

## 2023-07-23 DIAGNOSIS — M5459 Other low back pain: Secondary | ICD-10-CM | POA: Diagnosis present

## 2023-07-23 DIAGNOSIS — R279 Unspecified lack of coordination: Secondary | ICD-10-CM | POA: Insufficient documentation

## 2023-07-23 DIAGNOSIS — M62838 Other muscle spasm: Secondary | ICD-10-CM | POA: Insufficient documentation

## 2023-07-30 ENCOUNTER — Encounter: Payer: Self-pay | Admitting: Physical Therapy

## 2023-07-30 ENCOUNTER — Ambulatory Visit: Payer: Medicare HMO | Attending: Urology | Admitting: Physical Therapy

## 2023-07-30 DIAGNOSIS — M62838 Other muscle spasm: Secondary | ICD-10-CM | POA: Diagnosis present

## 2023-07-30 DIAGNOSIS — R293 Abnormal posture: Secondary | ICD-10-CM | POA: Insufficient documentation

## 2023-07-30 DIAGNOSIS — M6281 Muscle weakness (generalized): Secondary | ICD-10-CM | POA: Diagnosis present

## 2023-07-30 DIAGNOSIS — M62830 Muscle spasm of back: Secondary | ICD-10-CM | POA: Diagnosis present

## 2023-07-30 DIAGNOSIS — M5459 Other low back pain: Secondary | ICD-10-CM | POA: Insufficient documentation

## 2023-07-30 DIAGNOSIS — R279 Unspecified lack of coordination: Secondary | ICD-10-CM | POA: Insufficient documentation

## 2023-07-30 NOTE — Therapy (Signed)
 OUTPATIENT PHYSICAL THERAPY FEMALE PELVIC EVALUATION   Patient Name: Cynthia [name] MRN: [id number] DOB:September 16, 1970, 53 y.o., female Today's Date: 07/30/2023  END OF SESSION:  PT End of Session - 07/30/23 1134     Visit Number 2    Authorization Type [employer] approved 10 visits 07/23/23-09/17/23 [id number]    PT Start Time 1100    PT Stop Time 1145    PT Time Calculation (min) 45 min    Activity Tolerance Patient tolerated treatment well    Behavior During Therapy [facility] for tasks assessed/performed              Past Medical History:  Diagnosis Date   Allergy    pollen   Anemia    Anxiety    BRCA2 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 03/01/2021   Breast cancer [facility])    Cataract    surgical repair bilateral   Complication of anesthesia    not herself for 2 days after last surgery (brest lumpectomy)   Coronary artery disease    Depression    ESRD on hemodialysis (HCC)    Home HD 5x per week- not on dialysis now had tramsplant 4/17   Family history of ovarian cancer 02/22/2021   GERD (gastroesophageal reflux disease)    Hearing loss 2017   right ear   History of blood transfusion    transfusion reaction   Hyperlipidemia    Hypertension    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 per pt-dr levy also II; Per patient 08/18/21 she is Type 2   Kidney transplant recipient    Leukocytosis 06/13/2021   Neuromuscular disorder (HCC)    NEUROPATHY   Sleep apnea    Past Surgical History:  Procedure Laterality Date   ABDOMINAL HYSTERECTOMY     BREAST EXCISIONAL BIOPSY Right 08/2015   BREAST LUMPECTOMY WITH RADIOACTIVE SEED LOCALIZATION Right 09/14/2015   Procedure: RIGHT BREAST LUMPECTOMY WITH RADIOACTIVE SEED LOCALIZATION;  Surgeon: Donnice Lima, MD;  Location: [facility];  Service: General;  Laterality: Right;   BREAST LUMPECTOMY WITH RADIOACTIVE SEED LOCALIZATION Left 03/02/2021   Procedure: LEFT BREAST SEED  LUMPECTOMY LEFT SENTINEL LYMPH NODE MAPPING;  Surgeon: Vanderbilt Ned, MD;  Location: [facility];  Service: General;  Laterality: Left;  GEN AND PEC BLOCK   BREAST SURGERY Bilateral    biopsy bilateral   CARDIAC CATHETERIZATION     CATARACT EXTRACTION Bilateral    bilateral   CHOLECYSTECTOMY     CYST REMOVAL NECK     DIALYSIS FISTULA CREATION Left    EYE SURGERY Bilateral    lazer   kidney transplant     LEFT HEART CATH AND CORONARY ANGIOGRAPHY N/A 03/04/2019   Procedure: LEFT HEART CATH AND CORONARY ANGIOGRAPHY;  Surgeon: Jordan, Peter M, MD;  Location: [facility] INVASIVE CV LAB;  Service: Cardiovascular;  Laterality: N/A;   LEFT HEART CATHETERIZATION WITH CORONARY ANGIOGRAM N/A 03/30/2014   Procedure: LEFT HEART CATHETERIZATION WITH CORONARY ANGIOGRAM;  Surgeon: Victory LELON Claudene DOUGLAS, MD;  Location: [facility] CATH LAB;  Service: Cardiovascular;  Laterality: N/A;   LIPOMA EXCISION N/A 02/06/2017   Procedure: EXCISION POSTERIOR NECK SEBACEOUS CYST;  Surgeon: Vernetta Berg, MD;  Location: [facility] OR;  Service: General;  Laterality: N/A;   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   Procedure: REPAIR OF ARTERIOVENOUS FISTULA ANEURYSM;  Surgeon: Gaile LELON New, MD;  Location: [facility] OR;  Service: Vascular;  Laterality: Left;   REVISON OF ARTERIOVENOUS  FISTULA Left 09/28/2013   Procedure: EXCISE ESCHAR LEFT ARM  ARTERIOVENOUS FISTULA WITH PLICATION OF LEFT ARM ARTERIOVENOUS FISTULA;  Surgeon: Carlin FORBES Haddock, MD;  Location[facility];  Service: Vascular;  Laterality: Left;   REVISON OF ARTERIOVENOUS FISTULA Left 08/26/2015   Procedure: RESECTION ANEURYSM OF LEFT ARM ARTERIOVENOUS FISTULA  ;  Surgeon: Gaile LELON New, MD;  Location: [facility] OR;  Service: Vascular;  Laterality: Left;   ROBOTIC ASSISTED BILATERAL SALPINGO OOPHERECTOMY N/A 07/11/2022   Procedure: XI ROBOTIC ASSISTED BILATERAL SALPINGO OOPHORECTOMY;  Surgeon: Viktoria Comer SAUNDERS, MD;  Location: [facility] ORS;  Service: Gynecology;   Laterality: N/A;   SIMPLE MASTECTOMY WITH AXILLARY SENTINEL NODE BIOPSY Bilateral 08/22/2021   Procedure: BILATERAL SIMPLE MASTECTOMY;  Surgeon: Vanderbilt Ned, MD;  Location: [facility] OR;  Service: General;  Laterality: Bilateral;   TUBAL LIGATION     Patient Active Problem List   Diagnosis Date Noted   Gastroesophageal reflux disease without esophagitis 07/28/2022   AKI (acute kidney injury/renal transplant on immunosuppresants  08/24/2021   BRCA2 gene mutation positive s/p bilateral masectomy  08/22/2021   PICC (peripherally inserted central catheter) in place 06/13/2021   Stage 3a chronic kidney disease (CKD) (HCC) 05/28/2021   Thrombocytopenia (HCC) 05/28/2021   Aortic atherosclerosis (HCC) 03/07/2021   Hypercholesterolemia 03/07/2021   Family history of ovarian cancer 02/22/2021   Malignant neoplasm of lower-inner quadrant of left breast in female, estrogen receptor positive (HCC) 02/15/2021   Foot pain, bilateral 07/28/2020   Insulin  dependent type 2 diabetes mellitus (HCC) 06/06/2019   DKA (diabetic ketoacidosis) (HCC) 06/02/2019   HTN (hypertension) 03/04/2019   Abnormal nuclear stress test 03/04/2019   Diabetic nephropathy (HCC) 03/03/2019   History of renal transplant 11/18/2015   Diabetic retinopathy associated with type 2 diabetes mellitus (HCC) 04/13/2015   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   Chronic constipation 07/28/2013   Paresthesias 07/28/2013   Diabetes mellitus type I [facility])    Kidney transplant status 05/28/2013   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/11/2013   Precordial chest pain 02/10/2013   Awaiting organ transplant 09/19/2012    PCP: Leontine Cramp, NP  REFERRING PROVIDER: Cam Morene LELON, MD  REFERRING DIAG: N39.41 (ICD-10-CM) - Urge incontinence  THERAPY DIAG:  Muscle weakness (generalized)  Rationale for Evaluation and Treatment: Rehabilitation  ONSET DATE: 18 months ago  SUBJECTIVE:                                                                                                                                                                                            SUBJECTIVE STATEMENT: Interpreter Mia present Pt reports that she feels  better. Forgot to call about her shoulder Pt reports that her leaking when she holds her pee and needs to pee and has the urge. Sometimes 3 hours and sometimes 15 mins Sometimes she does not feel like she empties her bladder Also with coughing and laughing and jumping. Drinks 4 16 oz water .  Lost her HEP Pt reports that she gets dizzy when she stands up from sitting, her back cracks as well. Her BP is at times 133. Machine tells her to rest, so she lies down.      Fluid intake: Yes: coffee and water , 4 glasses of water  16 glasses, soda     PAIN:  Are you having pain? Yes NPRS scale: 5/10 Pain location:  lower abdomen where ovalies used to be  Pain type: aching Pain description: intermittent   Aggravating factors: sweeping and mopping aggravates her low back pain and  Relieving factors: rest  PRECAUTIONS: None  RED FLAGS: None   WEIGHT BEARING RESTRICTIONS: No  FALLS:  Has patient fallen in last 6 months? 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  PLOF: Independent  PATIENT GOALS: to be pain free  PERTINENT HISTORY:  Kidney transplant and several other surgeries - please see above  Sexual abuse: No  BOWEL MOVEMENT: Pain with bowel movement: No Type of bowel movement:Type (Bristol Stool Scale) 3 Fully empty rectum: No Leakage: No Pads: No Fiber supplement: No  URINATION: Pain with urination: No Fully empty bladder: Yes:   Stream: Strong Urgency: Yes:   Frequency: to be asked Leakage: Urge to void, Walking to the bathroom, Coughing, Sneezing, Laughing, and  Exercise Pads: Yes:    INTERCOURSE: n/a  PREGNANCY: Vaginal deliveries 3 Tearing Yes:   C-section deliveries 0 Currently pregnant No  PROLAPSE: None   OBJECTIVE:  Note: Objective measures were completed at Evaluation unless otherwise noted.     COGNITION: Overall cognitive status: Within functional limits for tasks assessed     SENSATION: Light touch: Appears intact Proprioception: Appears intact  MUSCLE LENGTH: Hamstrings: Right 70 deg; Left 70 deg   LUMBAR SPECIAL TESTS:  Straight leg raise test: Positive for tightness   POSTURE: rounded shoulders, forward head, decreased lumbar lordosis, and flexed trunk   PELVIC ALIGNMENT: seems even  LUMBARAROM/PROM: tight and stiff throughout  LOWER EXTREMITY ROM: tight throughout bilateral hips  LOWER EXTREMITY MMT:4/5 grossly throughout  PALPATION:   General  right shoulder limited AROM, painful- abd to max 90 degress Tight and tender abdominal scars- post hysterectomy scar most irritable                External Perineal Exam dry tissues                             Internal Pelvic Floor dry tissues present  Patient confirms identification and approves PT to assess internal pelvic floor and treatment Yes  PELVIC MMT:   MMT eval  Vaginal 3/5  Internal Anal Sphincter   External Anal Sphincter   Puborectalis   Diastasis Recti no  (Blank rows = not tested)        TONE: low  PROLAPSE: Posterior vaginal wall laxity  TODAY'S TREATMENT:  DATE: 07/30/23     Neuro reed- lumbar rotations Ball press supine with transverse abdominis breath and PF lift  Urge drill Bladder irritants Ankle pumps before standing up to prevent orthostatic BP drop   Trigger Point Dry Needling  Initial Treatment: Pt instructed on Dry Needling rational, procedures, and possible side effects. Pt instructed  to expect mild to moderate muscle soreness later in the day and/or into the next day.  Pt instructed in methods to reduce muscle soreness. Pt instructed to continue prescribed HEP. Patient was educated on signs and symptoms of infection and other risk factors and advised to seek medical attention should they occur.  Patient verbalized understanding of these instructions and education.   Patient Verbal Consent Given: Yes Education Handout Provided: Yes Muscles Treated: lumbar paraspinals and bilateral glutes Electrical Stimulation Performed: No Treatment Response/Outcome: reduced pain   PATIENT EDUCATION:  Education details: relevant anatomy, vaginal estrogen, bladder irritants, scar massage Person educated: Patient Education method: Explanation, Demonstration, Tactile cues, Verbal cues, and Handouts Education comprehension: verbalized understanding and needs further education  HOME EXERCISE PROGRAM: [id number]  ASSESSMENT:  CLINICAL IMPRESSION: Pt did fairly well well with her exercises. Interpreter present. Pt to be more consistent with her HEP and drink water  only for 1 more week to reduce urgency. Pt at times pees every 3 hours and at times every 15 mins.  Pt tired today reported that she sleeps ok at night but wakes up tired.  Pt with some lumbar spine pain and bilateral hip pain that limits her movements and is making her exercises and walking upstairs difficult. Afraid to move. Tender spots bilateral PSIS. She will benefit from cont PT, will get a referral for cancer PT again.  Seems tired and decondiitoned Will cont to benefit from PT        OBJECTIVE IMPAIRMENTS: decreased activity tolerance, decreased endurance, decreased mobility, difficulty walking, decreased ROM, decreased strength, increased fascial restrictions, increased muscle spasms, impaired flexibility, impaired tone, and pain.   ACTIVITY LIMITATIONS: continence, toileting, and reach over head  PARTICIPATION  LIMITATIONS: community activity  PERSONAL FACTORS: Time since onset of injury/illness/exacerbation are also affecting patient's functional outcome.   REHAB POTENTIAL: Good  CLINICAL DECISION MAKING: Stable/uncomplicated  EVALUATION COMPLEXITY: Moderate   GOALS: Goals reviewed with patient? Yes  SHORT TERM GOALS: Target date: 08/20/2023    Pt will be I with scar massage Baseline: Goal status: INITIAL  2.  Pt will dem initial HEP Baseline:  Goal status: INITIAL  3.  Pt will report reducing mixed incontinence by 50% Baseline:  Goal status: INITIAL  4.  Pt will be I with bladder retraining and healthy bladder habits Baseline:  Goal status: INITIAL   LONG TERM GOALS: Target date: 09/17/2023    Pt will report max 2/10 low back pain Baseline:  Goal status: INITIAL  2.  Pt will soak 0 pads/ day Baseline:  Goal status: INITIAL  3.  Pt will be I with her advanced HEP Baseline:  Goal status: INITIAL  4.  Pt will report 0 pain abdominal scars Baseline:  Goal status: INITIAL  5.  Pt will report complete rectum emptying Baseline:  Goal status: INITIAL   PLAN:  PT FREQUENCY: 1-2x/week  PT DURATION: 8 weeks  PLANNED INTERVENTIONS: 97110-Therapeutic exercises, 97530- Therapeutic activity, 97112- Neuromuscular re-education, 97535- Self Care, [id number]- Manual therapy, Taping, Dry Needling, Joint mobilization, Joint manipulation, Spinal manipulation, Spinal mobilization, and Scar mobilization  PLAN FOR NEXT SESSION: urge drill- review and make sure she understands exercises,  stretches, constipation strategies   Euna Armon, PT 07/30/23 11:39 AM

## 2023-08-02 ENCOUNTER — Other Ambulatory Visit: Payer: Self-pay | Admitting: Gastroenterology

## 2023-08-06 ENCOUNTER — Ambulatory Visit: Payer: Medicare HMO | Admitting: Physical Therapy

## 2023-08-13 ENCOUNTER — Ambulatory Visit: Payer: Medicare HMO | Admitting: Physical Therapy

## 2023-08-13 ENCOUNTER — Encounter: Payer: Self-pay | Admitting: Physical Therapy

## 2023-08-13 DIAGNOSIS — M62838 Other muscle spasm: Secondary | ICD-10-CM

## 2023-08-13 DIAGNOSIS — R279 Unspecified lack of coordination: Secondary | ICD-10-CM

## 2023-08-13 DIAGNOSIS — M62830 Muscle spasm of back: Secondary | ICD-10-CM

## 2023-08-13 DIAGNOSIS — M6281 Muscle weakness (generalized): Secondary | ICD-10-CM

## 2023-08-13 DIAGNOSIS — M5459 Other low back pain: Secondary | ICD-10-CM

## 2023-08-13 DIAGNOSIS — R293 Abnormal posture: Secondary | ICD-10-CM

## 2023-08-13 NOTE — Therapy (Signed)
OUTPATIENT PHYSICAL THERAPY FEMALE PELVIC EVALUATION   Patient Name: Cynthia [name] MRN: [id number] DOB:Jun 29, 1970, 53 y.o., female Today's Date: 08/13/2023  END OF SESSION:  PT End of Session - 08/13/23 1204     Visit Number 3    Authorization Type [employer] approved 10 visits 07/23/23-09/17/23 [id number]    PT Start Time 1100    PT Stop Time 1155    PT Time Calculation (min) 55 min    Activity Tolerance Patient limited by fatigue;Patient limited by lethargy;Patient limited by pain    Behavior During Therapy [facility] for tasks assessed/performed               Past Medical History:  Diagnosis Date   Allergy    pollen   Anemia    Anxiety    BRCA2 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 03/01/2021   Breast cancer [facility])    Cataract    surgical repair bilateral   Complication of anesthesia    not herself for 2 days after last surgery (brest lumpectomy)   Coronary artery disease    Depression    ESRD on hemodialysis (HCC)    Home HD 5x per week- not on dialysis now had tramsplant 4/17   Family history of ovarian cancer 02/22/2021   GERD (gastroesophageal reflux disease)    Hearing loss 2017   right ear   History of blood transfusion    transfusion reaction   Hyperlipidemia    Hypertension    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 per pt-dr levy also II; Per patient 08/18/21 she is Type 2   Kidney transplant recipient    Leukocytosis 06/13/2021   Neuromuscular disorder (HCC)    NEUROPATHY   Sleep apnea    Past Surgical History:  Procedure Laterality Date   ABDOMINAL HYSTERECTOMY     BREAST EXCISIONAL BIOPSY Right 08/2015   BREAST LUMPECTOMY WITH RADIOACTIVE SEED LOCALIZATION Right 09/14/2015   Procedure: RIGHT BREAST LUMPECTOMY WITH RADIOACTIVE SEED LOCALIZATION;  Surgeon: Manus Rudd, MD;  Location: [facility];  Service: General;  Laterality: Right;   BREAST LUMPECTOMY WITH RADIOACTIVE SEED  LOCALIZATION Left 03/02/2021   Procedure: LEFT BREAST SEED LUMPECTOMY LEFT SENTINEL LYMPH NODE MAPPING;  Surgeon: Harriette Bouillon, MD;  Location: [facility];  Service: General;  Laterality: Left;  GEN AND PEC BLOCK   BREAST SURGERY Bilateral    biopsy bilateral   CARDIAC CATHETERIZATION     CATARACT EXTRACTION Bilateral    bilateral   CHOLECYSTECTOMY     CYST REMOVAL NECK     DIALYSIS FISTULA CREATION Left    EYE SURGERY Bilateral    lazer   kidney transplant     LEFT HEART CATH AND CORONARY ANGIOGRAPHY N/A 03/04/2019   Procedure: LEFT HEART CATH AND CORONARY ANGIOGRAPHY;  Surgeon: Swaziland, Peter M, MD;  Location: [facility]ASIVE [facility];  Service: Cardiovascular;  Laterality: N/A;   LEFT HEART CATHETERIZATION WITH CORONARY ANGIOGRAM N/A 03/30/2014   Procedure: LEFT HEART CATHETERIZATION WITH CORONARY ANGIOGRAM;  Surgeon: Lesleigh Noe, MD;  Location: [facility] CATH LAB;  Service: Cardiovascular;  Laterality: N/A;   LIPOMA EXCISION N/A 02/06/2017   Procedure: EXCISION POSTERIOR NECK SEBACEOUS CYST;  Surgeon: Abigail Miyamoto, MD;  Location: [facility] OR;  Service: General;  Laterality: N/A;   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   Procedure: REPAIR OF ARTERIOVENOUS FISTULA ANEURYSM;  Surgeon: Nada Libman, MD;  Location: [facility] OR;  Service: Vascular;  Laterality: Left;   REVISON OF ARTERIOVENOUS FISTULA Left 09/28/2013   Procedure: EXCISE ESCHAR LEFT ARM  ARTERIOVENOUS FISTULA WITH PLICATION OF LEFT ARM ARTERIOVENOUS FISTULA;  Surgeon: Sherren Kerns, MD;  Location: [facility] OR;  Service: Vascular;  Laterality: Left;   REVISON OF ARTERIOVENOUS FISTULA Left 08/26/2015   Procedure: RESECTION ANEURYSM OF LEFT ARM ARTERIOVENOUS FISTULA  ;  Surgeon: Nada Libman, MD;  Location: [facility] OR;  Service: Vascular;  Laterality: Left;   ROBOTIC ASSISTED BILATERAL SALPINGO OOPHERECTOMY N/A 07/11/2022   Procedure: XI ROBOTIC ASSISTED BILATERAL SALPINGO OOPHORECTOMY;  Surgeon: Carver Fila, MD;  Location: [facility] ORS;  Service: Gynecology;  Laterality: N/A;   SIMPLE MASTECTOMY WITH AXILLARY SENTINEL NODE BIOPSY Bilateral 08/22/2021   Procedure: BILATERAL SIMPLE MASTECTOMY;  Surgeon: Harriette Bouillon, MD;  Location: [facility] OR;  Service: General;  Laterality: Bilateral;   TUBAL LIGATION     Patient Active Problem List   Diagnosis Date Noted   Gastroesophageal reflux disease without esophagitis 07/28/2022   AKI (acute kidney injury/renal transplant on immunosuppresants  08/24/2021   BRCA2 gene mutation positive s/p bilateral masectomy  08/22/2021   PICC (peripherally inserted central catheter) in place 06/13/2021   Stage 3a chronic kidney disease (CKD) (HCC) 05/28/2021   Thrombocytopenia (HCC) 05/28/2021   Aortic atherosclerosis (HCC) 03/07/2021   Hypercholesterolemia 03/07/2021   Family history of ovarian cancer 02/22/2021   Malignant neoplasm of lower-inner quadrant of left breast in female, estrogen receptor positive (HCC) 02/15/2021   Foot pain, bilateral 07/28/2020   Insulin dependent type 2 diabetes mellitus (HCC) 06/06/2019   DKA (diabetic ketoacidosis) (HCC) 06/02/2019   HTN (hypertension) 03/04/2019   Abnormal nuclear stress test 03/04/2019   Diabetic nephropathy (HCC) 03/03/2019   History of renal transplant 11/18/2015   Diabetic retinopathy associated with type 2 diabetes mellitus (HCC) 04/13/2015   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   Chronic constipation 07/28/2013   Paresthesias 07/28/2013   Diabetes mellitus type I (HCC)    Kidney transplant status 05/28/2013   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/11/2013   Precordial chest pain 02/10/2013   Awaiting organ transplant 09/19/2012    PCP: Loura Back, NP  REFERRING PROVIDER: Crist Fat, MD  REFERRING DIAG: N39.41 (ICD-10-CM) - Urge incontinence  THERAPY DIAG:  Muscle weakness (generalized)  Other muscle spasm  Unspecified lack of  coordination  Muscle spasm of back  Abnormal posture  Other low back pain  Rationale for Evaluation and Treatment: Rehabilitation  ONSET DATE: 18 months ago  SUBJECTIVE:  SUBJECTIVE STATEMENT: Interpreter Muriel present Right shoulder pain, bringing referral for occupational therapy Pt feels better than last time Exercises are helping Pt reports that she noticed when she has the urge, she goes to the bathroom, she empties her bladder.  Reports that she rocks to help bladder completely Has been drinking water and tea, she thinks it is helping her urgency.     Fluid intake: Yes: coffee and water, 4 glasses of water 16 glasses, soda     PAIN:  Are you having pain? Yes NPRS scale: 5/10 Pain location:  lower abdomen where ovalies used to be  Pain type: aching Pain description: intermittent   Aggravating factors: sweeping and mopping aggravates her low back pain and  Relieving factors: rest  PRECAUTIONS: None  RED FLAGS: None   WEIGHT BEARING RESTRICTIONS: No  FALLS:  Has patient fallen in last 6 months? 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  PLOF: Independent  PATIENT GOALS: to be pain free  PERTINENT HISTORY:  Kidney transplant and several other surgeries - please see above  Sexual abuse: No  BOWEL MOVEMENT: Pain with bowel movement: No Type of bowel movement:Type (Bristol Stool Scale) 3 Fully empty rectum: No Leakage: No Pads: No Fiber supplement: No  URINATION: Pain with urination: No Fully empty bladder: Yes:   Stream: Strong Urgency: Yes:   Frequency: to be asked Leakage: Urge to void, Walking to the  bathroom, Coughing, Sneezing, Laughing, and Exercise Pads: Yes:    INTERCOURSE: n/a  PREGNANCY: Vaginal deliveries 3 Tearing Yes:   C-section deliveries 0 Currently pregnant No  PROLAPSE: None   OBJECTIVE:  Note: Objective measures were completed at Evaluation unless otherwise noted.     COGNITION: Overall cognitive status: Within functional limits for tasks assessed     SENSATION: Light touch: Appears intact Proprioception: Appears intact  MUSCLE LENGTH: Hamstrings: Right 70 deg; Left 70 deg   LUMBAR SPECIAL TESTS:  Straight leg raise test: Positive for tightness   POSTURE: rounded shoulders, forward head, decreased lumbar lordosis, and flexed trunk   PELVIC ALIGNMENT: seems even  LUMBARAROM/PROM: tight and stiff throughout  LOWER EXTREMITY ROM: tight throughout bilateral hips  LOWER EXTREMITY MMT:4/5 grossly throughout  PALPATION:   General  right shoulder limited AROM, painful- abd to max 90 degress Tight and tender abdominal scars- post hysterectomy scar most irritable                External Perineal Exam dry tissues                             Internal Pelvic Floor dry tissues present  Patient confirms identification and approves PT to assess internal pelvic floor and treatment Yes  PELVIC MMT:   MMT eval  Vaginal 3/5  Internal Anal Sphincter   External Anal Sphincter   Puborectalis   Diastasis Recti no  (Blank rows = not tested)        TONE: low  PROLAPSE: Posterior vaginal wall laxity  TODAY'S TREATMENT:  DATE: 08/13/23    There acts- urge suppression techniques reed There ex- shoulder PROM- flexion, IR in standing and flexion in sitting on pulleys  Urge drill Bladder irritants Ankle pumps before standing up to prevent orthostatic BP drop   PATIENT EDUCATION:  Education details: relevant anatomy,  vaginal estrogen, bladder irritants, scar massage Person educated: Patient Education method: Explanation, Demonstration, Tactile cues, Verbal cues, and Handouts Education comprehension: verbalized understanding and needs further education  HOME EXERCISE PROGRAM: [id number] ASSESSMENT:  CLINICAL IMPRESSION: Pt with increased right shoulder pain and fatigued today. Did well with education on urge drill, hydration and urge suppression strategies. Tried over the door pulleys, discussed getting some for home to encourage PROM right shoulder before coming to ortho/ cancer PT.   OBJECTIVE IMPAIRMENTS: decreased activity tolerance, decreased endurance, decreased mobility, difficulty walking, decreased ROM, decreased strength, increased fascial restrictions, increased muscle spasms, impaired flexibility, impaired tone, and pain.   ACTIVITY LIMITATIONS: continence, toileting, and reach over head  PARTICIPATION LIMITATIONS: community activity  PERSONAL FACTORS: Time since onset of injury/illness/exacerbation are also affecting patient's functional outcome.   REHAB POTENTIAL: Good  CLINICAL DECISION MAKING: Stable/uncomplicated  EVALUATION COMPLEXITY: Moderate   GOALS: Goals reviewed with patient? Yes  SHORT TERM GOALS: Target date: 08/20/2023    Pt will be I with scar massage Baseline: Goal status: INITIAL  2.  Pt will dem initial HEP Baseline:  Goal status: INITIAL  3.  Pt will report reducing mixed incontinence by 50% Baseline:  Goal status: INITIAL  4.  Pt will be I with bladder retraining and healthy bladder habits Baseline:  Goal status: INITIAL   LONG TERM GOALS: Target date: 09/17/2023    Pt will report max 2/10 low back pain Baseline:  Goal status: INITIAL  2.  Pt will soak 0 pads/ day Baseline:  Goal status: INITIAL  3.  Pt will be I with her advanced HEP Baseline:  Goal status: INITIAL  4.  Pt will report 0 pain abdominal scars Baseline:  Goal  status: INITIAL  5.  Pt will report complete rectum emptying Baseline:  Goal status: INITIAL   PLAN:  PT FREQUENCY: 1-2x/week  PT DURATION: 8 weeks  PLANNED INTERVENTIONS: 97110-Therapeutic exercises, 97530- Therapeutic activity, 97112- Neuromuscular re-education, 97535- Self Care, [id number]- Manual therapy, Taping, Dry Needling, Joint mobilization, Joint manipulation, Spinal manipulation, Spinal mobilization, and Scar mobilization  PLAN FOR NEXT SESSION: urge drill- review and make sure she understands exercises, stretches, constipation strategies   Merriel Zinger, PT 08/13/23 12:05 PM

## 2023-08-20 ENCOUNTER — Encounter: Payer: Self-pay | Admitting: Physical Therapy

## 2023-08-20 ENCOUNTER — Ambulatory Visit: Payer: Medicare HMO | Admitting: Physical Therapy

## 2023-08-20 DIAGNOSIS — R279 Unspecified lack of coordination: Secondary | ICD-10-CM

## 2023-08-20 DIAGNOSIS — M6281 Muscle weakness (generalized): Secondary | ICD-10-CM | POA: Diagnosis not present

## 2023-08-20 NOTE — Therapy (Signed)
 OUTPATIENT PHYSICAL THERAPY FEMALE PELVIC EVALUATION   Patient Name: Cynthia [name] MRN: [id number] DOB:[birth year], 53 y.o., female Today's Date: 08/20/2023  END OF SESSION:  PT End of Session - 08/20/23 1154     Visit Number 4    Authorization Type [employer] approved 10 visits 07/23/23-09/17/23 [id number]    PT Start Time 1100    PT Stop Time 1152    PT Time Calculation (min) 52 min    Activity Tolerance Patient limited by fatigue;Patient limited by lethargy;Patient limited by pain    Behavior During Therapy [facility] for tasks assessed/performed                Past Medical History:  Diagnosis Date   Allergy    pollen   Anemia    Anxiety    BRCA2 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 03/01/2021   Breast cancer [facility])    Cataract    surgical repair bilateral   Complication of anesthesia    not herself for 2 days after last surgery (brest lumpectomy)   Coronary artery disease    Depression    ESRD on hemodialysis (HCC)    Home HD 5x per week- not on dialysis now had tramsplant 4/17   Family history of ovarian cancer 02/22/2021   GERD (gastroesophageal reflux disease)    Hearing loss 2017   right ear   History of blood transfusion    transfusion reaction   Hyperlipidemia    Hypertension    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 per pt-dr levy also II; Per patient 08/18/21 she is Type 2   Kidney transplant recipient    Leukocytosis 06/13/2021   Neuromuscular disorder (HCC)    NEUROPATHY   Sleep apnea    Past Surgical History:  Procedure Laterality Date   ABDOMINAL HYSTERECTOMY     BREAST EXCISIONAL BIOPSY Right 08/2015   BREAST LUMPECTOMY WITH RADIOACTIVE SEED LOCALIZATION Right 09/14/2015   Procedure: RIGHT BREAST LUMPECTOMY WITH RADIOACTIVE SEED LOCALIZATION;  Surgeon: Manus Rudd, MD;  Location: [facility];  Service: General;  Laterality: Right;   BREAST LUMPECTOMY WITH RADIOACTIVE SEED  LOCALIZATION Left 03/02/2021   Procedure: LEFT BREAST SEED LUMPECTOMY LEFT SENTINEL LYMPH NODE MAPPING;  Surgeon: Harriette Bouillon, MD;  Location: [facility];  Service: General;  Laterality: Left;  GEN AND PEC BLOCK   BREAST SURGERY Bilateral    biopsy bilateral   CARDIAC CATHETERIZATION     CATARACT EXTRACTION Bilateral    bilateral   CHOLECYSTECTOMY     CYST REMOVAL NECK     DIALYSIS FISTULA CREATION Left    EYE SURGERY Bilateral    lazer   kidney transplant     LEFT HEART CATH AND CORONARY ANGIOGRAPHY N/A 03/04/2019   Procedure: LEFT HEART CATH AND CORONARY ANGIOGRAPHY;  Surgeon: Swaziland, Peter M, MD;  Location: [facility]AS[facility];  Service: Cardiovascular;  Laterality: N/A;   LEFT HEART CATHETERIZATION WITH CORONARY ANGIOGRAM N/A 03/30/2014   Procedure: LEFT HEART CATHETERIZATION WITH CORONARY ANGIOGRAM;  Surgeon: Lesleigh Noe, MD;  Location: [facility] CATH LAB;  Service: Cardiovascular;  Laterality: N/A;   LIPOMA EXCISION N/A 02/06/2017   Procedure: EXCISION POSTERIOR NECK SEBACEOUS CYST;  Surgeon: Abigail Miyamoto, MD;  Location: [facility] OR;  Service: General;  Laterality: N/A;   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   Procedure: REPAIR OF ARTERIOVENOUS FISTULA ANEURYSM;  Surgeon: Nada Libman, MD;  Location: [facility] OR;  Service: Vascular;  Laterality: Left;   REVISON OF ARTERIOVENOUS FISTULA Left 09/28/2013   Procedure: EXCISE ESCHAR LEFT ARM  ARTERIOVENOUS FISTULA WITH PLICATION OF LEFT ARM ARTERIOVENOUS FISTULA;  Surgeon: Sherren Kerns, MD;  Location: [facility] OR;  Service: Vascular;  Laterality: Left;   REVISON OF ARTERIOVENOUS FISTULA Left 08/26/2015   Procedure: RESECTION ANEURYSM OF LEFT ARM ARTERIOVENOUS FISTULA  ;  Surgeon: Nada Libman, MD;  Location: [facility] OR;  Service: Vascular;  Laterality: Left;   ROBOTIC ASSISTED BILATERAL SALPINGO OOPHERECTOMY N/A 07/11/2022   Procedure: XI ROBOTIC ASSISTED BILATERAL SALPINGO OOPHORECTOMY;  Surgeon: Carver Fila, MD;  Location: [facility] ORS;  Service: Gynecology;  Laterality: N/A;   SIMPLE MASTECTOMY WITH AXILLARY SENTINEL NODE BIOPSY Bilateral 08/22/2021   Procedure: BILATERAL SIMPLE MASTECTOMY;  Surgeon: Harriette Bouillon, MD;  Location: [facility] OR;  Service: General;  Laterality: Bilateral;   TUBAL LIGATION     Patient Active Problem List   Diagnosis Date Noted   Gastroesophageal reflux disease without esophagitis 07/28/2022   AKI (acute kidney injury/renal transplant on immunosuppresants  08/24/2021   BRCA2 gene mutation positive s/p bilateral masectomy  08/22/2021   PICC (peripherally inserted central catheter) in place 06/13/2021   Stage 3a chronic kidney disease (CKD) (HCC) 05/28/2021   Thrombocytopenia (HCC) 05/28/2021   Aortic atherosclerosis (HCC) 03/07/2021   Hypercholesterolemia 03/07/2021   Family history of ovarian cancer 02/22/2021   Malignant neoplasm of lower-inner quadrant of left breast in female, estrogen receptor positive (HCC) 02/15/2021   Foot pain, bilateral 07/28/2020   Insulin dependent type 2 diabetes mellitus (HCC) 06/06/2019   DKA (diabetic ketoacidosis) (HCC) 06/02/2019   HTN (hypertension) 03/04/2019   Abnormal nuclear stress test 03/04/2019   Diabetic nephropathy (HCC) 03/03/2019   History of renal transplant 11/18/2015   Diabetic retinopathy associated with type 2 diabetes mellitus (HCC) 04/13/2015   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   Chronic constipation 07/28/2013   Paresthesias 07/28/2013   Diabetes mellitus type I [facility])    Kidney transplant status 05/28/2013   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/11/2013   Precordial chest pain 02/10/2013   Awaiting organ transplant 09/19/2012    PCP: Loura Back, NP  REFERRING PROVIDER: Crist Fat, MD  REFERRING DIAG: N39.41 (ICD-10-CM) - Urge incontinence  THERAPY DIAG:  Muscle weakness (generalized)  Unspecified lack of coordination  Rationale for  Evaluation and Treatment: Rehabilitation  ONSET DATE: 18 months ago  SUBJECTIVE:  SUBJECTIVE STATEMENT: Interpreter Al[place] present Right shoulder pain 7/10 , pain wakes her up, is increasing, not sure if they sent the referral Pt wants to focus on her shoulder. D/T increased pain today Pt reports that urge is the same Drinks water and coffee Does not miss soda Leaking when she bends over and when she is carrying her grandson or something heavy Doing exercises- mostly shoulder stretches     Fluid intake: Yes: coffee and water, 4 glasses of water 16 glasses, soda     PAIN:  Are you having pain? Yes NPRS scale: 5/10 Pain location:  lower abdomen where ovaries used to be  Pain type: aching Pain description: intermittent   Aggravating factors: sweeping and mopping aggravates her low back pain and  Relieving factors: rest  PRECAUTIONS: None  RED FLAGS: None   WEIGHT BEARING RESTRICTIONS: No  FALLS:  Has patient fallen in last 6 months? 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  PLOF: Independent  PATIENT GOALS: to be pain free  PERTINENT HISTORY:  Kidney transplant and several other surgeries - please see above  Sexual abuse: No  BOWEL MOVEMENT: Pain with bowel movement: No Type of bowel movement:Type (Bristol Stool Scale) 3 Fully empty rectum: No Leakage: No Pads: No Fiber supplement: No  URINATION: Pain with urination: No Fully empty bladder: Yes:   Stream: Strong Urgency: Yes:   Frequency: to be asked Leakage: Urge to void, Walking to the bathroom, Coughing, Sneezing, Laughing, and Exercise Pads: Yes:     INTERCOURSE: n/a  PREGNANCY: Vaginal deliveries 3 Tearing Yes:   C-section deliveries 0 Currently pregnant No  PROLAPSE: None   OBJECTIVE:  Note: Objective measures were completed at Evaluation unless otherwise noted.     COGNITION: Overall cognitive status: Within functional limits for tasks assessed     SENSATION: Light touch: Appears intact Proprioception: Appears intact  MUSCLE LENGTH: Hamstrings: Right 70 deg; Left 70 deg   LUMBAR SPECIAL TESTS:  Straight leg raise test: Positive for tightness   POSTURE: rounded shoulders, forward head, decreased lumbar lordosis, and flexed trunk   PELVIC ALIGNMENT: seems even  LUMBARAROM/PROM: tight and stiff throughout  LOWER EXTREMITY ROM: tight throughout bilateral hips  LOWER EXTREMITY MMT:4/5 grossly throughout  PALPATION:   General  right shoulder limited AROM, painful- abd to max 90 degress Tight and tender abdominal scars- post hysterectomy scar most irritable                External Perineal Exam dry tissues                             Internal Pelvic Floor dry tissues present  Patient confirms identification and approves PT to assess internal pelvic floor and treatment Yes  PELVIC MMT:   MMT eval  Vaginal 3/5  Internal Anal Sphincter   External Anal Sphincter   Puborectalis   Diastasis Recti no  (Blank rows = not tested)        TONE: low  PROLAPSE: Posterior vaginal wall laxity  TODAY'S TREATMENT:  DATE: 08/20/23     Manual- dry needling and passive ROM right shoulder There ex- shoulder PROM- flexion, IR in standing and flexion in sitting on pulleys Upper trap stretch Levator stretch HA with thera band ER with TB Bent over rows with #10 kb with transverse abdominis breath - demo only STS with #10 kb with transverse abdominis breath - demo only    PATIENT  EDUCATION:  Education details: relevant anatomy, vaginal estrogen, bladder irritants, scar massage Person educated: Patient Education method: Explanation, Demonstration, Tactile cues, Verbal cues, and Handouts Education comprehension: verbalized understanding and needs further education Trigger Point Dry Needling  Initial Treatment: Pt instructed on Dry Needling rational, procedures, and possible side effects. Pt instructed to expect mild to moderate muscle soreness later in the day and/or into the next day.  Pt instructed in methods to reduce muscle soreness. Pt instructed to continue prescribed HEP. Patient was educated on signs and symptoms of infection and other risk factors and advised to seek medical attention should they occur.  Patient verbalized understanding of these instructions and education.   Patient Verbal Consent Given: Yes Education Handout Provided: Yes Muscles Treated: right UT, right levator Electrical Stimulation Performed: No Treatment Response/Outcome: less pain   HOME EXERCISE PROGRAM: [id number] ASSESSMENT:  CLINICAL IMPRESSION: Pt with increased right shoulder pain, right UT TPs and fatigued today. Did well with trial of dry needling right UT. Pt dizzy post treatment. Able to incorporate some shoulder strengthening. Unable to focus on core exercises to address SUI d/t shoulder pain. Exercises demonstrated only- lifting and bending a KB to duplicate lifting her 1 yo grandson that she has to watch. Seems like urgency is better, pt more with SUI at this point. Encouraged pt to be consistent with her HEP  OBJECTIVE IMPAIRMENTS: decreased activity tolerance, decreased endurance, decreased mobility, difficulty walking, decreased ROM, decreased strength, increased fascial restrictions, increased muscle spasms, impaired flexibility, impaired tone, and pain.   ACTIVITY LIMITATIONS: continence, toileting, and reach over head  PARTICIPATION LIMITATIONS: community  activity  PERSONAL FACTORS: Time since onset of injury/illness/exacerbation are also affecting patient's functional outcome.   REHAB POTENTIAL: Good  CLINICAL DECISION MAKING: Stable/uncomplicated  EVALUATION COMPLEXITY: Moderate   GOALS: Goals reviewed with patient? Yes  SHORT TERM GOALS: Target date: 08/20/2023    Pt will be I with scar massage Baseline: Goal status: INITIAL  2.  Pt will dem initial HEP Baseline:  Goal status: progressing  3.  Pt will report reducing mixed incontinence by 50% Baseline:  Goal status: progressing  4.  Pt will be I with bladder retraining and healthy bladder habits Baseline:  Goal status: met   LONG TERM GOALS: Target date: 09/17/2023    Pt will report max 2/10 low back pain Baseline:  Goal status: INITIAL  2.  Pt will soak 0 pads/ day Baseline:  Goal status: INITIAL  3.  Pt will be I with her advanced HEP Baseline:  Goal status: INITIAL  4.  Pt will report 0 pain abdominal scars Baseline:  Goal status: INITIAL  5.  Pt will report complete rectum emptying Baseline:  Goal status: INITIAL   PLAN:  PT FREQUENCY: 1-2x/week  PT DURATION: 8 weeks  PLANNED INTERVENTIONS: 97110-Therapeutic exercises, 97530- Therapeutic activity, 97112- Neuromuscular re-education, 97535- Self Care, [id number]- Manual therapy, Taping, Dry Needling, Joint mobilization, Joint manipulation, Spinal manipulation, Spinal mobilization, and Scar mobilization  PLAN FOR NEXT SESSION: urge drill- review and make sure she understands exercises, stretches, constipation strategies   Fard Borunda, PT  08/20/23 11:56 AM

## 2023-08-26 ENCOUNTER — Encounter: Payer: Self-pay | Admitting: Physical Therapy

## 2023-08-26 ENCOUNTER — Ambulatory Visit: Payer: Medicare HMO | Attending: Urology | Admitting: Physical Therapy

## 2023-08-26 DIAGNOSIS — M5416 Radiculopathy, lumbar region: Secondary | ICD-10-CM | POA: Insufficient documentation

## 2023-08-26 DIAGNOSIS — M62830 Muscle spasm of back: Secondary | ICD-10-CM | POA: Diagnosis present

## 2023-08-26 DIAGNOSIS — R279 Unspecified lack of coordination: Secondary | ICD-10-CM | POA: Insufficient documentation

## 2023-08-26 DIAGNOSIS — M62838 Other muscle spasm: Secondary | ICD-10-CM | POA: Insufficient documentation

## 2023-08-26 DIAGNOSIS — R252 Cramp and spasm: Secondary | ICD-10-CM | POA: Diagnosis present

## 2023-08-26 DIAGNOSIS — M5459 Other low back pain: Secondary | ICD-10-CM | POA: Diagnosis present

## 2023-08-26 DIAGNOSIS — M6281 Muscle weakness (generalized): Secondary | ICD-10-CM | POA: Diagnosis present

## 2023-08-26 DIAGNOSIS — R2689 Other abnormalities of gait and mobility: Secondary | ICD-10-CM | POA: Diagnosis present

## 2023-08-26 DIAGNOSIS — R293 Abnormal posture: Secondary | ICD-10-CM | POA: Insufficient documentation

## 2023-08-26 NOTE — Therapy (Signed)
 OUTPATIENT PHYSICAL THERAPY FEMALE PELVIC EVALUATION   Patient Name: Cynthia [name] MRN: [id number] DOB:Feb 12, 1971, 53 y.o., female Today's Date: 08/26/2023  END OF SESSION:  PT End of Session - 08/26/23 1319     Visit Number 5    Authorization Type [employer] approved 10 visits 07/23/23-09/17/23 [id number]    PT Start Time 1249    PT Stop Time 1319    PT Time Calculation (min) 30 min    Activity Tolerance Patient tolerated treatment well    Behavior During Therapy [facility] for tasks assessed/performed                 Past Medical History:  Diagnosis Date   Allergy    pollen   Anemia    Anxiety    BRCA2 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 03/01/2021   Breast cancer [facility])    Cataract    surgical repair bilateral   Complication of anesthesia    not herself for 2 days after last surgery (brest lumpectomy)   Coronary artery disease    Depression    ESRD on hemodialysis (HCC)    Home HD 5x per week- not on dialysis now had tramsplant 4/17   Family history of ovarian cancer 02/22/2021   GERD (gastroesophageal reflux disease)    Hearing loss 2017   right ear   History of blood transfusion    transfusion reaction   Hyperlipidemia    Hypertension    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 per pt-dr levy also II; Per patient 08/18/21 she is Type 2   Kidney transplant recipient    Leukocytosis 06/13/2021   Neuromuscular disorder (HCC)    NEUROPATHY   Sleep apnea    Past Surgical History:  Procedure Laterality Date   ABDOMINAL HYSTERECTOMY     BREAST EXCISIONAL BIOPSY Right 08/2015   BREAST LUMPECTOMY WITH RADIOACTIVE SEED LOCALIZATION Right 09/14/2015   Procedure: RIGHT BREAST LUMPECTOMY WITH RADIOACTIVE SEED LOCALIZATION;  Surgeon: Manus Rudd, MD;  Location: [facility];  Service: General;  Laterality: Right;   BREAST LUMPECTOMY WITH RADIOACTIVE SEED LOCALIZATION Left 03/02/2021   Procedure: LEFT  BREAST SEED LUMPECTOMY LEFT SENTINEL LYMPH NODE MAPPING;  Surgeon: Harriette Bouillon, MD;  Location:  SURGERY CENTER;  Service: General;  Laterality: Left;  GEN AND PEC BLOCK   BREAST SURGERY Bilateral    biopsy bilateral   CARDIAC CATHETERIZATION     CATARACT EXTRACTION Bilateral    bilateral   CHOLECYSTECTOMY     CYST REMOVAL NECK     DIALYSIS FISTULA CREATION Left    EYE SURGERY Bilateral    lazer   kidney transplant     LEFT HEART CATH AND CORONARY ANGIOGRAPHY N/A 03/04/2019   Procedure: LEFT HEART CATH AND CORONARY ANGIOGRAPHY;  Surgeon: Swaziland, Peter M, MD;  Location: [facility] INVASIVE CV LAB;  Service: Cardiovascular;  Laterality: N/A;   LEFT HEART CATHETERIZATION WITH CORONARY ANGIOGRAM N/A 03/30/2014   Procedure: LEFT HEART CATHETERIZATION WITH CORONARY ANGIOGRAM;  Surgeon: Lesleigh Noe, MD;  Location: [facility] CATH LAB;  Service: Cardiovascular;  Laterality: N/A;   LIPOMA EXCISION N/A 02/06/2017   Procedure: EXCISION POSTERIOR NECK SEBACEOUS CYST;  Surgeon: Abigail Miyamoto, MD;  Location: [facility] OR;  Service: General;  Laterality: N/A;   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   Procedure: REPAIR OF ARTERIOVENOUS FISTULA ANEURYSM;  Surgeon: Nada Libman, MD;  Location: [facility] OR;  Service: Vascular;  Laterality: Left;  REVISON OF ARTERIOVENOUS FISTULA Left 09/28/2013   Procedure: EXCISE ESCHAR LEFT ARM  ARTERIOVENOUS FISTULA WITH PLICATION OF LEFT ARM ARTERIOVENOUS FISTULA;  Surgeon: Sherren Kerns, MD;  Location: [facility] OR;  Service: Vascular;  Laterality: Left;   REVISON OF ARTERIOVENOUS FISTULA Left 08/26/2015   Procedure: RESECTION ANEURYSM OF LEFT ARM ARTERIOVENOUS FISTULA  ;  Surgeon: Nada Libman, MD;  Location: [facility] OR;  Service: Vascular;  Laterality: Left;   ROBOTIC ASSISTED BILATERAL SALPINGO OOPHERECTOMY N/A 07/11/2022   Procedure: XI ROBOTIC ASSISTED BILATERAL SALPINGO OOPHORECTOMY;  Surgeon: Carver Fila, MD;  Location: [facility] ORS;  Service:  Gynecology;  Laterality: N/A;   SIMPLE MASTECTOMY WITH AXILLARY SENTINEL NODE BIOPSY Bilateral 08/22/2021   Procedure: BILATERAL SIMPLE MASTECTOMY;  Surgeon: Harriette Bouillon, MD;  Location: [facility] OR;  Service: General;  Laterality: Bilateral;   TUBAL LIGATION     Patient Active Problem List   Diagnosis Date Noted   Gastroesophageal reflux disease without esophagitis 07/28/2022   AKI (acute kidney injury/renal transplant on immunosuppresants  08/24/2021   BRCA2 gene mutation positive s/p bilateral masectomy  08/22/2021   PICC (peripherally inserted central catheter) in place 06/13/2021   Stage 3a chronic kidney disease (CKD) (HCC) 05/28/2021   Thrombocytopenia (HCC) 05/28/2021   Aortic atherosclerosis (HCC) 03/07/2021   Hypercholesterolemia 03/07/2021   Family history of ovarian cancer 02/22/2021   Malignant neoplasm of lower-inner quadrant of left breast in female, estrogen receptor positive (HCC) 02/15/2021   Foot pain, bilateral 07/28/2020   Insulin dependent type 2 diabetes mellitus (HCC) 06/06/2019   DKA (diabetic ketoacidosis) (HCC) 06/02/2019   HTN (hypertension) 03/04/2019   Abnormal nuclear stress test 03/04/2019   Diabetic nephropathy (HCC) 03/03/2019   History of renal transplant 11/18/2015   Diabetic retinopathy associated with type 2 diabetes mellitus (HCC) 04/13/2015   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   Chronic constipation 07/28/2013   Paresthesias 07/28/2013   Diabetes mellitus type I [facility])    Kidney transplant status 05/28/2013   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/11/2013   Precordial chest pain 02/10/2013   Awaiting organ transplant 09/19/2012    PCP: Loura Back, NP  REFERRING PROVIDER: Crist Fat, MD  REFERRING DIAG: N39.41 (ICD-10-CM) - Urge incontinence  THERAPY DIAG:  Unspecified lack of coordination  Other muscle spasm  Muscle weakness (generalized)  Abnormal posture  Rationale for  Evaluation and Treatment: Rehabilitation  ONSET DATE: 18 months ago  SUBJECTIVE:  SUBJECTIVE STATEMENT: Pt 20 mins late for her apt. Reports that she thought her appt was for her arm.  Interpreter claudia present Pain right shoulder 5/10 Leaking when she bends over and when she is carrying her grandson or something heavy Was able to do her exercises Last appt dry needling helped a lot Has been able to do her pelvic exercises some, leaking has been better Has had some pain medial elbow as well. Has been doing a lot of crafts   Fluid intake: Yes: coffee and water, 4 glasses of water 16 glasses, soda     PAIN:  Are you having pain? Yes NPRS scale: 5/10 Pain location:  lower abdomen where ovaries used to be  Pain type: aching Pain description: intermittent   Aggravating factors: sweeping and mopping aggravates her low back pain and  Relieving factors: rest  PRECAUTIONS: None  RED FLAGS: None   WEIGHT BEARING RESTRICTIONS: No  FALLS:  Has patient fallen in last 6 months? 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  PLOF: Independent  PATIENT GOALS: to be pain free  PERTINENT HISTORY:  Kidney transplant and several other surgeries - please see above  Sexual abuse: No  BOWEL MOVEMENT: Pain with bowel movement: No Type of bowel movement:Type (Bristol Stool Scale) 3 Fully empty rectum: No Leakage: No Pads: No Fiber supplement: No  URINATION: Pain with urination: No Fully empty bladder: Yes:   Stream: Strong Urgency: Yes:   Frequency: to be asked Leakage: Urge to void, Walking to the bathroom, Coughing, Sneezing, Laughing,  and Exercise Pads: Yes:    INTERCOURSE: n/a  PREGNANCY: Vaginal deliveries 3 Tearing Yes:   C-section deliveries 0 Currently pregnant No  PROLAPSE: None   OBJECTIVE:  Note: Objective measures were completed at Evaluation unless otherwise noted.     COGNITION: Overall cognitive status: Within functional limits for tasks assessed     SENSATION: Light touch: Appears intact Proprioception: Appears intact  MUSCLE LENGTH: Hamstrings: Right 70 deg; Left 70 deg   LUMBAR SPECIAL TESTS:  Straight leg raise test: Positive for tightness   POSTURE: rounded shoulders, forward head, decreased lumbar lordosis, and flexed trunk   PELVIC ALIGNMENT: seems even  LUMBARAROM/PROM: tight and stiff throughout  LOWER EXTREMITY ROM: tight throughout bilateral hips  LOWER EXTREMITY MMT:4/5 grossly throughout  PALPATION:   General  right shoulder limited AROM, painful- abd to max 90 degress Tight and tender abdominal scars- post hysterectomy scar most irritable                External Perineal Exam dry tissues                             Internal Pelvic Floor dry tissues present  Patient confirms identification and approves PT to assess internal pelvic floor and treatment Yes  PELVIC MMT:   MMT eval  Vaginal 3/5  Internal Anal Sphincter   External Anal Sphincter   Puborectalis   Diastasis Recti no  (Blank rows = not tested)        TONE: low  PROLAPSE: Posterior vaginal wall laxity  TODAY'S TREATMENT:  DATE: 08/26/23     Manual- dry needling and passive ROM right shoulder, STM right UT There ex- shoulder PROM- flexion, IR in standing and flexion in sitting on pulleys Upper trap stretch Levator stretch HA with red thera band pain free range ER with TB    PATIENT EDUCATION:  Education details: relevant anatomy, vaginal estrogen, bladder  irritants, scar massage Person educated: Patient Education method: Explanation, Demonstration, Tactile cues, Verbal cues, and Handouts Education comprehension: verbalized understanding and needs further education Trigger Point Dry Needling  Initial Treatment: Pt instructed on Dry Needling rational, procedures, and possible side effects. Pt instructed to expect mild to moderate muscle soreness later in the day and/or into the next day.  Pt instructed in methods to reduce muscle soreness. Pt instructed to continue prescribed HEP. Patient was educated on signs and symptoms of infection and other risk factors and advised to seek medical attention should they occur.  Patient verbalized understanding of these instructions and education.   Patient Verbal Consent Given: Yes Education Handout Provided: Yes Muscles Treated: right UT, right levator Electrical Stimulation Performed: No Treatment Response/Outcome: less pain   HOME EXERCISE PROGRAM: [id number] ASSESSMENT:  CLINICAL IMPRESSION: Pt with increased right shoulder pain, right UT TPs and fatigued today. Did well with manual and dry needling right UT. Able to incorporate some shoulder strengthening. Unable to focus on core exercises to address SUI d/t shoulder pain Seems like urgency is better, pt more with SUI at this point. Encouraged pt to be consistent with her HEP  OBJECTIVE IMPAIRMENTS: decreased activity tolerance, decreased endurance, decreased mobility, difficulty walking, decreased ROM, decreased strength, increased fascial restrictions, increased muscle spasms, impaired flexibility, impaired tone, and pain.   ACTIVITY LIMITATIONS: continence, toileting, and reach over head  PARTICIPATION LIMITATIONS: community activity  PERSONAL FACTORS: Time since onset of injury/illness/exacerbation are also affecting patient's functional outcome.   REHAB POTENTIAL: Good  CLINICAL DECISION MAKING:  Stable/uncomplicated  EVALUATION COMPLEXITY: Moderate   GOALS: Goals reviewed with patient? Yes  SHORT TERM GOALS: Target date: 08/20/2023    Pt will be I with scar massage Baseline: Goal status: INITIAL  2.  Pt will dem initial HEP Baseline:  Goal status: progressing  3.  Pt will report reducing mixed incontinence by 50% Baseline:  Goal status: progressing  4.  Pt will be I with bladder retraining and healthy bladder habits Baseline:  Goal status: met   LONG TERM GOALS: Target date: 09/17/2023    Pt will report max 2/10 low back pain Baseline:  Goal status: INITIAL  2.  Pt will soak 0 pads/ day Baseline:  Goal status: INITIAL  3.  Pt will be I with her advanced HEP Baseline:  Goal status: INITIAL  4.  Pt will report 0 pain abdominal scars Baseline:  Goal status: INITIAL  5.  Pt will report complete rectum emptying Baseline:  Goal status: INITIAL   PLAN:  PT FREQUENCY: 1-2x/week  PT DURATION: 8 weeks  PLANNED INTERVENTIONS: 97110-Therapeutic exercises, 97530- Therapeutic activity, 97112- Neuromuscular re-education, 97535- Self Care, [id number]- Manual therapy, Taping, Dry Needling, Joint mobilization, Joint manipulation, Spinal manipulation, Spinal mobilization, and Scar mobilization  PLAN FOR NEXT SESSION: urge drill- review and make sure she understands exercises, stretches, constipation strategies   Raseel Jans, PT 08/26/23 1:20 PM

## 2023-08-29 ENCOUNTER — Ambulatory Visit: Payer: Medicare HMO | Attending: Registered Nurse

## 2023-08-29 ENCOUNTER — Other Ambulatory Visit: Payer: Self-pay

## 2023-08-29 DIAGNOSIS — M25512 Pain in left shoulder: Secondary | ICD-10-CM | POA: Diagnosis present

## 2023-08-29 DIAGNOSIS — M5459 Other low back pain: Secondary | ICD-10-CM | POA: Diagnosis not present

## 2023-08-29 DIAGNOSIS — R279 Unspecified lack of coordination: Secondary | ICD-10-CM | POA: Insufficient documentation

## 2023-08-29 DIAGNOSIS — R252 Cramp and spasm: Secondary | ICD-10-CM

## 2023-08-29 DIAGNOSIS — M62838 Other muscle spasm: Secondary | ICD-10-CM | POA: Diagnosis not present

## 2023-08-29 DIAGNOSIS — M6281 Muscle weakness (generalized): Secondary | ICD-10-CM | POA: Insufficient documentation

## 2023-08-29 DIAGNOSIS — M47819 Spondylosis without myelopathy or radiculopathy, site unspecified: Secondary | ICD-10-CM | POA: Insufficient documentation

## 2023-08-29 DIAGNOSIS — R293 Abnormal posture: Secondary | ICD-10-CM | POA: Insufficient documentation

## 2023-08-29 DIAGNOSIS — M25511 Pain in right shoulder: Secondary | ICD-10-CM | POA: Diagnosis present

## 2023-08-29 DIAGNOSIS — M62830 Muscle spasm of back: Secondary | ICD-10-CM | POA: Insufficient documentation

## 2023-08-29 NOTE — Therapy (Signed)
OUTPATIENT PHYSICAL THERAPY FEMALE PELVIC EVALUATION 08/29/23 RIGHT SHOULDER EVALUATION   Patient Name: Quaniyah Bugh MRN: [id number] DOB:10-28-1970, 53 y.o., female Today's Date: 08/29/2023  END OF SESSION:  PT End of Session - 08/29/23 1019     Visit Number 6    Number of Visits 10    Date for PT Re-Evaluation 09/17/23    Authorization Type [employer] approved 10 visits 07/23/23-09/17/23 [id number]    Authorization - Visit Number 6    Authorization - Number of Visits 10    Progress Note Due on Visit 9    PT Start Time 1020    PT Stop Time 1106    PT Time Calculation (min) 46 min    Activity Tolerance Patient limited by pain    Behavior During Therapy [facility] for tasks assessed/performed                 Past Medical History:  Diagnosis Date   Allergy    pollen   Anemia    Anxiety    BRCA2 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 03/01/2021   Breast cancer [facility])    Cataract    surgical repair bilateral   Complication of anesthesia    not herself for 2 days after last surgery (brest lumpectomy)   Coronary artery disease    Depression    ESRD on hemodialysis (HCC)    Home HD 5x per week- not on dialysis now had tramsplant 4/17   Family history of ovarian cancer 02/22/2021   GERD (gastroesophageal reflux disease)    Hearing loss 2017   right ear   History of blood transfusion    transfusion reaction   Hyperlipidemia    Hypertension    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 per pt-dr levy also II; Per patient 08/18/21 she is Type 2   Kidney transplant recipient    Leukocytosis 06/13/2021   Neuromuscular disorder (HCC)    NEUROPATHY   Sleep apnea    Past Surgical History:  Procedure Laterality Date   ABDOMINAL HYSTERECTOMY     BREAST EXCISIONAL BIOPSY Right 08/2015   BREAST LUMPECTOMY WITH RADIOACTIVE SEED LOCALIZATION Right 09/14/2015   Procedure: RIGHT BREAST LUMPECTOMY WITH RADIOACTIVE SEED LOCALIZATION;   Surgeon: Manus Rudd, MD;  Location: [facility];  Service: General;  Laterality: Right;   BREAST LUMPECTOMY WITH RADIOACTIVE SEED LOCALIZATION Left 03/02/2021   Procedure: LEFT BREAST SEED LUMPECTOMY LEFT SENTINEL LYMPH NODE MAPPING;  Surgeon: Harriette Bouillon, MD;  Location: [facility];  Service: General;  Laterality: Left;  GEN AND PEC BLOCK   BREAST SURGERY Bilateral    biopsy bilateral   CARDIAC CATHETERIZATION     CATARACT EXTRACTION Bilateral    bilateral   CHOLECYSTECTOMY     CYST REMOVAL NECK     DIALYSIS FISTULA CREATION Left    EYE SURGERY Bilateral    lazer   kidney transplant     LEFT HEART CATH AND CORONARY ANGIOGRAPHY N/A 03/04/2019   Procedure: LEFT HEART CATH AND CORONARY ANGIOGRAPHY;  Surgeon: Swaziland, Peter M, MD;  Location: [facility] INVASIVE CV LAB;  Service: Cardiovascular;  Laterality: N/A;   LEFT HEART CATHETERIZATION WITH CORONARY ANGIOGRAM N/A 03/30/2014   Procedure: LEFT HEART CATHETERIZATION WITH CORONARY ANGIOGRAM;  Surgeon: Lesleigh Noe, MD;  Location: [facility] CATH LAB;  Service: Cardiovascular;  Laterality: N/A;   LIPOMA EXCISION N/A 02/06/2017   Procedure: EXCISION POSTERIOR NECK SEBACEOUS CYST;  Surgeon: Abigail Miyamoto, MD;  Location: [facility] OR;  Service: General;  Laterality: N/A;   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   Procedure: REPAIR OF ARTERIOVENOUS FISTULA ANEURYSM;  Surgeon: Nada Libman, MD;  Location: [facility] OR;  Service: Vascular;  Laterality: Left;   REVISON OF ARTERIOVENOUS FISTULA Left 09/28/2013   Procedure: EXCISE ESCHAR LEFT ARM  ARTERIOVENOUS FISTULA WITH PLICATION OF LEFT ARM ARTERIOVENOUS FISTULA;  Surgeon: Sherren Kerns, MD;  Location: [facility] OR;  Service: Vascular;  Laterality: Left;   REVISON OF ARTERIOVENOUS FISTULA Left 08/26/2015   Procedure: RESECTION ANEURYSM OF LEFT ARM ARTERIOVENOUS FISTULA  ;  Surgeon: Nada Libman, MD;  Location: [facility] OR;  Service: Vascular;  Laterality: Left;    ROBOTIC ASSISTED BILATERAL SALPINGO OOPHERECTOMY N/A 07/11/2022   Procedure: XI ROBOTIC ASSISTED BILATERAL SALPINGO OOPHORECTOMY;  Surgeon: Carver Fila, MD;  Location: [facility] ORS;  Service: Gynecology;  Laterality: N/A;   SIMPLE MASTECTOMY WITH AXILLARY SENTINEL NODE BIOPSY Bilateral 08/22/2021   Procedure: BILATERAL SIMPLE MASTECTOMY;  Surgeon: Harriette Bouillon, MD;  Location: [facility] OR;  Service: General;  Laterality: Bilateral;   TUBAL LIGATION     Patient Active Problem List   Diagnosis Date Noted   Gastroesophageal reflux disease without esophagitis 07/28/2022   AKI (acute kidney injury/renal transplant on immunosuppresants  08/24/2021   BRCA2 gene mutation positive s/p bilateral masectomy  08/22/2021   PICC (peripherally inserted central catheter) in place 06/13/2021   Stage 3a chronic kidney disease (CKD) (HCC) 05/28/2021   Thrombocytopenia (HCC) 05/28/2021   Aortic atherosclerosis (HCC) 03/07/2021   Hypercholesterolemia 03/07/2021   Family history of ovarian cancer 02/22/2021   Malignant neoplasm of lower-inner quadrant of left breast in female, estrogen receptor positive (HCC) 02/15/2021   Foot pain, bilateral 07/28/2020   Insulin dependent type 2 diabetes mellitus (HCC) 06/06/2019   DKA (diabetic ketoacidosis) (HCC) 06/02/2019   HTN (hypertension) 03/04/2019   Abnormal nuclear stress test 03/04/2019   Diabetic nephropathy (HCC) 03/03/2019   History of renal transplant 11/18/2015   Diabetic retinopathy associated with type 2 diabetes mellitus (HCC) 04/13/2015   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   Chronic constipation 07/28/2013   Paresthesias 07/28/2013   Diabetes mellitus type I (HCC)    Kidney transplant status 05/28/2013   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/11/2013   Precordial chest pain 02/10/2013   Awaiting organ transplant 09/19/2012    PCP: Loura Back, NP  REFERRING PROVIDER: Crist Fat, MD  REFERRING  DIAG: N39.41 (ICD-10-CM) - Urge incontinence  THERAPY DIAG:  Unspecified lack of coordination - Plan: PT plan of care cert/re-cert  Other muscle spasm - Plan: PT plan of care cert/re-cert  Muscle weakness (generalized) - Plan: PT plan of care cert/re-cert  Abnormal posture - Plan: PT plan of care cert/re-cert  Muscle spasm of back - Plan: PT plan of care cert/re-cert  Other low back pain - Plan: PT plan of care cert/re-cert  Cramp and spasm - Plan: PT plan of care cert/re-cert  Rationale for Evaluation and Treatment: Rehabilitation  ONSET DATE: 18 months ago  SUBJECTIVE:  SUBJECTIVE STATEMENT: Patient reports back pain for approx 3 years.  She has difficulty with IADL's and any bending stooping and squatting. She does not exercise on a regular basis. She is being seen by pelvic floor as well for bladder issues.  She is being evaluated today for her back pain and for a new referral for right shoulder pain.  She reports difficulty with ADL's and IADL's with regard to the shoulder.  She does not work.    Fluid intake: Yes: coffee and water, 4 glasses of water 16 glasses, soda     PAIN:  08/29/23 Patient had difficulty rating her pain.  She is having pelvic issues, low back issues and right shoulder issues.  She reports generally 5-7/10  Are you having pain? Yes NPRS scale: 5/10 Pain location:  lower abdomen where ovaries used to be  Pain type: aching Pain description: intermittent   Aggravating factors: sweeping and mopping aggravates her low back pain and  Relieving factors: rest  PRECAUTIONS: None  RED FLAGS: None   WEIGHT BEARING RESTRICTIONS: No  FALLS:  Has patient fallen in last 6 months? 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  PLOF: Independent  PATIENT GOALS: to be pain free  PERTINENT HISTORY:  Kidney transplant and several other surgeries - please see above  Sexual abuse: No  BOWEL MOVEMENT: Pain with bowel movement: No Type of bowel movement:Type (Bristol Stool Scale) 3 Fully empty rectum: No Leakage: No Pads: No Fiber supplement: No  URINATION: Pain with urination: No Fully empty bladder: Yes:   Stream: Strong Urgency: Yes:   Frequency: to be asked Leakage: Urge to void, Walking to the bathroom, Coughing, Sneezing, Laughing, and Exercise Pads: Yes:    INTERCOURSE: n/a  PREGNANCY: Vaginal deliveries 3 Tearing Yes:   C-section deliveries 0 Currently pregnant No  PROLAPSE: None   OBJECTIVE:  Note: Objective measures were completed at Evaluation unless otherwise noted.  Quick Dash  08/29/23 61.4/100= 61.4%   COGNITION: Overall cognitive status: Within functional limits for tasks assessed     SENSATION: Light touch: Appears intact Proprioception: Appears intact  MUSCLE LENGTH: Initial eval: Hamstrings: Right 70 deg; Left 70 deg  08/29/23: no change   LUMBAR SPECIAL TESTS:  Straight leg raise test: Positive for tightness  SHOULDER SPECIAL TESTS:  Hawkins/Kennedy: positive  Apprehension: negative  Drop Arm: negative  Empty Can: positive  POSTURE: rounded shoulders, forward head, decreased lumbar lordosis, and flexed trunk   PELVIC ALIGNMENT: seems even  LUMBARAROM/PROM: tight and stiff throughout  SHOULDER AROM: Flexion: 150 Abduction: 145 ER: to sub occipital area IR: to T12  LOWER EXTREMITY ROM: tight throughout bilateral hips  LOWER EXTREMITY MMT:4/5 grossly throughout  PALPATION:   General  right shoulder limited AROM, painful- abd to max 90 degress Tight and tender abdominal scars- post hysterectomy scar most irritable                 External Perineal Exam dry tissues                             Internal Pelvic Floor dry tissues present  Patient confirms identification and approves PT to assess internal pelvic floor and treatment Yes  PELVIC MMT:   MMT eval  Vaginal 3/5  Internal Anal Sphincter   External Anal Sphincter  Puborectalis   Diastasis Recti no  (Blank rows = not tested)   SHOULDER MMT: Inconclusive due to pain level.  Generally 3/5 throughout right shoulder.       TONE: low  PROLAPSE: Posterior vaginal wall laxity  TODAY'S TREATMENT:                                                                                                                              DATE: 08/29/23 Re-assessment of back pain by ortho PT Assessment of right shoulder by ortho PT   Educated patient on the mechanics of the shoulder and possible rotator cuff deficit.  Suggested avoiding excessive overhead activity, avoid weighted shoulder elevation with arm abducted such as reaching behind the seat of a car to access her purse.  Suggested ice for pain relief.   Added supine hamstring stretch, seated piriformis stretch and standing quad stretch for HEP.  Each stretch 3 times each LE holding 20-30 sec each.     Manual- dry needling and passive ROM right shoulder, STM right UT There ex- shoulder PROM- flexion, IR in standing and flexion in sitting on pulleys Upper trap stretch Levator stretch HA with red thera band pain free range ER with TB    PATIENT EDUCATION:  Education details: relevant anatomy, vaginal estrogen, bladder irritants, scar massage Person educated: Patient Education method: Explanation, Demonstration, Tactile cues, Verbal cues, and Handouts Education comprehension: verbalized understanding and needs further education Trigger Point Dry Needling  Initial Treatment: Pt instructed on Dry Needling rational, procedures, and possible side effects. Pt instructed to expect mild to moderate muscle  soreness later in the day and/or into the next day.  Pt instructed in methods to reduce muscle soreness. Pt instructed to continue prescribed HEP. Patient was educated on signs and symptoms of infection and other risk factors and advised to seek medical attention should they occur.  Patient verbalized understanding of these instructions and education.   Patient Verbal Consent Given: Yes Education Handout Provided: Yes Muscles Treated: right UT, right levator Electrical Stimulation Performed: No Treatment Response/Outcome: less pain   HOME EXERCISE PROGRAM: Access Code: [email or web address] URL: [email or web address] Date: 08/29/2023 Prepared by: Mikey Kirschner  Exercises - Cat Cow  - 1 x daily - 7 x weekly - 3 sets - 10 reps - Child's Pose Stretch  - 1 x daily - 7 x weekly - 3 sets - 10 reps - Quick Flick Pelvic Floor Contractions in [place]  - 1 x daily - 7 x weekly - 3 sets - 10 reps - Supine Lower Trunk Rotation  - 1 x daily - 7 x weekly - 3 sets - 10 reps - Supine Single Knee to Chest Stretch  - 1 x daily - 7 x weekly - 3 sets - 10 reps - Abdominal Press into [place]  - 1 x daily - 7 x weekly - 3 sets - 10 reps - Supine Hamstring Stretch with Strap  - 1 x daily -  7 x weekly - 1 sets - 3 reps - 30 sec hold - Seated Piriformis Stretch with Trunk Bend  - 1 x daily - 7 x weekly - 1 sets - 3 reps - 30 sec hold - Quadricep Stretch with Chair and Counter Support  - 1 x daily - 7 x weekly - 1 sets - 3 reps - 30 sec hold  Patient Education - Urinary Urge Control Techniques - Lifestyle Changes to Support Urinary and Bladder Health - Urinary Urge Control Techniques [id number] ASSESSMENT:  CLINICAL IMPRESSION: Marlis was evaluated today for right shoulder pain.  She presents with decreased ROM, strength and function along with elevated pain.  She has positive impingement and empty can tests.  She does not work but has difficulty with doing any of her routine ADL's and IADL's.   She  would benefit from skilled PT for RC and scapular strengthening, ROM exercises, postural exercises and modalities for pain relief.  We will also incorporate dry needling to the shoulder and neck areas as well as the parascapular areas.  Our goal will be to restore proper arthrokinematics to the right GH joint to allow full painfree ROM and function of the right shoulder.    OBJECTIVE IMPAIRMENTS: decreased activity tolerance, decreased endurance, decreased mobility, difficulty walking, decreased ROM, decreased strength, increased fascial restrictions, increased muscle spasms, impaired flexibility, impaired tone, and pain.   ACTIVITY LIMITATIONS: continence, toileting, and reach over head  PARTICIPATION LIMITATIONS: community activity  PERSONAL FACTORS: Time since onset of injury/illness/exacerbation are also affecting patient's functional outcome.   REHAB POTENTIAL: Good  CLINICAL DECISION MAKING: Stable/uncomplicated  EVALUATION COMPLEXITY: Moderate   GOALS: Goals reviewed with patient? Yes  SHORT TERM GOALS: Target date: 08/20/2023    Pt will be I with scar massage Baseline: Goal status: INITIAL  2.  Pt will dem initial HEP Baseline:  Goal status: progressing  3.  Pt will report reducing mixed incontinence by 50% Baseline:  Goal status: progressing  4.  Pt will be I with bladder retraining and healthy bladder habits Baseline:  Goal status: met  5. Patient will be able to reach overhead into cabinets and on top of shelves without pain   Baseline:   Goal status: INITIAL   LONG TERM GOALS: Target date: 09/17/2023    Pt will report max 2/10 low back pain Baseline:  Goal status: INITIAL  2.  Pt will soak 0 pads/ day Baseline:  Goal status: INITIAL  3.  Pt will be I with her advanced HEP Baseline:  Goal status: INITIAL  4.  Pt will report 0 pain abdominal scars Baseline:  Goal status: INITIAL  5.  Pt will report complete rectum emptying Baseline:  Goal  status: INITIAL  6. Patient will have full right shoulder ROM with no pain  Baseline:  Goal status: INITIAL  7. Patient to be able to do all ADL's and IADL's without shoulder pain.   Baseline:  Goal status: INITIAL   PLAN:  PT FREQUENCY: 1-2x/week  PT DURATION: 8 weeks  PLANNED INTERVENTIONS: 97110-Therapeutic exercises, 97530- Therapeutic activity, 97112- Neuromuscular re-education, 97535- Self Care, 65784- Manual therapy, Taping, Dry Needling, Joint mobilization, Joint manipulation, Spinal manipulation, Spinal mobilization, and Scar mobilization  PLAN FOR NEXT SESSION: UBE, begin right shoulder strengthening and scapular stabilization, urge drill- review and make sure she understands exercises, stretches, constipation strategies   Tyia Binford B. Harshil Cavallaro, PT 08/29/23 2:38 PM [facility] [address] [place], [place] [address] Phone # [phone] Fax  [phone]  

## 2023-09-05 ENCOUNTER — Encounter: Payer: Self-pay | Admitting: Physical Therapy

## 2023-09-05 ENCOUNTER — Ambulatory Visit: Admitting: Physical Therapy

## 2023-09-05 DIAGNOSIS — R252 Cramp and spasm: Secondary | ICD-10-CM

## 2023-09-05 DIAGNOSIS — R279 Unspecified lack of coordination: Secondary | ICD-10-CM | POA: Diagnosis not present

## 2023-09-05 DIAGNOSIS — M6281 Muscle weakness (generalized): Secondary | ICD-10-CM

## 2023-09-05 DIAGNOSIS — M5459 Other low back pain: Secondary | ICD-10-CM

## 2023-09-05 DIAGNOSIS — R2689 Other abnormalities of gait and mobility: Secondary | ICD-10-CM

## 2023-09-05 DIAGNOSIS — M5416 Radiculopathy, lumbar region: Secondary | ICD-10-CM

## 2023-09-05 DIAGNOSIS — R293 Abnormal posture: Secondary | ICD-10-CM

## 2023-09-05 DIAGNOSIS — M62838 Other muscle spasm: Secondary | ICD-10-CM

## 2023-09-05 DIAGNOSIS — M62830 Muscle spasm of back: Secondary | ICD-10-CM

## 2023-09-05 NOTE — Therapy (Signed)
 OUTPATIENT PHYSICAL THERAPY FEMALE PELVIC EVALUATION   Patient Name: Cynthia [name] MRN: [id number] DOB:06-May-1971, 53 y.o., female Today's Date: 09/05/2023  END OF SESSION:  PT End of Session - 09/05/23 1037     Visit Number 7    Number of Visits 10    Date for PT Re-Evaluation 09/17/23    Authorization Type [employer] approved 10 visits 07/23/23-09/17/23 [id number]    Authorization - Number of Visits 10    Progress Note Due on Visit 9    PT Start Time 1035    PT Stop Time 1100    PT Time Calculation (min) 25 min    Activity Tolerance Patient limited by pain;Patient tolerated treatment well    Behavior During Therapy [facility] for tasks assessed/performed                  Past Medical History:  Diagnosis Date   Allergy    pollen   Anemia    Anxiety    BRCA2 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 03/01/2021   Breast cancer [facility])    Cataract    surgical repair bilateral   Complication of anesthesia    not herself for 2 days after last surgery (brest lumpectomy)   Coronary artery disease    Depression    ESRD on hemodialysis (HCC)    Home HD 5x per week- not on dialysis now had tramsplant 4/17   Family history of ovarian cancer 02/22/2021   GERD (gastroesophageal reflux disease)    Hearing loss 2017   right ear   History of blood transfusion    transfusion reaction   Hyperlipidemia    Hypertension    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 per pt-dr levy also II; Per patient 08/18/21 she is Type 2   Kidney transplant recipient    Leukocytosis 06/13/2021   Neuromuscular disorder (HCC)    NEUROPATHY   Sleep apnea    Past Surgical History:  Procedure Laterality Date   ABDOMINAL HYSTERECTOMY     BREAST EXCISIONAL BIOPSY Right 08/2015   BREAST LUMPECTOMY WITH RADIOACTIVE SEED LOCALIZATION Right 09/14/2015   Procedure: RIGHT BREAST LUMPECTOMY WITH RADIOACTIVE SEED LOCALIZATION;  Surgeon: Manus Rudd, MD;  Location:  [facility];  Service: General;  Laterality: Right;   BREAST LUMPECTOMY WITH RADIOACTIVE SEED LOCALIZATION Left 03/02/2021   Procedure: LEFT BREAST SEED LUMPECTOMY LEFT SENTINEL LYMPH NODE MAPPING;  Surgeon: Harriette Bouillon, MD;  Location:  SURGERY CENTER;  Service: General;  Laterality: Left;  GEN AND PEC BLOCK   BREAST SURGERY Bilateral    biopsy bilateral   CARDIAC CATHETERIZATION     CATARACT EXTRACTION Bilateral    bilateral   CHOLECYSTECTOMY     CYST REMOVAL NECK     DIALYSIS FISTULA CREATION Left    EYE SURGERY Bilateral    lazer   kidney transplant     LEFT HEART CATH AND CORONARY ANGIOGRAPHY N/A 03/04/2019   Procedure: LEFT HEART CATH AND CORONARY ANGIOGRAPHY;  Surgeon: Swaziland, Peter M, MD;  Location: [facility] INVASIVE CV LAB;  Service: Cardiovascular;  Laterality: N/A;   LEFT HEART CATHETERIZATION WITH CORONARY ANGIOGRAM N/A 03/30/2014   Procedure: LEFT HEART CATHETERIZATION WITH CORONARY ANGIOGRAM;  Surgeon: Lesleigh Noe, MD;  Location: [facility] CATH LAB;  Service: Cardiovascular;  Laterality: N/A;   LIPOMA EXCISION N/A 02/06/2017   Procedure: EXCISION POSTERIOR NECK SEBACEOUS CYST;  Surgeon: Abigail Miyamoto, MD;  Location: [facility] OR;  Service: General;  Laterality: N/A;   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   Procedure: REPAIR OF ARTERIOVENOUS FISTULA ANEURYSM;  Surgeon: Nada Libman, MD;  Location: [facility] OR;  Service: Vascular;  Laterality: Left;   REVISON OF ARTERIOVENOUS FISTULA Left 09/28/2013   Procedure: EXCISE ESCHAR LEFT ARM  ARTERIOVENOUS FISTULA WITH PLICATION OF LEFT ARM ARTERIOVENOUS FISTULA;  Surgeon: Sherren Kerns, MD;  Location: [facility] OR;  Service: Vascular;  Laterality: Left;   REVISON OF ARTERIOVENOUS FISTULA Left 08/26/2015   Procedure: RESECTION ANEURYSM OF LEFT ARM ARTERIOVENOUS FISTULA  ;  Surgeon: Nada Libman, MD;  Location: [facility] OR;  Service: Vascular;  Laterality: Left;   ROBOTIC ASSISTED BILATERAL SALPINGO  OOPHERECTOMY N/A 07/11/2022   Procedure: XI ROBOTIC ASSISTED BILATERAL SALPINGO OOPHORECTOMY;  Surgeon: Carver Fila, MD;  Location: [facility] ORS;  Service: Gynecology;  Laterality: N/A;   SIMPLE MASTECTOMY WITH AXILLARY SENTINEL NODE BIOPSY Bilateral 08/22/2021   Procedure: BILATERAL SIMPLE MASTECTOMY;  Surgeon: Harriette Bouillon, MD;  Location: [facility] OR;  Service: General;  Laterality: Bilateral;   TUBAL LIGATION     Patient Active Problem List   Diagnosis Date Noted   Gastroesophageal reflux disease without esophagitis 07/28/2022   AKI (acute kidney injury/renal transplant on immunosuppresants  08/24/2021   BRCA2 gene mutation positive s/p bilateral masectomy  08/22/2021   PICC (peripherally inserted central catheter) in place 06/13/2021   Stage 3a chronic kidney disease (CKD) (HCC) 05/28/2021   Thrombocytopenia (HCC) 05/28/2021   Aortic atherosclerosis (HCC) 03/07/2021   Hypercholesterolemia 03/07/2021   Family history of ovarian cancer 02/22/2021   Malignant neoplasm of lower-inner quadrant of left breast in female, estrogen receptor positive (HCC) 02/15/2021   Foot pain, bilateral 07/28/2020   Insulin dependent type 2 diabetes mellitus (HCC) 06/06/2019   DKA (diabetic ketoacidosis) (HCC) 06/02/2019   HTN (hypertension) 03/04/2019   Abnormal nuclear stress test 03/04/2019   Diabetic nephropathy (HCC) 03/03/2019   History of renal transplant 11/18/2015   Diabetic retinopathy associated with type 2 diabetes mellitus (HCC) 04/13/2015   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   Chronic constipation 07/28/2013   Paresthesias 07/28/2013   Diabetes mellitus type I (HCC)    Kidney transplant status 05/28/2013   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/11/2013   Precordial chest pain 02/10/2013   Awaiting organ transplant 09/19/2012    PCP: Loura Back, NP  REFERRING PROVIDER: Crist Fat, MD  REFERRING DIAG: N39.41 (ICD-10-CM) - Urge  incontinence  THERAPY DIAG:  Other muscle spasm  Muscle weakness (generalized)  Abnormal posture  Muscle spasm of back  Other low back pain  Cramp and spasm  Radiculopathy, lumbar region  Other abnormalities of gait and mobility  Rationale for Evaluation and Treatment: Rehabilitation  ONSET DATE: 18 months ago  SUBJECTIVE:  SUBJECTIVE STATEMENT: Pt 18 mins late for her appt. Pt reports that she has a lot of pain Sore form yardwork Shoulder is better, she is doing her exercises  Interpreter Janelly present Leaking when she bends over and when she is carrying her grandson or something heavy    Fluid intake: Yes: coffee and water, 4 glasses of water 16 glasses, soda     PAIN:  Are you having pain? Yes NPRS scale: 5/10 Pain location:  lower abdomen where ovaries used to be  Pain type: aching Pain description: intermittent   Aggravating factors: sweeping and mopping aggravates her low back pain and  Relieving factors: rest  PRECAUTIONS: None  RED FLAGS: None   WEIGHT BEARING RESTRICTIONS: No  FALLS:  Has patient fallen in last 6 months? 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  PLOF: Independent  PATIENT GOALS: to be pain free  PERTINENT HISTORY:  Kidney transplant and several other surgeries - please see above  Sexual abuse: No  BOWEL MOVEMENT: Pain with bowel movement: No Type of bowel movement:Type (Bristol Stool Scale) 3 Fully empty rectum: No Leakage: No Pads: No Fiber supplement: No  URINATION: Pain with urination: No Fully empty bladder: Yes:   Stream: Strong Urgency: Yes:   Frequency: to be  asked Leakage: Urge to void, Walking to the bathroom, Coughing, Sneezing, Laughing, and Exercise Pads: Yes:    INTERCOURSE: n/a  PREGNANCY: Vaginal deliveries 3 Tearing Yes:   C-section deliveries 0 Currently pregnant No  PROLAPSE: None   OBJECTIVE:  Note: Objective measures were completed at Evaluation unless otherwise noted.     COGNITION: Overall cognitive status: Within functional limits for tasks assessed     SENSATION: Light touch: Appears intact Proprioception: Appears intact  MUSCLE LENGTH: Hamstrings: Right 70 deg; Left 70 deg   LUMBAR SPECIAL TESTS:  Straight leg raise test: Positive for tightness   POSTURE: rounded shoulders, forward head, decreased lumbar lordosis, and flexed trunk   PELVIC ALIGNMENT: seems even  LUMBARAROM/PROM: tight and stiff throughout  LOWER EXTREMITY ROM: tight throughout bilateral hips  LOWER EXTREMITY MMT:4/5 grossly throughout  PALPATION:   General  right shoulder limited AROM, painful- abd to max 90 degress Tight and tender abdominal scars- post hysterectomy scar most irritable                External Perineal Exam dry tissues                             Internal Pelvic Floor dry tissues present  Patient confirms identification and approves PT to assess internal pelvic floor and treatment Yes  PELVIC MMT:   MMT eval  Vaginal 3/5  Internal Anal Sphincter   External Anal Sphincter   Puborectalis   Diastasis Recti no  (Blank rows = not tested)        TONE: low  PROLAPSE: Posterior vaginal wall laxity  TODAY'S TREATMENT:  DATE: 09/05/23     Neuro reed- hip adduction with ball with transverse abdominis breath 20 reps Ball press with transverse abdominis breath  STS with #5 with transverse abdominis breath   There ex- QL stretch    PATIENT EDUCATION:  Education details:  relevant anatomy, vaginal estrogen, bladder irritants, scar massage Person educated: Patient Education method: Explanation, Demonstration, Tactile cues, Verbal cues, and Handouts Education comprehension: verbalized understanding and needs further education Trigger Point Dry Needling  Initial Treatment: Pt instructed on Dry Needling rational, procedures, and possible side effects. Pt instructed to expect mild to moderate muscle soreness later in the day and/or into the next day.  Pt instructed in methods to reduce muscle soreness. Pt instructed to continue prescribed HEP. Patient was educated on signs and symptoms of infection and other risk factors and advised to seek medical attention should they occur.  Patient verbalized understanding of these instructions and education.   Patient Verbal Consent Given: Yes Education Handout Provided: Yes Muscles Treated: right UT, right levator Electrical Stimulation Performed: No Treatment Response/Outcome: less pain   HOME EXERCISE PROGRAM: [id number] ASSESSMENT:  CLINICAL IMPRESSION: Pt did well with her exercises, req VC's and TC's for optimal core coordination, tends to grip abdomen, form improved towards the end of tx session.  OBJECTIVE IMPAIRMENTS: decreased activity tolerance, decreased endurance, decreased mobility, difficulty walking, decreased ROM, decreased strength, increased fascial restrictions, increased muscle spasms, impaired flexibility, impaired tone, and pain.   ACTIVITY LIMITATIONS: continence, toileting, and reach over head  PARTICIPATION LIMITATIONS: community activity  PERSONAL FACTORS: Time since onset of injury/illness/exacerbation are also affecting patient's functional outcome.   REHAB POTENTIAL: Good  CLINICAL DECISION MAKING: Stable/uncomplicated  EVALUATION COMPLEXITY: Moderate   GOALS: Goals reviewed with patient? Yes  SHORT TERM GOALS: Target date: 08/20/2023    Pt will be I with scar  massage Baseline: Goal status: INITIAL  2.  Pt will dem initial HEP Baseline:  Goal status: progressing  3.  Pt will report reducing mixed incontinence by 50% Baseline:  Goal status: progressing  4.  Pt will be I with bladder retraining and healthy bladder habits Baseline:  Goal status: met   LONG TERM GOALS: Target date: 09/17/2023    Pt will report max 2/10 low back pain Baseline:  Goal status: INITIAL  2.  Pt will soak 0 pads/ day Baseline:  Goal status: INITIAL  3.  Pt will be I with her advanced HEP Baseline:  Goal status: INITIAL  4.  Pt will report 0 pain abdominal scars Baseline:  Goal status: INITIAL  5.  Pt will report complete rectum emptying Baseline:  Goal status: INITIAL   PLAN:  PT FREQUENCY: 1-2x/week  PT DURATION: 8 weeks  PLANNED INTERVENTIONS: 97110-Therapeutic exercises, 97530- Therapeutic activity, 97112- Neuromuscular re-education, 97535- Self Care, 98119- Manual therapy, Taping, Dry Needling, Joint mobilization, Joint manipulation, Spinal manipulation, Spinal mobilization, and Scar mobilization  PLAN FOR NEXT SESSION: urge drill- review and make sure she understands exercises, stretches, constipation strategies   Cynthia [name], PT 09/05/23 10:59 AM

## 2023-09-10 ENCOUNTER — Ambulatory Visit: Payer: Self-pay | Admitting: Physical Therapy

## 2023-09-10 ENCOUNTER — Encounter: Payer: Self-pay | Admitting: Physical Therapy

## 2023-09-10 DIAGNOSIS — M6281 Muscle weakness (generalized): Secondary | ICD-10-CM

## 2023-09-10 DIAGNOSIS — M62838 Other muscle spasm: Secondary | ICD-10-CM

## 2023-09-10 DIAGNOSIS — R279 Unspecified lack of coordination: Secondary | ICD-10-CM | POA: Diagnosis not present

## 2023-09-10 NOTE — Therapy (Signed)
 OUTPATIENT PHYSICAL THERAPY FEMALE PELVIC EVALUATION   Patient Name: Cynthia [name] MRN: [id number] DOB:1971-01-15, 53 y.o., female Today's Date: 09/10/2023  END OF SESSION:  PT End of Session - 09/10/23 1258     Visit Number 8    Date for PT Re-Evaluation 09/17/23    Authorization Type [employer] approved 10 visits 07/23/23-09/17/23 [id number]    PT Start Time 1245    PT Stop Time 1320    PT Time Calculation (min) 35 min    Activity Tolerance Patient limited by fatigue    Behavior During Therapy [facility] for tasks assessed/performed                   Past Medical History:  Diagnosis Date   Allergy    pollen   Anemia    Anxiety    BRCA2 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 03/01/2021   Breast cancer [facility])    Cataract    surgical repair bilateral   Complication of anesthesia    not herself for 2 days after last surgery (brest lumpectomy)   Coronary artery disease    Depression    ESRD on hemodialysis (HCC)    Home HD 5x per week- not on dialysis now had tramsplant 4/17   Family history of ovarian cancer 02/22/2021   GERD (gastroesophageal reflux disease)    Hearing loss 2017   right ear   History of blood transfusion    transfusion reaction   Hyperlipidemia    Hypertension    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 per pt-dr levy also II; Per patient 08/18/21 she is Type 2   Kidney transplant recipient    Leukocytosis 06/13/2021   Neuromuscular disorder (HCC)    NEUROPATHY   Sleep apnea    Past Surgical History:  Procedure Laterality Date   ABDOMINAL HYSTERECTOMY     BREAST EXCISIONAL BIOPSY Right 08/2015   BREAST LUMPECTOMY WITH RADIOACTIVE SEED LOCALIZATION Right 09/14/2015   Procedure: RIGHT BREAST LUMPECTOMY WITH RADIOACTIVE SEED LOCALIZATION;  Surgeon: Manus Rudd, MD;  Location: [facility];  Service: General;  Laterality: Right;   BREAST LUMPECTOMY WITH RADIOACTIVE SEED LOCALIZATION  Left 03/02/2021   Procedure: LEFT BREAST SEED LUMPECTOMY LEFT SENTINEL LYMPH NODE MAPPING;  Surgeon: Harriette Bouillon, MD;  Location: [facility];  Service: General;  Laterality: Left;  GEN AND PEC BLOCK   BREAST SURGERY Bilateral    biopsy bilateral   CARDIAC CATHETERIZATION     CATARACT EXTRACTION Bilateral    bilateral   CHOLECYSTECTOMY     CYST REMOVAL NECK     DIALYSIS FISTULA CREATION Left    EYE SURGERY Bilateral    lazer   kidney transplant     LEFT HEART CATH AND CORONARY ANGIOGRAPHY N/A 03/04/2019   Procedure: LEFT HEART CATH AND CORONARY ANGIOGRAPHY;  Surgeon: Swaziland, Peter M, MD;  Location: [facility]AS[facility];  Service: Cardiovascular;  Laterality: N/A;   LEFT HEART CATHETERIZATION WITH CORONARY ANGIOGRAM N/A 03/30/2014   Procedure: LEFT HEART CATHETERIZATION WITH CORONARY ANGIOGRAM;  Surgeon: Lesleigh Noe, MD;  Location: [facility] CATH LAB;  Service: Cardiovascular;  Laterality: N/A;   LIPOMA EXCISION N/A 02/06/2017   Procedure: EXCISION POSTERIOR NECK SEBACEOUS CYST;  Surgeon: Abigail Miyamoto, MD;  Location: [facility] OR;  Service: General;  Laterality: N/A;   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   Procedure: REPAIR OF ARTERIOVENOUS FISTULA ANEURYSM;  Surgeon: Nada Libman, MD;  Location:  [facility] OR;  Service: Vascular;  Laterality: Left;   REVISON OF ARTERIOVENOUS FISTULA Left 09/28/2013   Procedure: EXCISE ESCHAR LEFT ARM  ARTERIOVENOUS FISTULA WITH PLICATION OF LEFT ARM ARTERIOVENOUS FISTULA;  Surgeon: Sherren Kerns, MD;  Location: [facility] OR;  Service: Vascular;  Laterality: Left;   REVISON OF ARTERIOVENOUS FISTULA Left 08/26/2015   Procedure: RESECTION ANEURYSM OF LEFT ARM ARTERIOVENOUS FISTULA  ;  Surgeon: Nada Libman, MD;  Location: [facility] OR;  Service: Vascular;  Laterality: Left;   ROBOTIC ASSISTED BILATERAL SALPINGO OOPHERECTOMY N/A 07/11/2022   Procedure: XI ROBOTIC ASSISTED BILATERAL SALPINGO OOPHORECTOMY;  Surgeon: Carver Fila, MD;   Location: [facility] ORS;  Service: Gynecology;  Laterality: N/A;   SIMPLE MASTECTOMY WITH AXILLARY SENTINEL NODE BIOPSY Bilateral 08/22/2021   Procedure: BILATERAL SIMPLE MASTECTOMY;  Surgeon: Harriette Bouillon, MD;  Location: [facility] OR;  Service: General;  Laterality: Bilateral;   TUBAL LIGATION     Patient Active Problem List   Diagnosis Date Noted   Gastroesophageal reflux disease without esophagitis 07/28/2022   AKI (acute kidney injury/renal transplant on immunosuppresants  08/24/2021   BRCA2 gene mutation positive s/p bilateral masectomy  08/22/2021   PICC (peripherally inserted central catheter) in place 06/13/2021   Stage 3a chronic kidney disease (CKD) (HCC) 05/28/2021   Thrombocytopenia (HCC) 05/28/2021   Aortic atherosclerosis (HCC) 03/07/2021   Hypercholesterolemia 03/07/2021   Family history of ovarian cancer 02/22/2021   Malignant neoplasm of lower-inner quadrant of left breast in female, estrogen receptor positive (HCC) 02/15/2021   Foot pain, bilateral 07/28/2020   Insulin dependent type 2 diabetes mellitus (HCC) 06/06/2019   DKA (diabetic ketoacidosis) (HCC) 06/02/2019   HTN (hypertension) 03/04/2019   Abnormal nuclear stress test 03/04/2019   Diabetic nephropathy (HCC) 03/03/2019   History of renal transplant 11/18/2015   Diabetic retinopathy associated with type 2 diabetes mellitus (HCC) 04/13/2015   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   Chronic constipation 07/28/2013   Paresthesias 07/28/2013   Diabetes mellitus type I [facility])    Kidney transplant status 05/28/2013   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/11/2013   Precordial chest pain 02/10/2013   Awaiting organ transplant 09/19/2012    PCP: Loura Back, NP  REFERRING PROVIDER: Crist Fat, MD  REFERRING DIAG: N39.41 (ICD-10-CM) - Urge incontinence  THERAPY DIAG:  Other muscle spasm  Muscle weakness (generalized)  Rationale for Evaluation and Treatment:  Rehabilitation  ONSET DATE: 18 months ago  SUBJECTIVE:  SUBJECTIVE STATEMENT: Pt on time, front desk late to inform me that pt here Pt reports that she is trying to do her her exercises Feeling good, shoulder is better. Leaks anytime, even after she just emptied her bladder Interpreter Byrd Hesselbach present Leaking when she bends over and when she is carrying her grandson or something heavy Not drinking soda    Fluid intake: Yes: coffee and water, 4 glasses of water 16 glasses, soda     PAIN:  Are you having pain? Yes NPRS scale: 5/10 Pain location:  lower abdomen where ovaries used to be  Pain type: aching Pain description: intermittent   Aggravating factors: sweeping and mopping aggravates her low back pain and  Relieving factors: rest  PRECAUTIONS: None  RED FLAGS: None   WEIGHT BEARING RESTRICTIONS: No  FALLS:  Has patient fallen in last 6 months? 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  PLOF: Independent  PATIENT GOALS: to be pain free  PERTINENT HISTORY:  Kidney transplant and several other surgeries - please see above  Sexual abuse: No  BOWEL MOVEMENT: Pain with bowel movement: No Type of bowel movement:Type (Bristol Stool Scale) 3 Fully empty rectum: No Leakage: No Pads: No Fiber supplement: No  URINATION: Pain with urination: No Fully empty bladder: Yes:   Stream: Strong Urgency: Yes:   Frequency: to be asked Leakage: Urge to void, Walking to the bathroom, Coughing, Sneezing, Laughing, and Exercise Pads: Yes:    INTERCOURSE: n/a  PREGNANCY: Vaginal deliveries 3 Tearing Yes:   C-section deliveries  0 Currently pregnant No  PROLAPSE: None   OBJECTIVE:  Note: Objective measures were completed at Evaluation unless otherwise noted.     COGNITION: Overall cognitive status: Within functional limits for tasks assessed     SENSATION: Light touch: Appears intact Proprioception: Appears intact  MUSCLE LENGTH: Hamstrings: Right 70 deg; Left 70 deg   LUMBAR SPECIAL TESTS:  Straight leg raise test: Positive for tightness   POSTURE: rounded shoulders, forward head, decreased lumbar lordosis, and flexed trunk   PELVIC ALIGNMENT: seems even  LUMBARAROM/PROM: tight and stiff throughout  LOWER EXTREMITY ROM: tight throughout bilateral hips  LOWER EXTREMITY MMT:4/5 grossly throughout  PALPATION:   General  right shoulder limited AROM, painful- abd to max 90 degress Tight and tender abdominal scars- post hysterectomy scar most irritable                External Perineal Exam dry tissues                             Internal Pelvic Floor dry tissues present  Patient confirms identification and approves PT to assess internal pelvic floor and treatment Yes  PELVIC MMT:   MMT eval  Vaginal 3/5  Internal Anal Sphincter   External Anal Sphincter   Puborectalis   Diastasis Recti no  (Blank rows = not tested)        TONE: low  PROLAPSE: Posterior vaginal wall laxity  TODAY'S TREATMENT:  DATE: 09/10/23     Neuro reed- hip adduction with ball with transverse abdominis breath 20 reps Ball press with transverse abdominis breath seated STS with #5 with transverse abdominis breath  Rowing, extensions with transverse abdominis breath green and red theraband     PATIENT EDUCATION:  Education details: relevant anatomy, vaginal estrogen, bladder irritants, scar massage Person educated: Patient Education method: Explanation, Demonstration, Tactile  cues, Verbal cues, and Handouts Education comprehension: verbalized understanding and needs further education Trigger Point Dry Needling  Initial Treatment: Pt instructed on Dry Needling rational, procedures, and possible side effects. Pt instructed to expect mild to moderate muscle soreness later in the day and/or into the next day.  Pt instructed in methods to reduce muscle soreness. Pt instructed to continue prescribed HEP. Patient was educated on signs and symptoms of infection and other risk factors and advised to seek medical attention should they occur.  Patient verbalized understanding of these instructions and education.   Patient Verbal Consent Given: Yes Education Handout Provided: Yes Muscles Treated: right UT, right levator Electrical Stimulation Performed: No Treatment Response/Outcome: less pain   HOME EXERCISE PROGRAM: [id number] ASSESSMENT:  CLINICAL IMPRESSION: Pt did fairly well with her exercises, less shoulder pain helped to be in better form for pelvic floor and core exercises, more tolerable. Weakness still present, pt still fatigued.  She will continue to benefit from PT.  OBJECTIVE IMPAIRMENTS: decreased activity tolerance, decreased endurance, decreased mobility, difficulty walking, decreased ROM, decreased strength, increased fascial restrictions, increased muscle spasms, impaired flexibility, impaired tone, and pain.   ACTIVITY LIMITATIONS: continence, toileting, and reach over head  PARTICIPATION LIMITATIONS: community activity  PERSONAL FACTORS: Time since onset of injury/illness/exacerbation are also affecting patient's functional outcome.   REHAB POTENTIAL: Good  CLINICAL DECISION MAKING: Stable/uncomplicated  EVALUATION COMPLEXITY: Moderate   GOALS: Goals reviewed with patient? Yes  SHORT TERM GOALS: Target date: 08/20/2023    Pt will be I with scar massage Baseline: Goal status: INITIAL  2.  Pt will dem initial HEP Baseline:   Goal status: progressing  3.  Pt will report reducing mixed incontinence by 50% Baseline:  Goal status: progressing  4.  Pt will be I with bladder retraining and healthy bladder habits Baseline:  Goal status: met   LONG TERM GOALS: Target date: 09/17/2023    Pt will report max 2/10 low back pain Baseline:  Goal status: met  2.  Pt will soak 0 pads/ day Baseline:  Goal status: INITIAL  3.  Pt will be I with her advanced HEP Baseline:  Goal status: progressing  4.  Pt will report 0 pain abdominal scars Baseline:  Goal status: INITIAL  5.  Pt will report complete rectum emptying Baseline:  Goal status: progressing   PLAN:  PT FREQUENCY: 1-2x/week  PT DURATION: 8 weeks  PLANNED INTERVENTIONS: 97110-Therapeutic exercises, 97530- Therapeutic activity, 97112- Neuromuscular re-education, 97535- Self Care, [id number]- Manual therapy, Taping, Dry Needling, Joint mobilization, Joint manipulation, Spinal manipulation, Spinal mobilization, and Scar mobilization  PLAN FOR NEXT SESSION: urge drill- review and make sure she understands exercises, stretches, constipation strategies   Fermin Yan, PT 09/10/23 1:02 PM

## 2023-09-12 ENCOUNTER — Telehealth: Payer: Self-pay

## 2023-09-12 ENCOUNTER — Ambulatory Visit

## 2023-09-12 NOTE — Telephone Encounter (Signed)
 Call placed by interpreter to inform patient that she missed her appointment this morning at 8:45 am.  Requested patient call us back to let us know if she will be attending her next appt.  Interpreter provided date and time of next appointment.

## 2023-09-17 ENCOUNTER — Ambulatory Visit

## 2023-09-17 DIAGNOSIS — M47819 Spondylosis without myelopathy or radiculopathy, site unspecified: Secondary | ICD-10-CM | POA: Diagnosis not present

## 2023-09-17 DIAGNOSIS — R252 Cramp and spasm: Secondary | ICD-10-CM

## 2023-09-17 DIAGNOSIS — M25511 Pain in right shoulder: Secondary | ICD-10-CM

## 2023-09-17 DIAGNOSIS — M25611 Stiffness of right shoulder, not elsewhere classified: Secondary | ICD-10-CM

## 2023-09-17 DIAGNOSIS — M62830 Muscle spasm of back: Secondary | ICD-10-CM

## 2023-09-17 NOTE — Therapy (Signed)
 OUTPATIENT PHYSICAL THERAPY FEMALE PELVIC AND ORTHO TREATMENT NOTE    Patient Name: Cynthia [name] MRN: [id number] DOB:April 24, 1971, 53 y.o., female Today's Date: 09/17/2023  END OF SESSION:  PT End of Session - 09/17/23 1432     Visit Number 9    Number of Visits 10    Date for PT Re-Evaluation 10/24/23    Authorization Type [employer] approved 10 visits 07/23/23-09/17/23 [id number]    Authorization Time Period Requested more visits on 09/17/23    Authorization - Visit Number 9    Authorization - Number of Visits 10    Progress Note Due on Visit 20    PT Start Time 1200    PT Stop Time 1230    PT Time Calculation (min) 30 min    Activity Tolerance Patient limited by fatigue    Behavior During Therapy [facility] for tasks assessed/performed                  Past Medical History:  Diagnosis Date   Allergy    pollen   Anemia    Anxiety    BRCA2 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 03/01/2021   Breast cancer [facility])    Cataract    surgical repair bilateral   Complication of anesthesia    not herself for 2 days after last surgery (brest lumpectomy)   Coronary artery disease    Depression    ESRD on hemodialysis (HCC)    Home HD 5x per week- not on dialysis now had tramsplant 4/17   Family history of ovarian cancer 02/22/2021   GERD (gastroesophageal reflux disease)    Hearing loss 2017   right ear   History of blood transfusion    transfusion reaction   Hyperlipidemia    Hypertension    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 per pt-dr levy also II; Per patient 08/18/21 she is Type 2   Kidney transplant recipient    Leukocytosis 06/13/2021   Neuromuscular disorder (HCC)    NEUROPATHY   Sleep apnea    Past Surgical History:  Procedure Laterality Date   ABDOMINAL HYSTERECTOMY     BREAST EXCISIONAL BIOPSY Right 08/2015   BREAST LUMPECTOMY WITH RADIOACTIVE SEED LOCALIZATION Right 09/14/2015   Procedure: RIGHT BREAST  LUMPECTOMY WITH RADIOACTIVE SEED LOCALIZATION;  Surgeon: Manus Rudd, MD;  Location: [facility];  Service: General;  Laterality: Right;   BREAST LUMPECTOMY WITH RADIOACTIVE SEED LOCALIZATION Left 03/02/2021   Procedure: LEFT BREAST SEED LUMPECTOMY LEFT SENTINEL LYMPH NODE MAPPING;  Surgeon: Harriette Bouillon, MD;  Location: [facility];  Service: General;  Laterality: Left;  GEN AND PEC BLOCK   BREAST SURGERY Bilateral    biopsy bilateral   CARDIAC CATHETERIZATION     CATARACT EXTRACTION Bilateral    bilateral   CHOLECYSTECTOMY     CYST REMOVAL NECK     DIALYSIS FISTULA CREATION Left    EYE SURGERY Bilateral    lazer   kidney transplant     LEFT HEART CATH AND CORONARY ANGIOGRAPHY N/A 03/04/2019   Procedure: LEFT HEART CATH AND CORONARY ANGIOGRAPHY;  Surgeon: Swaziland, Peter M, MD;  Location: [facility];  Service: Cardiovascular;  Laterality: N/A;   LEFT HEART CATHETERIZATION WITH CORONARY ANGIOGRAM N/A 03/30/2014   Procedure: LEFT HEART CATHETERIZATION WITH CORONARY ANGIOGRAM;  Surgeon: Lesleigh Noe, MD;  Location: [facility] CATH LAB;  Service: Cardiovascular;  Laterality: N/A;   LIPOMA EXCISION N/A 02/06/2017  Procedure: EXCISION POSTERIOR NECK SEBACEOUS CYST;  Surgeon: Abigail Miyamoto, MD;  Location: [facility] OR;  Service: General;  Laterality: N/A;   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   Procedure: REPAIR OF ARTERIOVENOUS FISTULA ANEURYSM;  Surgeon: Nada Libman, MD;  Location: [facility] OR;  Service: Vascular;  Laterality: Left;   REVISON OF ARTERIOVENOUS FISTULA Left 09/28/2013   Procedure: EXCISE ESCHAR LEFT ARM  ARTERIOVENOUS FISTULA WITH PLICATION OF LEFT ARM ARTERIOVENOUS FISTULA;  Surgeon: Sherren Kerns, MD;  Location: [facility] OR;  Service: Vascular;  Laterality: Left;   REVISON OF ARTERIOVENOUS FISTULA Left 08/26/2015   Procedure: RESECTION ANEURYSM OF LEFT ARM ARTERIOVENOUS FISTULA  ;  Surgeon: Nada Libman, MD;  Location: [facility]  OR;  Service: Vascular;  Laterality: Left;   ROBOTIC ASSISTED BILATERAL SALPINGO OOPHERECTOMY N/A 07/11/2022   Procedure: XI ROBOTIC ASSISTED BILATERAL SALPINGO OOPHORECTOMY;  Surgeon: Carver Fila, MD;  Location: [facility] ORS;  Service: Gynecology;  Laterality: N/A;   SIMPLE MASTECTOMY WITH AXILLARY SENTINEL NODE BIOPSY Bilateral 08/22/2021   Procedure: BILATERAL SIMPLE MASTECTOMY;  Surgeon: Harriette Bouillon, MD;  Location: [facility] OR;  Service: General;  Laterality: Bilateral;   TUBAL LIGATION     Patient Active Problem List   Diagnosis Date Noted   Gastroesophageal reflux disease without esophagitis 07/28/2022   AKI (acute kidney injury/renal transplant on immunosuppresants  08/24/2021   BRCA2 gene mutation positive s/p bilateral masectomy  08/22/2021   PICC (peripherally inserted central catheter) in place 06/13/2021   Stage 3a chronic kidney disease (CKD) (HCC) 05/28/2021   Thrombocytopenia (HCC) 05/28/2021   Aortic atherosclerosis (HCC) 03/07/2021   Hypercholesterolemia 03/07/2021   Family history of ovarian cancer 02/22/2021   Malignant neoplasm of lower-inner quadrant of left breast in female, estrogen receptor positive (HCC) 02/15/2021   Foot pain, bilateral 07/28/2020   Insulin dependent type 2 diabetes mellitus (HCC) 06/06/2019   DKA (diabetic ketoacidosis) (HCC) 06/02/2019   HTN (hypertension) 03/04/2019   Abnormal nuclear stress test 03/04/2019   Diabetic nephropathy (HCC) 03/03/2019   History of renal transplant 11/18/2015   Diabetic retinopathy associated with type 2 diabetes mellitus (HCC) 04/13/2015   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   Chronic constipation 07/28/2013   Paresthesias 07/28/2013   Diabetes mellitus type I (HCC)    Kidney transplant status 05/28/2013   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/11/2013   Precordial chest pain 02/10/2013   Awaiting organ transplant 09/19/2012    PCP: Loura Back, NP  REFERRING  PROVIDER: Crist Fat, MD  REFERRING DIAG: N39.41 (ICD-10-CM) - Urge incontinence  THERAPY DIAG:  Muscle spasm of back  Cramp and spasm  Acute pain of right shoulder  Stiffness of right shoulder, not elsewhere classified  Rationale for Evaluation and Treatment: Rehabilitation  ONSET DATE: 18 months ago  SUBJECTIVE:  SUBJECTIVE STATEMENT: Patient reports her right shoulder is most painful at the moment..     Fluid intake: Yes: coffee and water, 4 glasses of water 16 glasses, soda     PAIN:  09/17/23  Are you having pain? Yes NPRS scale: 6/10 Pain location:  lower abdomen where ovaries used to be  Pain type: aching Pain description: intermittent   Aggravating factors: sweeping and mopping aggravates her low back pain and  Relieving factors: rest  PRECAUTIONS: None  RED FLAGS: None   WEIGHT BEARING RESTRICTIONS: No  FALLS:  Has patient fallen in last 6 months? 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  PLOF: Independent  PATIENT GOALS: to be pain free  PERTINENT HISTORY:  Kidney transplant and several other surgeries - please see above  Sexual abuse: No  BOWEL MOVEMENT: Pain with bowel movement: No Type of bowel movement:Type (Bristol Stool Scale) 3 Fully empty rectum: No Leakage: No Pads: No Fiber supplement: No  URINATION: Pain with urination: No Fully empty bladder: Yes:   Stream: Strong Urgency: Yes:   Frequency: to be asked Leakage: Urge to void, Walking to the bathroom, Coughing, Sneezing, Laughing, and Exercise Pads: Yes:    INTERCOURSE: n/a  PREGNANCY: Vaginal deliveries 3 Tearing Yes:    C-section deliveries 0 Currently pregnant No  PROLAPSE: None   OBJECTIVE:  Note: Objective measures were completed at Evaluation unless otherwise noted.  Quick Dash  08/29/23 61.4/100= 61.4%   COGNITION: Overall cognitive status: Within functional limits for tasks assessed     SENSATION: Light touch: Appears intact Proprioception: Appears intact  MUSCLE LENGTH: Initial eval: Hamstrings: Right 70 deg; Left 70 deg  08/29/23: no change   LUMBAR SPECIAL TESTS:  Straight leg raise test: Positive for tightness  SHOULDER SPECIAL TESTS:  Hawkins/Kennedy: positive  Apprehension: negative  Drop Arm: negative  Empty Can: positive  POSTURE: rounded shoulders, forward head, decreased lumbar lordosis, and flexed trunk   PELVIC ALIGNMENT: seems even  LUMBARAROM/PROM: tight and stiff throughout  SHOULDER AROM: Flexion: 150 Abduction: 145 ER: to sub occipital area IR: to T12  09/17/23: no changes (just evaluated the shoulder)  LOWER EXTREMITY ROM: tight throughout bilateral hips  LOWER EXTREMITY MMT:4/5 grossly throughout  PALPATION:   General  right shoulder limited AROM, painful- abd to max 90 degress Tight and tender abdominal scars- post hysterectomy scar most irritable                External Perineal Exam dry tissues                             Internal Pelvic Floor dry tissues present  Patient confirms identification and approves PT to assess internal pelvic floor and treatment Yes  PELVIC MMT:   MMT eval  Vaginal 3/5  Internal Anal Sphincter   External Anal Sphincter   Puborectalis   Diastasis Recti no  (Blank rows = not tested)   SHOULDER MMT: Inconclusive due to pain level.  Generally 3/5 throughout right shoulder.       TONE: low  PROLAPSE: Posterior vaginal wall laxity  TODAY'S TREATMENT:  DATE: 09/17/23 (patient was  15 min late for appt) Treadmill x 5 min  Re-assessment completed Supine AAROM right shoulder all planes of motion with dowel x 10 each  PROM all planes of motion  right shoulder Seated shoulder rows with red tband x 20 (added to HEP) Seated bilateral shoulder ER 2 x 10 with red band  (added to HEP)  (Treatment time limited)  DATE: 08/29/23 Re-assessment of back pain by ortho PT Assessment of right shoulder by ortho PT   Educated patient on the mechanics of the shoulder and possible rotator cuff deficit.  Suggested avoiding excessive overhead activity, avoid weighted shoulder elevation with arm abducted such as reaching behind the seat of a car to access her purse.  Suggested ice for pain relief.   Added supine hamstring stretch, seated piriformis stretch and standing quad stretch for HEP.  Each stretch 3 times each LE holding 20-30 sec each.     Manual- dry needling and passive ROM right shoulder, STM right UT There ex- shoulder PROM- flexion, IR in standing and flexion in sitting on pulleys Upper trap stretch Levator stretch HA with red thera band pain free range ER with TB    PATIENT EDUCATION:  Education details: relevant anatomy, vaginal estrogen, bladder irritants, scar massage Person educated: Patient Education method: Explanation, Demonstration, Tactile cues, Verbal cues, and Handouts Education comprehension: verbalized understanding and needs further education Trigger Point Dry Needling  Initial Treatment: Pt instructed on Dry Needling rational, procedures, and possible side effects. Pt instructed to expect mild to moderate muscle soreness later in the day and/or into the next day.  Pt instructed in methods to reduce muscle soreness. Pt instructed to continue prescribed HEP. Patient was educated on signs and symptoms of infection and other risk factors and advised to seek medical attention should they occur.  Patient verbalized understanding of these instructions and  education.   Patient Verbal Consent Given: Yes Education Handout Provided: Yes Muscles Treated: right UT, right levator Electrical Stimulation Performed: No Treatment Response/Outcome: less pain   HOME EXERCISE PROGRAM: Access Code: [email or web address] URL: [email or web address] Date: 09/17/2023 Prepared by: Mikey Kirschner  Exercises - Abdominal Press into [place]  - 1 x daily - 7 x weekly - 3 sets - 10 reps - Sit to Stand with Pelvic Floor Contraction  - 1 x daily - 7 x weekly - 3 sets - 10 reps - Quadruped Exhale with Pelvic Floor Contraction  - 1 x daily - 7 x weekly - 3 sets - 10 reps - Seated Isometric Hip Adduction with Pelvic Floor Contraction  - 1 x daily - 7 x weekly - 3 sets - 10 reps - Rowing with Pelvic Floor Contraction  - 1 x daily - 7 x weekly - 3 sets - 10 reps - Shoulder extension with resistance - Neutral  - 1 x daily - 7 x weekly - 3 sets - 10 reps - Horizontal abd with TB with TRA breath  - 1 x daily - 7 x weekly - 3 sets - 10 reps - Standing Shoulder Row with Anchored Resistance  - 1 x daily - 7 x weekly - 3 sets - 10 reps - Shoulder External Rotation and Scapular Retraction with Resistance  - 1 x daily - 7 x weekly - 3 sets - 10 reps  Patient Education - Urinary Urge Control Techniques - Lifestyle Changes to Support Urinary and Bladder Health - Urinary Urge Control Techniques  [id number] ASSESSMENT:  CLINICAL IMPRESSION: Cynthia [name] was 15 min late  for appt.  She is c/o right shoulder pain persists.  Treatment time was limited.  We were able to add several exercises to HEP.  Interpreter present.  Will continue based on insurance approval.    OBJECTIVE IMPAIRMENTS: decreased activity tolerance, decreased endurance, decreased mobility, difficulty walking, decreased ROM, decreased strength, increased fascial restrictions, increased muscle spasms, impaired flexibility, impaired tone, and pain.   ACTIVITY LIMITATIONS: continence, toileting, and reach over  head  PARTICIPATION LIMITATIONS: community activity  PERSONAL FACTORS: Time since onset of injury/illness/exacerbation are also affecting patient's functional outcome.   REHAB POTENTIAL: Good  CLINICAL DECISION MAKING: Stable/uncomplicated  EVALUATION COMPLEXITY: Moderate   GOALS: Goals reviewed with patient? Yes  SHORT TERM GOALS: Target date: 08/20/2023    Pt will be I with scar massage Baseline: Goal status: INITIAL  2.  Pt will dem initial HEP Baseline:  Goal status: progressing  3.  Pt will report reducing mixed incontinence by 50% Baseline:  Goal status: progressing  4.  Pt will be I with bladder retraining and healthy bladder habits Baseline:  Goal status: met  5. Patient will be able to reach overhead into cabinets and on top of shelves without pain   Baseline:   Goal status: INITIAL   LONG TERM GOALS: Target date: 09/17/2023 (ortho 10/24/23)    Pt will report max 2/10 low back pain Baseline:  Goal status: INITIAL  2.  Pt will soak 0 pads/ day Baseline:  Goal status: INITIAL  3.  Pt will be I with her advanced HEP Baseline:  Goal status: INITIAL  4.  Pt will report 0 pain abdominal scars Baseline:  Goal status: INITIAL  5.  Pt will report complete rectum emptying Baseline:  Goal status: INITIAL  6. Patient will have full right shoulder ROM with no pain  Baseline:  Goal status: INITIAL  7. Patient to be able to do all ADL's and IADL's without shoulder pain.   Baseline:  Goal status: INITIAL   PLAN:  PT FREQUENCY: 1-2x/week  PT DURATION: 8 weeks  PLANNED INTERVENTIONS: 97110-Therapeutic exercises, 97530- Therapeutic activity, 97112- Neuromuscular re-education, 97535- Self Care, 16109- Manual therapy, Taping, Dry Needling, Joint mobilization, Joint manipulation, Spinal manipulation, Spinal mobilization, and Scar mobilization  PLAN FOR NEXT SESSION: UBE, begin right shoulder strengthening and scapular stabilization, urge drill- review  and make sure she understands exercises, stretches, constipation strategies   Cynthia Ignasiak B. Cosette [name], PT 09/17/23 4:14 PM [facility] [address] [place], [place] [address] Phone # [phone] Fax [phone]

## 2023-09-23 ENCOUNTER — Ambulatory Visit

## 2023-09-23 DIAGNOSIS — M6281 Muscle weakness (generalized): Secondary | ICD-10-CM

## 2023-09-23 DIAGNOSIS — M62830 Muscle spasm of back: Secondary | ICD-10-CM

## 2023-09-23 DIAGNOSIS — M25611 Stiffness of right shoulder, not elsewhere classified: Secondary | ICD-10-CM

## 2023-09-23 DIAGNOSIS — M62838 Other muscle spasm: Secondary | ICD-10-CM

## 2023-09-23 DIAGNOSIS — M25511 Pain in right shoulder: Secondary | ICD-10-CM

## 2023-09-23 DIAGNOSIS — M47819 Spondylosis without myelopathy or radiculopathy, site unspecified: Secondary | ICD-10-CM | POA: Diagnosis not present

## 2023-09-23 DIAGNOSIS — R293 Abnormal posture: Secondary | ICD-10-CM

## 2023-09-23 DIAGNOSIS — R252 Cramp and spasm: Secondary | ICD-10-CM

## 2023-09-23 NOTE — Therapy (Signed)
 OUTPATIENT PHYSICAL THERAPY FEMALE PELVIC AND ORTHO TREATMENT NOTE    Patient Name: Cynthia [name] MRN: [id number] DOB:[birth year], 53 y.o., female Today's Date: 09/23/2023  END OF SESSION:  PT End of Session - 09/23/23 0943     Visit Number 10    Number of Visits 10    Date for PT Re-Evaluation 10/24/23    Authorization Type [employer] Approved 8 more visits, 1 ERO 08/29/2023-10/24/2023-    Authorization - Visit Number 1    Authorization - Number of Visits 8    Progress Note Due on Visit 20    PT Start Time 0941    PT Stop Time 1029    PT Time Calculation (min) 48 min    Activity Tolerance Patient limited by fatigue    Behavior During Therapy [facility] for tasks assessed/performed                  Past Medical History:  Diagnosis Date   Allergy    pollen   Anemia    Anxiety    BRCA2 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 03/01/2021   Breast cancer [facility])    Cataract    surgical repair bilateral   Complication of anesthesia    not herself for 2 days after last surgery (brest lumpectomy)   Coronary artery disease    Depression    ESRD on hemodialysis (HCC)    Home HD 5x per week- not on dialysis now had tramsplant 4/17   Family history of ovarian cancer 02/22/2021   GERD (gastroesophageal reflux disease)    Hearing loss 2017   right ear   History of blood transfusion    transfusion reaction   Hyperlipidemia    Hypertension    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 per pt-dr levy also II; Per patient 08/18/21 she is Type 2   Kidney transplant recipient    Leukocytosis 06/13/2021   Neuromuscular disorder (HCC)    NEUROPATHY   Sleep apnea    Past Surgical History:  Procedure Laterality Date   ABDOMINAL HYSTERECTOMY     BREAST EXCISIONAL BIOPSY Right 08/2015   BREAST LUMPECTOMY WITH RADIOACTIVE SEED LOCALIZATION Right 09/14/2015   Procedure: RIGHT BREAST LUMPECTOMY WITH RADIOACTIVE SEED LOCALIZATION;  Surgeon: Manus Rudd, MD;  Location: [facility];  Service: General;  Laterality: Right;   BREAST LUMPECTOMY WITH RADIOACTIVE SEED LOCALIZATION Left 03/02/2021   Procedure: LEFT BREAST SEED LUMPECTOMY LEFT SENTINEL LYMPH NODE MAPPING;  Surgeon: Harriette Bouillon, MD;  Location:  SURGERY CENTER;  Service: General;  Laterality: Left;  GEN AND PEC BLOCK   BREAST SURGERY Bilateral    biopsy bilateral   CARDIAC CATHETERIZATION     CATARACT EXTRACTION Bilateral    bilateral   CHOLECYSTECTOMY     CYST REMOVAL NECK     DIALYSIS FISTULA CREATION Left    EYE SURGERY Bilateral    lazer   kidney transplant     LEFT HEART CATH AND CORONARY ANGIOGRAPHY N/A 03/04/2019   Procedure: LEFT HEART CATH AND CORONARY ANGIOGRAPHY;  Surgeon: Swaziland, Peter M, MD;  Location: [facility];  Service: Cardiovascular;  Laterality: N/A;   LEFT HEART CATHETERIZATION WITH CORONARY ANGIOGRAM N/A 03/30/2014   Procedure: LEFT HEART CATHETERIZATION WITH CORONARY ANGIOGRAM;  Surgeon: Lesleigh Noe, MD;  Location: [facility] CATH LAB;  Service: Cardiovascular;  Laterality: N/A;   LIPOMA EXCISION N/A 02/06/2017   Procedure: EXCISION POSTERIOR NECK SEBACEOUS CYST;  Surgeon:  Abigail Miyamoto, MD;  Location: [facility] OR;  Service: General;  Laterality: N/A;   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   Procedure: REPAIR OF ARTERIOVENOUS FISTULA ANEURYSM;  Surgeon: Nada Libman, MD;  Location: [facility] OR;  Service: Vascular;  Laterality: Left;   REVISON OF ARTERIOVENOUS FISTULA Left 09/28/2013   Procedure: EXCISE ESCHAR LEFT ARM  ARTERIOVENOUS FISTULA WITH PLICATION OF LEFT ARM ARTERIOVENOUS FISTULA;  Surgeon: Sherren Kerns, MD;  Location: [facility] OR;  Service: Vascular;  Laterality: Left;   REVISON OF ARTERIOVENOUS FISTULA Left 08/26/2015   Procedure: RESECTION ANEURYSM OF LEFT ARM ARTERIOVENOUS FISTULA  ;  Surgeon: Nada Libman, MD;  Location: [facility] OR;  Service: Vascular;  Laterality: Left;   ROBOTIC ASSISTED  BILATERAL SALPINGO OOPHERECTOMY N/A 07/11/2022   Procedure: XI ROBOTIC ASSISTED BILATERAL SALPINGO OOPHORECTOMY;  Surgeon: Carver Fila, MD;  Location: [facility];  Service: Gynecology;  Laterality: N/A;   SIMPLE MASTECTOMY WITH AXILLARY SENTINEL NODE BIOPSY Bilateral 08/22/2021   Procedure: BILATERAL SIMPLE MASTECTOMY;  Surgeon: Harriette Bouillon, MD;  Location: [facility] OR;  Service: General;  Laterality: Bilateral;   TUBAL LIGATION     Patient Active Problem List   Diagnosis Date Noted   Gastroesophageal reflux disease without esophagitis 07/28/2022   AKI (acute kidney injury/renal transplant on immunosuppresants  08/24/2021   BRCA2 gene mutation positive s/p bilateral masectomy  08/22/2021   PICC (peripherally inserted central catheter) in place 06/13/2021   Stage 3a chronic kidney disease (CKD) (HCC) 05/28/2021   Thrombocytopenia (HCC) 05/28/2021   Aortic atherosclerosis (HCC) 03/07/2021   Hypercholesterolemia 03/07/2021   Family history of ovarian cancer 02/22/2021   Malignant neoplasm of lower-inner quadrant of left breast in female, estrogen receptor positive (HCC) 02/15/2021   Foot pain, bilateral 07/28/2020   Insulin dependent type 2 diabetes mellitus (HCC) 06/06/2019   DKA (diabetic ketoacidosis) (HCC) 06/02/2019   HTN (hypertension) 03/04/2019   Abnormal nuclear stress test 03/04/2019   Diabetic nephropathy (HCC) 03/03/2019   History of renal transplant 11/18/2015   Diabetic retinopathy associated with type 2 diabetes mellitus (HCC) 04/13/2015   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   Chronic constipation 07/28/2013   Paresthesias 07/28/2013   Diabetes mellitus type I (HCC)    Kidney transplant status 05/28/2013   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/11/2013   Precordial chest pain 02/10/2013   Awaiting organ transplant 09/19/2012    PCP: Loura Back, NP  REFERRING PROVIDER: Crist Fat, MD  REFERRING DIAG: N39.41  (ICD-10-CM) - Urge incontinence  THERAPY DIAG:  Muscle spasm of back  Cramp and spasm  Acute pain of right shoulder  Stiffness of right shoulder, not elsewhere classified  Other muscle spasm  Muscle weakness (generalized)  Abnormal posture  Rationale for Evaluation and Treatment: Rehabilitation  ONSET DATE: 18 months ago  SUBJECTIVE:  SUBJECTIVE STATEMENT: Patient reports her right shoulder is most painful at the moment..     Fluid intake: Yes: coffee and water, 4 glasses of water 16 glasses, soda     PAIN:  09/17/23  Are you having pain? Yes NPRS scale: 6/10 Pain location:  lower abdomen where ovaries used to be  Pain type: aching Pain description: intermittent   Aggravating factors: sweeping and mopping aggravates her low back pain and  Relieving factors: rest  PRECAUTIONS: None  RED FLAGS: None   WEIGHT BEARING RESTRICTIONS: No  FALLS:  Has patient fallen in last 6 months? 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  PLOF: Independent  PATIENT GOALS: to be pain free  PERTINENT HISTORY:  Kidney transplant and several other surgeries - please see above  Sexual abuse: No  BOWEL MOVEMENT: Pain with bowel movement: No Type of bowel movement:Type (Bristol Stool Scale) 3 Fully empty rectum: No Leakage: No Pads: No Fiber supplement: No  URINATION: Pain with urination: No Fully empty bladder: Yes:   Stream: Strong Urgency: Yes:   Frequency: to be asked Leakage: Urge to void, Walking to the bathroom, Coughing, Sneezing, Laughing, and Exercise Pads: Yes:    INTERCOURSE: n/a  PREGNANCY: Vaginal deliveries  3 Tearing Yes:   C-section deliveries 0 Currently pregnant No  PROLAPSE: None   OBJECTIVE:  Note: Objective measures were completed at Evaluation unless otherwise noted.  Quick Dash  08/29/23 61.4/100= 61.4%   COGNITION: Overall cognitive status: Within functional limits for tasks assessed     SENSATION: Light touch: Appears intact Proprioception: Appears intact  MUSCLE LENGTH: Initial eval: Hamstrings: Right 70 deg; Left 70 deg  08/29/23: no change   LUMBAR SPECIAL TESTS:  Straight leg raise test: Positive for tightness  SHOULDER SPECIAL TESTS:  Hawkins/Kennedy: positive  Apprehension: negative  Drop Arm: negative  Empty Can: positive  POSTURE: rounded shoulders, forward head, decreased lumbar lordosis, and flexed trunk   PELVIC ALIGNMENT: seems even  LUMBARAROM/PROM: tight and stiff throughout  SHOULDER AROM: Flexion: 150 Abduction: 145 ER: to sub occipital area IR: to T12  09/17/23: no changes (just evaluated the shoulder)  LOWER EXTREMITY ROM: tight throughout bilateral hips  LOWER EXTREMITY MMT:4/5 grossly throughout  PALPATION:   General  right shoulder limited AROM, painful- abd to max 90 degress Tight and tender abdominal scars- post hysterectomy scar most irritable                External Perineal Exam dry tissues                             Internal Pelvic Floor dry tissues present  Patient confirms identification and approves PT to assess internal pelvic floor and treatment Yes  PELVIC MMT:   MMT eval  Vaginal 3/5  Internal Anal Sphincter   External Anal Sphincter   Puborectalis   Diastasis Recti no  (Blank rows = not tested)   SHOULDER MMT: Inconclusive due to pain level.  Generally 3/5 throughout right shoulder.       TONE: low  PROLAPSE: Posterior vaginal wall laxity  TODAY'S TREATMENT:  DATE:  09/23/23 (patient was 6 min late for appt) Education via interpreter regarding shoulder anatomy and faulty mechanics of rotator cuff injuries/impingement Suggested precautions such as repetitive overhead reaching Explained the precautions provider must consider if she needs cortisone due her blood sugar being difficult to control.  This is likely why they are not eager to do cortisone.   Reviewed current HEP Added prone shoulder ext, rows and horizontal abduction with 2 lbs 2 x 10 Sidelying shoulder ER with 2 lbs 2 x 10 Supine serratus punch 2 x 10 with 2 lbs Standing shoulder flexion and scaption 2 x 10 Ice to right shoulder in supine x 10 min  DATE: 09/17/23 (patient was 15 min late for appt) Treadmill x 5 min  Re-assessment completed Supine AAROM right shoulder all planes of motion with dowel x 10 each  PROM all planes of motion  right shoulder Seated shoulder rows with red tband x 20 (added to HEP) Seated bilateral shoulder ER 2 x 10 with red band  (added to HEP)  (Treatment time limited)  DATE: 08/29/23 Re-assessment of back pain by ortho PT Assessment of right shoulder by ortho PT   Educated patient on the mechanics of the shoulder and possible rotator cuff deficit.  Suggested avoiding excessive overhead activity, avoid weighted shoulder elevation with arm abducted such as reaching behind the seat of a car to access her purse.  Suggested ice for pain relief.   Added supine hamstring stretch, seated piriformis stretch and standing quad stretch for HEP.  Each stretch 3 times each LE holding 20-30 sec each.     Manual- dry needling and passive ROM right shoulder, STM right UT There ex- shoulder PROM- flexion, IR in standing and flexion in sitting on pulleys Upper trap stretch Levator stretch HA with red thera band pain free range ER with TB    PATIENT EDUCATION:  Education details: relevant anatomy, vaginal estrogen, bladder irritants, scar massage Person educated:  Patient Education method: Explanation, Demonstration, Tactile cues, Verbal cues, and Handouts Education comprehension: verbalized understanding and needs further education Trigger Point Dry Needling  Initial Treatment: Pt instructed on Dry Needling rational, procedures, and possible side effects. Pt instructed to expect mild to moderate muscle soreness later in the day and/or into the next day.  Pt instructed in methods to reduce muscle soreness. Pt instructed to continue prescribed HEP. Patient was educated on signs and symptoms of infection and other risk factors and advised to seek medical attention should they occur.  Patient verbalized understanding of these instructions and education.   Patient Verbal Consent Given: Yes Education Handout Provided: Yes Muscles Treated: right UT, right levator Electrical Stimulation Performed: No Treatment Response/Outcome: less pain   HOME EXERCISE PROGRAM: Access Code: [email or web address] URL: [email or web address] Date: 09/23/2023 Prepared by: Mikey Kirschner  Exercises - Abdominal Press into [place]  - 1 x daily - 7 x weekly - 3 sets - 10 reps - Sit to Stand with Pelvic Floor Contraction  - 1 x daily - 7 x weekly - 3 sets - 10 reps - Quadruped Exhale with Pelvic Floor Contraction  - 1 x daily - 7 x weekly - 3 sets - 10 reps - Seated Isometric Hip Adduction with Pelvic Floor Contraction  - 1 x daily - 7 x weekly - 3 sets - 10 reps - Rowing with Pelvic Floor Contraction  - 1 x daily - 7 x weekly - 3 sets - 10 reps - Shoulder extension with resistance - Neutral  - 1  x daily - 7 x weekly - 3 sets - 10 reps - Horizontal abd with TB with TRA breath  - 1 x daily - 7 x weekly - 3 sets - 10 reps - Standing Shoulder Row with Anchored Resistance  - 1 x daily - 7 x weekly - 3 sets - 10 reps - Shoulder External Rotation and Scapular Retraction with Resistance  - 1 x daily - 7 x weekly - 3 sets - 10 reps - Prone Shoulder Extension - Single Arm  - 2 x  daily - 7 x weekly - 2 sets - 10 reps - Prone Shoulder Row  - 2 x daily - 7 x weekly - 2 sets - 10 reps - Prone Single Arm Shoulder Horizontal Abduction with Scapular Retraction and Palm Down  - 2 x daily - 7 x weekly - 2 sets - 10 reps - Sidelying Shoulder External Rotation  - 2 x daily - 7 x weekly - 2 sets - 10 reps - Single Arm Serratus Punches in Supine with Dumbbell  - 2 x daily - 7 x weekly - 2 sets - 10 reps - Standing Shoulder Flexion to 90 Degrees with Dumbbells  - 2 x daily - 7 x weekly - 2 sets - 10 reps - Standing Shoulder Scaption  - 2 x daily - 7 x weekly - 2 sets - 10 reps  Patient Education - Urinary Urge Control Techniques - Lifestyle Changes to Support Urinary and Bladder Health - Urinary Urge Control Techniques [id number] ASSESSMENT:  CLINICAL IMPRESSION: Reyann was a few minutes late for appt.  She is c/o right shoulder pain persists.  No significant improvement.  We added several exercises to HEP.  Interpreter present. Spent a lot of time educating patient on anatomy of the shoulder and about cortisone and the precautions provider would be considering.  This is likely why they have not suggested this just yet due to her blood sugar regulation issues.  She was able to complete all exercises but effort was minimal in the beginning due to pain.  However, she seemed to have less pain as she did more reps of each exercise. Insurance auth approved 8 more visits after 08/29/23. (6 left after today)  She would benefit from continuing skilled PT to resolve shoulder pain and meet goals states below.    OBJECTIVE IMPAIRMENTS: decreased activity tolerance, decreased endurance, decreased mobility, difficulty walking, decreased ROM, decreased strength, increased fascial restrictions, increased muscle spasms, impaired flexibility, impaired tone, and pain.   ACTIVITY LIMITATIONS: continence, toileting, and reach over head  PARTICIPATION LIMITATIONS: community activity  PERSONAL FACTORS:  Time since onset of injury/illness/exacerbation are also affecting patient's functional outcome.   REHAB POTENTIAL: Good  CLINICAL DECISION MAKING: Stable/uncomplicated  EVALUATION COMPLEXITY: Moderate   GOALS: Goals reviewed with patient? Yes  SHORT TERM GOALS: Target date: 08/20/2023    Pt will be I with scar massage Baseline: Goal status: INITIAL  2.  Pt will dem initial HEP Baseline:  Goal status: progressing  3.  Pt will report reducing mixed incontinence by 50% Baseline:  Goal status: progressing  4.  Pt will be I with bladder retraining and healthy bladder habits Baseline:  Goal status: met  5. Patient will be able to reach overhead into cabinets and on top of shelves without pain   Baseline:   Goal status: INITIAL   LONG TERM GOALS: Target date: 09/17/2023 (ortho 10/24/23)    Pt will report max 2/10 low back pain Baseline:  Goal  status: INITIAL  2.  Pt will soak 0 pads/ day Baseline:  Goal status: INITIAL  3.  Pt will be I with her advanced HEP Baseline:  Goal status: INITIAL  4.  Pt will report 0 pain abdominal scars Baseline:  Goal status: INITIAL  5.  Pt will report complete rectum emptying Baseline:  Goal status: INITIAL  6. Patient will have full right shoulder ROM with no pain  Baseline:  Goal status: INITIAL  7. Patient to be able to do all ADL's and IADL's without shoulder pain.   Baseline:  Goal status: INITIAL   PLAN:  PT FREQUENCY: 1-2x/week  PT DURATION: 8 weeks  PLANNED INTERVENTIONS: 97110-Therapeutic exercises, 97530- Therapeutic activity, 97112- Neuromuscular re-education, 97535- Self Care, 16109- Manual therapy, Taping, Dry Needling, Joint mobilization, Joint manipulation, Spinal manipulation, Spinal mobilization, and Scar mobilization  PLAN FOR NEXT SESSION: UBE, assess response to new exercises added to HEP, progress right shoulder strengthening and scapular stabilization, urge drill- review and make sure she  understands exercises, stretches, constipation strategies   Victorino Dike B. Verlisa Vara, PT 09/23/23 10:52 AM [facility] [address] [place], [place] [address] Phone # [phone] Fax [phone]

## 2023-09-24 ENCOUNTER — Ambulatory Visit: Admitting: Physical Therapy

## 2023-09-27 ENCOUNTER — Encounter

## 2023-10-03 ENCOUNTER — Emergency Department (HOSPITAL_BASED_OUTPATIENT_CLINIC_OR_DEPARTMENT_OTHER)
Admission: EM | Admit: 2023-10-03 | Discharge: 2023-10-03 | Disposition: A | Discharge status: Home / Self Care | Attending: Emergency Medicine | Admitting: Emergency Medicine

## 2023-10-03 ENCOUNTER — Other Ambulatory Visit: Payer: Self-pay

## 2023-10-03 ENCOUNTER — Other Ambulatory Visit (HOSPITAL_BASED_OUTPATIENT_CLINIC_OR_DEPARTMENT_OTHER): Payer: Self-pay

## 2023-10-03 ENCOUNTER — Ambulatory Visit: Attending: Registered Nurse

## 2023-10-03 ENCOUNTER — Emergency Department (HOSPITAL_BASED_OUTPATIENT_CLINIC_OR_DEPARTMENT_OTHER): Admitting: Radiology

## 2023-10-03 ENCOUNTER — Encounter (HOSPITAL_BASED_OUTPATIENT_CLINIC_OR_DEPARTMENT_OTHER): Payer: Self-pay | Admitting: Emergency Medicine

## 2023-10-03 DIAGNOSIS — Z794 Long term (current) use of insulin: Secondary | ICD-10-CM | POA: Insufficient documentation

## 2023-10-03 DIAGNOSIS — E119 Type 2 diabetes mellitus without complications: Secondary | ICD-10-CM | POA: Diagnosis not present

## 2023-10-03 DIAGNOSIS — R944 Abnormal results of kidney function studies: Secondary | ICD-10-CM | POA: Diagnosis not present

## 2023-10-03 DIAGNOSIS — I10 Essential (primary) hypertension: Secondary | ICD-10-CM | POA: Insufficient documentation

## 2023-10-03 DIAGNOSIS — Z7982 Long term (current) use of aspirin: Secondary | ICD-10-CM | POA: Diagnosis not present

## 2023-10-03 DIAGNOSIS — M25511 Pain in right shoulder: Secondary | ICD-10-CM | POA: Insufficient documentation

## 2023-10-03 DIAGNOSIS — Z853 Personal history of malignant neoplasm of breast: Secondary | ICD-10-CM | POA: Insufficient documentation

## 2023-10-03 DIAGNOSIS — Z79899 Other long term (current) drug therapy: Secondary | ICD-10-CM | POA: Insufficient documentation

## 2023-10-03 DIAGNOSIS — R7989 Other specified abnormal findings of blood chemistry: Secondary | ICD-10-CM

## 2023-10-03 LAB — CBC WITH DIFFERENTIAL/PLATELET
Abs Immature Granulocytes: 0.01 10*3/uL (ref 0.00–0.07)
Basophils Absolute: 0 10*3/uL (ref 0.0–0.1)
Basophils Relative: 1 %
Eosinophils Absolute: 0.1 10*3/uL (ref 0.0–0.5)
Eosinophils Relative: 2 %
HCT: 35.4 % — ABNORMAL LOW (ref 36.0–46.0)
Hemoglobin: 11.7 g/dL — ABNORMAL LOW (ref 12.0–15.0)
Immature Granulocytes: 0 %
Lymphocytes Relative: 17 %
Lymphs Abs: 0.9 10*3/uL (ref 0.7–4.0)
MCH: 29.8 pg (ref 26.0–34.0)
MCHC: 33.1 g/dL (ref 30.0–36.0)
MCV: 90.3 fL (ref 80.0–100.0)
Monocytes Absolute: 0.5 10*3/uL (ref 0.1–1.0)
Monocytes Relative: 9 %
Neutro Abs: 3.9 10*3/uL (ref 1.7–7.7)
Neutrophils Relative %: 71 %
Platelets: 156 10*3/uL (ref 150–400)
RBC: 3.92 MIL/uL (ref 3.87–5.11)
RDW: 12.2 % (ref 11.5–15.5)
WBC: 5.5 10*3/uL (ref 4.0–10.5)
nRBC: 0 % (ref 0.0–0.2)

## 2023-10-03 LAB — COMPREHENSIVE METABOLIC PANEL WITH GFR
ALT: 17 U/L (ref 0–44)
AST: 20 U/L (ref 15–41)
Albumin: 4.1 g/dL (ref 3.5–5.0)
Alkaline Phosphatase: 57 U/L (ref 38–126)
Anion gap: 10 (ref 5–15)
BUN: 31 mg/dL — ABNORMAL HIGH (ref 6–20)
CO2: 26 mmol/L (ref 22–32)
Calcium: 10.9 mg/dL — ABNORMAL HIGH (ref 8.9–10.3)
Chloride: 103 mmol/L (ref 98–111)
Creatinine, Ser: 2.27 mg/dL — ABNORMAL HIGH (ref 0.44–1.00)
GFR, Estimated: 25 mL/min — ABNORMAL LOW (ref >60)
Glucose, Bld: 141 mg/dL — ABNORMAL HIGH (ref 70–99)
Potassium: 5.1 mmol/L (ref 3.5–5.1)
Sodium: 139 mmol/L (ref 135–145)
Total Bilirubin: 0.4 mg/dL (ref 0.0–1.2)
Total Protein: 6.6 g/dL (ref 6.5–8.1)

## 2023-10-03 MED ORDER — LACTATED RINGERS IV BOLUS
1000.0000 mL | Freq: Once | INTRAVENOUS | Status: AC
  Administered 2023-10-03: 1000 mL via INTRAVENOUS

## 2023-10-03 MED ORDER — HYDROCODONE-ACETAMINOPHEN 5-325 MG PO TABS
1.0000 | ORAL_TABLET | ORAL | 0 refills | Status: AC | PRN
  Filled 2023-10-03: qty 12, 1d supply, fill #0

## 2023-10-03 MED ORDER — DIPHENHYDRAMINE HCL 25 MG PO CAPS
25.0000 mg | ORAL_CAPSULE | Freq: Once | ORAL | Status: AC
  Administered 2023-10-03: 25 mg via ORAL
  Filled 2023-10-03: qty 1

## 2023-10-03 MED ORDER — FENTANYL CITRATE PF 50 MCG/ML IJ SOSY
50.0000 ug | PREFILLED_SYRINGE | Freq: Once | INTRAMUSCULAR | Status: AC
  Administered 2023-10-03: 50 ug via INTRAVENOUS
  Filled 2023-10-03: qty 1

## 2023-10-03 MED ORDER — DEXAMETHASONE SODIUM PHOSPHATE 10 MG/ML IJ SOLN
10.0000 mg | Freq: Once | INTRAMUSCULAR | Status: AC
  Administered 2023-10-03: 10 mg via INTRAMUSCULAR
  Filled 2023-10-03: qty 1

## 2023-10-03 NOTE — Discharge Instructions (Addendum)
 You can use Benadryl as needed for itching.  Make sure that you keep a close check on your blood sugars and adjust your insulin as needed over the next few days.  Your kidney function was slightly worse than when it was checked in October.  Follow-up with your nephrologist closely regarding this.  You can follow-up with your primary care doctor or the orthopedic surgeon listed above regarding your shoulder.  Return to the emergency room if you have any worsening symptoms.

## 2023-10-03 NOTE — ED Provider Notes (Signed)
 [facility] [facility] AT [facility] Provider Note   CSN: [id number] Arrival date & time: 10/03/23  1208     History  Chief Complaint  Patient presents with   Shoulder Pain    Cynthia [name] is a 53 y.o. female.  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  History is obtained through video language interpreter.  She reports that she has had a 5-week history of pain in her right shoulder.  Is been getting worse and she is having trouble sleeping.  Mostly in the posterior aspect of the shoulder.  It is worse when she moves or when she lays on it.  She denies any known injuries to the shoulder.  She says it does radiate down her arm and up toward her neck at times.  She has some intermittent numbness in her fingers but nothing persistent.  No weakness in the arm.  No fevers.  She has had some nausea for the last 3 days but no vomiting.  No abdominal pain.  No cough or cold symptoms.  She feels like it is from the pain.  She also has itching all over.  She said she had this happen a few years ago and got some cream that she used all over her body and it went away.  She has not noted a rash.  She has been getting some physical therapy for her shoulder but it has not improved.  She has been seeing her PCP.  She has not seen an orthopedist.  She did take some old oxycodone that she had from a prior surgery but she said it expired in 2016.       Home Medications Prior to Admission medications   Medication Sig Start Date End Date Taking? Authorizing Provider  HYDROcodone-acetaminophen (NORCO/VICODIN) 5-325 MG tablet Take 1-2 tablets by mouth every 4 (four) hours as needed. 10/03/23  Yes Rolan Bucco, MD  Accu-Chek Softclix Lancets lancets Use as instructed Patient taking differently: 1 each by Other  route See admin instructions. Use as instructed 08/03/20   Romero Belling, MD  aspirin EC 81 MG tablet Take 81 mg by mouth daily. 08/01/21   [provider]  Blood Glucose Monitoring Suppl (ACCU-CHEK AVIVA CONNECT) w/Device KIT Check sugar 1x daily Patient taking differently: 1 each by Other route See admin instructions. Check sugar 1x daily 08/03/20   Romero Belling, MD  carvedilol (COREG) 25 MG tablet Take 25 mg by mouth 2 (two) times daily with a meal. 11/23/15   [provider]  cetirizine (ZYRTEC) 10 MG tablet Take 10 mg by mouth daily as needed for allergies.    [provider]  cholecalciferol (VITAMIN D3) 25 MCG (1000 UNIT) tablet Take 1,000 Units by mouth daily.    [provider]  cyanocobalamin 1000 MCG tablet Take 1,000 mcg by mouth daily.    [provider]  diclofenac Sodium (VOLTAREN) 1 % GEL Apply 4 g topically 4 (four) times daily. 05/16/23   Carlean Jews, NP  DROPLET PEN NEEDLES 31G X 8 MM MISC  03/30/22   [provider]  ENVARSUS XR 1 MG TB24 Take 4 mg by mouth daily. 08/22/22   [provider]  escitalopram (LEXAPRO) 10 MG tablet Take 10 mg by mouth daily. 03/08/21   [provider]  Fluoxetine HCl, PMDD, 20 MG TABS Take 20 mg  by mouth daily. 11/05/17   [provider]  furosemide (LASIX) 40 MG tablet Take 40 mg by mouth 2 (two) times daily as needed for fluid.    [provider]  gabapentin (NEURONTIN) 300 MG capsule Take 2 capsules (600 mg total) by mouth 2 (two) times daily. 05/16/23   Carlean Jews, NP  Garlic 100 MG TABS Take 100 mg by mouth daily.    [provider]  glucose blood (ACCU-CHEK AVIVA PLUS) test strip Check sugar 1x daily Patient taking differently: 1 each by Other route See admin instructions. Check sugar 1x daily 08/03/20   Romero Belling, MD  hydrALAZINE (APRESOLINE) 25 MG tablet Take 25 mg by mouth 2 (two) times daily. 05/11/22   [provider]  insulin  glargine (LANTUS SOLOSTAR) 100 UNIT/ML Solostar Pen Inject 25-30 Units into the skin See admin instructions. Inject 30 units into the skin in the morning and 25 units at bedtime    [provider]  Insulin Pen Needle (PEN NEEDLES) 30G X 8 MM MISC 1 each by Does not apply route daily. E11.9 09/01/19   Romero Belling, MD  magnesium oxide (MAG-OX) 400 MG tablet Take 400 mg by mouth 2 (two) times daily.    [provider]  multivitamin-lutein (OCUVITE-LUTEIN) CAPS capsule Take 1 capsule by mouth daily.    [provider]  mycophenolate (MYFORTIC) 180 MG EC tablet Take 360 mg by mouth 2 (two) times daily.    [provider]  oxyCODONE (OXY IR/ROXICODONE) 5 MG immediate release tablet Take 1 tablet (5 mg total) by mouth every 6 (six) hours as needed for severe pain. For AFTER surgery only, do not take and drive Patient taking differently: Take 5 mg by mouth 2 (two) times daily as needed for severe pain (pain score 7-10). 10/03/21   Cross, Efraim Kaufmann D, NP  pantoprazole (PROTONIX) 40 MG tablet Take 1 tablet (40 mg total) by mouth 2 (two) times daily. 07/27/22   Mansouraty, Netty Starring., MD  predniSONE (DELTASONE) 5 MG tablet Take 5 mg by mouth daily.  01/04/17   [provider]  rosuvastatin (CRESTOR) 5 MG tablet Take 5 mg by mouth at bedtime.    [provider]  senna-docusate (SENOKOT-S) 8.6-50 MG tablet Take 2 tablets by mouth at bedtime. For AFTER surgery, do not take if having diarrhea 07/06/22   Warner Mccreedy D, NP  tamoxifen (NOLVADEX) 20 MG tablet TAKE 1 TABLET(20 MG) BY MOUTH DAILY 03/18/23   Pollyann Samples, NP  tirzepatide [facility]) 12.5 MG/0.5ML Pen Inject 12.5 mg into the skin once a week. 02/01/22   [provider]      Allergies    Docetaxel, Norvasc [amlodipine], and Tape    Review of Systems   Review of Systems  Constitutional:  Negative for chills, diaphoresis, fatigue and fever.  HENT:  Negative for congestion, rhinorrhea and  sneezing.   Eyes: Negative.   Respiratory:  Negative for cough, chest tightness and shortness of breath.   Cardiovascular:  Negative for chest pain and leg swelling.  Gastrointestinal:  Positive for nausea. Negative for abdominal pain, blood in stool, diarrhea and vomiting.  Genitourinary:  Negative for difficulty urinating, flank pain, frequency and hematuria.  Musculoskeletal:  Positive for arthralgias. Negative for back pain.  Skin:  Negative for rash.  Neurological:  Negative for dizziness, speech difficulty, weakness, numbness and headaches.    Physical Exam Updated Vital Signs BP (!) 164/64   Pulse 83   Temp 98.4 F (  36.9 C) (Oral)   Resp 18   SpO2 97%  Physical Exam Constitutional:      Appearance: She is well-developed.  HENT:     Head: Normocephalic and atraumatic.  Eyes:     Pupils: Pupils are equal, round, and reactive to light.  Cardiovascular:     Rate and Rhythm: Normal rate and regular rhythm.     Heart sounds: Normal heart sounds.  Pulmonary:     Effort: Pulmonary effort is normal. No respiratory distress.     Breath sounds: Normal breath sounds. No wheezing or rales.  Chest:     Chest wall: No tenderness.  Abdominal:     General: Bowel sounds are normal.     Palpations: Abdomen is soft.     Tenderness: There is no abdominal tenderness. There is no guarding or rebound.  Musculoskeletal:        General: Normal range of motion.     Cervical back: Normal range of motion and neck supple.     Comments: Positive tenderness to the posterior aspect of the right shoulder.  There is no swelling or deformity noted.  No swelling of the arms.  Radial pulses intact.  She has normal sensation in all nerve distributions of the hand.  Normal motor function in the arm.  She has some pain with external rotation and abduction of the shoulder.  No pain along the spine.  Lymphadenopathy:     Cervical: No cervical adenopathy.  Skin:    General: Skin is warm and dry.      Findings: No rash.  Neurological:     Mental Status: She is alert and oriented to person, place, and time.     ED Results / Procedures / Treatments   Labs (all labs ordered are listed, but only abnormal results are displayed) Labs Reviewed  CBC WITH DIFFERENTIAL/PLATELET - Abnormal; Notable for the following components:      Result Value   Hemoglobin 11.7 (*)    HCT 35.4 (*)    All other components within normal limits  COMPREHENSIVE METABOLIC PANEL WITH GFR - Abnormal; Notable for the following components:   Glucose, Bld 141 (*)    BUN 31 (*)    Creatinine, Ser 2.27 (*)    Calcium 10.9 (*)    GFR, Estimated 25 (*)    All other components within normal limits    EKG None  Radiology DG Shoulder Right Result Date: 10/03/2023 CLINICAL DATA:  Right shoulder pain for 3 days.  No known injury. EXAM: RIGHT SHOULDER - 2+ VIEW COMPARISON:  None available FINDINGS: Irregularity of the greater and lesser tuberosities indicative of rotator cuff tendinopathy. Mild degenerative changes of the acromioclavicular joint. No fracture or dislocation. IMPRESSION: 1. No acute abnormality of the right shoulder. 2. Degenerative changes of the [facility] joint. 3. Rotator cuff tendinopathy. Electronically Signed   By: Acquanetta Belling M.D.   On: 10/03/2023 15:09    Procedures Procedures    Medications Ordered in ED Medications  diphenhydrAMINE (BENADRYL) capsule 25 mg (25 mg Oral Given 10/03/23 1302)  lactated ringers bolus 1,000 mL (1,000 mLs Intravenous New Bag/Given 10/03/23 1431)  dexamethasone (DECADRON) injection 10 mg (10 mg Intramuscular Given 10/03/23 1437)  fentaNYL (SUBLIMAZE) injection 50 mcg (50 mcg Intravenous Given 10/03/23 1432)    ED Course/ Medical Decision Making/ A&P  Medical Decision Making Amount and/or Complexity of Data Reviewed Labs: ordered. Radiology: ordered.  Risk Prescription drug management.   Patient presents with right shoulder pain.  It  seems to be musculoskeletal in nature.  It is worse with movement and palpation of the right shoulder.  She does not have any warmth, erythema or fevers that would be more concerning for infection.  X-rays were performed.  These were interpreted by me to show no acute abnormality.  No bony destruction.  No mass.  It is well located in the joint.  Awaiting radiology overread.  Labs show elevation in her creatinine.  Based on prior labs, it is mildly increased from her prior labs 2.27 today, 1.89 10/24, up to 2.13 in 2023.  Will need to follow-up with her nephrologist.  Discussed this with her.  Overall does not appear systemically ill.  She has had a little bit of nausea which she attributes to the pain.  She has also had some itching although I do not see a rash.  She has had this in the past.  No elevation in her LFTs.  She was given a dose of Decadron which potentially will help with her shoulder pain as well as the itching.  She was given a dose of Benadryl for the itching.  Will give her a prescription for short course of pain medication.  Encouraged her to follow back up with her primary care doctor and to have close follow-up with her nephrologist.  Will also give her referral information to follow-up with an orthopedist if her symptoms are ongoing.  Will plan to be discharged following official radiology over read of the x-ray.  Final Clinical Impression(s) / ED Diagnoses Final diagnoses:  Acute pain of right shoulder  Elevated serum creatinine    Rx / DC Orders ED Discharge Orders          Ordered    HYDROcodone-acetaminophen (NORCO/VICODIN) 5-325 MG tablet  Every 4 hours PRN        10/03/23 1520              Rolan Bucco, MD 10/03/23 [phone]

## 2023-10-03 NOTE — ED Triage Notes (Signed)
 Right shoulder pain x 4 days Unable to sleep due to pain Taking classes to help with moving hand  Body itching  Nausea x 3 days  Rx pain meds for pain last dose 9 AM

## 2023-10-03 NOTE — ED Notes (Signed)
 Discharge instructions, follow up care, pain management, and prescription reviewed and explained, pt verbalized understanding and had no further questions on d/c.

## 2023-10-03 NOTE — ED Notes (Signed)
 Patient transported to X-ray

## 2023-10-04 ENCOUNTER — Telehealth: Payer: Self-pay | Admitting: Rehabilitative and Restorative Service Providers"

## 2023-10-04 ENCOUNTER — Ambulatory Visit: Attending: Registered Nurse | Admitting: Rehabilitative and Restorative Service Providers"

## 2023-10-04 DIAGNOSIS — M62838 Other muscle spasm: Secondary | ICD-10-CM | POA: Insufficient documentation

## 2023-10-04 DIAGNOSIS — M6281 Muscle weakness (generalized): Secondary | ICD-10-CM | POA: Insufficient documentation

## 2023-10-04 DIAGNOSIS — M5416 Radiculopathy, lumbar region: Secondary | ICD-10-CM | POA: Insufficient documentation

## 2023-10-04 DIAGNOSIS — M5459 Other low back pain: Secondary | ICD-10-CM | POA: Insufficient documentation

## 2023-10-04 DIAGNOSIS — R279 Unspecified lack of coordination: Secondary | ICD-10-CM | POA: Insufficient documentation

## 2023-10-04 DIAGNOSIS — R2689 Other abnormalities of gait and mobility: Secondary | ICD-10-CM | POA: Insufficient documentation

## 2023-10-04 DIAGNOSIS — R252 Cramp and spasm: Secondary | ICD-10-CM | POA: Insufficient documentation

## 2023-10-04 DIAGNOSIS — M62830 Muscle spasm of back: Secondary | ICD-10-CM | POA: Insufficient documentation

## 2023-10-04 DIAGNOSIS — R293 Abnormal posture: Secondary | ICD-10-CM | POA: Insufficient documentation

## 2023-10-04 NOTE — Telephone Encounter (Signed)
 Patient called via in person interpreter and notified of missed visit on 10/04/23.  Reminded patient of next scheduled visit via voicemail using in person interpreter.

## 2023-10-10 ENCOUNTER — Ambulatory Visit

## 2023-10-17 ENCOUNTER — Ambulatory Visit

## 2023-10-22 ENCOUNTER — Other Ambulatory Visit (INDEPENDENT_AMBULATORY_CARE_PROVIDER_SITE_OTHER)

## 2023-10-22 ENCOUNTER — Encounter: Payer: Self-pay | Admitting: Gastroenterology

## 2023-10-22 ENCOUNTER — Ambulatory Visit: Payer: Medicare HMO | Admitting: Gastroenterology

## 2023-10-22 VITALS — BP 114/56 | HR 78 | Ht 61.0 in | Wt 154.0 lb

## 2023-10-22 DIAGNOSIS — R110 Nausea: Secondary | ICD-10-CM

## 2023-10-22 DIAGNOSIS — N289 Disorder of kidney and ureter, unspecified: Secondary | ICD-10-CM

## 2023-10-22 DIAGNOSIS — R140 Abdominal distension (gaseous): Secondary | ICD-10-CM

## 2023-10-22 DIAGNOSIS — K219 Gastro-esophageal reflux disease without esophagitis: Secondary | ICD-10-CM | POA: Diagnosis not present

## 2023-10-22 DIAGNOSIS — R1013 Epigastric pain: Secondary | ICD-10-CM | POA: Diagnosis not present

## 2023-10-22 DIAGNOSIS — R109 Unspecified abdominal pain: Secondary | ICD-10-CM

## 2023-10-22 DIAGNOSIS — R634 Abnormal weight loss: Secondary | ICD-10-CM

## 2023-10-22 DIAGNOSIS — R6881 Early satiety: Secondary | ICD-10-CM

## 2023-10-22 DIAGNOSIS — R1310 Dysphagia, unspecified: Secondary | ICD-10-CM

## 2023-10-22 DIAGNOSIS — R1010 Upper abdominal pain, unspecified: Secondary | ICD-10-CM

## 2023-10-22 LAB — COMPREHENSIVE METABOLIC PANEL WITH GFR
ALT: 27 U/L (ref 0–35)
AST: 24 U/L (ref 0–37)
Albumin: 4.2 g/dL (ref 3.5–5.2)
Alkaline Phosphatase: 68 U/L (ref 39–117)
BUN: 57 mg/dL — ABNORMAL HIGH (ref 6–23)
CO2: 30 meq/L (ref 19–32)
Calcium: 10.3 mg/dL (ref 8.4–10.5)
Chloride: 100 meq/L (ref 96–112)
Creatinine, Ser: 4.37 mg/dL — ABNORMAL HIGH (ref 0.40–1.20)
GFR: 10.84 mL/min — CL (ref >60.00)
Glucose, Bld: 170 mg/dL — ABNORMAL HIGH (ref 70–99)
Potassium: 4.3 meq/L (ref 3.5–5.1)
Sodium: 138 meq/L (ref 135–145)
Total Bilirubin: 0.6 mg/dL (ref 0.2–1.2)
Total Protein: 6.1 g/dL (ref 6.0–8.3)

## 2023-10-22 LAB — CBC
HCT: 35.9 % — ABNORMAL LOW (ref 36.0–46.0)
Hemoglobin: 12.3 g/dL (ref 12.0–15.0)
MCHC: 34.4 g/dL (ref 30.0–36.0)
MCV: 88.3 fl (ref 78.0–100.0)
Platelets: 162 10*3/uL (ref 150.0–400.0)
RBC: 4.06 Mil/uL (ref 3.87–5.11)
RDW: 12.7 % (ref 11.5–15.5)
WBC: 4.8 10*3/uL (ref 4.0–10.5)

## 2023-10-22 LAB — IBC + FERRITIN
Ferritin: 1122.8 ng/mL — ABNORMAL HIGH (ref 10.0–291.0)
Iron: 96 ug/dL (ref 42–145)
Saturation Ratios: 37.7 % (ref 20.0–50.0)
TIBC: 254.8 ug/dL (ref 250.0–450.0)
Transferrin: 182 mg/dL — ABNORMAL LOW (ref 212.0–360.0)

## 2023-10-22 LAB — RETICULOCYTES
ABS Retic: 79600 {cells}/uL (ref 20000–80000)
Retic Ct Pct: 2 %

## 2023-10-22 LAB — LIPASE: Lipase: 32 U/L (ref 11.0–59.0)

## 2023-10-22 LAB — VITAMIN B12: Vitamin B-12: 1142 pg/mL — ABNORMAL HIGH (ref 211–911)

## 2023-10-22 LAB — AMYLASE: Amylase: 39 U/L (ref 27–131)

## 2023-10-22 LAB — CORTISOL: Cortisol, Plasma: 5.5 ug/dL

## 2023-10-22 LAB — FOLATE: Folate: >25.2 ng/mL (ref >5.9)

## 2023-10-22 MED ORDER — PROMETHAZINE HCL 12.5 MG PO TABS: 12.5000 mg | ORAL_TABLET | Freq: Three times a day (TID) | ORAL | 0 refills | Status: AC | PRN

## 2023-10-22 NOTE — Patient Instructions (Addendum)
 We have sent the following medications to your pharmacy for you to pick up at your convenience: Promethazine    Your provider has requested that you go to the basement level for lab work before leaving today. Press "B" on the elevator. The lab is located at the first door on the left as you exit the elevator.  You have been scheduled for an endoscopy. Please follow written instructions given to you at your visit today.  If you use inhalers (even only as needed), please bring them with you on the day of your procedure.  If you take any of the following medications, they will need to be adjusted prior to your procedure:   DO NOT TAKE 7 DAYS PRIOR TO TEST- Trulicity (dulaglutide) Ozempic, Wegovy (semaglutide) Mounjaro (tirzepatide) Bydureon Bcise (exanatide extended release)  DO NOT TAKE 1 DAY PRIOR TO YOUR TEST Rybelsus (semaglutide) Adlyxin (lixisenatide) Victoza (liraglutide) Byetta (exanatide) ________________________________________________________  You have been scheduled for a CT scan of the abdomen and pelvis at [facility], 1st floor Radiology. You are scheduled on 10/24/23 at 5:30 pm . You should arrive at 3:15 pm  2 hours prior to your appointment time for registration and to drink oral contrast.   Please follow the written instructions below on the day of your exam:   1) Do not eat anything after 1:30 pm  (4 hours prior to your test)     You may take any medications as prescribed with a small amount of water , if necessary. If you take any of the following medications: METFORMIN, GLUCOPHAGE, GLUCOVANCE, AVANDAMET, RIOMET, FORTAMET, ACTOPLUS MET, JANUMET, GLUMETZA or METAGLIP, you MAY be asked to HOLD this medication 48 hours AFTER the exam.   The purpose of you drinking the oral contrast is to aid in the visualization of your intestinal tract. The contrast solution may cause some diarrhea. Depending on your individual set of symptoms, you may also receive an intravenous  injection of x-ray contrast/dye. Plan on being at [facility] for 45 minutes or longer, depending on the type of exam you are having performed.   If you have any questions regarding your exam or if you need to reschedule, you may call [facility] at [phone] between the hours of 8:00 am and 5:00 pm, Monday-Friday.   Due to recent changes in healthcare laws, you may see the results of your imaging and laboratory studies on MyChart before your provider has had a chance to review them.  We understand that in some cases there may be results that are confusing or concerning to you. Not all laboratory results come back in the same time frame and the provider may be waiting for multiple results in order to interpret others.  Please give us  48 hours in order for your provider to thoroughly review all the results before contacting the office for clarification of your results.   Thank you for choosing me and [facility].  Dr. Brice Campi

## 2023-10-22 NOTE — Progress Notes (Signed)
 Cynthia [name] [id number] 01-21-1971   Chief Complaint: Trouble swallowing liquids and solids, epigastric abdominal pain, gas/bloating, nausea, early satiety, weakness/dizziness, weight loss  Referring Provider: Hershell Lose, NP Primary GI MD: Dr. Brice Campi  Interval history: Cynthia [name] is a 53 y.o. female with past medical history of breast cancer (status post lumpectomy and hysterectomy and BSO), renal transplant, OSA, HTN, DM, HLD, GERD, Diverticulosis, colon polyps (TA's), who presents today for a complaint of bloating and difficulty swallowing.    She was last seen in the office 07/27/2022, and at that time was doing well from a GERD standpoint but continued to have intermittent symptoms of bloating and abdominal distention. The plan at that time was to continue PPI BID and have patient evaluated by ENT, after which her PPI could hopefully be reduced to lowest effective dose (particularly given history of renal transplant). She was advised to take Gas-X or Phazyme extra strength 3 times daily for 2 weeks to see if that would improve her bloating.  The possibility of SIBO was also discussed, and patient was to have breath testing a few weeks after her appointment due to recent use of antibiotics.   HPI: Patient presents to the clinic today with several concerns, most of which are new in the last month or so. She reports difficulty swallowing both solids and liquids. She first noticed pain with swallowing liquids about 1 month ago, which felt like liquids were going down very slowly and causing discomfort in her throat and chest. In the last 3 weeks she has had occasional trouble with solids as well, though this does not occur every day. She has not identified any particular food triggers. It feels like food gets stuck in her throat but it does eventually go down. She is not regurgitating food.  She has nausea daily for the last few weeks which begins when she sees food before a meal.  She has to force herself to eat and has not been able to vomit to relieve nausea.  She reports recent weight loss which she first noticed about 2 weeks ago. From our records she is down 15 pounds since her visit last year. She tells us  at her last visit with primary care 1-2 months ago she was 178 lbs, and today is 154 lbs.  She has had burning epigastric abdominal pain which comes and goes but does occur daily. Pain begins in epigastrium and can radiate around both sides to the middle of her back. Eating sometimes helps the pain but sometimes makes it worse. The pain feels similar to the pain she had prior to her cholecystectomy. Pain has been present for the last week and at worst is a 6/10 on the pain scale.  Additionally, she has been feeling weak and dizzy for 2 weeks. Dizziness occurs when she stands up/walks. She denies any syncopal episodes.   She is having a small bowel movement daily. This is a change from her usual 2-3 bowel movements per week. She does have to strain but recently stool has been softer and she had an episode of diarrhea last night. She denies seeing any blood in her stool or melena.   She denies acid reflux or heartburn on PPI BID. She is not experiencing as much gas/bloating since she is not eating much. Gas-X did seem to help with this when she took it.  She reports that her diabetes is stable.  Previous GI Procedures   EGD 05/2022 Impression:                -  No gross lesions in the entire esophagus.                            Biopsied. Dilated with 54 [place]. Z-line                            regular, 35 cm from the incisors.                           - 2 cm hiatal hernia.                           - Multiple gastric polyps. Biopsied.                           - Gastritis. Biopsied.                           - No gross lesions in the duodenal bulb, in the                            first portion of the duodenum and in the second                             portion of the duodenum.   Pathology Diagnosis 1. Surgical [P], gastric ANTRAL AND OXYNTIC MUCOSA WITH FOCAL HYPEREMIA NO HELICOBACTER PYLORI IDENTIFIED. 2. Surgical [P], gastric polyps FUNDIC GLAND POLYPS. 3. Surgical [P], random esophageal UNREMARKABLE SQUAMOUS MUCOSA. NEGATIVE FOR EOSINOPHILIC ESOPHAGITIS.   Colonoscopy 11/10/2019 - Hemorrhoids found on digital rectal exam.  - The examined portion of the ileum was normal.  - Two 2 to 3 mm polyps in the transverse colon, removed with a cold snare. Resected and retrieved.  - Diverticulosis in the recto- sigmoid colon and in the sigmoid colon.  - Normal mucosa in the entire examined colon.  - Non- bleeding non- thrombosed external and internal hemorrhoids.  Diagnosis Surgical [P], colon, transverse, polyp (2) - TUBULAR ADENOMA WITHOUT HIGH-GRADE DYSPLASIA OR MALIGNANCY - OTHER FRAGMENT OF POLYPOID COLONIC MUCOSA WITH NO SPECIFIC HISTOPATHOLOGIC CHANGES  Past Medical History:  Diagnosis Date   Allergy    pollen   Anemia    Anxiety    BRCA2 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 03/01/2021   Breast cancer [facility])    Cataract    surgical repair bilateral   Complication of anesthesia    not herself for 2 days after last surgery (brest lumpectomy)   Coronary artery disease    Depression    ESRD on hemodialysis (HCC)    Home HD 5x per week- not on dialysis now had tramsplant 4/17   Family history of ovarian cancer 02/22/2021   GERD (gastroesophageal reflux disease)    Hearing loss 2017   right ear   History of blood transfusion    transfusion reaction   Hyperlipidemia    Hypertension    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 per pt-dr levy also II; Per patient 08/18/21 she is Type 2   Kidney transplant recipient    Leukocytosis 06/13/2021   Neuromuscular disorder (HCC)    NEUROPATHY   Sleep apnea  Past Surgical History:  Procedure Laterality Date   ABDOMINAL  HYSTERECTOMY     BREAST EXCISIONAL BIOPSY Right 08/2015   BREAST LUMPECTOMY WITH RADIOACTIVE SEED LOCALIZATION Right 09/14/2015   Procedure: RIGHT BREAST LUMPECTOMY WITH RADIOACTIVE SEED LOCALIZATION;  Surgeon: Dareen Ebbing, MD;  Location: [facility];  Service: General;  Laterality: Right;   BREAST LUMPECTOMY WITH RADIOACTIVE SEED LOCALIZATION Left 03/02/2021   Procedure: LEFT BREAST SEED LUMPECTOMY LEFT SENTINEL LYMPH NODE MAPPING;  Surgeon: Sim Dryer, MD;  Location: [facility];  Service: General;  Laterality: Left;  GEN AND PEC BLOCK   BREAST SURGERY Bilateral    biopsy bilateral   CARDIAC CATHETERIZATION     CATARACT EXTRACTION Bilateral    bilateral   CHOLECYSTECTOMY     CYST REMOVAL NECK     DIALYSIS FISTULA CREATION Left    EYE SURGERY Bilateral    lazer   kidney transplant     LEFT HEART CATH AND CORONARY ANGIOGRAPHY N/A 03/04/2019   Procedure: LEFT HEART CATH AND CORONARY ANGIOGRAPHY;  Surgeon: Swaziland, Peter M, MD;  Location: [facility] INVASIVE CV LAB;  Service: Cardiovascular;  Laterality: N/A;   LEFT HEART CATHETERIZATION WITH CORONARY ANGIOGRAM N/A 03/30/2014   Procedure: LEFT HEART CATHETERIZATION WITH CORONARY ANGIOGRAM;  Surgeon: Mickiel Albany, MD;  Location: [facility] CATH LAB;  Service: Cardiovascular;  Laterality: N/A;   LIPOMA EXCISION N/A 02/06/2017   Procedure: EXCISION POSTERIOR NECK SEBACEOUS CYST;  Surgeon: Oza Blumenthal, MD;  Location: [facility] OR;  Service: General;  Laterality: N/A;   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   Procedure: REPAIR OF ARTERIOVENOUS FISTULA ANEURYSM;  Surgeon: Margherita Shell, MD;  Location: [facility] OR;  Service: Vascular;  Laterality: Left;   REVISON OF ARTERIOVENOUS FISTULA Left 09/28/2013   Procedure: EXCISE ESCHAR LEFT ARM  ARTERIOVENOUS FISTULA WITH PLICATION OF LEFT ARM ARTERIOVENOUS FISTULA;  Surgeon: Richrd Char, MD;  Location: [facility] OR;  Service: Vascular;  Laterality: Left;   REVISON  OF ARTERIOVENOUS FISTULA Left 08/26/2015   Procedure: RESECTION ANEURYSM OF LEFT ARM ARTERIOVENOUS FISTULA  ;  Surgeon: Margherita Shell, MD;  Location: [facility] OR;  Service: Vascular;  Laterality: Left;   ROBOTIC ASSISTED BILATERAL SALPINGO OOPHERECTOMY N/A 07/11/2022   Procedure: XI ROBOTIC ASSISTED BILATERAL SALPINGO OOPHORECTOMY;  Surgeon: Suzi Essex, MD;  Location: [facility];  Service: Gynecology;  Laterality: N/A;   SIMPLE MASTECTOMY WITH AXILLARY SENTINEL NODE BIOPSY Bilateral 08/22/2021   Procedure: BILATERAL SIMPLE MASTECTOMY;  Surgeon: Sim Dryer, MD;  Location: [facility] OR;  Service: General;  Laterality: Bilateral;   TUBAL LIGATION      Current Outpatient Medications  Medication Sig Dispense Refill   Accu-Chek Softclix Lancets lancets Use as instructed (Patient taking differently: 1 each by Other route See admin instructions. Use as instructed) 100 each 12   aspirin  EC 81 MG tablet Take 81 mg by mouth daily.     Blood Glucose Monitoring Suppl (ACCU-CHEK AVIVA CONNECT) w/Device KIT Check sugar 1x daily (Patient taking differently: 1 each by Other route See admin instructions. Check sugar 1x daily) 1 kit 0   carvedilol  (COREG ) 25 MG tablet Take 25 mg by mouth 2 (two) times daily with a meal.     cholecalciferol  (VITAMIN D3) 25 MCG (1000 UNIT) tablet Take 1,000 Units by mouth daily.     cyanocobalamin  1000 MCG tablet Take 1,000 mcg by mouth daily.     DROPLET PEN NEEDLES 31G X 8 MM MISC  ENVARSUS  XR 1 MG TB24 Take 4 mg by mouth daily.     escitalopram  (LEXAPRO ) 10 MG tablet Take 10 mg by mouth daily.     furosemide  (LASIX ) 40 MG tablet Take 40 mg by mouth 2 (two) times daily as needed for fluid.     gabapentin  (NEURONTIN ) 300 MG capsule Take 2 capsules (600 mg total) by mouth 2 (two) times daily. 120 capsule 1   Garlic 100 MG TABS Take 100 mg by mouth daily.     glucose blood (ACCU-CHEK AVIVA PLUS) test strip Check sugar 1x daily (Patient taking differently: 1 each by Other route See  admin instructions. Check sugar 1x daily) 100 each 12   insulin  glargine (LANTUS  SOLOSTAR) 100 UNIT/ML Solostar Pen Inject 25-30 Units into the skin See admin instructions. Inject 30 units into the skin in the morning and 25 units at bedtime     Insulin  Pen Needle (PEN NEEDLES) 30G X 8 MM MISC 1 each by Does not apply route daily. E11.9 90 each 0   magnesium  oxide (MAG-OX) 400 MG tablet Take 400 mg by mouth 2 (two) times daily.     multivitamin-lutein (OCUVITE-LUTEIN) CAPS capsule Take 1 capsule by mouth daily.     mycophenolate  (MYFORTIC ) 180 MG EC tablet Take 360 mg by mouth 2 (two) times daily.     ondansetron  (ZOFRAN ) 8 MG tablet Take 8 mg by mouth 2 (two) times daily.     pantoprazole  (PROTONIX ) 40 MG tablet Take 1 tablet (40 mg total) by mouth 2 (two) times daily. 60 tablet 11   predniSONE  (DELTASONE ) 5 MG tablet Take 5 mg by mouth daily.   2   promethazine  (PHENERGAN ) 12.5 MG tablet Take 1 tablet (12.5 mg total) by mouth every 8 (eight) hours as needed for nausea. 60 tablet 0   rosuvastatin  (CRESTOR ) 5 MG tablet Take 5 mg by mouth at bedtime.     senna-docusate (SENOKOT-S) 8.6-50 MG tablet Take 2 tablets by mouth at bedtime. For AFTER surgery, do not take if having diarrhea 30 tablet 0   tamoxifen  (NOLVADEX ) 20 MG tablet TAKE 1 TABLET(20 MG) BY MOUTH DAILY 30 tablet 3   tirzepatide (MOUNJARO) 12.5 MG/0.5ML Pen Inject 12.5 mg into the skin once a week.     Vibegron (GEMTESA) 75 MG TABS Take 75 mg by mouth daily.     hydrALAZINE  (APRESOLINE ) 25 MG tablet Take 25 mg by mouth 2 (two) times daily.     HYDROcodone -acetaminophen  (NORCO/VICODIN) 5-325 MG tablet Take 1-2 tablets by mouth every 4 (four) hours as needed. 12 tablet 0   No current facility-administered medications for this visit.    Allergies as of 10/22/2023 - Review Complete 10/22/2023  Allergen Reaction Noted   Docetaxel  Nausea Only 05/24/2021   Norvasc  [amlodipine ] Swelling 02/28/2021   Tape Other (See Comments) 06/20/2015     Family History  Problem Relation Age of Onset   Diabetes Mother    Kidney disease Mother    Diabetes Father    Heart disease Father    Kidney disease Father    Hypertension Father    Diabetes Sister    Asthma Sister    Lupus Sister    Diabetes Brother    Diabetes Brother    Diabetes Brother    Diabetes Brother    Cancer Maternal Grandmother 50       ovarian cancer   Ovarian cancer Maternal Grandmother    Diabetes Maternal Grandfather    Diabetes Paternal Grandmother  Colon cancer Neg Hx    Colon polyps Neg Hx    Esophageal cancer Neg Hx    Rectal cancer Neg Hx    Stomach cancer Neg Hx    Breast cancer Neg Hx    Endometrial cancer Neg Hx    Pancreatic cancer Neg Hx    Prostate cancer Neg Hx    Inflammatory bowel disease Neg Hx    Liver disease Neg Hx     Social History   Tobacco Use   Smoking status: Never   Smokeless tobacco: Never  Vaping Use   Vaping status: Never Used  Substance Use Topics   Alcohol use: Not Currently    Comment: Socially   Drug use: No     Review of Systems:    Constitutional: Unintentional wt loss, weakness, dizziness HEENT: Eyes: No change in vision Ears, Nose, Throat:  No change in hearing or congestion Skin: No rash or itching Cardiovascular: No chest pain, chest pressure or palpitations   Respiratory: No SOB or cough Gastrointestinal: See HPI and otherwise negative Genitourinary: No dysuria or change in urinary frequency Neurological: Dizziness on standing, no syncope Musculoskeletal: No new muscle or joint pain Hematologic: No bleeding or bruising     Physical Exam:  Vital signs: BP (!) 114/56   Pulse 78   Ht 5\' 1"  (1.549 m)   Wt 154 lb (69.9 kg)   SpO2 99%   BMI 29.10 kg/m    Constitutional: NAD but does appear to be uncomfortable and in pain.  Alert and cooperative Head:  Normocephalic and atraumatic. Eyes:  EOMs intact. No scleral icterus. Conjunctiva pink. Respiratory: Respirations even and unlabored.  Lungs clear to auscultation bilaterally.  No wheezes, crackles, or rhonchi.  Cardiovascular:  Regular rate and rhythm. No peripheral edema, cyanosis or pallor.  Gastrointestinal:  Soft, nondistended, tender to palpation of epigastrium and RUQ, some tenderness in lower abdomen as well. No rebound or guarding. Normal bowel sounds. No appreciable masses or hepatomegaly. Rectal:  Not performed.  Msk:  Symmetrical without gross deformities. Without edema, no deformity or joint abnormality.  Neurologic:  Alert and oriented x4;  grossly normal neurologically.  Skin:   Dry and intact without significant lesions or rashes. Psychiatric: Oriented to person, place and time. Demonstrates good judgement and reason without abnormal affect or behaviors.   RELEVANT LABS AND IMAGING: CBC    Component Value Date/Time   WBC 4.8 10/22/2023 1025   RBC 4.06 10/22/2023 1025   HGB 12.3 10/22/2023 1025   HGB 12.6 04/17/2023 1044   HGB 13.5 02/27/2019 1152   HCT 35.9 (L) 10/22/2023 1025   HCT 39.5 02/27/2019 1152   PLT 162.0 10/22/2023 1025   PLT 154 04/17/2023 1044   PLT 186 02/27/2019 1152   MCV 88.3 10/22/2023 1025   MCV 94 02/27/2019 1152   MCH 29.8 10/03/2023 1312   MCHC 34.4 10/22/2023 1025   RDW 12.7 10/22/2023 1025   RDW 12.2 02/27/2019 1152   LYMPHSABS 0.9 10/03/2023 1312   LYMPHSABS 1.7 02/27/2019 1152   MONOABS 0.5 10/03/2023 1312   EOSABS 0.1 10/03/2023 1312   EOSABS 0.1 02/27/2019 1152   BASOSABS 0.0 10/03/2023 1312   BASOSABS 0.0 02/27/2019 1152    CMP     Component Value Date/Time   NA 138 10/22/2023 1025   NA 136 02/27/2019 1152   K 4.3 10/22/2023 1025   CL 100 10/22/2023 1025   CO2 30 10/22/2023 1025   GLUCOSE 170 (H) 10/22/2023 1025  BUN 57 (H) 10/22/2023 1025   BUN 19 02/27/2019 1152   CREATININE 4.37 (H) 10/22/2023 1025   CREATININE 1.89 (H) 04/17/2023 1044   CALCIUM  10.3 10/22/2023 1025   PROT 6.1 10/22/2023 1025   ALBUMIN  4.2 10/22/2023 1025   AST 24 10/22/2023 1025    AST 18 04/17/2023 1044   ALT 27 10/22/2023 1025   ALT 25 04/17/2023 1044   ALKPHOS 68 10/22/2023 1025   BILITOT 0.6 10/22/2023 1025   BILITOT 0.4 04/17/2023 1044   GFRNONAA 25 (L) 10/03/2023 1312   GFRNONAA 31 (L) 04/17/2023 1044   GFRAA >60 06/06/2019 0250     Assessment/Plan:   Epigastric pain Early satiety Nausea Weight loss Patient experiencing symptoms of burning epigastric pain with radiation to her back, and is tender on exam today to palpation of epigastrium and RUQ. She has been having nausea without vomiting and unintentional weight loss as well, with onset of these symptoms between 1-4 weeks ago.  She has not had any recent abdominal imaging.  - We will order CT abdomen/pelvis with oral contrast only due to patient's history of renal transplant and recently worsening renal function.  - We will send her for labs today to include the following: CBC, CMP, amylase, lipase, iron panel, ferritin, B12, folate, reticulocyte count. - She has been taking Zofran  for her nausea without much improvement.  We will send low-dose promethazine  to [facility] in [place].   Dysphagia Patient has previously had improvement in her swallowing with past esophageal dilation. She now has recurrence of dysphagia over the last month. We will plan for EGD with possible dilation to further evaluate her symptoms, as well as to rule out PUD.  Gas/bloating Does not appear she had SIBO testing. She is not having as much trouble with gas and bloating since she has been eating less and having epigastric pain and nausea. Can consider revisiting this and testing for SIBO at follow-up.  GERD Continue Protonix  40mg  BID for GERD.    Valiant Gaul, PA-C [facility] 10/23/2023, 6:34 AM  Patient Care Team: Hershell Lose, NP as PCP - General (Nurse Practitioner) Sheryle Donning, MD as PCP - Cardiology (Cardiology) Charletta Cons, MD as Consulting Physician (Nephrology) Patrick Boor, MD as  Referring Physician (Nephrology) Sonja [place], MD as Consulting Physician (Hematology) Sim Dryer, MD as Consulting Physician (General Surgery) Causey, Laura Polio, NP as Nurse Practitioner (Hematology and Oncology) Dove, Natro D, RN as Registered Nurse      Attending Physician's Attestation   I have taken an interval history, reviewed the chart and examined the patient in depth with the patient's family as well.  Interpreter services were used.   Overall, patient did have improvement in her dysphagia symptoms after last dilation so a repeat upper endoscopy does make sense.  With her red flag symptoms of unintentional weight loss and persisting abdominal pain and discomfort, upper endoscopy does make sense as well.  I do think we need to get cross-sectional imaging to rule out other occult issues.  Interestingly, she has had progressive acute on chronic renal insufficiency develop.  Hopefully her kidney function is not worse.  This is a reason why we will repeat her laboratories later today.  If she is having progressive uremia, this could be a reason for nausea and weight loss and progressive abdominal discomfort.  Time will tell.  She does have a follow-up with nephrology in the coming week.  Will plan for diagnostic endoscopy and cross-sectional CT imaging (noncontrasted due to  underlying renal insufficiency).  I agree with the Advanced Practitioner's note, impression, and recommendations with updates and my documentation as noted above.  The majority of the medical decision making/process, formulation of the impression/plan of action for the patient were performed by me with substantive portion of this encounter (>50% time spent including complete performance of at least one of the key components of MDM, History, and/or Exam).   Yong Henle, MD [facility] Advanced Endoscopy Office # [phone]   PM addendum Extensive discussion with [facility] with her  creatinine have been returned in the 4 range.  This has doubled over the course of 2 weeks.  I was able to speak with nephrologists team so that they are aware and my team will fax over the results of the laboratories.  I called and spoke with the patient about the results and she is aware that she may end up having an earlier follow-up with nephrology or may need to go to the emergency room, but I will let [facility] let the patient know what they want to do.  She agrees with this plan of action and is appreciative for the call back this afternoon.  As a result of the extensive discussions needed and calls after clinic, this is a level 5 clinic visit as a result of over greater than 60 minutes of time being spent with the patient in the room and with coordination of care and evaluation of laboratories.

## 2023-10-23 ENCOUNTER — Inpatient Hospital Stay (HOSPITAL_COMMUNITY)

## 2023-10-23 ENCOUNTER — Encounter: Payer: Self-pay | Admitting: Gastroenterology

## 2023-10-23 ENCOUNTER — Emergency Department (HOSPITAL_COMMUNITY)

## 2023-10-23 ENCOUNTER — Other Ambulatory Visit: Payer: Self-pay

## 2023-10-23 ENCOUNTER — Inpatient Hospital Stay (HOSPITAL_COMMUNITY)
Admission: EM | Admit: 2023-10-23 | Discharge: 2023-10-25 | DRG: 699 | Disposition: A | Discharge status: Home / Self Care | Attending: Internal Medicine | Admitting: Internal Medicine

## 2023-10-23 ENCOUNTER — Encounter (HOSPITAL_COMMUNITY): Payer: Self-pay | Admitting: Emergency Medicine

## 2023-10-23 DIAGNOSIS — I129 Hypertensive chronic kidney disease with stage 1 through stage 4 chronic kidney disease, or unspecified chronic kidney disease: Secondary | ICD-10-CM | POA: Diagnosis present

## 2023-10-23 DIAGNOSIS — E1165 Type 2 diabetes mellitus with hyperglycemia: Secondary | ICD-10-CM | POA: Diagnosis present

## 2023-10-23 DIAGNOSIS — E1140 Type 2 diabetes mellitus with diabetic neuropathy, unspecified: Secondary | ICD-10-CM | POA: Diagnosis present

## 2023-10-23 DIAGNOSIS — F32A Depression, unspecified: Secondary | ICD-10-CM | POA: Diagnosis present

## 2023-10-23 DIAGNOSIS — E785 Hyperlipidemia, unspecified: Secondary | ICD-10-CM | POA: Diagnosis present

## 2023-10-23 DIAGNOSIS — N289 Disorder of kidney and ureter, unspecified: Secondary | ICD-10-CM | POA: Insufficient documentation

## 2023-10-23 DIAGNOSIS — E1122 Type 2 diabetes mellitus with diabetic chronic kidney disease: Secondary | ICD-10-CM | POA: Diagnosis present

## 2023-10-23 DIAGNOSIS — H9191 Unspecified hearing loss, right ear: Secondary | ICD-10-CM | POA: Diagnosis present

## 2023-10-23 DIAGNOSIS — Z8249 Family history of ischemic heart disease and other diseases of the circulatory system: Secondary | ICD-10-CM

## 2023-10-23 DIAGNOSIS — Z1501 Genetic susceptibility to malignant neoplasm of breast: Secondary | ICD-10-CM | POA: Diagnosis not present

## 2023-10-23 DIAGNOSIS — Z8269 Family history of other diseases of the musculoskeletal system and connective tissue: Secondary | ICD-10-CM

## 2023-10-23 DIAGNOSIS — Z9221 Personal history of antineoplastic chemotherapy: Secondary | ICD-10-CM

## 2023-10-23 DIAGNOSIS — T8619 Other complication of kidney transplant: Principal | ICD-10-CM | POA: Diagnosis present

## 2023-10-23 DIAGNOSIS — Z7982 Long term (current) use of aspirin: Secondary | ICD-10-CM

## 2023-10-23 DIAGNOSIS — I10 Essential (primary) hypertension: Secondary | ICD-10-CM | POA: Diagnosis not present

## 2023-10-23 DIAGNOSIS — Z794 Long term (current) use of insulin: Secondary | ICD-10-CM | POA: Diagnosis not present

## 2023-10-23 DIAGNOSIS — Z853 Personal history of malignant neoplasm of breast: Secondary | ICD-10-CM

## 2023-10-23 DIAGNOSIS — K219 Gastro-esophageal reflux disease without esophagitis: Secondary | ICD-10-CM | POA: Diagnosis present

## 2023-10-23 DIAGNOSIS — Z9049 Acquired absence of other specified parts of digestive tract: Secondary | ICD-10-CM | POA: Diagnosis not present

## 2023-10-23 DIAGNOSIS — Z7985 Long-term (current) use of injectable non-insulin antidiabetic drugs: Secondary | ICD-10-CM

## 2023-10-23 DIAGNOSIS — N189 Chronic kidney disease, unspecified: Secondary | ICD-10-CM

## 2023-10-23 DIAGNOSIS — Z833 Family history of diabetes mellitus: Secondary | ICD-10-CM | POA: Diagnosis not present

## 2023-10-23 DIAGNOSIS — Z841 Family history of disorders of kidney and ureter: Secondary | ICD-10-CM

## 2023-10-23 DIAGNOSIS — Z79899 Other long term (current) drug therapy: Secondary | ICD-10-CM

## 2023-10-23 DIAGNOSIS — Z825 Family history of asthma and other chronic lower respiratory diseases: Secondary | ICD-10-CM

## 2023-10-23 DIAGNOSIS — N179 Acute kidney failure, unspecified: Secondary | ICD-10-CM | POA: Diagnosis present

## 2023-10-23 DIAGNOSIS — E11649 Type 2 diabetes mellitus with hypoglycemia without coma: Secondary | ICD-10-CM | POA: Diagnosis present

## 2023-10-23 DIAGNOSIS — I2510 Atherosclerotic heart disease of native coronary artery without angina pectoris: Secondary | ICD-10-CM | POA: Diagnosis present

## 2023-10-23 DIAGNOSIS — R634 Abnormal weight loss: Secondary | ICD-10-CM | POA: Insufficient documentation

## 2023-10-23 DIAGNOSIS — Y830 Surgical operation with transplant of whole organ as the cause of abnormal reaction of the patient, or of later complication, without mention of misadventure at the time of the procedure: Secondary | ICD-10-CM | POA: Diagnosis present

## 2023-10-23 DIAGNOSIS — Z888 Allergy status to other drugs, medicaments and biological substances status: Secondary | ICD-10-CM | POA: Diagnosis not present

## 2023-10-23 DIAGNOSIS — N1832 Chronic kidney disease, stage 3b: Secondary | ICD-10-CM | POA: Diagnosis present

## 2023-10-23 DIAGNOSIS — R6881 Early satiety: Secondary | ICD-10-CM | POA: Insufficient documentation

## 2023-10-23 DIAGNOSIS — Z9013 Acquired absence of bilateral breasts and nipples: Secondary | ICD-10-CM

## 2023-10-23 DIAGNOSIS — Z7952 Long term (current) use of systemic steroids: Secondary | ICD-10-CM

## 2023-10-23 DIAGNOSIS — R1010 Upper abdominal pain, unspecified: Secondary | ICD-10-CM | POA: Insufficient documentation

## 2023-10-23 DIAGNOSIS — Z8041 Family history of malignant neoplasm of ovary: Secondary | ICD-10-CM

## 2023-10-23 LAB — CBC WITH DIFFERENTIAL/PLATELET
Abs Immature Granulocytes: 0.01 10*3/uL (ref 0.00–0.07)
Basophils Absolute: 0.1 10*3/uL (ref 0.0–0.1)
Basophils Relative: 1 %
Eosinophils Absolute: 0.1 10*3/uL (ref 0.0–0.5)
Eosinophils Relative: 2 %
HCT: 34.8 % — ABNORMAL LOW (ref 36.0–46.0)
Hemoglobin: 11.5 g/dL — ABNORMAL LOW (ref 12.0–15.0)
Immature Granulocytes: 0 %
Lymphocytes Relative: 28 %
Lymphs Abs: 1.2 10*3/uL (ref 0.7–4.0)
MCH: 30.3 pg (ref 26.0–34.0)
MCHC: 33 g/dL (ref 30.0–36.0)
MCV: 91.6 fL (ref 80.0–100.0)
Monocytes Absolute: 0.4 10*3/uL (ref 0.1–1.0)
Monocytes Relative: 9 %
Neutro Abs: 2.6 10*3/uL (ref 1.7–7.7)
Neutrophils Relative %: 60 %
Platelets: 168 10*3/uL (ref 150–400)
RBC: 3.8 MIL/uL — ABNORMAL LOW (ref 3.87–5.11)
RDW: 11.8 % (ref 11.5–15.5)
WBC: 4.4 10*3/uL (ref 4.0–10.5)
nRBC: 0 % (ref 0.0–0.2)

## 2023-10-23 LAB — URINALYSIS, W/ REFLEX TO CULTURE (INFECTION SUSPECTED)
Bilirubin Urine: NEGATIVE
Glucose, UA: NEGATIVE mg/dL
Hgb urine dipstick: NEGATIVE
Ketones, ur: NEGATIVE mg/dL
Leukocytes,Ua: NEGATIVE
Nitrite: NEGATIVE
Protein, ur: NEGATIVE mg/dL
Specific Gravity, Urine: 1.005 (ref 1.005–1.030)
pH: 7 (ref 5.0–8.0)

## 2023-10-23 LAB — COMPREHENSIVE METABOLIC PANEL WITH GFR
ALT: 29 U/L (ref 0–44)
AST: 27 U/L (ref 15–41)
Albumin: 3.9 g/dL (ref 3.5–5.0)
Alkaline Phosphatase: 56 U/L (ref 38–126)
Anion gap: 9 (ref 5–15)
BUN: 59 mg/dL — ABNORMAL HIGH (ref 6–20)
CO2: 26 mmol/L (ref 22–32)
Calcium: 10.3 mg/dL (ref 8.9–10.3)
Chloride: 103 mmol/L (ref 98–111)
Creatinine, Ser: 4.26 mg/dL — ABNORMAL HIGH (ref 0.44–1.00)
GFR, Estimated: 12 mL/min — ABNORMAL LOW (ref >60)
Glucose, Bld: 188 mg/dL — ABNORMAL HIGH (ref 70–99)
Potassium: 4.3 mmol/L (ref 3.5–5.1)
Sodium: 138 mmol/L (ref 135–145)
Total Bilirubin: 0.7 mg/dL (ref 0.0–1.2)
Total Protein: 6.2 g/dL — ABNORMAL LOW (ref 6.5–8.1)

## 2023-10-23 LAB — HEMOGLOBIN A1C
Hgb A1c MFr Bld: 9 % — ABNORMAL HIGH (ref 4.8–5.6)
Mean Plasma Glucose: 211.6 mg/dL

## 2023-10-23 LAB — CBG MONITORING, ED
Glucose-Capillary: 62 mg/dL — ABNORMAL LOW (ref 70–99)
Glucose-Capillary: 63 mg/dL — ABNORMAL LOW (ref 70–99)

## 2023-10-23 LAB — GLUCOSE, CAPILLARY
Glucose-Capillary: 104 mg/dL — ABNORMAL HIGH (ref 70–99)
Glucose-Capillary: 121 mg/dL — ABNORMAL HIGH (ref 70–99)

## 2023-10-23 LAB — HIV ANTIBODY (ROUTINE TESTING W REFLEX): HIV Screen 4th Generation wRfx: NONREACTIVE

## 2023-10-23 MED ORDER — DEXTROSE 50 % IV SOLN: 1.0000 | INTRAVENOUS | Status: DC | PRN

## 2023-10-23 MED ORDER — PREDNISONE 5 MG PO TABS
5.0000 mg | ORAL_TABLET | Freq: Every day | ORAL | Status: DC
  Administered 2023-10-24 – 2023-10-25 (×2): 5 mg via ORAL
  Filled 2023-10-23 (×2): qty 1

## 2023-10-23 MED ORDER — PANTOPRAZOLE SODIUM 40 MG PO TBEC
40.0000 mg | DELAYED_RELEASE_TABLET | Freq: Two times a day (BID) | ORAL | Status: DC
  Administered 2023-10-23 – 2023-10-25 (×4): 40 mg via ORAL
  Filled 2023-10-23 (×4): qty 1

## 2023-10-23 MED ORDER — ALBUTEROL SULFATE (2.5 MG/3ML) 0.083% IN NEBU: 2.5000 mg | INHALATION_SOLUTION | Freq: Four times a day (QID) | RESPIRATORY_TRACT | Status: DC | PRN

## 2023-10-23 MED ORDER — INSULIN ASPART 100 UNIT/ML IJ SOLN
0.0000 [IU] | Freq: Three times a day (TID) | INTRAMUSCULAR | Status: DC
  Administered 2023-10-24 (×2): 1 [IU] via SUBCUTANEOUS
  Administered 2023-10-25: 2 [IU] via SUBCUTANEOUS
  Administered 2023-10-25: 1 [IU] via SUBCUTANEOUS

## 2023-10-23 MED ORDER — TACROLIMUS 1 MG PO CAPS
2.0000 mg | ORAL_CAPSULE | Freq: Every day | ORAL | Status: DC
  Administered 2023-10-23 – 2023-10-24 (×2): 2 mg via ORAL
  Filled 2023-10-23 (×2): qty 2

## 2023-10-23 MED ORDER — ACETAMINOPHEN 325 MG PO TABS
650.0000 mg | ORAL_TABLET | Freq: Four times a day (QID) | ORAL | Status: DC | PRN
  Administered 2023-10-23: 650 mg via ORAL
  Filled 2023-10-23: qty 2

## 2023-10-23 MED ORDER — INSULIN ASPART 100 UNIT/ML IJ SOLN: 0.0000 [IU] | Freq: Every day | INTRAMUSCULAR | Status: DC

## 2023-10-23 MED ORDER — SODIUM CHLORIDE 0.9% FLUSH
3.0000 mL | Freq: Two times a day (BID) | INTRAVENOUS | Status: DC
  Administered 2023-10-24 – 2023-10-25 (×2): 3 mL via INTRAVENOUS

## 2023-10-23 MED ORDER — HEPARIN SODIUM (PORCINE) 5000 UNIT/ML IJ SOLN
5000.0000 [IU] | Freq: Three times a day (TID) | INTRAMUSCULAR | Status: DC
  Administered 2023-10-23 – 2023-10-25 (×6): 5000 [IU] via SUBCUTANEOUS
  Filled 2023-10-23 (×7): qty 1

## 2023-10-23 MED ORDER — MYCOPHENOLATE SODIUM 180 MG PO TBEC
360.0000 mg | DELAYED_RELEASE_TABLET | Freq: Two times a day (BID) | ORAL | Status: DC
  Administered 2023-10-23 – 2023-10-25 (×4): 360 mg via ORAL
  Filled 2023-10-23 (×4): qty 2

## 2023-10-23 MED ORDER — HYDRALAZINE HCL 20 MG/ML IJ SOLN: 10.0000 mg | INTRAMUSCULAR | Status: DC | PRN

## 2023-10-23 MED ORDER — SODIUM CHLORIDE 0.9 % IV SOLN: INTRAVENOUS | Status: AC

## 2023-10-23 MED ORDER — ACETAMINOPHEN 650 MG RE SUPP: 650.0000 mg | Freq: Four times a day (QID) | RECTAL | Status: DC | PRN

## 2023-10-23 MED ORDER — TACROLIMUS ER 4 MG PO TB24
4.0000 mg | ORAL_TABLET | Freq: Every day | ORAL | Status: DC
Start: 2023-10-23 — End: 2023-10-23

## 2023-10-23 MED ORDER — TACROLIMUS 1 MG PO CAPS
3.0000 mg | ORAL_CAPSULE | Freq: Every day | ORAL | Status: DC
  Administered 2023-10-24 – 2023-10-25 (×2): 3 mg via ORAL
  Filled 2023-10-23 (×2): qty 3

## 2023-10-23 MED ORDER — SODIUM CHLORIDE 0.9 % IV BOLUS
1000.0000 mL | Freq: Once | INTRAVENOUS | Status: AC
  Administered 2023-10-23: 1000 mL via INTRAVENOUS

## 2023-10-23 MED ORDER — ROSUVASTATIN CALCIUM 5 MG PO TABS
5.0000 mg | ORAL_TABLET | Freq: Every day | ORAL | Status: DC
Start: 2023-10-23 — End: 2023-10-25
  Administered 2023-10-23 – 2023-10-24 (×2): 5 mg via ORAL
  Filled 2023-10-23 (×2): qty 1

## 2023-10-23 MED ORDER — ORAL CARE MOUTH RINSE: 15.0000 mL | OROMUCOSAL | Status: DC | PRN

## 2023-10-23 NOTE — ED Notes (Addendum)
 Pt cbg 63. Pt given juice to drink. Pt alert and oriented x4. GCS 15. MD notified.

## 2023-10-23 NOTE — Consult Note (Addendum)
 Reason for Consult: Acute renal failure  Chief Complaint: Abnormal lab value, abdominal discomfort  Assessment/Plan: Acute knee injury on CKD 3B with a baseline creatinine approximately 1.7-2.3  - acute component may be secondary to prerenal azotemia as the patient has not been eating and drinking very much in the past 2 weeks because of intermittent abdominal discomfort as well as depression. -Will check a transplant renal ultrasound to ensure no obstruction which is lower on the differential -Urinalysis, patient may have a UTI.  She has dysuria recently burning upon urination. -Continue immunosuppressive's at home doses; fortunately patient has not missed any immunosuppressive doses even though she has not been eating very much in the past 2 weeks.  Has had a history of missing doses when she is out of her medications which has happened on occasion. -Hydrate aggressively with isotonic fluid resuscitation; hold lasix . -If no improvement in renal function she will need to be transferred to [facility] with Dr. Noretta Bears for a renal biopsy to rule out rejection.  -Monitor Daily I/Os, Daily weight  -Maintain MAP>65 for optimal renal perfusion.  - Avoid nephrotoxic agents such as IV contrast, NSAIDs, and phosphate containing bowel preps (FLEETS)  Abdominal pain -unclear etiology awaiting workup but certainly should send a urinalysis to rule out UTI.  She does have symptoms of a urinary tract infection. Hypertension -does not tolerate amlodipine  which caused severe lower extremity edema Depression -she states that this has been worse recently but denies any suicidal or homicidal ideation. H/o breast cancer followed by Dr. Sonja Reserve   HPI: Cynthia [name] is an 53 y.o. female with a history of breast cancer diagnosed February 09, 2021 s/p b/l mastectomy with prophylactic TAH/BSO followed by Dr. 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  Of note her chronic viral testing was negative including CMV, BK and EBV.  Creatinine in July 25, 2023 was 2.2, UPC in early January 2025 was only 48 mg/g.  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  Creatinine has been rising over the past 6 months with creatinine at 1.9 on April 17, 2023, 2.27 October 03, 2023 and 4.37 on October 22, 2023.  Patient states that in the past 2 weeks she has had intermittent abdominal discomfort with dysuria as well but denies any fever chills, nausea, vomiting.  She has not been eating much solids at all and has only been drinking a little bit.  Of note she has been taking all her immunosuppressive's over the past 2 weeks.  She is also complaining of depression and worsening but denies any suicidal or homicidal ideation.  There were no sick contacts.  She denies any abdominal fullness after eating, sore throat, myalgias, chest pain, shortness of breath.  She does endorse being dizzy when she walks.   ROS Pertinent items are noted in HPI.  Chemistry and CBC: Creatinine  Date/Time Value Ref Range Status  04/17/2023 10:44 AM 1.89 (H) 0.44 - 1.00 mg/dL Final  13/01/6577 46:96 AM 1.47 (H) 0.44 - 1.00 mg/dL Final  29/52/8413 24:40 AM 1.12 (H) 0.44 - 1.00 mg/dL Final  04/21/2535 64:40 AM 2.13 (H) 0.44 - 1.00 mg/dL Final  34/74/2595 63:87 AM 1.34 (H) 0.44 - 1.00 mg/dL Final  56/43/3295 18:84 AM 1.37 (  H) 0.44 - 1.00 mg/dL Final  16/03/9603 54:09 AM 1.57 (H) 0.44 - 1.00 mg/dL Final  81/19/1478 29:56 AM 1.66 (H) 0.44 - 1.00 mg/dL Final  21/30/8657 84:69 AM 1.29 (H) 0.44 - 1.00 mg/dL Final  62/95/2841 32:44 AM 1.16 (H) 0.44 - 1.00 mg/dL Final  06/27/7251 66:44 PM 1.25 (H) 0.44 - 1.00 mg/dL Final  03/47/4259 56:38 AM 1.24 (H) 0.44 - 1.00 mg/dL Final   75/64/3329 51:88 AM 1.27 (H) 0.44 - 1.00 mg/dL Final   Creatinine, Ser  Date/Time Value Ref Range Status  10/23/2023 01:49 PM 4.26 (H) 0.44 - 1.00 mg/dL Final  41/66/0630 16:01 AM 4.37 (H) 0.40 - 1.20 mg/dL Final  09/32/3557 32:20 PM 2.27 (H) 0.44 - 1.00 mg/dL Final  25/42/7062 37:62 AM 1.72 (H) 0.44 - 1.00 mg/dL Final  83/15/1761 60:73 PM 2.07 (H) 0.44 - 1.00 mg/dL Final  71/11/2692 85:46 AM 1.59 (H) 0.44 - 1.00 mg/dL Final  27/08/5007 38:18 AM 2.09 (H) 0.44 - 1.00 mg/dL Final  29/93/7169 67:89 AM 2.30 (H) 0.44 - 1.00 mg/dL Final  38/03/1750 02:58 PM 2.60 (H) 0.44 - 1.00 mg/dL Final  52/77/8242 35:36 AM 2.59 (H) 0.44 - 1.00 mg/dL Final  14/43/1540 08:67 AM 0.99 0.44 - 1.00 mg/dL Final  61/95/0932 67:12 AM 1.08 (H) 0.44 - 1.00 mg/dL Final  45/80/9983 38:25 AM 1.07 (H) 0.44 - 1.00 mg/dL Final  05/39/7673 41:93 PM 1.17 (H) 0.44 - 1.00 mg/dL Final  79/07/4095 35:32 AM 1.26 (H) 0.44 - 1.00 mg/dL Final  99/24/2683 41:96 AM 1.28 (H) 0.44 - 1.00 mg/dL Final  22/29/7989 21:19 AM 1.36 (H) 0.44 - 1.00 mg/dL Final  41/74/0814 48:18 AM 1.61 (H) 0.44 - 1.00 mg/dL Final  56/31/4970 26:37 PM 1.21 (H) 0.44 - 1.00 mg/dL Final  85/88/5027 74:12 PM 1.24 (H) 0.44 - 1.00 mg/dL Final  87/86/7672 09:47 AM 1.58 (H) 0.44 - 1.00 mg/dL Final  09/62/8366 29:47 AM 0.90 0.44 - 1.00 mg/dL Final  65/46/5035 46:56 AM 0.93 0.44 - 1.00 mg/dL Final  81/27/5170 01:74 AM 0.84 0.44 - 1.00 mg/dL Final  94/49/6759 16:38 AM 0.77 0.44 - 1.00 mg/dL Final  46/65/9935 70:17 AM 1.18 (H) 0.44 - 1.00 mg/dL Final  79/39/0300 92:33 AM 1.30 (H) 0.44 - 1.00 mg/dL Final  00/76/2263 33:54 PM 1.51 (H) 0.44 - 1.00 mg/dL Final  56/25/6389 37:34 AM 1.11 (H) 0.57 - 1.00 mg/dL Final  28/76/8115 72:62 AM 1.27 (H) 0.44 - 1.00 mg/dL Final  03/55/9741 63:84 AM 2.40 (H) 0.44 - 1.00 mg/dL Final  53/64/6803 21:22 AM 1.59 (H) 0.44 - 1.00 mg/dL Final  48/25/0037 04:88 PM 1.18 (H) 0.44 - 1.00 mg/dL Final    Comment:    DELTA CHECK  NOTED DIALYSIS   11/18/2015 03:02 PM 2.95 (H) 0.44 - 1.00 mg/dL Final  89/16/9450 38:88 AM 3.20 (H) 0.44 - 1.00 mg/dL Final  28/00/3491 79:15 PM 3.59 (H) 0.44 - 1.00 mg/dL Final  05/69/7948 01:65 AM 5.20 (H) 0.44 - 1.00 mg/dL Final  53/74/8270 78:67 AM 4.39 (H) 0.44 - 1.00 mg/dL Final  54/49/2010 07:12 AM 4.69 (H) 0.44 - 1.00 mg/dL Final  19/75/8832 54:98 AM 4.17 (H) 0.44 - 1.00 mg/dL Final  26/41/5830 94:07 PM 8.30 (H) 0.44 - 1.00 mg/dL Final  68/01/8109 31:59 AM 11.08 (H) 0.44 - 1.00 mg/dL Final  45/85/9292 44:62 PM 9.39 (H) 0.44 - 1.00 mg/dL Final  86/38/1771 16:57 AM 10.47 (H) 0.44 - 1.00 mg/dL Final  90/38/3338 32:91 AM 8.10 (H) 0.44 - 1.00 mg/dL Final  91/66/0600 45:99  AM 9.93 (H) 0.50 - 1.10 mg/dL Final  16/03/9603 54:09 AM 6.88 (H) 0.50 - 1.10 mg/dL Final    Comment:    DELTA CHECK NOTED  03/29/2014 05:27 PM 4.37 (H) 0.50 - 1.10 mg/dL Final    Comment:    DELTA CHECK NOTED DIALYSIS  03/29/2014 06:16 AM 9.35 (H) 0.50 - 1.10 mg/dL Final  81/19/1478 29:56 AM 8.38 (H) 0.50 - 1.10 mg/dL Final  21/30/8657 84:69 AM 7.81 (H) 0.50 - 1.10 mg/dL Final  62/95/2841 32:44 AM 13.80 (H) 0.50 - 1.10 mg/dL Final   Recent Labs  Lab 10/22/23 1025 10/23/23 1349  NA 138 138  K 4.3 4.3  CL 100 103  CO2 30 26  GLUCOSE 170* 188*  BUN 57* 59*  CREATININE 4.37* 4.26*  CALCIUM  10.3 10.3   Recent Labs  Lab 10/22/23 1025 10/23/23 1349  WBC 4.8 4.4  NEUTROABS  --  2.6  HGB 12.3 11.5*  HCT 35.9* 34.8*  MCV 88.3 91.6  PLT 162.0 168   Liver Function Tests: Recent Labs  Lab 10/22/23 1025 10/23/23 1349  AST 24 27  ALT 27 29  ALKPHOS 68 56  BILITOT 0.6 0.7  PROT 6.1 6.2*  ALBUMIN  4.2 3.9   Recent Labs  Lab 10/22/23 1025  LIPASE 32.0  AMYLASE 39   No results for input(s): "AMMONIA" in the last 168 hours. Cardiac Enzymes: No results for input(s): "CKTOTAL", "CKMB", "CKMBINDEX", "TROPONINI" in the last 168 hours. Iron Studies:  Recent Labs    10/22/23 1025  IRON 96  TIBC  254.8  TRANSFERRIN 182.0*  FERRITIN 1,122.8*   PT/INR: @LABRCNTIP (inr:5)  Xrays/Other Studies: ) Results for orders placed or performed during the hospital encounter of 10/23/23 (from the past 48 hours)  Comprehensive metabolic panel     Status: Abnormal   Collection Time: 10/23/23  1:49 PM  Result Value Ref Range   Sodium 138 135 - 145 mmol/L   Potassium 4.3 3.5 - 5.1 mmol/L   Chloride 103 98 - 111 mmol/L   CO2 26 22 - 32 mmol/L   Glucose, Bld 188 (H) 70 - 99 mg/dL    Comment: Glucose reference range applies only to samples taken after fasting for at least 8 hours.   BUN 59 (H) 6 - 20 mg/dL   Creatinine, Ser 0.10 (H) 0.44 - 1.00 mg/dL   Calcium  10.3 8.9 - 10.3 mg/dL   Total Protein 6.2 (L) 6.5 - 8.1 g/dL   Albumin  3.9 3.5 - 5.0 g/dL   AST 27 15 - 41 U/L   ALT 29 0 - 44 U/L   Alkaline Phosphatase 56 38 - 126 U/L   Total Bilirubin 0.7 0.0 - 1.2 mg/dL   GFR, Estimated 12 (L) >60 mL/min    Comment: (NOTE) Calculated using the CKD-EPI Creatinine Equation (2021)    Anion gap 9 5 - 15    Comment: Performed at [facility], [address] [place], [place] [address]  CBC with Differential     Status: Abnormal   Collection Time: 10/23/23  1:49 PM  Result Value Ref Range   WBC 4.4 4.0 - 10.5 K/uL   RBC 3.80 (L) 3.87 - 5.11 MIL/uL   Hemoglobin 11.5 (L) 12.0 - 15.0 g/dL   HCT 66.4 (L) 40.3 - 47.4 %   MCV 91.6 80.0 - 100.0 fL   MCH 30.3 26.0 - 34.0 pg   MCHC 33.0 30.0 - 36.0 g/dL   RDW 25.9 56.3 - 87.5 %  Platelets 168 150 - 400 K/uL   nRBC 0.0 0.0 - 0.2 %   Neutrophils Relative % 60 %   Neutro Abs 2.6 1.7 - 7.7 K/uL   Lymphocytes Relative 28 %   Lymphs Abs 1.2 0.7 - 4.0 K/uL   Monocytes Relative 9 %   Monocytes Absolute 0.4 0.1 - 1.0 K/uL   Eosinophils Relative 2 %   Eosinophils Absolute 0.1 0.0 - 0.5 K/uL   Basophils Relative 1 %   Basophils Absolute 0.1 0.0 - 0.1 K/uL   Immature Granulocytes 0 %   Abs Immature Granulocytes 0.01 0.00 - 0.07 K/uL    Comment:  Performed at [facility], [address] [place], [place] [address]   No results found.  PMH:   Past Medical History:  Diagnosis Date   Allergy    pollen   Anemia    Anxiety    BRCA2 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 03/01/2021   Breast cancer [facility])    Cataract    surgical repair bilateral   Complication of anesthesia    not herself for 2 days after last surgery (brest lumpectomy)   Coronary artery disease    Depression    ESRD on hemodialysis (HCC)    Home HD 5x per week- not on dialysis now had tramsplant 4/17   Family history of ovarian cancer 02/22/2021   GERD (gastroesophageal reflux disease)    Hearing loss 2017   right ear   History of blood transfusion    transfusion reaction   Hyperlipidemia    Hypertension    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 per pt-dr levy also II; Per patient 08/18/21 she is Type 2   Kidney transplant recipient    Leukocytosis 06/13/2021   Neuromuscular disorder (HCC)    NEUROPATHY   Sleep apnea     PSH:   Past Surgical History:  Procedure Laterality Date   ABDOMINAL HYSTERECTOMY     BREAST EXCISIONAL BIOPSY Right 08/2015   BREAST LUMPECTOMY WITH RADIOACTIVE SEED LOCALIZATION Right 09/14/2015   Procedure: RIGHT BREAST LUMPECTOMY WITH RADIOACTIVE SEED LOCALIZATION;  Surgeon: Dareen Ebbing, MD;  Location: [facility];  Service: General;  Laterality: Right;   BREAST LUMPECTOMY WITH RADIOACTIVE SEED LOCALIZATION Left 03/02/2021   Procedure: LEFT BREAST SEED LUMPECTOMY LEFT SENTINEL LYMPH NODE MAPPING;  Surgeon: Sim Dryer, MD;  Location: [facility];  Service: General;  Laterality: Left;  GEN AND PEC BLOCK   BREAST SURGERY Bilateral    biopsy bilateral   CARDIAC CATHETERIZATION     CATARACT EXTRACTION Bilateral    bilateral   CHOLECYSTECTOMY     CYST REMOVAL NECK     DIALYSIS FISTULA CREATION Left    EYE SURGERY Bilateral    lazer   kidney  transplant     LEFT HEART CATH AND CORONARY ANGIOGRAPHY N/A 03/04/2019   Procedure: LEFT HEART CATH AND CORONARY ANGIOGRAPHY;  Surgeon: Swaziland, Peter M, MD;  Location: [facility]VAS[facility];  Service: Cardiovascular;  Laterality: N/A;   LEFT HEART CATHETERIZATION WITH CORONARY ANGIOGRAM N/A 03/30/2014   Procedure: LEFT HEART CATHETERIZATION WITH CORONARY ANGIOGRAM;  Surgeon: Mickiel Albany, MD;  Location: [facility] CATH LAB;  Service: Cardiovascular;  Laterality: N/A;   LIPOMA EXCISION N/A 02/06/2017   Procedure: EXCISION POSTERIOR NECK SEBACEOUS CYST;  Surgeon: Oza Blumenthal, MD;  Location: [facility] OR;  Service: General;  Laterality: N/A;   RESECTION OF ARTERIOVENOUS FISTULA ANEURYSM  Left 07/07/2015   Procedure: REPAIR OF ARTERIOVENOUS FISTULA ANEURYSM;  Surgeon: Margherita Shell, MD;  Location[facility] OR;  Service: Vascular;  Laterality: Left;   REVISON OF ARTERIOVENOUS FISTULA Left 09/28/2013   Procedure: EXCISE ESCHAR LEFT ARM  ARTERIOVENOUS FISTULA WITH PLICATION OF LEFT ARM ARTERIOVENOUS FISTULA;  Surgeon: Richrd Char, MD;  Location: [facility] OR;  Service: Vascular;  Laterality: Left;   REVISON OF ARTERIOVENOUS FISTULA Left 08/26/2015   Procedure: RESECTION ANEURYSM OF LEFT ARM ARTERIOVENOUS FISTULA  ;  Surgeon: Margherita Shell, MD;  Location: [facility] OR;  Service: Vascular;  Laterality: Left;   ROBOTIC ASSISTED BILATERAL SALPINGO OOPHERECTOMY N/A 07/11/2022   Procedure: XI ROBOTIC ASSISTED BILATERAL SALPINGO OOPHORECTOMY;  Surgeon: Suzi Essex, MD;  Location: [facility]S;  Service: Gynecology;  Laterality: N/A;   SIMPLE MASTECTOMY WITH AXILLARY SENTINEL NODE BIOPSY Bilateral 08/22/2021   Procedure: BILATERAL SIMPLE MASTECTOMY;  Surgeon: Sim Dryer, MD;  Location: [facility] OR;  Service: General;  Laterality: Bilateral;   TUBAL LIGATION      Allergies:  Allergies  Allergen Reactions   Docetaxel  Nausea Only    Diaphoretic.  Patient had hypersensitivity reaction to docetaxel .  See progress note from  05/24/2021.  Patient able to complete infusion.   Norvasc  [Amlodipine ] Swelling   Tape Other (See Comments)    Burns skin.  Please use paper tape only    Medications:   Prior to Admission medications   Medication Sig Start Date End Date Taking? Authorizing Provider  Accu-Chek Softclix Lancets lancets Use as instructed Patient taking differently: 1 each by Other route See admin instructions. Use as instructed 08/03/20   Gwyndolyn Lerner, MD  aspirin  EC 81 MG tablet Take 81 mg by mouth daily. 08/01/21   [provider]  Blood Glucose Monitoring Suppl (ACCU-CHEK AVIVA CONNECT) w/Device KIT Check sugar 1x daily Patient taking differently: 1 each by Other route See admin instructions. Check sugar 1x daily 08/03/20   Gwyndolyn Lerner, MD  carvedilol  (COREG ) 25 MG tablet Take 25 mg by mouth 2 (two) times daily with a meal. 11/23/15   [provider]  cholecalciferol  (VITAMIN D3) 25 MCG (1000 UNIT) tablet Take 1,000 Units by mouth daily.    [provider]  cyanocobalamin 1000 MCG tablet Take 1,000 mcg by mouth daily.    [provider]  DROPLET PEN NEEDLES 31G X 8 MM MISC  03/30/22   [provider]  ENVARSUS  XR 1 MG TB24 Take 4 mg by mouth daily. 08/22/22   [provider]  escitalopram  (LEXAPRO ) 10 MG tablet Take 10 mg by mouth daily. 03/08/21   [provider]  furosemide  (LASIX ) 40 MG tablet Take 40 mg by mouth 2 (two) times daily as needed for fluid.    [provider]  gabapentin  (NEURONTIN ) 300 MG capsule Take 2 capsules (600 mg total) by mouth 2 (two) times daily. 05/16/23   Boscia, Heather E, NP  Garlic 100 MG TABS Take 100 mg by mouth daily.    [provider]  glucose blood (ACCU-CHEK AVIVA PLUS) test strip Check sugar 1x daily Patient taking differently: 1 each by Other route See admin instructions. Check sugar 1x daily 08/03/20   Gwyndolyn Lerner, MD  hydrALAZINE  (APRESOLINE ) 25 MG tablet Take 25 mg by mouth 2 (two) times  daily. 05/11/22   [provider]  HYDROcodone -acetaminophen  (NORCO/VICODIN) 5-325 MG tablet Take 1-2 tablets by mouth every 4 (four) hours as needed. 10/03/23   Hershel Los, MD  insulin  glargine (LANTUS   SOLOSTAR) 100 UNIT/ML Solostar Pen Inject 25-30 Units into the skin See admin instructions. Inject 30 units into the skin in the morning and 25 units at bedtime    [provider]  Insulin  Pen Needle (PEN NEEDLES) 30G X 8 MM MISC 1 each by Does not apply route daily. E11.9 09/01/19   Gwyndolyn Lerner, MD  magnesium  oxide (MAG-OX) 400 MG tablet Take 400 mg by mouth 2 (two) times daily.    [provider]  multivitamin-lutein (OCUVITE-LUTEIN) CAPS capsule Take 1 capsule by mouth daily.    [provider]  mycophenolate  (MYFORTIC ) 180 MG EC tablet Take 360 mg by mouth 2 (two) times daily.    [provider]  ondansetron  (ZOFRAN ) 8 MG tablet Take 8 mg by mouth 2 (two) times daily.    [provider]  pantoprazole  (PROTONIX ) 40 MG tablet Take 1 tablet (40 mg total) by mouth 2 (two) times daily. 07/27/22   Mansouraty, Albino Alu., MD  predniSONE  (DELTASONE ) 5 MG tablet Take 5 mg by mouth daily.  01/04/17   [provider]  promethazine  (PHENERGAN ) 12.5 MG tablet Take 1 tablet (12.5 mg total) by mouth every 8 (eight) hours as needed for nausea. 10/22/23   Mansouraty, Albino Alu., MD  rosuvastatin  (CRESTOR ) 5 MG tablet Take 5 mg by mouth at bedtime.    [provider]  senna-docusate (SENOKOT-S) 8.6-50 MG tablet Take 2 tablets by mouth at bedtime. For AFTER surgery, do not take if having diarrhea 07/06/22   Vira Grieves D, NP  tamoxifen  (NOLVADEX ) 20 MG tablet TAKE 1 TABLET(20 MG) BY MOUTH DAILY 03/18/23   Burton, [name] K, NP  tirzepatide [facility]) 12.5 MG/0.5ML Pen Inject 12.5 mg into the skin once a week. 02/01/22   [provider]  Vibegron (GEMTESA) 75 MG TABS Take 75 mg by mouth daily.    [provider]    Discontinued  Meds:  There are no discontinued medications.  Social History:  reports that she has never smoked. She has never used smokeless tobacco. She reports that she does not currently use alcohol. She reports that she does not use drugs.  Family History:   Family History  Problem Relation Age of Onset   Diabetes Mother    Kidney disease Mother    Diabetes Father    Heart disease Father    Kidney disease Father    Hypertension Father    Diabetes Sister    Asthma Sister    Lupus Sister    Diabetes Brother    Diabetes Brother    Diabetes Brother    Diabetes Brother    Cancer Maternal Grandmother 50       ovarian cancer   Ovarian cancer Maternal Grandmother    Diabetes Maternal Grandfather    Diabetes Paternal Grandmother    Colon cancer Neg Hx    Colon polyps Neg Hx    Esophageal cancer Neg Hx    Rectal cancer Neg Hx    Stomach cancer Neg Hx    Breast cancer Neg Hx    Endometrial cancer Neg Hx    Pancreatic cancer Neg Hx    Prostate cancer Neg Hx    Inflammatory bowel disease Neg Hx    Liver disease Neg Hx     Blood pressure 132/62, pulse 77, temperature 98.1 F (36.7 C), temperature source Oral, resp. rate 17, height 5\' 1"  (1.549 m), weight 69 kg, SpO2 100%. GEN: NAD, A&Ox3, NCAT HEENT: No conjunctival pallor, EOMI NECK: Supple, no  thyromegaly LUNGS: CTA B/L no rales, rhonchi or wheezing CV: RRR, No M/R/G ABD: SNDNT +BS,  transplant kidney in the RLK, no tenderness to palpation EXT: No lower extremity edema ACCESS: Left brachiocephalic fistula good bruit       Chavis Tessler, Alveda Aures, MD 10/23/2023, 3:53 PM

## 2023-10-23 NOTE — ED Notes (Signed)
 Notified floor nurse of patient blood sugar management and that pt was otw up to floor.

## 2023-10-23 NOTE — ED Triage Notes (Signed)
 PT sent from PCP for Creatinine 4.3. Pt had kidney transplant 2017. Denies any symptoms.

## 2023-10-23 NOTE — H&P (Signed)
 History and Physical    Patient: Cynthia [name] NWG:[id number] DOB: [birth year] DOA: 10/23/2023 DOS: the patient was seen and examined on 10/23/2023 PCP: Hershell Lose, NP  Patient coming from: Home  Chief Complaint:  Chief Complaint  Patient presents with   Abnormal Labs   HPI: Cynthia [name] is a 53 y.o. female with medical history significant of CKD stage IIIb, s/p renal transplant, breast cancer s/p chemo and bilateral mastectomyh elevated creatinine levels.  She has been experiencing nausea, particularly when eating, for a little over two weeks, leading to a decreased appetite. She has consulted her gastroenterologist regarding these symptoms, who planned to perform an endoscopy and CT scan of her abdomen pelvis to assess her stomach condition. However, during blood work, her elevated creatinine levels were noted to be elevated up to 4.37 with BUN 57.  She was sent to the hospital for further evaluation.  Review of labs note lipase 32 and LFTs also within normal limits.  No recent symptoms of vomiting or diarrhea, except for the nausea associated with eating. She also denies any abdominal pain, attributing her stomach discomfort to hunger currently.   In  the emergency department patient was noted to be afebrile with stable vital signs.  Labs noted BUN 59 and creatinine 4.26.  Baseline creatinine previously noted to be around 1.7-2.3.  Nephrology was consulted with recommendations to check renal ultrasound and aggressive IV fluid rehydration.  Review of Systems: As mentioned in the history of present illness. All other systems reviewed and are negative. Past Medical History:  Diagnosis Date   Allergy    pollen   Anemia    Anxiety    BRCA2 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 03/01/2021   Breast cancer [facility])    Cataract    surgical repair bilateral   Complication of anesthesia    not herself for 2 days after last surgery (brest lumpectomy)   Coronary artery disease    Depression     ESRD on hemodialysis (HCC)    Home HD 5x per week- not on dialysis now had tramsplant 4/17   Family history of ovarian cancer 02/22/2021   GERD (gastroesophageal reflux disease)    Hearing loss 2017   right ear   History of blood transfusion    transfusion reaction   Hyperlipidemia    Hypertension    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 per pt-dr levy also II; Per patient 08/18/21 she is Type 2   Kidney transplant recipient    Leukocytosis 06/13/2021   Neuromuscular disorder (HCC)    NEUROPATHY   Sleep apnea    Past Surgical History:  Procedure Laterality Date   ABDOMINAL HYSTERECTOMY     BREAST EXCISIONAL BIOPSY Right 08/2015   BREAST LUMPECTOMY WITH RADIOACTIVE SEED LOCALIZATION Right 09/14/2015   Procedure: RIGHT BREAST LUMPECTOMY WITH RADIOACTIVE SEED LOCALIZATION;  Surgeon: Dareen Ebbing, MD;  Location: [facility];  Service: General;  Laterality: Right;   BREAST LUMPECTOMY WITH RADIOACTIVE SEED LOCALIZATION Left 03/02/2021   Procedure: LEFT BREAST SEED LUMPECTOMY LEFT SENTINEL LYMPH NODE MAPPING;  Surgeon: Sim Dryer, MD;  Location: [facility];  Service: General;  Laterality: Left;  GEN AND PEC BLOCK   BREAST SURGERY Bilateral    biopsy bilateral   CARDIAC CATHETERIZATION     CATARACT EXTRACTION Bilateral    bilateral   CHOLECYSTECTOMY     CYST REMOVAL NECK     DIALYSIS FISTULA CREATION Left  EYE SURGERY Bilateral    lazer   kidney transplant     LEFT HEART CATH AND CORONARY ANGIOGRAPHY N/A 03/04/2019   Procedure: LEFT HEART CATH AND CORONARY ANGIOGRAPHY;  Surgeon: Swaziland, Peter M, MD;  Location: [facility] INVASIVE CV LAB;  Service: Cardiovascular;  Laterality: N/A;   LEFT HEART CATHETERIZATION WITH CORONARY ANGIOGRAM N/A 03/30/2014   Procedure: LEFT HEART CATHETERIZATION WITH CORONARY ANGIOGRAM;  Surgeon: Mickiel Albany, MD;  Location: [facility] CATH LAB;  Service: Cardiovascular;   Laterality: N/A;   LIPOMA EXCISION N/A 02/06/2017   Procedure: EXCISION POSTERIOR NECK SEBACEOUS CYST;  Surgeon: Oza Blumenthal, MD;  Location: [facility] OR;  Service: General;  Laterality: N/A;   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   Procedure: REPAIR OF ARTERIOVENOUS FISTULA ANEURYSM;  Surgeon: Margherita Shell, MD;  Location: [facility] OR;  Service: Vascular;  Laterality: Left;   REVISON OF ARTERIOVENOUS FISTULA Left 09/28/2013   Procedure: EXCISE ESCHAR LEFT ARM  ARTERIOVENOUS FISTULA WITH PLICATION OF LEFT ARM ARTERIOVENOUS FISTULA;  Surgeon: Richrd Char, MD;  Location: [facility] OR;  Service: Vascular;  Laterality: Left;   REVISON OF ARTERIOVENOUS FISTULA Left 08/26/2015   Procedure: RESECTION ANEURYSM OF LEFT ARM ARTERIOVENOUS FISTULA  ;  Surgeon: Margherita Shell, MD;  Location: [facility] OR;  Service: Vascular;  Laterality: Left;   ROBOTIC ASSISTED BILATERAL SALPINGO OOPHERECTOMY N/A 07/11/2022   Procedure: XI ROBOTIC ASSISTED BILATERAL SALPINGO OOPHORECTOMY;  Surgeon: Suzi Essex, MD;  Location: [facility] ORS;  Service: Gynecology;  Laterality: N/A;   SIMPLE MASTECTOMY WITH AXILLARY SENTINEL NODE BIOPSY Bilateral 08/22/2021   Procedure: BILATERAL SIMPLE MASTECTOMY;  Surgeon: Sim Dryer, MD;  Location: [facility] OR;  Service: General;  Laterality: Bilateral;   TUBAL LIGATION     Social History:  reports that she has never smoked. She has never used smokeless tobacco. She reports that she does not currently use alcohol. She reports that she does not use drugs.  Allergies  Allergen Reactions   Docetaxel  Nausea Only    Diaphoretic.  Patient had hypersensitivity reaction to docetaxel .  See progress note from 05/24/2021.  Patient able to complete infusion.   Norvasc  [Amlodipine ] Swelling   Tape Other (See Comments)    Burns skin.  Please use paper tape only    Family History  Problem Relation Age of Onset   Diabetes Mother    Kidney disease Mother    Diabetes Father    Heart disease  Father    Kidney disease Father    Hypertension Father    Diabetes Sister    Asthma Sister    Lupus Sister    Diabetes Brother    Diabetes Brother    Diabetes Brother    Diabetes Brother    Cancer Maternal Grandmother 50       ovarian cancer   Ovarian cancer Maternal Grandmother    Diabetes Maternal Grandfather    Diabetes Paternal Grandmother    Colon cancer Neg Hx    Colon polyps Neg Hx    Esophageal cancer Neg Hx    Rectal cancer Neg Hx    Stomach cancer Neg Hx    Breast cancer Neg Hx    Endometrial cancer Neg Hx    Pancreatic cancer Neg Hx    Prostate cancer Neg Hx    Inflammatory bowel disease Neg Hx    Liver disease Neg Hx     Prior to Admission medications   Medication Sig Start Date End Date Taking? Authorizing Provider  Accu-Chek Softclix  Lancets lancets Use as instructed Patient taking differently: 1 each by Other route See admin instructions. Use as instructed 08/03/20   Gwyndolyn Lerner, MD  aspirin  EC 81 MG tablet Take 81 mg by mouth daily. 08/01/21   [provider]  Blood Glucose Monitoring Suppl (ACCU-CHEK AVIVA CONNECT) w/Device KIT Check sugar 1x daily Patient taking differently: 1 each by Other route See admin instructions. Check sugar 1x daily 08/03/20   Gwyndolyn Lerner, MD  carvedilol  (COREG ) 25 MG tablet Take 25 mg by mouth 2 (two) times daily with a meal. 11/23/15   [provider]  cholecalciferol  (VITAMIN D3) 25 MCG (1000 UNIT) tablet Take 1,000 Units by mouth daily.    [provider]  cyanocobalamin 1000 MCG tablet Take 1,000 mcg by mouth daily.    [provider]  DROPLET PEN NEEDLES 31G X 8 MM MISC  03/30/22   [provider]  ENVARSUS  XR 1 MG TB24 Take 4 mg by mouth daily. 08/22/22   [provider]  escitalopram  (LEXAPRO ) 10 MG tablet Take 10 mg by mouth daily. 03/08/21   [provider]  furosemide  (LASIX ) 40 MG tablet Take 40 mg by mouth 2 (two) times daily as needed for fluid.    [provider]  gabapentin  (NEURONTIN ) 300 MG capsule Take 2 capsules (600 mg total) by mouth 2 (two) times daily. 05/16/23   Boscia, Heather E, NP  Garlic 100 MG TABS Take 100 mg by mouth daily.    [provider]  glucose blood (ACCU-CHEK AVIVA PLUS) test strip Check sugar 1x daily Patient taking differently: 1 each by Other route See admin instructions. Check sugar 1x daily 08/03/20   Gwyndolyn Lerner, MD  hydrALAZINE  (APRESOLINE ) 25 MG tablet Take 25 mg by mouth 2 (two) times daily. 05/11/22   [provider]  HYDROcodone -acetaminophen  (NORCO/VICODIN) 5-325 MG tablet Take 1-2 tablets by mouth every 4 (four) hours as needed. 10/03/23   Hershel Los, MD  insulin  glargine (LANTUS  SOLOSTAR) 100 UNIT/ML Solostar Pen Inject 25-30 Units into the skin See admin instructions. Inject 30 units into the skin in the morning and 25 units at bedtime    [provider]  Insulin  Pen Needle (PEN NEEDLES) 30G X 8 MM MISC 1 each by Does not apply route daily. E11.9 09/01/19   Gwyndolyn Lerner, MD  magnesium  oxide (MAG-OX) 400 MG tablet Take 400 mg by mouth 2 (two) times daily.    [provider]  multivitamin-lutein (OCUVITE-LUTEIN) CAPS capsule Take 1 capsule by mouth daily.    [provider]  mycophenolate  (MYFORTIC ) 180 MG EC tablet Take 360 mg by mouth 2 (two) times daily.    [provider]  ondansetron  (ZOFRAN ) 8 MG tablet Take 8 mg by mouth 2 (two) times daily.    [provider]  pantoprazole  (PROTONIX ) 40 MG tablet Take 1 tablet (40 mg total) by mouth 2 (two) times daily. 07/27/22   Mansouraty, Albino Alu., MD  predniSONE  (DELTASONE ) 5 MG tablet Take 5 mg by mouth daily.  01/04/17   [provider]  promethazine  (PHENERGAN ) 12.5 MG tablet Take 1 tablet (12.5 mg total) by mouth every 8 (eight) hours as needed for nausea. 10/22/23   Mansouraty, Albino Alu., MD  rosuvastatin  (CRESTOR ) 5 MG tablet Take 5 mg by mouth at bedtime.    [provider]  senna-docusate (SENOKOT-S) 8.6-50 MG tablet Take 2 tablets by mouth at bedtime. For AFTER surgery, do not take if having diarrhea 07/06/22  Vira Grieves D, NP  tamoxifen  (NOLVADEX ) 20 MG tablet TAKE 1 TABLET(20 MG) BY MOUTH DAILY 03/18/23   Burton, [name] K, NP  tirzepatide [facility]) 12.5 MG/0.5ML Pen Inject 12.5 mg into the skin once a week. 02/01/22   [provider]  Vibegron (GEMTESA) 75 MG TABS Take 75 mg by mouth daily.    [provider]    Physical Exam: Vitals:   10/23/23 1321 10/23/23 1333  BP: 132/62   Pulse: 77   Resp: 17   Temp: 98.1 F (36.7 C)   TempSrc: Oral   SpO2: 100%   Weight:  69 kg  Height:  5\' 1"  (1.549 m)   Constitutional: Middle-aged female currently in no acute distress Eyes: PERRL, lids and conjunctivae normal ENMT: Mucous membranes are moist. Normal dentition.  Neck: normal, supple  Respiratory: clear to auscultation bilaterally, no wheezing, no crackles. Normal respiratory effort. No accessory muscle use.  Cardiovascular: Regular rate and rhythm, no murmurs / rubs / gallops. No extremity edema. 2+ pedal pulses.   Abdomen: no tenderness, no masses palpated.  Bowel sounds positive.  Musculoskeletal: no clubbing / cyanosis. No joint deformity upper and lower extremities. Good ROM, no contractures. Normal muscle tone.  Skin: no rashes, lesions, ulcers. No induration Neurologic: CN 2-12 grossly intact. Strength 5/5 in all 4.  Psychiatric: Normal judgment and insight. Alert and oriented x 3. Normal mood.   Data Reviewed:  Reviewed labs, imaging, and pertinent records as documented.  Assessment and Plan:  Acute kidney injury superimposed on chronic kidney disease S/p renal transplant Patient presents with creatinine elevated up to 4.26 with BUN 59.  Notes poor p.o. intake for the last 2 weeks due to persistent nausea.  Baseline creatinine previously noted to range from 1.7-2.3.  Nephrology consulted.  Patient was given 1 L bolus of  IV fluids.  Renal transplant ultrasound with Doppler noted normal ultrasound appearance of the right lower quadrant kidney transplant with normal renal artery velocities. - Admit to telemetry bed - Monitor intake and output - Avoid nephrotoxic agents - Follow-up urinalysis with reflex to culture - Check CT scan of the abdomen pelvis as previously recommended - Continue IV fluids at 125 mL/h - Continue prednisone , tacrolimus , and mycophenolate  - Recheck kidney function in a.m. - Antiemetics as needed  Uncontrolled diabetes mellitus type 2 with hypoglycemia, with long-term use of insulin  On admission glucose noted to be 188, but repeat check noted to be as low as 63. - Hypoglycemia protocols - Check hemoglobin A1c (9) - Held home long-acting insulin  - D50 as needed - CBGs before every meal with sensitive SSI - Adjust insulin  regimen as needed  Essential hypertension Blood pressures initially elevated to 178/64. - Resume home blood pressure regimen once verified - Hydralazine  IV as needed for elevated blood pressures overnight  History of breast cancer S/p initial lumpectomy and chemotherapy in 2022.  BRCA 2 gene patient positive patient had subsequent double mastectomy with Dr. Afton Horse in 07/2021.  Hyperlipidemia - Continue Crestor    DVT prophylaxis: Heparin  Advance Care Planning:   Code Status: Full Code   Consults: Nephrology  Family Communication: None  Severity of Illness: The appropriate patient status for this patient is INPATIENT. Inpatient status is judged to be reasonable and necessary in order to provide the required intensity of service to ensure the patient's safety. The patient's presenting symptoms, physical exam findings, and initial radiographic and laboratory data in the context of their chronic comorbidities is felt to place them at high risk  for further clinical deterioration. Furthermore, it is not anticipated that the patient will be medically stable for  discharge from the hospital within 2 midnights of admission.   * I certify that at the point of admission it is my clinical judgment that the patient will require inpatient hospital care spanning beyond 2 midnights from the point of admission due to high intensity of service, high risk for further deterioration and high frequency of surveillance required.*  Author: Lena Qualia, MD 10/23/2023 4:58 PM  For on call review [email or web address].

## 2023-10-23 NOTE — Progress Notes (Signed)
 New Admission Note:  Arrival Method: Stretcher Mental Orientation: Alert and oriented x 4 Telemetry: Box 10 Assessment: Completed Skin: Warm and dry. B breast mastectomy IV: NSL Pain: Denies  Tubes: N/A Safety Measures: Safety Fall Prevention Plan initiated.  Admission: Completed 5 M  Orientation: Patient has been orientated to the room, unit and the staff. Welcome booklet given.  Family: None  Orders have been reviewed and implemented. Will continue to monitor the patient. Call light has been placed within reach and bed alarm has been activated.   Woodward Head BSN, RN  Phone Number: [phone]

## 2023-10-23 NOTE — Plan of Care (Signed)
   Problem: Education: Goal: Knowledge of General Education information will improve Description Including pain rating scale, medication(s)/side effects and non-pharmacologic comfort measures Outcome: Progressing

## 2023-10-23 NOTE — ED Provider Notes (Signed)
 [place] EMERGENCY DEPARTMENT AT [facility] Provider Note   CSN: [id number] Arrival date & time: 10/23/23  1306     History  Chief Complaint  Patient presents with   Abnormal Labs    Cynthia [name] is a 53 y.o. female.  Patient is a 53 year old female who presents with abnormal blood work.  She has been having some intermittent epigastric discomfort and abdominal bloating.  She is followed by gastroenterology.  They did some recent blood work which showed markedly elevated creatinine at over 4.  She is status post kidney transplant in 2017 at [facility].  She has a bit of a headache which she says she has had before.  Otherwise she is not having any new symptoms.  No nausea vomiting or diarrhea.  She says she is urinating normally.  No pain with urination.  No fevers.  Other than her intermittent epigastric discomfort, she is not having any other abdominal or back pain.       Home Medications Prior to Admission medications   Medication Sig Start Date End Date Taking? Authorizing Provider  Accu-Chek Softclix Lancets lancets Use as instructed Patient taking differently: 1 each by Other route See admin instructions. Use as instructed 08/03/20   Gwyndolyn Lerner, MD  aspirin  EC 81 MG tablet Take 81 mg by mouth daily. 08/01/21   [provider]  Blood Glucose Monitoring Suppl (ACCU-CHEK AVIVA CONNECT) w/Device KIT Check sugar 1x daily Patient taking differently: 1 each by Other route See admin instructions. Check sugar 1x daily 08/03/20   Gwyndolyn Lerner, MD  carvedilol  (COREG ) 25 MG tablet Take 25 mg by mouth 2 (two) times daily with a meal. 11/23/15   [provider]  cholecalciferol  (VITAMIN D3) 25 MCG (1000 UNIT) tablet Take 1,000 Units by mouth daily.    [provider]  cyanocobalamin 1000 MCG tablet Take 1,000 mcg by mouth daily.    [provider]  DROPLET PEN NEEDLES 31G X 8 MM MISC  03/30/22   [provider]  ENVARSUS  XR 1 MG TB24 Take  4 mg by mouth daily. 08/22/22   [provider]  escitalopram  (LEXAPRO ) 10 MG tablet Take 10 mg by mouth daily. 03/08/21   [provider]  furosemide  (LASIX ) 40 MG tablet Take 40 mg by mouth 2 (two) times daily as needed for fluid.    [provider]  gabapentin  (NEURONTIN ) 300 MG capsule Take 2 capsules (600 mg total) by mouth 2 (two) times daily. 05/16/23   Boscia, Heather E, NP  Garlic 100 MG TABS Take 100 mg by mouth daily.    [provider]  glucose blood (ACCU-CHEK AVIVA PLUS) test strip Check sugar 1x daily Patient taking differently: 1 each by Other route See admin instructions. Check sugar 1x daily 08/03/20   Gwyndolyn Lerner, MD  hydrALAZINE  (APRESOLINE ) 25 MG tablet Take 25 mg by mouth 2 (two) times daily. 05/11/22   [provider]  HYDROcodone -acetaminophen  (NORCO/VICODIN) 5-325 MG tablet Take 1-2 tablets by mouth every 4 (four) hours as needed. 10/03/23   Hershel Los, MD  insulin  glargine (LANTUS  SOLOSTAR) 100 UNIT/ML Solostar Pen Inject 25-30 Units into the skin See admin instructions. Inject 30 units into the skin in the morning and 25 units at bedtime    [provider]  Insulin  Pen Needle (PEN NEEDLES) 30G X 8 MM MISC 1 each by Does not apply route daily. E11.9 09/01/19   Gwyndolyn Lerner, MD  magnesium  oxide (MAG-OX) 400 MG tablet Take  400 mg by mouth 2 (two) times daily.    [provider]  multivitamin-lutein (OCUVITE-LUTEIN) CAPS capsule Take 1 capsule by mouth daily.    [provider]  mycophenolate  (MYFORTIC ) 180 MG EC tablet Take 360 mg by mouth 2 (two) times daily.    [provider]  ondansetron  (ZOFRAN ) 8 MG tablet Take 8 mg by mouth 2 (two) times daily.    [provider]  pantoprazole  (PROTONIX ) 40 MG tablet Take 1 tablet (40 mg total) by mouth 2 (two) times daily. 07/27/22   Mansouraty, Albino Alu., MD  predniSONE  (DELTASONE ) 5 MG tablet Take 5 mg by mouth daily.  01/04/17   [provider]  promethazine  (PHENERGAN ) 12.5 MG tablet Take 1 tablet (12.5 mg total) by mouth every 8 (eight) hours as needed for nausea. 10/22/23   Mansouraty, Albino Alu., MD  rosuvastatin  (CRESTOR ) 5 MG tablet Take 5 mg by mouth at bedtime.    [provider]  senna-docusate (SENOKOT-S) 8.6-50 MG tablet Take 2 tablets by mouth at bedtime. For AFTER surgery, do not take if having diarrhea 07/06/22   Vira Grieves D, NP  tamoxifen  (NOLVADEX ) 20 MG tablet TAKE 1 TABLET(20 MG) BY MOUTH DAILY 03/18/23   Burton, [name] K, NP  tirzepatide [facility]) 12.5 MG/0.5ML Pen Inject 12.5 mg into the skin once a week. 02/01/22   [provider]  Vibegron (GEMTESA) 75 MG TABS Take 75 mg by mouth daily.    [provider]      Allergies    Docetaxel , Norvasc  [amlodipine ], and Tape    Review of Systems   Review of Systems  Constitutional:  Negative for chills, diaphoresis, fatigue and fever.  HENT:  Negative for congestion, rhinorrhea and sneezing.   Eyes: Negative.   Respiratory:  Negative for cough, chest tightness and shortness of breath.   Cardiovascular:  Negative for chest pain and leg swelling.  Gastrointestinal:  Positive for abdominal pain (Intermittent epigastric discomfort). Negative for blood in stool, diarrhea, nausea and vomiting.  Genitourinary:  Negative for difficulty urinating, flank pain, frequency and hematuria.  Musculoskeletal:  Negative for arthralgias and back pain.  Skin:  Negative for rash.  Neurological:  Positive for headaches. Negative for dizziness, speech difficulty, weakness and numbness.    Physical Exam Updated Vital Signs BP 132/62 (BP Location: Right Arm)   Pulse 77   Temp 98.1 F (36.7 C) (Oral)   Resp 17   Ht 5\' 1"  (1.549 m)   Wt 69 kg   SpO2 100%   BMI 28.74 kg/m  Physical Exam Constitutional:      Appearance: She is well-developed.  HENT:     Head: Normocephalic and atraumatic.  Eyes:     Pupils: Pupils are equal, round, and  reactive to light.  Cardiovascular:     Rate and Rhythm: Normal rate and regular rhythm.     Heart sounds: Normal heart sounds.  Pulmonary:     Effort: Pulmonary effort is normal. No respiratory distress.     Breath sounds: Normal breath sounds. No wheezing or rales.  Chest:     Chest wall: No tenderness.  Abdominal:     General: Bowel sounds are normal.     Palpations: Abdomen is soft.     Tenderness: There is no abdominal tenderness. There is no guarding or rebound.  Musculoskeletal:        General: Normal range of motion.     Cervical back: Normal range of motion and neck supple.  Comments: Fistula located in the left upper arm with a good thrill  Lymphadenopathy:     Cervical: No cervical adenopathy.  Skin:    General: Skin is warm and dry.     Findings: No rash.  Neurological:     Mental Status: She is alert and oriented to person, place, and time.     ED Results / Procedures / Treatments   Labs (all labs ordered are listed, but only abnormal results are displayed) Labs Reviewed  COMPREHENSIVE METABOLIC PANEL WITH GFR - Abnormal; Notable for the following components:      Result Value   Glucose, Bld 188 (*)    BUN 59 (*)    Creatinine, Ser 4.26 (*)    Total Protein 6.2 (*)    GFR, Estimated 12 (*)    All other components within normal limits  CBC WITH DIFFERENTIAL/PLATELET - Abnormal; Notable for the following components:   RBC 3.80 (*)    Hemoglobin 11.5 (*)    HCT 34.8 (*)    All other components within normal limits  URINALYSIS, W/ REFLEX TO CULTURE (INFECTION SUSPECTED)  HIV ANTIBODY (ROUTINE TESTING W REFLEX)  CBC  COMPREHENSIVE METABOLIC PANEL WITH GFR    EKG None  Radiology No results found.  Procedures Procedures    Medications Ordered in ED Medications  sodium chloride  0.9 % bolus 1,000 mL (has no administration in time range)  heparin  injection 5,000 Units (has no administration in time range)  sodium chloride  flush (NS) 0.9 %  injection 3 mL (has no administration in time range)  acetaminophen  (TYLENOL ) tablet 650 mg (has no administration in time range)    Or  acetaminophen  (TYLENOL ) suppository 650 mg (has no administration in time range)  albuterol  (PROVENTIL ) (2.5 MG/3ML) 0.083% nebulizer solution 2.5 mg (has no administration in time range)  0.9 %  sodium chloride  infusion (has no administration in time range)    ED Course/ Medical Decision Making/ A&P                                 Medical Decision Making Problems Addressed: AKI (acute kidney injury) [facility]): acute illness or injury  Amount and/or Complexity of Data Reviewed External Data Reviewed: labs and notes. Labs: ordered. Decision-making details documented in ED Course. Radiology: ordered.  Risk Decision regarding hospitalization.   Patient is a 54 year old who presents with an AKI.  She has been having some GI issues and is being followed by gastroenterology.  Her creatinine is markedly elevated as compared to her baseline values from chart review. Her K+ is normal. She is not febrile.  No difficulty with urination.  I consulted with nephrology, Dr. Austine Blunt who has seen the patient.  He recommends admitting the patient for IV hydration.  She has been having overall decreased intake due to her GI issues and he felt that it may be prerenal in origin.  Recommends obtaining a renal transplant ultrasound and a urinalysis which has been ordered.  If she is not having improvement (this was ordered and is pending), may need to be transferred to [facility] but at this point will admit her to our facility.  Discussed with Dr. Felipe Horton who will admit the patient for further treatment.  Final Clinical Impression(s) / ED Diagnoses Final diagnoses:  AKI (acute kidney injury) (HCC)    Rx / DC Orders ED Discharge Orders     None  Hershel Los, MD 10/23/23 [phone]

## 2023-10-23 NOTE — Progress Notes (Signed)
 [facility] - Clinic Note  10/30/2023     CHIEF COMPLAINT Patient presents for Retina Follow Up   HISTORY OF PRESENT ILLNESS: Cynthia [name] is a 53 y.o. female who presents to the clinic today for:   HPI     Retina Follow Up   Patient presents with  Diabetic Retinopathy.  In both eyes.  Severity is moderate.  Duration of 1 year.  Since onset it is stable.  I, the attending physician,  performed the HPI with the patient and updated documentation appropriately.        Comments   Pt here for 1 year f/u PDR ou. Pt states she lost her rx specs 3 months ago so her vision hasn't been great. Isnt able to determine if VA has changed or not. A1C was 9 in April.       Last edited by Ronelle Coffee, MD on 10/30/2023  5:19 PM.     Patient feels the vision is not good because she lost her glasses. She is also complaining about allergy eye symptoms.   Referring physician: Sidra Dredge, Md [address] [place],  [place] [address]  HISTORICAL INFORMATION:   Selected notes from the MEDICAL RECORD NUMBER Referred by Dr. Diedre Fox for diabetic retinal eval LEE: 09/07/2021 BCVA OD: 20/30 OS: 20/25 Ocular Hx- PDR, pseudophakia, dry eye PMH- DM, HTN, depression, kidney transplant; last a1c was 7.3 on 08/18/21    CURRENT MEDICATIONS: No current outpatient medications on file. (Ophthalmic Drugs)   No current facility-administered medications for this visit. (Ophthalmic Drugs)   Current Outpatient Medications (Other)  Medication Sig   aspirin  EC 81 MG tablet Take 81 mg by mouth daily.   carvedilol  (COREG ) 25 MG tablet Take 25 mg by mouth 2 (two) times daily with a meal.   cholecalciferol  (VITAMIN D3) 25 MCG (1000 UNIT) tablet Take 1,000 Units by mouth daily.   cyanocobalamin  1000 MCG tablet Take 1,000 mcg by mouth daily.   escitalopram  (LEXAPRO ) 10 MG tablet Take 10 mg by mouth daily.   furosemide  (LASIX ) 40 MG tablet Take 80 mg by mouth 2 (two) times daily  as needed for fluid or edema.   gabapentin  (NEURONTIN ) 300 MG capsule TAKE 2 CAPSULES(600 MG) BY MOUTH TWICE DAILY   Garlic 100 MG TABS Take 100 mg by mouth daily.   hydrALAZINE  (APRESOLINE ) 25 MG tablet Take 25 mg by mouth 2 (two) times daily.   HYDROcodone -acetaminophen  (NORCO/VICODIN) 5-325 MG tablet Take 1-2 tablets by mouth every 4 (four) hours as needed. (Patient taking differently: Take 1-2 tablets by mouth every 4 (four) hours as needed for moderate pain (pain score 4-6) or severe pain (pain score 7-10).)   insulin  glargine (LANTUS  SOLOSTAR) 100 UNIT/ML Solostar Pen Inject 30 Units into the skin 2 (two) times daily.   magnesium  oxide (MAG-OX) 400 MG tablet Take 400 mg by mouth 2 (two) times daily.   methocarbamol  (ROBAXIN ) 500 MG tablet Take 500 mg by mouth every 8 (eight) hours as needed.   multivitamin-lutein (OCUVITE-LUTEIN) CAPS capsule Take 1 capsule by mouth daily.   mycophenolate  (MYFORTIC ) 180 MG EC tablet Take 360 mg by mouth 2 (two) times daily.   ondansetron  (ZOFRAN ) 8 MG tablet Take 8 mg by mouth every 8 (eight) hours as needed for nausea, vomiting or refractory nausea / vomiting.   pantoprazole  (PROTONIX ) 40 MG tablet Take 1 tablet (40 mg total) by mouth 2 (two) times daily.   predniSONE  (DELTASONE ) 5  MG tablet Take 5 mg by mouth daily.    rosuvastatin  (CRESTOR ) 5 MG tablet Take 5 mg by mouth at bedtime.   tacrolimus  (PROGRAF ) 1 MG capsule Take 2-3 mg by mouth 2 (two) times daily. Take three capsules in the morning. Take two capsules in the evening.   promethazine  (PHENERGAN ) 12.5 MG tablet Take 1 tablet (12.5 mg total) by mouth every 8 (eight) hours as needed for nausea. (Patient not taking: Reported on 10/24/2023)   No current facility-administered medications for this visit. (Other)   REVIEW OF SYSTEMS: ROS   Positive for: Endocrine, Eyes, Allergic/Imm Negative for: Constitutional, Gastrointestinal, Neurological, Skin, Genitourinary, Musculoskeletal, HENT, Cardiovascular,  Respiratory, Psychiatric, Heme/Lymph Last edited by Anthony Bateman, COT on 10/30/2023  1:18 PM.       ALLERGIES Allergies  Allergen Reactions   Docetaxel  Nausea Only    Diaphoretic.  Patient had hypersensitivity reaction to docetaxel .  See progress note from 05/24/2021.  Patient able to complete infusion.   Latex Hives   Norvasc  [Amlodipine ] Swelling   Tape Other (See Comments)    Burns skin.  Please use paper tape only   PAST MEDICAL HISTORY Past Medical History:  Diagnosis Date   Allergy    pollen   Anemia    Anxiety    BRCA2 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 03/01/2021   Breast cancer [facility])    Cataract    surgical repair bilateral   Complication of anesthesia    not herself for 2 days after last surgery (brest lumpectomy)   Coronary artery disease    Depression    ESRD on hemodialysis (HCC)    Home HD 5x per week- not on dialysis now had tramsplant 4/17   Family history of ovarian cancer 02/22/2021   GERD (gastroesophageal reflux disease)    Hearing loss 2017   right ear   History of blood transfusion    transfusion reaction   Hyperlipidemia    Hypertension    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 per pt-dr levy also II; Per patient 08/18/21 she is Type 2   Kidney transplant recipient    Leukocytosis 06/13/2021   Neuromuscular disorder (HCC)    NEUROPATHY   Sleep apnea    Past Surgical History:  Procedure Laterality Date   ABDOMINAL HYSTERECTOMY     BREAST EXCISIONAL BIOPSY Right 08/2015   BREAST LUMPECTOMY WITH RADIOACTIVE SEED LOCALIZATION Right 09/14/2015   Procedure: RIGHT BREAST LUMPECTOMY WITH RADIOACTIVE SEED LOCALIZATION;  Surgeon: Dareen Ebbing, MD;  Location: [facility];  Service: General;  Laterality: Right;   BREAST LUMPECTOMY WITH RADIOACTIVE SEED LOCALIZATION Left 03/02/2021   Procedure: LEFT BREAST SEED LUMPECTOMY LEFT SENTINEL LYMPH NODE MAPPING;  Surgeon: Sim Dryer,  MD;  Location: [facility];  Service: General;  Laterality: Left;  GEN AND PEC BLOCK   BREAST SURGERY Bilateral    biopsy bilateral   CARDIAC CATHETERIZATION     CATARACT EXTRACTION Bilateral    bilateral   CHOLECYSTECTOMY     CYST REMOVAL NECK     DIALYSIS FISTULA CREATION Left    EYE SURGERY Bilateral    lazer   kidney transplant     LEFT HEART CATH AND CORONARY ANGIOGRAPHY N/A 03/04/2019   Procedure: LEFT HEART CATH AND CORONARY ANGIOGRAPHY;  Surgeon: Swaziland, Peter M, MD;  Location: [facility]AS[facility];  Service: Cardiovascular;  Laterality: N/A;   LEFT HEART CATHETERIZATION WITH CORONARY ANGIOGRAM N/A 03/30/2014  Procedure: LEFT HEART CATHETERIZATION WITH CORONARY ANGIOGRAM;  Surgeon: Mickiel Albany, MD;  Location: [facility] CATH LAB;  Service: Cardiovascular;  Laterality: N/A;   LIPOMA EXCISION N/A 02/06/2017   Procedure: EXCISION POSTERIOR NECK SEBACEOUS CYST;  Surgeon: Oza Blumenthal, MD;  Location: [facility] OR;  Service: General;  Laterality: N/A;   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   Procedure: REPAIR OF ARTERIOVENOUS FISTULA ANEURYSM;  Surgeon: Margherita Shell, MD;  Location: [facility] OR;  Service: Vascular;  Laterality: Left;   REVISON OF ARTERIOVENOUS FISTULA Left 09/28/2013   Procedure: EXCISE ESCHAR LEFT ARM  ARTERIOVENOUS FISTULA WITH PLICATION OF LEFT ARM ARTERIOVENOUS FISTULA;  Surgeon: Richrd Char, MD;  [facility];  Service: Vascular;  Laterality: Left;   REVISON OF ARTERIOVENOUS FISTULA Left 08/26/2015   Procedure: RESECTION ANEURYSM OF LEFT ARM ARTERIOVENOUS FISTULA  ;  Surgeon: Margherita Shell, MD;  Location: [facility] OR;  Service: Vascular;  Laterality: Left;   ROBOTIC ASSISTED BILATERAL SALPINGO OOPHERECTOMY N/A 07/11/2022   Procedure: XI ROBOTIC ASSISTED BILATERAL SALPINGO OOPHORECTOMY;  Surgeon: Suzi Essex, MD;  Location: [facility] ORS;  Service: Gynecology;  Laterality: N/A;   SIMPLE MASTECTOMY WITH AXILLARY SENTINEL NODE BIOPSY  Bilateral 08/22/2021   Procedure: BILATERAL SIMPLE MASTECTOMY;  Surgeon: Sim Dryer, MD;  Location: [facility] OR;  Service: General;  Laterality: Bilateral;   TUBAL LIGATION     FAMILY HISTORY Family History  Problem Relation Age of Onset   Diabetes Mother    Kidney disease Mother    Diabetes Father    Heart disease Father    Kidney disease Father    Hypertension Father    Diabetes Sister    Asthma Sister    Lupus Sister    Diabetes Brother    Diabetes Brother    Diabetes Brother    Diabetes Brother    Cancer Maternal Grandmother 50       ovarian cancer   Ovarian cancer Maternal Grandmother    Diabetes Maternal Grandfather    Diabetes Paternal Grandmother    Colon cancer Neg Hx    Colon polyps Neg Hx    Esophageal cancer Neg Hx    Rectal cancer Neg Hx    Stomach cancer Neg Hx    Breast cancer Neg Hx    Endometrial cancer Neg Hx    Pancreatic cancer Neg Hx    Prostate cancer Neg Hx    Inflammatory bowel disease Neg Hx    Liver disease Neg Hx    SOCIAL HISTORY Social History   Tobacco Use   Smoking status: Never   Smokeless tobacco: Never  Vaping Use   Vaping status: Never Used  Substance Use Topics   Alcohol use: Not Currently    Comment: Socially   Drug use: No       OPHTHALMIC EXAM:  Base Eye Exam     Visual Acuity (Snellen - Linear)       Right Left   Dist [place] 20/30 20/30 -2   Dist ph [place] NI 20/30         Tonometry (Tonopen, 1:34 PM)       Right Left   Pressure 17 19         Visual Fields (Counting fingers)       Left Right    Full Full         Extraocular Movement       Right Left    Full, Ortho Full, Ortho  Neuro/Psych     Oriented x3: Yes   Mood/Affect: Normal         Dilation     Both eyes: 1.0% Mydriacyl, 2.5% Phenylephrine  @ 1:34 PM           Slit Lamp and Fundus Exam     Slit Lamp Exam       Right Left   Lids/Lashes Dermatochalasis - upper lid Dermatochalasis - upper lid   Conjunctiva/Sclera  nasal and temporal pinguecula nasal and temporal pinguecula   Cornea trace PEE, early band K nasal and temporal trace PEE, early band K nasal and temporal   Anterior Chamber deep and clear deep and clear   Iris Round and moderately dilated to 4.0, No NVI Round and moderately dilated to 4.75, No NVI   Lens Posterior chamber intraocular lens, 1+ Posterior capsular opacification Posterior chamber intraocular lens, 1+ Posterior capsular opacification   Anterior Vitreous Vitreous syneresis mild syneresis, vitreous condensations/fibrosis         Fundus Exam       Right Left   Disc trace Pallor, +PPP, no NVD, Sharp rim Trace Pallor, +fibrosis, no NVD, +PPP IT   C/D Ratio 0.3 0.4   Macula Flat, Blunted foveal reflex, rare MA, focal laser scars temporally Flat, Blunted foveal reflex, rare MA, focal laser scars temporally   Vessels Severe attenuation, +sclerosis / sheathing, Tortuous, Copper wiring Severe attenuation, Copper wiring, +perivascular sheathing, Tortuous, AV crossing changes, regressed NVD   Periphery Attached, dense 360 PRP, No heme Attached, 360 PRP, room for fill in if needed, No heme           Refraction     Wearing Rx       Sphere Cylinder Axis   Right -0.50 +2.25 084   Left [place] +2.00 093         Manifest Refraction       Sphere Cylinder Axis Dist VA   Right -0.50 +2.00 096 20/30   Left [place] +1.50 103 20/25           IMAGING AND PROCEDURES  Imaging and Procedures for 10/30/2023  OCT, Retina - OU - Both Eyes        Right Eye Quality was good. Central Foveal Thickness: 251. Progression has been stable. Findings include normal foveal contour, no IRF, no SRF, outer retinal atrophy (No DME; +ORA corresponding to laser changes -- stable).   Left Eye Quality was good. Central Foveal Thickness: 231. Progression has been stable. Findings include normal foveal contour, no IRF, no SRF, outer retinal atrophy (No DME; patches of ORA corresponding to laser changes,  +ERM).   Notes  *Images captured and stored on drive  Diagnosis / Impression:  OD: No DME; +ORA corresponding to laser changes OS: No DME; patches of ORA corresponding to laser changes, +ERM  Clinical management:  See below  Abbreviations: NFP - Normal foveal profile. CME - cystoid macular edema. PED - pigment epithelial detachment. IRF - intraretinal fluid. SRF - subretinal fluid. EZ - ellipsoid zone. ERM - epiretinal membrane. ORA - outer retinal atrophy. ORT - outer retinal tubulation. SRHM - subretinal hyper-reflective material. IRHM - intraretinal hyper-reflective material      Fluorescein  Angiography Optos (Transit OS)        Right Eye Progression has been stable. Early phase findings include staining, window defect, microaneurysm, vascular perfusion defect. Mid/Late phase findings include staining, window defect, microaneurysm, vascular perfusion defect (No NV, no leakage).   Left Eye Progression has been stable.  Early phase findings include staining, window defect, microaneurysm, vascular perfusion defect. Mid/Late phase findings include staining, window defect, microaneurysm, vascular perfusion defect (No NV, no leakage).   Notes  **Images stored on drive**  Impression: Stable PDR s/p heavy PRP 360 OU - no active NV or leakage            ASSESSMENT/PLAN:    ICD-10-CM   1. Stable proliferative diabetic retinopathy of both eyes associated with type 2 diabetes mellitus (HCC)  E11.3553 OCT, Retina - OU - Both Eyes    Fluorescein  Angiography Optos (Transit OS)    2. Current use of insulin  (HCC)  Z79.4     3. Long-term (current) use of injectable non-insulin  antidiabetic drugs  Z79.85     4. Essential hypertension  I10     5. Hypertensive retinopathy of both eyes  H35.033     6. Pseudophakia, both eyes  Z96.1       1-3. Proliferative diabetic retinopathy w/o DME OU  - A1C 9.0 (04.30.25), 6.4 (01.15.24), pt states it was 12 at one point  - s/p PRP OU  and focal laser OU (?early 2000's - [place], [place]) - exam shows rare MA, dense PRP 360 OU -- stable exam - BCVA stable at 20/25 OD, 20/20 OS - FA (04.03.23) shows mild MA, no NV OU - FA (05.07.25) shows No NV, no leakage - OCT without diabetic macular edema, both eyes  - discussed findings and prognosis - f/u in 1 yr -- DFE/OCT  4,5. Hypertensive retinopathy OU - discussed importance of tight BP control - monitor  6. Pseudophakia OU  - s/p CE/IOL OU  - IOLs in good position, doing well  - monitor  Ophthalmic Meds Ordered this visit:  No orders of the defined types were placed in this encounter.    Return in about 1 year (around 10/29/2024) for f/u PDR OU, DFE, OCT.  There are no Patient Instructions on file for this visit.   Explained the diagnoses, plan, and follow up with the patient and they expressed understanding.  Patient expressed understanding of the importance of proper follow up care.   This document serves as a record of services personally performed by Jeanice Millard, MD, PhD. It was created on their behalf by Angelia Kelp, an ophthalmic technician. The creation of this record is the provider's dictation and/or activities during the visit.    Electronically signed by: Angelia Kelp, OA, 11/01/23  1:59 AM  Jeanice Millard, M.D., Ph.D. Diseases & Surgery of the Retina and Vitreous [facility]  I have reviewed the above documentation for accuracy and completeness, and I agree with the above. Jeanice Millard, M.D., Ph.D. 11/01/23 2:00 AM   Abbreviations: M myopia (nearsighted); A astigmatism; H hyperopia (farsighted); P presbyopia; Mrx spectacle prescription;  CTL contact lenses; OD right eye; OS left eye; OU both eyes  XT exotropia; ET esotropia; PEK punctate epithelial keratitis; PEE punctate epithelial erosions; DES dry eye syndrome; MGD meibomian gland dysfunction; ATs artificial tears; PFAT's preservative free artificial tears; NSC  nuclear sclerotic cataract; PSC posterior subcapsular cataract; ERM epi-retinal membrane; PVD posterior vitreous detachment; RD retinal detachment; DM diabetes mellitus; DR diabetic retinopathy; NPDR non-proliferative diabetic retinopathy; PDR proliferative diabetic retinopathy; CSME clinically significant macular edema; DME diabetic macular edema; dbh dot blot hemorrhages; CWS cotton wool spot; POAG primary open angle glaucoma; C/D cup-to-disc ratio; HVF humphrey visual field; GVF goldmann visual field; OCT optical coherence tomography; IOP intraocular pressure; BRVO Branch  retinal vein occlusion; CRVO central retinal vein occlusion; CRAO central retinal artery occlusion; BRAO branch retinal artery occlusion; RT retinal tear; SB scleral buckle; PPV pars plana vitrectomy; VH Vitreous hemorrhage; PRP panretinal laser photocoagulation; IVK intravitreal kenalog; VMT vitreomacular traction; MH Macular hole;  NVD neovascularization of the disc; NVE neovascularization elsewhere; AREDS age related eye disease study; ARMD age related macular degeneration; POAG primary open angle glaucoma; EBMD epithelial/anterior basement membrane dystrophy; ACIOL anterior chamber intraocular lens; IOL intraocular lens; PCIOL posterior chamber intraocular lens; Phaco/IOL phacoemulsification with intraocular lens placement; PRK photorefractive keratectomy; LASIK laser assisted in situ keratomileusis; HTN hypertension; DM diabetes mellitus; COPD chronic obstructive pulmonary disease

## 2023-10-24 ENCOUNTER — Ambulatory Visit (HOSPITAL_COMMUNITY): Admission: RE | Admit: 2023-10-24 | Source: Ambulatory Visit

## 2023-10-24 DIAGNOSIS — N189 Chronic kidney disease, unspecified: Secondary | ICD-10-CM | POA: Diagnosis not present

## 2023-10-24 DIAGNOSIS — N179 Acute kidney failure, unspecified: Secondary | ICD-10-CM | POA: Diagnosis not present

## 2023-10-24 LAB — COMPREHENSIVE METABOLIC PANEL WITH GFR
ALT: 23 U/L (ref 0–44)
AST: 21 U/L (ref 15–41)
Albumin: 3.2 g/dL — ABNORMAL LOW (ref 3.5–5.0)
Alkaline Phosphatase: 50 U/L (ref 38–126)
Anion gap: 10 (ref 5–15)
BUN: 49 mg/dL — ABNORMAL HIGH (ref 6–20)
CO2: 22 mmol/L (ref 22–32)
Calcium: 9.4 mg/dL (ref 8.9–10.3)
Chloride: 107 mmol/L (ref 98–111)
Creatinine, Ser: 3.24 mg/dL — ABNORMAL HIGH (ref 0.44–1.00)
GFR, Estimated: 16 mL/min — ABNORMAL LOW (ref >60)
Glucose, Bld: 174 mg/dL — ABNORMAL HIGH (ref 70–99)
Potassium: 4.4 mmol/L (ref 3.5–5.1)
Sodium: 139 mmol/L (ref 135–145)
Total Bilirubin: 0.7 mg/dL (ref 0.0–1.2)
Total Protein: 5.1 g/dL — ABNORMAL LOW (ref 6.5–8.1)

## 2023-10-24 LAB — CBC
HCT: 30.6 % — ABNORMAL LOW (ref 36.0–46.0)
Hemoglobin: 10 g/dL — ABNORMAL LOW (ref 12.0–15.0)
MCH: 29.2 pg (ref 26.0–34.0)
MCHC: 32.7 g/dL (ref 30.0–36.0)
MCV: 89.5 fL (ref 80.0–100.0)
Platelets: 146 10*3/uL — ABNORMAL LOW (ref 150–400)
RBC: 3.42 MIL/uL — ABNORMAL LOW (ref 3.87–5.11)
RDW: 11.9 % (ref 11.5–15.5)
WBC: 4 10*3/uL (ref 4.0–10.5)
nRBC: 0 % (ref 0.0–0.2)

## 2023-10-24 LAB — GLUCOSE, CAPILLARY
Glucose-Capillary: 107 mg/dL — ABNORMAL HIGH (ref 70–99)
Glucose-Capillary: 142 mg/dL — ABNORMAL HIGH (ref 70–99)
Glucose-Capillary: 145 mg/dL — ABNORMAL HIGH (ref 70–99)
Glucose-Capillary: 201 mg/dL — ABNORMAL HIGH (ref 70–99)

## 2023-10-24 MED ORDER — VITAMIN B-12 1000 MCG PO TABS
1000.0000 ug | ORAL_TABLET | Freq: Every day | ORAL | Status: DC
  Administered 2023-10-24 – 2023-10-25 (×2): 1000 ug via ORAL
  Filled 2023-10-24 (×2): qty 1

## 2023-10-24 MED ORDER — METHOCARBAMOL 500 MG PO TABS: 500.0000 mg | ORAL_TABLET | Freq: Three times a day (TID) | ORAL | Status: DC | PRN

## 2023-10-24 MED ORDER — CARVEDILOL 25 MG PO TABS
25.0000 mg | ORAL_TABLET | Freq: Two times a day (BID) | ORAL | Status: DC
  Administered 2023-10-24 – 2023-10-25 (×2): 25 mg via ORAL
  Filled 2023-10-24 (×2): qty 1

## 2023-10-24 MED ORDER — ASPIRIN 81 MG PO TBEC
81.0000 mg | DELAYED_RELEASE_TABLET | Freq: Every day | ORAL | Status: DC
  Administered 2023-10-24 – 2023-10-25 (×2): 81 mg via ORAL
  Filled 2023-10-24 (×2): qty 1

## 2023-10-24 MED ORDER — GABAPENTIN 300 MG PO CAPS
600.0000 mg | ORAL_CAPSULE | Freq: Two times a day (BID) | ORAL | Status: DC
  Administered 2023-10-24 – 2023-10-25 (×3): 600 mg via ORAL
  Filled 2023-10-24 (×3): qty 2

## 2023-10-24 MED ORDER — HYDROCODONE-ACETAMINOPHEN 5-325 MG PO TABS: 1.0000 | ORAL_TABLET | ORAL | Status: DC | PRN

## 2023-10-24 MED ORDER — ESCITALOPRAM OXALATE 10 MG PO TABS
10.0000 mg | ORAL_TABLET | Freq: Every day | ORAL | Status: DC
  Administered 2023-10-24 – 2023-10-25 (×2): 10 mg via ORAL
  Filled 2023-10-24 (×2): qty 1

## 2023-10-24 MED ORDER — VITAMIN D 25 MCG (1000 UNIT) PO TABS
1000.0000 [IU] | ORAL_TABLET | Freq: Every day | ORAL | Status: DC
  Administered 2023-10-24 – 2023-10-25 (×2): 1000 [IU] via ORAL
  Filled 2023-10-24 (×2): qty 1

## 2023-10-24 NOTE — Progress Notes (Signed)
 Progress Note   Patient: Cynthia [name] ZOX:[id number] DOB: [birth year] DOA: 10/23/2023     1 DOS: the patient was seen and examined on 10/24/2023   Brief hospital course: 53 y.o. female with medical history significant of CKD stage IIIb, s/p renal transplant, breast cancer s/p chemo and bilateral mastectomyh elevated creatinine levels.   She has been experiencing nausea, particularly when eating, for a little over two weeks, leading to a decreased appetite. She has consulted her gastroenterologist regarding these symptoms, who planned to perform an endoscopy and CT scan of her abdomen pelvis to assess her stomach condition. However, during blood work, her elevated creatinine levels were noted to be elevated up to 4.37 with BUN 57.  She was sent to the hospital for further evaluation.  Review of labs note lipase 32 and LFTs also within normal limits.   No recent symptoms of vomiting or diarrhea, except for the nausea associated with eating. She also denies any abdominal pain, attributing her stomach discomfort to hunger currently.     In  the emergency department patient was noted to be afebrile with stable vital signs.  Labs noted BUN 59 and creatinine 4.26.  Baseline creatinine previously noted to be around 1.7-2.3.  Nephrology was consulted with recommendations to check renal ultrasound and aggressive IV fluid rehydration.  Assessment and Plan: Acute kidney injury superimposed on chronic kidney disease S/p renal transplant Patient presents with creatinine elevated up to 4.26 with BUN 59.  Notes poor p.o. intake for the last 2 weeks due to persistent nausea.  Baseline creatinine previously noted to range from 1.7-2.3.   -Nephrology consulted.  Patient was given 1 L bolus of IV fluids.   -Renal transplant ultrasound with Doppler noted normal ultrasound appearance of the right lower quadrant kidney transplant with normal renal artery velocities. - Continue prednisone , tacrolimus , and  mycophenolate  - Cr improved this AM   Uncontrolled diabetes mellitus type 2 with hypoglycemia, with long-term use of insulin  - Hypoglycemic initially. Continue hypoglycemia protocol - A1c 9.0 - holding home long-acting insulin  - Continue CBGs before every meal with sensitive SSI -Pt noted to be on tirzepatide PTA. Recommend holding this regimen given known side effects   Essential hypertension Blood pressure suboptimally controlled - cont home bp regimen - cont PRN hydralazine  as needed   History of breast cancer S/p initial lumpectomy and chemotherapy in 2022.  BRCA 2 gene patient positive patient had subsequent double mastectomy with Dr. Afton Horse in 07/2021.   Hyperlipidemia - Continue Crestor       Subjective: Feeling better today  Physical Exam: Vitals:   10/24/23 0651 10/24/23 0734 10/24/23 1625 10/24/23 1626  BP: (!) 149/65 (!) 143/61  (!) 174/61  Pulse: 73 74  81  Resp: 20     Temp: 98.1 F (36.7 C) 98.2 F (36.8 C) 98.3 F (36.8 C) 98.3 F (36.8 C)  TempSrc: Oral Oral Oral Oral  SpO2: 100% 100%  98%  Weight:      Height:       General exam: Awake, laying in bed, in nad Respiratory system: Normal respiratory effort, no wheezing Cardiovascular system: regular rate, s1, s2 Gastrointestinal system: Soft, nondistended, positive BS Central nervous system: CN2-12 grossly intact, strength intact Extremities: Perfused, no clubbing Skin: Normal skin turgor, no notable skin lesions seen Psychiatry: Mood normal // no visual hallucinations   Data Reviewed:  Labs reviewed: Na 139, K 4.4, Cr 3.24, WBC 4.0, Hgb 10.0, Plts 146  Family Communication: Pt in room, family not at bedside  Disposition: Status is: Inpatient Remains inpatient appropriate because: severity of illness  Planned Discharge Destination: Home    Author: Cherylle Corwin, MD 10/24/2023 4:46 PM  For on call review [email or web address].

## 2023-10-24 NOTE — TOC CM/SW Note (Addendum)
 Transition of Care [facility]) - Inpatient Brief Assessment   Patient Details  Name: Cynthia [name] MRN: [id number] Date of Birth: Nov 13, 1970  Transition of [facility]) CM/SW Contact:    Tom-Johnson, Angelique Ken, RN Phone Number: 10/24/2023, 11:07 AM   Clinical Narrative:  Patient presented to the ED from her PCP with Creatinine of  4.3, Abdominal discomfort and Poor Appetite. Patient had Kidney Transplant in 2017 and followed at [facility]. Creatinine today at 3.24. Nephrology following. Patient is on Prograf , states she is compliant with her regimen.    Patient has hx of Breast Cancer s/p Chemo with Bilateral Mastectomy.  Per Nephrology, If there is no improvement in Renal function, patient will need to be transferred to [facility] with Dr. Noretta Bears for a Renal Biopsy to r/o Rejection.   Patient is from home with her daughter and significant other. Has three supportive children. Not employed, on disability. Does not have DME's at home.  PCP is Nguyen, Kim, NP and uses [employer] in [place].   No TOC needs or recommendations noted at this time.  Patient not Medically ready for discharge.  CM will continue to follow as patient progresses with care towards discharge.        Transition of Care Asessment: Insurance and Status: Insurance coverage has been reviewed Patient has primary care physician: Yes Home environment has been reviewed: Yes Prior level of function:: Independent Prior/Current Home Services: No current home services Social Drivers of Health Review: SDOH reviewed no interventions necessary Readmission risk has been reviewed: Yes Transition of care needs: no transition of care needs at this time

## 2023-10-24 NOTE — Progress Notes (Signed)
 Cynthia [name] is an 53 y.o. 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  Creatinine in July 25, 2023 was 2.2, UPC in early January 2025 was only 48 mg/g.  Cr has been rising over the past 6 months with creatinine at 1.9 on April 17, 2023, 2.27 October 03, 2023 and 4.37 on October 22, 2023.  2 wk h/o intermittent abdominal discomfort with dysuria as well but denies any fever chills, nausea, vomiting.  She has not been eating much solids at all and has only been drinking a little bit.  She has taken immunosuppressives over the past 2 wks.  Assessment/Plan: Acute knee injury on CKD 3B with a baseline creatinine approximately 1.7-2.3  - acute component may be secondary to prerenal azotemia as the patient has not been eating and drinking very much in the past 2 weeks because of intermittent abdominal discomfort as well as depression. -Transplant renal ultrasound -> no obstruction and vasculature fine as well. -Urinalysis not c/w UTI.   -Continue immunosuppressives at home doses and check a tacrolimus  level trough -Hydrate aggressively with isotonic fluid resuscitation; continue hold lasix . Should be able to stop the fluids after liter is completed -Fortunately she is showing improvement in renal function and will hold on transfer to [facility] with Dr. Noretta Bears for a renal biopsy to rule out rejection.    -Monitor Daily I/Os, Daily weight  -Maintain MAP>65 for optimal renal perfusion.  - Avoid nephrotoxic agents such as IV contrast, NSAIDs, and phosphate containing bowel preps (FLEETS)   Abdominal pain -u/a consistent with UTI.  CT scan has not been done yet.   Hypertension -does not tolerate amlodipine  which caused severe lower extremity edema Depression -she states that  this has been worse recently but denies any suicidal or homicidal ideation. H/o breast cancer followed by Dr. Sonja Leesburg  Subjective: She is feeling a little bit better, still little dizziness when walking but that is improved as well.  She ate her entire dinner without any abdominal discomfort.  Denies any fever chills shortness of breath.   Chemistry and CBC: Creatinine  Date/Time Value Ref Range Status  04/17/2023 10:44 AM 1.89 (H) 0.44 - 1.00 mg/dL Final  09/81/1914 78:29 AM 1.47 (H) 0.44 - 1.00 mg/dL Final  56/21/3086 57:84 AM 1.12 (H) 0.44 - 1.00 mg/dL Final  69/62/9528 41:32 AM 2.13 (H) 0.44 - 1.00 mg/dL Final  44/06/270 53:66 AM 1.34 (H) 0.44 - 1.00 mg/dL Final  44/08/4740 59:56 AM 1.37 (H) 0.44 - 1.00 mg/dL Final  38/75/6433 29:51 AM 1.57 (H) 0.44 - 1.00 mg/dL Final  88/41/6606 30:16 AM 1.66 (H) 0.44 - 1.00 mg/dL Final  07/03/3233 57:32 AM 1.29 (H) 0.44 - 1.00 mg/dL Final  20/25/4270 62:37 AM 1.16 (H) 0.44 - 1.00 mg/dL Final  62/83/1517 61:60 PM 1.25 (H) 0.44 - 1.00 mg/dL Final  73/71/0626 94:85 AM 1.24 (H) 0.44 - 1.00 mg/dL Final  46/27/0350 09:38 AM 1.27 (H) 0.44 - 1.00 mg/dL Final   Creatinine, Ser  Date/Time Value Ref Range Status  10/24/2023 05:17 AM 3.24 (H) 0.44 - 1.00 mg/dL Final  18/29/9371 69:67 PM 4.26 (H) 0.44 - 1.00 mg/dL Final  89/38/1017 51:02 AM 4.37 (H) 0.40 - 1.20 mg/dL Final  58/52/7782 42:35 PM 2.27 (H) 0.44 - 1.00 mg/dL Final  36/14/4315 40:08  AM 1.72 (H) 0.44 - 1.00 mg/dL Final  56/21/3086 57:84 PM 2.07 (H) 0.44 - 1.00 mg/dL Final  69/62/9528 41:32 AM 1.59 (H) 0.44 - 1.00 mg/dL Final  44/06/270 53:66 AM 2.09 (H) 0.44 - 1.00 mg/dL Final  44/08/4740 59:56 AM 2.30 (H) 0.44 - 1.00 mg/dL Final  38/75/6433 29:51 PM 2.60 (H) 0.44 - 1.00 mg/dL Final  88/41/6606 30:16 AM 2.59 (H) 0.44 - 1.00 mg/dL Final  07/03/3233 57:32 AM 0.99 0.44 - 1.00 mg/dL Final  20/25/4270 62:37 AM 1.08 (H) 0.44 - 1.00 mg/dL Final  62/83/1517 61:60 AM 1.07 (H) 0.44 - 1.00 mg/dL  Final  73/71/0626 94:85 PM 1.17 (H) 0.44 - 1.00 mg/dL Final  46/27/0350 09:38 AM 1.26 (H) 0.44 - 1.00 mg/dL Final  18/29/9371 69:67 AM 1.28 (H) 0.44 - 1.00 mg/dL Final  89/38/1017 51:02 AM 1.36 (H) 0.44 - 1.00 mg/dL Final  58/52/7782 42:35 AM 1.61 (H) 0.44 - 1.00 mg/dL Final  36/14/4315 40:08 PM 1.21 (H) 0.44 - 1.00 mg/dL Final  67/61/9509 32:67 PM 1.24 (H) 0.44 - 1.00 mg/dL Final  12/45/8099 83:38 AM 1.58 (H) 0.44 - 1.00 mg/dL Final  25/10/3974 73:41 AM 0.90 0.44 - 1.00 mg/dL Final  93/79/0240 97:35 AM 0.93 0.44 - 1.00 mg/dL Final  32/99/2426 83:41 AM 0.84 0.44 - 1.00 mg/dL Final  96/22/2979 89:21 AM 0.77 0.44 - 1.00 mg/dL Final  19/41/7408 14:48 AM 1.18 (H) 0.44 - 1.00 mg/dL Final  18/56/3149 70:26 AM 1.30 (H) 0.44 - 1.00 mg/dL Final  37/85/8850 27:74 PM 1.51 (H) 0.44 - 1.00 mg/dL Final  12/87/8676 72:09 AM 1.11 (H) 0.57 - 1.00 mg/dL Final  47/02/6282 66:29 AM 1.27 (H) 0.44 - 1.00 mg/dL Final  47/65/4650 35:46 AM 2.40 (H) 0.44 - 1.00 mg/dL Final  56/81/2751 70:01 AM 1.59 (H) 0.44 - 1.00 mg/dL Final  74/94/4967 59:16 PM 1.18 (H) 0.44 - 1.00 mg/dL Final    Comment:    DELTA CHECK NOTED DIALYSIS   11/18/2015 03:02 PM 2.95 (H) 0.44 - 1.00 mg/dL Final  38/46/6599 35:70 AM 3.20 (H) 0.44 - 1.00 mg/dL Final  17/79/3903 00:92 PM 3.59 (H) 0.44 - 1.00 mg/dL Final  33/00/7622 63:33 AM 5.20 (H) 0.44 - 1.00 mg/dL Final  54/56/2563 89:37 AM 4.39 (H) 0.44 - 1.00 mg/dL Final  34/28/7681 15:72 AM 4.69 (H) 0.44 - 1.00 mg/dL Final  62/08/5595 41:63 AM 4.17 (H) 0.44 - 1.00 mg/dL Final  84/53/6468 03:21 PM 8.30 (H) 0.44 - 1.00 mg/dL Final  22/48/2500 37:04 AM 11.08 (H) 0.44 - 1.00 mg/dL Final  88/89/1694 50:38 PM 9.39 (H) 0.44 - 1.00 mg/dL Final  88/28/0034 91:79 AM 10.47 (H) 0.44 - 1.00 mg/dL Final  15/10/6977 48:01 AM 8.10 (H) 0.44 - 1.00 mg/dL Final  65/53/7482 70:78 AM 9.93 (H) 0.50 - 1.10 mg/dL Final  67/54/4920 10:07 AM 6.88 (H) 0.50 - 1.10 mg/dL Final    Comment:    DELTA CHECK NOTED   03/29/2014 05:27 PM 4.37 (H) 0.50 - 1.10 mg/dL Final    Comment:    DELTA CHECK NOTED DIALYSIS  03/29/2014 06:16 AM 9.35 (H) 0.50 - 1.10 mg/dL Final  06/13/7587 32:54 AM 8.38 (H) 0.50 - 1.10 mg/dL Final  98/26/4158 30:94 AM 7.81 (H) 0.50 - 1.10 mg/dL Final   Recent Labs  Lab 10/22/23 1025 10/23/23 1349 10/24/23 0517  NA 138 138 139  K 4.3 4.3 4.4  CL 100 103 107  CO2 30 26 22   GLUCOSE 170* 188* 174*  BUN 57* 59* 49*  CREATININE 4.37*  4.26* 3.24*  CALCIUM  10.3 10.3 9.4   Recent Labs  Lab 10/22/23 1025 10/23/23 1349 10/24/23 0517  WBC 4.8 4.4 4.0  NEUTROABS  --  2.6  --   HGB 12.3 11.5* 10.0*  HCT 35.9* 34.8* 30.6*  MCV 88.3 91.6 89.5  PLT 162.0 168 146*   Liver Function Tests: Recent Labs  Lab 10/22/23 1025 10/23/23 1349 10/24/23 0517  AST 24 27 21   ALT 27 29 23   ALKPHOS 68 56 50  BILITOT 0.6 0.7 0.7  PROT 6.1 6.2* 5.1*  ALBUMIN  4.2 3.9 3.2*   Recent Labs  Lab 10/22/23 1025  LIPASE 32.0  AMYLASE 39   No results for input(s): "AMMONIA" in the last 168 hours. Cardiac Enzymes: No results for input(s): "CKTOTAL", "CKMB", "CKMBINDEX", "TROPONINI" in the last 168 hours. Iron Studies:  Recent Labs    10/22/23 1025  IRON 96  TIBC 254.8  TRANSFERRIN 182.0*  FERRITIN 1,122.8*   PT/INR: @LABRCNTIP (inr:5)  Xrays/Other Studies: ) Results for orders placed or performed during the hospital encounter of 10/23/23 (from the past 48 hours)  Comprehensive metabolic panel     Status: Abnormal   Collection Time: 10/23/23  1:49 PM  Result Value Ref Range   Sodium 138 135 - 145 mmol/L   Potassium 4.3 3.5 - 5.1 mmol/L   Chloride 103 98 - 111 mmol/L   CO2 26 22 - 32 mmol/L   Glucose, Bld 188 (H) 70 - 99 mg/dL    Comment: Glucose reference range applies only to samples taken after fasting for at least 8 hours.   BUN 59 (H) 6 - 20 mg/dL   Creatinine, Ser 7.82 (H) 0.44 - 1.00 mg/dL   Calcium  10.3 8.9 - 10.3 mg/dL   Total Protein 6.2 (L) 6.5 - 8.1 g/dL   Albumin   3.9 3.5 - 5.0 g/dL   AST 27 15 - 41 U/L   ALT 29 0 - 44 U/L   Alkaline Phosphatase 56 38 - 126 U/L   Total Bilirubin 0.7 0.0 - 1.2 mg/dL   GFR, Estimated 12 (L) >60 mL/min    Comment: (NOTE) Calculated using the CKD-EPI Creatinine Equation (2021)    Anion gap 9 5 - 15    Comment: Performed at [facility], [address], [place], [place] [address]  CBC with Differential     Status: Abnormal   Collection Time: 10/23/23  1:49 PM  Result Value Ref Range   WBC 4.4 4.0 - 10.5 K/uL   RBC 3.80 (L) 3.87 - 5.11 MIL/uL   Hemoglobin 11.5 (L) 12.0 - 15.0 g/dL   HCT 30.8 (L) 65.7 - 84.6 %   MCV 91.6 80.0 - 100.0 fL   MCH 30.3 26.0 - 34.0 pg   MCHC 33.0 30.0 - 36.0 g/dL   RDW 96.2 95.2 - 84.1 %   Platelets 168 150 - 400 K/uL   nRBC 0.0 0.0 - 0.2 %   Neutrophils Relative % 60 %   Neutro Abs 2.6 1.7 - 7.7 K/uL   Lymphocytes Relative 28 %   Lymphs Abs 1.2 0.7 - 4.0 K/uL   Monocytes Relative 9 %   Monocytes Absolute 0.4 0.1 - 1.0 K/uL   Eosinophils Relative 2 %   Eosinophils Absolute 0.1 0.0 - 0.5 K/uL   Basophils Relative 1 %   Basophils Absolute 0.1 0.0 - 0.1 K/uL   Immature Granulocytes 0 %   Abs Immature Granulocytes 0.01 0.00 - 0.07 K/uL    Comment: Performed at  [facility], [address], [place], [place] [address]  Hemoglobin A1c     Status: Abnormal   Collection Time: 10/23/23  1:56 PM  Result Value Ref Range   Hgb A1c MFr Bld 9.0 (H) 4.8 - 5.6 %    Comment: (NOTE) Pre diabetes:          5.7%-6.4%  Diabetes:              >6.4%  Glycemic control for   <7.0% adults with diabetes    Mean Plasma Glucose 211.6 mg/dL    Comment: Performed at [facility], [address], [place], [place] [address]  Urinalysis, w/ Reflex to Culture (Infection Suspected) -Urine, Clean Catch     Status: Abnormal   Collection Time: 10/23/23  4:42 PM  Result Value Ref Range   Specimen Source URINE, CLEAN CATCH    Color, Urine YELLOW YELLOW   APPearance CLEAR CLEAR   Specific  Gravity, Urine 1.005 1.005 - 1.030   pH 7.0 5.0 - 8.0   Glucose, UA NEGATIVE NEGATIVE mg/dL   Hgb urine dipstick NEGATIVE NEGATIVE   Bilirubin Urine NEGATIVE NEGATIVE   Ketones, ur NEGATIVE NEGATIVE mg/dL   Protein, ur NEGATIVE NEGATIVE mg/dL   Nitrite NEGATIVE NEGATIVE   Leukocytes,Ua NEGATIVE NEGATIVE   RBC / HPF 0-5 0 - 5 RBC/hpf   WBC, UA 0-5 0 - 5 WBC/hpf    Comment:        Reflex urine culture not performed if WBC <=10, OR if Squamous epithelial cells >5. If Squamous epithelial cells >5 suggest recollection.    Bacteria, UA RARE (A) NONE SEEN   Squamous Epithelial / HPF 0-5 0 - 5 /HPF    Comment: Performed at [facility], [address], [place], [place] [address]  HIV Antibody (routine testing w rflx)     Status: None   Collection Time: 10/23/23  5:40 PM  Result Value Ref Range   HIV Screen 4th Generation wRfx Non Reactive Non Reactive    Comment: Performed at [facility], [address], [place], [place] [address]  CBG monitoring, ED     Status: Abnormal   Collection Time: 10/23/23  6:58 PM  Result Value Ref Range   Glucose-Capillary 63 (L) 70 - 99 mg/dL    Comment: Glucose reference range applies only to samples taken after fasting for at least 8 hours.  CBG monitoring, ED     Status: Abnormal   Collection Time: 10/23/23  7:15 PM  Result Value Ref Range   Glucose-Capillary 62 (L) 70 - 99 mg/dL    Comment: Glucose reference range applies only to samples taken after fasting for at least 8 hours.  Glucose, capillary     Status: Abnormal   Collection Time: 10/23/23  7:50 PM  Result Value Ref Range   Glucose-Capillary 104 (H) 70 - 99 mg/dL    Comment: Glucose reference range applies only to samples taken after fasting for at least 8 hours.  Glucose, capillary     Status: Abnormal   Collection Time: 10/23/23  8:19 PM  Result Value Ref Range   Glucose-Capillary 121 (H) 70 - 99 mg/dL    Comment: Glucose reference range applies only to samples taken after  fasting for at least 8 hours.  CBC     Status: Abnormal   Collection Time: 10/24/23  5:17 AM  Result Value Ref Range   WBC 4.0 4.0 - 10.5 K/uL   RBC  3.42 (L) 3.87 - 5.11 MIL/uL   Hemoglobin 10.0 (L) 12.0 - 15.0 g/dL   HCT 87.5 (L) 64.3 - 32.9 %   MCV 89.5 80.0 - 100.0 fL   MCH 29.2 26.0 - 34.0 pg   MCHC 32.7 30.0 - 36.0 g/dL   RDW 51.8 84.1 - 66.0 %   Platelets 146 (L) 150 - 400 K/uL   nRBC 0.0 0.0 - 0.2 %    Comment: Performed at [facility], [address], [place], [place] [address]  Comprehensive metabolic panel     Status: Abnormal   Collection Time: 10/24/23  5:17 AM  Result Value Ref Range   Sodium 139 135 - 145 mmol/L   Potassium 4.4 3.5 - 5.1 mmol/L   Chloride 107 98 - 111 mmol/L   CO2 22 22 - 32 mmol/L   Glucose, Bld 174 (H) 70 - 99 mg/dL    Comment: Glucose reference range applies only to samples taken after fasting for at least 8 hours.   BUN 49 (H) 6 - 20 mg/dL   Creatinine, Ser 0.10 (H) 0.44 - 1.00 mg/dL   Calcium  9.4 8.9 - 10.3 mg/dL   Total Protein 5.1 (L) 6.5 - 8.1 g/dL   Albumin  3.2 (L) 3.5 - 5.0 g/dL   AST 21 15 - 41 U/L   ALT 23 0 - 44 U/L   Alkaline Phosphatase 50 38 - 126 U/L   Total Bilirubin 0.7 0.0 - 1.2 mg/dL   GFR, Estimated 16 (L) >60 mL/min    Comment: (NOTE) Calculated using the CKD-EPI Creatinine Equation (2021)    Anion gap 10 5 - 15    Comment: Performed at [facility], [address], [place], [place] [address]   CT ABDOMEN PELVIS WO CONTRAST Result Date: 10/23/2023 CLINICAL DATA:  Acute nonlocalized abdominal pain. EXAM: CT ABDOMEN AND PELVIS WITHOUT CONTRAST TECHNIQUE: Multidetector CT imaging of the abdomen and pelvis was performed following the standard protocol without IV contrast. RADIATION DOSE REDUCTION: This exam was performed according to the departmental dose-optimization program which includes automated exposure control, adjustment of the mA and/or kV according to patient size and/or use of iterative reconstruction  technique. COMPARISON:  Renal transplant ultrasound 10/23/2023 and CT abdomen pelvis 06/01/2019 FINDINGS: Lower chest: No acute abnormality. Hepatobiliary: Cholecystectomy. Unremarkable noncontrast appearance of the liver. No biliary dilation. Pancreas: Unremarkable. Spleen: Unremarkable. Adrenals/Urinary Tract: Normal adrenal glands. Atrophic native kidneys. Right lower quadrant renal transplant. No urinary calculi or hydronephrosis. Unremarkable bladder. Stomach/Bowel: Normal caliber large and small bowel. Moderate colonic stool burden. Colonic diverticulosis without diverticulitis. Stomach and appendix are within normal limits. Vascular/Lymphatic: Aortic atherosclerosis. No enlarged abdominal or pelvic lymph nodes. Reproductive: Hysterectomy.  No adnexal mass. Other: No free intraperitoneal fluid or air. Nodularity in the subcutaneous fat in the anterior right abdominal wall and along the right anterior abdominal musculature (series 3/image [id number]) is unchanged from 06/01/2019. Musculoskeletal: No acute fracture. IMPRESSION: 1. No acute abnormality in the abdomen or pelvis.  Normal appendix. 2. Colonic diverticulosis without diverticulitis. 3. Right lower quadrant renal transplant. 4. Aortic Atherosclerosis (ICD10-I70.0). Electronically Signed   By: Rozell Cornet M.D.   On: 10/23/2023 21:37   US  Renal Transplant w/Doppler Result Date: 10/23/2023 CLINICAL DATA:  Elevated creatinine. Right lower quadrant renal transplant. EXAM: ULTRASOUND OF RENAL TRANSPLANT WITH RENAL DOPPLER ULTRASOUND TECHNIQUE: Ultrasound examination of the renal transplant was performed with gray-scale, color and duplex doppler evaluation. COMPARISON:  Ultrasound 08/23/2021. CT abdomen and pelvis 06/01/2019 FINDINGS: Transplant  kidney location: Right lower quadrant Transplant Kidney: Renal measurements: 11.1 x 3.8 x 4.1 cm = volume: 90mL. Normal in size and parenchymal echogenicity. No evidence of mass or hydronephrosis. No peri-transplant  fluid collection seen. Color flow in the main renal artery:  Yes Color flow in the main renal vein:  Yes Duplex Doppler Evaluation: Main Renal Artery Velocity: 65 cm/sec Main Renal Artery Resistive Index: 0.8 Venous waveform in main renal vein:  Present Intrarenal resistive index in upper pole:  0.8 (normal 0.6-0.8; equivocal 0.8-0.9; abnormal >= 0.9) Intrarenal resistive index in lower pole: 0.8 (normal 0.6-0.8; equivocal 0.8-0.9; abnormal >= 0.9) Bladder: Normal for degree of bladder distention. Other findings:  None. IMPRESSION: Normal ultrasound appearance of the right lower quadrant renal transplant kidney. Normal main renal artery flow velocities and resistive indices. Electronically Signed   By: Boyce Byes M.D.   On: 10/23/2023 17:34    PMH:   Past Medical History:  Diagnosis Date   Allergy    pollen   Anemia    Anxiety    BRCA2 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 03/01/2021   Breast cancer [facility])    Cataract    surgical repair bilateral   Complication of anesthesia    not herself for 2 days after last surgery (brest lumpectomy)   Coronary artery disease    Depression    ESRD on hemodialysis (HCC)    Home HD 5x per week- not on dialysis now had tramsplant 4/17   Family history of ovarian cancer 02/22/2021   GERD (gastroesophageal reflux disease)    Hearing loss 2017   right ear   History of blood transfusion    transfusion reaction   Hyperlipidemia    Hypertension    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 per pt-dr levy also II; Per patient 08/18/21 she is Type 2   Kidney transplant recipient    Leukocytosis 06/13/2021   Neuromuscular disorder (HCC)    NEUROPATHY   Sleep apnea     PSH:   Past Surgical History:  Procedure Laterality Date   ABDOMINAL HYSTERECTOMY     BREAST EXCISIONAL BIOPSY Right 08/2015   BREAST LUMPECTOMY WITH RADIOACTIVE SEED LOCALIZATION Right 09/14/2015   Procedure: RIGHT BREAST LUMPECTOMY  WITH RADIOACTIVE SEED LOCALIZATION;  Surgeon: Dareen Ebbing, MD;  Location: [facility];  Service: General;  Laterality: Right;   BREAST LUMPECTOMY WITH RADIOACTIVE SEED LOCALIZATION Left 03/02/2021   Procedure: LEFT BREAST SEED LUMPECTOMY LEFT SENTINEL LYMPH NODE MAPPING;  Surgeon: Sim Dryer, MD;  Location: [facility];  Service: General;  Laterality: Left;  GEN AND PEC BLOCK   BREAST SURGERY Bilateral    biopsy bilateral   CARDIAC CATHETERIZATION     CATARACT EXTRACTION Bilateral    bilateral   CHOLECYSTECTOMY     CYST REMOVAL NECK     DIALYSIS FISTULA CREATION Left    EYE SURGERY Bilateral    lazer   kidney transplant     LEFT HEART CATH AND CORONARY ANGIOGRAPHY N/A 03/04/2019   Procedure: LEFT HEART CATH AND CORONARY ANGIOGRAPHY;  Surgeon: Swaziland, Peter M, MD;  Location: [facility];  Service: Cardiovascular;  Laterality: N/A;   LEFT HEART CATHETERIZATION WITH CORONARY ANGIOGRAM N/A 03/30/2014   Procedure: LEFT HEART CATHETERIZATION WITH CORONARY ANGIOGRAM;  Surgeon: Mickiel [place], MD;  Location: [facility] CATH LAB;  Service: Cardiovascular;  Laterality: N/A;   LIPOMA EXCISION N/A 02/06/2017   Procedure: EXCISION POSTERIOR  NECK SEBACEOUS CYST;  Surgeon: Oza Blumenthal, MD;  Location: [facility] OR;  Service: General;  Laterality: N/A;   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   Procedure: REPAIR OF ARTERIOVENOUS FISTULA ANEURYSM;  Surgeon: Margherita Shell, MD;  Location: [facility] OR;  Service: Vascular;  Laterality: Left;   REVISON OF ARTERIOVENOUS FISTULA Left 09/28/2013   Procedure: EXCISE ESCHAR LEFT ARM  ARTERIOVENOUS FISTULA WITH PLICATION OF LEFT ARM ARTERIOVENOUS FISTULA;  Surgeon: Richrd Char, MD;  Location: [facility] OR;  Service: Vascular;  Laterality: Left;   REVISON OF ARTERIOVENOUS FISTULA Left 08/26/2015   Procedure: RESECTION ANEURYSM OF LEFT ARM ARTERIOVENOUS FISTULA  ;  Surgeon: Margherita Shell, MD;  Location: [facility] OR;  Service:  Vascular;  Laterality: Left;   ROBOTIC ASSISTED BILATERAL SALPINGO OOPHERECTOMY N/A 07/11/2022   Procedure: XI ROBOTIC ASSISTED BILATERAL SALPINGO OOPHORECTOMY;  Surgeon: Suzi Essex, MD;  Location: [facility] ORS;  Service: Gynecology;  Laterality: N/A;   SIMPLE MASTECTOMY WITH AXILLARY SENTINEL NODE BIOPSY Bilateral 08/22/2021   Procedure: BILATERAL SIMPLE MASTECTOMY;  Surgeon: Sim Dryer, MD;  Location: [facility] OR;  Service: General;  Laterality: Bilateral;   TUBAL LIGATION      Allergies:  Allergies  Allergen Reactions   Docetaxel  Nausea Only    Diaphoretic.  Patient had hypersensitivity reaction to docetaxel .  See progress note from 05/24/2021.  Patient able to complete infusion.   Norvasc  [Amlodipine ] Swelling   Tape Other (See Comments)    Burns skin.  Please use paper tape only    Medications:   Prior to Admission medications   Medication Sig Start Date End Date Taking? Authorizing Provider  Accu-Chek Softclix Lancets lancets Use as instructed Patient taking differently: 1 each by Other route See admin instructions. Use as instructed 08/03/20   Gwyndolyn Lerner, MD  aspirin  EC 81 MG tablet Take 81 mg by mouth daily. 08/01/21   [provider]  Blood Glucose Monitoring Suppl (ACCU-CHEK AVIVA CONNECT) w/Device KIT Check sugar 1x daily Patient taking differently: 1 each by Other route See admin instructions. Check sugar 1x daily 08/03/20   Gwyndolyn Lerner, MD  carvedilol  (COREG ) 25 MG tablet Take 25 mg by mouth 2 (two) times daily with a meal. 11/23/15   [provider]  cholecalciferol  (VITAMIN D3) 25 MCG (1000 UNIT) tablet Take 1,000 Units by mouth daily.    [provider]  cyanocobalamin  1000 MCG tablet Take 1,000 mcg by mouth daily.    [provider]  DROPLET PEN NEEDLES 31G X 8 MM MISC  03/30/22   [provider]  ENVARSUS  XR 1 MG TB24 Take 4 mg by mouth daily. 08/22/22   [provider]  escitalopram  (LEXAPRO ) 10 MG tablet Take 10 mg  by mouth daily. 03/08/21   [provider]  furosemide  (LASIX ) 40 MG tablet Take 40 mg by mouth 2 (two) times daily as needed for fluid.    [provider]  gabapentin  (NEURONTIN ) 300 MG capsule Take 2 capsules (600 mg total) by mouth 2 (two) times daily. 05/16/23   Boscia, Heather E, NP  Garlic 100 MG TABS Take 100 mg by mouth daily.    [provider]  glucose blood (ACCU-CHEK AVIVA PLUS) test strip Check sugar 1x daily Patient taking differently: 1 each by Other route See admin instructions. Check sugar 1x daily 08/03/20   Gwyndolyn Lerner, MD  hydrALAZINE  (APRESOLINE ) 25 MG tablet Take 25 mg by mouth 2 (two) times daily. 05/11/22   [provider]  HYDROcodone -acetaminophen  (  NORCO/VICODIN) 5-325 MG tablet Take 1-2 tablets by mouth every 4 (four) hours as needed. 10/03/23   Hershel Los, MD  insulin  glargine (LANTUS  SOLOSTAR) 100 UNIT/ML Solostar Pen Inject 25-30 Units into the skin See admin instructions. Inject 30 units into the skin in the morning and 25 units at bedtime    [provider]  Insulin  Pen Needle (PEN NEEDLES) 30G X 8 MM MISC 1 each by Does not apply route daily. E11.9 09/01/19   Gwyndolyn Lerner, MD  magnesium  oxide (MAG-OX) 400 MG tablet Take 400 mg by mouth 2 (two) times daily.    [provider]  multivitamin-lutein (OCUVITE-LUTEIN) CAPS capsule Take 1 capsule by mouth daily.    [provider]  mycophenolate  (MYFORTIC ) 180 MG EC tablet Take 360 mg by mouth 2 (two) times daily.    [provider]  ondansetron  (ZOFRAN ) 8 MG tablet Take 8 mg by mouth 2 (two) times daily.    [provider]  pantoprazole  (PROTONIX ) 40 MG tablet Take 1 tablet (40 mg total) by mouth 2 (two) times daily. 07/27/22   Mansouraty, Albino Alu., MD  predniSONE  (DELTASONE ) 5 MG tablet Take 5 mg by mouth daily.  01/04/17   [provider]  promethazine  (PHENERGAN ) 12.5 MG tablet Take 1 tablet (12.5 mg total) by mouth every 8 (eight)  hours as needed for nausea. 10/22/23   Mansouraty, Albino Alu., MD  rosuvastatin  (CRESTOR ) 5 MG tablet Take 5 mg by mouth at bedtime.    [provider]  senna-docusate (SENOKOT-S) 8.6-50 MG tablet Take 2 tablets by mouth at bedtime. For AFTER surgery, do not take if having diarrhea 07/06/22   Vira Grieves D, NP  tamoxifen  (NOLVADEX ) 20 MG tablet TAKE 1 TABLET(20 MG) BY MOUTH DAILY 03/18/23   Burton, [name] K, NP  tirzepatide [facility]) 12.5 MG/0.5ML Pen Inject 12.5 mg into the skin once a week. 02/01/22   [provider]  Vibegron (GEMTESA) 75 MG TABS Take 75 mg by mouth daily.    [provider]    Discontinued Meds:   Medications Discontinued During This Encounter  Medication Reason   insulin  aspart (novoLOG ) injection 0-5 Units    tacrolimus  ER (ENVARSUS  XR) tablet TB24 4 mg     Social History:  reports that she has never smoked. She has never used smokeless tobacco. She reports that she does not currently use alcohol. She reports that she does not use drugs.  Family History:   Family History  Problem Relation Age of Onset   Diabetes Mother    Kidney disease Mother    Diabetes Father    Heart disease Father    Kidney disease Father    Hypertension Father    Diabetes Sister    Asthma Sister    Lupus Sister    Diabetes Brother    Diabetes Brother    Diabetes Brother    Diabetes Brother    Cancer Maternal Grandmother 50       ovarian cancer   Ovarian cancer Maternal Grandmother    Diabetes Maternal Grandfather    Diabetes Paternal Grandmother    Colon cancer Neg Hx    Colon polyps Neg Hx    Esophageal cancer Neg Hx    Rectal cancer Neg Hx    Stomach cancer Neg Hx    Breast cancer Neg Hx    Endometrial cancer Neg Hx    Pancreatic cancer Neg Hx    Prostate cancer Neg Hx    Inflammatory bowel disease  Neg Hx    Liver disease Neg Hx     Blood pressure (!) 149/65, pulse 73, temperature 98.1 F (36.7 C), temperature source Oral, resp. rate 20,  height 5\' 1"  (1.549 m), weight 70 kg, SpO2 100%. Physical Exam: GEN: NAD, A&Ox3, NCAT HEENT: No conjunctival pallor, EOMI NECK: Supple, no thyromegaly LUNGS: CTA B/L no rales, rhonchi or wheezing CV: RRR, No M/R/G ABD: SNDNT +BS,  transplant kidney in the RLK, no tenderness to palpation EXT: No lower extremity edema ACCESS: Left brachiocephalic fistula good bruit     Elson Ulbrich, Alveda Aures, MD 10/24/2023, 7:23 AM

## 2023-10-24 NOTE — Inpatient Diabetes Management (Signed)
 Inpatient Diabetes Program Recommendations  AACE/ADA: New Consensus Statement on Inpatient Glycemic Control (2015)  Target Ranges:  Prepandial:   less than 140 mg/dL      Peak postprandial:   less than 180 mg/dL (1-2 hours)      Critically ill patients:  140 - 180 mg/dL   Lab Results  Component Value Date   GLUCAP 107 (H) 10/24/2023   HGBA1C 9.0 (H) 10/23/2023    Review of Glycemic Control  Latest Reference Range & Units 10/23/23 19:15 10/23/23 19:50 10/23/23 20:19 10/24/23 07:33  Glucose-Capillary 70 - 99 mg/dL 62 (L) 161 (H) 096 (H) 107 (H)   Diabetes history: DM  Outpatient Diabetes medications:  Mounjaro 12.5 mg weekly Lantus  30 units in the morning and Lantus  25 units in the HS Current orders for Inpatient glycemic control:  Novolog  0-9 units tid with meals    Inpatient Diabetes Program Recommendations:    Note patient takes Mounjaro GLP.  May need to be held after discharge? Will follow.   Thanks,  Josefa Ni, RN, BC-ADM Inpatient Diabetes Coordinator Pager [phone]  (8a-5p)

## 2023-10-24 NOTE — Hospital Course (Signed)
 53 y.o. female with medical history significant of CKD stage IIIb, s/p renal transplant, breast cancer s/p chemo and bilateral mastectomyh elevated creatinine levels.   She has been experiencing nausea, particularly when eating, for a little over two weeks, leading to a decreased appetite. She has consulted her gastroenterologist regarding these symptoms, who planned to perform an endoscopy and CT scan of her abdomen pelvis to assess her stomach condition. However, during blood work, her elevated creatinine levels were noted to be elevated up to 4.37 with BUN 57.  She was sent to the hospital for further evaluation.  Review of labs note lipase 32 and LFTs also within normal limits.   No recent symptoms of vomiting or diarrhea, except for the nausea associated with eating. She also denies any abdominal pain, attributing her stomach discomfort to hunger currently.     In  the emergency department patient was noted to be afebrile with stable vital signs.  Labs noted BUN 59 and creatinine 4.26.  Baseline creatinine previously noted to be around 1.7-2.3.  Nephrology was consulted with recommendations to check renal ultrasound and aggressive IV fluid rehydration.

## 2023-10-25 DIAGNOSIS — N189 Chronic kidney disease, unspecified: Secondary | ICD-10-CM | POA: Diagnosis not present

## 2023-10-25 DIAGNOSIS — N179 Acute kidney failure, unspecified: Secondary | ICD-10-CM | POA: Diagnosis not present

## 2023-10-25 LAB — CBC
HCT: 30.7 % — ABNORMAL LOW (ref 36.0–46.0)
Hemoglobin: 10.2 g/dL — ABNORMAL LOW (ref 12.0–15.0)
MCH: 30.4 pg (ref 26.0–34.0)
MCHC: 33.2 g/dL (ref 30.0–36.0)
MCV: 91.4 fL (ref 80.0–100.0)
Platelets: 151 10*3/uL (ref 150–400)
RBC: 3.36 MIL/uL — ABNORMAL LOW (ref 3.87–5.11)
RDW: 11.8 % (ref 11.5–15.5)
WBC: 4.6 10*3/uL (ref 4.0–10.5)
nRBC: 0 % (ref 0.0–0.2)

## 2023-10-25 LAB — GLUCOSE, CAPILLARY
Glucose-Capillary: 141 mg/dL — ABNORMAL HIGH (ref 70–99)
Glucose-Capillary: 166 mg/dL — ABNORMAL HIGH (ref 70–99)

## 2023-10-25 LAB — BASIC METABOLIC PANEL WITH GFR
Anion gap: 8 (ref 5–15)
BUN: 34 mg/dL — ABNORMAL HIGH (ref 6–20)
CO2: 23 mmol/L (ref 22–32)
Calcium: 9.8 mg/dL (ref 8.9–10.3)
Chloride: 109 mmol/L (ref 98–111)
Creatinine, Ser: 2.31 mg/dL — ABNORMAL HIGH (ref 0.44–1.00)
GFR, Estimated: 24 mL/min — ABNORMAL LOW (ref >60)
Glucose, Bld: 166 mg/dL — ABNORMAL HIGH (ref 70–99)
Potassium: 5 mmol/L (ref 3.5–5.1)
Sodium: 140 mmol/L (ref 135–145)

## 2023-10-25 NOTE — Progress Notes (Signed)
 Cynthia [name] to be discharged Home per MD order. Discussed prescriptions and follow up appointments with the patient. Prescriptions given to patient; medication list explained in detail. Patient verbalized understanding.  Skin clean, dry and intact without evidence of skin break down, no evidence of skin tears noted. IV catheter discontinued intact. Site without signs and symptoms of complications. Dressing and pressure applied. Pt denies pain at the site currently. No complaints noted.  Patient free of lines, drains, and wounds.   An After Visit Summary (AVS) was printed and given to the patient. Patient escorted via wheelchair, and discharged home via private auto.  Deberah Falconer, RN

## 2023-10-25 NOTE — TOC Transition Note (Signed)
 Transition of Care [facility]) - Discharge Note   Patient Details  Name: Cynthia [name] MRN: [id number] Date of Birth: Dec 26, 1970  Transition of Care [facility]) CM/SW Contact:  Tom-Johnson, Angelique Ken, RN Phone Number: 10/25/2023, 2:57 PM   Clinical Narrative:     Patient is scheduled for discharge today.  Readmission Risk Assessment done. Outpatient f/u, hospital f/u and discharge instructions on AVS. No TOC needs or recommendations noted. Significant other, Al to transport at discharge.  No further TOC needs noted.       Final next level of care: Home/Self Care Barriers to Discharge: Barriers Resolved   Patient Goals and CMS Choice Patient states their goals for this hospitalization and ongoing recovery are:: To return home CMS Medicare.gov Compare Post Acute Care list provided to:: Patient Choice offered to / list presented to : NA      Discharge Placement                Patient to be transferred to facility by: Significant other. Name of family member notified: Al    Discharge Plan and Services Additional resources added to the After Visit Summary for                  DME Arranged: N/A DME Agency: NA       HH Arranged: NA HH Agency: NA        Social Drivers of Health (SDOH) Interventions SDOH Screenings   Food Insecurity: No Food Insecurity (10/23/2023)  Housing: Low Risk  (10/23/2023)  Transportation Needs: No Transportation Needs (10/23/2023)  Utilities: Not At Risk (10/23/2023)  Financial Resource Strain: High Risk (02/22/2021)  Social Connections: Unknown (11/03/2021)   [facility]  Tobacco Use: Low Risk  (10/23/2023)     Readmission Risk Interventions    10/24/2023   11:06 AM 07/05/2021    2:50 PM 07/05/2021    2:48 PM  Readmission Risk Prevention Plan  Transportation Screening Complete Complete Complete  PCP or Specialist Appt within 5-7 Days Complete    Home Care Screening Complete    Medication Review (RN  CM) Referral to Pharmacy    Medication Review (RN Care Manager)  Complete Complete  PCP or Specialist appointment within 3-5 days of discharge  Complete Complete  HRI or Home Care Consult  Complete Complete  SW Recovery Care/Counseling Consult  Complete Complete  Palliative Care Screening  Not Applicable Not Applicable  Skilled Nursing Facility  Not Applicable Complete

## 2023-10-25 NOTE — Discharge Summary (Signed)
 Physician Discharge Summary   Patient: Cynthia [name] MRN: [id number] DOB: Sep 08, 1970  Admit date:     10/23/2023  Discharge date: 10/25/23  Discharge Physician: Cherylle Corwin   PCP: Hershell Lose, NP   Recommendations at discharge:    Follow up with PCP in 1-2 weeks Follow up with Transplant team as scheduled Please note that pt was recommended to stop Mounjaro given risk of side effects similar to pt's presenting symptoms. 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 *  Hospital Course: 53 y.o. female with medical history significant of CKD stage IIIb, s/p renal transplant, breast cancer s/p chemo and bilateral mastectomyh elevated creatinine levels.   She has been experiencing nausea, particularly when eating, for a little over two weeks, leading to a decreased appetite. She has consulted her gastroenterologist regarding these symptoms, who planned to perform an endoscopy and CT scan of her abdomen pelvis to assess her stomach condition. However, during blood work, her elevated creatinine levels were noted to be elevated up to 4.37 with BUN 57.  She was sent to the hospital for further evaluation.  Review of labs note lipase 32 and LFTs also within normal limits.   No recent symptoms of vomiting or diarrhea, except for the nausea associated with eating. She also denies any abdominal pain, attributing her stomach discomfort to hunger currently.     In  the emergency department patient was noted to be afebrile with stable vital signs.  Labs noted BUN 59 and creatinine 4.26.  Baseline creatinine previously noted to be around  1.7-2.3.  Nephrology was consulted with recommendations to check renal ultrasound and aggressive IV fluid rehydration.  Assessment and Plan: Acute kidney injury superimposed on chronic kidney disease S/p renal transplant Patient presents with creatinine elevated up to 4.26 with BUN 59.  Notes poor p.o. intake for the last 2 weeks due to persistent nausea.  Baseline creatinine previously noted to range from 1.7-2.3.   -Nephrology was consulted.  Patient was given 1 L bolus of IV fluids.   -Renal transplant ultrasound with Doppler noted normal ultrasound appearance of the right lower quadrant kidney transplant with normal renal artery velocities. - Continued prednisone , tacrolimus , and mycophenolate  - Cr improved. OK to d/c per Nephrology   Uncontrolled diabetes mellitus type 2 with hypoglycemia, with long-term use of insulin  - Hypoglycemic initially. Continue hypoglycemia protocol - A1c 9.0 - holding home long-acting insulin  - Continue CBGs before every meal with sensitive SSI -Pt noted to be on tirzepatide PTA. Recommend stopping this regimen given known side effects   Essential hypertension Blood pressure suboptimally controlled - cont home bp regimen - cont PRN hydralazine  as needed   History of breast cancer S/p initial lumpectomy and chemotherapy in 2022.  BRCA 2 gene patient positive patient had subsequent double mastectomy with Dr. Afton Horse in 07/2021.   Hyperlipidemia - Continue Crestor           Consultants: Nephrology Procedures performed:   Disposition: Home Diet recommendation:  Carb modified diet DISCHARGE MEDICATION: Allergies as of 10/25/2023       Reactions   Docetaxel  Nausea Only   Diaphoretic.  Patient had hypersensitivity reaction to docetaxel .  See progress note from 05/24/2021.  Patient able to complete infusion.   Latex Hives   Norvasc  [amlodipine ] Swelling   Tape Other (See Comments)   Burns skin.  Please use paper tape only        Medication  List     STOP taking these medications    NovoLOG  FlexPen 100 UNIT/ML FlexPen Generic drug: insulin  aspart   tamoxifen  20 MG tablet Commonly known as: NOLVADEX    tirzepatide 15 MG/0.5ML Pen Commonly known as: MOUNJARO       TAKE these medications    aspirin  EC 81 MG tablet Take 81 mg by mouth daily.   carvedilol  25 MG tablet Commonly known as: COREG  Take 25 mg by mouth 2 (two) times daily with a meal.   cholecalciferol  25 MCG (1000 UNIT) tablet Commonly known as: VITAMIN D3 Take 1,000 Units by mouth daily.   cyanocobalamin  1000 MCG tablet Take 1,000 mcg by mouth daily.   escitalopram  10 MG tablet Commonly known as: LEXAPRO  Take 10 mg by mouth daily.   furosemide  40 MG tablet Commonly known as: LASIX  Take 80 mg by mouth 2 (two) times daily as needed for fluid or edema.   gabapentin  300 MG capsule Commonly known as: NEURONTIN  Take 2 capsules (600 mg total) by mouth 2 (two) times daily.   Garlic 100 MG Tabs Take 100 mg by mouth daily.   hydrALAZINE  25 MG tablet Commonly known as: APRESOLINE  Take 25 mg by mouth 2 (two) times daily.   HYDROcodone -acetaminophen  5-325 MG tablet Commonly known as: NORCO/VICODIN Take 1-2 tablets by mouth every 4 (four) hours as needed. What changed: reasons to take this   Lantus  SoloStar 100 UNIT/ML Solostar Pen Generic drug: insulin  glargine Inject 30 Units into the skin 2 (two) times daily.   magnesium  oxide 400 MG tablet Commonly known as: MAG-OX Take 400 mg by mouth 2 (two) times daily.   methocarbamol  500 MG tablet Commonly known as: ROBAXIN  Take 500 mg by mouth every 8 (eight) hours as needed.   multivitamin-lutein Caps capsule Take 1 capsule by mouth daily.   mycophenolate  180 MG EC tablet Commonly known as: MYFORTIC  Take 360 mg by mouth 2 (two) times daily.   ondansetron  8 MG tablet Commonly known as: ZOFRAN  Take 8 mg by mouth every 8 (eight) hours as needed for nausea, vomiting or refractory nausea /  vomiting.   pantoprazole  40 MG tablet Commonly known as: PROTONIX  Take 1 tablet (40 mg total) by mouth 2 (two) times daily.   predniSONE  5 MG tablet Commonly known as: DELTASONE  Take 5 mg by mouth daily.   promethazine  12.5 MG tablet Commonly known as: PHENERGAN  Take 1 tablet (12.5 mg total) by mouth every 8 (eight) hours as needed for nausea.   rosuvastatin  5 MG tablet Commonly known as: CRESTOR  Take 5 mg by mouth at bedtime.   tacrolimus  1 MG capsule Commonly known as: PROGRAF  Take 2-3 mg by mouth 2 (two) times daily. Take three capsules in the morning. Take two capsules in the evening.        Follow-up Information     Hershell Lose, NP Follow up in 2 week(s).   Specialty: Nurse Practitioner Why: Hospital follow up Contact information: [address] [place] [address] [phone]         Follow up with your transplant team Follow up.   Why: As needed               Discharge Exam: Filed Weights   10/23/23  1333 10/23/23 2011  Weight: 69 kg 70 kg   General exam: Awake, laying in bed, in nad Respiratory system: Normal respiratory effort, no wheezing Cardiovascular system: regular rate, s1, s2 Gastrointestinal system: Soft, nondistended, positive BS Central nervous system: CN2-12 grossly intact, strength intact Extremities: Perfused, no clubbing Skin: Normal skin turgor, no notable skin lesions seen Psychiatry: Mood normal // no visual hallucinations   Condition at discharge: fair  The results of significant diagnostics from this hospitalization (including imaging, microbiology, ancillary and laboratory) are listed below for reference.   Imaging Studies: CT ABDOMEN PELVIS WO CONTRAST Result Date: 10/23/2023 CLINICAL DATA:  Acute nonlocalized abdominal pain. EXAM: CT ABDOMEN AND PELVIS WITHOUT CONTRAST TECHNIQUE: Multidetector CT imaging of the abdomen and pelvis was performed following the standard protocol without IV contrast. RADIATION DOSE  REDUCTION: This exam was performed according to the departmental dose-optimization program which includes automated exposure control, adjustment of the mA and/or kV according to patient size and/or use of iterative reconstruction technique. COMPARISON:  Renal transplant ultrasound 10/23/2023 and CT abdomen pelvis 06/01/2019 FINDINGS: Lower chest: No acute abnormality. Hepatobiliary: Cholecystectomy. Unremarkable noncontrast appearance of the liver. No biliary dilation. Pancreas: Unremarkable. Spleen: Unremarkable. Adrenals/Urinary Tract: Normal adrenal glands. Atrophic native kidneys. Right lower quadrant renal transplant. No urinary calculi or hydronephrosis. Unremarkable bladder. Stomach/Bowel: Normal caliber large and small bowel. Moderate colonic stool burden. Colonic diverticulosis without diverticulitis. Stomach and appendix are within normal limits. Vascular/Lymphatic: Aortic atherosclerosis. No enlarged abdominal or pelvic lymph nodes. Reproductive: Hysterectomy.  No adnexal mass. Other: No free intraperitoneal fluid or air. Nodularity in the subcutaneous fat in the anterior right abdominal wall and along the right anterior abdominal musculature (series 3/image [id number]) is unchanged from 06/01/2019. Musculoskeletal: No acute fracture. IMPRESSION: 1. No acute abnormality in the abdomen or pelvis.  Normal appendix. 2. Colonic diverticulosis without diverticulitis. 3. Right lower quadrant renal transplant. 4. Aortic Atherosclerosis (ICD10-I70.0). Electronically Signed   By: Rozell Cornet M.D.   On: 10/23/2023 21:37   US  Renal Transplant w/Doppler Result Date: 10/23/2023 CLINICAL DATA:  Elevated creatinine. Right lower quadrant renal transplant. EXAM: ULTRASOUND OF RENAL TRANSPLANT WITH RENAL DOPPLER ULTRASOUND TECHNIQUE: Ultrasound examination of the renal transplant was performed with gray-scale, color and duplex doppler evaluation. COMPARISON:  Ultrasound 08/23/2021. CT abdomen and pelvis 06/01/2019  FINDINGS: Transplant kidney location: Right lower quadrant Transplant Kidney: Renal measurements: 11.1 x 3.8 x 4.1 cm = volume: 90mL. Normal in size and parenchymal echogenicity. No evidence of mass or hydronephrosis. No peri-transplant fluid collection seen. Color flow in the main renal artery:  Yes Color flow in the main renal vein:  Yes Duplex Doppler Evaluation: Main Renal Artery Velocity: 65 cm/sec Main Renal Artery Resistive Index: 0.8 Venous waveform in main renal vein:  Present Intrarenal resistive index in upper pole:  0.8 (normal 0.6-0.8; equivocal 0.8-0.9; abnormal >= 0.9) Intrarenal resistive index in lower pole: 0.8 (normal 0.6-0.8; equivocal 0.8-0.9; abnormal >= 0.9) Bladder: Normal for degree of bladder distention. Other findings:  None. IMPRESSION: Normal ultrasound appearance of the right lower quadrant renal transplant kidney. Normal main renal artery flow velocities and resistive indices. Electronically Signed   By: Boyce Byes M.D.   On: 10/23/2023 17:34   DG Shoulder Right Result Date: 10/03/2023 CLINICAL DATA:  Right shoulder pain for 3 days.  No known injury. EXAM: RIGHT SHOULDER - 2+ VIEW COMPARISON:  None available FINDINGS: Irregularity of the greater and lesser tuberosities indicative of rotator cuff tendinopathy. Mild degenerative changes of the acromioclavicular joint. No fracture or  dislocation. IMPRESSION: 1. No acute abnormality of the right shoulder. 2. Degenerative changes of the [facility] joint. 3. Rotator cuff tendinopathy. Electronically Signed   By: Elester Grim M.D.   On: 10/03/2023 15:09    Microbiology: Results for orders placed or performed during the hospital encounter of 08/22/21  Culture, blood (routine x 2)     Status: None   Collection Time: 08/24/21  2:17 PM   Specimen: BLOOD  Result Value Ref Range Status   Specimen Description BLOOD RIGHT ANTECUBITAL  Final   Special Requests   Final    BOTTLES DRAWN AEROBIC AND ANAEROBIC Blood Culture adequate volume    Culture   Final    NO GROWTH 5 DAYS Performed at [facility], [address] [place], [place] [address]    Report Status 08/29/2021 FINAL  Final  Culture, blood (routine x 2)     Status: None   Collection Time: 08/24/21  2:19 PM   Specimen: BLOOD  Result Value Ref Range Status   Specimen Description BLOOD RIGHT ANTECUBITAL  Final   Special Requests   Final    BOTTLES DRAWN AEROBIC AND ANAEROBIC Blood Culture adequate volume   Culture   Final    NO GROWTH 5 DAYS Performed at [facility], [address] [place], [place] [address]    Report Status 08/29/2021 FINAL  Final    Labs: CBC: Recent Labs  Lab 10/22/23 1025 10/23/23 1349 10/24/23 0517 10/25/23 1152  WBC 4.8 4.4 4.0 4.6  NEUTROABS  --  2.6  --   --   HGB 12.3 11.5* 10.0* 10.2*  HCT 35.9* 34.8* 30.6* 30.7*  MCV 88.3 91.6 89.5 91.4  PLT 162.0 168 146* 151   Basic Metabolic Panel: Recent Labs  Lab 10/22/23 1025 10/23/23 1349 10/24/23 0517 10/25/23 1152  NA 138 138 139 140  K 4.3 4.3 4.4 5.0  CL 100 103 107 109  CO2 30 26 22 23   GLUCOSE 170* 188* 174* 166*  BUN 57* 59* 49* 34*  CREATININE 4.37* 4.26* 3.24* 2.31*  CALCIUM  10.3 10.3 9.4 9.8   Liver Function Tests: Recent Labs  Lab 10/22/23 1025 10/23/23 1349 10/24/23 0517  AST 24 27 21   ALT 27 29 23   ALKPHOS 68 56 50  BILITOT 0.6 0.7 0.7  PROT 6.1 6.2* 5.1*  ALBUMIN  4.2 3.9 3.2*   CBG: Recent Labs  Lab 10/24/23 1138 10/24/23 1624 10/24/23 1924 10/25/23 0749 10/25/23 1152  GLUCAP 142* 145* 201* 141* 166*    Discharge time spent: less than 30 minutes.  Signed: Cherylle Corwin, MD [facility] 10/25/2023

## 2023-10-25 NOTE — Progress Notes (Signed)
 Cynthia [name] is an 53 y.o. 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  Creatinine in July 25, 2023 was 2.2, UPC in early January 2025 was only 48 mg/g.  Cr has been rising over the past 6 months with creatinine at 1.9 on April 17, 2023, 2.27 October 03, 2023 and 4.37 on October 22, 2023.  2 wk h/o intermittent abdominal discomfort with dysuria as well but denies any fever chills, nausea, vomiting.  She has not been eating much solids at all and has only been drinking a little bit.  She has taken immunosuppressives over the past 2 wks.  Assessment/Plan: Acute knee injury on CKD 3B with a baseline creatinine approximately 1.7-2.3  - acute component may be secondary to prerenal azotemia as the patient has not been eating and drinking very much in the past 2 weeks because of intermittent abdominal discomfort as well as depression. -Transplant renal ultrasound -> no obstruction and vasculature fine as well. -Urinalysis not c/w UTI.   -Continue immunosuppressives at home doses and check a tacrolimus  level trough (pending) -Restart Lasix  upon discharge  -Fortunately she is showing improvement in renal function and will hold on transfer to [facility] with Dr. Noretta Bears for a renal biopsy to rule out rejection; ordered a BMET for this morning and as long as it is improving from renal standpoint she should be able to go home.  Clinically much improved.   -Monitor Daily I/Os, Daily weight  -Maintain MAP>65 for optimal renal perfusion.  - Avoid nephrotoxic agents such as IV contrast, NSAIDs, and phosphate containing bowel preps (FLEETS)   Abdominal pain -u/a consistent with UTI.  CT scan does not show any acute abnormalities in the abdomen or pelvis. Hypertension -does not tolerate  amlodipine  which caused severe lower extremity edema Depression -she states that this has been worse recently but denies any suicidal or homicidal ideation. H/o breast cancer followed by Dr. Sonja [place]  Subjective: She is feeling better, ate her entire dinner again last night without any abdominal discomfort.  Denies any fever chills shortness of breath.   Chemistry and CBC: Creatinine  Date/Time Value Ref Range Status  04/17/2023 10:44 AM 1.89 (H) 0.44 - 1.00 mg/dL Final  14/78/2956 21:30 AM 1.47 (H) 0.44 - 1.00 mg/dL Final  86/57/8469 62:95 AM 1.12 (H) 0.44 - 1.00 mg/dL Final  28/41/3244 01:02 AM 2.13 (H) 0.44 - 1.00 mg/dL Final  72/53/6644 03:47 AM 1.34 (H) 0.44 - 1.00 mg/dL Final  42/59/5638 75:64 AM 1.37 (H) 0.44 - 1.00 mg/dL Final  33/29/5188 41:66 AM 1.57 (H) 0.44 - 1.00 mg/dL Final  12/23/1599 09:32 AM 1.66 (H) 0.44 - 1.00 mg/dL Final  35/57/3220 25:42 AM 1.29 (H) 0.44 - 1.00 mg/dL Final  70/62/3762 83:15 AM 1.16 (H) 0.44 - 1.00 mg/dL Final  17/61/6073 71:06 PM 1.25 (H) 0.44 - 1.00 mg/dL Final  26/94/8546 27:03 AM 1.24 (H) 0.44 - 1.00 mg/dL Final  50/02/3817 29:93 AM 1.27 (H) 0.44 - 1.00 mg/dL Final   Creatinine, Ser  Date/Time Value Ref Range Status  10/24/2023 05:17 AM 3.24 (H) 0.44 - 1.00 mg/dL Final  71/69/6789 38:10 PM 4.26 (H) 0.44 - 1.00 mg/dL Final  17/51/0258 52:77 AM 4.37 (H) 0.40 - 1.20 mg/dL Final  82/42/3536 14:43 PM 2.27 (H) 0.44 - 1.00 mg/dL Final  07/09/2022 09:30 AM 1.72 (H) 0.44 - 1.00 mg/dL Final  16/03/9603 54:09 PM 2.07 (H) 0.44 - 1.00 mg/dL Final  81/19/1478 29:56 AM 1.59 (H) 0.44 - 1.00 mg/dL Final  21/30/8657 84:69 AM 2.09 (H) 0.44 - 1.00 mg/dL Final  62/95/2841 32:44 AM 2.30 (H) 0.44 - 1.00 mg/dL Final  06/27/7251 66:44 PM 2.60 (H) 0.44 - 1.00 mg/dL Final  03/47/4259 56:38 AM 2.59 (H) 0.44 - 1.00 mg/dL Final  75/64/3329 51:88 AM 0.99 0.44 - 1.00 mg/dL Final  41/66/0630 16:01 AM 1.08 (H) 0.44 - 1.00 mg/dL Final  09/32/3557 32:20 AM 1.07 (H) 0.44 -  1.00 mg/dL Final  25/42/7062 37:62 PM 1.17 (H) 0.44 - 1.00 mg/dL Final  83/15/1761 60:73 AM 1.26 (H) 0.44 - 1.00 mg/dL Final  71/11/2692 85:46 AM 1.28 (H) 0.44 - 1.00 mg/dL Final  27/08/5007 38:18 AM 1.36 (H) 0.44 - 1.00 mg/dL Final  29/93/7169 67:89 AM 1.61 (H) 0.44 - 1.00 mg/dL Final  38/03/1750 02:58 PM 1.21 (H) 0.44 - 1.00 mg/dL Final  52/77/8242 35:36 PM 1.24 (H) 0.44 - 1.00 mg/dL Final  14/43/1540 08:67 AM 1.58 (H) 0.44 - 1.00 mg/dL Final  61/95/0932 67:12 AM 0.90 0.44 - 1.00 mg/dL Final  45/80/9983 38:25 AM 0.93 0.44 - 1.00 mg/dL Final  05/39/7673 41:93 AM 0.84 0.44 - 1.00 mg/dL Final  79/07/4095 35:32 AM 0.77 0.44 - 1.00 mg/dL Final  99/24/2683 41:96 AM 1.18 (H) 0.44 - 1.00 mg/dL Final  22/29/7989 21:19 AM 1.30 (H) 0.44 - 1.00 mg/dL Final  41/74/0814 48:18 PM 1.51 (H) 0.44 - 1.00 mg/dL Final  56/31/4970 26:37 AM 1.11 (H) 0.57 - 1.00 mg/dL Final  85/88/5027 74:12 AM 1.27 (H) 0.44 - 1.00 mg/dL Final  87/86/7672 09:47 AM 2.40 (H) 0.44 - 1.00 mg/dL Final  09/62/8366 29:47 AM 1.59 (H) 0.44 - 1.00 mg/dL Final  65/46/5035 46:56 PM 1.18 (H) 0.44 - 1.00 mg/dL Final    Comment:    DELTA CHECK NOTED DIALYSIS   11/18/2015 03:02 PM 2.95 (H) 0.44 - 1.00 mg/dL Final  81/27/5170 01:74 AM 3.20 (H) 0.44 - 1.00 mg/dL Final  94/49/6759 16:38 PM 3.59 (H) 0.44 - 1.00 mg/dL Final  46/65/9935 70:17 AM 5.20 (H) 0.44 - 1.00 mg/dL Final  79/39/0300 92:33 AM 4.39 (H) 0.44 - 1.00 mg/dL Final  00/76/2263 33:54 AM 4.69 (H) 0.44 - 1.00 mg/dL Final  56/25/6389 37:34 AM 4.17 (H) 0.44 - 1.00 mg/dL Final  28/76/8115 72:62 PM 8.30 (H) 0.44 - 1.00 mg/dL Final  03/55/9741 63:84 AM 11.08 (H) 0.44 - 1.00 mg/dL Final  53/64/6803 21:22 PM 9.39 (H) 0.44 - 1.00 mg/dL Final  48/25/0037 04:88 AM 10.47 (H) 0.44 - 1.00 mg/dL Final  89/16/9450 38:88 AM 8.10 (H) 0.44 - 1.00 mg/dL Final  28/00/3491 79:15 AM 9.93 (H) 0.50 - 1.10 mg/dL Final  05/69/7948 01:65 AM 6.88 (H) 0.50 - 1.10 mg/dL Final    Comment:    DELTA CHECK  NOTED  03/29/2014 05:27 PM 4.37 (H) 0.50 - 1.10 mg/dL Final    Comment:    DELTA CHECK NOTED DIALYSIS  03/29/2014 06:16 AM 9.35 (H) 0.50 - 1.10 mg/dL Final  53/74/8270 78:67 AM 8.38 (H) 0.50 - 1.10 mg/dL Final  54/49/2010 07:12 AM 7.81 (H) 0.50 - 1.10 mg/dL Final   Recent Labs  Lab 10/22/23 1025 10/23/23 1349 10/24/23 0517  NA 138 138 139  K 4.3 4.3 4.4  CL 100 103 107  CO2 30 26 22   GLUCOSE 170* 188* 174*  BUN 57* 59* 49*  CREATININE 4.37* 4.26* 3.24*  CALCIUM  10.3 10.3 9.4   Recent Labs  Lab 10/22/23 1025 10/23/23 1349 10/24/23 0517  WBC 4.8 4.4 4.0  NEUTROABS  --  2.6  --   HGB 12.3 11.5* 10.0*  HCT 35.9* 34.8* 30.6*  MCV 88.3 91.6 89.5  PLT 162.0 168 146*   Liver Function Tests: Recent Labs  Lab 10/22/23 1025 10/23/23 1349 10/24/23 0517  AST 24 27 21   ALT 27 29 23   ALKPHOS 68 56 50  BILITOT 0.6 0.7 0.7  PROT 6.1 6.2* 5.1*  ALBUMIN  4.2 3.9 3.2*   Recent Labs  Lab 10/22/23 1025  LIPASE 32.0  AMYLASE 39   No results for input(s): "AMMONIA" in the last 168 hours. Cardiac Enzymes: No results for input(s): "CKTOTAL", "CKMB", "CKMBINDEX", "TROPONINI" in the last 168 hours. Iron Studies:  Recent Labs    10/22/23 1025  IRON 96  TIBC 254.8  TRANSFERRIN 182.0*  FERRITIN 1,122.8*   PT/INR: @LABRCNTIP (inr:5)  Xrays/Other Studies: ) Results for orders placed or performed during the hospital encounter of 10/23/23 (from the past 48 hours)  Comprehensive metabolic panel     Status: Abnormal   Collection Time: 10/23/23  1:49 PM  Result Value Ref Range   Sodium 138 135 - 145 mmol/L   Potassium 4.3 3.5 - 5.1 mmol/L   Chloride 103 98 - 111 mmol/L   CO2 26 22 - 32 mmol/L   Glucose, Bld 188 (H) 70 - 99 mg/dL    Comment: Glucose reference range applies only to samples taken after fasting for at least 8 hours.   BUN 59 (H) 6 - 20 mg/dL   Creatinine, Ser 5.78 (H) 0.44 - 1.00 mg/dL   Calcium  10.3 8.9 - 10.3 mg/dL   Total Protein 6.2 (L) 6.5 - 8.1 g/dL    Albumin  3.9 3.5 - 5.0 g/dL   AST 27 15 - 41 U/L   ALT 29 0 - 44 U/L   Alkaline Phosphatase 56 38 - 126 U/L   Total Bilirubin 0.7 0.0 - 1.2 mg/dL   GFR, Estimated 12 (L) >60 mL/min    Comment: (NOTE) Calculated using the CKD-EPI Creatinine Equation (2021)    Anion gap 9 5 - 15    Comment: Performed at [facility], [address] [place], [place] [address]  CBC with Differential     Status: Abnormal   Collection Time: 10/23/23  1:49 PM  Result Value Ref Range   WBC 4.4 4.0 - 10.5 K/uL   RBC 3.80 (L) 3.87 - 5.11 MIL/uL   Hemoglobin 11.5 (L) 12.0 - 15.0 g/dL   HCT 95.2 (L) 84.1 - 32.4 %   MCV 91.6 80.0 - 100.0 fL   MCH 30.3 26.0 - 34.0 pg   MCHC 33.0 30.0 - 36.0 g/dL   RDW 40.1 02.7 - 25.3 %   Platelets 168 150 - 400 K/uL   nRBC 0.0 0.0 - 0.2 %   Neutrophils Relative % 60 %   Neutro Abs 2.6 1.7 - 7.7 K/uL   Lymphocytes Relative 28 %   Lymphs Abs 1.2 0.7 - 4.0 K/uL   Monocytes Relative 9 %   Monocytes Absolute 0.4 0.1 - 1.0 K/uL   Eosinophils Relative 2 %   Eosinophils Absolute 0.1 0.0 - 0.5 K/uL   Basophils Relative 1 %   Basophils Absolute 0.1 0.0 - 0.1 K/uL   Immature Granulocytes 0 %   Abs Immature Granulocytes 0.01 0.00 - 0.07 K/uL    Comment:  Performed at [facility], [address] [place], [place] [address]  Hemoglobin A1c     Status: Abnormal   Collection Time: 10/23/23  1:56 PM  Result Value Ref Range   Hgb A1c MFr Bld 9.0 (H) 4.8 - 5.6 %    Comment: (NOTE) Pre diabetes:          5.7%-6.4%  Diabetes:              >6.4%  Glycemic control for   <7.0% adults with diabetes    Mean Plasma Glucose 211.6 mg/dL    Comment: Performed at [facility], [address], [place], [place] [address]  Urinalysis, w/ Reflex to Culture (Infection Suspected) -Urine, Clean Catch     Status: Abnormal   Collection Time: 10/23/23  4:42 PM  Result Value Ref Range   Specimen Source URINE, CLEAN CATCH    Color, Urine YELLOW YELLOW   APPearance CLEAR CLEAR    Specific Gravity, Urine 1.005 1.005 - 1.030   pH 7.0 5.0 - 8.0   Glucose, UA NEGATIVE NEGATIVE mg/dL   Hgb urine dipstick NEGATIVE NEGATIVE   Bilirubin Urine NEGATIVE NEGATIVE   Ketones, ur NEGATIVE NEGATIVE mg/dL   Protein, ur NEGATIVE NEGATIVE mg/dL   Nitrite NEGATIVE NEGATIVE   Leukocytes,Ua NEGATIVE NEGATIVE   RBC / HPF 0-5 0 - 5 RBC/hpf   WBC, UA 0-5 0 - 5 WBC/hpf    Comment:        Reflex urine culture not performed if WBC <=10, OR if Squamous epithelial cells >5. If Squamous epithelial cells >5 suggest recollection.    Bacteria, UA RARE (A) NONE SEEN   Squamous Epithelial / HPF 0-5 0 - 5 /HPF    Comment: Performed at [facility], [address], [place], [place] [address]  HIV Antibody (routine testing w rflx)     Status: None   Collection Time: 10/23/23  5:40 PM  Result Value Ref Range   HIV Screen 4th Generation wRfx Non Reactive Non Reactive    Comment: Performed at [facility], [address], [place], [place] [address]  CBG monitoring, ED     Status: Abnormal   Collection Time: 10/23/23  6:58 PM  Result Value Ref Range   Glucose-Capillary 63 (L) 70 - 99 mg/dL    Comment: Glucose reference range applies only to samples taken after fasting for at least 8 hours.  CBG monitoring, ED     Status: Abnormal   Collection Time: 10/23/23  7:15 PM  Result Value Ref Range   Glucose-Capillary 62 (L) 70 - 99 mg/dL    Comment: Glucose reference range applies only to samples taken after fasting for at least 8 hours.  Glucose, capillary     Status: Abnormal   Collection Time: 10/23/23  7:50 PM  Result Value Ref Range   Glucose-Capillary 104 (H) 70 - 99 mg/dL    Comment: Glucose reference range applies only to samples taken after fasting for at least 8 hours.  Glucose, capillary     Status: Abnormal   Collection Time: 10/23/23  8:19 PM  Result Value Ref Range   Glucose-Capillary 121 (H) 70 - 99 mg/dL    Comment: Glucose reference range applies only to samples taken  after fasting for at least 8 hours.  CBC     Status: Abnormal   Collection Time: 10/24/23  5:17 AM  Result Value Ref Range   WBC 4.0 4.0 - 10.5 K/uL  RBC 3.42 (L) 3.87 - 5.11 MIL/uL   Hemoglobin 10.0 (L) 12.0 - 15.0 g/dL   HCT 75.6 (L) 43.3 - 29.5 %   MCV 89.5 80.0 - 100.0 fL   MCH 29.2 26.0 - 34.0 pg   MCHC 32.7 30.0 - 36.0 g/dL   RDW 18.8 41.6 - 60.6 %   Platelets 146 (L) 150 - 400 K/uL   nRBC 0.0 0.0 - 0.2 %    Comment: Performed at [facility], [address], [place], [place] [address]  Comprehensive metabolic panel     Status: Abnormal   Collection Time: 10/24/23  5:17 AM  Result Value Ref Range   Sodium 139 135 - 145 mmol/L   Potassium 4.4 3.5 - 5.1 mmol/L   Chloride 107 98 - 111 mmol/L   CO2 22 22 - 32 mmol/L   Glucose, Bld 174 (H) 70 - 99 mg/dL    Comment: Glucose reference range applies only to samples taken after fasting for at least 8 hours.   BUN 49 (H) 6 - 20 mg/dL   Creatinine, Ser 1.09 (H) 0.44 - 1.00 mg/dL   Calcium  9.4 8.9 - 10.3 mg/dL   Total Protein 5.1 (L) 6.5 - 8.1 g/dL   Albumin  3.2 (L) 3.5 - 5.0 g/dL   AST 21 15 - 41 U/L   ALT 23 0 - 44 U/L   Alkaline Phosphatase 50 38 - 126 U/L   Total Bilirubin 0.7 0.0 - 1.2 mg/dL   GFR, Estimated 16 (L) >60 mL/min    Comment: (NOTE) Calculated using the CKD-EPI Creatinine Equation (2021)    Anion gap 10 5 - 15    Comment: Performed at [facility], [address] [place] [address] [place], [place] [address]  Glucose, capillary     Status: Abnormal   Collection Time: 10/24/23  7:33 AM  Result Value Ref Range   Glucose-Capillary 107 (H) 70 - 99 mg/dL    Comment: Glucose reference range applies only to samples taken after fasting for at least 8 hours.  Glucose, capillary     Status: Abnormal   Collection Time: 10/24/23 11:38 AM  Result Value Ref Range   Glucose-Capillary 142 (H) 70 - 99 mg/dL    Comment: Glucose reference range applies only to samples taken after fasting for at least 8 hours.  Glucose, capillary      Status: Abnormal   Collection Time: 10/24/23  4:24 PM  Result Value Ref Range   Glucose-Capillary 145 (H) 70 - 99 mg/dL    Comment: Glucose reference range applies only to samples taken after fasting for at least 8 hours.  Glucose, capillary     Status: Abnormal   Collection Time: 10/24/23  7:24 PM  Result Value Ref Range   Glucose-Capillary 201 (H) 70 - 99 mg/dL    Comment: Glucose reference range applies only to samples taken after fasting for at least 8 hours.  Glucose, capillary     Status: Abnormal   Collection Time: 10/25/23  7:49 AM  Result Value Ref Range   Glucose-Capillary 141 (H) 70 - 99 mg/dL    Comment: Glucose reference range applies only to samples taken after fasting for at least 8 hours.   CT ABDOMEN PELVIS WO CONTRAST Result Date: 10/23/2023 CLINICAL DATA:  Acute nonlocalized abdominal pain. EXAM: CT ABDOMEN AND PELVIS WITHOUT CONTRAST TECHNIQUE: Multidetector CT imaging of the abdomen and pelvis was performed following the standard protocol without IV contrast. RADIATION DOSE REDUCTION: This exam was performed  according to the departmental dose-optimization program which includes automated exposure control, adjustment of the mA and/or kV according to patient size and/or use of iterative reconstruction technique. COMPARISON:  Renal transplant ultrasound 10/23/2023 and CT abdomen pelvis 06/01/2019 FINDINGS: Lower chest: No acute abnormality. Hepatobiliary: Cholecystectomy. Unremarkable noncontrast appearance of the liver. No biliary dilation. Pancreas: Unremarkable. Spleen: Unremarkable. Adrenals/Urinary Tract: Normal adrenal glands. Atrophic native kidneys. Right lower quadrant renal transplant. No urinary calculi or hydronephrosis. Unremarkable bladder. Stomach/Bowel: Normal caliber large and small bowel. Moderate colonic stool burden. Colonic diverticulosis without diverticulitis. Stomach and appendix are within normal limits. Vascular/Lymphatic: Aortic atherosclerosis. No  enlarged abdominal or pelvic lymph nodes. Reproductive: Hysterectomy.  No adnexal mass. Other: No free intraperitoneal fluid or air. Nodularity in the subcutaneous fat in the anterior right abdominal wall and along the right anterior abdominal musculature (series 3/image [id number]) is unchanged from 06/01/2019. Musculoskeletal: No acute fracture. IMPRESSION: 1. No acute abnormality in the abdomen or pelvis.  Normal appendix. 2. Colonic diverticulosis without diverticulitis. 3. Right lower quadrant renal transplant. 4. Aortic Atherosclerosis (ICD10-I70.0). Electronically Signed   By: Rozell Cornet M.D.   On: 10/23/2023 21:37   US  Renal Transplant w/Doppler Result Date: 10/23/2023 CLINICAL DATA:  Elevated creatinine. Right lower quadrant renal transplant. EXAM: ULTRASOUND OF RENAL TRANSPLANT WITH RENAL DOPPLER ULTRASOUND TECHNIQUE: Ultrasound examination of the renal transplant was performed with gray-scale, color and duplex doppler evaluation. COMPARISON:  Ultrasound 08/23/2021. CT abdomen and pelvis 06/01/2019 FINDINGS: Transplant kidney location: Right lower quadrant Transplant Kidney: Renal measurements: 11.1 x 3.8 x 4.1 cm = volume: 90mL. Normal in size and parenchymal echogenicity. No evidence of mass or hydronephrosis. No peri-transplant fluid collection seen. Color flow in the main renal artery:  Yes Color flow in the main renal vein:  Yes Duplex Doppler Evaluation: Main Renal Artery Velocity: 65 cm/sec Main Renal Artery Resistive Index: 0.8 Venous waveform in main renal vein:  Present Intrarenal resistive index in upper pole:  0.8 (normal 0.6-0.8; equivocal 0.8-0.9; abnormal >= 0.9) Intrarenal resistive index in lower pole: 0.8 (normal 0.6-0.8; equivocal 0.8-0.9; abnormal >= 0.9) Bladder: Normal for degree of bladder distention. Other findings:  None. IMPRESSION: Normal ultrasound appearance of the right lower quadrant renal transplant kidney. Normal main renal artery flow velocities and resistive indices.  Electronically Signed   By: Boyce Byes M.D.   On: 10/23/2023 17:34    PMH:   Past Medical History:  Diagnosis Date   Allergy    pollen   Anemia    Anxiety    BRCA2 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 03/01/2021   Breast cancer [facility])    Cataract    surgical repair bilateral   Complication of anesthesia    not herself for 2 days after last surgery (brest lumpectomy)   Coronary artery disease    Depression    ESRD on hemodialysis (HCC)    Home HD 5x per week- not on dialysis now had tramsplant 4/17   Family history of ovarian cancer 02/22/2021   GERD (gastroesophageal reflux disease)    Hearing loss 2017   right ear   History of blood transfusion    transfusion reaction   Hyperlipidemia    Hypertension    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 per pt-dr levy also II; Per patient 08/18/21 she is Type 2   Kidney transplant recipient    Leukocytosis 06/13/2021   Neuromuscular disorder (HCC)    NEUROPATHY   Sleep apnea  PSH:   Past Surgical History:  Procedure Laterality Date   ABDOMINAL HYSTERECTOMY     BREAST EXCISIONAL BIOPSY Right 08/2015   BREAST LUMPECTOMY WITH RADIOACTIVE SEED LOCALIZATION Right 09/14/2015   Procedure: RIGHT BREAST LUMPECTOMY WITH RADIOACTIVE SEED LOCALIZATION;  Surgeon: Dareen Ebbing, MD;  Location: [facility];  Service: General;  Laterality: Right;   BREAST LUMPECTOMY WITH RADIOACTIVE SEED LOCALIZATION Left 03/02/2021   Procedure: LEFT BREAST SEED LUMPECTOMY LEFT SENTINEL LYMPH NODE MAPPING;  Surgeon: Sim Dryer, MD;  Location: [facility];  Service: General;  Laterality: Left;  GEN AND PEC BLOCK   BREAST SURGERY Bilateral    biopsy bilateral   CARDIAC CATHETERIZATION     CATARACT EXTRACTION Bilateral    bilateral   CHOLECYSTECTOMY     CYST REMOVAL NECK     DIALYSIS FISTULA CREATION Left    EYE SURGERY Bilateral    lazer   kidney transplant     LEFT  HEART CATH AND CORONARY ANGIOGRAPHY N/A 03/04/2019   Procedure: LEFT HEART CATH AND CORONARY ANGIOGRAPHY;  Surgeon: Swaziland, Peter M, MD;  Location: [facility] INVASIVE CV LAB;  Service: Cardiovascular;  Laterality: N/A;   LEFT HEART CATHETERIZATION WITH CORONARY ANGIOGRAM N/A 03/30/2014   Procedure: LEFT HEART CATHETERIZATION WITH CORONARY ANGIOGRAM;  Surgeon: Mickiel Albany, MD;  Location: [facility] CATH LAB;  Service: Cardiovascular;  Laterality: N/A;   LIPOMA EXCISION N/A 02/06/2017   Procedure: EXCISION POSTERIOR NECK SEBACEOUS CYST;  Surgeon: Oza Blumenthal, MD;  Location: [facility] OR;  Service: General;  Laterality: N/A;   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   Procedure: REPAIR OF ARTERIOVENOUS FISTULA ANEURYSM;  Surgeon: Margherita Shell, MD;  Location: [facility] OR;  Service: Vascular;  Laterality: Left;   REVISON OF ARTERIOVENOUS FISTULA Left 09/28/2013   Procedure: EXCISE ESCHAR LEFT ARM  ARTERIOVENOUS FISTULA WITH PLICATION OF LEFT ARM ARTERIOVENOUS FISTULA;  Surgeon: Richrd Char, MD;  Location: [facility] OR;  Service: Vascular;  Laterality: Left;   REVISON OF ARTERIOVENOUS FISTULA Left 08/26/2015   Procedure: RESECTION ANEURYSM OF LEFT ARM ARTERIOVENOUS FISTULA  ;  Surgeon: Margherita Shell, MD;  Location: [facility] OR;  Service: Vascular;  Laterality: Left;   ROBOTIC ASSISTED BILATERAL SALPINGO OOPHERECTOMY N/A 07/11/2022   Procedure: XI ROBOTIC ASSISTED BILATERAL SALPINGO OOPHORECTOMY;  Surgeon: Suzi Essex, MD;  Location: [facility] ORS;  Service: Gynecology;  Laterality: N/A;   SIMPLE MASTECTOMY WITH AXILLARY SENTINEL NODE BIOPSY Bilateral 08/22/2021   Procedure: BILATERAL SIMPLE MASTECTOMY;  Surgeon: Sim Dryer, MD;  Location: [facility] OR;  Service: General;  Laterality: Bilateral;   TUBAL LIGATION      Allergies:  Allergies  Allergen Reactions   Docetaxel  Nausea Only    Diaphoretic.  Patient had hypersensitivity reaction to docetaxel .  See progress note from 05/24/2021.  Patient able to  complete infusion.   Latex Hives   Norvasc  [Amlodipine ] Swelling   Tape Other (See Comments)    Burns skin.  Please use paper tape only    Medications:   Prior to Admission medications   Medication Sig Start Date End Date Taking? Authorizing Provider  Accu-Chek Softclix Lancets lancets Use as instructed Patient taking differently: 1 each by Other route See admin instructions. Use as instructed 08/03/20   Gwyndolyn Lerner, MD  aspirin  EC 81 MG tablet Take 81 mg by mouth daily. 08/01/21   [provider]  Blood Glucose Monitoring Suppl (ACCU-CHEK AVIVA CONNECT) w/Device KIT Check sugar 1x daily Patient taking differently: 1 each by  Other route See admin instructions. Check sugar 1x daily 08/03/20   Gwyndolyn Lerner, MD  carvedilol  (COREG ) 25 MG tablet Take 25 mg by mouth 2 (two) times daily with a meal. 11/23/15   [provider]  cholecalciferol  (VITAMIN D3) 25 MCG (1000 UNIT) tablet Take 1,000 Units by mouth daily.    [provider]  cyanocobalamin  1000 MCG tablet Take 1,000 mcg by mouth daily.    [provider]  DROPLET PEN NEEDLES 31G X 8 MM MISC  03/30/22   [provider]  ENVARSUS  XR 1 MG TB24 Take 4 mg by mouth daily. 08/22/22   [provider]  escitalopram  (LEXAPRO ) 10 MG tablet Take 10 mg by mouth daily. 03/08/21   [provider]  furosemide  (LASIX ) 40 MG tablet Take 40 mg by mouth 2 (two) times daily as needed for fluid.    [provider]  gabapentin  (NEURONTIN ) 300 MG capsule Take 2 capsules (600 mg total) by mouth 2 (two) times daily. 05/16/23   Boscia, Heather E, NP  Garlic 100 MG TABS Take 100 mg by mouth daily.    [provider]  glucose blood (ACCU-CHEK AVIVA PLUS) test strip Check sugar 1x daily Patient taking differently: 1 each by Other route See admin instructions. Check sugar 1x daily 08/03/20   Gwyndolyn Lerner, MD  hydrALAZINE  (APRESOLINE ) 25 MG tablet Take 25 mg by mouth 2 (two) times daily. 05/11/22    [provider]  HYDROcodone -acetaminophen  (NORCO/VICODIN) 5-325 MG tablet Take 1-2 tablets by mouth every 4 (four) hours as needed. 10/03/23   Hershel Los, MD  insulin  glargine (LANTUS  SOLOSTAR) 100 UNIT/ML Solostar Pen Inject 25-30 Units into the skin See admin instructions. Inject 30 units into the skin in the morning and 25 units at bedtime    [provider]  Insulin  Pen Needle (PEN NEEDLES) 30G X 8 MM MISC 1 each by Does not apply route daily. E11.9 09/01/19   Gwyndolyn Lerner, MD  magnesium  oxide (MAG-OX) 400 MG tablet Take 400 mg by mouth 2 (two) times daily.    [provider]  multivitamin-lutein (OCUVITE-LUTEIN) CAPS capsule Take 1 capsule by mouth daily.    [provider]  mycophenolate  (MYFORTIC ) 180 MG EC tablet Take 360 mg by mouth 2 (two) times daily.    [provider]  ondansetron  (ZOFRAN ) 8 MG tablet Take 8 mg by mouth 2 (two) times daily.    [provider]  pantoprazole  (PROTONIX ) 40 MG tablet Take 1 tablet (40 mg total) by mouth 2 (two) times daily. 07/27/22   Mansouraty, Albino Alu., MD  predniSONE  (DELTASONE ) 5 MG tablet Take 5 mg by mouth daily.  01/04/17   [provider]  promethazine  (PHENERGAN ) 12.5 MG tablet Take 1 tablet (12.5 mg total) by mouth every 8 (eight) hours as needed for nausea. 10/22/23   Mansouraty, Albino Alu., MD  rosuvastatin  (CRESTOR ) 5 MG tablet Take 5 mg by mouth at bedtime.    [provider]  senna-docusate (SENOKOT-S) 8.6-50 MG tablet Take 2 tablets by mouth at bedtime. For AFTER surgery, do not take if having diarrhea 07/06/22   Vira Grieves D, NP  tamoxifen  (NOLVADEX ) 20 MG tablet TAKE 1 TABLET(20 MG) BY MOUTH DAILY 03/18/23   Burton, [name] K, NP  tirzepatide [facility]) 12.5 MG/0.5ML Pen Inject 12.5 mg into the skin once a week. 02/01/22   [provider]  Vibegron (GEMTESA) 75 MG TABS Take 75 mg by mouth daily.    [provider]  Discontinued Meds:    Medications Discontinued During This Encounter  Medication Reason   insulin  aspart (novoLOG ) injection 0-5 Units    tacrolimus  ER (ENVARSUS  XR) tablet TB24 4 mg    Accu-Chek Softclix Lancets lancets    Blood Glucose Monitoring Suppl (ACCU-CHEK AVIVA CONNECT) w/Device KIT    DROPLET PEN NEEDLES 31G X 8 MM MISC    glucose blood (ACCU-CHEK AVIVA PLUS) test strip    Insulin  Pen Needle (PEN NEEDLES) 30G X 8 MM MISC    senna-docusate (SENOKOT-S) 8.6-50 MG tablet One time medication   ENVARSUS  XR 1 MG TB24 Duplicate   Vibegron (GEMTESA) 75 MG TABS Discontinued by provider    Social History:  reports that she has never smoked. She has never used smokeless tobacco. She reports that she does not currently use alcohol. She reports that she does not use drugs.  Family History:   Family History  Problem Relation Age of Onset   Diabetes Mother    Kidney disease Mother    Diabetes Father    Heart disease Father    Kidney disease Father    Hypertension Father    Diabetes Sister    Asthma Sister    Lupus Sister    Diabetes Brother    Diabetes Brother    Diabetes Brother    Diabetes Brother    Cancer Maternal Grandmother 50       ovarian cancer   Ovarian cancer Maternal Grandmother    Diabetes Maternal Grandfather    Diabetes Paternal Grandmother    Colon cancer Neg Hx    Colon polyps Neg Hx    Esophageal cancer Neg Hx    Rectal cancer Neg Hx    Stomach cancer Neg Hx    Breast cancer Neg Hx    Endometrial cancer Neg Hx    Pancreatic cancer Neg Hx    Prostate cancer Neg Hx    Inflammatory bowel disease Neg Hx    Liver disease Neg Hx     Blood pressure (!) 160/71, pulse 74, temperature 98 F (36.7 C), temperature source Oral, resp. rate 19, height 5\' 1"  (1.549 m), weight 70 kg, SpO2 100%. Physical Exam: GEN: NAD, A&Ox3, NCAT HEENT: No conjunctival pallor, EOMI NECK: Supple, no thyromegaly LUNGS: CTA B/L no rales, rhonchi or wheezing CV: RRR, No M/R/G ABD: SNDNT +BS,   transplant kidney in the RLK, no tenderness to palpation EXT: No lower extremity edema ACCESS: Left brachiocephalic fistula good bruit     Mathhew Buysse, Alveda Aures, MD 10/25/2023, 8:46 AM

## 2023-10-26 LAB — TACROLIMUS LEVEL: Tacrolimus (FK506) - LabCorp: 10.3 ng/mL (ref 5.0–20.0)

## 2023-10-28 ENCOUNTER — Other Ambulatory Visit: Payer: Self-pay | Admitting: Nurse Practitioner

## 2023-10-29 ENCOUNTER — Telehealth: Payer: Self-pay | Admitting: Nurse Practitioner

## 2023-10-29 ENCOUNTER — Encounter: Admitting: Gastroenterology

## 2023-10-29 NOTE — Telephone Encounter (Signed)
 Left the patient a voicemail with the appointment details. The patient will be mailed an appointment reminder.

## 2023-10-30 ENCOUNTER — Ambulatory Visit (INDEPENDENT_AMBULATORY_CARE_PROVIDER_SITE_OTHER): Payer: Medicare HMO | Admitting: Ophthalmology

## 2023-10-30 ENCOUNTER — Encounter (INDEPENDENT_AMBULATORY_CARE_PROVIDER_SITE_OTHER): Payer: Self-pay | Admitting: Ophthalmology

## 2023-10-30 VITALS — BP 154/81 | HR 67

## 2023-10-30 DIAGNOSIS — Z7985 Long-term (current) use of injectable non-insulin antidiabetic drugs: Secondary | ICD-10-CM | POA: Diagnosis not present

## 2023-10-30 DIAGNOSIS — E113553 Type 2 diabetes mellitus with stable proliferative diabetic retinopathy, bilateral: Secondary | ICD-10-CM

## 2023-10-30 DIAGNOSIS — Z794 Long term (current) use of insulin: Secondary | ICD-10-CM | POA: Diagnosis not present

## 2023-10-30 DIAGNOSIS — Z961 Presence of intraocular lens: Secondary | ICD-10-CM

## 2023-10-30 DIAGNOSIS — I10 Essential (primary) hypertension: Secondary | ICD-10-CM | POA: Diagnosis not present

## 2023-10-30 DIAGNOSIS — H35033 Hypertensive retinopathy, bilateral: Secondary | ICD-10-CM

## 2023-11-13 ENCOUNTER — Other Ambulatory Visit: Payer: Self-pay | Admitting: Nurse Practitioner

## 2023-11-13 ENCOUNTER — Inpatient Hospital Stay (HOSPITAL_BASED_OUTPATIENT_CLINIC_OR_DEPARTMENT_OTHER): Admitting: Nurse Practitioner

## 2023-11-13 ENCOUNTER — Inpatient Hospital Stay: Attending: Nurse Practitioner

## 2023-11-13 VITALS — BP 110/58 | HR 68 | Temp 97.3°F | Resp 17 | Wt 158.8 lb

## 2023-11-13 DIAGNOSIS — C50312 Malignant neoplasm of lower-inner quadrant of left female breast: Secondary | ICD-10-CM

## 2023-11-13 DIAGNOSIS — Z79899 Other long term (current) drug therapy: Secondary | ICD-10-CM | POA: Insufficient documentation

## 2023-11-13 DIAGNOSIS — Z170 Estrogen receptor positive status [ER+]: Secondary | ICD-10-CM | POA: Diagnosis not present

## 2023-11-13 DIAGNOSIS — Z1732 Human epidermal growth factor receptor 2 negative status: Secondary | ICD-10-CM | POA: Diagnosis not present

## 2023-11-13 DIAGNOSIS — N61 Mastitis without abscess: Secondary | ICD-10-CM | POA: Diagnosis not present

## 2023-11-13 DIAGNOSIS — Z1721 Progesterone receptor positive status: Secondary | ICD-10-CM | POA: Diagnosis not present

## 2023-11-13 LAB — CBC WITH DIFFERENTIAL (CANCER CENTER ONLY)
Abs Immature Granulocytes: 0.04 10*3/uL (ref 0.00–0.07)
Basophils Absolute: 0 10*3/uL (ref 0.0–0.1)
Basophils Relative: 1 %
Eosinophils Absolute: 0.2 10*3/uL (ref 0.0–0.5)
Eosinophils Relative: 3 %
HCT: 32.8 % — ABNORMAL LOW (ref 36.0–46.0)
Hemoglobin: 11.3 g/dL — ABNORMAL LOW (ref 12.0–15.0)
Immature Granulocytes: 1 %
Lymphocytes Relative: 29 %
Lymphs Abs: 1.6 10*3/uL (ref 0.7–4.0)
MCH: 30.3 pg (ref 26.0–34.0)
MCHC: 34.5 g/dL (ref 30.0–36.0)
MCV: 87.9 fL (ref 80.0–100.0)
Monocytes Absolute: 0.4 10*3/uL (ref 0.1–1.0)
Monocytes Relative: 7 %
Neutro Abs: 3.3 10*3/uL (ref 1.7–7.7)
Neutrophils Relative %: 59 %
Platelet Count: 149 10*3/uL — ABNORMAL LOW (ref 150–400)
RBC: 3.73 MIL/uL — ABNORMAL LOW (ref 3.87–5.11)
RDW: 12 % (ref 11.5–15.5)
WBC Count: 5.6 10*3/uL (ref 4.0–10.5)
nRBC: 0 % (ref 0.0–0.2)

## 2023-11-13 LAB — CMP (CANCER CENTER ONLY)
ALT: 22 U/L (ref 0–44)
AST: 16 U/L (ref 15–41)
Albumin: 4.1 g/dL (ref 3.5–5.0)
Alkaline Phosphatase: 112 U/L (ref 38–126)
Anion gap: 5 (ref 5–15)
BUN: 40 mg/dL — ABNORMAL HIGH (ref 6–20)
CO2: 31 mmol/L (ref 22–32)
Calcium: 10.5 mg/dL — ABNORMAL HIGH (ref 8.9–10.3)
Chloride: 99 mmol/L (ref 98–111)
Creatinine: 1.97 mg/dL — ABNORMAL HIGH (ref 0.44–1.00)
GFR, Estimated: 29 mL/min — ABNORMAL LOW (ref >60)
Glucose, Bld: 400 mg/dL — ABNORMAL HIGH (ref 70–99)
Potassium: 5 mmol/L (ref 3.5–5.1)
Sodium: 135 mmol/L (ref 135–145)
Total Bilirubin: 0.4 mg/dL (ref 0.0–1.2)
Total Protein: 6.2 g/dL — ABNORMAL LOW (ref 6.5–8.1)

## 2023-11-13 MED ORDER — MUPIROCIN 2 % EX OINT: TOPICAL_OINTMENT | CUTANEOUS | 0 refills | Status: AC

## 2023-11-13 MED ORDER — TAMOXIFEN CITRATE 20 MG PO TABS: 20.0000 mg | ORAL_TABLET | Freq: Every day | ORAL | 5 refills | Status: AC

## 2023-11-13 MED ORDER — DOXYCYCLINE HYCLATE 100 MG PO TABS: 100.0000 mg | ORAL_TABLET | Freq: Two times a day (BID) | ORAL | 0 refills | Status: AC

## 2023-11-13 MED ORDER — TRIAMCINOLONE ACETONIDE 0.5 % EX OINT: TOPICAL_OINTMENT | CUTANEOUS | 0 refills | Status: AC

## 2023-11-13 NOTE — Progress Notes (Signed)
 Patient Care Team: Hershell Lose, NP as PCP - General (Nurse Practitioner) Sheryle Donning, MD as PCP - Cardiology (Cardiology) Charletta Cons, MD as Consulting Physician (Nephrology) Patrick Boor, MD as Referring Physician (Nephrology) Sonja [place], MD as Consulting Physician (Hematology) Sim Dryer, MD as Consulting Physician (General Surgery) Debbie Fails, Laura Polio, NP as Nurse Practitioner (Hematology and Oncology) Neda Balk, RN as Registered Nurse  Clinic Day:  11/24/2023  Referring physician: Hershell Lose, NP  ASSESSMENT & PLAN:   Assessment & Plan: Malignant neoplasm of lower-inner quadrant of left breast in female, estrogen receptor positive (HCC)  Stage IA, 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 Ki-67 of 20%. Oncotype showed high risk. -She began adjuvant TC on 05/02/21 after delayed wound healing. She tolerated C1 poorly with nausea with vomiting and diarrhea. She was able to recover well. She experienced cystitis following C2 and C3. She managed to complete 4 cycles, but she has had a slow recovery. -she started exemestane  in 08/2021. She has developed worsening joint pains on exemestane  and switched to tamoxifen  in 04/2022 -continue surveillance, no mammogram s/p bilateral mastectomy. -[name] appears stable. Tolerating tamoxifen  well overall. She has 2 month h/o tenderness at the mastectomy sites, sporadic ttp to the ribs without palpable abnormality. We reviewed this could be surgical pain vs masked pain from lifting grandchildren, vs other.  -chest/rib Xray was offered, but she had to leave for another appointment. Will come back for Xray at a later date.  -was recommended she hold Tamoxifen  to see if this is joint pain from medication.  -If pain resolves, will change to alternative anti-estrogen. If pain does not  improve, will likely restart tamoxifen  and proceed with further imaging -she did go for x-ray of the ribs on 05/08/2023.  - Ultrasound left breast for further evaluation of cellulitis.  Will see patient back in 4 weeks to review results.   Left breast cellulitis Redness, warmth, and tenderness along the lateral aspect of left breast.  Small open lesion present with evidence of slight crusted drainage.  Slight fine, red raised rash also present.  No palpable lump or masses present.  Will start doxycycline  100 mg twice daily.  Add Bactroban  ointment to be applied to affected area twice daily.  She will get ultrasound of the area of left breast for further evaluation.  Plan: Reviewed labs. - Mild and stable iron deficiency anemia with Hgb 11.3 and HCT 32.8. -Moderately elevated blood sugar of 400.  Decreased renal functions with BUN 40 and serum creatinine 1.97.  GFR was 29.  She does follow-up with primary care for management of her diabetes. Antibiotics and  topical medications for her to care for likely cellulitis of left breast.  Ultrasound ordered for further evaluation. New prescription for tamoxifen  25 mg daily. Plan to follow-up with patient in 4 weeks to review results.  The patient understands the plans discussed today and is in agreement with them.  She knows to contact our office if she develops concerns prior to her next appointment.  I provided 25 minutes of face-to-face time during this encounter and > 50% was spent counseling as documented under my assessment and plan.    Sharyon Deis, NP  [facility] - A DEPT OF [facility] [address] [place] [address]  Dept: [phone] Dept Fax: [phone]   Orders Placed This Encounter  Procedures   US  LIMITED ULTRASOUND INCLUDING AXILLA LEFT BREAST     Standing Status:   Future    Expected Date:   11/20/2023    Expiration Date:   11/12/2024    Reason for  Exam (SYMPTOM  OR DIAGNOSIS REQUIRED):   inflammation of left mastectomy scar with tenderness. left axillary lymph node enlargement.    Preferred Imaging Location?:   [facility]      CHIEF COMPLAINT:  CC: left breast cancer, estrogen receptor positive   Current Treatment:   tamoxifen  daily since 04/2022 (previously on exemestane  08/2021 - 04/2022)   INTERVAL HISTORY:  Cynthia is here today for repeat clinical assessment. The patient was last seen by me 05/16/2023. She ahd been complaining of pain in the right rib cage area. X-ray was negative. She had continued discomfort in the right axillary region, but more significant, now having tenderness along the lateral region of the left breast.  Will order new ultrasound of the left breast for further evaluation.  She has not noticed any new masses or lumps, just the tenderness.  There is some redness and warmth associated with area of tenderness of the left breast.  She did restart tamoxifen  and is doing okay with it.  She denies chest pain, chest pressure, or shortness of breath.  She denies headaches or visual disturbances. She states she did denies abdominal pain, nausea, vomiting, or changes in bowel or bladder habits.  She denies fevers or chills. She denies pain. Her appetite is good. Her weight has decreased . 4 pounds over last 2 weeks.  I have reviewed the past medical history, past surgical history, social history and family history with the patient and they are unchanged from previous note.  ALLERGIES:  is allergic to latex, docetaxel , norvasc  [amlodipine ], and tape.  MEDICATIONS:  Current Outpatient Medications  Medication Sig Dispense Refill   aspirin  EC 81 MG tablet Take 81 mg by mouth daily.     carvedilol  (COREG ) 25 MG tablet Take 25 mg by mouth 2 (two) times daily with a meal.     cholecalciferol  (VITAMIN D3) 25 MCG (1000 UNIT) tablet Take 1,000 Units by mouth daily.     cyanocobalamin  1000 MCG tablet Take 1,000 mcg by  mouth daily. (Patient not taking: Reported on 11/20/2023)     doxycycline  (VIBRA -TABS) 100 MG tablet Take 1 tablet (100 mg total) by mouth 2 (two) times daily. 20 tablet 0   escitalopram  (LEXAPRO ) 10 MG tablet Take 10 mg by mouth daily. (Patient not taking: Reported on 11/20/2023)     furosemide  (LASIX ) 40 MG tablet Take 80 mg by mouth 2 (two) times daily as needed for fluid or edema.     gabapentin  (NEURONTIN ) 300 MG capsule TAKE 2 CAPSULES(600 MG) BY MOUTH TWICE DAILY 120 capsule 1   Garlic 100 MG TABS Take 100 mg by mouth daily.     hydrALAZINE  (APRESOLINE ) 25 MG tablet Take 25 mg by mouth 2 (two) times daily. (Patient not taking: Reported on 11/20/2023)     HYDROcodone -acetaminophen  (NORCO/VICODIN) 5-325 MG tablet Take 1-2 tablets by mouth every 4 (four) hours as needed. (Patient taking differently: Take 1-2 tablets by mouth every 4 (four) hours as needed for moderate pain (pain score 4-6) or severe pain (pain score 7-10).) 12 tablet 0   insulin  glargine (LANTUS  SOLOSTAR) 100 UNIT/ML Solostar Pen Inject 30 Units into the skin 2 (two) times daily.  magnesium  oxide (MAG-OX) 400 MG tablet Take 400 mg by mouth 2 (two) times daily.     methocarbamol  (ROBAXIN ) 500 MG tablet Take 500 mg by mouth every 8 (eight) hours as needed.     multivitamin-lutein (OCUVITE-LUTEIN) CAPS capsule Take 1 capsule by mouth daily.     mupirocin  ointment (BACTROBAN ) 2 % Apply to effected areas bid for 7 days 22 g 0   mycophenolate  (MYFORTIC ) 180 MG EC tablet Take 360 mg by mouth 2 (two) times daily.     ondansetron  (ZOFRAN ) 8 MG tablet Take 8 mg by mouth every 8 (eight) hours as needed for nausea, vomiting or refractory nausea / vomiting.     pantoprazole  (PROTONIX ) 40 MG tablet Take 1 tablet (40 mg total) by mouth 2 (two) times daily. 60 tablet 11   predniSONE  (DELTASONE ) 5 MG tablet Take 5 mg by mouth daily.   2   promethazine  (PHENERGAN ) 12.5 MG tablet Take 1 tablet (12.5 mg total) by mouth every 8 (eight) hours as  needed for nausea. 60 tablet 0   rosuvastatin  (CRESTOR ) 5 MG tablet Take 5 mg by mouth at bedtime.     tacrolimus  (PROGRAF ) 1 MG capsule Take 2-3 mg by mouth 2 (two) times daily. Take three capsules in the morning. Take two capsules in the evening.     tamoxifen  (NOLVADEX ) 20 MG tablet Take 1 tablet (20 mg total) by mouth daily. (Patient not taking: Reported on 11/20/2023) 30 tablet 5   triamcinolone  ointment (KENALOG ) 0.5 % Apply to effected areas bid prn itching 30 g 0   mirtazapine (REMERON) 7.5 MG tablet Take 7.5 mg by mouth at bedtime.     No current facility-administered medications for this visit.    HISTORY OF PRESENT ILLNESS:   Oncology History Overview Note  Cancer Staging Malignant neoplasm of lower-inner quadrant of left breast in female, estrogen receptor positive (HCC) Staging form: Breast, AJCC 8th Edition - Clinical stage from 02/09/2021: Stage IA (cT1c, cN0, cM0, G2, ER+, PR+, HER2-) - Signed by Sonja Clarion, MD on 02/20/2021 Stage prefix: Initial diagnosis Histologic grading system: 3 grade system - Pathologic stage from 03/02/2021: Stage IA (pT2, pN0, cM0, G2, ER+, PR+, HER2-, Oncotype DX score: 26) - Signed by Sonja [place], MD on 03/21/2021 Stage prefix: Initial diagnosis Multigene prognostic tests performed: Oncotype DX Recurrence score range: Greater than or equal to 11 Histologic grading system: 3 grade system Residual tumor (R): R0 - None    Malignant neoplasm of lower-inner quadrant of left breast in female, estrogen receptor positive (HCC)  02/07/2021 Mammogram   Exam: Left Diagnostic Mammogram; Left Breast Ultrasound  IMPRESSION: Irregular mass in left breast at 6:30, measuring 1.6 cm by mammogram, is highly suggestive of malignancy. Left axillary ultrasound is negative for lymphadenopathy.   02/09/2021 Cancer Staging   Staging form: Breast, AJCC 8th Edition - Clinical stage from 02/09/2021: Stage IA (cT1c, cN0, cM0, G2, ER+, PR+, HER2-) - Signed by Sonja [place], MD on  02/20/2021 Stage prefix: Initial diagnosis Histologic grading system: 3 grade system   02/09/2021 Pathology Results   Diagnosis Breast, left, needle core biopsy, 6:30 o'clock 8cm fn - INVASIVE DUCTAL CARCINOMA WITH HISTIOCYTOID FEATURES. SEE NOTE Diagnosis Note Carcinoma measures 1 cm in greatest linear dimension and appears grade 2.  PROGNOSTIC INDICATORS Results: IMMUNOHISTOCHEMICAL AND MORPHOMETRIC ANALYSIS PERFORMED MANUALLY The tumor cells are EQUIVOCAL for Her2 (2+). Her2 by FISH will be performed and results performed separately. Estrogen Receptor: >95%, POSITIVE, STRONG STAINING INTENSITY Progesterone Receptor: 90%, POSITIVE, STRONG  STAINING INTENSITY Proliferation Marker Ki67: 20%  FLUORESCENCE IN-SITU HYBRIDIZATION Results: GROUP 5: HER2 **NEGATIVE** Equivocal form of amplification of the HER2 gene was detected in the IHC 2+ tissue sample received from this individual. HER2 FISH was performed by a technologist and cell imaging and analysis on the [employer]. RATIO OF HER2/CEN17 SIGNALS 1.30 AVERAGE HER2 COPY NUMBER PER CELL 1.75   02/15/2021 Initial Diagnosis   Malignant neoplasm of lower-inner quadrant of left breast in female, estrogen receptor positive (HCC)   03/01/2021 Genetic Testing   Positive genetic testing: pathogenic variant detected in BRCA2 at [id number].  No other pathogenic variants detected in [employer] [facility] Panel.  The report date is 03/19/2021.  The CustomNext-Cancer+RNAinsight panel offered by [employer] includes sequencing and rearrangement analysis for the following 47 genes:  APC, ATM, AXIN2, BARD1, BMPR1A, BRCA1, BRCA2, BRIP1, CDH1, CDK4, CDKN2A, CHEK2, DICER1, EPCAM, GREM1, HOXB13, MEN1, MLH1, MSH2, MSH3, MSH6, MUTYH, NBN, NF1, NF2, NTHL1, PALB2, PMS2, POLD1, POLE, PTEN, RAD51C, RAD51D, RECQL, RET, SDHA, SDHAF2, SDHB, SDHC, SDHD, SMAD4, SMARCA4, STK11, TP53, TSC1, TSC2, and VHL.  RNA data is routinely analyzed for use in variant interpretation for  all genes.   03/02/2021 Cancer Staging   Staging form: Breast, AJCC 8th Edition - Pathologic stage from 03/02/2021: Stage IA (pT2, pN0, cM0, G2, ER+, PR+, HER2-, Oncotype DX score: 26) - Signed by Sonja [place], MD on 03/21/2021 Stage prefix: Initial diagnosis Multigene prognostic tests performed: Oncotype DX Recurrence score range: Greater than or equal to 11 Histologic grading system: 3 grade system Residual tumor (R): R0 - None   05/02/2021 - 07/12/2021 Chemotherapy   Patient is on Treatment Plan : BREAST TC q21d      08/22/2021 Pathology Results   FINAL MICROSCOPIC DIAGNOSIS:   A. BREAST, LEFT, MASTECTOMY:  - Fibrocystic changes.  - Lumpectomy scar with hemosiderin deposition.  - No malignancy identified.   B. BREAST, RIGHT, MASTECTOMY:  - Fibrocystic changes with focal fibroadenomatoid change.  - No malignancy identified.    08/2021 -  Anti-estrogen oral therapy   Exemestane  x 6 months, discontinued due to arthralgias and changed to Tamoxifen  in 04/2022   07/11/2022 Surgery   BSO with Dr. Orvil Bland: benign bilateral fallopian tubes and ovaries. (Pt already s/p TAH)       REVIEW OF SYSTEMS:   Constitutional: Denies fevers, chills or abnormal weight loss Eyes: Denies blurriness of vision Ears, nose, mouth, throat, and face: Denies mucositis or sore throat Respiratory: Denies cough, dyspnea or wheezes Cardiovascular: Denies palpitation, chest discomfort or lower extremity swelling Gastrointestinal:  Denies nausea, heartburn or change in bowel habits Skin: Denies abnormal skin rashes Lymphatics: Denies new lymphadenopathy or easy bruising Neurological:Denies numbness, tingling or new weaknesses Behavioral/Psych: Mood is stable, no new changes  Breast: Area of redness and tenderness along the lateral aspect of the left breast.  No new lump or mass noted.  No axillary lymphadenopathy. All other systems were reviewed with the patient and are negative.   VITALS:   Today's Vitals    11/13/23 1247 11/13/23 1257  BP: (!) 110/58   Pulse: 68   Resp: 17   Temp: (!) 97.3 F (36.3 C)   TempSrc: Temporal   SpO2: 99%   Weight: 158 lb 12.8 oz (72 kg)   PainSc:  0-No pain   Body mass index is 30 kg/m.    Wt Readings from Last 3 Encounters:  11/20/23 154 lb (69.9 kg)  11/13/23 158 lb 12.8 oz (72 kg)  10/23/23 154 lb 5.2 oz (  70 kg)    Body mass index is 30 kg/m.  Performance status (ECOG): 1 - Symptomatic but completely ambulatory  PHYSICAL EXAM:   GENERAL:alert, no distress and comfortable SKIN: skin color, texture, turgor are normal, no rashes or significant lesions EYES: normal, Conjunctiva are pink and non-injected, sclera clear OROPHARYNX:no exudate, no erythema and lips, buccal mucosa, and tongue normal  NECK: supple, thyroid  normal size, non-tender, without nodularity LYMPH:  no palpable lymphadenopathy in the cervical, axillary or inguinal LUNGS: clear to auscultation and percussion with normal breathing effort HEART: regular rate & rhythm and no murmurs and no lower extremity edema ABDOMEN:abdomen soft, non-tender and normal bowel sounds Musculoskeletal:no cyanosis of digits and no clubbing  NEURO: alert & oriented x 3 with fluent speech, no focal motor/sensory deficits BREAST: There is redness and warmth healing lateral aspect of left breast.  No palpable mass or lump noted today.  Small, open area, evidence of crusted drainage present.  No left axillary lymphadenopathy noted.  No nipple inversion or nipple discharge.  Right breast is without palpable lump or mass.  There is no nipple inversion or nipple discharge.  There is no axillary lymphadenopathy on the right.  LABORATORY DATA:  I have reviewed the data as listed    Component Value Date/Time   NA 135 11/13/2023 1153   NA 136 02/27/2019 1152   K 5.0 11/13/2023 1153   CL 99 11/13/2023 1153   CO2 31 11/13/2023 1153   GLUCOSE 400 (H) 11/13/2023 1153   BUN 40 (H) 11/13/2023 1153   BUN 19  02/27/2019 1152   CREATININE 1.97 (H) 11/13/2023 1153   CALCIUM  10.5 (H) 11/13/2023 1153   PROT 6.2 (L) 11/13/2023 1153   ALBUMIN  4.1 11/13/2023 1153   AST 16 11/13/2023 1153   ALT 22 11/13/2023 1153   ALKPHOS 112 11/13/2023 1153   BILITOT 0.4 11/13/2023 1153   GFRNONAA 29 (L) 11/13/2023 1153   GFRAA >60 06/06/2019 0250    Lab Results  Component Value Date   WBC 5.6 11/13/2023   NEUTROABS 3.3 11/13/2023   HGB 11.3 (L) 11/13/2023   HCT 32.8 (L) 11/13/2023   MCV 87.9 11/13/2023   PLT 149 (L) 11/13/2023     RADIOGRAPHIC STUDIES: Fluorescein  Angiography Optos (Transit OS) Result Date: 10/30/2023 Right Eye Progression has been stable. Early phase findings include staining, window defect, microaneurysm, vascular perfusion defect. Mid/Late phase findings include staining, window defect, microaneurysm, vascular perfusion defect (No NV, no leakage). Left Eye Progression has been stable. Early phase findings include staining, window defect, microaneurysm, vascular perfusion defect. Mid/Late phase findings include staining, window defect, microaneurysm, vascular perfusion defect (No NV, no leakage). 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 Central Foveal Thickness: 251. Progression has been stable. Findings include normal foveal contour, no IRF, no SRF, outer retinal atrophy (No DME; +ORA corresponding to laser changes -- stable). Left Eye Quality was good. Central Foveal Thickness: 231. Progression has been stable. Findings include normal foveal contour, no IRF, no SRF, outer retinal atrophy (No DME; patches of ORA corresponding to laser changes, +ERM). Notes *Images captured and stored on drive Diagnosis / Impression: OD: No DME; +ORA corresponding to laser changes OS: No DME; patches of ORA corresponding to laser changes, +ERM Clinical management: See  below Abbreviations: NFP - Normal foveal profile. CME - cystoid macular edema.  PED - pigment epithelial detachment. IRF - intraretinal fluid. SRF - subretinal fluid. EZ - ellipsoid zone. ERM - epiretinal membrane. ORA - outer retinal atrophy. ORT - outer retinal tubulation. SRHM - subretinal hyper-reflective material. IRHM - intraretinal hyper-reflective material

## 2023-11-13 NOTE — Assessment & Plan Note (Addendum)
 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 Ki-67 of 20%. Oncotype showed high risk. -She began adjuvant TC on 05/02/21 after delayed wound healing. She tolerated C1 poorly with nausea with vomiting and diarrhea. She was able to recover well. She experienced cystitis following C2 and C3. She managed to complete 4 cycles, but she has had a slow recovery. -she started exemestane  in 08/2021. She has developed worsening joint pains on exemestane  and switched to tamoxifen  in 04/2022 -continue surveillance, no mammogram s/p bilateral mastectomy. -Cynthia [name] appears stable. Tolerating tamoxifen  well overall. She has 2 month h/o tenderness at the mastectomy sites, sporadic ttp to the ribs without palpable abnormality. We reviewed this could be surgical pain vs masked pain from lifting grandchildren, vs other.  -chest/rib Xray was offered, but she had to leave for another appointment. Will come back for Xray at a later date.  -was recommended she hold Tamoxifen  to see if this is joint pain from medication.  -If pain resolves, will change to alternative anti-estrogen. If pain does not improve, will likely restart tamoxifen  and proceed with further imaging -she did go for x-ray of the ribs on 05/08/2023.  - Ultrasound left breast for further evaluation of cellulitis.  Will see patient back in 4 weeks to review results.

## 2023-11-20 ENCOUNTER — Encounter: Payer: Self-pay | Admitting: Gastroenterology

## 2023-11-20 ENCOUNTER — Ambulatory Visit: Admitting: Gastroenterology

## 2023-11-20 ENCOUNTER — Telehealth: Payer: Self-pay | Admitting: Gastroenterology

## 2023-11-20 VITALS — BP 125/49 | HR 69 | Temp 97.9°F | Resp 13 | Ht 61.0 in | Wt 154.0 lb

## 2023-11-20 DIAGNOSIS — K219 Gastro-esophageal reflux disease without esophagitis: Secondary | ICD-10-CM

## 2023-11-20 DIAGNOSIS — B3781 Candidal esophagitis: Secondary | ICD-10-CM | POA: Diagnosis not present

## 2023-11-20 DIAGNOSIS — K2970 Gastritis, unspecified, without bleeding: Secondary | ICD-10-CM

## 2023-11-20 MED ORDER — SODIUM CHLORIDE 0.9 % IV SOLN: 500.0000 mL | Freq: Once | INTRAVENOUS | Status: DC

## 2023-11-20 NOTE — Op Note (Signed)
 [facility] Patient Name: Cynthia [name] Procedure Date: 11/20/2023 2:26 PM MRN: [id number] Endoscopist: Yong Henle , MD, [id number] Age: 53 Referring MD:  Date of Birth: [birth year] Gender: Female Account #: [id number] Procedure:                Upper GI endoscopy Indications:              Anorexia, Nausea with vomiting, Weight loss - (all                            of the symptoms were occurring and patient found to                            have acute on chronic renal insufficiency now                            stabilized) Medicines:                Monitored Anesthesia Care Procedure:                Pre-Anesthesia Assessment:                           - Prior to the procedure, a History and Physical                            was performed, and patient medications and                            allergies were reviewed. The patient's tolerance of                            previous anesthesia was also reviewed. The risks                            and benefits of the procedure and the sedation                            options and risks were discussed with the patient.                            All questions were answered, and informed consent                            was obtained. Prior Anticoagulants: The patient has                            taken no anticoagulant or antiplatelet agents. ASA                            Grade Assessment: III - A patient with severe                            systemic disease. After reviewing the risks and  benefits, the patient was deemed in satisfactory                            condition to undergo the procedure.                           After obtaining informed consent, the endoscope was                            passed under direct vision. Throughout the                            procedure, the patient's blood pressure, pulse, and                            oxygen saturations were monitored  continuously. The                            Olympus Scope [id number] was introduced through the                            mouth, and advanced to the second part of duodenum.                            The upper GI endoscopy was accomplished without                            difficulty. The patient tolerated the procedure. Scope In: Scope Out: Findings:                 White nummular lesions were noted in the entire                            esophagus. Biopsies were taken with a cold forceps                            for histology.                           The Z-line was irregular and was found 38 cm from                            the incisors.                           Striped moderately erythematous mucosa without                            bleeding was found in the entire examined stomach.                            Biopsies were taken with a cold forceps for                            histology and Helicobacter pylori  testing.                           No gross lesions were noted in the duodenal bulb,                            in the first portion of the duodenum and in the                            second portion of the duodenum. Complications:            No immediate complications. Estimated Blood Loss:     Estimated blood loss was minimal. Impression:               - White nummular lesions in esophageal mucosa.                            Biopsied.                           - Z-line irregular, 38 cm from the incisors.                           - Erythematous mucosa in the stomach. Biopsied.                           - No gross lesions in the duodenal bulb, in the                            first portion of the duodenum and in the second                            portion of the duodenum. Recommendation:           - The patient will be observed post-procedure,                            until all discharge criteria are met.                           - Discharge patient to  home.                           - Patient has a contact number available for                            emergencies. The signs and symptoms of potential                            delayed complications were discussed with the                            patient. Return to normal activities tomorrow.                            Written discharge instructions were  provided to the                            patient.                           - High fiber diet.                           - Use FiberCon 1-2 tablets PO daily.                           - Continue present medications.                           - Await pathology results.                           - The findings and recommendations were discussed                            with the patient.                           - The findings and recommendations were discussed                            with the patient's family. Yong Henle, MD 11/20/2023 3:10:48 PM

## 2023-11-20 NOTE — Patient Instructions (Signed)
 Thank you for letting us  take care of your healthcare needs today. Use Fibercon 1-2 tablets daily. Await pathology results.   USTED TUVO UN PROCEDIMIENTO ENDOSCPICO HOY EN EL [facility]:   Lea el informe del procedimiento que se le entreg para cualquier pregunta especfica sobre lo que se Dentist.  Si el informe del examen no responde a sus preguntas, por favor llame a su gastroenterlogo para aclararlo.  Si usted solicit que no se le den [employer] de lo que se Clinical cytogeneticist en su procedimiento al [employer] va a cuidar, entonces el informe del procedimiento se ha incluido en un sobre sellado para que usted lo revise despus cuando le sea ms conveniente.   LO QUE PUEDE ESPERAR: Algunas sensaciones de hinchazn en el abdomen.  Puede tener ms gases de lo normal.  El caminar puede ayudarle a eliminar el aire que se le puso en el tracto gastrointestinal durante el procedimiento y reducir la hinchazn.  Si le hicieron una endoscopia inferior (como una colonoscopia o una sigmoidoscopia flexible), podra notar manchas de sangre en las heces fecales o en el papel higinico.  Si se someti a una preparacin intestinal para su procedimiento, es posible que no tenga una evacuacin intestinal normal durante [employer].   Tenga en cuenta:  Es posible que note un poco de irritacin y congestin en la nariz o algn drenaje.  Esto es debido al oxgeno [employer] durante su procedimiento.  No hay que preocuparse y esto debe desaparecer ms o Regulatory affairs officer.     Despus de la endoscopia superior (EGD)  Vmitos de Retail buyer o material como caf molido   Dolor en el pecho o dolor debajo de los omplatos que antes no tena   Dolor o dificultad persistente para tragar  Falta de aire que antes no tena   Fiebre de 100F o ms  Heces fecales negras y pegajosas   Para asuntos urgentes o de Associate Professor, puede comunicarse con un gastroenterlogo a cualquier hora llamando al [phone].  DIETA:  Recomendamos una comida pequea al principio, pero luego puede continuar con su dieta normal.  Tome muchos lquidos, pero debe evitar las bebidas alcohlicas durante 24 horas.    ACTIVIDAD:  Debe planear tomarse las cosas con calma por el resto del da y no debe CONDUCIR ni usar maquinaria pesada Patent examiner (debido a los medicamentos de sedacin utilizados durante el examen).     SEGUIMIENTO: Nuestro personal llamar al nmero que aparece en su historial al siguiente da hbil de su procedimiento para ver cmo se siente y para responder cualquier pregunta o inquietud que pueda tener con respecto a la informacin que se le dio despus del procedimiento. Si no podemos contactarle, le dejaremos un mensaje.  Sin embargo, si se siente bien y no tiene English as a second language teacher, no es necesario que nos devuelva la llamada.  Asumiremos que ha regresado a sus actividades diarias normales sin incidentes. Si se le tomaron algunas biopsias, le contactaremos por telfono o por carta en las prximas 3 semanas.  Si no ha sabido [employer] biopsias en el transcurso de 3 semanas, por favor llmenos al [phone].   FIRMAS/CONFIDENCIALIDAD: Usted y/o el acompaante que le cuide han firmado documentos que se ingresarn en su historial mdico electrnico.  Estas firmas atestiguan el hecho de que la informacin anterior

## 2023-11-20 NOTE — Progress Notes (Signed)
 Angie with [place] interpreting assisted with DC instructions.

## 2023-11-20 NOTE — Progress Notes (Signed)
 To pacu, VSS. Report to [email or web address].tb

## 2023-11-20 NOTE — Progress Notes (Signed)
 Pt's states no medical or surgical changes since previsit or office visit.

## 2023-11-20 NOTE — Progress Notes (Signed)
 GASTROENTEROLOGY PROCEDURE H&P NOTE   Primary Care Physician: Hershell Lose, NP  HPI: Cynthia [name] is a 53 y.o. female who presents for EGD for evaluation of N/V/Anorexia/Unintentional Weight loss.  Past Medical History:  Diagnosis Date   Allergy    pollen   Anemia    Anxiety    BRCA2 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 03/01/2021   Breast cancer [facility])    Cataract    surgical repair bilateral   Complication of anesthesia    not herself for 2 days after last surgery (brest lumpectomy)   Coronary artery disease    Depression    ESRD on hemodialysis (HCC)    Home HD 5x per week- not on dialysis now had tramsplant 4/17   Family history of ovarian cancer 02/22/2021   GERD (gastroesophageal reflux disease)    Hearing loss 2017   right ear   History of blood transfusion    transfusion reaction   Hyperlipidemia    Hypertension    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 per pt-dr levy also II; Per patient 08/18/21 she is Type 2   Kidney transplant recipient    Leukocytosis 06/13/2021   Neuromuscular disorder (HCC)    NEUROPATHY   Sleep apnea    Past Surgical History:  Procedure Laterality Date   ABDOMINAL HYSTERECTOMY     BREAST EXCISIONAL BIOPSY Right 08/2015   BREAST LUMPECTOMY WITH RADIOACTIVE SEED LOCALIZATION Right 09/14/2015   Procedure: RIGHT BREAST LUMPECTOMY WITH RADIOACTIVE SEED LOCALIZATION;  Surgeon: Dareen Ebbing, MD;  Location: [facility];  Service: General;  Laterality: Right;   BREAST LUMPECTOMY WITH RADIOACTIVE SEED LOCALIZATION Left 03/02/2021   Procedure: LEFT BREAST SEED LUMPECTOMY LEFT SENTINEL LYMPH NODE MAPPING;  Surgeon: Sim Dryer, MD;  Location: [facility];  Service: General;  Laterality: Left;  GEN AND PEC BLOCK   BREAST SURGERY Bilateral    biopsy bilateral   CARDIAC CATHETERIZATION     CATARACT EXTRACTION Bilateral    bilateral   CHOLECYSTECTOMY     CYST  REMOVAL NECK     DIALYSIS FISTULA CREATION Left    EYE SURGERY Bilateral    lazer   kidney transplant     LEFT HEART CATH AND CORONARY ANGIOGRAPHY N/A 03/04/2019   Procedure: LEFT HEART CATH AND CORONARY ANGIOGRAPHY;  Surgeon: Swaziland, Peter M, MD;  Location: [facility];  Service: Cardiovascular;  Laterality: N/A;   LEFT HEART CATHETERIZATION WITH CORONARY ANGIOGRAM N/A 03/30/2014   Procedure: LEFT HEART CATHETERIZATION WITH CORONARY ANGIOGRAM;  Surgeon: Mickiel Albany, MD;  Location: [facility];  Service: Cardiovascular;  Laterality: N/A;   LIPOMA EXCISION N/A 02/06/2017   Procedure: EXCISION POSTERIOR NECK SEBACEOUS CYST;  Surgeon: Oza Blumenthal, MD;  Location: [facility] OR;  Service: General;  Laterality: N/A;   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   Procedure: REPAIR OF ARTERIOVENOUS FISTULA ANEURYSM;  Surgeon: Margherita Shell, MD;  Location: [facility] OR;  Service: Vascular;  Laterality: Left;   REVISON OF ARTERIOVENOUS FISTULA Left 09/28/2013   Procedure: EXCISE ESCHAR LEFT ARM  ARTERIOVENOUS FISTULA WITH PLICATION OF LEFT ARM ARTERIOVENOUS FISTULA;  Surgeon: Richrd Char, MD;  Location: [facility] OR;  Service: Vascular;  Laterality: Left;   REVISON OF ARTERIOVENOUS FISTULA Left 08/26/2015   Procedure: RESECTION ANEURYSM OF LEFT ARM ARTERIOVENOUS FISTULA  ;  Surgeon: Margherita Shell, MD;  Location: [facility] OR;  Service: Vascular;  Laterality: Left;  ROBOTIC ASSISTED BILATERAL SALPINGO OOPHERECTOMY N/A 07/11/2022   Procedure: XI ROBOTIC ASSISTED BILATERAL SALPINGO OOPHORECTOMY;  Surgeon: Suzi Essex, MD;  Location: [facility] ORS;  Service: Gynecology;  Laterality: N/A;   SIMPLE MASTECTOMY WITH AXILLARY SENTINEL NODE BIOPSY Bilateral 08/22/2021   Procedure: BILATERAL SIMPLE MASTECTOMY;  Surgeon: Sim Dryer, MD;  Location: [facility] OR;  Service: General;  Laterality: Bilateral;   TUBAL LIGATION     Current Outpatient Medications  Medication Sig Dispense Refill   aspirin  EC  81 MG tablet Take 81 mg by mouth daily.     carvedilol  (COREG ) 25 MG tablet Take 25 mg by mouth 2 (two) times daily with a meal.     cholecalciferol  (VITAMIN D3) 25 MCG (1000 UNIT) tablet Take 1,000 Units by mouth daily.     cyanocobalamin  1000 MCG tablet Take 1,000 mcg by mouth daily.     doxycycline  (VIBRA -TABS) 100 MG tablet Take 1 tablet (100 mg total) by mouth 2 (two) times daily. 20 tablet 0   escitalopram  (LEXAPRO ) 10 MG tablet Take 10 mg by mouth daily.     furosemide  (LASIX ) 40 MG tablet Take 80 mg by mouth 2 (two) times daily as needed for fluid or edema.     gabapentin  (NEURONTIN ) 300 MG capsule TAKE 2 CAPSULES(600 MG) BY MOUTH TWICE DAILY 120 capsule 1   Garlic 100 MG TABS Take 100 mg by mouth daily.     hydrALAZINE  (APRESOLINE ) 25 MG tablet Take 25 mg by mouth 2 (two) times daily.     HYDROcodone -acetaminophen  (NORCO/VICODIN) 5-325 MG tablet Take 1-2 tablets by mouth every 4 (four) hours as needed. (Patient taking differently: Take 1-2 tablets by mouth every 4 (four) hours as needed for moderate pain (pain score 4-6) or severe pain (pain score 7-10).) 12 tablet 0   insulin  glargine (LANTUS  SOLOSTAR) 100 UNIT/ML Solostar Pen Inject 30 Units into the skin 2 (two) times daily.     magnesium  oxide (MAG-OX) 400 MG tablet Take 400 mg by mouth 2 (two) times daily.     methocarbamol  (ROBAXIN ) 500 MG tablet Take 500 mg by mouth every 8 (eight) hours as needed.     multivitamin-lutein (OCUVITE-LUTEIN) CAPS capsule Take 1 capsule by mouth daily.     mupirocin ointment (BACTROBAN) 2 % Apply to effected areas bid for 7 days 22 g 0   mycophenolate  (MYFORTIC ) 180 MG EC tablet Take 360 mg by mouth 2 (two) times daily.     ondansetron  (ZOFRAN ) 8 MG tablet Take 8 mg by mouth every 8 (eight) hours as needed for nausea, vomiting or refractory nausea / vomiting.     pantoprazole  (PROTONIX ) 40 MG tablet Take 1 tablet (40 mg total) by mouth 2 (two) times daily. 60 tablet 11   predniSONE  (DELTASONE ) 5 MG  tablet Take 5 mg by mouth daily.   2   promethazine  (PHENERGAN ) 12.5 MG tablet Take 1 tablet (12.5 mg total) by mouth every 8 (eight) hours as needed for nausea. 60 tablet 0   rosuvastatin  (CRESTOR ) 5 MG tablet Take 5 mg by mouth at bedtime.     tacrolimus  (PROGRAF ) 1 MG capsule Take 2-3 mg by mouth 2 (two) times daily. Take three capsules in the morning. Take two capsules in the evening.     tamoxifen  (NOLVADEX ) 20 MG tablet Take 1 tablet (20 mg total) by mouth daily. 30 tablet 5   triamcinolone ointment (KENALOG) 0.5 % Apply to effected areas bid prn itching 30 g 0   No current facility-administered medications  for this visit.    Current Outpatient Medications:    aspirin  EC 81 MG tablet, Take 81 mg by mouth daily., Disp: , Rfl:    carvedilol  (COREG ) 25 MG tablet, Take 25 mg by mouth 2 (two) times daily with a meal., Disp: , Rfl:    cholecalciferol  (VITAMIN D3) 25 MCG (1000 UNIT) tablet, Take 1,000 Units by mouth daily., Disp: , Rfl:    cyanocobalamin  1000 MCG tablet, Take 1,000 mcg by mouth daily., Disp: , Rfl:    doxycycline  (VIBRA -TABS) 100 MG tablet, Take 1 tablet (100 mg total) by mouth 2 (two) times daily., Disp: 20 tablet, Rfl: 0   escitalopram  (LEXAPRO ) 10 MG tablet, Take 10 mg by mouth daily., Disp: , Rfl:    furosemide  (LASIX ) 40 MG tablet, Take 80 mg by mouth 2 (two) times daily as needed for fluid or edema., Disp: , Rfl:    gabapentin  (NEURONTIN ) 300 MG capsule, TAKE 2 CAPSULES(600 MG) BY MOUTH TWICE DAILY, Disp: 120 capsule, Rfl: 1   Garlic 100 MG TABS, Take 100 mg by mouth daily., Disp: , Rfl:    hydrALAZINE  (APRESOLINE ) 25 MG tablet, Take 25 mg by mouth 2 (two) times daily., Disp: , Rfl:    HYDROcodone -acetaminophen  (NORCO/VICODIN) 5-325 MG tablet, Take 1-2 tablets by mouth every 4 (four) hours as needed. (Patient taking differently: Take 1-2 tablets by mouth every 4 (four) hours as needed for moderate pain (pain score 4-6) or severe pain (pain score 7-10).), Disp: 12 tablet,  Rfl: 0   insulin  glargine (LANTUS  SOLOSTAR) 100 UNIT/ML Solostar Pen, Inject 30 Units into the skin 2 (two) times daily., Disp: , Rfl:    magnesium  oxide (MAG-OX) 400 MG tablet, Take 400 mg by mouth 2 (two) times daily., Disp: , Rfl:    methocarbamol  (ROBAXIN ) 500 MG tablet, Take 500 mg by mouth every 8 (eight) hours as needed., Disp: , Rfl:    multivitamin-lutein (OCUVITE-LUTEIN) CAPS capsule, Take 1 capsule by mouth daily., Disp: , Rfl:    mupirocin  ointment (BACTROBAN ) 2 %, Apply to effected areas bid for 7 days, Disp: 22 g, Rfl: 0   mycophenolate  (MYFORTIC ) 180 MG EC tablet, Take 360 mg by mouth 2 (two) times daily., Disp: , Rfl:    ondansetron  (ZOFRAN ) 8 MG tablet, Take 8 mg by mouth every 8 (eight) hours as needed for nausea, vomiting or refractory nausea / vomiting., Disp: , Rfl:    pantoprazole  (PROTONIX ) 40 MG tablet, Take 1 tablet (40 mg total) by mouth 2 (two) times daily., Disp: 60 tablet, Rfl: 11   predniSONE  (DELTASONE ) 5 MG tablet, Take 5 mg by mouth daily. , Disp: , Rfl: 2   promethazine  (PHENERGAN ) 12.5 MG tablet, Take 1 tablet (12.5 mg total) by mouth every 8 (eight) hours as needed for nausea., Disp: 60 tablet, Rfl: 0   rosuvastatin  (CRESTOR ) 5 MG tablet, Take 5 mg by mouth at bedtime., Disp: , Rfl:    tacrolimus  (PROGRAF ) 1 MG capsule, Take 2-3 mg by mouth 2 (two) times daily. Take three capsules in the morning. Take two capsules in the evening., Disp: , Rfl:    tamoxifen  (NOLVADEX ) 20 MG tablet, Take 1 tablet (20 mg total) by mouth daily., Disp: 30 tablet, Rfl: 5   triamcinolone  ointment (KENALOG ) 0.5 %, Apply to effected areas bid prn itching, Disp: 30 g, Rfl: 0 Allergies  Allergen Reactions   Docetaxel  Nausea Only    Diaphoretic.  Patient had hypersensitivity reaction to docetaxel .  See progress note from 05/24/2021.  Patient able to complete infusion.   Latex Hives   Norvasc  [Amlodipine ] Swelling   Tape Other (See Comments)    Burns skin.  Please use paper tape only    Family History  Problem Relation Age of Onset   Diabetes Mother    Kidney disease Mother    Diabetes Father    Heart disease Father    Kidney disease Father    Hypertension Father    Diabetes Sister    Asthma Sister    Lupus Sister    Diabetes Brother    Diabetes Brother    Diabetes Brother    Diabetes Brother    Cancer Maternal Grandmother 50       ovarian cancer   Ovarian cancer Maternal Grandmother    Diabetes Maternal Grandfather    Diabetes Paternal Grandmother    Colon cancer Neg Hx    Colon polyps Neg Hx    Esophageal cancer Neg Hx    Rectal cancer Neg Hx    Stomach cancer Neg Hx    Breast cancer Neg Hx    Endometrial cancer Neg Hx    Pancreatic cancer Neg Hx    Prostate cancer Neg Hx    Inflammatory bowel disease Neg Hx    Liver disease Neg Hx    Social History   Socioeconomic History   Marital status: Legally Separated    Spouse name: Not on file   Number of children: 3   Years of education: 9   Highest education level: Not on file  Occupational History   Occupation: disabled  Tobacco Use   Smoking status: Never   Smokeless tobacco: Never  Vaping Use   Vaping status: Never Used  Substance and Sexual Activity   Alcohol use: Not Currently    Comment: Socially   Drug use: No   Sexual activity: Not Currently    Birth control/protection: None, Surgical  Other Topics Concern   Not on file  Social History Narrative   Lives in home with daughters, grand daughter, son-in-law   Caffeine use - 1 cup coffee sometimes    Social Drivers of Corporate investment banker Strain: High Risk (02/22/2021)   Overall Financial Resource Strain (CARDIA)    Difficulty of Paying Living Expenses: Hard  Food Insecurity: No Food Insecurity (10/23/2023)   Hunger Vital Sign    Worried About Running Out of Food in the Last Year: Never true    Ran Out of Food in the Last Year: Never true  Transportation Needs: No Transportation Needs (10/23/2023)   PRAPARE - Therapist, art (Medical): No    Lack of Transportation (Non-Medical): No  Physical Activity: Not on file  Stress: Not on file  Social Connections: Unknown (11/03/2021)   Received from [facility]   Social Network    Social Network: Not on file  Intimate Partner Violence: Not At Risk (10/23/2023)   Humiliation, Afraid, Rape, and Kick questionnaire    Fear of Current or Ex-Partner: No    Emotionally Abused: No    Physically Abused: No    Sexually Abused: No    Physical Exam: There were no vitals filed for this visit. There is no height or weight on file to calculate BMI. GEN: NAD EYE: Sclerae anicteric ENT: MMM CV: Non-tachycardic GI: Soft, NT/ND NEURO:  Alert & Oriented x 3  Lab Results: No results for input(s): "WBC", "HGB", "HCT", "PLT" in the last 72 hours. BMET No results for  input(s): "NA", "K", "CL", "CO2", "GLUCOSE", "BUN", "CREATININE", "CALCIUM " in the last 72 hours. LFT No results for input(s): "PROT", "ALBUMIN ", "AST", "ALT", "ALKPHOS", "BILITOT", "BILIDIR", "IBILI" in the last 72 hours. PT/INR No results for input(s): "LABPROT", "INR" in the last 72 hours.   Impression / Plan: This is a 53 y.o.female who presents for EGD for evaluation of N/V/Anorexia/Unintentional Weight loss.  The risks and benefits of endoscopic evaluation/treatment were discussed with the patient and/or family; these include but are not limited to the risk of perforation, infection, bleeding, missed lesions, lack of diagnosis, severe illness requiring hospitalization, as well as anesthesia and sedation related illnesses.  The patient's history has been reviewed, patient examined, no change in status, and deemed stable for procedure.  The patient and/or family is agreeable to proceed.    Yong Henle, MD [facility] Advanced [facility]oscopy Office # [phone]

## 2023-11-20 NOTE — Telephone Encounter (Signed)
 PT is scheduled for EGD today at 2pm with Dr. Elvin Hammer. She went to [facility] instead. Spoke with Leary Provencal in admissions and she advised that she sent her to our location

## 2023-11-21 ENCOUNTER — Telehealth: Payer: Self-pay

## 2023-11-21 NOTE — Telephone Encounter (Signed)
 Left message

## 2023-11-24 ENCOUNTER — Encounter: Payer: Self-pay | Admitting: Nurse Practitioner

## 2023-11-26 LAB — SURGICAL PATHOLOGY

## 2023-11-27 ENCOUNTER — Ambulatory Visit: Payer: Self-pay | Admitting: Gastroenterology

## 2023-11-28 ENCOUNTER — Other Ambulatory Visit

## 2023-11-29 ENCOUNTER — Other Ambulatory Visit: Payer: Self-pay

## 2023-11-29 MED ORDER — FLUCONAZOLE 100 MG PO TABS: ORAL_TABLET | ORAL | 0 refills | Status: AC

## 2023-12-11 ENCOUNTER — Inpatient Hospital Stay: Attending: Nurse Practitioner | Admitting: Nurse Practitioner

## 2023-12-11 ENCOUNTER — Inpatient Hospital Stay

## 2023-12-11 ENCOUNTER — Telehealth: Payer: Self-pay | Admitting: Nurse Practitioner

## 2023-12-11 NOTE — Progress Notes (Deleted)
 Patient Care Team: Cynthia Lose, NP as PCP - General (Nurse Practitioner) Sheryle Donning, MD as PCP - Cardiology (Cardiology) Charletta Cons, MD as Consulting Physician (Nephrology) Patrick Boor, MD as Referring Physician (Nephrology) Sonja Ellsworth, MD as Consulting Physician (Hematology) Sim Dryer, MD as Consulting Physician (General Surgery) Debbie Fails, Laura Polio, NP as Nurse Practitioner (Hematology and Oncology) Neda Balk, RN as Registered Nurse  Clinic Day:  12/11/2023  Referring physician: Hershell Lose, NP  ASSESSMENT & PLAN:   Assessment & Plan: No problem-specific Assessment & Plan notes found for this encounter.    The patient understands the plans discussed today and is in agreement with them.  She knows to contact our office if she develops concerns prior to her next appointment.  I provided *** minutes of face-to-face time during this encounter and > 50% was spent counseling as documented under my assessment and plan.    Sharyon Deis, NP  [facility] - A DEPT OF [facility] [address] [place] [address] Dept: [phone] Dept Fax: [phone]   No orders of the defined types were placed in this encounter.     CHIEF COMPLAINT:  CC: left breast cancer, estrogen receptor positive   Current Treatment:  tamoxifen  25 mg daily  INTERVAL HISTORY:  Amberlyn is here today for repeat clinical assessment. She denies fevers or chills. She denies pain. Her appetite is good. Her weight {Weight change:10426}.  I have reviewed the past medical history, past surgical history, social history and family history with the patient and they are unchanged from previous note.  ALLERGIES:  is allergic to latex, docetaxel , norvasc  [amlodipine ], and tape.  MEDICATIONS:  Current Outpatient Medications  Medication Sig Dispense Refill   aspirin  EC 81 MG tablet Take 81 mg by mouth daily.      carvedilol  (COREG ) 25 MG tablet Take 25 mg by mouth 2 (two) times daily with a meal.     cholecalciferol  (VITAMIN D3) 25 MCG (1000 UNIT) tablet Take 1,000 Units by mouth daily.     cyanocobalamin  1000 MCG tablet Take 1,000 mcg by mouth daily. (Patient not taking: Reported on 11/20/2023)     doxycycline  (VIBRA -TABS) 100 MG tablet Take 1 tablet (100 mg total) by mouth 2 (two) times daily. 20 tablet 0   escitalopram  (LEXAPRO ) 10 MG tablet Take 10 mg by mouth daily. (Patient not taking: Reported on 11/20/2023)     fluconazole  (DIFLUCAN ) 100 MG tablet Take 2 tablets day 1 Take 1 tablet daily thereafter until gone 15 tablet 0   furosemide  (LASIX ) 40 MG tablet Take 80 mg by mouth 2 (two) times daily as needed for fluid or edema.     gabapentin  (NEURONTIN ) 300 MG capsule TAKE 2 CAPSULES(600 MG) BY MOUTH TWICE DAILY 120 capsule 1   Garlic 100 MG TABS Take 100 mg by mouth daily.     hydrALAZINE  (APRESOLINE ) 25 MG tablet Take 25 mg by mouth 2 (two) times daily. (Patient not taking: Reported on 11/20/2023)     HYDROcodone -acetaminophen  (NORCO/VICODIN) 5-325 MG tablet Take 1-2 tablets by mouth every 4 (four) hours as needed. (Patient taking differently: Take 1-2 tablets by mouth every 4 (four) hours as needed for moderate pain (pain score 4-6) or severe pain (pain score 7-10).) 12 tablet 0   insulin  glargine (LANTUS  SOLOSTAR) 100 UNIT/ML Solostar Pen Inject 30 Units into the skin 2 (two) times daily.     magnesium  oxide (MAG-OX)  400 MG tablet Take 400 mg by mouth 2 (two) times daily.     methocarbamol  (ROBAXIN ) 500 MG tablet Take 500 mg by mouth every 8 (eight) hours as needed.     mirtazapine (REMERON) 7.5 MG tablet Take 7.5 mg by mouth at bedtime.     multivitamin-lutein (OCUVITE-LUTEIN) CAPS capsule Take 1 capsule by mouth daily.     mupirocin  ointment (BACTROBAN ) 2 % Apply to effected areas bid for 7 days 22 g 0   mycophenolate  (MYFORTIC ) 180 MG EC tablet Take 360 mg by mouth 2 (two) times daily.      ondansetron  (ZOFRAN ) 8 MG tablet Take 8 mg by mouth every 8 (eight) hours as needed for nausea, vomiting or refractory nausea / vomiting.     pantoprazole  (PROTONIX ) 40 MG tablet Take 1 tablet (40 mg total) by mouth 2 (two) times daily. 60 tablet 11   predniSONE  (DELTASONE ) 5 MG tablet Take 5 mg by mouth daily.   2   promethazine  (PHENERGAN ) 12.5 MG tablet Take 1 tablet (12.5 mg total) by mouth every 8 (eight) hours as needed for nausea. 60 tablet 0   rosuvastatin  (CRESTOR ) 5 MG tablet Take 5 mg by mouth at bedtime.     tacrolimus  (PROGRAF ) 1 MG capsule Take 2-3 mg by mouth 2 (two) times daily. Take three capsules in the morning. Take two capsules in the evening.     tamoxifen  (NOLVADEX ) 20 MG tablet Take 1 tablet (20 mg total) by mouth daily. (Patient not taking: Reported on 11/20/2023) 30 tablet 5   triamcinolone  ointment (KENALOG ) 0.5 % Apply to effected areas bid prn itching 30 g 0   No current facility-administered medications for this visit.    HISTORY OF PRESENT ILLNESS:   Oncology History Overview Note  Cancer Staging Malignant neoplasm of lower-inner quadrant of left breast in female, estrogen receptor positive (HCC) Staging form: Breast, AJCC 8th Edition - Clinical stage from 02/09/2021: Stage IA (cT1c, cN0, cM0, G2, ER+, PR+, HER2-) - Signed by Sonja Ada, MD on 02/20/2021 Stage prefix: Initial diagnosis Histologic grading system: 3 grade system - Pathologic stage from 03/02/2021: Stage IA (pT2, pN0, cM0, G2, ER+, PR+, HER2-, Oncotype DX score: 26) - Signed by Sonja [place]view, MD on 03/21/2021 Stage prefix: Initial diagnosis Multigene prognostic tests performed: Oncotype DX Recurrence score range: Greater than or equal to 11 Histologic grading system: 3 grade system Residual tumor (R): R0 - None    Malignant neoplasm of lower-inner quadrant of left breast in female, estrogen receptor positive (HCC)  02/07/2021 Mammogram   Exam: Left Diagnostic Mammogram; Left Breast  Ultrasound  IMPRESSION: Irregular mass in left breast at 6:30, measuring 1.6 cm by mammogram, is highly suggestive of malignancy. Left axillary ultrasound is negative for lymphadenopathy.   02/09/2021 Cancer Staging   Staging form: Breast, AJCC 8th Edition - Clinical stage from 02/09/2021: Stage IA (cT1c, cN0, cM0, G2, ER+, PR+, HER2-) - Signed by Sonja Richland, MD on 02/20/2021 Stage prefix: Initial diagnosis Histologic grading system: 3 grade system   02/09/2021 Pathology Results   Diagnosis Breast, left, needle core biopsy, 6:30 o'clock 8cm fn - INVASIVE DUCTAL CARCINOMA WITH HISTIOCYTOID FEATURES. SEE NOTE Diagnosis Note Carcinoma measures 1 cm in greatest linear dimension and appears grade 2.  PROGNOSTIC INDICATORS Results: IMMUNOHISTOCHEMICAL AND MORPHOMETRIC ANALYSIS PERFORMED MANUALLY The tumor cells are EQUIVOCAL for Her2 (2+). Her2 by FISH will be performed and results performed separately. Estrogen Receptor: >95%, POSITIVE, STRONG STAINING INTENSITY Progesterone Receptor: 90%, POSITIVE, STRONG STAINING INTENSITY Proliferation  Marker Ki67: 20%  FLUORESCENCE IN-SITU HYBRIDIZATION Results: GROUP 5: HER2 **NEGATIVE** Equivocal form of amplification of the HER2 gene was detected in the IHC 2+ tissue sample received from this individual. HER2 FISH was performed by a technologist and cell imaging and analysis on the [employer]. RATIO OF HER2/CEN17 SIGNALS 1.30 AVERAGE HER2 COPY NUMBER PER CELL 1.75   02/15/2021 Initial Diagnosis   Malignant neoplasm of lower-inner quadrant of left breast in female, estrogen receptor positive (HCC)   03/01/2021 Genetic Testing   Positive genetic testing: pathogenic variant detected in BRCA2 at [id number].  No other pathogenic variants detected in [facility] Panel.  The report date is 03/19/2021.  The CustomNext-Cancer+RNAinsight panel offered by [employer] includes sequencing and rearrangement analysis for the following 47 genes:  APC,  ATM, AXIN2, BARD1, BMPR1A, BRCA1, BRCA2, BRIP1, CDH1, CDK4, CDKN2A, CHEK2, DICER1, EPCAM, GREM1, HOXB13, MEN1, MLH1, MSH2, MSH3, MSH6, MUTYH, NBN, NF1, NF2, NTHL1, PALB2, PMS2, POLD1, POLE, PTEN, RAD51C, RAD51D, RECQL, RET, SDHA, SDHAF2, SDHB, SDHC, SDHD, SMAD4, SMARCA4, STK11, TP53, TSC1, TSC2, and VHL.  RNA data is routinely analyzed for use in variant interpretation for all genes.   03/02/2021 Cancer Staging   Staging form: Breast, AJCC 8th Edition - Pathologic stage from 03/02/2021: Stage IA (pT2, pN0, cM0, G2, ER+, PR+, HER2-, Oncotype DX score: 26) - Signed by Sonja [place], MD on 03/21/2021 Stage prefix: Initial diagnosis Multigene prognostic tests performed: Oncotype DX Recurrence score range: Greater than or equal to 11 Histologic grading system: 3 grade system Residual tumor (R): R0 - None   05/02/2021 - 07/12/2021 Chemotherapy   Patient is on Treatment Plan : BREAST TC q21d      08/22/2021 Pathology Results   FINAL MICROSCOPIC DIAGNOSIS:   A. BREAST, LEFT, MASTECTOMY:  - Fibrocystic changes.  - Lumpectomy scar with hemosiderin deposition.  - No malignancy identified.   B. BREAST, RIGHT, MASTECTOMY:  - Fibrocystic changes with focal fibroadenomatoid change.  - No malignancy identified.    08/2021 -  Anti-estrogen oral therapy   Exemestane  x 6 months, discontinued due to arthralgias and changed to Tamoxifen  in 04/2022   07/11/2022 Surgery   BSO with Dr. Orvil Bland: benign bilateral fallopian tubes and ovaries. (Pt already s/p TAH)       REVIEW OF SYSTEMS:   Constitutional: Denies fevers, chills or abnormal weight loss Eyes: Denies blurriness of vision Ears, nose, mouth, throat, and face: Denies mucositis or sore throat Respiratory: Denies cough, dyspnea or wheezes Cardiovascular: Denies palpitation, chest discomfort or lower extremity swelling Gastrointestinal:  Denies nausea, heartburn or change in bowel habits Skin: Denies abnormal skin rashes Lymphatics: Denies new  lymphadenopathy or easy bruising Neurological:Denies numbness, tingling or new weaknesses Behavioral/Psych: Mood is stable, no new changes  All other systems were reviewed with the patient and are negative.   VITALS:  There were no vitals taken for this visit.  Wt Readings from Last 3 Encounters:  11/20/23 154 lb (69.9 kg)  11/13/23 158 lb 12.8 oz (72 kg)  10/23/23 154 lb 5.2 oz (70 kg)    There is no height or weight on file to calculate BMI.  Performance status (ECOG): {CHL ONC [id number]  PHYSICAL EXAM:   GENERAL:alert, no distress and comfortable SKIN: skin color, texture, turgor are normal, no rashes or significant lesions EYES: normal, Conjunctiva are pink and non-injected, sclera clear OROPHARYNX:no exudate, no erythema and lips, buccal mucosa, and tongue normal  NECK: supple, thyroid  normal size, non-tender, without nodularity LYMPH:  no palpable lymphadenopathy in the cervical,  axillary or inguinal LUNGS: clear to auscultation and percussion with normal breathing effort HEART: regular rate & rhythm and no murmurs and no lower extremity edema ABDOMEN:abdomen soft, non-tender and normal bowel sounds Musculoskeletal:no cyanosis of digits and no clubbing  NEURO: alert & oriented x 3 with fluent speech, no focal motor/sensory deficits  LABORATORY DATA:  I have reviewed the data as listed    Component Value Date/Time   NA 135 11/13/2023 1153   NA 136 02/27/2019 1152   K 5.0 11/13/2023 1153   CL 99 11/13/2023 1153   CO2 31 11/13/2023 1153   GLUCOSE 400 (H) 11/13/2023 1153   BUN 40 (H) 11/13/2023 1153   BUN 19 02/27/2019 1152   CREATININE 1.97 (H) 11/13/2023 1153   CALCIUM  10.5 (H) 11/13/2023 1153   PROT 6.2 (L) 11/13/2023 1153   ALBUMIN  4.1 11/13/2023 1153   AST 16 11/13/2023 1153   ALT 22 11/13/2023 1153   ALKPHOS 112 11/13/2023 1153   BILITOT 0.4 11/13/2023 1153   GFRNONAA 29 (L) 11/13/2023 1153   GFRAA >60 06/06/2019 0250    No results found for:  SPEP, UPEP  Lab Results  Component Value Date   WBC 5.6 11/13/2023   NEUTROABS 3.3 11/13/2023   HGB 11.3 (L) 11/13/2023   HCT 32.8 (L) 11/13/2023   MCV 87.9 11/13/2023   PLT 149 (L) 11/13/2023      Chemistry      Component Value Date/Time   NA 135 11/13/2023 1153   NA 136 02/27/2019 1152   K 5.0 11/13/2023 1153   CL 99 11/13/2023 1153   CO2 31 11/13/2023 1153   BUN 40 (H) 11/13/2023 1153   BUN 19 02/27/2019 1152   CREATININE 1.97 (H) 11/13/2023 1153      Component Value Date/Time   CALCIUM  10.5 (H) 11/13/2023 1153   ALKPHOS 112 11/13/2023 1153   AST 16 11/13/2023 1153   ALT 22 11/13/2023 1153   BILITOT 0.4 11/13/2023 1153       RADIOGRAPHIC STUDIES: I have personally reviewed the radiological images as listed and agreed with the findings in the report. No results found.

## 2023-12-11 NOTE — Telephone Encounter (Signed)
 Rescheduled appointments per patient request via incoming call. Talked with the patient and she is aware of the changes made to her upcoming appointments.

## 2023-12-12 ENCOUNTER — Ambulatory Visit
Admission: RE | Admit: 2023-12-12 | Discharge: 2023-12-12 | Disposition: A | Discharge status: Home / Self Care | Source: Ambulatory Visit | Attending: Nurse Practitioner | Admitting: Nurse Practitioner

## 2023-12-12 ENCOUNTER — Ambulatory Visit
Admission: RE | Admit: 2023-12-12 | Discharge: 2023-12-12 | Disposition: A | Discharge status: Home / Self Care | Source: Ambulatory Visit | Attending: Nurse Practitioner

## 2023-12-12 DIAGNOSIS — C50312 Malignant neoplasm of lower-inner quadrant of left female breast: Secondary | ICD-10-CM

## 2023-12-12 DIAGNOSIS — Z170 Estrogen receptor positive status [ER+]: Secondary | ICD-10-CM

## 2023-12-19 ENCOUNTER — Other Ambulatory Visit: Payer: Self-pay | Admitting: Nurse Practitioner

## 2023-12-19 DIAGNOSIS — C50312 Malignant neoplasm of lower-inner quadrant of left female breast: Secondary | ICD-10-CM

## 2023-12-19 NOTE — Assessment & Plan Note (Addendum)
 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 Ki-67 of 20%. Oncotype showed high risk. -She began adjuvant TC on 05/02/21 after delayed wound healing. She tolerated C1 poorly with nausea with vomiting and diarrhea. She was able to recover well. She experienced cystitis following C2 and C3. She managed to complete 4 cycles, but she has had a slow recovery. -she started exemestane  in 08/2021. She has developed worsening joint pains on exemestane  and switched to tamoxifen  in 04/2022 -continue surveillance, no mammogram s/p bilateral mastectomy. -Ms. Cynthia [name] appears stable. Tolerating tamoxifen  well overall. She has 2 month h/o tenderness at the mastectomy sites, sporadic ttp to the ribs without palpable abnormality. We reviewed this could be surgical pain vs masked pain from lifting grandchildren, vs other.  -chest/rib Xray was offered, but she had to leave for another appointment. Will come back for Xray at a later date.  -was recommended she hold Tamoxifen  to see if this is joint pain from medication.  -she did go for x-ray of the ribs on 05/08/2023. There was no evidence of bony or other abnormalities.  - Ultrasound left breast for further evaluation of cellulitis.   -- Targeted ultrasound of left and right breasts done on 12/12/2023.  Ultrasound of the left side showed normal tissue along the margins of medial mastectomy scar.  There was no mass or fluid collection.  Targeted ultrasound of the right breast showed normal tissue lateral to the right mastectomy scar and in the right axillary region.  There are no masses.  There were no enlarged or abnormal lymph nodes, bilaterally. -continue tamoxifen  20 mg daily.  -continue breast cancer surveillance with labs and follow up in 6 months, sooner if needed.

## 2023-12-19 NOTE — Progress Notes (Addendum)
 Patient Care Team: Leontine Cramp, NP as PCP - General (Nurse Practitioner) Lonni Slain, MD as PCP - Cardiology (Cardiology) Marlee Bernardino NOVAK, MD as Consulting Physician (Nephrology) Melia Lynwood ORN, MD as Referring Physician (Nephrology) Lanny Callander, MD as Consulting Physician (Hematology) Vanderbilt Ned, MD as Consulting Physician (General Surgery) Crawford, Morna Pickle, NP as Nurse Practitioner (Hematology and Oncology) Starla Wendelyn BIRCH, RN as Registered Nurse  Clinic Day:  12/20/2023  Referring physician: Leontine Cramp, NP  ASSESSMENT & PLAN:   Assessment & Plan: Malignant neoplasm of lower-inner quadrant of left breast in female, estrogen receptor positive (HCC)  Stage IA, 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 Ki-67 of 20%. Oncotype showed high risk. -She began adjuvant TC on 05/02/21 after delayed wound healing. She tolerated C1 poorly with nausea with vomiting and diarrhea. She was able to recover well. She experienced cystitis following C2 and C3. She managed to complete 4 cycles, but she has had a slow recovery. -she started exemestane  in 08/2021. She has developed worsening joint pains on exemestane  and switched to tamoxifen  in 04/2022 -continue surveillance, no mammogram s/p bilateral mastectomy. -[name] appears stable. Tolerating tamoxifen  well overall. She has 2 month h/o tenderness at the mastectomy sites, sporadic ttp to the ribs without palpable abnormality. We reviewed this could be surgical pain vs masked pain from lifting grandchildren, vs other.  -chest/rib Xray was offered, but she had to leave for another appointment. Will come back for Xray at a later date.  -was recommended she hold Tamoxifen  to see if this is joint pain from medication.  -she did go for x-ray of the ribs on 05/08/2023. There was no evidence of bony  or other abnormalities.  - Ultrasound left breast for further evaluation of cellulitis.   -- Targeted ultrasound of left and right breasts done on 12/12/2023.  Ultrasound of the left side showed normal tissue along the margins of medial mastectomy scar.  There was no mass or fluid collection.  Targeted ultrasound of the right breast showed normal tissue lateral to the right mastectomy scar and in the right axillary region.  There are no masses.  There were no enlarged or abnormal lymph nodes, bilaterally. -continue tamoxifen  20 mg daily.  -continue breast cancer surveillance with labs and follow up in 6 months, sooner if needed.   Near syncope  Prior to being seen by provider, the patient went to use the restroom. While there, she became weak, and dizzy. She reported feeling like she was going to lose consciousness. RN was called to the room. Vital signs were taken and were stable, though diastolic blood pressure was slightly low at 46. Heart rate was stable between 65 and 70. Oxygen saturation was 100%. Her blood sugar was checked, as she is insulin  dependant diabetic. It was 269. She is chronic CKD. Has had renal transplant several years ago. Kidney functions risking and GFR falling. Advised she be taken to the emergency department for further evaluation. She was agreeable and was taken to the ED by Anders, RN.   Insulin  dependant diabetes With routine labs today, blood glucose was 335. She states that she took her insulin  as prescribed this morning, but has not eaten very much since then. Reports eating two slices of toast, had chesse, and coffee. POCT glucose was 269 during visit.   Left breast cellulitis Resolved. Quickly reviewed the results of  the bilateral breast imaging with the patient. There was no evidence of continued cellulitis. There were no masses. There was no evidence of metastatic disease.   Plan:  Patient seen along with Dr. Lanny today.  Labs reviewed.  -mild and stable anemia.  Slight decrease platelets.  -rising renal functions with eGFR at 28 today.  Recommended evaluation in ED due to CKD and new development of near-syncope. She agreed and was taken to ED for further evaluation.  If not admitted to hospital, recommend she schedule appointments with nephrology and cardiology. She agreed.  Briefly reviewed targeted ultrasound of the left and right breast with the patient, showing no evidence of new mass or malignancy.  Continue tamoxifen  20 mg daily.  Plan to follow up with labs in 6 months, sooner if needed.    The patient understands the plans discussed today and is in agreement with them.  She knows to contact our office if she develops concerns prior to her next appointment.  I provided 30 minutes of face-to-face time during this encounter and > 50% was spent counseling as documented under my assessment and plan.    Powell FORBES Lessen, NP  [facility] - A DEPT OF [facility] [address] [place] [address] Dept: [phone] Dept Fax: [phone]   No orders of the defined types were placed in this encounter.     CHIEF COMPLAINT:  CC: Left breast cancer, estrogen receptor positive  Current Treatment: Tamoxifen  daily since 04/2022 (previously on exemestane  08/2021-04/2022)  INTERVAL HISTORY:  Cynthia [name] is here today for repeat clinical assessment.  She was last seen by me on 11/13/2023.  Started antibiotics due to, what appeared to be, cellulitis of the left breast.  Targeted ultrasound of left and right breasts done on 12/12/2023.  Ultrasound of the left side showed normal tissue along the margins of medial mastectomy scar.  There was no mass or fluid collection.  Targeted ultrasound of the right breast showed normal tissue lateral to the right mastectomy scar and in the right axillary region.  There are no masses.  There were no enlarged or abnormal lymph nodes, bilaterally.  The patient  did become weak and very dizzy during her appointment. She reported feeling this way 3 to 4 times every week. When this happens, she gets very heavy feeling in the back of her head, near the base of the skull. She states that these symptoms started about a month ago. When she is at home, she is able to lie down for a few hours. It will generally resolve. 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 She is on several anti-rejection medicines. She is followed closely by both nephrology and cardiology. She denies chest pain, chest pressure, or shortness of breath. She denies abdominal pain, nausea, vomiting, or changes in bowel or bladder habits.  She denies fevers or chills. She denies pain. Her appetite is good. Her weight has increased 10 pounds over last month.  I have reviewed the past medical history, past surgical history, social history and family history with the patient and they are unchanged from previous note.  ALLERGIES:  is allergic to latex, docetaxel , norvasc  Dax.Dade ], and tape.  MEDICATIONS:  Current Outpatient Medications  Medication Sig Dispense Refill   aspirin  EC 81 MG tablet Take 81 mg by mouth daily.     carvedilol  (COREG ) 25 MG tablet Take 25 mg by mouth 2 (  two) times daily with a meal.     cholecalciferol  (VITAMIN D3) 25 MCG (1000 UNIT) tablet Take 1,000 Units by mouth daily.     cyanocobalamin  1000 MCG tablet Take 1,000 mcg by mouth daily. (Patient not taking: Reported on 11/20/2023)     doxycycline  (VIBRA -TABS) 100 MG tablet Take 1 tablet (100 mg total) by mouth 2 (two) times daily. 20 tablet 0   escitalopram  (LEXAPRO ) 10 MG tablet Take 10 mg by mouth daily. (Patient not taking: Reported on 11/20/2023)     fluconazole  (DIFLUCAN ) 100 MG tablet Take 2 tablets day 1 Take 1 tablet daily thereafter until gone 15 tablet 0   furosemide  (LASIX ) 40 MG tablet Take 80 mg by mouth 2 (two) times daily as needed for fluid or edema.     gabapentin  (NEURONTIN ) 300 MG  capsule TAKE 2 CAPSULES(600 MG) BY MOUTH TWICE DAILY 120 capsule 1   Garlic 100 MG TABS Take 100 mg by mouth daily.     hydrALAZINE  (APRESOLINE ) 25 MG tablet Take 25 mg by mouth 2 (two) times daily. (Patient not taking: Reported on 11/20/2023)     HYDROcodone -acetaminophen  (NORCO/VICODIN) 5-325 MG tablet Take 1-2 tablets by mouth every 4 (four) hours as needed. (Patient taking differently: Take 1-2 tablets by mouth every 4 (four) hours as needed for moderate pain (pain score 4-6) or severe pain (pain score 7-10).) 12 tablet 0   insulin  glargine (LANTUS  SOLOSTAR) 100 UNIT/ML Solostar Pen Inject 30 Units into the skin 2 (two) times daily.     magnesium  oxide (MAG-OX) 400 MG tablet Take 400 mg by mouth 2 (two) times daily.     methocarbamol  (ROBAXIN ) 500 MG tablet Take 500 mg by mouth every 8 (eight) hours as needed.     mirtazapine (REMERON) 7.5 MG tablet Take 7.5 mg by mouth at bedtime.     multivitamin-lutein (OCUVITE-LUTEIN) CAPS capsule Take 1 capsule by mouth daily.     mupirocin  ointment (BACTROBAN ) 2 % Apply to effected areas bid for 7 days 22 g 0   mycophenolate  (MYFORTIC ) 180 MG EC tablet Take 360 mg by mouth 2 (two) times daily.     ondansetron  (ZOFRAN ) 8 MG tablet Take 8 mg by mouth every 8 (eight) hours as needed for nausea, vomiting or refractory nausea / vomiting.     pantoprazole  (PROTONIX ) 40 MG tablet Take 1 tablet (40 mg total) by mouth 2 (two) times daily. 60 tablet 11   predniSONE  (DELTASONE ) 5 MG tablet Take 5 mg by mouth daily.   2   promethazine  (PHENERGAN ) 12.5 MG tablet Take 1 tablet (12.5 mg total) by mouth every 8 (eight) hours as needed for nausea. 60 tablet 0   rosuvastatin  (CRESTOR ) 5 MG tablet Take 5 mg by mouth at bedtime.     tacrolimus  (PROGRAF ) 1 MG capsule Take 2-3 mg by mouth 2 (two) times daily. Take three capsules in the morning. Take two capsules in the evening.     tamoxifen  (NOLVADEX ) 20 MG tablet Take 1 tablet (20 mg total) by mouth daily. (Patient not taking:  Reported on 11/20/2023) 30 tablet 5   triamcinolone  ointment (KENALOG ) 0.5 % Apply to effected areas bid prn itching 30 g 0   No current facility-administered medications for this visit.    HISTORY OF PRESENT ILLNESS:   Oncology History Overview Note  Cancer Staging Malignant neoplasm of lower-inner quadrant of left breast in female, estrogen receptor positive (HCC) Staging form: Breast, AJCC 8th Edition - Clinical stage from 02/09/2021: Stage IA (  cT1c, cN0, cM0, G2, ER+, PR+, HER2-) - Signed by Lanny Callander, MD on 02/20/2021 Stage prefix: Initial diagnosis Histologic grading system: 3 grade system - Pathologic stage from 03/02/2021: Stage IA (pT2, pN0, cM0, G2, ER+, PR+, HER2-, Oncotype DX score: 26) - Signed by Lanny Callander, MD on 03/21/2021 Stage prefix: Initial diagnosis Multigene prognostic tests performed: Oncotype DX Recurrence score range: Greater than or equal to 11 Histologic grading system: 3 grade system Residual tumor (R): R0 - None    Malignant neoplasm of lower-inner quadrant of left breast in female, estrogen receptor positive (HCC)  02/07/2021 Mammogram   Exam: Left Diagnostic Mammogram; Left Breast Ultrasound  IMPRESSION: Irregular mass in left breast at 6:30, measuring 1.6 cm by mammogram, is highly suggestive of malignancy. Left axillary ultrasound is negative for lymphadenopathy.   02/09/2021 Cancer Staging   Staging form: Breast, AJCC 8th Edition - Clinical stage from 02/09/2021: Stage IA (cT1c, cN0, cM0, G2, ER+, PR+, HER2-) - Signed by Lanny Callander, MD on 02/20/2021 Stage prefix: Initial diagnosis Histologic grading system: 3 grade system   02/09/2021 Pathology Results   Diagnosis Breast, left, needle core biopsy, 6:30 o'clock 8cm fn - INVASIVE DUCTAL CARCINOMA WITH HISTIOCYTOID FEATURES. SEE NOTE Diagnosis Note Carcinoma measures 1 cm in greatest linear dimension and appears grade 2.  PROGNOSTIC INDICATORS Results: IMMUNOHISTOCHEMICAL AND MORPHOMETRIC ANALYSIS  PERFORMED MANUALLY The tumor cells are EQUIVOCAL for Her2 (2+). Her2 by FISH will be performed and results performed separately. Estrogen Receptor: >95%, POSITIVE, STRONG STAINING INTENSITY Progesterone Receptor: 90%, POSITIVE, STRONG STAINING INTENSITY Proliferation Marker Ki67: 20%  FLUORESCENCE IN-SITU HYBRIDIZATION Results: GROUP 5: HER2 **NEGATIVE** Equivocal form of amplification of the HER2 gene was detected in the IHC 2+ tissue sample received from this individual. HER2 FISH was performed by a technologist and cell imaging and analysis on the [employer]. RATIO OF HER2/CEN17 SIGNALS 1.30 AVERAGE HER2 COPY NUMBER PER CELL 1.75   02/15/2021 Initial Diagnosis   Malignant neoplasm of lower-inner quadrant of left breast in female, estrogen receptor positive (HCC)   03/01/2021 Genetic Testing   Positive genetic testing: pathogenic variant detected in BRCA2 at [id number].  No other pathogenic variants detected in [facility] Custom[employer] Panel.  The report date is 03/19/2021.  The CustomNext-Cancer+RNAinsight panel offered by [employer] includes sequencing and rearrangement analysis for the following 47 genes:  APC, ATM, AXIN2, BARD1, BMPR1A, BRCA1, BRCA2, BRIP1, CDH1, CDK4, CDKN2A, CHEK2, DICER1, EPCAM, GREM1, HOXB13, MEN1, MLH1, MSH2, MSH3, MSH6, MUTYH, NBN, NF1, NF2, NTHL1, PALB2, PMS2, POLD1, POLE, PTEN, RAD51C, RAD51D, RECQL, RET, SDHA, SDHAF2, SDHB, SDHC, SDHD, SMAD4, SMARCA4, STK11, TP53, TSC1, TSC2, and VHL.  RNA data is routinely analyzed for use in variant interpretation for all genes.   03/02/2021 Cancer Staging   Staging form: Breast, AJCC 8th Edition - Pathologic stage from 03/02/2021: Stage IA (pT2, pN0, cM0, G2, ER+, PR+, HER2-, Oncotype DX score: 26) - Signed by Lanny Callander, MD on 03/21/2021 Stage prefix: Initial diagnosis Multigene prognostic tests performed: Oncotype DX Recurrence score range: Greater than or equal to 11 Histologic grading system: 3 grade system Residual tumor (R):  R0 - None   05/02/2021 - 07/12/2021 Chemotherapy   Patient is on Treatment Plan : BREAST TC q21d      08/22/2021 Pathology Results   FINAL MICROSCOPIC DIAGNOSIS:   A. BREAST, LEFT, MASTECTOMY:  - Fibrocystic changes.  - Lumpectomy scar with hemosiderin deposition.  - No malignancy identified.   B. BREAST, RIGHT, MASTECTOMY:  - Fibrocystic changes with focal fibroadenomatoid change.  - No  malignancy identified.    08/2021 -  Anti-estrogen oral therapy   Exemestane  x 6 months, discontinued due to arthralgias and changed to Tamoxifen  in 04/2022   07/11/2022 Surgery   BSO with Dr. Viktoria: benign bilateral fallopian tubes and ovaries. (Pt already s/p TAH)       REVIEW OF SYSTEMS:   Constitutional: Denies fevers, chills or abnormal weight loss. Feels very thirsty. Drinking between 3 and 4 liters of water  every day. Eyes: Denies blurriness of vision Ears, nose, mouth, throat, and face: Denies mucositis or sore throat Respiratory: Denies cough, dyspnea or wheezes Cardiovascular: Denies palpitation, chest discomfort or lower extremity swelling Gastrointestinal:  Denies nausea, heartburn or change in bowel habits Skin: Denies abnormal skin rashes Lymphatics: Denies new lymphadenopathy or easy bruising Neurological:Denies numbness, tingling or new weaknesses. Having 3 to 4 episodes of dizziness per week. Having to lay down for several hours when this occurs.  Behavioral/Psych: Mood is stable, no new changes  All other systems were reviewed with the patient and are negative.   VITALS:   Today's Vitals   12/20/23 1037  BP: (!) 130/46  Pulse: 66  Resp: 17  Temp: 97.6 F (36.4 C)  TempSrc: Temporal  SpO2: 100%  Weight: 164 lb 2 oz (74.4 kg)  Height: 5' 1 (1.549 m)   Body mass index is 31.01 kg/m.   Wt Readings from Last 3 Encounters:  12/20/23 164 lb 2 oz (74.4 kg)  11/20/23 154 lb (69.9 kg)  11/13/23 158 lb 12.8 oz (72 kg)    Body mass index is 31.01  kg/m.  Performance status (ECOG): 2 - Symptomatic, <50% confined to bed  PHYSICAL EXAM:   GENERAL: at times, the patient having difficulty standing or holding her head up. Appeared very weak. Was diaphoretic.  SKIN: skin color, texture, turgor are normal, no rashes or significant lesions EYES: normal, Conjunctiva are pink and non-injected, sclera clear OROPHARYNX:no exudate, no erythema and lips, buccal mucosa, and tongue normal  NECK: supple, thyroid  normal size, non-tender, without nodularity LYMPH:  no palpable lymphadenopathy in the cervical, axillary or inguinal LUNGS: clear to auscultation and percussion with normal breathing effort HEART: regular rate & rhythm and no murmurs and no lower extremity edema ABDOMEN:abdomen soft, non-tender and normal bowel sounds Musculoskeletal:no cyanosis of digits and no clubbing  NEURO: very weak with near syncopal episode today. Patient able to communicate and answer all questions appropriately.   LABORATORY DATA:  I have reviewed the data as listed    Component Value Date/Time   NA 134 (L) 12/20/2023 1021   NA 136 02/27/2019 1152   K 4.1 12/20/2023 1021   CL 98 12/20/2023 1021   CO2 28 12/20/2023 1021   GLUCOSE 335 (H) 12/20/2023 1021   BUN 64 (H) 12/20/2023 1021   BUN 19 02/27/2019 1152   CREATININE 2.07 (H) 12/20/2023 1021   CALCIUM  10.0 12/20/2023 1021   PROT 6.5 12/20/2023 1021   ALBUMIN  4.1 12/20/2023 1021   AST 11 (L) 12/20/2023 1021   ALT 12 12/20/2023 1021   ALKPHOS 110 12/20/2023 1021   BILITOT 0.4 12/20/2023 1021   GFRNONAA 28 (L) 12/20/2023 1021   GFRAA >60 06/06/2019 0250     Lab Results  Component Value Date   WBC 5.1 12/20/2023   NEUTROABS 2.9 12/20/2023   HGB 11.8 (L) 12/20/2023   HCT 35.2 (L) 12/20/2023   MCV 88.2 12/20/2023   PLT 146 (L) 12/20/2023      RADIOGRAPHIC STUDIES: CT Head Wo Contrast  Result Date: 12/20/2023 CLINICAL DATA:  Headache, new onset (Age >= 51y). EXAM: CT HEAD WITHOUT CONTRAST  TECHNIQUE: Contiguous axial images were obtained from the base of the skull through the vertex without intravenous contrast. RADIATION DOSE REDUCTION: This exam was performed according to the departmental dose-optimization program which includes automated exposure control, adjustment of the mA and/or kV according to patient size and/or use of iterative reconstruction technique. COMPARISON:  CT head from 01/04/2015. FINDINGS: Brain: No evidence of acute infarction, hemorrhage, hydrocephalus, extra-axial collection or mass lesion/mass effect. Ventricles are normal. Cerebral volume is age appropriate. Vascular: No hyperdense vessel or unexpected calcification. Intracranial arteriosclerosis. Skull: Normal. Negative for fracture or focal lesion. Sinuses/Orbits: No acute finding. Moderate mucoperiosteal thickening noted in the bilateral ethmoidal air cells. There is complete opacification of the visualized portion of bilateral maxillary sinus. Hypoplastic right frontal sinus is also completely opacified. Other: Visualized mastoid air cells are unremarkable. No mastoid effusion. IMPRESSION: 1. No acute intracranial abnormality. 2. 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 Electronically Signed   By: Ree Molt M.D.   On: 12/20/2023 13:48   CT ABDOMEN PELVIS WO CONTRAST Result Date: 12/20/2023 CLINICAL DATA:  Abdominal pain, acute, nonlocalized nausea, bloating, epigastric pain, early satiety, wt loss, dysphagia. EXAM: CT ABDOMEN AND PELVIS WITHOUT CONTRAST TECHNIQUE: Multidetector CT imaging of the abdomen and pelvis was performed following the standard protocol without IV contrast. RADIATION DOSE REDUCTION: This exam was performed according to the departmental dose-optimization program which includes automated exposure control, adjustment of the mA and/or kV according to patient size and/or use of iterative reconstruction technique. COMPARISON:  CT scan abdomen and pelvis from 10/23/2023. FINDINGS: Lower chest:  There are patchy atelectatic changes in the visualized lung bases. No overt consolidation. No pleural effusion. The heart is normal in size. No pericardial effusion. Hepatobiliary: The liver is normal in size. Non-cirrhotic configuration. No suspicious mass. No intrahepatic or extrahepatic bile duct dilation. Gallbladder is surgically absent. Pancreas: Unremarkable. No pancreatic ductal dilatation or surrounding inflammatory changes. Spleen: Within normal limits. No focal lesion. Adrenals/Urinary Tract: Adrenal glands are unremarkable. Bilateral moderate to severely small/atrophic kidneys noted. No hydroureteronephrosis or nephroureterolithiasis. Right pelvic transplanted kidney noted without hydronephrosis or calculi. Unremarkable urinary bladder. Stomach/Bowel: There is a small sliding hiatal hernia. No disproportionate dilation of the small or large bowel loops. No evidence of abnormal bowel wall thickening or inflammatory changes. The appendix is unremarkable. There are multiple diverticula mainly in the left hemi colon, without imaging signs of diverticulitis. Vascular/Lymphatic: No ascites or pneumoperitoneum. No abdominal or pelvic lymphadenopathy, by size criteria. No aneurysmal dilation of the major abdominal arteries. There are moderate peripheral atherosclerotic vascular calcifications of the aorta and its major branches. Reproductive: The uterus is surgically absent. No large adnexal mass. Other: There is a tiny fat containing umbilical hernia. Infraumbilical right paramedian surgical scar noted. Musculoskeletal: No suspicious osseous lesions. There are mild multilevel degenerative changes in the visualized spine. IMPRESSION: 1. No acute inflammatory process identified within the abdomen or pelvis. 2. Multiple other nonacute observations, as described above. Aortic Atherosclerosis (ICD10-I70.0). Electronically Signed   By: Ree Molt M.D.   On: 12/20/2023 13:46   US  LIMITED ULTRASOUND INCLUDING  AXILLA LEFT BREAST  Result Date: 12/12/2023 CLINICAL DATA:  History of left breast carcinoma treated with lumpectomy in 2022. Patient also underwent a right lumpectomy in 2017. Since she has undergone bilateral mastectomies. She presents with reports of some yellowish drainage from the medial aspect of the left mastectomy scar and focal pain along  the lateral margin of the right mastectomy scar and in the right axilla. EXAM: ULTRASOUND OF THE BILATERAL BREAST COMPARISON:  Prior exams. FINDINGS: On physical exam, there is no visible drainage along the medial aspect of the left mastectomy scar. No underlying mass. No mass is palpated lateral to the right mastectomy scar in the area of focal pain. Targeted left breast ultrasound is performed, showing normal tissue along the margins of the medial mastectomy scar. No mass. No fluid collection. Targeted right breast ultrasound is performed, showing normal tissue lateral to the right mastectomy scar and in the right axilla. No masses. No enlarged or abnormal lymph nodes. IMPRESSION: 1. No evidence of new or recurrent breast malignancy. 2. No abnormality noted to correspond to the area of reported drainage from the medial left mastectomy scar and no abnormality noted in the area of pain lateral to the right mastectomy scar or along the right axilla. RECOMMENDATION: 1. Clinical follow-up for in the recurrent left mastectomy scar drainage. I have discussed the findings and recommendations with the patient. If applicable, a reminder letter will be sent to the patient regarding the next appointment. BI-RADS CATEGORY  1: Negative. Electronically Signed   By: Alm Parkins M.D.   On: 12/12/2023 13:21   US  LIMITED ULTRASOUND INCLUDING AXILLA RIGHT BREAST Result Date: 12/12/2023 CLINICAL DATA:  History of left breast carcinoma treated with lumpectomy in 2022. Patient also underwent a right lumpectomy in 2017. Since she has undergone bilateral mastectomies. She presents with  reports of some yellowish drainage from the medial aspect of the left mastectomy scar and focal pain along the lateral margin of the right mastectomy scar and in the right axilla. EXAM: ULTRASOUND OF THE BILATERAL BREAST COMPARISON:  Prior exams. FINDINGS: On physical exam, there is no visible drainage along the medial aspect of the left mastectomy scar. No underlying mass. No mass is palpated lateral to the right mastectomy scar in the area of focal pain. Targeted left breast ultrasound is performed, showing normal tissue along the margins of the medial mastectomy scar. No mass. No fluid collection. Targeted right breast ultrasound is performed, showing normal tissue lateral to the right mastectomy scar and in the right axilla. No masses. No enlarged or abnormal lymph nodes. IMPRESSION: 1. No evidence of new or recurrent breast malignancy. 2. No abnormality noted to correspond to the area of reported drainage from the medial left mastectomy scar and no abnormality noted in the area of pain lateral to the right mastectomy scar or along the right axilla. RECOMMENDATION: 1. Clinical follow-up for in the recurrent left mastectomy scar drainage. I have discussed the findings and recommendations with the patient. If applicable, a reminder letter will be sent to the patient regarding the next appointment. BI-RADS CATEGORY  1: Negative. Electronically Signed   By: Alm Parkins M.D.   On: 12/12/2023 13:21   Addendum I have seen the patient, examined her. I agree with the assessment and and plan and have edited the notes.   Patient came in for routine breast cancer follow-up today.  She complains of intermittent dizziness when she stands, and had a near syncope episode in our office.  She has orthostatic hypotension.  I reviewed her medical records and the medications list, she is on high doses of Lasix , blood glucose has been high, likely dehydrated.  Due to her severe symptoms, will send her to emergency room for  further evaluation.  Will reschedule her f/u with us .   Onita Mattock MD 12/20/2023

## 2023-12-20 ENCOUNTER — Ambulatory Visit: Payer: Self-pay | Admitting: Gastroenterology

## 2023-12-20 ENCOUNTER — Other Ambulatory Visit: Payer: Self-pay

## 2023-12-20 ENCOUNTER — Inpatient Hospital Stay (HOSPITAL_BASED_OUTPATIENT_CLINIC_OR_DEPARTMENT_OTHER): Admitting: Nurse Practitioner

## 2023-12-20 ENCOUNTER — Emergency Department (HOSPITAL_COMMUNITY)
Admission: EM | Admit: 2023-12-20 | Discharge: 2023-12-20 | Disposition: A | Discharge status: Home / Self Care | Attending: Emergency Medicine | Admitting: Emergency Medicine

## 2023-12-20 ENCOUNTER — Emergency Department (HOSPITAL_COMMUNITY)

## 2023-12-20 ENCOUNTER — Inpatient Hospital Stay

## 2023-12-20 VITALS — BP 130/46 | HR 66 | Temp 97.6°F | Resp 17 | Ht 61.0 in | Wt 164.1 lb

## 2023-12-20 DIAGNOSIS — R140 Abdominal distension (gaseous): Secondary | ICD-10-CM

## 2023-12-20 DIAGNOSIS — N189 Chronic kidney disease, unspecified: Secondary | ICD-10-CM | POA: Diagnosis not present

## 2023-12-20 DIAGNOSIS — Z1721 Progesterone receptor positive status: Secondary | ICD-10-CM | POA: Insufficient documentation

## 2023-12-20 DIAGNOSIS — Z794 Long term (current) use of insulin: Secondary | ICD-10-CM | POA: Insufficient documentation

## 2023-12-20 DIAGNOSIS — Z91040 Latex allergy status: Secondary | ICD-10-CM | POA: Insufficient documentation

## 2023-12-20 DIAGNOSIS — Z7982 Long term (current) use of aspirin: Secondary | ICD-10-CM | POA: Insufficient documentation

## 2023-12-20 DIAGNOSIS — C50312 Malignant neoplasm of lower-inner quadrant of left female breast: Secondary | ICD-10-CM | POA: Insufficient documentation

## 2023-12-20 DIAGNOSIS — R1010 Upper abdominal pain, unspecified: Secondary | ICD-10-CM

## 2023-12-20 DIAGNOSIS — N186 End stage renal disease: Secondary | ICD-10-CM | POA: Diagnosis not present

## 2023-12-20 DIAGNOSIS — Z1732 Human epidermal growth factor receptor 2 negative status: Secondary | ICD-10-CM | POA: Diagnosis not present

## 2023-12-20 DIAGNOSIS — I2510 Atherosclerotic heart disease of native coronary artery without angina pectoris: Secondary | ICD-10-CM | POA: Diagnosis not present

## 2023-12-20 DIAGNOSIS — N61 Mastitis without abscess: Secondary | ICD-10-CM | POA: Diagnosis not present

## 2023-12-20 DIAGNOSIS — Z940 Kidney transplant status: Secondary | ICD-10-CM | POA: Diagnosis not present

## 2023-12-20 DIAGNOSIS — R6881 Early satiety: Secondary | ICD-10-CM

## 2023-12-20 DIAGNOSIS — E1122 Type 2 diabetes mellitus with diabetic chronic kidney disease: Secondary | ICD-10-CM | POA: Insufficient documentation

## 2023-12-20 DIAGNOSIS — R42 Dizziness and giddiness: Secondary | ICD-10-CM | POA: Diagnosis present

## 2023-12-20 DIAGNOSIS — E860 Dehydration: Secondary | ICD-10-CM | POA: Insufficient documentation

## 2023-12-20 DIAGNOSIS — R110 Nausea: Secondary | ICD-10-CM

## 2023-12-20 DIAGNOSIS — I120 Hypertensive chronic kidney disease with stage 5 chronic kidney disease or end stage renal disease: Secondary | ICD-10-CM | POA: Insufficient documentation

## 2023-12-20 DIAGNOSIS — R55 Syncope and collapse: Secondary | ICD-10-CM | POA: Diagnosis not present

## 2023-12-20 DIAGNOSIS — Z79810 Long term (current) use of selective estrogen receptor modulators (SERMs): Secondary | ICD-10-CM | POA: Diagnosis not present

## 2023-12-20 DIAGNOSIS — R634 Abnormal weight loss: Secondary | ICD-10-CM

## 2023-12-20 DIAGNOSIS — Z170 Estrogen receptor positive status [ER+]: Secondary | ICD-10-CM | POA: Insufficient documentation

## 2023-12-20 DIAGNOSIS — Z853 Personal history of malignant neoplasm of breast: Secondary | ICD-10-CM | POA: Diagnosis not present

## 2023-12-20 DIAGNOSIS — Z79899 Other long term (current) drug therapy: Secondary | ICD-10-CM | POA: Insufficient documentation

## 2023-12-20 DIAGNOSIS — K219 Gastro-esophageal reflux disease without esophagitis: Secondary | ICD-10-CM

## 2023-12-20 LAB — URINALYSIS, ROUTINE W REFLEX MICROSCOPIC
Bilirubin Urine: NEGATIVE
Glucose, UA: NEGATIVE mg/dL
Hgb urine dipstick: NEGATIVE
Ketones, ur: NEGATIVE mg/dL
Leukocytes,Ua: NEGATIVE
Nitrite: NEGATIVE
Protein, ur: NEGATIVE mg/dL
Specific Gravity, Urine: 1.008 (ref 1.005–1.030)
pH: 5 (ref 5.0–8.0)

## 2023-12-20 LAB — CMP (CANCER CENTER ONLY)
ALT: 12 U/L (ref 0–44)
AST: 11 U/L — ABNORMAL LOW (ref 15–41)
Albumin: 4.1 g/dL (ref 3.5–5.0)
Alkaline Phosphatase: 110 U/L (ref 38–126)
Anion gap: 8 (ref 5–15)
BUN: 64 mg/dL — ABNORMAL HIGH (ref 6–20)
CO2: 28 mmol/L (ref 22–32)
Calcium: 10 mg/dL (ref 8.9–10.3)
Chloride: 98 mmol/L (ref 98–111)
Creatinine: 2.07 mg/dL — ABNORMAL HIGH (ref 0.44–1.00)
GFR, Estimated: 28 mL/min — ABNORMAL LOW (ref >60)
Glucose, Bld: 335 mg/dL — ABNORMAL HIGH (ref 70–99)
Potassium: 4.1 mmol/L (ref 3.5–5.1)
Sodium: 134 mmol/L — ABNORMAL LOW (ref 135–145)
Total Bilirubin: 0.4 mg/dL (ref 0.0–1.2)
Total Protein: 6.5 g/dL (ref 6.5–8.1)

## 2023-12-20 LAB — CBC WITH DIFFERENTIAL (CANCER CENTER ONLY)
Abs Immature Granulocytes: 0.01 10*3/uL (ref 0.00–0.07)
Basophils Absolute: 0 10*3/uL (ref 0.0–0.1)
Basophils Relative: 1 %
Eosinophils Absolute: 0.1 10*3/uL (ref 0.0–0.5)
Eosinophils Relative: 3 %
HCT: 35.2 % — ABNORMAL LOW (ref 36.0–46.0)
Hemoglobin: 11.8 g/dL — ABNORMAL LOW (ref 12.0–15.0)
Immature Granulocytes: 0 %
Lymphocytes Relative: 31 %
Lymphs Abs: 1.6 10*3/uL (ref 0.7–4.0)
MCH: 29.6 pg (ref 26.0–34.0)
MCHC: 33.5 g/dL (ref 30.0–36.0)
MCV: 88.2 fL (ref 80.0–100.0)
Monocytes Absolute: 0.4 10*3/uL (ref 0.1–1.0)
Monocytes Relative: 8 %
Neutro Abs: 2.9 10*3/uL (ref 1.7–7.7)
Neutrophils Relative %: 57 %
Platelet Count: 146 10*3/uL — ABNORMAL LOW (ref 150–400)
RBC: 3.99 MIL/uL (ref 3.87–5.11)
RDW: 11.7 % (ref 11.5–15.5)
WBC Count: 5.1 10*3/uL (ref 4.0–10.5)
nRBC: 0 % (ref 0.0–0.2)

## 2023-12-20 LAB — MAGNESIUM: Magnesium: 2.2 mg/dL (ref 1.7–2.4)

## 2023-12-20 LAB — PHOSPHORUS: Phosphorus: 3.8 mg/dL (ref 2.5–4.6)

## 2023-12-20 MED ORDER — LACTATED RINGERS IV BOLUS
1000.0000 mL | Freq: Once | INTRAVENOUS | Status: AC
  Administered 2023-12-20: 1000 mL via INTRAVENOUS

## 2023-12-20 NOTE — ED Triage Notes (Signed)
 Pt arrives from cancer center for orthostatic hypotension. Elevated BUN/creatinine, low sodium. CBG 264. BP went from 117/70 to 98/58 with no change in HR, hr as 65. Spanish speaking

## 2023-12-20 NOTE — ED Provider Notes (Signed)
**Cynthia [name] De-Identified via Obfuscation**  Cynthia Cynthia [name]   CSN: [id number] Arrival date & time: 12/20/23  1144     Patient presents with: orthostatic hypotension   Cynthia Cynthia [name] is a 53 y.o. female.   HPI    53 y.o. female with history of diabetes, hypertension, coronary artery disease, mitral regurgitation, ESRD, who is status post renal transplant from 2017, currently not on dialysis.  Patient comes into the ER because of dizziness.  Patient had a routine follow-up with oncology today.  While over there, she got dizzy, lightheaded and was advised to come to the ER.  Patient also had blood work done at the cancer center which revealed low sodium.  Patient indicates that for the last several weeks, she has had intermittent episodes of dizziness.  She suspects that she will have dizziness about 3-5 times in a week.  The dizziness is usually unprovoked.  Dizziness is described as feeling lightheaded, and having some spinning sensation.  She normally would lay down in the bed, fall asleep and 3 to 4 hours after when she wakes up, she is typically better.  Patient denies any associated one-sided weakness, numbness, slurred speech, vision changes, difficulty in swallowing.  Patient denies any headaches.  She has been on the same transplant medications.  She has not seen transplant team recently.  Patient now follows up with nephrologist in [place].  Review of system is negative for any fevers, chills.  Patient states that if she was at home, she would have not come to the ER for the dizziness.  Review of system is positive for some headache.  Review of system is positive for headaches.  Patient states that normally with the dizziness, she will have a headache, described as pressure, typically over the vertex.  No neck pain.  Prior to Admission medications   Medication Sig Start Date End Date Taking? Authorizing Provider  aspirin  EC 81 MG tablet Take 81 mg by mouth daily.  08/01/21   [provider]  carvedilol  (COREG ) 25 MG tablet Take 25 mg by mouth 2 (two) times daily with a meal. 11/23/15   [provider]  cholecalciferol  (VITAMIN D3) 25 MCG (1000 UNIT) tablet Take 1,000 Units by mouth daily.    [provider]  cyanocobalamin  1000 MCG tablet Take 1,000 mcg by mouth daily. Patient not taking: Reported on 11/20/2023    [provider]  doxycycline  (VIBRA -TABS) 100 MG tablet Take 1 tablet (100 mg total) by mouth 2 (two) times daily. 11/13/23   Hanford Powell BRAVO, NP  escitalopram  (LEXAPRO ) 10 MG tablet Take 10 mg by mouth daily. Patient not taking: Reported on 11/20/2023 03/08/21   [provider]  fluconazole  (DIFLUCAN ) 100 MG tablet Take 2 tablets day 1 Take 1 tablet daily thereafter until gone 11/29/23   Mansouraty, Aloha Raddle., MD  furosemide  (LASIX ) 40 MG tablet Take 80 mg by mouth 2 (two) times daily as needed for fluid or edema.    [provider]  gabapentin  (NEURONTIN ) 300 MG capsule TAKE 2 CAPSULES(600 MG) BY MOUTH TWICE DAILY 10/29/23   Boscia, Heather E, NP  Garlic 100 MG TABS Take 100 mg by mouth daily.    [provider]  hydrALAZINE  (APRESOLINE ) 25 MG tablet Take 25 mg by mouth 2 (two) times daily. Patient not taking: Reported on 11/20/2023 05/11/22   [provider]  HYDROcodone -acetaminophen  (NORCO/VICODIN) 5-325 MG tablet Take 1-2 tablets by mouth every 4 (four) hours as needed. Patient taking differently: Take  1-2 tablets by mouth every 4 (four) hours as needed for moderate pain (pain score 4-6) or severe pain (pain score 7-10). 10/03/23   Lenor Hollering, MD  insulin  glargine (LANTUS  SOLOSTAR) 100 UNIT/ML Solostar Pen Inject 30 Units into the skin 2 (two) times daily.    [provider]  magnesium  oxide (MAG-OX) 400 MG tablet Take 400 mg by mouth 2 (two) times daily.    [provider]  methocarbamol  (ROBAXIN ) 500 MG tablet Take 500 mg by mouth every 8 (eight) hours as  needed. 08/09/23   [provider]  mirtazapine (REMERON) 7.5 MG tablet Take 7.5 mg by mouth at bedtime. 11/02/23   [provider]  multivitamin-lutein (OCUVITE-LUTEIN) CAPS capsule Take 1 capsule by mouth daily.    [provider]  mupirocin  ointment (BACTROBAN ) 2 % Apply to effected areas bid for 7 days 11/13/23   Hanford Powell BRAVO, NP  mycophenolate  (MYFORTIC ) 180 MG EC tablet Take 360 mg by mouth 2 (two) times daily.    [provider]  ondansetron  (ZOFRAN ) 8 MG tablet Take 8 mg by mouth every 8 (eight) hours as needed for nausea, vomiting or refractory nausea / vomiting.    [provider]  pantoprazole  (PROTONIX ) 40 MG tablet Take 1 tablet (40 mg total) by mouth 2 (two) times daily. 07/27/22   Mansouraty, Aloha Raddle., MD  predniSONE  (DELTASONE ) 5 MG tablet Take 5 mg by mouth daily.  01/04/17   [provider]  promethazine  (PHENERGAN ) 12.5 MG tablet Take 1 tablet (12.5 mg total) by mouth every 8 (eight) hours as needed for nausea. 10/22/23   Mansouraty, Aloha Raddle., MD  rosuvastatin  (CRESTOR ) 5 MG tablet Take 5 mg by mouth at bedtime.    [provider]  tacrolimus  (PROGRAF ) 1 MG capsule Take 2-3 mg by mouth 2 (two) times daily. Take three capsules in the morning. Take two capsules in the evening. 10/12/23   [provider]  tamoxifen  (NOLVADEX ) 20 MG tablet Take 1 tablet (20 mg total) by mouth daily. Patient not taking: Reported on 11/20/2023 11/13/23   Boscia, Heather E, NP  triamcinolone  ointment (KENALOG ) 0.5 % Apply to effected areas bid prn itching 11/13/23   Boscia, Heather E, NP    Allergies: Latex, Docetaxel , Norvasc  [amlodipine ], and Tape    Review of Systems  All other systems reviewed and are negative.   Updated Vital Signs BP (!) 121/58   Pulse 60   Temp 97.7 F (36.5 C) (Oral)   Resp 13   SpO2 98%   Physical Exam Vitals and nursing Cynthia [name] reviewed.  Constitutional:      Appearance: She is well-developed.   HENT:     Head: Atraumatic.   Eyes:     Extraocular Movements: Extraocular movements intact.     Pupils: Pupils are equal, round, and reactive to light.     Comments: Patient has horizontal nystagmus, unilateral, with left gaze  Neck:     Comments: No meningismus Cardiovascular:     Rate and Rhythm: Normal rate.  Pulmonary:     Effort: Pulmonary effort is normal.   Musculoskeletal:     Cervical back: Normal range of motion and neck supple.   Skin:    General: Skin is warm and dry.     Findings: Rash: .mdm4.   Neurological:     Mental Status: She is alert and oriented to person, place, and time.     Cranial Nerves: No cranial nerve deficit.     Sensory:  No sensory deficit.     Motor: No weakness.     Coordination: Coordination normal.     Gait: Gait normal.     Deep Tendon Reflexes: Reflexes normal.     (all labs ordered are listed, but only abnormal results are displayed) Labs Reviewed  URINALYSIS, ROUTINE W REFLEX MICROSCOPIC  MAGNESIUM   PHOSPHORUS    EKG: None  Radiology: CT Head Wo Contrast Result Date: 12/20/2023 CLINICAL DATA:  Headache, new onset (Age >= 51y). EXAM: CT HEAD WITHOUT CONTRAST TECHNIQUE: Contiguous axial images were obtained from the base of the skull through the vertex without intravenous contrast. RADIATION DOSE REDUCTION: This exam was performed according to the departmental dose-optimization program which includes automated exposure control, adjustment of the mA and/or kV according to patient size and/or use of iterative reconstruction technique. COMPARISON:  CT head from 01/04/2015. FINDINGS: Brain: No evidence of acute infarction, hemorrhage, hydrocephalus, extra-axial collection or mass lesion/mass effect. Ventricles are normal. Cerebral volume is age appropriate. Vascular: No hyperdense vessel or unexpected calcification. Intracranial arteriosclerosis. Skull: Normal. Negative for fracture or focal lesion. Sinuses/Orbits: No acute finding.  Moderate mucoperiosteal thickening noted in the bilateral ethmoidal air cells. There is complete opacification of the visualized portion of bilateral maxillary sinus. Hypoplastic right frontal sinus is also completely opacified. Other: Visualized mastoid air cells are unremarkable. No mastoid effusion. IMPRESSION: 1. No acute intracranial abnormality. 2. 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 Electronically Signed   By: Ree Molt M.D.   On: 12/20/2023 13:48   CT ABDOMEN PELVIS WO CONTRAST Result Date: 12/20/2023 CLINICAL DATA:  Abdominal pain, acute, nonlocalized nausea, bloating, epigastric pain, early satiety, wt loss, dysphagia. EXAM: CT ABDOMEN AND PELVIS WITHOUT CONTRAST TECHNIQUE: Multidetector CT imaging of the abdomen and pelvis was performed following the standard protocol without IV contrast. RADIATION DOSE REDUCTION: This exam was performed according to the departmental dose-optimization program which includes automated exposure control, adjustment of the mA and/or kV according to patient size and/or use of iterative reconstruction technique. COMPARISON:  CT scan abdomen and pelvis from 10/23/2023. FINDINGS: Lower chest: There are patchy atelectatic changes in the visualized lung bases. No overt consolidation. No pleural effusion. The heart is normal in size. No pericardial effusion. Hepatobiliary: The liver is normal in size. Non-cirrhotic configuration. No suspicious mass. No intrahepatic or extrahepatic bile duct dilation. Gallbladder is surgically absent. Pancreas: Unremarkable. No pancreatic ductal dilatation or surrounding inflammatory changes. Spleen: Within normal limits. No focal lesion. Adrenals/Urinary Tract: Adrenal glands are unremarkable. Bilateral moderate to severely small/atrophic kidneys noted. No hydroureteronephrosis or nephroureterolithiasis. Right pelvic transplanted kidney noted without hydronephrosis or calculi. Unremarkable urinary bladder. Stomach/Bowel: There is a  small sliding hiatal hernia. No disproportionate dilation of the small or large bowel loops. No evidence of abnormal bowel wall thickening or inflammatory changes. The appendix is unremarkable. There are multiple diverticula mainly in the left hemi colon, without imaging signs of diverticulitis. Vascular/Lymphatic: No ascites or pneumoperitoneum. No abdominal or pelvic lymphadenopathy, by size criteria. No aneurysmal dilation of the major abdominal arteries. There are moderate peripheral atherosclerotic vascular calcifications of the aorta and its major branches. Reproductive: The uterus is surgically absent. No large adnexal mass. Other: There is a tiny fat containing umbilical hernia. Infraumbilical right paramedian surgical scar noted. Musculoskeletal: No suspicious osseous lesions. There are mild multilevel degenerative changes in the visualized spine. IMPRESSION: 1. No acute inflammatory process identified within the abdomen or pelvis. 2. Multiple other nonacute observations, as described above. Aortic Atherosclerosis (ICD10-I70.0). Electronically Signed   By:  Ree Molt M.D.   On: 12/20/2023 13:46     Procedures   Medications Ordered in the ED  lactated ringers  bolus 1,000 mL (0 mLs Intravenous Stopped 12/20/23 1542)                                    Medical Decision Making Amount and/or Complexity of Data Reviewed Labs: ordered. Radiology: ordered.   This patient presents to the ED with chief complaint(s) of dizziness with pertinent past medical history of renal transplant history, CKD, diabetes, hypertension, remote breast cancer history.The complaint involves an extensive differential diagnosis and also carries with it a high risk of complications and morbidity.    The differential diagnosis includes : DDx includes: Central vertigo:  Tumor  Stroke  ICH  Vertebrobasilar TIA  Peripheral Vertigo:  BPPV  Vestibular neuritis  Meniere disease  Migrainous vertigo  Ear  Infection   In the case of this patient, given her headaches, metastatic cancer is in the differential.  Additionally, chronic renal transplant rejection is also a possibility.  The initial plan is to get magnesium , phosphorus.  Patient already had CBC and CMP done at the cancer center.  Will get a UA as well.  CT scan of the brain ordered.   Additional history obtained: Additional history obtained from spouse Records reviewed previous admission documents and oncology notes, care everywhere.  I saw the transplant Cynthia [name] from 2024 March.  Independent labs interpretation:  The following labs were independently interpreted: CMP revealed BUN over 60, creatinine of 2.  A month ago, patient was discharged with creatinine around 2.  She was admitted with creatinine over 4.  Creatinine is still higher compared to last year when she was following of the transplant team.  Patient CMP shows very slight low sodium of 134. UA is normal.  Specific gravity Are normal, ketones are normal.  No DKA.  Independent visualization and interpretation of imaging: - I independently visualized the following imaging with scope of interpretation limited to determining acute life threatening conditions related to emergency care: CT scan of the brain, which revealed no evidence of brain bleed  Treatment and Reassessment: Results of the ED workup discussed with the patient.  Currently she is not dizzy. CT scan of the brain is negative.  I do not think she needs MRI brain.  It does not appear that this is an acute stroke issue.  I have requested the patient follow-up with her nephrologist given the elevated BUN to creatinine ratio.  I am not sure if she is having some symptoms because of her uremia.  Additionally, I would want them to make sure that she does not need to follow-up with transplant team with concerns for chronic rejection.  Patient made aware of this plan and is in agreement with it.  Consultation: -  Consulted or discussed management/test interpretation with external professional: Admission considered, MRI considered, but based on nonfocal neuroexam, low suspicion for stroke as the cause -we do not think MRI brain is needed emergently.  With patient passing oral challenge, labs overall stable and vital signs stable, no need for admission.  Final diagnoses:  Orthostatic dizziness  Dehydration  Chronic kidney disease, unspecified CKD stage    ED Discharge Orders     None          Charlyn Sora, MD 12/20/23 1607

## 2023-12-20 NOTE — Discharge Instructions (Addendum)
 We saw you in the ER for dizziness. The workup in the ER is overall reassuring, besides findings that are concerning for POSSIBLE dehydration.  The workup in the ER is not complete, and is limited to screening for life threatening and emergent conditions only, so please see a primary care doctor for further evaluation and your kidney doctor for repeat renal test. Return to the ER if symptoms worsen.

## 2024-02-20 ENCOUNTER — Institutional Professional Consult (permissible substitution): Admitting: Diagnostic Neuroimaging

## 2024-03-30 ENCOUNTER — Institutional Professional Consult (permissible substitution): Admitting: Diagnostic Neuroimaging

## 2024-06-01 ENCOUNTER — Encounter (HOSPITAL_BASED_OUTPATIENT_CLINIC_OR_DEPARTMENT_OTHER): Payer: Self-pay

## 2024-06-01 ENCOUNTER — Emergency Department (HOSPITAL_BASED_OUTPATIENT_CLINIC_OR_DEPARTMENT_OTHER)
Admission: EM | Admit: 2024-06-01 | Discharge: 2024-06-01 | Disposition: A | Discharge status: Home / Self Care | Attending: Emergency Medicine | Admitting: Emergency Medicine

## 2024-06-01 ENCOUNTER — Other Ambulatory Visit: Payer: Self-pay

## 2024-06-01 ENCOUNTER — Emergency Department (HOSPITAL_BASED_OUTPATIENT_CLINIC_OR_DEPARTMENT_OTHER)

## 2024-06-01 ENCOUNTER — Emergency Department (HOSPITAL_BASED_OUTPATIENT_CLINIC_OR_DEPARTMENT_OTHER): Admitting: Radiology

## 2024-06-01 DIAGNOSIS — Z794 Long term (current) use of insulin: Secondary | ICD-10-CM | POA: Diagnosis not present

## 2024-06-01 DIAGNOSIS — Z79899 Other long term (current) drug therapy: Secondary | ICD-10-CM | POA: Insufficient documentation

## 2024-06-01 DIAGNOSIS — R55 Syncope and collapse: Secondary | ICD-10-CM | POA: Diagnosis present

## 2024-06-01 DIAGNOSIS — R030 Elevated blood-pressure reading, without diagnosis of hypertension: Secondary | ICD-10-CM

## 2024-06-01 DIAGNOSIS — N186 End stage renal disease: Secondary | ICD-10-CM | POA: Diagnosis not present

## 2024-06-01 DIAGNOSIS — Z7982 Long term (current) use of aspirin: Secondary | ICD-10-CM | POA: Diagnosis not present

## 2024-06-01 DIAGNOSIS — R531 Weakness: Secondary | ICD-10-CM | POA: Diagnosis not present

## 2024-06-01 DIAGNOSIS — Z853 Personal history of malignant neoplasm of breast: Secondary | ICD-10-CM | POA: Insufficient documentation

## 2024-06-01 DIAGNOSIS — I120 Hypertensive chronic kidney disease with stage 5 chronic kidney disease or end stage renal disease: Secondary | ICD-10-CM | POA: Diagnosis not present

## 2024-06-01 DIAGNOSIS — I2510 Atherosclerotic heart disease of native coronary artery without angina pectoris: Secondary | ICD-10-CM | POA: Insufficient documentation

## 2024-06-01 DIAGNOSIS — Z603 Acculturation difficulty: Secondary | ICD-10-CM | POA: Insufficient documentation

## 2024-06-01 DIAGNOSIS — Z91040 Latex allergy status: Secondary | ICD-10-CM | POA: Diagnosis not present

## 2024-06-01 DIAGNOSIS — E1122 Type 2 diabetes mellitus with diabetic chronic kidney disease: Secondary | ICD-10-CM | POA: Diagnosis not present

## 2024-06-01 LAB — URINALYSIS, ROUTINE W REFLEX MICROSCOPIC
Bilirubin Urine: NEGATIVE
Glucose, UA: NEGATIVE mg/dL
Hgb urine dipstick: NEGATIVE
Ketones, ur: NEGATIVE mg/dL
Leukocytes,Ua: NEGATIVE
Nitrite: NEGATIVE
Protein, ur: NEGATIVE mg/dL
Specific Gravity, Urine: 1.007 (ref 1.005–1.030)
pH: 5 (ref 5.0–8.0)

## 2024-06-01 LAB — COMPREHENSIVE METABOLIC PANEL WITH GFR
ALT: 9 U/L (ref 0–44)
AST: 15 U/L (ref 15–41)
Albumin: 4.3 g/dL (ref 3.5–5.0)
Alkaline Phosphatase: 124 U/L (ref 38–126)
Anion gap: 14 (ref 5–15)
BUN: 61 mg/dL — ABNORMAL HIGH (ref 6–20)
CO2: 23 mmol/L (ref 22–32)
Calcium: 11.3 mg/dL — ABNORMAL HIGH (ref 8.9–10.3)
Chloride: 96 mmol/L — ABNORMAL LOW (ref 98–111)
Creatinine, Ser: 2.29 mg/dL — ABNORMAL HIGH (ref 0.44–1.00)
GFR, Estimated: 24 mL/min — ABNORMAL LOW (ref >60)
Glucose, Bld: 282 mg/dL — ABNORMAL HIGH (ref 70–99)
Potassium: 4.4 mmol/L (ref 3.5–5.1)
Sodium: 132 mmol/L — ABNORMAL LOW (ref 135–145)
Total Bilirubin: 0.4 mg/dL (ref 0.0–1.2)
Total Protein: 7.1 g/dL (ref 6.5–8.1)

## 2024-06-01 LAB — TROPONIN T, HIGH SENSITIVITY
Troponin T High Sensitivity: 29 ng/L — ABNORMAL HIGH (ref 0–19)
Troponin T High Sensitivity: 26 ng/L — ABNORMAL HIGH (ref 0–19)

## 2024-06-01 LAB — CBC
HCT: 38.4 % (ref 36.0–46.0)
Hemoglobin: 13.4 g/dL (ref 12.0–15.0)
MCH: 28.9 pg (ref 26.0–34.0)
MCHC: 34.9 g/dL (ref 30.0–36.0)
MCV: 82.8 fL (ref 80.0–100.0)
Platelets: 167 K/uL (ref 150–400)
RBC: 4.64 MIL/uL (ref 3.87–5.11)
RDW: 12 % (ref 11.5–15.5)
WBC: 5.5 K/uL (ref 4.0–10.5)
nRBC: 0 % (ref 0.0–0.2)

## 2024-06-01 LAB — MAGNESIUM: Magnesium: 2.3 mg/dL (ref 1.7–2.4)

## 2024-06-01 LAB — RESP PANEL BY RT-PCR (RSV, FLU A&B, COVID)  RVPGX2
Influenza A by PCR: NEGATIVE
Influenza B by PCR: NEGATIVE
Resp Syncytial Virus by PCR: NEGATIVE
SARS Coronavirus 2 by RT PCR: NEGATIVE

## 2024-06-01 LAB — CK: Total CK: 41 U/L (ref 38–234)

## 2024-06-01 LAB — CBG MONITORING, ED: Glucose-Capillary: 270 mg/dL — ABNORMAL HIGH (ref 70–99)

## 2024-06-01 MED ORDER — SODIUM CHLORIDE 0.9 % IV BOLUS
1000.0000 mL | Freq: Once | INTRAVENOUS | Status: AC
  Administered 2024-06-01: 1000 mL via INTRAVENOUS

## 2024-06-01 NOTE — ED Notes (Signed)
 Patient able to stand with assistance during orthostatic VS. Patient endorses dizziness while standing and is able to take a few steps with assistance. She is very unsteady on her feet.

## 2024-06-01 NOTE — ED Provider Notes (Signed)
 [place] EMERGENCY DEPARTMENT AT [facility] Provider Note   CSN: [id number] Arrival date & time: 06/01/24  1438     Patient presents with: Loss of Consciousness   Cynthia [name] is a 53 y.o. female.   Patient with h/o diabetes, hypertension, coronary artery disease, mitral regurgitation, ESRD, who is status post renal transplant from 2017, currently not on dialysis, history of breast cancer currently on surveillance and tamoxifen  --presents to the emergency department today with family member.  Spanish video interpreter utilized for history.  Patient states that she has been feeling very bad.  Despite several attempts to elicit more details, patient does not give specifics on what is making her feel bad.  She states that she thinks that it is from receiving vaccines, flu, COVID, pneumonia 3 weeks ago, but has not been feeling poorly this entire time.  She does report having some burning with urination.  She reports having chills and subjective fever at times, not today but several times over the past week.  Family member at bedside gives additional history.  He states that patient has a history of depression.  On a typical day she is up and out of bed for meals, to watch TV, play with the dog.  Reports that today she has not had anything to eat or drink other than some coffee.  She has not been out of bed except to come to the emergency department.  He states that she had a passing out episode while trying to get into the car, however he was able to get her into the vehicle and drive to the emergency department.  He states that yesterday she seemed to be in her normal state of health.       Prior to Admission medications   Medication Sig Start Date End Date Taking? Authorizing Provider  TRADJENTA 5 MG TABS tablet Take 5 mg by mouth every morning. 03/08/24  Yes [provider]  aspirin  EC 81 MG tablet Take 81 mg by mouth daily. 08/01/21   [provider]   carvedilol  (COREG ) 25 MG tablet Take 25 mg by mouth 2 (two) times daily with a meal. 11/23/15   [provider]  cholecalciferol  (VITAMIN D3) 25 MCG (1000 UNIT) tablet Take 1,000 Units by mouth daily.    [provider]  cyanocobalamin  1000 MCG tablet Take 1,000 mcg by mouth daily. Patient not taking: Reported on 11/20/2023    [provider]  doxycycline  (VIBRA -TABS) 100 MG tablet Take 1 tablet (100 mg total) by mouth 2 (two) times daily. 11/13/23   Boscia, Heather E, NP  escitalopram  (LEXAPRO ) 10 MG tablet Take 10 mg by mouth daily. Patient not taking: Reported on 11/20/2023 03/08/21   [provider]  fluconazole  (DIFLUCAN ) 100 MG tablet Take 2 tablets day 1 Take 1 tablet daily thereafter until gone 11/29/23   Mansouraty, Gabriel Jr., MD  furosemide  (LASIX ) 40 MG tablet Take 80 mg by mouth 2 (two) times daily as needed for fluid or edema.    [provider]  gabapentin  (NEURONTIN ) 300 MG capsule TAKE 2 CAPSULES(600 MG) BY MOUTH TWICE DAILY 10/29/23   Boscia, Heather E, NP  Garlic 100 MG TABS Take 100 mg by mouth daily.    [provider]  hydrALAZINE  (APRESOLINE ) 25 MG tablet Take 25 mg by mouth 2 (two) times daily. Patient not taking: Reported on 11/20/2023 05/11/22   [provider]  HYDROcodone -acetaminophen  (NORCO/VICODIN) 5-325 MG tablet Take 1-2 tablets by mouth every 4 (four)  hours as needed. Patient taking differently: Take 1-2 tablets by mouth every 4 (four) hours as needed for moderate pain (pain score 4-6) or severe pain (pain score 7-10). 10/03/23   Lenor Hollering, MD  insulin  glargine (LANTUS  SOLOSTAR) 100 UNIT/ML Solostar Pen Inject 30 Units into the skin 2 (two) times daily.    [provider]  magnesium  oxide (MAG-OX) 400 MG tablet Take 400 mg by mouth 2 (two) times daily.    [provider]  methocarbamol  (ROBAXIN ) 500 MG tablet Take 500 mg by mouth every 8 (eight) hours as needed. 08/09/23   [provider]  mirtazapine (REMERON) 7.5 MG tablet Take 7.5 mg by mouth at bedtime. 11/02/23   [provider]  multivitamin-lutein (OCUVITE-LUTEIN) CAPS capsule Take 1 capsule by mouth daily.    [provider]  mupirocin  ointment (BACTROBAN ) 2 % Apply to effected areas bid for 7 days 11/13/23   Hanford Powell BRAVO, NP  mycophenolate  (MYFORTIC ) 180 MG EC tablet Take 360 mg by mouth 2 (two) times daily.    [provider]  ondansetron  (ZOFRAN ) 8 MG tablet Take 8 mg by mouth every 8 (eight) hours as needed for nausea, vomiting or refractory nausea / vomiting.    [provider]  pantoprazole  (PROTONIX ) 40 MG tablet Take 1 tablet (40 mg total) by mouth 2 (two) times daily. 07/27/22   Mansouraty, Aloha Raddle., MD  predniSONE  (DELTASONE ) 5 MG tablet Take 5 mg by mouth daily.  01/04/17   [provider]  promethazine  (PHENERGAN ) 12.5 MG tablet Take 1 tablet (12.5 mg total) by mouth every 8 (eight) hours as needed for nausea. 10/22/23   Mansouraty, Aloha Raddle., MD  rosuvastatin  (CRESTOR ) 5 MG tablet Take 5 mg by mouth at bedtime.    [provider]  tacrolimus  (PROGRAF ) 1 MG capsule Take 2-3 mg by mouth 2 (two) times daily. Take three capsules in the morning. Take two capsules in the evening. 10/12/23   [provider]  tamoxifen  (NOLVADEX ) 20 MG tablet Take 1 tablet (20 mg total) by mouth daily. Patient not taking: Reported on 11/20/2023 11/13/23   Boscia, Heather E, NP  triamcinolone  ointment (KENALOG ) 0.5 % Apply to effected areas bid prn itching 11/13/23   Boscia, Heather E, NP    Allergies: Latex, Docetaxel , Norvasc  [amlodipine ], and Tape    Review of Systems  Updated Vital Signs BP (!) 173/68   Pulse 68   Temp (!) 97.5 F (36.4 C) (Oral)   Resp 15   SpO2 99%   Physical Exam Vitals and nursing note reviewed.  Constitutional:      General: She is not in acute distress.    Appearance: She is well-developed.  HENT:     Head:  Normocephalic and atraumatic.     Right Ear: Tympanic membrane, ear canal and external ear normal.     Left Ear: Tympanic membrane, ear canal and external ear normal.     Nose: Nose normal.     Mouth/Throat:     Mouth: Mucous membranes are moist.     Pharynx: Uvula midline.  Eyes:     General: Lids are normal.     Extraocular Movements:     Right eye: No nystagmus.     Left eye: No nystagmus.     Conjunctiva/sclera: Conjunctivae normal.     Pupils: Pupils are equal, round, and reactive to light.  Cardiovascular:     Rate and Rhythm: Normal rate and regular rhythm.  Heart sounds: No murmur heard. Pulmonary:     Effort: Pulmonary effort is normal. No respiratory distress.     Breath sounds: Normal breath sounds. No wheezing, rhonchi or rales.  Abdominal:     Palpations: Abdomen is soft.     Tenderness: There is no abdominal tenderness. There is no guarding or rebound.  Musculoskeletal:     Cervical back: Normal range of motion and neck supple. No tenderness or bony tenderness.     Right lower leg: No edema.     Left lower leg: No edema.  Skin:    General: Skin is warm and dry.     Findings: No rash.  Neurological:     General: No focal deficit present.     Mental Status: She is alert and oriented to person, place, and time.     GCS: GCS eye subscore is 4. GCS verbal subscore is 5. GCS motor subscore is 6.     Cranial Nerves: No cranial nerve deficit.     Sensory: No sensory deficit.     Motor: Weakness (Generalized) present.     Coordination: Coordination normal.     Gait: Gait normal.     Comments: Patient with generalized extremity weakness.  She can squeeze my fingers, but cannot hold her arms up against gravity, cannot hold her legs up against gravity.  Psychiatric:        Mood and Affect: Mood normal.     (all labs ordered are listed, but only abnormal results are displayed) Labs Reviewed  COMPREHENSIVE METABOLIC PANEL WITH GFR - Abnormal; Notable for the  following components:      Result Value   Sodium 132 (*)    Chloride 96 (*)    Glucose, Bld 282 (*)    BUN 61 (*)    Creatinine, Ser 2.29 (*)    Calcium  11.3 (*)    GFR, Estimated 24 (*)    All other components within normal limits  CBG MONITORING, ED - Abnormal; Notable for the following components:   Glucose-Capillary 270 (*)    All other components within normal limits  RESP PANEL BY RT-PCR (RSV, FLU A&B, COVID)  RVPGX2  CBC  URINALYSIS, ROUTINE W REFLEX MICROSCOPIC  CK  MAGNESIUM   TROPONIN T, HIGH SENSITIVITY    EKG: None  Radiology: No results found.   Procedures   Medications Ordered in the ED  sodium chloride  0.9 % bolus 1,000 mL (has no administration in time range)   ED Course  Patient seen and examined. History obtained directly from patient and family member at bedside.  Patient interview was very difficult.  Spanish video interpreter utilized.  Patient speaks very softly, gives very few details.  Interpreter needed asked several times because she cannot hear and the patient's speech.  Labs/EKG: Ordered CBC, CMP, magnesium , troponin, CK, UA, viral panel.  Imaging: Ordered chest x-ray and CT head.  Medications/Fluids: IV fluid bolus  Most recent vital signs reviewed and are as follows: BP (!) 173/68   Pulse 68   Temp (!) 97.5 F (36.4 C) (Oral)   Resp 15   SpO2 99%   Initial impression: Generalized weakness and patient with multiple comorbidities.  Patient poor historian, difficult history with language barrier.  Broad workup initiated.  6:31 PM Reassessment performed. Patient appears stable.   Discussed with and seen by Dr. Freddi as well.   Labs personally reviewed and interpreted including: CBC with normal white blood cell count and hemoglobin; CMP with creatinine near baseline  at 2.29 with a BUN of 61, calcium  a bit high at 11.3, hemoglobin 282 with a normal anion gap of 14, mildly low sodium and chloride; magnesium  appears normal; CK is  normal; troponin 29 >> 26; UA without signs of infection.  Viral panel was negative.  Imaging personally visualized and interpreted including: CT head, agree unremarkable for an cardio abnormality, chest x-ray was negative without acute findings.  Reviewed pertinent lab work and imaging with patient at bedside. Questions answered.   Most current vital signs reviewed and are as follows: BP (!) 163/68   Pulse 68   Temp 97.7 F (36.5 C) (Oral)   Resp 10   SpO2 97%   Plan: Patient was ambulated and orthostatics checked.  Reportedly unsteady.  Additional IV fluids ordered.  Orthostatic VS for the past 24 hrs:  BP- Lying Pulse- Lying BP- Sitting Pulse- Sitting BP- Standing at 0 minutes Pulse- Standing at 0 minutes  06/01/24 1747 155/64 71 162/64 74 119/55 74   8:16 PM Reassessment performed. Patient appears stable.  Discussed lab results with patient at bedside using video interpreter.  She states that she is feeling a little bit stronger now.  I asked her if she felt comfortable with discharge and she states that she does.  She denies any concerns.  Reviewed pertinent lab work and imaging with patient at bedside. Questions answered.   Most current vital signs reviewed and are as follows: BP (!) 180/73   Pulse 67   Temp 97.7 F (36.5 C) (Oral)   Resp 10   SpO2 100%   Plan: Discharge to home.   Prescriptions written for: None  Other home care instructions discussed: Rest, hydration  ED return instructions discussed: Patient counseled to return if they have weakness in their arms or legs, slurred speech, trouble walking or talking, confusion, trouble with their balance, or if they have any other concerns. Patient verbalizes understanding and agrees with plan.   Follow-up instructions discussed: Patient encouraged to follow-up with their PCP in 2 days.                                    Medical Decision Making Amount and/or Complexity of Data Reviewed Labs: ordered. Radiology:  ordered.   Generalized weakness and syncope: Unclear etiology of symptoms.  Patient reports having some symptoms that have been progressing over several weeks, family member noticed change today.  No focality of weakness.  Broad workup including basic lab work shows kidney function near baseline without significant changes or clinically significant electrolyte abnormalities.  Cardiac enzymes mildly elevated but stable and flat.  EKG without arrhythmia, prolonged QT, WPW, Brugada syndrome or other condition that would predispose for syncope.  Do not suspect stroke given no focal symptoms.  No obvious infections.  Chest x-ray, UA clear.  Viral panel was negative.  The patient's vital signs, pertinent lab work and imaging were reviewed and interpreted as discussed in the ED course. Hospitalization was considered for further testing, treatments, or serial exams/observation. However as patient is well-appearing, has a stable exam, and reassuring studies today, I do not feel that they warrant admission at this time. This plan was discussed with the patient who verbalizes agreement and comfort with this plan and seems reliable and able to return to the Emergency Department with worsening or changing symptoms.   Patient discussed and seen by Dr. Freddi as well. Clinically improving during ED stay  with fluids.       Final diagnoses:  Syncope, unspecified syncope type  Generalized weakness  Elevated blood pressure reading    ED Discharge Orders     None          Desiderio Chew, PA-C 06/01/24 2019    Freddi Hamilton, MD 06/01/24 2131

## 2024-06-01 NOTE — ED Triage Notes (Signed)
 She feels very weak and dizzy. Fever during this week with intermittent nausea. 3 weeks took flu/covid/pna vaccine. Passed going to the vehicle. Denies vomiting.     Video interpreter Sarahi # [id number]

## 2024-06-01 NOTE — Discharge Instructions (Signed)
 Please read and follow all provided instructions.  Your diagnoses today include:  1. Syncope, unspecified syncope type   2. Generalized weakness   3. Elevated blood pressure reading     Tests performed today include: Complete blood cell count: Normal blood cell counts Complete metabolic panel: No major issues, your kidney function appears to be near baseline, your blood sugar was elevated which can lead to some dehydration Cardiac enzymes (blood test looking for stress on the heart): Were slightly elevated but no signs of heart attack EKG: No signs of abnormal heart rhythm Chest x-ray: No signs of pneumonia CT scan of your head: No signs of stroke or other acute problems Urinalysis (urine test): No signs of infection today Blood test for muscle breakdown was normal Flu, COVID, RSV testing was normal Vital signs. See below for your results today.   Medications prescribed:  None  Take any prescribed medications only as directed.  Home care instructions:  Follow any educational materials contained in this packet.  BE VERY CAREFUL not to take multiple medicines containing Tylenol  (also called acetaminophen ). Doing so can lead to an overdose which can damage your liver and cause liver failure and possibly death.   Follow-up instructions: Please follow-up with your primary care provider in the next 2 days for further evaluation of your symptoms.   Return instructions:  Please return to the Emergency Department if you experience worsening symptoms.  Return if you have weakness in your arms or legs, slurred speech, trouble walking or talking, confusion, or trouble with your balance.  Please return if you have any other emergent concerns.  Additional Information:  Your vital signs today were: BP (!) 180/73   Pulse 67   Temp 97.7 F (36.5 C) (Oral)   Resp 10   SpO2 100%  If your blood pressure (BP) was elevated above 135/85 this visit, please have this repeated by your doctor  within one month. --------------

## 2024-06-21 ENCOUNTER — Other Ambulatory Visit: Payer: Self-pay | Admitting: Nurse Practitioner

## 2024-06-21 DIAGNOSIS — C50312 Malignant neoplasm of lower-inner quadrant of left female breast: Secondary | ICD-10-CM

## 2024-06-21 NOTE — Progress Notes (Signed)
 " Patient Care Team: Leontine Cramp, NP as PCP - General (Nurse Practitioner) Lonni Slain, MD as PCP - Cardiology (Cardiology) Marlee Bernardino NOVAK, MD as Consulting Physician (Nephrology) Melia Lynwood ORN, MD as Referring Physician (Nephrology) Lanny Callander, MD as Consulting Physician (Hematology) Vanderbilt Ned, MD as Consulting Physician (General Surgery) Crawford, Morna Pickle, NP as Nurse Practitioner (Hematology and Oncology) Starla Wendelyn BIRCH, RN as Registered Nurse  Clinic Day:  06/22/2024  Referring physician: Leontine Cramp, NP  ASSESSMENT & PLAN:   Assessment & Plan: Malignant neoplasm of lower-inner quadrant of left breast in female, estrogen receptor positive (HCC)  Stage IA, 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 Ki-67 of 20%. Oncotype showed high risk. -She began adjuvant TC on 05/02/21 after delayed wound healing. She tolerated C1 poorly with nausea with vomiting and diarrhea. She was able to recover well. She experienced cystitis following C2 and C3. She managed to complete 4 cycles, but she has had a slow recovery. -she started exemestane  in 08/2021. She has developed worsening joint pains on exemestane  and switched to tamoxifen  in 04/2022 -continue surveillance, no mammogram s/p bilateral mastectomy. -Ms. Strother appears stable. Tolerating tamoxifen  well overall. She has 2 month h/o tenderness at the mastectomy sites, sporadic ttp to the ribs without palpable abnormality. We reviewed this could be surgical pain vs masked pain from lifting grandchildren, vs other.  -chest/rib Xray was offered, but she had to leave for another appointment. Will come back for Xray at a later date.  -was recommended she hold Tamoxifen  to see if this is joint pain from medication.  -she did go for x-ray of the ribs on 05/08/2023. There was no evidence of bony  or other abnormalities.  - Ultrasound left breast for further evaluation of cellulitis.   -- Targeted ultrasound of left and right breasts done on 12/12/2023.  Ultrasound of the left side showed normal tissue along the margins of medial mastectomy scar.  There was no mass or fluid collection.  Targeted ultrasound of the right breast showed normal tissue lateral to the right mastectomy scar and in the right axillary region.  There are no masses.  There were no enlarged or abnormal lymph nodes, bilaterally. -continue tamoxifen  20 mg daily.  -continue breast cancer surveillance with labs and follow up in 6 months, sooner if needed. - Patient clinically doing well.  Continue gabapentin  as prescribed.  Add hydroxyzine  at night continued irritation of surgical scars.  Continue tamoxifen  daily.  Plan for labs and follow-up in 6 months, sooner if needed.    Bone health Patient had DEXA scan on 02/15/2022.  Her bone density was normal. A new bone density test was ordered as part of today's visit.   Anemia Patient has mild and stable anemia with Hgb 11.3 and HCT 34.1.  Blood count is otherwise within normal limits.  Will continue to monitor with each visit.  Skin irritation Patient continues to have itching and irritation around surgical scars from bilateral mastectomy.  Topical corticosteroids are not helping.  Itching and irritation is so severe it can keep her awake at night.  She continues gabapentin  as previously prescribed.  Felt like it helped more when initially prescribed.  Will have her try prescription for hydroxyzine  at night when needed for itching and irritation.  New prescription sent to her pharmacy today.  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  She is followed very closely by nephrology.  Overall, renal functions are stable.  Left breast cancer, ER + Patient is s/p bilateral mastectomy.  Currently on tamoxifen  20 mg daily.  Tolerating  well with mild hot flashes and night sweats.  No necessity for breast imaging due to bilateral mastectomies.  Continue with breast cancer surveillance.   The patient understands the plans discussed today and is in agreement with them.  She knows to contact our office if she develops concerns prior to her next appointment.  I provided 25 minutes of face-to-face time during this encounter and > 50% was spent counseling as documented under my assessment and plan.    Powell FORBES Lessen, NP  [facility] - A DEPT OF [facility] [address] [place] [address] Dept: [phone] Dept Fax: [phone]   Orders Placed This Encounter  Procedures   DG Bone Density    Standing Status:   Future    Expected Date:   07/23/2024    Expiration Date:   06/22/2025    Reason for Exam (SYMPTOM  OR DIAGNOSIS REQUIRED):   estrogen deficiency. bilateral oophorectomy    Is patient pregnant?:   No    Preferred imaging location?:   [facility]      CHIEF COMPLAINT:  CC: Left breast cancer, ER +  Current Treatment: Tamoxifen  daily (started antiestrogen 08/2021)  INTERVAL HISTORY:  Cynthia [name] is here today for repeat clinical assessment.  She has a family on 12/20/2023.  She has not had bilateral mastectomies.  Having itching and irritation around bilateral mastectomy surgical scars.  Topicals not helping.  Is taking gabapentin  which helps for mammogram initially prescribed.  She states that if she is considering we will keep her awake at night sometimes.  Continue tamoxifen  daily.  She tolerates well without negative side effects including night sweats or hot flashes.  She denies chest pain, chest pressure, or shortness of breath. She denies headaches or visual disturbances. She denies abdominal pain, nausea, vomiting, or changes in bowel or bladder habits.  She denies fevers or chills. She denies pain. Her appetite is good. Her weight  has been stable.  I have reviewed the past medical history, past surgical history, social history and family history with the patient and they are unchanged from previous note.  ALLERGIES:  is allergic to latex, docetaxel , norvasc  [amlodipine ], and tape.  MEDICATIONS:  Current Outpatient Medications  Medication Sig Dispense Refill   acetaminophen  (TYLENOL ) 500 MG tablet Take 500 mg by mouth every 6 (six) hours as needed.     aspirin  EC 81 MG tablet Take 81 mg by mouth daily.     carvedilol  (COREG ) 25 MG tablet Take 25 mg by mouth 2 (two) times daily with a meal.     furosemide  (LASIX ) 40 MG tablet Take 80 mg by mouth 2 (two) times daily as needed for fluid or edema.     gabapentin  (NEURONTIN ) 300 MG capsule TAKE 2 CAPSULES(600 MG) BY MOUTH TWICE DAILY 120 capsule 1   hydrOXYzine  (ATARAX ) 10 MG tablet Take 1 tablet (10 mg total) by mouth at bedtime as needed. 30 tablet 1   insulin  glargine (LANTUS  SOLOSTAR) 100 UNIT/ML Solostar Pen Inject 30 Units into the skin 2 (two) times daily.     magnesium  oxide (MAG-OX) 400 MG tablet Take 400 mg by mouth 2 (two) times daily.     mirtazapine (  REMERON) 7.5 MG tablet Take 7.5 mg by mouth at bedtime.     multivitamin-lutein (OCUVITE-LUTEIN) CAPS capsule Take 1 capsule by mouth daily.     mycophenolate  (MYFORTIC ) 180 MG EC tablet Take 360 mg by mouth 2 (two) times daily.     pantoprazole  (PROTONIX ) 40 MG tablet Take 1 tablet (40 mg total) by mouth 2 (two) times daily. 60 tablet 11   cholecalciferol  (VITAMIN D3) 25 MCG (1000 UNIT) tablet Take 1,000 Units by mouth daily. (Patient not taking: Reported on 06/22/2024)     cyanocobalamin  1000 MCG tablet Take 1,000 mcg by mouth daily. (Patient not taking: Reported on 06/22/2024)     doxycycline  (VIBRA -TABS) 100 MG tablet Take 1 tablet (100 mg total) by mouth 2 (two) times daily. (Patient not taking: Reported on 06/22/2024) 20 tablet 0   escitalopram  (LEXAPRO ) 10 MG tablet Take 10 mg by mouth daily. (Patient not  taking: Reported on 06/22/2024)     fluconazole  (DIFLUCAN ) 100 MG tablet Take 2 tablets day 1 Take 1 tablet daily thereafter until gone (Patient not taking: Reported on 06/22/2024) 15 tablet 0   Garlic 100 MG TABS Take 100 mg by mouth daily. (Patient not taking: Reported on 06/22/2024)     hydrALAZINE  (APRESOLINE ) 25 MG tablet Take 25 mg by mouth 2 (two) times daily. (Patient not taking: Reported on 06/22/2024)     HYDROcodone -acetaminophen  (NORCO/VICODIN) 5-325 MG tablet Take 1-2 tablets by mouth every 4 (four) hours as needed. (Patient not taking: Reported on 06/22/2024) 12 tablet 0   methocarbamol  (ROBAXIN ) 500 MG tablet Take 500 mg by mouth every 8 (eight) hours as needed. (Patient not taking: Reported on 06/22/2024)     mupirocin  ointment (BACTROBAN ) 2 % Apply to effected areas bid for 7 days (Patient not taking: Reported on 06/22/2024) 22 g 0   ondansetron  (ZOFRAN ) 8 MG tablet Take 8 mg by mouth every 8 (eight) hours as needed for nausea, vomiting or refractory nausea / vomiting. (Patient not taking: Reported on 06/22/2024)     predniSONE  (DELTASONE ) 5 MG tablet Take 5 mg by mouth daily.  (Patient not taking: Reported on 06/22/2024)  2   promethazine  (PHENERGAN ) 12.5 MG tablet Take 1 tablet (12.5 mg total) by mouth every 8 (eight) hours as needed for nausea. (Patient not taking: Reported on 06/22/2024) 60 tablet 0   rosuvastatin  (CRESTOR ) 5 MG tablet Take 5 mg by mouth at bedtime. (Patient not taking: Reported on 06/22/2024)     tacrolimus  (PROGRAF ) 1 MG capsule Take 2-3 mg by mouth 2 (two) times daily. Take three capsules in the morning. Take two capsules in the evening. (Patient not taking: Reported on 06/22/2024)     tamoxifen  (NOLVADEX ) 20 MG tablet Take 1 tablet (20 mg total) by mouth daily. (Patient not taking: Reported on 06/22/2024) 30 tablet 5   TRADJENTA 5 MG TABS tablet Take 5 mg by mouth every morning. (Patient not taking: Reported on 06/22/2024)     triamcinolone  ointment (KENALOG )  0.5 % Apply to effected areas bid prn itching (Patient not taking: Reported on 06/22/2024) 30 g 0   No current facility-administered medications for this visit.    HISTORY OF PRESENT ILLNESS:   Oncology History Overview Note  Cancer Staging Malignant neoplasm of lower-inner quadrant of left breast in female, estrogen receptor positive (HCC) Staging form: Breast, AJCC 8th Edition - Clinical stage from 02/09/2021: Stage IA (cT1c, cN0, cM0, G2, ER+, PR+, HER2-) - Signed by Lanny Callander, MD on 02/20/2021 Stage prefix: Initial diagnosis Histologic  grading system: 3 grade system - Pathologic stage from 03/02/2021: Stage IA (pT2, pN0, cM0, G2, ER+, PR+, HER2-, Oncotype DX score: 26) - Signed by Lanny Callander, MD on 03/21/2021 Stage prefix: Initial diagnosis Multigene prognostic tests performed: Oncotype DX Recurrence score range: Greater than or equal to 11 Histologic grading system: 3 grade system Residual tumor (R): R0 - None    Malignant neoplasm of lower-inner quadrant of left breast in female, estrogen receptor positive (HCC)  02/07/2021 Mammogram   Exam: Left Diagnostic Mammogram; Left Breast Ultrasound  IMPRESSION: Irregular mass in left breast at 6:30, measuring 1.6 cm by mammogram, is highly suggestive of malignancy. Left axillary ultrasound is negative for lymphadenopathy.   02/09/2021 Cancer Staging   Staging form: Breast, AJCC 8th Edition - Clinical stage from 02/09/2021: Stage IA (cT1c, cN0, cM0, G2, ER+, PR+, HER2-) - Signed by Lanny Callander, MD on 02/20/2021 Stage prefix: Initial diagnosis Histologic grading system: 3 grade system   02/09/2021 Pathology Results   Diagnosis Breast, left, needle core biopsy, 6:30 o'clock 8cm fn - INVASIVE DUCTAL CARCINOMA WITH HISTIOCYTOID FEATURES. SEE NOTE Diagnosis Note Carcinoma measures 1 cm in greatest linear dimension and appears grade 2.  PROGNOSTIC INDICATORS Results: IMMUNOHISTOCHEMICAL AND MORPHOMETRIC ANALYSIS PERFORMED MANUALLY The tumor  cells are EQUIVOCAL for Her2 (2+). Her2 by FISH will be performed and results performed separately. Estrogen Receptor: >95%, POSITIVE, STRONG STAINING INTENSITY Progesterone Receptor: 90%, POSITIVE, STRONG STAINING INTENSITY Proliferation Marker Ki67: 20%  FLUORESCENCE IN-SITU HYBRIDIZATION Results: GROUP 5: HER2 **NEGATIVE** Equivocal form of amplification of the HER2 gene was detected in the IHC 2+ tissue sample received from this individual. HER2 FISH was performed by a technologist and cell imaging and analysis on the [employer]. RATIO OF HER2/CEN17 SIGNALS 1.30 AVERAGE HER2 COPY NUMBER PER CELL 1.75   02/15/2021 Initial Diagnosis   Malignant neoplasm of lower-inner quadrant of left breast in female, estrogen receptor positive (HCC)   03/01/2021 Genetic Testing   Positive genetic testing: pathogenic variant detected in BRCA2 at [id number].  No other pathogenic variants detected in [employer] Custom[employer] Panel.  The report date is 03/19/2021.  The CustomNext-Cancer+RNAinsight panel offered by [employer] includes sequencing and rearrangement analysis for the following 47 genes:  APC, ATM, AXIN2, BARD1, BMPR1A, BRCA1, BRCA2, BRIP1, CDH1, CDK4, CDKN2A, CHEK2, DICER1, EPCAM, GREM1, HOXB13, MEN1, MLH1, MSH2, MSH3, MSH6, MUTYH, NBN, NF1, NF2, NTHL1, PALB2, PMS2, POLD1, POLE, PTEN, RAD51C, RAD51D, RECQL, RET, SDHA, SDHAF2, SDHB, SDHC, SDHD, SMAD4, SMARCA4, STK11, TP53, TSC1, TSC2, and VHL.  RNA data is routinely analyzed for use in variant interpretation for all genes.   03/02/2021 Cancer Staging   Staging form: Breast, AJCC 8th Edition - Pathologic stage from 03/02/2021: Stage IA (pT2, pN0, cM0, G2, ER+, PR+, HER2-, Oncotype DX score: 26) - Signed by Lanny Callander, MD on 03/21/2021 Stage prefix: Initial diagnosis Multigene prognostic tests performed: Oncotype DX Recurrence score range: Greater than or equal to 11 Histologic grading system: 3 grade system Residual tumor (R): R0 - None   05/02/2021 -  07/12/2021 Chemotherapy   Patient is on Treatment Plan : BREAST TC q21d      08/22/2021 Pathology Results   FINAL MICROSCOPIC DIAGNOSIS:   A. BREAST, LEFT, MASTECTOMY:  - Fibrocystic changes.  - Lumpectomy scar with hemosiderin deposition.  - No malignancy identified.   B. BREAST, RIGHT, MASTECTOMY:  - Fibrocystic changes with focal fibroadenomatoid change.  - No malignancy identified.    08/2021 -  Anti-estrogen oral therapy   Exemestane  x 6 months, discontinued due to  arthralgias and changed to Tamoxifen  in 04/2022   07/11/2022 Surgery   BSO with Dr. Viktoria: benign bilateral fallopian tubes and ovaries. (Pt already s/p TAH)       REVIEW OF SYSTEMS:   Constitutional: Denies fevers, chills or abnormal weight loss Eyes: Denies blurriness of vision Ears, nose, mouth, throat, and face: Denies mucositis or sore throat Respiratory: Denies cough, dyspnea or wheezes Cardiovascular: Denies palpitation, chest discomfort or lower extremity swelling Gastrointestinal:  Denies nausea, heartburn or change in bowel habits Skin: Denies abnormal skin rashes. Itching and irritation of surgical scars from bilateral mastectomies.  Lymphatics: Denies new lymphadenopathy or easy bruising Neurological:Denies numbness, tingling or new weaknesses Behavioral/Psych: Mood is stable, no new changes  All other systems were reviewed with the patient and are negative.   VITALS:   Today's Vitals   06/22/24 1000 06/22/24 1021  BP: (!) 153/66 (!) 135/52  Pulse: 70   Resp: 18   Temp: (!) 97.5 F (36.4 C)   TempSrc: Temporal   SpO2: 100%   Weight: 186 lb 12.8 oz (84.7 kg)   PainSc: 4     Body mass index is 35.3 kg/m.   Wt Readings from Last 3 Encounters:  06/22/24 186 lb 12.8 oz (84.7 kg)  12/20/23 164 lb 2 oz (74.4 kg)  11/20/23 154 lb (69.9 kg)    Body mass index is 35.3 kg/m.  Performance status (ECOG): 1 - Symptomatic but completely ambulatory  PHYSICAL EXAM:   GENERAL:alert, no  distress and comfortable SKIN: skin color, texture, turgor are normal, no rashes or significant lesions EYES: normal, Conjunctiva are pink and non-injected, sclera clear OROPHARYNX:no exudate, no erythema and lips, buccal mucosa, and tongue normal  NECK: supple, thyroid  normal size, non-tender, without nodularity LYMPH:  no palpable lymphadenopathy in the cervical, axillary or inguinal LUNGS: clear to auscultation and percussion with normal breathing effort HEART: regular rate & rhythm and no murmurs and no lower extremity edema ABDOMEN:abdomen soft, non-tender and normal bowel sounds Musculoskeletal:no cyanosis of digits and no clubbing  NEURO: alert & oriented x 3 with fluent speech, no focal motor/sensory deficits BREAST: Bilateral mastectomy scars are well-healed.  Some redness without nodularity.  No warmth, no evidence of infection.  The chest wall is without palpable lumps or masses.  There is no axillary lymphadenopathy bilaterally.   LABORATORY DATA:  I have reviewed the data as listed    Component Value Date/Time   NA 140 06/22/2024 0943   NA 136 02/27/2019 1152   K 5.2 (H) 06/22/2024 0943   CL 106 06/22/2024 0943   CO2 26 06/22/2024 0943   GLUCOSE 107 (H) 06/22/2024 0943   BUN 50 (H) 06/22/2024 0943   BUN 19 02/27/2019 1152   CREATININE 2.52 (H) 06/22/2024 0943   CALCIUM  10.3 06/22/2024 0943   PROT 6.4 (L) 06/22/2024 0943   ALBUMIN  4.0 06/22/2024 0943   AST 15 06/22/2024 0943   ALT 10 06/22/2024 0943   ALKPHOS 98 06/22/2024 0943   BILITOT 0.3 06/22/2024 0943   GFRNONAA 22 (L) 06/22/2024 0943   GFRAA >60 06/06/2019 0250    Lab Results  Component Value Date   WBC 4.7 06/22/2024   NEUTROABS 2.5 06/22/2024   HGB 11.3 (L) 06/22/2024   HCT 34.1 (L) 06/22/2024   MCV 86.1 06/22/2024   PLT 189 06/22/2024    RADIOGRAPHIC STUDIES: DG Chest 2 View Result Date: 06/01/2024 EXAM: 2 VIEW(S) XRAY OF THE CHEST 06/01/2024 04:18:00 PM COMPARISON: Comparison is 05/08/2023.  CLINICAL HISTORY: weakness  FINDINGS: LINES, TUBES AND DEVICES: Multiple wires and leads project over the chest on the frontal radiograph. LUNGS AND PLEURA: Low lung volumes. This results in pulmonary interstitial prominence. No focal pulmonary opacity. No pleural effusion. No pneumothorax. HEART AND MEDIASTINUM: No acute abnormality of the cardiac and mediastinal silhouettes. BONES AND SOFT TISSUES: Cholecystectomy clips are present in the right upper quadrant. Bilateral axillary surgical clips are present. No acute osseous abnormality. IMPRESSION: 1. Low lung volumes, without acute disease. Electronically signed by: Rockey Kilts MD 06/01/2024 04:53 PM EST RP Workstation: [id number]   CT Head Wo Contrast Result Date: 06/01/2024 EXAM: CT HEAD WITHOUT CONTRAST 06/01/2024 04:31:52 PM TECHNIQUE: CT of the head was performed without the administration of intravenous contrast. Automated exposure control, iterative reconstruction, and/or weight based adjustment of the mA/kV was utilized to reduce the radiation dose to as low as reasonably achievable. COMPARISON: 12/20/2023 CLINICAL HISTORY: generalized weakness, can't lift arms, h/o breast CA FINDINGS: BRAIN AND VENTRICLES: No acute hemorrhage. No evidence of acute infarct. No hydrocephalus. No extra-axial collection. No mass effect or midline shift. Mild calcific atheromatous disease. ORBITS: Status post bilateral lens replacement. SINUSES: Complete opacification of maxillary sinuses. Moderate thickening in ethmoid air cells. SOFT TISSUES AND SKULL: No acute soft tissue abnormality. No skull fracture. IMPRESSION: 1. No acute intracranial abnormality. 2. Complete opacification of the maxillary sinuses and moderate ethmoid air cell mucosal thickening. Electronically signed by: Donnice Mania MD 06/01/2024 04:39 PM EST RP Workstation: [id number]   "

## 2024-06-21 NOTE — Assessment & Plan Note (Addendum)
"   Stage IA, 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 Ki-67 of 20%. Oncotype showed high risk. -She began adjuvant TC on 05/02/21 after delayed wound healing. She tolerated C1 poorly with nausea with vomiting and diarrhea. She was able to recover well. She experienced cystitis following C2 and C3. She managed to complete 4 cycles, but she has had a slow recovery. -she started exemestane  in 08/2021. She has developed worsening joint pains on exemestane  and switched to tamoxifen  in 04/2022 -continue surveillance, no mammogram s/p bilateral mastectomy. -Cynthia [name] appears stable. Tolerating tamoxifen  well overall. She has 2 month h/o tenderness at the mastectomy sites, sporadic ttp to the ribs without palpable abnormality. We reviewed this could be surgical pain vs masked pain from lifting grandchildren, vs other.  -chest/rib Xray was offered, but she had to leave for another appointment. Will come back for Xray at a later date.  -was recommended she hold Tamoxifen  to see if this is joint pain from medication.  -she did go for x-ray of the ribs on 05/08/2023. There was no evidence of bony or other abnormalities.  - Ultrasound left breast for further evaluation of cellulitis.   -- Targeted ultrasound of left and right breasts done on 12/12/2023.  Ultrasound of the left side showed normal tissue along the margins of medial mastectomy scar.  There was no mass or fluid collection.  Targeted ultrasound of the right breast showed normal tissue lateral to the right mastectomy scar and in the right axillary region.  There are no masses.  There were no enlarged or abnormal lymph nodes, bilaterally. -continue tamoxifen  20 mg daily.  -continue breast cancer surveillance with labs and follow up in 6 months, sooner if needed. - Patient clinically doing well.  Continue gabapentin  as  prescribed.  Add hydroxyzine  at night continued irritation of surgical scars.  Continue tamoxifen  daily.  Plan for labs and follow-up in 6 months, sooner if needed. "

## 2024-06-22 ENCOUNTER — Inpatient Hospital Stay (HOSPITAL_BASED_OUTPATIENT_CLINIC_OR_DEPARTMENT_OTHER): Admitting: Nurse Practitioner

## 2024-06-22 ENCOUNTER — Inpatient Hospital Stay: Attending: Nurse Practitioner

## 2024-06-22 VITALS — BP 135/52 | HR 70 | Temp 97.5°F | Resp 18 | Wt 186.8 lb

## 2024-06-22 DIAGNOSIS — Z170 Estrogen receptor positive status [ER+]: Secondary | ICD-10-CM | POA: Insufficient documentation

## 2024-06-22 DIAGNOSIS — D649 Anemia, unspecified: Secondary | ICD-10-CM | POA: Diagnosis not present

## 2024-06-22 DIAGNOSIS — Z79899 Other long term (current) drug therapy: Secondary | ICD-10-CM | POA: Diagnosis not present

## 2024-06-22 DIAGNOSIS — Z1732 Human epidermal growth factor receptor 2 negative status: Secondary | ICD-10-CM | POA: Insufficient documentation

## 2024-06-22 DIAGNOSIS — Z1721 Progesterone receptor positive status: Secondary | ICD-10-CM | POA: Diagnosis not present

## 2024-06-22 DIAGNOSIS — L299 Pruritus, unspecified: Secondary | ICD-10-CM | POA: Diagnosis not present

## 2024-06-22 DIAGNOSIS — C50312 Malignant neoplasm of lower-inner quadrant of left female breast: Secondary | ICD-10-CM | POA: Diagnosis not present

## 2024-06-22 DIAGNOSIS — Z9013 Acquired absence of bilateral breasts and nipples: Secondary | ICD-10-CM | POA: Diagnosis not present

## 2024-06-22 DIAGNOSIS — E2839 Other primary ovarian failure: Secondary | ICD-10-CM

## 2024-06-22 DIAGNOSIS — Z940 Kidney transplant status: Secondary | ICD-10-CM | POA: Insufficient documentation

## 2024-06-22 LAB — CMP (CANCER CENTER ONLY)
ALT: 10 U/L (ref 0–44)
AST: 15 U/L (ref 15–41)
Albumin: 4 g/dL (ref 3.5–5.0)
Alkaline Phosphatase: 98 U/L (ref 38–126)
Anion gap: 9 (ref 5–15)
BUN: 50 mg/dL — ABNORMAL HIGH (ref 6–20)
CO2: 26 mmol/L (ref 22–32)
Calcium: 10.3 mg/dL (ref 8.9–10.3)
Chloride: 106 mmol/L (ref 98–111)
Creatinine: 2.52 mg/dL — ABNORMAL HIGH (ref 0.44–1.00)
GFR, Estimated: 22 mL/min — ABNORMAL LOW
Glucose, Bld: 107 mg/dL — ABNORMAL HIGH (ref 70–99)
Potassium: 5.2 mmol/L — ABNORMAL HIGH (ref 3.5–5.1)
Sodium: 140 mmol/L (ref 135–145)
Total Bilirubin: 0.3 mg/dL (ref 0.0–1.2)
Total Protein: 6.4 g/dL — ABNORMAL LOW (ref 6.5–8.1)

## 2024-06-22 LAB — CBC WITH DIFFERENTIAL (CANCER CENTER ONLY)
Abs Immature Granulocytes: 0.01 K/uL (ref 0.00–0.07)
Basophils Absolute: 0 K/uL (ref 0.0–0.1)
Basophils Relative: 1 %
Eosinophils Absolute: 0.2 K/uL (ref 0.0–0.5)
Eosinophils Relative: 5 %
HCT: 34.1 % — ABNORMAL LOW (ref 36.0–46.0)
Hemoglobin: 11.3 g/dL — ABNORMAL LOW (ref 12.0–15.0)
Immature Granulocytes: 0 %
Lymphocytes Relative: 28 %
Lymphs Abs: 1.3 K/uL (ref 0.7–4.0)
MCH: 28.5 pg (ref 26.0–34.0)
MCHC: 33.1 g/dL (ref 30.0–36.0)
MCV: 86.1 fL (ref 80.0–100.0)
Monocytes Absolute: 0.6 K/uL (ref 0.1–1.0)
Monocytes Relative: 13 %
Neutro Abs: 2.5 K/uL (ref 1.7–7.7)
Neutrophils Relative %: 53 %
Platelet Count: 189 K/uL (ref 150–400)
RBC: 3.96 MIL/uL (ref 3.87–5.11)
RDW: 11.9 % (ref 11.5–15.5)
WBC Count: 4.7 K/uL (ref 4.0–10.5)
nRBC: 0 % (ref 0.0–0.2)

## 2024-06-22 MED ORDER — HYDROXYZINE HCL 10 MG PO TABS: 10.0000 mg | ORAL_TABLET | Freq: Every evening | ORAL | 1 refills | Status: AC | PRN

## 2024-06-28 ENCOUNTER — Encounter: Payer: Self-pay | Admitting: Nurse Practitioner

## 2024-07-02 ENCOUNTER — Encounter: Payer: Self-pay | Admitting: Nephrology

## 2024-07-20 ENCOUNTER — Institutional Professional Consult (permissible substitution): Admitting: Diagnostic Neuroimaging

## 2024-07-21 ENCOUNTER — Telehealth: Payer: Self-pay | Admitting: Diagnostic Neuroimaging

## 2024-07-21 ENCOUNTER — Ambulatory Visit: Admitting: Diagnostic Neuroimaging

## 2024-07-21 ENCOUNTER — Encounter: Payer: Self-pay | Admitting: Diagnostic Neuroimaging

## 2024-07-21 ENCOUNTER — Institutional Professional Consult (permissible substitution): Admitting: Diagnostic Neuroimaging

## 2024-07-21 VITALS — BP 134/68 | HR 74 | Ht 61.0 in | Wt 186.0 lb

## 2024-07-21 DIAGNOSIS — G3184 Mild cognitive impairment, so stated: Secondary | ICD-10-CM | POA: Diagnosis not present

## 2024-07-21 NOTE — Telephone Encounter (Signed)
 "  Patient called to reschedule appointment  "

## 2024-07-21 NOTE — Progress Notes (Signed)
 "  [facility]  PATIENT: Cynthia [name] DOB: [birth year]  REFERRING CLINICIAN: Leontine Cramp, NP HISTORY FROM: patient  REASON FOR VISIT: new consult   HISTORICAL  CHIEF COMPLAINT:  Chief Complaint  Patient presents with   RM 6     Patient is here with her husband and interpreter for memory concerns. Patient was last seen 12/24/2014 for tremors. Has been having issues with her memory for about a year. Has been having issues with both long and short term memories.     HISTORY OF PRESENT ILLNESS:   UPDATE (07/21/24, VRP): 54 year old female here for evaluation of memory loss.  History of end-stage renal disease status post kidney transplant.  Symptom started about 1 or 2 years ago with mild short-term memory loss, forgetting recent events, conversations, where she put objects.  She feels distracted.  She has chronic insomnia, sleep apnea but not able to tolerate CPAP, chronic pain all over and depression.  She is able to maintain most of her ADLs but some difficulty with medications and communication.  She lives with husband.     REVIEW OF SYSTEMS: Full 14 system review of systems performed and negative with exception of: as per HPI.  ALLERGIES: Allergies[1]  HOME MEDICATIONS: Outpatient Medications Prior to Visit  Medication Sig Dispense Refill   acetaminophen  (TYLENOL ) 500 MG tablet Take 500 mg by mouth every 6 (six) hours as needed.     aspirin  EC 81 MG tablet Take 81 mg by mouth daily.     carvedilol  (COREG ) 25 MG tablet Take 25 mg by mouth 2 (two) times daily with a meal.     furosemide  (LASIX ) 40 MG tablet Take 80 mg by mouth 2 (two) times daily as needed for fluid or edema.     gabapentin  (NEURONTIN ) 300 MG capsule TAKE 2 CAPSULES(600 MG) BY MOUTH TWICE DAILY 120 capsule 1   hydrOXYzine  (ATARAX ) 10 MG tablet Take 1 tablet (10 mg total) by mouth at bedtime as needed. 30 tablet 1   insulin  glargine (LANTUS  SOLOSTAR) 100 UNIT/ML Solostar Pen Inject 30 Units  into the skin 2 (two) times daily.     magnesium  oxide (MAG-OX) 400 MG tablet Take 400 mg by mouth 2 (two) times daily.     mirtazapine (REMERON) 7.5 MG tablet Take 7.5 mg by mouth at bedtime.     multivitamin-lutein (OCUVITE-LUTEIN) CAPS capsule Take 1 capsule by mouth daily.     mycophenolate  (MYFORTIC ) 180 MG EC tablet Take 360 mg by mouth 2 (two) times daily.     pantoprazole  (PROTONIX ) 40 MG tablet Take 1 tablet (40 mg total) by mouth 2 (two) times daily. 60 tablet 11   TRADJENTA 5 MG TABS tablet Take 5 mg by mouth every morning.     cholecalciferol  (VITAMIN D3) 25 MCG (1000 UNIT) tablet Take 1,000 Units by mouth daily. (Patient not taking: Reported on 07/21/2024)     cyanocobalamin  1000 MCG tablet Take 1,000 mcg by mouth daily. (Patient not taking: Reported on 07/21/2024)     doxycycline  (VIBRA -TABS) 100 MG tablet Take 1 tablet (100 mg total) by mouth 2 (two) times daily. (Patient not taking: Reported on 07/21/2024) 20 tablet 0   escitalopram  (LEXAPRO ) 10 MG tablet Take 10 mg by mouth daily. (Patient not taking: Reported on 07/21/2024)     fluconazole  (DIFLUCAN ) 100 MG tablet Take 2 tablets day 1 Take 1 tablet daily thereafter until gone (Patient not taking: Reported on 07/21/2024) 15 tablet 0   Garlic 100 MG TABS Take  100 mg by mouth daily. (Patient not taking: Reported on 07/21/2024)     hydrALAZINE  (APRESOLINE ) 25 MG tablet Take 25 mg by mouth 2 (two) times daily. (Patient not taking: Reported on 07/21/2024)     HYDROcodone -acetaminophen  (NORCO/VICODIN) 5-325 MG tablet Take 1-2 tablets by mouth every 4 (four) hours as needed. (Patient not taking: Reported on 07/21/2024) 12 tablet 0   methocarbamol  (ROBAXIN ) 500 MG tablet Take 500 mg by mouth every 8 (eight) hours as needed. (Patient not taking: Reported on 07/21/2024)     mupirocin  ointment (BACTROBAN ) 2 % Apply to effected areas bid for 7 days (Patient not taking: Reported on 07/21/2024) 22 g 0   ondansetron  (ZOFRAN ) 8 MG tablet Take 8 mg by mouth  every 8 (eight) hours as needed for nausea, vomiting or refractory nausea / vomiting. (Patient not taking: Reported on 07/21/2024)     predniSONE  (DELTASONE ) 5 MG tablet Take 5 mg by mouth daily.  (Patient not taking: Reported on 07/21/2024)  2   promethazine  (PHENERGAN ) 12.5 MG tablet Take 1 tablet (12.5 mg total) by mouth every 8 (eight) hours as needed for nausea. (Patient not taking: Reported on 07/21/2024) 60 tablet 0   rosuvastatin  (CRESTOR ) 5 MG tablet Take 5 mg by mouth at bedtime. (Patient not taking: Reported on 07/21/2024)     tacrolimus  (PROGRAF ) 1 MG capsule Take 2-3 mg by mouth 2 (two) times daily. Take three capsules in the morning. Take two capsules in the evening. (Patient not taking: Reported on 07/21/2024)     tamoxifen  (NOLVADEX ) 20 MG tablet Take 1 tablet (20 mg total) by mouth daily. (Patient not taking: Reported on 07/21/2024) 30 tablet 5   triamcinolone  ointment (KENALOG ) 0.5 % Apply to effected areas bid prn itching (Patient not taking: Reported on 07/21/2024) 30 g 0   No facility-administered medications prior to visit.    PAST MEDICAL HISTORY: Past Medical History:  Diagnosis Date   Allergy    pollen   Anemia    Anxiety    BRCA2 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 03/01/2021   Breast cancer [facility])    Cataract    surgical repair bilateral   Complication of anesthesia    not herself for 2 days after last surgery (brest lumpectomy)   Coronary artery disease    Depression    ESRD on hemodialysis (HCC)    Home HD 5x per week- not on dialysis now had tramsplant 4/17   Family history of ovarian cancer 02/22/2021   GERD (gastroesophageal reflux disease)    Hearing loss 2017   right ear   History of blood transfusion    transfusion reaction   Hyperlipidemia    Hypertension    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 per pt-dr levy also II; Per patient 08/18/21 she is Type 2   Kidney transplant recipient    Leukocytosis  06/13/2021   Neuromuscular disorder (HCC)    NEUROPATHY   Sleep apnea     PAST SURGICAL HISTORY: Past Surgical History:  Procedure Laterality Date   ABDOMINAL HYSTERECTOMY     BREAST EXCISIONAL BIOPSY Right 08/2015   BREAST LUMPECTOMY WITH RADIOACTIVE SEED LOCALIZATION Right 09/14/2015   Procedure: RIGHT BREAST LUMPECTOMY WITH RADIOACTIVE SEED LOCALIZATION;  Surgeon: Donnice Lima, MD;  Location: [facility];  Service: General;  Laterality: Right;   BREAST LUMPECTOMY WITH RADIOACTIVE SEED LOCALIZATION Left 03/02/2021   Procedure: LEFT BREAST SEED LUMPECTOMY LEFT SENTINEL LYMPH NODE MAPPING;  Surgeon: Vanderbilt Ned, MD;  Location: [facility];  Service: General;  Laterality: Left;  GEN AND PEC BLOCK   BREAST SURGERY Bilateral    biopsy bilateral   CARDIAC CATHETERIZATION     CATARACT EXTRACTION Bilateral    bilateral   CHOLECYSTECTOMY     CYST REMOVAL NECK     DIALYSIS FISTULA CREATION Left    EYE SURGERY Bilateral    lazer   kidney transplant     LEFT HEART CATH AND CORONARY ANGIOGRAPHY N/A 03/04/2019   Procedure: LEFT HEART CATH AND CORONARY ANGIOGRAPHY;  Surgeon: Jordan, Peter M, MD;  Location: [facility] INVASIVE CV LAB;  Service: Cardiovascular;  Laterality: N/A;   LEFT HEART CATHETERIZATION WITH CORONARY ANGIOGRAM N/A 03/30/2014   Procedure: LEFT HEART CATHETERIZATION WITH CORONARY ANGIOGRAM;  Surgeon: Victory LELON Claudene DOUGLAS, MD;  Location: [facility] CATH LAB;  Service: Cardiovascular;  Laterality: N/A;   LIPOMA EXCISION N/A 02/06/2017   Procedure: EXCISION POSTERIOR NECK SEBACEOUS CYST;  Surgeon: Vernetta Berg, MD;  Location: [facility] OR;  Service: General;  Laterality: N/A;   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   Procedure: REPAIR OF ARTERIOVENOUS FISTULA ANEURYSM;  Surgeon: Gaile LELON New, MD;  Location: [facility] OR;  Service: Vascular;  Laterality: Left;   REVISON OF ARTERIOVENOUS FISTULA Left 09/28/2013   Procedure: EXCISE ESCHAR LEFT ARM   ARTERIOVENOUS FISTULA WITH PLICATION OF LEFT ARM ARTERIOVENOUS FISTULA;  Surgeon: Carlin FORBES Haddock, MD;  Location: [facility] OR;  Service: Vascular;  Laterality: Left;   REVISON OF ARTERIOVENOUS FISTULA Left 08/26/2015   Procedure: RESECTION ANEURYSM OF LEFT ARM ARTERIOVENOUS FISTULA  ;  Surgeon: Gaile LELON New, MD;  Location: [facility] OR;  Service: Vascular;  Laterality: Left;   ROBOTIC ASSISTED BILATERAL SALPINGO OOPHERECTOMY N/A 07/11/2022   Procedure: XI ROBOTIC ASSISTED BILATERAL SALPINGO OOPHORECTOMY;  Surgeon: Viktoria Comer SAUNDERS, MD;  Location: [facility];  Service: Gynecology;  Laterality: N/A;   SIMPLE MASTECTOMY WITH AXILLARY SENTINEL NODE BIOPSY Bilateral 08/22/2021   Procedure: BILATERAL SIMPLE MASTECTOMY;  Surgeon: Vanderbilt Ned, MD;  Location: [facility] OR;  Service: General;  Laterality: Bilateral;   TUBAL LIGATION      FAMILY HISTORY: Family History  Problem Relation Age of Onset   Diabetes Mother    Kidney disease Mother    Diabetes Father    Heart disease Father    Kidney disease Father    Hypertension Father    Diabetes Sister    Asthma Sister    Lupus Sister    Diabetes Brother    Diabetes Brother    Diabetes Brother    Diabetes Brother    Cancer Maternal Grandmother 50       ovarian cancer   Ovarian cancer Maternal Grandmother    Diabetes Maternal Grandfather    Diabetes Paternal Grandmother    Colon cancer Neg Hx    Colon polyps Neg Hx    Esophageal cancer Neg Hx    Rectal cancer Neg Hx    Stomach cancer Neg Hx    Breast cancer Neg Hx    Endometrial cancer Neg Hx    Pancreatic cancer Neg Hx    Prostate cancer Neg Hx    Inflammatory bowel disease Neg Hx    Liver disease Neg Hx    Seizures Neg Hx    Stroke Neg Hx     SOCIAL HISTORY: Social History   Socioeconomic History   Marital status: Legally Separated    Spouse name: Not on file   Number of children:  3   Years of education: 9   Highest education level: Not on file  Occupational History   Occupation: disabled   Tobacco Use   Smoking status: Never   Smokeless tobacco: Never  Vaping Use   Vaping status: Never Used  Substance and Sexual Activity   Alcohol use: Not Currently    Comment: Socially   Drug use: No   Sexual activity: Not Currently    Birth control/protection: None, Surgical  Other Topics Concern   Not on file  Social History Narrative   Lives in home with daughters, grand daughter, son-in-law   Caffeine use - 1 cup coffee sometimes and tea    Social Drivers of Health   Tobacco Use: Low Risk (07/21/2024)   Patient History    Smoking Tobacco Use: Never    Smokeless Tobacco Use: Never    Passive Exposure: Not on file  Financial Resource Strain: Not on file  Food Insecurity: No Food Insecurity (10/23/2023)   Hunger Vital Sign    Worried About Running Out of Food in the Last Year: Never true    Ran Out of Food in the Last Year: Never true  Transportation Needs: No Transportation Needs (10/23/2023)   PRAPARE - Administrator, Civil Service (Medical): No    Lack of Transportation (Non-Medical): No  Physical Activity: Not on file  Stress: Not on file  Social Connections: Unknown (11/03/2021)   Received from [facility]   Social Network    Social Network: Not on file  Intimate Partner Violence: Not At Risk (10/23/2023)   Humiliation, Afraid, Rape, and Kick questionnaire    Fear of Current or Ex-Partner: No    Emotionally Abused: No    Physically Abused: No    Sexually Abused: No  Depression (PHQ2-9): Low Risk (06/22/2024)   Depression (PHQ2-9)    PHQ-2 Score: 1  Alcohol Screen: Not on file  Housing: Low Risk (10/23/2023)   Housing Stability Vital Sign    Unable to Pay for Housing in the Last Year: No    Number of Times Moved in the Last Year: 0    Homeless in the Last Year: No  Utilities: Not At Risk (10/23/2023)   AHC Utilities    Threatened with loss of utilities: No  Health Literacy: Not on file     PHYSICAL EXAM  GENERAL EXAM/CONSTITUTIONAL: Vitals:   Vitals:   07/21/24 1515  BP: 134/68  Pulse: 74  SpO2: 96%  Weight: 186 lb (84.4 kg)  Height: 5' 1 (1.549 m)   Body mass index is 35.14 kg/m. Wt Readings from Last 3 Encounters:  07/21/24 186 lb (84.4 kg)  06/22/24 186 lb 12.8 oz (84.7 kg)  12/20/23 164 lb 2 oz (74.4 kg)   Patient is in no distress; well developed, nourished and groomed; neck is supple  CARDIOVASCULAR: Examination of carotid arteries is normal; no carotid bruits Regular rate and rhythm, no murmurs Examination of peripheral vascular system by observation and palpation is normal  EYES: Ophthalmoscopic exam of optic discs and posterior segments is normal; no papilledema or hemorrhages No results found.  MUSCULOSKELETAL: Gait, strength, tone, movements noted in Neurologic exam below  NEUROLOGIC: MENTAL STATUS:     07/21/2024    3:28 PM  MMSE - Mini Mental State Exam  Orientation to time 5  Orientation to Place 5  Registration 3  Attention/ Calculation 1  Recall 1  Language- name 2 objects 2  Language- repeat 0  Language- follow 3 step  command 3  Language- read & follow direction 1  Write a sentence 1  Copy design 1  Total score 23   awake, alert, oriented to person, place and time recent and remote memory intact normal attention and concentration language fluent, comprehension intact, naming intact fund of knowledge appropriate  CRANIAL NERVE:  2nd - no papilledema on fundoscopic exam 2nd, 3rd, 4th, 6th - pupils equal and reactive to light, visual fields full to confrontation, extraocular muscles intact, no nystagmus 5th - facial sensation symmetric 7th - facial strength symmetric 8th - hearing intact 9th - palate elevates symmetrically, uvula midline 11th - shoulder shrug symmetric 12th - tongue protrusion midline  MOTOR:  normal bulk and tone, full strength in the BUE, BLE  SENSORY:  normal and symmetric to light touch, temperature, vibration  COORDINATION:  finger-nose-finger,  fine finger movements normal  REFLEXES:  deep tendon reflexes 1+ and symmetric  GAIT/STATION:  narrow based gait     DIAGNOSTIC DATA (LABS, IMAGING, TESTING) - I reviewed patient records, labs, notes, testing and imaging myself where available.  Lab Results  Component Value Date   WBC 4.7 06/22/2024   HGB 11.3 (L) 06/22/2024   HCT 34.1 (L) 06/22/2024   MCV 86.1 06/22/2024   PLT 189 06/22/2024      Component Value Date/Time   NA 140 06/22/2024 0943   NA 136 02/27/2019 1152   K 5.2 (H) 06/22/2024 0943   CL 106 06/22/2024 0943   CO2 26 06/22/2024 0943   GLUCOSE 107 (H) 06/22/2024 0943   BUN 50 (H) 06/22/2024 0943   BUN 19 02/27/2019 1152   CREATININE 2.52 (H) 06/22/2024 0943   CALCIUM  10.3 06/22/2024 0943   PROT 6.4 (L) 06/22/2024 0943   ALBUMIN  4.0 06/22/2024 0943   AST 15 06/22/2024 0943   ALT 10 06/22/2024 0943   ALKPHOS 98 06/22/2024 0943   BILITOT 0.3 06/22/2024 0943   GFRNONAA 22 (L) 06/22/2024 0943   GFRAA >60 06/06/2019 0250   Lab Results  Component Value Date   CHOL 162 03/29/2014   HDL 31 (L) 03/29/2014   LDLCALC 74 03/29/2014   TRIG 284 (H) 03/29/2014   CHOLHDL 5.2 03/29/2014   Lab Results  Component Value Date   HGBA1C 9.0 (H) 10/23/2023   Lab Results  Component Value Date   VITAMINB12 1,142 (H) 10/22/2023   Lab Results  Component Value Date   TSH 5.800 (H) 03/29/2014    06/01/24 CT head  1. No acute intracranial abnormality. 2. Complete opacification of the maxillary sinuses and moderate ethmoid air cell mucosal thickening.    ASSESSMENT AND PLAN  54 y.o. year old female here with:   Dx:  1. MCI (mild cognitive impairment) with memory loss     PLAN:  MILD MEMORY LOSS (in setting of depression, pain, stress; some insomnia; also OSA not on CPAP; do not suspect dementia) - try to stay active physically and get some exercise (at least 15-30 minutes per day) - eat a nutritious diet with lean protein, plants / vegetables, whole  grains; avoid ultra-processed foods - increase social activities, brain stimulation, games, puzzles, hobbies, crafts, arts, music; try new activities; keep it fun! - aim for at least 7-8 hours sleep per night (or more) - avoid smoking and alcohol - caution with medications, finances, driving - safety / supervision issues reviewed  Return for pending if symptoms worsen or fail to improve, return to PCP.    EDUARD FABIENE HANLON, MD 07/21/2024, 3:42 PM Certified  in Neurology, Neurophysiology and Neuroimaging  [facility] [address] [place], [place] [address] [phone]     [1]  Allergies Allergen Reactions   Latex Hives   Docetaxel  Nausea Only    Diaphoretic.  Patient had hypersensitivity reaction to docetaxel .  See progress note from 05/24/2021.  Patient able to complete infusion.   Norvasc  [Amlodipine ] Swelling   Tape Other (See Comments)    Burns skin.  Please use paper tape only   "

## 2024-07-21 NOTE — Patient Instructions (Signed)
" °  MILD MEMORY LOSS (in setting of depression, pain, stress; some insomnia; also OSA not on CPAP) - try to stay active physically and get some exercise (at least 15-30 minutes per day) - eat a nutritious diet with lean protein, plants / vegetables, whole grains; avoid ultra-processed foods - increase social activities, brain stimulation, games, puzzles, hobbies, crafts, arts, music; try new activities; keep it fun! - aim for at least 7-8 hours sleep per night (or more) - avoid smoking and alcohol - caution with medications, finances, driving - safety / supervision issues reviewed "

## 2024-10-30 ENCOUNTER — Encounter (INDEPENDENT_AMBULATORY_CARE_PROVIDER_SITE_OTHER): Admitting: Ophthalmology

## 2025-06-22 ENCOUNTER — Inpatient Hospital Stay: Admitting: Nurse Practitioner

## 2025-06-22 ENCOUNTER — Inpatient Hospital Stay
